# Patient Record
Sex: Female | Born: 1935
Health system: Southern US, Community
[De-identification: ages and names within clinical notes are randomized; demographics above are authoritative.]

## PROBLEM LIST (undated history)

## (undated) DIAGNOSIS — K5792 Diverticulitis of intestine, part unspecified, without perforation or abscess without bleeding: Secondary | ICD-10-CM

## (undated) DIAGNOSIS — E039 Hypothyroidism, unspecified: Secondary | ICD-10-CM

## (undated) DIAGNOSIS — N321 Vesicointestinal fistula: Secondary | ICD-10-CM

## (undated) DIAGNOSIS — M069 Rheumatoid arthritis, unspecified: Secondary | ICD-10-CM

## (undated) DIAGNOSIS — I350 Nonrheumatic aortic (valve) stenosis: Secondary | ICD-10-CM

## (undated) DIAGNOSIS — K449 Diaphragmatic hernia without obstruction or gangrene: Secondary | ICD-10-CM

## (undated) DIAGNOSIS — K589 Irritable bowel syndrome without diarrhea: Secondary | ICD-10-CM

## (undated) DIAGNOSIS — Z8619 Personal history of other infectious and parasitic diseases: Secondary | ICD-10-CM

## (undated) DIAGNOSIS — M792 Neuralgia and neuritis, unspecified: Secondary | ICD-10-CM

## (undated) DIAGNOSIS — E785 Hyperlipidemia, unspecified: Secondary | ICD-10-CM

## (undated) DIAGNOSIS — R600 Localized edema: Secondary | ICD-10-CM

## (undated) DIAGNOSIS — I4891 Unspecified atrial fibrillation: Secondary | ICD-10-CM

## (undated) DIAGNOSIS — I10 Essential (primary) hypertension: Secondary | ICD-10-CM

## (undated) DIAGNOSIS — D509 Iron deficiency anemia, unspecified: Secondary | ICD-10-CM

## (undated) HISTORY — DX: Hyperlipidemia, unspecified: E78.5

## (undated) HISTORY — DX: Rheumatoid arthritis, unspecified: M06.9

## (undated) HISTORY — DX: Irritable bowel syndrome, unspecified: K58.9

## (undated) HISTORY — PX: CATARACT EXTRACTION: SUR2

## (undated) HISTORY — PX: ENDOSCOPIC VEIN LASER TREATMENT: SHX1508

## (undated) HISTORY — PX: CARPAL TUNNEL RELEASE: SHX101

## (undated) HISTORY — DX: Iron deficiency anemia, unspecified: D50.9

## (undated) HISTORY — DX: Essential (primary) hypertension: I10

## (undated) HISTORY — DX: Diverticulitis of intestine, part unspecified, without perforation or abscess without bleeding: K57.92

## (undated) HISTORY — DX: Neuralgia and neuritis, unspecified: M79.2

## (undated) HISTORY — DX: Vesicointestinal fistula: N32.1

## (undated) HISTORY — DX: Nonrheumatic aortic (valve) stenosis: I35.0

## (undated) HISTORY — DX: Diaphragmatic hernia without obstruction or gangrene: K44.9

## (undated) HISTORY — DX: Personal history of other infectious and parasitic diseases: Z86.19

## (undated) HISTORY — DX: Hypothyroidism, unspecified: E03.9

## (undated) HISTORY — DX: Localized edema: R60.0

## (undated) NOTE — *Deleted (*Deleted)
PROGRESS NOTE    [name]  ZOX:[id number] DOB: 07-13-1927 DOA: 08/12/2020 PCP: Anne Ng, NP (Confirm with patient/family/NH records and if not entered, this HAS to be entered at [facility] point of entry. "No PCP" if truly none.)   Chief Complaint  Patient presents with  . Abdominal Pain    Brief Narrative: (Start on day 1 of progress note - keep it brief and live) ***   Assessment & Plan:   Principal Problem:   Abdominal pain Active Problems:   Aortic stenosis   Hypertension   Persistent atrial fibrillation (HCC)   Benign essential hypertension   Chronic combined systolic and diastolic congestive heart failure (HCC)   ***   DVT prophylaxis: (Lovenox/Heparin/SCD's/anticoagulated/None (if comfort care) Code Status: (Full/Partial - specify details) Family Communication: (Specify name, relationship & date discussed. NO "discussed with patient") Disposition:   Status is: Inpatient  {Inpatient:23812}  Dispo: The patient is from: {From:23814}              Anticipated d/c is to: {To:23815}              Anticipated d/c date is: {Days:23816}              Patient currently {Medically stable:23817}       Consultants:   ***  Procedures: (Don't include imaging studies which can be auto populated. Include things that cannot be auto populated i.e. Echo, Carotid and venous dopplers, Foley, Bipap, HD, tubes/drains, wound vac, central lines etc)  ***  Antimicrobials: (specify start and planned stop date. Auto populated tables are space occupying and do not give end dates)  ***    Subjective: ***  Objective: Vitals:   08/13/20 0400 08/13/20 0436 08/13/20 0825 08/13/20 1202  BP: (!) 163/89 (!) 162/112 140/67 (!) 143/68  Pulse:  100 73 83  Resp: (!) 23 20 18 18   Temp:  98.5 F (36.9 C) 98 F (36.7 C) 98.3 F (36.8 C)  TempSrc:  Oral Oral Oral  SpO2:  99%    Weight:  50.9 kg    Height:  4' 11.02" (1.499 m)      Intake/Output Summary (Last 24  hours) at 08/13/2020 1214 Last data filed at 08/13/2020 0600 Gross per 24 hour  Intake 0 ml  Output -  Net 0 ml   Filed Weights   08/12/20 2120 08/13/20 0436  Weight: 50.9 kg 50.9 kg    Examination:  General exam: Appears calm and comfortable  Respiratory system: Clear to auscultation. Respiratory effort normal. Cardiovascular system: S1 & S2 heard, RRR. No JVD, murmurs, rubs, gallops or clicks. No pedal edema. Gastrointestinal system: Abdomen is nondistended, soft and nontender. No organomegaly or masses felt. Normal bowel sounds heard. Central nervous system: Alert and oriented. No focal neurological deficits. Extremities: Symmetric 5 x 5 power. Skin: No rashes, lesions or ulcers Psychiatry: Judgement and insight appear normal. Mood & affect appropriate.     Data Reviewed: I have personally reviewed following labs and imaging studies  CBC: Recent Labs  Lab 08/07/20 0618 08/12/20 2133 08/13/20 0519  WBC 9.2 13.9* 14.6*  NEUTROABS  --  10.7* 13.0*  HGB 7.9* 9.1* 8.8*  HCT 26.6* 30.6* 29.4*  MCV 82.9 82.0 81.2  PLT 279 338 309    Basic Metabolic Panel: Recent Labs  Lab 08/07/20 0618 08/12/20 2133 08/13/20 0519  NA 139 138 139  K 4.3 4.2 4.6  CL 107 106 104  CO2 22 23 24   GLUCOSE 91 141* 176*  BUN 18 27* 25*  CREATININE 1.05* 1.08* 1.02*  CALCIUM 8.8* 8.9 8.8*    GFR: Estimated Creatinine Clearance: 24.5 mL/min (A) (by C-G formula based on SCr of 1.02 mg/dL (H)).  Liver Function Tests: Recent Labs  Lab 08/12/20 2133 08/13/20 0519  AST 38 38  ALT 22 22  ALKPHOS 87 85  BILITOT 0.6 0.7  PROT 7.8 7.5  ALBUMIN 3.6 3.5    CBG: Recent Labs  Lab 08/07/20 0731 08/12/20 2325 08/13/20 1159  GLUCAP 109* 123* 116*     Recent Results (from the past 240 hour(s))  Urine culture     Status: Abnormal   Collection Time: 08/04/20 11:19 AM   Specimen: Urine, Catheterized  Result Value Ref Range Status   Specimen Description   Final    URINE,  CATHETERIZED Performed at [facility], [address] [place], [place] [address]    Special Requests   Final    Normal Performed at [facility], [address] [place], [place] [address]    Culture >=100,000 COLONIES/mL KLEBSIELLA OXYTOCA (A)  Final   Report Status 08/06/2020 FINAL  Final   Organism ID, Bacteria KLEBSIELLA OXYTOCA (A)  Final      Susceptibility   Klebsiella oxytoca - MIC*    AMPICILLIN >=32 RESISTANT Resistant     CEFAZOLIN 8 SENSITIVE Sensitive     CEFTRIAXONE <=0.25 SENSITIVE Sensitive     CIPROFLOXACIN <=0.25 SENSITIVE Sensitive     GENTAMICIN <=1 SENSITIVE Sensitive     IMIPENEM <=0.25 SENSITIVE Sensitive     NITROFURANTOIN 32 SENSITIVE Sensitive     TRIMETH/SULFA <=20 SENSITIVE Sensitive     AMPICILLIN/SULBACTAM 8 SENSITIVE Sensitive     PIP/TAZO <=4 SENSITIVE Sensitive     * >=100,000 COLONIES/mL KLEBSIELLA OXYTOCA  Respiratory Panel by RT PCR (Flu A&B, Covid) - Nasopharyngeal Swab     Status: None   Collection Time: 08/04/20  2:58 PM   Specimen: Nasopharyngeal Swab  Result Value Ref Range Status   SARS Coronavirus 2 by RT PCR NEGATIVE NEGATIVE Final    Comment: (NOTE) SARS-CoV-2 target nucleic acids are NOT DETECTED.  The SARS-CoV-2 RNA is generally detectable in upper respiratoy specimens during the acute phase of infection. The lowest concentration of SARS-CoV-2 viral copies this assay can detect is 131 copies/mL. A negative result does not preclude SARS-Cov-2 infection and should not be used as the sole basis for treatment or other patient management decisions. A negative result may occur with  improper specimen collection/handling, submission of specimen other than nasopharyngeal swab, presence of viral mutation(s) within the areas targeted by this assay, and inadequate number of viral copies (<131 copies/mL). A negative result must be combined with clinical observations, patient history, and  epidemiological information. The expected result is Negative.  Fact Sheet for Patients:  https://www.moore.com/  Fact Sheet for Healthcare Providers:  https://www.young.biz/  This test is no t yet approved or cleared by the [place] FDA and  has been authorized for detection and/or diagnosis of SARS-CoV-2 by FDA under an Emergency Use Authorization (EUA). This EUA will remain  in effect (meaning this test can be used) for the duration of the COVID-19 declaration under Section 564(b)(1) of the Act, 21 U.S.C. section 360bbb-3(b)(1), unless the authorization is terminated or revoked sooner.     Influenza A by PCR NEGATIVE NEGATIVE Final   Influenza B by PCR NEGATIVE NEGATIVE Final    Comment: (NOTE) The Xpert Xpress SARS-CoV-2/FLU/RSV assay is intended as an aid  in  the diagnosis of influenza from Nasopharyngeal swab specimens and  should not be used as a sole basis for treatment. Nasal washings and  aspirates are unacceptable for Xpert Xpress SARS-CoV-2/FLU/RSV  testing.  Fact Sheet for Patients: https://www.moore.com/  Fact Sheet for Healthcare Providers: https://www.young.biz/  This test is not yet approved or cleared by the [place] FDA and  has been authorized for detection and/or diagnosis of SARS-CoV-2 by  FDA under an Emergency Use Authorization (EUA). This EUA will remain  in effect (meaning this test can be used) for the duration of the  Covid-19 declaration under Section 564(b)(1) of the Act, 21  U.S.C. section 360bbb-3(b)(1), unless the authorization is  terminated or revoked. Performed at [facility], [address] [place], [place] [address]   Respiratory Panel by RT PCR (Flu A&B, Covid) - Nasopharyngeal Swab     Status: None   Collection Time: 08/13/20 12:12 AM   Specimen: Nasopharyngeal Swab  Result Value Ref Range Status   SARS Coronavirus 2 by RT PCR  NEGATIVE NEGATIVE Final    Comment: (NOTE) SARS-CoV-2 target nucleic acids are NOT DETECTED.  The SARS-CoV-2 RNA is generally detectable in upper respiratoy specimens during the acute phase of infection. The lowest concentration of SARS-CoV-2 viral copies this assay can detect is 131 copies/mL. A negative result does not preclude SARS-Cov-2 infection and should not be used as the sole basis for treatment or other patient management decisions. A negative result may occur with  improper specimen collection/handling, submission of specimen other than nasopharyngeal swab, presence of viral mutation(s) within the areas targeted by this assay, and inadequate number of viral copies (<131 copies/mL). A negative result must be combined with clinical observations, patient history, and epidemiological information. The expected result is Negative.  Fact Sheet for Patients:  https://www.moore.com/  Fact Sheet for Healthcare Providers:  https://www.young.biz/  This test is no t yet approved or cleared by the [place] FDA and  has been authorized for detection and/or diagnosis of SARS-CoV-2 by FDA under an Emergency Use Authorization (EUA). This EUA will remain  in effect (meaning this test can be used) for the duration of the COVID-19 declaration under Section 564(b)(1) of the Act, 21 U.S.C. section 360bbb-3(b)(1), unless the authorization is terminated or revoked sooner.     Influenza A by PCR NEGATIVE NEGATIVE Final   Influenza B by PCR NEGATIVE NEGATIVE Final    Comment: (NOTE) The Xpert Xpress SARS-CoV-2/FLU/RSV assay is intended as an aid in  the diagnosis of influenza from Nasopharyngeal swab specimens and  should not be used as a sole basis for treatment. Nasal washings and  aspirates are unacceptable for Xpert Xpress SARS-CoV-2/FLU/RSV  testing.  Fact Sheet for Patients: https://www.moore.com/  Fact Sheet for  Healthcare Providers: https://www.young.biz/  This test is not yet approved or cleared by the [place] FDA and  has been authorized for detection and/or diagnosis of SARS-CoV-2 by  FDA under an Emergency Use Authorization (EUA). This EUA will remain  in effect (meaning this test can be used) for the duration of the  Covid-19 declaration under Section 564(b)(1) of the Act, 21  U.S.C. section 360bbb-3(b)(1), unless the authorization is  terminated or revoked. Performed at [facility], [address], [place], [place] [address]          Radiology Studies: CT ABDOMEN PELVIS WO CONTRAST  Result Date: 08/12/2020 CLINICAL DATA:  Epigastric pain which began after eating dinner EXAM: CT ABDOMEN AND PELVIS WITHOUT  CONTRAST TECHNIQUE: Multidetector CT imaging of the abdomen and pelvis was performed following the standard protocol without IV contrast. COMPARISON:  None. FINDINGS: Lower chest: Trace bilateral effusions with some adjacent atelectatic change. Lung bases otherwise clear. Cardiomegaly with predominantly right heart enlargement and extensive coronary artery calcifications. Trace pericardial fluid is likely within physiologic normal. Hypoattenuation of the cardiac blood pool compatible with a mild anemia. Hepatobiliary: No visible focal liver lesion on this unenhanced CT. Normal gallbladder and biliary tree. No gallbladder wall thickening, pericholecystic inflammation or visible calcified gallstones. Pancreas: No pancreatic ductal dilatation or surrounding inflammatory changes. Spleen: Normal in size. No concerning splenic lesions. Adrenals/Urinary Tract: Normal adrenal glands. Persistent left hydronephrosis and ureterectasis to the level of the pelvis in the vicinity of the extensive inflammatory change seen in the left lower quadrant suggestive of a possible inflammatory stricture. No right urinary tract dilatation is seen. No focal renal lesions are  evident. Mild bilateral perinephric stranding is similar to prior and nonspecific given age and diminished renal function as well as inflammatory changes in pelvis. The urinary bladder remains circumferentially though asymmetrically thickened with loss of discernible fat plane between the left superolateral bladder dome and adjacent: And a small tract of radiolucent gas compatible with colovesicular fistula. Anti dependently layering gas is again present within the bladder lumen. Margins of the posterolateral bladder also difficult to discern from the adjacent left adnexal structures as well. Stomach/Bowel: Large fluid-filled hiatal hernia, increasing distension from comparison. Some mild thickening and stranding is noted in the lower mediastinum. No free mediastinal air or fluid. Distal stomach is more unremarkable. No focal small bowel thickening or dilatation. Diffuse pancolonic diverticulosis is seen. There is redemonstration of a focal segmental thickening of the proximal to mid sigmoid colon with surrounding inflammatory phlegmon and colovesicular fistulization as detailed above. Additionally, there is persistent loss of discernible fat plane between this phlegmonous region and the multilobulated cystic structure in the left adnexa concerning for coloovarian fistula and concomitant tubo-ovarian abscess. This collection measures approximately 4.6 x 6.1, not significantly changed in size when measured at similar levels to comparison. No new fluid collections or abscesses are seen. Vascular/Lymphatic: Atherosclerotic calcifications within the abdominal aorta and branch vessels. No aneurysm or ectasia. No enlarged abdominopelvic lymph nodes. Reactive adenopathy in the deep pelvis. Reproductive: Anteverted uterus deviated slightly rightward. Inflammatory changes of the left adnexa with possible colo ovarian fistula/tubo-ovarian abscess as described above. The right ovary is less well visualized given several  adjacent collapsed small bowel loops but appears relatively quiescent. Other: Inflammatory changes and phlegmon centered in the left lower quadrant with trace amount of reactive free fluid in the deep pelvis. Mild body wall edema is present as well. Fat containing umbilical and bilateral inguinal hernias. Some focal skin thickening and soft tissue stranding superficial to the sacrum. Musculoskeletal: The osseous structures appear diffusely demineralized which may limit detection of small or nondisplaced fractures. No acute osseous abnormality or suspicious osseous lesion. Multilevel degenerative changes are present in the imaged portions of the spine. Levocurvature of the thoracolumbar spine, apex L2. Chronic anterior wedging at L2 may be degenerative or physiologic in nature. Additional degenerative changes in the hips and pelvis. IMPRESSION: 1. Redemonstration of a focal segmental thickening of the proximal to mid sigmoid colon with surrounding inflammatory phlegmon compatible with a chronic diverticulitis with demonstrable colovesicular fistula. Contiguous with the inflammation is a stable multilobulated cystic structure in the left adnexa concerning for coloovarian fistula and concomitant tubo-ovarian abscess as well. 2. More circumferential  thickening of the bladder wall likely reflecting a concomitant chronic cystitis. 3. Persistent left hydronephrosis and ureterectasis to the level of the pelvis in the vicinity of the extensive inflammatory change seen in the left lower quadrant suggestive of a possible inflammatory stricture. 4. Large fluid-filled hiatal hernia, increasing distension from comparison. Some mild thickening and stranding is noted in the lower mediastinum. Could reflect inflammation of the hiatal hernia and or esophagitis. Recommend correlation with clinical features given the acute presentation. Consider direct visualization as clinically warranted. 5. Trace bilateral effusions with some  adjacent atelectatic change. 6. Cardiomegaly with predominantly right heart enlargement and extensive coronary artery calcifications. Some features of mild anasarca as well with body wall edema. 7. Focal skin thickening superficial to the sacrum concerning for developing sacral decubitus ulceration. Recommend correlation with direct visualization. No subjacent evidence of osteomyelitis. 8. Hypoattenuation of the cardiac blood pool compatible with a mild anemia. 9. Aortic Atherosclerosis (ICD10-I70.0). Electronically Signed   By: Kreg Shropshire M.D.   On: 08/12/2020 23:42   DG Chest 2 View  Result Date: 08/12/2020 CLINICAL DATA:  Epigastric pain EXAM: CHEST - 2 VIEW COMPARISON:  04/21/2020 FINDINGS: Stable cardiomediastinal contours. Moderate-sized hiatal hernia. Atherosclerotic calcification of the aortic knob. No focal airspace consolidation, pleural effusion, or pneumothorax. Exaggerated thoracic kyphosis. IMPRESSION: No active cardiopulmonary disease. Electronically Signed   By: Duanne Guess D.O.   On: 08/12/2020 21:56        Scheduled Meds: . fentaNYL (SUBLIMAZE) injection  25 mcg Intravenous Once  . levothyroxine  37.5 mcg Intravenous Daily  . metoprolol tartrate  2.5 mg Intravenous Q6H  . pantoprazole (PROTONIX) IV  40 mg Intravenous Q12H   Continuous Infusions: . dextrose 5 % and 0.9% NaCl 75 mL/hr at 08/13/20 1007  . meropenem (MERREM) IV 500 mg (08/13/20 0547)     LOS: 0 days    Time spent: ***    Ramiro Harvest, MD [facility]   To contact the attending provider between 7A-7P or the covering provider during after hours 7P-7A, please log into the web site [email or web address] and access using universal [place] password for that web site. If you do not have the password, please call the hospital operator.  08/13/2020, 12:14 PM

---
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
## 1998-09-13 ENCOUNTER — Other Ambulatory Visit: Admission: RE | Admit: 1998-09-13 | Discharge: 1998-09-13 | Payer: Self-pay | Admitting: Family Medicine

## 1999-03-18 ENCOUNTER — Other Ambulatory Visit: Admission: RE | Admit: 1999-03-18 | Discharge: 1999-03-18 | Payer: Self-pay | Admitting: Obstetrics and Gynecology

## 2001-02-07 ENCOUNTER — Other Ambulatory Visit: Admission: RE | Admit: 2001-02-07 | Discharge: 2001-02-07 | Payer: Self-pay | Admitting: Family Medicine

## 2001-10-30 HISTORY — PX: FOOT SURGERY: SHX648

## 2003-10-31 HISTORY — PX: EYE SURGERY: SHX253

## 2004-01-19 ENCOUNTER — Other Ambulatory Visit: Admission: RE | Admit: 2004-01-19 | Discharge: 2004-01-19 | Payer: Self-pay | Admitting: Family Medicine

## 2008-07-24 ENCOUNTER — Encounter: Admission: RE | Admit: 2008-07-24 | Discharge: 2008-07-24 | Payer: Self-pay | Admitting: Family Medicine

## 2008-08-13 ENCOUNTER — Other Ambulatory Visit: Admission: RE | Admit: 2008-08-13 | Discharge: 2008-08-13 | Payer: Self-pay | Admitting: Interventional Radiology

## 2008-08-13 ENCOUNTER — Encounter (INDEPENDENT_AMBULATORY_CARE_PROVIDER_SITE_OTHER): Payer: Self-pay | Admitting: Interventional Radiology

## 2008-08-13 ENCOUNTER — Encounter: Admission: RE | Admit: 2008-08-13 | Discharge: 2008-08-13 | Payer: Self-pay | Admitting: Family Medicine

## 2009-08-19 ENCOUNTER — Emergency Department (HOSPITAL_COMMUNITY): Admission: EM | Admit: 2009-08-19 | Discharge: 2009-08-19 | Payer: Self-pay | Admitting: Emergency Medicine

## 2010-08-26 ENCOUNTER — Ambulatory Visit: Payer: Self-pay | Admitting: Cardiovascular Disease

## 2010-11-20 ENCOUNTER — Encounter: Payer: Self-pay | Admitting: Family Medicine

## 2011-02-02 LAB — BASIC METABOLIC PANEL
BUN: 18 mg/dL (ref 6–23)
CO2: 29 mEq/L (ref 19–32)
Calcium: 9.7 mg/dL (ref 8.4–10.5)
Chloride: 100 mEq/L (ref 96–112)
Creatinine, Ser: 1.24 mg/dL — ABNORMAL HIGH (ref 0.4–1.2)
GFR calc Af Amer: 50 mL/min — ABNORMAL LOW (ref >60)
GFR calc non Af Amer: 42 mL/min — ABNORMAL LOW (ref >60)
Glucose, Bld: 109 mg/dL — ABNORMAL HIGH (ref 70–99)
Potassium: 3.6 mEq/L (ref 3.5–5.1)
Sodium: 137 mEq/L (ref 135–145)

## 2011-02-02 LAB — DIFFERENTIAL
Basophils Absolute: 0 10*3/uL (ref 0.0–0.1)
Basophils Relative: 0 % (ref 0–1)
Eosinophils Absolute: 0.2 10*3/uL (ref 0.0–0.7)
Eosinophils Relative: 2 % (ref 0–5)
Lymphocytes Relative: 23 % (ref 12–46)
Lymphs Abs: 2.4 10*3/uL (ref 0.7–4.0)
Monocytes Absolute: 0.7 10*3/uL (ref 0.1–1.0)
Monocytes Relative: 6 % (ref 3–12)
Neutro Abs: 7.2 10*3/uL (ref 1.7–7.7)
Neutrophils Relative %: 69 % (ref 43–77)

## 2011-02-02 LAB — CBC
HCT: 41.2 % (ref 36.0–46.0)
Hemoglobin: 14.2 g/dL (ref 12.0–15.0)
MCHC: 34.6 g/dL (ref 30.0–36.0)
MCV: 95.5 fL (ref 78.0–100.0)
Platelets: 283 10*3/uL (ref 150–400)
RBC: 4.31 MIL/uL (ref 3.87–5.11)
RDW: 13.6 % (ref 11.5–15.5)
WBC: 10.5 10*3/uL (ref 4.0–10.5)

## 2011-02-02 LAB — PROTIME-INR
INR: 0.92 (ref 0.00–1.49)
Prothrombin Time: 12.3 seconds (ref 11.6–15.2)

## 2011-02-02 LAB — APTT: aPTT: 26 seconds (ref 24–37)

## 2011-02-17 ENCOUNTER — Other Ambulatory Visit: Payer: Self-pay | Admitting: *Deleted

## 2011-02-17 ENCOUNTER — Encounter: Payer: Self-pay | Admitting: Cardiovascular Disease

## 2011-02-17 DIAGNOSIS — E7800 Pure hypercholesterolemia, unspecified: Secondary | ICD-10-CM

## 2011-02-21 ENCOUNTER — Other Ambulatory Visit: Payer: Self-pay | Admitting: *Deleted

## 2011-02-21 ENCOUNTER — Ambulatory Visit: Payer: Self-pay | Admitting: Cardiovascular Disease

## 2011-02-21 ENCOUNTER — Telehealth: Payer: Self-pay | Admitting: *Deleted

## 2011-02-22 NOTE — Telephone Encounter (Signed)
Error open

## 2011-03-20 ENCOUNTER — Encounter: Payer: Self-pay | Admitting: Cardiovascular Disease

## 2011-03-22 ENCOUNTER — Encounter: Payer: Self-pay | Admitting: Cardiovascular Disease

## 2011-03-22 ENCOUNTER — Other Ambulatory Visit (INDEPENDENT_AMBULATORY_CARE_PROVIDER_SITE_OTHER): Payer: Medicare Other | Admitting: *Deleted

## 2011-03-22 ENCOUNTER — Ambulatory Visit (INDEPENDENT_AMBULATORY_CARE_PROVIDER_SITE_OTHER): Payer: Medicare Other | Admitting: Cardiovascular Disease

## 2011-03-22 ENCOUNTER — Other Ambulatory Visit (INDEPENDENT_AMBULATORY_CARE_PROVIDER_SITE_OTHER): Payer: Medicare Other | Admitting: Cardiology

## 2011-03-22 ENCOUNTER — Other Ambulatory Visit: Payer: Self-pay | Admitting: Cardiovascular Disease

## 2011-03-22 VITALS — BP 160/80 | HR 64 | Ht 61.0 in | Wt 154.8 lb

## 2011-03-22 DIAGNOSIS — I359 Nonrheumatic aortic valve disorder, unspecified: Secondary | ICD-10-CM

## 2011-03-22 DIAGNOSIS — I10 Essential (primary) hypertension: Secondary | ICD-10-CM

## 2011-03-22 DIAGNOSIS — I350 Nonrheumatic aortic (valve) stenosis: Secondary | ICD-10-CM

## 2011-03-22 DIAGNOSIS — E7800 Pure hypercholesterolemia, unspecified: Secondary | ICD-10-CM

## 2011-03-22 DIAGNOSIS — Z13220 Encounter for screening for lipoid disorders: Secondary | ICD-10-CM

## 2011-03-22 LAB — LDL CHOLESTEROL, DIRECT: Direct LDL: 164.2 mg/dL

## 2011-03-22 LAB — HEPATIC FUNCTION PANEL
ALT: 14 U/L (ref 0–35)
AST: 27 U/L (ref 0–37)
Albumin: 3.6 g/dL (ref 3.5–5.2)
Alkaline Phosphatase: 65 U/L (ref 39–117)
Bilirubin, Direct: 0.1 mg/dL (ref 0.0–0.3)
Total Bilirubin: 0.9 mg/dL (ref 0.3–1.2)
Total Protein: 7.1 g/dL (ref 6.0–8.3)

## 2011-03-22 LAB — LIPID PANEL
Cholesterol: 237 mg/dL — ABNORMAL HIGH (ref 0–200)
HDL: 74.8 mg/dL (ref >39.00)
Total CHOL/HDL Ratio: 3
Triglycerides: 109 mg/dL (ref 0.0–149.0)
VLDL: 21.8 mg/dL (ref 0.0–40.0)

## 2011-03-22 LAB — BASIC METABOLIC PANEL
BUN: 22 mg/dL (ref 6–23)
CO2: 29 mEq/L (ref 19–32)
Calcium: 9.4 mg/dL (ref 8.4–10.5)
Chloride: 100 mEq/L (ref 96–112)
Creatinine, Ser: 1.3 mg/dL — ABNORMAL HIGH (ref 0.4–1.2)
GFR: 43.64 mL/min — ABNORMAL LOW (ref >60.00)
Glucose, Bld: 111 mg/dL — ABNORMAL HIGH (ref 70–99)
Potassium: 4 mEq/L (ref 3.5–5.1)
Sodium: 138 mEq/L (ref 135–145)

## 2011-03-22 MED ORDER — DILTIAZEM HCL ER COATED BEADS 180 MG PO CP24
180.0000 mg | ORAL_CAPSULE | Freq: Every day | ORAL | Status: DC
Start: 2011-03-22 — End: 2012-01-31

## 2011-03-22 MED ORDER — LOSARTAN POTASSIUM-HCTZ 100-12.5 MG PO TABS: 1.0000 | ORAL_TABLET | Freq: Every day | ORAL | Status: DC

## 2011-03-22 NOTE — Telephone Encounter (Signed)
Pt called she said she was here this morning and wanted a 30 day supply of Hyzaar she gets generic. Please call to [facility] on [address]. She usually gets at [place] but cannot get there to pick up

## 2011-03-22 NOTE — Assessment & Plan Note (Signed)
Her blood pressure remains elevated. We will try diltiazem slow release 180 mg a day. This should help with her hypertension and will help keep her heart rate under control. I'll see her again in 3 months for followup visit.

## 2011-03-22 NOTE — Progress Notes (Signed)
[name] Date of Birth  1927/05/01 [facility] [address]     [address] [place], [place]  [address] [phone]  Fax  [phone]  History of Present Illness:  77 yo with history of  HTN and AS.  Under lots of stress with her husband who has dementia.  He recently fell and had a subdural hematoma.  She is very upset about his condition.   She has no symptoms.  Current Outpatient Prescriptions on File Prior to Visit  Medication Sig Dispense Refill  . Calcium Carbonate (CALCARB 600 PO) Take 3 tablets by mouth daily.        . cholecalciferol (VITAMIN D) 1000 UNITS tablet Take 2,000 Units by mouth daily.        Marland Kitchen ezetimibe-simvastatin (VYTORIN) 10-40 MG per tablet Take 1 tablet by mouth at bedtime.        Marland Kitchen levothyroxine (SYNTHROID, LEVOTHROID) 75 MCG tablet Take 75 mcg by mouth daily.        Marland Kitchen losartan-hydrochlorothiazide (HYZAAR) 100-12.5 MG per tablet Take 1 tablet by mouth daily.        . Omega-3 Fatty Acids (FISH OIL) 1200 MG CAPS Take by mouth 3 (three) times daily.          Allergies  Allergen Reactions  . Aspirin Nausea And Vomiting  . Codeine   . Penicillins Rash    Past Medical History  Diagnosis Date  . HTN (hypertension)   . HLD (hyperlipidemia)   . Hypothyroidism   . Aortic stenosis, mild     Past Surgical History  Procedure Date  . Appendectomy 1955  . Foot surgery 2003    bilateral    History  Smoking status  . Former Smoker  . Quit date: 10/31/1983  Smokeless tobacco  . Not on file    History  Alcohol Use No    Family History  Problem Relation Age of Onset  . Coronary artery disease    . Peripheral vascular disease    . Heart attack      Reviw of Systems:  Reviewed in the HPI.  All other systems are negative.  Physical Exam: BP 160/80  Pulse 64  Ht 5\' 1"  (1.549 m)  Wt 154 lb 12.8 oz (70.217 kg)  BMI 29.25 kg/m2 The patient is alert and oriented x 3.  The mood and affect are normal.   The skin is warm and dry.  Color is normal.  The HEENT exam reveals that the sclera are nonicteric.  The mucous membranes are moist.  The carotids are 2+ without bruits.  There is no thyromegaly.  There is no JVD.  The lungs are clear.  The chest wall is non tender.  The heart exam reveals a regular rate with a normal S1 and S2. There is a soft systolic murmur.  The PMI is not displaced.   Abdominal exam reveals good bowel sounds.  There is no guarding or rebound.  There is no hepatosplenomegaly or tenderness.  There are no masses.  There is trace edema - especially on the left side.  The legs are without rashes.  There are superficial spider veins and varicose veins.  The distal pulses are intact.  Cranial nerves II - XII are intact.  Motor and sensory functions are intact.  The gait is normal.  ECG:  Assessment / Plan:

## 2011-03-22 NOTE — Assessment & Plan Note (Signed)
Mrs. [name] is doing very well from a cardiac standpoint. She has not had any episodes of shortness of breath related to her aortic stenosis. She also denies any chest pain or syncope. We'll continue to follow her clinically and will repeat an echo in the next year or so.

## 2011-03-22 NOTE — Telephone Encounter (Signed)
Patient request refill. Jodette Ryna Beckstrom RN  

## 2011-03-23 ENCOUNTER — Telehealth: Payer: Self-pay | Admitting: Cardiovascular Disease

## 2011-03-23 NOTE — Telephone Encounter (Signed)
Was done yesterday. Pt didn't recognize generic name.Alfonso Ramus RN

## 2011-03-23 NOTE — Telephone Encounter (Signed)
Wants Rx for Hyzaar called in to [facility] on [address]

## 2011-03-29 ENCOUNTER — Telehealth: Payer: Self-pay | Admitting: *Deleted

## 2011-03-29 NOTE — Telephone Encounter (Signed)
Message copied by Antony Odea on Wed Mar 29, 2011 11:08 AM ------      Message from: Vesta Mixer      Created: Tue Mar 28, 2011 12:58 PM       Will she consider atorvastatin 20 mg a day in addition to the zetia.  Recheck lipids, bmp, hfp in 6 months

## 2011-03-29 NOTE — Telephone Encounter (Signed)
Pt declines adding atorvastatin at this time, has tried in past and hurts her stomach, wants to continue to try diet and exercise. Alfonso Ramus RN

## 2011-06-01 ENCOUNTER — Ambulatory Visit: Payer: Medicare Other | Admitting: Cardiovascular Disease

## 2011-09-15 ENCOUNTER — Ambulatory Visit: Payer: Medicare Other | Admitting: Cardiovascular Disease

## 2011-10-17 ENCOUNTER — Ambulatory Visit: Payer: Medicare Other | Admitting: Cardiovascular Disease

## 2011-11-03 DIAGNOSIS — K589 Irritable bowel syndrome without diarrhea: Secondary | ICD-10-CM | POA: Diagnosis not present

## 2011-11-03 DIAGNOSIS — F411 Generalized anxiety disorder: Secondary | ICD-10-CM | POA: Diagnosis not present

## 2011-11-03 DIAGNOSIS — K219 Gastro-esophageal reflux disease without esophagitis: Secondary | ICD-10-CM | POA: Diagnosis not present

## 2011-11-03 DIAGNOSIS — K2970 Gastritis, unspecified, without bleeding: Secondary | ICD-10-CM | POA: Diagnosis not present

## 2011-11-03 DIAGNOSIS — K2990 Gastroduodenitis, unspecified, without bleeding: Secondary | ICD-10-CM | POA: Diagnosis not present

## 2011-11-05 DIAGNOSIS — F419 Anxiety disorder, unspecified: Secondary | ICD-10-CM | POA: Insufficient documentation

## 2011-11-16 DIAGNOSIS — M25519 Pain in unspecified shoulder: Secondary | ICD-10-CM | POA: Diagnosis not present

## 2011-11-16 DIAGNOSIS — M67919 Unspecified disorder of synovium and tendon, unspecified shoulder: Secondary | ICD-10-CM | POA: Diagnosis not present

## 2011-11-16 DIAGNOSIS — M719 Bursopathy, unspecified: Secondary | ICD-10-CM | POA: Diagnosis not present

## 2011-11-16 DIAGNOSIS — M1710 Unilateral primary osteoarthritis, unspecified knee: Secondary | ICD-10-CM | POA: Diagnosis not present

## 2011-11-16 DIAGNOSIS — G5600 Carpal tunnel syndrome, unspecified upper limb: Secondary | ICD-10-CM | POA: Diagnosis not present

## 2011-11-20 ENCOUNTER — Telehealth: Payer: Self-pay | Admitting: Cardiovascular Disease

## 2011-11-20 NOTE — Telephone Encounter (Signed)
Pt calling re making appt to see nahser re swelling in leg, refused to see lori, nahser booked further than she wants to wait, pls advise

## 2011-11-21 NOTE — Telephone Encounter (Signed)
Spoke with pt, c/o l leg swelling for a long time but is worsening despite wearing hose. Pt husband just died and c/o being up more unable to elevate. Advised to call pcp to get into sooner. In the mean time reduce sodium, elevate when able and contact pcp soon, agreed to plan and to call back if she doesn't get assistance with pcp.

## 2011-12-04 DIAGNOSIS — Z111 Encounter for screening for respiratory tuberculosis: Secondary | ICD-10-CM | POA: Diagnosis not present

## 2011-12-21 DIAGNOSIS — Z79899 Other long term (current) drug therapy: Secondary | ICD-10-CM | POA: Diagnosis not present

## 2011-12-21 DIAGNOSIS — Z0000 Encounter for general adult medical examination without abnormal findings: Secondary | ICD-10-CM | POA: Diagnosis not present

## 2011-12-21 DIAGNOSIS — E039 Hypothyroidism, unspecified: Secondary | ICD-10-CM | POA: Diagnosis not present

## 2011-12-21 DIAGNOSIS — I10 Essential (primary) hypertension: Secondary | ICD-10-CM | POA: Diagnosis not present

## 2011-12-21 DIAGNOSIS — E559 Vitamin D deficiency, unspecified: Secondary | ICD-10-CM | POA: Diagnosis not present

## 2012-01-15 DIAGNOSIS — Z0000 Encounter for general adult medical examination without abnormal findings: Secondary | ICD-10-CM | POA: Diagnosis not present

## 2012-01-15 DIAGNOSIS — R002 Palpitations: Secondary | ICD-10-CM | POA: Diagnosis not present

## 2012-01-15 DIAGNOSIS — R609 Edema, unspecified: Secondary | ICD-10-CM | POA: Diagnosis not present

## 2012-01-15 DIAGNOSIS — I10 Essential (primary) hypertension: Secondary | ICD-10-CM | POA: Diagnosis not present

## 2012-01-15 DIAGNOSIS — F4321 Adjustment disorder with depressed mood: Secondary | ICD-10-CM | POA: Diagnosis not present

## 2012-01-17 ENCOUNTER — Ambulatory Visit (INDEPENDENT_AMBULATORY_CARE_PROVIDER_SITE_OTHER): Payer: Medicare Other | Admitting: Cardiovascular Disease

## 2012-01-17 ENCOUNTER — Encounter: Payer: Self-pay | Admitting: Cardiovascular Disease

## 2012-01-17 VITALS — BP 158/78 | HR 63 | Ht 61.0 in | Wt 149.8 lb

## 2012-01-17 DIAGNOSIS — I359 Nonrheumatic aortic valve disorder, unspecified: Secondary | ICD-10-CM | POA: Diagnosis not present

## 2012-01-17 DIAGNOSIS — R609 Edema, unspecified: Secondary | ICD-10-CM | POA: Diagnosis not present

## 2012-01-17 DIAGNOSIS — I10 Essential (primary) hypertension: Secondary | ICD-10-CM | POA: Diagnosis not present

## 2012-01-17 DIAGNOSIS — I350 Nonrheumatic aortic (valve) stenosis: Secondary | ICD-10-CM

## 2012-01-17 DIAGNOSIS — R600 Localized edema: Secondary | ICD-10-CM

## 2012-01-17 MED ORDER — VALSARTAN-HYDROCHLOROTHIAZIDE 320-25 MG PO TABS: 1.0000 | ORAL_TABLET | Freq: Every day | ORAL | Status: DC

## 2012-01-17 NOTE — Assessment & Plan Note (Signed)
Her aortic stenosis is stable.

## 2012-01-17 NOTE — Progress Notes (Signed)
[name] Date of Birth  07-26-1927 [facility] / [facility] [address]     [address] [place], [place]  [address] [phone]  Fax  [phone]  Problem list: 1. Hypertension 2. Hyperlipidemia 3. Mild aortic stenosis 4. Hypothyroidism 5. Leg edema-left greater than right  History of Present Illness:  78 yo with history of  HTN and AS.  She's been trying to avoid eating salt. Her husband died this past year. She had been spending a lot of time with him through the last year some of his life. He had severe dementia and was declining throughout the past year.  She has remained active. She's not having episodes of chest pain or shortness of breath.  Current Outpatient Prescriptions on File Prior to Visit  Medication Sig Dispense Refill  . Calcium Carbonate (CALCARB 600 PO) Take 3 tablets by mouth daily.        . cholecalciferol (VITAMIN D) 1000 UNITS tablet Take 2,000 Units by mouth daily.        Marland Kitchen ezetimibe-simvastatin (VYTORIN) 10-40 MG per tablet Take 1 tablet by mouth at bedtime.        Marland Kitchen levothyroxine (SYNTHROID, LEVOTHROID) 75 MCG tablet Take 75 mcg by mouth daily.        Marland Kitchen losartan-hydrochlorothiazide (HYZAAR) 100-12.5 MG per tablet Take 1 tablet by mouth daily.  30 tablet  11  . Omega-3 Fatty Acids (FISH OIL) 1200 MG CAPS Take by mouth 3 (three) times daily.        Marland Kitchen diltiazem (CARDIZEM CD) 180 MG 24 hr capsule Take 1 capsule (180 mg total) by mouth daily.  30 capsule  11    Allergies  Allergen Reactions  . Aspirin Nausea And Vomiting  . Codeine   . Penicillins Rash    Past Medical History  Diagnosis Date  . HTN (hypertension)   . HLD (hyperlipidemia)   . Hypothyroidism   . Aortic stenosis, moderate     Past Surgical History  Procedure Date  . Appendectomy 1955  . Foot surgery 2003    bilateral    History  Smoking status  . Former Smoker  . Quit date: 10/31/1983  Smokeless tobacco  . Not on file    History    Alcohol Use No    Family History  Problem Relation Age of Onset  . Coronary artery disease    . Peripheral vascular disease    . Heart attack      Reviw of Systems:  Reviewed in the HPI.  All other systems are negative.  Physical Exam: BP 195/76  Pulse 63  Ht 5\' 1"  (1.549 m)  Wt 149 lb 12.8 oz (67.949 kg)  BMI 28.30 kg/m2 The patient is alert and oriented x 3.  The mood and affect are normal.  The skin is warm and dry.  Color is normal.   HEENT exam reveals that the sclera are nonicteric.  The mucous membranes are moist.  The carotids are 2+ without bruits.  There is no thyromegaly.  There is no JVD.    The lungs are clear.  The chest wall is non tender.    The heart exam reveals a regular rate with a normal S1 and S2. There is a soft systolic murmur.  The PMI is not displaced.     Abdominal exam reveals good bowel sounds.  There is no guarding or rebound.  There is no hepatosplenomegaly or tenderness.  There are no masses.  There is  1+ edema -  the left side.  The legs are without rashes.  There are superficial spider veins and varicose veins.  The distal pulses are intact.    Cranial nerves II - XII are intact.  Motor and sensory functions are intact.  The gait is normal.  ECG: 01/17/2012-normal sinus rhythm. She has very minimal nonspecific ST and T wave abnormalities. There are no significant changes from her previous tracing.  Lab data from her medical Dr. 12/21/11 sodium 140 Potassium equals 4.0 Chloride equals 101. BUN equals 28 Creatinine equals 1.30  Liver function tests are normal WBC = 9.9 Hemoglobin is 13.7 Hematocrit 42.0 Platelet count 255  Total  Cholesterol = 254 Triglyceride level - 117 HCl- 83 LDL -148 TSH = 2.02 Vitamin D level = 54  Assessment / Plan:

## 2012-01-17 NOTE — Assessment & Plan Note (Signed)
Her blood pressure is elevated today. We will discontinue the losartan HCT and start her on generic Diovan HCT-320/25 mg a day. I'll see her back in 3 months for an office visit and basic metabolic profile.

## 2012-01-17 NOTE — Patient Instructions (Addendum)
Your physician recommends that you schedule a follow-up appointment in: 3 months WITH LAB BMET   Your physician has requested that you have a lower  extremity venous duplex. This test is an ultrasound of the veins in the legs or arms. It looks at venous blood flow that carries blood from the heart to the legs or arms. Allow one hour for a Lower Venous exam. Allow thirty minutes for an Upper Venous exam. There are no restrictions or special instructions.   Your physician has recommended you make the following change in your medication:   STOP LOSARTAN/ HCTZ  START DIOVAN/HCTZ 320 MG/25 MG

## 2012-01-17 NOTE — Assessment & Plan Note (Signed)
She's had significant leg edema particularly of the left leg since a trip to [place] last year. She may have developed a deep vein thrombosis. We'll check a lower the duplex scan to further evaluate this leg edema.

## 2012-01-18 ENCOUNTER — Encounter (INDEPENDENT_AMBULATORY_CARE_PROVIDER_SITE_OTHER): Payer: Medicare Other

## 2012-01-18 DIAGNOSIS — I872 Venous insufficiency (chronic) (peripheral): Secondary | ICD-10-CM | POA: Diagnosis not present

## 2012-01-18 DIAGNOSIS — R609 Edema, unspecified: Secondary | ICD-10-CM

## 2012-01-18 DIAGNOSIS — I10 Essential (primary) hypertension: Secondary | ICD-10-CM

## 2012-01-22 ENCOUNTER — Other Ambulatory Visit: Payer: Self-pay

## 2012-01-22 ENCOUNTER — Emergency Department (HOSPITAL_COMMUNITY)
Admission: EM | Admit: 2012-01-22 | Discharge: 2012-01-22 | Disposition: A | Discharge status: Home / Self Care | Payer: Medicare Other | Attending: Emergency Medicine | Admitting: Emergency Medicine

## 2012-01-22 ENCOUNTER — Encounter (HOSPITAL_COMMUNITY): Payer: Self-pay | Admitting: *Deleted

## 2012-01-22 ENCOUNTER — Emergency Department (HOSPITAL_COMMUNITY): Payer: Medicare Other

## 2012-01-22 ENCOUNTER — Telehealth: Payer: Self-pay | Admitting: Cardiovascular Disease

## 2012-01-22 DIAGNOSIS — I10 Essential (primary) hypertension: Secondary | ICD-10-CM

## 2012-01-22 DIAGNOSIS — E785 Hyperlipidemia, unspecified: Secondary | ICD-10-CM | POA: Insufficient documentation

## 2012-01-22 DIAGNOSIS — Z79899 Other long term (current) drug therapy: Secondary | ICD-10-CM | POA: Diagnosis not present

## 2012-01-22 DIAGNOSIS — E039 Hypothyroidism, unspecified: Secondary | ICD-10-CM | POA: Insufficient documentation

## 2012-01-22 DIAGNOSIS — K449 Diaphragmatic hernia without obstruction or gangrene: Secondary | ICD-10-CM | POA: Diagnosis not present

## 2012-01-22 LAB — DIFFERENTIAL
Basophils Absolute: 0 10*3/uL (ref 0.0–0.1)
Basophils Relative: 0 % (ref 0–1)
Eosinophils Absolute: 0.1 10*3/uL (ref 0.0–0.7)
Eosinophils Relative: 1 % (ref 0–5)
Lymphocytes Relative: 21 % (ref 12–46)
Lymphs Abs: 2.5 10*3/uL (ref 0.7–4.0)
Monocytes Absolute: 0.7 10*3/uL (ref 0.1–1.0)
Monocytes Relative: 6 % (ref 3–12)
Neutro Abs: 8.5 10*3/uL — ABNORMAL HIGH (ref 1.7–7.7)
Neutrophils Relative %: 73 % (ref 43–77)

## 2012-01-22 LAB — BASIC METABOLIC PANEL
BUN: 19 mg/dL (ref 6–23)
CO2: 27 mEq/L (ref 19–32)
Calcium: 9.1 mg/dL (ref 8.4–10.5)
Chloride: 91 mEq/L — ABNORMAL LOW (ref 96–112)
Creatinine, Ser: 1.07 mg/dL (ref 0.50–1.10)
GFR calc Af Amer: 54 mL/min — ABNORMAL LOW (ref >90)
GFR calc non Af Amer: 47 mL/min — ABNORMAL LOW (ref >90)
Glucose, Bld: 109 mg/dL — ABNORMAL HIGH (ref 70–99)
Potassium: 4.5 mEq/L (ref 3.5–5.1)
Sodium: 128 mEq/L — ABNORMAL LOW (ref 135–145)

## 2012-01-22 LAB — CBC
HCT: 37.1 % (ref 36.0–46.0)
Hemoglobin: 12.9 g/dL (ref 12.0–15.0)
MCH: 31.9 pg (ref 26.0–34.0)
MCHC: 34.8 g/dL (ref 30.0–36.0)
MCV: 91.8 fL (ref 78.0–100.0)
Platelets: 267 10*3/uL (ref 150–400)
RBC: 4.04 MIL/uL (ref 3.87–5.11)
RDW: 13.3 % (ref 11.5–15.5)
WBC: 11.8 10*3/uL — ABNORMAL HIGH (ref 4.0–10.5)

## 2012-01-22 LAB — TROPONIN I: Troponin I: <0.3 ng/mL (ref <0.30)

## 2012-01-22 MED ORDER — CARVEDILOL 6.25 MG PO TABS: 6.2500 mg | ORAL_TABLET | Freq: Two times a day (BID) | ORAL | Status: DC

## 2012-01-22 MED ORDER — METOPROLOL TARTRATE 25 MG PO TABS
25.0000 mg | ORAL_TABLET | Freq: Once | ORAL | Status: AC
  Administered 2012-01-22: 25 mg via ORAL
  Filled 2012-01-22: qty 1

## 2012-01-22 NOTE — Telephone Encounter (Signed)
Pt called back after exercise and after she has taken med, BP 209/106, pls advise

## 2012-01-22 NOTE — Discharge Instructions (Signed)
Arterial Hypertension Arterial hypertension (high blood pressure) is a condition of elevated pressure in your blood vessels. Hypertension over a long period of time is a risk factor for strokes, heart attacks, and heart failure. It is also the leading cause of kidney (renal) failure.  CAUSES   In Adults -- Over 90% of all hypertension has no known cause. This is called essential or primary hypertension. In the other 10% of people with hypertension, the increase in blood pressure is caused by another disorder. This is called secondary hypertension. Important causes of secondary hypertension are:   Heavy alcohol use.   Obstructive sleep apnea.   Hyperaldosterosim (Conn's syndrome).   Steroid use.   Chronic kidney failure.   Hyperparathyroidism.   Medications.   Renal artery stenosis.   Pheochromocytoma.   Cushing's disease.   Coarctation of the aorta.   Scleroderma renal crisis.   Licorice (in excessive amounts).   Drugs (cocaine, methamphetamine).  Your caregiver can explain any items above that apply to you.  In Children -- Secondary hypertension is more common and should always be considered.   Pregnancy -- Few women of childbearing age have high blood pressure. However, up to 10% of them develop hypertension of pregnancy. Generally, this will not harm the woman. It may be a sign of 3 complications of pregnancy: preeclampsia, HELLP syndrome, and eclampsia. Follow up and control with medication is necessary.  SYMPTOMS   This condition normally does not produce any noticeable symptoms. It is usually found during a routine exam.   Malignant hypertension is a late problem of high blood pressure. It may have the following symptoms:   Headaches.   Blurred vision.   End-organ damage (this means your kidneys, heart, lungs, and other organs are being damaged).   Stressful situations can increase the blood pressure. If a person with normal blood pressure has their blood  pressure go up while being seen by their caregiver, this is often termed "white coat hypertension." Its importance is not known. It may be related with eventually developing hypertension or complications of hypertension.   Hypertension is often confused with mental tension, stress, and anxiety.  DIAGNOSIS  The diagnosis is made by 3 separate blood pressure measurements. They are taken at least 1 week apart from each other. If there is organ damage from hypertension, the diagnosis may be made without repeat measurements. Hypertension is usually identified by having blood pressure readings:  Above 140/90 mmHg measured in both arms, at 3 separate times, over a couple weeks.   Over 130/80 mmHg should be considered a risk factor and may require treatment in patients with diabetes.  Blood pressure readings over 120/80 mmHg are called "pre-hypertension" even in non-diabetic patients. To get a true blood pressure measurement, use the following guidelines. Be aware of the factors that can alter blood pressure readings.  Take measurements at least 1 hour after caffeine.   Take measurements 30 minutes after smoking and without any stress. This is another reason to quit smoking - it raises your blood pressure.   Use a proper cuff size. Ask your caregiver if you are not sure about your cuff size.   Most home blood pressure cuffs are automatic. They will measure systolic and diastolic pressures. The systolic pressure is the pressure reading at the start of sounds. Diastolic pressure is the pressure at which the sounds disappear. If you are elderly, measure pressures in multiple postures. Try sitting, lying or standing.   Sit at rest for a minimum of   5 minutes before taking measurements.   You should not be on any medications like decongestants. These are found in many cold medications.   Record your blood pressure readings and review them with your caregiver.  If you have hypertension:  Your caregiver  may do tests to be sure you do not have secondary hypertension (see "causes" above).   Your caregiver may also look for signs of metabolic syndrome. This is also called Syndrome X or Insulin Resistance Syndrome. You may have this syndrome if you have type 2 diabetes, abdominal obesity, and abnormal blood lipids in addition to hypertension.   Your caregiver will take your medical and family history and perform a physical exam.   Diagnostic tests may include blood tests (for glucose, cholesterol, potassium, and kidney function), a urinalysis, or an EKG. Other tests may also be necessary depending on your condition.  PREVENTION  There are important lifestyle issues that you can adopt to reduce your chance of developing hypertension:  Maintain a normal weight.   Limit the amount of salt (sodium) in your diet.   Exercise often.   Limit alcohol intake.   Get enough potassium in your diet. Discuss specific advice with your caregiver.   Follow a DASH diet (dietary approaches to stop hypertension). This diet is rich in fruits, vegetables, and low-fat dairy products, and avoids certain fats.  PROGNOSIS  Essential hypertension cannot be cured. Lifestyle changes and medical treatment can lower blood pressure and reduce complications. The prognosis of secondary hypertension depends on the underlying cause. Many people whose hypertension is controlled with medicine or lifestyle changes can live a normal, healthy life.  RISKS AND COMPLICATIONS  While high blood pressure alone is not an illness, it often requires treatment due to its short- and long-term effects on many organs. Hypertension increases your risk for:  CVAs or strokes (cerebrovascular accident).   Heart failure due to chronically high blood pressure (hypertensive cardiomyopathy).   Heart attack (myocardial infarction).   Damage to the retina (hypertensive retinopathy).   Kidney failure (hypertensive nephropathy).  Your caregiver can  explain list items above that apply to you. Treatment of hypertension can significantly reduce the risk of complications. TREATMENT   For overweight patients, weight loss and regular exercise are recommended. Physical fitness lowers blood pressure.   Mild hypertension is usually treated with diet and exercise. A diet rich in fruits and vegetables, fat-free dairy products, and foods low in fat and salt (sodium) can help lower blood pressure. Decreasing salt intake decreases blood pressure in a 1/3 of people.   Stop smoking if you are a smoker.  The steps above are highly effective in reducing blood pressure. While these actions are easy to suggest, they are difficult to achieve. Most patients with moderate or severe hypertension end up requiring medications to bring their blood pressure down to a normal level. There are several classes of medications for treatment. Blood pressure pills (antihypertensives) will lower blood pressure by their different actions. Lowering the blood pressure by 10 mmHg may decrease the risk of complications by as much as 25%. The goal of treatment is effective blood pressure control. This will reduce your risk for complications. Your caregiver will help you determine the best treatment for you according to your lifestyle. What is excellent treatment for one person, may not be for you. HOME CARE INSTRUCTIONS   Do not smoke.   Follow the lifestyle changes outlined in the "Prevention" section.   If you are on medications, follow the directions   carefully. Blood pressure medications must be taken as prescribed. Skipping doses reduces their benefit. It also puts you at risk for problems.   Follow up with your caregiver, as directed.   If you are asked to monitor your blood pressure at home, follow the guidelines in the "Diagnosis" section above.  SEEK MEDICAL CARE IF:   You think you are having medication side effects.   You have recurrent headaches or lightheadedness.     You have swelling in your ankles.   You have trouble with your vision.  SEEK IMMEDIATE MEDICAL CARE IF:   You have sudden onset of chest pain or pressure, difficulty breathing, or other symptoms of a heart attack.   You have a severe headache.   You have symptoms of a stroke (such as sudden weakness, difficulty speaking, difficulty walking).  MAKE SURE YOU:   Understand these instructions.   Will watch your condition.   Will get help right away if you are not doing well or get worse.  Document Released: 10/16/2005 Document Revised: 10/05/2011 Document Reviewed: 05/16/2007 [employer] Patient Information 2012 [employer]. 

## 2012-01-22 NOTE — ED Notes (Signed)
Unable to obtain blood from IV 

## 2012-01-22 NOTE — Telephone Encounter (Signed)
Spoke with pt/ upset with BP results, took med one hour ago and BP not coming down. Pt wants to go to er and is going now, pt doesn't have any PRN HTN med's to have her take, advised to proceed to ER.

## 2012-01-22 NOTE — Telephone Encounter (Signed)
New message:  Pt b/p is 170/100 and she wants to know is it ok for her to go to gym or not

## 2012-01-22 NOTE — Telephone Encounter (Signed)
Pt has been on hctz prior and has taken three doses of diovan prior to these symptoms, pt took bp yesterday when this happened and it was 140/72. Denies CP and is feeling fine today, heading to exercise class. Pt wants to try med again when she getrs back from exercise because she said it has really helped with the fluid in her legs. Pt told to stay hydrated and call me back today or tomorrow with additional symptoms, pt agreed to plan.

## 2012-01-22 NOTE — ED Notes (Signed)
Pt reports HTN at PCP last Tues, continued Thurs at cardiologist. High this am, was told by cardiologist to go to gym for regular work out, walked a mile, continued HA.

## 2012-01-22 NOTE — ED Notes (Signed)
[id number]<AV> Expected date:<BR> Expected time:<BR> Means of arrival:<BR> Comments:<BR> [place]

## 2012-01-22 NOTE — Telephone Encounter (Signed)
Pt would like her ultrasound results and she was given hydrochlorothiazide and had a reaction of blurred vision, diarrhea, dry heaves and she needs to talk to someone about it

## 2012-01-22 NOTE — ED Notes (Signed)
Pt to radiology.

## 2012-01-22 NOTE — Telephone Encounter (Signed)
Yes. It will be fine

## 2012-01-22 NOTE — Telephone Encounter (Signed)
Pt called and informed.

## 2012-01-22 NOTE — ED Provider Notes (Signed)
History     CSN: [id number]  Arrival date & time 01/22/12  1534   First MD Initiated Contact with Patient 01/22/12 1629      Chief Complaint  Patient presents with  . Hypertension    (Consider location/radiation/quality/duration/timing/severity/associated sxs/prior treatment) HPI Pt states she came to the ED because her BP was high.  Pt saw her PCP and cardiologist last week.  It was high at that time as well.  Pt is not sure how high it was.  Pt had been taking hyzar and had it switched last Thursday.  She denies any chest pain or shortness of breath.  No headache or dizziness.  No abdominal pain.  Pt called her doctor and was told to come to the ED. Past Medical History  Diagnosis Date  . HTN (hypertension)   . HLD (hyperlipidemia)   . Hypothyroidism   . Aortic stenosis, moderate     Past Surgical History  Procedure Date  . Appendectomy 1955  . Foot surgery 2003    bilateral    Family History  Problem Relation Age of Onset  . Coronary artery disease    . Peripheral vascular disease    . Heart attack      History  Substance Use Topics  . Smoking status: Former Smoker    Quit date: 10/31/1983  . Smokeless tobacco: Not on file  . Alcohol Use: No    OB History    Grav Para Term Preterm Abortions TAB SAB Ect Mult Living                  Review of Systems  Allergies  Aspirin; Codeine; and Penicillins  Home Medications   Current Outpatient Rx  Name Route Sig Dispense Refill  . CALCIUM CARBONATE 600 MG PO TABS Oral Take 1,800 mg by mouth daily.    Marland Kitchen VITAMIN D 1000 UNITS PO TABS Oral Take 2,000 Units by mouth daily.      Marland Kitchen CLONAZEPAM 0.5 MG PO TABS Oral Take 0.5 mg by mouth 2 (two) times daily as needed. For anxiety.    Marland Kitchen DICYCLOMINE HCL 20 MG PO TABS Oral Take 20 mg by mouth every 6 (six) hours as needed. For stomach irritability.    Marland Kitchen DILTIAZEM HCL ER COATED BEADS 180 MG PO CP24 Oral Take 1 capsule (180 mg total) by mouth daily. 30 capsule 11  . ESTROGENS  CONJUGATED 0.625 MG PO TABS Oral Take 0.625 mg by mouth daily. Take daily for 26 days then do not take for 4-5 days.    Marland Kitchen EZETIMIBE-SIMVASTATIN 10-40 MG PO TABS Oral Take 1 tablet by mouth at bedtime.      . OMEGA-3 FATTY ACIDS 1000 MG PO CAPS Oral Take 1 g by mouth 3 (three) times daily.    Marland Kitchen LEVOTHYROXINE SODIUM 75 MCG PO TABS Oral Take 75 mcg by mouth daily.      Marland Kitchen MEDROXYPROGESTERONE ACETATE 5 MG PO TABS Oral Take 5 mg by mouth daily.    Marland Kitchen VITAMIN B-6 100 MG PO TABS Oral Take 100 mg by mouth daily.    Marland Kitchen VITAMIN C 500 MG PO TABS Oral Take 500 mg by mouth daily.    Marland Kitchen VITAMIN E 400 UNITS PO CAPS Oral Take 400 Units by mouth daily.      BP 232/101  Pulse 87  Temp(Src) 98.4 F (36.9 C) (Oral)  Resp 16  SpO2 100%  Physical Exam  ED Course  Procedures (including critical care time)  Date:  01/22/2012  Rate: 84  Rhythm: sinus arrhythmia  QRS Axis: normal  Intervals: normal  ST/T Wave abnormalities: normal  Conduction Disutrbances:none  Narrative Interpretation:   Old EKG Reviewed: none available   Labs Reviewed  CBC - Abnormal; Notable for the following:    WBC 11.8 (*)    All other components within normal limits  DIFFERENTIAL - Abnormal; Notable for the following:    Neutro Abs 8.5 (*)    All other components within normal limits  BASIC METABOLIC PANEL - Abnormal; Notable for the following:    Sodium 128 (*)    Chloride 91 (*)    Glucose, Bld 109 (*)    GFR calc non Af Amer 47 (*)    GFR calc Af Amer 54 (*)    All other components within normal limits  TROPONIN I   Dg Chest 2 View  01/22/2012  *RADIOLOGY REPORT*  Clinical Data: Hypertension  CHEST - 2 VIEW  Comparison: None  Findings: Heart size is mildly enlarged.  No pleural effusion or edema show.  There is a moderate size hiatal hernia identified.  No airspace consolidation.  IMPRESSION:  1.  No acute cardiopulmonary abnormalities. 2.  Hiatal hernia.  Original Report Authenticated By: Rosealee Albee, M.D.       MDM  Pt presents with asymptomatic hypertension.  No evidence of acute emergency medical condition.  BP has decreased somewhat with the metoprolol.  Pt had her medications adjusted this week.   Discussed with Dr [name].  Will add carvedilol 6.25 mg po bid.  She will follow up in the office this week to be rechecked.       Celene Kras, MD 01/22/12 [phone]

## 2012-01-22 NOTE — ED Notes (Signed)
Pt took Diovan approx 1330.

## 2012-01-23 NOTE — Telephone Encounter (Signed)
FU Call: Pt calling wanting to speak with nurse about scheduling appt to see MD this week. Pt went to ER yesterday for pt BP and was instructed to FU with MD this week. Dr. [name] does not have any available appointments this week. Please return pt call to discuss further.

## 2012-01-23 NOTE — Telephone Encounter (Signed)
App made for tomorrow with NP Tyrone Sage, pt accepting of plan.

## 2012-01-24 ENCOUNTER — Ambulatory Visit: Payer: Medicare Other | Admitting: Nurse Practitioner

## 2012-01-31 ENCOUNTER — Encounter: Payer: Self-pay | Admitting: Nurse Practitioner

## 2012-01-31 ENCOUNTER — Telehealth: Payer: Self-pay | Admitting: Cardiovascular Disease

## 2012-01-31 ENCOUNTER — Ambulatory Visit (INDEPENDENT_AMBULATORY_CARE_PROVIDER_SITE_OTHER): Payer: Medicare Other | Admitting: Nurse Practitioner

## 2012-01-31 VITALS — BP 200/90 | HR 70 | Ht 61.0 in | Wt 152.0 lb

## 2012-01-31 DIAGNOSIS — R609 Edema, unspecified: Secondary | ICD-10-CM | POA: Diagnosis not present

## 2012-01-31 DIAGNOSIS — R600 Localized edema: Secondary | ICD-10-CM

## 2012-01-31 DIAGNOSIS — I10 Essential (primary) hypertension: Secondary | ICD-10-CM | POA: Diagnosis not present

## 2012-01-31 DIAGNOSIS — I359 Nonrheumatic aortic valve disorder, unspecified: Secondary | ICD-10-CM | POA: Diagnosis not present

## 2012-01-31 DIAGNOSIS — I350 Nonrheumatic aortic (valve) stenosis: Secondary | ICD-10-CM

## 2012-01-31 MED ORDER — OLMESARTAN MEDOXOMIL 40 MG PO TABS: 40.0000 mg | ORAL_TABLET | Freq: Every day | ORAL | Status: DC

## 2012-01-31 MED ORDER — FUROSEMIDE 40 MG PO TABS: 40.0000 mg | ORAL_TABLET | Freq: Every day | ORAL | Status: DC

## 2012-01-31 NOTE — Telephone Encounter (Signed)
Last month she thought her bp med Losartan/ HCTZ was making her sick. She was taken off med and was sent to ER / pt wanted to be seen then and went on own acord for HTN. Er doctor started her on carvedilol 6.25 mg BID, She called today stating her BP is 180/95 and states she never started the coreg that was prescribed in the ER. She wants to possibly start to try again the losartan/hctz that she stated last month made her ill. She seems agitated and unsure of meds this morning, app made with Norma Fredrickson NP to clarify meds. Pt accepting of plan.

## 2012-01-31 NOTE — Assessment & Plan Note (Signed)
Blood pressure remains uncontrolled. Sodium was low at her visit to the ER. I have stopped the HCTZ. I have put her on Benicar 40 mg daily and have given her a sample bottle from the office. She is to start the Coreg at 6.25 mg BID as already prescribed. We will put her on Lasix 40 mg in the place of the HCTZ. We are rechecking her labs today. She will continue to monitor her blood pressure at home. I will see her back early next week. She is to minimize her salt. Patient is agreeable to this plan and will call if any problems develop in the interim.

## 2012-01-31 NOTE — Assessment & Plan Note (Signed)
No symptoms reported. May need to consider rechecking her echo on a return visit. Will defer to Dr. [name].

## 2012-01-31 NOTE — Patient Instructions (Signed)
Take the Coreg 6.25 mg two times a day - take your first dose tonight and then two times a day starting tomorrow  Take the Benicar 40 mg each morning.  Watch your salt  We are going to recheck your labs today.  I will see you next Tuesday or Wednesday  Call the [facility] office at [phone] if you have any questions, problems or concerns.

## 2012-01-31 NOTE — Progress Notes (Signed)
[name] Date of Birth: 1927/08/25 Medical Record #[id number]  History of Present Illness: [name] is seen today for a work in visit. She is seen for Dr. [name]. She is an 78 year old female with a history of HTN, HLD and aortic stenosis. Last echo was in 2010.   She comes in today. She is here alone. She is complaining of her blood pressure being too high. She saw Dr. [name] last month. She had her Hyzaar switched to generic Diovan HCT. She only took a couple of doses. [place] it made her sick. Then was in the ER with grossly elevated blood pressure. Sodium was 128 on her labs. Coreg was prescribed but she never started. She called and talked with Alvino Chapel earlier today. She was wanting to get back on her Hyzaar. She actually took a dose of her Diovan Hct. She has still not started the Coreg. She continues to have issues with swelling in her legs, left worse than right. This has been a chronic issue. She has had a recent negative doppler for DVT. Her blood pressure is still elevated. She has had some headaches. No nose bleeds, but has had in the past. No chest pain. Does have DOE that seems to be chronic. Her salt use seems questionable to me. She does eat out. She admits that she has had lots of stress in trying to settle her husband's estate.   Current Outpatient Prescriptions on File Prior to Visit  Medication Sig Dispense Refill  . calcium carbonate (OS-CAL) 600 MG TABS Take 1,800 mg by mouth daily.      . carvedilol (COREG) 6.25 MG tablet Take 1 tablet (6.25 mg total) by mouth 2 (two) times daily with a meal.  30 tablet  1  . cholecalciferol (VITAMIN D) 1000 UNITS tablet Take 2,000 Units by mouth daily.        . clonazePAM (KLONOPIN) 0.5 MG tablet Take 0.5 mg by mouth 2 (two) times daily as needed. For anxiety.      . dicyclomine (BENTYL) 20 MG tablet Take 20 mg by mouth every 6 (six) hours as needed. For stomach irritability.      Marland Kitchen estrogens, conjugated, (PREMARIN) 0.625 MG tablet Take  0.625 mg by mouth daily. Take daily for 26 days then do not take for 4-5 days.      Marland Kitchen ezetimibe-simvastatin (VYTORIN) 10-40 MG per tablet Take 1 tablet by mouth at bedtime.        . fish oil-omega-3 fatty acids 1000 MG capsule Take 1 g by mouth 3 (three) times daily.      Marland Kitchen levothyroxine (SYNTHROID, LEVOTHROID) 75 MCG tablet Take 75 mcg by mouth daily.        . medroxyPROGESTERone (PROVERA) 5 MG tablet Take 5 mg by mouth daily.      Marland Kitchen pyridOXINE (VITAMIN B-6) 100 MG tablet Take 100 mg by mouth daily.      . vitamin C (ASCORBIC ACID) 500 MG tablet Take 500 mg by mouth daily.      . vitamin E 400 UNIT capsule Take 400 Units by mouth daily.      Marland Kitchen DISCONTD: losartan-hydrochlorothiazide (HYZAAR) 100-12.5 MG per tablet Take 1 tablet by mouth daily.  30 tablet  11    Allergies  Allergen Reactions  . Aspirin Nausea And Vomiting  . Codeine   . Penicillins Rash    Past Medical History  Diagnosis Date  . HTN (hypertension)   . HLD (hyperlipidemia)   . Hypothyroidism   .  Aortic stenosis, moderate     Past Surgical History  Procedure Date  . Appendectomy 1955  . Foot surgery 2003    bilateral    History  Smoking status  . Former Smoker  . Quit date: 10/31/1983  Smokeless tobacco  . Not on file    History  Alcohol Use No    Family History  Problem Relation Age of Onset  . Coronary artery disease    . Peripheral vascular disease    . Heart attack      Review of Systems: The review of systems is per the HPI.  All other systems were reviewed and are negative.  Physical Exam: BP 202/110  Pulse 70  Ht 5\' 1"  (1.549 m)  Wt 152 lb (68.947 kg)  BMI 28.72 kg/m2 Patient is pleasant and in no acute distress. She is a little anxious today.  Skin is warm and dry. Color is normal.  HEENT is unremarkable. Normocephalic/atraumatic. PERRL. Sclera are nonicteric. Neck is supple. No masses. No JVD. Lungs are clear. Cardiac exam shows a regular rate and rhythm. Soft outflow murmur noted.  Abdomen is soft. Extremities are with edema, left greater than right. Gait and ROM are intact. No gross neurologic deficits noted.  LABORATORY DATA: Lab Results  Component Value Date   WBC 11.8* 01/22/2012   HGB 12.9 01/22/2012   HCT 37.1 01/22/2012   PLT 267 01/22/2012   GLUCOSE 109* 01/22/2012   CHOL 237* 03/22/2011   TRIG 109.0 03/22/2011   HDL 74.80 03/22/2011   LDLDIRECT 164.2 03/22/2011   ALT 14 03/22/2011   AST 27 03/22/2011   NA 128* 01/22/2012   K 4.5 01/22/2012   CL 91* 01/22/2012   CREATININE 1.07 01/22/2012   BUN 19 01/22/2012   CO2 27 01/22/2012   INR 0.92 08/19/2009     Assessment / Plan:

## 2012-01-31 NOTE — Telephone Encounter (Signed)
Please return call to patient at [phone]  Patient has questions about Lopressor medication, please return call to patient at [phone]. Patient is waiting on call to take her meds this morning.

## 2012-01-31 NOTE — Assessment & Plan Note (Signed)
This is a chronic issue. I have switched her to Lasix. She has had a recent negative doppler for DVT noted.

## 2012-02-01 LAB — BASIC METABOLIC PANEL
BUN: 23 mg/dL (ref 6–23)
CO2: 27 mEq/L (ref 19–32)
Calcium: 9.4 mg/dL (ref 8.4–10.5)
Chloride: 101 mEq/L (ref 96–112)
Creatinine, Ser: 1.1 mg/dL (ref 0.4–1.2)
GFR: 52.67 mL/min — ABNORMAL LOW (ref >60.00)
Glucose, Bld: 92 mg/dL (ref 70–99)
Potassium: 4.2 mEq/L (ref 3.5–5.1)
Sodium: 138 mEq/L (ref 135–145)

## 2012-02-06 ENCOUNTER — Ambulatory Visit (INDEPENDENT_AMBULATORY_CARE_PROVIDER_SITE_OTHER): Payer: Medicare Other | Admitting: Nurse Practitioner

## 2012-02-06 ENCOUNTER — Encounter: Payer: Self-pay | Admitting: Nurse Practitioner

## 2012-02-06 ENCOUNTER — Telehealth: Payer: Self-pay | Admitting: *Deleted

## 2012-02-06 VITALS — BP 110/68 | HR 60 | Ht 61.0 in | Wt 150.0 lb

## 2012-02-06 DIAGNOSIS — R600 Localized edema: Secondary | ICD-10-CM

## 2012-02-06 DIAGNOSIS — I10 Essential (primary) hypertension: Secondary | ICD-10-CM

## 2012-02-06 DIAGNOSIS — E870 Hyperosmolality and hypernatremia: Secondary | ICD-10-CM

## 2012-02-06 DIAGNOSIS — I359 Nonrheumatic aortic valve disorder, unspecified: Secondary | ICD-10-CM

## 2012-02-06 DIAGNOSIS — R609 Edema, unspecified: Secondary | ICD-10-CM | POA: Diagnosis not present

## 2012-02-06 DIAGNOSIS — I350 Nonrheumatic aortic (valve) stenosis: Secondary | ICD-10-CM

## 2012-02-06 LAB — BASIC METABOLIC PANEL
BUN: 37 mg/dL — ABNORMAL HIGH (ref 6–23)
CO2: 32 mEq/L (ref 19–32)
Calcium: 8.6 mg/dL (ref 8.4–10.5)
Chloride: 99 mEq/L (ref 96–112)
Creatinine, Ser: 1.6 mg/dL — ABNORMAL HIGH (ref 0.4–1.2)
GFR: 31.83 mL/min — ABNORMAL LOW (ref >60.00)
Glucose, Bld: 101 mg/dL — ABNORMAL HIGH (ref 70–99)
Potassium: 4 mEq/L (ref 3.5–5.1)
Sodium: 140 mEq/L (ref 135–145)

## 2012-02-06 MED ORDER — FUROSEMIDE 20 MG PO TABS: 20.0000 mg | ORAL_TABLET | Freq: Every day | ORAL | Status: DC

## 2012-02-06 NOTE — Progress Notes (Signed)
[name] Date of Birth: April 21, 1927 Medical Record #[id number]  History of Present Illness: [name] is seen back today for a follow up visit. She is seen for Dr. [name]. She has HTN, HLD and aortic stenosis. Last echo was in 2010.  When I saw her last week, she was terribly hypertensive. We have changed her medicines. She has chronic swelling of her legs, more so on the the left. We stopped her HCTZ and put her on Lasix.   She comes in today. She says she is doing "fantastic". She is quite happy with her blood pressure. It has trended down nicely with the Benicar and Coreg. Her edema has improved with the Lasix. She has no complaint. She is using support stockings more. She seems to be less stressed as well.  Current Outpatient Prescriptions on File Prior to Visit  Medication Sig Dispense Refill  . calcium carbonate (OS-CAL) 600 MG TABS Take 1,800 mg by mouth daily.      . carvedilol (COREG) 6.25 MG tablet Take 1 tablet (6.25 mg total) by mouth 2 (two) times daily with a meal.  30 tablet  1  . cholecalciferol (VITAMIN D) 1000 UNITS tablet Take 2,000 Units by mouth daily.        . clonazePAM (KLONOPIN) 0.5 MG tablet Take 0.5 mg by mouth 2 (two) times daily as needed. For anxiety.      . dicyclomine (BENTYL) 20 MG tablet Take 20 mg by mouth every 6 (six) hours as needed. For stomach irritability.      Marland Kitchen estrogens, conjugated, (PREMARIN) 0.625 MG tablet Take 0.625 mg by mouth daily. Take daily for 26 days then do not take for 4-5 days.      Marland Kitchen ezetimibe-simvastatin (VYTORIN) 10-40 MG per tablet Take 1 tablet by mouth at bedtime.        . fish oil-omega-3 fatty acids 1000 MG capsule Take 1 g by mouth 3 (three) times daily.      . furosemide (LASIX) 40 MG tablet Take 1 tablet (40 mg total) by mouth daily.  30 tablet  11  . levothyroxine (SYNTHROID, LEVOTHROID) 75 MCG tablet Take 75 mcg by mouth daily.        . medroxyPROGESTERone (PROVERA) 5 MG tablet Take 5 mg by mouth daily.      Marland Kitchen  olmesartan (BENICAR) 40 MG tablet Take 1 tablet (40 mg total) by mouth daily.  30 tablet  11  . pyridOXINE (VITAMIN B-6) 100 MG tablet Take 100 mg by mouth daily.      . vitamin C (ASCORBIC ACID) 500 MG tablet Take 500 mg by mouth daily.      . vitamin E 400 UNIT capsule Take 400 Units by mouth daily.      Marland Kitchen DISCONTD: losartan-hydrochlorothiazide (HYZAAR) 100-12.5 MG per tablet Take 1 tablet by mouth daily.  30 tablet  11    Allergies  Allergen Reactions  . Aspirin Nausea And Vomiting  . Codeine   . Penicillins Rash    Past Medical History  Diagnosis Date  . HTN (hypertension)   . HLD (hyperlipidemia)   . Hypothyroidism   . Aortic stenosis, moderate     last echo in 2010; AVA 1.1  . Edema of both legs     Past Surgical History  Procedure Date  . Appendectomy 1955  . Foot surgery 2003    bilateral    History  Smoking status  . Former Smoker  . Quit date: 10/31/1983  Smokeless tobacco  .  Not on file    History  Alcohol Use No    Family History  Problem Relation Age of Onset  . Coronary artery disease    . Peripheral vascular disease    . Heart attack      Review of Systems: The review of systems is positive for chronic edema of the legs, more so on the left.  All other systems were reviewed and are negative.  Physical Exam: BP 110/68  Pulse 60  Ht 5\' 1"  (1.549 m)  Wt 150 lb (68.04 kg)  BMI 28.34 kg/m2 Patient is very pleasant and in no acute distress. Skin is warm and dry. Color is normal.  HEENT is unremarkable. Normocephalic/atraumatic. PERRL. Sclera are nonicteric. Neck is supple. No masses. No JVD. Lungs are clear. Cardiac exam shows a regular rate and rhythm. Soft outflow murmur. Abdomen is soft. Extremities are with 1+ edema on the left. Gait and ROM are intact. No gross neurologic deficits noted.  LABORATORY DATA: PENDING   Assessment / Plan:

## 2012-02-06 NOTE — Assessment & Plan Note (Signed)
Her blood pressure has improved significantly with her current regimen. She feels good. No further changes were made. We will see her back in about 3 months. Patient is agreeable to this plan and will call if any problems develop in the interim.

## 2012-02-06 NOTE — Telephone Encounter (Signed)
Message copied by Antony Odea on Tue Feb 06, 2012  3:59 PM ------      Message from: Rosalio Macadamia      Created: Tue Feb 06, 2012  1:25 PM       Ok to report. Needs to cut her lasix back to just 20 mg. Recheck BMET in 2 weeks.

## 2012-02-06 NOTE — Telephone Encounter (Signed)
Called lab results, med reduced, pt to cut tablet in half till out then get new strength, lab app set.

## 2012-02-06 NOTE — Patient Instructions (Signed)
Stay on your current medicines.  We are going to recheck your potassium level today  We will see you in 3 months.  Call the [facility] office at [phone] if you have any questions, problems or concerns.

## 2012-02-06 NOTE — Assessment & Plan Note (Signed)
No cardinal symptoms reported. Would consider rechecking echo on return visit later this summer. Will defer to Dr. [name].

## 2012-02-06 NOTE — Assessment & Plan Note (Signed)
This has improved with support stockings and Lasix. She has had a recent negative doppler study on the left leg to rule out DVT.

## 2012-02-09 DIAGNOSIS — H26499 Other secondary cataract, unspecified eye: Secondary | ICD-10-CM | POA: Diagnosis not present

## 2012-02-09 DIAGNOSIS — Z961 Presence of intraocular lens: Secondary | ICD-10-CM | POA: Diagnosis not present

## 2012-02-09 DIAGNOSIS — H43399 Other vitreous opacities, unspecified eye: Secondary | ICD-10-CM | POA: Diagnosis not present

## 2012-02-09 DIAGNOSIS — H353190 Nonexudative age-related macular degeneration, unspecified eye, stage unspecified: Secondary | ICD-10-CM | POA: Diagnosis not present

## 2012-02-14 DIAGNOSIS — M25519 Pain in unspecified shoulder: Secondary | ICD-10-CM | POA: Diagnosis not present

## 2012-02-15 ENCOUNTER — Ambulatory Visit: Payer: Medicare Other | Admitting: Cardiovascular Disease

## 2012-02-22 ENCOUNTER — Other Ambulatory Visit (INDEPENDENT_AMBULATORY_CARE_PROVIDER_SITE_OTHER): Payer: Medicare Other

## 2012-02-22 DIAGNOSIS — I10 Essential (primary) hypertension: Secondary | ICD-10-CM | POA: Diagnosis not present

## 2012-02-22 DIAGNOSIS — E870 Hyperosmolality and hypernatremia: Secondary | ICD-10-CM

## 2012-02-22 LAB — BASIC METABOLIC PANEL
BUN: 21 mg/dL (ref 6–23)
CO2: 24 mEq/L (ref 19–32)
Calcium: 8.7 mg/dL (ref 8.4–10.5)
Chloride: 104 mEq/L (ref 96–112)
Creatinine, Ser: 1.3 mg/dL — ABNORMAL HIGH (ref 0.4–1.2)
GFR: 41.98 mL/min — ABNORMAL LOW (ref >60.00)
Glucose, Bld: 121 mg/dL — ABNORMAL HIGH (ref 70–99)
Potassium: 3.8 mEq/L (ref 3.5–5.1)
Sodium: 139 mEq/L (ref 135–145)

## 2012-03-08 ENCOUNTER — Encounter: Payer: Self-pay | Admitting: Cardiovascular Disease

## 2012-03-11 ENCOUNTER — Telehealth: Payer: Self-pay | Admitting: Cardiovascular Disease

## 2012-03-11 DIAGNOSIS — R600 Localized edema: Secondary | ICD-10-CM

## 2012-03-11 NOTE — Telephone Encounter (Signed)
Scripts written and sent for lasix carvedilol benicar.

## 2012-03-11 NOTE — Telephone Encounter (Signed)
Please return call to patient regarding hardcopies of prescription for [facility], she can be reached at [phone].

## 2012-03-12 DIAGNOSIS — Z79899 Other long term (current) drug therapy: Secondary | ICD-10-CM | POA: Diagnosis not present

## 2012-03-12 DIAGNOSIS — I10 Essential (primary) hypertension: Secondary | ICD-10-CM | POA: Diagnosis not present

## 2012-03-14 NOTE — Telephone Encounter (Signed)
New Problem:     Patient would like to be referred to see a Vein specialist because of the swelling in her left leg.  Please call back.

## 2012-03-14 NOTE — Telephone Encounter (Signed)
Referral placed for consult with vascular.

## 2012-03-18 NOTE — Telephone Encounter (Signed)
Appointment with Dr. [name] 03/19/12 @ 1:30.

## 2012-03-21 DIAGNOSIS — E785 Hyperlipidemia, unspecified: Secondary | ICD-10-CM | POA: Diagnosis not present

## 2012-03-21 DIAGNOSIS — R609 Edema, unspecified: Secondary | ICD-10-CM | POA: Diagnosis not present

## 2012-03-21 DIAGNOSIS — E079 Disorder of thyroid, unspecified: Secondary | ICD-10-CM | POA: Diagnosis not present

## 2012-03-21 DIAGNOSIS — G4700 Insomnia, unspecified: Secondary | ICD-10-CM | POA: Diagnosis not present

## 2012-04-03 ENCOUNTER — Telehealth: Payer: Self-pay | Admitting: Cardiovascular Disease

## 2012-04-03 NOTE — Telephone Encounter (Signed)
She should elevate her legs higher than her heart.  I am booked up for the next 2 days ( double and triple booked in some slots) and will be on vacation next week.  Perhaps, Melony Overly, or Lorin Picket can see her.

## 2012-04-03 NOTE — Telephone Encounter (Signed)
Patient aware of MD's recommendations. Patient has an appointment with Norma Fredrickson NP on 04/10/12 at 0800. Patient aware.

## 2012-04-03 NOTE — Telephone Encounter (Signed)
Patient states that  her left leg is extremely swollen. She had to cut her stockings off  last night. Pt's vascular appointment is on 04/19/12 Pt would like to be seen sooner. Patient said she had gone to Vascular office to see if the appointment could be move sooner, but the office appointment desk person refused to do so. Patient would like to seen if Dr. [name] can call the office and see if she can be seen sooner.  [facility] Phone # is  [phone].

## 2012-04-03 NOTE — Telephone Encounter (Signed)
Please return call to patient at [phone]  Patient has appnt 6/21 with [facility], she demands the appnt be moved up sooner as her legs continue to swell and something has to be done.

## 2012-04-04 ENCOUNTER — Ambulatory Visit: Payer: Medicare Other | Admitting: Cardiovascular Disease

## 2012-04-04 ENCOUNTER — Other Ambulatory Visit: Payer: Medicare Other

## 2012-04-10 ENCOUNTER — Ambulatory Visit (HOSPITAL_COMMUNITY): Payer: Medicare Other | Attending: Cardiovascular Disease | Admitting: Radiology

## 2012-04-10 ENCOUNTER — Ambulatory Visit (INDEPENDENT_AMBULATORY_CARE_PROVIDER_SITE_OTHER): Payer: Medicare Other | Admitting: Nurse Practitioner

## 2012-04-10 ENCOUNTER — Encounter: Payer: Self-pay | Admitting: Nurse Practitioner

## 2012-04-10 VITALS — BP 162/84 | HR 66 | Ht 61.0 in | Wt 156.6 lb

## 2012-04-10 DIAGNOSIS — R600 Localized edema: Secondary | ICD-10-CM

## 2012-04-10 DIAGNOSIS — Z87891 Personal history of nicotine dependence: Secondary | ICD-10-CM | POA: Diagnosis not present

## 2012-04-10 DIAGNOSIS — E785 Hyperlipidemia, unspecified: Secondary | ICD-10-CM | POA: Insufficient documentation

## 2012-04-10 DIAGNOSIS — I059 Rheumatic mitral valve disease, unspecified: Secondary | ICD-10-CM | POA: Diagnosis not present

## 2012-04-10 DIAGNOSIS — I10 Essential (primary) hypertension: Secondary | ICD-10-CM | POA: Diagnosis not present

## 2012-04-10 DIAGNOSIS — I359 Nonrheumatic aortic valve disorder, unspecified: Secondary | ICD-10-CM

## 2012-04-10 DIAGNOSIS — I350 Nonrheumatic aortic (valve) stenosis: Secondary | ICD-10-CM

## 2012-04-10 DIAGNOSIS — I517 Cardiomegaly: Secondary | ICD-10-CM | POA: Diagnosis not present

## 2012-04-10 DIAGNOSIS — R609 Edema, unspecified: Secondary | ICD-10-CM

## 2012-04-10 LAB — BASIC METABOLIC PANEL
BUN: 32 mg/dL — ABNORMAL HIGH (ref 6–23)
CO2: 28 mEq/L (ref 19–32)
Calcium: 9.1 mg/dL (ref 8.4–10.5)
Chloride: 100 mEq/L (ref 96–112)
Creatinine, Ser: 1.2 mg/dL (ref 0.4–1.2)
GFR: 43.93 mL/min — ABNORMAL LOW (ref >60.00)
Glucose, Bld: 93 mg/dL (ref 70–99)
Potassium: 4.2 mEq/L (ref 3.5–5.1)
Sodium: 137 mEq/L (ref 135–145)

## 2012-04-10 NOTE — Progress Notes (Signed)
[name] Date of Birth: [birth year] Medical Record #[id number]  History of Present Illness: [name] is seen back today for a work in visit. She is seen for Dr. [name]. She has chronic swelling of her legs, HTN, HLD and aortic stenosis. Last echo was in 2010.   She comes in today. She is here alone. She has been calling multiple times to the office with complaints of edema. She is to see [facility] later this month. Her left leg has been so swollen that she has had to cut her stockings off. Her weight is up. She is not short of breath. No chest pain. May be a little dizzy at times. She can now only get one pair of shoes on and she is quite frustrated. Some calf pain on the left is reported. She is worried about DVT. Had a negative doppler back in March. She is off of her Coreg for the past week or so. There was some mix up at the pharmacy. Blood pressure is up today. She is trying to watch her salt but I suspect she has more than she should.   Current Outpatient Prescriptions on File Prior to Visit  Medication Sig Dispense Refill  . calcium carbonate (OS-CAL) 600 MG TABS Take 1,800 mg by mouth daily.      . cholecalciferol (VITAMIN D) 1000 UNITS tablet Take 2,000 Units by mouth daily.        . clonazePAM (KLONOPIN) 0.5 MG tablet Take 0.5 mg by mouth 2 (two) times daily as needed. For anxiety.      . Cyanocobalamin (VITAMIN B-12 PO) Take by mouth daily.      Marland Kitchen dicyclomine (BENTYL) 20 MG tablet Take 20 mg by mouth every 6 (six) hours as needed. For stomach irritability.      Marland Kitchen ezetimibe-simvastatin (VYTORIN) 10-40 MG per tablet Take 1 tablet by mouth at bedtime.        . fish oil-omega-3 fatty acids 1000 MG capsule Take 1 g by mouth 3 (three) times daily.      . furosemide (LASIX) 20 MG tablet Take 1 tablet (20 mg total) by mouth daily.  30 tablet  11  . levothyroxine (SYNTHROID, LEVOTHROID) 75 MCG tablet Take 75 mcg by mouth daily.        . medroxyPROGESTERone (PROVERA) 5 MG tablet Take 5  mg by mouth daily.      Marland Kitchen olmesartan (BENICAR) 40 MG tablet Take 1 tablet (40 mg total) by mouth daily.  30 tablet  11  . pyridOXINE (VITAMIN B-6) 100 MG tablet Take 100 mg by mouth daily.      . vitamin C (ASCORBIC ACID) 500 MG tablet Take 500 mg by mouth daily.      . vitamin E 400 UNIT capsule Take 400 Units by mouth daily.      . carvedilol (COREG) 6.25 MG tablet Take 1 tablet (6.25 mg total) by mouth 2 (two) times daily with a meal.  30 tablet  1  . DISCONTD: losartan-hydrochlorothiazide (HYZAAR) 100-12.5 MG per tablet Take 1 tablet by mouth daily.  30 tablet  11    Allergies  Allergen Reactions  . Aspirin Nausea And Vomiting  . Codeine   . Penicillins Rash    Past Medical History  Diagnosis Date  . HTN (hypertension)   . HLD (hyperlipidemia)   . Hypothyroidism   . Aortic stenosis, moderate     last echo in 2010; AVA 1.1  . Edema of both legs  Past Surgical History  Procedure Date  . Appendectomy 1955  . Foot surgery 2003    bilateral    History  Smoking status  . Former Smoker  . Quit date: 10/31/1983  Smokeless tobacco  . Not on file    History  Alcohol Use No    Family History  Problem Relation Age of Onset  . Coronary artery disease    . Peripheral vascular disease    . Heart attack      Review of Systems: The review of systems is as above.  All other systems were reviewed and are negative.  Physical Exam: BP 162/84  Pulse 66  Ht 5\' 1"  (1.549 m)  Wt 156 lb 9.6 oz (71.033 kg)  BMI 29.59 kg/m2 Weight is up 6 pounds from last visit in April. Patient is very pleasant and in no acute distress. Skin is warm and dry. Color is normal.  HEENT is unremarkable. Normocephalic/atraumatic. PERRL. Sclera are nonicteric. Neck is supple. No masses. No JVD. Lungs are clear. Cardiac exam shows a regular rate and rhythm. She has a soft outflow murmur. Abdomen is soft. Extremities are with 2+ edema on the left and 1+ edema on the right. Gait and ROM are intact. No  gross neurologic deficits noted.  LABORATORY DATA: BMET for today is pending  Lab Results  Component Value Date   WBC 11.8* 01/22/2012   HGB 12.9 01/22/2012   HCT 37.1 01/22/2012   PLT 267 01/22/2012   GLUCOSE 121* 02/22/2012   CHOL 237* 03/22/2011   TRIG 109.0 03/22/2011   HDL 74.80 03/22/2011   LDLDIRECT 164.2 03/22/2011   ALT 14 03/22/2011   AST 27 03/22/2011   NA 139 02/22/2012   K 3.8 02/22/2012   CL 104 02/22/2012   CREATININE 1.3* 02/22/2012   BUN 21 02/22/2012   CO2 24 02/22/2012   INR 0.92 08/19/2009     Assessment / Plan:

## 2012-04-10 NOTE — Assessment & Plan Note (Signed)
Blood pressure is up. I have restarted her Coreg. She is to monitor at home.

## 2012-04-10 NOTE — Patient Instructions (Addendum)
Restart your Coreg  For the next 3 days, increase your Lasix to 40 mg in the am and 20 mg in the early afternoon  Minimize your salt  We are going to update your echo  We are going to check a doppler of your left leg  We are going to check labs today.  See Dr. [name] next week as scheduled  I would like to see you to reevaluate your blood pressure in 2 weeks  Call the [facility] office at [phone] if you have any questions, problems or concerns.

## 2012-04-10 NOTE — Assessment & Plan Note (Signed)
Her edema is worse. She sees [facility] next week. I have increased her Lasix to 40 mg in the am and 20 mg in the pm for the next 3 days, then back to her regular dose. We will check BMET today. Arrange for venous duplex on the left leg to rule out DVT. Will also go ahead and update her echo to make sure her cardiac status is stable. She is to restart her Coreg. I will see her in 2 weeks. She is strongly encouraged to minimize her salt use.  Patient is agreeable to this plan and will call if any problems develop in the interim.

## 2012-04-10 NOTE — Progress Notes (Signed)
Echocardiogram performed.  

## 2012-04-10 NOTE — Assessment & Plan Note (Signed)
Last echo was in 2010. Has known moderate AS. Will go ahead and update. No cardinal symptoms reported.

## 2012-04-15 DIAGNOSIS — M19049 Primary osteoarthritis, unspecified hand: Secondary | ICD-10-CM | POA: Diagnosis not present

## 2012-04-15 DIAGNOSIS — M719 Bursopathy, unspecified: Secondary | ICD-10-CM | POA: Diagnosis not present

## 2012-04-15 DIAGNOSIS — M1710 Unilateral primary osteoarthritis, unspecified knee: Secondary | ICD-10-CM | POA: Diagnosis not present

## 2012-04-15 DIAGNOSIS — M67919 Unspecified disorder of synovium and tendon, unspecified shoulder: Secondary | ICD-10-CM | POA: Diagnosis not present

## 2012-04-15 DIAGNOSIS — M19079 Primary osteoarthritis, unspecified ankle and foot: Secondary | ICD-10-CM | POA: Diagnosis not present

## 2012-04-18 ENCOUNTER — Encounter: Payer: Self-pay | Admitting: Vascular Surgery

## 2012-04-19 ENCOUNTER — Ambulatory Visit (INDEPENDENT_AMBULATORY_CARE_PROVIDER_SITE_OTHER): Payer: Medicare Other | Admitting: *Deleted

## 2012-04-19 ENCOUNTER — Ambulatory Visit (INDEPENDENT_AMBULATORY_CARE_PROVIDER_SITE_OTHER): Payer: Medicare Other | Admitting: Vascular Surgery

## 2012-04-19 ENCOUNTER — Encounter: Payer: Self-pay | Admitting: Vascular Surgery

## 2012-04-19 VITALS — BP 170/80 | HR 65 | Resp 16 | Ht 61.0 in | Wt 155.6 lb

## 2012-04-19 DIAGNOSIS — I350 Nonrheumatic aortic (valve) stenosis: Secondary | ICD-10-CM

## 2012-04-19 DIAGNOSIS — I10 Essential (primary) hypertension: Secondary | ICD-10-CM

## 2012-04-19 DIAGNOSIS — I872 Venous insufficiency (chronic) (peripheral): Secondary | ICD-10-CM | POA: Diagnosis not present

## 2012-04-19 DIAGNOSIS — R600 Localized edema: Secondary | ICD-10-CM

## 2012-04-19 DIAGNOSIS — R609 Edema, unspecified: Secondary | ICD-10-CM | POA: Diagnosis not present

## 2012-04-19 HISTORY — DX: Essential (primary) hypertension: I10

## 2012-04-19 NOTE — Progress Notes (Signed)
[facility]  Referred by:  Vesta Mixer, MD [address] [place], [place] [address]  Reason for referral: Swollen bilateral legs  History of Present Illness  [name] is a 78 y.o. (03-Sep-1927) female who presents with chief complaint: swollen leg.  Patient notes, onset of swelling years ago but especially over the last year.  The patient notes she has been focusing on the care of her sick husband who passed this January, so she had not be paying attention to her own problems.  The swelling is associated with standing for extended periods.  The patient has had no history of DVT, known history of varicose vein, no history of venous stasis ulcers, no history of  Lymphedema and known history of skin changes in lower legs.  There is no family history of venous disorders.  The patient has has used knee high compression stockings in the past.  She notes a full sensation in both calves (L>R) with a bursting pain at the end of day.  Some pruritus is also noted in both legs.  Past Medical History  Diagnosis Date  . HTN (hypertension)   . HLD (hyperlipidemia)   . Hypothyroidism   . Aortic stenosis, moderate     last echo in 2010; AVA 1.1  . Edema of both legs     Past Surgical History  Procedure Date  . Appendectomy 1955  . Foot surgery 2003    bilateral Hammer Toe  . Eye surgery 2005    Bilateral cataract    History   Social History  . Marital Status: Widowed    Spouse Name: N/A    Number of Children: 2  . Years of Education: N/A   Occupational History  . retired    Social History Main Topics  . Smoking status: Former Smoker    Quit date: 10/31/1983  . Smokeless tobacco: Not on file  . Alcohol Use: No  . Drug Use: No  . Sexually Active: No   Other Topics Concern  . Not on file   Social History Narrative  . No narrative on file    Family History  Problem Relation Age of Onset  . Coronary artery disease    .  Peripheral vascular disease    . Heart attack    . Heart disease Mother   . Peripheral vascular disease Mother   . Heart disease Father   . Peripheral vascular disease Father     Right leg amputation  . Hypertension Sister   . Heart disease Brother 70    Heart Disease before age 29    Current Outpatient Prescriptions on File Prior to Visit  Medication Sig Dispense Refill  . ALPRAZolam (XANAX) 0.5 MG tablet Take 0.5 mg by mouth at bedtime as needed.      . calcium carbonate (OS-CAL) 600 MG TABS Take 1,800 mg by mouth daily.      . carvedilol (COREG) 6.25 MG tablet Take 1 tablet (6.25 mg total) by mouth 2 (two) times daily with a meal.  30 tablet  1  . cholecalciferol (VITAMIN D) 1000 UNITS tablet Take 2,000 Units by mouth daily.        . clonazePAM (KLONOPIN) 0.5 MG tablet Take 0.5 mg by mouth 2 (two) times daily as needed. For anxiety.      . Cyanocobalamin (VITAMIN B-12 PO) Take by mouth daily.      . diclofenac sodium (VOLTAREN) 1 % GEL Apply topically.      Marland Kitchen  dicyclomine (BENTYL) 20 MG tablet Take 20 mg by mouth every 6 (six) hours as needed. For stomach irritability.      Marland Kitchen diltiazem (CARDIZEM CD) 180 MG 24 hr capsule Take 180 mg by mouth daily.      . diphenoxylate-atropine (LOMOTIL) 2.5-0.025 MG per tablet Take 1 tablet by mouth 4 (four) times daily as needed.      . ezetimibe-simvastatin (VYTORIN) 10-40 MG per tablet Take 1 tablet by mouth at bedtime.        . fish oil-omega-3 fatty acids 1000 MG capsule Take 1 g by mouth 3 (three) times daily.      . furosemide (LASIX) 20 MG tablet Take 1 tablet (20 mg total) by mouth daily.  30 tablet  11  . levothyroxine (SYNTHROID, LEVOTHROID) 75 MCG tablet Take 75 mcg by mouth daily.        . medroxyPROGESTERone (PROVERA) 5 MG tablet Take 5 mg by mouth daily.      Marland Kitchen olmesartan (BENICAR) 40 MG tablet Take 1 tablet (40 mg total) by mouth daily.  30 tablet  11  . pyridOXINE (VITAMIN B-6) 100 MG tablet Take 100 mg by mouth daily.      . vitamin  C (ASCORBIC ACID) 500 MG tablet Take 500 mg by mouth daily.      . vitamin E 400 UNIT capsule Take 400 Units by mouth daily.      Marland Kitchen DISCONTD: losartan-hydrochlorothiazide (HYZAAR) 100-12.5 MG per tablet Take 1 tablet by mouth daily.  30 tablet  11    Allergies  Allergen Reactions  . Aspirin Nausea And Vomiting  . Codeine   . Penicillins Rash    Review of Systems (Positive items checked otherwise negative)  General: [ ]  Weight loss, [ ]  Weight gain, [ ]   Loss of appetite, [ ]  Fever  Neurologic: [ ]  Dizziness, [ ]  Blackouts, [ ]  Headaches, [ ]  Seizure  Ear/Nose/Throat: [ ]  Change in eyesight, [ ]  Change in hearing, [ ]  Nose bleeds, [ ]  Sore throat  Vascular: [ ]  Pain in legs with walking, [ ]  Pain in feet while lying flat, [ ]  Non-healing ulcer, Stroke, [ ]  "Mini stroke", [ ]  Slurred speech, [ ]  Temporary blindness, [ ]  Blood clot in vein, [x]  L leg swelling  Pulmonary: [ ]  Home oxygen, [ ]  Productive cough, [ ]  Bronchitis, [ ]  Coughing up blood, [ ]  Asthma, [ ]  Wheezing  Musculoskeletal: [ ]  Arthritis, [ ]  Joint pain, [ ]  Muscle pain  Cardiac: [ ]  Chest pain, [ ]  Chest tightness/pressure, [ ]  Shortness of breath when lying flat, [ ]  Shortness of breath with exertion, [ ]  Palpitations, [ ]  Heart murmur, [ ]  Arrythmia, [ ]  Atrial fibrillation  Hematologic: [ ]  Bleeding problems, [ ]  Clotting disorder, [ ]  Anemia  Psychiatric:  [ ]  Depression, [ ]  Anxiety, [ ]  Attention deficit disorder  Gastrointestinal:  [ ]  Black stool,[ ]   Blood in stool, [ ]  Peptic ulcer disease, [ ]  Reflux, [ ]  Hiatal hernia, [ ]  Trouble swallowing, [ ]  Diarrhea, [ ]  Constipation  Urinary:  [ ]  Kidney disease, [ ]  Burning with urination, [ ]  Frequent urination, [ ]  Difficulty urinating  Skin: [ ]  Ulcers, [ ]  Rashes  Physical Examination  Filed Vitals:   04/19/12 1517  BP: 170/80  Pulse: 65  Resp: 16  Height: 5\' 1"  (1.549 m)  Weight: 155 lb 9.6 oz (70.58 kg)  SpO2: 100%   Body mass index is 29.40  kg/(m^2).  General: A&O x 3, WDWN  Head: [place]/AT  Ear/Nose/Throat: Hearing grossly intact, nares w/o erythema or drainage, oropharynx w/o Erythema/Exudate  Eyes: PERRLA, EOMI  Neck: Supple, no nuchal rigidity, no palpable LAD  Pulmonary: Sym exp, good air movt, CTAB, no rales, rhonchi, & wheezing  Cardiac: RRR, Nl S1, S2, no Murmurs, rubs or gallops  Vascular: Vessel Right Left  Radial Palpable Palpable  Brachial Palpable Palpable  Carotid Palpable, without bruit Palpable, without bruit  Aorta Non-palpable N/A  Femoral Palpable Palpable  Popliteal Non-palpable Non-palpable  PT Palpable Palpable  DP Palpable Palpable   Gastrointestinal: soft, NTND, -G/R, - HSM, - masses, - CVAT B  Musculoskeletal: M/S 5/5 throughout , Extremities without ischemic changes , Bilateral swelling: R 1+, L 2+; extensive varicosities B (L>>R), obvious lipodermatosclerosis B  Neurologic: CN 2-12 intact , Pain and light touch intact in extremities , Motor exam as listed above  Psychiatric: Judgment intact, Mood & affect appropriate for pt's clinical situation  Dermatologic: See M/S exam for extremity exam, no rashes otherwise noted  Lymph : No Cervical, Axillary, or Inguinal lymphadenopathy   Non-Invasive Vascular Imaging  BLE Venous Insufficiency Duplex (Date: 04/19/12):   RLE: GSV incompetency, distended anterior branch, CFV/SFV/pop incompetency  LLE: GSV incompetency, CFV/SFV/pop incompetency  Outside Studies/Documentation 10 pages of outside documents were reviewed including: clinic chart notes document workup to date.  Medical Decision Making  Tijana Walder is a 78 y.o. female who presents with: BLE CVI (L>R) (C4).   Based on the patient's history and examination, I recommend: 3 month trial of B thigh high 20-30 mm Hg compression stocking.  I discussed in depth with the patient the nature of atherosclerosis, and emphasized the importance of maximal medical management including  strict control of blood pressure, blood glucose, and lipid levels, antiplatelet agents, obtaining regular exercise, and cessation of smoking.    The patient is aware that without maximal medical management the underlying atherosclerotic disease process will progress, limiting the benefit of any interventions.  After 3 months, the patient will follow up with the [facility] for evaluation for EVLA of GSV L then R as needed.  Thank you for allowing [name] to participate in this patient's care.  Leonides Sake, MD [facility] Office: [phone] Pager: [phone]  04/19/2012, 4:37 PM

## 2012-04-22 DIAGNOSIS — Z1231 Encounter for screening mammogram for malignant neoplasm of breast: Secondary | ICD-10-CM | POA: Diagnosis not present

## 2012-04-25 ENCOUNTER — Encounter: Payer: Self-pay | Admitting: Nurse Practitioner

## 2012-04-25 ENCOUNTER — Ambulatory Visit (INDEPENDENT_AMBULATORY_CARE_PROVIDER_SITE_OTHER): Payer: Medicare Other | Admitting: Nurse Practitioner

## 2012-04-25 VITALS — BP 175/90 | HR 63 | Ht 61.0 in | Wt 153.8 lb

## 2012-04-25 DIAGNOSIS — I359 Nonrheumatic aortic valve disorder, unspecified: Secondary | ICD-10-CM

## 2012-04-25 DIAGNOSIS — I350 Nonrheumatic aortic (valve) stenosis: Secondary | ICD-10-CM

## 2012-04-25 DIAGNOSIS — I10 Essential (primary) hypertension: Secondary | ICD-10-CM

## 2012-04-25 LAB — BASIC METABOLIC PANEL
BUN: 32 mg/dL — ABNORMAL HIGH (ref 6–23)
CO2: 28 mEq/L (ref 19–32)
Calcium: 9.5 mg/dL (ref 8.4–10.5)
Chloride: 104 mEq/L (ref 96–112)
Creatinine, Ser: 1.3 mg/dL — ABNORMAL HIGH (ref 0.4–1.2)
GFR: 40.17 mL/min — ABNORMAL LOW (ref >60.00)
Glucose, Bld: 106 mg/dL — ABNORMAL HIGH (ref 70–99)
Potassium: 4.3 mEq/L (ref 3.5–5.1)
Sodium: 138 mEq/L (ref 135–145)

## 2012-04-25 MED ORDER — CARVEDILOL 12.5 MG PO TABS: 12.5000 mg | ORAL_TABLET | Freq: Two times a day (BID) | ORAL | Status: DC

## 2012-04-25 NOTE — Assessment & Plan Note (Signed)
She has mild AS according to her recent echo.

## 2012-04-25 NOTE — Assessment & Plan Note (Signed)
Blood pressure remains high. I was going to add Hydralazine but she wants to try increasing her current regimen before adding another pill. Coreg is increased to 12.5 mg BID. She has a visit with Dr. [name] in July. She will continue to monitor her BP at home. Patient is agreeable to this plan and will call if any problems develop in the interim.

## 2012-04-25 NOTE — Patient Instructions (Addendum)
We are going to increase your Coreg to 12.5 mg two times a day. Take two of your 6.25mg  tablets to equal this dose two times a day. I have sent a prescription for the higher dose to [facility]  Check your blood pressure at home  Restrict salt  Your legs look better!!  We will recheck your lab today as well.  Keep your appointment with Dr. [name] in July  Call the [facility] office at [phone] if you have any questions, problems or concerns.

## 2012-04-25 NOTE — Progress Notes (Signed)
[name] Date of Birth: [birth year] Medical Record #[id number]  History of Present Illness: [name] is seen back today for a 2 week check. She is seen for Dr. Elease Mcdonald. She has chronic edema of the legs, HTN, HLD and aortic stenosis. At her last visit, she was complaining of more edema. Lasix was increased. Salt use is questionable. We did update her echo. We also restarted her Coreg for her BP. She was to see [facility] since her last visit here as well.   She 78 comes in today. She is here alone. She is doing much better. Blood pressure remains up but her swelling has greatly improved. She is back wearing her support stockings. Had a good visit with Dr. Imogene Mcdonald and sees him again in 3 months. No chest pain. Her echo was reviewed with her.   Current Outpatient Prescriptions on File Prior to Visit  Medication Sig Dispense Refill  . ALPRAZolam (XANAX) 0.5 MG tablet Take 0.5 mg by mouth at bedtime as needed.      . calcium carbonate (OS-CAL) 600 MG TABS Take 1,800 mg by mouth daily.      . carvedilol (COREG) 6.25 MG tablet Take 1 tablet (6.25 mg total) by mouth 2 (two) times daily with a meal.  30 tablet  1  . cholecalciferol (VITAMIN D) 1000 UNITS tablet Take 2,000 Units by mouth daily.        . clonazePAM (KLONOPIN) 0.5 MG tablet Take 0.5 mg by mouth 2 (two) times daily as needed. For anxiety.      . Cyanocobalamin (VITAMIN B-12 PO) Take by mouth daily.      . diclofenac sodium (VOLTAREN) 1 % GEL Apply topically.      . dicyclomine (BENTYL) 20 MG tablet Take 20 mg by mouth every 6 (six) hours as needed. For stomach irritability.      Marland Kitchen diltiazem (CARDIZEM CD) 180 MG 24 hr capsule Take 180 mg by mouth daily.      . diphenoxylate-atropine (LOMOTIL) 2.5-0.025 MG per tablet Take 1 tablet by mouth 4 (four) times daily as needed.      . ezetimibe-simvastatin (VYTORIN) 10-40 MG per tablet Take 1 tablet by mouth at bedtime.        . fish oil-omega-3 fatty acids 1000 MG capsule Take 1 g by mouth 3  (three) times daily.      . furosemide (LASIX) 20 MG tablet Take 1 tablet (20 mg total) by mouth daily.  30 tablet  11  . levothyroxine (SYNTHROID, LEVOTHROID) 75 MCG tablet Take 75 mcg by mouth daily.        Marland Kitchen olmesartan (BENICAR) 40 MG tablet Take 1 tablet (40 mg total) by mouth daily.  30 tablet  11  . pyridOXINE (VITAMIN B-6) 100 MG tablet Take 100 mg by mouth daily.      . vitamin C (ASCORBIC ACID) 500 MG tablet Take 500 mg by mouth daily.      . vitamin E 400 UNIT capsule Take 400 Units by mouth daily.      Marland Kitchen DISCONTD: losartan-hydrochlorothiazide (HYZAAR) 100-12.5 MG per tablet Take 1 tablet by mouth daily.  30 tablet  11    Allergies  Allergen Reactions  . Aspirin Nausea And Vomiting  . Codeine   . Penicillins Rash    Past Medical History  Diagnosis Date  . HTN (hypertension)   . HLD (hyperlipidemia)   . Hypothyroidism   . Aortic stenosis, moderate     last echo in 2010; AVA  1.1  . Edema of both legs     Past Surgical History  Procedure Date  . Appendectomy 1955  . Foot surgery 2003    bilateral Hammer Toe  . Eye surgery 2005    Bilateral cataract    History  Smoking status  . Former Smoker  . Quit date: 10/31/1983  Smokeless tobacco  . Not on file    History  Alcohol Use No    Family History  Problem Relation Age of Onset  . Coronary artery disease    . Peripheral vascular disease    . Heart attack    . Heart disease Mother   . Peripheral vascular disease Mother   . Heart disease Father   . Peripheral vascular disease Father     Right leg amputation  . Hypertension Sister   . Heart disease Brother 48    Heart Disease before age 15    Review of Systems: The review of systems is per the HPI.  All other systems were reviewed and are negative.  Physical Exam: Ht 5\' 1"  (1.549 m)  Wt 153 lb 12.8 oz (69.763 kg)  BMI 29.06 kg/m2 Patient is very pleasant and in no acute distress. Skin is warm and dry. Color is normal.  HEENT is unremarkable.  Normocephalic/atraumatic. PERRL. Sclera are nonicteric. Neck is supple. No masses. No JVD. Lungs are clear. Cardiac exam shows a regular rate and rhythm. Soft outflow murmur. Abdomen is soft. Extremities are with just trace edema now. She has her support stockings on. Gait and ROM are intact. No gross neurologic deficits noted.  LABORATORY DATA:  Echo Study Conclusions from June 2013  - Left ventricle: The cavity size was normal. Wall thickness was increased in a pattern of mild LVH. Systolic function was normal. The estimated ejection fraction was in the range of 55% to 60%. - Aortic valve: There was very mild stenosis. - Mitral valve: Mild regurgitation. - Left atrium: The atrium was mildly dilated. - Atrial septum: No defect or patent foramen ovale was identified. - Pulmonary arteries: PA peak pressure: 41mm Hg (S). - Pericardium, extracardiac: A trivial pericardial effusion was identified.    Chemistry      Component Value Date/Time   NA 137 04/10/2012 0845   K 4.2 04/10/2012 0845   CL 100 04/10/2012 0845   CO2 28 04/10/2012 0845   BUN 32* 04/10/2012 0845   CREATININE 1.2 04/10/2012 0845      Component Value Date/Time   CALCIUM 9.1 04/10/2012 0845   ALKPHOS 65 03/22/2011 1000   AST 27 03/22/2011 1000   ALT 14 03/22/2011 1000   BILITOT 0.9 03/22/2011 1000       Assessment / Plan:

## 2012-04-26 NOTE — Procedures (Unsigned)
LOWER EXTREMITY VENOUS REFLUX EXAM  INDICATION:  Edema.  EXAM:  Using color-flow imaging and pulse Doppler spectral analysis, the bilateral common femoral, femoral, popliteal, posterior tibial, great and small saphenous veins were evaluated.  There is evidence suggesting deep venous Reflux of >53milliseconds noted throughout the bilateral femoral-popliteal venous system.  The bilateral saphenofemoral junctions and non-tortuous great saphenous veins demonstrate Reflux of >566milliseconds with calibers as described below.  The bilateral proximal small saphenous veins demonstrates competency.  GSV Diameter (used if found to be incompetent only)                                                    Right      Left Proximal Greater Saphenous Vein                    0.38 cm    0.68 cm Proximal-to-mid-thigh                              cm         cm Mid thigh                                          0.36 cm    0.58 cm Mid-distal thigh                                   cm         cm Distal thigh                                       0.27 cm    0.58 cm Knee                                               0.22 cm    0.59 cm  IMPRESSION: 1. Bilateral great saphenous vein Reflux is noted as described above. 2. The bilateral deep venous systems demonstrate Reflux as described     above. 3. The bilateral small saphenous veins are competent.  ___________________________________________ Fransisco Hertz, MD  CH/MEDQ  D:  04/24/2012  T:  04/24/2012  Job:  [id number]

## 2012-04-29 ENCOUNTER — Telehealth: Payer: Self-pay | Admitting: Cardiovascular Disease

## 2012-04-29 NOTE — Telephone Encounter (Signed)
Pt bp this morning prior to meds was 166/86, the other readings are after meds through the day, results are good, pt feeling ok. Told her to continue to monitor and call with further questions or concerns. Pt agreed to plan.

## 2012-04-29 NOTE — Telephone Encounter (Signed)
New msg Pt wants to talk to you about her BP 104/62, 119/65,115/65 and 115/62

## 2012-05-08 ENCOUNTER — Ambulatory Visit (INDEPENDENT_AMBULATORY_CARE_PROVIDER_SITE_OTHER): Payer: Medicare Other | Admitting: Cardiovascular Disease

## 2012-05-08 ENCOUNTER — Encounter: Payer: Self-pay | Admitting: Cardiovascular Disease

## 2012-05-08 VITALS — BP 150/85 | HR 53 | Ht 61.0 in | Wt 154.8 lb

## 2012-05-08 DIAGNOSIS — I10 Essential (primary) hypertension: Secondary | ICD-10-CM

## 2012-05-08 NOTE — Patient Instructions (Signed)
Your physician has recommended you make the following change in your medication:   Stopped coreg due to intolerance.   Your physician wants you to follow-up in: 6 months  You will receive a reminder letter in the mail two months in advance. If you don't receive a letter, please call our office to schedule the follow-up appointment.   Your physician recommends that you return for a FASTING lipid profile: 6 months

## 2012-05-08 NOTE — Assessment & Plan Note (Signed)
Mia seems to be doing better. She did not tolerate the Coreg. It may have been due to bradycardia. She said that it caused her to be very fatigued and cause her to feel sick. Her blood pressure remains a little elevated. She admits that she does not sleep well at night. I suspect that she may have some degree of sleep apnea. I've asked her to check with her medical doctor for him to get better sleep. I'll see her back in 6 months for followup office visit.  Her leg edema is markedly improved.

## 2012-05-08 NOTE — Progress Notes (Signed)
[name] Date of Birth  June 03, 1927       [facility]    [place] [address]  [address] [place], [place]  [address]   [place], [place]  [address] [phone]     [phone]   Fax  [phone]    Fax [phone]  Problem List: 1. Hypertension 2. Chronic leg edema Repair of moderate aortic stenosis 4. Hyperlipidemia  History of Present Illness:  [name] is an 78 yo with the above noted history.    We've been gradually uptitrated her medications. She was recently started on a higher dose of Coreg but she was not able to tolerate appear to cause her to be weak and dizzy.  She had an echo recently Echo Study Conclusions from June 2013   - Left ventricle: The cavity size was normal. Wall thickness was increased in a pattern of mild LVH. Systolic function was normal. The estimated ejection fraction was in the range of 55% to 60%. - Aortic valve: There was very mild stenosis. - Mitral valve: Mild regurgitation. - Left atrium: The atrium was mildly dilated. - Atrial septum: No defect or patent foramen ovale was Identified. - Pulmonary arteries: PA peak pressure: 41mm Hg (S). - Pericardium, extracardiac: A trivial pericardial effusion was identified.  She admits that she does not sleep well at night.   Current Outpatient Prescriptions on File Prior to Visit  Medication Sig Dispense Refill  . ALPRAZolam (XANAX) 0.5 MG tablet Take 0.5 mg by mouth at bedtime as needed.      . calcium carbonate (OS-CAL) 600 MG TABS Take 1,800 mg by mouth daily.      . carvedilol (COREG) 12.5 MG tablet Take 1 tablet (12.5 mg total) by mouth 2 (two) times daily.  60 tablet  3  . cholecalciferol (VITAMIN D) 1000 UNITS tablet Take 2,000 Units by mouth daily.        . clonazePAM (KLONOPIN) 0.5 MG tablet Take 0.5 mg by mouth 2 (two) times daily as needed. For anxiety.      . Cyanocobalamin (VITAMIN B-12 PO) Take by mouth daily.      .  diclofenac sodium (VOLTAREN) 1 % GEL Apply topically.      . dicyclomine (BENTYL) 20 MG tablet Take 20 mg by mouth every 6 (six) hours as needed. For stomach irritability.      Marland Kitchen diltiazem (CARDIZEM CD) 180 MG 24 hr capsule Take 180 mg by mouth daily.      . diphenoxylate-atropine (LOMOTIL) 2.5-0.025 MG per tablet Take 1 tablet by mouth 4 (four) times daily as needed.      . ezetimibe-simvastatin (VYTORIN) 10-40 MG per tablet Take 1 tablet by mouth at bedtime.        . fish oil-omega-3 fatty acids 1000 MG capsule Take 1 g by mouth 3 (three) times daily.      . furosemide (LASIX) 20 MG tablet Take 1 tablet (20 mg total) by mouth daily.  30 tablet  11  . levothyroxine (SYNTHROID, LEVOTHROID) 75 MCG tablet Take 75 mcg by mouth daily.        Marland Kitchen olmesartan (BENICAR) 40 MG tablet Take 1 tablet (40 mg total) by mouth daily.  30 tablet  11  . pyridOXINE (VITAMIN B-6) 100 MG tablet Take 100 mg by mouth daily.      . vitamin C (ASCORBIC ACID) 500 MG tablet Take 500 mg by mouth daily.      Marland Kitchen  vitamin E 400 UNIT capsule Take 400 Units by mouth daily.      Marland Kitchen DISCONTD: losartan-hydrochlorothiazide (HYZAAR) 100-12.5 MG per tablet Take 1 tablet by mouth daily.  30 tablet  11  she is not taking her coreg due to side effects ( fatigue, "sick"  Allergies  Allergen Reactions  . Aspirin Nausea And Vomiting  . Codeine   . Penicillins Rash    Past Medical History  Diagnosis Date  . HTN (hypertension)   . HLD (hyperlipidemia)   . Hypothyroidism   . Aortic stenosis, moderate     last echo in 2010; AVA 1.1  . Edema of both legs     Past Surgical History  Procedure Date  . Appendectomy 1955  . Foot surgery 2003    bilateral Hammer Toe  . Eye surgery 2005    Bilateral cataract    History  Smoking status  . Former Smoker  . Quit date: 10/31/1983  Smokeless tobacco  . Not on file    History  Alcohol Use No    Family History  Problem Relation Age of Onset  . Coronary artery disease    .  Peripheral vascular disease    . Heart attack    . Heart disease Mother   . Peripheral vascular disease Mother   . Heart disease Father   . Peripheral vascular disease Father     Right leg amputation  . Hypertension Sister   . Heart disease Brother 48    Heart Disease before age 105    Reviw of Systems:  Reviewed in the HPI.  All other systems are negative.  Physical Exam: Blood pressure 152/85, pulse 53, height 5\' 1"  (1.549 m), weight 154 lb 12.8 oz (70.217 kg). General: Well developed, well nourished, in no acute distress.  Head: Normocephalic, atraumatic, sclera non-icteric, mucus membranes are moist,   Neck: Supple. Carotids are 2 + without bruits. No JVD  Lungs: Clear bilaterally to auscultation.  Heart: regular rate.  normal  S1 S2. She has a soft systolic murmur  Abdomen: Soft, non-tender, non-distended with normal bowel sounds. No hepatomegaly. No rebound/guarding. No masses.  Msk:  Strength and tone are normal  Extremities: No clubbing or cyanosis. No edema.  Distal pedal pulses are 2+ and equal bilaterally.  Neuro: Alert and oriented X 3. Moves all extremities spontaneously.  Psych:  Responds to questions appropriately with a normal affect.  ECG:  Assessment / Plan:

## 2012-05-20 DIAGNOSIS — N72 Inflammatory disease of cervix uteri: Secondary | ICD-10-CM | POA: Diagnosis not present

## 2012-05-20 DIAGNOSIS — Z124 Encounter for screening for malignant neoplasm of cervix: Secondary | ICD-10-CM | POA: Diagnosis not present

## 2012-06-21 DIAGNOSIS — G4700 Insomnia, unspecified: Secondary | ICD-10-CM | POA: Diagnosis not present

## 2012-06-21 DIAGNOSIS — E039 Hypothyroidism, unspecified: Secondary | ICD-10-CM | POA: Diagnosis present

## 2012-06-21 DIAGNOSIS — R600 Localized edema: Secondary | ICD-10-CM | POA: Insufficient documentation

## 2012-06-21 DIAGNOSIS — I359 Nonrheumatic aortic valve disorder, unspecified: Secondary | ICD-10-CM | POA: Diagnosis not present

## 2012-06-21 DIAGNOSIS — I10 Essential (primary) hypertension: Secondary | ICD-10-CM | POA: Diagnosis not present

## 2012-06-21 DIAGNOSIS — Z23 Encounter for immunization: Secondary | ICD-10-CM | POA: Diagnosis not present

## 2012-06-21 DIAGNOSIS — N951 Menopausal and female climacteric states: Secondary | ICD-10-CM | POA: Diagnosis not present

## 2012-06-21 DIAGNOSIS — R609 Edema, unspecified: Secondary | ICD-10-CM | POA: Diagnosis not present

## 2012-06-21 HISTORY — DX: Localized edema: R60.0

## 2012-07-08 ENCOUNTER — Ambulatory Visit: Payer: Medicare Other | Admitting: Nurse Practitioner

## 2012-07-18 ENCOUNTER — Ambulatory Visit: Payer: Medicare Other | Admitting: Nurse Practitioner

## 2012-07-18 DIAGNOSIS — I10 Essential (primary) hypertension: Secondary | ICD-10-CM | POA: Diagnosis not present

## 2012-07-18 DIAGNOSIS — Z79890 Hormone replacement therapy (postmenopausal): Secondary | ICD-10-CM | POA: Diagnosis not present

## 2012-07-18 DIAGNOSIS — B373 Candidiasis of vulva and vagina: Secondary | ICD-10-CM | POA: Diagnosis not present

## 2012-07-18 DIAGNOSIS — N951 Menopausal and female climacteric states: Secondary | ICD-10-CM | POA: Diagnosis not present

## 2012-07-22 ENCOUNTER — Encounter: Payer: Self-pay | Admitting: Vascular Surgery

## 2012-07-23 ENCOUNTER — Ambulatory Visit (INDEPENDENT_AMBULATORY_CARE_PROVIDER_SITE_OTHER): Payer: Medicare Other | Admitting: Vascular Surgery

## 2012-07-23 ENCOUNTER — Encounter: Payer: Self-pay | Admitting: Vascular Surgery

## 2012-07-23 VITALS — BP 131/60 | HR 64 | Resp 18 | Ht 61.0 in | Wt 150.1 lb

## 2012-07-23 DIAGNOSIS — I83893 Varicose veins of bilateral lower extremities with other complications: Secondary | ICD-10-CM | POA: Diagnosis not present

## 2012-07-23 DIAGNOSIS — I83009 Varicose veins of unspecified lower extremity with ulcer of unspecified site: Secondary | ICD-10-CM | POA: Insufficient documentation

## 2012-07-23 NOTE — Progress Notes (Signed)
Problems with Activities of Daily Living Secondary to Leg Pain  1. [name] has difficulty with cooking, cleaning, shopping (any activities that require prolonged sitting or standing) due to leg pain.  2. [name] states she has difficulty exercising due to leg pain and swelling.     Failure of  Conservative Therapy:  1. Worn 20-30 mm Hg thigh high compression hose >3 months with no relief of symptoms.  2. Frequently elevates legs-no relief of symptoms  3. Taken Ibuprofen 600 Mg TID with no relief of symptoms.  Patient is here today for continued discussion of her bilateral severe venous hypertension. She has been compliant with her compression garments and continues to have a marked swelling and discomfort with prolonged standing. This is somewhat worse in her left ankle that her right. I reviewed her prior duplex findings with her. She does have gross reflux in both great saphenous veins. On the right side she also has a large tributary varicosities arises in the proximal saphenous vein extending throughout her thigh. I have recommended staged bilateral laser ablation for improvement of her venous hypertension. I described this as an outpatient procedure under local anesthesia. I did explain the slight risk for DVT associated with this. Since her left side is the most severe she wish to proceed with this first and a staged second right leg ablation following recovery of the first leg

## 2012-07-24 ENCOUNTER — Other Ambulatory Visit: Payer: Self-pay | Admitting: *Deleted

## 2012-07-24 DIAGNOSIS — N952 Postmenopausal atrophic vaginitis: Secondary | ICD-10-CM | POA: Diagnosis not present

## 2012-07-24 DIAGNOSIS — I83893 Varicose veins of bilateral lower extremities with other complications: Secondary | ICD-10-CM

## 2012-07-24 DIAGNOSIS — B373 Candidiasis of vulva and vagina: Secondary | ICD-10-CM | POA: Diagnosis not present

## 2012-08-09 DIAGNOSIS — M1710 Unilateral primary osteoarthritis, unspecified knee: Secondary | ICD-10-CM | POA: Diagnosis not present

## 2012-08-09 DIAGNOSIS — M1610 Unilateral primary osteoarthritis, unspecified hip: Secondary | ICD-10-CM | POA: Diagnosis not present

## 2012-08-21 ENCOUNTER — Encounter: Payer: Medicare Other | Admitting: Internal Medicine

## 2012-08-21 DIAGNOSIS — F4323 Adjustment disorder with mixed anxiety and depressed mood: Secondary | ICD-10-CM | POA: Diagnosis not present

## 2012-08-21 DIAGNOSIS — M1610 Unilateral primary osteoarthritis, unspecified hip: Secondary | ICD-10-CM | POA: Diagnosis not present

## 2012-08-27 DIAGNOSIS — H0019 Chalazion unspecified eye, unspecified eyelid: Secondary | ICD-10-CM | POA: Diagnosis not present

## 2012-08-27 DIAGNOSIS — H5789 Other specified disorders of eye and adnexa: Secondary | ICD-10-CM | POA: Diagnosis not present

## 2012-08-28 ENCOUNTER — Encounter: Payer: Self-pay | Admitting: Vascular Surgery

## 2012-08-29 ENCOUNTER — Ambulatory Visit (INDEPENDENT_AMBULATORY_CARE_PROVIDER_SITE_OTHER): Payer: Medicare Other | Admitting: Vascular Surgery

## 2012-08-29 ENCOUNTER — Telehealth: Payer: Self-pay | Admitting: Cardiovascular Disease

## 2012-08-29 ENCOUNTER — Encounter: Payer: Self-pay | Admitting: Vascular Surgery

## 2012-08-29 VITALS — BP 148/81 | HR 80 | Resp 16 | Ht 61.0 in | Wt 150.0 lb

## 2012-08-29 DIAGNOSIS — I83893 Varicose veins of bilateral lower extremities with other complications: Secondary | ICD-10-CM

## 2012-08-29 HISTORY — PX: ENDOVENOUS ABLATION SAPHENOUS VEIN W/ LASER: SUR449

## 2012-08-29 NOTE — Telephone Encounter (Signed)
Pt states she is having bouts of nausea and occ. dizziness, pt states she was on potassium along time ago but couldn't take because it upset her stomach. Pt unsure but feels maybe her sodium or k+ is low. I offered her an appointment here or pcp or we could get a bmet to see if labs are low, pt decided she would call her pcp.

## 2012-08-29 NOTE — Telephone Encounter (Signed)
lmtcb

## 2012-08-29 NOTE — Telephone Encounter (Signed)
Please return call to patient [phone] regarding possible side affects from Potassium Med.  Pt fatigued and nauseated.

## 2012-08-29 NOTE — Progress Notes (Signed)
Laser Ablation Procedure      Date: 08/29/2012    [name] DOB:1926/11/10  Consent signed: Yes  Surgeon:T.F. Domnick Chervenak  Procedure: Laser Ablation: left Greater Saphenous Vein  BP 148/81  Pulse 80  Resp 16  Ht 5\' 1"  (1.549 m)  Wt 150 lb (68.04 kg)  BMI 28.34 kg/m2  Start time: 8:45AM   End time: 9:40AM  Tumescent Anesthesia: 400 cc 0.9% NaCl with 50 cc Lidocaine HCL with 1% Epi and 15 cc 8.4% NaHCO3  Local Anesthesia: 4 cc Lidocaine HCL and NaHCO3 (ratio 2:1)  Continuous Mode: 15 Watts Total Energy 2524 Joules Total Time2:48       Patient tolerated procedure well: Yes    Description of Procedure:  After marking the course of the saphenous vein and the secondary varicosities in the standing position, the patient was placed on the operating table in the supine position, and the left leg was prepped and draped in sterile fashion. Local anesthetic was administered, and under ultrasound guidance the saphenous vein was accessed with a micro needle and guide wire; then the micro puncture sheath was placed. A guide wire was inserted to the saphenofemoral junction, followed by a 5 french sheath.  The position of the sheath and then the laser fiber below the junction was confirmed using the ultrasound and visualization of the aiming beam.  Tumescent anesthesia was administered along the course of the saphenous vein using ultrasound guidance. Protective laser glasses were placed on the patient, and the laser was fired at at 15 watt continuous mode.  For a total of 2524 joules.  A steri strip was applied to the puncture site.     ABD pads and thigh high compression stockings were applied.  Ace wrap bandages were applied  at the top of the saphenofemoral junction.  Blood loss was less than 15 cc.  The patient ambulated out of the operating room having tolerated the procedure well.  Uneventful ablation from just below the knee to just below the saphenofemoral junction under local  anesthesia. Will be seen again in one week

## 2012-08-30 ENCOUNTER — Encounter: Payer: Self-pay | Admitting: Vascular Surgery

## 2012-09-02 DIAGNOSIS — H00039 Abscess of eyelid unspecified eye, unspecified eyelid: Secondary | ICD-10-CM | POA: Diagnosis not present

## 2012-09-02 DIAGNOSIS — H0019 Chalazion unspecified eye, unspecified eyelid: Secondary | ICD-10-CM | POA: Diagnosis not present

## 2012-09-03 ENCOUNTER — Telehealth: Payer: Self-pay | Admitting: *Deleted

## 2012-09-03 NOTE — Telephone Encounter (Signed)
09/03/2012  Time: 8:37 AM   Patient Name: [name]  Patient of: T.F. Early  Procedure:Laser Ablation left   08-29-2012  Reached patient at home and checked  Her status  Yes    Comments/Actions Taken: Mrs. Clute states mild/moderate pain in her left inner thigh.  She is unable to take Ibuprofen due to gastric issues.  She is using an ice compress and Tylenol as needed for pain. She states she slept well and is up around the house this morning.  Reviewed post procedural instructions with her and reminded her of post laser ablation duplex and follow up appointment with Dr. Arbie Cookey on 09-05-2012.    @SIGNATURE @

## 2012-09-04 ENCOUNTER — Encounter: Payer: Self-pay | Admitting: Vascular Surgery

## 2012-09-05 ENCOUNTER — Encounter: Payer: Self-pay | Admitting: Vascular Surgery

## 2012-09-05 ENCOUNTER — Ambulatory Visit (INDEPENDENT_AMBULATORY_CARE_PROVIDER_SITE_OTHER): Payer: Medicare Other | Admitting: Vascular Surgery

## 2012-09-05 ENCOUNTER — Encounter (INDEPENDENT_AMBULATORY_CARE_PROVIDER_SITE_OTHER): Payer: Medicare Other | Admitting: *Deleted

## 2012-09-05 VITALS — BP 171/77 | HR 66 | Resp 18 | Ht 61.0 in | Wt 140.0 lb

## 2012-09-05 DIAGNOSIS — I83893 Varicose veins of bilateral lower extremities with other complications: Secondary | ICD-10-CM

## 2012-09-05 DIAGNOSIS — Z48812 Encounter for surgical aftercare following surgery on the circulatory system: Secondary | ICD-10-CM | POA: Diagnosis not present

## 2012-09-05 DIAGNOSIS — M7989 Other specified soft tissue disorders: Secondary | ICD-10-CM

## 2012-09-05 NOTE — Progress Notes (Signed)
The patient has today for followup of first leg of staged bilateral ablation. She underwent uneventful left great saphenous vein ablation one week ago from just below her knee to just below her saphenofemoral junction. She had minimal discomfort associated with this. She has been compliant with her compression garments. On physical exam she has minimal bruising and mild tenderness over the medial aspect of her thigh.  Venous duplex today reveals no evidence of DVT. She is closure of her great saphenous vein throughout its course of ablation.  Impression and plan successful laser ablation of left great saphenous vein. She is scheduled for similar treatment of her right leg in 2 weeks.

## 2012-09-18 ENCOUNTER — Encounter: Payer: Self-pay | Admitting: Vascular Surgery

## 2012-09-19 ENCOUNTER — Ambulatory Visit (INDEPENDENT_AMBULATORY_CARE_PROVIDER_SITE_OTHER): Payer: Medicare Other | Admitting: Vascular Surgery

## 2012-09-19 ENCOUNTER — Encounter: Payer: Self-pay | Admitting: Vascular Surgery

## 2012-09-19 VITALS — BP 194/99 | HR 63 | Resp 16 | Ht 61.0 in | Wt 140.0 lb

## 2012-09-19 DIAGNOSIS — I83893 Varicose veins of bilateral lower extremities with other complications: Secondary | ICD-10-CM | POA: Diagnosis not present

## 2012-09-19 HISTORY — PX: ENDOVENOUS ABLATION SAPHENOUS VEIN W/ LASER: SUR449

## 2012-09-19 NOTE — Progress Notes (Signed)
Laser Ablation Procedure      Date: 09/19/2012    [name] DOB:23-Jun-1927  Consent signed: Yes  Surgeon:T.F. Ahmon Tosi  Procedure: Laser Ablation: right Greater Saphenous Vein  BP 194/99  Pulse 63  Resp 16  Ht 5\' 1"  (1.549 m)  Wt 140 lb (63.504 kg)  BMI 26.45 kg/m2  Start time: 10:45AM   End time: 11:35AM  Tumescent Anesthesia: 275 cc 0.9% NaCl with 50 cc Lidocaine HCL with 1% Epi and 15 cc 8.4% NaHCO3  Local Anesthesia: 2 cc Lidocaine HCL and NaHCO3 (ratio 2:1)  Continuous Mode: 15 Watts Total Energy 1164 Joules Total Time1:17       Patient tolerated procedure well: Yes    Description of Procedure:  After marking the course of the saphenous vein and the secondary varicosities in the standing position, the patient was placed on the operating table in the supine position, and the right leg was prepped and draped in sterile fashion. Local anesthetic was administered, and under ultrasound guidance the saphenous vein was accessed with a micro needle and guide wire; then the micro puncture sheath was placed. A guide wire was inserted to the saphenofemoral junction, followed by a 5 french sheath.  The position of the sheath and then the laser fiber below the junction was confirmed using the ultrasound and visualization of the aiming beam.  Tumescent anesthesia was administered along the course of the saphenous vein using ultrasound guidance. Protective laser glasses were placed on the patient, and the laser was fired at at 15 watt continuous mode.  For a total of 1164 joules.  A steri strip was applied to the puncture site.      ABD pads and thigh high compression stockings were applied.  Ace wrap bandages were applied  at the top of the saphenofemoral junction.  Blood loss was less than 15 cc.  The patient ambulated out of the operating room having tolerated the procedure well.

## 2012-09-20 ENCOUNTER — Telehealth: Payer: Self-pay | Admitting: *Deleted

## 2012-09-20 NOTE — Telephone Encounter (Signed)
09/20/2012  Time: 9:43 AM   Patient Name: [name]  Patient of: T.F. Early  Procedure:Laser Ablation right greater saphenous vein 09-19-2012  Reached patient at home and checked  Her status  Yes    Comments/Actions Taken: [name] states she has mild "soreness" in her right inner thigh which is relieved by elevation and using an ice compress.  She is unable to take Ibuprofen due to GI issues.  No problems with swelling.  Reviewed post procedural instructions with [name] and reminded her of post laser ablation duplex and follow up with Dr. Arbie Cookey on 09-24-2012.     @SIGNATURE @

## 2012-09-23 ENCOUNTER — Encounter: Payer: Self-pay | Admitting: Vascular Surgery

## 2012-09-24 ENCOUNTER — Encounter: Payer: Self-pay | Admitting: Vascular Surgery

## 2012-09-24 ENCOUNTER — Encounter (INDEPENDENT_AMBULATORY_CARE_PROVIDER_SITE_OTHER): Payer: Medicare Other | Admitting: *Deleted

## 2012-09-24 ENCOUNTER — Ambulatory Visit (INDEPENDENT_AMBULATORY_CARE_PROVIDER_SITE_OTHER): Payer: Medicare Other | Admitting: Vascular Surgery

## 2012-09-24 VITALS — BP 186/84 | HR 76 | Resp 16 | Ht 61.0 in | Wt 140.0 lb

## 2012-09-24 DIAGNOSIS — I83893 Varicose veins of bilateral lower extremities with other complications: Secondary | ICD-10-CM | POA: Diagnosis not present

## 2012-09-24 DIAGNOSIS — Z48812 Encounter for surgical aftercare following surgery on the circulatory system: Secondary | ICD-10-CM

## 2012-09-24 NOTE — Progress Notes (Signed)
Right great saphenous vein ablation 5 days ago. She had had similar treatment of her weeks prior to this on her left great saphenous vein. She does have usual amount of soreness over the saphenous ablation. She is not able to take ibuprofen.  Physical exam reveals mild bruising over the ablation site and some thickness over the saphenous vein.  Venous duplex today shows successful ablation of her great saphenous vein from the distal insertion site to the just below the saphenofemoral junction. There was no deep venous thrombosis.  Impression and plan a satisfactory staged bilateral great saphenous vein ablation. 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 She will continue to wear her compression garments regarding these. We will see her again in 3 months for continued discussion. She understands that she may require phlebectomy of these large tributaries if she continues to have discomfort.

## 2012-09-30 ENCOUNTER — Ambulatory Visit (INDEPENDENT_AMBULATORY_CARE_PROVIDER_SITE_OTHER): Payer: Medicare Other | Admitting: Nurse Practitioner

## 2012-09-30 ENCOUNTER — Encounter: Payer: Self-pay | Admitting: Nurse Practitioner

## 2012-09-30 VITALS — BP 170/78 | HR 56 | Ht 61.0 in | Wt 146.4 lb

## 2012-09-30 DIAGNOSIS — E785 Hyperlipidemia, unspecified: Secondary | ICD-10-CM

## 2012-09-30 DIAGNOSIS — I10 Essential (primary) hypertension: Secondary | ICD-10-CM | POA: Diagnosis not present

## 2012-09-30 LAB — BASIC METABOLIC PANEL
BUN: 18 mg/dL (ref 6–23)
CO2: 26 mEq/L (ref 19–32)
Calcium: 8.6 mg/dL (ref 8.4–10.5)
Chloride: 102 mEq/L (ref 96–112)
Creatinine, Ser: 1.1 mg/dL (ref 0.4–1.2)
GFR: 50.92 mL/min — ABNORMAL LOW (ref >60.00)
Glucose, Bld: 103 mg/dL — ABNORMAL HIGH (ref 70–99)
Potassium: 4.2 mEq/L (ref 3.5–5.1)
Sodium: 136 mEq/L (ref 135–145)

## 2012-09-30 LAB — LIPID PANEL
Cholesterol: 175 mg/dL (ref 0–200)
HDL: 69.3 mg/dL (ref >39.00)
LDL Cholesterol: 80 mg/dL (ref 0–99)
Total CHOL/HDL Ratio: 3
Triglycerides: 130 mg/dL (ref 0.0–149.0)
VLDL: 26 mg/dL (ref 0.0–40.0)

## 2012-09-30 LAB — HEPATIC FUNCTION PANEL
ALT: 11 U/L (ref 0–35)
AST: 22 U/L (ref 0–37)
Albumin: 3.3 g/dL — ABNORMAL LOW (ref 3.5–5.2)
Alkaline Phosphatase: 55 U/L (ref 39–117)
Bilirubin, Direct: 0.1 mg/dL (ref 0.0–0.3)
Total Bilirubin: 0.7 mg/dL (ref 0.3–1.2)
Total Protein: 6.7 g/dL (ref 6.0–8.3)

## 2012-09-30 NOTE — Patient Instructions (Addendum)
I think you are doing well.  Keep a check of your blood pressure at home  Stay on your current medicines  We will check labs today  We will see you back in 6 months.  Call the [facility] office at [phone] if you have any questions, problems or concerns.

## 2012-09-30 NOTE — Progress Notes (Signed)
[name] Date of Birth: [birth year] Medical Record #[id number]  History of Present Illness: [name] is seen back today for a follow up visit. She is seen for Dr. [name]. She has chronic edema of the legs, HTN, HLD and aortic stenosis. She sees Dr. Arbie Cookey for laser treatment of her legs.   She comes in today. She is here alone. She is doing very well. Blood pressure at home is great and under control. She feels good on her medicines. No chest pain. Not short of breath. Has had a nice response with the laser from Dr. Arbie Cookey. Less swelling. She is quite happy. She has a new relationship which has made her quite happy.   Current Outpatient Prescriptions on File Prior to Visit  Medication Sig Dispense Refill  . calcium carbonate (OS-CAL) 600 MG TABS Take 1,800 mg by mouth daily.      . clonazePAM (KLONOPIN) 0.5 MG tablet Take 0.5 mg by mouth 2 (two) times daily as needed. For anxiety.      . Cyanocobalamin (VITAMIN B-12 PO) Take by mouth daily.      . diclofenac sodium (VOLTAREN) 1 % GEL Apply topically.      . dicyclomine (BENTYL) 20 MG tablet Take 20 mg by mouth every 6 (six) hours as needed. For stomach irritability.      Marland Kitchen diltiazem (CARDIZEM CD) 180 MG 24 hr capsule Take 180 mg by mouth daily.      Marland Kitchen estrogens conjugated, synthetic B, (ENJUVIA) 0.625 MG tablet Take 0.625 mg by mouth daily.      Marland Kitchen ezetimibe-simvastatin (VYTORIN) 10-40 MG per tablet Take 1 tablet by mouth at bedtime.        . fish oil-omega-3 fatty acids 1000 MG capsule Take 1 g by mouth 3 (three) times daily.      Marland Kitchen levothyroxine (SYNTHROID, LEVOTHROID) 75 MCG tablet Take 75 mcg by mouth daily.        . medroxyPROGESTERone (PROVERA) 2.5 MG tablet Take 2.5 mg by mouth daily. For 10 days each month      . miconazole (MICOTIN) 2 % cream Apply topically daily.      Marland Kitchen olmesartan (BENICAR) 40 MG tablet Take 1 tablet (40 mg total) by mouth daily.  30 tablet  11  . pyridOXINE (VITAMIN B-6) 100 MG tablet Take 100 mg by mouth  daily.      . vitamin C (ASCORBIC ACID) 500 MG tablet Take 500 mg by mouth daily.      . vitamin E 400 UNIT capsule Take 400 Units by mouth daily.      . [DISCONTINUED] losartan-hydrochlorothiazide (HYZAAR) 100-12.5 MG per tablet Take 1 tablet by mouth daily.  30 tablet  11    Allergies  Allergen Reactions  . Aspirin Nausea And Vomiting  . Codeine   . Coreg (Carvedilol) Other (See Comments)    Fatigue/ ill  . Penicillins Rash    Past Medical History  Diagnosis Date  . HTN (hypertension)   . HLD (hyperlipidemia)   . Hypothyroidism   . Aortic stenosis, moderate     last echo in 2010; AVA 1.1; mild AS per echo in June 2013  . Edema of both legs     s/p laser treatment per Dr. Arbie Cookey    Past Surgical History  Procedure Date  . Appendectomy 1955  . Foot surgery 2003    bilateral Hammer Toe  . Eye surgery 2005    Bilateral cataract  . Endovenous ablation saphenous vein w/ laser  08-29-2012    left greater saphenous vein   Gretta Began MD  . Endovenous ablation saphenous vein w/ laser 09-19-2012    right greater saphenous vein by Gretta Began MD    History  Smoking status  . Former Smoker  . Quit date: 10/31/1983  Smokeless tobacco  . Not on file    History  Alcohol Use No    Family History  Problem Relation Age of Onset  . Coronary artery disease    . Peripheral vascular disease    . Heart attack    . Heart disease Mother   . Peripheral vascular disease Mother   . Heart disease Father   . Peripheral vascular disease Father     Right leg amputation  . Hypertension Sister   . Heart disease Brother 48    Heart Disease before age 18    Review of Systems: The review of systems is per the HPI.  All other systems were reviewed and are negative.  Physical Exam: BP 170/78  Pulse 56  Ht 5\' 1"  (1.549 m)  Wt 146 lb 6.4 oz (66.407 kg)  BMI 27.66 kg/m2 Patient is very pleasant and in no acute distress. Skin is warm and dry. Color is normal.  HEENT is unremarkable.  Normocephalic/atraumatic. PERRL. Sclera are nonicteric. Neck is supple. No masses. No JVD. Lungs are clear. Cardiac exam shows a regular rate and rhythm. Abdomen is soft. Extremities are with trace edema. Gait and ROM are intact. No gross neurologic deficits noted.  LABORATORY DATA: Pending for today.   Lab Results  Component Value Date   WBC 11.8* 01/22/2012   HGB 12.9 01/22/2012   HCT 37.1 01/22/2012   PLT 267 01/22/2012   GLUCOSE 106* 04/25/2012   CHOL 237* 03/22/2011   TRIG 109.0 03/22/2011   HDL 74.80 03/22/2011   LDLDIRECT 164.2 03/22/2011   ALT 14 03/22/2011   AST 27 03/22/2011   NA 138 04/25/2012   K 4.3 04/25/2012   CL 104 04/25/2012   CREATININE 1.3* 04/25/2012   BUN 32* 04/25/2012   CO2 28 04/25/2012   INR 0.92 08/19/2009   Echo Study Conclusions from June 2013  - Left ventricle: The cavity size was normal. Wall thickness was increased in a pattern of mild LVH. Systolic function was normal. The estimated ejection fraction was in the range of 55% to 60%. - Aortic valve: There was very mild stenosis. - Mitral valve: Mild regurgitation. - Left atrium: The atrium was mildly dilated. - Atrial septum: No defect or patent foramen ovale was identified. - Pulmonary arteries: PA peak pressure: 41mm Hg (S). - Pericardium, extracardiac: A trivial pericardial effusion was identified.    Assessment / Plan: 1. HTN - BP is much better at home. No change in her current medicines. We will plan on seeing her back in 6 months.   2. Edema - with laser treatment under the care of Dr. Arbie Cookey - she is quite happy with the results.   3. Aortic stenosis - mild per her echo back in June. No symptoms  Overall she looks good. We will check fasting labs today. See her back in 6 months. Have her continue to monitor her blood pressure at home.  Patient is agreeable to this plan and will call if any problems develop in the interim.

## 2012-10-08 DIAGNOSIS — L820 Inflamed seborrheic keratosis: Secondary | ICD-10-CM | POA: Diagnosis not present

## 2012-10-08 DIAGNOSIS — L538 Other specified erythematous conditions: Secondary | ICD-10-CM | POA: Diagnosis not present

## 2012-10-08 DIAGNOSIS — L821 Other seborrheic keratosis: Secondary | ICD-10-CM | POA: Diagnosis not present

## 2012-10-18 DIAGNOSIS — H353190 Nonexudative age-related macular degeneration, unspecified eye, stage unspecified: Secondary | ICD-10-CM | POA: Diagnosis not present

## 2012-10-18 DIAGNOSIS — H04129 Dry eye syndrome of unspecified lacrimal gland: Secondary | ICD-10-CM | POA: Diagnosis not present

## 2012-10-18 DIAGNOSIS — H43399 Other vitreous opacities, unspecified eye: Secondary | ICD-10-CM | POA: Diagnosis not present

## 2012-10-18 DIAGNOSIS — Z961 Presence of intraocular lens: Secondary | ICD-10-CM | POA: Diagnosis not present

## 2012-10-18 DIAGNOSIS — H35039 Hypertensive retinopathy, unspecified eye: Secondary | ICD-10-CM | POA: Diagnosis not present

## 2012-11-21 DIAGNOSIS — H26499 Other secondary cataract, unspecified eye: Secondary | ICD-10-CM | POA: Diagnosis not present

## 2012-11-21 DIAGNOSIS — H538 Other visual disturbances: Secondary | ICD-10-CM | POA: Diagnosis not present

## 2012-12-03 DIAGNOSIS — K219 Gastro-esophageal reflux disease without esophagitis: Secondary | ICD-10-CM | POA: Diagnosis not present

## 2012-12-03 DIAGNOSIS — Z79890 Hormone replacement therapy (postmenopausal): Secondary | ICD-10-CM | POA: Diagnosis not present

## 2012-12-03 DIAGNOSIS — N183 Chronic kidney disease, stage 3 unspecified: Secondary | ICD-10-CM | POA: Insufficient documentation

## 2012-12-03 DIAGNOSIS — E785 Hyperlipidemia, unspecified: Secondary | ICD-10-CM | POA: Diagnosis not present

## 2012-12-03 DIAGNOSIS — E039 Hypothyroidism, unspecified: Secondary | ICD-10-CM | POA: Diagnosis not present

## 2012-12-03 DIAGNOSIS — I10 Essential (primary) hypertension: Secondary | ICD-10-CM | POA: Diagnosis not present

## 2012-12-05 DIAGNOSIS — H26499 Other secondary cataract, unspecified eye: Secondary | ICD-10-CM | POA: Diagnosis not present

## 2012-12-30 ENCOUNTER — Encounter: Payer: Self-pay | Admitting: Vascular Surgery

## 2012-12-30 DIAGNOSIS — N189 Chronic kidney disease, unspecified: Secondary | ICD-10-CM | POA: Diagnosis not present

## 2012-12-30 DIAGNOSIS — I10 Essential (primary) hypertension: Secondary | ICD-10-CM | POA: Diagnosis not present

## 2012-12-31 ENCOUNTER — Ambulatory Visit (INDEPENDENT_AMBULATORY_CARE_PROVIDER_SITE_OTHER): Payer: Medicare Other | Admitting: Vascular Surgery

## 2012-12-31 ENCOUNTER — Encounter: Payer: Self-pay | Admitting: Vascular Surgery

## 2012-12-31 VITALS — BP 191/78 | HR 76 | Resp 18 | Ht 61.0 in | Wt 144.0 lb

## 2012-12-31 DIAGNOSIS — I83893 Varicose veins of bilateral lower extremities with other complications: Secondary | ICD-10-CM

## 2012-12-31 NOTE — Progress Notes (Signed)
The patient presents today for continued followup of her venous hypertension. She is status post laser ablation of her great saphenous vein bilaterally. She had marked tributary varicosities to her right medial thigh extending down towards her ankle. These are slightly decompressed but she reports she is still having discomfort over these with prolonged standing. She has been wearing her compression garments. I had discussed this at the time of her ablation that she may require staged stab phlebectomy these large varicosities in the frequently ablation Does not completely resolve the venous hypertension. She wish to proceed with right leg stab phlebectomy. She understands this is an outpatient procedure under local tumescent anesthesia. We will proceed with this on 01/02/2013

## 2013-01-01 ENCOUNTER — Encounter: Payer: Self-pay | Admitting: Vascular Surgery

## 2013-01-01 DIAGNOSIS — Z0000 Encounter for general adult medical examination without abnormal findings: Secondary | ICD-10-CM | POA: Diagnosis not present

## 2013-01-02 ENCOUNTER — Ambulatory Visit (INDEPENDENT_AMBULATORY_CARE_PROVIDER_SITE_OTHER): Payer: Medicare Other | Admitting: Vascular Surgery

## 2013-01-02 ENCOUNTER — Encounter: Payer: Self-pay | Admitting: Vascular Surgery

## 2013-01-02 VITALS — BP 185/80 | HR 80 | Resp 18 | Ht 61.0 in | Wt 144.0 lb

## 2013-01-02 DIAGNOSIS — I83893 Varicose veins of bilateral lower extremities with other complications: Secondary | ICD-10-CM

## 2013-01-02 HISTORY — PX: OTHER SURGICAL HISTORY: SHX169

## 2013-01-02 NOTE — Progress Notes (Signed)
Laser Ablation Procedure      Date: 01/02/2013    [name] DOB:Jan 28, 1927  Consent signed: Yes  Surgeon:T.F. Early    BP 185/80  Pulse 80  Resp 18  Ht 5\' 1"  (1.549 m)  Wt 144 lb (65.318 kg)  BMI 27.22 kg/m2  Start time: 10:30AM   End time: 11:15AM  Tumescent Anesthesia: 200 cc 0.9% NaCl with 50 cc Lidocaine HCL with 1% Epi and 15 cc 8.4% NaHCO3  Local Anesthesia: 1 cc Lidocaine HCL and NaHCO3 (ratio 2:1)      Stab Phlebectomy: 10-15 incisions Sites: Thigh and Calf  Right leg  Patient tolerated procedure well: Yes    Description of Procedure:  After marking  the secondary varicosities in the standing position, the patient was placed on the operating table in the supine position, and the right leg was prepped and draped in sterile fashion. Local anesthetic was administered. The patient was then put into Trendelenburg position.  Local anesthetic was utilized overlying the marked varicosities.  Greater than 10-15 stab wounds were made using the tip of an 11 blade; and using the vein hook,  The phlebectomies were performed using a hemostat to avulse these varicosities.  Adequate hemostasis was achieved, and steri strips were applied to the stab wound.      ABD pads and thigh high compression stockings were applied.  Ace wrap bandages were applied over the phlebectomy sites.  Blood loss was less than 15 cc.  The patient ambulated out of the operating room having tolerated the procedure well.

## 2013-01-07 ENCOUNTER — Telehealth: Payer: Self-pay | Admitting: *Deleted

## 2013-01-07 NOTE — Telephone Encounter (Signed)
01/07/2013  Time: 9:32 AM   Patient Name: [name]  Patient of: T.F. Early  Procedure:stab phlebectomy 10-15 incisions right leg 01-02-2013  Reached patient at home and checked  Her status  Yes    Comments/Actions Taken: No problems with bleeding/oozing, pain or swelling.  Reviewed post stab phlebectomy instructions with [name]. Reminded her of follow up appointment with Dr. Arbie Cookey on 01-28-2013.     @SIGNATURE @

## 2013-01-07 NOTE — Telephone Encounter (Deleted)
Laser Ablation Procedure      Date: 01/07/2013    [name] DOB:15-Jun-1927  Consent signed: {yes/no:20286}  Surgeon:{VVS DOCTORS:[id number]}  Procedure: Laser Ablation: {Right/Left/Bilat:20232} {VVS GREATER/SMALL:21022274} Saphenous Vein  @VS @  Start time: ***   End time: ***  Tumescent Anesthesia: *** cc 0.9% NaCl with 50 cc Lidocaine HCL with 1% Epi and 15 cc 8.4% NaHCO3  Local Anesthesia: *** cc Lidocaine HCL and NaHCO3 (ratio 2:1)  {VVS WATTS 1:61096045}   Sclerotherapy: *** %Sotradecol. Patient received a total of *** cc  Stab Phlebectomy: *** Sites: {VVS SITES:21022275}  Patient tolerated procedure well: {yes/no:20286}  Notes: ***  Description of Procedure:  After marking the course of the saphenous vein and the secondary varicosities in the standing position, the patient was placed on the operating table in the {DESC; PRONE / SUPINE / LATERAL:19389} position, and {Anatomy; leg/right/left/both:19201} was prepped and draped in sterile fashion. Local anesthetic was administered, and under ultrasound guidance the saphenous vein was accessed with a micro needle and guide wire; then the micro puncture sheath was placed. A guide wire was inserted to the {VEIN:21022325} junction, followed by a 5 french sheath.  The position of the sheath and then the laser fiber below the junction was confirmed using the ultrasound and visualization of the aiming beam.  Tumescent anesthesia was administered along the course of the saphenous vein using ultrasound guidance. Protective laser glasses were placed on the patient, and the laser was fired at {VVS WATTS:21022326}.  For a total of *** joules.  A steri strip was applied to the puncture site.  The patient was then put into Trendelenburg position.  Local anesthetic was utilized overlying the marked varicosities.  Greater than *** stab wounds were made using the tip of an 11 blade; and using the vein hook,  The phlebectomies were performed  using a hemostat to avulse these varicosities.  Adequate hemostasis was achieved, and steri strips were applied to the stab wound.    Sclerotherapy was performed to *** perforator vessels using ***  cc .3% Sotradecol foam via a 27g butterfly needle.  ABD pads and thigh high compression stockings were applied.  Ace wrap bandages were applied over the phlebectomy sites and at the top of the {VEIN:21022325} junction.  Blood loss was less than 15 cc.  The patient ambulated out of the operating room having tolerated the procedure well.

## 2013-01-13 DIAGNOSIS — H04129 Dry eye syndrome of unspecified lacrimal gland: Secondary | ICD-10-CM | POA: Diagnosis not present

## 2013-01-13 DIAGNOSIS — H1045 Other chronic allergic conjunctivitis: Secondary | ICD-10-CM | POA: Diagnosis not present

## 2013-01-13 DIAGNOSIS — H01009 Unspecified blepharitis unspecified eye, unspecified eyelid: Secondary | ICD-10-CM | POA: Diagnosis not present

## 2013-01-15 DIAGNOSIS — M67919 Unspecified disorder of synovium and tendon, unspecified shoulder: Secondary | ICD-10-CM | POA: Diagnosis not present

## 2013-01-27 ENCOUNTER — Encounter: Payer: Self-pay | Admitting: Vascular Surgery

## 2013-01-28 ENCOUNTER — Ambulatory Visit (INDEPENDENT_AMBULATORY_CARE_PROVIDER_SITE_OTHER): Payer: Medicare Other | Admitting: Vascular Surgery

## 2013-01-28 ENCOUNTER — Encounter: Payer: Self-pay | Admitting: Vascular Surgery

## 2013-01-28 VITALS — BP 167/70 | HR 73 | Resp 16 | Ht 61.0 in | Wt 145.9 lb

## 2013-01-28 DIAGNOSIS — I83893 Varicose veins of bilateral lower extremities with other complications: Secondary | ICD-10-CM

## 2013-01-28 NOTE — Progress Notes (Signed)
Patient has today for followup of her stab phlebectomy of painful tributary varicosities in her right medial thigh. This was on 01/02/2013. She's done quite well with this. She had minimal bruising and has the typical resolution of the stab phlebectomy sites. The small stab incisions are all well healed she does have some thickness with a blood under the skin as anticipated. She will continue her usual activities and her compression garments were when necessary basis and will see [name] on an as-needed basis

## 2013-02-10 ENCOUNTER — Other Ambulatory Visit: Payer: Self-pay | Admitting: Nurse Practitioner

## 2013-04-01 ENCOUNTER — Ambulatory Visit (INDEPENDENT_AMBULATORY_CARE_PROVIDER_SITE_OTHER): Payer: Medicare Other | Admitting: Cardiovascular Disease

## 2013-04-01 ENCOUNTER — Encounter: Payer: Self-pay | Admitting: Cardiovascular Disease

## 2013-04-01 VITALS — BP 135/60 | HR 55 | Ht 61.0 in | Wt 143.4 lb

## 2013-04-01 DIAGNOSIS — I359 Nonrheumatic aortic valve disorder, unspecified: Secondary | ICD-10-CM | POA: Diagnosis not present

## 2013-04-01 DIAGNOSIS — I350 Nonrheumatic aortic (valve) stenosis: Secondary | ICD-10-CM

## 2013-04-01 DIAGNOSIS — I10 Essential (primary) hypertension: Secondary | ICD-10-CM | POA: Diagnosis not present

## 2013-04-01 NOTE — Assessment & Plan Note (Signed)
Mild, stable.  

## 2013-04-01 NOTE — Assessment & Plan Note (Signed)
[name] is doing well.  Her recheck BP is better.  Continue with her current medications. I will see her in 1 year.

## 2013-04-01 NOTE — Progress Notes (Signed)
[name] Date of Birth  06-18-1927       [facility]    [place] [address], [address]  [address], [address] [place], [place]  [address]   [place], [place]  [address] [phone]     [phone]   Fax  [phone]    Fax [phone]  Problem List: 1. Hypertension 2. Chronic leg edema 3. Hx of mild aortic stenosis 4. Hyperlipidemia  History of Present Illness:  [name] is an 79 yo with the above noted history.    We've been gradually uptitrated her medications. She was recently started on a higher dose of Coreg but she was not able to tolerate appear to cause her to be weak and dizzy.  She had an echo recently Echo Study Conclusions from June 2013   - Left ventricle: The cavity size was normal. Wall thickness was increased in a pattern of mild LVH. Systolic function was normal. The estimated ejection fraction was in the range of 55% to 60%. - Aortic valve: There was very mild stenosis. - Mitral valve: Mild regurgitation. - Left atrium: The atrium was mildly dilated. - Atrial septum: No defect or patent foramen ovale was Identified. - Pulmonary arteries: PA peak pressure: 41mm Hg (S). - Pericardium, extracardiac: A trivial pericardial effusion was identified.  She admits that she does not sleep well at night.   April 01, 2013:  She is feeling well.  Her BP is mildly elevated this am.  She avoids salt.    Current Outpatient Prescriptions on File Prior to Visit  Medication Sig Dispense Refill  . BENICAR 40 MG tablet TAKE ONE TABLET BY MOUTH EVERY DAY  30 tablet  0  . calcium carbonate (OS-CAL) 600 MG TABS Take 1,800 mg by mouth daily.      . clonazePAM (KLONOPIN) 0.5 MG tablet Take 0.5 mg by mouth 2 (two) times daily as needed. For anxiety.      . Cyanocobalamin (VITAMIN B-12 PO) Take by mouth daily.      . diclofenac sodium (VOLTAREN) 1 % GEL Apply topically.      . dicyclomine (BENTYL) 20 MG tablet Take 20 mg by mouth every 6  (six) hours as needed. For stomach irritability.      Marland Kitchen diltiazem (CARDIZEM CD) 180 MG 24 hr capsule Take 180 mg by mouth daily.      Marland Kitchen estrogens conjugated, synthetic B, (ENJUVIA) 0.625 MG tablet Take 0.625 mg by mouth daily.      Marland Kitchen ezetimibe-simvastatin (VYTORIN) 10-40 MG per tablet Take 1 tablet by mouth at bedtime.        . fish oil-omega-3 fatty acids 1000 MG capsule Take 1 g by mouth 3 (three) times daily.      Marland Kitchen levothyroxine (SYNTHROID, LEVOTHROID) 75 MCG tablet Take 75 mcg by mouth daily.        . medroxyPROGESTERone (PROVERA) 2.5 MG tablet Take 2.5 mg by mouth daily. For 10 days each month      . miconazole (MICOTIN) 2 % cream Apply topically daily.      Marland Kitchen pyridOXINE (VITAMIN B-6) 100 MG tablet Take 100 mg by mouth daily.      . vitamin C (ASCORBIC ACID) 500 MG tablet Take 500 mg by mouth daily.      . vitamin E 400 UNIT capsule Take 400 Units by mouth daily.      . [DISCONTINUED] losartan-hydrochlorothiazide (HYZAAR) 100-12.5 MG per tablet Take 1 tablet by mouth daily.  30 tablet  11   No current facility-administered medications on file prior to visit.  she is not taking her coreg due to side effects ( fatigue, "sick"  Allergies  Allergen Reactions  . Aspirin Nausea And Vomiting  . Codeine   . Coreg (Carvedilol) Other (See Comments)    Fatigue/ ill  . Penicillins Rash    Past Medical History  Diagnosis Date  . HTN (hypertension)   . HLD (hyperlipidemia)   . Hypothyroidism   . Aortic stenosis, moderate     last echo in 2010; AVA 1.1; mild AS per echo in June 2013  . Edema of both legs     s/p laser treatment per Dr. Arbie Cookey    Past Surgical History  Procedure Laterality Date  . Appendectomy  1955  . Foot surgery  2003    bilateral Hammer Toe  . Eye surgery  2005    Bilateral cataract  . Endovenous ablation saphenous vein w/ laser  08-29-2012    left greater saphenous vein   Gretta Began MD  . Endovenous ablation saphenous vein w/ laser  09-19-2012    right greater  saphenous vein by Gretta Began MD  . Stab phlebectomy Right 01-02-2013    10-15 incisions right thigh and calf by Gretta Began MD    History  Smoking status  . Former Smoker  . Types: Cigarettes  . Quit date: 10/31/1983  Smokeless tobacco  . Never Used    History  Alcohol Use No    Family History  Problem Relation Age of Onset  . Coronary artery disease    . Peripheral vascular disease    . Heart attack    . Heart disease Mother   . Peripheral vascular disease Mother   . Heart disease Father   . Peripheral vascular disease Father     Right leg amputation  . Hypertension Sister   . Heart disease Brother 48    Heart Disease before age 94    Reviw of Systems:  Reviewed in the HPI.  All other systems are negative.  Physical Exam: Blood pressure 154/80, pulse 55, height 5\' 1"  (1.549 m), weight 143 lb 6.4 oz (65.046 kg). General: Well developed, well nourished, in no acute distress.  Head: Normocephalic, atraumatic, sclera non-icteric, mucus membranes are moist,   Neck: Supple. Carotids are 2 + without bruits. No JVD  Lungs: Clear bilaterally to auscultation.  Heart: regular rate.  normal  S1 S2. She has a soft systolic murmur  Abdomen: Soft, non-tender, non-distended with normal bowel sounds. No hepatomegaly. No rebound/guarding. No masses.  Msk:  Strength and tone are normal  Extremities: No clubbing or cyanosis. No edema.  Distal pedal pulses are 2+ and equal bilaterally.  Neuro: Alert and oriented X 3. Moves all extremities spontaneously.  Psych:  Responds to questions appropriately with a normal affect.  ECG: April 01, 2013:  Sinus brady at 55, no ST or T wave changes Assessment / Plan:

## 2013-04-01 NOTE — Patient Instructions (Addendum)
Your physician wants you to follow-up in: 1 year  You will receive a reminder letter in the mail two months in advance. If you don't receive a letter, please call our office to schedule the follow-up appointment.  Your physician recommends that you continue on your current medications as directed. Please refer to the Current Medication list given to you today.  

## 2013-04-07 ENCOUNTER — Other Ambulatory Visit: Payer: Self-pay | Admitting: Nurse Practitioner

## 2013-04-16 DIAGNOSIS — I10 Essential (primary) hypertension: Secondary | ICD-10-CM | POA: Diagnosis not present

## 2013-04-16 DIAGNOSIS — J209 Acute bronchitis, unspecified: Secondary | ICD-10-CM | POA: Diagnosis not present

## 2013-04-23 ENCOUNTER — Other Ambulatory Visit: Payer: Self-pay

## 2013-04-23 MED ORDER — OLMESARTAN MEDOXOMIL 40 MG PO TABS: ORAL_TABLET | ORAL | Status: DC

## 2013-04-28 DIAGNOSIS — N760 Acute vaginitis: Secondary | ICD-10-CM | POA: Diagnosis not present

## 2013-05-01 DIAGNOSIS — N951 Menopausal and female climacteric states: Secondary | ICD-10-CM | POA: Diagnosis not present

## 2013-05-01 DIAGNOSIS — Z1231 Encounter for screening mammogram for malignant neoplasm of breast: Secondary | ICD-10-CM | POA: Diagnosis not present

## 2013-05-01 DIAGNOSIS — Z13820 Encounter for screening for osteoporosis: Secondary | ICD-10-CM | POA: Diagnosis not present

## 2013-05-08 DIAGNOSIS — M25539 Pain in unspecified wrist: Secondary | ICD-10-CM | POA: Diagnosis not present

## 2013-06-02 DIAGNOSIS — M25539 Pain in unspecified wrist: Secondary | ICD-10-CM | POA: Diagnosis not present

## 2013-07-17 DIAGNOSIS — E039 Hypothyroidism, unspecified: Secondary | ICD-10-CM | POA: Diagnosis not present

## 2013-07-17 DIAGNOSIS — Z79890 Hormone replacement therapy (postmenopausal): Secondary | ICD-10-CM | POA: Diagnosis not present

## 2013-07-17 DIAGNOSIS — I10 Essential (primary) hypertension: Secondary | ICD-10-CM | POA: Diagnosis not present

## 2013-07-17 DIAGNOSIS — M1990 Unspecified osteoarthritis, unspecified site: Secondary | ICD-10-CM | POA: Diagnosis not present

## 2013-07-17 DIAGNOSIS — M25549 Pain in joints of unspecified hand: Secondary | ICD-10-CM | POA: Diagnosis not present

## 2013-08-11 DIAGNOSIS — Z23 Encounter for immunization: Secondary | ICD-10-CM | POA: Diagnosis not present

## 2013-08-11 DIAGNOSIS — Z006 Encounter for examination for normal comparison and control in clinical research program: Secondary | ICD-10-CM | POA: Diagnosis not present

## 2013-08-11 DIAGNOSIS — Z79890 Hormone replacement therapy (postmenopausal): Secondary | ICD-10-CM | POA: Diagnosis not present

## 2013-08-11 DIAGNOSIS — I10 Essential (primary) hypertension: Secondary | ICD-10-CM | POA: Diagnosis not present

## 2013-08-11 DIAGNOSIS — E039 Hypothyroidism, unspecified: Secondary | ICD-10-CM | POA: Diagnosis not present

## 2013-08-11 DIAGNOSIS — R6889 Other general symptoms and signs: Secondary | ICD-10-CM | POA: Diagnosis not present

## 2013-08-13 DIAGNOSIS — H0019 Chalazion unspecified eye, unspecified eyelid: Secondary | ICD-10-CM | POA: Diagnosis not present

## 2013-08-13 DIAGNOSIS — Z79899 Other long term (current) drug therapy: Secondary | ICD-10-CM | POA: Diagnosis not present

## 2013-08-13 DIAGNOSIS — M19079 Primary osteoarthritis, unspecified ankle and foot: Secondary | ICD-10-CM | POA: Diagnosis not present

## 2013-08-13 DIAGNOSIS — Z961 Presence of intraocular lens: Secondary | ICD-10-CM | POA: Diagnosis not present

## 2013-08-13 DIAGNOSIS — M1710 Unilateral primary osteoarthritis, unspecified knee: Secondary | ICD-10-CM | POA: Diagnosis not present

## 2013-08-13 DIAGNOSIS — E559 Vitamin D deficiency, unspecified: Secondary | ICD-10-CM | POA: Diagnosis not present

## 2013-08-13 DIAGNOSIS — H52209 Unspecified astigmatism, unspecified eye: Secondary | ICD-10-CM | POA: Diagnosis not present

## 2013-08-13 DIAGNOSIS — M25549 Pain in joints of unspecified hand: Secondary | ICD-10-CM | POA: Diagnosis not present

## 2013-08-13 DIAGNOSIS — M2550 Pain in unspecified joint: Secondary | ICD-10-CM | POA: Diagnosis not present

## 2013-08-13 DIAGNOSIS — H3530 Unspecified macular degeneration: Secondary | ICD-10-CM | POA: Diagnosis not present

## 2013-08-13 DIAGNOSIS — M19049 Primary osteoarthritis, unspecified hand: Secondary | ICD-10-CM | POA: Diagnosis not present

## 2013-08-19 ENCOUNTER — Emergency Department (HOSPITAL_COMMUNITY): Payer: Medicare Other

## 2013-08-19 ENCOUNTER — Encounter (HOSPITAL_COMMUNITY): Payer: Self-pay | Admitting: Emergency Medicine

## 2013-08-19 ENCOUNTER — Emergency Department (HOSPITAL_COMMUNITY)
Admission: EM | Admit: 2013-08-19 | Discharge: 2013-08-19 | Disposition: A | Discharge status: Home / Self Care | Payer: Medicare Other | Attending: Emergency Medicine | Admitting: Emergency Medicine

## 2013-08-19 DIAGNOSIS — E039 Hypothyroidism, unspecified: Secondary | ICD-10-CM | POA: Insufficient documentation

## 2013-08-19 DIAGNOSIS — Z79899 Other long term (current) drug therapy: Secondary | ICD-10-CM | POA: Insufficient documentation

## 2013-08-19 DIAGNOSIS — S0993XA Unspecified injury of face, initial encounter: Secondary | ICD-10-CM | POA: Diagnosis not present

## 2013-08-19 DIAGNOSIS — I10 Essential (primary) hypertension: Secondary | ICD-10-CM | POA: Diagnosis not present

## 2013-08-19 DIAGNOSIS — W1809XA Striking against other object with subsequent fall, initial encounter: Secondary | ICD-10-CM | POA: Insufficient documentation

## 2013-08-19 DIAGNOSIS — IMO0002 Reserved for concepts with insufficient information to code with codable children: Secondary | ICD-10-CM | POA: Diagnosis not present

## 2013-08-19 DIAGNOSIS — S8000XA Contusion of unspecified knee, initial encounter: Secondary | ICD-10-CM | POA: Diagnosis not present

## 2013-08-19 DIAGNOSIS — S60229A Contusion of unspecified hand, initial encounter: Secondary | ICD-10-CM | POA: Diagnosis not present

## 2013-08-19 DIAGNOSIS — W102XXA Fall (on)(from) incline, initial encounter: Secondary | ICD-10-CM

## 2013-08-19 DIAGNOSIS — Z880 Allergy status to penicillin: Secondary | ICD-10-CM | POA: Diagnosis not present

## 2013-08-19 DIAGNOSIS — T1490XA Injury, unspecified, initial encounter: Secondary | ICD-10-CM | POA: Diagnosis not present

## 2013-08-19 DIAGNOSIS — M25569 Pain in unspecified knee: Secondary | ICD-10-CM | POA: Diagnosis not present

## 2013-08-19 DIAGNOSIS — Z87891 Personal history of nicotine dependence: Secondary | ICD-10-CM | POA: Insufficient documentation

## 2013-08-19 DIAGNOSIS — S0081XA Abrasion of other part of head, initial encounter: Secondary | ICD-10-CM

## 2013-08-19 DIAGNOSIS — M79609 Pain in unspecified limb: Secondary | ICD-10-CM | POA: Diagnosis not present

## 2013-08-19 DIAGNOSIS — E785 Hyperlipidemia, unspecified: Secondary | ICD-10-CM | POA: Diagnosis not present

## 2013-08-19 DIAGNOSIS — S6990XA Unspecified injury of unspecified wrist, hand and finger(s), initial encounter: Secondary | ICD-10-CM | POA: Diagnosis not present

## 2013-08-19 DIAGNOSIS — Y9229 Other specified public building as the place of occurrence of the external cause: Secondary | ICD-10-CM | POA: Insufficient documentation

## 2013-08-19 DIAGNOSIS — S60222A Contusion of left hand, initial encounter: Secondary | ICD-10-CM

## 2013-08-19 DIAGNOSIS — S8002XA Contusion of left knee, initial encounter: Secondary | ICD-10-CM

## 2013-08-19 DIAGNOSIS — Y9301 Activity, walking, marching and hiking: Secondary | ICD-10-CM | POA: Insufficient documentation

## 2013-08-19 DIAGNOSIS — S8990XA Unspecified injury of unspecified lower leg, initial encounter: Secondary | ICD-10-CM | POA: Diagnosis not present

## 2013-08-19 NOTE — ED Provider Notes (Signed)
CSN: [id number]     Arrival date & time 08/19/13  1206 History   First MD Initiated Contact with Patient 08/19/13 1220     Chief Complaint  Patient presents with  . Fall  . Facial Laceration    abrasion   (Consider location/radiation/quality/duration/timing/severity/associated sxs/prior Treatment) Patient is a 79 y.o. female presenting with fall. The history is provided by the patient.  Fall This is a new (Was walking into the jewelry store and did not see the incline in the sidewalk and fell directly onto the concrete.) problem. The current episode started 1 to 2 hours ago. The problem occurs constantly. The problem has not changed since onset.Associated symptoms comments: Left hand and knee pain with swelling.  Hit her face on the concrete with some abrasions. No LOC, no headache, no neck pain. Nothing aggravates the symptoms. Nothing relieves the symptoms. She has tried nothing for the symptoms. The treatment provided no relief.    Past Medical History  Diagnosis Date  . HTN (hypertension)   . HLD (hyperlipidemia)   . Hypothyroidism   . Aortic stenosis, moderate     last echo in 2010; AVA 1.1; mild AS per echo in June 2013  . Edema of both legs     s/p laser treatment per Dr. Arbie Cookey   Past Surgical History  Procedure Laterality Date  . Appendectomy  1955  . Foot surgery  2003    bilateral Hammer Toe  . Eye surgery  2005    Bilateral cataract  . Endovenous ablation saphenous vein w/ laser  08-29-2012    left greater saphenous vein   Gretta Began MD  . Endovenous ablation saphenous vein w/ laser  09-19-2012    right greater saphenous vein by Gretta Began MD  . Stab phlebectomy Right 01-02-2013    10-15 incisions right thigh and calf by Gretta Began MD   Family History  Problem Relation Age of Onset  . Coronary artery disease    . Peripheral vascular disease    . Heart attack    . Heart disease Mother   . Peripheral vascular disease Mother   . Heart disease Father   .  Peripheral vascular disease Father     Right leg amputation  . Hypertension Sister   . Heart disease Brother 48    Heart Disease before age 54   History  Substance Use Topics  . Smoking status: Former Smoker    Types: Cigarettes    Quit date: 10/31/1983  . Smokeless tobacco: Never Used  . Alcohol Use: No   OB History   Grav Para Term Preterm Abortions TAB SAB Ect Mult Living                 Review of Systems  All other systems reviewed and are negative.    Allergies  Aspirin; Codeine; Coreg; and Penicillins  Home Medications   Current Outpatient Rx  Name  Route  Sig  Dispense  Refill  . calcium carbonate (OS-CAL) 600 MG TABS   Oral   Take 1,800 mg by mouth daily.         . Cyanocobalamin (VITAMIN B-12 PO)   Oral   Take by mouth daily.         . diclofenac sodium (VOLTAREN) 1 % GEL   Topical   Apply topically.         . dicyclomine (BENTYL) 20 MG tablet   Oral   Take 20 mg by mouth every 6 (six) hours  as needed. For stomach irritability.         Marland Kitchen estrogens conjugated, synthetic B, (ENJUVIA) 0.625 MG tablet   Oral   Take 0.625 mg by mouth daily.         Marland Kitchen ezetimibe-simvastatin (VYTORIN) 10-40 MG per tablet   Oral   Take 1 tablet by mouth at bedtime.           . fish oil-omega-3 fatty acids 1000 MG capsule   Oral   Take 1 g by mouth 3 (three) times daily.         Marland Kitchen levothyroxine (SYNTHROID, LEVOTHROID) 75 MCG tablet   Oral   Take 75 mcg by mouth daily.           . medroxyPROGESTERone (PROVERA) 2.5 MG tablet   Oral   Take 2.5 mg by mouth daily. For 10 days each month         . olmesartan (BENICAR) 40 MG tablet      TAKE ONE TABLET BY MOUTH ONCE DAILY   90 tablet   1   . pyridOXINE (VITAMIN B-6) 100 MG tablet   Oral   Take 100 mg by mouth daily.         . vitamin C (ASCORBIC ACID) 500 MG tablet   Oral   Take 500 mg by mouth daily.         . vitamin E 400 UNIT capsule   Oral   Take 400 Units by mouth daily.            BP 184/72  Pulse 66  Temp(Src) 98 F (36.7 C) (Oral)  Resp 15  SpO2 98% Physical Exam  Nursing note and vitals reviewed. Constitutional: She is oriented to person, place, and time. She appears well-developed and well-nourished. No distress.  HENT:  Head: Normocephalic and atraumatic.    Right Ear: Tympanic membrane and ear canal normal.  Left Ear: Tympanic membrane and ear canal normal.  Nose: No septal deviation or nasal septal hematoma.  Mouth/Throat: Oropharynx is clear and moist.  Eyes: Conjunctivae and EOM are normal. Pupils are equal, round, and reactive to light.  Neck: Normal range of motion. Neck supple. No spinous process tenderness and no muscular tenderness present.  Cardiovascular: Normal rate, regular rhythm and intact distal pulses.   No murmur heard. Pulmonary/Chest: Effort normal and breath sounds normal. No respiratory distress. She has no wheezes. She has no rales.  Abdominal: Soft. She exhibits no distension. There is no tenderness. There is no rebound and no guarding.  Musculoskeletal: Normal range of motion. She exhibits no edema.       Left knee: She exhibits swelling and ecchymosis. She exhibits normal range of motion. Tenderness found. Medial joint line and lateral joint line tenderness noted.       Left hand: She exhibits tenderness, bony tenderness and swelling.       Hands: Neurological: She is alert and oriented to person, place, and time.  Skin: Skin is warm and dry. No rash noted. No erythema.  Psychiatric: She has a normal mood and affect. Her behavior is normal.    ED Course  Procedures (including critical care time) Labs Review Labs Reviewed - No data to display Imaging Review Dg Knee Complete 4 Views Left  08/19/2013   CLINICAL DATA:  Left anterior knee pain.  Fall.  EXAM: LEFT KNEE - COMPLETE 4+ VIEW  COMPARISON:  None.  FINDINGS: No definite knee effusion. Mild cortical irregularity along the proximal -anterior patella favors  spurring at  the quadriceps insertion over fracture. There is mild patellar spurring and loss of patellofemoral articular space.  Atherosclerosis noted.  IMPRESSION: 1. Mild irregularity of the anterior-superior patella favors quadriceps spurring over acute bony injury/avulsion. If pain persists despite conservative therapy, MRI may be warranted for further characterization. 2. No definite knee effusion. 3. Atherosclerosis.   Electronically Signed   By: Herbie Baltimore M.D.   On: 08/19/2013 13:36   Dg Hand Complete Left  08/19/2013   CLINICAL DATA:  Fall. Pain in hand.  EXAM: LEFT HAND - COMPLETE 3+ VIEW  COMPARISON:  None.  FINDINGS: Mild osteoarthritis. Small ossific structure along the distal-lateral scaphoid probably represents a small secondary ossification center and appears well corticated.  No fracture or dislocation identified.  IMPRESSION: 1. Osteoarthritis. No acute fracture identified.   Electronically Signed   By: Herbie Baltimore M.D.   On: 08/19/2013 13:39    EKG Interpretation   None       MDM   1. Fall (on)(from) incline, initial encounter   2. Facial abrasion, initial encounter   3. Knee contusion, left, initial encounter   4. Hand contusion, left, initial encounter     Patient with a mechanical fall today with injury to the left hand and knee. She also hit her face on the concrete abrasions to the nose. She denies any head or neck pain and no LOC. Patient takes no anticoagulation. At this time patient does not need a CT of her head or neck but we'll do plain films of the hand need to evaluate for fracture.  2:09 PM Plain film neg.  Will d/c home.  Gwyneth Sprout, MD 08/19/13 1409

## 2013-08-19 NOTE — ED Notes (Addendum)
Patient reports has been to xray

## 2013-08-19 NOTE — ED Notes (Signed)
Per [facility] Pt reports she tripped over curb walking into store. Denies LOC neck or back pain. [facility] cleared. Small facial abrasion to bridge of nose. Here for evaluation "just in case"

## 2013-08-19 NOTE — ED Notes (Signed)
Bed: WG95 Expected date:  Expected time:  Means of arrival:  Comments: 79 y/o F fall

## 2013-09-08 DIAGNOSIS — I10 Essential (primary) hypertension: Secondary | ICD-10-CM | POA: Diagnosis not present

## 2013-09-08 DIAGNOSIS — M109 Gout, unspecified: Secondary | ICD-10-CM | POA: Diagnosis not present

## 2013-09-08 DIAGNOSIS — S6000XA Contusion of unspecified finger without damage to nail, initial encounter: Secondary | ICD-10-CM | POA: Diagnosis not present

## 2013-09-30 DIAGNOSIS — M069 Rheumatoid arthritis, unspecified: Secondary | ICD-10-CM | POA: Diagnosis not present

## 2013-10-13 DIAGNOSIS — M19079 Primary osteoarthritis, unspecified ankle and foot: Secondary | ICD-10-CM | POA: Diagnosis not present

## 2013-10-13 DIAGNOSIS — M1710 Unilateral primary osteoarthritis, unspecified knee: Secondary | ICD-10-CM | POA: Diagnosis not present

## 2013-10-13 DIAGNOSIS — M19049 Primary osteoarthritis, unspecified hand: Secondary | ICD-10-CM | POA: Diagnosis not present

## 2013-10-13 DIAGNOSIS — M069 Rheumatoid arthritis, unspecified: Secondary | ICD-10-CM | POA: Diagnosis not present

## 2013-10-31 DIAGNOSIS — M069 Rheumatoid arthritis, unspecified: Secondary | ICD-10-CM | POA: Diagnosis not present

## 2013-10-31 DIAGNOSIS — M2550 Pain in unspecified joint: Secondary | ICD-10-CM | POA: Diagnosis not present

## 2013-10-31 DIAGNOSIS — M1990 Unspecified osteoarthritis, unspecified site: Secondary | ICD-10-CM | POA: Diagnosis not present

## 2013-12-04 DIAGNOSIS — M2550 Pain in unspecified joint: Secondary | ICD-10-CM | POA: Diagnosis not present

## 2013-12-04 DIAGNOSIS — Z79899 Other long term (current) drug therapy: Secondary | ICD-10-CM | POA: Diagnosis not present

## 2013-12-04 DIAGNOSIS — M1990 Unspecified osteoarthritis, unspecified site: Secondary | ICD-10-CM | POA: Diagnosis not present

## 2013-12-04 DIAGNOSIS — M069 Rheumatoid arthritis, unspecified: Secondary | ICD-10-CM | POA: Diagnosis not present

## 2013-12-08 DIAGNOSIS — N760 Acute vaginitis: Secondary | ICD-10-CM | POA: Diagnosis not present

## 2013-12-08 DIAGNOSIS — N390 Urinary tract infection, site not specified: Secondary | ICD-10-CM | POA: Diagnosis not present

## 2014-01-19 ENCOUNTER — Other Ambulatory Visit: Payer: Self-pay | Admitting: Family Medicine

## 2014-01-19 ENCOUNTER — Ambulatory Visit
Admission: RE | Admit: 2014-01-19 | Discharge: 2014-01-19 | Disposition: A | Discharge status: Home / Self Care | Payer: Medicare Other | Source: Ambulatory Visit | Attending: Family Medicine | Admitting: Family Medicine

## 2014-01-19 DIAGNOSIS — R062 Wheezing: Secondary | ICD-10-CM

## 2014-01-19 DIAGNOSIS — J209 Acute bronchitis, unspecified: Secondary | ICD-10-CM | POA: Diagnosis not present

## 2014-01-19 DIAGNOSIS — R0602 Shortness of breath: Secondary | ICD-10-CM | POA: Diagnosis not present

## 2014-01-19 DIAGNOSIS — J984 Other disorders of lung: Secondary | ICD-10-CM | POA: Diagnosis not present

## 2014-01-21 DIAGNOSIS — J189 Pneumonia, unspecified organism: Secondary | ICD-10-CM | POA: Diagnosis not present

## 2014-01-21 DIAGNOSIS — J209 Acute bronchitis, unspecified: Secondary | ICD-10-CM | POA: Diagnosis not present

## 2014-01-22 ENCOUNTER — Encounter (HOSPITAL_COMMUNITY): Payer: Self-pay | Admitting: Emergency Medicine

## 2014-01-22 ENCOUNTER — Observation Stay (HOSPITAL_COMMUNITY): Payer: Medicare Other

## 2014-01-22 ENCOUNTER — Observation Stay (HOSPITAL_COMMUNITY)
Admission: EM | Admit: 2014-01-22 | Discharge: 2014-01-23 | Disposition: A | Discharge status: Home / Self Care | Payer: Medicare Other | Attending: Internal Medicine | Admitting: Internal Medicine

## 2014-01-22 ENCOUNTER — Emergency Department (HOSPITAL_COMMUNITY): Payer: Medicare Other

## 2014-01-22 DIAGNOSIS — Z79899 Other long term (current) drug therapy: Secondary | ICD-10-CM | POA: Insufficient documentation

## 2014-01-22 DIAGNOSIS — R059 Cough, unspecified: Secondary | ICD-10-CM | POA: Diagnosis not present

## 2014-01-22 DIAGNOSIS — I359 Nonrheumatic aortic valve disorder, unspecified: Secondary | ICD-10-CM | POA: Diagnosis not present

## 2014-01-22 DIAGNOSIS — Z9089 Acquired absence of other organs: Secondary | ICD-10-CM | POA: Diagnosis not present

## 2014-01-22 DIAGNOSIS — I83893 Varicose veins of bilateral lower extremities with other complications: Secondary | ICD-10-CM

## 2014-01-22 DIAGNOSIS — R0602 Shortness of breath: Secondary | ICD-10-CM | POA: Diagnosis not present

## 2014-01-22 DIAGNOSIS — I872 Venous insufficiency (chronic) (peripheral): Secondary | ICD-10-CM | POA: Diagnosis not present

## 2014-01-22 DIAGNOSIS — I350 Nonrheumatic aortic (valve) stenosis: Secondary | ICD-10-CM

## 2014-01-22 DIAGNOSIS — K449 Diaphragmatic hernia without obstruction or gangrene: Secondary | ICD-10-CM | POA: Diagnosis not present

## 2014-01-22 DIAGNOSIS — R42 Dizziness and giddiness: Secondary | ICD-10-CM | POA: Insufficient documentation

## 2014-01-22 DIAGNOSIS — J984 Other disorders of lung: Secondary | ICD-10-CM | POA: Diagnosis not present

## 2014-01-22 DIAGNOSIS — R609 Edema, unspecified: Secondary | ICD-10-CM

## 2014-01-22 DIAGNOSIS — I471 Supraventricular tachycardia, unspecified: Secondary | ICD-10-CM | POA: Insufficient documentation

## 2014-01-22 DIAGNOSIS — J40 Bronchitis, not specified as acute or chronic: Secondary | ICD-10-CM | POA: Diagnosis not present

## 2014-01-22 DIAGNOSIS — R4182 Altered mental status, unspecified: Secondary | ICD-10-CM | POA: Diagnosis not present

## 2014-01-22 DIAGNOSIS — Q251 Coarctation of aorta: Secondary | ICD-10-CM | POA: Diagnosis not present

## 2014-01-22 DIAGNOSIS — R197 Diarrhea, unspecified: Secondary | ICD-10-CM | POA: Insufficient documentation

## 2014-01-22 DIAGNOSIS — Z888 Allergy status to other drugs, medicaments and biological substances status: Secondary | ICD-10-CM | POA: Insufficient documentation

## 2014-01-22 DIAGNOSIS — I491 Atrial premature depolarization: Secondary | ICD-10-CM | POA: Diagnosis not present

## 2014-01-22 DIAGNOSIS — J069 Acute upper respiratory infection, unspecified: Secondary | ICD-10-CM | POA: Insufficient documentation

## 2014-01-22 DIAGNOSIS — E785 Hyperlipidemia, unspecified: Secondary | ICD-10-CM | POA: Insufficient documentation

## 2014-01-22 DIAGNOSIS — I10 Essential (primary) hypertension: Secondary | ICD-10-CM | POA: Diagnosis not present

## 2014-01-22 DIAGNOSIS — D72829 Elevated white blood cell count, unspecified: Secondary | ICD-10-CM | POA: Diagnosis not present

## 2014-01-22 DIAGNOSIS — R404 Transient alteration of awareness: Secondary | ICD-10-CM | POA: Diagnosis not present

## 2014-01-22 DIAGNOSIS — E039 Hypothyroidism, unspecified: Secondary | ICD-10-CM | POA: Diagnosis not present

## 2014-01-22 DIAGNOSIS — R55 Syncope and collapse: Principal | ICD-10-CM | POA: Insufficient documentation

## 2014-01-22 DIAGNOSIS — R05 Cough: Secondary | ICD-10-CM | POA: Insufficient documentation

## 2014-01-22 DIAGNOSIS — R600 Localized edema: Secondary | ICD-10-CM | POA: Diagnosis present

## 2014-01-22 DIAGNOSIS — Q2529 Other atresia of aorta: Secondary | ICD-10-CM | POA: Diagnosis not present

## 2014-01-22 LAB — TSH
TSH: 2.669 u[IU]/mL (ref 0.350–4.500)
TSH: 2.401 u[IU]/mL (ref 0.350–4.500)

## 2014-01-22 LAB — CBC
HCT: 39.1 % (ref 36.0–46.0)
HCT: 37.9 % (ref 36.0–46.0)
Hemoglobin: 12.9 g/dL (ref 12.0–15.0)
Hemoglobin: 12.7 g/dL (ref 12.0–15.0)
MCH: 30.2 pg (ref 26.0–34.0)
MCH: 30.6 pg (ref 26.0–34.0)
MCHC: 33 g/dL (ref 30.0–36.0)
MCHC: 33.5 g/dL (ref 30.0–36.0)
MCV: 91.6 fL (ref 78.0–100.0)
MCV: 91.3 fL (ref 78.0–100.0)
Platelets: 299 10*3/uL (ref 150–400)
Platelets: 281 10*3/uL (ref 150–400)
RBC: 4.27 MIL/uL (ref 3.87–5.11)
RBC: 4.15 MIL/uL (ref 3.87–5.11)
RDW: 14.8 % (ref 11.5–15.5)
RDW: 14.7 % (ref 11.5–15.5)
WBC: 13 10*3/uL — ABNORMAL HIGH (ref 4.0–10.5)
WBC: 10.3 10*3/uL (ref 4.0–10.5)

## 2014-01-22 LAB — BASIC METABOLIC PANEL
BUN: 33 mg/dL — ABNORMAL HIGH (ref 6–23)
CO2: 28 mEq/L (ref 19–32)
Calcium: 8.9 mg/dL (ref 8.4–10.5)
Chloride: 99 mEq/L (ref 96–112)
Creatinine, Ser: 1.41 mg/dL — ABNORMAL HIGH (ref 0.50–1.10)
GFR calc Af Amer: 38 mL/min — ABNORMAL LOW (ref >90)
GFR calc non Af Amer: 33 mL/min — ABNORMAL LOW (ref >90)
Glucose, Bld: 102 mg/dL — ABNORMAL HIGH (ref 70–99)
Potassium: 4.4 mEq/L (ref 3.7–5.3)
Sodium: 139 mEq/L (ref 137–147)

## 2014-01-22 LAB — TROPONIN I
Troponin I: <0.3 ng/mL (ref <0.30)
Troponin I: <0.3 ng/mL (ref <0.30)

## 2014-01-22 LAB — CBG MONITORING, ED: Glucose-Capillary: 98 mg/dL (ref 70–99)

## 2014-01-22 LAB — URINALYSIS, ROUTINE W REFLEX MICROSCOPIC
Bilirubin Urine: NEGATIVE
Glucose, UA: NEGATIVE mg/dL
Hgb urine dipstick: NEGATIVE
Ketones, ur: NEGATIVE mg/dL
Leukocytes, UA: NEGATIVE
Nitrite: NEGATIVE
Protein, ur: NEGATIVE mg/dL
Specific Gravity, Urine: 1.02 (ref 1.005–1.030)
Urobilinogen, UA: 0.2 mg/dL (ref 0.0–1.0)
pH: 7 (ref 5.0–8.0)

## 2014-01-22 LAB — CREATININE, SERUM
Creatinine, Ser: 1.24 mg/dL — ABNORMAL HIGH (ref 0.50–1.10)
GFR calc Af Amer: 45 mL/min — ABNORMAL LOW (ref >90)
GFR calc non Af Amer: 39 mL/min — ABNORMAL LOW (ref >90)

## 2014-01-22 MED ORDER — HYDROCODONE-ACETAMINOPHEN 5-325 MG PO TABS: 1.0000 | ORAL_TABLET | ORAL | Status: DC | PRN

## 2014-01-22 MED ORDER — HEPARIN SODIUM (PORCINE) 5000 UNIT/ML IJ SOLN
5000.0000 [IU] | Freq: Three times a day (TID) | INTRAMUSCULAR | Status: DC
  Administered 2014-01-22: 5000 [IU] via SUBCUTANEOUS
  Filled 2014-01-22 (×5): qty 1

## 2014-01-22 MED ORDER — ONDANSETRON HCL 4 MG/2ML IJ SOLN: 4.0000 mg | Freq: Four times a day (QID) | INTRAMUSCULAR | Status: DC | PRN

## 2014-01-22 MED ORDER — ACETAMINOPHEN 500 MG PO TABS: 500.0000 mg | ORAL_TABLET | Freq: Four times a day (QID) | ORAL | Status: DC | PRN

## 2014-01-22 MED ORDER — POLYETHYLENE GLYCOL 3350 17 G PO PACK
17.0000 g | PACK | Freq: Every day | ORAL | Status: DC | PRN
  Filled 2014-01-22: qty 1

## 2014-01-22 MED ORDER — CLONIDINE HCL 0.1 MG PO TABS
0.1000 mg | ORAL_TABLET | Freq: Two times a day (BID) | ORAL | Status: DC
  Administered 2014-01-22: 0.1 mg via ORAL
  Filled 2014-01-22 (×3): qty 1

## 2014-01-22 MED ORDER — GUAIFENESIN-DM 100-10 MG/5ML PO SYRP: 5.0000 mL | ORAL_SOLUTION | ORAL | Status: DC | PRN

## 2014-01-22 MED ORDER — CALCIUM CARBONATE 1250 (500 CA) MG PO TABS
3.0000 | ORAL_TABLET | Freq: Every day | ORAL | Status: DC
  Administered 2014-01-23: 1500 mg via ORAL
  Filled 2014-01-22 (×2): qty 3

## 2014-01-22 MED ORDER — SODIUM CHLORIDE 0.9 % IV SOLN
INTRAVENOUS | Status: AC
  Administered 2014-01-22: 75 mL via INTRAVENOUS

## 2014-01-22 MED ORDER — LEVOTHYROXINE SODIUM 75 MCG PO TABS
75.0000 ug | ORAL_TABLET | Freq: Every day | ORAL | Status: DC
  Administered 2014-01-22 – 2014-01-23 (×2): 75 ug via ORAL
  Filled 2014-01-22 (×3): qty 1

## 2014-01-22 MED ORDER — LEVOFLOXACIN IN D5W 500 MG/100ML IV SOLN
500.0000 mg | Freq: Once | INTRAVENOUS | Status: AC
  Administered 2014-01-22: 500 mg via INTRAVENOUS
  Filled 2014-01-22: qty 100

## 2014-01-22 MED ORDER — LEVOFLOXACIN IN D5W 250 MG/50ML IV SOLN
250.0000 mg | INTRAVENOUS | Status: DC
  Filled 2014-01-22: qty 50

## 2014-01-22 MED ORDER — SODIUM CHLORIDE 0.9 % IJ SOLN
3.0000 mL | Freq: Two times a day (BID) | INTRAMUSCULAR | Status: DC
Start: 2014-01-22 — End: 2014-01-23

## 2014-01-22 MED ORDER — CALCIUM CARBONATE 600 MG PO TABS: 1800.0000 mg | ORAL_TABLET | Freq: Every day | ORAL | Status: DC

## 2014-01-22 MED ORDER — MEDROXYPROGESTERONE ACETATE 2.5 MG PO TABS
2.5000 mg | ORAL_TABLET | Freq: Every day | ORAL | Status: DC
  Filled 2014-01-22: qty 1

## 2014-01-22 MED ORDER — ONDANSETRON HCL 4 MG PO TABS: 4.0000 mg | ORAL_TABLET | Freq: Four times a day (QID) | ORAL | Status: DC | PRN

## 2014-01-22 MED ORDER — DICYCLOMINE HCL 20 MG PO TABS
20.0000 mg | ORAL_TABLET | Freq: Three times a day (TID) | ORAL | Status: DC
  Administered 2014-01-23: 20 mg via ORAL
  Filled 2014-01-22 (×4): qty 1

## 2014-01-22 NOTE — ED Notes (Signed)
Echo at bedside. Will transfer post procedure.

## 2014-01-22 NOTE — Progress Notes (Signed)
  Echocardiogram 2D Echocardiogram has been performed.  [name] 01/22/2014, 4:41 PM

## 2014-01-22 NOTE — ED Notes (Signed)
MD at bedside. 

## 2014-01-22 NOTE — Consult Note (Signed)
Admit date: 01/22/2014 Referring Physician  Dr. Candiss Norse  Primary Cardiologist  Dr. Acie Fredrickson Reason for Consultation  syncope  HPI: 80 -year-old woman who had a syncopal episode earlier today. She has not been feeling well for the past week. She has had a cough, laryngitis and wheezing. She was recently started on an antibiotic. She saw her primary care doctor yesterday. She knew he had a hair appointment today. She asked her doctor if she would be okay to go to her appointment. She went this morning and at the end of the haircut, began to feel like she was going to pass out. She has no prior history of syncope. The next thing she remembers, people were asking her she was okay. She was told she slumped forward in the chair. She did not fall or hurt herself. She is unsure the exact duration of her episode. She denies any chest pain or palpitations prior to the episode. She just felt a sensation like things were going black and she was going to pass out. She did have a warning.  A week ago, she went out walking. She walked in the park several days consecutively. She felt well there. She had no exertional symptoms. She remains active. She denies any recent nausea or vomiting. She had diarrhea starting today but this was after the syncopal episode. She thinks it is related to the antibiotic. She has been eating and drinking regularly. She breakfast this morning.     PMH:   Past Medical History  Diagnosis Date  . HTN (hypertension)   . HLD (hyperlipidemia)   . Hypothyroidism   . Aortic stenosis, moderate     last echo in 2010; AVA 1.1; mild AS per echo in June 2013  . Edema of both legs     s/p laser treatment per Dr. Donnetta Hutching     PSH:   Past Surgical History  Procedure Laterality Date  . Appendectomy  1955  . Foot surgery  2003    bilateral Hammer Toe  . Eye surgery  2005    Bilateral cataract  . Endovenous ablation saphenous vein w/ laser  08-29-2012    left greater saphenous vein   Curt Jews  MD  . Endovenous ablation saphenous vein w/ laser  09-19-2012    right greater saphenous vein by Curt Jews MD  . Stab phlebectomy Right 01-02-2013    10-15 incisions right thigh and calf by Curt Jews MD    Allergies:  Aspirin; Codeine; Coreg; and Penicillins Prior to Admit Meds:   Prescriptions prior to admission  Medication Sig Dispense Refill  . acetaminophen (TYLENOL) 500 MG tablet Take 500 mg by mouth every 6 (six) hours as needed (pain).      . Azithromycin (ZITHROMAX Z-PAK PO) Take 250 tablets by mouth daily. Take two first day and one tablet daily for 3 days      . calcium carbonate (OS-CAL) 600 MG TABS Take 1,800 mg by mouth daily.      Marland Kitchen dicyclomine (BENTYL) 20 MG tablet Take 20 mg by mouth every 6 (six) hours as needed. For stomach irritability.      . fish oil-omega-3 fatty acids 1000 MG capsule Take 1 g by mouth 3 (three) times daily.      Marland Kitchen levothyroxine (SYNTHROID, LEVOTHROID) 75 MCG tablet Take 75 mcg by mouth daily.        . medroxyPROGESTERone (PROVERA) 2.5 MG tablet Take 2.5 mg by mouth daily. For 10 days each month      .  olmesartan (BENICAR) 40 MG tablet Take 40 mg by mouth daily.       Fam HX:    Family History  Problem Relation Age of Onset  . Coronary artery disease    . Peripheral vascular disease    . Heart attack    . Heart disease Mother   . Peripheral vascular disease Mother   . Heart disease Father   . Peripheral vascular disease Father     Right leg amputation  . Hypertension Sister   . Heart disease Brother 96    Heart Disease before age 59   Social HX:    History   Social History  . Marital Status: Widowed    Spouse Name: N/A    Number of Children: 2  . Years of Education: N/A   Occupational History  . retired    Social History Main Topics  . Smoking status: Former Smoker    Types: Cigarettes    Quit date: 10/31/1983  . Smokeless tobacco: Never Used  . Alcohol Use: No  . Drug Use: No  . Sexual Activity: No   Other Topics Concern   . Not on file   Social History Narrative  . No narrative on file     ROS:  All 11 ROS were addressed and are negative except what is stated in the HPI  Physical Exam: Blood pressure 160/76, pulse 58, temperature 98 F (36.7 C), temperature source Oral, resp. rate 20, height 5' (1.524 m), weight 150 lb 6.4 oz (68.221 kg), SpO2 98.00%.    General: Well developed, well nourished, in no acute distress Head: Marland Kitchen   Normal cephalic and atramatic  Lungs:   Clear bilaterally to auscultation and percussion. Heart:   HRRR S1 S2  No JVD.   Abdomen: abdomen soft and non-tender  Msk:  . Normal strength and tone for age. Extremities:  No edema.   Neuro: Alert and oriented Psych:  Normal affect, responds appropriately    Labs:   Lab Results  Component Value Date   WBC 10.3 01/22/2014   HGB 12.7 01/22/2014   HCT 37.9 01/22/2014   MCV 91.3 01/22/2014   PLT 281 01/22/2014    Recent Labs Lab 01/22/14 1250 01/22/14 1754  NA 139  --   K 4.4  --   CL 99  --   CO2 28  --   BUN 33*  --   CREATININE 1.41* 1.24*  CALCIUM 8.9  --   GLUCOSE 102*  --    No results found for this basename: PTT   Lab Results  Component Value Date   INR 0.92 08/19/2009   Lab Results  Component Value Date   TROPONINI <0.30 01/22/2014     Lab Results  Component Value Date   CHOL 175 09/30/2012   CHOL 237* 03/22/2011   Lab Results  Component Value Date   HDL 69.30 09/30/2012   HDL 74.80 03/22/2011   Lab Results  Component Value Date   LDLCALC 80 09/30/2012   Lab Results  Component Value Date   TRIG 130.0 09/30/2012   TRIG 109.0 03/22/2011   Lab Results  Component Value Date   CHOLHDL 3 09/30/2012   CHOLHDL 3 03/22/2011   Lab Results  Component Value Date   LDLDIRECT 164.2 03/22/2011      Radiology:  Dg Chest 2 View  01/22/2014   CLINICAL DATA:  Near syncope  EXAM: CHEST  2 VIEW  COMPARISON:  Insert priors  FINDINGS: Normal heart size  and upper mediastinal contours. There is a moderate to large  hiatal hernia which appears stable in size/distention. Streaky perihilar densities likely reflecting scarring and bronchitic changes given stability from prior. Negative for edema, consolidation, effusion, or pneumothorax.  IMPRESSION: 1. Stable exam. 2. Bronchitic changes and perihilar atelectasis or scarring. 3. Hiatal hernia.   Electronically Signed   By: Jorje Guild M.D.   On: 01/22/2014 14:11   Ct Head Wo Contrast  01/22/2014   CLINICAL DATA:  Syncope, loss of consciousness. Altered mental status.  EXAM: CT HEAD WITHOUT CONTRAST  TECHNIQUE: Contiguous axial images were obtained from the base of the skull through the vertex without intravenous contrast.  COMPARISON:  None available for comparison at time of study interpretation.  FINDINGS: The ventricles and sulci are normal for age. No intraparenchymal hemorrhage, mass effect nor midline shift. Patchy supratentorial white matter hypodensities are within normal range for patient's age and though non-specific suggest sequelae of chronic small vessel ischemic disease. No acute large vascular territory infarcts.  No abnormal extra-axial fluid collections. Basal cisterns are patent. Mild to moderate calcific atherosclerosis of the carotid siphons and to lesser extent included vertebral arteries.  No skull fracture. Visualized paranasal sinuses and mastoid aircells are well-aerated. The included ocular globes and orbital contents are non-suspicious.  IMPRESSION: No acute intracranial process.  Involutional changes. Moderate white matter changes suggest chronic small vessel ischemic disease.   Electronically Signed   By: Elon Alas   On: 01/22/2014 16:51    EKG:  Normal  ASSESSMENT: Syncope, hypertension, aortic stenosis  PLAN:  On exam, she does not have a loud murmur. She has normal carotid upstrokes. There are no physical exam signs of severe aortic stenosis. I do not think her syncope is related to aortic stenosis. Regardless, her  echocardiogram in 2013 showed only mild aortic stenosis. It would be unlikely that she would progress to severe aortic stenosis causing syncope at rest in such a short period of time. Also, one would expect exertional symptoms from her aortic stenosis if it was that significant.  Blood pressure was low when immediately checked after the episode. We'll have to monitor. She does have a history of hypertension.  Syncope may have been from a vagal episode. She had a prodrome. Unclear what the trigger was. It may have just been her overall illness and just not feeling well. Depending on her telemetry, could consider outpatient monitor. She should followup with Dr. Acie Fredrickson.  Jettie Booze., MD  01/22/2014  6:48 PM

## 2014-01-22 NOTE — ED Notes (Signed)
Report called to floor

## 2014-01-22 NOTE — ED Provider Notes (Signed)
CSN: [id number]     Arrival date & time 01/22/14  1125 History   First MD Initiated Contact with Patient 01/22/14 1228     Chief Complaint  Patient presents with  . Near Syncope     (Consider location/radiation/quality/duration/timing/severity/associated sxs/prior Treatment) HPI Pt presenting with c/o near syncope.  She was at the beauty salon having her hair washed- after she sat up she felt dizzy and flushed, then came close to fainting.  No chest pain or shortness of breath.  No palpitations.  No leg swelling.  Has recently been treated for bronchitis- started zpack yesterday.  She was also given a steroid shot.  No difficulty breathing, has been feeling more fatigued than usual this week.  There are no other associated systemic symptoms, there are no other alleviating or modifying factors.   Past Medical History  Diagnosis Date  . HTN (hypertension)   . HLD (hyperlipidemia)   . Hypothyroidism   . Aortic stenosis, moderate     last echo in 2010; AVA 1.1; mild AS per echo in June 2013  . Edema of both legs     s/p laser treatment per Dr. Donnetta Hutching   Past Surgical History  Procedure Laterality Date  . Appendectomy  1955  . Foot surgery  2003    bilateral Hammer Toe  . Eye surgery  2005    Bilateral cataract  . Endovenous ablation saphenous vein w/ laser  08-29-2012    left greater saphenous vein   Curt Jews MD  . Endovenous ablation saphenous vein w/ laser  09-19-2012    right greater saphenous vein by Curt Jews MD  . Stab phlebectomy Right 01-02-2013    10-15 incisions right thigh and calf by Curt Jews MD   Family History  Problem Relation Age of Onset  . Coronary artery disease    . Peripheral vascular disease    . Heart attack    . Heart disease Mother   . Peripheral vascular disease Mother   . Heart disease Father   . Peripheral vascular disease Father     Right leg amputation  . Hypertension Sister   . Heart disease Brother 66    Heart Disease before age 32    History  Substance Use Topics  . Smoking status: Former Smoker    Types: Cigarettes    Quit date: 10/31/1983  . Smokeless tobacco: Never Used  . Alcohol Use: No   OB History   Grav Para Term Preterm Abortions TAB SAB Ect Mult Living                 Review of Systems ROS reviewed and all otherwise negative except for mentioned in HPI    Allergies  Aspirin; Codeine; Coreg; and Penicillins  Home Medications   Current Outpatient Rx  Name  Route  Sig  Dispense  Refill  . acetaminophen (TYLENOL) 500 MG tablet   Oral   Take 500 mg by mouth every 6 (six) hours as needed (pain).         . Azithromycin (ZITHROMAX Z-PAK PO)   Oral   Take 250 tablets by mouth daily. Take two first day and one tablet daily for 3 days         . calcium carbonate (OS-CAL) 600 MG TABS   Oral   Take 1,800 mg by mouth daily.         Marland Kitchen dicyclomine (BENTYL) 20 MG tablet   Oral   Take 20 mg by mouth every  6 (six) hours as needed. For stomach irritability.         . fish oil-omega-3 fatty acids 1000 MG capsule   Oral   Take 1 g by mouth 3 (three) times daily.         Marland Kitchen levothyroxine (SYNTHROID, LEVOTHROID) 75 MCG tablet   Oral   Take 75 mcg by mouth daily.           . medroxyPROGESTERone (PROVERA) 2.5 MG tablet   Oral   Take 2.5 mg by mouth daily. For 10 days each month         . olmesartan (BENICAR) 40 MG tablet   Oral   Take 40 mg by mouth daily.          BP 160/76  Pulse 58  Temp(Src) 98 F (36.7 C) (Oral)  Resp 20  SpO2 98% Vitals reviewed Physical Exam Physical Examination: General appearance - alert, well appearing, and in no distress Mental status - alert, oriented to person, place, and time Eyes - no conjunctival injection, no scleral icterus Mouth - mucous membranes moist, pharynx normal without lesions Chest - clear to auscultation, no wheezes, rales or rhonchi, symmetric air entry Heart - normal rate, regular rhythm, normal S1, S2, no murmurs, rubs,  clicks or gallops Abdomen - soft, nontender, nondistended, no masses or organomegaly Extremities - peripheral pulses normal, no pedal edema, no clubbing or cyanosis Skin - normal coloration and turgor, no rashes  ED Course  Procedures (including critical care time) Labs Review Labs Reviewed  CBC - Abnormal; Notable for the following:    WBC 13.0 (*)    All other components within normal limits  BASIC METABOLIC PANEL - Abnormal; Notable for the following:    Glucose, Bld 102 (*)    BUN 33 (*)    Creatinine, Ser 1.41 (*)    GFR calc non Af Amer 33 (*)    GFR calc Af Amer 38 (*)    All other components within normal limits  CULTURE, EXPECTORATED SPUTUM-ASSESSMENT  URINALYSIS, ROUTINE W REFLEX MICROSCOPIC  TROPONIN I  TSH  CBG MONITORING, ED   Imaging Review Dg Chest 2 View  01/22/2014   CLINICAL DATA:  Near syncope  EXAM: CHEST  2 VIEW  COMPARISON:  Insert priors  FINDINGS: Normal heart size and upper mediastinal contours. There is a moderate to large hiatal hernia which appears stable in size/distention. Streaky perihilar densities likely reflecting scarring and bronchitic changes given stability from prior. Negative for edema, consolidation, effusion, or pneumothorax.  IMPRESSION: 1. Stable exam. 2. Bronchitic changes and perihilar atelectasis or scarring. 3. Hiatal hernia.   Electronically Signed   By: Jorje Guild M.D.   On: 01/22/2014 14:11     EKG Interpretation   Date/Time:  Thursday January 22 2014 11:37:06 EDT Ventricular Rate:  60 PR Interval:  120 QRS Duration: 72 QT Interval:  425 QTC Calculation: 425 R Axis:   74 Text Interpretation:  Sinus rhythm Atrial premature complex No significant  change since last tracing Confirmed by [facility]  MD, MARTHA [phone]) on  01/22/2014 2:51:42 PM      MDM   Final diagnoses:  Near syncope  Aortic stenosis, mild    Pt presenting with near syncope after having bronchitis this week.  Currently on zpack and taking steroids-  which may account for her mild leukocytosis.  CXR and UA are clear.  EKG shows a PAC- this is not different from her prior EKG, she does have hx of aortic  stenosis.  Therefore admitted to triad service for monitoring.      Threasa Beards, MD 01/22/14 [phone]

## 2014-01-22 NOTE — ED Notes (Signed)
Patient almost passed out in beauty salon today. CBG-141 per EMS.  BP 90/78 pulse 80.  Alert and oriented now nausea, vomiting.  Patient was diaphoretic. Patient has recently had bronchitis and has been to the doctor twice this week.  Taking antibiotics.  Tolerating antibiotics. History of valve regurgitation, high blood pressure.

## 2014-01-22 NOTE — H&P (Addendum)
Patient Demographics  [name], is a 80 y.o. female  MRN: [id number]   DOB - [birth year]  Admit Date - 01/22/2014  Outpatient Primary MD for the patient is Rachell Cipro, MD   With History of -  Past Medical History  Diagnosis Date  . HTN (hypertension)   . HLD (hyperlipidemia)   . Hypothyroidism   . Aortic stenosis, moderate     last echo in 2010; AVA 1.1; mild AS per echo in June 2013  . Edema of both legs     s/p laser treatment per Dr. Donnetta Hutching      Past Surgical History  Procedure Laterality Date  . Appendectomy  1955  . Foot surgery  2003    bilateral Hammer Toe  . Eye surgery  2005    Bilateral cataract  . Endovenous ablation saphenous vein w/ laser  08-29-2012    left greater saphenous vein   Curt Jews MD  . Endovenous ablation saphenous vein w/ laser  09-19-2012    right greater saphenous vein by Curt Jews MD  . Stab phlebectomy Right 01-02-2013    10-15 incisions right thigh and calf by Curt Jews MD    in for   Chief Complaint  Patient presents with  . Syncope     HPI  [name]  is a 80 y.o. female, with history of mild aortic stenosis per echo gram and 2013, varicose veins, hypothyroidism, hypertension, dyslipidemia who was at the hairdresser getting her hair done, was in a sitting position and subsequently passed out and lost consciousness for a few minutes, she did say she had a prodrome feeling of feeling weak, no headache or chest pain prior to this, no loss of bowel or bladder control, no jerky movements her tongue bites, did not have any palpitations, no aura, never had similar problem before, no focal weakness, she does confirm of her ongoing cough for the last week for which she was recently started on Z-Pak yesterday without much benefit, she has some exertional shortness  of breath for the last 1 week, no sick contacts no fever chills.   She was found to be mildly hypotensive when EMS arrived. In the ER workup unremarkable except mild leukocytosis, I was called to admit the patient for syncope workup.    Review of Systems  Negative NOW  In addition to the HPI above,   No Fever-chills, No Headache, No changes with Vision or hearing, No problems swallowing food or Liquids, No Chest pain, Cough or Shortness of Breath, No Abdominal pain, No Nausea or Vommitting, Bowel movements are regular, No Blood in stool or Urine, No dysuria, No new skin rashes or bruises, No new joints pains-aches,  No new weakness, tingling, numbness in any extremity, No recent weight gain or loss, No polyuria, polydypsia or polyphagia, No significant Mental Stressors.  A full 10 point Review of Systems was done, except as stated above, all other Review of Systems were negative.  Social History History  Substance Use Topics  . Smoking status: Former Smoker    Types: Cigarettes    Quit date: 10/31/1983  . Smokeless tobacco: Never Used  . Alcohol Use: No      Family History Family History  Problem Relation Age of Onset  . Coronary artery disease    . Peripheral vascular disease    . Heart attack    . Heart disease Mother   . Peripheral vascular disease Mother   . Heart disease Father   . Peripheral vascular disease Father     Right leg amputation  . Hypertension Sister   . Heart disease Brother 38    Heart Disease before age 54      Prior to Admission medications   Medication Sig Start Date End Date Taking? Authorizing Provider  acetaminophen (TYLENOL) 500 MG tablet Take 500 mg by mouth every 6 (six) hours as needed (pain).   Yes Historical Provider, MD  Azithromycin (ZITHROMAX Z-PAK PO) Take 250 tablets by mouth daily. Take two first day and one tablet daily for 3 days   Yes Historical Provider, MD  calcium carbonate (OS-CAL) 600 MG TABS Take 1,800 mg by  mouth daily.   Yes Historical Provider, MD  dicyclomine (BENTYL) 20 MG tablet Take 20 mg by mouth every 6 (six) hours as needed. For stomach irritability.   Yes Historical Provider, MD  fish oil-omega-3 fatty acids 1000 MG capsule Take 1 g by mouth 3 (three) times daily.   Yes Historical Provider, MD  levothyroxine (SYNTHROID, LEVOTHROID) 75 MCG tablet Take 75 mcg by mouth daily.     Yes Historical Provider, MD  medroxyPROGESTERone (PROVERA) 2.5 MG tablet Take 2.5 mg by mouth daily. For 10 days each month   Yes Historical Provider, MD  olmesartan (BENICAR) 40 MG tablet Take 40 mg by mouth daily.   Yes Historical Provider, MD    Allergies  Allergen Reactions  . Aspirin Nausea And Vomiting  . Codeine Nausea And Vomiting    "Deathly Sick"  . Coreg [Carvedilol] Other (See Comments)    Fatigue/ ill  . Penicillins Rash    Physical Exam  Vitals  Blood pressure 160/76, pulse 58, temperature 98 F (36.7 C), temperature source Oral, resp. rate 20, SpO2 98.00%.   1. General elderly white female lying in bed in NAD,     2. Normal affect and insight, Not Suicidal or Homicidal, Awake Alert, Oriented X 3.  3. No F.N deficits, ALL C.Nerves Intact, Strength 5/5 all 4 extremities, Sensation intact all 4 extremities, Plantars down going.  4. Ears and Eyes appear Normal, Conjunctivae clear, PERRLA. Moist Oral Mucosa.  5. Supple Neck, No JVD, No cervical lymphadenopathy appriciated, No Carotid Bruits.  6. Symmetrical Chest wall movement, Good air movement bilaterally, coarse B sounds  7. RRR, No Gallops, 1/5 aortic systolic murmur, No Parasternal Heave.  8. Positive Bowel Sounds, Abdomen Soft, Non tender, No organomegaly appriciated,No rebound -guarding or rigidity.  9.  No Cyanosis, Normal Skin Turgor, No Skin Rash or Bruise.  10. Good muscle tone,  joints appear normal , no effusions, Normal ROM.  11. No Palpable Lymph Nodes in Neck or Axillae     Data Review  CBC  Recent Labs Lab  01/22/14 1250  WBC 13.0*  HGB 12.9  HCT 39.1  PLT 299  MCV 91.6  MCH 30.2  MCHC 33.0  RDW 14.8   ------------------------------------------------------------------------------------------------------------------  Chemistries   Recent Labs Lab 01/22/14 1250  NA 139  K 4.4  CL 99  CO2 28  GLUCOSE 102*  BUN 33*  CREATININE 1.41*  CALCIUM 8.9   ------------------------------------------------------------------------------------------------------------------ CrCl is unknown because both a height and weight (above a minimum accepted value) are required for this calculation. ------------------------------------------------------------------------------------------------------------------ No results found for this basename: TSH, T4TOTAL, FREET3, T3FREE, THYROIDAB,  in the last 72 hours   Coagulation profile No results found for this basename: INR, PROTIME,  in the last 168 hours ------------------------------------------------------------------------------------------------------------------- No results found for this basename: DDIMER,  in the last 72 hours -------------------------------------------------------------------------------------------------------------------  Cardiac Enzymes  Recent Labs Lab 01/22/14 1250  TROPONINI <0.30   ------------------------------------------------------------------------------------------------------------------ No components found with this basename: POCBNP,    ---------------------------------------------------------------------------------------------------------------  Urinalysis    Component Value Date/Time   COLORURINE YELLOW 01/22/2014 [place] 01/22/2014 1339   LABSPEC 1.020 01/22/2014 1339   PHURINE 7.0 01/22/2014 1339   GLUCOSEU NEGATIVE 01/22/2014 1339   HGBUR NEGATIVE 01/22/2014 1339   BILIRUBINUR NEGATIVE 01/22/2014 1339   KETONESUR NEGATIVE 01/22/2014 1339   PROTEINUR NEGATIVE 01/22/2014 1339    UROBILINOGEN 0.2 01/22/2014 1339   NITRITE NEGATIVE 01/22/2014 1339   LEUKOCYTESUR NEGATIVE 01/22/2014 1339    ----------------------------------------------------------------------------------------------------------------  Imaging results:   Dg Chest 2 View  01/22/2014   CLINICAL DATA:  Near syncope  EXAM: CHEST  2 VIEW  COMPARISON:  Insert priors  FINDINGS: Normal heart size and upper mediastinal contours. There is a moderate to large hiatal hernia which appears stable in size/distention. Streaky perihilar densities likely reflecting scarring and bronchitic changes given stability from prior. Negative for edema, consolidation, effusion, or pneumothorax.  IMPRESSION: 1. Stable exam. 2. Bronchitic changes and perihilar atelectasis or scarring. 3. Hiatal hernia.   Electronically Signed   By: Jorje Guild M.D.   On: 01/22/2014 14:11      My personal review of EKG: Rhythm NSR, few PACs , no Acute ST changes    Assessment & Plan    1. Syncope. In a patient with history of mild aortic stenosis 2 years ago - give some history of exertional shortness of breath for the last 7-10 days, at this time will be kept in 23 hour observation a telemetry bed, she is not orthostatic in the ER, will repeat echogram, cycle troponins, cardiology consulted to see the patient. We'll also check carotids, stop the ARB as can cause orthostatic hypotension in the setting of venous insufficiency. For now try Catapres. She is allergic to beta blockers. She was slightly hypotensive once EMS arrived, we'll gently hydrate and monitor.   Have ordered head CT as there was complete loss of consciousness. Currently no focal deficits.   2. Hypertension. Stopping ARB due to history of venous insufficiency and syncopal episode, ARB can cause orthostasis, we'll switch to Catapres and monitor. She is allergic to beta blocker.   3. Dyslipidemia. Continue fish oil once in the outpatient setting.   4. Hypothyroidism continue  home dose Synthroid check TSH.   5. URI. Place on Levaquin. Monitor clinically, sputum cultures.   6. Exertional shortness of breath. Question due to #1 or #5 above, cycle troponins, repeat echogram, cardiology to evaluate. No signs of edema on exam.      DVT Prophylaxis Heparin    AM Labs Ordered, also please review Full Orders  Family Communication: Admission, patients condition and plan of care including tests being ordered have been discussed with the patient and family who indicate understanding and agree with the plan and Code Status.  Code Status Full  Likely DC to home  Condition fair  Time spent in minutes : 35    Rosalee Tolley K M.D on 01/22/2014 at 3:55 PM  Between 7am to 7pm - Pager - [phone]  After 7pm go to [email or web address] - password [email or web address]  And look for the night coverage person covering me after hours  [facility] Office  [phone]

## 2014-01-22 NOTE — ED Notes (Signed)
Bed: BS49 Expected date:  Expected time:  Means of arrival:  Comments: ems- near syncopal episode

## 2014-01-22 NOTE — Progress Notes (Signed)
ANTIBIOTIC CONSULT NOTE - INITIAL  Pharmacy Consult for:  IV Levaquin Indication:  Upper respiratory infection  Allergies  Allergen Reactions  . Aspirin Nausea And Vomiting  . Codeine Nausea And Vomiting    "Deathly Sick"  . Coreg [Carvedilol] Other (See Comments)    Fatigue/ ill  . Penicillins Rash    Patient Measurements: 01/22/14 - Height 60 inches  Weight 68.2 kg  Vital Signs: Temp: 98 F (36.7 C) (03/26 1131) Temp src: Oral (03/26 1131) BP: 160/76 mmHg (03/26 1546) Pulse Rate: 58 (03/26 1546)   Labs:  Recent Labs  01/22/14 1250  WBC 13.0*  HGB 12.9  PLT 299  CREATININE 1.41*   The estimated creatinine clearance is 25.6 ml/min using SCr 1.41 and the Cockroft-Gault equation.   Medical History: Past Medical History  Diagnosis Date  . HTN (hypertension)   . HLD (hyperlipidemia)   . Hypothyroidism   . Aortic stenosis, moderate     last echo in 2010; AVA 1.1; mild AS per echo in June 2013  . Edema of both legs     s/p laser treatment per Dr. Donnetta Hutching    Assessment: Asked to assist with IV Levaquin therapy for this 80 year-old female with an upper respiratory infection.  Goals of Therapy:   Eradication of infection  Dose appropriate for renal function  Plan:  Give 500 mg IV as the initial dose, then 250 mg IV every 24 hours as recommended when CrCl = 20-49 ml/min.  BowenPh. 01/22/2014 6:13 PM

## 2014-01-23 DIAGNOSIS — R55 Syncope and collapse: Secondary | ICD-10-CM | POA: Diagnosis not present

## 2014-01-23 DIAGNOSIS — I471 Supraventricular tachycardia: Secondary | ICD-10-CM

## 2014-01-23 DIAGNOSIS — I10 Essential (primary) hypertension: Secondary | ICD-10-CM | POA: Diagnosis not present

## 2014-01-23 DIAGNOSIS — I491 Atrial premature depolarization: Secondary | ICD-10-CM

## 2014-01-23 DIAGNOSIS — I359 Nonrheumatic aortic valve disorder, unspecified: Secondary | ICD-10-CM | POA: Diagnosis not present

## 2014-01-23 LAB — TROPONIN I
Troponin I: <0.3 ng/mL (ref <0.30)
Troponin I: <0.3 ng/mL (ref <0.30)

## 2014-01-23 LAB — CBC
HCT: 37 % (ref 36.0–46.0)
Hemoglobin: 12.3 g/dL (ref 12.0–15.0)
MCH: 30.4 pg (ref 26.0–34.0)
MCHC: 33.2 g/dL (ref 30.0–36.0)
MCV: 91.4 fL (ref 78.0–100.0)
Platelets: 257 10*3/uL (ref 150–400)
RBC: 4.05 MIL/uL (ref 3.87–5.11)
RDW: 14.6 % (ref 11.5–15.5)
WBC: 10.4 10*3/uL (ref 4.0–10.5)

## 2014-01-23 LAB — BASIC METABOLIC PANEL
BUN: 25 mg/dL — ABNORMAL HIGH (ref 6–23)
CO2: 24 mEq/L (ref 19–32)
Calcium: 8.4 mg/dL (ref 8.4–10.5)
Chloride: 103 mEq/L (ref 96–112)
Creatinine, Ser: 1.34 mg/dL — ABNORMAL HIGH (ref 0.50–1.10)
GFR calc Af Amer: 41 mL/min — ABNORMAL LOW (ref >90)
GFR calc non Af Amer: 35 mL/min — ABNORMAL LOW (ref >90)
Glucose, Bld: 95 mg/dL (ref 70–99)
Potassium: 4.9 mEq/L (ref 3.7–5.3)
Sodium: 138 mEq/L (ref 137–147)

## 2014-01-23 MED ORDER — LEVOFLOXACIN 250 MG PO TABS: 250.0000 mg | ORAL_TABLET | Freq: Every day | ORAL | Status: DC

## 2014-01-23 NOTE — Discharge Summary (Signed)
[name], is a 80 y.o. female  DOB 1926-11-27  MRN [id number].  Admission date:  01/22/2014  Admitting Physician  Thurnell Lose, MD  Discharge Date:  01/23/2014   Primary MD  Rachell Cipro, MD  Recommendations for primary care physician for things to follow:   Follow orthostatics and blood pressure   Admission Diagnosis  Aortic stenosis [424.1] Leg edema [782.3] HTN (hypertension) [401.9] Near syncope [780.2] Aortic stenosis, mild [424.1] Venous insufficiency of both lower extremities [459.81]   Discharge Diagnosis  Aortic stenosis [424.1] Leg edema [782.3] HTN (hypertension) [401.9] Near syncope [780.2] Aortic stenosis, mild [424.1] Venous insufficiency of both lower extremities [459.81]     Principal Problem:   Syncope Active Problems:   Aortic stenosis   HTN (hypertension)   Leg edema   PAC (premature atrial contraction)   Atrial tachycardia, paroxysmal      Past Medical History  Diagnosis Date  . HTN (hypertension)   . HLD (hyperlipidemia)   . Hypothyroidism   . Aortic stenosis, moderate     last echo in 2010; AVA 1.1; mild AS per echo in June 2013  . Edema of both legs     s/p laser treatment per Dr. Donnetta Hutching    Past Surgical History  Procedure Laterality Date  . Appendectomy  1955  . Foot surgery  2003    bilateral Hammer Toe  . Eye surgery  2005    Bilateral cataract  . Endovenous ablation saphenous vein w/ laser  08-29-2012    left greater saphenous vein   Curt Jews MD  . Endovenous ablation saphenous vein w/ laser  09-19-2012    right greater saphenous vein by Curt Jews MD  . Stab phlebectomy Right 01-02-2013    10-15 incisions right thigh and calf by Curt Jews MD     Discharge Condition: Stable   Follow UP  Follow-up Information   Follow up with [facility], MD.  Schedule an appointment as soon as possible for a visit in 3 days.   Specialty:  Family Medicine   Contact information:   [place] [address] [phone]       Follow up with Darden Amber., MD. Schedule an appointment as soon as possible for a visit in 1 week.   Specialty:  Cardiology   Contact information:   [address] [address] [phone]         Discharge Instructions  and  Discharge Medications      Discharge Orders   Future Appointments Provider Department Dept Phone   04/01/2014 9:30 AM Thayer Headings, MD [facility] [phone]   Future Orders Complete By Expires   Diet - low sodium heart healthy  As directed    Discharge instructions  As directed    Comments:     Follow with Primary MD DEWEY,ELIZABETH, MD in 7 days   Get CBC, CMP, checked 7 days by Primary MD and again as instructed by your  Primary MD. Get a 2 view Chest X ray done next visit .   Activity: As tolerated with Full fall precautions use walker/cane & assistance as needed  You must sit at a stationary position for 5 minutes and do leg extension exercises as taught for 5 minutes before you start walking from a resting position or after getting up from a bed, once you stand up , stand at that spot for 3-5 minutes while holding on to a wall-bed-heavy furniture and then walk only if you are not dizzy, using a  walker at all times, if you still get dizzy sit down, and call for help.   Disposition Home    Diet: Heart Healthy    For Heart failure patients - Check your Weight same time everyday, if you gain over 2 pounds, or you develop in leg swelling, experience more shortness of breath or chest pain, call your Primary MD immediately. Follow Cardiac Low Salt Diet and 1.8 lit/day fluid restriction.   On your next visit with her primary care physician please Get Medicines reviewed and adjusted.  Please request your Prim.MD to  go over all Hospital Tests and Procedure/Radiological results at the follow up, please get all Hospital records sent to your Prim MD by signing hospital release before you go home.   If you experience worsening of your admission symptoms, develop shortness of breath, life threatening emergency, suicidal or homicidal thoughts you must seek medical attention immediately by calling 911 or calling your MD immediately  if symptoms less severe.  You Must read complete instructions/literature along with all the possible adverse reactions/side effects for all the Medicines you take and that have been prescribed to you. Take any new Medicines after you have completely understood and accpet all the possible adverse reactions/side effects.   Do not drive and provide baby sitting services if your were admitted for syncope or siezures until you have seen by Primary MD or a Neurologist and advised to do so again.  Do not drive when taking Pain medications.    Do not take more than prescribed Pain, Sleep and Anxiety Medications  Special Instructions: If you have smoked or chewed Tobacco  in the last 2 yrs please stop smoking, stop any regular Alcohol  and or any Recreational drug use.  Wear Seat belts while driving.   Please note  You were cared for by a hospitalist during your hospital stay. If you have any questions about your discharge medications or the care you received while you were in the hospital after you are discharged, you can call the unit and asked to speak with the hospitalist on call if the hospitalist that took care of you is not available. Once you are discharged, your primary care physician will handle any further medical issues. Please note that NO REFILLS for any discharge medications will be authorized once you are discharged, as it is imperative that you return to your primary care physician (or establish a relationship with a primary care physician if you do not have one) for your  aftercare needs so that they can reassess your need for medications and monitor your lab values.   Increase activity slowly  As directed        Medication List    STOP taking these medications       ZITHROMAX Z-PAK PO      TAKE these medications       acetaminophen 500 MG tablet  Commonly known as:  TYLENOL  Take 500  mg by mouth every 6 (six) hours as needed (pain).     calcium carbonate 600 MG Tabs tablet  Commonly known as:  OS-CAL  Take 1,800 mg by mouth daily.     dicyclomine 20 MG tablet  Commonly known as:  BENTYL  Take 20 mg by mouth every 6 (six) hours as needed. For stomach irritability.     fish oil-omega-3 fatty acids 1000 MG capsule  Take 1 g by mouth 3 (three) times daily.     levofloxacin 250 MG tablet  Commonly known as:  LEVAQUIN  Take 1 tablet (250 mg total) by mouth daily.     levothyroxine 75 MCG tablet  Commonly known as:  SYNTHROID, LEVOTHROID  Take 75 mcg by mouth daily.     medroxyPROGESTERone 2.5 MG tablet  Commonly known as:  PROVERA  Take 2.5 mg by mouth daily. For 10 days each month     olmesartan 40 MG tablet  Commonly known as:  BENICAR  Take 40 mg by mouth daily.          Diet and Activity recommendation: See Discharge Instructions above   Consults obtained - cardiology   Major procedures and Radiology Reports - PLEASE review detailed and final reports for all details, in brief -       Dg Chest 2 View  01/22/2014   CLINICAL DATA:  Near syncope  EXAM: CHEST  2 VIEW  COMPARISON:  Insert priors  FINDINGS: Normal heart size and upper mediastinal contours. There is a moderate to large hiatal hernia which appears stable in size/distention. Streaky perihilar densities likely reflecting scarring and bronchitic changes given stability from prior. Negative for edema, consolidation, effusion, or pneumothorax.  IMPRESSION: 1. Stable exam. 2. Bronchitic changes and perihilar atelectasis or scarring. 3. Hiatal hernia.   Electronically  Signed   By: Jorje Guild M.D.   On: 01/22/2014 14:11   Dg Chest 2 View  01/19/2014   CLINICAL DATA:  Cough, wheezing, congestion.  EXAM: CHEST  2 VIEW  COMPARISON:  01/22/2012  FINDINGS: Moderate-sized hiatal hernia. Heart is normal size. Chronic lingular opacity, likely scarring. No definite acute airspace opacity or effusion. No acute bony abnormality.  IMPRESSION: Hiatal hernia.  Lingular scarring.  No definite acute process.   Electronically Signed   By: Rolm Baptise M.D.   On: 01/19/2014 12:21   Ct Head Wo Contrast  01/22/2014   CLINICAL DATA:  Syncope, loss of consciousness. Altered mental status.  EXAM: CT HEAD WITHOUT CONTRAST  TECHNIQUE: Contiguous axial images were obtained from the base of the skull through the vertex without intravenous contrast.  COMPARISON:  None available for comparison at time of study interpretation.  FINDINGS: The ventricles and sulci are normal for age. No intraparenchymal hemorrhage, mass effect nor midline shift. Patchy supratentorial white matter hypodensities are within normal range for patient's age and though non-specific suggest sequelae of chronic small vessel ischemic disease. No acute large vascular territory infarcts.  No abnormal extra-axial fluid collections. Basal cisterns are patent. Mild to moderate calcific atherosclerosis of the carotid siphons and to lesser extent included vertebral arteries.  No skull fracture. Visualized paranasal sinuses and mastoid aircells are well-aerated. The included ocular globes and orbital contents are non-suspicious.  IMPRESSION: No acute intracranial process.  Involutional changes. Moderate white matter changes suggest chronic small vessel ischemic disease.   Electronically Signed   By: Elon Alas   On: 01/22/2014 16:51    Micro Results      No results  found for this or any previous visit (from the past 240 hour(s)).   History of present illness and  Hospital Course:     Kindly see H&P for history of  present illness and admission details, please review complete Labs, Consult reports and Test reports for all details in brief [name], is a 81 y.o. female, patient with history of  mild aortic stenosis per echo gram and 2013, varicose veins, hypothyroidism, hypertension, dyslipidemia who was at the hairdresser getting her hair done, was in a sitting position and subsequently passed out and lost consciousness for a few minutes, she did say she had a prodrome feeling of feeling weak, no headache or chest pain prior to this, no loss of bowel or bladder control, no jerky movements her tongue bites, did not have any palpitations, no aura, never had similar problem before, no focal weakness, she does confirm of her ongoing cough for the last week for which she was recently started on Z-Pak yesterday without much benefit, she has some exertional shortness of breath for the last 1 week, no sick contacts no fever chills.     Syncope. Was thought to be secondary to orthostasis, she was seen by cardiology underwent echogram and head CT which were unremarkable, echogram shows mild aortic stenosis, mild LVH, EF 60%. Serial troponins negative, seen by cardiology and cleared for home discharge on home medications with TED stockings. She will follow with her primary cardiologist Dr. Acie Fredrickson in one week's time for possible further outpatient testing which might include stress test and event monitor. She symptom-free now, written instructions have been provided on ambulation with orthostasis.   Recent URI. Switch to Levaquin with good effect, give 5 more days of oral Levaquin.    Hypothyroidism continue home dose Synthroid TSH was 2.6.    His lipidemia continue home medications unchanged.    Hypertension continue home medications unchanged per cardiology. She is allergic to beta blocker, will follow with PCP and cardiologist within a week and gets medications monitored and adjusted as  needed.     Today   Subjective:   [name] today has no headache,no chest abdominal pain,no new weakness tingling or numbness, feels much better wants to go home today.    Objective:   Blood pressure 128/70, pulse 78, temperature 98 F (36.7 C), temperature source Oral, resp. rate 20, height 5' (1.524 m), weight 68.266 kg (150 lb 8 oz), SpO2 100.00%.   Intake/Output Summary (Last 24 hours) at 01/23/14 0855 Last data filed at 01/23/14 0824  Gross per 24 hour  Intake  732.5 ml  Output      0 ml  Net  732.5 ml    Exam Awake Alert, Oriented *3, No new F.N deficits, Normal affect Violet.AT,PERRAL Supple Neck,No JVD, No cervical lymphadenopathy appriciated.  Symmetrical Chest wall movement, Good air movement bilaterally, CTAB RRR,No Gallops,Rubs or new Murmurs, No Parasternal Heave +ve B.Sounds, Abd Soft, Non tender, No organomegaly appriciated, No rebound -guarding or rigidity. No Cyanosis, Clubbing or edema, No new Rash or bruise  Data Review   CBC w Diff: Lab Results  Component Value Date   WBC 10.4 01/23/2014   HGB 12.3 01/23/2014   HCT 37.0 01/23/2014   PLT 257 01/23/2014   LYMPHOPCT 21 01/22/2012   MONOPCT 6 01/22/2012   EOSPCT 1 01/22/2012   BASOPCT 0 01/22/2012    CMP: Lab Results  Component Value Date   NA 138 01/23/2014   K 4.9 01/23/2014   CL 103  01/23/2014   CO2 24 01/23/2014   BUN 25* 01/23/2014   CREATININE 1.34* 01/23/2014   PROT 6.7 09/30/2012   ALBUMIN 3.3* 09/30/2012   BILITOT 0.7 09/30/2012   ALKPHOS 55 09/30/2012   AST 22 09/30/2012   ALT 11 09/30/2012  .   Total Time in preparing paper work, data evaluation and todays exam - 35 minutes  Thurnell Lose M.D on 01/23/2014 at 8:55 AM  [facility] Office  [phone]

## 2014-01-23 NOTE — Care Management Note (Signed)
    Page 1 of 1   01/23/2014     11:14:34 AM   CARE MANAGEMENT NOTE 01/23/2014  Patient:  [name], [name]   Account Number:  [id number]  Date Initiated:  01/23/2014  Documentation initiated by:  Dessa Phi  Subjective/Objective Assessment:   80 Y/O F ADMITTED W/SYNCOPE.     Action/Plan:   FROM HOME.   Anticipated DC Date:  01/23/2014   Anticipated DC Plan:  [place]  CM consult      Choice offered to / List presented to:             Status of service:  Completed, signed off Medicare Important Message given?   (If response is "NO", the following Medicare IM given date fields will be blank) Date Medicare IM given:   Date Additional Medicare IM given:    Discharge Disposition:  HOME/SELF CARE  Per UR Regulation:  Reviewed for med. necessity/level of care/duration of stay  If discussed at [place] of Stay Meetings, dates discussed:    Comments:  01/23/14 Meryl Ponder RN,BSN NCM [phone] D/C HOME NO [place].

## 2014-01-23 NOTE — Discharge Instructions (Signed)
Follow with Primary MD DEWEY,ELIZABETH, MD in 7 days   Get CBC, CMP, checked 7 days by Primary MD and again as instructed by your Primary MD. Get a 2 view Chest X ray done next visit .   Activity: As tolerated with Full fall precautions use walker/cane & assistance as needed  You must sit at a stationary position for 5 minutes and do leg extension exercises as taught for 5 minutes before you start walking from a resting position or after getting up from a bed, once you stand up , stand at that spot for 3-5 minutes while holding on to a wall-bed-heavy furniture and then walk only if you are not dizzy, using a  walker at all times, if you still get dizzy sit down, and call for help.   Disposition Home    Diet: Heart Healthy    For Heart failure patients - Check your Weight same time everyday, if you gain over 2 pounds, or you develop in leg swelling, experience more shortness of breath or chest pain, call your Primary MD immediately. Follow Cardiac Low Salt Diet and 1.8 lit/day fluid restriction.   On your next visit with her primary care physician please Get Medicines reviewed and adjusted.  Please request your Prim.MD to go over all Hospital Tests and Procedure/Radiological results at the follow up, please get all Hospital records sent to your Prim MD by signing hospital release before you go home.   If you experience worsening of your admission symptoms, develop shortness of breath, life threatening emergency, suicidal or homicidal thoughts you must seek medical attention immediately by calling 911 or calling your MD immediately  if symptoms less severe.  You Must read complete instructions/literature along with all the possible adverse reactions/side effects for all the Medicines you take and that have been prescribed to you. Take any new Medicines after you have completely understood and accpet all the possible adverse reactions/side effects.   Do not drive and provide baby sitting  services if your were admitted for syncope or siezures until you have seen by Primary MD or a Neurologist and advised to do so again.  Do not drive when taking Pain medications.    Do not take more than prescribed Pain, Sleep and Anxiety Medications  Special Instructions: If you have smoked or chewed Tobacco  in the last 2 yrs please stop smoking, stop any regular Alcohol  and or any Recreational drug use.  Wear Seat belts while driving.   Please note  You were cared for by a hospitalist during your hospital stay. If you have any questions about your discharge medications or the care you received while you were in the hospital after you are discharged, you can call the unit and asked to speak with the hospitalist on call if the hospitalist that took care of you is not available. Once you are discharged, your primary care physician will handle any further medical issues. Please note that NO REFILLS for any discharge medications will be authorized once you are discharged, as it is imperative that you return to your primary care physician (or establish a relationship with a primary care physician if you do not have one) for your aftercare needs so that they can reassess your need for medications and monitor your lab values.

## 2014-01-23 NOTE — Progress Notes (Addendum)
SUBJECTIVE:  No further dizziness  OBJECTIVE:   Vitals:   Filed Vitals:   01/23/14 0500 01/23/14 0528 01/23/14 0530 01/23/14 0532  BP:  154/82 151/71 128/70  Pulse:  70 75 78  Temp:  98 F (36.7 C)    TempSrc:  Oral    Resp:  20    Height:      Weight: 150 lb 8 oz (68.266 kg)     SpO2:  100%     I&O's:   Intake/Output Summary (Last 24 hours) at 01/23/14 0816 Last data filed at 01/22/14 1856  Gross per 24 hour  Intake  492.5 ml  Output      0 ml  Net  492.5 ml   TELEMETRY: Reviewed telemetry pt in NSR with PAC's and nonsustained atrial tachycardia up to 6 beats     PHYSICAL EXAM General: Well developed, well nourished, in no acute distress Head: Eyes PERRLA, No xanthomas.   Normal cephalic and atramatic  Lungs:   Clear bilaterally to auscultation and percussion. Heart:   HRRR S1 S2 Pulses are 2+ & equal. Abdomen: Bowel sounds are positive, abdomen soft and non-tender without masses  Extremities:   No clubbing, cyanosis or edema.  DP +1 Neuro: Alert and oriented X 3. Psych:  Good affect, responds appropriately   LABS: Basic Metabolic Panel:  Recent Labs  01/22/14 1250 01/22/14 1754 01/23/14 0350  NA 139  --  138  K 4.4  --  4.9  CL 99  --  103  CO2 28  --  24  GLUCOSE 102*  --  95  BUN 33*  --  25*  CREATININE 1.41* 1.24* 1.34*  CALCIUM 8.9  --  8.4   Liver Function Tests: No results found for this basename: AST, ALT, ALKPHOS, BILITOT, PROT, ALBUMIN,  in the last 72 hours No results found for this basename: LIPASE, AMYLASE,  in the last 72 hours CBC:  Recent Labs  01/22/14 1754 01/23/14 0350  WBC 10.3 10.4  HGB 12.7 12.3  HCT 37.9 37.0  MCV 91.3 91.4  PLT 281 257   Cardiac Enzymes:  Recent Labs  01/22/14 1754 01/22/14 2340 01/23/14 0350  TROPONINI <0.30 <0.30 <0.30   BNP: No components found with this basename: POCBNP,  D-Dimer: No results found for this basename: DDIMER,  in the last 72 hours Hemoglobin A1C: No results found for  this basename: HGBA1C,  in the last 72 hours Fasting Lipid Panel: No results found for this basename: CHOL, HDL, LDLCALC, TRIG, CHOLHDL, LDLDIRECT,  in the last 72 hours Thyroid Function Tests:  Recent Labs  01/22/14 1754  TSH 2.401   Anemia Panel: No results found for this basename: VITAMINB12, FOLATE, FERRITIN, TIBC, IRON, RETICCTPCT,  in the last 72 hours Coag Panel:   Lab Results  Component Value Date   INR 0.92 08/19/2009    RADIOLOGY: Dg Chest 2 View  01/22/2014   CLINICAL DATA:  Near syncope  EXAM: CHEST  2 VIEW  COMPARISON:  Insert priors  FINDINGS: Normal heart size and upper mediastinal contours. There is a moderate to large hiatal hernia which appears stable in size/distention. Streaky perihilar densities likely reflecting scarring and bronchitic changes given stability from prior. Negative for edema, consolidation, effusion, or pneumothorax.  IMPRESSION: 1. Stable exam. 2. Bronchitic changes and perihilar atelectasis or scarring. 3. Hiatal hernia.   Electronically Signed   By: Jorje Guild M.D.   On: 01/22/2014 14:11   Dg Chest 2 View  01/19/2014  CLINICAL DATA:  Cough, wheezing, congestion.  EXAM: CHEST  2 VIEW  COMPARISON:  01/22/2012  FINDINGS: Moderate-sized hiatal hernia. Heart is normal size. Chronic lingular opacity, likely scarring. No definite acute airspace opacity or effusion. No acute bony abnormality.  IMPRESSION: Hiatal hernia.  Lingular scarring.  No definite acute process.   Electronically Signed   By: Rolm Baptise M.D.   On: 01/19/2014 12:21   Ct Head Wo Contrast  01/22/2014   CLINICAL DATA:  Syncope, loss of consciousness. Altered mental status.  EXAM: CT HEAD WITHOUT CONTRAST  TECHNIQUE: Contiguous axial images were obtained from the base of the skull through the vertex without intravenous contrast.  COMPARISON:  None available for comparison at time of study interpretation.  FINDINGS: The ventricles and sulci are normal for age. No intraparenchymal  hemorrhage, mass effect nor midline shift. Patchy supratentorial white matter hypodensities are within normal range for patient's age and though non-specific suggest sequelae of chronic small vessel ischemic disease. No acute large vascular territory infarcts.  No abnormal extra-axial fluid collections. Basal cisterns are patent. Mild to moderate calcific atherosclerosis of the carotid siphons and to lesser extent included vertebral arteries.  No skull fracture. Visualized paranasal sinuses and mastoid aircells are well-aerated. The included ocular globes and orbital contents are non-suspicious.  IMPRESSION: No acute intracranial process.  Involutional changes. Moderate white matter changes suggest chronic small vessel ischemic disease.   Electronically Signed   By: Elon Alas   On: 01/22/2014 16:51    ASSESSMENT/PLAN: 1.  Syncope - most likely orthostatic.  She had been sitting in a chair leaning back to get her hair washed and then got up to sit in another chair and her symptoms of dizziness occurred.  She has a history of venous insufficiency requiring extensive laser surgery on her LE veins in the past.  2D echo with normal LVF.   Her SBP dropped 32mmHg from sitting to standing on orthostatics this am.  She has not had any chest pain so I do not think she needs a nuclear stress test.  Monitor shows NSR with frequent PAC's and short bursts of nonsustained atrial tachycardia up to 6 beats. There was on episode of WCT for 5 beats that could be atrial tachycardia with aberration vs. NSVT.  Her LVF is normal.  Would recommend TED hose compression stockings and outpt event monitor to rule out arrhythmias 2.  Aortic stenosis - very mild by recent echo 3.  HTN - fairly controlled 4.  PAC's and short bursts of nonsustained atrial tachycardia.  Would not treat at this time with beta blocker or CCB since she is asymptomatic and this could block a tachycardic response to orthostatic  hypotension.      Sueanne Margarita, MD  01/23/2014  8:16 AM

## 2014-01-27 ENCOUNTER — Telehealth: Payer: Self-pay | Admitting: *Deleted

## 2014-01-27 NOTE — Telephone Encounter (Signed)
PA to [facility] for patients [facility]

## 2014-02-10 ENCOUNTER — Ambulatory Visit (INDEPENDENT_AMBULATORY_CARE_PROVIDER_SITE_OTHER): Payer: Medicare Other | Admitting: Nurse Practitioner

## 2014-02-10 ENCOUNTER — Encounter (INDEPENDENT_AMBULATORY_CARE_PROVIDER_SITE_OTHER): Payer: Medicare Other

## 2014-02-10 ENCOUNTER — Encounter: Payer: Self-pay | Admitting: Nurse Practitioner

## 2014-02-10 VITALS — BP 140/80 | HR 68 | Ht 60.0 in | Wt 152.0 lb

## 2014-02-10 DIAGNOSIS — R55 Syncope and collapse: Secondary | ICD-10-CM

## 2014-02-10 DIAGNOSIS — M1990 Unspecified osteoarthritis, unspecified site: Secondary | ICD-10-CM | POA: Diagnosis not present

## 2014-02-10 DIAGNOSIS — I10 Essential (primary) hypertension: Secondary | ICD-10-CM | POA: Diagnosis not present

## 2014-02-10 DIAGNOSIS — M069 Rheumatoid arthritis, unspecified: Secondary | ICD-10-CM | POA: Diagnosis not present

## 2014-02-10 DIAGNOSIS — Z79899 Other long term (current) drug therapy: Secondary | ICD-10-CM | POA: Diagnosis not present

## 2014-02-10 DIAGNOSIS — M2550 Pain in unspecified joint: Secondary | ICD-10-CM | POA: Diagnosis not present

## 2014-02-10 DIAGNOSIS — R109 Unspecified abdominal pain: Secondary | ICD-10-CM | POA: Diagnosis not present

## 2014-02-10 NOTE — Patient Instructions (Addendum)
We will schedule carotid dopplers  We will arrange for [facility]  We will place event monitor  Samples of benicar if available  You should minimize your driving while this evaluation is underway  See Dr. Acie Fredrickson back as planned  Call the [place] office at [phone] if you have any questions, problems or concerns.

## 2014-02-10 NOTE — Progress Notes (Addendum)
[place] Date of Birth: 1927-05-28 Medical Record #[id number]  History of Present Illness: [name] is seen back today for a post hospital visit. Seen for Dr. Acie Fredrickson. She has HTN, chronic leg edema, mild AS and HLD. EF is normal at 55 to 60% from June 2013.  Last seen in June of 2014 by Dr. Acie Fredrickson.   Presented to the hospital on 01/22/14 with a syncopal spell while at the hairdresser's getting her hair done. Was sitting and subsequently passed out - lost consciousness for a few minutes - felt weak prior but no other symptoms. Had had a cough for a week prior - on Zithromax - had CT head and echo. Was cleared to go home and referred back here for further testing. Not clear by the EPIC chart if her carotid dopplers were completed.   Comes in today. Here alone. Still with some bronchitis/cough - says it is getting better. Not dizzy or lightheaded. She had some warning prior to her passing out - she was sitting and having her hair cut. She did not have her head in the bowl. Says she is "being careful". No recurrent syncope. She is driving. No chest pain. Her carotid doppler was not done. She is out of her Benicar - not on her formulary - needs to try losartan, diovan or micardis. Some swelling of her legs - using her support stockings. Asking about seeing Dr. Donnetta Hutching again due to some left leg discomfort.    Current Outpatient Prescriptions  Medication Sig Dispense Refill  . acetaminophen (TYLENOL) 500 MG tablet Take 500 mg by mouth every 6 (six) hours as needed (pain).      . calcium carbonate (OS-CAL) 600 MG TABS Take 1,800 mg by mouth daily.      Marland Kitchen dicyclomine (BENTYL) 20 MG tablet Take 20 mg by mouth every 6 (six) hours as needed. For stomach irritability.      Marland Kitchen estrogens, conjugated, (PREMARIN) 0.625 MG tablet Take 0.625 mg by mouth daily. Take daily for 21 days then do not take for 7 days.      Marland Kitchen ezetimibe-simvastatin (VYTORIN) 10-40 MG per tablet Take 1 tablet by mouth at bedtime.       Marland Kitchen levothyroxine (SYNTHROID, LEVOTHROID) 75 MCG tablet Take 75 mcg by mouth daily.        . medroxyPROGESTERone (PROVERA) 2.5 MG tablet Take 5 mg by mouth daily. For 10 days each month      . olmesartan (BENICAR) 40 MG tablet Take 40 mg by mouth daily.      . [DISCONTINUED] losartan-hydrochlorothiazide (HYZAAR) 100-12.5 MG per tablet Take 1 tablet by mouth daily.  30 tablet  11   No current facility-administered medications for this visit.    Allergies  Allergen Reactions  . Aspirin Nausea And Vomiting  . Codeine Nausea And Vomiting    "Deathly Sick"  . Coreg [Carvedilol] Other (See Comments)    Fatigue/ ill  . Penicillins Rash    Past Medical History  Diagnosis Date  . HTN (hypertension)   . HLD (hyperlipidemia)   . Hypothyroidism   . Aortic stenosis, moderate     last echo in 2010; AVA 1.1; mild AS per echo in June 2013  . Edema of both legs     s/p laser treatment per Dr. Donnetta Hutching    Past Surgical History  Procedure Laterality Date  . Appendectomy  1955  . Foot surgery  2003    bilateral Hammer Toe  . Eye surgery  2005    Bilateral cataract  . Endovenous ablation saphenous vein w/ laser  08-29-2012    left greater saphenous vein   Curt Jews MD  . Endovenous ablation saphenous vein w/ laser  09-19-2012    right greater saphenous vein by Curt Jews MD  . Stab phlebectomy Right 01-02-2013    10-15 incisions right thigh and calf by Curt Jews MD    History  Smoking status  . Former Smoker  . Types: Cigarettes  . Quit date: 10/31/1983  Smokeless tobacco  . Never Used    History  Alcohol Use No    Family History  Problem Relation Age of Onset  . Coronary artery disease    . Peripheral vascular disease    . Heart attack    . Heart disease Mother   . Peripheral vascular disease Mother   . Heart disease Father   . Peripheral vascular disease Father     Right leg amputation  . Hypertension Sister   . Heart disease Brother 58    Heart Disease before age  66    Review of Systems: The review of systems is per the HPI.  All other systems were reviewed and are negative.  Physical Exam: BP 140/80  Pulse 68  Ht 5' (1.524 m)  Wt 152 lb (68.947 kg)  BMI 29.69 kg/m2  SpO2 100% Patient is very pleasant and in no acute distress. Skin is warm and dry. Color is normal.  HEENT is unremarkable. Normocephalic/atraumatic. PERRL. Sclera are nonicteric. Neck is supple. No masses. No JVD. Lungs are clear. Cardiac exam shows a regular rate and rhythm. Abdomen is soft. Extremities are without edema. Gait and ROM are intact. No gross neurologic deficits noted.  LABORATORY DATA: Lab Results  Component Value Date   WBC 10.4 01/23/2014   HGB 12.3 01/23/2014   HCT 37.0 01/23/2014   PLT 257 01/23/2014   GLUCOSE 95 01/23/2014   CHOL 175 09/30/2012   TRIG 130.0 09/30/2012   HDL 69.30 09/30/2012   LDLDIRECT 164.2 03/22/2011   LDLCALC 80 09/30/2012   ALT 11 09/30/2012   AST 22 09/30/2012   NA 138 01/23/2014   K 4.9 01/23/2014   CL 103 01/23/2014   CREATININE 1.34* 01/23/2014   BUN 25* 01/23/2014   CO2 24 01/23/2014   TSH 2.401 01/22/2014   INR 0.92 08/19/2009   Echo Study Conclusions from March 2015  - Left ventricle: The cavity size was normal. Wall thickness was increased in a pattern of mild LVH. Systolic function was normal. The estimated ejection fraction was in the range of 60% to 65%. Wall motion was normal; there were no regional wall motion abnormalities. The study is not technically sufficient to allow evaluation of LV diastolic function. - Aortic valve: There was very mild stenosis. Valve area: 1.85cm^2(VTI). Valve area: 1.58cm^2 (Vmax).  Dg Chest 2 View    IMPRESSION: 1. Stable exam. 2. Bronchitic changes and perihilar atelectasis or scarring. 3. Hiatal hernia.   Electronically Signed   By: Jorje Guild M.D.   On: 01/22/2014 14:11    Ct Head Wo Contrast   IMPRESSION: No acute intracranial process.  Involutional changes. Moderate white matter  changes suggest chronic small vessel ischemic disease.   Electronically Signed   By: Elon Alas   On: 01/22/2014 16:51     Assessment / Plan: 1. Syncope - arrange for carotid doppler, Myoview and event monitor. I did recommend that she not drive over this next month.  2. HTN - only on Benicar now for her BP - not on her approved formulary - now out of - will try to give her samples - will have to try one of the alternative choices first according to her plan.  3. Chronic lower extremity edema  4. Cough/bronchitis - no fever. Lungs are clear today. She has had multiple rounds of antibiotics.   See back as planned unless her studies dictate otherwise.   Patient is agreeable to this plan and will call if any problems develop in the interim.   Burtis Junes, RN, [facility] [address] [place], [place]  [address] [phone]    Addendum: Received message on 02/26/14 from [facility]: Patient cancelled [place] appointment. She said she was too claustrophobic, even with the choice of taking Valium, she declined.

## 2014-02-17 ENCOUNTER — Other Ambulatory Visit: Payer: Self-pay | Admitting: *Deleted

## 2014-02-17 ENCOUNTER — Encounter: Payer: Self-pay | Admitting: Cardiology

## 2014-02-17 ENCOUNTER — Telehealth: Payer: Self-pay | Admitting: *Deleted

## 2014-02-17 ENCOUNTER — Ambulatory Visit (HOSPITAL_COMMUNITY): Payer: Medicare Other | Attending: Cardiology | Admitting: Cardiology

## 2014-02-17 DIAGNOSIS — I83893 Varicose veins of bilateral lower extremities with other complications: Secondary | ICD-10-CM

## 2014-02-17 DIAGNOSIS — I6529 Occlusion and stenosis of unspecified carotid artery: Secondary | ICD-10-CM

## 2014-02-17 DIAGNOSIS — R55 Syncope and collapse: Secondary | ICD-10-CM

## 2014-02-17 DIAGNOSIS — M79609 Pain in unspecified limb: Secondary | ICD-10-CM

## 2014-02-17 DIAGNOSIS — M7989 Other specified soft tissue disorders: Secondary | ICD-10-CM

## 2014-02-17 NOTE — Progress Notes (Signed)
Carotid duplex complete 

## 2014-02-17 NOTE — Telephone Encounter (Signed)
Mrs. [name] c/o left foot and ankle swelling for approximately 3 weeks.  States left foot and ankle swelling are worse after prolonged standing.   States swelling and pain in left foot and ankle are worse at night.  Mrs. Fahey is s/p endovenous laser ablation left greater saphenous vein 08-29-2012 and s/p endovenous laser ablation right greater saphenous vein 09-19-2012.  Appointment for venous reflux study(left leg) and VV FU with Dr. Donnetta Hutching scheduled for 02-19-2014.  Order for venous reflux study entered in EPIC.

## 2014-02-18 ENCOUNTER — Encounter: Payer: Self-pay | Admitting: Vascular Surgery

## 2014-02-19 ENCOUNTER — Encounter (HOSPITAL_COMMUNITY): Payer: Medicare Other

## 2014-02-19 ENCOUNTER — Ambulatory Visit (INDEPENDENT_AMBULATORY_CARE_PROVIDER_SITE_OTHER): Payer: Medicare Other | Admitting: Vascular Surgery

## 2014-02-19 ENCOUNTER — Ambulatory Visit (HOSPITAL_COMMUNITY)
Admission: RE | Admit: 2014-02-19 | Discharge: 2014-02-19 | Disposition: A | Discharge status: Home / Self Care | Payer: Medicare Other | Source: Ambulatory Visit | Attending: Vascular Surgery | Admitting: Vascular Surgery

## 2014-02-19 ENCOUNTER — Encounter: Payer: Self-pay | Admitting: Vascular Surgery

## 2014-02-19 VITALS — BP 181/91 | HR 72 | Resp 18 | Ht 60.0 in | Wt 151.8 lb

## 2014-02-19 DIAGNOSIS — M79609 Pain in unspecified limb: Secondary | ICD-10-CM

## 2014-02-19 DIAGNOSIS — I83893 Varicose veins of bilateral lower extremities with other complications: Secondary | ICD-10-CM | POA: Diagnosis not present

## 2014-02-19 DIAGNOSIS — M7989 Other specified soft tissue disorders: Secondary | ICD-10-CM | POA: Diagnosis not present

## 2014-02-19 NOTE — Progress Notes (Signed)
Here today for discussion regarding discomfort in her left leg. She is very pleasant 80 year old the female known to me from prior bilateral staged great saphenous vein ablation for superficial venous hypertension. She reports that the swelling she had had preprocedure has resolved. She was concerned regarding several months of discomfort in her left leg. She reports this begins at the level of the knee and extends down to her left calf. She has not noted any particular swelling. She reports that this is improved with elevation but does not sense any improvement with compression which he does wear.  Past Medical History  Diagnosis Date  . HTN (hypertension)   . HLD (hyperlipidemia)   . Hypothyroidism   . Aortic stenosis, moderate     last echo in 2010; AVA 1.1; mild AS per echo in June 2013  . Edema of both legs     s/p laser treatment per Dr. Donnetta Hutching    History  Substance Use Topics  . Smoking status: Former Smoker    Types: Cigarettes    Quit date: 10/31/1983  . Smokeless tobacco: Never Used  . Alcohol Use: No    Family History  Problem Relation Age of Onset  . Coronary artery disease    . Peripheral vascular disease    . Heart attack    . Heart disease Mother   . Peripheral vascular disease Mother   . Heart disease Father   . Peripheral vascular disease Father     Right leg amputation  . Hypertension Sister   . Heart disease Brother 89    Heart Disease before age 24    Allergies  Allergen Reactions  . Aspirin Nausea And Vomiting  . Codeine Nausea And Vomiting    "Deathly Sick"  . Coreg [Carvedilol] Other (See Comments)    Fatigue/ ill  . Penicillins Rash    Current outpatient prescriptions:acetaminophen (TYLENOL) 500 MG tablet, Take 500 mg by mouth every 6 (six) hours as needed (pain)., Disp: , Rfl: ;  calcium carbonate (OS-CAL) 600 MG TABS, Take 1,800 mg by mouth daily., Disp: , Rfl: ;  dicyclomine (BENTYL) 20 MG tablet, Take 20 mg by mouth every 6 (six) hours as  needed. For stomach irritability., Disp: , Rfl:  estrogens, conjugated, (PREMARIN) 0.625 MG tablet, Take 0.625 mg by mouth daily. Take daily for 21 days then do not take for 7 days., Disp: , Rfl: ;  ezetimibe-simvastatin (VYTORIN) 10-40 MG per tablet, Take 1 tablet by mouth at bedtime., Disp: , Rfl: ;  levothyroxine (SYNTHROID, LEVOTHROID) 75 MCG tablet, Take 75 mcg by mouth daily.  , Disp: , Rfl:  medroxyPROGESTERone (PROVERA) 2.5 MG tablet, Take 5 mg by mouth daily. For 10 days each month, Disp: , Rfl: ;  olmesartan (BENICAR) 40 MG tablet, Take 40 mg by mouth daily., Disp: , Rfl: ;  [DISCONTINUED] losartan-hydrochlorothiazide (HYZAAR) 100-12.5 MG per tablet, Take 1 tablet by mouth daily., Disp: 30 tablet, Rfl: 11  BP 181/91  Pulse 72  Resp 18  Ht 5' (1.524 m)  Wt 151 lb 12.8 oz (68.856 kg)  BMI 29.65 kg/m2  Body mass index is 29.65 kg/(m^2).       Physical exam a well-developed well-nourished female appearing younger than stated age She is grossly intact neurologically She does have palpable dorsalis pedis pulses bilaterally He does have marked telangiectasia or both lower extremities but no large varicosities and no significant swelling No venous ulceration  Left leg venous duplex today was reviewed with the patient. This does  show evidence of reflux throughout her deep system on the left to include her common femoral vein, femoral vein, popliteal vein. Her saphenous vein is successfully ablated.  I did image her right leg with SonoSite ultrasound myself in this does show ablation of her right great saphenous vein as well.  Impression and plan a long discussion with the patient. I suspect that this is at least in part related to venous hypertension. I did explain that she does not have any evidence of DVT or other dangerous situation. I did explain that the venous hypertension is treatable only with elevation and compression. She was reassured that this discussion will see [name] again on  an as-needed basis

## 2014-02-26 ENCOUNTER — Encounter: Payer: Self-pay | Admitting: Cardiovascular Disease

## 2014-02-26 ENCOUNTER — Ambulatory Visit (HOSPITAL_COMMUNITY): Payer: Medicare Other | Admitting: Radiology

## 2014-02-26 NOTE — Progress Notes (Signed)
Pt cancelled appointment due to claustrophobia. Crissie Figures RN

## 2014-02-26 NOTE — Progress Notes (Deleted)
[facility] [address] [place], [place] [address] [phone]    Cardiology Nuclear Med Study  [name] is a 80 y.o. female     MRN : [id number]     DOB: [birth year]  Procedure Date: 02/26/2014  Nuclear Med Background Indication for Stress Test:  {CHL INDICATION STRESS TEST:21021011} History:  {CHL HISTORY STRESS TEST:22964} Cardiac Risk Factors: {CHL CARDIAC RISK FACTORS STRESS TEST:21021014}  Symptoms:  {CHL SYMPTOMS STRESS TEST:21021013}   Nuclear Pre-Procedure Caffeine/Decaff Intake:  {CHL CAFFEINE/DECAFF INTAKE NUC:21021108} NPO After: {CHL AMB NPO TIMES:21021015}   Lungs:  {Exam; lungs brief:12271} O2 Sat: ***% on {Exam; oxygen delivery:30093}. IV 0.9% NS with Angio Cath:  {CHL IV 0.9% WITH ANGIO CATH NUC:21021042}  IV Site: {CHL IV SITE STRESS TEST:21021077}  IV Started by:  {CHL LB STRESS TEST IV STARTED AC:16606301}  Chest Size (in):  *** Cup Size: {NA AND SWFUXNAT:55732}  Height: 5' (1.524 m)  Weight:  152 lb (68.947 kg)  BMI:  Body mass index is 29.69 kg/(m^2). Tech Comments:  ***    Nuclear Med Study 1 or 2 day study: {CHL 1 OR 2 DAY STUDY:21021019}  Stress Test Type:  {CHL STRESS TEST TYPE:21021018}  Reading MD: {CHL LB NUC READING KG:25427062}  Order Authorizing Provider:  ***  Resting Radionuclide: {CHL RESTING RADIONUCLIDE:21021021}  Resting Radionuclide Dose: *** mCi   Stress Radionuclide:  {CHL STRESS RADIONUCLIDE:21021022}  Stress Radionuclide Dose: *** mCi           Stress Protocol Rest HR: *** Stress HR: ***  Rest BP: *** Stress BP: ***  Exercise Time (min): {NA AND WILDCARD:21589} METS: {NA AND WILDCARD:21589}           Dose of Adenosine (mg):  {NA AND BJSEGBTD:17616} Dose of Lexiscan: {CHL CARD WILDCARD AND 0.4:21590} mg  Dose of Atropine (mg): {NA AND WVPXTGGY:69485} Dose of Dobutamine: {NA AND WILDCARD:21589} mcg/kg/min (at max HR)  Stress Test Technologist: {CHL LB STRESS TEST TECHNOLOGIST:21021024}   Nuclear Technologist:  {CHL LB NUCLEAR TECHNOLOGIST:21021025}     Rest Procedure:  {CHL REST PROCEDURE NUCLEAR:21021027} Rest ECG: {CHL REST IOE:70350}  Stress Procedure:  {CHL STRESS PROCEDURE NUCLEAR:21021028} Stress ECG: {CHL CAR STRESS ECG:21561}  QPS Raw Data Images:  {CHL RAW DATA IMAGES NUC:21021029} Stress Images:  {CHL STRESS IMAGES NUC:21021030} Rest Images:  {CHL REST IMAGES NUC:21021031} Subtraction (SDS):  {CHL SUBTRACTION (SDS) NUC:21021032} Transient Ischemic Dilatation (Normal <1.22):  *** Lung/Heart Ratio (Normal <0.45):  ***  Quantitative Gated Spect Images QGS EDV:  *** ml QGS ESV:  *** ml  Impression Exercise Capacity:  {CHL EXERCISE CAPACITY NUC:21021037} BP Response:  {CHL BP RESPONSE NUC:21021038} Clinical Symptoms:  {CHL CLINICAL SYMPTOMS NUC:21021039} ECG Impression:  {CHL ECG IMPRESSION NUC:21021040} Comparison with Prior Nuclear Study: {CHL NUCLEAR STUDY COMPARISON:21562}  Overall Impression:  {CHL OVERALL IMPRESSION NUC:21021041}  LV Ejection Fraction: {CHL CARD STUDY NOT GATED:21592:o}.  LV Wall Motion:  {CHL CARD QGS:21591:o}   Signed by Kirstie Peri on 02/26/2014 at 9:12 AM.

## 2014-03-02 DIAGNOSIS — I10 Essential (primary) hypertension: Secondary | ICD-10-CM | POA: Diagnosis not present

## 2014-03-02 DIAGNOSIS — M1710 Unilateral primary osteoarthritis, unspecified knee: Secondary | ICD-10-CM | POA: Diagnosis not present

## 2014-03-02 DIAGNOSIS — R55 Syncope and collapse: Secondary | ICD-10-CM | POA: Diagnosis not present

## 2014-03-02 DIAGNOSIS — E039 Hypothyroidism, unspecified: Secondary | ICD-10-CM | POA: Diagnosis not present

## 2014-03-05 DIAGNOSIS — M25569 Pain in unspecified knee: Secondary | ICD-10-CM | POA: Diagnosis not present

## 2014-03-17 ENCOUNTER — Telehealth: Payer: Self-pay | Admitting: Nurse Practitioner

## 2014-03-17 NOTE — Telephone Encounter (Signed)
Spoke with patient and gave her monitor results.  Patient verbalized understanding and agreement and states she believes the syncope occurred due to being overmedicated for a respiratory infection.  Patient thanked me for the call.

## 2014-03-19 ENCOUNTER — Encounter: Payer: Self-pay | Admitting: Cardiovascular Disease

## 2014-03-30 DIAGNOSIS — S81809A Unspecified open wound, unspecified lower leg, initial encounter: Secondary | ICD-10-CM | POA: Diagnosis not present

## 2014-03-30 DIAGNOSIS — L03119 Cellulitis of unspecified part of limb: Secondary | ICD-10-CM | POA: Diagnosis not present

## 2014-03-30 DIAGNOSIS — L02419 Cutaneous abscess of limb, unspecified: Secondary | ICD-10-CM | POA: Diagnosis not present

## 2014-04-01 ENCOUNTER — Emergency Department (HOSPITAL_COMMUNITY): Payer: Medicare Other

## 2014-04-01 ENCOUNTER — Ambulatory Visit: Payer: Medicare Other | Admitting: Cardiovascular Disease

## 2014-04-01 ENCOUNTER — Inpatient Hospital Stay (HOSPITAL_COMMUNITY)
Admission: EM | Admit: 2014-04-01 | Discharge: 2014-04-06 | DRG: 556 | Disposition: A | Discharge status: Home Health | Payer: Medicare Other | Attending: Internal Medicine | Admitting: Internal Medicine

## 2014-04-01 ENCOUNTER — Encounter (HOSPITAL_COMMUNITY): Payer: Self-pay | Admitting: Emergency Medicine

## 2014-04-01 DIAGNOSIS — L0291 Cutaneous abscess, unspecified: Secondary | ICD-10-CM

## 2014-04-01 DIAGNOSIS — Z87891 Personal history of nicotine dependence: Secondary | ICD-10-CM | POA: Diagnosis not present

## 2014-04-01 DIAGNOSIS — N183 Chronic kidney disease, stage 3 unspecified: Secondary | ICD-10-CM | POA: Diagnosis present

## 2014-04-01 DIAGNOSIS — R600 Localized edema: Secondary | ICD-10-CM

## 2014-04-01 DIAGNOSIS — I129 Hypertensive chronic kidney disease with stage 1 through stage 4 chronic kidney disease, or unspecified chronic kidney disease: Secondary | ICD-10-CM | POA: Diagnosis present

## 2014-04-01 DIAGNOSIS — L02419 Cutaneous abscess of limb, unspecified: Secondary | ICD-10-CM | POA: Diagnosis not present

## 2014-04-01 DIAGNOSIS — R000 Tachycardia, unspecified: Secondary | ICD-10-CM | POA: Diagnosis present

## 2014-04-01 DIAGNOSIS — D649 Anemia, unspecified: Secondary | ICD-10-CM | POA: Diagnosis present

## 2014-04-01 DIAGNOSIS — L03119 Cellulitis of unspecified part of limb: Secondary | ICD-10-CM

## 2014-04-01 DIAGNOSIS — Z79899 Other long term (current) drug therapy: Secondary | ICD-10-CM

## 2014-04-01 DIAGNOSIS — Z791 Long term (current) use of non-steroidal anti-inflammatories (NSAID): Secondary | ICD-10-CM

## 2014-04-01 DIAGNOSIS — I83893 Varicose veins of bilateral lower extremities with other complications: Secondary | ICD-10-CM

## 2014-04-01 DIAGNOSIS — Z8249 Family history of ischemic heart disease and other diseases of the circulatory system: Secondary | ICD-10-CM

## 2014-04-01 DIAGNOSIS — I491 Atrial premature depolarization: Secondary | ICD-10-CM

## 2014-04-01 DIAGNOSIS — Z880 Allergy status to penicillin: Secondary | ICD-10-CM

## 2014-04-01 DIAGNOSIS — R55 Syncope and collapse: Secondary | ICD-10-CM

## 2014-04-01 DIAGNOSIS — R609 Edema, unspecified: Secondary | ICD-10-CM

## 2014-04-01 DIAGNOSIS — Z888 Allergy status to other drugs, medicaments and biological substances status: Secondary | ICD-10-CM | POA: Diagnosis not present

## 2014-04-01 DIAGNOSIS — I10 Essential (primary) hypertension: Secondary | ICD-10-CM | POA: Diagnosis not present

## 2014-04-01 DIAGNOSIS — E039 Hypothyroidism, unspecified: Secondary | ICD-10-CM

## 2014-04-01 DIAGNOSIS — Z885 Allergy status to narcotic agent status: Secondary | ICD-10-CM | POA: Diagnosis not present

## 2014-04-01 DIAGNOSIS — E785 Hyperlipidemia, unspecified: Secondary | ICD-10-CM | POA: Diagnosis not present

## 2014-04-01 DIAGNOSIS — I4719 Other supraventricular tachycardia: Secondary | ICD-10-CM

## 2014-04-01 DIAGNOSIS — M79609 Pain in unspecified limb: Principal | ICD-10-CM

## 2014-04-01 DIAGNOSIS — I471 Supraventricular tachycardia: Secondary | ICD-10-CM

## 2014-04-01 DIAGNOSIS — Z886 Allergy status to analgesic agent status: Secondary | ICD-10-CM

## 2014-04-01 DIAGNOSIS — I359 Nonrheumatic aortic valve disorder, unspecified: Secondary | ICD-10-CM | POA: Diagnosis present

## 2014-04-01 DIAGNOSIS — S81809A Unspecified open wound, unspecified lower leg, initial encounter: Secondary | ICD-10-CM | POA: Diagnosis not present

## 2014-04-01 DIAGNOSIS — I350 Nonrheumatic aortic (valve) stenosis: Secondary | ICD-10-CM

## 2014-04-01 DIAGNOSIS — L0390 Cellulitis, unspecified: Secondary | ICD-10-CM

## 2014-04-01 DIAGNOSIS — L03116 Cellulitis of left lower limb: Secondary | ICD-10-CM

## 2014-04-01 LAB — CBC WITH DIFFERENTIAL/PLATELET
Basophils Absolute: 0 10*3/uL (ref 0.0–0.1)
Basophils Relative: 0 % (ref 0–1)
Eosinophils Absolute: 0.1 10*3/uL (ref 0.0–0.7)
Eosinophils Relative: 1 % (ref 0–5)
HCT: 38.7 % (ref 36.0–46.0)
Hemoglobin: 12.7 g/dL (ref 12.0–15.0)
Lymphocytes Relative: 17 % (ref 12–46)
Lymphs Abs: 1.9 10*3/uL (ref 0.7–4.0)
MCH: 30.3 pg (ref 26.0–34.0)
MCHC: 32.8 g/dL (ref 30.0–36.0)
MCV: 92.4 fL (ref 78.0–100.0)
Monocytes Absolute: 0.8 10*3/uL (ref 0.1–1.0)
Monocytes Relative: 7 % (ref 3–12)
Neutro Abs: 8.5 10*3/uL — ABNORMAL HIGH (ref 1.7–7.7)
Neutrophils Relative %: 75 % (ref 43–77)
Platelets: 383 10*3/uL (ref 150–400)
RBC: 4.19 MIL/uL (ref 3.87–5.11)
RDW: 13.5 % (ref 11.5–15.5)
WBC: 11.3 10*3/uL — ABNORMAL HIGH (ref 4.0–10.5)

## 2014-04-01 LAB — BASIC METABOLIC PANEL
BUN: 18 mg/dL (ref 6–23)
CO2: 25 mEq/L (ref 19–32)
Calcium: 9.5 mg/dL (ref 8.4–10.5)
Chloride: 99 mEq/L (ref 96–112)
Creatinine, Ser: 1.09 mg/dL (ref 0.50–1.10)
GFR calc Af Amer: 52 mL/min — ABNORMAL LOW (ref >90)
GFR calc non Af Amer: 45 mL/min — ABNORMAL LOW (ref >90)
Glucose, Bld: 108 mg/dL — ABNORMAL HIGH (ref 70–99)
Potassium: 4.7 mEq/L (ref 3.7–5.3)
Sodium: 138 mEq/L (ref 137–147)

## 2014-04-01 LAB — I-STAT CG4 LACTIC ACID, ED: Lactic Acid, Venous: 1.18 mmol/L (ref 0.5–2.2)

## 2014-04-01 MED ORDER — CLINDAMYCIN PHOSPHATE 600 MG/50ML IV SOLN
600.0000 mg | Freq: Once | INTRAVENOUS | Status: AC
  Administered 2014-04-01: 600 mg via INTRAVENOUS
  Filled 2014-04-01: qty 50

## 2014-04-01 MED ORDER — ONDANSETRON HCL 4 MG/2ML IJ SOLN: 4.0000 mg | Freq: Four times a day (QID) | INTRAMUSCULAR | Status: DC | PRN

## 2014-04-01 MED ORDER — ACETAMINOPHEN 650 MG RE SUPP: 650.0000 mg | Freq: Four times a day (QID) | RECTAL | Status: DC | PRN

## 2014-04-01 MED ORDER — ENOXAPARIN SODIUM 40 MG/0.4ML ~~LOC~~ SOLN
40.0000 mg | SUBCUTANEOUS | Status: DC
  Administered 2014-04-01 – 2014-04-05 (×5): 40 mg via SUBCUTANEOUS
  Filled 2014-04-01 (×6): qty 0.4

## 2014-04-01 MED ORDER — ACETAMINOPHEN 325 MG PO TABS
650.0000 mg | ORAL_TABLET | Freq: Once | ORAL | Status: AC
  Administered 2014-04-01: 650 mg via ORAL
  Filled 2014-04-01: qty 2

## 2014-04-01 MED ORDER — ACETAMINOPHEN 325 MG PO TABS
650.0000 mg | ORAL_TABLET | Freq: Four times a day (QID) | ORAL | Status: DC | PRN
  Administered 2014-04-01 – 2014-04-03 (×5): 650 mg via ORAL
  Filled 2014-04-01 (×5): qty 2

## 2014-04-01 MED ORDER — CLINDAMYCIN PHOSPHATE 600 MG/50ML IV SOLN
600.0000 mg | Freq: Three times a day (TID) | INTRAVENOUS | Status: DC
  Administered 2014-04-01 – 2014-04-06 (×14): 600 mg via INTRAVENOUS
  Filled 2014-04-01 (×15): qty 50

## 2014-04-01 MED ORDER — ONDANSETRON HCL 4 MG PO TABS: 4.0000 mg | ORAL_TABLET | Freq: Four times a day (QID) | ORAL | Status: DC | PRN

## 2014-04-01 MED ORDER — DOCUSATE SODIUM 100 MG PO CAPS
100.0000 mg | ORAL_CAPSULE | Freq: Two times a day (BID) | ORAL | Status: DC
  Administered 2014-04-01 – 2014-04-03 (×3): 100 mg via ORAL
  Filled 2014-04-01 (×11): qty 1

## 2014-04-01 MED ORDER — OXYCODONE HCL 5 MG PO TABS: 5.0000 mg | ORAL_TABLET | ORAL | Status: DC | PRN

## 2014-04-01 NOTE — ED Notes (Signed)
Second attempt to give report unsuccessful. When asked to give report to charge RN secretary responded it is charge RN that is unavailable (on lunch break).

## 2014-04-01 NOTE — ED Notes (Signed)
Care handoff charged on wrong pt.

## 2014-04-01 NOTE — ED Provider Notes (Signed)
CSN: [id number]     Arrival date & time 04/01/14  1137 History   First MD Initiated Contact with Patient 04/01/14 1246     Chief Complaint  Patient presents with  . Cellulitis     (Consider location/radiation/quality/duration/timing/severity/associated sxs/prior Treatment) HPI 80 year old female presents with worsening cellulitis to her left leg. She states started about a week and a half ago. It originally seemed to come from a scratch from her dog. The dog did not bite her but scratch as the dog was falling. Patient states she's been having worsening redness and and now has a large wound, much larger than the original. She's describing as a mild to moderate 8/10 pain. She would normally take Tylenol for this type of pain. She had subjective fever yesterday but has not noticed a fever today.  Past Medical History  Diagnosis Date  . HTN (hypertension)   . HLD (hyperlipidemia)   . Hypothyroidism   . Aortic stenosis, moderate     last echo in 2010; AVA 1.1; mild AS per echo in June 2013  . Edema of both legs     s/p laser treatment per Dr. Donnetta Hutching   Past Surgical History  Procedure Laterality Date  . Appendectomy  1955  . Foot surgery  2003    bilateral Hammer Toe  . Eye surgery  2005    Bilateral cataract  . Endovenous ablation saphenous vein w/ laser  08-29-2012    left greater saphenous vein   Curt Jews MD  . Endovenous ablation saphenous vein w/ laser  09-19-2012    right greater saphenous vein by Curt Jews MD  . Stab phlebectomy Right 01-02-2013    10-15 incisions right thigh and calf by Curt Jews MD   Family History  Problem Relation Age of Onset  . Coronary artery disease    . Peripheral vascular disease    . Heart attack    . Heart disease Mother   . Peripheral vascular disease Mother   . Heart disease Father   . Peripheral vascular disease Father     Right leg amputation  . Hypertension Sister   . Heart disease Brother 74    Heart Disease before age 27    History  Substance Use Topics  . Smoking status: Former Smoker    Types: Cigarettes    Quit date: 10/31/1983  . Smokeless tobacco: Never Used  . Alcohol Use: No   OB History   Grav Para Term Preterm Abortions TAB SAB Ect Mult Living                 Review of Systems  Constitutional: Positive for fever.  Cardiovascular: Positive for leg swelling.  Skin: Positive for color change and wound.  Neurological: Negative for weakness and numbness.  All other systems reviewed and are negative.     Allergies  Aspirin; Codeine; Coreg; and Penicillins  Home Medications   Prior to Admission medications   Medication Sig Start Date End Date Taking? Authorizing Provider  acetaminophen (TYLENOL) 500 MG tablet Take 500 mg by mouth every 6 (six) hours as needed (pain).   Yes Historical Provider, MD  amoxicillin-clavulanate (AUGMENTIN) 875-125 MG per tablet Take 1 tablet by mouth 2 (two) times daily. Takes for 10 days 03/30/14  Yes Historical Provider, MD  calcium carbonate (OS-CAL) 600 MG TABS Take 1,800 mg by mouth daily.   Yes Historical Provider, MD  diclofenac sodium (VOLTAREN) 1 % GEL Apply 2 g topically 4 (four) times daily as  needed (For pain).   Yes Historical Provider, MD  dicyclomine (BENTYL) 20 MG tablet Take 20 mg by mouth every 6 (six) hours as needed. For stomach irritability.   Yes Historical Provider, MD  estrogens, conjugated, (PREMARIN) 0.625 MG tablet Take 0.625 mg by mouth daily. Take daily for 21 days then do not take for 7 days.   Yes Historical Provider, MD  ezetimibe-simvastatin (VYTORIN) 10-40 MG per tablet Take 1 tablet by mouth at bedtime.   Yes Historical Provider, MD  furosemide (LASIX) 20 MG tablet Take 20 mg by mouth daily.   Yes Historical Provider, MD  levothyroxine (SYNTHROID, LEVOTHROID) 75 MCG tablet Take 75 mcg by mouth daily.     Yes Historical Provider, MD  medroxyPROGESTERone (PROVERA) 2.5 MG tablet Take 5 mg by mouth daily. For 10 days each month   Yes  Historical Provider, MD  olmesartan (BENICAR) 40 MG tablet Take 40 mg by mouth daily.   Yes Historical Provider, MD  olopatadine (PATANOL) 0.1 % ophthalmic solution Place 1 drop into both eyes 2 (two) times daily.   Yes Historical Provider, MD  Omega-3 Fatty Acids (FISH OIL) 1000 MG CAPS Take 1 capsule by mouth 3 (three) times daily.   Yes Historical Provider, MD   BP 118/75  Pulse 84  Temp(Src) 97.1 F (36.2 C) (Oral)  Resp 18  SpO2 100% Physical Exam  Nursing note and vitals reviewed. Constitutional: She is oriented to person, place, and time. She appears well-developed and well-nourished.  HENT:  Head: Normocephalic and atraumatic.  Right Ear: External ear normal.  Left Ear: External ear normal.  Nose: Nose normal.  Eyes: Right eye exhibits no discharge. Left eye exhibits no discharge.  Cardiovascular: Normal rate, regular rhythm and normal heart sounds.   Pulses:      Dorsalis pedis pulses are 2+ on the right side, and 2+ on the left side.  Pulmonary/Chest: Effort normal and breath sounds normal.  Abdominal: Soft. She exhibits no distension. There is no tenderness.  Musculoskeletal:       Left lower leg: She exhibits swelling. She exhibits no tenderness and no bony tenderness.  Neurological: She is alert and oriented to person, place, and time.  Skin: Skin is warm and dry. There is erythema.      ED Course  Procedures (including critical care time) Labs Review Labs Reviewed  CBC WITH DIFFERENTIAL - Abnormal; Notable for the following:    WBC 11.3 (*)    Neutro Abs 8.5 (*)    All other components within normal limits  BASIC METABOLIC PANEL - Abnormal; Notable for the following:    Glucose, Bld 108 (*)    GFR calc non Af Amer 45 (*)    GFR calc Af Amer 52 (*)    All other components within normal limits  CULTURE, BLOOD (ROUTINE X 2)  CULTURE, BLOOD (ROUTINE X 2)  I-STAT CG4 LACTIC ACID, ED    Imaging Review Dg Tibia/fibula Left  04/01/2014   CLINICAL DATA:   Cellulitis  EXAM: LEFT TIBIA AND FIBULA - 2 VIEW  COMPARISON:  None.  FINDINGS: There is no evidence of fracture or other focal bone lesions. Soft tissues are unremarkable. There is peripheral vascular atherosclerotic disease.  IMPRESSION: No acute osseous abnormality of the left tibia or fibula.   Electronically Signed   By: Kathreen Devoid   On: 04/01/2014 14:35     EKG Interpretation None      MDM   Final diagnoses:  Left leg cellulitis  Patient with worsening left leg cellulitis despite 2 days of augmentin. Afebrile here but is describing fevers at home and bloody drainage. No abscess on exam. Here she is stable, has mild WBC elevation. Her PCP, Dr. Ernie Hew, sent her here for admission. Will give IV fluids, IV clinda and admit to hospitalist. Her exam shows minimal tenderness, I have low suspicion of more invasive disease such as necrotizing fascitis.    Ephraim Hamburger, MD 04/01/14 [phone]

## 2014-04-01 NOTE — ED Notes (Signed)
Pt has swelling, warmth, and redness on lt foot/leg secondary to a dog scratch she got about 2 wks ago.  Swelling and redness is spreading up her leg.  Has been marked with a skin marker.  Was marked on Monday but swelling has spread past demarcations.  Denies vomiting.

## 2014-04-01 NOTE — H&P (Signed)
[facility] History and Physical  [name] ZOX:[id number] DOB: Sep 24, 1927 DOA: 04/01/2014  Referring physician:  PCP: Sydney Cipro, MD  Specialists:   Chief Complaint: Left lower leg pain  HPI: [name] is a 80 y.o. WF PMHx HTN, HLD, hypothyroidism, moderate aortic stenosis presents with worsening cellulitis to her left leg refractory to by mouth treatment (Augmentin). She states started about a week and a half ago. It originally seemed to come from a scratch from her dog. The dog did not bite her but scratch as the dog was falling. Patient states she's been having worsening redness and and now has a large wound, much larger than the original. She's describing as a mild to moderate 8/10 pain. She would normally take Tylenol for this type of pain. She had subjective fever yesterday but has not noticed a fever today. Patient states was using hydrogen peroxide to cleanse wound daily and will continue to worsen. Saw her PCP approximately 3 days ago was given a shot (unknown antibiotic) and Augmentin. States after taking 2 days of Augmentin the left lower leg continued to worsen and was instructed to proceed to ED for evaluation/admit.      Review of Systems: The patient denies anorexia, fever, weight loss,, vision loss, decreased hearing, hoarseness, chest pain, syncope, dyspnea on exertion, peripheral edema, balance deficits, hemoptysis, abdominal pain, melena, hematochezia, severe indigestion/heartburn, hematuria, incontinence, genital sores, muscle weakness, transient blindness,  depression, unusual weight change, abnormal bleeding, enlarged lymph nodes, angioedema, and breast masses.    TRAVEL HISTORY: N/A    Consultants:  NA  Procedure/Significant Events:  6/3 DG fibula/fibula left; No acute osseous abnormality of the left tibia or fibula     Culture  6/3 blood pending   Antibiotics:  Clindamycin 6/3>>   DVT prophylaxis:  Lovenox  Devices   NA   LINES / TUBES:  6/3 20ga right antecubital   Past Medical History  Diagnosis Date  . HTN (hypertension)   . HLD (hyperlipidemia)   . Hypothyroidism   . Aortic stenosis, moderate     last echo in 2010; AVA 1.1; mild AS per echo in June 2013  . Edema of both legs     s/p laser treatment per Dr. Donnetta Hutching   Past Surgical History  Procedure Laterality Date  . Appendectomy  1955  . Foot surgery  2003    bilateral Hammer Toe  . Eye surgery  2005    Bilateral cataract  . Endovenous ablation saphenous vein w/ laser  08-29-2012    left greater saphenous vein   Curt Jews MD  . Endovenous ablation saphenous vein w/ laser  09-19-2012    right greater saphenous vein by Curt Jews MD  . Stab phlebectomy Right 01-02-2013    10-15 incisions right thigh and calf by Curt Jews MD   Social History:  reports that she quit smoking about 30 years ago. Her smoking use included Cigarettes. She smoked 0.00 packs per day. She has never used smokeless tobacco. She reports that she does not drink alcohol or use illicit drugs.   Allergies  Allergen Reactions  . Aspirin Nausea And Vomiting  . Codeine Nausea And Vomiting    "Deathly Sick"  . Coreg [Carvedilol] Other (See Comments)    Fatigue/ ill  . Penicillins Rash    Family History  Problem Relation Age of Onset  . Coronary artery disease    . Peripheral vascular disease    . Heart attack    . Heart disease  Mother   . Peripheral vascular disease Mother   . Heart disease Father   . Peripheral vascular disease Father     Right leg amputation  . Hypertension Sister   . Heart disease Brother 58    Heart Disease before age 93    Prior to Admission medications   Medication Sig Start Date End Date Taking? Authorizing Provider  acetaminophen (TYLENOL) 500 MG tablet Take 500 mg by mouth every 6 (six) hours as needed (pain).   Yes Historical Provider, MD  amoxicillin-clavulanate (AUGMENTIN) 875-125 MG per tablet Take 1 tablet by mouth 2  (two) times daily. Takes for 10 days 03/30/14  Yes Historical Provider, MD  calcium carbonate (OS-CAL) 600 MG TABS Take 1,800 mg by mouth daily.   Yes Historical Provider, MD  diclofenac sodium (VOLTAREN) 1 % GEL Apply 2 g topically 4 (four) times daily as needed (For pain).   Yes Historical Provider, MD  dicyclomine (BENTYL) 20 MG tablet Take 20 mg by mouth every 6 (six) hours as needed. For stomach irritability.   Yes Historical Provider, MD  estrogens, conjugated, (PREMARIN) 0.625 MG tablet Take 0.625 mg by mouth daily. Take daily for 21 days then do not take for 7 days.   Yes Historical Provider, MD  ezetimibe-simvastatin (VYTORIN) 10-40 MG per tablet Take 1 tablet by mouth at bedtime.   Yes Historical Provider, MD  furosemide (LASIX) 20 MG tablet Take 20 mg by mouth daily.   Yes Historical Provider, MD  levothyroxine (SYNTHROID, LEVOTHROID) 75 MCG tablet Take 75 mcg by mouth daily.     Yes Historical Provider, MD  medroxyPROGESTERone (PROVERA) 2.5 MG tablet Take 5 mg by mouth daily. For 10 days each month   Yes Historical Provider, MD  olmesartan (BENICAR) 40 MG tablet Take 40 mg by mouth daily.   Yes Historical Provider, MD  olopatadine (PATANOL) 0.1 % ophthalmic solution Place 1 drop into both eyes 2 (two) times daily.   Yes Historical Provider, MD  Omega-3 Fatty Acids (FISH OIL) 1000 MG CAPS Take 1 capsule by mouth 3 (three) times daily.   Yes Historical Provider, MD   Physical Exam: Filed Vitals:   04/01/14 1559 04/01/14 1723 04/01/14 2237 04/02/14 0650  BP: 123/69 151/97 91/65 107/74  Pulse: 91 124 112 108  Temp: 98.5 F (36.9 C) 97.5 F (36.4 C) 98.6 F (37 C) 98.2 F (36.8 C)  TempSrc: Oral Oral Oral Oral  Resp: 20 20 18 20   Height: 5' (1.524 m)     Weight: 70.534 kg (155 lb 8 oz)     SpO2: 100% 94% 97% 98%     General:  A./O. x4, NAD,  Eyes: He was equal react to light and accommodation  Neck: Negative JVD, negative lymphadenopathy  Cardiovascular: Regular rhythm and  rate, negative murmurs rubs gallops, DP/PT pulse 2+ right lower extremity, 1+ left lower extremity  Respiratory: Clear to auscultation bilateral  Abdomen: Soft, nontender, nondistended, plus bowel  Skin: Patient with left lower leg erythema, positive warmth, positive large round ulcerative lesion middle of the right lower extremity medial aspect. Outlined area dated 6/3 tender to palpation  Musculoskeletal: Negative pedal edema, positive left lower Street swelling  Neurologic: Cranial nerves II through XII intact, extremity strength 5/5, sensation intact did not ambulate patient  Labs on Admission:  Basic Metabolic Panel:  Recent Labs Lab 04/01/14 1335 04/02/14 0458  NA 138 141  K 4.7 4.5  CL 99 104  CO2 25 28  GLUCOSE 108* 109*  BUN 18 20  CREATININE 1.09 1.19*  CALCIUM 9.5 8.9  MG  --  1.9   Liver Function Tests:  Recent Labs Lab 04/02/14 0458  AST 16  ALT 7  ALKPHOS 52  BILITOT 0.3  PROT 6.0  ALBUMIN 2.5*   No results found for this basename: LIPASE, AMYLASE,  in the last 168 hours No results found for this basename: AMMONIA,  in the last 168 hours CBC:  Recent Labs Lab 04/01/14 1335 04/02/14 0458  WBC 11.3* 8.3  NEUTROABS 8.5* 4.9  HGB 12.7 10.8*  HCT 38.7 33.9*  MCV 92.4 93.9  PLT 383 337   Cardiac Enzymes: No results found for this basename: CKTOTAL, CKMB, CKMBINDEX, TROPONINI,  in the last 168 hours  BNP (last 3 results) No results found for this basename: PROBNP,  in the last 8760 hours CBG: No results found for this basename: GLUCAP,  in the last 168 hours  Radiological Exams on Admission: Dg Tibia/fibula Left  04/01/2014   CLINICAL DATA:  Cellulitis  EXAM: LEFT TIBIA AND FIBULA - 2 VIEW  COMPARISON:  None.  FINDINGS: There is no evidence of fracture or other focal bone lesions. Soft tissues are unremarkable. There is peripheral vascular atherosclerotic disease.  IMPRESSION: No acute osseous abnormality of the left tibia or fibula.    Electronically Signed   By: Kathreen Devoid   On: 04/01/2014 14:35    EKG: N/A   Assessment/Plan Active Problems:   HTN (hypertension)   Atrial tachycardia, paroxysmal   Left leg cellulitis   Cellulitis   HLD (hyperlipidemia)   Unspecified hypothyroidism   Left leg cellulitis -Refractory to PO medication we'll admit for IV clindamycin -Consider wound care consult in a.m.  HTN -Continue Benicar 40 mg daily; note Ibesartan 300 mg daily will be substituted -Continue Lasix 20 mg daily   Proximal to tachycardia -Currently in NSR  Hypothyroidism -Continue Synthroid 75 mcg daily  HLD -Continue Vytorin 10-40 mg daily  Code Status: Full (must indicate code status--if unknown or must be presumed, indicate so) Family Communication: None Disposition Plan: Resolution of cellulitis   Time spent: 45 minutes  [facility] Pager [phone]  If 7PM-7AM, please contact night-coverage [email or web address] Password [facility] 04/02/2014, 7:34 AM

## 2014-04-01 NOTE — ED Notes (Signed)
Attempted to call report to floor, RN not available.

## 2014-04-02 DIAGNOSIS — L02419 Cutaneous abscess of limb, unspecified: Secondary | ICD-10-CM | POA: Diagnosis not present

## 2014-04-02 DIAGNOSIS — I10 Essential (primary) hypertension: Secondary | ICD-10-CM | POA: Diagnosis not present

## 2014-04-02 DIAGNOSIS — E785 Hyperlipidemia, unspecified: Secondary | ICD-10-CM | POA: Diagnosis not present

## 2014-04-02 DIAGNOSIS — R609 Edema, unspecified: Secondary | ICD-10-CM | POA: Diagnosis not present

## 2014-04-02 DIAGNOSIS — I83893 Varicose veins of bilateral lower extremities with other complications: Secondary | ICD-10-CM

## 2014-04-02 DIAGNOSIS — E039 Hypothyroidism, unspecified: Secondary | ICD-10-CM | POA: Diagnosis present

## 2014-04-02 LAB — CBC WITH DIFFERENTIAL/PLATELET
Basophils Absolute: 0 10*3/uL (ref 0.0–0.1)
Basophils Relative: 0 % (ref 0–1)
Eosinophils Absolute: 0.3 10*3/uL (ref 0.0–0.7)
Eosinophils Relative: 4 % (ref 0–5)
HCT: 33.9 % — ABNORMAL LOW (ref 36.0–46.0)
Hemoglobin: 10.8 g/dL — ABNORMAL LOW (ref 12.0–15.0)
Lymphocytes Relative: 29 % (ref 12–46)
Lymphs Abs: 2.4 10*3/uL (ref 0.7–4.0)
MCH: 29.9 pg (ref 26.0–34.0)
MCHC: 31.9 g/dL (ref 30.0–36.0)
MCV: 93.9 fL (ref 78.0–100.0)
Monocytes Absolute: 0.7 10*3/uL (ref 0.1–1.0)
Monocytes Relative: 9 % (ref 3–12)
Neutro Abs: 4.9 10*3/uL (ref 1.7–7.7)
Neutrophils Relative %: 58 % (ref 43–77)
Platelets: 337 10*3/uL (ref 150–400)
RBC: 3.61 MIL/uL — ABNORMAL LOW (ref 3.87–5.11)
RDW: 13.6 % (ref 11.5–15.5)
WBC: 8.3 10*3/uL (ref 4.0–10.5)

## 2014-04-02 LAB — RETICULOCYTES
RBC.: 3.75 MIL/uL — ABNORMAL LOW (ref 3.87–5.11)
Retic Count, Absolute: 67.5 10*3/uL (ref 19.0–186.0)
Retic Ct Pct: 1.8 % (ref 0.4–3.1)

## 2014-04-02 LAB — COMPREHENSIVE METABOLIC PANEL
ALT: 7 U/L (ref 0–35)
AST: 16 U/L (ref 0–37)
Albumin: 2.5 g/dL — ABNORMAL LOW (ref 3.5–5.2)
Alkaline Phosphatase: 52 U/L (ref 39–117)
BUN: 20 mg/dL (ref 6–23)
CO2: 28 mEq/L (ref 19–32)
Calcium: 8.9 mg/dL (ref 8.4–10.5)
Chloride: 104 mEq/L (ref 96–112)
Creatinine, Ser: 1.19 mg/dL — ABNORMAL HIGH (ref 0.50–1.10)
GFR calc Af Amer: 47 mL/min — ABNORMAL LOW (ref >90)
GFR calc non Af Amer: 41 mL/min — ABNORMAL LOW (ref >90)
Glucose, Bld: 109 mg/dL — ABNORMAL HIGH (ref 70–99)
Potassium: 4.5 mEq/L (ref 3.7–5.3)
Sodium: 141 mEq/L (ref 137–147)
Total Bilirubin: 0.3 mg/dL (ref 0.3–1.2)
Total Protein: 6 g/dL (ref 6.0–8.3)

## 2014-04-02 LAB — VITAMIN B12: Vitamin B-12: 471 pg/mL (ref 211–911)

## 2014-04-02 LAB — MAGNESIUM: Magnesium: 1.9 mg/dL (ref 1.5–2.5)

## 2014-04-02 LAB — FOLATE: Folate: >20 ng/mL

## 2014-04-02 LAB — FERRITIN: Ferritin: 72 ng/mL (ref 10–291)

## 2014-04-02 MED ORDER — FUROSEMIDE 20 MG PO TABS
20.0000 mg | ORAL_TABLET | Freq: Every day | ORAL | Status: DC
  Administered 2014-04-02 – 2014-04-06 (×5): 20 mg via ORAL
  Filled 2014-04-02 (×5): qty 1

## 2014-04-02 MED ORDER — LEVOTHYROXINE SODIUM 75 MCG PO TABS
75.0000 ug | ORAL_TABLET | Freq: Every day | ORAL | Status: DC
  Administered 2014-04-02 – 2014-04-06 (×5): 75 ug via ORAL
  Filled 2014-04-02 (×6): qty 1

## 2014-04-02 MED ORDER — IRBESARTAN 300 MG PO TABS
300.0000 mg | ORAL_TABLET | Freq: Every day | ORAL | Status: DC
  Administered 2014-04-02 – 2014-04-06 (×5): 300 mg via ORAL
  Filled 2014-04-02 (×6): qty 1

## 2014-04-02 MED ORDER — EZETIMIBE-SIMVASTATIN 10-40 MG PO TABS
1.0000 | ORAL_TABLET | Freq: Every day | ORAL | Status: DC
  Administered 2014-04-02 – 2014-04-05 (×4): 1 via ORAL
  Filled 2014-04-02 (×6): qty 1

## 2014-04-02 NOTE — Care Management Note (Unsigned)
    Page 1 of 1   04/06/2014     12:52:47 PM CARE MANAGEMENT NOTE 04/06/2014  Patient:  [name], [name]   Account Number:  [id number]  Date Initiated:  04/02/2014  Documentation initiated by:  [facility]  Subjective/Objective Assessment:   80 year old female admitted with cellulitis of LE.     Action/Plan:   From home.   Anticipated DC Date:  04/06/2014   Anticipated DC Plan:  [place]  CM consult      Choice offered to / List presented to:  C-1 Patient        [place] arranged  HH-1 RN      [place].   Status of service:  In process, will continue to follow Medicare Important Message given?   (If response is "NO", the following Medicare IM given date fields will be blank) Date Medicare IM given:   Date Additional Medicare IM given:    Discharge Disposition:    Per UR Regulation:  Reviewed for med. necessity/level of care/duration of stay  If discussed at [place] of Stay Meetings, dates discussed:    Comments:

## 2014-04-02 NOTE — Progress Notes (Signed)
[facility]                                                                              Patient Demographics  [name], is a 80 y.o. female, DOB - 04/24/1927, DEY:[id number]  Admit date - 04/01/2014   Admitting Physician Allie Bossier, MD  Outpatient Primary MD for the patient is [facility], MD  LOS - 1   Chief Complaint  Patient presents with  . Cellulitis      HPI: [name] is a 80 y.o. WF PMHx HTN, HLD, hypothyroidism, moderate aortic stenosis presents with worsening cellulitis to her left leg refractory to by mouth treatment (Augmentin). She states it started about a week and a half ago. It originally seemed to come from a scratch from her dog. The dog did not bite her but scratched her as the dog was falling. Patient states she has been having worsening redness and and now has a large wound, much larger than the original. She described as a mild to moderate 8/10 pain. She would normally take Tylenol for this type of pain. She had subjective fever yesterday but has not noticed a fever on day of admission. Patient states was using hydrogen peroxide to cleanse wound daily, but the wound continued to worsen. She saw her PCP approximately 3 days ago was given a shot (unknown antibiotic) and Augmentin. States after taking 2 days of Augmentin the left lower leg continued to worsen and was instructed to proceed to ED for evaluation/admit.  Assessment & Plan   Left lower extremity cellulitis -Patient received 2 doses of Augmentin as an outpatient, cellulitis began to worsen -Will consult wound care -Continue IV clindamycin and pain control -Patient currently afebrile with no leukocytosis  Hypertension -Continue ARB and Lasix  Tachycardia -Possibly secondary to pain -TSH was normal in March 2015 -Will continue to monitor  Hypothyroidism -Continue Synthroid -TSH 2.401 in March 2015  Hyperlipidemia -Continue Vytorin  Normocytic anemia -Patient has a  baseline hemoglobin of 12, currently 10.8 -Will obtain a FOBT as well as an anemia panel  Code Status: Full  Family Communication: None at bedside  Disposition Plan: Admitted  Time Spent in minutes   30 minutes  Procedures None  Consults  None  DVT Prophylaxis  Lovenox  Lab Results  Component Value Date   PLT 337 04/02/2014    Medications  Scheduled Meds: . clindamycin (CLEOCIN) IV  600 mg Intravenous 3 times per day  . docusate sodium  100 mg Oral BID  . enoxaparin (LOVENOX) injection  40 mg Subcutaneous Q24H  . ezetimibe-simvastatin  1 tablet Oral QHS  . furosemide  20 mg Oral Daily  . irbesartan  300 mg Oral Daily  . levothyroxine  75 mcg Oral QAC breakfast   Continuous Infusions:  PRN Meds:.acetaminophen, acetaminophen, ondansetron (ZOFRAN) IV, ondansetron, oxyCODONE  Antibiotics    Anti-infectives   Start     Dose/Rate Route Frequency Ordered Stop   04/01/14 1545  clindamycin (CLEOCIN) IVPB 600 mg     600 mg 100 mL/hr over 30 Minutes Intravenous 3 times per day 04/01/14 1536     04/01/14 1330  clindamycin (CLEOCIN) IVPB 600 mg  600 mg 100 mL/hr over 30 Minutes Intravenous  Once 04/01/14 1318 04/01/14 1414        Subjective:   [name] seen and examined today.  Patient states she is able to walk now, as she was not able to bear weight on her leg due to the pain.  She denies chest pain, shortness of breath, abdominal pain.    Objective:   Filed Vitals:   04/01/14 1559 04/01/14 1723 04/01/14 2237 04/02/14 0650  BP: 123/69 151/97 91/65 107/74  Pulse: 91 124 112 108  Temp: 98.5 F (36.9 C) 97.5 F (36.4 C) 98.6 F (37 C) 98.2 F (36.8 C)  TempSrc: Oral Oral Oral Oral  Resp: 20 20 18 20   Height: 5' (1.524 m)     Weight: 70.534 kg (155 lb 8 oz)     SpO2: 100% 94% 97% 98%    Wt Readings from Last 3 Encounters:  04/01/14 70.534 kg (155 lb 8 oz)  02/26/14 68.947 kg (152 lb)  02/19/14 68.856 kg (151 lb 12.8 oz)     Intake/Output Summary  (Last 24 hours) at 04/02/14 0818 Last data filed at 04/02/14 0400  Gross per 24 hour  Intake    290 ml  Output      0 ml  Net    290 ml    Exam  General: Well developed, well nourished, NAD, appears stated age  HEENT: NCAT, PERRLA, EOMI, Anicteic Sclera, mucous membranes moist. No pharyngeal erythema or exudates  Neck: Supple, no JVD, no masses  Cardiovascular: S1 S2 auscultated, no rubs, murmurs or gallops. Regular rate and rhythm.  Respiratory: Clear to auscultation bilaterally with equal chest rise  Abdomen: Soft, nontender, nondistended, + bowel sounds  Extremities: warm dry without cyanosis clubbing.  LLE edema and erythema, warmth, foot edema. RLL no edema.   Neuro: AAOx3, cranial nerves grossly intact. Strength 5/5 in patient's upper and lower extremities bilaterally  Skin: Erythema of the LLE with round ulcerative lesion mid shin.    Psych: Normal affect and demeanor with intact judgement and insight  Data Review   Micro Results No results found for this or any previous visit (from the past 240 hour(s)).  Radiology Reports Dg Tibia/fibula Left  04/01/2014   CLINICAL DATA:  Cellulitis  EXAM: LEFT TIBIA AND FIBULA - 2 VIEW  COMPARISON:  None.  FINDINGS: There is no evidence of fracture or other focal bone lesions. Soft tissues are unremarkable. There is peripheral vascular atherosclerotic disease.  IMPRESSION: No acute osseous abnormality of the left tibia or fibula.   Electronically Signed   By: Kathreen Devoid   On: 04/01/2014 14:35    CBC  Recent Labs Lab 04/01/14 1335 04/02/14 0458  WBC 11.3* 8.3  HGB 12.7 10.8*  HCT 38.7 33.9*  PLT 383 337  MCV 92.4 93.9  MCH 30.3 29.9  MCHC 32.8 31.9  RDW 13.5 13.6  LYMPHSABS 1.9 2.4  MONOABS 0.8 0.7  EOSABS 0.1 0.3  BASOSABS 0.0 0.0    Chemistries   Recent Labs Lab 04/01/14 1335 04/02/14 0458  NA 138 141  K 4.7 4.5  CL 99 104  CO2 25 28  GLUCOSE 108* 109*  BUN 18 20  CREATININE 1.09 1.19*  CALCIUM 9.5  8.9  MG  --  1.9  AST  --  16  ALT  --  7  ALKPHOS  --  52  BILITOT  --  0.3   ------------------------------------------------------------------------------------------------------------------ estimated creatinine clearance is 30.8 ml/min (by C-G  formula based on Cr of 1.19). ------------------------------------------------------------------------------------------------------------------ No results found for this basename: HGBA1C,  in the last 72 hours ------------------------------------------------------------------------------------------------------------------ No results found for this basename: CHOL, HDL, LDLCALC, TRIG, CHOLHDL, LDLDIRECT,  in the last 72 hours ------------------------------------------------------------------------------------------------------------------ No results found for this basename: TSH, T4TOTAL, FREET3, T3FREE, THYROIDAB,  in the last 72 hours ------------------------------------------------------------------------------------------------------------------ No results found for this basename: VITAMINB12, FOLATE, FERRITIN, TIBC, IRON, RETICCTPCT,  in the last 72 hours  Coagulation profile No results found for this basename: INR, PROTIME,  in the last 168 hours  No results found for this basename: DDIMER,  in the last 72 hours  Cardiac Enzymes No results found for this basename: CK, CKMB, TROPONINI, MYOGLOBIN,  in the last 168 hours ------------------------------------------------------------------------------------------------------------------ No components found with this basename: POCBNP,     [name] D.O. on 04/02/2014 at 8:18 AM  Between 7am to 7pm - Pager - [phone]  After 7pm go to [email or web address] - password [email or web address]  And look for the night coverage person covering for me after hours  [facility] Office  [phone]

## 2014-04-03 DIAGNOSIS — L02419 Cutaneous abscess of limb, unspecified: Secondary | ICD-10-CM | POA: Diagnosis not present

## 2014-04-03 DIAGNOSIS — E785 Hyperlipidemia, unspecified: Secondary | ICD-10-CM | POA: Diagnosis not present

## 2014-04-03 DIAGNOSIS — L0291 Cutaneous abscess, unspecified: Secondary | ICD-10-CM | POA: Diagnosis not present

## 2014-04-03 DIAGNOSIS — I10 Essential (primary) hypertension: Secondary | ICD-10-CM | POA: Diagnosis not present

## 2014-04-03 LAB — CBC WITH DIFFERENTIAL/PLATELET
Basophils Absolute: 0 10*3/uL (ref 0.0–0.1)
Basophils Relative: 0 % (ref 0–1)
Eosinophils Absolute: 0.3 10*3/uL (ref 0.0–0.7)
Eosinophils Relative: 3 % (ref 0–5)
HCT: 34.1 % — ABNORMAL LOW (ref 36.0–46.0)
Hemoglobin: 11.2 g/dL — ABNORMAL LOW (ref 12.0–15.0)
Lymphocytes Relative: 25 % (ref 12–46)
Lymphs Abs: 2.2 10*3/uL (ref 0.7–4.0)
MCH: 30.5 pg (ref 26.0–34.0)
MCHC: 32.8 g/dL (ref 30.0–36.0)
MCV: 92.9 fL (ref 78.0–100.0)
Monocytes Absolute: 0.7 10*3/uL (ref 0.1–1.0)
Monocytes Relative: 8 % (ref 3–12)
Neutro Abs: 5.6 10*3/uL (ref 1.7–7.7)
Neutrophils Relative %: 64 % (ref 43–77)
Platelets: 367 10*3/uL (ref 150–400)
RBC: 3.67 MIL/uL — ABNORMAL LOW (ref 3.87–5.11)
RDW: 13.5 % (ref 11.5–15.5)
WBC: 8.8 10*3/uL (ref 4.0–10.5)

## 2014-04-03 LAB — IRON AND TIBC
Iron: 16 ug/dL — ABNORMAL LOW (ref 42–135)
Saturation Ratios: 6 % — ABNORMAL LOW (ref 20–55)
TIBC: 276 ug/dL (ref 250–470)
UIBC: 260 ug/dL (ref 125–400)

## 2014-04-03 LAB — COMPREHENSIVE METABOLIC PANEL
ALT: 6 U/L (ref 0–35)
AST: 15 U/L (ref 0–37)
Albumin: 2.5 g/dL — ABNORMAL LOW (ref 3.5–5.2)
Alkaline Phosphatase: 50 U/L (ref 39–117)
BUN: 19 mg/dL (ref 6–23)
CO2: 27 mEq/L (ref 19–32)
Calcium: 9 mg/dL (ref 8.4–10.5)
Chloride: 103 mEq/L (ref 96–112)
Creatinine, Ser: 1.2 mg/dL — ABNORMAL HIGH (ref 0.50–1.10)
GFR calc Af Amer: 47 mL/min — ABNORMAL LOW (ref >90)
GFR calc non Af Amer: 40 mL/min — ABNORMAL LOW (ref >90)
Glucose, Bld: 104 mg/dL — ABNORMAL HIGH (ref 70–99)
Potassium: 4 mEq/L (ref 3.7–5.3)
Sodium: 142 mEq/L (ref 137–147)
Total Bilirubin: 0.3 mg/dL (ref 0.3–1.2)
Total Protein: 5.9 g/dL — ABNORMAL LOW (ref 6.0–8.3)

## 2014-04-03 LAB — MAGNESIUM: Magnesium: 1.7 mg/dL (ref 1.5–2.5)

## 2014-04-03 MED ORDER — MUPIROCIN CALCIUM 2 % EX CREA
TOPICAL_CREAM | Freq: Two times a day (BID) | CUTANEOUS | Status: DC
  Administered 2014-04-03 – 2014-04-06 (×5): via TOPICAL
  Filled 2014-04-03: qty 15

## 2014-04-03 NOTE — Progress Notes (Signed)
[facility]                                                                              Patient Demographics  [name], is a 80 y.o. female, DOB - [birth year], HBZ:[id number]  Admit date - 04/01/2014   Admitting Physician Allie Bossier, MD  Outpatient Primary MD for the patient is [facility], [place]  LOS - 2   Chief Complaint  Patient presents with  . Cellulitis      HPI: [name] is a 80 y.o. WF PMHx HTN, HLD, hypothyroidism, moderate aortic stenosis presents with worsening cellulitis to her left leg refractory to by mouth treatment (Augmentin). She states it started about a week and a half ago. It originally seemed to come from a scratch from her dog. The dog did not bite her but scratched her as the dog was falling. Patient states she has been having worsening redness and and now has a large wound, much larger than the original. She described as a mild to moderate 8/10 pain. She would normally take Tylenol for this type of pain. She had subjective fever yesterday but has not noticed a fever on day of admission. Patient states was using hydrogen peroxide to cleanse wound daily, but the wound continued to worsen. She saw her PCP approximately 3 days ago was given a shot (unknown antibiotic) and Augmentin. States after taking 2 days of Augmentin the left lower leg continued to worsen and was instructed to proceed to ED for evaluation/admit.  Assessment & Plan   Left lower extremity cellulitis -Improving slightly  -Patient received 2 doses of Augmentin as an outpatient, cellulitis began to worsen -Will consult wound care -Continue IV clindamycin and pain control -Patient currently afebrile with no leukocytosis  Hypertension -Continue ARB and Lasix  Tachycardia -Possibly secondary to pain -TSH was normal in March 2015 -Will continue to monitor  Hypothyroidism -Continue Synthroid -TSH 2.401 in March 2015  Hyperlipidemia -Continue Vytorin  Normocytic  anemia -Patient has a baseline hemoglobin of 12, currently 11.2 -FOBT and anemia panel pending  CKD, Stage III -Creatinine appears to be at baseline, will continue to monitor BMP  Code Status: Full  Family Communication: None at bedside  Disposition Plan: Admitted  Time Spent in minutes   25 minutes  Procedures None  Consults  None  DVT Prophylaxis  Lovenox  Lab Results  Component Value Date   PLT 367 04/03/2014    Medications  Scheduled Meds: . clindamycin (CLEOCIN) IV  600 mg Intravenous 3 times per day  . docusate sodium  100 mg Oral BID  . enoxaparin (LOVENOX) injection  40 mg Subcutaneous Q24H  . ezetimibe-simvastatin  1 tablet Oral QHS  . furosemide  20 mg Oral Daily  . irbesartan  300 mg Oral Daily  . levothyroxine  75 mcg Oral QAC breakfast   Continuous Infusions:  PRN Meds:.acetaminophen, acetaminophen, ondansetron (ZOFRAN) IV, ondansetron, oxyCODONE  Antibiotics    Anti-infectives   Start     Dose/Rate Route Frequency Ordered Stop   04/01/14 1545  clindamycin (CLEOCIN) IVPB 600 mg     600 mg 100 mL/hr over 30 Minutes Intravenous 3 times per day 04/01/14 1536  04/01/14 1330  clindamycin (CLEOCIN) IVPB 600 mg     600 mg 100 mL/hr over 30 Minutes Intravenous  Once 04/01/14 1318 04/01/14 1414        Subjective:   [name] seen and examined today.  Patient states that her leg pain has improved some, however, the redness worsens when she stands.  Currently she denies chest pain, shortness of breath, abdominal pain.    Objective:   Filed Vitals:   04/02/14 0650 04/02/14 1440 04/02/14 2130 04/03/14 0600  BP: 107/74 116/71 110/68 120/88  Pulse: 108 87 92 102  Temp: 98.2 F (36.8 C) 98.1 F (36.7 C) 99.2 F (37.3 C) 98.2 F (36.8 C)  TempSrc: Oral Oral Oral Oral  Resp: 20 18 20 20   Height:      Weight:      SpO2: 98% 94% 98% 100%    Wt Readings from Last 3 Encounters:  04/01/14 70.534 kg (155 lb 8 oz)  02/26/14 68.947 kg (152 lb)    02/19/14 68.856 kg (151 lb 12.8 oz)     Intake/Output Summary (Last 24 hours) at 04/03/14 0821 Last data filed at 04/03/14 0534  Gross per 24 hour  Intake    680 ml  Output      4 ml  Net    676 ml    Exam  General: Well developed, well nourished, NAD, appears stated age  HEENT: NCAT, mucous membranes moist.   Neck: Supple, no JVD, no masses  Cardiovascular: S1 S2 auscultated, no rubs, murmurs or gallops. Regular rate and rhythm.  Respiratory: Clear to auscultation bilaterally with equal chest rise  Abdomen: Soft, nontender, nondistended, + bowel sounds  Extremities: warm dry without cyanosis clubbing.  LLE edema (improving) and erythema, warmth, foot edema. RLL no edema.   Neuro: AAOx3, no focal deficits  Skin: Erythema of the LLE with round ulcerative lesion mid shin.    Psych: Normal affect and demeanor with intact judgement and insight  Data Review   Micro Results Recent Results (from the past 240 hour(s))  CULTURE, BLOOD (ROUTINE X 2)     Status: None   Collection Time    04/01/14  1:35 PM      Result Value Ref Range Status   Specimen Description BLOOD LEFT ANTECUBITAL   Final   Special Requests BOTTLES DRAWN AEROBIC AND ANAEROBIC 5ML   Final   Culture  Setup Time     Final   Value: 04/01/2014 17:17     Performed at [facility]   Culture     Final   Value:        BLOOD CULTURE RECEIVED NO GROWTH TO DATE CULTURE WILL BE HELD FOR 5 DAYS BEFORE ISSUING A FINAL NEGATIVE REPORT     Performed at [facility]   Report Status PENDING   Incomplete  CULTURE, BLOOD (ROUTINE X 2)     Status: None   Collection Time    04/01/14  1:35 PM      Result Value Ref Range Status   Specimen Description BLOOD RIGHT ANTECUBITAL   Final   Special Requests BOTTLES DRAWN AEROBIC AND ANAEROBIC 2ML   Final   Culture  Setup Time     Final   Value: 04/01/2014 17:17     Performed at [facility]   Culture     Final   Value:        BLOOD CULTURE RECEIVED NO  GROWTH TO DATE CULTURE WILL BE HELD FOR  5 DAYS BEFORE ISSUING A FINAL NEGATIVE REPORT     Performed at [facility]   Report Status PENDING   Incomplete    Radiology Reports Dg Tibia/fibula Left  04/01/2014   CLINICAL DATA:  Cellulitis  EXAM: LEFT TIBIA AND FIBULA - 2 VIEW  COMPARISON:  None.  FINDINGS: There is no evidence of fracture or other focal bone lesions. Soft tissues are unremarkable. There is peripheral vascular atherosclerotic disease.  IMPRESSION: No acute osseous abnormality of the left tibia or fibula.   Electronically Signed   By: Kathreen Devoid   On: 04/01/2014 14:35    CBC  Recent Labs Lab 04/01/14 1335 04/02/14 0458 04/03/14 0500  WBC 11.3* 8.3 8.8  HGB 12.7 10.8* 11.2*  HCT 38.7 33.9* 34.1*  PLT 383 337 367  MCV 92.4 93.9 92.9  MCH 30.3 29.9 30.5  MCHC 32.8 31.9 32.8  RDW 13.5 13.6 13.5  LYMPHSABS 1.9 2.4 2.2  MONOABS 0.8 0.7 0.7  EOSABS 0.1 0.3 0.3  BASOSABS 0.0 0.0 0.0    Chemistries   Recent Labs Lab 04/01/14 1335 04/02/14 0458 04/03/14 0500  NA 138 141 142  K 4.7 4.5 4.0  CL 99 104 103  CO2 25 28 27   GLUCOSE 108* 109* 104*  BUN 18 20 19   CREATININE 1.09 1.19* 1.20*  CALCIUM 9.5 8.9 9.0  MG  --  1.9 1.7  AST  --  16 15  ALT  --  7 6  ALKPHOS  --  52 50  BILITOT  --  0.3 0.3   ------------------------------------------------------------------------------------------------------------------ estimated creatinine clearance is 30.6 ml/min (by C-G formula based on Cr of 1.2). ------------------------------------------------------------------------------------------------------------------ No results found for this basename: HGBA1C,  in the last 72 hours ------------------------------------------------------------------------------------------------------------------ No results found for this basename: CHOL, HDL, LDLCALC, TRIG, CHOLHDL, LDLDIRECT,  in the last 72  hours ------------------------------------------------------------------------------------------------------------------ No results found for this basename: TSH, T4TOTAL, FREET3, T3FREE, THYROIDAB,  in the last 72 hours ------------------------------------------------------------------------------------------------------------------  Recent Labs  04/02/14 1228  VITAMINB12 471  FOLATE >20.0  FERRITIN 72  RETICCTPCT 1.8    Coagulation profile No results found for this basename: INR, PROTIME,  in the last 168 hours  No results found for this basename: DDIMER,  in the last 72 hours  Cardiac Enzymes No results found for this basename: CK, CKMB, TROPONINI, MYOGLOBIN,  in the last 168 hours ------------------------------------------------------------------------------------------------------------------ No components found with this basename: POCBNP,     Alene Bergerson D.O. on 04/03/2014 at 8:21 AM  Between 7am to 7pm - Pager - [phone]  After 7pm go to [email or web address] - password [email or web address]  And look for the night coverage person covering for me after hours  [facility] Office  [phone]

## 2014-04-03 NOTE — Consult Note (Addendum)
WOC wound consult note Reason for Consult: Consult requested for left leg wound, which occurred as a result of a dog scratch. Wound type: Full thickness Measurement:4X3cm dry scabbed area Wound bed: Removed outer loose nonviable tissue with forceps and scissors. Pt tolerated with minimal discomfort and no bleeding.  Wound appearance greatly improved; now 2X2X.2cm area 50% yellow, 50% red Drainage (amount, consistency, odor) Small amt yellow drainage, no odor Periwound:Surrounded by generalized edema and erythremia; this has improved since antibiotics, according to patient. Dressing procedure/placement/frequency: Bactroban for antimicrobial benefits, and foam dressing to protect and promote healing.  Discussed plan of care with patient, denies further questions. Please re-consult if further assistance is needed.  Thank-you,  Julien Girt MSN, [place], [place], [place], [place]

## 2014-04-04 DIAGNOSIS — L02419 Cutaneous abscess of limb, unspecified: Secondary | ICD-10-CM | POA: Diagnosis not present

## 2014-04-04 DIAGNOSIS — I10 Essential (primary) hypertension: Secondary | ICD-10-CM | POA: Diagnosis not present

## 2014-04-04 DIAGNOSIS — E785 Hyperlipidemia, unspecified: Secondary | ICD-10-CM | POA: Diagnosis not present

## 2014-04-04 DIAGNOSIS — L0291 Cutaneous abscess, unspecified: Secondary | ICD-10-CM | POA: Diagnosis not present

## 2014-04-04 LAB — CBC WITH DIFFERENTIAL/PLATELET
Basophils Absolute: 0 10*3/uL (ref 0.0–0.1)
Basophils Relative: 0 % (ref 0–1)
Eosinophils Absolute: 0.2 10*3/uL (ref 0.0–0.7)
Eosinophils Relative: 3 % (ref 0–5)
HCT: 35.8 % — ABNORMAL LOW (ref 36.0–46.0)
Hemoglobin: 11.5 g/dL — ABNORMAL LOW (ref 12.0–15.0)
Lymphocytes Relative: 27 % (ref 12–46)
Lymphs Abs: 2.2 10*3/uL (ref 0.7–4.0)
MCH: 29.6 pg (ref 26.0–34.0)
MCHC: 32.1 g/dL (ref 30.0–36.0)
MCV: 92.3 fL (ref 78.0–100.0)
Monocytes Absolute: 0.6 10*3/uL (ref 0.1–1.0)
Monocytes Relative: 7 % (ref 3–12)
Neutro Abs: 5.3 10*3/uL (ref 1.7–7.7)
Neutrophils Relative %: 63 % (ref 43–77)
Platelets: 385 10*3/uL (ref 150–400)
RBC: 3.88 MIL/uL (ref 3.87–5.11)
RDW: 13.3 % (ref 11.5–15.5)
WBC: 8.3 10*3/uL (ref 4.0–10.5)

## 2014-04-04 LAB — COMPREHENSIVE METABOLIC PANEL
ALT: 7 U/L (ref 0–35)
AST: 19 U/L (ref 0–37)
Albumin: 2.7 g/dL — ABNORMAL LOW (ref 3.5–5.2)
Alkaline Phosphatase: 58 U/L (ref 39–117)
BUN: 19 mg/dL (ref 6–23)
CO2: 28 mEq/L (ref 19–32)
Calcium: 8.8 mg/dL (ref 8.4–10.5)
Chloride: 100 mEq/L (ref 96–112)
Creatinine, Ser: 1.19 mg/dL — ABNORMAL HIGH (ref 0.50–1.10)
GFR calc Af Amer: 47 mL/min — ABNORMAL LOW (ref >90)
GFR calc non Af Amer: 41 mL/min — ABNORMAL LOW (ref >90)
Glucose, Bld: 100 mg/dL — ABNORMAL HIGH (ref 70–99)
Potassium: 3.8 mEq/L (ref 3.7–5.3)
Sodium: 141 mEq/L (ref 137–147)
Total Bilirubin: 0.4 mg/dL (ref 0.3–1.2)
Total Protein: 6.6 g/dL (ref 6.0–8.3)

## 2014-04-04 LAB — MAGNESIUM: Magnesium: 1.7 mg/dL (ref 1.5–2.5)

## 2014-04-04 NOTE — Progress Notes (Signed)
[facility]                                                                              Patient Demographics  [name], is a 80 y.o. female, DOB - 04-27-1927, GYF:[id number]  Admit date - 04/01/2014   Admitting Physician Allie Bossier, MD  Outpatient Primary MD for the patient is [facility], MD  LOS - 3   Chief Complaint  Patient presents with  . Cellulitis      HPI: [name] is a 80 y.o. WF PMHx HTN, HLD, hypothyroidism, moderate aortic stenosis presents with worsening cellulitis to her left leg refractory to by mouth treatment (Augmentin). She states it started about a week and a half ago. It originally seemed to come from a scratch from her dog. The dog did not bite her but scratched her as the dog was falling. Patient states she has been having worsening redness and and now has a large wound, much larger than the original. She described as a mild to moderate 8/10 pain. She would normally take Tylenol for this type of pain. She had subjective fever yesterday but has not noticed a fever on day of admission. Patient states was using hydrogen peroxide to cleanse wound daily, but the wound continued to worsen. She saw her PCP approximately 3 days ago was given a shot (unknown antibiotic) and Augmentin. States after taking 2 days of Augmentin the left lower leg continued to worsen and was instructed to proceed to ED for evaluation/admit.  Assessment & Plan   Left lower extremity cellulitis -Improving -Patient received 2 doses of Augmentin as an outpatient, cellulitis began to worsen -Wound care consulted -Continue IV clindamycin and pain control -Patient currently afebrile with no leukocytosis  Hypertension -Continue ARB and Lasix  Tachycardia -Possibly secondary to pain -TSH was normal in March 2015 -Will continue to monitor  Hypothyroidism -Continue Synthroid -TSH 2.401 in March 2015  Hyperlipidemia -Continue Vytorin  Normocytic  anemia -Patient has a baseline hemoglobin of 12, currently 11.2 -FOBT and anemia panel pending  CKD, Stage III -Creatinine appears to be at baseline, will continue to monitor BMP  Code Status: Full  Family Communication: None at bedside  Disposition Plan: Admitted, likely discharge on 04/05/2014  Time Spent in minutes   20 minutes  Procedures None  Consults  None  DVT Prophylaxis  Lovenox  Lab Results  Component Value Date   PLT 385 04/04/2014    Medications  Scheduled Meds: . clindamycin (CLEOCIN) IV  600 mg Intravenous 3 times per day  . docusate sodium  100 mg Oral BID  . enoxaparin (LOVENOX) injection  40 mg Subcutaneous Q24H  . ezetimibe-simvastatin  1 tablet Oral QHS  . furosemide  20 mg Oral Daily  . irbesartan  300 mg Oral Daily  . levothyroxine  75 mcg Oral QAC breakfast  . mupirocin cream   Topical BID   Continuous Infusions:  PRN Meds:.acetaminophen, acetaminophen, ondansetron (ZOFRAN) IV, ondansetron, oxyCODONE  Antibiotics    Anti-infectives   Start     Dose/Rate Route Frequency Ordered Stop   04/01/14 1545  clindamycin (CLEOCIN) IVPB 600 mg     600 mg 100 mL/hr  over 30 Minutes Intravenous 3 times per day 04/01/14 1536     04/01/14 1330  clindamycin (CLEOCIN) IVPB 600 mg     600 mg 100 mL/hr over 30 Minutes Intravenous  Once 04/01/14 1318 04/01/14 1414        Subjective:   [name] seen and examined today.  Patient states that her leg pain has improved some, however, the redness worsens when she stands, however, patient likes to walk.  Currently she denies chest pain, shortness of breath, abdominal pain.    Objective:   Filed Vitals:   04/03/14 0600 04/03/14 1343 04/03/14 2200 04/04/14 0500  BP: 120/88 105/71 146/89 123/79  Pulse: 102 70 120 115  Temp: 98.2 F (36.8 C) 98.2 F (36.8 C) 99.4 F (37.4 C) 98.6 F (37 C)  TempSrc: Oral Oral Oral Oral  Resp: 20 18 18 18   Height:      Weight:      SpO2: 100% 100% 96% 97%    Wt  Readings from Last 3 Encounters:  04/01/14 70.534 kg (155 lb 8 oz)  02/26/14 68.947 kg (152 lb)  02/19/14 68.856 kg (151 lb 12.8 oz)     Intake/Output Summary (Last 24 hours) at 04/04/14 0802 Last data filed at 04/04/14 0500  Gross per 24 hour  Intake    580 ml  Output      7 ml  Net    573 ml    Exam  General: Well developed, well nourished, NAD, appears stated age  HEENT: NCAT, mucous membranes moist.   Neck: Supple, no JVD, no masses  Cardiovascular: S1 S2 auscultated, no rubs, murmurs or gallops. Regular rate and rhythm.  Respiratory: Clear to auscultation bilaterally with equal chest rise  Abdomen: Soft, nontender, nondistended, + bowel sounds  Extremities: warm dry without cyanosis clubbing.  LLE edema and erythema foot edema, improved. RLL no edema.   Neuro: AAOx3, no focal deficits  Skin: Erythema of the LLE with round ulcerative lesion mid shin.    Psych: Normal affect and demeanor with intact judgement and insight  Data Review   Micro Results Recent Results (from the past 240 hour(s))  CULTURE, BLOOD (ROUTINE X 2)     Status: None   Collection Time    04/01/14  1:35 PM      Result Value Ref Range Status   Specimen Description BLOOD LEFT ANTECUBITAL   Final   Special Requests BOTTLES DRAWN AEROBIC AND ANAEROBIC 5ML   Final   Culture  Setup Time     Final   Value: 04/01/2014 17:17     Performed at [facility]   Culture     Final   Value:        BLOOD CULTURE RECEIVED NO GROWTH TO DATE CULTURE WILL BE HELD FOR 5 DAYS BEFORE ISSUING A FINAL NEGATIVE REPORT     Performed at [facility]   Report Status PENDING   Incomplete  CULTURE, BLOOD (ROUTINE X 2)     Status: None   Collection Time    04/01/14  1:35 PM      Result Value Ref Range Status   Specimen Description BLOOD RIGHT ANTECUBITAL   Final   Special Requests BOTTLES DRAWN AEROBIC AND ANAEROBIC 2ML   Final   Culture  Setup Time     Final   Value: 04/01/2014 17:17     Performed  at [facility]   Culture     Final   Value:  BLOOD CULTURE RECEIVED NO GROWTH TO DATE CULTURE WILL BE HELD FOR 5 DAYS BEFORE ISSUING A FINAL NEGATIVE REPORT     Performed at [facility]   Report Status PENDING   Incomplete    Radiology Reports Dg Tibia/fibula Left  04/01/2014   CLINICAL DATA:  Cellulitis  EXAM: LEFT TIBIA AND FIBULA - 2 VIEW  COMPARISON:  None.  FINDINGS: There is no evidence of fracture or other focal bone lesions. Soft tissues are unremarkable. There is peripheral vascular atherosclerotic disease.  IMPRESSION: No acute osseous abnormality of the left tibia or fibula.   Electronically Signed   By: Kathreen Devoid   On: 04/01/2014 14:35    CBC  Recent Labs Lab 04/01/14 1335 04/02/14 0458 04/03/14 0500 04/04/14 0603  WBC 11.3* 8.3 8.8 8.3  HGB 12.7 10.8* 11.2* 11.5*  HCT 38.7 33.9* 34.1* 35.8*  PLT 383 337 367 385  MCV 92.4 93.9 92.9 92.3  MCH 30.3 29.9 30.5 29.6  MCHC 32.8 31.9 32.8 32.1  RDW 13.5 13.6 13.5 13.3  LYMPHSABS 1.9 2.4 2.2 2.2  MONOABS 0.8 0.7 0.7 0.6  EOSABS 0.1 0.3 0.3 0.2  BASOSABS 0.0 0.0 0.0 0.0    Chemistries   Recent Labs Lab 04/01/14 1335 04/02/14 0458 04/03/14 0500 04/04/14 0603  NA 138 141 142 141  K 4.7 4.5 4.0 3.8  CL 99 104 103 100  CO2 25 28 27 28   GLUCOSE 108* 109* 104* 100*  BUN 18 20 19 19   CREATININE 1.09 1.19* 1.20* 1.19*  CALCIUM 9.5 8.9 9.0 8.8  MG  --  1.9 1.7 1.7  AST  --  16 15 19   ALT  --  7 6 7   ALKPHOS  --  52 50 58  BILITOT  --  0.3 0.3 0.4   ------------------------------------------------------------------------------------------------------------------ estimated creatinine clearance is 30.8 ml/min (by C-G formula based on Cr of 1.19). ------------------------------------------------------------------------------------------------------------------ No results found for this basename: HGBA1C,  in the last 72  hours ------------------------------------------------------------------------------------------------------------------ No results found for this basename: CHOL, HDL, LDLCALC, TRIG, CHOLHDL, LDLDIRECT,  in the last 72 hours ------------------------------------------------------------------------------------------------------------------ No results found for this basename: TSH, T4TOTAL, FREET3, T3FREE, THYROIDAB,  in the last 72 hours ------------------------------------------------------------------------------------------------------------------  Recent Labs  04/02/14 1228  VITAMINB12 471  FOLATE >20.0  FERRITIN 72  TIBC 276  IRON 16*  RETICCTPCT 1.8    Coagulation profile No results found for this basename: INR, PROTIME,  in the last 168 hours  No results found for this basename: DDIMER,  in the last 72 hours  Cardiac Enzymes No results found for this basename: CK, CKMB, TROPONINI, MYOGLOBIN,  in the last 168 hours ------------------------------------------------------------------------------------------------------------------ No components found with this basename: POCBNP,     Laurence Crofford D.O. on 04/04/2014 at 8:02 AM  Between 7am to 7pm - Pager - [phone]  After 7pm go to [email or web address] - password [email or web address]  And look for the night coverage person covering for me after hours  [facility] Office  [phone]

## 2014-04-05 DIAGNOSIS — L0291 Cutaneous abscess, unspecified: Secondary | ICD-10-CM | POA: Diagnosis not present

## 2014-04-05 DIAGNOSIS — L02419 Cutaneous abscess of limb, unspecified: Secondary | ICD-10-CM | POA: Diagnosis not present

## 2014-04-05 DIAGNOSIS — I10 Essential (primary) hypertension: Secondary | ICD-10-CM | POA: Diagnosis not present

## 2014-04-05 DIAGNOSIS — E785 Hyperlipidemia, unspecified: Secondary | ICD-10-CM | POA: Diagnosis not present

## 2014-04-05 LAB — CBC WITH DIFFERENTIAL/PLATELET
Basophils Absolute: 0 10*3/uL (ref 0.0–0.1)
Basophils Relative: 0 % (ref 0–1)
Eosinophils Absolute: 0.3 10*3/uL (ref 0.0–0.7)
Eosinophils Relative: 3 % (ref 0–5)
HCT: 35.1 % — ABNORMAL LOW (ref 36.0–46.0)
Hemoglobin: 11.7 g/dL — ABNORMAL LOW (ref 12.0–15.0)
Lymphocytes Relative: 26 % (ref 12–46)
Lymphs Abs: 2.6 10*3/uL (ref 0.7–4.0)
MCH: 30.5 pg (ref 26.0–34.0)
MCHC: 33.3 g/dL (ref 30.0–36.0)
MCV: 91.4 fL (ref 78.0–100.0)
Monocytes Absolute: 0.7 10*3/uL (ref 0.1–1.0)
Monocytes Relative: 7 % (ref 3–12)
Neutro Abs: 6.2 10*3/uL (ref 1.7–7.7)
Neutrophils Relative %: 64 % (ref 43–77)
Platelets: 356 10*3/uL (ref 150–400)
RBC: 3.84 MIL/uL — ABNORMAL LOW (ref 3.87–5.11)
RDW: 13.2 % (ref 11.5–15.5)
WBC: 9.8 10*3/uL (ref 4.0–10.5)

## 2014-04-05 LAB — COMPREHENSIVE METABOLIC PANEL
ALT: 8 U/L (ref 0–35)
AST: 19 U/L (ref 0–37)
Albumin: 2.8 g/dL — ABNORMAL LOW (ref 3.5–5.2)
Alkaline Phosphatase: 59 U/L (ref 39–117)
BUN: 18 mg/dL (ref 6–23)
CO2: 24 mEq/L (ref 19–32)
Calcium: 9.1 mg/dL (ref 8.4–10.5)
Chloride: 98 mEq/L (ref 96–112)
Creatinine, Ser: 1.15 mg/dL — ABNORMAL HIGH (ref 0.50–1.10)
GFR calc Af Amer: 49 mL/min — ABNORMAL LOW (ref >90)
GFR calc non Af Amer: 42 mL/min — ABNORMAL LOW (ref >90)
Glucose, Bld: 109 mg/dL — ABNORMAL HIGH (ref 70–99)
Potassium: 4 mEq/L (ref 3.7–5.3)
Sodium: 137 mEq/L (ref 137–147)
Total Bilirubin: 0.4 mg/dL (ref 0.3–1.2)
Total Protein: 6.5 g/dL (ref 6.0–8.3)

## 2014-04-05 LAB — MAGNESIUM: Magnesium: 1.7 mg/dL (ref 1.5–2.5)

## 2014-04-05 MED ORDER — FERROUS SULFATE 325 (65 FE) MG PO TABS
325.0000 mg | ORAL_TABLET | Freq: Two times a day (BID) | ORAL | Status: DC
  Administered 2014-04-05 – 2014-04-06 (×2): 325 mg via ORAL
  Filled 2014-04-05 (×4): qty 1

## 2014-04-05 NOTE — Progress Notes (Signed)
[facility]                                                                              Patient Demographics  [name], is a 80 y.o. female, DOB - June 30, 1927, ZOX:[id number]  Admit date - 04/01/2014   Admitting Physician Allie Bossier, MD  Outpatient Primary MD for the patient is [facility], MD  LOS - 4   Chief Complaint  Patient presents with  . Cellulitis      HPI: [name] is a 80 y.o. WF PMHx HTN, HLD, hypothyroidism, moderate aortic stenosis presents with worsening cellulitis to her left leg refractory to by mouth treatment (Augmentin). She states it started about a week and a half ago. It originally seemed to come from a scratch from her dog. The dog did not bite her but scratched her as the dog was falling. Patient states she has been having worsening redness and and now has a large wound, much larger than the original. She described as a mild to moderate 8/10 pain. She would normally take Tylenol for this type of pain. She had subjective fever yesterday but has not noticed a fever on day of admission. Patient states was using hydrogen peroxide to cleanse wound daily, but the wound continued to worsen. She saw her PCP approximately 3 days ago was given a shot (unknown antibiotic) and Augmentin. States after taking 2 days of Augmentin the left lower leg continued to worsen and was instructed to proceed to ED for evaluation/admit.  Assessment & Plan   Left lower extremity cellulitis -Improving -Patient received 2 doses of Augmentin as an outpatient, cellulitis began to worsen -Wound care consulted -Continue IV clindamycin and pain control -Patient currently afebrile with no leukocytosis -Patient feels that she cannot walk on her foot and is afraid to go home alone -Will consult PT for eval and treatment  Hypertension -Continue ARB and Lasix  Tachycardia -Possibly secondary to pain -TSH was normal in March 2015 -Will continue to  monitor  Hypothyroidism -Continue Synthroid -TSH 2.401 in March 2015  Hyperlipidemia -Continue Vytorin  Normocytic anemia -Patient has a baseline hemoglobin of 12, currently 11.7 -Iron 16, Ferritin 72 -Will start patient on iron supplementation  CKD, Stage III -Creatinine appears to be at baseline, will continue to monitor BMP  Code Status: Full  Family Communication: None at bedside  Disposition Plan: Admitted, likely discharge on 04/06/2014  Time Spent in minutes   20 minutes  Procedures None  Consults  None  DVT Prophylaxis  Lovenox  Lab Results  Component Value Date   PLT 356 04/05/2014    Medications  Scheduled Meds: . clindamycin (CLEOCIN) IV  600 mg Intravenous 3 times per day  . docusate sodium  100 mg Oral BID  . enoxaparin (LOVENOX) injection  40 mg Subcutaneous Q24H  . ezetimibe-simvastatin  1 tablet Oral QHS  . furosemide  20 mg Oral Daily  . irbesartan  300 mg Oral Daily  . levothyroxine  75 mcg Oral QAC breakfast  . mupirocin cream   Topical BID   Continuous Infusions:  PRN Meds:.acetaminophen, acetaminophen, ondansetron (ZOFRAN) IV, ondansetron, oxyCODONE  Antibiotics    Anti-infectives  Start     Dose/Rate Route Frequency Ordered Stop   04/01/14 1545  clindamycin (CLEOCIN) IVPB 600 mg     600 mg 100 mL/hr over 30 Minutes Intravenous 3 times per day 04/01/14 1536     04/01/14 1330  clindamycin (CLEOCIN) IVPB 600 mg     600 mg 100 mL/hr over 30 Minutes Intravenous  Once 04/01/14 1318 04/01/14 1414        Subjective:   [name] seen and examined today.  Patient states that her leg pain has improved some, however, is worried about going home alone and her ability to move around.  She denies chest pain, shortness of breath, abdominal pain, nausea, vomiting.  Objective:   Filed Vitals:   04/04/14 0500 04/04/14 1308 04/04/14 2130 04/05/14 0556  BP: 123/79 142/84 152/93 116/83  Pulse: 115 111 124 108  Temp: 98.6 F (37 C) 98.4 F  (36.9 C) 98.8 F (37.1 C) 98.6 F (37 C)  TempSrc: Oral Oral Oral Oral  Resp: 18 18 18 18   Height:      Weight:      SpO2: 97% 99% 96% 96%    Wt Readings from Last 3 Encounters:  04/01/14 70.534 kg (155 lb 8 oz)  02/26/14 68.947 kg (152 lb)  02/19/14 68.856 kg (151 lb 12.8 oz)    No intake or output data in the 24 hours ending 04/05/14 0815  Exam  General: Well developed, well nourished, NAD, appears stated age  HEENT: NCAT, mucous membranes moist.   Neck: Supple, no JVD, no masses  Cardiovascular: S1 S2 auscultated, no rubs, murmurs or gallops. Regular rate and rhythm.  Respiratory: Clear to auscultation bilaterally with equal chest rise  Abdomen: Soft, nontender, nondistended, + bowel sounds  Extremities: warm dry without cyanosis clubbing.  LLE edema and erythema foot edema, improved. RLL no edema.   Neuro: AAOx3, no focal deficits  Skin: Erythema of the LLE with round ulcerative lesion mid shin, imrpoved.  Bandage in place on left shin.  Psych: Normal affect and demeanor   Data Review   Micro Results Recent Results (from the past 240 hour(s))  CULTURE, BLOOD (ROUTINE X 2)     Status: None   Collection Time    04/01/14  1:35 PM      Result Value Ref Range Status   Specimen Description BLOOD LEFT ANTECUBITAL   Final   Special Requests BOTTLES DRAWN AEROBIC AND ANAEROBIC 5ML   Final   Culture  Setup Time     Final   Value: 04/01/2014 17:17     Performed at [facility]   Culture     Final   Value:        BLOOD CULTURE RECEIVED NO GROWTH TO DATE CULTURE WILL BE HELD FOR 5 DAYS BEFORE ISSUING A FINAL NEGATIVE REPORT     Performed at [facility]   Report Status PENDING   Incomplete  CULTURE, BLOOD (ROUTINE X 2)     Status: None   Collection Time    04/01/14  1:35 PM      Result Value Ref Range Status   Specimen Description BLOOD RIGHT ANTECUBITAL   Final   Special Requests BOTTLES DRAWN AEROBIC AND ANAEROBIC 2ML   Final   Culture  Setup  Time     Final   Value: 04/01/2014 17:17     Performed at [facility]   Culture     Final   Value:  BLOOD CULTURE RECEIVED NO GROWTH TO DATE CULTURE WILL BE HELD FOR 5 DAYS BEFORE ISSUING A FINAL NEGATIVE REPORT     Performed at [facility]   Report Status PENDING   Incomplete    Radiology Reports Dg Tibia/fibula Left  04/01/2014   CLINICAL DATA:  Cellulitis  EXAM: LEFT TIBIA AND FIBULA - 2 VIEW  COMPARISON:  None.  FINDINGS: There is no evidence of fracture or other focal bone lesions. Soft tissues are unremarkable. There is peripheral vascular atherosclerotic disease.  IMPRESSION: No acute osseous abnormality of the left tibia or fibula.   Electronically Signed   By: Kathreen Devoid   On: 04/01/2014 14:35    CBC  Recent Labs Lab 04/01/14 1335 04/02/14 0458 04/03/14 0500 04/04/14 0603 04/05/14 0553  WBC 11.3* 8.3 8.8 8.3 9.8  HGB 12.7 10.8* 11.2* 11.5* 11.7*  HCT 38.7 33.9* 34.1* 35.8* 35.1*  PLT 383 337 367 385 356  MCV 92.4 93.9 92.9 92.3 91.4  MCH 30.3 29.9 30.5 29.6 30.5  MCHC 32.8 31.9 32.8 32.1 33.3  RDW 13.5 13.6 13.5 13.3 13.2  LYMPHSABS 1.9 2.4 2.2 2.2 2.6  MONOABS 0.8 0.7 0.7 0.6 0.7  EOSABS 0.1 0.3 0.3 0.2 0.3  BASOSABS 0.0 0.0 0.0 0.0 0.0    Chemistries   Recent Labs Lab 04/01/14 1335 04/02/14 0458 04/03/14 0500 04/04/14 0603 04/05/14 0553  NA 138 141 142 141 137  K 4.7 4.5 4.0 3.8 4.0  CL 99 104 103 100 98  CO2 25 28 27 28 24   GLUCOSE 108* 109* 104* 100* 109*  BUN 18 20 19 19 18   CREATININE 1.09 1.19* 1.20* 1.19* 1.15*  CALCIUM 9.5 8.9 9.0 8.8 9.1  MG  --  1.9 1.7 1.7 1.7  AST  --  16 15 19 19   ALT  --  7 6 7 8   ALKPHOS  --  52 50 58 59  BILITOT  --  0.3 0.3 0.4 0.4   ------------------------------------------------------------------------------------------------------------------ estimated creatinine clearance is 31.9 ml/min (by C-G formula based on Cr of  1.15). ------------------------------------------------------------------------------------------------------------------ No results found for this basename: HGBA1C,  in the last 72 hours ------------------------------------------------------------------------------------------------------------------ No results found for this basename: CHOL, HDL, LDLCALC, TRIG, CHOLHDL, LDLDIRECT,  in the last 72 hours ------------------------------------------------------------------------------------------------------------------ No results found for this basename: TSH, T4TOTAL, FREET3, T3FREE, THYROIDAB,  in the last 72 hours ------------------------------------------------------------------------------------------------------------------  Recent Labs  04/02/14 1228  VITAMINB12 471  FOLATE >20.0  FERRITIN 72  TIBC 276  IRON 16*  RETICCTPCT 1.8    Coagulation profile No results found for this basename: INR, PROTIME,  in the last 168 hours  No results found for this basename: DDIMER,  in the last 72 hours  Cardiac Enzymes No results found for this basename: CK, CKMB, TROPONINI, MYOGLOBIN,  in the last 168 hours ------------------------------------------------------------------------------------------------------------------ No components found with this basename: POCBNP,     Jamayah Myszka D.O. on 04/05/2014 at 8:15 AM  Between 7am to 7pm - Pager - [phone]  After 7pm go to [email or web address] - password [email or web address]  And look for the night coverage person covering for me after hours  [facility] Office  [phone]

## 2014-04-05 NOTE — Evaluation (Signed)
Physical Therapy Evaluation Patient Details Name: [name] MRN: [id number] DOB: [birth year] Today's Date: 04/05/2014   History of Present Illness  80 yo female adm with cellulitis LLE  Clinical Impression  Pt at baseline level for functional mobility;  She is inquiring about HHRN for dressing changes; no further needs from PT standpoint;    Follow Up Recommendations No PT follow up    Equipment Recommendations  None recommended by PT    Recommendations for Other Services       Precautions / Restrictions Precautions Precautions: None      Mobility  Bed Mobility Overal bed mobility: Independent                Transfers Overall transfer level: Modified independent Equipment used: None                Ambulation/Gait Ambulation/Gait assistance: Supervision;Min guard;Modified independent (Device/Increase time) Ambulation Distance (Feet): 300 Feet Assistive device: None Gait Pattern/deviations: Step-through pattern;Drifts right/left;Decreased stride length     General Gait Details: bil hip external rotation; no overt LOB  Stairs            Wheelchair Mobility    Modified Rankin (Stroke Patients Only)       Balance Overall balance assessment: Modified Independent   Sitting balance-Leahy Scale: Normal       Standing balance-Leahy Scale: Normal               High level balance activites: Direction changes;Turns;Head turns;Backward walking High Level Balance Comments: no LOB             Pertinent Vitals/Pain Denies pain    Home Living Family/patient expects to be discharged to:: Private residence Living Arrangements: Alone   Type of Home:  (condo) Home Access: Stairs to enter   Entrance Stairs-Number of Steps: 0 Home Layout: One level Home Equipment: None      Prior Function Level of Independence: Independent               Hand Dominance        Extremity/Trunk Assessment   Upper Extremity  Assessment: Overall WFL for tasks assessed           Lower Extremity Assessment: Overall WFL for tasks assessed         Communication      Cognition Arousal/Alertness: Awake/alert Behavior During Therapy: WFL for tasks assessed/performed Overall Cognitive Status: Within Functional Limits for tasks assessed                      General Comments      Exercises        Assessment/Plan    PT Assessment Patent does not need any further PT services  PT Diagnosis     PT Problem List    PT Treatment Interventions     PT Goals (Current goals can be found in the Care Plan section) Acute Rehab PT Goals Patient Stated Goal:  wants [facility] for wound care PT Goal Formulation: With patient    Frequency     Barriers to discharge        Co-evaluation               End of Session   Activity Tolerance: Patient tolerated treatment well Patient left: Other (comment) (window seat) Nurse Communication: Mobility status         Time: 4098-1191 PT Time Calculation (min): 13 min   Charges:   PT Evaluation $Initial PT Evaluation Tier  I: 1 Procedure     PT G Codes:          Neil Crouch 04/05/2014, 2:42 PM

## 2014-04-06 DIAGNOSIS — M79609 Pain in unspecified limb: Principal | ICD-10-CM

## 2014-04-06 DIAGNOSIS — L02419 Cutaneous abscess of limb, unspecified: Secondary | ICD-10-CM | POA: Diagnosis not present

## 2014-04-06 DIAGNOSIS — I10 Essential (primary) hypertension: Secondary | ICD-10-CM | POA: Diagnosis not present

## 2014-04-06 DIAGNOSIS — R609 Edema, unspecified: Secondary | ICD-10-CM | POA: Diagnosis not present

## 2014-04-06 DIAGNOSIS — E785 Hyperlipidemia, unspecified: Secondary | ICD-10-CM | POA: Diagnosis not present

## 2014-04-06 LAB — CBC WITH DIFFERENTIAL/PLATELET
Basophils Absolute: 0 10*3/uL (ref 0.0–0.1)
Basophils Relative: 0 % (ref 0–1)
Eosinophils Absolute: 0.2 10*3/uL (ref 0.0–0.7)
Eosinophils Relative: 2 % (ref 0–5)
HCT: 36.6 % (ref 36.0–46.0)
Hemoglobin: 11.9 g/dL — ABNORMAL LOW (ref 12.0–15.0)
Lymphocytes Relative: 21 % (ref 12–46)
Lymphs Abs: 2.2 10*3/uL (ref 0.7–4.0)
MCH: 30 pg (ref 26.0–34.0)
MCHC: 32.5 g/dL (ref 30.0–36.0)
MCV: 92.2 fL (ref 78.0–100.0)
Monocytes Absolute: 0.8 10*3/uL (ref 0.1–1.0)
Monocytes Relative: 8 % (ref 3–12)
Neutro Abs: 7.3 10*3/uL (ref 1.7–7.7)
Neutrophils Relative %: 69 % (ref 43–77)
Platelets: 332 10*3/uL (ref 150–400)
RBC: 3.97 MIL/uL (ref 3.87–5.11)
RDW: 13.1 % (ref 11.5–15.5)
WBC: 10.6 10*3/uL — ABNORMAL HIGH (ref 4.0–10.5)

## 2014-04-06 LAB — COMPREHENSIVE METABOLIC PANEL
ALT: 8 U/L (ref 0–35)
AST: 20 U/L (ref 0–37)
Albumin: 2.7 g/dL — ABNORMAL LOW (ref 3.5–5.2)
Alkaline Phosphatase: 60 U/L (ref 39–117)
BUN: 20 mg/dL (ref 6–23)
CO2: 26 mEq/L (ref 19–32)
Calcium: 9.1 mg/dL (ref 8.4–10.5)
Chloride: 100 mEq/L (ref 96–112)
Creatinine, Ser: 1.15 mg/dL — ABNORMAL HIGH (ref 0.50–1.10)
GFR calc Af Amer: 49 mL/min — ABNORMAL LOW (ref >90)
GFR calc non Af Amer: 42 mL/min — ABNORMAL LOW (ref >90)
Glucose, Bld: 108 mg/dL — ABNORMAL HIGH (ref 70–99)
Potassium: 4 mEq/L (ref 3.7–5.3)
Sodium: 139 mEq/L (ref 137–147)
Total Bilirubin: 0.4 mg/dL (ref 0.3–1.2)
Total Protein: 6.5 g/dL (ref 6.0–8.3)

## 2014-04-06 LAB — MAGNESIUM: Magnesium: 1.8 mg/dL (ref 1.5–2.5)

## 2014-04-06 MED ORDER — CLINDAMYCIN HCL 300 MG PO CAPS: 300.0000 mg | ORAL_CAPSULE | Freq: Four times a day (QID) | ORAL | Status: DC

## 2014-04-06 MED ORDER — MUPIROCIN CALCIUM 2 % EX CREA: TOPICAL_CREAM | Freq: Two times a day (BID) | CUTANEOUS | Status: DC

## 2014-04-06 MED ORDER — POTASSIUM CHLORIDE CRYS ER 20 MEQ PO TBCR: 40.0000 meq | EXTENDED_RELEASE_TABLET | Freq: Two times a day (BID) | ORAL | Status: DC

## 2014-04-06 MED ORDER — FERROUS SULFATE 325 (65 FE) MG PO TABS: 325.0000 mg | ORAL_TABLET | Freq: Two times a day (BID) | ORAL | Status: DC

## 2014-04-06 NOTE — Discharge Summary (Signed)
Physician Discharge Summary  [name] SLH:[id number] DOB: May 12, 1927 DOA: 04/01/2014  PCP: Rachell Cipro, MD  Admit date: 04/01/2014 Discharge date: 04/06/2014  Time spent: 35 minutes  Recommendations for Outpatient Follow-up:  Patient will be discharged to home with home health wound care. She is to follow up with her primary care physician within one week of discharge. Patient to continue taking her medications as prescribed.  Patient should follow a cardiac healthy diet.  Discharge Diagnoses:  Left lower extremity cellulitis Hypertension Tachycardia Hypothyroidism Hyperlipidemia Normocytic anemia Chronic kidney disease stage III  Discharge Condition: Stable  Diet recommendation: Cardiac healthy  Filed Weights   04/01/14 1559  Weight: 70.534 kg (155 lb 8 oz)    History of present illness:  [name] is a 80 y.o. WF PMHx HTN, HLD, hypothyroidism, moderate aortic stenosis presents with worsening cellulitis to her left leg refractory to by mouth treatment (Augmentin). She states it started about a week and a half ago. It originally seemed to come from a scratch from her dog. The dog did not bite her but scratched her as the dog was falling. Patient states she has been having worsening redness and and now has a large wound, much larger than the original. She described as a mild to moderate 8/10 pain. She would normally take Tylenol for this type of pain. She had subjective fever yesterday but has not noticed a fever on day of admission. Patient states was using hydrogen peroxide to cleanse wound daily, but the wound continued to worsen. She saw her PCP approximately 3 days ago was given a shot (unknown antibiotic) and Augmentin. States after taking 2 days of Augmentin the left lower leg continued to worsen and was instructed to proceed to ED for evaluation/admit.  Hospital Course:  Left lower extremity cellulitis  -Improving  -Patient received 2 doses of Augmentin  as an outpatient, cellulitis began to worsen  -Wound care consulted  -Initially placed on IV clindamycin, will discharge with PO clindamycin  -Patient currently afebrile with no leukocytosis  -PT consulted and did not recommend continued services  Hypertension  -Continue ARB and Lasix   Tachycardia  -Possibly secondary to pain  -TSH was normal in March 2015   Hypothyroidism  -Continue Synthroid  -TSH 2.401 in March 2015   Hyperlipidemia  -Continue Vytorin   Normocytic anemia  -Patient has a baseline hemoglobin of 12, currently 11.7  -Iron 16, Ferritin 72  -Continue iron supplementation   CKD, Stage III  -Creatinine appears to be at baseline, will continue to monitor BMP   Procedures: None  Consultations: None  Discharge Exam: Filed Vitals:   04/06/14 0636  BP: 132/73  Pulse: 102  Temp: 98.5 F (36.9 C)  Resp: 18   Exam  General: Well developed, well nourished, NAD, appears stated age  HEENT: NCAT, mucous membranes moist.  Neck: Supple, no JVD, no masses  Cardiovascular: S1 S2 auscultated, no rubs, murmurs or gallops. Regular rate and rhythm.  Respiratory: Clear to auscultation bilaterally with equal chest rise  Abdomen: Soft, nontender, nondistended, + bowel sounds  Extremities: warm dry without cyanosis clubbing. LLE edema and erythema foot edema, improved. RLL no edema.  Neuro: AAOx3, no focal deficits  Skin: Erythema of the LLE with round ulcerative lesion mid shin, imrpoved. Bandage in place on left shin.  Psych: Normal affect and demeanor   Discharge Instructions      Discharge Instructions   Diet - low sodium heart healthy    Complete by:  As  directed      Discharge instructions    Complete by:  As directed   Patient will be discharged to home with home health wound care. She is to follow up with her primary care physician within one week of discharge. Patient to continue taking her medications as prescribed.  Patient should follow a cardiac  healthy diet.     Increase activity slowly    Complete by:  As directed             Medication List    STOP taking these medications       amoxicillin-clavulanate 875-125 MG per tablet  Commonly known as:  AUGMENTIN      TAKE these medications       acetaminophen 500 MG tablet  Commonly known as:  TYLENOL  Take 500 mg by mouth every 6 (six) hours as needed (pain).     calcium carbonate 600 MG Tabs tablet  Commonly known as:  OS-CAL  Take 1,800 mg by mouth daily.     clindamycin 300 MG capsule  Commonly known as:  CLEOCIN  Take 1 capsule (300 mg total) by mouth 4 (four) times daily.     diclofenac sodium 1 % Gel  Commonly known as:  VOLTAREN  Apply 2 g topically 4 (four) times daily as needed (For pain).     dicyclomine 20 MG tablet  Commonly known as:  BENTYL  Take 20 mg by mouth every 6 (six) hours as needed. For stomach irritability.     estrogens (conjugated) 0.625 MG tablet  Commonly known as:  PREMARIN  Take 0.625 mg by mouth daily. Take daily for 21 days then do not take for 7 days.     ezetimibe-simvastatin 10-40 MG per tablet  Commonly known as:  VYTORIN  Take 1 tablet by mouth at bedtime.     ferrous sulfate 325 (65 FE) MG tablet  Take 1 tablet (325 mg total) by mouth 2 (two) times daily with a meal.     Fish Oil 1000 MG Caps  Take 1 capsule by mouth 3 (three) times daily.     furosemide 20 MG tablet  Commonly known as:  LASIX  Take 20 mg by mouth daily.     levothyroxine 75 MCG tablet  Commonly known as:  SYNTHROID, LEVOTHROID  Take 75 mcg by mouth daily.     medroxyPROGESTERone 2.5 MG tablet  Commonly known as:  PROVERA  Take 5 mg by mouth daily. For 10 days each month     mupirocin cream 2 %  Commonly known as:  BACTROBAN  Apply topically 2 (two) times daily.     olmesartan 40 MG tablet  Commonly known as:  BENICAR  Take 40 mg by mouth daily.     olopatadine 0.1 % ophthalmic solution  Commonly known as:  PATANOL  Place 1 drop into  both eyes 2 (two) times daily.       Allergies  Allergen Reactions  . Aspirin Nausea And Vomiting  . Codeine Nausea And Vomiting    "Deathly Sick"  . Coreg [Carvedilol] Other (See Comments)    Fatigue/ ill  . Penicillins Rash   Follow-up Information   Follow up with [facility], [facility]. Schedule an appointment as soon as possible for a visit in 1 week. [facility] followup)    Specialty:  Family Medicine   Contact information:   [address] [place] [address] [phone]        The results  of significant diagnostics from this hospitalization (including imaging, microbiology, ancillary and laboratory) are listed below for reference.    Significant Diagnostic Studies: Dg Tibia/fibula Left  04/01/2014   CLINICAL DATA:  Cellulitis  EXAM: LEFT TIBIA AND FIBULA - 2 VIEW  COMPARISON:  None.  FINDINGS: There is no evidence of fracture or other focal bone lesions. Soft tissues are unremarkable. There is peripheral vascular atherosclerotic disease.  IMPRESSION: No acute osseous abnormality of the left tibia or fibula.   Electronically Signed   By: Kathreen Devoid   On: 04/01/2014 14:35    Microbiology: Recent Results (from the past 240 hour(s))  CULTURE, BLOOD (ROUTINE X 2)     Status: None   Collection Time    04/01/14  1:35 PM      Result Value Ref Range Status   Specimen Description BLOOD LEFT ANTECUBITAL   Final   Special Requests BOTTLES DRAWN AEROBIC AND ANAEROBIC 5ML   Final   Culture  Setup Time     Final   Value: 04/01/2014 17:17     Performed at [facility]   Culture     Final   Value:        BLOOD CULTURE RECEIVED NO GROWTH TO DATE CULTURE WILL BE HELD FOR 5 DAYS BEFORE ISSUING A FINAL NEGATIVE REPORT     Performed at [facility]   Report Status PENDING   Incomplete  CULTURE, BLOOD (ROUTINE X 2)     Status: None   Collection Time    04/01/14  1:35 PM      Result Value Ref Range Status   Specimen Description BLOOD RIGHT ANTECUBITAL   Final     Special Requests BOTTLES DRAWN AEROBIC AND ANAEROBIC 2ML   Final   Culture  Setup Time     Final   Value: 04/01/2014 17:17     Performed at [facility]   Culture     Final   Value:        BLOOD CULTURE RECEIVED NO GROWTH TO DATE CULTURE WILL BE HELD FOR 5 DAYS BEFORE ISSUING A FINAL NEGATIVE REPORT     Performed at [facility]   Report Status PENDING   Incomplete     Labs: Basic Metabolic Panel:  Recent Labs Lab 04/02/14 0458 04/03/14 0500 04/04/14 0603 04/05/14 0553 04/06/14 0525  NA 141 142 141 137 139  K 4.5 4.0 3.8 4.0 4.0  CL 104 103 100 98 100  CO2 28 27 28 24 26   GLUCOSE 109* 104* 100* 109* 108*  BUN 20 19 19 18 20   CREATININE 1.19* 1.20* 1.19* 1.15* 1.15*  CALCIUM 8.9 9.0 8.8 9.1 9.1  MG 1.9 1.7 1.7 1.7 1.8   Liver Function Tests:  Recent Labs Lab 04/02/14 0458 04/03/14 0500 04/04/14 0603 04/05/14 0553 04/06/14 0525  AST 16 15 19 19 20   ALT 7 6 7 8 8   ALKPHOS 52 50 58 59 60  BILITOT 0.3 0.3 0.4 0.4 0.4  PROT 6.0 5.9* 6.6 6.5 6.5  ALBUMIN 2.5* 2.5* 2.7* 2.8* 2.7*   No results found for this basename: LIPASE, AMYLASE,  in the last 168 hours No results found for this basename: AMMONIA,  in the last 168 hours CBC:  Recent Labs Lab 04/02/14 0458 04/03/14 0500 04/04/14 0603 04/05/14 0553 04/06/14 0525  WBC 8.3 8.8 8.3 9.8 10.6*  NEUTROABS 4.9 5.6 5.3 6.2 7.3  HGB 10.8* 11.2* 11.5* 11.7* 11.9*  HCT 33.9* 34.1* 35.8* 35.1* 36.6  MCV 93.9 92.9 92.3 91.4 92.2  PLT 337 367 385 356 332   Cardiac Enzymes: No results found for this basename: CKTOTAL, CKMB, CKMBINDEX, TROPONINI,  in the last 168 hours BNP: BNP (last 3 results) No results found for this basename: PROBNP,  in the last 8760 hours CBG: No results found for this basename: GLUCAP,  in the last 168 hours     Signed:  Cristal Ford  [facility] 04/06/2014, 9:30 AM

## 2014-04-06 NOTE — Discharge Instructions (Signed)
Cellulitis Cellulitis is an infection of the skin and the tissue beneath it. The infected area is usually red and tender. Cellulitis occurs most often in the arms and lower legs.  CAUSES  Cellulitis is caused by bacteria that enter the skin through cracks or cuts in the skin. The most common types of bacteria that cause cellulitis are Staphylococcus and Streptococcus. SYMPTOMS   Redness and warmth.  Swelling.  Tenderness or pain.  Fever. DIAGNOSIS  Your caregiver can usually determine what is wrong based on a physical exam. Blood tests may also be done. TREATMENT  Treatment usually involves taking an antibiotic medicine. HOME CARE INSTRUCTIONS   Take your antibiotics as directed. Finish them even if you start to feel better.  Keep the infected arm or leg elevated to reduce swelling.  Apply a warm cloth to the affected area up to 4 times per day to relieve pain.  Only take over-the-counter or prescription medicines for pain, discomfort, or fever as directed by your caregiver.  Keep all follow-up appointments as directed by your caregiver. SEEK MEDICAL CARE IF:   You notice red streaks coming from the infected area.  Your red area gets larger or turns dark in color.  Your bone or joint underneath the infected area becomes painful after the skin has healed.  Your infection returns in the same area or another area.  You notice a swollen bump in the infected area.  You develop new symptoms. SEEK IMMEDIATE MEDICAL CARE IF:   You have a fever.  You feel very sleepy.  You develop vomiting or diarrhea.  You have a general ill feeling (malaise) with muscle aches and pains. MAKE SURE YOU:   Understand these instructions.  Will watch your condition.  Will get help right away if you are not doing well or get worse. Document Released: 07/26/2005 Document Revised: 04/16/2012 Document Reviewed: 01/01/2012 [employer] Patient Information 2014 [employer].  

## 2014-04-06 NOTE — Progress Notes (Signed)
Patient given discharge instructions, and verbalized an understanding of all discharge instructions.  Patient agrees with discharge plan, and is being discharged in stable medical condition.  Patient given transportation via wheelchair.  Zak Gondek RN 

## 2014-04-07 DIAGNOSIS — S81009A Unspecified open wound, unspecified knee, initial encounter: Secondary | ICD-10-CM | POA: Diagnosis not present

## 2014-04-07 DIAGNOSIS — S81809A Unspecified open wound, unspecified lower leg, initial encounter: Secondary | ICD-10-CM | POA: Diagnosis not present

## 2014-04-07 DIAGNOSIS — L02419 Cutaneous abscess of limb, unspecified: Secondary | ICD-10-CM | POA: Diagnosis not present

## 2014-04-07 DIAGNOSIS — I129 Hypertensive chronic kidney disease with stage 1 through stage 4 chronic kidney disease, or unspecified chronic kidney disease: Secondary | ICD-10-CM | POA: Diagnosis not present

## 2014-04-07 DIAGNOSIS — W64XXXA Exposure to other animate mechanical forces, initial encounter: Secondary | ICD-10-CM | POA: Diagnosis not present

## 2014-04-07 DIAGNOSIS — N183 Chronic kidney disease, stage 3 unspecified: Secondary | ICD-10-CM | POA: Diagnosis not present

## 2014-04-07 DIAGNOSIS — L03119 Cellulitis of unspecified part of limb: Secondary | ICD-10-CM | POA: Diagnosis not present

## 2014-04-07 DIAGNOSIS — D649 Anemia, unspecified: Secondary | ICD-10-CM | POA: Diagnosis not present

## 2014-04-07 LAB — CULTURE, BLOOD (ROUTINE X 2)
Culture: NO GROWTH
Culture: NO GROWTH

## 2014-04-08 DIAGNOSIS — S81009A Unspecified open wound, unspecified knee, initial encounter: Secondary | ICD-10-CM | POA: Diagnosis not present

## 2014-04-08 DIAGNOSIS — N183 Chronic kidney disease, stage 3 unspecified: Secondary | ICD-10-CM | POA: Diagnosis not present

## 2014-04-08 DIAGNOSIS — W64XXXA Exposure to other animate mechanical forces, initial encounter: Secondary | ICD-10-CM | POA: Diagnosis not present

## 2014-04-08 DIAGNOSIS — S81809A Unspecified open wound, unspecified lower leg, initial encounter: Secondary | ICD-10-CM | POA: Diagnosis not present

## 2014-04-08 DIAGNOSIS — I129 Hypertensive chronic kidney disease with stage 1 through stage 4 chronic kidney disease, or unspecified chronic kidney disease: Secondary | ICD-10-CM | POA: Diagnosis not present

## 2014-04-08 DIAGNOSIS — D649 Anemia, unspecified: Secondary | ICD-10-CM | POA: Diagnosis not present

## 2014-04-08 DIAGNOSIS — L02419 Cutaneous abscess of limb, unspecified: Secondary | ICD-10-CM | POA: Diagnosis not present

## 2014-04-09 ENCOUNTER — Encounter (HOSPITAL_COMMUNITY): Payer: Self-pay | Admitting: Emergency Medicine

## 2014-04-09 ENCOUNTER — Emergency Department (HOSPITAL_COMMUNITY): Payer: Medicare Other

## 2014-04-09 ENCOUNTER — Inpatient Hospital Stay (HOSPITAL_COMMUNITY)
Admission: EM | Admit: 2014-04-09 | Discharge: 2014-04-12 | DRG: 603 | Disposition: A | Discharge status: Home / Self Care | Payer: Medicare Other | Attending: Family Medicine | Admitting: Family Medicine

## 2014-04-09 DIAGNOSIS — S81009A Unspecified open wound, unspecified knee, initial encounter: Secondary | ICD-10-CM | POA: Diagnosis not present

## 2014-04-09 DIAGNOSIS — Z880 Allergy status to penicillin: Secondary | ICD-10-CM | POA: Diagnosis not present

## 2014-04-09 DIAGNOSIS — Z886 Allergy status to analgesic agent status: Secondary | ICD-10-CM | POA: Diagnosis not present

## 2014-04-09 DIAGNOSIS — E785 Hyperlipidemia, unspecified: Secondary | ICD-10-CM | POA: Diagnosis not present

## 2014-04-09 DIAGNOSIS — L03119 Cellulitis of unspecified part of limb: Principal | ICD-10-CM

## 2014-04-09 DIAGNOSIS — Z87891 Personal history of nicotine dependence: Secondary | ICD-10-CM | POA: Diagnosis not present

## 2014-04-09 DIAGNOSIS — D649 Anemia, unspecified: Secondary | ICD-10-CM | POA: Diagnosis not present

## 2014-04-09 DIAGNOSIS — S81809A Unspecified open wound, unspecified lower leg, initial encounter: Secondary | ICD-10-CM | POA: Diagnosis not present

## 2014-04-09 DIAGNOSIS — Z9089 Acquired absence of other organs: Secondary | ICD-10-CM | POA: Diagnosis not present

## 2014-04-09 DIAGNOSIS — Z8249 Family history of ischemic heart disease and other diseases of the circulatory system: Secondary | ICD-10-CM | POA: Diagnosis not present

## 2014-04-09 DIAGNOSIS — I471 Supraventricular tachycardia: Secondary | ICD-10-CM

## 2014-04-09 DIAGNOSIS — L03116 Cellulitis of left lower limb: Secondary | ICD-10-CM

## 2014-04-09 DIAGNOSIS — Z79899 Other long term (current) drug therapy: Secondary | ICD-10-CM | POA: Diagnosis not present

## 2014-04-09 DIAGNOSIS — M259 Joint disorder, unspecified: Secondary | ICD-10-CM | POA: Diagnosis not present

## 2014-04-09 DIAGNOSIS — I491 Atrial premature depolarization: Secondary | ICD-10-CM

## 2014-04-09 DIAGNOSIS — R600 Localized edema: Secondary | ICD-10-CM

## 2014-04-09 DIAGNOSIS — R55 Syncope and collapse: Secondary | ICD-10-CM

## 2014-04-09 DIAGNOSIS — D72829 Elevated white blood cell count, unspecified: Secondary | ICD-10-CM | POA: Diagnosis not present

## 2014-04-09 DIAGNOSIS — E039 Hypothyroidism, unspecified: Secondary | ICD-10-CM

## 2014-04-09 DIAGNOSIS — Z888 Allergy status to other drugs, medicaments and biological substances status: Secondary | ICD-10-CM | POA: Diagnosis not present

## 2014-04-09 DIAGNOSIS — I83893 Varicose veins of bilateral lower extremities with other complications: Secondary | ICD-10-CM

## 2014-04-09 DIAGNOSIS — I10 Essential (primary) hypertension: Secondary | ICD-10-CM | POA: Diagnosis not present

## 2014-04-09 DIAGNOSIS — I359 Nonrheumatic aortic valve disorder, unspecified: Secondary | ICD-10-CM | POA: Diagnosis not present

## 2014-04-09 DIAGNOSIS — I4719 Other supraventricular tachycardia: Secondary | ICD-10-CM

## 2014-04-09 DIAGNOSIS — I129 Hypertensive chronic kidney disease with stage 1 through stage 4 chronic kidney disease, or unspecified chronic kidney disease: Secondary | ICD-10-CM | POA: Diagnosis not present

## 2014-04-09 DIAGNOSIS — L02419 Cutaneous abscess of limb, unspecified: Secondary | ICD-10-CM | POA: Diagnosis not present

## 2014-04-09 DIAGNOSIS — L0390 Cellulitis, unspecified: Secondary | ICD-10-CM

## 2014-04-09 DIAGNOSIS — I350 Nonrheumatic aortic (valve) stenosis: Secondary | ICD-10-CM | POA: Diagnosis present

## 2014-04-09 DIAGNOSIS — W64XXXA Exposure to other animate mechanical forces, initial encounter: Secondary | ICD-10-CM | POA: Diagnosis not present

## 2014-04-09 DIAGNOSIS — M79609 Pain in unspecified limb: Secondary | ICD-10-CM

## 2014-04-09 DIAGNOSIS — N183 Chronic kidney disease, stage 3 unspecified: Secondary | ICD-10-CM | POA: Diagnosis not present

## 2014-04-09 LAB — CBC
HCT: 36.8 % (ref 36.0–46.0)
Hemoglobin: 12 g/dL (ref 12.0–15.0)
MCH: 29.9 pg (ref 26.0–34.0)
MCHC: 32.6 g/dL (ref 30.0–36.0)
MCV: 91.5 fL (ref 78.0–100.0)
Platelets: 386 10*3/uL (ref 150–400)
RBC: 4.02 MIL/uL (ref 3.87–5.11)
RDW: 13.1 % (ref 11.5–15.5)
WBC: 12.8 10*3/uL — ABNORMAL HIGH (ref 4.0–10.5)

## 2014-04-09 LAB — BASIC METABOLIC PANEL
BUN: 23 mg/dL (ref 6–23)
CO2: 25 mEq/L (ref 19–32)
Calcium: 9.4 mg/dL (ref 8.4–10.5)
Chloride: 97 mEq/L (ref 96–112)
Creatinine, Ser: 1.21 mg/dL — ABNORMAL HIGH (ref 0.50–1.10)
GFR calc Af Amer: 46 mL/min — ABNORMAL LOW (ref >90)
GFR calc non Af Amer: 40 mL/min — ABNORMAL LOW (ref >90)
Glucose, Bld: 115 mg/dL — ABNORMAL HIGH (ref 70–99)
Potassium: 4.9 mEq/L (ref 3.7–5.3)
Sodium: 134 mEq/L — ABNORMAL LOW (ref 137–147)

## 2014-04-09 MED ORDER — DICYCLOMINE HCL 20 MG PO TABS
20.0000 mg | ORAL_TABLET | Freq: Four times a day (QID) | ORAL | Status: DC | PRN
  Administered 2014-04-10 – 2014-04-12 (×3): 20 mg via ORAL
  Filled 2014-04-09 (×4): qty 1

## 2014-04-09 MED ORDER — SODIUM CHLORIDE 0.9 % IJ SOLN
3.0000 mL | Freq: Two times a day (BID) | INTRAMUSCULAR | Status: DC
  Administered 2014-04-09 – 2014-04-12 (×6): 3 mL via INTRAVENOUS

## 2014-04-09 MED ORDER — EZETIMIBE-SIMVASTATIN 10-40 MG PO TABS
1.0000 | ORAL_TABLET | Freq: Every day | ORAL | Status: DC
  Administered 2014-04-09 – 2014-04-11 (×3): 1 via ORAL
  Filled 2014-04-09 (×4): qty 1

## 2014-04-09 MED ORDER — ENOXAPARIN SODIUM 40 MG/0.4ML ~~LOC~~ SOLN
40.0000 mg | SUBCUTANEOUS | Status: DC
  Administered 2014-04-09: 40 mg via SUBCUTANEOUS
  Filled 2014-04-09 (×2): qty 0.4

## 2014-04-09 MED ORDER — SODIUM CHLORIDE 0.9 % IJ SOLN: 3.0000 mL | INTRAMUSCULAR | Status: DC | PRN

## 2014-04-09 MED ORDER — DICLOFENAC SODIUM 1 % TD GEL
2.0000 g | Freq: Four times a day (QID) | TRANSDERMAL | Status: DC | PRN
  Filled 2014-04-09: qty 100

## 2014-04-09 MED ORDER — SODIUM CHLORIDE 0.9 % IV SOLN
250.0000 mg | INTRAVENOUS | Status: AC
  Administered 2014-04-09: 250 mg via INTRAVENOUS
  Filled 2014-04-09: qty 250

## 2014-04-09 MED ORDER — IRBESARTAN 300 MG PO TABS
300.0000 mg | ORAL_TABLET | Freq: Every day | ORAL | Status: DC
  Administered 2014-04-10 – 2014-04-12 (×3): 300 mg via ORAL
  Filled 2014-04-09 (×3): qty 1

## 2014-04-09 MED ORDER — FUROSEMIDE 20 MG PO TABS
10.0000 mg | ORAL_TABLET | Freq: Every day | ORAL | Status: DC
  Administered 2014-04-10 – 2014-04-11 (×2): 10 mg via ORAL
  Administered 2014-04-12: 09:00:00 via ORAL
  Filled 2014-04-09 (×3): qty 0.5

## 2014-04-09 MED ORDER — HYDROCODONE-ACETAMINOPHEN 5-325 MG PO TABS: 1.0000 | ORAL_TABLET | ORAL | Status: DC | PRN

## 2014-04-09 MED ORDER — CALCIUM CARBONATE 600 MG PO TABS: 1800.0000 mg | ORAL_TABLET | Freq: Every day | ORAL | Status: DC

## 2014-04-09 MED ORDER — VANCOMYCIN HCL IN DEXTROSE 1-5 GM/200ML-% IV SOLN
1000.0000 mg | Freq: Once | INTRAVENOUS | Status: AC
  Administered 2014-04-09: 1000 mg via INTRAVENOUS
  Filled 2014-04-09: qty 200

## 2014-04-09 MED ORDER — LEVOTHYROXINE SODIUM 75 MCG PO TABS
75.0000 ug | ORAL_TABLET | Freq: Every day | ORAL | Status: DC
  Administered 2014-04-10 – 2014-04-12 (×3): 75 ug via ORAL
  Filled 2014-04-09 (×4): qty 1

## 2014-04-09 MED ORDER — ONDANSETRON HCL 4 MG/2ML IJ SOLN: 4.0000 mg | Freq: Four times a day (QID) | INTRAMUSCULAR | Status: DC | PRN

## 2014-04-09 MED ORDER — ACETAMINOPHEN 500 MG PO TABS: 500.0000 mg | ORAL_TABLET | Freq: Four times a day (QID) | ORAL | Status: DC | PRN

## 2014-04-09 MED ORDER — OMEGA-3-ACID ETHYL ESTERS 1 G PO CAPS
1.0000 g | ORAL_CAPSULE | Freq: Every day | ORAL | Status: DC
  Administered 2014-04-09 – 2014-04-12 (×4): 1 g via ORAL
  Filled 2014-04-09 (×4): qty 1

## 2014-04-09 MED ORDER — ONDANSETRON HCL 4 MG PO TABS: 4.0000 mg | ORAL_TABLET | Freq: Four times a day (QID) | ORAL | Status: DC | PRN

## 2014-04-09 MED ORDER — SODIUM CHLORIDE 0.9 % IV SOLN
250.0000 mg | Freq: Four times a day (QID) | INTRAVENOUS | Status: DC
  Administered 2014-04-10 (×3): 250 mg via INTRAVENOUS
  Filled 2014-04-09 (×4): qty 250

## 2014-04-09 MED ORDER — CALCIUM CARBONATE 1250 (500 CA) MG PO TABS
1.0000 | ORAL_TABLET | Freq: Three times a day (TID) | ORAL | Status: DC
  Administered 2014-04-10 – 2014-04-12 (×8): 500 mg via ORAL
  Filled 2014-04-09 (×10): qty 1

## 2014-04-09 MED ORDER — OLOPATADINE HCL 0.1 % OP SOLN
1.0000 [drp] | Freq: Two times a day (BID) | OPHTHALMIC | Status: DC
  Administered 2014-04-09 – 2014-04-12 (×6): 1 [drp] via OPHTHALMIC
  Filled 2014-04-09: qty 5

## 2014-04-09 MED ORDER — VANCOMYCIN HCL IN DEXTROSE 750-5 MG/150ML-% IV SOLN: 750.0000 mg | INTRAVENOUS | Status: DC

## 2014-04-09 MED ORDER — SODIUM CHLORIDE 0.9 % IV SOLN: 250.0000 mL | INTRAVENOUS | Status: DC | PRN

## 2014-04-09 MED ORDER — ESTROGENS CONJUGATED 0.625 MG PO TABS
0.6250 mg | ORAL_TABLET | Freq: Every day | ORAL | Status: DC
  Filled 2014-04-09: qty 1

## 2014-04-09 MED ORDER — FERROUS SULFATE 325 (65 FE) MG PO TABS
325.0000 mg | ORAL_TABLET | Freq: Two times a day (BID) | ORAL | Status: DC
  Administered 2014-04-10 (×2): 325 mg via ORAL
  Filled 2014-04-09 (×6): qty 1

## 2014-04-09 MED ORDER — MUPIROCIN CALCIUM 2 % EX CREA
TOPICAL_CREAM | Freq: Two times a day (BID) | CUTANEOUS | Status: DC
  Administered 2014-04-09 – 2014-04-12 (×6): via TOPICAL
  Filled 2014-04-09: qty 15

## 2014-04-09 NOTE — Progress Notes (Signed)
ANTIBIOTIC CONSULT NOTE - INITIAL  Pharmacy Consult for Vancomycin and Primaxin Indication: Cellulitis  Allergies  Allergen Reactions  . Aspirin Nausea And Vomiting  . Codeine Nausea And Vomiting    "Deathly Sick"  . Coreg [Carvedilol] Other (See Comments)    Fatigue/ ill  . Penicillins Rash    Patient Measurements:   As of 04/01/14 70.5 kg 60 in  Vital Signs: Temp: 99.2 F (37.3 C) (06/11 1348) Temp src: Oral (06/11 1348) BP: 126/69 mmHg (06/11 1731) Pulse Rate: 104 (06/11 1731) Intake/Output from previous day:   Intake/Output from this shift:    Labs:  Recent Labs  04/09/14 1647  WBC 12.8*  HGB 12.0  PLT 386  CREATININE 1.21*   The CrCl is unknown because both a height and weight (above a minimum accepted value) are required for this calculation. 38 ml/min/1.77m2 (normalized) No results found for this basename: VANCOTROUGH, VANCOPEAK, VANCORANDOM, GENTTROUGH, GENTPEAK, GENTRANDOM, TOBRATROUGH, TOBRAPEAK, TOBRARND, AMIKACINPEAK, AMIKACINTROU, AMIKACIN,  in the last 72 hours   Microbiology: Recent Results (from the past 720 hour(s))  CULTURE, BLOOD (ROUTINE X 2)     Status: None   Collection Time    04/01/14  1:35 PM      Result Value Ref Range Status   Specimen Description BLOOD LEFT ANTECUBITAL   Final   Special Requests BOTTLES DRAWN AEROBIC AND ANAEROBIC 5ML   Final   Culture  Setup Time     Final   Value: 04/01/2014 17:17     Performed at [facility]   Culture     Final   Value: NO GROWTH 5 DAYS     Performed at [facility]   Report Status 04/07/2014 FINAL   Final  CULTURE, BLOOD (ROUTINE X 2)     Status: None   Collection Time    04/01/14  1:35 PM      Result Value Ref Range Status   Specimen Description BLOOD RIGHT ANTECUBITAL   Final   Special Requests BOTTLES DRAWN AEROBIC AND ANAEROBIC 2ML   Final   Culture  Setup Time     Final   Value: 04/01/2014 17:17     Performed at [facility]     Final   Value:  NO GROWTH 5 DAYS     Performed at [facility]   Report Status 04/07/2014 FINAL   Final    Medical History: Past Medical History  Diagnosis Date  . HTN (hypertension)   . HLD (hyperlipidemia)   . Hypothyroidism   . Aortic stenosis, moderate     last echo in 2010; AVA 1.1; mild AS per echo in June 2013  . Edema of both legs     s/p laser treatment per Dr. Donnetta Hutching    Medications:  Scheduled:  Infusions:  PRN:    Anti-infectives: Clinda PTA 6/11 >> Vanc >> 6/11 >> Primaxin >>  Labs / Vitals: Tmax: 99.2 WBC: 12.8k Renal: SCr 1.21 (appears baseline), CrCl 38 ml/min/1.7m2 (normalized)  Micro: 6/03 blood x2: NGF 6/11 blood x2: sent  Assessment: 80 yo female recently admitted for cellulitis of LLE, discharged on clindamycin. Presents to ED 6/11 with worsening pain and redness in the same area. Pharmacy consulted to dose Vancomycin and Primaxin (PCN allergy) for cellulitis.  Goal of Therapy:  Vancomycin trough level 10-15 mcg/ml Primaxin dosing per weight and renal function  Plan:   Vancomycin 1g IV given in ED, then 750mg  IV q24h Check trough at steady state  Primaxin 250mg  IV q6h  F/u renal function, cultures, clinical course  Peggyann Juba, PharmD, BCPS Pager: [phone] 04/09/2014,6:40 PM

## 2014-04-09 NOTE — ED Notes (Signed)
Attempted to call report, floor nurse not ready Call back number left

## 2014-04-09 NOTE — ED Notes (Signed)
Pt reports recent d/c from hospital for staff infection to left lower that is now getting worse with increase redness and pain. Pt told to return immediately. Pt able ambulate with pain. Pt in NAD. Pt had fever of 99.5 yesterday at home.

## 2014-04-09 NOTE — ED Provider Notes (Signed)
CSN: [id number]     Arrival date & time 04/09/14  1326 History   First MD Initiated Contact with Patient 04/09/14 1616     Chief Complaint  Patient presents with  . leg infection      (Consider location/radiation/quality/duration/timing/severity/associated sxs/prior Treatment) HPI Pt presenting with c/o worsening pain and redness in left lower extremity.  Pt was recently discharged from hospitlal for cellulitis of this same area.  No fever/chills, but today had temp of 99.2.  She has been taking clindamycin and followed by home health.  Today the home health nurse advised coming to the ED due to increased redness and pain. Pt states she was up all night with pain last night.  No vomiting.  Pain worse with palpation.  There are no other associated systemic symptoms, there are no other alleviating or modifying factors.   Past Medical History  Diagnosis Date  . HTN (hypertension)   . HLD (hyperlipidemia)   . Hypothyroidism   . Aortic stenosis, moderate     last echo in 2010; AVA 1.1; mild AS per echo in June 2013  . Edema of both legs     s/p laser treatment per Dr. Donnetta Hutching   Past Surgical History  Procedure Laterality Date  . Appendectomy  1955  . Foot surgery  2003    bilateral Hammer Toe  . Eye surgery  2005    Bilateral cataract  . Endovenous ablation saphenous vein w/ laser  08-29-2012    left greater saphenous vein   Curt Jews MD  . Endovenous ablation saphenous vein w/ laser  09-19-2012    right greater saphenous vein by Curt Jews MD  . Stab phlebectomy Right 01-02-2013    10-15 incisions right thigh and calf by Curt Jews MD   Family History  Problem Relation Age of Onset  . Coronary artery disease    . Peripheral vascular disease    . Heart attack    . Heart disease Mother   . Peripheral vascular disease Mother   . Heart disease Father   . Peripheral vascular disease Father     Right leg amputation  . Hypertension Sister   . Heart disease Brother 12    Heart  Disease before age 63   History  Substance Use Topics  . Smoking status: Former Smoker    Types: Cigarettes    Quit date: 10/31/1983  . Smokeless tobacco: Never Used  . Alcohol Use: No   OB History   Grav Para Term Preterm Abortions TAB SAB Ect Mult Living                 Review of Systems ROS reviewed and all otherwise negative except for mentioned in HPI    Allergies  Aspirin; Codeine; Coreg; and Penicillins  Home Medications   Prior to Admission medications   Medication Sig Start Date End Date Taking? Authorizing Provider  acetaminophen (TYLENOL) 500 MG tablet Take 500 mg by mouth every 6 (six) hours as needed (pain).   Yes Historical Provider, MD  calcium carbonate (OS-CAL) 600 MG TABS Take 1,800 mg by mouth daily.   Yes Historical Provider, MD  clindamycin (CLEOCIN) 300 MG capsule Take 1 capsule (300 mg total) by mouth 4 (four) times daily. 04/06/14  Yes Maryann Mikhail, DO  diclofenac sodium (VOLTAREN) 1 % GEL Apply 2 g topically 4 (four) times daily as needed (For pain).   Yes Historical Provider, MD  dicyclomine (BENTYL) 20 MG tablet Take 20 mg by mouth  every 6 (six) hours as needed. For stomach irritability.   Yes Historical Provider, MD  estrogens, conjugated, (PREMARIN) 0.625 MG tablet Take 0.625 mg by mouth daily. Take daily for 21 days then do not take for 7 days.   Yes Historical Provider, MD  ezetimibe-simvastatin (VYTORIN) 10-40 MG per tablet Take 1 tablet by mouth at bedtime.   Yes Historical Provider, MD  furosemide (LASIX) 20 MG tablet Take 10 mg by mouth daily.    Yes Historical Provider, MD  levothyroxine (SYNTHROID, LEVOTHROID) 75 MCG tablet Take 75 mcg by mouth daily.     Yes Historical Provider, MD  medroxyPROGESTERone (PROVERA) 2.5 MG tablet Take 5 mg by mouth daily. For 10 days each month   Yes Historical Provider, MD  mupirocin cream (BACTROBAN) 2 % Apply topically 2 (two) times daily. 04/06/14  Yes Maryann Mikhail, DO  olmesartan (BENICAR) 40 MG tablet  Take 40 mg by mouth daily.   Yes Historical Provider, MD  olopatadine (PATANOL) 0.1 % ophthalmic solution Place 1 drop into both eyes 2 (two) times daily.   Yes Historical Provider, MD  Omega-3 Fatty Acids (FISH OIL) 1000 MG CAPS Take 1 capsule by mouth 3 (three) times daily.   Yes Historical Provider, MD  ferrous sulfate 325 (65 FE) MG tablet Take 1 tablet (325 mg total) by mouth 2 (two) times daily with a meal. 04/06/14   Maryann Mikhail, DO   BP 126/69  Pulse 104  Temp(Src) 99.2 F (37.3 C) (Oral)  Resp 19  SpO2 96% Vitals reviewed Physical Exam Physical Examination: General appearance - alert, well appearing, and in no distress Mental status - alert, oriented to person, place, and time Eyes - no conjunctival injection, no scleral icterus Mouth - mucous membranes moist, pharynx normal without lesions Chest - clear to auscultation, no wheezes, rales or rhonchi, symmetric air entry Heart - normal rate, regular rhythm, normal S1, S2, no murmurs, rubs, clicks or gallops Abdomen - soft, nontender, nondistended, no masses or organomegaly Extremities - peripheral pulses normal, no pedal edema, no clubbing or cyanosis Skin - normal coloration and turgor, no rashes  ED Course  Procedures (including critical care time) Labs Review Labs Reviewed  CBC - Abnormal; Notable for the following:    WBC 12.8 (*)    All other components within normal limits  BASIC METABOLIC PANEL - Abnormal; Notable for the following:    Sodium 134 (*)    Glucose, Bld 115 (*)    Creatinine, Ser 1.21 (*)    GFR calc non Af Amer 40 (*)    GFR calc Af Amer 46 (*)    All other components within normal limits  CULTURE, BLOOD (ROUTINE X 2)  CULTURE, BLOOD (ROUTINE X 2)    Imaging Review Dg Tibia/fibula Left  04/09/2014   CLINICAL DATA:  Clinical infected region in the medial aspect of the lower leg following animal scratch  EXAM: LEFT TIBIA AND FIBULA - 2 VIEW  COMPARISON:  Tibia and fibula film of March 3rd 2015   FINDINGS: The bones are adequately mineralized. There is no lytic or blastic lesion or periosteal reaction. There is subtle irregularity of the skin surface over the medial aspect of the distal leg and there is a small amount of soft tissue swelling. There is no soft tissue gas or foreign body.  IMPRESSION: 1. There is no plain film evidence of osteomyelitis. 2. Subtle changes in the soft tissues over the distal leg medially may reflect clinically known cellulitis.  Electronically Signed   By: David  Martinique   On: 04/09/2014 17:35     EKG Interpretation None      MDM   Final diagnoses:  Cellulitis of left lower extremity  Leukocytosis    Pt presenting with c/o worsening redness and pain in left lower extremity- has been hospitalized and was discharged 6/8 taking oral clinda.  Mild leukocytosis, low grade fever.  Started on vancomycin here in the ED.  Pt declined taking anything for pain at this time.  Pt [facility].      Threasa Beards, MD 04/09/14 [phone]

## 2014-04-09 NOTE — ED Notes (Signed)
Assumed care of patient Floor bed has been assigned, but day shift RN unable to give report to floor Will attempt to call report to floor

## 2014-04-09 NOTE — H&P (Signed)
[facility] History and Physical  [name] MOL:[id number] DOB: 15-Sep-1927 DOA: 04/09/2014  Referring physician: Dr Canary Brim.  PCP: Rachell Cipro, MD   Chief Complaint: worsening left LE redness and swelling.   HPI: [name] is a 80 y.o. female with PMH significant for aortic stenosis, HLD, HTN who was recently admitted to hospital on 6-3 for failing outpatient treatment for left LE cellulitis with Augmentin. Last admission she was treated with IV clindamycin, redness and swelling improved. She was discharge on oral clindamycin. Patient relates that swelling and redness was almost gone when 2 days prior to admission, she started to have worsening redness and swelling of her LE extremity. She has been taking clindamycin. She relates that everything started after her dog scratch her.  She denies nausea, vomiting, chills.   Review of Systems:  Negative, except as per HPI.    Past Medical History  Diagnosis Date  . HTN (hypertension)   . HLD (hyperlipidemia)   . Hypothyroidism   . Aortic stenosis, moderate     last echo in 2010; AVA 1.1; mild AS per echo in June 2013  . Edema of both legs     s/p laser treatment per Dr. Donnetta Hutching   Past Surgical History  Procedure Laterality Date  . Appendectomy  1955  . Foot surgery  2003    bilateral Hammer Toe  . Eye surgery  2005    Bilateral cataract  . Endovenous ablation saphenous vein w/ laser  08-29-2012    left greater saphenous vein   Curt Jews MD  . Endovenous ablation saphenous vein w/ laser  09-19-2012    right greater saphenous vein by Curt Jews MD  . Stab phlebectomy Right 01-02-2013    10-15 incisions right thigh and calf by Curt Jews MD   Social History:  reports that she quit smoking about 30 years ago. Her smoking use included Cigarettes. She smoked 0.00 packs per day. She has never used smokeless tobacco. She reports that she does not drink alcohol or use illicit drugs.  Allergies  Allergen  Reactions  . Aspirin Nausea And Vomiting  . Codeine Nausea And Vomiting    "Deathly Sick"  . Coreg [Carvedilol] Other (See Comments)    Fatigue/ ill  . Penicillins Rash    Family History  Problem Relation Age of Onset  . Coronary artery disease    . Peripheral vascular disease    . Heart attack    . Heart disease Mother   . Peripheral vascular disease Mother   . Heart disease Father   . Peripheral vascular disease Father     Right leg amputation  . Hypertension Sister   . Heart disease Brother 103    Heart Disease before age 38     Prior to Admission medications   Medication Sig Start Date End Date Taking? Authorizing Provider  acetaminophen (TYLENOL) 500 MG tablet Take 500 mg by mouth every 6 (six) hours as needed (pain).   Yes Historical Provider, MD  calcium carbonate (OS-CAL) 600 MG TABS Take 1,800 mg by mouth daily.   Yes Historical Provider, MD  clindamycin (CLEOCIN) 300 MG capsule Take 1 capsule (300 mg total) by mouth 4 (four) times daily. 04/06/14  Yes Maryann Mikhail, DO  diclofenac sodium (VOLTAREN) 1 % GEL Apply 2 g topically 4 (four) times daily as needed (For pain).   Yes Historical Provider, MD  dicyclomine (BENTYL) 20 MG tablet Take 20 mg by mouth every 6 (six) hours as needed. For  stomach irritability.   Yes Historical Provider, MD  estrogens, conjugated, (PREMARIN) 0.625 MG tablet Take 0.625 mg by mouth daily. Take daily for 21 days then do not take for 7 days.   Yes Historical Provider, MD  ezetimibe-simvastatin (VYTORIN) 10-40 MG per tablet Take 1 tablet by mouth at bedtime.   Yes Historical Provider, MD  furosemide (LASIX) 20 MG tablet Take 10 mg by mouth daily.    Yes Historical Provider, MD  levothyroxine (SYNTHROID, LEVOTHROID) 75 MCG tablet Take 75 mcg by mouth daily.     Yes Historical Provider, MD  medroxyPROGESTERone (PROVERA) 2.5 MG tablet Take 5 mg by mouth daily. For 10 days each month   Yes Historical Provider, MD  mupirocin cream (BACTROBAN) 2 % Apply  topically 2 (two) times daily. 04/06/14  Yes Maryann Mikhail, DO  olmesartan (BENICAR) 40 MG tablet Take 40 mg by mouth daily.   Yes Historical Provider, MD  olopatadine (PATANOL) 0.1 % ophthalmic solution Place 1 drop into both eyes 2 (two) times daily.   Yes Historical Provider, MD  Omega-3 Fatty Acids (FISH OIL) 1000 MG CAPS Take 1 capsule by mouth 3 (three) times daily.   Yes Historical Provider, MD  ferrous sulfate 325 (65 FE) MG tablet Take 1 tablet (325 mg total) by mouth 2 (two) times daily with a meal. 04/06/14   Cristal Ford, DO   Physical Exam: Filed Vitals:   04/09/14 1954  BP: 149/95  Pulse: 120  Temp: 98.1 F (36.7 C)  Resp: 18    BP 149/95  Pulse 120  Temp(Src) 98.1 F (36.7 C) (Oral)  Resp 18  Ht 5' (1.524 m)  Wt 70.308 kg (155 lb)  BMI 30.27 kg/m2  SpO2 100%  General:  Appears calm and comfortable Eyes: PERRL, normal lids, irises & conjunctiva ENT: grossly normal hearing, lips & tongue Neck: no LAD, masses or thyromegaly Cardiovascular: RRR, no m/r/g.  Respiratory: CTA bilaterally, no w/r/r. Normal respiratory effort. Abdomen: soft, ntnd Skin: Left LE with edema, erythema, she has multiple small hyperpigmentation in one area.  Musculoskeletal: grossly normal tone BUE/BLE Psychiatric: grossly normal mood and affect, speech fluent and appropriate Neurologic: grossly non-focal.          Labs on Admission:  Basic Metabolic Panel:  Recent Labs Lab 04/03/14 0500 04/04/14 0603 04/05/14 0553 04/06/14 0525 04/09/14 1647  NA 142 141 137 139 134*  K 4.0 3.8 4.0 4.0 4.9  CL 103 100 98 100 97  CO2 27 28 24 26 25   GLUCOSE 104* 100* 109* 108* 115*  BUN 19 19 18 20 23   CREATININE 1.20* 1.19* 1.15* 1.15* 1.21*  CALCIUM 9.0 8.8 9.1 9.1 9.4  MG 1.7 1.7 1.7 1.8  --    Liver Function Tests:  Recent Labs Lab 04/03/14 0500 04/04/14 0603 04/05/14 0553 04/06/14 0525  AST 15 19 19 20   ALT 6 7 8 8   ALKPHOS 50 58 59 60  BILITOT 0.3 0.4 0.4 0.4  PROT 5.9*  6.6 6.5 6.5  ALBUMIN 2.5* 2.7* 2.8* 2.7*   No results found for this basename: LIPASE, AMYLASE,  in the last 168 hours No results found for this basename: AMMONIA,  in the last 168 hours CBC:  Recent Labs Lab 04/03/14 0500 04/04/14 0603 04/05/14 0553 04/06/14 0525 04/09/14 1647  WBC 8.8 8.3 9.8 10.6* 12.8*  NEUTROABS 5.6 5.3 6.2 7.3  --   HGB 11.2* 11.5* 11.7* 11.9* 12.0  HCT 34.1* 35.8* 35.1* 36.6 36.8  MCV 92.9 92.3 91.4  92.2 91.5  PLT 367 385 356 332 386   Cardiac Enzymes: No results found for this basename: CKTOTAL, CKMB, CKMBINDEX, TROPONINI,  in the last 168 hours  BNP (last 3 results) No results found for this basename: PROBNP,  in the last 8760 hours CBG: No results found for this basename: GLUCAP,  in the last 168 hours  Radiological Exams on Admission: Dg Tibia/fibula Left  04/09/2014   CLINICAL DATA:  Clinical infected region in the medial aspect of the lower leg following animal scratch  EXAM: LEFT TIBIA AND FIBULA - 2 VIEW  COMPARISON:  Tibia and fibula film of March 3rd 2015  FINDINGS: The bones are adequately mineralized. There is no lytic or blastic lesion or periosteal reaction. There is subtle irregularity of the skin surface over the medial aspect of the distal leg and there is a small amount of soft tissue swelling. There is no soft tissue gas or foreign body.  IMPRESSION: 1. There is no plain film evidence of osteomyelitis. 2. Subtle changes in the soft tissues over the distal leg medially may reflect clinically known cellulitis.   Electronically Signed   By: David  Martinique   On: 04/09/2014 17:35     Assessment/Plan Active Problems:   Aortic stenosis   HTN (hypertension)   Cellulitis   HLD (hyperlipidemia)   1-Recurrent left LE cellulitis; Patient now failed clindamycin. Will start IV vancomycin and Primaxin. Will order wound care consult. Might need ID evaluation.  2-Aortic stenosis; continue with lasix.  3-HLD; continue with Vytorin.    4-Hypothyroidism; continue with synthroid.  5-HTN; continue with ACE. Monitor renal function.   Code Status: Full Code.  Family Communication: Care discussed with patient.  Disposition Plan: expect 3 to 4 days.   Time spent: 55 minutes  Niel Hummer A [facility] Pager [phone]  **Disclaimer: This note may have been dictated with voice recognition software. Similar sounding words can inadvertently be transcribed and this note may contain transcription errors which may not have been corrected upon publication of note.**

## 2014-04-09 NOTE — ED Notes (Signed)
Attempted to call report. Receiving nurse to call when available for report.

## 2014-04-10 LAB — CBC
HCT: 34.6 % — ABNORMAL LOW (ref 36.0–46.0)
Hemoglobin: 11.3 g/dL — ABNORMAL LOW (ref 12.0–15.0)
MCH: 30.1 pg (ref 26.0–34.0)
MCHC: 32.7 g/dL (ref 30.0–36.0)
MCV: 92 fL (ref 78.0–100.0)
Platelets: 380 10*3/uL (ref 150–400)
RBC: 3.76 MIL/uL — ABNORMAL LOW (ref 3.87–5.11)
RDW: 13 % (ref 11.5–15.5)
WBC: 10.1 10*3/uL (ref 4.0–10.5)

## 2014-04-10 LAB — BASIC METABOLIC PANEL
BUN: 20 mg/dL (ref 6–23)
CO2: 26 mEq/L (ref 19–32)
Calcium: 9.1 mg/dL (ref 8.4–10.5)
Chloride: 100 mEq/L (ref 96–112)
Creatinine, Ser: 1.23 mg/dL — ABNORMAL HIGH (ref 0.50–1.10)
GFR calc Af Amer: 45 mL/min — ABNORMAL LOW (ref >90)
GFR calc non Af Amer: 39 mL/min — ABNORMAL LOW (ref >90)
Glucose, Bld: 119 mg/dL — ABNORMAL HIGH (ref 70–99)
Potassium: 4.6 mEq/L (ref 3.7–5.3)
Sodium: 138 mEq/L (ref 137–147)

## 2014-04-10 MED ORDER — BOOST / RESOURCE BREEZE PO LIQD
1.0000 | Freq: Two times a day (BID) | ORAL | Status: DC
  Administered 2014-04-10 – 2014-04-12 (×4): 1 via ORAL

## 2014-04-10 MED ORDER — ENOXAPARIN SODIUM 30 MG/0.3ML ~~LOC~~ SOLN
30.0000 mg | SUBCUTANEOUS | Status: DC
  Administered 2014-04-10 – 2014-04-11 (×2): 30 mg via SUBCUTANEOUS
  Filled 2014-04-10 (×3): qty 0.3

## 2014-04-10 MED ORDER — ESTROGENS CONJUGATED 0.625 MG PO TABS
0.6250 mg | ORAL_TABLET | Freq: Every day | ORAL | Status: DC
  Administered 2014-04-10 – 2014-04-12 (×3): 0.625 mg via ORAL
  Filled 2014-04-10 (×3): qty 1

## 2014-04-10 MED ORDER — DOXYCYCLINE HYCLATE 100 MG PO TABS
100.0000 mg | ORAL_TABLET | Freq: Two times a day (BID) | ORAL | Status: DC
  Administered 2014-04-10 – 2014-04-12 (×4): 100 mg via ORAL
  Filled 2014-04-10 (×6): qty 1

## 2014-04-10 NOTE — Progress Notes (Signed)
[facility] PROGRESS NOTE  [name] WVP:[id number] DOB: 1926/11/07 DOA: 04/09/2014 PCP: Rachell Cipro, MD  Assessment/Plan: Principal problem: Cellulitis -Will discontinue IV antibiotics and place on doxycycline. - X-ray shows no underlying osteomyelitis  Active Problems:   Aortic stenosis - stable, continue current managent    HTN (hypertension) - Plan will be to continue Avapro    HLD (hyperlipidemia) - Stable, continue Vytorin  Code Status: full Family Communication: None at bedside Disposition Plan: Pending improvement in condition   Consultants:  None  Procedures:  none  Antibiotics:  Doxycycline  HPI/Subjective: Pt has no new complaints. No acute issues reported overnight.  Objective: Filed Vitals:   04/10/14 1351  BP: 146/87  Pulse: 124  Temp: 97.8 F (36.6 C)  Resp: 18    Intake/Output Summary (Last 24 hours) at 04/10/14 1434 Last data filed at 04/10/14 0400  Gross per 24 hour  Intake    100 ml  Output      0 ml  Net    100 ml   Filed Weights   04/09/14 1954 04/10/14 0546  Weight: 70.308 kg (155 lb) 70.2 kg (154 lb 12.2 oz)    Exam:   General:  Pt in NAD, alert and awake  Cardiovascular: RRR, no MRG  Respiratory: CTA BL, no wheezes  Abdomen: soft, NT, ND  Musculoskeletal: no cyanosis or clubbing  Skin: LLE cellulitis at anterior left leg, no induration or fluctuance   Data Reviewed: Basic Metabolic Panel:  Recent Labs Lab 04/04/14 0603 04/05/14 0553 04/06/14 0525 04/09/14 1647 04/10/14 0425  NA 141 137 139 134* 138  K 3.8 4.0 4.0 4.9 4.6  CL 100 98 100 97 100  CO2 28 24 26 25 26   GLUCOSE 100* 109* 108* 115* 119*  BUN 19 18 20 23 20   CREATININE 1.19* 1.15* 1.15* 1.21* 1.23*  CALCIUM 8.8 9.1 9.1 9.4 9.1  MG 1.7 1.7 1.8  --   --    Liver Function Tests:  Recent Labs Lab 04/04/14 0603 04/05/14 0553 04/06/14 0525  AST 19 19 20   ALT 7 8 8   ALKPHOS 58 59 60  BILITOT 0.4 0.4 0.4  PROT 6.6  6.5 6.5  ALBUMIN 2.7* 2.8* 2.7*   No results found for this basename: LIPASE, AMYLASE,  in the last 168 hours No results found for this basename: AMMONIA,  in the last 168 hours CBC:  Recent Labs Lab 04/04/14 0603 04/05/14 0553 04/06/14 0525 04/09/14 1647 04/10/14 0425  WBC 8.3 9.8 10.6* 12.8* 10.1  NEUTROABS 5.3 6.2 7.3  --   --   HGB 11.5* 11.7* 11.9* 12.0 11.3*  HCT 35.8* 35.1* 36.6 36.8 34.6*  MCV 92.3 91.4 92.2 91.5 92.0  PLT 385 356 332 386 380   Cardiac Enzymes: No results found for this basename: CKTOTAL, CKMB, CKMBINDEX, TROPONINI,  in the last 168 hours BNP (last 3 results) No results found for this basename: PROBNP,  in the last 8760 hours CBG: No results found for this basename: GLUCAP,  in the last 168 hours  Recent Results (from the past 240 hour(s))  CULTURE, BLOOD (ROUTINE X 2)     Status: None   Collection Time    04/01/14  1:35 PM      Result Value Ref Range Status   Specimen Description BLOOD LEFT ANTECUBITAL   Final   Special Requests BOTTLES DRAWN AEROBIC AND ANAEROBIC 5ML   Final   Culture  Setup Time     Final   Value: 04/01/2014  17:17     Performed at [facility]     Final   Value: NO GROWTH 5 DAYS     Performed at [facility]   Report Status 04/07/2014 FINAL   Final  CULTURE, BLOOD (ROUTINE X 2)     Status: None   Collection Time    04/01/14  1:35 PM      Result Value Ref Range Status   Specimen Description BLOOD RIGHT ANTECUBITAL   Final   Special Requests BOTTLES DRAWN AEROBIC AND ANAEROBIC 2ML   Final   Culture  Setup Time     Final   Value: 04/01/2014 17:17     Performed at [facility]   Culture     Final   Value: NO GROWTH 5 DAYS     Performed at [facility]   Report Status 04/07/2014 FINAL   Final  CULTURE, BLOOD (ROUTINE X 2)     Status: None   Collection Time    04/09/14  5:00 PM      Result Value Ref Range Status   Specimen Description BLOOD LEFT ANTECUBITAL   Final   Special  Requests BOTTLES DRAWN AEROBIC AND ANAEROBIC 5CC   Final   Culture  Setup Time     Final   Value: 04/09/2014 22:02     Performed at [facility]   Culture     Final   Value:        BLOOD CULTURE RECEIVED NO GROWTH TO DATE CULTURE WILL BE HELD FOR 5 DAYS BEFORE ISSUING A FINAL NEGATIVE REPORT     Performed at [facility]   Report Status PENDING   Incomplete  CULTURE, BLOOD (ROUTINE X 2)     Status: None   Collection Time    04/09/14  5:00 PM      Result Value Ref Range Status   Specimen Description BLOOD RIGHT HAND   Final   Special Requests BOTTLES DRAWN AEROBIC AND ANAEROBIC 4CC   Final   Culture  Setup Time     Final   Value: 04/09/2014 22:02     Performed at [facility]   Culture     Final   Value:        BLOOD CULTURE RECEIVED NO GROWTH TO DATE CULTURE WILL BE HELD FOR 5 DAYS BEFORE ISSUING A FINAL NEGATIVE REPORT     Performed at [facility]   Report Status PENDING   Incomplete     Studies: Dg Tibia/fibula Left  04/09/2014   CLINICAL DATA:  Clinical infected region in the medial aspect of the lower leg following animal scratch  EXAM: LEFT TIBIA AND FIBULA - 2 VIEW  COMPARISON:  Tibia and fibula film of March 3rd 2015  FINDINGS: The bones are adequately mineralized. There is no lytic or blastic lesion or periosteal reaction. There is subtle irregularity of the skin surface over the medial aspect of the distal leg and there is a small amount of soft tissue swelling. There is no soft tissue gas or foreign body.  IMPRESSION: 1. There is no plain film evidence of osteomyelitis. 2. Subtle changes in the soft tissues over the distal leg medially may reflect clinically known cellulitis.   Electronically Signed   By: David  Martinique   On: 04/09/2014 17:35    Scheduled Meds: . calcium carbonate  1 tablet Oral TID WC  . doxycycline  100 mg Oral Q12H  . enoxaparin (LOVENOX)  injection  30 mg Subcutaneous Q24H  . estrogens (conjugated)  0.625 mg Oral Daily  .  ezetimibe-simvastatin  1 tablet Oral QHS  . ferrous sulfate  325 mg Oral BID WC  . furosemide  10 mg Oral Daily  . irbesartan  300 mg Oral Daily  . levothyroxine  75 mcg Oral QAC breakfast  . mupirocin cream   Topical BID  . olopatadine  1 drop Both Eyes BID  . omega-3 acid ethyl esters  1 g Oral Daily  . sodium chloride  3 mL Intravenous Q12H   Continuous Infusions:     Time spent: > 35 minutes    Velvet Bathe  [facility] Pager [phone] If 7PM-7AM, please contact night-coverage at [email or web address], password [facility] 04/10/2014, 2:34 PM  LOS: 1 day

## 2014-04-10 NOTE — Consult Note (Signed)
WOC wound consult note Reason for Consult:FUll thickness ulceration with cellulitis at left medial LE Wound type:infectious Pressure Ulcer POA: No Measurement:2.5cm x 1.5cm x 0.2cm Wound NWG:[id number], clean, pink, moist Drainage (amount, consistency, odor) small amount serous exudate, no odor Periwound:intact with periwound erythema (resolving).  No induration. Dressing procedure/placement/frequency:We will continue the twice daily local care to the LLE using bacitracin ointment and covering the area with a soft silicone foam dressing.  Patient is encourages to maintain LE elevation whiel OOB in chair or while in bed.  She will be discharged to the continuing care of her [facility] with followup by her PCP next week (Monday). [place] nursing team will not follow, but will remain available to this patient, the nursing and medical team.  Please re-consult if needed. Thanks, Maudie Flakes, MSN, RN, [place], [place], [place] [phone])

## 2014-04-10 NOTE — Care Management Note (Unsigned)
    Page 1 of 1   04/10/2014     2:59:56 PM CARE MANAGEMENT NOTE 04/10/2014  Patient:  [name], [name]   Account Number:  [id number]  Date Initiated:  04/10/2014  Documentation initiated by:  [facility]  Subjective/Objective Assessment:   80 year old female with worsening left LE redness and swelling.     Action/Plan:   From home.   Anticipated DC Date:  04/13/2014   Anticipated DC Plan:  [place]  CM consult      Choice offered to / List presented to:             Status of service:  In process, will continue to follow Medicare Important Message given?   (If response is "NO", the following Medicare IM given date fields will be blank) Date Medicare IM given:   Date Additional Medicare IM given:    Discharge Disposition:    Per UR Regulation:  Reviewed for med. necessity/level of care/duration of stay  If discussed at [place] of Stay Meetings, dates discussed:    Comments:

## 2014-04-10 NOTE — Progress Notes (Signed)
INITIAL NUTRITION ASSESSMENT  DOCUMENTATION CODES Per approved criteria  -Obesity Unspecified   INTERVENTION: Resource Breeze po BID, each supplement provides 250 kcal and 9 grams of protein RD will continue to monitor for nutrition care plan  NUTRITION DIAGNOSIS: Inadequate oral intake related to left LE cellulitis as evidenced by reported intake less than estimated needs.   Goal: Pt to meet >/= 90% of their estimated nutrition needs   Monitor:  Wt, po intake, acceptance of supplements, labs  Reason for Assessment: MST  80 y.o. female  Admitting Dx: <principal problem not specified>  ASSESSMENT: 80 y.o. female with PMH significant for aortic stenosis, HLD, HTN who was recently admitted to hospital on 6-3 for failing outpatient treatment for left LE cellulitis with Augmentin. Last admission she was treated with IV clindamycin, redness and swelling improved. She was discharge on oral clindamycin. Patient relates that swelling and redness was almost gone when 2 days prior to admission, she started to have worsening redness and swelling of her LE extremity. She has been taking clindamycin. She relates that everything started after her dog scratch her.   - Pt reports that her appetite has been decreased, but that she has been eating because she knows she has to. - Pt reports that she has been experiencing diarrhea from medications she is on. - Meal completion is recorded as 75-100%  Height: Ht Readings from Last 1 Encounters:  04/09/14 5' (1.524 m)    Weight: Wt Readings from Last 1 Encounters:  04/10/14 154 lb 12.2 oz (70.2 kg)    Ideal Body Weight: 45.5 kg  % Ideal Body Weight: 154%  Wt Readings from Last 10 Encounters:  04/10/14 154 lb 12.2 oz (70.2 kg)  04/01/14 155 lb 8 oz (70.534 kg)  02/26/14 152 lb (68.947 kg)  02/19/14 151 lb 12.8 oz (68.856 kg)  02/10/14 152 lb (68.947 kg)  01/23/14 150 lb 8 oz (68.266 kg)  04/01/13 143 lb 6.4 oz (65.046 kg)  01/28/13 145  lb 14.4 oz (66.18 kg)  01/02/13 144 lb (65.318 kg)  12/31/12 144 lb (65.318 kg)    Usual Body Weight: 144 lbs  % Usual Body Weight: 107%  BMI:  Body mass index is 30.23 kg/(m^2).  Estimated Nutritional Needs: Kcal: 1750-1900 Protein: 80-90 g Fluid: 1.8-1.9 L/day  Skin: wound on left leg  Diet Order: Cardiac  EDUCATION NEEDS: -Education needs addressed   Intake/Output Summary (Last 24 hours) at 04/10/14 1531 Last data filed at 04/10/14 0400  Gross per 24 hour  Intake    100 ml  Output      0 ml  Net    100 ml    Last BM: PTA   Labs:   Recent Labs Lab 04/04/14 0603 04/05/14 0553 04/06/14 0525 04/09/14 1647 04/10/14 0425  NA 141 137 139 134* 138  K 3.8 4.0 4.0 4.9 4.6  CL 100 98 100 97 100  CO2 28 24 26 25 26   BUN 19 18 20 23 20   CREATININE 1.19* 1.15* 1.15* 1.21* 1.23*  CALCIUM 8.8 9.1 9.1 9.4 9.1  MG 1.7 1.7 1.8  --   --   GLUCOSE 100* 109* 108* 115* 119*    CBG (last 3)  No results found for this basename: GLUCAP,  in the last 72 hours  Scheduled Meds: . calcium carbonate  1 tablet Oral TID WC  . doxycycline  100 mg Oral Q12H  . enoxaparin (LOVENOX) injection  30 mg Subcutaneous Q24H  . estrogens (conjugated)  0.625  mg Oral Daily  . ezetimibe-simvastatin  1 tablet Oral QHS  . ferrous sulfate  325 mg Oral BID WC  . furosemide  10 mg Oral Daily  . irbesartan  300 mg Oral Daily  . levothyroxine  75 mcg Oral QAC breakfast  . mupirocin cream   Topical BID  . olopatadine  1 drop Both Eyes BID  . omega-3 acid ethyl esters  1 g Oral Daily  . sodium chloride  3 mL Intravenous Q12H    Continuous Infusions:   Past Medical History  Diagnosis Date  . HTN (hypertension)   . HLD (hyperlipidemia)   . Hypothyroidism   . Aortic stenosis, moderate     last echo in 2010; AVA 1.1; mild AS per echo in June 2013  . Edema of both legs     s/p laser treatment per Dr. Donnetta Hutching    Past Surgical History  Procedure Laterality Date  . Appendectomy  1955  .  Foot surgery  2003    bilateral Hammer Toe  . Eye surgery  2005    Bilateral cataract  . Endovenous ablation saphenous vein w/ laser  08-29-2012    left greater saphenous vein   Curt Jews MD  . Endovenous ablation saphenous vein w/ laser  09-19-2012    right greater saphenous vein by Curt Jews MD  . Stab phlebectomy Right 01-02-2013    10-15 incisions right thigh and calf by Curt Jews MD    Terrace Arabia RD, LDN

## 2014-04-11 LAB — CBC
HCT: 35.6 % — ABNORMAL LOW (ref 36.0–46.0)
Hemoglobin: 11.5 g/dL — ABNORMAL LOW (ref 12.0–15.0)
MCH: 29.6 pg (ref 26.0–34.0)
MCHC: 32.3 g/dL (ref 30.0–36.0)
MCV: 91.8 fL (ref 78.0–100.0)
Platelets: 381 10*3/uL (ref 150–400)
RBC: 3.88 MIL/uL (ref 3.87–5.11)
RDW: 13 % (ref 11.5–15.5)
WBC: 10.5 10*3/uL (ref 4.0–10.5)

## 2014-04-11 MED ORDER — SODIUM CHLORIDE 0.9 % IV SOLN: 250.0000 mL | INTRAVENOUS | Status: DC | PRN

## 2014-04-11 NOTE — Progress Notes (Signed)
[facility] PROGRESS NOTE  [name] RCV:[id number] DOB: 08-23-1927 DOA: 04/09/2014 PCP: Rachell Cipro, MD  Assessment/Plan: Principal problem: Cellulitis -Will discontinue IV antibiotics and place on doxycycline. - X-ray shows no underlying osteomyelitis - Most likely cause of elevated HR.   Active Problems:   Aortic stenosis - stable, continue current managent    HTN (hypertension) - Plan will be to continue Avapro    HLD (hyperlipidemia) - Stable, continue Vytorin  Code Status: full Family Communication: None at bedside Disposition Plan: Pending improvement in condition will d/c in 1-2 days   Consultants:  None  Procedures:  none  Antibiotics:  Doxycycline  HPI/Subjective: Pt has no new complaints. No acute issues reported overnight. Denies any CP or SOB  Objective: Filed Vitals:   04/11/14 1400  BP: 108/62  Pulse: 108  Temp: 98.7 F (37.1 C)  Resp: 18    Intake/Output Summary (Last 24 hours) at 04/11/14 1518 Last data filed at 04/11/14 1300  Gross per 24 hour  Intake    480 ml  Output      0 ml  Net    480 ml   Filed Weights   04/09/14 1954 04/10/14 0546  Weight: 70.308 kg (155 lb) 70.2 kg (154 lb 12.2 oz)    Exam:   General:  Pt in NAD, alert and awake  Cardiovascular: RRR, no MRG  Respiratory: CTA BL, no wheezes  Abdomen: soft, NT, ND  Musculoskeletal: no cyanosis or clubbing  Skin: LLE cellulitis at anterior left leg, no induration or fluctuance   Data Reviewed: Basic Metabolic Panel:  Recent Labs Lab 04/05/14 0553 04/06/14 0525 04/09/14 1647 04/10/14 0425  NA 137 139 134* 138  K 4.0 4.0 4.9 4.6  CL 98 100 97 100  CO2 24 26 25 26   GLUCOSE 109* 108* 115* 119*  BUN 18 20 23 20   CREATININE 1.15* 1.15* 1.21* 1.23*  CALCIUM 9.1 9.1 9.4 9.1  MG 1.7 1.8  --   --    Liver Function Tests:  Recent Labs Lab 04/05/14 0553 04/06/14 0525  AST 19 20  ALT 8 8  ALKPHOS 59 60  BILITOT 0.4 0.4  PROT 6.5  6.5  ALBUMIN 2.8* 2.7*   No results found for this basename: LIPASE, AMYLASE,  in the last 168 hours No results found for this basename: AMMONIA,  in the last 168 hours CBC:  Recent Labs Lab 04/05/14 0553 04/06/14 0525 04/09/14 1647 04/10/14 0425 04/11/14 0525  WBC 9.8 10.6* 12.8* 10.1 10.5  NEUTROABS 6.2 7.3  --   --   --   HGB 11.7* 11.9* 12.0 11.3* 11.5*  HCT 35.1* 36.6 36.8 34.6* 35.6*  MCV 91.4 92.2 91.5 92.0 91.8  PLT 356 332 386 380 381   Cardiac Enzymes: No results found for this basename: CKTOTAL, CKMB, CKMBINDEX, TROPONINI,  in the last 168 hours BNP (last 3 results) No results found for this basename: PROBNP,  in the last 8760 hours CBG: No results found for this basename: GLUCAP,  in the last 168 hours  Recent Results (from the past 240 hour(s))  CULTURE, BLOOD (ROUTINE X 2)     Status: None   Collection Time    04/09/14  5:00 PM      Result Value Ref Range Status   Specimen Description BLOOD LEFT ANTECUBITAL   Final   Special Requests BOTTLES DRAWN AEROBIC AND ANAEROBIC 5CC   Final   Culture  Setup Time     Final   Value:  04/09/2014 22:02     Performed at [facility]   Culture     Final   Value:        BLOOD CULTURE RECEIVED NO GROWTH TO DATE CULTURE WILL BE HELD FOR 5 DAYS BEFORE ISSUING A FINAL NEGATIVE REPORT     Performed at [facility]   Report Status PENDING   Incomplete  CULTURE, BLOOD (ROUTINE X 2)     Status: None   Collection Time    04/09/14  5:00 PM      Result Value Ref Range Status   Specimen Description BLOOD RIGHT HAND   Final   Special Requests BOTTLES DRAWN AEROBIC AND ANAEROBIC 4CC   Final   Culture  Setup Time     Final   Value: 04/09/2014 22:02     Performed at [facility]   Culture     Final   Value:        BLOOD CULTURE RECEIVED NO GROWTH TO DATE CULTURE WILL BE HELD FOR 5 DAYS BEFORE ISSUING A FINAL NEGATIVE REPORT     Performed at [facility]   Report Status PENDING   Incomplete      Studies: Dg Tibia/fibula Left  04/09/2014   CLINICAL DATA:  Clinical infected region in the medial aspect of the lower leg following animal scratch  EXAM: LEFT TIBIA AND FIBULA - 2 VIEW  COMPARISON:  Tibia and fibula film of March 3rd 2015  FINDINGS: The bones are adequately mineralized. There is no lytic or blastic lesion or periosteal reaction. There is subtle irregularity of the skin surface over the medial aspect of the distal leg and there is a small amount of soft tissue swelling. There is no soft tissue gas or foreign body.  IMPRESSION: 1. There is no plain film evidence of osteomyelitis. 2. Subtle changes in the soft tissues over the distal leg medially may reflect clinically known cellulitis.   Electronically Signed   By: David  Martinique   On: 04/09/2014 17:35    Scheduled Meds: . calcium carbonate  1 tablet Oral TID WC  . doxycycline  100 mg Oral Q12H  . enoxaparin (LOVENOX) injection  30 mg Subcutaneous Q24H  . estrogens (conjugated)  0.625 mg Oral Daily  . ezetimibe-simvastatin  1 tablet Oral QHS  . feeding supplement (RESOURCE BREEZE)  1 Container Oral BID BM  . ferrous sulfate  325 mg Oral BID WC  . furosemide  10 mg Oral Daily  . irbesartan  300 mg Oral Daily  . levothyroxine  75 mcg Oral QAC breakfast  . mupirocin cream   Topical BID  . olopatadine  1 drop Both Eyes BID  . omega-3 acid ethyl esters  1 g Oral Daily  . sodium chloride  3 mL Intravenous Q12H   Continuous Infusions:     Time spent: > 35 minutes    Velvet Bathe  [facility] Pager [phone] If 7PM-7AM, please contact night-coverage at [email or web address], password [facility] 04/11/2014, 3:18 PM  LOS: 2 days

## 2014-04-12 MED ORDER — DOXYCYCLINE HYCLATE 100 MG PO TABS: 100.0000 mg | ORAL_TABLET | Freq: Two times a day (BID) | ORAL | Status: DC

## 2014-04-12 MED ORDER — HYDROCODONE-ACETAMINOPHEN 5-325 MG PO TABS: 1.0000 | ORAL_TABLET | ORAL | Status: DC | PRN

## 2014-04-12 NOTE — Discharge Summary (Signed)
Physician Discharge Summary  [name] IRS:[id number] DOB: Sep 24, 1927 DOA: 04/09/2014  PCP: Rachell Cipro, MD  Admit date: 04/09/2014 Discharge date: 04/12/2014  Time spent: > 35 minutes  Recommendations for Outpatient Follow-up:  1. Please be sure to reassess wound and decide whether or not patient will require a more prolonged antibiotic regimen.  2. Discharged on doxycycline.    Discharge Diagnoses:  Active Problems:   Aortic stenosis   HTN (hypertension)   Cellulitis   HLD (hyperlipidemia)   Discharge Condition: stable  Diet recommendation: heart healthy diet.  Filed Weights   04/09/14 1954 04/10/14 0546 04/12/14 0500  Weight: 70.308 kg (155 lb) 70.2 kg (154 lb 12.2 oz) 70.035 kg (154 lb 6.4 oz)    History of present illness:  [name] is a 80 y.o. female with PMH significant for aortic stenosis, HLD, HTN who was recently admitted to hospital on 6-3 for failing outpatient treatment for left LE cellulitis with Augmentin and clindamycin.   Hospital Course:   Principal problem:  Cellulitis  - Continue doxycycline for 8 more days after discharge to complete a 12 day total course. - X-ray shows no underlying osteomyelitis  - Most likely cause of elevated HR.   Active Problems:  Aortic stenosis  - stable, continue current managent   HTN (hypertension)  - Plan will be to continue home regimen.  HLD (hyperlipidemia)  - Stable, continue Vytorin   Procedures:  none  Consultations:  none  Discharge Exam: Filed Vitals:   04/12/14 0517  BP: 131/93  Pulse: 110  Temp: 98.1 F (36.7 C)  Resp: 18    General: Pt in NAD, alert and awake Cardiovascular: RRR, no MRG Respiratory: CTA BL, no wheezes  Discharge Instructions You were cared for by a hospitalist during your hospital stay. If you have any questions about your discharge medications or the care you received while you were in the hospital after you are discharged, you can call the  unit and asked to speak with the hospitalist on call if the hospitalist that took care of you is not available. Once you are discharged, your primary care physician will handle any further medical issues. Please note that NO REFILLS for any discharge medications will be authorized once you are discharged, as it is imperative that you return to your primary care physician (or establish a relationship with a primary care physician if you do not have one) for your aftercare needs so that they can reassess your need for medications and monitor your lab values.  Discharge Instructions   Call MD for:  persistant dizziness or light-headedness    Complete by:  As directed      Call MD for:  temperature >100.4    Complete by:  As directed      Diet - low sodium heart healthy    Complete by:  As directed      Discharge instructions    Complete by:  As directed   Please be sure to follow up with your primary care physician in 1-2 weeks or sooner should any new concerns arise.     Increase activity slowly    Complete by:  As directed             Medication List    STOP taking these medications       clindamycin 300 MG capsule  Commonly known as:  CLEOCIN      TAKE these medications       acetaminophen 500 MG tablet  Commonly known as:  TYLENOL  Take 500 mg by mouth every 6 (six) hours as needed (pain).     calcium carbonate 600 MG Tabs tablet  Commonly known as:  OS-CAL  Take 1,800 mg by mouth daily.     diclofenac sodium 1 % Gel  Commonly known as:  VOLTAREN  Apply 2 g topically 4 (four) times daily as needed (For pain).     dicyclomine 20 MG tablet  Commonly known as:  BENTYL  Take 20 mg by mouth every 6 (six) hours as needed. For stomach irritability.     doxycycline 100 MG tablet  Commonly known as:  VIBRA-TABS  Take 1 tablet (100 mg total) by mouth every 12 (twelve) hours.     estrogens (conjugated) 0.625 MG tablet  Commonly known as:  PREMARIN  Take 0.625 mg by mouth  daily. Take daily for 21 days then do not take for 7 days.     ezetimibe-simvastatin 10-40 MG per tablet  Commonly known as:  VYTORIN  Take 1 tablet by mouth at bedtime.     ferrous sulfate 325 (65 FE) MG tablet  Take 1 tablet (325 mg total) by mouth 2 (two) times daily with a meal.     Fish Oil 1000 MG Caps  Take 1 capsule by mouth 3 (three) times daily.     furosemide 20 MG tablet  Commonly known as:  LASIX  Take 10 mg by mouth daily.     levothyroxine 75 MCG tablet  Commonly known as:  SYNTHROID, LEVOTHROID  Take 75 mcg by mouth daily.     medroxyPROGESTERone 2.5 MG tablet  Commonly known as:  PROVERA  Take 5 mg by mouth daily. For 10 days each month     mupirocin cream 2 %  Commonly known as:  BACTROBAN  Apply topically 2 (two) times daily.     olmesartan 40 MG tablet  Commonly known as:  BENICAR  Take 40 mg by mouth daily.     olopatadine 0.1 % ophthalmic solution  Commonly known as:  PATANOL  Place 1 drop into both eyes 2 (two) times daily.       Allergies  Allergen Reactions  . Aspirin Nausea And Vomiting  . Codeine Nausea And Vomiting    "Deathly Sick"  . Coreg [Carvedilol] Other (See Comments)    Fatigue/ ill  . Penicillins Rash      The results of significant diagnostics from this hospitalization (including imaging, microbiology, ancillary and laboratory) are listed below for reference.    Significant Diagnostic Studies: Dg Tibia/fibula Left  04/09/2014   CLINICAL DATA:  Clinical infected region in the medial aspect of the lower leg following animal scratch  EXAM: LEFT TIBIA AND FIBULA - 2 VIEW  COMPARISON:  Tibia and fibula film of March 3rd 2015  FINDINGS: The bones are adequately mineralized. There is no lytic or blastic lesion or periosteal reaction. There is subtle irregularity of the skin surface over the medial aspect of the distal leg and there is a small amount of soft tissue swelling. There is no soft tissue gas or foreign body.  IMPRESSION:  1. There is no plain film evidence of osteomyelitis. 2. Subtle changes in the soft tissues over the distal leg medially may reflect clinically known cellulitis.   Electronically Signed   By: David  Martinique   On: 04/09/2014 17:35   Dg Tibia/fibula Left  04/01/2014   CLINICAL DATA:  Cellulitis  EXAM: LEFT TIBIA AND FIBULA - 2  VIEW  COMPARISON:  None.  FINDINGS: There is no evidence of fracture or other focal bone lesions. Soft tissues are unremarkable. There is peripheral vascular atherosclerotic disease.  IMPRESSION: No acute osseous abnormality of the left tibia or fibula.   Electronically Signed   By: Kathreen Devoid   On: 04/01/2014 14:35    Microbiology: Recent Results (from the past 240 hour(s))  CULTURE, BLOOD (ROUTINE X 2)     Status: None   Collection Time    04/09/14  5:00 PM      Result Value Ref Range Status   Specimen Description BLOOD LEFT ANTECUBITAL   Final   Special Requests BOTTLES DRAWN AEROBIC AND ANAEROBIC 5CC   Final   Culture  Setup Time     Final   Value: 04/09/2014 22:02     Performed at [facility]   Culture     Final   Value:        BLOOD CULTURE RECEIVED NO GROWTH TO DATE CULTURE WILL BE HELD FOR 5 DAYS BEFORE ISSUING A FINAL NEGATIVE REPORT     Performed at [facility]   Report Status PENDING   Incomplete  CULTURE, BLOOD (ROUTINE X 2)     Status: None   Collection Time    04/09/14  5:00 PM      Result Value Ref Range Status   Specimen Description BLOOD RIGHT HAND   Final   Special Requests BOTTLES DRAWN AEROBIC AND ANAEROBIC 4CC   Final   Culture  Setup Time     Final   Value: 04/09/2014 22:02     Performed at [facility]   Culture     Final   Value:        BLOOD CULTURE RECEIVED NO GROWTH TO DATE CULTURE WILL BE HELD FOR 5 DAYS BEFORE ISSUING A FINAL NEGATIVE REPORT     Performed at [facility]   Report Status PENDING   Incomplete     Labs: Basic Metabolic Panel:  Recent Labs Lab 04/06/14 0525 04/09/14 1647  04/10/14 0425  NA 139 134* 138  K 4.0 4.9 4.6  CL 100 97 100  CO2 26 25 26   GLUCOSE 108* 115* 119*  BUN 20 23 20   CREATININE 1.15* 1.21* 1.23*  CALCIUM 9.1 9.4 9.1  MG 1.8  --   --    Liver Function Tests:  Recent Labs Lab 04/06/14 0525  AST 20  ALT 8  ALKPHOS 60  BILITOT 0.4  PROT 6.5  ALBUMIN 2.7*   No results found for this basename: LIPASE, AMYLASE,  in the last 168 hours No results found for this basename: AMMONIA,  in the last 168 hours CBC:  Recent Labs Lab 04/06/14 0525 04/09/14 1647 04/10/14 0425 04/11/14 0525  WBC 10.6* 12.8* 10.1 10.5  NEUTROABS 7.3  --   --   --   HGB 11.9* 12.0 11.3* 11.5*  HCT 36.6 36.8 34.6* 35.6*  MCV 92.2 91.5 92.0 91.8  PLT 332 386 380 381   Cardiac Enzymes: No results found for this basename: CKTOTAL, CKMB, CKMBINDEX, TROPONINI,  in the last 168 hours BNP: BNP (last 3 results) No results found for this basename: PROBNP,  in the last 8760 hours CBG: No results found for this basename: GLUCAP,  in the last 168 hours     Signed:  Velvet Bathe  [facility] 04/12/2014, 1:53 PM

## 2014-04-13 DIAGNOSIS — R000 Tachycardia, unspecified: Secondary | ICD-10-CM | POA: Diagnosis not present

## 2014-04-13 DIAGNOSIS — I4891 Unspecified atrial fibrillation: Secondary | ICD-10-CM | POA: Diagnosis not present

## 2014-04-13 DIAGNOSIS — S81809A Unspecified open wound, unspecified lower leg, initial encounter: Secondary | ICD-10-CM | POA: Diagnosis not present

## 2014-04-13 DIAGNOSIS — I10 Essential (primary) hypertension: Secondary | ICD-10-CM | POA: Diagnosis not present

## 2014-04-13 DIAGNOSIS — E559 Vitamin D deficiency, unspecified: Secondary | ICD-10-CM | POA: Diagnosis not present

## 2014-04-13 DIAGNOSIS — L02419 Cutaneous abscess of limb, unspecified: Secondary | ICD-10-CM | POA: Diagnosis not present

## 2014-04-13 DIAGNOSIS — E039 Hypothyroidism, unspecified: Secondary | ICD-10-CM | POA: Diagnosis not present

## 2014-04-14 ENCOUNTER — Telehealth: Payer: Self-pay | Admitting: Cardiovascular Disease

## 2014-04-14 DIAGNOSIS — S91009A Unspecified open wound, unspecified ankle, initial encounter: Secondary | ICD-10-CM | POA: Diagnosis not present

## 2014-04-14 DIAGNOSIS — S81009A Unspecified open wound, unspecified knee, initial encounter: Secondary | ICD-10-CM | POA: Diagnosis not present

## 2014-04-14 DIAGNOSIS — W64XXXA Exposure to other animate mechanical forces, initial encounter: Secondary | ICD-10-CM | POA: Diagnosis not present

## 2014-04-14 DIAGNOSIS — L02419 Cutaneous abscess of limb, unspecified: Secondary | ICD-10-CM | POA: Diagnosis not present

## 2014-04-14 DIAGNOSIS — D649 Anemia, unspecified: Secondary | ICD-10-CM | POA: Diagnosis not present

## 2014-04-14 DIAGNOSIS — N183 Chronic kidney disease, stage 3 unspecified: Secondary | ICD-10-CM | POA: Diagnosis not present

## 2014-04-14 DIAGNOSIS — I129 Hypertensive chronic kidney disease with stage 1 through stage 4 chronic kidney disease, or unspecified chronic kidney disease: Secondary | ICD-10-CM | POA: Diagnosis not present

## 2014-04-14 NOTE — Telephone Encounter (Signed)
Spoke with patient who states she has a staph infection in her left leg from a dog scratch and was hospitalized on 2 different occasions over the last couple of weeks.  Patient states her PCP, Dr. Ernie Hew wants to place her on Warfarin and Bystolic and she is concerned and wants Dr. Elmarie Shiley advice.  I advised patient that Dr. Acie Fredrickson is out of the office until Monday.  Patient states Dr. Ernie Hew wants to prescribe the Bystolic for her elevated heart rate and she is unsure of the reason for the Warfarin.  I advised patient that it is likely for prevention of blood clot due to her limited mobility with the leg injury.  Patient states she received blood thinning medication while in the hospital.  I advised patient to verify with Dr. Ernie Hew that the Bystolic is necessary since she has a history of syncope with hypotension.  Patient states the home health nurse is coming today and she will have her pulse checked and will call Dr. Ernie Hew and discuss with her.  I advised patient to discuss these medications with Dr. Ernie Hew and to make an informed decision and that I will forward message to Dr. Acie Fredrickson so that he will be aware.  Patient verbalized understanding and agreement.

## 2014-04-14 NOTE — Telephone Encounter (Signed)
New message     Pt has a staph infection from a dog scratch.  She has been in and out of the hosp.  PCP want patient to start warfarin and bystolic.  Pt want to make sure this is ok with Dr Acie Fredrickson.  Please advise.

## 2014-04-15 ENCOUNTER — Telehealth: Payer: Self-pay | Admitting: Cardiovascular Disease

## 2014-04-15 LAB — CULTURE, BLOOD (ROUTINE X 2)
Culture: NO GROWTH
Culture: NO GROWTH

## 2014-04-15 NOTE — Telephone Encounter (Signed)
Pt called to see if she can be work in for Monday 04/20/13. Pt states was in the hospital with staff infection in her leg. Due to dog scratched. Pt said that her PCP prescribed a blood thinner "Coumadin" and a heart medication "Bystalic" 5 mg once a day. She started taking this medication last night, and it made her very sick. Pt wants to take other blood thinner then coumadin. Pt has an appointment to see Dr. Acie Fredrickson on 05/11/14, but she wants to be seen sooner. Pt would like to be call  if someone cancelled the appointment even the same day.

## 2014-04-15 NOTE — Telephone Encounter (Signed)
New Message:  Pt is requesting to be seen Monday by Dr. Acie Fredrickson. She is requesting to be worked in.. Pt already has an appt with Dr. Acie Fredrickson and the PA.

## 2014-04-16 DIAGNOSIS — D649 Anemia, unspecified: Secondary | ICD-10-CM | POA: Diagnosis not present

## 2014-04-16 DIAGNOSIS — N183 Chronic kidney disease, stage 3 unspecified: Secondary | ICD-10-CM | POA: Diagnosis not present

## 2014-04-16 DIAGNOSIS — S81009A Unspecified open wound, unspecified knee, initial encounter: Secondary | ICD-10-CM | POA: Diagnosis not present

## 2014-04-16 DIAGNOSIS — W64XXXA Exposure to other animate mechanical forces, initial encounter: Secondary | ICD-10-CM | POA: Diagnosis not present

## 2014-04-16 DIAGNOSIS — S81809A Unspecified open wound, unspecified lower leg, initial encounter: Secondary | ICD-10-CM | POA: Diagnosis not present

## 2014-04-16 DIAGNOSIS — L02419 Cutaneous abscess of limb, unspecified: Secondary | ICD-10-CM | POA: Diagnosis not present

## 2014-04-16 DIAGNOSIS — I129 Hypertensive chronic kidney disease with stage 1 through stage 4 chronic kidney disease, or unspecified chronic kidney disease: Secondary | ICD-10-CM | POA: Diagnosis not present

## 2014-04-17 DIAGNOSIS — I4891 Unspecified atrial fibrillation: Secondary | ICD-10-CM | POA: Diagnosis not present

## 2014-04-17 DIAGNOSIS — R000 Tachycardia, unspecified: Secondary | ICD-10-CM | POA: Diagnosis not present

## 2014-04-17 DIAGNOSIS — Z5181 Encounter for therapeutic drug level monitoring: Secondary | ICD-10-CM | POA: Diagnosis not present

## 2014-04-17 DIAGNOSIS — L02419 Cutaneous abscess of limb, unspecified: Secondary | ICD-10-CM | POA: Diagnosis not present

## 2014-04-20 DIAGNOSIS — Z7901 Long term (current) use of anticoagulants: Secondary | ICD-10-CM | POA: Diagnosis not present

## 2014-04-20 DIAGNOSIS — R000 Tachycardia, unspecified: Secondary | ICD-10-CM | POA: Diagnosis not present

## 2014-04-20 DIAGNOSIS — I4891 Unspecified atrial fibrillation: Secondary | ICD-10-CM | POA: Diagnosis not present

## 2014-04-20 DIAGNOSIS — L02419 Cutaneous abscess of limb, unspecified: Secondary | ICD-10-CM | POA: Diagnosis not present

## 2014-04-20 DIAGNOSIS — L03119 Cellulitis of unspecified part of limb: Secondary | ICD-10-CM | POA: Diagnosis not present

## 2014-04-21 ENCOUNTER — Encounter: Payer: Self-pay | Admitting: Cardiovascular Disease

## 2014-04-21 DIAGNOSIS — I129 Hypertensive chronic kidney disease with stage 1 through stage 4 chronic kidney disease, or unspecified chronic kidney disease: Secondary | ICD-10-CM | POA: Diagnosis not present

## 2014-04-21 DIAGNOSIS — D649 Anemia, unspecified: Secondary | ICD-10-CM | POA: Diagnosis not present

## 2014-04-21 DIAGNOSIS — S81009A Unspecified open wound, unspecified knee, initial encounter: Secondary | ICD-10-CM | POA: Diagnosis not present

## 2014-04-21 DIAGNOSIS — W64XXXA Exposure to other animate mechanical forces, initial encounter: Secondary | ICD-10-CM | POA: Diagnosis not present

## 2014-04-21 DIAGNOSIS — L02419 Cutaneous abscess of limb, unspecified: Secondary | ICD-10-CM | POA: Diagnosis not present

## 2014-04-21 DIAGNOSIS — N183 Chronic kidney disease, stage 3 unspecified: Secondary | ICD-10-CM | POA: Diagnosis not present

## 2014-04-22 NOTE — Telephone Encounter (Signed)
Called and spoke with patient and advised that Dr. Acie Fredrickson has not had any cancellations on his schedule and he recommends she keep appointment tomorrow with Lyda Jester, PA-C.  Patient verbalized understanding and agreement and states she was told yesterday by Dr. Ernie Hew to hold Coumadin until Friday and then to take 1/2 tablet.  I advised patient to give this information to the PA at her office visit tomorrow.  Patient verbalized agreement and thanked me for the call.

## 2014-04-23 ENCOUNTER — Ambulatory Visit (INDEPENDENT_AMBULATORY_CARE_PROVIDER_SITE_OTHER): Payer: Medicare Other | Admitting: Cardiology

## 2014-04-23 ENCOUNTER — Encounter: Payer: Self-pay | Admitting: Cardiology

## 2014-04-23 VITALS — BP 148/82 | HR 61 | Ht 60.0 in | Wt 149.5 lb

## 2014-04-23 DIAGNOSIS — I6529 Occlusion and stenosis of unspecified carotid artery: Secondary | ICD-10-CM | POA: Diagnosis not present

## 2014-04-23 DIAGNOSIS — R9431 Abnormal electrocardiogram [ECG] [EKG]: Secondary | ICD-10-CM

## 2014-04-23 NOTE — Progress Notes (Signed)
Patient ID: [name], female   DOB: 02-26-1927, 80 y.o.   MRN: [id number]    04/23/2014 [name]   Jun 14, 1927  [id number]  Primary Physicia DEWEY,ELIZABETH, MD Primary Cardiologist: Dr. Acie Fredrickson  HPI:  The patient is a 80 year old female, followed by Dr. Acie Fredrickson. She has a history of treated hypertension, chronic bilateral leg edema, mild aortic stenosis and hyperlipidemia.  She had a 2-D echocardiogram in March 2015 which revealed normal systolic function with an EF of 60-65%, without wall motion abnormalities. Mild aortic stenosis was mentioned, with a valve area of 1.85 cm.  She was last seen, in clinic by Truitt Merle, NP, on 02/10/2014. She was seen by Ms. Gerhardt for post hospital followup, after being admitted to [facility] for evaluation of a reported syncopal episode that occurred while she was at a salon getting her hair done. Based on office records, it appeared that she was sitting and subsequently passed out and lost consciousness for a few minutes. She reportedly felt week prior but had no other symptoms. She denies any further syncopal spells since that one episode. Workup in the hospital revealed a normal CT of the head and normal 2-D echocardiogram. She was discharged home and had further cardiac workup as an outpatient. Ms. Servando Snare arranged for cardiac event monitoring, nuclear stress test as well as bilateral carotid Dopplers. Review of her telemetry strips reveal that she had no arrhythmias. Carotid Doppler studies demonstrated only mild heterogeneous plaque bilaterally, without significant stenosis. It appears that she did not followup for her stress test.  She presents back to clinic today after being referred by her primary care provider, Dr. Willette Pa, for evaluation for possible atrial fibrillation. Per the patient, Dr. Willette Pa was concerned of atrial fibrillation based on EKGs performed in our office. Subsequently, Dr. Willette Pa placed her on warfarin for  stroke prophylaxis and instructed her to followup with our office for further care. Unfortunately, we do not have access to those EKGs currently. However, her office EKG today demonstrates sinus rhythm with premature atrial complexes. P waves are present. Heart rate is 61 beats per minute. She denies any prior diagnosis of atrial fibrillation and denies any palpitations, dizziness, dyspnea and chest pain.   Current Outpatient Prescriptions  Medication Sig Dispense Refill  . acetaminophen (TYLENOL) 500 MG tablet Take 500 mg by mouth every 6 (six) hours as needed (pain).      . calcium carbonate (OS-CAL) 600 MG TABS Take 1,800 mg by mouth daily.      . diclofenac sodium (VOLTAREN) 1 % GEL Apply 2 g topically 4 (four) times daily as needed (For pain).      Marland Kitchen dicyclomine (BENTYL) 20 MG tablet Take 20 mg by mouth every 6 (six) hours as needed. For stomach irritability.      Marland Kitchen doxycycline (VIBRA-TABS) 100 MG tablet Take 1 tablet (100 mg total) by mouth every 12 (twelve) hours.  16 tablet  0  . estrogens, conjugated, (PREMARIN) 0.625 MG tablet Take 0.625 mg by mouth daily. Take daily for 21 days then do not take for 7 days.      Marland Kitchen ezetimibe-simvastatin (VYTORIN) 10-40 MG per tablet Take 1 tablet by mouth at bedtime.      . ferrous sulfate 325 (65 FE) MG tablet Take 1 tablet (325 mg total) by mouth 2 (two) times daily with a meal.  60 tablet  3  . furosemide (LASIX) 20 MG tablet Take 10 mg by mouth daily.       Marland Kitchen  levothyroxine (SYNTHROID, LEVOTHROID) 75 MCG tablet Take 75 mcg by mouth daily.        . medroxyPROGESTERone (PROVERA) 2.5 MG tablet Take 5 mg by mouth daily. For 10 days each month      . mupirocin cream (BACTROBAN) 2 % Apply topically 2 (two) times daily.  15 g  0  . olmesartan (BENICAR) 40 MG tablet Take 40 mg by mouth daily.      Marland Kitchen olopatadine (PATANOL) 0.1 % ophthalmic solution Place 1 drop into both eyes 2 (two) times daily.      . Omega-3 Fatty Acids (FISH OIL) 1000 MG CAPS Take 1 capsule  by mouth 3 (three) times daily.      . [DISCONTINUED] losartan-hydrochlorothiazide (HYZAAR) 100-12.5 MG per tablet Take 1 tablet by mouth daily.  30 tablet  11   No current facility-administered medications for this visit.    Allergies  Allergen Reactions  . Aspirin Nausea And Vomiting  . Codeine Nausea And Vomiting    "Deathly Sick"  . Coreg [Carvedilol] Other (See Comments)    Fatigue/ ill  . Penicillins Rash    History   Social History  . Marital Status: Widowed    Spouse Name: N/A    Number of Children: 2  . Years of Education: N/A   Occupational History  . retired    Social History Main Topics  . Smoking status: Former Smoker    Types: Cigarettes    Quit date: 10/31/1983  . Smokeless tobacco: Never Used  . Alcohol Use: No  . Drug Use: No  . Sexual Activity: No   Other Topics Concern  . Not on file   Social History Narrative  . No narrative on file     Review of Systems: General: negative for chills, fever, night sweats or weight changes.  Cardiovascular: negative for chest pain, dyspnea on exertion, edema, orthopnea, palpitations, paroxysmal nocturnal dyspnea or shortness of breath Dermatological: negative for rash Respiratory: negative for cough or wheezing Urologic: negative for hematuria Abdominal: negative for nausea, vomiting, diarrhea, bright red blood per rectum, melena, or hematemesis Neurologic: negative for visual changes, syncope, or dizziness All other systems reviewed and are otherwise negative except as noted above.    Blood pressure 148/82, pulse 61, height 5' (1.524 m), weight 149 lb 8 oz (67.813 kg).  General appearance: alert, cooperative and no distress Neck: no carotid bruit and no JVD Lungs: clear to auscultation bilaterally Heart: regular rate and rhythm and 2/6 SM Extremities: trace LEE with venous stasis dermatitis of bilateral distal lower extremities, left> right Pulses: 2+ and symmetric Skin: Warm and dry Neurologic:  Grossly normal  EKG: sinus rhythm with premature atrial complexes. Heart rate: 61 bpm  ASSESSMENT AND PLAN:   1. Abnormal EKG: Per the patient, her PCP, Dr. Willette Pa, had concerns of possible atrial fibrillation, based on EKGs that were performed several weeks ago in her office. Subsequently, the patient was placed on warfarin for stroke prophylaxis by Dr. Willette Pa. Per Dr. Elmarie Shiley office records, she has no prior history of atrial fibrillation. Her EKG in our office today demonstrates sinus rhythm with premature atrial complexes. P waves are present. The patient denies any symptoms of atrial fibrillation. We will contact the office of Dr. Willette Pa to obtain the EKGs that were concerning for atrial fibrillation. She has recently been evaluated in the last 2 months for arrhythmias through cardiac monitoring. No arrhythmias were detected at that time. We will review the EKGs, once sent from Dr. Bea Graff  office. If this does not appear to be atrial fibrillation, then we can likely discontinue warfarin. She is scheduled to followup with Dr. Acie Fredrickson or on 05/15/2014. I have encouraged her to get to keep this appointment.   PLAN  Will review EKGs from Dr. Bea Graff office to confirm whether or not this was actually atrial fibrillation. If this was not atrial fibrillation, then we can likely discontinue her warfarin as she otherwise has no prior documentation of atrial fibrillation. She has been instructed to keep her already scheduled appointment with Dr. Acie Fredrickson on 05/15/2014.  SIMMONS, BRITTAINYPA-C 04/23/2014 12:30 PM

## 2014-04-23 NOTE — Patient Instructions (Signed)
Please keep your follow-up with Dr Cathie Olden.

## 2014-04-24 ENCOUNTER — Encounter (HOSPITAL_COMMUNITY): Payer: Self-pay | Admitting: Emergency Medicine

## 2014-04-24 ENCOUNTER — Emergency Department (HOSPITAL_COMMUNITY)
Admission: EM | Admit: 2014-04-24 | Discharge: 2014-04-24 | Disposition: A | Discharge status: Home / Self Care | Payer: Medicare Other | Attending: Emergency Medicine | Admitting: Emergency Medicine

## 2014-04-24 ENCOUNTER — Emergency Department (HOSPITAL_COMMUNITY)
Admission: EM | Admit: 2014-04-24 | Discharge: 2014-04-25 | Disposition: A | Discharge status: Home / Self Care | Payer: Medicare Other | Source: Home / Self Care | Attending: Emergency Medicine | Admitting: Emergency Medicine

## 2014-04-24 DIAGNOSIS — Z87891 Personal history of nicotine dependence: Secondary | ICD-10-CM | POA: Insufficient documentation

## 2014-04-24 DIAGNOSIS — Z79899 Other long term (current) drug therapy: Secondary | ICD-10-CM | POA: Insufficient documentation

## 2014-04-24 DIAGNOSIS — D649 Anemia, unspecified: Secondary | ICD-10-CM | POA: Diagnosis not present

## 2014-04-24 DIAGNOSIS — N183 Chronic kidney disease, stage 3 unspecified: Secondary | ICD-10-CM | POA: Diagnosis not present

## 2014-04-24 DIAGNOSIS — I10 Essential (primary) hypertension: Secondary | ICD-10-CM

## 2014-04-24 DIAGNOSIS — L03119 Cellulitis of unspecified part of limb: Secondary | ICD-10-CM

## 2014-04-24 DIAGNOSIS — R21 Rash and other nonspecific skin eruption: Secondary | ICD-10-CM | POA: Diagnosis not present

## 2014-04-24 DIAGNOSIS — Z880 Allergy status to penicillin: Secondary | ICD-10-CM | POA: Insufficient documentation

## 2014-04-24 DIAGNOSIS — L02419 Cutaneous abscess of limb, unspecified: Secondary | ICD-10-CM

## 2014-04-24 DIAGNOSIS — Z9889 Other specified postprocedural states: Secondary | ICD-10-CM | POA: Diagnosis not present

## 2014-04-24 DIAGNOSIS — R011 Cardiac murmur, unspecified: Secondary | ICD-10-CM | POA: Diagnosis not present

## 2014-04-24 DIAGNOSIS — E039 Hypothyroidism, unspecified: Secondary | ICD-10-CM

## 2014-04-24 DIAGNOSIS — E785 Hyperlipidemia, unspecified: Secondary | ICD-10-CM | POA: Insufficient documentation

## 2014-04-24 DIAGNOSIS — S81009A Unspecified open wound, unspecified knee, initial encounter: Secondary | ICD-10-CM | POA: Diagnosis not present

## 2014-04-24 DIAGNOSIS — W64XXXA Exposure to other animate mechanical forces, initial encounter: Secondary | ICD-10-CM | POA: Diagnosis not present

## 2014-04-24 DIAGNOSIS — I129 Hypertensive chronic kidney disease with stage 1 through stage 4 chronic kidney disease, or unspecified chronic kidney disease: Secondary | ICD-10-CM | POA: Diagnosis not present

## 2014-04-24 DIAGNOSIS — L03116 Cellulitis of left lower limb: Secondary | ICD-10-CM

## 2014-04-24 MED ORDER — DOXYCYCLINE HYCLATE 100 MG PO TABS: 100.0000 mg | ORAL_TABLET | Freq: Two times a day (BID) | ORAL | Status: DC

## 2014-04-24 NOTE — ED Notes (Signed)
Delay in blood draw, edp examing pt

## 2014-04-24 NOTE — ED Provider Notes (Signed)
CSN: [id number]     Arrival date & time 04/24/14  2210 History   First MD Initiated Contact with Patient 04/24/14 2317     Chief Complaint  Patient presents with  . Leg Swelling  . Leg Pain     (Consider location/radiation/quality/duration/timing/severity/associated sxs/prior Treatment) HPI  Please note that this is a late entry.   This patient is an elderly woman was was seen by B. McCintyre, PA along with Dr.Beaton < 8 hrs ago and diagnoses with cellulitis of the left lower leg. She was restarted on doxycyline which had previously been effective in treating the same problem.   She says she has taken first dose of doxycycline and has not noticed improvement. She thinks she should have seen more improvement by now. But, she says, that she feels her leg is not m uch better. Although, it is not any worse. No fever. No systemic symptoms. Td utd.   Past Medical History  Diagnosis Date  . HTN (hypertension)   . HLD (hyperlipidemia)   . Hypothyroidism   . Aortic stenosis, moderate     last echo in 2010; AVA 1.1; mild AS per echo in June 2013  . Edema of both legs     s/p laser treatment per Dr. Donnetta Hutching   Past Surgical History  Procedure Laterality Date  . Appendectomy  1955  . Foot surgery  2003    bilateral Hammer Toe  . Eye surgery  2005    Bilateral cataract  . Endovenous ablation saphenous vein w/ laser  08-29-2012    left greater saphenous vein   Curt Jews MD  . Endovenous ablation saphenous vein w/ laser  09-19-2012    right greater saphenous vein by Curt Jews MD  . Stab phlebectomy Right 01-02-2013    10-15 incisions right thigh and calf by Curt Jews MD   Family History  Problem Relation Age of Onset  . Coronary artery disease    . Peripheral vascular disease    . Heart attack    . Heart disease Mother   . Peripheral vascular disease Mother   . Heart disease Father   . Peripheral vascular disease Father     Right leg amputation  . Hypertension Sister   . Heart  disease Brother 12    Heart Disease before age 68   History  Substance Use Topics  . Smoking status: Former Smoker    Types: Cigarettes    Quit date: 10/31/1983  . Smokeless tobacco: Never Used  . Alcohol Use: No   OB History   Grav Para Term Preterm Abortions TAB SAB Ect Mult Living                 Review of Systems Ten point review of symptoms performed and is negative with the exception of symptoms noted above.     Allergies  Aspirin; Codeine; Coreg; and Penicillins  Home Medications   Prior to Admission medications   Medication Sig Start Date End Date Taking? Authorizing Provider  acetaminophen (TYLENOL) 500 MG tablet Take 500 mg by mouth every 6 (six) hours as needed (pain).   Yes Historical Provider, MD  calcium carbonate (OS-CAL) 600 MG TABS Take 1,800 mg by mouth daily.   Yes Historical Provider, MD  diclofenac sodium (VOLTAREN) 1 % GEL Apply 2 g topically 4 (four) times daily as needed (For pain).   Yes Historical Provider, MD  dicyclomine (BENTYL) 20 MG tablet Take 20 mg by mouth every 6 (six) hours as  needed. For stomach irritability.   Yes Historical Provider, MD  doxycycline (VIBRA-TABS) 100 MG tablet Take 1 tablet (100 mg total) by mouth every 12 (twelve) hours. For two weeks 04/24/14  Yes Leeanne Rio, MD  estrogens, conjugated, (PREMARIN) 0.625 MG tablet Take 0.625 mg by mouth daily. Take daily for 21 days then do not take for 7 days.   Yes Historical Provider, MD  ezetimibe-simvastatin (VYTORIN) 10-40 MG per tablet Take 1 tablet by mouth at bedtime.   Yes Historical Provider, MD  levothyroxine (SYNTHROID, LEVOTHROID) 75 MCG tablet Take 75 mcg by mouth daily.     Yes Historical Provider, MD  medroxyPROGESTERone (PROVERA) 2.5 MG tablet Take 5 mg by mouth daily. For 10 days each month   Yes Historical Provider, MD  mupirocin cream (BACTROBAN) 2 % Apply topically 2 (two) times daily. 04/06/14  Yes Maryann Mikhail, DO  olmesartan (BENICAR) 40 MG tablet Take 40 mg  by mouth daily.   Yes Historical Provider, MD  olopatadine (PATANOL) 0.1 % ophthalmic solution Place 1 drop into both eyes 2 (two) times daily.   Yes Historical Provider, MD  Omega-3 Fatty Acids (FISH OIL) 1000 MG CAPS Take 1 capsule by mouth 3 (three) times daily.   Yes Historical Provider, MD   BP 167/85  Pulse 75  Temp(Src) 98.9 F (37.2 C) (Oral)  Resp 20  SpO2 98% Physical Exam Gen: well developed and well nourished appearing Head: NCAT Eyes: PERL, EOMI Nose: no epistaixis or rhinorrhea Mouth/throat: mucosa is moist and pink Neck: supple, no stridor Lungs: CTA B, no wheezing, rhonchi or rales CV: RRR, no murmur, extremities appear well perfused.  Abd: soft, notender, nondistended Back: no ttp, no cva ttp Skin: relatively small region of erythema with mild induration and increased warmth over the anterior mid left lower leg. Distal NVI, soft compartments, full passive rom at the knee and ankle joints.  Ext: normal to inspection, no dependent edema Neuro: CN ii-xii grossly intact, no focal deficits Psyche; normal affect,  calm and cooperative.   ED Course  Procedures (including critical care time) Labs Review   MDM   Patient with cellulitis - just started on Doxy. Actually, appears to be rather mild in severity. Patient reassured that it will take a good 24 to 48 hrs for her to see improvement on Doxy and we discussed the importance of elevation. The patient has responded will to Doxy for lower leg cellulitis in the past. She is stable for disharge with same and plan for close outpatient f/u. Counseled re: return precautions and continued pain management.     Elyn Peers, MD 04/30/14 [phone]

## 2014-04-24 NOTE — ED Provider Notes (Signed)
CSN: [id number]     Arrival date & time 04/24/14  1510 History   First MD Initiated Contact with Patient 04/24/14 1633     Chief Complaint  Patient presents with  . Cellulitis     (Consider location/radiation/quality/duration/timing/severity/associated sxs/prior Treatment) HPI  80 year old female with history of recurrent left lower extremity cellulitis, presenting today complaining of worsening redness and swelling of her left lower extremity. Patient stopped taking doxycycline on Tuesday of this week she finished her course. She's been hospitalized 2 times in the last month for cellulitis of her left leg. On Wednesday she began to notice worsening redness and swelling. Her outpatient physician and by she apply Aquaphor lotion, and patient noticed that her leg began to look worse after this. She is followed by home health nurse to address this further wound. The home health nurse came to her today and advise coming to the emergency room until it was looking worse. She denies any fevers, abdominal pain, chest pain, shortness of breath. She would very much like to avoid being admitted to the hospital today possible.  Past Medical History  Diagnosis Date  . HTN (hypertension)   . HLD (hyperlipidemia)   . Hypothyroidism   . Aortic stenosis, moderate     last echo in 2010; AVA 1.1; mild AS per echo in June 2013  . Edema of both legs     s/p laser treatment per Dr. Donnetta Hutching   Past Surgical History  Procedure Laterality Date  . Appendectomy  1955  . Foot surgery  2003    bilateral Hammer Toe  . Eye surgery  2005    Bilateral cataract  . Endovenous ablation saphenous vein w/ laser  08-29-2012    left greater saphenous vein   Curt Jews MD  . Endovenous ablation saphenous vein w/ laser  09-19-2012    right greater saphenous vein by Curt Jews MD  . Stab phlebectomy Right 01-02-2013    10-15 incisions right thigh and calf by Curt Jews MD   Family History  Problem Relation Age of Onset  .  Coronary artery disease    . Peripheral vascular disease    . Heart attack    . Heart disease Mother   . Peripheral vascular disease Mother   . Heart disease Father   . Peripheral vascular disease Father     Right leg amputation  . Hypertension Sister   . Heart disease Brother 66    Heart Disease before age 34   History  Substance Use Topics  . Smoking status: Former Smoker    Types: Cigarettes    Quit date: 10/31/1983  . Smokeless tobacco: Never Used  . Alcohol Use: No   OB History   Grav Para Term Preterm Abortions TAB SAB Ect Mult Living                 Review of Systems  Constitutional: Negative for fever.  Respiratory: Negative for shortness of breath.   Cardiovascular: Positive for leg swelling. Negative for chest pain.  Gastrointestinal: Negative for abdominal pain.  Skin: Positive for color change, rash and wound.  All other systems reviewed and are negative.     Allergies  Aspirin; Codeine; Coreg; and Penicillins  Home Medications   Prior to Admission medications   Medication Sig Start Date End Date Taking? Authorizing Provider  acetaminophen (TYLENOL) 500 MG tablet Take 500 mg by mouth every 6 (six) hours as needed (pain).    Historical Provider, MD  calcium carbonate (  OS-CAL) 600 MG TABS Take 1,800 mg by mouth daily.    Historical Provider, MD  diclofenac sodium (VOLTAREN) 1 % GEL Apply 2 g topically 4 (four) times daily as needed (For pain).    Historical Provider, MD  dicyclomine (BENTYL) 20 MG tablet Take 20 mg by mouth every 6 (six) hours as needed. For stomach irritability.    Historical Provider, MD  doxycycline (VIBRA-TABS) 100 MG tablet Take 1 tablet (100 mg total) by mouth every 12 (twelve) hours. 04/12/14   Velvet Bathe, MD  estrogens, conjugated, (PREMARIN) 0.625 MG tablet Take 0.625 mg by mouth daily. Take daily for 21 days then do not take for 7 days.    Historical Provider, MD  ezetimibe-simvastatin (VYTORIN) 10-40 MG per tablet Take 1 tablet  by mouth at bedtime.    Historical Provider, MD  ferrous sulfate 325 (65 FE) MG tablet Take 1 tablet (325 mg total) by mouth 2 (two) times daily with a meal. 04/06/14   Maryann Mikhail, DO  furosemide (LASIX) 20 MG tablet Take 10 mg by mouth daily.     Historical Provider, MD  levothyroxine (SYNTHROID, LEVOTHROID) 75 MCG tablet Take 75 mcg by mouth daily.      Historical Provider, MD  medroxyPROGESTERone (PROVERA) 2.5 MG tablet Take 5 mg by mouth daily. For 10 days each month    Historical Provider, MD  mupirocin cream (BACTROBAN) 2 % Apply topically 2 (two) times daily. 04/06/14   Maryann Mikhail, DO  olmesartan (BENICAR) 40 MG tablet Take 40 mg by mouth daily.    Historical Provider, MD  olopatadine (PATANOL) 0.1 % ophthalmic solution Place 1 drop into both eyes 2 (two) times daily.    Historical Provider, MD  Omega-3 Fatty Acids (FISH OIL) 1000 MG CAPS Take 1 capsule by mouth 3 (three) times daily.    Historical Provider, MD   BP 162/67  Pulse 66  Temp(Src) 98.7 F (37.1 C) (Oral)  Resp 14  SpO2 97% Physical Exam  Constitutional: She appears well-developed and well-nourished. No distress.  HENT:  Head: Normocephalic and atraumatic.  Mouth/Throat: Oropharynx is clear and moist.  Eyes: Pupils are equal, round, and reactive to light.  Neck: Normal range of motion. Neck supple.  Cardiovascular: Normal rate and regular rhythm.   Murmur heard. Pulmonary/Chest: Effort normal and breath sounds normal. No respiratory distress. She has no wheezes. She has no rales.  Abdominal: Soft. Bowel sounds are normal. She exhibits no distension and no mass. There is no tenderness. There is no rebound and no guarding.  Musculoskeletal: She exhibits edema and tenderness.  Neurological: She is alert.  Skin: Rash noted. She is not diaphoretic. There is erythema.  Left lower extremity with erythema and edema overlying the mid to lower shin. Mildly warm to touch. Mildly tender to palpation. Some areas are weeping.  No streaking up the leg. 2+ DP pulses bilaterally.  Psychiatric: She has a normal mood and affect. Her behavior is normal. Judgment normal.    ED Course  Procedures (including critical care time) Labs Review Labs Reviewed - No data to display  Imaging Review No results found.   EKG Interpretation None      MDM   Final diagnoses:  Cellulitis of left lower extremity    81 year old female with recurrent left lower extremity cellulitis, presenting with worsening cellulitis after having finished a course of doxycycline as an outpatient. She currently has no signs of systemic illness, is afebrile, is tolerating oral intake, and would like to retry  an outpatient course of oral antibiotics. No indication for IV antibiotics or hospital admission at this time. She'll be restarted on doxycycline for 2 weeks. She has followup already scheduled with a primary care physician. She was counseled on reasons to return to the emergency room, including fevers or streaking up her leg.  Dr. Audie Pinto also examined patient and agrees with this plan.  Chrisandra Netters, MD Family Medicine PGY-2    Leeanne Rio, MD 04/24/14 [phone]

## 2014-04-24 NOTE — ED Notes (Signed)
Pt reports being seen here earlier today for same, was dx with staph infection and was sent with abx rx.  Pt reports pain is getting worse.

## 2014-04-24 NOTE — Discharge Instructions (Signed)
Return if you have a fever, start generally feeling bad, or if you have any red streaking up your leg.  Cellulitis Cellulitis is an infection of the skin and the tissue beneath it. The infected area is usually red and tender. Cellulitis occurs most often in the arms and lower legs.  CAUSES  Cellulitis is caused by bacteria that enter the skin through cracks or cuts in the skin. The most common types of bacteria that cause cellulitis are Staphylococcus and Streptococcus. SYMPTOMS   Redness and warmth.  Swelling.  Tenderness or pain.  Fever. DIAGNOSIS  Your caregiver can usually determine what is wrong based on a physical exam. Blood tests may also be done. TREATMENT  Treatment usually involves taking an antibiotic medicine. HOME CARE INSTRUCTIONS   Take your antibiotics as directed. Finish them even if you start to feel better.  Keep the infected arm or leg elevated to reduce swelling.  Apply a warm cloth to the affected area up to 4 times per day to relieve pain.  Only take over-the-counter or prescription medicines for pain, discomfort, or fever as directed by your caregiver.  Keep all follow-up appointments as directed by your caregiver. SEEK MEDICAL CARE IF:   You notice red streaks coming from the infected area.  Your red area gets larger or turns dark in color.  Your bone or joint underneath the infected area becomes painful after the skin has healed.  Your infection returns in the same area or another area.  You notice a swollen bump in the infected area.  You develop new symptoms. SEEK IMMEDIATE MEDICAL CARE IF:   You have a fever.  You feel very sleepy.  You develop vomiting or diarrhea.  You have a general ill feeling (malaise) with muscle aches and pains. MAKE SURE YOU:   Understand these instructions.  Will watch your condition.  Will get help right away if you are not doing well or get worse. Document Released: 07/26/2005 Document Revised:  04/16/2012 Document Reviewed: 01/01/2012 [facility] Patient Information 2015 [place], [place]. This information is not intended to replace advice given to you by your health care provider. Make sure you discuss any questions you have with your health care provider.

## 2014-04-24 NOTE — ED Notes (Signed)
Has had redness/skin infection to left leg since before [place] Day.  Pt been given rounds of antibiotics.  Wound improves and get worse upon completion

## 2014-04-25 ENCOUNTER — Encounter: Payer: Self-pay | Admitting: Cardiovascular Disease

## 2014-04-25 LAB — CBC WITH DIFFERENTIAL/PLATELET
Basophils Absolute: 0 10*3/uL (ref 0.0–0.1)
Basophils Relative: 0 % (ref 0–1)
Eosinophils Absolute: 0.2 10*3/uL (ref 0.0–0.7)
Eosinophils Relative: 2 % (ref 0–5)
HCT: 36.2 % (ref 36.0–46.0)
Hemoglobin: 12.1 g/dL (ref 12.0–15.0)
Lymphocytes Relative: 18 % (ref 12–46)
Lymphs Abs: 2.1 10*3/uL (ref 0.7–4.0)
MCH: 30.4 pg (ref 26.0–34.0)
MCHC: 33.4 g/dL (ref 30.0–36.0)
MCV: 91 fL (ref 78.0–100.0)
Monocytes Absolute: 0.8 10*3/uL (ref 0.1–1.0)
Monocytes Relative: 7 % (ref 3–12)
Neutro Abs: 8.4 10*3/uL — ABNORMAL HIGH (ref 1.7–7.7)
Neutrophils Relative %: 73 % (ref 43–77)
Platelets: 363 10*3/uL (ref 150–400)
RBC: 3.98 MIL/uL (ref 3.87–5.11)
RDW: 13.5 % (ref 11.5–15.5)
WBC: 11.6 10*3/uL — ABNORMAL HIGH (ref 4.0–10.5)

## 2014-04-25 LAB — BASIC METABOLIC PANEL
BUN: 28 mg/dL — ABNORMAL HIGH (ref 6–23)
CO2: 22 mEq/L (ref 19–32)
Calcium: 9.4 mg/dL (ref 8.4–10.5)
Chloride: 102 mEq/L (ref 96–112)
Creatinine, Ser: 1.08 mg/dL (ref 0.50–1.10)
GFR calc Af Amer: 53 mL/min — ABNORMAL LOW (ref >90)
GFR calc non Af Amer: 46 mL/min — ABNORMAL LOW (ref >90)
Glucose, Bld: 108 mg/dL — ABNORMAL HIGH (ref 70–99)
Potassium: 4.6 mEq/L (ref 3.7–5.3)
Sodium: 136 mEq/L — ABNORMAL LOW (ref 137–147)

## 2014-04-26 NOTE — ED Provider Notes (Signed)
Medical screening examination/treatment/procedure(s) were performed by non-physician practitioner and as supervising physician I was immediately available for consultation/collaboration.   Dot Lanes, MD 04/26/14 [phone]

## 2014-04-27 ENCOUNTER — Ambulatory Visit (INDEPENDENT_AMBULATORY_CARE_PROVIDER_SITE_OTHER): Payer: Medicare Other | Admitting: Internal Medicine

## 2014-04-27 ENCOUNTER — Encounter: Payer: Self-pay | Admitting: Internal Medicine

## 2014-04-27 VITALS — BP 116/75 | HR 130 | Temp 98.5°F | Wt 147.0 lb

## 2014-04-27 DIAGNOSIS — I4891 Unspecified atrial fibrillation: Secondary | ICD-10-CM

## 2014-04-27 DIAGNOSIS — I359 Nonrheumatic aortic valve disorder, unspecified: Secondary | ICD-10-CM

## 2014-04-27 DIAGNOSIS — L02419 Cutaneous abscess of limb, unspecified: Secondary | ICD-10-CM | POA: Diagnosis not present

## 2014-04-27 DIAGNOSIS — I350 Nonrheumatic aortic (valve) stenosis: Secondary | ICD-10-CM

## 2014-04-27 DIAGNOSIS — I6529 Occlusion and stenosis of unspecified carotid artery: Secondary | ICD-10-CM | POA: Diagnosis not present

## 2014-04-27 DIAGNOSIS — L03119 Cellulitis of unspecified part of limb: Secondary | ICD-10-CM

## 2014-04-27 DIAGNOSIS — L03116 Cellulitis of left lower limb: Secondary | ICD-10-CM

## 2014-04-27 DIAGNOSIS — I480 Paroxysmal atrial fibrillation: Secondary | ICD-10-CM

## 2014-04-27 MED ORDER — RIVAROXABAN 15 MG PO TABS: 15.0000 mg | ORAL_TABLET | Freq: Every day | ORAL | Status: DC

## 2014-04-27 MED ORDER — METOPROLOL TARTRATE 25 MG PO TABS: 12.5000 mg | ORAL_TABLET | Freq: Two times a day (BID) | ORAL | Status: DC

## 2014-04-27 NOTE — Progress Notes (Signed)
Pre visit review using our clinic review tool, if applicable. No additional management support is needed unless otherwise documented below in the visit note. 

## 2014-04-27 NOTE — Patient Instructions (Signed)
Continue doxycycline  For  atrial fibrillation: Cardiology will call you tomorrow and get an appointment for this week Start XARELTO a blood thiner 15  mg one every night Start a low dose of a beta blocker, metoprolol 25 mg half tablet twice a day. If you feel the medication is too strong --->  please let us know or go to the ER if you have chest pain, difficulty breathing, feelin week  Next visit here 3-4 weeks

## 2014-04-27 NOTE — Progress Notes (Signed)
Subjective:    Patient ID: [name], female    DOB: Nov 08, 1926, 80 y.o.   MRN: [id number]  DOS:  04/27/2014 Type of  Visit: New patient, to get established, previous PCP Dr. Ernie Hew, here w/ her neighbor  History:  Her main concern is cellulitis, problem started more than 3 weeks ago, was initially treated as an outpatient, was admitted twice, then she went twice to the ER,  last visit 04/25/2014, she was felt to be stable and prescribed doxycycline for 2 weeks. Extensive chart review, see below. Also most recent creatinine, hemoglobin and TSH are satisfactory. She was found to be tachycardic today, she is asx    Med list reviewed, good compliance. Ambulatory BPs in the 150s.   ----------------------------------------------------- Excerpts from hospital records:  [place] date: 04/01/2014  Discharge date: 04/06/2014    History of present illness:  [name] is a 80 y.o. WF PMHx HTN, HLD, hypothyroidism, moderate aortic stenosis presents with worsening cellulitis to her left leg refractory to by mouth treatment (Augmentin). She states it started about a week and a half ago. It originally seemed to come from a scratch from her dog.   Hospital Course:  Left lower extremity cellulitis  -Improving  -Patient received 2 doses of Augmentin as an outpatient, cellulitis began to worsen  -Wound care consulted  -Initially placed on IV clindamycin, will discharge with PO clindamycin  -Patient currently afebrile with no leukocytosis  -PT consulted and did not recommend continued services  ---------------------------------------------------------- Admit date: 04/09/2014  Discharge date: 04/12/2014   History of present illness:  [name] is a 80 y.o. female with PMH significant for aortic stenosis, HLD, HTN who was recently admitted to hospital on 6-3 for failing outpatient treatment for left LE cellulitis with Augmentin and clindamycin.  Hospital Course:    Cellulitis    - Continue doxycycline for 8 more days after discharge to complete a 12 day total course.  - X-ray shows no underlying osteomyelitis  - Most likely cause of elevated HR.     ROS Currently doing well. This denies fever or chills No nausea, vomiting, diarrhea or blood in the stools.   Past Medical History  Diagnosis Date  . HTN (hypertension)   . HLD (hyperlipidemia)   . Hypothyroidism   . Aortic stenosis, moderate     last echo in 2010; AVA 1.1; mild AS per echo in June 2013  . Edema of both legs     s/p laser treatment per Dr. Donnetta Hutching    Past Surgical History  Procedure Laterality Date  . Appendectomy  1955  . Foot surgery  2003    bilateral Hammer Toe  . Eye surgery  2005    Bilateral cataract  . Endovenous ablation saphenous vein w/ laser  08-29-2012    left greater saphenous vein   Curt Jews MD  . Endovenous ablation saphenous vein w/ laser  09-19-2012    right greater saphenous vein by Curt Jews MD  . Stab phlebectomy Right 01-02-2013    10-15 incisions right thigh and calf by Curt Jews MD    History   Social History  . Marital Status: Widowed    Spouse Name: N/A    Number of Children: 2  . Years of Education: N/A   Occupational History  . retired    Social History Main Topics  . Smoking status: Former Smoker    Types: Cigarettes    Quit date: 10/31/1983  . Smokeless tobacco: Never  Used  . Alcohol Use: No  . Drug Use: No  . Sexual Activity: No   Other Topics Concern  . Not on file   Social History Narrative   Lives by herself         Medication List       This list is accurate as of: 04/27/14  5:52 PM.  Always use your most recent med list.               acetaminophen 500 MG tablet  Commonly known as:  TYLENOL  Take 500 mg by mouth every 6 (six) hours as needed (pain).     calcium carbonate 600 MG Tabs tablet  Commonly known as:  OS-CAL  Take 1,800 mg by mouth daily.     diclofenac sodium 1 % Gel  Commonly known as:  VOLTAREN   Apply 2 g topically 4 (four) times daily as needed (For pain).     dicyclomine 20 MG tablet  Commonly known as:  BENTYL  Take 20 mg by mouth every 6 (six) hours as needed. For stomach irritability.     doxycycline 100 MG tablet  Commonly known as:  VIBRA-TABS  Take 1 tablet (100 mg total) by mouth every 12 (twelve) hours. For two weeks     estrogens (conjugated) 0.625 MG tablet  Commonly known as:  PREMARIN  Take 0.625 mg by mouth daily. Take daily for 21 days then do not take for 7 days.     ezetimibe-simvastatin 10-40 MG per tablet  Commonly known as:  VYTORIN  Take 1 tablet by mouth at bedtime.     Fish Oil 1000 MG Caps  Take 1 capsule by mouth 3 (three) times daily.     levothyroxine 75 MCG tablet  Commonly known as:  SYNTHROID, LEVOTHROID  Take 75 mcg by mouth daily.     medroxyPROGESTERone 2.5 MG tablet  Commonly known as:  PROVERA  Take 5 mg by mouth daily. For 10 days each month     metoprolol tartrate 25 MG tablet  Commonly known as:  LOPRESSOR  Take 0.5 tablets (12.5 mg total) by mouth 2 (two) times daily.     mupirocin cream 2 %  Commonly known as:  BACTROBAN  Apply topically 2 (two) times daily.     olmesartan 40 MG tablet  Commonly known as:  BENICAR  Take 40 mg by mouth daily.     olopatadine 0.1 % ophthalmic solution  Commonly known as:  PATANOL  Place 1 drop into both eyes 2 (two) times daily.     Rivaroxaban 15 MG Tabs tablet  Commonly known as:  XARELTO  Take 1 tablet (15 mg total) by mouth daily with supper.           Objective:   Physical Exam BP 116/75  Pulse 130  Temp(Src) 98.5 F (36.9 C)  Wt 147 lb (66.679 kg)  SpO2 100%  General -- alert, well-developed, NAD.   HEENT-- Not pale.   Lungs -- normal respiratory effort, no intercostal retractions, no accessory muscle use, and normal breath sounds.  Heart-- tachycardic , regular? Soft murmur  Extremities--  Right lower  extremity: Multiple superficial varicose veins, + skin  hyperpigmentation distally. + Pedal pulses. Left lower extremity: Multiple superficial varicose veins without active phlebitis, + skin hyperpigmentation--violaceous color; multiple dry blisters. No fluctuance, discharge or odor. Neurologic--  alert & oriented X3. Speech normal  Psych-- Cognition and judgment appear intact. Cooperative with normal attention span and concentration. No  anxious or depressed appearing.       Assessment & Plan:   Atrial  flutter/fibrillation The patient was noted to be tachycardic, EKGs show A flutter/fibrillation. On chart review  Her last EKGs show sinus rhythm but there was one EKG from previous PCP that showed Afib . I discussed this with cardiology, pt  is allergic to aspirin. She recommend me to send the patient to the ER. Patient was quite reluctant to go to the emergency room. On further conversation with the patient, at some point she was prescribed Coumadin but she is not taking that at this time. I eventually spoke with Dr. Caryl Comes with the additional information I obtained from the patient , she has PAF, thus we agreed to the following plan: Metoprolol 25 mg twice a day  (h/o weakness w/ coreg, will start w/ 12.5 mg bid) Xarelto 15 mg daily, samples a nd a rx provided  Cards  will contact the patient and get her seen this week  Cellulitis,   improving, recommend to continue doxycycline.   F/u 1 month   Today , I spent more than  45  min with the patient: >50% of the time counseling regards Cellulitis, PAF, multiple questions asked. Also reviewing the chart and labs ordered by other providers  Also coordinating his care , spoke twice with cardiology

## 2014-04-28 ENCOUNTER — Telehealth: Payer: Self-pay | Admitting: Cardiovascular Disease

## 2014-04-28 ENCOUNTER — Telehealth: Payer: Self-pay | Admitting: Nurse Practitioner

## 2014-04-28 DIAGNOSIS — S81009A Unspecified open wound, unspecified knee, initial encounter: Secondary | ICD-10-CM | POA: Diagnosis not present

## 2014-04-28 DIAGNOSIS — D649 Anemia, unspecified: Secondary | ICD-10-CM | POA: Diagnosis not present

## 2014-04-28 DIAGNOSIS — W64XXXA Exposure to other animate mechanical forces, initial encounter: Secondary | ICD-10-CM | POA: Diagnosis not present

## 2014-04-28 DIAGNOSIS — S91009A Unspecified open wound, unspecified ankle, initial encounter: Secondary | ICD-10-CM | POA: Diagnosis not present

## 2014-04-28 DIAGNOSIS — L02419 Cutaneous abscess of limb, unspecified: Secondary | ICD-10-CM | POA: Diagnosis not present

## 2014-04-28 DIAGNOSIS — N183 Chronic kidney disease, stage 3 unspecified: Secondary | ICD-10-CM | POA: Diagnosis not present

## 2014-04-28 DIAGNOSIS — I129 Hypertensive chronic kidney disease with stage 1 through stage 4 chronic kidney disease, or unspecified chronic kidney disease: Secondary | ICD-10-CM | POA: Diagnosis not present

## 2014-04-28 NOTE — Telephone Encounter (Signed)
error 

## 2014-04-28 NOTE — Telephone Encounter (Signed)
Spoke with patient and advised her that Dr. Acie Fredrickson has read notes from Dr. Larose Kells including medication therapy recommendations made in conjunction with Dr. Caryl Comes yesterday and he agrees with the treatment plan.  Dr. Acie Fredrickson advised it is not necessary for patient to be seen this week; states appointment on Monday July 13 is sufficient unless patient experiences adverse symptoms.  I advised patient of his advice and advised her to call if she experiences elevated blood pressure, heart rate, chest pain or any other symptoms that are concerning to her.  I verified that patient has started Metoprolol and Xarelto as prescribed.  Patient verbalized understanding and agreement to plan of care and thanked me for the call.

## 2014-04-29 ENCOUNTER — Telehealth: Payer: Self-pay | Admitting: Internal Medicine

## 2014-04-29 NOTE — Telephone Encounter (Signed)
Caller name: Manuela Schwartz Relation to pt: [place] Call back number:[phone]   Reason for call:  Rn would like orders to keep seeing pt 1 time a week for 2 weeks to monitor leg wound.

## 2014-04-29 NOTE — Telephone Encounter (Signed)
Gave verbal order to continue [facility] once a week for the next 2 weeks to monitor leg wound.

## 2014-04-30 ENCOUNTER — Ambulatory Visit: Payer: Medicare Other | Admitting: Internal Medicine

## 2014-05-04 ENCOUNTER — Telehealth: Payer: Self-pay | Admitting: Internal Medicine

## 2014-05-04 MED ORDER — VERAPAMIL HCL 40 MG PO TABS: 40.0000 mg | ORAL_TABLET | Freq: Three times a day (TID) | ORAL | Status: DC

## 2014-05-04 NOTE — Telephone Encounter (Signed)
Caller name: Jianna  Call back number: [phone]   Reason for call:  Pt states that the medication for her heart, thinner and beta blocker, are making pt feel sick.   Pt like a contact call to discuss since pt has self stopped the medications.

## 2014-05-04 NOTE — Telephone Encounter (Signed)
Spoke with patient who advised that she stopped taking the Metoprolol and Xarelto yesterday because she felt so fatigued. Patient stated she would take the Xarelto tonight but does not want to take the metoprolol again. (Current dose is 12.5 mg BID, unable to tolerate coreg as well) She understands the risks involved but stated she does not want to take a medicine that causes her to feel so bad. Please advise if you want her to try something else.

## 2014-05-04 NOTE — Telephone Encounter (Signed)
Okay, continue Xarelto. Stop metoprolol Start verapamil 40 mg one tablet 3 times a day, rx sent.  If she feels  fatigued with the new medication also, decrease to bid

## 2014-05-04 NOTE — Telephone Encounter (Signed)
Spoke with patient and made aware of new medication recommendations. She is agreeable to try this.

## 2014-05-05 ENCOUNTER — Telehealth: Payer: Self-pay | Admitting: Family Medicine

## 2014-05-05 ENCOUNTER — Other Ambulatory Visit: Payer: Self-pay | Admitting: Nurse Practitioner

## 2014-05-05 ENCOUNTER — Telehealth: Payer: Self-pay | Admitting: Cardiovascular Disease

## 2014-05-05 DIAGNOSIS — D649 Anemia, unspecified: Secondary | ICD-10-CM | POA: Diagnosis not present

## 2014-05-05 DIAGNOSIS — W64XXXA Exposure to other animate mechanical forces, initial encounter: Secondary | ICD-10-CM | POA: Diagnosis not present

## 2014-05-05 DIAGNOSIS — S81009A Unspecified open wound, unspecified knee, initial encounter: Secondary | ICD-10-CM | POA: Diagnosis not present

## 2014-05-05 DIAGNOSIS — I129 Hypertensive chronic kidney disease with stage 1 through stage 4 chronic kidney disease, or unspecified chronic kidney disease: Secondary | ICD-10-CM | POA: Diagnosis not present

## 2014-05-05 DIAGNOSIS — N183 Chronic kidney disease, stage 3 unspecified: Secondary | ICD-10-CM | POA: Diagnosis not present

## 2014-05-05 DIAGNOSIS — L02419 Cutaneous abscess of limb, unspecified: Secondary | ICD-10-CM | POA: Diagnosis not present

## 2014-05-05 DIAGNOSIS — S81809A Unspecified open wound, unspecified lower leg, initial encounter: Secondary | ICD-10-CM | POA: Diagnosis not present

## 2014-05-05 NOTE — Telephone Encounter (Signed)
[name] has again reinstructed pt on how important it is to take the Xarelto & Verapamil. Readvised about worsening of symptoms to call [place]

## 2014-05-05 NOTE — Telephone Encounter (Signed)
New message    Home health in the home now would like to speak today.     patient C/O celitis in her leg new diagnose Afib. Started on xarelton, benicare,    PCP was contacted   Heart rate  153. Blood pressure  140/80 . Need management on medication.

## 2014-05-05 NOTE — Telephone Encounter (Signed)
Cards has spoken with [facility] RN. Manuela Schwartz and pt again has been instructed to take Xarelto and Verapamil as directed.   Spoke with Manuela Schwartz who states the pts leg is still red but not warm to touch. She is to see the patient again on Thursday and will call us if the leg is not better. She was advised that the patient may need to be seen in the office for reassessment of her leg

## 2014-05-05 NOTE — Telephone Encounter (Signed)
Manuela Schwartz [facility] RN calls today as she is with the pt regarding her heart rate & medications  1.)  Pt heart rate "jumping all around" between 94 -140's 2.) pt stopped taking metoprolol Sunday b/c of fatigue. No chest pain, no angina, no shortness of breath noted 3.) pt called pcp yesterday & was told to continue Xarelto, stop metoprolol, start verapamil 40 mg tid  4.) pt has not started verapamil, did not want to take the Xarelto, or the Benicar  5.) Manuela Schwartz states the pt is frustrated about what to do with her medications. Pt has not been able to pick up Verapamil  Vital signs today:  BP 140/80   HR  94-140s  She will give the pt her Metoprolol 12.5 mg right now to help with heart rate ( then discontinue).  Pt has agreed  & will start Verapamil 40mg  tonight at bedtime  If symptoms worsen she has been advised to call 911. OtherwiseManuela Schwartz [facility] will visit pt on Thursday. Medications clarified with Manuela Schwartz & pt. Horton Chin RN

## 2014-05-05 NOTE — Telephone Encounter (Signed)
Please advice patient: I again recommend her to take Xarelto and  at least try Verapamil  as prescribed

## 2014-05-05 NOTE — Telephone Encounter (Signed)
Caller name: [name] Relation to pt: [facility] Call back number:[phone] Pharmacy:  Reason for call: [name], from [facility] called because patient is not feeling well. Patient also has cellulitis in her left leg.  Please advise

## 2014-05-07 ENCOUNTER — Telehealth: Payer: Self-pay | Admitting: Family Medicine

## 2014-05-07 DIAGNOSIS — L02419 Cutaneous abscess of limb, unspecified: Secondary | ICD-10-CM | POA: Diagnosis not present

## 2014-05-07 DIAGNOSIS — N183 Chronic kidney disease, stage 3 unspecified: Secondary | ICD-10-CM | POA: Diagnosis not present

## 2014-05-07 DIAGNOSIS — D649 Anemia, unspecified: Secondary | ICD-10-CM | POA: Diagnosis not present

## 2014-05-07 DIAGNOSIS — S81009A Unspecified open wound, unspecified knee, initial encounter: Secondary | ICD-10-CM | POA: Diagnosis not present

## 2014-05-07 DIAGNOSIS — I129 Hypertensive chronic kidney disease with stage 1 through stage 4 chronic kidney disease, or unspecified chronic kidney disease: Secondary | ICD-10-CM | POA: Diagnosis not present

## 2014-05-07 DIAGNOSIS — W64XXXA Exposure to other animate mechanical forces, initial encounter: Secondary | ICD-10-CM | POA: Diagnosis not present

## 2014-05-07 DIAGNOSIS — L03119 Cellulitis of unspecified part of limb: Secondary | ICD-10-CM | POA: Diagnosis not present

## 2014-05-07 MED ORDER — BETAMETHASONE DIPROPIONATE 0.05 % EX CREA: TOPICAL_CREAM | Freq: Two times a day (BID) | CUTANEOUS | Status: DC

## 2014-05-07 NOTE — Telephone Encounter (Signed)
Spoke with Andee Poles, RN with [facility] who stated that the original area on the left leg does look better but now the patient had developed another area on the same leg that looks like more cellulitis is developing. RN stated that the area is red and warm to touch but no drainage at this time. RN stated that the patient will finish the current antibiotic tomorrow. (doxy 100mg  BID) Does patient need another office visit or can we send in another antibiotic? Please advise.

## 2014-05-07 NOTE — Telephone Encounter (Signed)
Caller name: Danielle Relation to pt: Nurse Call back number:[phone] Pharmacy:  Reason for call: Andee Poles, from [facility] called to speak with a nurse regarding patient's meds and cellulitis of the leg. Please advise.

## 2014-05-07 NOTE — Telephone Encounter (Signed)
She has been taken antibiotics for almost a month. For now I recommend leg elevation and I sent a prescription for a steroid cream. If the area gets much e worse or she has fever chills--->  needs to be seen

## 2014-05-07 NOTE — Telephone Encounter (Signed)
Attempted to call pt to make aware of new recommendations, unable to leave a voice mail. Rudene Christians, RN and made aware of new MD recommendations, she will see the patient and let her know.

## 2014-05-08 ENCOUNTER — Telehealth: Payer: Self-pay | Admitting: Internal Medicine

## 2014-05-08 NOTE — Telephone Encounter (Signed)
Caller name: Glora  Call back [place]   Reason for call:  Pt was given the RX betamethasone dipropionate (DIPROLENE) 0.05 % cream for her cellulitis, but pt has places on leg that are bursting from the cellulitis.  Should she continue to use the cream.

## 2014-05-08 NOTE — Telephone Encounter (Signed)
Please advise 

## 2014-05-10 NOTE — Telephone Encounter (Signed)
Use the cream, leg elevation and needs OV this week (wednesday pm?) urgent ov if sx severe, fever-chills

## 2014-05-11 ENCOUNTER — Ambulatory Visit (INDEPENDENT_AMBULATORY_CARE_PROVIDER_SITE_OTHER): Payer: Medicare Other | Admitting: Cardiovascular Disease

## 2014-05-11 ENCOUNTER — Ambulatory Visit: Payer: Medicare Other | Admitting: Cardiovascular Disease

## 2014-05-11 ENCOUNTER — Encounter: Payer: Self-pay | Admitting: Cardiovascular Disease

## 2014-05-11 VITALS — BP 120/60 | HR 103 | Ht 60.0 in | Wt 145.4 lb

## 2014-05-11 DIAGNOSIS — E785 Hyperlipidemia, unspecified: Secondary | ICD-10-CM

## 2014-05-11 DIAGNOSIS — I6529 Occlusion and stenosis of unspecified carotid artery: Secondary | ICD-10-CM

## 2014-05-11 DIAGNOSIS — I4819 Other persistent atrial fibrillation: Secondary | ICD-10-CM | POA: Insufficient documentation

## 2014-05-11 DIAGNOSIS — I350 Nonrheumatic aortic (valve) stenosis: Secondary | ICD-10-CM

## 2014-05-11 DIAGNOSIS — I359 Nonrheumatic aortic valve disorder, unspecified: Secondary | ICD-10-CM | POA: Diagnosis not present

## 2014-05-11 DIAGNOSIS — I482 Chronic atrial fibrillation, unspecified: Secondary | ICD-10-CM | POA: Insufficient documentation

## 2014-05-11 DIAGNOSIS — I4891 Unspecified atrial fibrillation: Secondary | ICD-10-CM | POA: Diagnosis not present

## 2014-05-11 DIAGNOSIS — I491 Atrial premature depolarization: Secondary | ICD-10-CM

## 2014-05-11 DIAGNOSIS — I480 Paroxysmal atrial fibrillation: Secondary | ICD-10-CM

## 2014-05-11 MED ORDER — METOPROLOL TARTRATE 25 MG PO TABS: 12.5000 mg | ORAL_TABLET | Freq: Two times a day (BID) | ORAL | Status: DC

## 2014-05-11 NOTE — Assessment & Plan Note (Signed)
Patient presents today for further evaluation and management of her atrial flutter ablation. She presented to the hospital with an episode of syncope in March. She was not noted to have atrial fibrillation at that time. She's had a long standing left leg infection-staph infection from a cat scratch.  It's been difficult to control her heart rate. She was initially tried on metoprolol but did not like how it made her feel. She was started on Verapamil but is not  taking her full dose of 40 mg 3 times a day.  She usually only takes it twice a day.   She did try taking metoprolol 12.5 mg twice a day and seems to tolerate that.  I think that she did not allow enough time to use to the metoprolol. I've encouraged her to restart metoprolol at 12.5 mg twice a day. We will gradually titrate up her dose for better heart rate control. We may need to add digoxin in the future appear. She'll see Cecille Rubin in one month. I'll see her in 3 months.

## 2014-05-11 NOTE — Progress Notes (Signed)
[name] Date of Birth  [birth year]       [facility]    [employer] [address], [place], [place]  [address]   [place],   [address] [phone]     [phone]   Fax  [phone]    Fax [phone]  Problem List: 1. Hypertension 2. Chronic leg edema 3. Hx of mild aortic stenosis 4. Hyperlipidemia  History of Present Illness:  [name] is an 80 yo with the above noted history.    We've been gradually uptitrated her medications. She was recently started on a higher dose of Coreg but she was not able to tolerate appear to cause her to be weak and dizzy.  She had an echo recently Echo Study Conclusions from June 2013   - Left ventricle: The cavity size was normal. Wall thickness was increased in a pattern of mild LVH. Systolic function was normal. The estimated ejection fraction was in the range of 55% to 60%. - Aortic valve: There was very mild stenosis. - Mitral valve: Mild regurgitation. - Left atrium: The atrium was mildly dilated. - Atrial septum: No defect or patent foramen ovale was Identified. - Pulmonary arteries: PA peak pressure: 39mm Hg (S). - Pericardium, extracardiac: A trivial pericardial effusion was identified.  She admits that she does not sleep well at night.   April 01, 2013:  She is feeling well.  Her BP is mildly elevated this am.  She avoids salt.    05/11/2014: [name]  was admitted to the hospital with an episode of syncope ( January 22, 2014).  She developed left lower leg cellulitis and had 2 admissions for that.    She has been found to have atrial fibrillation.   She has been started on coumadin    Current Outpatient Prescriptions on File Prior to Visit  Medication Sig Dispense Refill  . acetaminophen (TYLENOL) 500 MG tablet Take 500 mg by mouth every 6 (six) hours as needed (pain).      . BENICAR 40 MG tablet TAKE ONE TABLET BY MOUTH ONCE DAILY  30 tablet  0  .  calcium carbonate (OS-CAL) 600 MG TABS Take 1,800 mg by mouth daily.      . diclofenac sodium (VOLTAREN) 1 % GEL Apply 2 g topically 4 (four) times daily as needed (For pain).      Marland Kitchen dicyclomine (BENTYL) 20 MG tablet Take 20 mg by mouth every 6 (six) hours as needed. For stomach irritability.      Marland Kitchen estrogens, conjugated, (PREMARIN) 0.625 MG tablet Take 0.625 mg by mouth daily. Take daily for 21 days then do not take for 7 days.      Marland Kitchen ezetimibe-simvastatin (VYTORIN) 10-40 MG per tablet Take 1 tablet by mouth at bedtime.      Marland Kitchen levothyroxine (SYNTHROID, LEVOTHROID) 75 MCG tablet Take 75 mcg by mouth daily.        . medroxyPROGESTERone (PROVERA) 2.5 MG tablet Take 5 mg by mouth daily. For 10 days each month      . olopatadine (PATANOL) 0.1 % ophthalmic solution Place 1 drop into both eyes 2 (two) times daily.      . Omega-3 Fatty Acids (FISH OIL) 1000 MG CAPS Take 1 capsule by mouth 3 (three) times daily.      . Rivaroxaban (XARELTO) 15 MG TABS tablet Take 1 tablet (15 mg total) by mouth daily with supper.  30 tablet  2  .  verapamil (CALAN) 40 MG tablet Take 1 tablet (40 mg total) by mouth 3 (three) times daily.  90 tablet  0  . [DISCONTINUED] losartan-hydrochlorothiazide (HYZAAR) 100-12.5 MG per tablet Take 1 tablet by mouth daily.  30 tablet  11   No current facility-administered medications on file prior to visit.  she is not taking her coreg due to side effects ( fatigue, "sick"  Allergies  Allergen Reactions  . Aspirin Nausea And Vomiting  . Codeine Nausea And Vomiting    "Deathly Sick"  . Coreg [Carvedilol] Other (See Comments)    Fatigue/ ill  . Penicillins Rash    Past Medical History  Diagnosis Date  . HTN (hypertension)   . HLD (hyperlipidemia)   . Hypothyroidism   . Aortic stenosis, moderate     last echo in 2010; AVA 1.1; mild AS per echo in June 2013  . Edema of both legs     s/p laser treatment per Dr. Donnetta Hutching    Past Surgical History  Procedure Laterality Date  .  Appendectomy  1955  . Foot surgery  2003    bilateral Hammer Toe  . Eye surgery  2005    Bilateral cataract  . Endovenous ablation saphenous vein w/ laser  08-29-2012    left greater saphenous vein   Curt Jews MD  . Endovenous ablation saphenous vein w/ laser  09-19-2012    right greater saphenous vein by Curt Jews MD  . Stab phlebectomy Right 01-02-2013    10-15 incisions right thigh and calf by Curt Jews MD    History  Smoking status  . Former Smoker  . Types: Cigarettes  . Quit date: 10/31/1983  Smokeless tobacco  . Never Used    History  Alcohol Use No    Family History  Problem Relation Age of Onset  . Peripheral vascular disease    . Heart disease Mother   . Peripheral vascular disease Mother   . Heart disease Father   . Peripheral vascular disease Father     Right leg amputation  . Hypertension Sister   . Heart disease Brother 71    Heart Disease before age 71    Reviw of Systems:  Reviewed in the HPI.  All other systems are negative.  Physical Exam: Blood pressure 120/60, pulse 103, height 5' (1.524 m), weight 145 lb 6.4 oz (65.953 kg). General: Well developed, well nourished, in no acute distress.  Head: Normocephalic, atraumatic, sclera non-icteric, mucus membranes are moist,   Neck: Supple. Carotids are 2 + without bruits. No JVD  Lungs: Clear bilaterally to auscultation.  Heart: Irregularly irregular. Her heart rate is fast. She has a soft systolic murmur  Abdomen: Soft, non-tender, non-distended with normal bowel sounds. No hepatomegaly. No rebound/guarding. No masses.  Msk:  Strength and tone are normal  Extremities: Shows chronic stasis changes of her left leg. His cellulitis that is gradually healing. She has a scaly rash.  Has trace edema of her right leg  Neuro: Alert and oriented X 3. Moves all extremities spontaneously.  Psych:  Responds to questions appropriately with a normal affect.  ECG: May 11, 2014: coarse a-fib with V  rate of 103.  Assessment / Plan:

## 2014-05-11 NOTE — Patient Instructions (Addendum)
Your physician has recommended you make the following change in your medication:  STOP Verapamil START Metoprolol 12.5 mg twice daily  Your physician recommends that you return for a  follow-up appointment in: 1 month with Truitt Merle, NP - Monday August 17 @ 11:00 Return for a follow-up visit with Dr. Acie Fredrickson on Tuesday, October 13 @ 11:00

## 2014-05-11 NOTE — Telephone Encounter (Signed)
Dr. Ethel Rana recommendations were reviewed with [name].  She declined scheduling an appointment at this time.  She stated that on 05/08/14 she went to get her hair done and a few places on her legs opened up, therefore she called our office for further instructions.  She shared that she took a shower yesterday running warm water over her legs, and a lot of the dead skin came off of her legs and now her legs appear to be doing much better.  Therefore she does not want to make an appointment at this time.  She stated that she would call back to schedule an appointment if her symptoms worsen or continue to fail to improve.

## 2014-05-11 NOTE — Telephone Encounter (Signed)
Called and left a message for call back  

## 2014-05-12 DIAGNOSIS — D649 Anemia, unspecified: Secondary | ICD-10-CM | POA: Diagnosis not present

## 2014-05-12 DIAGNOSIS — N183 Chronic kidney disease, stage 3 unspecified: Secondary | ICD-10-CM | POA: Diagnosis not present

## 2014-05-12 DIAGNOSIS — I129 Hypertensive chronic kidney disease with stage 1 through stage 4 chronic kidney disease, or unspecified chronic kidney disease: Secondary | ICD-10-CM | POA: Diagnosis not present

## 2014-05-12 DIAGNOSIS — S81009A Unspecified open wound, unspecified knee, initial encounter: Secondary | ICD-10-CM | POA: Diagnosis not present

## 2014-05-12 DIAGNOSIS — W64XXXA Exposure to other animate mechanical forces, initial encounter: Secondary | ICD-10-CM | POA: Diagnosis not present

## 2014-05-12 DIAGNOSIS — L02419 Cutaneous abscess of limb, unspecified: Secondary | ICD-10-CM | POA: Diagnosis not present

## 2014-05-12 DIAGNOSIS — S91009A Unspecified open wound, unspecified ankle, initial encounter: Secondary | ICD-10-CM | POA: Diagnosis not present

## 2014-05-15 ENCOUNTER — Encounter (HOSPITAL_COMMUNITY): Payer: Self-pay | Admitting: Emergency Medicine

## 2014-05-15 ENCOUNTER — Telehealth: Payer: Self-pay | Admitting: Internal Medicine

## 2014-05-15 ENCOUNTER — Observation Stay (HOSPITAL_COMMUNITY)
Admission: EM | Admit: 2014-05-15 | Discharge: 2014-05-16 | Disposition: A | Discharge status: Home / Self Care | Payer: Medicare Other | Attending: Internal Medicine | Admitting: Internal Medicine

## 2014-05-15 DIAGNOSIS — I480 Paroxysmal atrial fibrillation: Secondary | ICD-10-CM

## 2014-05-15 DIAGNOSIS — I4891 Unspecified atrial fibrillation: Secondary | ICD-10-CM | POA: Insufficient documentation

## 2014-05-15 DIAGNOSIS — L02419 Cutaneous abscess of limb, unspecified: Secondary | ICD-10-CM | POA: Diagnosis not present

## 2014-05-15 DIAGNOSIS — E785 Hyperlipidemia, unspecified: Secondary | ICD-10-CM | POA: Diagnosis not present

## 2014-05-15 DIAGNOSIS — Z87891 Personal history of nicotine dependence: Secondary | ICD-10-CM | POA: Diagnosis not present

## 2014-05-15 DIAGNOSIS — I359 Nonrheumatic aortic valve disorder, unspecified: Secondary | ICD-10-CM | POA: Insufficient documentation

## 2014-05-15 DIAGNOSIS — K922 Gastrointestinal hemorrhage, unspecified: Secondary | ICD-10-CM | POA: Diagnosis not present

## 2014-05-15 DIAGNOSIS — I4819 Other persistent atrial fibrillation: Secondary | ICD-10-CM | POA: Diagnosis present

## 2014-05-15 DIAGNOSIS — K625 Hemorrhage of anus and rectum: Secondary | ICD-10-CM | POA: Diagnosis present

## 2014-05-15 DIAGNOSIS — E039 Hypothyroidism, unspecified: Secondary | ICD-10-CM | POA: Diagnosis not present

## 2014-05-15 DIAGNOSIS — I350 Nonrheumatic aortic (valve) stenosis: Secondary | ICD-10-CM

## 2014-05-15 DIAGNOSIS — K2971 Gastritis, unspecified, with bleeding: Secondary | ICD-10-CM | POA: Diagnosis not present

## 2014-05-15 DIAGNOSIS — I482 Chronic atrial fibrillation, unspecified: Secondary | ICD-10-CM | POA: Diagnosis present

## 2014-05-15 DIAGNOSIS — Z7901 Long term (current) use of anticoagulants: Secondary | ICD-10-CM | POA: Insufficient documentation

## 2014-05-15 DIAGNOSIS — K921 Melena: Secondary | ICD-10-CM | POA: Diagnosis not present

## 2014-05-15 DIAGNOSIS — L03119 Cellulitis of unspecified part of limb: Secondary | ICD-10-CM

## 2014-05-15 DIAGNOSIS — Z79899 Other long term (current) drug therapy: Secondary | ICD-10-CM | POA: Insufficient documentation

## 2014-05-15 DIAGNOSIS — L03116 Cellulitis of left lower limb: Secondary | ICD-10-CM

## 2014-05-15 DIAGNOSIS — Z9089 Acquired absence of other organs: Secondary | ICD-10-CM | POA: Insufficient documentation

## 2014-05-15 DIAGNOSIS — I10 Essential (primary) hypertension: Secondary | ICD-10-CM | POA: Insufficient documentation

## 2014-05-15 DIAGNOSIS — K2991 Gastroduodenitis, unspecified, with bleeding: Secondary | ICD-10-CM | POA: Diagnosis not present

## 2014-05-15 HISTORY — DX: Unspecified atrial fibrillation: I48.91

## 2014-05-15 LAB — CBC WITH DIFFERENTIAL/PLATELET
Basophils Absolute: 0 10*3/uL (ref 0.0–0.1)
Basophils Relative: 0 % (ref 0–1)
Eosinophils Absolute: 0.2 10*3/uL (ref 0.0–0.7)
Eosinophils Relative: 2 % (ref 0–5)
HCT: 34.2 % — ABNORMAL LOW (ref 36.0–46.0)
Hemoglobin: 11.2 g/dL — ABNORMAL LOW (ref 12.0–15.0)
Lymphocytes Relative: 15 % (ref 12–46)
Lymphs Abs: 1.6 10*3/uL (ref 0.7–4.0)
MCH: 29.9 pg (ref 26.0–34.0)
MCHC: 32.7 g/dL (ref 30.0–36.0)
MCV: 91.4 fL (ref 78.0–100.0)
Monocytes Absolute: 0.8 10*3/uL (ref 0.1–1.0)
Monocytes Relative: 8 % (ref 3–12)
Neutro Abs: 7.7 10*3/uL (ref 1.7–7.7)
Neutrophils Relative %: 75 % (ref 43–77)
Platelets: 365 10*3/uL (ref 150–400)
RBC: 3.74 MIL/uL — ABNORMAL LOW (ref 3.87–5.11)
RDW: 13.5 % (ref 11.5–15.5)
WBC: 10.2 10*3/uL (ref 4.0–10.5)

## 2014-05-15 LAB — COMPREHENSIVE METABOLIC PANEL
ALT: 15 U/L (ref 0–35)
AST: 32 U/L (ref 0–37)
Albumin: 2.8 g/dL — ABNORMAL LOW (ref 3.5–5.2)
Alkaline Phosphatase: 84 U/L (ref 39–117)
Anion gap: 13 (ref 5–15)
BUN: 19 mg/dL (ref 6–23)
CO2: 24 mEq/L (ref 19–32)
Calcium: 9.4 mg/dL (ref 8.4–10.5)
Chloride: 99 mEq/L (ref 96–112)
Creatinine, Ser: 1.02 mg/dL (ref 0.50–1.10)
GFR calc Af Amer: 57 mL/min — ABNORMAL LOW (ref >90)
GFR calc non Af Amer: 49 mL/min — ABNORMAL LOW (ref >90)
Glucose, Bld: 119 mg/dL — ABNORMAL HIGH (ref 70–99)
Potassium: 4.5 mEq/L (ref 3.7–5.3)
Sodium: 136 mEq/L — ABNORMAL LOW (ref 137–147)
Total Bilirubin: 0.4 mg/dL (ref 0.3–1.2)
Total Protein: 7 g/dL (ref 6.0–8.3)

## 2014-05-15 LAB — PROTIME-INR
INR: 1.47 (ref 0.00–1.49)
Prothrombin Time: 17.8 seconds — ABNORMAL HIGH (ref 11.6–15.2)

## 2014-05-15 LAB — ABO/RH: ABO/RH(D): A POS

## 2014-05-15 LAB — TSH: TSH: 1.78 u[IU]/mL (ref 0.350–4.500)

## 2014-05-15 LAB — APTT: aPTT: 44 seconds — ABNORMAL HIGH (ref 24–37)

## 2014-05-15 MED ORDER — ONDANSETRON HCL 4 MG PO TABS: 4.0000 mg | ORAL_TABLET | Freq: Four times a day (QID) | ORAL | Status: DC | PRN

## 2014-05-15 MED ORDER — ALUM & MAG HYDROXIDE-SIMETH 200-200-20 MG/5ML PO SUSP: 30.0000 mL | Freq: Four times a day (QID) | ORAL | Status: DC | PRN

## 2014-05-15 MED ORDER — SODIUM CHLORIDE 0.9 % IV SOLN: 250.0000 mL | INTRAVENOUS | Status: DC | PRN

## 2014-05-15 MED ORDER — LEVOTHYROXINE SODIUM 75 MCG PO TABS
75.0000 ug | ORAL_TABLET | Freq: Every day | ORAL | Status: DC
  Administered 2014-05-16: 75 ug via ORAL
  Filled 2014-05-15 (×2): qty 1

## 2014-05-15 MED ORDER — VANCOMYCIN HCL IN DEXTROSE 750-5 MG/150ML-% IV SOLN
750.0000 mg | INTRAVENOUS | Status: DC
  Filled 2014-05-15: qty 150

## 2014-05-15 MED ORDER — METOPROLOL TARTRATE 12.5 MG HALF TABLET
12.5000 mg | ORAL_TABLET | Freq: Two times a day (BID) | ORAL | Status: DC
  Administered 2014-05-15: 12.5 mg via ORAL
  Filled 2014-05-15 (×3): qty 1

## 2014-05-15 MED ORDER — SODIUM CHLORIDE 0.9 % IV SOLN
1000.0000 mL | INTRAVENOUS | Status: DC
Start: 2014-05-15 — End: 2014-05-15
  Administered 2014-05-15: 1000 mL via INTRAVENOUS

## 2014-05-15 MED ORDER — SODIUM CHLORIDE 0.9 % IJ SOLN: 3.0000 mL | INTRAMUSCULAR | Status: DC | PRN

## 2014-05-15 MED ORDER — VANCOMYCIN HCL IN DEXTROSE 1-5 GM/200ML-% IV SOLN
1000.0000 mg | INTRAVENOUS | Status: DC
  Filled 2014-05-15: qty 200

## 2014-05-15 MED ORDER — OXYCODONE HCL 5 MG PO TABS: 5.0000 mg | ORAL_TABLET | ORAL | Status: DC | PRN

## 2014-05-15 MED ORDER — SODIUM CHLORIDE 0.9 % IJ SOLN
3.0000 mL | Freq: Two times a day (BID) | INTRAMUSCULAR | Status: DC
  Administered 2014-05-16: 3 mL via INTRAVENOUS

## 2014-05-15 MED ORDER — ACETAMINOPHEN 500 MG PO TABS: 1000.0000 mg | ORAL_TABLET | Freq: Four times a day (QID) | ORAL | Status: DC | PRN

## 2014-05-15 MED ORDER — EZETIMIBE-SIMVASTATIN 10-40 MG PO TABS
1.0000 | ORAL_TABLET | Freq: Every day | ORAL | Status: DC
  Administered 2014-05-15: 1 via ORAL
  Filled 2014-05-15 (×2): qty 1

## 2014-05-15 MED ORDER — IRBESARTAN 300 MG PO TABS
300.0000 mg | ORAL_TABLET | Freq: Every day | ORAL | Status: DC
  Administered 2014-05-15 – 2014-05-16 (×2): 300 mg via ORAL
  Filled 2014-05-15 (×2): qty 1

## 2014-05-15 MED ORDER — OLOPATADINE HCL 0.1 % OP SOLN
1.0000 [drp] | Freq: Two times a day (BID) | OPHTHALMIC | Status: DC
  Administered 2014-05-15 – 2014-05-16 (×2): 1 [drp] via OPHTHALMIC
  Filled 2014-05-15: qty 5

## 2014-05-15 MED ORDER — ONDANSETRON HCL 4 MG/2ML IJ SOLN: 4.0000 mg | Freq: Four times a day (QID) | INTRAMUSCULAR | Status: DC | PRN

## 2014-05-15 MED ORDER — SODIUM CHLORIDE 0.9 % IJ SOLN
3.0000 mL | Freq: Two times a day (BID) | INTRAMUSCULAR | Status: DC
  Administered 2014-05-15: 3 mL via INTRAVENOUS

## 2014-05-15 NOTE — ED Notes (Signed)
Hospitalist at bedside 

## 2014-05-15 NOTE — Telephone Encounter (Signed)
Patient Information:  Caller Name: Emmalyn  Phone: [phone]  Patient: [name], [name]  Gender: Female  DOB: 1927/01/13  Age: 80 Years  PCP: Kathlene November  Office Follow Up:  Does the office need to follow up with this patient?: No  Instructions For The Office: N/A   Symptoms  Reason For Call & Symptoms: 05/10/14 tasting blood in mouth but none seen.  05/15/14 pt had blood with stool, pt states large amt.   Dizziness when first awakened, but none right now.  Pt is on Xarelto.    Due to ER disposition and no appt's available, spoke with Gaye RN in office who advised to send pt to ER.   Advised pt that she would need to go to ER now, asked if pt had someone to take her and she says no and she will not call [facility] to take her.   Pt states she has a sister and a son she will call.   Advised her to call them now due to pt needs to get to ER as soon as possible.  She agrees.   Advised pt a nurse would callback in 15/20 minutes to verify if she has found someone to transport her.  Reviewed Health History In EMR: Yes  Reviewed Medications In EMR: Yes  Reviewed Allergies In EMR: Yes  Reviewed Surgeries / Procedures: Yes  Date of Onset of Symptoms: 05/15/2014  Guideline(s) Used:  Rectal Bleeding  Disposition Per Guideline:   Go to ED Now (or to Office with PCP Approval)  Reason For Disposition Reached:   Bloody, black, or tarry bowel movements  Advice Given:  N/A  RN Overrode Recommendation:  Go To ED  Per Dannielle Karvonen, RN in office

## 2014-05-15 NOTE — ED Provider Notes (Signed)
CSN: [id number]     Arrival date & time 05/15/14  1017 History  First MD Initiated Contact with Patient 05/15/14 1027     Chief Complaint  Patient presents with  . Rectal Bleeding    HPI Pt presented to the ED with complaints of rectal bleeding.  She recently started taking xarelto for atrial fibrillation ( 2 weeks ago).  Over the last week she has noticed an odd taste in her mouth, like blood, but has noticed any blood in her mouth, or noticed any rectal bleeding, hematemisis.  This morning she woke up and felt a little lightheaded.  She rested a few minutes and then was able to get up without dizziness or lightheadedness.  She later went to have a BM and noticed bright red blood.  No shortness of breath, chest pain, abdominal pain, vomiting, or lightheadedness now. Past Medical History  Diagnosis Date  . HTN (hypertension)   . HLD (hyperlipidemia)   . Hypothyroidism   . Aortic stenosis, moderate     last echo in 2010; AVA 1.1; mild AS per echo in June 2013  . Edema of both legs     s/p laser treatment per Dr. Donnetta Hutching  . A-fib    Past Surgical History  Procedure Laterality Date  . Appendectomy  1955  . Foot surgery  2003    bilateral Hammer Toe  . Eye surgery  2005    Bilateral cataract  . Endovenous ablation saphenous vein w/ laser  08-29-2012    left greater saphenous vein   Curt Jews MD  . Endovenous ablation saphenous vein w/ laser  09-19-2012    right greater saphenous vein by Curt Jews MD  . Stab phlebectomy Right 01-02-2013    10-15 incisions right thigh and calf by Curt Jews MD   Family History  Problem Relation Age of Onset  . Peripheral vascular disease    . Heart disease Mother   . Peripheral vascular disease Mother   . Heart disease Father   . Peripheral vascular disease Father     Right leg amputation  . Hypertension Sister   . Heart disease Brother 67    Heart Disease before age 70   History  Substance Use Topics  . Smoking status: Former Smoker   Types: Cigarettes    Quit date: 10/31/1983  . Smokeless tobacco: Never Used  . Alcohol Use: No   OB History   Grav Para Term Preterm Abortions TAB SAB Ect Mult Living                 Review of Systems  Musculoskeletal:       Left lower leg pain  All other systems reviewed and are negative.     Allergies  Aspirin; Codeine; Coreg; and Penicillins  Home Medications   Prior to Admission medications   Medication Sig Start Date End Date Taking? Authorizing Provider  acetaminophen (TYLENOL) 500 MG tablet Take 1,000 mg by mouth every 6 (six) hours as needed (pain).    Yes Historical Provider, MD  calcium carbonate (OS-CAL) 600 MG TABS Take 1,800 mg by mouth daily.   Yes Historical Provider, MD  diclofenac sodium (VOLTAREN) 1 % GEL Apply 2 g topically 4 (four) times daily as needed (For pain).   Yes Historical Provider, MD  dicyclomine (BENTYL) 20 MG tablet Take 20 mg by mouth every 6 (six) hours as needed. For stomach irritability.   Yes Historical Provider, MD  estrogens, conjugated, (PREMARIN) 0.625 MG tablet Take  0.625 mg by mouth daily. Take daily for 21 days then do not take for 7 days.   Yes Historical Provider, MD  ezetimibe-simvastatin (VYTORIN) 10-40 MG per tablet Take 1 tablet by mouth at bedtime.   Yes Historical Provider, MD  levothyroxine (SYNTHROID, LEVOTHROID) 75 MCG tablet Take 75 mcg by mouth daily.     Yes Historical Provider, MD  medroxyPROGESTERone (PROVERA) 2.5 MG tablet Take 5 mg by mouth daily. For 10 days each month   Yes Historical Provider, MD  metoprolol tartrate (LOPRESSOR) 25 MG tablet Take 0.5 tablets (12.5 mg total) by mouth 2 (two) times daily. 05/11/14  Yes Thayer Headings, MD  olmesartan (BENICAR) 40 MG tablet Take 40 mg by mouth daily.   Yes Historical Provider, MD  olopatadine (PATANOL) 0.1 % ophthalmic solution Place 1 drop into both eyes 2 (two) times daily.   Yes Historical Provider, MD  Omega-3 Fatty Acids (FISH OIL) 1000 MG CAPS Take 1 capsule by  mouth 3 (three) times daily.   Yes Historical Provider, MD  Rivaroxaban (XARELTO) 15 MG TABS tablet Take 1 tablet (15 mg total) by mouth daily with supper. 04/27/14  Yes Colon Branch, MD   BP 158/59  Pulse 60  Temp(Src) 97.9 F (36.6 C) (Oral)  Resp 25  SpO2 100% Physical Exam  Nursing note and vitals reviewed. Constitutional: She appears well-developed and well-nourished. No distress.  HENT:  Head: Normocephalic and atraumatic.  Right Ear: External ear normal.  Left Ear: External ear normal.  Eyes: Conjunctivae are normal. Right eye exhibits no discharge. Left eye exhibits no discharge. No scleral icterus.  Neck: Neck supple. No tracheal deviation present.  Cardiovascular: Normal rate and intact distal pulses.  An irregularly irregular rhythm present.  Pulmonary/Chest: Effort normal and breath sounds normal. No stridor. No respiratory distress. She has no wheezes. She has no rales.  Abdominal: Soft. Bowel sounds are normal. She exhibits no distension. There is no tenderness. There is no rebound and no guarding.  Genitourinary:  External hemorrhoid, no bleeding, no mass on DRE, no visible blood noted on exam   Musculoskeletal: She exhibits no edema and no tenderness.  ttp and mild erythema lle below the knee (recent cellulitis dx and rx)  Neurological: She is alert. She has normal strength. No cranial nerve deficit (no facial droop, extraocular movements intact, no slurred speech) or sensory deficit. She exhibits normal muscle tone. She displays no seizure activity. Coordination normal.  Skin: Skin is warm and dry. No rash noted.  Psychiatric: She has a normal mood and affect.    ED Course  Procedures (including critical care time) Labs Review Labs Reviewed  CBC WITH DIFFERENTIAL - Abnormal; Notable for the following:    RBC 3.74 (*)    Hemoglobin 11.2 (*)    HCT 34.2 (*)    All other components within normal limits  COMPREHENSIVE METABOLIC PANEL - Abnormal; Notable for the  following:    Sodium 136 (*)    Glucose, Bld 119 (*)    Albumin 2.8 (*)    GFR calc non Af Amer 49 (*)    GFR calc Af Amer 57 (*)    All other components within normal limits  APTT - Abnormal; Notable for the following:    aPTT 44 (*)    All other components within normal limits  PROTIME-INR - Abnormal; Notable for the following:    Prothrombin Time 17.8 (*)    All other components within normal limits  POC OCCULT BLOOD,  ED  TYPE AND SCREEN  ABO/RH     MDM   Final diagnoses:  Gastrointestinal hemorrhage, unspecified gastritis, unspecified gastrointestinal hemorrhage type    Hgb has decreased since last tested.  Pt is otherwise stable.  With her recent xarelto use and guaic positive stools will consult with medical service regarding admission for monitoring, serial CBC.  DC xarelto   Dorie Rank, MD 05/15/14 [phone]

## 2014-05-15 NOTE — Telephone Encounter (Signed)
°  [name] back [place]   Reason for Mcdonald:  Pt called in stating she had blood in her stool.  Routed Mcdonald to [facility] for live person triage.

## 2014-05-15 NOTE — Telephone Encounter (Signed)
05/15/14  0930  Follow up with pt regarding transport to ER for GI bleed.    Pt states she was unable to find transport and will call 911 for transport.   She states she is gathering personal items and dressing and will call shortly.

## 2014-05-15 NOTE — Progress Notes (Addendum)
ANTIBIOTIC CONSULT NOTE - INITIAL  Pharmacy Consult for Vancomcyin Indication: Cellulitis  Allergies  Allergen Reactions  . Aspirin Nausea And Vomiting  . Codeine Nausea And Vomiting    "Deathly Sick"  . Coreg [Carvedilol] Other (See Comments)    Fatigue/ ill  . Penicillins Rash    Patient Measurements: Height: 5' (152.4 cm) Weight: 145 lb 8.1 oz (66 kg) IBW/kg (Calculated) : 45.5 Adjusted Body Weight:   Vital Signs: Temp: 98.1 F (36.7 C) (07/17 1456) Temp src: Oral (07/17 1456) BP: 153/72 mmHg (07/17 1330) Pulse Rate: 70 (07/17 1456) Intake/Output from previous day:   Intake/Output from this shift:    Labs:  Recent Labs  05/15/14 1049  WBC 10.2  HGB 11.2*  PLT 365  CREATININE 1.02   Estimated Creatinine Clearance: 34.8 ml/min (by C-G formula based on Cr of 1.02). No results found for this basename: VANCOTROUGH, VANCOPEAK, VANCORANDOM, GENTTROUGH, GENTPEAK, GENTRANDOM, TOBRATROUGH, TOBRAPEAK, TOBRARND, AMIKACINPEAK, AMIKACINTROU, AMIKACIN,  in the last 72 hours   Microbiology: No results found for this or any previous visit (from the past 720 hour(s)).  Medical History: Past Medical History  Diagnosis Date  . HTN (hypertension)   . HLD (hyperlipidemia)   . Hypothyroidism   . Aortic stenosis, moderate     last echo in 2010; AVA 1.1; mild AS per echo in June 2013  . Edema of both legs     s/p laser treatment per Dr. Donnetta Hutching  . A-fib     Assessment: 82 yoF with PMHx Afib recently started on xarelto, also hx LLE cellulitis from cat scratch which led to 2 hospitalizations in June presents with rectal bleed and xarelto discontinued.  Pharmacy consulted to start Vancomycin for persistent cellulitis.  Pt has been previously treated for this with augmentin, clindamycin, and doxycyline.  7/17 >> Vancomycin  >>  Tmax: 98.9 WBCs: 10.2K Renal: SCr 1.02, CrCl 35 ml/min (N46)  No culture data ordered  Goal of Therapy:  Vancomycin trough level 10-15  mcg/ml  Plan:  Vancomycin 750mg  IV q24h F/u clinical course, VT at C[facility] as appropriate  Ralene Bathe, PharmD, BCPS 05/15/2014, 3:08 PM  Pager: [phone]

## 2014-05-15 NOTE — ED Notes (Signed)
md at bedside  Pt alert and oriented x4. Respirations even and unlabored, bilateral symmetrical rise and fall of chest. Skin warm and dry. In no acute distress. Denies needs.   

## 2014-05-15 NOTE — ED Notes (Signed)
Per EMS-had BM this am and had bright red blood in/with stool-states it continued to hemorrhage while she got up-no dizziness, SOB-started Xarelto 2 weeks ago for Afib

## 2014-05-15 NOTE — ED Notes (Signed)
Report given to Jennifer, RN

## 2014-05-15 NOTE — H&P (Signed)
[facility] History and Physical  [name] GYI:[id number] DOB: 05-01-1927 DOA: 05/15/2014  Referring physician:  PCP: Rachell Cipro, MD   Chief Complaint: Bright Red Blood Per Rectum  HPI: [name] is a 80 y.o. female with a past medical history of Atrial Fibrillation/Flutter who was started on Xarelto on 04/27/2014. She also has a history of left lower extremity cellulitis that resulted from a cat scratch, that lead to 2 hospitalizations on 04/01/2014 and 04/09/2014. She has been treated with multiple courses of antimicrobial therapy including Augmentin, Clindamycin and Doxycycline. She reported being in her usual state of health until she got up this morning and noticed bright red blood per rectum. She denies associated pain, melena, diarrhea, fevers, chills, nausea,vomiting, hematemesis, or syncope, however did report some dizziness. She dies prior episodes of GI bleed. She presented to the emergency room at [facility] where initial lab work showed Hg of 11.2, hemodynamically stable. She has no other complaints.                                                                                                                                                                                                                        Review of Systems:  Constitutional:  No weight loss, night sweats, Fevers, chills, fatigue.  HEENT:  No headaches, Difficulty swallowing,Tooth/dental problems,Sore throat,  No sneezing, itching, ear ache, nasal congestion, post nasal drip,  Cardio-vascular:  No chest pain, Orthopnea, PND, swelling in lower extremities, anasarca, dizziness, palpitations  GI:  No heartburn, indigestion, abdominal pain, nausea, vomiting, diarrhea, change in bowel habits, loss of appetite Positive for bright reb blood per rectum Resp:  No shortness of breath with exertion or at rest. No excess mucus, no productive cough, No non-productive cough, No coughing  up of blood.No change in color of mucus.No wheezing.No chest wall deformity  Skin:  no rash or lesions.  GU:  no dysuria, change in color of urine, no urgency or frequency. No flank pain.  Musculoskeletal:  No joint pain or swelling. No decreased range of motion. No back pain.  Psych:  No change in mood or affect. No depression or anxiety. No memory loss.   Past Medical History  Diagnosis Date  . HTN (hypertension)   . HLD (hyperlipidemia)   . Hypothyroidism   . Aortic stenosis, moderate     last echo in 2010; AVA 1.1; mild AS per echo in June 2013  . Edema of both legs  s/p laser treatment per Dr. Donnetta Hutching  . A-fib    Past Surgical History  Procedure Laterality Date  . Appendectomy  1955  . Foot surgery  2003    bilateral Hammer Toe  . Eye surgery  2005    Bilateral cataract  . Endovenous ablation saphenous vein w/ laser  08-29-2012    left greater saphenous vein   Curt Jews MD  . Endovenous ablation saphenous vein w/ laser  09-19-2012    right greater saphenous vein by Curt Jews MD  . Stab phlebectomy Right 01-02-2013    10-15 incisions right thigh and calf by Curt Jews MD   Social History:  reports that she quit smoking about 30 years ago. Her smoking use included Cigarettes. She smoked 0.00 packs per day. She has never used smokeless tobacco. She reports that she does not drink alcohol or use illicit drugs.  Allergies  Allergen Reactions  . Aspirin Nausea And Vomiting  . Codeine Nausea And Vomiting    "Deathly Sick"  . Coreg [Carvedilol] Other (See Comments)    Fatigue/ ill  . Penicillins Rash    Family History  Problem Relation Age of Onset  . Peripheral vascular disease    . Heart disease Mother   . Peripheral vascular disease Mother   . Heart disease Father   . Peripheral vascular disease Father     Right leg amputation  . Hypertension Sister   . Heart disease Brother 66    Heart Disease before age 14     Prior to Admission medications     Medication Sig Start Date End Date Taking? Authorizing Provider  acetaminophen (TYLENOL) 500 MG tablet Take 1,000 mg by mouth every 6 (six) hours as needed (pain).    Yes Historical Provider, MD  calcium carbonate (OS-CAL) 600 MG TABS Take 1,800 mg by mouth daily.   Yes Historical Provider, MD  diclofenac sodium (VOLTAREN) 1 % GEL Apply 2 g topically 4 (four) times daily as needed (For pain).   Yes Historical Provider, MD  dicyclomine (BENTYL) 20 MG tablet Take 20 mg by mouth every 6 (six) hours as needed. For stomach irritability.   Yes Historical Provider, MD  estrogens, conjugated, (PREMARIN) 0.625 MG tablet Take 0.625 mg by mouth daily. Take daily for 21 days then do not take for 7 days.   Yes Historical Provider, MD  ezetimibe-simvastatin (VYTORIN) 10-40 MG per tablet Take 1 tablet by mouth at bedtime.   Yes Historical Provider, MD  levothyroxine (SYNTHROID, LEVOTHROID) 75 MCG tablet Take 75 mcg by mouth daily.     Yes Historical Provider, MD  medroxyPROGESTERone (PROVERA) 2.5 MG tablet Take 5 mg by mouth daily. For 10 days each month   Yes Historical Provider, MD  metoprolol tartrate (LOPRESSOR) 25 MG tablet Take 0.5 tablets (12.5 mg total) by mouth 2 (two) times daily. 05/11/14  Yes Thayer Headings, MD  olmesartan (BENICAR) 40 MG tablet Take 40 mg by mouth daily.   Yes Historical Provider, MD  olopatadine (PATANOL) 0.1 % ophthalmic solution Place 1 drop into both eyes 2 (two) times daily.   Yes Historical Provider, MD  Omega-3 Fatty Acids (FISH OIL) 1000 MG CAPS Take 1 capsule by mouth 3 (three) times daily.   Yes Historical Provider, MD  Rivaroxaban (XARELTO) 15 MG TABS tablet Take 1 tablet (15 mg total) by mouth daily with supper. 04/27/14  Yes Colon Branch, MD   Physical Exam: Filed Vitals:   05/15/14 1330  BP: 153/72  Pulse: 61  Temp: 98.9 F (37.2 C)  Resp: 22    BP 153/72  Pulse 61  Temp(Src) 98.9 F (37.2 C) (Oral)  Resp 22  SpO2 100%  General:  Appears calm and  comfortable Eyes: PERRL, normal lids, irises & conjunctiva ENT: grossly normal hearing, lips & tongue Neck: no LAD, masses or thyromegaly Cardiovascular: Irregular rate and rhythm, no m/r/g. No LE edema. Telemetry: SR, no arrhythmias  Respiratory: CTA bilaterally, no w/r/r. Normal respiratory effort. Abdomen: soft, ntnd Skin: Has left ankle erythema, no purulence or fluctuance Musculoskeletal: grossly normal tone BUE/BLE Psychiatric: grossly normal mood and affect, speech fluent and appropriate Neurologic: grossly non-focal.          Labs on Admission:  Basic Metabolic Panel:  Recent Labs Lab 05/15/14 1049  NA 136*  K 4.5  CL 99  CO2 24  GLUCOSE 119*  BUN 19  CREATININE 1.02  CALCIUM 9.4   Liver Function Tests:  Recent Labs Lab 05/15/14 1049  AST 32  ALT 15  ALKPHOS 84  BILITOT 0.4  PROT 7.0  ALBUMIN 2.8*   No results found for this basename: LIPASE, AMYLASE,  in the last 168 hours No results found for this basename: AMMONIA,  in the last 168 hours CBC:  Recent Labs Lab 05/15/14 1049  WBC 10.2  NEUTROABS 7.7  HGB 11.2*  HCT 34.2*  MCV 91.4  PLT 365   Cardiac Enzymes: No results found for this basename: CKTOTAL, CKMB, CKMBINDEX, TROPONINI,  in the last 168 hours  BNP (last 3 results) No results found for this basename: PROBNP,  in the last 8760 hours CBG: No results found for this basename: GLUCAP,  in the last 168 hours  Radiological Exams on Admission: No results found.  EKG: Independently reviewed.   Assessment/Plan Principal Problem:   Lower GI bleed Active Problems:   Left leg cellulitis   Atrial fibrillation   Aortic stenosis   HTN (hypertension)   Rectal bleeding   1. Painless Hematochezia. Patient recently started on Xarelto therapy for PAF, taking 15 mg PO q daily. She presents with a single episode of painless hematochezia, otherwise hemodynamically stable, Hg of 11.2. This may be secondary to hemorrhoidal bleed. I discussed  case with Dr Claiborne Billings of Cardiology who recommended holding Xarelto for now. Will admit patient to med/surg, monitor overnight, follow up on repeat CBC in AM. 2. Atrial Fibrillation. Presently rate controlled. Had been on anticoagulation with Xarelto. I discussed case with Dr Claiborne Billings who recommended stopping Louanna Raw for now and having her follow up in the office next where restarting anticoagulation will be addressed. Will continue Metoprolol 12.5 mg PO BID 3. History of Left Lower Extremity Cellulitis. Having having a cat scratch becoming infected, treated with multiple courses of antibiotic therapy including two hospitalizations in June for IV antimicrobial therapy. Although erythema appears to be present on my exam patient reporting that cellulitis has not worsened and that it was markedly improved. Will hold off on antibiotic therapy for now.  4. History of Aortic Stenosis. Last echo done on 01/22/2014 that showed mild stenosis with LVEF of 60-65%  5. HTN. Continue Benicar and Metoprolol 6. DVT prophylaxis. SCD's  Code Status: Full Code Family Communication:  Disposition Plan: Place in 24 hour obs  Time spent: 71 min  Kelvin Cellar [facility] Pager [phone]  **Disclaimer: This note may have been dictated with voice recognition software. Similar sounding words can inadvertently be transcribed and this note may contain transcription errors which may  not have been corrected upon publication of note.**

## 2014-05-15 NOTE — ED Notes (Addendum)
see triage note. pt recently started blood thinner for afib. reports this morning blood in stool, only time this has occured. denies nv/d. only  has pain 2/10 in left leg due to cellulitis. pt reports when she woke up she was dizzy and then had bloody bowel movement. upon assessment no blood around anal area. 1 hemrrhoid noted

## 2014-05-16 DIAGNOSIS — L02419 Cutaneous abscess of limb, unspecified: Secondary | ICD-10-CM | POA: Diagnosis not present

## 2014-05-16 DIAGNOSIS — K922 Gastrointestinal hemorrhage, unspecified: Secondary | ICD-10-CM | POA: Diagnosis not present

## 2014-05-16 DIAGNOSIS — L03119 Cellulitis of unspecified part of limb: Secondary | ICD-10-CM | POA: Diagnosis not present

## 2014-05-16 DIAGNOSIS — I4891 Unspecified atrial fibrillation: Secondary | ICD-10-CM | POA: Diagnosis not present

## 2014-05-16 DIAGNOSIS — I10 Essential (primary) hypertension: Secondary | ICD-10-CM

## 2014-05-16 LAB — BASIC METABOLIC PANEL
Anion gap: 13 (ref 5–15)
BUN: 19 mg/dL (ref 6–23)
CO2: 24 mEq/L (ref 19–32)
Calcium: 9.1 mg/dL (ref 8.4–10.5)
Chloride: 99 mEq/L (ref 96–112)
Creatinine, Ser: 0.98 mg/dL (ref 0.50–1.10)
GFR calc Af Amer: 60 mL/min — ABNORMAL LOW (ref >90)
GFR calc non Af Amer: 52 mL/min — ABNORMAL LOW (ref >90)
Glucose, Bld: 109 mg/dL — ABNORMAL HIGH (ref 70–99)
Potassium: 4.3 mEq/L (ref 3.7–5.3)
Sodium: 136 mEq/L — ABNORMAL LOW (ref 137–147)

## 2014-05-16 LAB — CBC
HCT: 33.3 % — ABNORMAL LOW (ref 36.0–46.0)
Hemoglobin: 11 g/dL — ABNORMAL LOW (ref 12.0–15.0)
MCH: 29.9 pg (ref 26.0–34.0)
MCHC: 33 g/dL (ref 30.0–36.0)
MCV: 90.5 fL (ref 78.0–100.0)
Platelets: 374 10*3/uL (ref 150–400)
RBC: 3.68 MIL/uL — ABNORMAL LOW (ref 3.87–5.11)
RDW: 13.4 % (ref 11.5–15.5)
WBC: 10.6 10*3/uL — ABNORMAL HIGH (ref 4.0–10.5)

## 2014-05-16 LAB — TYPE AND SCREEN
ABO/RH(D): A POS
Antibody Screen: NEGATIVE

## 2014-05-16 NOTE — Discharge Summary (Signed)
Physician Discharge Summary  [name] ACZ:[id number] DOB: April 30, 1927 DOA: 05/15/2014  PCP: Rachell Cipro, MD  Admit date: 05/15/2014 Discharge date: 05/16/2014  Time spent: 35 minutes  Recommendations for Outpatient Follow-up:  1. Patient admitted for painless hematochezia, her Louanna Raw was discontinued and will follow up with her cardiologist next week to discuss restarting this medication.  2. Repeat CBC in 1 week  Discharge Diagnoses:  Principal Problem:   Lower GI bleed Active Problems:   Left leg cellulitis   Atrial fibrillation   Aortic stenosis   HTN (hypertension)   Rectal bleeding   Discharge Condition: Stable  Diet recommendation: Heart Healthy  Filed Weights   05/15/14 1456  Weight: 66 kg (145 lb 8.1 oz)    History of present illness:  [name] is a 80 y.o. female with a past medical history of Atrial Fibrillation/Flutter who was started on Xarelto on 04/27/2014. She also has a history of left lower extremity cellulitis that resulted from a cat scratch, that lead to 2 hospitalizations on 04/01/2014 and 04/09/2014. She has been treated with multiple courses of antimicrobial therapy including Augmentin, Clindamycin and Doxycycline. She reported being in her usual state of health until she got up this morning and noticed bright red blood per rectum. She denies associated pain, melena, diarrhea, fevers, chills, nausea,vomiting, hematemesis, or syncope, however did report some dizziness. She dies prior episodes of GI bleed. She presented to the emergency room at [facility] where initial lab work showed Hg of 11.2, hemodynamically stable. She has no other complaints.   Hospital Course:  [name] is a 80 y.o. female with a past medical history of Atrial Fibrillation/Flutter who was started on Xarelto on 04/27/2014. She reported an episode of bright red blood per rectum on morning of admission. She denied dizziness, lightheadedness,  hematemesis, black tarry stools, syncope or presyncope. Initial  Labs showed stable Hg of 11.2. She was brought in for overnight observation. I spoke with Dr Claiborne Billings of Cardiology regarding her anticoagulation. He recommended stopping Xarelto for now and follow up with her cardiologist at the office next week to discuss anticoagulaton further. She did not have any further episodes of bright red blood per rectum. I suspect this may have been a hemorrhoidal bleed. Patient discharged in stable condition on 05/16/2014 to her home.     Consultations:  Telephone consult made to Cardiology  Discharge Exam: Filed Vitals:   05/16/14 0453  BP: 141/70  Pulse: 65  Temp: 98.9 F (37.2 C)  Resp: 16    General: She reports feeling well, in no acute distress, awake and alert Cardiovascular: Regular rate and rhythm, normal S1S2 Respiratory: Clear to auscultation, normal inspiratory effort Extremities: Left lower extremity erythema surrounding ankle is unchanged, patient feels this is much improved however.   Discharge Instructions You were cared for by a hospitalist during your hospital stay. If you have any questions about your discharge medications or the care you received while you were in the hospital after you are discharged, you can call the unit and asked to speak with the hospitalist on call if the hospitalist that took care of you is not available. Once you are discharged, your primary care physician will handle any further medical issues. Please note that NO REFILLS for any discharge medications will be authorized once you are discharged, as it is imperative that you return to your primary care physician (or establish a relationship with a primary care physician if you do not have one) for  your aftercare needs so that they can reassess your need for medications and monitor your lab values.  Discharge Instructions   Call MD for:  difficulty breathing, headache or visual disturbances    Complete by:   As directed      Call MD for:  extreme fatigue    Complete by:  As directed      Call MD for:  hives    Complete by:  As directed      Call MD for:  persistant dizziness or light-headedness    Complete by:  As directed      Call MD for:  persistant nausea and vomiting    Complete by:  As directed      Call MD for:  temperature >100.4    Complete by:  As directed      Diet - low sodium heart healthy    Complete by:  As directed      Discharge instructions    Complete by:  As directed   Please follow up with your cardiologist next week to discuss Xarelto therapy. For now do not take Xarelto until you see your cardiologist.     Increase activity slowly    Complete by:  As directed             Medication List    STOP taking these medications       Rivaroxaban 15 MG Tabs tablet  Commonly known as:  XARELTO      TAKE these medications       acetaminophen 500 MG tablet  Commonly known as:  TYLENOL  Take 1,000 mg by mouth every 6 (six) hours as needed (pain).     calcium carbonate 600 MG Tabs tablet  Commonly known as:  OS-CAL  Take 1,800 mg by mouth daily.     diclofenac sodium 1 % Gel  Commonly known as:  VOLTAREN  Apply 2 g topically 4 (four) times daily as needed (For pain).     dicyclomine 20 MG tablet  Commonly known as:  BENTYL  Take 20 mg by mouth every 6 (six) hours as needed. For stomach irritability.     estrogens (conjugated) 0.625 MG tablet  Commonly known as:  PREMARIN  Take 0.625 mg by mouth daily. Take daily for 21 days then do not take for 7 days.     ezetimibe-simvastatin 10-40 MG per tablet  Commonly known as:  VYTORIN  Take 1 tablet by mouth at bedtime.     Fish Oil 1000 MG Caps  Take 1 capsule by mouth 3 (three) times daily.     levothyroxine 75 MCG tablet  Commonly known as:  SYNTHROID, LEVOTHROID  Take 75 mcg by mouth daily.     medroxyPROGESTERone 2.5 MG tablet  Commonly known as:  PROVERA  Take 5 mg by mouth daily. For 10 days each  month     metoprolol tartrate 25 MG tablet  Commonly known as:  LOPRESSOR  Take 0.5 tablets (12.5 mg total) by mouth 2 (two) times daily.     olmesartan 40 MG tablet  Commonly known as:  BENICAR  Take 40 mg by mouth daily.     olopatadine 0.1 % ophthalmic solution  Commonly known as:  PATANOL  Place 1 drop into both eyes 2 (two) times daily.       Allergies  Allergen Reactions  . Aspirin Nausea And Vomiting  . Codeine Nausea And Vomiting    "Deathly Sick"  . Coreg [Carvedilol] Other (  See Comments)    Fatigue/ ill  . Penicillins Rash       Follow-up Information   Follow up with [facility], MD In 2 weeks.   Specialty:  Family Medicine   Contact information:   [address] [place] [address] [phone]       Follow up with Darden Amber., MD In 1 week.   Specialty:  Cardiology   Contact information:   [address] [place] [address] [phone]        The results of significant diagnostics from this hospitalization (including imaging, microbiology, ancillary and laboratory) are listed below for reference.    Significant Diagnostic Studies: No results found.  Microbiology: No results found for this or any previous visit (from the past 240 hour(s)).   Labs: Basic Metabolic Panel:  Recent Labs Lab 05/15/14 1049 05/16/14 0450  NA 136* 136*  K 4.5 4.3  CL 99 99  CO2 24 24  GLUCOSE 119* 109*  BUN 19 19  CREATININE 1.02 0.98  CALCIUM 9.4 9.1   Liver Function Tests:  Recent Labs Lab 05/15/14 1049  AST 32  ALT 15  ALKPHOS 84  BILITOT 0.4  PROT 7.0  ALBUMIN 2.8*   No results found for this basename: LIPASE, AMYLASE,  in the last 168 hours No results found for this basename: AMMONIA,  in the last 168 hours CBC:  Recent Labs Lab 05/15/14 1049 05/16/14 0450  WBC 10.2 10.6*  NEUTROABS 7.7  --   HGB 11.2* 11.0*  HCT 34.2* 33.3*  MCV 91.4 90.5  PLT 365 374   Cardiac Enzymes: No results found for  this basename: CKTOTAL, CKMB, CKMBINDEX, TROPONINI,  in the last 168 hours BNP: BNP (last 3 results) No results found for this basename: PROBNP,  in the last 8760 hours CBG: No results found for this basename: GLUCAP,  in the last 168 hours     Signed:  Kelvin Cellar  [facility] 05/16/2014, 10:26 AM

## 2014-05-18 ENCOUNTER — Ambulatory Visit: Payer: Medicare Other | Admitting: Internal Medicine

## 2014-05-27 ENCOUNTER — Telehealth: Payer: Self-pay | Admitting: *Deleted

## 2014-05-27 NOTE — Telephone Encounter (Signed)
[facility] to [facility] 

## 2014-06-07 ENCOUNTER — Other Ambulatory Visit: Payer: Self-pay | Admitting: Cardiovascular Disease

## 2014-06-12 ENCOUNTER — Ambulatory Visit: Payer: Medicare Other | Admitting: Internal Medicine

## 2014-06-15 ENCOUNTER — Ambulatory Visit (INDEPENDENT_AMBULATORY_CARE_PROVIDER_SITE_OTHER): Payer: Medicare Other | Admitting: Nurse Practitioner

## 2014-06-15 ENCOUNTER — Encounter: Payer: Self-pay | Admitting: Nurse Practitioner

## 2014-06-15 ENCOUNTER — Encounter: Payer: Self-pay | Admitting: Internal Medicine

## 2014-06-15 VITALS — BP 130/60 | HR 81 | Ht 60.0 in | Wt 150.1 lb

## 2014-06-15 DIAGNOSIS — K2971 Gastritis, unspecified, with bleeding: Secondary | ICD-10-CM | POA: Diagnosis not present

## 2014-06-15 DIAGNOSIS — E785 Hyperlipidemia, unspecified: Secondary | ICD-10-CM

## 2014-06-15 DIAGNOSIS — I4891 Unspecified atrial fibrillation: Secondary | ICD-10-CM | POA: Diagnosis not present

## 2014-06-15 DIAGNOSIS — K2991 Gastroduodenitis, unspecified, with bleeding: Secondary | ICD-10-CM | POA: Diagnosis not present

## 2014-06-15 DIAGNOSIS — I480 Paroxysmal atrial fibrillation: Secondary | ICD-10-CM

## 2014-06-15 DIAGNOSIS — I6529 Occlusion and stenosis of unspecified carotid artery: Secondary | ICD-10-CM

## 2014-06-15 DIAGNOSIS — R609 Edema, unspecified: Secondary | ICD-10-CM

## 2014-06-15 DIAGNOSIS — I10 Essential (primary) hypertension: Secondary | ICD-10-CM | POA: Diagnosis not present

## 2014-06-15 LAB — BASIC METABOLIC PANEL
BUN: 27 mg/dL — ABNORMAL HIGH (ref 6–23)
CO2: 25 mEq/L (ref 19–32)
Calcium: 9.2 mg/dL (ref 8.4–10.5)
Chloride: 102 mEq/L (ref 96–112)
Creatinine, Ser: 1.1 mg/dL (ref 0.4–1.2)
GFR: 50.18 mL/min — ABNORMAL LOW (ref >60.00)
Glucose, Bld: 116 mg/dL — ABNORMAL HIGH (ref 70–99)
Potassium: 4.6 mEq/L (ref 3.5–5.1)
Sodium: 137 mEq/L (ref 135–145)

## 2014-06-15 LAB — CBC
HCT: 36.1 % (ref 36.0–46.0)
Hemoglobin: 11.9 g/dL — ABNORMAL LOW (ref 12.0–15.0)
MCHC: 33.1 g/dL (ref 30.0–36.0)
MCV: 89.8 fl (ref 78.0–100.0)
Platelets: 355 10*3/uL (ref 150.0–400.0)
RBC: 4.02 Mil/uL (ref 3.87–5.11)
RDW: 15 % (ref 11.5–15.5)
WBC: 12.9 10*3/uL — ABNORMAL HIGH (ref 4.0–10.5)

## 2014-06-15 MED ORDER — LOSARTAN POTASSIUM 100 MG PO TABS: 100.0000 mg | ORAL_TABLET | Freq: Every day | ORAL | Status: DC

## 2014-06-15 NOTE — Patient Instructions (Addendum)
Stay on your current medicines but I am adding aspirin - 81 mg  We will use Losartan 100 mg a day in the place of Benicar - this should be cheaper.  We will refer you to GI - this is to discuss the bleeding that you had a few weeks ago and the need to be put back on anticoagulation (blood thinner)  See Dr. Acie Fredrickson in October as planned   Call the [place] office at [phone] if you have any questions, problems or concerns.

## 2014-06-15 NOTE — Progress Notes (Signed)
[place] Date of Birth: [birth year] Medical Record #[id number]  History of Present Illness: [name] is seen back today for a follow up visit. Seen for Dr. Acie Fredrickson. She has HTN, chronic leg edema, mild AS and HLD. EF is normal at 55 to 60% from June 2013.   Admitted back in March of 2015 with a syncopal spell. Negative evaluation at that time.  I last saw her in April of this year. She has had several admissions since that time - has had cellulitis from a cat scratch which was quite resistant to treatment. Has had AF noted in Dr. Ival Bible office - given coumadin - subsequently changed to [place]. Only able to tolerate low dose beta blocker for her heart rates.   Last seen in June of 2014 by Dr. Acie Fredrickson. He encouraged her to stay on the low dose Metoprolol. She was seen back in the ER several days after that last visit with GI bleeding. Xarelto was stopped. She is currently on NO anticoagulation at this time.   Comes in today. Here alone. Doing ok. Says she is feeling ok. No palpitations. Not bleeding. Feels pretty good by her description. Tells me that she has not seen Dr. Ernie Hew "in a long time". Seeing Dr. Larose Kells for primary care. History is very confusing to put together. Has NEVER had colonoscopy. Can't afford Benicar.   Current Outpatient Prescriptions  Medication Sig Dispense Refill  . acetaminophen (TYLENOL) 500 MG tablet Take 1,000 mg by mouth every 6 (six) hours as needed (pain).       . BENICAR 40 MG tablet TAKE ONE TABLET BY MOUTH ONCE DAILY  30 tablet  0  . calcium carbonate (OS-CAL) 600 MG TABS Take 1,800 mg by mouth daily.      . diclofenac sodium (VOLTAREN) 1 % GEL Apply 2 g topically 4 (four) times daily as needed (For pain).      Marland Kitchen dicyclomine (BENTYL) 20 MG tablet Take 20 mg by mouth every 6 (six) hours as needed. For stomach irritability.      Marland Kitchen estrogens, conjugated, (PREMARIN) 0.625 MG tablet Take 0.625 mg by mouth daily. Take daily for 21 days then do not take for 7  days.      Marland Kitchen ezetimibe-simvastatin (VYTORIN) 10-40 MG per tablet Take 1 tablet by mouth at bedtime.      Marland Kitchen levothyroxine (SYNTHROID, LEVOTHROID) 75 MCG tablet Take 75 mcg by mouth daily.        . medroxyPROGESTERone (PROVERA) 2.5 MG tablet Take 5 mg by mouth daily. For 10 days each month      . metoprolol tartrate (LOPRESSOR) 25 MG tablet Take 0.5 tablets (12.5 mg total) by mouth 2 (two) times daily.  90 tablet  3  . olopatadine (PATANOL) 0.1 % ophthalmic solution Place 1 drop into both eyes 2 (two) times daily.      . Omega-3 Fatty Acids (FISH OIL) 1000 MG CAPS Take 1 capsule by mouth 3 (three) times daily.      . [DISCONTINUED] losartan-hydrochlorothiazide (HYZAAR) 100-12.5 MG per tablet Take 1 tablet by mouth daily.  30 tablet  11   No current facility-administered medications for this visit.    Allergies  Allergen Reactions  . Aspirin Nausea And Vomiting  . Codeine Nausea And Vomiting    "Deathly Sick"  . Coreg [Carvedilol] Other (See Comments)    Fatigue/ ill  . Penicillins Rash    Past Medical History  Diagnosis Date  . HTN (hypertension)   .  HLD (hyperlipidemia)   . Hypothyroidism   . Aortic stenosis, moderate     last echo in 2010; AVA 1.1; mild AS per echo in June 2013  . Edema of both legs     s/p laser treatment per Dr. Donnetta Hutching  . A-fib     Past Surgical History  Procedure Laterality Date  . Appendectomy  1955  . Foot surgery  2003    bilateral Hammer Toe  . Eye surgery  2005    Bilateral cataract  . Endovenous ablation saphenous vein w/ laser  08-29-2012    left greater saphenous vein   Curt Jews MD  . Endovenous ablation saphenous vein w/ laser  09-19-2012    right greater saphenous vein by Curt Jews MD  . Stab phlebectomy Right 01-02-2013    10-15 incisions right thigh and calf by Curt Jews MD    History  Smoking status  . Former Smoker  . Types: Cigarettes  . Quit date: 10/31/1983  Smokeless tobacco  . Never Used    History  Alcohol Use No     Family History  Problem Relation Age of Onset  . Peripheral vascular disease    . Heart disease Mother   . Peripheral vascular disease Mother   . Heart disease Father   . Peripheral vascular disease Father     Right leg amputation  . Hypertension Sister   . Heart disease Brother 59    Heart Disease before age 67    Review of Systems: The review of systems is per the HPI.  All other systems were reviewed and are negative.  Physical Exam: Ht 5' (1.524 m)  Wt 150 lb 1.9 oz (68.094 kg)  BMI 29.32 kg/m2 Patient is pleasant and in no acute distress. Skin is warm and dry. Color is normal.  HEENT is unremarkable. Normocephalic/atraumatic. PERRL. Sclera are nonicteric. Neck is supple. No masses. No JVD. Lungs are clear. Cardiac exam shows a regular rate and rhythm today. Abdomen is soft. Extremities are with trace edema. Gait and ROM are intact. No gross neurologic deficits noted.  Wt Readings from Last 3 Encounters:  06/15/14 150 lb 1.9 oz (68.094 kg)  05/15/14 145 lb 8.1 oz (66 kg)  05/11/14 145 lb 6.4 oz (65.953 kg)    LABORATORY DATA/PROCEDURES: EKG today shows sinus rhythm.  Labs pending  Lab Results  Component Value Date   WBC 10.6* 05/16/2014   HGB 11.0* 05/16/2014   HCT 33.3* 05/16/2014   PLT 374 05/16/2014   GLUCOSE 109* 05/16/2014   CHOL 175 09/30/2012   TRIG 130.0 09/30/2012   HDL 69.30 09/30/2012   LDLDIRECT 164.2 03/22/2011   LDLCALC 80 09/30/2012   ALT 15 05/15/2014   AST 32 05/15/2014   NA 136* 05/16/2014   K 4.3 05/16/2014   CL 99 05/16/2014   CREATININE 0.98 05/16/2014   BUN 19 05/16/2014   CO2 24 05/16/2014   TSH 1.780 05/15/2014   INR 1.47 05/15/2014    BNP (last 3 results) No results found for this basename: PROBNP,  in the last 8760 hours   Echo Study Conclusions from March 2015  - Left ventricle: The cavity size was normal. Wall thickness was increased in a pattern of mild LVH. Systolic function was normal. The estimated ejection fraction was in  the range of 60% to 65%. Wall motion was normal; there were no regional wall motion abnormalities. The study is not technically sufficient to allow evaluation of LV diastolic function. - Aortic valve:  There was very mild stenosis. Valve area: 1.85cm^2(VTI). Valve area: 1.58cm^2 (Vmax).  Assessment / Plan:  1. HTN - BP ok - not going to be able to afford Benicar - I am switching to Losartan 100 mg a day  2. Chronic lower extremity edema   3. PAF - not on anticoagulation due to recent episode of GI bleeding. Never had colonoscopy in her lifetime. Discussed with Dr. Acie Fredrickson - will refer to GI for their input. I am restarting baby aspirin. See back as planned. We will recheck her labs today as well.  Patient is agreeable to this plan and will call if any problems develop in the interim.   Burtis Junes, RN, [place] [address] [place], [place]  [address] [phone]

## 2014-06-30 DIAGNOSIS — I10 Essential (primary) hypertension: Secondary | ICD-10-CM | POA: Diagnosis not present

## 2014-06-30 DIAGNOSIS — E039 Hypothyroidism, unspecified: Secondary | ICD-10-CM | POA: Diagnosis not present

## 2014-06-30 DIAGNOSIS — K589 Irritable bowel syndrome without diarrhea: Secondary | ICD-10-CM | POA: Diagnosis not present

## 2014-06-30 DIAGNOSIS — E785 Hyperlipidemia, unspecified: Secondary | ICD-10-CM | POA: Diagnosis not present

## 2014-06-30 DIAGNOSIS — I872 Venous insufficiency (chronic) (peripheral): Secondary | ICD-10-CM | POA: Diagnosis not present

## 2014-06-30 DIAGNOSIS — I4891 Unspecified atrial fibrillation: Secondary | ICD-10-CM | POA: Diagnosis not present

## 2014-06-30 DIAGNOSIS — N926 Irregular menstruation, unspecified: Secondary | ICD-10-CM | POA: Diagnosis not present

## 2014-06-30 DIAGNOSIS — Z1331 Encounter for screening for depression: Secondary | ICD-10-CM | POA: Diagnosis not present

## 2014-07-02 ENCOUNTER — Emergency Department (HOSPITAL_COMMUNITY): Payer: Medicare Other

## 2014-07-02 ENCOUNTER — Inpatient Hospital Stay (HOSPITAL_COMMUNITY)
Admission: EM | Admit: 2014-07-02 | Discharge: 2014-07-04 | DRG: 310 | Disposition: A | Discharge status: Home / Self Care | Payer: Medicare Other | Attending: Cardiovascular Disease | Admitting: Cardiovascular Disease

## 2014-07-02 ENCOUNTER — Encounter (HOSPITAL_COMMUNITY): Payer: Self-pay | Admitting: Emergency Medicine

## 2014-07-02 DIAGNOSIS — E785 Hyperlipidemia, unspecified: Secondary | ICD-10-CM | POA: Diagnosis present

## 2014-07-02 DIAGNOSIS — Z7982 Long term (current) use of aspirin: Secondary | ICD-10-CM | POA: Diagnosis not present

## 2014-07-02 DIAGNOSIS — E039 Hypothyroidism, unspecified: Secondary | ICD-10-CM | POA: Diagnosis not present

## 2014-07-02 DIAGNOSIS — I4719 Other supraventricular tachycardia: Secondary | ICD-10-CM | POA: Diagnosis present

## 2014-07-02 DIAGNOSIS — Z886 Allergy status to analgesic agent status: Secondary | ICD-10-CM

## 2014-07-02 DIAGNOSIS — R002 Palpitations: Secondary | ICD-10-CM | POA: Diagnosis not present

## 2014-07-02 DIAGNOSIS — I471 Supraventricular tachycardia, unspecified: Secondary | ICD-10-CM | POA: Diagnosis not present

## 2014-07-02 DIAGNOSIS — I4891 Unspecified atrial fibrillation: Secondary | ICD-10-CM | POA: Diagnosis not present

## 2014-07-02 DIAGNOSIS — Z8249 Family history of ischemic heart disease and other diseases of the circulatory system: Secondary | ICD-10-CM

## 2014-07-02 DIAGNOSIS — Z87891 Personal history of nicotine dependence: Secondary | ICD-10-CM

## 2014-07-02 DIAGNOSIS — I359 Nonrheumatic aortic valve disorder, unspecified: Secondary | ICD-10-CM | POA: Diagnosis present

## 2014-07-02 DIAGNOSIS — Z888 Allergy status to other drugs, medicaments and biological substances status: Secondary | ICD-10-CM

## 2014-07-02 DIAGNOSIS — Z880 Allergy status to penicillin: Secondary | ICD-10-CM

## 2014-07-02 DIAGNOSIS — K922 Gastrointestinal hemorrhage, unspecified: Secondary | ICD-10-CM | POA: Diagnosis present

## 2014-07-02 DIAGNOSIS — Z602 Problems related to living alone: Secondary | ICD-10-CM | POA: Diagnosis not present

## 2014-07-02 DIAGNOSIS — I10 Essential (primary) hypertension: Secondary | ICD-10-CM | POA: Diagnosis not present

## 2014-07-02 DIAGNOSIS — I482 Chronic atrial fibrillation, unspecified: Secondary | ICD-10-CM | POA: Diagnosis present

## 2014-07-02 DIAGNOSIS — Z79899 Other long term (current) drug therapy: Secondary | ICD-10-CM | POA: Diagnosis not present

## 2014-07-02 DIAGNOSIS — I4819 Other persistent atrial fibrillation: Secondary | ICD-10-CM | POA: Diagnosis present

## 2014-07-02 DIAGNOSIS — R6889 Other general symptoms and signs: Secondary | ICD-10-CM | POA: Diagnosis not present

## 2014-07-02 DIAGNOSIS — I350 Nonrheumatic aortic (valve) stenosis: Secondary | ICD-10-CM | POA: Diagnosis present

## 2014-07-02 LAB — BASIC METABOLIC PANEL
Anion gap: 12 (ref 5–15)
BUN: 32 mg/dL — ABNORMAL HIGH (ref 6–23)
CO2: 26 mEq/L (ref 19–32)
Calcium: 9.1 mg/dL (ref 8.4–10.5)
Chloride: 99 mEq/L (ref 96–112)
Creatinine, Ser: 1.18 mg/dL — ABNORMAL HIGH (ref 0.50–1.10)
GFR calc Af Amer: 47 mL/min — ABNORMAL LOW (ref >90)
GFR calc non Af Amer: 41 mL/min — ABNORMAL LOW (ref >90)
Glucose, Bld: 145 mg/dL — ABNORMAL HIGH (ref 70–99)
Potassium: 4.7 mEq/L (ref 3.7–5.3)
Sodium: 137 mEq/L (ref 137–147)

## 2014-07-02 LAB — CBC
HCT: 37.6 % (ref 36.0–46.0)
Hemoglobin: 12.5 g/dL (ref 12.0–15.0)
MCH: 29.8 pg (ref 26.0–34.0)
MCHC: 33.2 g/dL (ref 30.0–36.0)
MCV: 89.7 fL (ref 78.0–100.0)
Platelets: 311 10*3/uL (ref 150–400)
RBC: 4.19 MIL/uL (ref 3.87–5.11)
RDW: 14.9 % (ref 11.5–15.5)
WBC: 11.5 10*3/uL — ABNORMAL HIGH (ref 4.0–10.5)

## 2014-07-02 LAB — I-STAT TROPONIN, ED: Troponin i, poc: 0.04 ng/mL (ref 0.00–0.08)

## 2014-07-02 MED ORDER — DILTIAZEM LOAD VIA INFUSION
10.0000 mg | Freq: Once | INTRAVENOUS | Status: AC
  Administered 2014-07-02: 10 mg via INTRAVENOUS
  Filled 2014-07-02: qty 10

## 2014-07-02 MED ORDER — DILTIAZEM HCL 100 MG IV SOLR
5.0000 mg/h | INTRAVENOUS | Status: DC
  Administered 2014-07-02: 5 mg/h via INTRAVENOUS

## 2014-07-02 MED ORDER — SODIUM CHLORIDE 0.9 % IV BOLUS (SEPSIS)
1000.0000 mL | Freq: Once | INTRAVENOUS | Status: AC
Start: 2014-07-02 — End: 2014-07-02
  Administered 2014-07-02: 1000 mL via INTRAVENOUS

## 2014-07-02 NOTE — ED Notes (Signed)
Pt c/o palpitations, worsening tonight. Pt reports recent hx of a-fib and was prescribed medication for afib; has not been taking medication for a-fib, reports feeling weak when taking medication. Pt reports constant palpitations worsening tonight associated with increased shortness of breath. Denies pain at this time

## 2014-07-02 NOTE — ED Provider Notes (Addendum)
CSN: [id number]     Arrival date & time 07/02/14  2156 History   First MD Initiated Contact with Patient 07/02/14 2257     Chief Complaint  Patient presents with  . Palpitations     (Consider location/radiation/quality/duration/timing/severity/associated sxs/prior Treatment) HPI 80 year old female presents to emergency room with complaint of shortness of breath and had palpitations.  Symptoms started around 6:00 tonight.  Patient has history of paroxysmal A. fib.  She has not been taking her metoprolol as it does not agree with her.  Patient reports waking tonight around midnight with a sense of dread and nightmares, but denies any palpitations at that time.  She reports she actually does not feel a racing heart rate, and his shortness of breath caused her to come in.  Patient noted to be in A. fib with RVR.  She denies any chest pain.  Patient reports that beta blockers make her feel tired and fatigued and she will not take them.  Reviewing notes, she was also tried on verapamil but she was noncompliant with.  There is mention of possibly starting digoxin. Past Medical History  Diagnosis Date  . HTN (hypertension)   . HLD (hyperlipidemia)   . Hypothyroidism   . Aortic stenosis, moderate     last echo in 2010; AVA 1.1; mild AS per echo in June 2013  . Edema of both legs     s/p laser treatment per Dr. Donnetta Hutching  . A-fib    Past Surgical History  Procedure Laterality Date  . Appendectomy  1955  . Foot surgery  2003    bilateral Hammer Toe  . Eye surgery  2005    Bilateral cataract  . Endovenous ablation saphenous vein w/ laser  08-29-2012    left greater saphenous vein   Curt Jews MD  . Endovenous ablation saphenous vein w/ laser  09-19-2012    right greater saphenous vein by Curt Jews MD  . Stab phlebectomy Right 01-02-2013    10-15 incisions right thigh and calf by Curt Jews MD   Family History  Problem Relation Age of Onset  . Peripheral vascular disease    . Heart disease  Mother   . Peripheral vascular disease Mother   . Heart disease Father   . Peripheral vascular disease Father     Right leg amputation  . Hypertension Sister   . Heart disease Brother 47    Heart Disease before age 70   History  Substance Use Topics  . Smoking status: Former Smoker    Types: Cigarettes    Quit date: 10/31/1983  . Smokeless tobacco: Never Used  . Alcohol Use: No   OB History   Grav Para Term Preterm Abortions TAB SAB Ect Mult Living                 Review of Systems   See History of Present Illness; otherwise all other systems are reviewed and negative  Allergies  Aspirin; Codeine; Coreg; and Penicillins  Home Medications   Prior to Admission medications   Medication Sig Start Date End Date Taking? Authorizing Provider  acetaminophen (TYLENOL) 500 MG tablet Take 1,000 mg by mouth every 6 (six) hours as needed (pain).    Yes Historical Provider, MD  aspirin 81 MG tablet Take 81 mg by mouth daily.   Yes Historical Provider, MD  calcium carbonate (OS-CAL) 600 MG TABS Take 1,800 mg by mouth daily.   Yes Historical Provider, MD  cyanocobalamin 500 MCG tablet Take 500  mcg by mouth daily.   Yes Historical Provider, MD  diclofenac sodium (VOLTAREN) 1 % GEL Apply 2 g topically 4 (four) times daily as needed (For pain).   Yes Historical Provider, MD  dicyclomine (BENTYL) 20 MG tablet Take 10 mg by mouth every 6 (six) hours as needed for spasms. For stomach irritability.   Yes Historical Provider, MD  estrogens, conjugated, (PREMARIN) 0.625 MG tablet Take 0.625 mg by mouth daily. Take daily for 21 days then do not take for 7 days.   Yes Historical Provider, MD  ezetimibe-simvastatin (VYTORIN) 10-40 MG per tablet Take 1 tablet by mouth at bedtime.   Yes Historical Provider, MD  levothyroxine (SYNTHROID, LEVOTHROID) 75 MCG tablet Take 75 mcg by mouth daily.     Yes Historical Provider, MD  medroxyPROGESTERone (PROVERA) 2.5 MG tablet Take 5 mg by mouth daily. For 10 days  each month   Yes Historical Provider, MD  Multiple Vitamins-Minerals (PRESERVISION AREDS 2) CAPS Take 1 capsule by mouth 2 (two) times daily.   Yes Historical Provider, MD  olmesartan (BENICAR) 40 MG tablet Take 40 mg by mouth daily.   Yes Historical Provider, MD  olopatadine (PATANOL) 0.1 % ophthalmic solution Place 1 drop into both eyes 2 (two) times daily.   Yes Historical Provider, MD  Omega-3 Fatty Acids (FISH OIL) 1000 MG CAPS Take 1 capsule by mouth 3 (three) times daily.   Yes Historical Provider, MD   BP 113/60  Pulse 76  Resp 26  SpO2 99% Physical Exam  Vitals reviewed. Constitutional: She is oriented to person, place, and time. She appears well-developed and well-nourished.  HENT:  Head: Normocephalic and atraumatic.  Right Ear: External ear normal.  Left Ear: External ear normal.  Nose: Nose normal.  Mouth/Throat: Oropharynx is clear and moist.  Eyes: Conjunctivae and EOM are normal. Pupils are equal, round, and reactive to light.  Neck: Normal range of motion. Neck supple. No JVD present. No tracheal deviation present. No thyromegaly present.  Cardiovascular: Normal heart sounds and intact distal pulses.  Exam reveals no gallop and no friction rub.   No murmur heard. tachycardia, irregular irregular  Pulmonary/Chest: Effort normal and breath sounds normal. No stridor. No respiratory distress. She has no wheezes. She has no rales. She exhibits no tenderness.  Abdominal: Soft. Bowel sounds are normal. She exhibits no distension and no mass. There is no tenderness. There is no rebound and no guarding.  Musculoskeletal: Normal range of motion. She exhibits no edema and no tenderness.  Lymphadenopathy:    She has no cervical adenopathy.  Neurological: She is alert and oriented to person, place, and time. She has normal reflexes. No cranial nerve deficit. She exhibits normal muscle tone. Coordination normal.  Skin: Skin is warm and dry. No rash noted. No erythema. No pallor.   Psychiatric: She has a normal mood and affect. Her behavior is normal. Judgment and thought content normal.    ED Course  Procedures (including critical care time) Labs Review Labs Reviewed  CBC - Abnormal; Notable for the following:    WBC 11.5 (*)    All other components within normal limits  BASIC METABOLIC PANEL - Abnormal; Notable for the following:    Glucose, Bld 145 (*)    BUN 32 (*)    Creatinine, Ser 1.18 (*)    GFR calc non Af Amer 41 (*)    GFR calc Af Amer 47 (*)    All other components within normal limits  Randolm Idol, ED  Imaging Review No results found.   EKG Interpretation   Date/Time:  Thursday July 02 2014 22:23:40 EDT Ventricular Rate:  136 PR Interval:    QRS Duration: 76 QT Interval:  302 QTC Calculation: 454 R Axis:   79 Text Interpretation:  Atrial fibrillation Low voltage, precordial leads  Minimal ST depression, inferior leads afib with rvr Confirmed by Yanelly Cantrelle   MD, Dalila Arca ([id number]) on 07/02/2014 11:02:30 PM     CRITICAL CARE Performed by: Kalman Drape Total critical care time:60 min Critical care time was exclusive of separately billable procedures and treating other patients. Critical care was necessary to treat or prevent imminent or life-threatening deterioration. Critical care was time spent personally by me on the following activities: development of treatment plan with patient and/or surrogate as well as nursing, discussions with consultants, evaluation of patient's response to treatment, examination of patient, obtaining history from patient or surrogate, ordering and performing treatments and interventions, ordering and review of laboratory studies, ordering and review of radiographic studies, pulse oximetry and re-evaluation of patient's condition.  MDM   Final diagnoses:  Atrial fibrillation with rapid ventricular response    80 year old female with paroxysmal A. fib.  Blood pressures are slightly soft.  We'll give fluid  bolus prior to diltiazem.  We'll talk with cardiology for admission for management of her medications.  Kalman Drape, MD 07/03/14 [place], MD 07/03/14 [phone]

## 2014-07-02 NOTE — ED Notes (Signed)
Pt to ED from home via [facility] c/o palpitations. Recently diagnosed with afib, refuses to take afib medication at home due to "it makes me feel terrible." Pt denies shortness of breath, chest pain, or NV. Pt took ASA at home

## 2014-07-03 DIAGNOSIS — Z87891 Personal history of nicotine dependence: Secondary | ICD-10-CM | POA: Diagnosis not present

## 2014-07-03 DIAGNOSIS — E039 Hypothyroidism, unspecified: Secondary | ICD-10-CM | POA: Diagnosis not present

## 2014-07-03 DIAGNOSIS — Z7982 Long term (current) use of aspirin: Secondary | ICD-10-CM | POA: Diagnosis not present

## 2014-07-03 DIAGNOSIS — Z886 Allergy status to analgesic agent status: Secondary | ICD-10-CM | POA: Diagnosis not present

## 2014-07-03 DIAGNOSIS — E785 Hyperlipidemia, unspecified: Secondary | ICD-10-CM | POA: Diagnosis present

## 2014-07-03 DIAGNOSIS — Z602 Problems related to living alone: Secondary | ICD-10-CM | POA: Diagnosis not present

## 2014-07-03 DIAGNOSIS — R002 Palpitations: Secondary | ICD-10-CM | POA: Diagnosis present

## 2014-07-03 DIAGNOSIS — I359 Nonrheumatic aortic valve disorder, unspecified: Secondary | ICD-10-CM | POA: Diagnosis not present

## 2014-07-03 DIAGNOSIS — Z880 Allergy status to penicillin: Secondary | ICD-10-CM | POA: Diagnosis not present

## 2014-07-03 DIAGNOSIS — I10 Essential (primary) hypertension: Secondary | ICD-10-CM | POA: Diagnosis not present

## 2014-07-03 DIAGNOSIS — I4891 Unspecified atrial fibrillation: Secondary | ICD-10-CM | POA: Diagnosis not present

## 2014-07-03 DIAGNOSIS — Z888 Allergy status to other drugs, medicaments and biological substances status: Secondary | ICD-10-CM | POA: Diagnosis not present

## 2014-07-03 DIAGNOSIS — Z8249 Family history of ischemic heart disease and other diseases of the circulatory system: Secondary | ICD-10-CM | POA: Diagnosis not present

## 2014-07-03 DIAGNOSIS — Z79899 Other long term (current) drug therapy: Secondary | ICD-10-CM | POA: Diagnosis not present

## 2014-07-03 DIAGNOSIS — I471 Supraventricular tachycardia: Secondary | ICD-10-CM | POA: Diagnosis not present

## 2014-07-03 LAB — CBC
HCT: 37.1 % (ref 36.0–46.0)
Hemoglobin: 11.9 g/dL — ABNORMAL LOW (ref 12.0–15.0)
MCH: 29 pg (ref 26.0–34.0)
MCHC: 32.1 g/dL (ref 30.0–36.0)
MCV: 90.3 fL (ref 78.0–100.0)
Platelets: 295 10*3/uL (ref 150–400)
RBC: 4.11 MIL/uL (ref 3.87–5.11)
RDW: 14.9 % (ref 11.5–15.5)
WBC: 10.6 10*3/uL — ABNORMAL HIGH (ref 4.0–10.5)

## 2014-07-03 LAB — URINALYSIS, ROUTINE W REFLEX MICROSCOPIC
Bilirubin Urine: NEGATIVE
Glucose, UA: NEGATIVE mg/dL
Hgb urine dipstick: NEGATIVE
Ketones, ur: NEGATIVE mg/dL
Nitrite: NEGATIVE
Protein, ur: NEGATIVE mg/dL
Specific Gravity, Urine: 1.014 (ref 1.005–1.030)
Urobilinogen, UA: 0.2 mg/dL (ref 0.0–1.0)
pH: 5.5 (ref 5.0–8.0)

## 2014-07-03 LAB — BASIC METABOLIC PANEL
Anion gap: 11 (ref 5–15)
BUN: 28 mg/dL — ABNORMAL HIGH (ref 6–23)
CO2: 25 mEq/L (ref 19–32)
Calcium: 8.6 mg/dL (ref 8.4–10.5)
Chloride: 101 mEq/L (ref 96–112)
Creatinine, Ser: 1.14 mg/dL — ABNORMAL HIGH (ref 0.50–1.10)
GFR calc Af Amer: 49 mL/min — ABNORMAL LOW (ref >90)
GFR calc non Af Amer: 43 mL/min — ABNORMAL LOW (ref >90)
Glucose, Bld: 144 mg/dL — ABNORMAL HIGH (ref 70–99)
Potassium: 4.7 mEq/L (ref 3.7–5.3)
Sodium: 137 mEq/L (ref 137–147)

## 2014-07-03 LAB — HEPARIN LEVEL (UNFRACTIONATED): Heparin Unfractionated: 0.19 IU/mL — ABNORMAL LOW (ref 0.30–0.70)

## 2014-07-03 LAB — URINE MICROSCOPIC-ADD ON

## 2014-07-03 MED ORDER — HEPARIN (PORCINE) IN NACL 100-0.45 UNIT/ML-% IJ SOLN
800.0000 [IU]/h | INTRAMUSCULAR | Status: DC
  Administered 2014-07-03 (×2): 800 [IU]/h via INTRAVENOUS
  Filled 2014-07-03: qty 250

## 2014-07-03 MED ORDER — DICYCLOMINE HCL 10 MG PO CAPS
10.0000 mg | ORAL_CAPSULE | Freq: Four times a day (QID) | ORAL | Status: DC | PRN
  Administered 2014-07-03: 10 mg via ORAL
  Filled 2014-07-03 (×2): qty 1

## 2014-07-03 MED ORDER — OLOPATADINE HCL 0.1 % OP SOLN
1.0000 [drp] | Freq: Two times a day (BID) | OPHTHALMIC | Status: DC
  Administered 2014-07-03 – 2014-07-04 (×4): 1 [drp] via OPHTHALMIC
  Filled 2014-07-03: qty 5

## 2014-07-03 MED ORDER — ACETAMINOPHEN 500 MG PO TABS: 1000.0000 mg | ORAL_TABLET | Freq: Four times a day (QID) | ORAL | Status: DC | PRN

## 2014-07-03 MED ORDER — DILTIAZEM HCL ER COATED BEADS 240 MG PO CP24
240.0000 mg | ORAL_CAPSULE | Freq: Every day | ORAL | Status: DC
  Administered 2014-07-03 – 2014-07-04 (×2): 240 mg via ORAL
  Filled 2014-07-03 (×2): qty 1

## 2014-07-03 MED ORDER — DILTIAZEM HCL 100 MG IV SOLR
5.0000 mg/h | INTRAVENOUS | Status: DC
  Administered 2014-07-03: 10 mg/h via INTRAVENOUS
  Filled 2014-07-03: qty 100

## 2014-07-03 MED ORDER — ACETAMINOPHEN 325 MG PO TABS
650.0000 mg | ORAL_TABLET | ORAL | Status: DC | PRN
  Administered 2014-07-03 – 2014-07-04 (×2): 650 mg via ORAL
  Filled 2014-07-03 (×2): qty 2

## 2014-07-03 MED ORDER — OFF THE BEAT BOOK
Freq: Once | Status: AC
  Administered 2014-07-03: 07:00:00
  Filled 2014-07-03: qty 1

## 2014-07-03 MED ORDER — EZETIMIBE-SIMVASTATIN 10-40 MG PO TABS
1.0000 | ORAL_TABLET | Freq: Every day | ORAL | Status: DC
  Administered 2014-07-03 (×2): 1 via ORAL
  Filled 2014-07-03 (×3): qty 1

## 2014-07-03 MED ORDER — DICYCLOMINE HCL 20 MG PO TABS
10.0000 mg | ORAL_TABLET | Freq: Four times a day (QID) | ORAL | Status: DC | PRN
  Filled 2014-07-03: qty 1

## 2014-07-03 MED ORDER — LEVOTHYROXINE SODIUM 75 MCG PO TABS
75.0000 ug | ORAL_TABLET | Freq: Every day | ORAL | Status: DC
Start: 2014-07-03 — End: 2014-07-04
  Administered 2014-07-03 – 2014-07-04 (×2): 75 ug via ORAL
  Filled 2014-07-03 (×3): qty 1

## 2014-07-03 MED ORDER — ASPIRIN EC 81 MG PO TBEC
81.0000 mg | DELAYED_RELEASE_TABLET | Freq: Every day | ORAL | Status: DC
  Administered 2014-07-03 – 2014-07-04 (×2): 81 mg via ORAL
  Filled 2014-07-03 (×2): qty 1

## 2014-07-03 MED ORDER — ESTROGENS CONJUGATED 0.625 MG PO TABS
0.6250 mg | ORAL_TABLET | Freq: Every day | ORAL | Status: DC
  Administered 2014-07-03 – 2014-07-04 (×2): 0.625 mg via ORAL
  Filled 2014-07-03 (×2): qty 1

## 2014-07-03 MED ORDER — ONDANSETRON HCL 4 MG/2ML IJ SOLN: 4.0000 mg | Freq: Four times a day (QID) | INTRAMUSCULAR | Status: DC | PRN

## 2014-07-03 MED ORDER — CYANOCOBALAMIN 500 MCG PO TABS
500.0000 ug | ORAL_TABLET | Freq: Every day | ORAL | Status: DC
  Administered 2014-07-03: 500 ug via ORAL
  Administered 2014-07-04: 10:00:00 via ORAL
  Filled 2014-07-03 (×2): qty 1

## 2014-07-03 MED ORDER — MEDROXYPROGESTERONE ACETATE 5 MG PO TABS: 5.0000 mg | ORAL_TABLET | Freq: Every day | ORAL | Status: DC

## 2014-07-03 MED ORDER — HEPARIN (PORCINE) IN NACL 100-0.45 UNIT/ML-% IJ SOLN
1100.0000 [IU]/h | INTRAMUSCULAR | Status: DC
  Administered 2014-07-03 – 2014-07-04 (×2): 1100 [IU]/h via INTRAVENOUS
  Filled 2014-07-03 (×3): qty 250

## 2014-07-03 MED ORDER — IRBESARTAN 300 MG PO TABS
300.0000 mg | ORAL_TABLET | Freq: Every day | ORAL | Status: DC
  Filled 2014-07-03: qty 1

## 2014-07-03 MED ORDER — HEPARIN BOLUS VIA INFUSION
3000.0000 [IU] | Freq: Once | INTRAVENOUS | Status: AC
  Administered 2014-07-03: 3000 [IU] via INTRAVENOUS
  Filled 2014-07-03: qty 3000

## 2014-07-03 MED ORDER — CALCIUM CARBONATE 1250 (500 CA) MG PO TABS
1250.0000 mg | ORAL_TABLET | Freq: Three times a day (TID) | ORAL | Status: DC
  Administered 2014-07-03 – 2014-07-04 (×3): 1250 mg via ORAL
  Filled 2014-07-03 (×7): qty 1

## 2014-07-03 MED ORDER — HEPARIN BOLUS VIA INFUSION
1500.0000 [IU] | Freq: Once | INTRAVENOUS | Status: AC
  Administered 2014-07-03: 1500 [IU] via INTRAVENOUS
  Filled 2014-07-03: qty 1500

## 2014-07-03 NOTE — ED Notes (Signed)
Pt HR 100-120s in afib

## 2014-07-03 NOTE — Progress Notes (Signed)
ANTICOAGULATION CONSULT NOTE - Follow Up Consult  Pharmacy Consult for heparin Indication: afib  Allergies  Allergen Reactions  . Aspirin Nausea And Vomiting    Low Dose is ok  . Codeine Nausea And Vomiting    "Deathly Sick"  . Coreg [Carvedilol] Other (See Comments)    Fatigue/ ill  . Penicillins Rash    Patient Measurements: Height: 5' (152.4 cm) Weight: 152 lb 14.4 oz (69.355 kg) IBW/kg (Calculated) : 45.5 Heparin Dosing Weight: 69kg  Vital Signs: Temp: 98 F (36.7 C) (09/04 1404) Temp src: Oral (09/04 1404) BP: 124/61 mmHg (09/04 1404) Pulse Rate: 87 (09/04 1404)  Labs:  Recent Labs  07/02/14 2233 07/03/14 0459 07/03/14 1844  HGB 12.5 11.9*  --   HCT 37.6 37.1  --   PLT 311 295  --   HEPARINUNFRC  --   --  0.19*  CREATININE 1.18* 1.14*  --     Estimated Creatinine Clearance: 31.4 ml/min (by C-G formula based on Cr of 1.14).   Medications:  Scheduled:  . aspirin EC  81 mg Oral Daily  . calcium carbonate  1,250 mg Oral TID WC  . cyanocobalamin  500 mcg Oral Daily  . diltiazem  240 mg Oral Daily  . estrogens (conjugated)  0.625 mg Oral Daily  . ezetimibe-simvastatin  1 tablet Oral QHS  . levothyroxine  75 mcg Oral QAC breakfast  . [START ON 07/15/2014] medroxyPROGESTERone  5 mg Oral Daily  . olopatadine  1 drop Both Eyes BID   Infusions:  . heparin 800 Units/hr (07/03/14 1044)    Assessment: 80 yo female here with SOB and afib on heparin at 800 units/hr and the initial level is below goal (HL= 0.19).    Goal of Therapy:  Heparin level 0.3-0.7 units/ml Monitor platelets by anticoagulation protocol: Yes   Plan:  -Heparin bolus 1500 units IV x1 and increase infusion to 1100 units/hr -Heparin level in 8 hrs  Hildred Laser, Pharm D 07/03/2014 7:50 PM

## 2014-07-03 NOTE — H&P (Signed)
HPI: [name] is an 21F with atrial fibrillation, HTN, mild AS and HL here with paroxysmal Afib with RVR.  She was well until this evening when she noted exertional dyspnea.  Typically she can do all her ADLs without dyspnea, but noted SOB with walking her dog today.  It improved with rest but recurred when she started walking again.  She denies associated CP, palpitations, lightheadedness, dysuria, cough, dizziness or diaphoresis.  She decided to come to the ED to be evaluated, where she was found to be in atrial fibrillation with rates in the 140s.  She is typically unaware when she has episodes of afib.  Dr. Cathie Olden has attempted to rate control her with metoprolol and verapamil, however she has been intolerant of each.  Beta blockers cause extreme fatigue and she does not recall her reaction to verapamil.  She denies EtOH or caffeine intake.  [name] has a history of frequent epistaxis and recent GIB on Xeralto.  She is on ASA 81mg  only.  In the ED [name] was started on a diltiazem gtt with rates decreasing to 90s-100s.  She is asymptomatic.  Her BP has been in the 100s-110s.    Review of Systems:     Cardiac Review of Systems: [Y] = yes [ ]  = no  Chest Pain [    ]  Resting SOB [name]   ] Exertional SOB  [  ]  Orthopnea [  ]   Pedal Edema [   ]    Palpitations [  ] Syncope  [  ]   Presyncope [   ]  General Review of Systems: [Y] = yes [  ]=no Constitional: recent weight change [  ]; anorexia [  ]; fatigue [  ]; nausea [  ]; night sweats [  ]; fever [  ]; or chills [Y  ];                                                                                                                                          Dental: poor dentition[  ]; Last Dentist visit: not assessed  Eye : blurred vision [  ]; diplopia [   ]; vision changes [  ];  Amaurosis fugax[  ]; Resp: cough [  ];  wheezing[  ];  hemoptysis[  ]; shortness of breath[  ]; paroxysmal nocturnal dyspnea[  ]; dyspnea on exertion[ Y]; or  orthopnea[  ];  GI:  gallstones[  ], vomiting[  ];  dysphagia[  ]; melena[  ];  hematochezia [  ]; heartburn[  ];   Hx of  Colonoscopy[  ]; GU: kidney stones [  ]; hematuria[  ];   dysuria [  ];  nocturia[  ];  history of     obstruction [  ];  Skin: rash, swelling[  ];, hair loss[  ];  peripheral edema[  ];  or itching[  ]; Musculosketetal: myalgias[  ];  joint swelling[  ];  joint erythema[  ];  joint pain[  ];  back pain[  ];  Heme/Lymph: bruising[  ];  bleeding[  ];  anemia[  ];  Neuro: TIA[  ];  headaches[  ];  stroke[  ];  vertigo[  ];  seizures[  ];   paresthesias[  ];  difficulty walking[  ];  Psych:depression[  ]; anxiety[  ];  Endocrine: diabetes[  ];  thyroid dysfunction[  ];  Immunizations: Flu [  ]; Pneumococcal[  ];  Other:  Past Medical History  Diagnosis Date  . HTN (hypertension)   . HLD (hyperlipidemia)   . Hypothyroidism   . Aortic stenosis, moderate     last echo in 2010; AVA 1.1; mild AS per echo in June 2013  . Edema of both legs     s/p laser treatment per Dr. Donnetta Hutching  . A-fib      (Not in a hospital admission)   Allergies  Allergen Reactions  . Aspirin Nausea And Vomiting    Low Dose is ok  . Codeine Nausea And Vomiting    "Deathly Sick"  . Coreg [Carvedilol] Other (See Comments)    Fatigue/ ill  . Penicillins Rash    History   Social History  . Marital Status: Widowed    Spouse Name: N/A    Number of Children: 2  . Years of Education: N/A   Occupational History  . retired    Social History Main Topics  . Smoking status: Former Smoker    Types: Cigarettes    Quit date: 10/31/1983  . Smokeless tobacco: Never Used  . Alcohol Use: No  . Drug Use: No  . Sexual Activity: No   Other Topics Concern  . Not on file   Social History Narrative   Lives by herself     Family History  Problem Relation Age of Onset  . Peripheral vascular disease    . Heart disease Mother   . Peripheral vascular disease Mother   . Heart  disease Father   . Peripheral vascular disease Father     Right leg amputation  . Hypertension Sister   . Heart disease Brother 52    Heart Disease before age 36    PHYSICAL EXAM: Filed Vitals:   07/03/14 0118  BP: 103/57  Pulse: 98  Resp: 20   General:  Well appearing. No respiratory difficulty HEENT: normal Neck: supple. no JVD. Carotids 2+ bilat; no bruits. No lymphadenopathy or thryomegaly appreciated. Cor: PMI nondisplaced. Irregularly irregular. No rubs, gallops or murmurs. Lungs: clear bilaterally Abdomen: soft, nontender, nondistended. No hepatosplenomegaly. No bruits or masses. Good bowel sounds. Extremities: no cyanosis, clubbing, rash, edema Neuro: alert & oriented x 3, cranial nerves grossly intact. moves all 4 extremities w/o difficulty. Affect pleasant.  ECG: Atrial fibrillation, rate 136.  Non-specific ST-T changes.  Results for orders placed during the hospital encounter of 07/02/14 (from the past 24 hour(s))  CBC     Status: Abnormal   Collection Time    07/02/14 10:33 PM      Result Value Ref Range   WBC 11.5 (*) 4.0 - 10.5 K/uL   RBC 4.19  3.87 - 5.11 MIL/uL   Hemoglobin 12.5  12.0 - 15.0 g/dL   HCT 37.6  36.0 - 46.0 %   MCV 89.7  78.0 - 100.0  fL   MCH 29.8  26.0 - 34.0 pg   MCHC 33.2  30.0 - 36.0 g/dL   RDW 14.9  11.5 - 15.5 %   Platelets 311  150 - 400 K/uL  BASIC METABOLIC PANEL     Status: Abnormal   Collection Time    07/02/14 10:33 PM      Result Value Ref Range   Sodium 137  137 - 147 mEq/L   Potassium 4.7  3.7 - 5.3 mEq/L   Chloride 99  96 - 112 mEq/L   CO2 26  19 - 32 mEq/L   Glucose, Bld 145 (*) 70 - 99 mg/dL   BUN 32 (*) 6 - 23 mg/dL   Creatinine, Ser 1.18 (*) 0.50 - 1.10 mg/dL   Calcium 9.1  8.4 - 10.5 mg/dL   GFR calc non Af Amer 41 (*) >90 mL/min   GFR calc Af Amer 47 (*) >90 mL/min   Anion gap 12  5 - 15  I-STAT TROPOININ, ED     Status: None   Collection Time    07/02/14 10:38 PM      Result Value Ref Range   Troponin i,  poc 0.04  0.00 - 0.08 ng/mL   Comment 3            Dg Chest Port 1 View  07/03/2014   CLINICAL DATA:  Atrial fibrillation  EXAM: PORTABLE CHEST - 1 VIEW  COMPARISON:  01/22/2014  FINDINGS: Enlarged cardiac silhouette large hiatal hernia. Mild aortic atherosclerosis. Mediastinal contours otherwise within normal range. Mild left lung base/lingula opacity. No pleural effusion or pneumothorax. No acute osseous finding.  IMPRESSION: Enlarged cardiac silhouette.  Large hiatal hernia.  Mild left lung base/ lingular opacity; atelectasis versus infiltrate.   Electronically Signed   By: Carlos Levering M.D.   On: 07/03/2014 00:42    TTE: 01/22/14 EF 60-65%.  LA normal size.  Mild AS.  TSH 1.78 (05/15/2014)  ASSESSMENT: 79F with atrial fibrillation, HTN, mild AS and HL here with Afib with RVR.  Patient has not tolerated metoprolol or verapamil in the past.  Her heart rate has responded well thus far to diltiazem.  She is not a candidate for anticoagulation due to hematochezia 7/18 on Xarelto, which also precludes her from an attempt at rhythm control.  I suspect that she did not undergo colonoscopy during her July admission due to age and patient preference.  Given that she has been intolerant of multiple rate agents and her LA is not large, an attempt at rhythm control would be reasonable if a source of her bleeding is discovered and she can tolerate anticoagulation.  CHADSVASC score is 4.   PLAN/DISCUSSION: # Atrial fibrillation: - Continue diltiazem gtt.  Titrate to HR <100bpm - Start diltiazem CD 240mg   - Continue ASA 81mg  as above - Consider adding digoxin for additional rate control - Check urinalysis and urine culture  # HTN: BP well-controlled.   - Switch home Olmestartan to Irbesartan while in the hospital - Can titrate dose down if BP becomes an issue on diltiazem  # HL: Continue home Vytorin  # Hypothyroid.  TSH wnl 04/2014.   - Continue home synthroid

## 2014-07-03 NOTE — ED Notes (Signed)
Cardiology at bedside.

## 2014-07-03 NOTE — Progress Notes (Signed)
ANTICOAGULATION CONSULT NOTE - Initial Consult  Pharmacy Consult for heparin Indication: atrial fibrillation  Allergies  Allergen Reactions  . Aspirin Nausea And Vomiting    Low Dose is ok  . Codeine Nausea And Vomiting    "Deathly Sick"  . Coreg [Carvedilol] Other (See Comments)    Fatigue/ ill  . Penicillins Rash    Patient Measurements: Height: 5' (152.4 cm) Weight: 152 lb 14.4 oz (69.355 kg) IBW/kg (Calculated) : 45.5 Heparin Dosing Weight: 69kg  Vital Signs: Temp: 98 F (36.7 C) (09/04 0304) Temp src: Oral (09/04 0304) BP: 107/55 mmHg (09/04 0600) Pulse Rate: 74 (09/04 0600)  Labs:  Recent Labs  07/02/14 2233 07/03/14 0459  HGB 12.5 11.9*  HCT 37.6 37.1  PLT 311 295  CREATININE 1.18* 1.14*    Estimated Creatinine Clearance: 31.4 ml/min (by C-G formula based on Cr of 1.14).   Medical History: Past Medical History  Diagnosis Date  . HTN (hypertension)   . HLD (hyperlipidemia)   . Hypothyroidism   . Aortic stenosis, moderate     last echo in 2010; AVA 1.1; mild AS per echo in June 2013  . Edema of both legs     s/p laser treatment per Dr. Donnetta Hutching  . A-fib     Assessment: 80 year old female with palpitations and afib on monitor. Hx of afib and was previously on coumadin>>xarelto but suffered GIB no xarelto so it was stopped. New orders to start IV heparin with possible plans to transition to oral [facility] with warfarin or apixaban. Hgb 11.9, pltc normal.   CHA2DS2-Vasc score 4 (age, HTN, female)  Goal of Therapy:  Heparin level 0.3-0.7 units/ml Monitor platelets by anticoagulation protocol: Yes   Plan:  Give 3000 units bolus x 1 Start heparin infusion at 800 units/hr Check anti-Xa level in 8 hours and daily while on heparin Continue to monitor H&H and platelets  Erin Hearing PharmD., BCPS Clinical Pharmacist Pager [phone] 07/03/2014 8:32 AM

## 2014-07-03 NOTE — Progress Notes (Signed)
UR completed by Dorris Fetch RN, BSN

## 2014-07-03 NOTE — Progress Notes (Signed)
Patient Name: [name] Date of Encounter: 07/03/2014     Active Problems:   Atrial fibrillation    SUBJECTIVE  Had some palpitation and DOE yesterday, but no further sx. Feels good. Noted to be in a-fib in Apr while in [facility] office, started on coumadin and later transitioned to [place]. Xarelto stopped in July/Aug due to GI bleed. Per pt, she was admitted overnight to trend RBC, however did not require blood transfusion.   CURRENT MEDS . aspirin EC  81 mg Oral Daily  . calcium carbonate  1,250 mg Oral TID WC  . cyanocobalamin  500 mcg Oral Daily  . diltiazem  240 mg Oral Daily  . estrogens (conjugated)  0.625 mg Oral Daily  . ezetimibe-simvastatin  1 tablet Oral QHS  . irbesartan  300 mg Oral Daily  . levothyroxine  75 mcg Oral QAC breakfast  . [START ON 07/15/2014] medroxyPROGESTERone  5 mg Oral Daily  . olopatadine  1 drop Both Eyes BID    OBJECTIVE  Filed Vitals:   07/03/14 0222 07/03/14 0304 07/03/14 0400 07/03/14 0600  BP: 111/60 133/71 113/70 107/55  Pulse: 90 97 75 74  Temp:  98 F (36.7 C)    TempSrc:  Oral    Resp: 22 20    Height:  5' (1.524 m)    Weight:  152 lb 14.4 oz (69.355 kg)    SpO2: 99% 100% 100% 98%    Intake/Output Summary (Last 24 hours) at 07/03/14 0741 Last data filed at 07/03/14 0304  Gross per 24 hour  Intake    500 ml  Output    400 ml  Net    100 ml   Filed Weights   07/03/14 0304  Weight: 152 lb 14.4 oz (69.355 kg)    PHYSICAL EXAM  General: Pleasant, NAD. Neuro: Alert and oriented X 3. Moves all extremities spontaneously. Psych: Normal affect. HEENT:  Normal  Neck: Supple without bruits or JVD. Lungs:  Resp regular and unlabored, CTA. Heart: irregular no s3, s4, or murmurs. Abdomen: Soft, non-tender, non-distended, BS + x 4.  Extremities: No clubbing, cyanosis. DP/PT/Radials 2+ and equal bilaterally. LE redness and brown coloration, mild edema  Accessory Clinical Findings  CBC  Recent Labs   07/02/14 2233 07/03/14 0459  WBC 11.5* 10.6*  HGB 12.5 11.9*  HCT 37.6 37.1  MCV 89.7 90.3  PLT 311 681   Basic Metabolic Panel  Recent Labs  07/02/14 2233 07/03/14 0459  NA 137 137  K 4.7 4.7  CL 99 101  CO2 26 25  GLUCOSE 145* 144*  BUN 32* 28*  CREATININE 1.18* 1.14*  CALCIUM 9.1 8.6    TELE A-fib with HR 70s-80s, occasional HR in 100s    ECG  A-fib with RVR with HR 130s  Echocardiogram  01/22/2014 LV EF: 60% - 65%  ------------------------------------------------------------ Indications: Ventricular tachycardia 427.1",.  ------------------------------------------------------------ History: PMH: Syncope. Aortic stenosis. Risk factors: Leg edema. Venous insufficiency. Former tobacco use. Hypertension.  ------------------------------------------------------------ Study Conclusions  - Left ventricle: The cavity size was normal. Wall thickness was increased in a pattern of mild LVH. Systolic function was normal. The estimated ejection fraction was in the range of 60% to 65%. Wall motion was normal; there were no regional wall motion abnormalities. The study is not technically sufficient to allow evaluation of LV diastolic function. - Aortic valve: There was very mild stenosis. Valve area: 1.85cm^2(VTI). Valve area: 1.58cm^2 (Vmax). - Left atrium: The atrium was normal in size.  Radiology/Studies  Dg Chest Port 1 View  07/03/2014   CLINICAL DATA:  Atrial fibrillation  EXAM: PORTABLE CHEST - 1 VIEW  COMPARISON:  01/22/2014  FINDINGS: Enlarged cardiac silhouette large hiatal hernia. Mild aortic atherosclerosis. Mediastinal contours otherwise within normal range. Mild left lung base/lingula opacity. No pleural effusion or pneumothorax. No acute osseous finding.  IMPRESSION: Enlarged cardiac silhouette.  Large hiatal hernia.  Mild left lung base/ lingular opacity; atelectasis versus infiltrate.   Electronically Signed   By: Carlos Levering M.D.   On:  07/03/2014 00:42    ASSESSMENT AND PLAN  1. A-fib with RVR, unkown duration  - CHA2DS2-Vasc score 4 (age, HTN, female)  - intolerant to BB  - continue PO cardizem, stop Diltiazem gtt, BP borderline, d/c ARB. Start heparin gtt  - discussed different treatment options, including TEE w/ cardioversion, medical therapy for 3 wks and then cardioversion, vs medical therapy alone  - patient does not want TEE  - given her recent GI bleeding 1 month ago on Xarelto, can consider eliquis vs coumadin  - consider rate control today and adjust BP med, discharge tomorrow to follow up with Dr. Acie Fredrickson (then if decide on cardioversion later, can do outpt cardioversion w/o TEE)    2. HTN: stopping arb while starting Diltiazem given borderline BP 3. mild AS  - Echo 01/22/2014 EF 60-65%, mild LVH, mild aortic stenosis 4. HL 5. GI bleed while on Xarelto 1 month ago, no recurrence  Signed, Almyra Deforest PA-C Pager: [phone] As above, patient seen and examined. Admitted with paroxysmal atrial fibrillation. She was symptomatic including fatigue and palpitations. In reviewing previous notes she has had difficulties tolerating medications including metoprolol and verapamil. She was also placed on` xarelto but developed hematochezia. This was discontinued and she was placed on aspirin. Her LV function is normal. This morning her heart rate is improved with the addition of Cardizem. Would continue present by mouth dose and follow on telemetry. Increase as needed. If rate not controlled with Cardizem alone could consider addition of amiodarone. Continue aspirin. She is somewhat hesitant to see gastroenterology but states she would be agreeable. This can be arranged as an outpatient. If no source of bleeding identified via colonoscopy and anticoagulation can be resumed. DC ARB given addition of Cardizem and borderline blood pressure. Kirk Ruths

## 2014-07-04 DIAGNOSIS — I4891 Unspecified atrial fibrillation: Principal | ICD-10-CM

## 2014-07-04 LAB — CBC
HCT: 34.1 % — ABNORMAL LOW (ref 36.0–46.0)
Hemoglobin: 11.3 g/dL — ABNORMAL LOW (ref 12.0–15.0)
MCH: 30 pg (ref 26.0–34.0)
MCHC: 33.1 g/dL (ref 30.0–36.0)
MCV: 90.5 fL (ref 78.0–100.0)
Platelets: 267 10*3/uL (ref 150–400)
RBC: 3.77 MIL/uL — ABNORMAL LOW (ref 3.87–5.11)
RDW: 15 % (ref 11.5–15.5)
WBC: 10.5 10*3/uL (ref 4.0–10.5)

## 2014-07-04 LAB — URINE CULTURE: Colony Count: 70000

## 2014-07-04 LAB — HEPARIN LEVEL (UNFRACTIONATED)
Heparin Unfractionated: 0.3 IU/mL (ref 0.30–0.70)
Heparin Unfractionated: 0.4 IU/mL (ref 0.30–0.70)

## 2014-07-04 MED ORDER — DILTIAZEM HCL ER COATED BEADS 240 MG PO CP24: 240.0000 mg | ORAL_CAPSULE | Freq: Every day | ORAL | Status: DC

## 2014-07-04 NOTE — Discharge Summary (Signed)
Discharge Summary   Patient ID: [name] MRN: [id number], DOB/AGE: 1927-05-10 80 y.o. Admit date: 07/02/2014 D/C date:     07/04/2014  Primary Cardiologist: Dr. Acie Fredrickson  Principal Problem:   Atrial fibrillation Active Problems:   Aortic stenosis   HTN (hypertension)   Atrial tachycardia, paroxysmal   HLD (hyperlipidemia)   Lower GI bleed   Discharge Diagnosis: afib with RVR s/p spontaneous conversion into NSR  HPI: [name] is a 80 y.o. female with a history of atrial fibrillation, HTN, mild AS and HLD who was admitted to [facility] ED on 07/03/14 for paroxysmal Afib with RVR.   She was well until the evening of admission when she noted exertional dyspnea. Typically she can do all her ADLs without dyspnea, but noted SOB with walking her dog today. It improved with rest but recurred when she started walking again. She denies associated CP, palpitations, lightheadedness, dysuria, cough, dizziness or diaphoresis. She decided to come to the ED to be evaluated, where she was found to be in atrial fibrillation with rates in the 140s. She is typically unaware when she has episodes of afib. Dr. Cathie Olden has attempted to rate control her with metoprolol and verapamil, however she has been intolerant of each. Beta blockers cause extreme fatigue and she does not recall her reaction to verapamil. She denies EtOH or caffeine intake.   Hospital Course  A-fib with RVR, unkown duration  -- Patient has converted to sinus rhythm.  -- Continue Cardizem CD 240mg . Continue aspirin.  -- Dr. Stanford Breed would like for her to followup with gastroenterology for recent hematochezia. Once cleared then we could try to anticoagulate again, favoring apixaban   HTN: blood pressure appears to be reasonable on Cardizem. ARB was discontinued. -- Continue off of ARB.   Mild AS  -- Echo 01/22/2014 EF 60-65%, mild LVH, mild aortic stenosis   HLD- cont Vytorin and fish oil  GI bleed while on Xarelto 1 month  ago, no recurrence   The patient has had an uncomplicated hospital course and is recovering well. She has been seen by Dr. Stanford Breed today and deemed ready for discharge home. All follow-up appointments have been scheduled (voice mail has been left as she is being discharged on the weekend). Discharge medications are listed below.   Discharge Vitals: Blood pressure 140/86, pulse 63, temperature 97.7 F (36.5 C), temperature source Oral, resp. rate 16, height 5' (1.524 m), weight 154 lb 15.7 oz (70.3 kg), SpO2 95.00%.  Labs: Lab Results  Component Value Date   WBC 10.5 07/04/2014   HGB 11.3* 07/04/2014   HCT 34.1* 07/04/2014   MCV 90.5 07/04/2014   PLT 267 07/04/2014     Recent Labs Lab 07/03/14 0459  NA 137  K 4.7  CL 101  CO2 25  BUN 28*  CREATININE 1.14*  CALCIUM 8.6  GLUCOSE 144*     Diagnostic Studies/Procedures   Dg Chest Port 1 View  07/03/2014   CLINICAL DATA:  Atrial fibrillation  EXAM: PORTABLE CHEST - 1 VIEW  COMPARISON:  01/22/2014  FINDINGS: Enlarged cardiac silhouette large hiatal hernia. Mild aortic atherosclerosis. Mediastinal contours otherwise within normal range. Mild left lung base/lingula opacity. No pleural effusion or pneumothorax. No acute osseous finding.  IMPRESSION: Enlarged cardiac silhouette.  Large hiatal hernia.  Mild left lung base/ lingular opacity; atelectasis versus infiltrate.      Discharge Medications     Medication List    STOP taking these medications  olmesartan 40 MG tablet  Commonly known as:  BENICAR      TAKE these medications       acetaminophen 500 MG tablet  Commonly known as:  TYLENOL  Take 1,000 mg by mouth every 6 (six) hours as needed (pain).     aspirin 81 MG tablet  Take 81 mg by mouth daily.     calcium carbonate 600 MG Tabs tablet  Commonly known as:  OS-CAL  Take 1,800 mg by mouth daily.     cyanocobalamin 500 MCG tablet  Take 500 mcg by mouth daily.     diclofenac sodium 1 % Gel  Commonly known as:   VOLTAREN  Apply 2 g topically 4 (four) times daily as needed (For pain).     dicyclomine 20 MG tablet  Commonly known as:  BENTYL  Take 10 mg by mouth every 6 (six) hours as needed for spasms. For stomach irritability.     diltiazem 240 MG 24 hr capsule  Commonly known as:  CARDIZEM CD  Take 1 capsule (240 mg total) by mouth daily.     estrogens (conjugated) 0.625 MG tablet  Commonly known as:  PREMARIN  Take 0.625 mg by mouth daily. Take daily for 21 days then do not take for 7 days.     ezetimibe-simvastatin 10-40 MG per tablet  Commonly known as:  VYTORIN  Take 1 tablet by mouth at bedtime.     Fish Oil 1000 MG Caps  Take 1 capsule by mouth 3 (three) times daily.     levothyroxine 75 MCG tablet  Commonly known as:  SYNTHROID, LEVOTHROID  Take 75 mcg by mouth daily.     medroxyPROGESTERone 2.5 MG tablet  Commonly known as:  PROVERA  Take 5 mg by mouth daily. For 10 days each month     olopatadine 0.1 % ophthalmic solution  Commonly known as:  PATANOL  Place 1 drop into both eyes 2 (two) times daily.     PRESERVISION AREDS 2 Caps  Take 1 capsule by mouth 2 (two) times daily.        Disposition   The patient will be discharged in stable condition to home.  Follow-up Information   Follow up with Darden Amber., MD. (The office will call to make an appointment for you in the next few weeks with Dr Acie Fredrickson. Please call them if you do not hear from them by next wednesday)    Specialty:  Cardiology   Contact information:   [address] [place] [address] [phone]       Follow up with [facility]. (Please make an appointment )    Specialty:  Gastroenterology   Contact information:   [place] [address] [phone]        Duration of Discharge Encounter: Greater than 30 minutes including physician and PA time.  Mable Fill R PA-C 07/04/2014, 6:13 PM

## 2014-07-04 NOTE — Progress Notes (Signed)
ANTICOAGULATION CONSULT NOTE - Follow Up Consult  Pharmacy Consult for heparin Indication: afib  Allergies  Allergen Reactions  . Aspirin Nausea And Vomiting    Low Dose is ok  . Codeine Nausea And Vomiting    "Deathly Sick"  . Coreg [Carvedilol] Other (See Comments)    Fatigue/ ill  . Penicillins Rash    Patient Measurements: Height: 5' (152.4 cm) Weight: 154 lb 15.7 oz (70.3 kg) IBW/kg (Calculated) : 45.5 Heparin Dosing Weight: 69kg  Vital Signs: Temp: 97.7 F (36.5 C) (09/05 0521) Temp src: Oral (09/05 0521) BP: 141/51 mmHg (09/05 0521) Pulse Rate: 63 (09/05 0521)  Labs:  Recent Labs  07/02/14 2233 07/03/14 0459 07/03/14 1844 07/04/14 0519  HGB 12.5 11.9*  --  11.3*  HCT 37.6 37.1  --  34.1*  PLT 311 295  --  267  HEPARINUNFRC  --   --  0.19* 0.30  CREATININE 1.18* 1.14*  --   --     Estimated Creatinine Clearance: 31.6 ml/min (by C-G formula based on Cr of 1.14).   Medications:  Scheduled:  . aspirin EC  81 mg Oral Daily  . calcium carbonate  1,250 mg Oral TID WC  . cyanocobalamin  500 mcg Oral Daily  . diltiazem  240 mg Oral Daily  . estrogens (conjugated)  0.625 mg Oral Daily  . ezetimibe-simvastatin  1 tablet Oral QHS  . levothyroxine  75 mcg Oral QAC breakfast  . [START ON 07/15/2014] medroxyPROGESTERone  5 mg Oral Daily  . olopatadine  1 drop Both Eyes BID   Infusions:  . heparin 1,100 Units/hr (07/03/14 2217)    Assessment: 80 yo female on heparin for afib. Heparin level 0.3 (low end of goal). No bleeding noted.    Goal of Therapy:  Heparin level 0.3-0.7 units/ml Monitor platelets by anticoagulation protocol: Yes   Plan:  -Continue Heparin 1100 units/hr -Heparin level in 8 hrs to confirm therapeutic  Sherlon Handing, PharmD, BCPS Clinical pharmacist, pager [phone]  07/04/2014 6:28 AM

## 2014-07-04 NOTE — Progress Notes (Signed)
Pt converted to SR. EKG obtained. Md aware. Will cont to monitor pt.

## 2014-07-04 NOTE — Progress Notes (Signed)
Patient Name: [name] Date of Encounter: 07/04/2014     Active Problems:   Atrial fibrillation    SUBJECTIVE  Denies CP or dyspnea  CURRENT MEDS . aspirin EC  81 mg Oral Daily  . calcium carbonate  1,250 mg Oral TID WC  . cyanocobalamin  500 mcg Oral Daily  . diltiazem  240 mg Oral Daily  . estrogens (conjugated)  0.625 mg Oral Daily  . ezetimibe-simvastatin  1 tablet Oral QHS  . levothyroxine  75 mcg Oral QAC breakfast  . [START ON 07/15/2014] medroxyPROGESTERone  5 mg Oral Daily  . olopatadine  1 drop Both Eyes BID    OBJECTIVE  Filed Vitals:   07/03/14 1404 07/03/14 2044 07/04/14 0521 07/04/14 0932  BP: 124/61 123/56 141/51 140/86  Pulse: 87 72 63   Temp: 98 F (36.7 C) 98.6 F (37 C) 97.7 F (36.5 C)   TempSrc: Oral Oral Oral   Resp: 16 18 16    Height:      Weight:   154 lb 15.7 oz (70.3 kg)   SpO2: 99% 99% 95%     Intake/Output Summary (Last 24 hours) at 07/04/14 1222 Last data filed at 07/04/14 0522  Gross per 24 hour  Intake    360 ml  Output    250 ml  Net    110 ml   Filed Weights   07/03/14 0304 07/04/14 0521  Weight: 152 lb 14.4 oz (69.355 kg) 154 lb 15.7 oz (70.3 kg)    PHYSICAL EXAM  General: Pleasant, NAD. Neuro: Alert and oriented X 3. Moves all extremities spontaneously. Psych: Normal affect. HEENT:  Normal  Neck: Supple  Lungs: CTA. Heart: RRR Abdomen: Soft, non-tender, non-distended Extremities: No edema  Accessory Clinical Findings  CBC  Recent Labs  07/03/14 0459 07/04/14 0519  WBC 10.6* 10.5  HGB 11.9* 11.3*  HCT 37.1 34.1*  MCV 90.3 90.5  PLT 295 448   Basic Metabolic Panel  Recent Labs  07/02/14 2233 07/03/14 0459  NA 137 137  K 4.7 4.7  CL 99 101  CO2 26 25  GLUCOSE 145* 144*  BUN 32* 28*  CREATININE 1.18* 1.14*  CALCIUM 9.1 8.6    TELE Sinus    ECG  A-fib with RVR with HR 130s  Echocardiogram  01/22/2014 LV EF: 60% -  65%  ------------------------------------------------------------ Indications: Ventricular tachycardia 427.1",.  ------------------------------------------------------------ History: PMH: Syncope. Aortic stenosis. Risk factors: Leg edema. Venous insufficiency. Former tobacco use. Hypertension.  ------------------------------------------------------------ Study Conclusions  - Left ventricle: The cavity size was normal. Wall thickness was increased in a pattern of mild LVH. Systolic function was normal. The estimated ejection fraction was in the range of 60% to 65%. Wall motion was normal; there were no regional wall motion abnormalities. The study is not technically sufficient to allow evaluation of LV diastolic function. - Aortic valve: There was very mild stenosis. Valve area: 1.85cm^2(VTI). Valve area: 1.58cm^2 (Vmax). - Left atrium: The atrium was normal in size.     Radiology/Studies  Dg Chest Port 1 View  07/03/2014   CLINICAL DATA:  Atrial fibrillation  EXAM: PORTABLE CHEST - 1 VIEW  COMPARISON:  01/22/2014  FINDINGS: Enlarged cardiac silhouette large hiatal hernia. Mild aortic atherosclerosis. Mediastinal contours otherwise within normal range. Mild left lung base/lingula opacity. No pleural effusion or pneumothorax. No acute osseous finding.  IMPRESSION: Enlarged cardiac silhouette.  Large hiatal hernia.  Mild left lung base/ lingular opacity; atelectasis versus infiltrate.   Electronically Signed  By: Carlos Levering M.D.   On: 07/03/2014 00:42    ASSESSMENT AND PLAN  1. A-fib with RVR, unkown duration  - patient has converted to sinus rhythm. Continue Cardizem. Continue aspirin. I would like for her to followup with gastroenterology for recent hematochezia. Once cleared then we could try to anticoagulate again. I would favor apixaban   2. HTN: blood pressure appears to be reasonable on Cardizem. Continue off of ARB.  3. mild AS  - Echo 01/22/2014 EF 60-65%, mild LVH,  mild aortic stenosis 4. HL 5. GI bleed while on Xarelto 1 month ago, no recurrence Followup with Dr. Acie Fredrickson following discharge.  Greater than 30 minutes PA and physician time. D2. Signed, Kirk Ruths

## 2014-07-05 NOTE — Discharge Summary (Signed)
See progress notes [name]  

## 2014-07-13 ENCOUNTER — Telehealth: Payer: Self-pay | Admitting: Cardiovascular Disease

## 2014-07-13 NOTE — Telephone Encounter (Signed)
New problem:    Per pt she just wants to speak to the RN.   Pt would not give any details.   Please give her a call back.

## 2014-07-13 NOTE — Telephone Encounter (Signed)
Spoke with patient who states she continues to not tolerate medications well and would like to see Dr. Acie Fredrickson before 10/5.  Patient states she has been d/c'ed from hospital 9/5 that she has tried taking Cardizem 240 mg at different times of the day but she continues to feel lethargic and like she cannot complete her ADLs without difficulty.  I moved patient's appointment up to this Wednesday; patient verbalized agreement and gratitude.

## 2014-07-15 ENCOUNTER — Ambulatory Visit (INDEPENDENT_AMBULATORY_CARE_PROVIDER_SITE_OTHER): Payer: Medicare Other | Admitting: Cardiovascular Disease

## 2014-07-15 ENCOUNTER — Encounter: Payer: Self-pay | Admitting: Cardiovascular Disease

## 2014-07-15 VITALS — BP 150/68 | HR 67 | Ht 60.0 in | Wt 152.4 lb

## 2014-07-15 DIAGNOSIS — I6529 Occlusion and stenosis of unspecified carotid artery: Secondary | ICD-10-CM | POA: Diagnosis not present

## 2014-07-15 DIAGNOSIS — I10 Essential (primary) hypertension: Secondary | ICD-10-CM

## 2014-07-15 DIAGNOSIS — I480 Paroxysmal atrial fibrillation: Secondary | ICD-10-CM

## 2014-07-15 DIAGNOSIS — I4891 Unspecified atrial fibrillation: Secondary | ICD-10-CM | POA: Diagnosis not present

## 2014-07-15 MED ORDER — DILTIAZEM HCL ER COATED BEADS 180 MG PO CP24: 180.0000 mg | ORAL_CAPSULE | Freq: Every day | ORAL | Status: DC

## 2014-07-15 NOTE — Progress Notes (Signed)
[name] Date of Birth  01/17/1927       [facility]    [employer] [address], [address], [place], [place]  [address]   [place], [place]  [address] [phone]     [phone]   Fax  [phone]    Fax [phone]  Problem List: 1. Hypertension 2. Chronic leg edema 3. Hx of mild aortic stenosis 4. Hyperlipidemia  History of Present Illness:  [name] is an 80 yo with the above noted history.    We've been gradually uptitrated her medications. She was recently started on a higher dose of Coreg but she was not able to tolerate appear to cause her to be weak and dizzy.  She had an echo recently Echo Study Conclusions from June 2013   - Left ventricle: The cavity size was normal. Wall thickness was increased in a pattern of mild LVH. Systolic function was normal. The estimated ejection fraction was in the range of 55% to 60%. - Aortic valve: There was very mild stenosis. - Mitral valve: Mild regurgitation. - Left atrium: The atrium was mildly dilated. - Atrial septum: No defect or patent foramen ovale was Identified. - Pulmonary arteries: PA peak pressure: 25mm Hg (S). - Pericardium, extracardiac: A trivial pericardial effusion was identified.  She admits that she does not sleep well at night.   April 01, 2013:  She is feeling well.  Her BP is mildly elevated this am.  She avoids salt.    05/11/2014: [name]  was admitted to the hospital with an episode of syncope ( January 22, 2014).  She developed left lower leg cellulitis and had 2 admissions for that.    She has been found to have atrial fibrillation.   She has been started on coumadin   Sept. 16, 2015  [name] was recently admitted to the hospital with rapid atrial fibrillation.  She is basically asympotmatic - she tolerates the A-fib fairly well.   She converted to normal rhythm on IV Cardizem. She was started on by mouth Cardizem and has maintained  sinus rhythm. She comments that she is very fatigued and is not able to do much on this dose of Cardizem.  She was also found to have blood in her stool and she needs to see GI to be cleared for resumption of her Eliquis.    Current Outpatient Prescriptions on File Prior to Visit  Medication Sig Dispense Refill  . acetaminophen (TYLENOL) 500 MG tablet Take 1,000 mg by mouth every 6 (six) hours as needed (pain).       Marland Kitchen aspirin 81 MG tablet Take 81 mg by mouth daily.      . calcium carbonate (OS-CAL) 600 MG TABS Take 1,800 mg by mouth daily.      . diclofenac sodium (VOLTAREN) 1 % GEL Apply 2 g topically 4 (four) times daily as needed (For pain).      Marland Kitchen dicyclomine (BENTYL) 20 MG tablet Take 10 mg by mouth every 6 (six) hours as needed for spasms. For stomach irritability.      Marland Kitchen diltiazem (CARDIZEM CD) 240 MG 24 hr capsule Take 1 capsule (240 mg total) by mouth daily.  30 capsule  3  . estrogens, conjugated, (PREMARIN) 0.625 MG tablet Take 0.625 mg by mouth daily. Take daily for 21 days then do not take for 7 days.      Marland Kitchen ezetimibe-simvastatin (VYTORIN) 10-40 MG per tablet Take 1  tablet by mouth at bedtime.      Marland Kitchen levothyroxine (SYNTHROID, LEVOTHROID) 75 MCG tablet Take 75 mcg by mouth daily.        . medroxyPROGESTERone (PROVERA) 2.5 MG tablet Take 5 mg by mouth daily. For 10 days each month      . Multiple Vitamins-Minerals (PRESERVISION AREDS 2) CAPS Take 1 capsule by mouth 2 (two) times daily.      Marland Kitchen olopatadine (PATANOL) 0.1 % ophthalmic solution Place 1 drop into both eyes 2 (two) times daily.      . Omega-3 Fatty Acids (FISH OIL) 1000 MG CAPS Take 1 capsule by mouth 3 (three) times daily.      . [DISCONTINUED] losartan-hydrochlorothiazide (HYZAAR) 100-12.5 MG per tablet Take 1 tablet by mouth daily.  30 tablet  11   No current facility-administered medications on file prior to visit.  she is not taking her coreg due to side effects ( fatigue, "sick"  Allergies  Allergen Reactions  .  Aspirin Nausea And Vomiting    Low Dose is ok  . Codeine Nausea And Vomiting    "Deathly Sick"  . Coreg [Carvedilol] Other (See Comments)    Fatigue/ ill  . Penicillins Rash    Past Medical History  Diagnosis Date  . HTN (hypertension)   . HLD (hyperlipidemia)   . Hypothyroidism   . Aortic stenosis, moderate     last echo in 2010; AVA 1.1; mild AS per echo in June 2013  . Edema of both legs     s/p laser treatment per Dr. Donnetta Hutching  . A-fib     Past Surgical History  Procedure Laterality Date  . Appendectomy  1955  . Foot surgery  2003    bilateral Hammer Toe  . Eye surgery  2005    Bilateral cataract  . Endovenous ablation saphenous vein w/ laser  08-29-2012    left greater saphenous vein   Curt Jews MD  . Endovenous ablation saphenous vein w/ laser  09-19-2012    right greater saphenous vein by Curt Jews MD  . Stab phlebectomy Right 01-02-2013    10-15 incisions right thigh and calf by Curt Jews MD    History  Smoking status  . Former Smoker  . Types: Cigarettes  . Quit date: 10/31/1983  Smokeless tobacco  . Never Used    History  Alcohol Use No    Family History  Problem Relation Age of Onset  . Peripheral vascular disease    . Heart disease Mother   . Peripheral vascular disease Mother   . Heart disease Father   . Peripheral vascular disease Father     Right leg amputation  . Hypertension Sister   . Heart disease Brother 30    Heart Disease before age 49    Reviw of Systems:  Reviewed in the HPI.  All other systems are negative.  Physical Exam: Blood pressure 150/68, pulse 67, height 5' (1.524 m), weight 152 lb 6.4 oz (69.128 kg). General: Well developed, well nourished, in no acute distress.  Head: Normocephalic, atraumatic, sclera non-icteric, mucus membranes are moist,   Neck: Supple. Carotids are 2 + without bruits. No JVD  Lungs: Clear bilaterally to auscultation.  Heart: RR,  Her heart rate is fast. She has a soft systolic  murmur  Abdomen: Soft, non-tender, non-distended with normal bowel sounds. No hepatomegaly. No rebound/guarding. No masses.  Msk:  Strength and tone are normal  Extremities: Shows chronic stasis changes of her left leg. His  cellulitis that is gradually healing. She has a scaly rash.  Has trace edema of her right leg  Neuro: Alert and oriented X 3. Moves all extremities spontaneously.  Psych:  Responds to questions appropriately with a normal affect.  ECG: Sept. 16, 2015:  NSR at 17 .  Occasional PACs.  Assessment / Plan:

## 2014-07-15 NOTE — Assessment & Plan Note (Addendum)
[name] is doing ok.    She is in NSR.  Complains of being fatigued.  Will decrease her diltiazem to 180  A day. Continue with her current meds otherwise.

## 2014-07-15 NOTE — Patient Instructions (Signed)
Your physician has recommended you make the following change in your medication:  DECREASE Diltiazem to 180 mg once daily  Your physician recommends that you schedule a follow-up appointment in: 3 months with Dr. Acie Fredrickson.

## 2014-07-15 NOTE — Assessment & Plan Note (Signed)
BP is ok Will follow up at her next office visit

## 2014-07-27 DIAGNOSIS — Z1231 Encounter for screening mammogram for malignant neoplasm of breast: Secondary | ICD-10-CM | POA: Diagnosis not present

## 2014-08-03 ENCOUNTER — Encounter: Payer: Medicare Other | Admitting: Cardiovascular Disease

## 2014-08-11 ENCOUNTER — Ambulatory Visit: Payer: Medicare Other | Admitting: Cardiovascular Disease

## 2014-08-13 DIAGNOSIS — R8299 Other abnormal findings in urine: Secondary | ICD-10-CM | POA: Diagnosis not present

## 2014-08-13 DIAGNOSIS — E785 Hyperlipidemia, unspecified: Secondary | ICD-10-CM | POA: Diagnosis not present

## 2014-08-13 DIAGNOSIS — Z789 Other specified health status: Secondary | ICD-10-CM | POA: Diagnosis not present

## 2014-08-13 DIAGNOSIS — I10 Essential (primary) hypertension: Secondary | ICD-10-CM | POA: Diagnosis not present

## 2014-08-13 DIAGNOSIS — E039 Hypothyroidism, unspecified: Secondary | ICD-10-CM | POA: Diagnosis not present

## 2014-08-17 ENCOUNTER — Encounter (HOSPITAL_BASED_OUTPATIENT_CLINIC_OR_DEPARTMENT_OTHER): Payer: Self-pay | Admitting: Emergency Medicine

## 2014-08-17 ENCOUNTER — Emergency Department (HOSPITAL_BASED_OUTPATIENT_CLINIC_OR_DEPARTMENT_OTHER)
Admission: EM | Admit: 2014-08-17 | Discharge: 2014-08-17 | Disposition: A | Discharge status: Home / Self Care | Payer: Medicare Other | Attending: Emergency Medicine | Admitting: Emergency Medicine

## 2014-08-17 DIAGNOSIS — I4891 Unspecified atrial fibrillation: Secondary | ICD-10-CM | POA: Diagnosis not present

## 2014-08-17 DIAGNOSIS — Z23 Encounter for immunization: Secondary | ICD-10-CM | POA: Insufficient documentation

## 2014-08-17 DIAGNOSIS — L0390 Cellulitis, unspecified: Secondary | ICD-10-CM

## 2014-08-17 DIAGNOSIS — Z7982 Long term (current) use of aspirin: Secondary | ICD-10-CM | POA: Diagnosis not present

## 2014-08-17 DIAGNOSIS — Z87891 Personal history of nicotine dependence: Secondary | ICD-10-CM | POA: Insufficient documentation

## 2014-08-17 DIAGNOSIS — Z880 Allergy status to penicillin: Secondary | ICD-10-CM | POA: Insufficient documentation

## 2014-08-17 DIAGNOSIS — E785 Hyperlipidemia, unspecified: Secondary | ICD-10-CM | POA: Diagnosis not present

## 2014-08-17 DIAGNOSIS — I10 Essential (primary) hypertension: Secondary | ICD-10-CM | POA: Insufficient documentation

## 2014-08-17 DIAGNOSIS — Z79899 Other long term (current) drug therapy: Secondary | ICD-10-CM | POA: Insufficient documentation

## 2014-08-17 DIAGNOSIS — E039 Hypothyroidism, unspecified: Secondary | ICD-10-CM | POA: Diagnosis not present

## 2014-08-17 DIAGNOSIS — L0291 Cutaneous abscess, unspecified: Secondary | ICD-10-CM

## 2014-08-17 DIAGNOSIS — L03115 Cellulitis of right lower limb: Secondary | ICD-10-CM | POA: Diagnosis not present

## 2014-08-17 DIAGNOSIS — L02415 Cutaneous abscess of right lower limb: Secondary | ICD-10-CM | POA: Diagnosis not present

## 2014-08-17 DIAGNOSIS — Z7952 Long term (current) use of systemic steroids: Secondary | ICD-10-CM | POA: Diagnosis not present

## 2014-08-17 MED ORDER — TETANUS-DIPHTH-ACELL PERTUSSIS 5-2.5-18.5 LF-MCG/0.5 IM SUSP
0.5000 mL | Freq: Once | INTRAMUSCULAR | Status: AC
  Administered 2014-08-17: 0.5 mL via INTRAMUSCULAR
  Filled 2014-08-17: qty 0.5

## 2014-08-17 MED ORDER — DOXYCYCLINE HYCLATE 100 MG PO CAPS: 100.0000 mg | ORAL_CAPSULE | Freq: Two times a day (BID) | ORAL | Status: DC

## 2014-08-17 MED ORDER — MUPIROCIN CALCIUM 2 % EX CREA: 1.0000 "application " | TOPICAL_CREAM | Freq: Two times a day (BID) | CUTANEOUS | Status: DC

## 2014-08-17 NOTE — ED Notes (Signed)
MD at bedside. 

## 2014-08-17 NOTE — ED Notes (Signed)
Pt reports a bump on the medial right leg that has worsened since Friday. Pain in RLE with associated redness.

## 2014-08-17 NOTE — Discharge Instructions (Signed)

## 2014-08-17 NOTE — ED Provider Notes (Signed)
CSN: [id number]     Arrival date & time 08/17/14  1033 History   First MD Initiated Contact with Patient 08/17/14 1105     Chief Complaint  Patient presents with  . Abscess     (Consider location/radiation/quality/duration/timing/severity/associated sxs/prior Treatment) HPI Comments: Patient presents with a sore on her right leg. She was treated for cellulitis of her left leg a few months ago. She took doxycycline and also use Bactroban ointment. She was admitted for IV antibiotics at one point. She states yesterday she noticed a small bump on her right leg that sore. She's noticed a little bit of redness surrounding the area that started this morning. She denies he fevers or chills. She denies any drainage. She's not sure when her last tetanus shot was. She has not used anything at home for the symptoms. She has a constant throbbing soreness to the area.   Past Medical History  Diagnosis Date  . HTN (hypertension)   . HLD (hyperlipidemia)   . Hypothyroidism   . Aortic stenosis, moderate     last echo in 2010; AVA 1.1; mild AS per echo in June 2013  . Edema of both legs     s/p laser treatment per Dr. Donnetta Hutching  . A-fib    Past Surgical History  Procedure Laterality Date  . Appendectomy  1955  . Foot surgery  2003    bilateral Hammer Toe  . Eye surgery  2005    Bilateral cataract  . Endovenous ablation saphenous vein w/ laser  08-29-2012    left greater saphenous vein   Curt Jews MD  . Endovenous ablation saphenous vein w/ laser  09-19-2012    right greater saphenous vein by Curt Jews MD  . Stab phlebectomy Right 01-02-2013    10-15 incisions right thigh and calf by Curt Jews MD   Family History  Problem Relation Age of Onset  . Peripheral vascular disease    . Heart disease Mother   . Peripheral vascular disease Mother   . Heart disease Father   . Peripheral vascular disease Father     Right leg amputation  . Hypertension Sister   . Heart disease Brother 4    Heart  Disease before age 17   History  Substance Use Topics  . Smoking status: Former Smoker    Types: Cigarettes    Quit date: 10/31/1983  . Smokeless tobacco: Never Used  . Alcohol Use: No   OB History   Grav Para Term Preterm Abortions TAB SAB Ect Mult Living                 Review of Systems  Constitutional: Negative for fever.  Gastrointestinal: Negative for nausea and vomiting.  Musculoskeletal: Negative for arthralgias and myalgias.  Skin: Positive for rash and wound.  Neurological: Negative for weakness and numbness.      Allergies  Aspirin; Codeine; Coreg; and Penicillins  Home Medications   Prior to Admission medications   Medication Sig Start Date End Date Taking? Authorizing Provider  acetaminophen (TYLENOL) 500 MG tablet Take 1,000 mg by mouth every 6 (six) hours as needed (pain).     Historical Provider, MD  aspirin 81 MG tablet Take 81 mg by mouth daily.    Historical Provider, MD  calcium carbonate (OS-CAL) 600 MG TABS Take 1,800 mg by mouth daily.    Historical Provider, MD  diclofenac sodium (VOLTAREN) 1 % GEL Apply 2 g topically 4 (four) times daily as needed (For pain).  Historical Provider, MD  dicyclomine (BENTYL) 20 MG tablet Take 10 mg by mouth every 6 (six) hours as needed for spasms. For stomach irritability.    Historical Provider, MD  diltiazem (CARDIZEM CD) 180 MG 24 hr capsule Take 1 capsule (180 mg total) by mouth daily. 07/15/14   Thayer Headings, MD  doxycycline (VIBRAMYCIN) 100 MG capsule Take 1 capsule (100 mg total) by mouth 2 (two) times daily. One po bid x 10 days 08/17/14   Malvin Johns, MD  estrogens, conjugated, (PREMARIN) 0.625 MG tablet Take 0.625 mg by mouth daily. Take daily for 21 days then do not take for 7 days.    Historical Provider, MD  ezetimibe-simvastatin (VYTORIN) 10-40 MG per tablet Take 1 tablet by mouth at bedtime.    Historical Provider, MD  levothyroxine (SYNTHROID, LEVOTHROID) 75 MCG tablet Take 75 mcg by mouth daily.       Historical Provider, MD  medroxyPROGESTERone (PROVERA) 2.5 MG tablet Take 5 mg by mouth daily. For 10 days each month    Historical Provider, MD  Multiple Vitamins-Minerals (PRESERVISION AREDS 2) CAPS Take 1 capsule by mouth 2 (two) times daily.    Historical Provider, MD  mupirocin cream (BACTROBAN) 2 % Apply 1 application topically 2 (two) times daily. 08/17/14   Malvin Johns, MD  olopatadine (PATANOL) 0.1 % ophthalmic solution Place 1 drop into both eyes 2 (two) times daily.    Historical Provider, MD  Omega-3 Fatty Acids (FISH OIL) 1000 MG CAPS Take 1 capsule by mouth 3 (three) times daily.    Historical Provider, MD   BP 194/73  Pulse 72  Temp(Src) 98.1 F (36.7 C) (Oral)  Resp 16  Ht 5' (1.524 m)  Wt 150 lb (68.04 kg)  BMI 29.30 kg/m2  SpO2 98% Physical Exam  Constitutional: She appears well-developed and well-nourished.  Cardiovascular: Normal rate.   Pulmonary/Chest: Effort normal.  Musculoskeletal:  Patient has some chronic appearing edema of both legs. She states is unchanged from her baseline. She has a small 0.5 cm blackened scabed area to the medial aspect of her right lower leg. There is about 2 cm of surrounding erythema. There is no induration or fluctuance. There is no streaking up the leg.  Skin: Skin is warm and dry.    ED Course  Procedures (including critical care time) Labs Review Labs Reviewed - No data to display  Imaging Review No results found.   EKG Interpretation None      MDM   Final diagnoses:  Cellulitis and abscess    Patient has an early non-drainable abscess to her medial right leg with a small amount surrounding cellulitis. Her tetanus shot was updated today. She was started on doxycycline and Bactroban ointment. She was encouraged to have the wound rechecked in 2 days by her primary care physician. She was advised to return here if she has any worsening redness or swelling.    Malvin Johns, MD 08/17/14 [phone]

## 2014-08-18 ENCOUNTER — Ambulatory Visit: Payer: Medicare Other | Admitting: Internal Medicine

## 2014-08-24 DIAGNOSIS — Z1212 Encounter for screening for malignant neoplasm of rectum: Secondary | ICD-10-CM | POA: Diagnosis not present

## 2014-08-24 DIAGNOSIS — I350 Nonrheumatic aortic (valve) stenosis: Secondary | ICD-10-CM | POA: Diagnosis not present

## 2014-08-24 DIAGNOSIS — E039 Hypothyroidism, unspecified: Secondary | ICD-10-CM | POA: Diagnosis not present

## 2014-08-24 DIAGNOSIS — L0390 Cellulitis, unspecified: Secondary | ICD-10-CM | POA: Diagnosis not present

## 2014-08-24 DIAGNOSIS — N926 Irregular menstruation, unspecified: Secondary | ICD-10-CM | POA: Diagnosis not present

## 2014-08-24 DIAGNOSIS — I10 Essential (primary) hypertension: Secondary | ICD-10-CM | POA: Diagnosis not present

## 2014-08-24 DIAGNOSIS — E785 Hyperlipidemia, unspecified: Secondary | ICD-10-CM | POA: Diagnosis not present

## 2014-08-24 DIAGNOSIS — K589 Irritable bowel syndrome without diarrhea: Secondary | ICD-10-CM | POA: Diagnosis not present

## 2014-08-24 DIAGNOSIS — Z23 Encounter for immunization: Secondary | ICD-10-CM | POA: Diagnosis not present

## 2014-08-24 DIAGNOSIS — Z0000 Encounter for general adult medical examination without abnormal findings: Secondary | ICD-10-CM | POA: Diagnosis not present

## 2014-08-28 ENCOUNTER — Emergency Department (HOSPITAL_BASED_OUTPATIENT_CLINIC_OR_DEPARTMENT_OTHER)
Admission: EM | Admit: 2014-08-28 | Discharge: 2014-08-28 | Disposition: A | Discharge status: Home / Self Care | Payer: Medicare Other | Attending: Emergency Medicine | Admitting: Emergency Medicine

## 2014-08-28 ENCOUNTER — Encounter (HOSPITAL_BASED_OUTPATIENT_CLINIC_OR_DEPARTMENT_OTHER): Payer: Self-pay | Admitting: Emergency Medicine

## 2014-08-28 DIAGNOSIS — E785 Hyperlipidemia, unspecified: Secondary | ICD-10-CM | POA: Diagnosis not present

## 2014-08-28 DIAGNOSIS — I10 Essential (primary) hypertension: Secondary | ICD-10-CM | POA: Diagnosis not present

## 2014-08-28 DIAGNOSIS — Z79899 Other long term (current) drug therapy: Secondary | ICD-10-CM | POA: Diagnosis not present

## 2014-08-28 DIAGNOSIS — Z87891 Personal history of nicotine dependence: Secondary | ICD-10-CM | POA: Insufficient documentation

## 2014-08-28 DIAGNOSIS — Z9889 Other specified postprocedural states: Secondary | ICD-10-CM | POA: Insufficient documentation

## 2014-08-28 DIAGNOSIS — E039 Hypothyroidism, unspecified: Secondary | ICD-10-CM | POA: Insufficient documentation

## 2014-08-28 DIAGNOSIS — M79661 Pain in right lower leg: Secondary | ICD-10-CM | POA: Diagnosis present

## 2014-08-28 DIAGNOSIS — Z8679 Personal history of other diseases of the circulatory system: Secondary | ICD-10-CM | POA: Diagnosis not present

## 2014-08-28 DIAGNOSIS — Z7982 Long term (current) use of aspirin: Secondary | ICD-10-CM | POA: Diagnosis not present

## 2014-08-28 DIAGNOSIS — L03115 Cellulitis of right lower limb: Secondary | ICD-10-CM | POA: Insufficient documentation

## 2014-08-28 DIAGNOSIS — I4891 Unspecified atrial fibrillation: Secondary | ICD-10-CM | POA: Diagnosis not present

## 2014-08-28 DIAGNOSIS — Z880 Allergy status to penicillin: Secondary | ICD-10-CM | POA: Diagnosis not present

## 2014-08-28 LAB — CBC WITH DIFFERENTIAL/PLATELET
Basophils Absolute: 0 10*3/uL (ref 0.0–0.1)
Basophils Relative: 0 % (ref 0–1)
Eosinophils Absolute: 0.2 10*3/uL (ref 0.0–0.7)
Eosinophils Relative: 2 % (ref 0–5)
HCT: 32.2 % — ABNORMAL LOW (ref 36.0–46.0)
Hemoglobin: 10.6 g/dL — ABNORMAL LOW (ref 12.0–15.0)
Lymphocytes Relative: 19 % (ref 12–46)
Lymphs Abs: 2.1 10*3/uL (ref 0.7–4.0)
MCH: 29.8 pg (ref 26.0–34.0)
MCHC: 32.9 g/dL (ref 30.0–36.0)
MCV: 90.4 fL (ref 78.0–100.0)
Monocytes Absolute: 0.9 10*3/uL (ref 0.1–1.0)
Monocytes Relative: 9 % (ref 3–12)
Neutro Abs: 7.6 10*3/uL (ref 1.7–7.7)
Neutrophils Relative %: 70 % (ref 43–77)
Platelets: 322 10*3/uL (ref 150–400)
RBC: 3.56 MIL/uL — ABNORMAL LOW (ref 3.87–5.11)
RDW: 13.5 % (ref 11.5–15.5)
WBC: 10.8 10*3/uL — ABNORMAL HIGH (ref 4.0–10.5)

## 2014-08-28 LAB — BASIC METABOLIC PANEL
Anion gap: 15 (ref 5–15)
BUN: 34 mg/dL — ABNORMAL HIGH (ref 6–23)
CO2: 22 mEq/L (ref 19–32)
Calcium: 8.9 mg/dL (ref 8.4–10.5)
Chloride: 102 mEq/L (ref 96–112)
Creatinine, Ser: 1 mg/dL (ref 0.50–1.10)
GFR calc Af Amer: 58 mL/min — ABNORMAL LOW (ref >90)
GFR calc non Af Amer: 50 mL/min — ABNORMAL LOW (ref >90)
Glucose, Bld: 116 mg/dL — ABNORMAL HIGH (ref 70–99)
Potassium: 4.3 mEq/L (ref 3.7–5.3)
Sodium: 139 mEq/L (ref 137–147)

## 2014-08-28 MED ORDER — CLINDAMYCIN HCL 300 MG PO CAPS: 300.0000 mg | ORAL_CAPSULE | Freq: Four times a day (QID) | ORAL | Status: DC

## 2014-08-28 MED ORDER — CLINDAMYCIN PHOSPHATE 600 MG/50ML IV SOLN
600.0000 mg | Freq: Once | INTRAVENOUS | Status: AC
  Administered 2014-08-28: 600 mg via INTRAVENOUS
  Filled 2014-08-28: qty 50

## 2014-08-28 NOTE — ED Provider Notes (Signed)
CSN: [id number]     Arrival date & time 08/28/14  1006 History   First MD Initiated Contact with Patient 08/28/14 1122     Chief Complaint  Patient presents with  . Leg Pain    Patient gave verbal permission to utilize photo for medical documentation only The image was not stored on any personal device   Patient is a 80 y.o. female presenting with leg pain. The history is provided by the patient.  Leg Pain Location:  Leg Leg location:  R leg Pain details:    Quality:  Aching   Severity:  Mild   Onset quality:  Gradual   Timing:  Constant   Progression:  Worsening Chronicity:  Recurrent Relieved by:  Rest Worsened by:  Bearing weight Associated symptoms: no fever   Pt reports recent h/o cellulitis to right LE over the past several weeks She took doxycycline with some improvement She recently finished the doxycycline and now the redness is worse  No fever/vomiting No injury to right LE No other new complaints  Past Medical History  Diagnosis Date  . HTN (hypertension)   . HLD (hyperlipidemia)   . Hypothyroidism   . Aortic stenosis, moderate     last echo in 2010; AVA 1.1; mild AS per echo in June 2013  . Edema of both legs     s/p laser treatment per Dr. Donnetta Hutching  . A-fib    Past Surgical History  Procedure Laterality Date  . Appendectomy  1955  . Foot surgery  2003    bilateral Hammer Toe  . Eye surgery  2005    Bilateral cataract  . Endovenous ablation saphenous vein w/ laser  08-29-2012    left greater saphenous vein   Curt Jews MD  . Endovenous ablation saphenous vein w/ laser  09-19-2012    right greater saphenous vein by Curt Jews MD  . Stab phlebectomy Right 01-02-2013    10-15 incisions right thigh and calf by Curt Jews MD   Family History  Problem Relation Age of Onset  . Peripheral vascular disease    . Heart disease Mother   . Peripheral vascular disease Mother   . Heart disease Father   . Peripheral vascular disease Father     Right leg  amputation  . Hypertension Sister   . Heart disease Brother 18    Heart Disease before age 71   History  Substance Use Topics  . Smoking status: Former Smoker    Types: Cigarettes    Quit date: 10/31/1983  . Smokeless tobacco: Never Used  . Alcohol Use: No   OB History   Grav Para Term Preterm Abortions TAB SAB Ect Mult Living                 Review of Systems  Constitutional: Negative for fever.  Gastrointestinal: Negative for vomiting.  All other systems reviewed and are negative.     Allergies  Aspirin; Codeine; Coreg; and Penicillins  Home Medications   Prior to Admission medications   Medication Sig Start Date End Date Taking? Authorizing Provider  acetaminophen (TYLENOL) 500 MG tablet Take 1,000 mg by mouth every 6 (six) hours as needed (pain).     Historical Provider, MD  aspirin 81 MG tablet Take 81 mg by mouth daily.    Historical Provider, MD  calcium carbonate (OS-CAL) 600 MG TABS Take 1,800 mg by mouth daily.    Historical Provider, MD  diclofenac sodium (VOLTAREN) 1 % GEL Apply 2  g topically 4 (four) times daily as needed (For pain).    Historical Provider, MD  dicyclomine (BENTYL) 20 MG tablet Take 10 mg by mouth every 6 (six) hours as needed for spasms. For stomach irritability.    Historical Provider, MD  diltiazem (CARDIZEM CD) 180 MG 24 hr capsule Take 1 capsule (180 mg total) by mouth daily. 07/15/14   Thayer Headings, MD  doxycycline (VIBRAMYCIN) 100 MG capsule Take 1 capsule (100 mg total) by mouth 2 (two) times daily. One po bid x 10 days 08/17/14   Malvin Johns, MD  estrogens, conjugated, (PREMARIN) 0.625 MG tablet Take 0.625 mg by mouth daily. Take daily for 21 days then do not take for 7 days.    Historical Provider, MD  ezetimibe-simvastatin (VYTORIN) 10-40 MG per tablet Take 1 tablet by mouth at bedtime.    Historical Provider, MD  levothyroxine (SYNTHROID, LEVOTHROID) 75 MCG tablet Take 75 mcg by mouth daily.      Historical Provider, MD    medroxyPROGESTERone (PROVERA) 2.5 MG tablet Take 5 mg by mouth daily. For 10 days each month    Historical Provider, MD  Multiple Vitamins-Minerals (PRESERVISION AREDS 2) CAPS Take 1 capsule by mouth 2 (two) times daily.    Historical Provider, MD  mupirocin cream (BACTROBAN) 2 % Apply 1 application topically 2 (two) times daily. 08/17/14   Malvin Johns, MD  olopatadine (PATANOL) 0.1 % ophthalmic solution Place 1 drop into both eyes 2 (two) times daily.    Historical Provider, MD  Omega-3 Fatty Acids (FISH OIL) 1000 MG CAPS Take 1 capsule by mouth 3 (three) times daily.    Historical Provider, MD   BP 183/71  Pulse 84  Temp(Src) 98.4 F (36.9 C) (Oral)  Resp 16  Ht 5' (1.524 m)  Wt 150 lb (68.04 kg)  BMI 29.30 kg/m2  SpO2 97% Physical Exam CONSTITUTIONAL: Well developed/well nourished HEAD: Normocephalic/atraumatic EYES: EOMI/PERRL ENMT: Mucous membranes moist NECK: supple no meningeal signs SPINE:entire spine nontender CV: Z5/G3 noted, systolic ejection murmur noted LUNGS: Lungs are clear to auscultation bilaterally, no apparent distress ABDOMEN: soft, nontender, no rebound or guarding GU:no cva tenderness NEURO: Pt is awake/alert, moves all extremitiesx4 EXTREMITIES: pulses normal, full ROM, no crepitus/fluctuance to right LE.  No streaking noted SKIN: warm, color normal PSYCH: no abnormalities of mood noted    ED Course  Procedures    12:46 PM Will start on clindamycin Advised PCP followup next week No signs of acute DVT Pt is well appearing/not septic and appopriate for d/c home  Labs Review Labs Reviewed  BASIC METABOLIC PANEL - Abnormal; Notable for the following:    Glucose, Bld 116 (*)    BUN 34 (*)    GFR calc non Af Amer 50 (*)    GFR calc Af Amer 58 (*)    All other components within normal limits  CBC WITH DIFFERENTIAL - Abnormal; Notable for the following:    WBC 10.8 (*)    RBC 3.56 (*)    Hemoglobin 10.6 (*)    HCT 32.2 (*)    All other  components within normal limits    MDM   Final diagnoses:  None    Nursing notes including past medical history and social history reviewed and considered in documentation Labs/vital reviewed and considered Previous records reviewed and considered     Sharyon Cable, MD 08/28/14 1246

## 2014-08-28 NOTE — ED Notes (Signed)
Redness and pain to right LE that started 2 weeks ago.  Was evaluated and treated with Doxycycline, however no improvement.

## 2014-09-01 ENCOUNTER — Encounter (HOSPITAL_BASED_OUTPATIENT_CLINIC_OR_DEPARTMENT_OTHER): Payer: Self-pay | Admitting: *Deleted

## 2014-09-01 ENCOUNTER — Inpatient Hospital Stay (HOSPITAL_BASED_OUTPATIENT_CLINIC_OR_DEPARTMENT_OTHER)
Admission: EM | Admit: 2014-09-01 | Discharge: 2014-09-06 | DRG: 603 | Disposition: A | Discharge status: Home / Self Care | Payer: Medicare Other | Attending: Internal Medicine | Admitting: Internal Medicine

## 2014-09-01 DIAGNOSIS — L0390 Cellulitis, unspecified: Secondary | ICD-10-CM | POA: Diagnosis present

## 2014-09-01 DIAGNOSIS — Z9049 Acquired absence of other specified parts of digestive tract: Secondary | ICD-10-CM | POA: Diagnosis present

## 2014-09-01 DIAGNOSIS — Z880 Allergy status to penicillin: Secondary | ICD-10-CM

## 2014-09-01 DIAGNOSIS — E039 Hypothyroidism, unspecified: Secondary | ICD-10-CM | POA: Diagnosis present

## 2014-09-01 DIAGNOSIS — Z9842 Cataract extraction status, left eye: Secondary | ICD-10-CM

## 2014-09-01 DIAGNOSIS — L03116 Cellulitis of left lower limb: Principal | ICD-10-CM | POA: Diagnosis present

## 2014-09-01 DIAGNOSIS — I480 Paroxysmal atrial fibrillation: Secondary | ICD-10-CM | POA: Diagnosis present

## 2014-09-01 DIAGNOSIS — Z9841 Cataract extraction status, right eye: Secondary | ICD-10-CM | POA: Diagnosis not present

## 2014-09-01 DIAGNOSIS — Z87891 Personal history of nicotine dependence: Secondary | ICD-10-CM | POA: Diagnosis not present

## 2014-09-01 DIAGNOSIS — Z886 Allergy status to analgesic agent status: Secondary | ICD-10-CM | POA: Diagnosis not present

## 2014-09-01 DIAGNOSIS — L03115 Cellulitis of right lower limb: Secondary | ICD-10-CM

## 2014-09-01 DIAGNOSIS — K922 Gastrointestinal hemorrhage, unspecified: Secondary | ICD-10-CM | POA: Diagnosis present

## 2014-09-01 DIAGNOSIS — I83009 Varicose veins of unspecified lower extremity with ulcer of unspecified site: Secondary | ICD-10-CM | POA: Diagnosis present

## 2014-09-01 DIAGNOSIS — I776 Arteritis, unspecified: Secondary | ICD-10-CM | POA: Diagnosis present

## 2014-09-01 DIAGNOSIS — D649 Anemia, unspecified: Secondary | ICD-10-CM | POA: Diagnosis present

## 2014-09-01 DIAGNOSIS — Z7982 Long term (current) use of aspirin: Secondary | ICD-10-CM | POA: Diagnosis not present

## 2014-09-01 DIAGNOSIS — I350 Nonrheumatic aortic (valve) stenosis: Secondary | ICD-10-CM | POA: Diagnosis present

## 2014-09-01 DIAGNOSIS — E785 Hyperlipidemia, unspecified: Secondary | ICD-10-CM | POA: Diagnosis present

## 2014-09-01 DIAGNOSIS — L03119 Cellulitis of unspecified part of limb: Secondary | ICD-10-CM | POA: Diagnosis present

## 2014-09-01 DIAGNOSIS — L97909 Non-pressure chronic ulcer of unspecified part of unspecified lower leg with unspecified severity: Secondary | ICD-10-CM | POA: Diagnosis present

## 2014-09-01 DIAGNOSIS — Z9119 Patient's noncompliance with other medical treatment and regimen: Secondary | ICD-10-CM | POA: Diagnosis present

## 2014-09-01 DIAGNOSIS — I872 Venous insufficiency (chronic) (peripheral): Secondary | ICD-10-CM | POA: Diagnosis present

## 2014-09-01 DIAGNOSIS — I10 Essential (primary) hypertension: Secondary | ICD-10-CM | POA: Diagnosis present

## 2014-09-01 DIAGNOSIS — I4819 Other persistent atrial fibrillation: Secondary | ICD-10-CM | POA: Diagnosis present

## 2014-09-01 DIAGNOSIS — Z9114 Patient's other noncompliance with medication regimen: Secondary | ICD-10-CM | POA: Diagnosis present

## 2014-09-01 DIAGNOSIS — I482 Chronic atrial fibrillation, unspecified: Secondary | ICD-10-CM | POA: Diagnosis present

## 2014-09-01 LAB — CBC
HCT: 34.9 % — ABNORMAL LOW (ref 36.0–46.0)
Hemoglobin: 11.5 g/dL — ABNORMAL LOW (ref 12.0–15.0)
MCH: 29 pg (ref 26.0–34.0)
MCHC: 33 g/dL (ref 30.0–36.0)
MCV: 87.9 fL (ref 78.0–100.0)
Platelets: 370 10*3/uL (ref 150–400)
RBC: 3.97 MIL/uL (ref 3.87–5.11)
RDW: 13.2 % (ref 11.5–15.5)
WBC: 8.6 10*3/uL (ref 4.0–10.5)

## 2014-09-01 LAB — CBC WITH DIFFERENTIAL/PLATELET
Basophils Absolute: 0 10*3/uL (ref 0.0–0.1)
Basophils Relative: 0 % (ref 0–1)
Eosinophils Absolute: 0.3 10*3/uL (ref 0.0–0.7)
Eosinophils Relative: 3 % (ref 0–5)
HCT: 34.9 % — ABNORMAL LOW (ref 36.0–46.0)
Hemoglobin: 11.5 g/dL — ABNORMAL LOW (ref 12.0–15.0)
Lymphocytes Relative: 22 % (ref 12–46)
Lymphs Abs: 2.1 10*3/uL (ref 0.7–4.0)
MCH: 29.6 pg (ref 26.0–34.0)
MCHC: 33 g/dL (ref 30.0–36.0)
MCV: 89.7 fL (ref 78.0–100.0)
Monocytes Absolute: 0.8 10*3/uL (ref 0.1–1.0)
Monocytes Relative: 8 % (ref 3–12)
Neutro Abs: 6.6 10*3/uL (ref 1.7–7.7)
Neutrophils Relative %: 67 % (ref 43–77)
Platelets: 356 10*3/uL (ref 150–400)
RBC: 3.89 MIL/uL (ref 3.87–5.11)
RDW: 13.2 % (ref 11.5–15.5)
WBC: 9.8 10*3/uL (ref 4.0–10.5)

## 2014-09-01 LAB — COMPREHENSIVE METABOLIC PANEL
ALT: 7 U/L (ref 0–35)
AST: 21 U/L (ref 0–37)
Albumin: 3 g/dL — ABNORMAL LOW (ref 3.5–5.2)
Alkaline Phosphatase: 75 U/L (ref 39–117)
Anion gap: 13 (ref 5–15)
BUN: 26 mg/dL — ABNORMAL HIGH (ref 6–23)
CO2: 26 mEq/L (ref 19–32)
Calcium: 9.7 mg/dL (ref 8.4–10.5)
Chloride: 98 mEq/L (ref 96–112)
Creatinine, Ser: 1.1 mg/dL (ref 0.50–1.10)
GFR calc Af Amer: 52 mL/min — ABNORMAL LOW (ref >90)
GFR calc non Af Amer: 44 mL/min — ABNORMAL LOW (ref >90)
Glucose, Bld: 106 mg/dL — ABNORMAL HIGH (ref 70–99)
Potassium: 4.6 mEq/L (ref 3.7–5.3)
Sodium: 137 mEq/L (ref 137–147)
Total Bilirubin: 0.3 mg/dL (ref 0.3–1.2)
Total Protein: 7.2 g/dL (ref 6.0–8.3)

## 2014-09-01 LAB — CREATININE, SERUM
Creatinine, Ser: 1.06 mg/dL (ref 0.50–1.10)
GFR calc Af Amer: 54 mL/min — ABNORMAL LOW (ref >90)
GFR calc non Af Amer: 47 mL/min — ABNORMAL LOW (ref >90)

## 2014-09-01 MED ORDER — VANCOMYCIN HCL IN DEXTROSE 750-5 MG/150ML-% IV SOLN
750.0000 mg | INTRAVENOUS | Status: DC
  Administered 2014-09-02 – 2014-09-03 (×2): 750 mg via INTRAVENOUS
  Filled 2014-09-01 (×3): qty 150

## 2014-09-01 MED ORDER — CALCIUM CARBONATE 1250 (500 CA) MG PO TABS
3.0000 | ORAL_TABLET | Freq: Every day | ORAL | Status: DC
  Administered 2014-09-01 – 2014-09-06 (×6): 1500 mg via ORAL
  Filled 2014-09-01 (×6): qty 3

## 2014-09-01 MED ORDER — ACETAMINOPHEN 500 MG PO TABS
1000.0000 mg | ORAL_TABLET | Freq: Four times a day (QID) | ORAL | Status: DC | PRN
Start: 2014-09-01 — End: 2014-09-06
  Administered 2014-09-02 – 2014-09-06 (×8): 1000 mg via ORAL
  Filled 2014-09-01 (×8): qty 2

## 2014-09-01 MED ORDER — LEVOTHYROXINE SODIUM 75 MCG PO TABS
75.0000 ug | ORAL_TABLET | Freq: Every day | ORAL | Status: DC
  Administered 2014-09-02 – 2014-09-06 (×5): 75 ug via ORAL
  Filled 2014-09-01 (×6): qty 1

## 2014-09-01 MED ORDER — ASPIRIN 81 MG PO CHEW
81.0000 mg | CHEWABLE_TABLET | Freq: Every day | ORAL | Status: DC
  Administered 2014-09-01 – 2014-09-06 (×6): 81 mg via ORAL
  Filled 2014-09-01 (×6): qty 1

## 2014-09-01 MED ORDER — DICYCLOMINE HCL 20 MG PO TABS
10.0000 mg | ORAL_TABLET | Freq: Four times a day (QID) | ORAL | Status: DC | PRN
  Filled 2014-09-01: qty 1

## 2014-09-01 MED ORDER — HEPARIN SODIUM (PORCINE) 5000 UNIT/ML IJ SOLN
5000.0000 [IU] | Freq: Three times a day (TID) | INTRAMUSCULAR | Status: DC
  Administered 2014-09-01 – 2014-09-06 (×13): 5000 [IU] via SUBCUTANEOUS
  Filled 2014-09-01 (×17): qty 1

## 2014-09-01 MED ORDER — AMLODIPINE BESYLATE 5 MG PO TABS
5.0000 mg | ORAL_TABLET | Freq: Every day | ORAL | Status: DC
  Administered 2014-09-01: 5 mg via ORAL
  Filled 2014-09-01: qty 1

## 2014-09-01 MED ORDER — VANCOMYCIN HCL IN DEXTROSE 1-5 GM/200ML-% IV SOLN
1000.0000 mg | Freq: Once | INTRAVENOUS | Status: AC
  Administered 2014-09-01: 1000 mg via INTRAVENOUS
  Filled 2014-09-01: qty 200

## 2014-09-01 MED ORDER — DICYCLOMINE HCL 10 MG PO CAPS
10.0000 mg | ORAL_CAPSULE | Freq: Four times a day (QID) | ORAL | Status: DC | PRN
  Filled 2014-09-01: qty 1

## 2014-09-01 MED ORDER — SODIUM CHLORIDE 0.9 % IJ SOLN
3.0000 mL | Freq: Two times a day (BID) | INTRAMUSCULAR | Status: DC
  Administered 2014-09-02 – 2014-09-06 (×7): 3 mL via INTRAVENOUS

## 2014-09-01 MED ORDER — SODIUM CHLORIDE 0.9 % IV SOLN
INTRAVENOUS | Status: DC
  Administered 2014-09-01 – 2014-09-02 (×2): via INTRAVENOUS

## 2014-09-01 MED ORDER — EZETIMIBE-SIMVASTATIN 10-40 MG PO TABS
1.0000 | ORAL_TABLET | Freq: Every day | ORAL | Status: DC
  Administered 2014-09-01: 1 via ORAL
  Filled 2014-09-01 (×2): qty 1

## 2014-09-01 NOTE — Progress Notes (Signed)
ANTIBIOTIC CONSULT NOTE - INITIAL  Pharmacy Consult for Vancomycin Indication: Cellulitis  Allergies  Allergen Reactions  . Aspirin Nausea And Vomiting    Low Dose is ok  . Codeine Nausea And Vomiting    "Deathly Sick"  . Coreg [Carvedilol] Other (See Comments)    Fatigue/ ill  . Penicillins Rash    Patient Measurements: Height: 5' (152.4 cm) Weight: 150 lb (68.04 kg) IBW/kg (Calculated) : 45.5  Vital Signs: Temp: 98.4 F (36.9 C) (11/03 1905) Temp Source: Oral (11/03 1905) BP: 188/80 mmHg (11/03 1905) Pulse Rate: 82 (11/03 1905) Intake/Output from previous day:   Intake/Output from this shift:    Labs:  Recent Labs  09/01/14 1533  WBC 9.8  HGB 11.5*  PLT 356  CREATININE 1.10   Estimated Creatinine Clearance: 32.2 mL/min (by C-G formula based on Cr of 1.1). No results for input(s): VANCOTROUGH, VANCOPEAK, VANCORANDOM, GENTTROUGH, GENTPEAK, GENTRANDOM, TOBRATROUGH, TOBRAPEAK, TOBRARND, AMIKACINPEAK, AMIKACINTROU, AMIKACIN in the last 72 hours.   Microbiology: No results found for this or any previous visit (from the past 720 hour(s)).  Medical History: Past Medical History  Diagnosis Date  . HTN (hypertension)   . HLD (hyperlipidemia)   . Hypothyroidism   . Aortic stenosis, moderate     last echo in 2010; AVA 1.1; mild AS per echo in June 2013  . Edema of both legs     s/p laser treatment per Dr. Donnetta Hutching  . A-fib     Medications:  Scheduled:  . aspirin  81 mg Oral Daily  . calcium carbonate  1,800 mg Oral Daily  . ezetimibe-simvastatin  1 tablet Oral QHS  . heparin  5,000 Units Subcutaneous 3 times per day  . levothyroxine  75 mcg Oral Daily  . sodium chloride  3 mL Intravenous Q12H   Infusions:  . sodium chloride     Assessment:  80 yr female presents with recurrent LE cellulitis.  Noted failure of outpatient clindamycin.  Afebrile  WBC 9.8  Scr = 1.1 with CrCl ~ 32 ml/min  Vancomycin 1gm IV x 1 given in ED @ 15:42  Pharmacy consulted  to dose Vancomycin for cellulitis  Goal of Therapy:  Vancomycin trough level 10-15 mcg/ml  Plan:  Measure antibiotic drug levels at steady state Follow up culture results  Vancomycin 750mg  IV q24h  Tondalaya Perren, Toribio Harbour, PharmD 09/01/2014,8:17 PM

## 2014-09-01 NOTE — Progress Notes (Signed)
Patient presents with recurrent cellulitis LE. Fail out patient clindamycin. Per ED patient stable for Med-surgery bed.

## 2014-09-01 NOTE — H&P (Addendum)
Hospitalist Admission History and Physical  Patient name: [name] Medical record number: [id number] Date of birth: 02-16-1927 Age: 80 y.o. Gender: female  Primary Care Provider: Rachell Cipro, MD  Chief Complaint: recurrent cellulitis,  History of Present Illness:This is a 80 y.o. year old female with significant past medical history of recurrent LE cellulitis, venous insufficiency, moderate aortic stenosis, atrial fibrillation-now NSR not on anticoagulation  presenting with recurrentl LE cellulitis. Pt reports being admitted in the past (03/2014) for recurrent cellulitis. Had failed outpt clindamycin at the time. Required IV vancomycin and was discharged on extended course of doxy. Cellulitis involved LLE at the time. Pt states that she has had progressive RLE redness and pain over past 2-3 days. Denies any fevers or chills. Non diabetic. Non smoker. No recent trauma to affected area. States that she was initially seen on 08/17/2014 for sxs. Was placed on outpt doxy. Sxs minimally improved. Was seen in ER on 10/30 for follow up. Placed on clindamycin with worsening of sxs.   Afebrile on presentation. BPs 150s-200s. HR 60s-80s. Satting >96% on RA. WBC 9.8, Hgb 11.5, Cr 1.1. Started on vanc at [facility].  Pt noted to have been admitted last month for new onset afib.  Converted to NSR. Was d/c'd on diltiazem. Pt states that she is not taking cardizem because " I dont feel good when I take it".Denies any CP, SOB, palpitations.   Assessment and Plan: [name] is a 80 y.o. year old female presenting with recurrent cellulitis   Active Problems:   Cellulitis   Recurrent cellulitis of lower leg   1- Recurrent Cellulitis -IV vanc  - cont to follow  -may need ID c/s +/- wound/vascular as this has been a recurrent issue ( ? Element of venous stasis ulcer) -cont to follow   2- Afib/HTN -NSR on EKG -noted  hx/o GIB on xarelto 2 months ago-likely not anticoagulation candidate. No  GI complaints currently.  -elevated BPs in setting of medication noncompliance  -refusing cardizem " i dont feel good when I take it-I dont like to take medicine" -hold oral estrogens as this may have deleterious effect on BP.  - low dose norvasc (ARB d/cd during last hospitalization)  -cards consult as clinically indicated -currently no cardiac complaints  -tele bed   3- Aortic stenosis -moderate disease -ECHO 12/2013 EF 60-65%, mild LVH, mild aortic stenosis   4- Anemia -hgb stable  -no overt signs of bleeding -noted hx/o GIB assd with anticoagulation in the past  -follow   5- GIB -assd w/ xarelto in the past -not on anticoagulation -no melena per pt -hgb stable  -follow   FEN/GI: heart healthy diet  Prophylaxis: sub q heparin  Disposition: pending further evaluation  Code Status:Full Code    Patient Active Problem List   Diagnosis Date Noted  . Recurrent cellulitis of lower leg 09/01/2014  . Rectal bleeding 05/15/2014  . Lower GI bleed 05/15/2014  . Atrial fibrillation 05/11/2014  . HLD (hyperlipidemia) 04/02/2014  . Unspecified hypothyroidism 04/02/2014  . Left leg cellulitis 04/01/2014  . Cellulitis 04/01/2014  . Pain in limb 02/19/2014  . PAC (premature atrial contraction) 01/23/2014  . Atrial tachycardia, paroxysmal 01/23/2014  . Near syncope 01/22/2014  . Syncope 01/22/2014  . Varicose veins of lower extremities with other complications 63/87/5643  . Bilateral venous insufficiency 04/19/2012  . Leg edema 01/17/2012  . Aortic stenosis 03/22/2011  . HTN (hypertension) 03/22/2011   Past Medical History: Past Medical History  Diagnosis Date  .  HTN (hypertension)   . HLD (hyperlipidemia)   . Hypothyroidism   . Aortic stenosis, moderate     last echo in 2010; AVA 1.1; mild AS per echo in June 2013  . Edema of both legs     s/p laser treatment per Dr. Donnetta Hutching  . A-fib     Past Surgical History: Past Surgical History  Procedure Laterality Date  .  Appendectomy  1955  . Foot surgery  2003    bilateral Hammer Toe  . Eye surgery  2005    Bilateral cataract  . Endovenous ablation saphenous vein w/ laser  08-29-2012    left greater saphenous vein   Curt Jews MD  . Endovenous ablation saphenous vein w/ laser  09-19-2012    right greater saphenous vein by Curt Jews MD  . Stab phlebectomy Right 01-02-2013    10-15 incisions right thigh and calf by Curt Jews MD    Social History: History   Social History  . Marital Status: Widowed    Spouse Name: N/A    Number of Children: 2  . Years of Education: N/A   Occupational History  . retired    Social History Main Topics  . Smoking status: Former Smoker    Types: Cigarettes    Quit date: 10/31/1983  . Smokeless tobacco: Never Used  . Alcohol Use: No  . Drug Use: No  . Sexual Activity: No   Other Topics Concern  . None   Social History Narrative   Lives by herself     Family History: Family History  Problem Relation Age of Onset  . Peripheral vascular disease    . Heart disease Mother   . Peripheral vascular disease Mother   . Heart disease Father   . Peripheral vascular disease Father     Right leg amputation  . Hypertension Sister   . Heart disease Brother 85    Heart Disease before age 82    Allergies: Allergies  Allergen Reactions  . Aspirin Nausea And Vomiting    Low Dose is ok  . Codeine Nausea And Vomiting    "Deathly Sick"  . Coreg [Carvedilol] Other (See Comments)    Fatigue/ ill  . Penicillins Rash    Current Facility-Administered Medications  Medication Dose Route Frequency Provider Last Rate Last Dose  . 0.9 %  sodium chloride infusion   Intravenous Continuous Shanda Howells, MD      . acetaminophen (TYLENOL) tablet 1,000 mg  1,000 mg Oral Q6H PRN Shanda Howells, MD      . aspirin tablet 81 mg  81 mg Oral Daily Shanda Howells, MD      . calcium carbonate (OS-CAL) tablet 1,800 mg  1,800 mg Oral Daily Shanda Howells, MD      .  ezetimibe-simvastatin (VYTORIN) 10-40 MG per tablet 1 tablet  1 tablet Oral QHS Shanda Howells, MD      . heparin injection 5,000 Units  5,000 Units Subcutaneous 3 times per day Shanda Howells, MD      . levothyroxine (SYNTHROID, LEVOTHROID) tablet 75 mcg  75 mcg Oral Daily Shanda Howells, MD      . sodium chloride 0.9 % injection 3 mL  3 mL Intravenous Q12H Shanda Howells, MD       Review Of Systems: 12 point ROS negative except as noted above in HPI.  Physical Exam: Filed Vitals:   09/01/14 1905  BP: 188/80  Pulse: 82  Temp: 98.4 F (36.9 C)  Resp: 20  General: alert and cooperative HEENT: PERRLA and extra ocular movement intact Heart: S1, S2 normal, no murmur, rub or gallop, regular rate and rhythm Lungs: clear to auscultation, no wheezes or rales and unlabored breathing Abdomen: abdomen is soft without significant tenderness, masses, organomegaly or guarding Extremities: extremities normal, atraumatic, no cyanosis or edema Skin:see pic   Neurology: normal without focal findings  Labs and Imaging: Lab Results  Component Value Date/Time   NA 137 09/01/2014 03:33 PM   K 4.6 09/01/2014 03:33 PM   CL 98 09/01/2014 03:33 PM   CO2 26 09/01/2014 03:33 PM   BUN 26* 09/01/2014 03:33 PM   CREATININE 1.10 09/01/2014 03:33 PM   GLUCOSE 106* 09/01/2014 03:33 PM   Lab Results  Component Value Date   WBC 9.8 09/01/2014   HGB 11.5* 09/01/2014   HCT 34.9* 09/01/2014   MCV 89.7 09/01/2014   PLT 356 09/01/2014    No results found.         Shanda Howells MD  Pager: [phone]

## 2014-09-01 NOTE — ED Provider Notes (Signed)
CSN: [id number]     Arrival date & time 09/01/14  1414 History   First MD Initiated Contact with Patient 09/01/14 1459     Chief Complaint  Patient presents with  . Leg Pain     (Consider location/radiation/quality/duration/timing/severity/associated sxs/prior Treatment) HPI Comments: Patient presents with worsening right lower leg pain, redness and swelling for the past 4 days. She was seen in the ED on October 30 started on clindamycin. She is a history of recurrent cellulitis to her leg and has been on doxycycline in the past. Denies any fever, chills, nausea or vomiting. No chest pain, shortness of breath. No focal weakness, numbness or tingling.  The history is provided by the patient and a relative.    Past Medical History  Diagnosis Date  . HTN (hypertension)   . HLD (hyperlipidemia)   . Hypothyroidism   . Aortic stenosis, moderate     last echo in 2010; AVA 1.1; mild AS per echo in June 2013  . Edema of both legs     s/p laser treatment per Dr. Donnetta Hutching  . A-fib    Past Surgical History  Procedure Laterality Date  . Appendectomy  1955  . Foot surgery  2003    bilateral Hammer Toe  . Eye surgery  2005    Bilateral cataract  . Endovenous ablation saphenous vein w/ laser  08-29-2012    left greater saphenous vein   Curt Jews MD  . Endovenous ablation saphenous vein w/ laser  09-19-2012    right greater saphenous vein by Curt Jews MD  . Stab phlebectomy Right 01-02-2013    10-15 incisions right thigh and calf by Curt Jews MD   Family History  Problem Relation Age of Onset  . Peripheral vascular disease    . Heart disease Mother   . Peripheral vascular disease Mother   . Heart disease Father   . Peripheral vascular disease Father     Right leg amputation  . Hypertension Sister   . Heart disease Brother 38    Heart Disease before age 51   History  Substance Use Topics  . Smoking status: Former Smoker    Types: Cigarettes    Quit date: 10/31/1983  . Smokeless  tobacco: Never Used  . Alcohol Use: No   OB History    No data available     Review of Systems  Constitutional: Negative for fever, chills, activity change and appetite change.  Respiratory: Negative for cough, chest tightness and shortness of breath.   Cardiovascular: Negative for chest pain.  Gastrointestinal: Negative for nausea, vomiting and abdominal pain.  Genitourinary: Negative for dysuria and hematuria.  Musculoskeletal: Negative for myalgias, back pain and arthralgias.  Skin: Positive for rash and wound.  Neurological: Negative for dizziness, weakness and headaches.  A complete 10 system review of systems was obtained and all systems are negative except as noted in the HPI and PMH.      Allergies  Aspirin; Codeine; Coreg; and Penicillins  Home Medications   Prior to Admission medications   Medication Sig Start Date End Date Taking? Authorizing Provider  acetaminophen (TYLENOL) 500 MG tablet Take 1,000 mg by mouth every 6 (six) hours as needed (pain).    Yes Historical Provider, MD  aspirin 81 MG chewable tablet Chew 81 mg by mouth daily.   Yes Historical Provider, MD  calcium carbonate (OS-CAL) 600 MG TABS Take 1,800 mg by mouth daily.   Yes Historical Provider, MD  clindamycin (CLEOCIN) 300 MG  capsule Take 1 capsule (300 mg total) by mouth 4 (four) times daily. X 7 days Patient taking differently: Take 300 mg by mouth 4 (four) times daily. 7 day therapy course patient began on 08/28/2014 and completed on 11.3.2015 08/28/14  Yes Sharyon Cable, MD  cyanocobalamin 500 MCG tablet Take 500 mcg by mouth daily.   Yes Historical Provider, MD  diclofenac sodium (VOLTAREN) 1 % GEL Apply 2 g topically 4 (four) times daily as needed (For pain).   Yes Historical Provider, MD  dicyclomine (BENTYL) 20 MG tablet Take 10 mg by mouth every 6 (six) hours as needed for spasms. For stomach irritability.   Yes Historical Provider, MD  diltiazem (CARDIZEM CD) 180 MG 24 hr capsule Take 1  capsule (180 mg total) by mouth daily. 07/15/14  Yes Thayer Headings, MD  estrogens, conjugated, (PREMARIN) 0.625 MG tablet Take 0.625 mg by mouth daily.    Yes Historical Provider, MD  ezetimibe-simvastatin (VYTORIN) 10-40 MG per tablet Take 1 tablet by mouth at bedtime.   Yes Historical Provider, MD  levothyroxine (SYNTHROID, LEVOTHROID) 75 MCG tablet Take 75 mcg by mouth daily.     Yes Historical Provider, MD  Losartan Potassium-HCTZ (HYZAAR PO) Take by mouth.   Yes Historical Provider, MD  losartan-hydrochlorothiazide (HYZAAR) 100-12.5 MG per tablet Take 1 tablet by mouth daily.   Yes Historical Provider, MD  medroxyPROGESTERone (PROVERA) 2.5 MG tablet Take 5 mg by mouth as directed. For 10 days each month 20th-30th   Yes Historical Provider, MD  Multiple Vitamins-Minerals (PRESERVISION AREDS 2) CAPS Take 1 capsule by mouth 2 (two) times daily.   Yes Historical Provider, MD  mupirocin cream (BACTROBAN) 2 % Apply 1 application topically 2 (two) times daily. 08/17/14  Yes Malvin Johns, MD  olopatadine (PATANOL) 0.1 % ophthalmic solution Place 1 drop into both eyes 2 (two) times daily.   Yes Historical Provider, MD  Omega-3 Fatty Acids (FISH OIL) 1000 MG CAPS Take 1 capsule by mouth daily.    Yes Historical Provider, MD   BP 195/77 mmHg  Pulse 79  Temp(Src) 98.5 F (36.9 C) (Oral)  Resp 20  Ht 5' (1.524 m)  Wt 150 lb (68.04 kg)  BMI 29.30 kg/m2  SpO2 100% Physical Exam  Constitutional: She is oriented to person, place, and time. She appears well-developed and well-nourished. No distress.  HENT:  Head: Normocephalic and atraumatic.  Mouth/Throat: Oropharynx is clear and moist. No oropharyngeal exudate.  Eyes: Conjunctivae and EOM are normal. Pupils are equal, round, and reactive to light.  Neck: Normal range of motion. Neck supple.  No meningismus.  Cardiovascular: Normal rate, regular rhythm, normal heart sounds and intact distal pulses.   No murmur heard. Pulmonary/Chest: Effort  normal and breath sounds normal. No respiratory distress.  Abdominal: Soft. There is no tenderness. There is no rebound and no guarding.  Musculoskeletal: Normal range of motion. She exhibits tenderness. She exhibits no edema.  Worsening cellulitis and erythema to right lower leg see photo. No fluctuance intact DP and PT pulses   Neurological: She is alert and oriented to person, place, and time. No cranial nerve deficit. She exhibits normal muscle tone. Coordination normal.  No ataxia on finger to nose bilaterally. No pronator drift. 5/5 strength throughout. CN 2-12 intact. Negative Romberg. Equal grip strength. Sensation intact. Gait is normal.   Skin: Skin is warm. No rash noted.  Psychiatric: She has a normal mood and affect. Her behavior is normal.  Nursing note and vitals reviewed.  ED Course  Procedures (including critical care time) Labs Review Labs Reviewed  COMPREHENSIVE METABOLIC PANEL - Abnormal; Notable for the following:    Glucose, Bld 106 (*)    BUN 26 (*)    Albumin 3.0 (*)    GFR calc non Af Amer 44 (*)    GFR calc Af Amer 52 (*)    All other components within normal limits  CBC WITH DIFFERENTIAL - Abnormal; Notable for the following:    Hemoglobin 11.5 (*)    HCT 34.9 (*)    All other components within normal limits  CBC - Abnormal; Notable for the following:    Hemoglobin 11.5 (*)    HCT 34.9 (*)    All other components within normal limits  CREATININE, SERUM - Abnormal; Notable for the following:    GFR calc non Af Amer 47 (*)    GFR calc Af Amer 54 (*)    All other components within normal limits  WOUND CULTURE  CBC WITH DIFFERENTIAL  COMPREHENSIVE METABOLIC PANEL    Imaging Review No results found.   EKG Interpretation None      MDM   Final diagnoses:  Cellulitis of right leg  lower extremity cellulitis failing outpatient treatment. No systemic symptoms.  Patient appears to be failing outpatient treatment.  IV vancomycin started and  blood work obtained. No evidence of DVT. ADmission for IV antibiotics dw Dr. Frederic Jericho.  Patient prefers [facility].  Ezequiel Essex, MD 09/02/14 [phone]

## 2014-09-01 NOTE — ED Notes (Signed)
Pt c/o right lower leg pain with swelling and redness seen here 10/16 for same, completed ABX today has not f/u with PMD

## 2014-09-01 NOTE — ED Notes (Signed)
Called [facility] to give report, nurse stated she would call me back for report

## 2014-09-01 NOTE — ED Notes (Signed)
Report called to care link 

## 2014-09-02 DIAGNOSIS — D649 Anemia, unspecified: Secondary | ICD-10-CM | POA: Diagnosis present

## 2014-09-02 DIAGNOSIS — I10 Essential (primary) hypertension: Secondary | ICD-10-CM

## 2014-09-02 DIAGNOSIS — L97909 Non-pressure chronic ulcer of unspecified part of unspecified lower leg with unspecified severity: Secondary | ICD-10-CM | POA: Diagnosis present

## 2014-09-02 DIAGNOSIS — L03115 Cellulitis of right lower limb: Secondary | ICD-10-CM | POA: Diagnosis not present

## 2014-09-02 DIAGNOSIS — L03119 Cellulitis of unspecified part of limb: Secondary | ICD-10-CM | POA: Diagnosis not present

## 2014-09-02 DIAGNOSIS — I480 Paroxysmal atrial fibrillation: Secondary | ICD-10-CM | POA: Diagnosis not present

## 2014-09-02 DIAGNOSIS — K922 Gastrointestinal hemorrhage, unspecified: Secondary | ICD-10-CM | POA: Diagnosis present

## 2014-09-02 DIAGNOSIS — E039 Hypothyroidism, unspecified: Secondary | ICD-10-CM | POA: Diagnosis present

## 2014-09-02 DIAGNOSIS — L03116 Cellulitis of left lower limb: Secondary | ICD-10-CM | POA: Diagnosis not present

## 2014-09-02 DIAGNOSIS — Z87891 Personal history of nicotine dependence: Secondary | ICD-10-CM | POA: Diagnosis not present

## 2014-09-02 DIAGNOSIS — Z880 Allergy status to penicillin: Secondary | ICD-10-CM | POA: Diagnosis not present

## 2014-09-02 DIAGNOSIS — Z9114 Patient's other noncompliance with medication regimen: Secondary | ICD-10-CM | POA: Diagnosis present

## 2014-09-02 DIAGNOSIS — Z9119 Patient's noncompliance with other medical treatment and regimen: Secondary | ICD-10-CM | POA: Diagnosis present

## 2014-09-02 DIAGNOSIS — Z7982 Long term (current) use of aspirin: Secondary | ICD-10-CM | POA: Diagnosis not present

## 2014-09-02 DIAGNOSIS — I83009 Varicose veins of unspecified lower extremity with ulcer of unspecified site: Secondary | ICD-10-CM | POA: Diagnosis present

## 2014-09-02 DIAGNOSIS — I776 Arteritis, unspecified: Secondary | ICD-10-CM | POA: Diagnosis present

## 2014-09-02 DIAGNOSIS — Z9842 Cataract extraction status, left eye: Secondary | ICD-10-CM | POA: Diagnosis not present

## 2014-09-02 DIAGNOSIS — E785 Hyperlipidemia, unspecified: Secondary | ICD-10-CM | POA: Diagnosis present

## 2014-09-02 DIAGNOSIS — I350 Nonrheumatic aortic (valve) stenosis: Secondary | ICD-10-CM | POA: Diagnosis present

## 2014-09-02 DIAGNOSIS — Z9841 Cataract extraction status, right eye: Secondary | ICD-10-CM | POA: Diagnosis not present

## 2014-09-02 DIAGNOSIS — Z886 Allergy status to analgesic agent status: Secondary | ICD-10-CM | POA: Diagnosis not present

## 2014-09-02 DIAGNOSIS — I872 Venous insufficiency (chronic) (peripheral): Secondary | ICD-10-CM | POA: Diagnosis present

## 2014-09-02 DIAGNOSIS — Z9049 Acquired absence of other specified parts of digestive tract: Secondary | ICD-10-CM | POA: Diagnosis present

## 2014-09-02 LAB — COMPREHENSIVE METABOLIC PANEL
ALT: 6 U/L (ref 0–35)
AST: 15 U/L (ref 0–37)
Albumin: 2.7 g/dL — ABNORMAL LOW (ref 3.5–5.2)
Alkaline Phosphatase: 69 U/L (ref 39–117)
Anion gap: 11 (ref 5–15)
BUN: 21 mg/dL (ref 6–23)
CO2: 26 mEq/L (ref 19–32)
Calcium: 9.9 mg/dL (ref 8.4–10.5)
Chloride: 101 mEq/L (ref 96–112)
Creatinine, Ser: 0.99 mg/dL (ref 0.50–1.10)
GFR calc Af Amer: 59 mL/min — ABNORMAL LOW (ref >90)
GFR calc non Af Amer: 51 mL/min — ABNORMAL LOW (ref >90)
Glucose, Bld: 110 mg/dL — ABNORMAL HIGH (ref 70–99)
Potassium: 4.4 mEq/L (ref 3.7–5.3)
Sodium: 138 mEq/L (ref 137–147)
Total Bilirubin: 0.2 mg/dL — ABNORMAL LOW (ref 0.3–1.2)
Total Protein: 6.7 g/dL (ref 6.0–8.3)

## 2014-09-02 LAB — BASIC METABOLIC PANEL
Anion gap: 13 (ref 5–15)
BUN: 19 mg/dL (ref 6–23)
CO2: 24 mEq/L (ref 19–32)
Calcium: 10.1 mg/dL (ref 8.4–10.5)
Chloride: 100 mEq/L (ref 96–112)
Creatinine, Ser: 0.94 mg/dL (ref 0.50–1.10)
GFR calc Af Amer: 62 mL/min — ABNORMAL LOW (ref >90)
GFR calc non Af Amer: 54 mL/min — ABNORMAL LOW (ref >90)
Glucose, Bld: 148 mg/dL — ABNORMAL HIGH (ref 70–99)
Potassium: 4.6 mEq/L (ref 3.7–5.3)
Sodium: 137 mEq/L (ref 137–147)

## 2014-09-02 LAB — CBC WITH DIFFERENTIAL/PLATELET
Basophils Absolute: 0 10*3/uL (ref 0.0–0.1)
Basophils Relative: 1 % (ref 0–1)
Eosinophils Absolute: 0.4 10*3/uL (ref 0.0–0.7)
Eosinophils Relative: 5 % (ref 0–5)
HCT: 32.1 % — ABNORMAL LOW (ref 36.0–46.0)
Hemoglobin: 10.5 g/dL — ABNORMAL LOW (ref 12.0–15.0)
Lymphocytes Relative: 24 % (ref 12–46)
Lymphs Abs: 1.9 10*3/uL (ref 0.7–4.0)
MCH: 29 pg (ref 26.0–34.0)
MCHC: 32.7 g/dL (ref 30.0–36.0)
MCV: 88.7 fL (ref 78.0–100.0)
Monocytes Absolute: 0.6 10*3/uL (ref 0.1–1.0)
Monocytes Relative: 8 % (ref 3–12)
Neutro Abs: 5.1 10*3/uL (ref 1.7–7.7)
Neutrophils Relative %: 62 % (ref 43–77)
Platelets: 310 10*3/uL (ref 150–400)
RBC: 3.62 MIL/uL — ABNORMAL LOW (ref 3.87–5.11)
RDW: 13.3 % (ref 11.5–15.5)
WBC: 8 10*3/uL (ref 4.0–10.5)

## 2014-09-02 MED ORDER — AMLODIPINE BESYLATE 10 MG PO TABS
10.0000 mg | ORAL_TABLET | Freq: Every day | ORAL | Status: DC
  Administered 2014-09-02: 10 mg via ORAL
  Filled 2014-09-02: qty 1

## 2014-09-02 MED ORDER — DILTIAZEM HCL ER COATED BEADS 120 MG PO CP24
120.0000 mg | ORAL_CAPSULE | Freq: Every day | ORAL | Status: DC
  Administered 2014-09-02 – 2014-09-06 (×5): 120 mg via ORAL
  Filled 2014-09-02 (×5): qty 1

## 2014-09-02 MED ORDER — PRAVASTATIN SODIUM 80 MG PO TABS
80.0000 mg | ORAL_TABLET | Freq: Every day | ORAL | Status: DC
  Administered 2014-09-02 – 2014-09-05 (×4): 80 mg via ORAL
  Filled 2014-09-02 (×5): qty 1

## 2014-09-02 MED ORDER — HYDRALAZINE HCL 20 MG/ML IJ SOLN: 10.0000 mg | INTRAMUSCULAR | Status: DC | PRN

## 2014-09-02 MED ORDER — EZETIMIBE 10 MG PO TABS
10.0000 mg | ORAL_TABLET | Freq: Every day | ORAL | Status: DC
  Administered 2014-09-02 – 2014-09-05 (×4): 10 mg via ORAL
  Filled 2014-09-02 (×5): qty 1

## 2014-09-02 NOTE — Progress Notes (Signed)
[facility] PROGRESS NOTE  Lashann Hagg JGO:[id number] DOB: Nov 30, 1926 DOA: 09/01/2014 PCP: Rachell Cipro, MD  Assessment/Plan  Recurrent Cellulitis, not worsening but not improved either.  May need additional strep coverage -  IV vanc day 2 -  If not improved tomorrow, will add ceftriaxone -  may need debridement if dusky area in middle worsens -  cont to follow   PAF, noncompliant with rate control medications.  Intolerant of BB, verapamil, and stopped her cardizem a few months ago. - NSR on EKG - Tele:  Sinus arrhythmia, rate controlled, okay to d/c telemetry - Noted hx/o GIB on xarelto 2 months ago-likely not anticoagulation candidate. No GI complaints currently.  - willing to try cardizem 120mg  tab for a week   HTN, elevated BPs in setting of medication noncompliance  -  Willing to try cardizem 120 mg and advised to call her cardiologist's office if she does not like the way it feels -  ARB d/cd during last hospitalization  Aortic stenosis, mild to moderate -ECHO 12/2013 EF 60-65%, mild LVH, mild aortic stenosis   Anemia , hgb decreased slightly today without overt bleeding -noted hx/o GIB assd with anticoagulation in the past    Hx of GIB associated with w/ xarelto -not on anticoagulation  Diet:  Healthy heart Access:  PIV IVF:  off Proph:  heparin  Code Status: full Family Communication: patient and extended family Disposition Plan: pending improvement in cellulitis   Consultants:  None  Procedures:  none  Antibiotics:  vanco 11/3 >>   HPI/Subjective:  Feeling well.  Leg appears unchanged since starting abx.  Objective: Filed Vitals:   09/01/14 1726 09/01/14 1905 09/01/14 2128 09/02/14 0537  BP: 187/85 188/80 195/77 157/63  Pulse: 63 82 79 70  Temp:  98.4 F (36.9 C) 98.5 F (36.9 C) 98.4 F (36.9 C)  TempSrc:  Oral Oral Oral  Resp:  20 20 20   Height:      Weight:      SpO2: 100% 98% 100% 100%    Intake/Output Summary  (Last 24 hours) at 09/02/14 5597 Last data filed at 09/02/14 4163  Gross per 24 hour  Intake    688 ml  Output      0 ml  Net    688 ml   Filed Weights   09/01/14 1425  Weight: 68.04 kg (150 lb)    Exam:   General:  WF, No acute distress  HEENT:  NCAT, MMM  Cardiovascular:  RRR, nl S1, S2 no mrg, 2+ pulses, warm extremities  Respiratory:  CTAB, no increased WOB  Abdomen:   NABS, soft, NT/ND  MSK:   Normal tone and bulk, 1+ bilateral LEE  Neuro:  Grossly intact  Skin:  3cm area on left shin with erythema.  15cm erythematous area on right shin with central linea ulceration with honey crusting and slight duskiness surrounding area without well-demarcated line.  No fluctuance  Data Reviewed: Basic Metabolic Panel:  Recent Labs Lab 08/28/14 1050 09/01/14 1533 09/01/14 2036 09/02/14 0440  NA 139 137  --  138  K 4.3 4.6  --  4.4  CL 102 98  --  101  CO2 22 26  --  26  GLUCOSE 116* 106*  --  110*  BUN 34* 26*  --  21  CREATININE 1.00 1.10 1.06 0.99  CALCIUM 8.9 9.7  --  9.9   Liver Function Tests:  Recent Labs Lab 09/01/14 1533 09/02/14 0440  AST 21 15  ALT  7 6  ALKPHOS 75 69  BILITOT 0.3 0.2*  PROT 7.2 6.7  ALBUMIN 3.0* 2.7*   No results for input(s): LIPASE, AMYLASE in the last 168 hours. No results for input(s): AMMONIA in the last 168 hours. CBC:  Recent Labs Lab 08/28/14 1050 09/01/14 1533 09/01/14 2036 09/02/14 0440  WBC 10.8* 9.8 8.6 8.0  NEUTROABS 7.6 6.6  --  5.1  HGB 10.6* 11.5* 11.5* 10.5*  HCT 32.2* 34.9* 34.9* 32.1*  MCV 90.4 89.7 87.9 88.7  PLT 322 356 370 310   Cardiac Enzymes: No results for input(s): CKTOTAL, CKMB, CKMBINDEX, TROPONINI in the last 168 hours. BNP (last 3 results) No results for input(s): PROBNP in the last 8760 hours. CBG: No results for input(s): GLUCAP in the last 168 hours.  No results found for this or any previous visit (from the past 240 hour(s)).   Studies: No results found.  Scheduled Meds: .  amLODipine  10 mg Oral Daily  . aspirin  81 mg Oral Daily  . calcium carbonate  3 tablet Oral Daily  . ezetimibe-simvastatin  1 tablet Oral QHS  . heparin  5,000 Units Subcutaneous 3 times per day  . levothyroxine  75 mcg Oral Daily  . sodium chloride  3 mL Intravenous Q12H  . vancomycin  750 mg Intravenous Q24H   Continuous Infusions: . sodium chloride 75 mL/hr at 09/01/14 2043    Active Problems:   Cellulitis   Recurrent cellulitis of lower leg    Time spent: 30 min    Tonica Brasington, [facility] Pager [phone]. If 7PM-7AM, please contact night-coverage at [email or web address], password [email or web address] 09/02/2014, 7:22 AM  LOS: 1 day

## 2014-09-02 NOTE — Progress Notes (Signed)
UR completed 

## 2014-09-03 DIAGNOSIS — L03115 Cellulitis of right lower limb: Secondary | ICD-10-CM

## 2014-09-03 MED ORDER — HYDROCHLOROTHIAZIDE 12.5 MG PO CAPS
12.5000 mg | ORAL_CAPSULE | Freq: Every day | ORAL | Status: DC
  Administered 2014-09-03 – 2014-09-06 (×4): 12.5 mg via ORAL
  Filled 2014-09-03 (×5): qty 1

## 2014-09-03 MED ORDER — LOSARTAN POTASSIUM 50 MG PO TABS
100.0000 mg | ORAL_TABLET | Freq: Every day | ORAL | Status: DC
  Administered 2014-09-03 – 2014-09-06 (×4): 100 mg via ORAL
  Filled 2014-09-03 (×4): qty 2

## 2014-09-03 MED ORDER — DEXTROSE 5 % IV SOLN
1.0000 g | INTRAVENOUS | Status: DC
  Administered 2014-09-03: 1 g via INTRAVENOUS
  Filled 2014-09-03 (×2): qty 10

## 2014-09-03 NOTE — Progress Notes (Signed)
[facility] PROGRESS NOTE  [name] HEN:[id number] DOB: 03-17-1927 DOA: 09/01/2014 PCP: Rachell Cipro, MD  Assessment/Plan  Recurrent Cellulitis, still not improved and a new area of erythema has arisen from left ankle.   -  IV vanc day 3 -  Add ceftriaxone for strep coverage -  dusky area enlarging slightly, but still not well-demarcated -  cont to follow and consider surgery consultation if necrosis develops -  F/u wound culture -  May need biopsy if not improving with antibiotics  PAF, noncompliant with rate control medications.  Intolerant of BB, verapamil, and stopped her cardizem a few months ago.  NSR on EKG.  Tele:  Sinus arrhythmia, rate controlled, d/c'd telemetry on 11/4 - Had GIB on xarelto 2 months ago so currently not anticoagulation candidate  - Trial of cardizem 120mg  daily for now -  Advised her to call cardiology if she is not tolerating this dose of cardizem  HTN, elevated BPs despite resuming cardizem -  Resume ARB and HCTZ -  Continue cardizem 120 mg daily  Aortic stenosis, mild to moderate -ECHO 12/2013 EF 60-65%, mild LVH, mild aortic stenosis   Anemia , hgb decreased slightly today without overt bleeding -  Monitor for GIB -  Repeat CBC in 1 week or sooner if needed   Hx of GIB associated with w/ xarelto -not on anticoagulation  Diet:  Healthy heart Access:  PIV IVF:  off Proph:  heparin  Code Status: full Family Communication: patient alone Disposition Plan: pending improvement in cellulitis   Consultants:  None  Procedures:  none  Antibiotics:  vanco 11/3 >>   Ceftriaxone 11/5 >>  HPI/Subjective:  Feeling well but depressed because leg prohibits her from attending church.  Leg appears unchanged since starting abx.  Objective: Filed Vitals:   09/02/14 2108 09/03/14 0529 09/03/14 0717 09/03/14 0945  BP: 142/59 146/74 133/54 148/62  Pulse: 69 60 59 69  Temp: 99 F (37.2 C) 98.1 F (36.7 C) 97.9 F (36.6 C)  98.2 F (36.8 C)  TempSrc: Oral Oral Oral Oral  Resp: 20 18 18 20   Height:      Weight:      SpO2: 98% 98% 98% 100%    Intake/Output Summary (Last 24 hours) at 09/03/14 1217 Last data filed at 09/03/14 1047  Gross per 24 hour  Intake    633 ml  Output      0 ml  Net    633 ml   Filed Weights   09/01/14 1425  Weight: 68.04 kg (150 lb)    Exam:   General:  WF, No acute distress  HEENT:  NCAT, MMM  Cardiovascular:  RRR, nl S1, S2 no mrg, 2+ pulses, warm extremities  Respiratory:  CTAB, no increased WOB  Abdomen:   NABS, soft, NT/ND  MSK:   Normal tone and bulk, 1+ bilateral LEE  Neuro:  Grossly intact  Skin:  3cm area on left shin with erythema.  New 2cm area of erythema on medial left ankle.  15cm erythematous area on right shin with central linea ulceration with honey crusting and slight duskiness surrounding area without well-demarcated line.  No fluctuance.    Data Reviewed: Basic Metabolic Panel:  Recent Labs Lab 08/28/14 1050 09/01/14 1533 09/01/14 2036 09/02/14 0440 09/02/14 1330  NA 139 137  --  138 137  K 4.3 4.6  --  4.4 4.6  CL 102 98  --  101 100  CO2 22 26  --  26 24  GLUCOSE 116* 106*  --  110* 148*  BUN 34* 26*  --  21 19  CREATININE 1.00 1.10 1.06 0.99 0.94  CALCIUM 8.9 9.7  --  9.9 10.1   Liver Function Tests:  Recent Labs Lab 09/01/14 1533 09/02/14 0440  AST 21 15  ALT 7 6  ALKPHOS 75 69  BILITOT 0.3 0.2*  PROT 7.2 6.7  ALBUMIN 3.0* 2.7*   No results for input(s): LIPASE, AMYLASE in the last 168 hours. No results for input(s): AMMONIA in the last 168 hours. CBC:  Recent Labs Lab 08/28/14 1050 09/01/14 1533 09/01/14 2036 09/02/14 0440  WBC 10.8* 9.8 8.6 8.0  NEUTROABS 7.6 6.6  --  5.1  HGB 10.6* 11.5* 11.5* 10.5*  HCT 32.2* 34.9* 34.9* 32.1*  MCV 90.4 89.7 87.9 88.7  PLT 322 356 370 310   Cardiac Enzymes: No results for input(s): CKTOTAL, CKMB, CKMBINDEX, TROPONINI in the last 168 hours. BNP (last 3 results) No  results for input(s): PROBNP in the last 8760 hours. CBG: No results for input(s): GLUCAP in the last 168 hours.  Recent Results (from the past 240 hour(s))  Wound culture     Status: None (Preliminary result)   Collection Time: 09/02/14  5:50 AM  Result Value Ref Range Status   Specimen Description LEG RIGHT  Final   Special Requests NONE  Final   Gram Stain   Final    NO WBC SEEN NO SQUAMOUS EPITHELIAL CELLS SEEN NO ORGANISMS SEEN Performed at [facility]    Culture   Final    Culture reincubated for better growth Performed at [facility]    Report Status PENDING  Incomplete     Studies: No results found.  Scheduled Meds: . aspirin  81 mg Oral Daily  . calcium carbonate  3 tablet Oral Daily  . diltiazem  120 mg Oral Daily  . ezetimibe  10 mg Oral Daily  . heparin  5,000 Units Subcutaneous 3 times per day  . hydrochlorothiazide  12.5 mg Oral Daily  . levothyroxine  75 mcg Oral Daily  . losartan  100 mg Oral Daily  . pravastatin  80 mg Oral q1800  . sodium chloride  3 mL Intravenous Q12H  . vancomycin  750 mg Intravenous Q24H   Continuous Infusions:    Active Problems:   HTN (hypertension)   Cellulitis   Atrial fibrillation   Recurrent cellulitis of lower leg   Essential hypertension    Time spent: 30 min    Isaiha Asare, [place] [facility] Pager [phone]. If 7PM-7AM, please contact night-coverage at [email or web address], password [facility] 09/03/2014, 12:17 PM  LOS: 2 days

## 2014-09-03 NOTE — Progress Notes (Signed)
ANTIBIOTIC CONSULT NOTE   Pharmacy Consult for ceftriaxone / vancomycin Indication: recurrent cellulitis LE  Allergies  Allergen Reactions  . Aspirin Nausea And Vomiting    Low Dose is ok  . Codeine Nausea And Vomiting    "Deathly Sick"  . Coreg [Carvedilol] Other (See Comments)    Fatigue/ ill  . Penicillins Rash    Patient Measurements: Height: 5' (152.4 cm) Weight: 150 lb (68.04 kg) IBW/kg (Calculated) : 45.5   Vital Signs: Temp: 98.2 F (36.8 C) (11/05 0945) Temp Source: Oral (11/05 0945) BP: 148/62 mmHg (11/05 0945) Pulse Rate: 69 (11/05 0945) Intake/Output from previous day: 11/04 0701 - 11/05 0700 In: 600 [P.O.:600] Out: -  Intake/Output from this shift: Total I/O In: 153 [P.O.:150; I.V.:3] Out: -   Labs:  Recent Labs  09/01/14 1533 09/01/14 2036 09/02/14 0440 09/02/14 1330  WBC 9.8 8.6 8.0  --   HGB 11.5* 11.5* 10.5*  --   PLT 356 370 310  --   CREATININE 1.10 1.06 0.99 0.94   Estimated Creatinine Clearance: 37.6 mL/min (by C-G formula based on Cr of 0.94). No results for input(s): VANCOTROUGH, VANCOPEAK, VANCORANDOM, GENTTROUGH, GENTPEAK, GENTRANDOM, TOBRATROUGH, TOBRAPEAK, TOBRARND, AMIKACINPEAK, AMIKACINTROU, AMIKACIN in the last 72 hours.   Microbiology: Recent Results (from the past 720 hour(s))  Wound culture     Status: None (Preliminary result)   Collection Time: 09/02/14  5:50 AM  Result Value Ref Range Status   Specimen Description LEG RIGHT  Final   Special Requests NONE  Final   Gram Stain   Final    NO WBC SEEN NO SQUAMOUS EPITHELIAL CELLS SEEN NO ORGANISMS SEEN Performed at [facility]    Culture   Final    Culture reincubated for better growth Performed at [facility]    Report Status PENDING  Incomplete    Medical History: Past Medical History  Diagnosis Date  . HTN (hypertension)   . HLD (hyperlipidemia)   . Hypothyroidism   . Aortic stenosis, moderate     last echo in 2010; AVA 1.1; mild AS per  echo in June 2013  . Edema of both legs     s/p laser treatment per Dr. Donnetta Hutching  . A-fib     Medications:  Scheduled:  . aspirin  81 mg Oral Daily  . calcium carbonate  3 tablet Oral Daily  . cefTRIAXone (ROCEPHIN)  IV  1 g Intravenous Q24H  . diltiazem  120 mg Oral Daily  . ezetimibe  10 mg Oral Daily  . heparin  5,000 Units Subcutaneous 3 times per day  . hydrochlorothiazide  12.5 mg Oral Daily  . levothyroxine  75 mcg Oral Daily  . losartan  100 mg Oral Daily  . pravastatin  80 mg Oral q1800  . sodium chloride  3 mL Intravenous Q12H  . vancomycin  750 mg Intravenous Q24H   Infusions:   PRN: acetaminophen, dicyclomine, hydrALAZINE   Assessment: 80 y/o F admitted with recurrent LE cellulitis that was unresponsive to outpatient oral clindamycin, now on D#3 vancomycin (1g x 1, then 750mg  q24h) without improvement.  New orders received to add ceftriaxone.  Goal of Therapy:  Appropriate antibiotic dosing for renal function; eradication of infection. Vancomycin trough 10-15  Plan:  1. Ceftriaxone 1 gram IV q24h 2. Continue vancomycin 750 mg IV q24h 3. Serum creatinine tomorrow AM.   Clayburn Pert, PharmD, BCPS Pager: [phone] 09/03/2014  12:35 PM

## 2014-09-04 ENCOUNTER — Encounter (HOSPITAL_COMMUNITY): Payer: Self-pay | Admitting: General Surgery

## 2014-09-04 LAB — BASIC METABOLIC PANEL
Anion gap: 16 — ABNORMAL HIGH (ref 5–15)
BUN: 18 mg/dL (ref 6–23)
CO2: 24 mEq/L (ref 19–32)
Calcium: 9.5 mg/dL (ref 8.4–10.5)
Chloride: 98 mEq/L (ref 96–112)
Creatinine, Ser: 1.09 mg/dL (ref 0.50–1.10)
GFR calc Af Amer: 52 mL/min — ABNORMAL LOW (ref >90)
GFR calc non Af Amer: 45 mL/min — ABNORMAL LOW (ref >90)
Glucose, Bld: 97 mg/dL (ref 70–99)
Potassium: 4.7 mEq/L (ref 3.7–5.3)
Sodium: 138 mEq/L (ref 137–147)

## 2014-09-04 LAB — WOUND CULTURE: Gram Stain: NONE SEEN

## 2014-09-04 LAB — CREATININE, SERUM
Creatinine, Ser: 1.11 mg/dL — ABNORMAL HIGH (ref 0.50–1.10)
GFR calc Af Amer: 51 mL/min — ABNORMAL LOW (ref >90)
GFR calc non Af Amer: 44 mL/min — ABNORMAL LOW (ref >90)

## 2014-09-04 MED ORDER — LIDOCAINE-EPINEPHRINE 2 %-1:100000 IJ SOLN
1.7000 mL | Freq: Once | INTRAMUSCULAR | Status: DC
  Filled 2014-09-04: qty 1.7

## 2014-09-04 NOTE — Progress Notes (Signed)
[facility] PROGRESS NOTE  [name] WCB:[id number] DOB: 1927-06-21 DOA: 09/01/2014 PCP: Rachell Cipro, MD  Brief Summary  History of Present Illness:This is a 80 y.o. year old female with significant past medical history of recurrent LE cellulitis, venous insufficiency, moderate aortic stenosis, atrial fibrillation-now NSR not on anticoagulation presenting with recurrentl LE cellulitis. Pt reports being admitted in the past (03/2014) for recurrent cellulitis. Had failed outpt clindamycin at the time. Required IV vancomycin and was discharged on extended course of doxy. Cellulitis involved LLE at the time. Pt developed RLE red nodule with cellulitis about a month prior to admission that did not get better with doxycycline or clindamycin.  Denied fevers.  Non diabetic. Non smoker. No recent trauma to affected area. States that she was initially seen on 08/17/2014 for sxs. Was placed on outpt doxy. Sxs minimally improved. Was seen in ER on 10/30 for follow up. Placed on clindamycin with worsening of sxs.   Assessment/Plan  Recurrent Cellulitis, no improvement with vancomycin and minimal improvement after starting ceftriaxone.   Given chronicity, multiple affected areas, wonder if this is infection at all.   -  Discussed case with Dr. Linus Salmons from infectious disease -  D/c vancomycin and ceftriaxone -  General Surgery consulted for possible skin biopsy -  dusky area enlarging slightly, but still not well-demarcated -  Wound culture negative for Staph or Strep  PAF, noncompliant with rate control medications.  Intolerant of BB, verapamil, and stopped her cardizem a few months ago.  NSR on EKG.  Tele:  Sinus arrhythmia, rate controlled, d/c'd telemetry on 11/4 -  Had GIB on xarelto 2 months ago so currently not anticoagulation candidate  -  Trial of cardizem 120mg  daily for now -  Advised her to call cardiology if she is not tolerating this dose of cardizem  HTN, BPs improving after  resuming some home medications -  Continue ARB and HCTZ -  Continue cardizem 120 mg daily  Aortic stenosis, mild to moderate -ECHO 12/2013 EF 60-65%, mild LVH, mild aortic stenosis   Anemia, repeat CBC on Monday or by PCP  Hx of GIB associated with w/ xarelto -not on anticoagulation  Diet:  Healthy heart Access:  PIV IVF:  off Proph:  heparin  Code Status: full Family Communication: patient alone Disposition Plan:  Not considerably improved after antibiotics.  Biopsy for diagnosis    Consultants:  General surgery  Procedures:  none  Antibiotics:  vanco 11/3 >> 11/6  Ceftriaxone 11/5 >> 11/6  HPI/Subjective:  Leg may be slightly less red today compared to yesterday.    Objective: Filed Vitals:   09/03/14 1300 09/03/14 2245 09/04/14 0511 09/04/14 0733  BP: 126/51 172/85 147/64 142/64  Pulse: 60 62 61 63  Temp: 98.2 F (36.8 C) 98.8 F (37.1 C) 97.5 F (36.4 C) 98.2 F (36.8 C)  TempSrc: Oral Oral Oral Oral  Resp: 20 20 20 18   Height:      Weight:      SpO2: 100% 100% 100% 98%    Intake/Output Summary (Last 24 hours) at 09/04/14 1004 Last data filed at 09/03/14 1351  Gross per 24 hour  Intake    443 ml  Output      0 ml  Net    443 ml   Filed Weights   09/01/14 1425  Weight: 68.04 kg (150 lb)    Exam:   General:  WF, No acute distress  HEENT:  NCAT, MMM  Cardiovascular:  RRR, nl S1, S2  no mrg, 2+ pulses, warm extremities  Respiratory:  CTAB, no increased WOB  Abdomen:   NABS, soft, NT/ND  MSK:   Normal tone and bulk, 1+ bilateral LEE  Neuro:  Grossly intact  Skin:  3 cm area on left shin with erythema.  2cm area of erythema on medial left ankle.  15cm erythematous area on right shin with central linea ulceration with honey crusting and slight duskiness surrounding area without well-demarcated line.  No fluctuance.    Data Reviewed: Basic Metabolic Panel:  Recent Labs Lab 08/28/14 1050 09/01/14 1533 09/01/14 2036 09/02/14 0440  09/02/14 1330 09/04/14 0527  NA 139 137  --  138 137  --   K 4.3 4.6  --  4.4 4.6  --   CL 102 98  --  101 100  --   CO2 22 26  --  26 24  --   GLUCOSE 116* 106*  --  110* 148*  --   BUN 34* 26*  --  21 19  --   CREATININE 1.00 1.10 1.06 0.99 0.94 1.11*  CALCIUM 8.9 9.7  --  9.9 10.1  --    Liver Function Tests:  Recent Labs Lab 09/01/14 1533 09/02/14 0440  AST 21 15  ALT 7 6  ALKPHOS 75 69  BILITOT 0.3 0.2*  PROT 7.2 6.7  ALBUMIN 3.0* 2.7*   No results for input(s): LIPASE, AMYLASE in the last 168 hours. No results for input(s): AMMONIA in the last 168 hours. CBC:  Recent Labs Lab 08/28/14 1050 09/01/14 1533 09/01/14 2036 09/02/14 0440  WBC 10.8* 9.8 8.6 8.0  NEUTROABS 7.6 6.6  --  5.1  HGB 10.6* 11.5* 11.5* 10.5*  HCT 32.2* 34.9* 34.9* 32.1*  MCV 90.4 89.7 87.9 88.7  PLT 322 356 370 310   Cardiac Enzymes: No results for input(s): CKTOTAL, CKMB, CKMBINDEX, TROPONINI in the last 168 hours. BNP (last 3 results) No results for input(s): PROBNP in the last 8760 hours. CBG: No results for input(s): GLUCAP in the last 168 hours.  Recent Results (from the past 240 hour(s))  Wound culture     Status: None   Collection Time: 09/02/14  5:50 AM  Result Value Ref Range Status   Specimen Description LEG RIGHT  Final   Special Requests NONE  Final   Gram Stain   Final    NO WBC SEEN NO SQUAMOUS EPITHELIAL CELLS SEEN NO ORGANISMS SEEN Performed at [facility]    Culture   Final    MULTIPLE ORGANISMS PRESENT, NONE PREDOMINANT Note: NO STAPHYLOCOCCUS AUREUS ISOLATED NO GROUP A STREP (S.PYOGENES) ISOLATED Performed at [facility]    Report Status 09/04/2014 FINAL  Final     Studies: No results found.  Scheduled Meds: . aspirin  81 mg Oral Daily  . calcium carbonate  3 tablet Oral Daily  . cefTRIAXone (ROCEPHIN)  IV  1 g Intravenous Q24H  . diltiazem  120 mg Oral Daily  . ezetimibe  10 mg Oral Daily  . heparin  5,000 Units Subcutaneous 3  times per day  . hydrochlorothiazide  12.5 mg Oral Daily  . levothyroxine  75 mcg Oral Daily  . losartan  100 mg Oral Daily  . pravastatin  80 mg Oral q1800  . sodium chloride  3 mL Intravenous Q12H  . vancomycin  750 mg Intravenous Q24H   Continuous Infusions:    Active Problems:   HTN (hypertension)   Cellulitis   Atrial fibrillation  Recurrent cellulitis of lower leg   Essential hypertension    Time spent: 30 min    Neomia Herbel, [place] [facility] Pager [phone]. If 7PM-7AM, please contact night-coverage at [email or web address], password [facility] 09/04/2014, 10:04 AM  LOS: 3 days

## 2014-09-04 NOTE — Progress Notes (Signed)
Co signed this in error.

## 2014-09-04 NOTE — Consult Note (Signed)
[facility] Surgery Consult Note  Daun Rens [birth year]  [id number].    Requesting MD: Dr. Sheran Fava Chief Complaint/Reason for Consult: Cellulitis of b/l lower extremities  HPI:  80 y.o. year old white female with PMH unresolving b/l lower extremity cellulitis, venous insufficiency, moderate aortic stenosis, atrial fibrillation-now NSR (not on anticoagulation)  presenting to [place] due to unresolving LE cellulitis Pt reports being admitted in the past (03/2014) for recurrent cellulitis. Had failed outpt clindamycin at the time. Required IV vancomycin and was discharged on extended course of Doxy. Cellulitis involved LLE at the time. The LLE has shown improvement, but now has progressive RLE (anteriomedial mid shin) redness and pain over past 2-3 days. Denies any fevers or chills. Non diabetic. Non smoker. No recent trauma to affected area. States that she was initially seen on 08/17/2014 for sxs. Was placed on outpt Doxy without improvement.  Was seen in ER on 10/30 for follow up.  Placed on clindamycin with worsening of sxs, since then she says the new medication made her right leg cellulitis worse.  Of note the patient states she had b/l endovenous ablation of her saphenous vein 2 years ago by Dr. Donnetta Hutching.  Consult asking [name] to consider a punch biopsy of the lesion because Dr. Sheran Fava suspects vasculitis.     ROS: All systems reviewed and otherwise negative except for as above  Family History  Problem Relation Age of Onset  . Peripheral vascular disease    . Heart disease Mother   . Peripheral vascular disease Mother   . Heart disease Father   . Peripheral vascular disease Father     Right leg amputation  . Hypertension Sister   . Heart disease Brother 16    Heart Disease before age 49    Past Medical History  Diagnosis Date  . HTN (hypertension)   . HLD (hyperlipidemia)   . Hypothyroidism   . Aortic stenosis, moderate     last echo in 2010; AVA 1.1; mild AS per echo in June  2013  . Edema of both legs     s/p laser treatment per Dr. Donnetta Hutching  . A-fib     Past Surgical History  Procedure Laterality Date  . Appendectomy  1955  . Foot surgery  2003    bilateral Hammer Toe  . Eye surgery  2005    Bilateral cataract  . Endovenous ablation saphenous vein w/ laser  08-29-2012    left greater saphenous vein   Curt Jews MD  . Endovenous ablation saphenous vein w/ laser  09-19-2012    right greater saphenous vein by Curt Jews MD  . Stab phlebectomy Right 01-02-2013    10-15 incisions right thigh and calf by Curt Jews MD    Social History:  reports that she quit smoking about 30 years ago. Her smoking use included Cigarettes. She smoked 0.00 packs per day. She has never used smokeless tobacco. She reports that she does not drink alcohol or use illicit drugs.  Allergies:  Allergies  Allergen Reactions  . Aspirin Nausea And Vomiting    Low Dose is ok  . Codeine Nausea And Vomiting    "Deathly Sick"  . Coreg [Carvedilol] Other (See Comments)    Fatigue/ ill  . Penicillins Rash    Medications Prior to Admission  Medication Sig Dispense Refill  . acetaminophen (TYLENOL) 500 MG tablet Take 1,000 mg by mouth every 6 (six) hours as needed (pain).     Marland Kitchen aspirin 81 MG chewable tablet  Chew 81 mg by mouth daily.    . calcium carbonate (OS-CAL) 600 MG TABS Take 1,800 mg by mouth daily.    . clindamycin (CLEOCIN) 300 MG capsule Take 1 capsule (300 mg total) by mouth 4 (four) times daily. X 7 days (Patient taking differently: Take 300 mg by mouth 4 (four) times daily. 7 day therapy course patient began on 08/28/2014 and completed on 11.3.2015) 28 capsule 0  . cyanocobalamin 500 MCG tablet Take 500 mcg by mouth daily.    . diclofenac sodium (VOLTAREN) 1 % GEL Apply 2 g topically 4 (four) times daily as needed (For pain).    Marland Kitchen dicyclomine (BENTYL) 20 MG tablet Take 10 mg by mouth every 6 (six) hours as needed for spasms. For stomach irritability.    Marland Kitchen diltiazem (CARDIZEM  CD) 180 MG 24 hr capsule Take 1 capsule (180 mg total) by mouth daily. 31 capsule 11  . estrogens, conjugated, (PREMARIN) 0.625 MG tablet Take 0.625 mg by mouth daily.     Marland Kitchen ezetimibe-simvastatin (VYTORIN) 10-40 MG per tablet Take 1 tablet by mouth at bedtime.    Marland Kitchen levothyroxine (SYNTHROID, LEVOTHROID) 75 MCG tablet Take 75 mcg by mouth daily.      . Losartan Potassium-HCTZ (HYZAAR PO) Take by mouth.    . losartan-hydrochlorothiazide (HYZAAR) 100-12.5 MG per tablet Take 1 tablet by mouth daily.    . medroxyPROGESTERone (PROVERA) 2.5 MG tablet Take 5 mg by mouth as directed. For 10 days each month 20th-30th    . Multiple Vitamins-Minerals (PRESERVISION AREDS 2) CAPS Take 1 capsule by mouth 2 (two) times daily.    . mupirocin cream (BACTROBAN) 2 % Apply 1 application topically 2 (two) times daily. 15 g 0  . olopatadine (PATANOL) 0.1 % ophthalmic solution Place 1 drop into both eyes 2 (two) times daily.    . Omega-3 Fatty Acids (FISH OIL) 1000 MG CAPS Take 1 capsule by mouth daily.       Blood pressure 160/56, pulse 64, temperature 98.2 F (36.8 C), temperature source Oral, resp. rate 18, height 5' (1.524 m), weight 150 lb (68.04 kg), SpO2 98 %. Physical Exam: General: pleasant, WD/WN white female who is laying in bed in NAD HEENT: head is normocephalic, atraumatic.  Sclera are noninjected.  PERRL.  Ears and nose without any masses or lesions.  Mouth is pink and moist Heart: regular, rate, and rhythm.  No obvious murmurs, gallops, or rubs noted.  Palpable pedal pulses bilaterally Lungs: CTAB, no wheezes, rhonchi, or rales noted.  Respiratory effort nonlabored Abd: soft, NT/ND, +BS, no masses, hernias, or organomegaly MS: all 4 extremities are symmetrical with no cyanosis, clubbing, or edema. Skin: warm and dry.  RLE with 4cm x 3cm violaceous bolus lesion with surrounding cellulitis as pictured below.  The central area has eruptive ulcerations which are draining serous fluid.  The left LE has 2  smaller lesions - one left lateral ankle, and the other proximal lesion is over the anterior shin.  These 2 lesions appear to be resolving compared to the right. Psych: A&Ox3 with an appropriate affect.   Right LE 09/04/14  Left LE 09/04/14    Results for orders placed or performed during the hospital encounter of 09/01/14 (from the past 48 hour(s))  Basic metabolic panel     Status: Abnormal   Collection Time: 09/02/14  1:30 PM  Result Value Ref Range   Sodium 137 137 - 147 mEq/L   Potassium 4.6 3.7 - 5.3 mEq/L   Chloride  100 96 - 112 mEq/L   CO2 24 19 - 32 mEq/L   Glucose, Bld 148 (H) 70 - 99 mg/dL   BUN 19 6 - 23 mg/dL   Creatinine, Ser 0.94 0.50 - 1.10 mg/dL   Calcium 10.1 8.4 - 10.5 mg/dL   GFR calc non Af Amer 54 (L) >90 mL/min   GFR calc Af Amer 62 (L) >90 mL/min    Comment: (NOTE) The eGFR has been calculated using the CKD EPI equation. This calculation has not been validated in all clinical situations. eGFR's persistently <90 mL/min signify possible Chronic Kidney Disease.    Anion gap 13 5 - 15  Creatinine, serum     Status: Abnormal   Collection Time: 09/04/14  5:27 AM  Result Value Ref Range   Creatinine, Ser 1.11 (H) 0.50 - 1.10 mg/dL   GFR calc non Af Amer 44 (L) >90 mL/min   GFR calc Af Amer 51 (L) >90 mL/min    Comment: (NOTE) The eGFR has been calculated using the CKD EPI equation. This calculation has not been validated in all clinical situations. eGFR's persistently <90 mL/min signify possible Chronic Kidney Disease.   Basic metabolic panel     Status: Abnormal   Collection Time: 09/04/14  5:40 AM  Result Value Ref Range   Sodium 138 137 - 147 mEq/L   Potassium 4.7 3.7 - 5.3 mEq/L   Chloride 98 96 - 112 mEq/L   CO2 24 19 - 32 mEq/L   Glucose, Bld 97 70 - 99 mg/dL   BUN 18 6 - 23 mg/dL   Creatinine, Ser 1.09 0.50 - 1.10 mg/dL   Calcium 9.5 8.4 - 10.5 mg/dL   GFR calc non Af Amer 45 (L) >90 mL/min   GFR calc Af Amer 52 (L) >90 mL/min    Comment:  (NOTE) The eGFR has been calculated using the CKD EPI equation. This calculation has not been validated in all clinical situations. eGFR's persistently <90 mL/min signify possible Chronic Kidney Disease.    Anion gap 16 (H) 5 - 15   No results found.    Assessment/Plan Recurrent cellulitis bilateral lower extremity ?Vasculitis  Plan: 1.  Has failed antibiotic therapy.  Needs punch biopsy.  She is on aspirin and SQ heparin and at least the heparin will need to be held prior to the procedure so she does not bleed significantly 2.  After discussing with Dr. Marcello Moores, she recommends we proceed.  We will attempt to get her procedure done today, if not will have on call surgeon complete the biopsy tomorrow, but if emergencies come in she will get delayed since this is normally an outpatient procedure. 3.  Vasculitis workup pending.   Coralie Keens, [facility] Surgery 09/04/2014, 12:32 PM Pager: [phone]

## 2014-09-04 NOTE — Procedures (Signed)
Incision and Drainage Procedure Note  Pre-operative Diagnosis: cellulitis of lower extremities  Post-operative Diagnosis: same  Indications: this is an 80 yo white female who has a cellulitis of her lower extremities that is not improving.  A biopsy is requested to rule out vaculitis  Anesthesia: 2% lidocaine with epinephrine  Procedure Details  The procedure, risks and complications have been discussed in detail (including, but not limited to infection, bleeding) with the patient, and the patient has given verbal consent to the procedure.  The skin was sterilely prepped and draped over the affected area in the usual fashion. After adequate local anesthesia, 2 mm punch biopsy was used to obtain a specimen from her right lower extremity. The patient was observed until stable.  Findings: none  EBL: 2 cc's  Drains: none  Condition: Tolerated procedure well and Stable   Complications: none.  Eriverto Byrnes E 4:31 PM 09/04/2014

## 2014-09-05 LAB — HIV ANTIBODY (ROUTINE TESTING W REFLEX): HIV 1&2 Ab, 4th Generation: NONREACTIVE

## 2014-09-05 LAB — SEDIMENTATION RATE: Sed Rate: 79 mm/hr — ABNORMAL HIGH (ref 0–22)

## 2014-09-05 NOTE — Progress Notes (Signed)
[facility] PROGRESS NOTE  [name] UVJ:[id number] DOB: [birth year] DOA: 09/01/2014 PCP: Rachell Cipro, MD  Brief Summary  History of Present Illness:This is a 80 y.o. year old female with significant past medical history of recurrent LE cellulitis, venous insufficiency, moderate aortic stenosis, atrial fibrillation-now NSR not on anticoagulation presenting with recurrentl LE cellulitis. Pt reports being admitted in the past (03/2014) for recurrent cellulitis. Had failed outpt clindamycin at the time. Required IV vancomycin and was discharged on extended course of doxy. Cellulitis involved LLE at the time. Pt developed RLE red nodule with cellulitis about a month prior to admission that did not get better with doxycycline or clindamycin.  Denied fevers.  Non diabetic. Non smoker. No recent trauma to affected area. States that she was initially seen on 08/17/2014 for sxs. Was placed on outpt doxy. Sxs minimally improved. Was seen in ER on 10/30 for follow up. Placed on clindamycin with worsening of sxs.   Assessment/Plan  Recurrent Cellulitis, no improvement with vancomycin and minimal improvement after starting ceftriaxone.   Given chronicity, multiple affected areas, wonder if this is infection at all.   -  Discussed case with Dr. Linus Salmons from infectious disease -  D/c'd vancomycin and ceftriaxone on 11/6 -  Appreciate General Surgery assistance:  Skin biopsy obtained on 11/6 -  Vasculitis labs sent this morning -  Wound culture negative for Staph or Strep -  Monitor for signs of worsening infection off of antibiotics and await ESR to determine if she will need ongoing antibiotics and steroids as outpatient  PAF, noncompliant with rate control medications.  Intolerant of BB, verapamil, and stopped her cardizem a few months ago.  NSR on EKG.  Tele:  Sinus arrhythmia, rate controlled, d/c'd telemetry on 11/4 -  Had GIB on xarelto 2 months ago so currently not anticoagulation  candidate  -  Tolerating cardizem 16m daily -  Advised her to call cardiology if she is not tolerating this dose of cardizem  HTN, BPs improving after resuming some home medications -  Continue ARB and HCTZ  -  Continue cardizem 120 mg daily  Aortic stenosis, mild to moderate -ECHO 12/2013 EF 60-65%, mild LVH, mild aortic stenosis   Anemia, repeat CBC on Monday or by PCP  Hx of GIB associated with w/ xarelto -not on anticoagulation  Diet:  Healthy heart Access:  PIV IVF:  off Proph:  heparin  Code Status: full Family Communication: patient alone Disposition Plan:   Monitor for signs of worsening infection off of antibiotics and await ESR to determine if she will need ongoing antibiotics and steroids as outpatient    Consultants:  General surgery  Procedures:  Skin biopsy on 11/6 >>  Antibiotics:  vanco 11/3 >> 11/6  Ceftriaxone 11/5 >> 11/6  HPI/Subjective:  Denies fatigue after starting cardizem.  Leg feels about the same, still very TTP  Objective: Filed Vitals:   09/04/14 1013 09/04/14 1354 09/04/14 2136 09/05/14 0533  BP: 160/56 136/59 162/68 145/56  Pulse: 64 60 71 64  Temp: 98.2 F (36.8 C) 98.4 F (36.9 C) 98.1 F (36.7 C) 97.9 F (36.6 C)  TempSrc: Oral Oral Oral Oral  Resp:   18 18  Height:      Weight:      SpO2: 98% 99% 100% 100%    Intake/Output Summary (Last 24 hours) at 09/05/14 0848 Last data filed at 09/04/14 1844  Gross per 24 hour  Intake   1103 ml  Output  0 ml  Net   1103 ml   Filed Weights   09/01/14 1425  Weight: 68.04 kg (150 lb)    Exam:   General:  WF, No acute distress  HEENT:  NCAT, MMM  Cardiovascular:  RRR, nl S1, S2 no mrg, 2+ pulses, warm extremities  Respiratory:  CTAB, no increased WOB  Abdomen:   NABS, soft, NT/ND  MSK:   Normal tone and bulk, 1+ bilateral LEE  Neuro:  Grossly intact  Skin:  3 cm area on left shin with erythema.  2cm area of erythema on medial left ankle.  New pustules  forming on anterior left ankle.  15cm erythematous area on right shin with central linea ulceration with honey crusting and slight duskiness surrounding area without well-demarcated line.  Small amount of bleeding from biopsy site.  No fluctuance.  Very TTP  Data Reviewed: Basic Metabolic Panel:  Recent Labs Lab 09/01/14 1533 09/01/14 2036 09/02/14 0440 09/02/14 1330 09/04/14 0527 09/04/14 0540  NA 137  --  138 137  --  138  K 4.6  --  4.4 4.6  --  4.7  CL 98  --  101 100  --  98  CO2 26  --  26 24  --  24  GLUCOSE 106*  --  110* 148*  --  97  BUN 26*  --  21 19  --  18  CREATININE 1.10 1.06 0.99 0.94 1.11* 1.09  CALCIUM 9.7  --  9.9 10.1  --  9.5   Liver Function Tests:  Recent Labs Lab 09/01/14 1533 09/02/14 0440  AST 21 15  ALT 7 6  ALKPHOS 75 69  BILITOT 0.3 0.2*  PROT 7.2 6.7  ALBUMIN 3.0* 2.7*   No results for input(s): LIPASE, AMYLASE in the last 168 hours. No results for input(s): AMMONIA in the last 168 hours. CBC:  Recent Labs Lab 09/01/14 1533 09/01/14 2036 09/02/14 0440  WBC 9.8 8.6 8.0  NEUTROABS 6.6  --  5.1  HGB 11.5* 11.5* 10.5*  HCT 34.9* 34.9* 32.1*  MCV 89.7 87.9 88.7  PLT 356 370 310   Cardiac Enzymes: No results for input(s): CKTOTAL, CKMB, CKMBINDEX, TROPONINI in the last 168 hours. BNP (last 3 results) No results for input(s): PROBNP in the last 8760 hours. CBG: No results for input(s): GLUCAP in the last 168 hours.  Recent Results (from the past 240 hour(s))  Wound culture     Status: None   Collection Time: 09/02/14  5:50 AM  Result Value Ref Range Status   Specimen Description LEG RIGHT  Final   Special Requests NONE  Final   Gram Stain   Final    NO WBC SEEN NO SQUAMOUS EPITHELIAL CELLS SEEN NO ORGANISMS SEEN Performed at [facility]    Culture   Final    MULTIPLE ORGANISMS PRESENT, NONE PREDOMINANT Note: NO STAPHYLOCOCCUS AUREUS ISOLATED NO GROUP A STREP (S.PYOGENES) ISOLATED Performed at [facility]    Report Status 09/04/2014 FINAL  Final     Studies: No results found.  Scheduled Meds: . aspirin  81 mg Oral Daily  . calcium carbonate  3 tablet Oral Daily  . diltiazem  120 mg Oral Daily  . ezetimibe  10 mg Oral Daily  . heparin  5,000 Units Subcutaneous 3 times per day  . hydrochlorothiazide  12.5 mg Oral Daily  . levothyroxine  75 mcg Oral Daily  . lidocaine-EPINEPHrine  1.7 mL Intradermal Once  . losartan  100 mg Oral Daily  . pravastatin  80 mg Oral q1800  . sodium chloride  3 mL Intravenous Q12H   Continuous Infusions:    Active Problems:   HTN (hypertension)   Cellulitis   Atrial fibrillation   Recurrent cellulitis of lower leg   Essential hypertension    Time spent: 30 min    [name], [facility] Pager [phone]. If 7PM-7AM, please contact night-coverage at [email or web address], password [facility] 09/05/2014, 8:48 AM  LOS: 4 days

## 2014-09-05 NOTE — Progress Notes (Signed)
Patient ID: [name], female   DOB: 12-Jul-1927, 80 y.o.   MRN: [id number]    Subjective: Patient reports no problems from biopsy site  Objective: Vital signs in last 24 hours: Temp:  [97.9 F (36.6 C)-98.4 F (36.9 C)] 97.9 F (36.6 C) (11/07 0533) Pulse Rate:  [60-71] 64 (11/07 0533) Resp:  [18] 18 (11/07 0533) BP: (136-162)/(56-68) 145/56 mmHg (11/07 0533) SpO2:  [99 %-100 %] 100 % (11/07 0533) Last BM Date: 09/03/14  Intake/Output from previous day: 11/06 0701 - 11/07 0700 In: 1283 [P.O.:1280; I.V.:3] Out: -  Intake/Output this shift:    Incision/Wound:No bleeding or infection  Lab Results:  No results for input(s): WBC, HGB, HCT, PLT in the last 72 hours. BMET  Recent Labs  09/02/14 1330 09/04/14 0527 09/04/14 0540  NA 137  --  138  K 4.6  --  4.7  CL 100  --  98  CO2 24  --  24  GLUCOSE 148*  --  97  BUN 19  --  18  CREATININE 0.94 1.11* 1.09  CALCIUM 10.1  --  9.5     Studies/Results: No results found.  Anti-infectives: Anti-infectives    Start     Dose/Rate Route Frequency Ordered Stop   09/03/14 1400  cefTRIAXone (ROCEPHIN) 1 g in dextrose 5 % 50 mL IVPB  Status:  Discontinued     1 g100 mL/hr over 30 Minutes Intravenous Every 24 hours 09/03/14 1228 09/04/14 1010   09/02/14 1600  vancomycin (VANCOCIN) IVPB 750 mg/150 ml premix  Status:  Discontinued     750 mg150 mL/hr over 60 Minutes Intravenous Every 24 hours 09/01/14 2022 09/04/14 1010   09/01/14 1515  vancomycin (VANCOCIN) IVPB 1000 mg/200 mL premix     1,000 mg200 mL/hr over 60 Minutes Intravenous  Once 09/01/14 1512 09/01/14 1652      Assessment/Plan: No problems post skin biopsy. Please call as needed.    LOS: 4 days    Latora Quarry T 09/05/2014

## 2014-09-06 MED ORDER — EZETIMIBE 10 MG PO TABS: 10.0000 mg | ORAL_TABLET | Freq: Every day | ORAL | Status: DC

## 2014-09-06 MED ORDER — DILTIAZEM HCL ER COATED BEADS 120 MG PO CP24: 120.0000 mg | ORAL_CAPSULE | Freq: Every day | ORAL | Status: DC

## 2014-09-06 MED ORDER — PRAVASTATIN SODIUM 80 MG PO TABS: 80.0000 mg | ORAL_TABLET | Freq: Every day | ORAL | Status: DC

## 2014-09-06 NOTE — Progress Notes (Signed)
CARE MANAGEMENT NOTE 09/06/2014  Patient:  [name], [name]   Account Number:  [id number]  Date Initiated:  09/02/2014  Documentation initiated by:  Edwyna Shell  Subjective/Objective Assessment:   80 yo female admitted from home for cellulitis of bilateral lower extremities     Action/Plan:   Patient stated that she has a PCP, pharmacy, drives self to appointments, and has 2 local sons and neighbors for support   Anticipated DC Date:  09/05/2014   Anticipated DC Plan:  [place]  CM consult      Choice offered to / List presented to:             Status of service:  Completed, signed off Medicare Important Message given?  YES (If response is "NO", the following Medicare IM given date fields will be blank) Date Medicare IM given:  09/06/2014 Medicare IM given by:  [facility] Date Additional Medicare IM given:   Additional Medicare IM given by:    Discharge Disposition:  HOME/SELF CARE  Per UR Regulation:  Reviewed for med. necessity/level of care/duration of stay  If discussed at [place] of Stay Meetings, dates discussed:    Comments:  09/06/2014 1230 NCM spoke to pt and no dc needs identified. Jonnie Finner RN CCM Case Mgmt phone [phone]  09/02/2014 Edwyna Shell RN, BSN, CM Patient has no DC needs at this time, will continue to follow

## 2014-09-06 NOTE — Discharge Summary (Signed)
Physician Discharge Summary  Elsey Holts HQI:[id number] DOB: 1926-11-03 DOA: 09/01/2014  PCP: Rachell Cipro, MD  Admit date: 09/01/2014 Discharge date: 09/06/2014  Recommendations for Outpatient Follow-up:  1. F/u with Dr. Trudie Reed ASAP for follow up of possible lower extremity/small vessel vasculitis 2. PENDING:  Skin biopsy pathology, CH50, C3,C4, Hepatitis B&C, cryoglobulin, SPEP, RF, ANA, anti-ribonucleic acid, anti-smith, anti-dsDNA, ANCA 3. PCP to also follow up on these tests and to reevaluate her legs in a few days.  If looking like there may be some superinfection, consider levofloxacin.   4. Please repeat CBC to f/u on anemia and further evaluation if not already complete. 5. Restarted her on diltiazem (had not been taking recently) at 145m daily.    Discharge Diagnoses:  Cellulitis vs. Vasculitis of bilateral lower extremities   HTN (hypertension)   Atrial fibrillation   Recurrent cellulitis of lower leg   Essential hypertension  Discharge Condition: stable, improved  Diet recommendation: healthy heart  Wt Readings from Last 3 Encounters:  09/01/14 68.04 kg (150 lb)  08/28/14 68.04 kg (150 lb)  08/17/14 68.04 kg (150 lb)    History of present illness:  This is a 80y.o. year old female with significant past medical history of recurrent LE cellulitis, venous insufficiency, moderate aortic stenosis, atrial fibrillation-now NSR not on anticoagulation presenting with recurrent LE cellulitis. Pt reports being admitted in the past (03/2014) for recurrent cellulitis. Had failed outpt clindamycin at the time. Required IV vancomycin and was discharged on extended course of doxy.  Cellulitis involved LLE at the time and very gradually improved but never fully went away.  Pt states that she has had progressive RLE redness and pain and was seen on 08/17/2014.  She was started on doxycycline with minimal improvement.  She was changed to clindamycin which also did not help.  She was  admitted for cellulitis that did not respond to outpatient antibiotics and started on vancomycin.    Pt noted to have been admitted last month for new onset afib. Converted to NSR. Was d/c'd on diltiazem. Pt states that she is not taking cardizem because " I dont feel good when I take it".Denies any CP, SOB, palpitations.   Hospital Course:   Bilateral lower extremity areas of erythema and pain initially.  DDx included bacterial cellulitis, atypical infecion, superinfection of underlying other medical problem, vasculitis.  She was started on vancomycin and had no improvement over several days.  Ceftriaxone was added which may have slightly decreased her erythema, however, since she had recently been on doxycycline, ceftriaxone was not adding much to coverage.  Wound culture did not grow MRSA or Strep.  She also developed some new pustules on her left anterior ankle, a new erythematous area on her medial left ankle while on these antibiotics.  Case was discussed with Dr. CLinus Salmonswho agreed with stopping antibiotics and performing further diagnostic testing for other causes.  General Surgery performed skin biopsy on 11/6. Vasculitis labs were sent.  ESR was 79.  HIV NR. Rest of labs are pending.  Inflammatory disorder may also explain her fatigue over the last few months.  Recommend that she stop her antibiotics and monitor clinically for signs of worsening infection.  She was observed for two days after stopping antibiotics with no worsening of her erythema.  She may use topical antibiotics as needed to crusted area on right shin and she should let her PCP know if she does develop new signs of developing infection (spreading redness, oozing, fevers, etc).  Levofloxacin could be tried as monotherapy for superinfection.  She does not have MRSA, but this would cover most gram positives and gram negatives including pseudomonas.  Anaerobes would be unlikely.  I think she may benefit from a trial of steroids, but I  will defer this to her rheumatologist after more test results have returned.    PAF, noncompliant with rate control medications. Intolerant of BB, verapamil, and stopped her cardizem a few months ago. NSR on EKG. Tele: Sinus arrhythmia, rate controlled, d/c'd telemetry on 11/4. - Had GIB on xarelto 2 months ago so currently not anticoagulation candidate  - She agreed to try cardizem 184m daily and has tolerated well - Advised her to call cardiology if she is not tolerating this dose of cardizem  HTN, BPs improving after resuming some home medications - Continue ARB and HCTZ  - Continue cardizem 120 mg daily  Aortic stenosis, mild to moderate -ECHO 12/2013 EF 60-65%, mild LVH, mild aortic stenosis   Anemia, repeat CBC on Monday or by PCP  Hx of GIB associated with w/ xarelto -not on anticoagulation  Consultants:  General surgery  Procedures:  Skin biopsy on 11/6 >>  Antibiotics:  vanco 11/3 >> 11/6  Ceftriaxone 11/5 >> 11/6  Discharge Exam: Filed Vitals:   09/06/14 0600  BP: 144/59  Pulse: 58  Temp: 97.7 F (36.5 C)  Resp: 18   Filed Vitals:   09/05/14 0533 09/05/14 1404 09/05/14 2127 09/06/14 0600  BP: 145/56 121/76 145/57 144/59  Pulse: 64 73 64 58  Temp: 97.9 F (36.6 C) 98.2 F (36.8 C) 98.8 F (37.1 C) 97.7 F (36.5 C)  TempSrc: Oral Oral Oral Oral  Resp: '18 18 18 18  ' Height:      Weight:      SpO2: 100% 100% 100% 100%     General: WF, No acute distress, stable from prior  HEENT: NCAT, MMM  Cardiovascular: RRR, nl S1, S2 no mrg, 2+ pulses, warm extremities  Respiratory: CTAB, no increased WOB  Abdomen: NABS, soft, NT/ND  MSK: Normal tone and bulk, 1+ bilateral LEE  Neuro: Grossly intact  Skin: 3 cm area on left shin with minimal erythema. 2cm area of erythema on medial left ankle. Two pustules on anterior left ankle. 15cm erythematous area on right shin with central linea skin tear/ulceration with honey crusting and  slight duskiness surrounding area without well-demarcated line. No fluctuance. Very TTP.    Discharge Instructions      Discharge Instructions    Call MD for:  difficulty breathing, headache or visual disturbances    Complete by:  As directed      Call MD for:  extreme fatigue    Complete by:  As directed      Call MD for:  hives    Complete by:  As directed      Call MD for:  persistant dizziness or light-headedness    Complete by:  As directed      Call MD for:  persistant nausea and vomiting    Complete by:  As directed      Call MD for:  redness, tenderness, or signs of infection (pain, swelling, redness, odor or green/yellow discharge around incision site)    Complete by:  As directed      Call MD for:  severe uncontrolled pain    Complete by:  As directed      Call MD for:  temperature >100.4    Complete by:  As directed  Diet - low sodium heart healthy    Complete by:  As directed      Discharge instructions    Complete by:  As directed   You were hospitalized with redness and pain in your legs.  You have been treated with multiple kinds of antibiotics without improvement.  Please stop your antibiotics for now and use topical antibiotics only on your right leg.  We did a biopsy and sent several tests for vasculitis which may also be causing your legs to be red and painful.  Dr. Trudie Reed and your primary care doctor should follow up on these test results.  They can recommend further treatment options based on those results.  Two other changes to your medications:  First, I have given you a prescription for the decreased diltiazem dose.  Second, there is a drug-drug interaction between your vytorin and your diltiazem so please stop your vytorin.  I have give you prescriptions for cholesterol medications to use instead that do not have that drug-drug interaction.     Discharge wound care:    Complete by:  As directed   May apply your bactroban to the right leg twice a day and  leave open to air when able or cover with dry dressing at night if more convenient.     Increase activity slowly    Complete by:  As directed             Medication List    STOP taking these medications        clindamycin 300 MG capsule  Commonly known as:  CLEOCIN     ezetimibe-simvastatin 10-40 MG per tablet  Commonly known as:  VYTORIN      TAKE these medications        acetaminophen 500 MG tablet  Commonly known as:  TYLENOL  Take 1,000 mg by mouth every 6 (six) hours as needed (pain).     aspirin 81 MG chewable tablet  Chew 81 mg by mouth daily.     calcium carbonate 600 MG Tabs tablet  Commonly known as:  OS-CAL  Take 1,800 mg by mouth daily.     cyanocobalamin 500 MCG tablet  Take 500 mcg by mouth daily.     diclofenac sodium 1 % Gel  Commonly known as:  VOLTAREN  Apply 2 g topically 4 (four) times daily as needed (For pain).     dicyclomine 20 MG tablet  Commonly known as:  BENTYL  Take 10 mg by mouth every 6 (six) hours as needed for spasms. For stomach irritability.     diltiazem 120 MG 24 hr capsule  Commonly known as:  CARDIZEM CD  Take 1 capsule (120 mg total) by mouth daily.     estrogens (conjugated) 0.625 MG tablet  Commonly known as:  PREMARIN  Take 0.625 mg by mouth daily.     ezetimibe 10 MG tablet  Commonly known as:  ZETIA  Take 1 tablet (10 mg total) by mouth daily.     Fish Oil 1000 MG Caps  Take 1 capsule by mouth daily.     levothyroxine 75 MCG tablet  Commonly known as:  SYNTHROID, LEVOTHROID  Take 75 mcg by mouth daily.     losartan-hydrochlorothiazide 100-12.5 MG per tablet  Commonly known as:  HYZAAR  Take 1 tablet by mouth daily.     medroxyPROGESTERone 2.5 MG tablet  Commonly known as:  PROVERA  Take 5 mg by mouth as directed. For 10 days each month  20th-30th     mupirocin cream 2 %  Commonly known as:  BACTROBAN  Apply 1 application topically 2 (two) times daily.     olopatadine 0.1 % ophthalmic solution   Commonly known as:  PATANOL  Place 1 drop into both eyes 2 (two) times daily.     pravastatin 80 MG tablet  Commonly known as:  PRAVACHOL  Take 1 tablet (80 mg total) by mouth daily at 6 PM.     PRESERVISION AREDS 2 Caps  Take 1 capsule by mouth 2 (two) times daily.       Follow-up Information    Follow up with [facility], MD In 3 days.   Specialty:  Family Medicine   Contact information:   [address] [place] [address] [phone]       Follow up with [facility], MD. Call in 1 day.   Specialty:  Rheumatology   Why:  for appointment ASAP   Contact information:   [facility] [address] [place] [address] [phone]        The results of significant diagnostics from this hospitalization (including imaging, microbiology, ancillary and laboratory) are listed below for reference.    Significant Diagnostic Studies: No results found.  Microbiology: Recent Results (from the past 240 hour(s))  Wound culture     Status: None   Collection Time: 09/02/14  5:50 AM  Result Value Ref Range Status   Specimen Description LEG RIGHT  Final   Special Requests NONE  Final   Gram Stain   Final    NO WBC SEEN NO SQUAMOUS EPITHELIAL CELLS SEEN NO ORGANISMS SEEN Performed at [facility]    Culture   Final    MULTIPLE ORGANISMS PRESENT, NONE PREDOMINANT Note: NO STAPHYLOCOCCUS AUREUS ISOLATED NO GROUP A STREP (S.PYOGENES) ISOLATED Performed at [facility]    Report Status 09/04/2014 FINAL  Final     Labs: Basic Metabolic Panel:  Recent Labs Lab 09/01/14 1533 09/01/14 2036 09/02/14 0440 09/02/14 1330 09/04/14 0527 09/04/14 0540  NA 137  --  138 137  --  138  K 4.6  --  4.4 4.6  --  4.7  CL 98  --  101 100  --  98  CO2 26  --  26 24  --  24  GLUCOSE 106*  --  110* 148*  --  97  BUN 26*  --  21 19  --  18  CREATININE 1.10 1.06 0.99 0.94 1.11* 1.09  CALCIUM 9.7  --  9.9 10.1  --  9.5    Liver Function Tests:  Recent Labs Lab 09/01/14 1533 09/02/14 0440  AST 21 15  ALT 7 6  ALKPHOS 75 69  BILITOT 0.3 0.2*  PROT 7.2 6.7  ALBUMIN 3.0* 2.7*   No results for input(s): LIPASE, AMYLASE in the last 168 hours. No results for input(s): AMMONIA in the last 168 hours. CBC:  Recent Labs Lab 09/01/14 1533 09/01/14 2036 09/02/14 0440  WBC 9.8 8.6 8.0  NEUTROABS 6.6  --  5.1  HGB 11.5* 11.5* 10.5*  HCT 34.9* 34.9* 32.1*  MCV 89.7 87.9 88.7  PLT 356 370 310   Cardiac Enzymes: No results for input(s): CKTOTAL, CKMB, CKMBINDEX, TROPONINI in the last 168 hours. BNP: BNP (last 3 results) No results for input(s): PROBNP in the last 8760 hours. CBG: No results for input(s): GLUCAP in the last 168 hours.  Time coordinating discharge: 45 minutes  Signed:  Janece Canterbury  [facility] 09/06/2014, 9:53 AM

## 2014-09-07 ENCOUNTER — Telehealth: Payer: Self-pay | Admitting: Internal Medicine

## 2014-09-07 LAB — ANTI-RIBONUCLEIC ACID ANTIBODY: Sm/rnp: <1

## 2014-09-07 LAB — ANCA SCREEN W REFLEX TITER
Atypical p-ANCA Screen: NEGATIVE
c-ANCA Screen: NEGATIVE
p-ANCA Screen: NEGATIVE

## 2014-09-07 LAB — ANTI-SMITH ANTIBODY: ENA SM Ab Ser-aCnc: <1

## 2014-09-07 LAB — ANA: Anti Nuclear Antibody(ANA): POSITIVE — AB

## 2014-09-07 LAB — HEPATITIS B SURFACE ANTIGEN: Hepatitis B Surface Ag: NEGATIVE

## 2014-09-07 LAB — ANTI-NUCLEAR AB-TITER (ANA TITER): ANA Titer 1: NEGATIVE

## 2014-09-07 LAB — HEPATITIS C ANTIBODY: HCV Ab: NEGATIVE

## 2014-09-07 LAB — ANTI-DNA ANTIBODY, DOUBLE-STRANDED: ds DNA Ab: 3 IU/mL

## 2014-09-07 LAB — RHEUMATOID FACTOR: Rhuematoid fact SerPl-aCnc: 21 IU/mL — ABNORMAL HIGH (ref <=14)

## 2014-09-07 LAB — HEPATITIS B SURFACE ANTIBODY,QUALITATIVE: Hep B S Ab: NEGATIVE

## 2014-09-07 NOTE — Telephone Encounter (Signed)
Vasculitis labs overall unremarkable.  Skin biopsy reveals abscess.  Case discussed with Dr. Linus Salmons from infectious disease who recommends two week course of clinda + keflex.  Renewed mupirocin and added florastor.  Patient notified of results and prescriptions called to her preferred [facility].

## 2014-09-08 DIAGNOSIS — D509 Iron deficiency anemia, unspecified: Secondary | ICD-10-CM | POA: Diagnosis not present

## 2014-09-08 LAB — PROTEIN ELECTROPHORESIS, SERUM
Albumin ELP: 45.3 % — ABNORMAL LOW (ref 55.8–66.1)
Alpha-1-Globulin: 7.6 % — ABNORMAL HIGH (ref 2.9–4.9)
Alpha-2-Globulin: 14.7 % — ABNORMAL HIGH (ref 7.1–11.8)
Beta 2: 6 % (ref 3.2–6.5)
Beta Globulin: 6.9 % (ref 4.7–7.2)
Gamma Globulin: 19.5 % — ABNORMAL HIGH (ref 11.1–18.8)
M-Spike, %: NOT DETECTED g/dL
Total Protein ELP: 5.4 g/dL — ABNORMAL LOW (ref 6.0–8.3)

## 2014-09-08 LAB — C4 COMPLEMENT: Complement C4, Body Fluid: 31 mg/dL (ref 10–40)

## 2014-09-08 LAB — C3 COMPLEMENT: C3 Complement: 189 mg/dL — ABNORMAL HIGH (ref 90–180)

## 2014-09-09 LAB — COMPLEMENT, TOTAL: Compl, Total (CH50): >60 U/mL — ABNORMAL HIGH (ref 31–60)

## 2014-09-11 LAB — CRYOGLOBULIN

## 2014-09-21 ENCOUNTER — Ambulatory Visit (INDEPENDENT_AMBULATORY_CARE_PROVIDER_SITE_OTHER): Payer: Medicare Other | Admitting: Infectious Disease

## 2014-09-21 ENCOUNTER — Encounter: Payer: Self-pay | Admitting: Infectious Disease

## 2014-09-21 VITALS — BP 182/80 | HR 77 | Temp 98.9°F | Wt 148.0 lb

## 2014-09-21 DIAGNOSIS — I872 Venous insufficiency (chronic) (peripheral): Secondary | ICD-10-CM | POA: Diagnosis not present

## 2014-09-21 DIAGNOSIS — I6529 Occlusion and stenosis of unspecified carotid artery: Secondary | ICD-10-CM

## 2014-09-21 DIAGNOSIS — L03119 Cellulitis of unspecified part of limb: Secondary | ICD-10-CM

## 2014-09-21 DIAGNOSIS — I10 Essential (primary) hypertension: Secondary | ICD-10-CM | POA: Diagnosis not present

## 2014-09-21 DIAGNOSIS — R600 Localized edema: Secondary | ICD-10-CM | POA: Diagnosis not present

## 2014-09-21 DIAGNOSIS — M79604 Pain in right leg: Secondary | ICD-10-CM

## 2014-09-21 DIAGNOSIS — L52 Erythema nodosum: Secondary | ICD-10-CM | POA: Diagnosis not present

## 2014-09-21 DIAGNOSIS — M069 Rheumatoid arthritis, unspecified: Secondary | ICD-10-CM

## 2014-09-21 MED ORDER — PREDNISONE 10 MG PO TABS: 40.0000 mg | ORAL_TABLET | Freq: Every day | ORAL | Status: DC

## 2014-09-21 NOTE — Progress Notes (Signed)
Subjective:    Patient ID: [name], female    DOB: 01/20/1927, 80 y.o.   MRN: [id number]  HPI  80 year old Caucasian lady with past medical history significant for aortic stenosis, atrial fibrillation, venous insufficiency, rheumatoid arthritis who states that after she had had laser surgery to her lower extremities to treat her venous insufficiency that she then developed painful erythematous lesions on her bilateral ankles which have been treated since May 2015 repeated with the with oral and intravenous antibiotics. She was notably admitted several times in June and apparently was given protracted course of IV antibiotics. She's been treated with various courses of antibiotics including clindamycin and amoxicillin clavulanic acid cephalexin doxycycline and IV vancomycin. During her most recent admission there was a workup for vasculitis undertaken including a biopsy performed by central callus surgery. The biopsy came back and as being red with being consistent with an abscess though the pictures that I have seen from the photographs taken by central callus surgery and the clinical appearance and course the patient do not fit with an abscess or purulent infection.  She currently comes to me still on oral clindamycin and cephalexin still complaining of painful lesions both on her right lower extremity and left lower extremity. The right lesion is worse in pain and area of involvement. Notably it worsened after the biopsy performed by central surgery.  Her labs done as an inpatient were pertinent for an elevated sedimentation rate above 70 positive rheumatoid factor.  She was supposed to see her rheumatologist Dr. Posey Rea, but this appointment was canceled after the remainder of her serological workup was fairly unremarkable.  The patient tells me though that she has been given a prior diagnosis of rheumatoid arthritis and been treated with corticosteroids by Dr. Lenna Gilford albeit at lower  doses of 10 mg per day.  She denies having fevers nausea or malaise or systemic symptoms with her outbreaks of painful lesions on her legs. These have been repeatedly treated with the clinical diagnosis of cellulitis now 6 months in.    Review of Systems  Constitutional: Negative for fever, chills, diaphoresis, activity change, appetite change, fatigue and unexpected weight change.  HENT: Negative for congestion, rhinorrhea, sinus pressure, sneezing, sore throat and trouble swallowing.   Eyes: Negative for photophobia and visual disturbance.  Respiratory: Negative for cough, chest tightness, shortness of breath, wheezing and stridor.   Cardiovascular: Negative for chest pain, palpitations and leg swelling.  Gastrointestinal: Negative for nausea, vomiting, abdominal pain, diarrhea, constipation, blood in stool, abdominal distention and anal bleeding.  Genitourinary: Negative for dysuria, hematuria, flank pain and difficulty urinating.  Musculoskeletal: Positive for myalgias, joint swelling and arthralgias. Negative for back pain and gait problem.  Skin: Positive for color change, rash and wound. Negative for pallor.  Neurological: Negative for dizziness, tremors, weakness and light-headedness.  Hematological: Negative for adenopathy. Does not bruise/bleed easily.  Psychiatric/Behavioral: Negative for behavioral problems, confusion, sleep disturbance, dysphoric mood, decreased concentration and agitation.        Objective:   Physical Exam  Constitutional: She is oriented to person, place, and time. She appears well-nourished. No distress.  HENT:  Head: Normocephalic and atraumatic.  Mouth/Throat: Oropharynx is clear and moist. No oropharyngeal exudate.  Eyes: Conjunctivae and EOM are normal. No scleral icterus.  Neck: Normal range of motion. Neck supple.  Cardiovascular: Normal rate.   Pulmonary/Chest: Effort normal. No respiratory distress. She has no wheezes.  Abdominal: Soft. She  exhibits no distension.  Musculoskeletal: She exhibits edema  and tenderness.  Neurological: She is alert and oriented to person, place, and time. She exhibits normal muscle tone. Coordination normal.  Skin: Skin is warm and dry. She is not diaphoretic. No erythema. No pallor.  Psychiatric: She has a normal mood and affect. Her behavior is normal. Judgment and thought content normal.  Lesions present during hospitalization picture by CCS:  Right LE:    LLE     Painful erythematous lesion right calf with skin thickening and darkening todays exam [id number]     Left LE: small painful nodule on interior calf:     Assessment & Plan:   Painful persistent nodules on bilateral shins of patient with known RA that have NEVER resolved with oral antibiotics. While her biopsy was read as being consistent with abscess again I do not find her to have any clinical evidence for abscess and I find this, of course did not be consistent with a typical bacterial infection. Instead given the painful nature of these nodules I would favor a noninfectious conditions such as erythema no dosum, or even pyoderma gangrenosum --in particular given worsening post biopsy to be much more likely  After thorough discussion with the patient and the son we decided together to pursue a therapeutic trial of high-dose corticosteroids.  I informed the patient and her son the typically upper for  a rheumatologist or dermatologist or the primary care physician to initiate corticosteroids.  I also warned them that if the patient in fact did have an underlying infection that the corticosteroids would likely make that infection worse.  However after exhaustive discussion regarding the risks and benefits the patient wished to proceed with a therapeutic trial.  Therefore I'll start the patient on 40 mg of prednisone per day. Given her sufficient prednisone so that she can be on it for 10 days straight.  She will also stop her  oral antibiotics  Certainly should she experience any worsening of her symptoms she should contact me immediately or one of my colleagues who is covering for me.  We entertained delaying this therapeutic trials after the thanks giving holidays the patient wished to proceed prior to the holidays read  We will plan on checking in with her on Wednesday just before the holidays take in.  I spent greater than 45  minutes with the patient including greater than 50% of time in face to face counsel of the patient and in coordination of their care.  HTN: she may need careful followup of her BP as she was already elevated here prior to initiation of a therapeutic trial. Hopefully, stairs will improve her condition and her pain level will proceed.

## 2014-09-21 NOTE — Patient Instructions (Signed)
I think that your painful lesions on your legs are either due to [place] or another autoimmune condition that will need steroids to get better  Try the 40mg  dose of prednisone for ten days  STop antibiotics  If leg worsens let us know immediately

## 2014-09-29 ENCOUNTER — Ambulatory Visit: Payer: Medicare Other | Admitting: Internal Medicine

## 2014-09-29 DIAGNOSIS — L982 Febrile neutrophilic dermatosis [Sweet]: Secondary | ICD-10-CM | POA: Diagnosis not present

## 2014-09-29 DIAGNOSIS — R5383 Other fatigue: Secondary | ICD-10-CM | POA: Diagnosis not present

## 2014-09-29 DIAGNOSIS — L97909 Non-pressure chronic ulcer of unspecified part of unspecified lower leg with unspecified severity: Secondary | ICD-10-CM | POA: Diagnosis not present

## 2014-09-29 DIAGNOSIS — M0589 Other rheumatoid arthritis with rheumatoid factor of multiple sites: Secondary | ICD-10-CM | POA: Diagnosis not present

## 2014-10-05 ENCOUNTER — Encounter: Payer: Self-pay | Admitting: Vascular Surgery

## 2014-10-05 ENCOUNTER — Ambulatory Visit (INDEPENDENT_AMBULATORY_CARE_PROVIDER_SITE_OTHER): Payer: Medicare Other | Admitting: Infectious Disease

## 2014-10-05 ENCOUNTER — Encounter: Payer: Self-pay | Admitting: Infectious Disease

## 2014-10-05 VITALS — BP 161/97 | HR 147 | Temp 98.4°F | Wt 148.0 lb

## 2014-10-05 DIAGNOSIS — I350 Nonrheumatic aortic (valve) stenosis: Secondary | ICD-10-CM | POA: Diagnosis not present

## 2014-10-05 DIAGNOSIS — I6529 Occlusion and stenosis of unspecified carotid artery: Secondary | ICD-10-CM | POA: Diagnosis not present

## 2014-10-05 DIAGNOSIS — I10 Essential (primary) hypertension: Secondary | ICD-10-CM | POA: Diagnosis not present

## 2014-10-05 DIAGNOSIS — E785 Hyperlipidemia, unspecified: Secondary | ICD-10-CM | POA: Diagnosis not present

## 2014-10-05 DIAGNOSIS — M069 Rheumatoid arthritis, unspecified: Secondary | ICD-10-CM

## 2014-10-05 DIAGNOSIS — L52 Erythema nodosum: Secondary | ICD-10-CM

## 2014-10-05 DIAGNOSIS — I878 Other specified disorders of veins: Secondary | ICD-10-CM | POA: Diagnosis not present

## 2014-10-05 DIAGNOSIS — I480 Paroxysmal atrial fibrillation: Secondary | ICD-10-CM | POA: Diagnosis not present

## 2014-10-05 DIAGNOSIS — L0390 Cellulitis, unspecified: Secondary | ICD-10-CM | POA: Diagnosis not present

## 2014-10-05 DIAGNOSIS — L88 Pyoderma gangrenosum: Secondary | ICD-10-CM

## 2014-10-05 DIAGNOSIS — D509 Iron deficiency anemia, unspecified: Secondary | ICD-10-CM | POA: Diagnosis not present

## 2014-10-05 DIAGNOSIS — E039 Hypothyroidism, unspecified: Secondary | ICD-10-CM | POA: Diagnosis not present

## 2014-10-05 NOTE — Progress Notes (Signed)
Subjective:    Patient ID: [name], female    DOB: 10-22-1927, 80 y.o.   MRN: [id number]  HPI   80 year old Caucasian lady with past medical history significant for aortic stenosis, atrial fibrillation, venous insufficiency, rheumatoid arthritis who states that after she had had laser surgery to her lower extremities to treat her venous insufficiency that she then developed painful erythematous lesions on her bilateral ankles which have been treated since May 2015 repeated with the with oral and intravenous antibiotics. She was notably admitted several times in June and apparently was given protracted course of IV antibiotics. She's been treated with various courses of antibiotics including clindamycin and amoxicillin clavulanic acid cephalexin doxycycline and IV vancomycin. During her most recent admission there was a workup for vasculitis undertaken including a biopsy performed by central callus surgery. The biopsy came back and as being red with being consistent with an abscess though the pictures that I have seen from the photographs taken by central callus surgery and the clinical appearance and course the patient do not fit with an abscess or purulent infection.  She originally came to me still on oral clindamycin and cephalexin still complaining of painful lesions both on her right lower extremity and left lower extremity. The right lesion is worse in pain and area of involvement. Notably it worsened after the biopsy performed by central surgery.  Her labs done as an inpatient were pertinent for an elevated sedimentation rate above 70 positive rheumatoid factor.  She was supposed to see her rheumatologist Dr. Trudie Reed, but this appointment was canceled after the remainder of her serological workup was fairly unremarkable.  The patient tells me though that she has been given a prior diagnosis of rheumatoid arthritis and been treated with corticosteroids albeit at lower doses of 10 mg  per day.  She denies having fevers nausea or malaise or systemic symptoms with her outbreaks of painful lesions on her legs. These have been repeatedly treated with the clinical diagnosis of cellulitis now 6 months in.  When I saw her I thought her exam was much more consistent with ERYTHEMA NODOSUM or possibly PYODERMA GANGRENOSUM and I started her on prednisone 40mg  daily and stopped her oral antibiotics. She has had dramatic improvement in her   Review of Systems  Constitutional: Negative for fever, chills, diaphoresis, activity change, appetite change, fatigue and unexpected weight change.  HENT: Negative for congestion, rhinorrhea, sinus pressure, sneezing, sore throat and trouble swallowing.   Eyes: Negative for photophobia and visual disturbance.  Respiratory: Negative for cough, chest tightness, shortness of breath, wheezing and stridor.   Cardiovascular: Negative for chest pain, palpitations and leg swelling.  Gastrointestinal: Negative for nausea, vomiting, abdominal pain, diarrhea, constipation, blood in stool, abdominal distention and anal bleeding.  Genitourinary: Negative for dysuria, hematuria, flank pain and difficulty urinating.  Musculoskeletal: Positive for myalgias, joint swelling and arthralgias. Negative for back pain and gait problem.  Skin: Positive for color change, rash and wound. Negative for pallor.  Neurological: Negative for dizziness, tremors, weakness and light-headedness.  Hematological: Negative for adenopathy. Does not bruise/bleed easily.  Psychiatric/Behavioral: Negative for behavioral problems, confusion, sleep disturbance, dysphoric mood, decreased concentration and agitation.        Objective:   Physical Exam  Constitutional: She is oriented to person, place, and time. She appears well-nourished. No distress.  HENT:  Head: Normocephalic and atraumatic.  Mouth/Throat: Oropharynx is clear and moist. No oropharyngeal exudate.  Eyes: Conjunctivae and  EOM are normal. No scleral  icterus.  Neck: Normal range of motion. Neck supple.  Cardiovascular: Normal rate.   Pulmonary/Chest: Effort normal. No respiratory distress. She has no wheezes.  Abdominal: Soft. She exhibits no distension.  Musculoskeletal: She exhibits edema and tenderness.  Neurological: She is alert and oriented to person, place, and time. She exhibits normal muscle tone. Coordination normal.  Skin: Skin is warm and dry. She is not diaphoretic. No erythema. No pallor.  Psychiatric: She has a normal mood and affect. Her behavior is normal. Judgment and thought content normal.  Lesions present during hospitalization picture by CCS:  Right LE 09/01/14:     RLE      RLE today 10/05/14:       LLE 09/21/14 :     Exam today 10/05/14:          Assessment & Plan:   Painful persistent nodules on bilateral shins of patient with known RA that have NEVER resolved with oral antibiotics.   MY THERAPEUTIC TRIAL WITH STEROIDS WORKS meanging this must be E. NODOSUM or Pyoderma gangrenosum (perhaps the latter is more likely given that her leg worsened after biospy)  She is now seeing Dr. Trudie Reed and is to begin Remicaid after steroid taper   I spent greater than 25  minutes with the patient including greater than 50% of time in face to face counsel of the patient and in coordination of their care.

## 2014-10-06 ENCOUNTER — Encounter: Payer: Self-pay | Admitting: Vascular Surgery

## 2014-10-06 ENCOUNTER — Ambulatory Visit (INDEPENDENT_AMBULATORY_CARE_PROVIDER_SITE_OTHER): Payer: Medicare Other | Admitting: Vascular Surgery

## 2014-10-06 VITALS — BP 146/104 | HR 140 | Resp 18 | Ht 58.5 in | Wt 151.0 lb

## 2014-10-06 DIAGNOSIS — I6529 Occlusion and stenosis of unspecified carotid artery: Secondary | ICD-10-CM

## 2014-10-06 DIAGNOSIS — I83009 Varicose veins of unspecified lower extremity with ulcer of unspecified site: Secondary | ICD-10-CM

## 2014-10-06 DIAGNOSIS — L97909 Non-pressure chronic ulcer of unspecified part of unspecified lower leg with unspecified severity: Secondary | ICD-10-CM

## 2014-10-06 NOTE — Progress Notes (Signed)
Here today for continued discussion of venous pathology. She has had progressive difficulty in both lower extremities following my last visit with her in the spring. She is seeing rheumatology and infectious disease with several different potential diagnoses regarding skin changes in her lower extremity. Fortunately she is currently improving. She's had been near complete healing of the superficial ulcerations of both lower extremities.  She does have a continued improvement in her discomfort as well.  Past Medical History  Diagnosis Date  . HTN (hypertension)   . HLD (hyperlipidemia)   . Hypothyroidism   . Aortic stenosis, moderate     last echo in 2010; AVA 1.1; mild AS per echo in June 2013  . Edema of both legs     s/p laser treatment per Dr. Donnetta Hutching  . A-fib     History  Substance Use Topics  . Smoking status: Former Smoker    Types: Cigarettes    Quit date: 10/31/1983  . Smokeless tobacco: Never Used  . Alcohol Use: No    Family History  Problem Relation Age of Onset  . Peripheral vascular disease    . Heart disease Mother   . Peripheral vascular disease Mother   . Heart disease Father   . Peripheral vascular disease Father     Right leg amputation  . Hypertension Sister   . Heart disease Brother 50    Heart Disease before age 10    Allergies  Allergen Reactions  . Aspirin Nausea And Vomiting    Low Dose is ok  . Codeine Nausea And Vomiting    "Deathly Sick"  . Coreg [Carvedilol] Other (See Comments)    Fatigue/ ill  . Penicillins Rash    Current outpatient prescriptions: acetaminophen (TYLENOL) 500 MG tablet, Take 1,000 mg by mouth every 6 (six) hours as needed (pain). , Disp: , Rfl: ;  aspirin 81 MG chewable tablet, Chew 81 mg by mouth daily., Disp: , Rfl: ;  calcium carbonate (OS-CAL) 600 MG TABS, Take 1,800 mg by mouth daily., Disp: , Rfl: ;  diclofenac sodium (VOLTAREN) 1 % GEL, Apply 2 g topically 4 (four) times daily as needed (For pain)., Disp: , Rfl:   dicyclomine (BENTYL) 20 MG tablet, Take 10 mg by mouth every 6 (six) hours as needed for spasms. For stomach irritability., Disp: , Rfl: ;  diltiazem (CARDIZEM CD) 120 MG 24 hr capsule, Take 1 capsule (120 mg total) by mouth daily., Disp: 30 capsule, Rfl: 0;  estrogens, conjugated, (PREMARIN) 0.625 MG tablet, Take 0.625 mg by mouth daily. , Disp: , Rfl:  levothyroxine (SYNTHROID, LEVOTHROID) 75 MCG tablet, Take 75 mcg by mouth daily.  , Disp: , Rfl: ;  losartan-hydrochlorothiazide (HYZAAR) 100-12.5 MG per tablet, Take 1 tablet by mouth daily., Disp: , Rfl: ;  medroxyPROGESTERone (PROVERA) 2.5 MG tablet, Take 5 mg by mouth as directed. For 10 days each month 20th-30th, Disp: , Rfl: ;  olopatadine (PATANOL) 0.1 % ophthalmic solution, Place 1 drop into both eyes 2 (two) times daily., Disp: , Rfl:  Omega-3 Fatty Acids (FISH OIL) 1000 MG CAPS, Take 1 capsule by mouth daily. , Disp: , Rfl: ;  pravastatin (PRAVACHOL) 80 MG tablet, Take 1 tablet (80 mg total) by mouth daily at 6 PM., Disp: 30 tablet, Rfl: 0;  predniSONE (DELTASONE) 10 MG tablet, Take 4 tablets (40 mg total) by mouth daily with breakfast., Disp: 40 tablet, Rfl: 0;  cyanocobalamin 500 MCG tablet, Take 500 mcg by mouth daily., Disp: , Rfl:  ezetimibe (ZETIA) 10 MG tablet, Take 1 tablet (10 mg total) by mouth daily. (Patient not taking: Reported on 10/06/2014), Disp: 30 tablet, Rfl: 0;  Multiple Vitamins-Minerals (PRESERVISION AREDS 2) CAPS, Take 1 capsule by mouth 2 (two) times daily., Disp: , Rfl: ;  mupirocin cream (BACTROBAN) 2 %, Apply 1 application topically 2 (two) times daily. (Patient not taking: Reported on 10/06/2014), Disp: 15 g, Rfl: 0 mupirocin ointment (BACTROBAN) 2 %, Apply 1 application topically 3 (three) times daily., Disp: , Rfl: ;  saccharomyces boulardii (FLORASTOR) 250 MG capsule, Take 250 mg by mouth 2 (two) times daily., Disp: , Rfl:   BP 146/104 mmHg  Pulse 140  Resp 18  Ht 4' 10.5" (1.486 m)  Wt 151 lb (68.493 kg)  BMI  31.02 kg/m2  Body mass index is 31.02 kg/(m^2).       Physical exam she does have a 2+ dorsalis pedis pulses bilaterally. She does have no open ulcerations of her lower extremity but does have changes of chronic venous hypertension with thickening of her skin in the medial aspect from the below-knee position to her ankles. No venous varicosities are present.  I did review her formal venous duplex from several months ago. This does show reflux in her deep system bilaterally. I did image her cervix veins with SonoSite ultrasound do not see any evidence of severe venous hypertension in the surface veins currently. She has had bilateral great saphenous vein ablation  Had long discussion with patient. Explained I do not see any evidence of risk for limb threatening problems. This is arterial issue and she has no evidence of arterial insufficiency. She is concerned because her father did have an amputation prior to his death. Her venous hypertension is causing whatever is the etiology of her lower extremity issues. I felt this certainly is making it somewhat more difficult to treat. I did explain her only treatment option for her deep venous reflux is elevation and compression when possible. She was relieved with this discussion will see [name] again on as-needed basis

## 2014-10-13 DIAGNOSIS — M0589 Other rheumatoid arthritis with rheumatoid factor of multiple sites: Secondary | ICD-10-CM | POA: Diagnosis not present

## 2014-10-15 ENCOUNTER — Ambulatory Visit: Payer: Medicare Other | Admitting: Cardiovascular Disease

## 2014-10-27 DIAGNOSIS — M0589 Other rheumatoid arthritis with rheumatoid factor of multiple sites: Secondary | ICD-10-CM | POA: Diagnosis not present

## 2014-11-09 DIAGNOSIS — N183 Chronic kidney disease, stage 3 (moderate): Secondary | ICD-10-CM | POA: Diagnosis not present

## 2014-11-09 DIAGNOSIS — I129 Hypertensive chronic kidney disease with stage 1 through stage 4 chronic kidney disease, or unspecified chronic kidney disease: Secondary | ICD-10-CM | POA: Diagnosis not present

## 2014-11-09 DIAGNOSIS — L02416 Cutaneous abscess of left lower limb: Secondary | ICD-10-CM | POA: Diagnosis not present

## 2014-11-17 ENCOUNTER — Ambulatory Visit (INDEPENDENT_AMBULATORY_CARE_PROVIDER_SITE_OTHER): Payer: Medicare Other | Admitting: Cardiovascular Disease

## 2014-11-17 ENCOUNTER — Encounter: Payer: Self-pay | Admitting: Cardiovascular Disease

## 2014-11-17 VITALS — BP 128/72 | HR 107 | Ht 60.0 in | Wt 151.8 lb

## 2014-11-17 DIAGNOSIS — I350 Nonrheumatic aortic (valve) stenosis: Secondary | ICD-10-CM | POA: Diagnosis not present

## 2014-11-17 DIAGNOSIS — I10 Essential (primary) hypertension: Secondary | ICD-10-CM | POA: Diagnosis not present

## 2014-11-17 DIAGNOSIS — I480 Paroxysmal atrial fibrillation: Secondary | ICD-10-CM | POA: Diagnosis not present

## 2014-11-17 MED ORDER — METOPROLOL TARTRATE 25 MG PO TABS: 25.0000 mg | ORAL_TABLET | Freq: Two times a day (BID) | ORAL | Status: DC

## 2014-11-17 NOTE — Patient Instructions (Addendum)
Ask your Rheumatologist about Humira instead of Remicaid.   Your physician has recommended you make the following change in your medication:  START Metoprolol 25 mg twice daily - take 12 hours apart  Your physician recommends that you schedule a follow-up appointment in: 3 months with Dr. Acie Fredrickson

## 2014-11-17 NOTE — Progress Notes (Signed)
Cardiology Office Note   Date:  11/17/2014   ID:  [name], DOB 06/14/1927, MRN [id number]  PCP:  Velna Hatchet, MD  Cardiologist:   Thayer Headings, MD   Chief Complaint  Patient presents with  . Follow-up    atrial fib      History of Present Illness: [name] is a 81 y.o. female who presents for further evaluation of her paroxysmal atrial fibrillation, hypertension, hypothyroidism.  When she was last seen we decreased the dose of her Cardizem. At this point the Cardizem has been completely stopped. She continues to have problems with arthritis.  She has tried predinisone and  Remicaid.    She had lots of issues related to the Remicaid  She has continued to have tachypalpitations.    We tried her on Xarelto about a year ago. She started passing blood in her stool. The Xarelto was discontinued.  We had recommended that she see GI but she was not able to keep that apt.   Past Medical History  Diagnosis Date  . HTN (hypertension)   . HLD (hyperlipidemia)   . Hypothyroidism   . Aortic stenosis, moderate     last echo in 2010; AVA 1.1; mild AS per echo in June 2013  . Edema of both legs     s/p laser treatment per Dr. Donnetta Hutching  . A-fib     Past Surgical History  Procedure Laterality Date  . Appendectomy  1955  . Foot surgery  2003    bilateral Hammer Toe  . Eye surgery  2005    Bilateral cataract  . Endovenous ablation saphenous vein w/ laser  08-29-2012    left greater saphenous vein   Curt Jews MD  . Endovenous ablation saphenous vein w/ laser  09-19-2012    right greater saphenous vein by Curt Jews MD  . Stab phlebectomy Right 01-02-2013    10-15 incisions right thigh and calf by Curt Jews MD  . Carpal tunnel release Right      Current Outpatient Prescriptions  Medication Sig Dispense Refill  . acetaminophen (TYLENOL) 500 MG tablet Take 1,000 mg by mouth every 6 (six) hours as needed (pain).     Marland Kitchen aspirin 81 MG chewable tablet Chew  81 mg by mouth daily.    . calcium carbonate (OS-CAL) 600 MG TABS Take 1,800 mg by mouth daily.    . cyanocobalamin 500 MCG tablet Take 500 mcg by mouth daily.    . diclofenac sodium (VOLTAREN) 1 % GEL Apply 2 g topically 4 (four) times daily as needed (For pain).    Marland Kitchen dicyclomine (BENTYL) 20 MG tablet Take 10 mg by mouth every 6 (six) hours as needed for spasms. For stomach irritability.    Marland Kitchen estrogens, conjugated, (PREMARIN) 0.625 MG tablet Take 0.625 mg by mouth daily.     Marland Kitchen levothyroxine (SYNTHROID, LEVOTHROID) 75 MCG tablet Take 75 mcg by mouth daily.      Marland Kitchen losartan-hydrochlorothiazide (HYZAAR) 100-12.5 MG per tablet Take 1 tablet by mouth daily.    . medroxyPROGESTERone (PROVERA) 2.5 MG tablet Take 5 mg by mouth as directed. For 10 days each month 20th-30th    . olopatadine (PATANOL) 0.1 % ophthalmic solution Place 1 drop into both eyes 2 (two) times daily.    . Omega-3 Fatty Acids (FISH OIL) 1000 MG CAPS Take 1 capsule by mouth daily.     . vitamin E 400 UNIT capsule Take 400 Units by mouth daily.  No current facility-administered medications for this visit.    Allergies:   Aspirin; Codeine; Coreg; and Penicillins    Social History:  The patient  reports that she quit smoking about 31 years ago. Her smoking use included Cigarettes. She has never used smokeless tobacco. She reports that she does not drink alcohol or use illicit drugs.   Family History:  The patient's family history includes Heart disease in her father and mother; Heart disease (age of onset: 102) in her brother; Hypertension in her sister; Peripheral vascular disease in her father, mother, and another family member.    ROS:  Please see the history of present illness.   Otherwise, review of systems are positive for none.   All other systems are reviewed and negative.    PHYSICAL EXAM: VS:  BP 128/72 mmHg  Pulse 107  Ht 5' (1.524 m)  Wt 151 lb 12.8 oz (68.856 kg)  BMI 29.65 kg/m2 , BMI Body mass index is 29.65  kg/(m^2). GEN: Well nourished, well developed, in no acute distress HEENT: normal Neck: no JVD, carotid bruits, or masses Cardiac: irreg. Irreg.  Tachycardic.  Soft systolic murmur Respiratory:  clear to auscultation bilaterally, normal work of breathing GI: soft, nontender, nondistended, + BS MS: compression hose on .  Skin: warm and dry, no rash Neuro:  Strength and sensation are intact Psych: euthymic mood, full affect   EKG:  EKG is not ordered today.    Recent Labs: 04/06/2014: Magnesium 1.8 05/15/2014: TSH 1.780 09/02/2014: ALT 6; Hemoglobin 10.5*; Platelets 310 09/04/2014: BUN 18; Creatinine 1.09; Potassium 4.7; Sodium 138    Lipid Panel    Component Value Date/Time   CHOL 175 09/30/2012 0935   TRIG 130.0 09/30/2012 0935   HDL 69.30 09/30/2012 0935   CHOLHDL 3 09/30/2012 0935   VLDL 26.0 09/30/2012 0935   LDLCALC 80 09/30/2012 0935   LDLDIRECT 164.2 03/22/2011 1000      Wt Readings from Last 3 Encounters:  11/17/14 151 lb 12.8 oz (68.856 kg)  10/06/14 151 lb (68.493 kg)  10/05/14 148 lb (67.132 kg)     Other studies Reviewed: Additional studies/ records that were reviewed today include: . Review of the above records demonstrates:    ASSESSMENT AND PLAN:  1. Paroxysmal atrial fib ablation: The patient is back in atrial fib and has a rapid rate today. She's off the diltiazem. We will start her on metoprolol 25 g twice a day to see if this helps closer rate.  We tried her on Xarelto in the past but she had significant BI bleeding.  We discussed starting Eliquis.  She needs to see her GI doctor to see if this will be OK.  2. Aortic stenosis:  Mild , stable  3. HTN:  Will start metoprolol 25 bid.     I will see her in 3 months.   Current medicines are reviewed at length with the patient today.  The patient does not have concerns regarding medicines.  The following changes have been made:  Add metoprolol 25 mg BID   Labs/ tests ordered today include:    No orders of the defined types were placed in this encounter.    Disposition:   FU with me  in 3 months  Signed, Nahser, Wonda Cheng, MD  11/17/2014 11:28 AM    [facility] [address], [place], [place]  [address] Phone: [phone]; Fax: [phone]

## 2014-11-25 DIAGNOSIS — L97909 Non-pressure chronic ulcer of unspecified part of unspecified lower leg with unspecified severity: Secondary | ICD-10-CM | POA: Diagnosis not present

## 2014-11-25 DIAGNOSIS — R5383 Other fatigue: Secondary | ICD-10-CM | POA: Diagnosis not present

## 2014-11-25 DIAGNOSIS — R0602 Shortness of breath: Secondary | ICD-10-CM | POA: Diagnosis not present

## 2014-11-25 DIAGNOSIS — L982 Febrile neutrophilic dermatosis [Sweet]: Secondary | ICD-10-CM | POA: Diagnosis not present

## 2014-11-25 DIAGNOSIS — M0589 Other rheumatoid arthritis with rheumatoid factor of multiple sites: Secondary | ICD-10-CM | POA: Diagnosis not present

## 2014-11-30 ENCOUNTER — Encounter: Payer: Self-pay | Admitting: Internal Medicine

## 2014-11-30 DIAGNOSIS — D509 Iron deficiency anemia, unspecified: Secondary | ICD-10-CM | POA: Diagnosis not present

## 2014-11-30 DIAGNOSIS — K449 Diaphragmatic hernia without obstruction or gangrene: Secondary | ICD-10-CM | POA: Diagnosis not present

## 2014-11-30 DIAGNOSIS — Z6829 Body mass index (BMI) 29.0-29.9, adult: Secondary | ICD-10-CM | POA: Diagnosis not present

## 2014-11-30 DIAGNOSIS — I10 Essential (primary) hypertension: Secondary | ICD-10-CM | POA: Diagnosis not present

## 2014-11-30 DIAGNOSIS — K589 Irritable bowel syndrome without diarrhea: Secondary | ICD-10-CM | POA: Diagnosis not present

## 2014-11-30 DIAGNOSIS — I480 Paroxysmal atrial fibrillation: Secondary | ICD-10-CM | POA: Diagnosis not present

## 2014-11-30 DIAGNOSIS — R0600 Dyspnea, unspecified: Secondary | ICD-10-CM | POA: Diagnosis not present

## 2014-12-04 ENCOUNTER — Encounter: Payer: Self-pay | Admitting: *Deleted

## 2014-12-09 ENCOUNTER — Ambulatory Visit (INDEPENDENT_AMBULATORY_CARE_PROVIDER_SITE_OTHER): Payer: Medicare Other | Admitting: Internal Medicine

## 2014-12-09 ENCOUNTER — Encounter: Payer: Self-pay | Admitting: Internal Medicine

## 2014-12-09 VITALS — BP 120/78 | HR 68 | Ht 60.0 in | Wt 152.6 lb

## 2014-12-09 DIAGNOSIS — I482 Chronic atrial fibrillation, unspecified: Secondary | ICD-10-CM

## 2014-12-09 DIAGNOSIS — R1013 Epigastric pain: Secondary | ICD-10-CM | POA: Diagnosis not present

## 2014-12-09 DIAGNOSIS — K449 Diaphragmatic hernia without obstruction or gangrene: Secondary | ICD-10-CM | POA: Diagnosis not present

## 2014-12-09 DIAGNOSIS — K625 Hemorrhage of anus and rectum: Secondary | ICD-10-CM | POA: Diagnosis not present

## 2014-12-09 NOTE — Progress Notes (Signed)
Patient ID: [name], female   DOB: 17-Nov-1926, 81 y.o.   MRN: [id number] HPI: [name] is an 81 yo female with PMH of aortic stenosis, atrial fibrillation, rheumatoid arthritis, varicose veins, hypertension, hyperlipidemia and hypothyroidism who is seen today in consultation at the request of Dr. Acie Fredrickson to evaluate an episode of rectal bleeding.  She is here alone today.  She reports that overall she is feeling well. She is having postprandial abdominal pressure and occasional pain. All of this is exacerbated by eating. He can be associated with nausea but not vomiting. She reports feeling like she needs to "push down" her upper abdomen in order to feel better. She reports an overall good appetite and she denies weight loss. She feels her symptoms are related to a hiatal hernia. She reports being told about her hiatal hernia after a previous chest x-ray. She reports regular bowel movements occurring 2-3 times per day. She denies rectal bleeding or melena. Though, she did have an aunt isolated episode of rectal bleeding which occurred several months ago after starting Xarelto for her A. Fib. The medication was discontinued secondary to this rectal bleeding and has not been restarted. She reports not wanting to resume anticoagulation therapy. She sees Dr. Lenna Gilford with rheumatology for her osteoarthritis. She took Remicade for some time but also after this developed bilateral lower extremity infection which she feels is related. It also sounds like she may have had an infusion reaction. She has never had endoscopic procedure and wishes to avoid them. She denies a family history of colorectal cancer   Past Medical History  Diagnosis Date  . HTN (hypertension)   . HLD (hyperlipidemia)   . Hypothyroidism   . Aortic stenosis, moderate     last echo in 2010; AVA 1.1; mild AS per echo in June 2013  . Edema of both legs     s/p laser treatment per Dr. Donnetta Hutching  . A-fib   . Hiatal hernia   .  Rheumatoid arthritis   . IBS (irritable bowel syndrome)     Past Surgical History  Procedure Laterality Date  . Appendectomy  1955  . Foot surgery  2003    bilateral Hammer Toe  . Eye surgery  2005    Bilateral cataract  . Endovenous ablation saphenous vein w/ laser  08-29-2012    left greater saphenous vein   Curt Jews MD  . Endovenous ablation saphenous vein w/ laser  09-19-2012    right greater saphenous vein by Curt Jews MD  . Stab phlebectomy Right 01-02-2013    10-15 incisions right thigh and calf by Curt Jews MD  . Carpal tunnel release Right   . Cataract extraction    . Endoscopic vein laser treatment      Outpatient Prescriptions Prior to Visit  Medication Sig Dispense Refill  . acetaminophen (TYLENOL) 500 MG tablet Take 1,000 mg by mouth every 6 (six) hours as needed (pain).     Marland Kitchen aspirin 81 MG chewable tablet Chew 81 mg by mouth daily.    . calcium carbonate (OS-CAL) 600 MG TABS Take 1,800 mg by mouth daily.    . cyanocobalamin 500 MCG tablet Take 500 mcg by mouth daily.    . diclofenac sodium (VOLTAREN) 1 % GEL Apply 2 g topically 4 (four) times daily as needed (For pain).    Marland Kitchen dicyclomine (BENTYL) 20 MG tablet Take 10 mg by mouth every 6 (six) hours as needed for spasms. For stomach irritability.    Marland Kitchen  estrogens, conjugated, (PREMARIN) 0.625 MG tablet Take 0.625 mg by mouth daily.     Marland Kitchen levothyroxine (SYNTHROID, LEVOTHROID) 75 MCG tablet Take 75 mcg by mouth daily.      Marland Kitchen losartan-hydrochlorothiazide (HYZAAR) 100-12.5 MG per tablet Take 1 tablet by mouth daily.    . medroxyPROGESTERone (PROVERA) 2.5 MG tablet Take 5 mg by mouth as directed. For 10 days each month 20th-30th    . olopatadine (PATANOL) 0.1 % ophthalmic solution Place 1 drop into both eyes 2 (two) times daily.    . Omega-3 Fatty Acids (FISH OIL) 1000 MG CAPS Take 1 capsule by mouth daily.     . vitamin E 400 UNIT capsule Take 400 Units by mouth daily.    . metoprolol tartrate (LOPRESSOR) 25 MG tablet  Take 1 tablet (25 mg total) by mouth 2 (two) times daily. 62 tablet 11   No facility-administered medications prior to visit.    Allergies  Allergen Reactions  . Aspirin Nausea And Vomiting    Low Dose is ok  . Codeine Nausea And Vomiting    "Deathly Sick"  . Coreg [Carvedilol] Other (See Comments)    Fatigue/ ill  . Penicillins Rash    Family History  Problem Relation Age of Onset  . Peripheral vascular disease    . Heart disease Mother   . Peripheral vascular disease Mother   . Heart disease Father   . Peripheral vascular disease Father     Right leg amputation  . Hypertension Sister   . Heart disease Brother 70    Heart Disease before age 71    History  Substance Use Topics  . Smoking status: Former Smoker    Types: Cigarettes    Quit date: 10/31/1983  . Smokeless tobacco: Never Used  . Alcohol Use: No    ROS: As per history of present illness, otherwise negative  BP 120/78 mmHg  Pulse 68  Ht 5' (1.524 m)  Wt 152 lb 9.6 oz (69.219 kg)  BMI 29.80 kg/m2  SpO2 99% Constitutional: Well-developed and well-nourished. No distress. HEENT: Normocephalic and atraumatic. Oropharynx is clear and moist. No oropharyngeal exudate. Conjunctivae are normal.  No scleral icterus. Neck: Neck supple. Trachea midline. Cardiovascular: Irregularly irregular. 2/6 sem Pulmonary/chest: Effort normal and breath sounds normal. No wheezing, rales or rhonchi. Abdominal: Soft, diffusely tender without rebound or guarding, nondistended. Bowel sounds active throughout. There are no masses palpable.  Extremities: no clubbing, cyanosis, bilateral 1+ edema with changes of venous stasis Neurological: Alert and oriented to person place and time. Skin: Skin is warm and dry. No rashes noted. Psychiatric: Normal mood and affect. Behavior is normal.  RELEVANT LABS AND IMAGING: CBC    Component Value Date/Time   WBC 8.0 09/02/2014 0440   RBC 3.62* 09/02/2014 0440   RBC 3.75* 04/02/2014 1228    HGB 10.5* 09/02/2014 0440   HCT 32.1* 09/02/2014 0440   PLT 310 09/02/2014 0440   MCV 88.7 09/02/2014 0440   MCH 29.0 09/02/2014 0440   MCHC 32.7 09/02/2014 0440   RDW 13.3 09/02/2014 0440   LYMPHSABS 1.9 09/02/2014 0440   MONOABS 0.6 09/02/2014 0440   EOSABS 0.4 09/02/2014 0440   BASOSABS 0.0 09/02/2014 0440    CMP     Component Value Date/Time   NA 138 09/04/2014 0540   K 4.7 09/04/2014 0540   CL 98 09/04/2014 0540   CO2 24 09/04/2014 0540   GLUCOSE 97 09/04/2014 0540   BUN 18 09/04/2014 0540   CREATININE 1.09  09/04/2014 0540   CALCIUM 9.5 09/04/2014 0540   PROT 6.7 09/02/2014 0440   ALBUMIN 2.7* 09/02/2014 0440   AST 15 09/02/2014 0440   ALT 6 09/02/2014 0440   ALKPHOS 69 09/02/2014 0440   BILITOT 0.2* 09/02/2014 0440   GFRNONAA 45* 09/04/2014 0540   GFRAA 52* 09/04/2014 0540   Iron/TIBC/Ferritin/ %Sat    Component Value Date/Time   IRON 16* 04/02/2014 1228   TIBC 276 04/02/2014 1228   FERRITIN 72 04/02/2014 1228   IRONPCTSAT 6* 04/02/2014 1228    ASSESSMENT/PLAN: 81 yo female with PMH of aortic stenosis, atrial fibrillation, rheumatoid arthritis, varicose veins, hypertension, hyperlipidemia and hypothyroidism who is seen today in consultation at the request of Dr. Acie Fredrickson to evaluate an episode of rectal bleeding.  1. Rectal bleeding in setting of Xarelto/anemia -- anemia is normocytic though her iron percent saturation was low in June 2015 with normal ferritin. This is somewhat a mixed picture and possibly that of chronic disease. She had an episode of rectal bleeding which she reports occurred only once while taking anticoagulation or her atrial fibrillation. Dr. Acie Fredrickson would like to resume anticoagulation for A. fib and we have discussed this at length today. The patient reports she remains hesitant to resume anticoagulation therapy. We discussed colonoscopy at length given her previous rectal bleeding and her anemia, though I'm not convinced this is purely iron  deficiency. She would like to avoid colonoscopy even after our discussion that colonoscopy would likely be necessary before restarting anticoagulation and that we may be missing a bleeding lesion such as a polyp or tumor. She would prefer to discuss this again with Dr. Acie Fredrickson.  She has an appointment with him in April and I will see her thereafter. I would be willing to proceed with colonoscopy should she change her mind. Another option would be resuming anticoagulant therapy with close monitoring and seeing if rectal bleeding recurs.  2. Epigastric pressure, intermittent pain/hiatal hernia -- hiatal hernia large by x-ray. We discussed this at length and how there is no great medical therapy for hiatal hernias. The repair is usually surgical, which she would like to avoid. At her age and with her medical issues this is certainly understandable. I recommended a upper GI series to fully assess the size and nature of this hernia. PPI was started by primary care though is no longer on her med list. We discussed this today, it is unclear to me if she is still taking this medication.    MP:NTIRWE J Nahser, Md [address] [place], [place] [address]

## 2014-12-09 NOTE — Patient Instructions (Signed)
You have been scheduled for a Barium Esophogram and upper GI series at [facility] (1st floor of the hospital) on Monday 12/21/14 at 9:30 am. Please arrive 15 minutes prior to your appointment for registration. Make certain not to have anything to eat or drink 6 hours prior to your test. If you need to reschedule for any reason, please contact radiology at [phone] to do so. __________________________________________________________________ A barium swallow is an examination that concentrates on views of the esophagus. This tends to be a double contrast exam (barium and two liquids which, when combined, create a gas to distend the wall of the oesophagus) or single contrast (non-ionic iodine based). The study is usually tailored to your symptoms so a good history is essential. Attention is paid during the study to the form, structure and configuration of the esophagus, looking for functional disorders (such as aspiration, dysphagia, achalasia, motility and reflux) EXAMINATION You may be asked to change into a gown, depending on the type of swallow being performed. A radiologist and radiographer will perform the procedure. The radiologist will advise you of the type of contrast selected for your procedure and direct you during the exam. You will be asked to stand, sit or lie in several different positions and to hold a small amount of fluid in your mouth before being asked to swallow while the imaging is performed .In some instances you may be asked to swallow barium coated marshmallows to assess the motility of a solid food bolus. The exam can be recorded as a digital or video fluoroscopy procedure. POST PROCEDURE It will take 1-2 days for the barium to pass through your system. To facilitate this, it is important, unless otherwise directed, to increase your fluids for the next 24-48hrs and to resume your normal diet.  This test typically takes about 30 minutes to  perform. __________________________________________________________________________________  [name] have been scheduled to see Dr Hilarie Fredrickson for follow up on Monday, 02/22/15 @ 9:30 am.  CC:Dr Holwerda

## 2014-12-17 ENCOUNTER — Encounter: Payer: Self-pay | Admitting: Internal Medicine

## 2014-12-21 ENCOUNTER — Other Ambulatory Visit (HOSPITAL_COMMUNITY): Payer: Medicare Other

## 2014-12-21 ENCOUNTER — Ambulatory Visit (HOSPITAL_COMMUNITY): Payer: Medicare Other

## 2014-12-28 ENCOUNTER — Telehealth: Payer: Self-pay | Admitting: Internal Medicine

## 2014-12-28 ENCOUNTER — Ambulatory Visit (HOSPITAL_COMMUNITY)
Admission: RE | Admit: 2014-12-28 | Discharge: 2014-12-28 | Disposition: A | Discharge status: Home / Self Care | Payer: Medicare Other | Source: Ambulatory Visit | Attending: Internal Medicine | Admitting: Internal Medicine

## 2014-12-28 DIAGNOSIS — R0602 Shortness of breath: Secondary | ICD-10-CM | POA: Diagnosis present

## 2014-12-28 DIAGNOSIS — K219 Gastro-esophageal reflux disease without esophagitis: Secondary | ICD-10-CM | POA: Diagnosis not present

## 2014-12-28 DIAGNOSIS — K449 Diaphragmatic hernia without obstruction or gangrene: Secondary | ICD-10-CM

## 2014-12-28 NOTE — Telephone Encounter (Signed)
Dr Hilarie Fredrickson will first review the UGI as time permits and will then advise the appropriate steps to take from there. Someone will contact patient after this has taken place.

## 2014-12-29 DIAGNOSIS — M25562 Pain in left knee: Secondary | ICD-10-CM | POA: Diagnosis not present

## 2014-12-31 ENCOUNTER — Telehealth: Payer: Self-pay

## 2014-12-31 NOTE — Telephone Encounter (Signed)
-----   Message from Jerene Bears, MD sent at 12/28/2014  2:50 PM EST ----- Regarding: results Barium swallow/UGI result note --Large hiatal hernia with the majority of the stomach above the diaphragm and thus located in the chest --Esophageal motility appears normal for age --She does have reflux which is not surprising with large hiatal hernia --No evidence for esophageal stricture or mass/barium tablet passes freely to the stomach --No evidence were other stomach or proximal small bowel abnormality --Upper abdominal pressure as discussed in clinic is likely secondary to large symptomatically hiatal hernia --Surgical repair is the only option to provide meaningful benefit, but per our discussion in clinic she would like to avoid surgery which is understandable. If she has changed her mind or would like a surgical opinion she can be referred to [facility] --Please CC: her PCP

## 2014-12-31 NOTE — Telephone Encounter (Signed)
Spoke with pt and she is aware. Pt does not want to see surgeon at this time. Will keep her OV that is already scheduled with Dr. Hilarie Fredrickson.

## 2015-01-04 DIAGNOSIS — I498 Other specified cardiac arrhythmias: Secondary | ICD-10-CM | POA: Diagnosis not present

## 2015-01-04 DIAGNOSIS — L03129 Acute lymphangitis of unspecified part of limb: Secondary | ICD-10-CM | POA: Diagnosis not present

## 2015-01-04 DIAGNOSIS — K449 Diaphragmatic hernia without obstruction or gangrene: Secondary | ICD-10-CM | POA: Diagnosis not present

## 2015-01-04 DIAGNOSIS — K219 Gastro-esophageal reflux disease without esophagitis: Secondary | ICD-10-CM | POA: Diagnosis not present

## 2015-02-16 ENCOUNTER — Ambulatory Visit: Payer: Medicare Other | Admitting: Cardiovascular Disease

## 2015-02-22 ENCOUNTER — Ambulatory Visit (INDEPENDENT_AMBULATORY_CARE_PROVIDER_SITE_OTHER): Payer: Medicare Other | Admitting: Internal Medicine

## 2015-02-22 ENCOUNTER — Encounter: Payer: Self-pay | Admitting: Internal Medicine

## 2015-02-22 VITALS — BP 138/60 | HR 72 | Ht 60.0 in | Wt 155.2 lb

## 2015-02-22 DIAGNOSIS — K449 Diaphragmatic hernia without obstruction or gangrene: Secondary | ICD-10-CM

## 2015-02-22 DIAGNOSIS — K625 Hemorrhage of anus and rectum: Secondary | ICD-10-CM

## 2015-02-22 NOTE — Patient Instructions (Signed)
Please follow up with Dr Pyrtle as needed. 

## 2015-02-22 NOTE — Progress Notes (Signed)
Subjective:    Patient ID: [name], female    DOB: October 12, 1927, 81 y.o.   MRN: [id number]  HPI [name] is an 81 year old female with a past medical history of aortic stenosis, atrial fibrillation, rheumatoid arthritis, varicose veins, hypertension, hyperlipidemia, hypothyroidism, and large hiatal hernia who is seen in follow-up. She was initially seen in February 2016 and returns today with her son present. At her last visit she had had a single episode of isolated rectal bleeding in setting of anticoagulation for atrial fibrillation. She also is having upper abdominal fullness and pressure. We discussed colonoscopy which she wished to avoid and she was going to discuss anticoagulation with her cardiologist. She also had an upper GI series to evaluate her upper abdominal pressure and fullness.  She reports she did not return to see her cardiologist because she knew he would recommend anticoagulation. She does not want anticoagulation. She does take a baby aspirin daily. She's had no further episodes of rectal bleeding, hematochezia or melena. Bowel movements are regular occurring 2-3 times a day and are soft, formed and brown. No diarrhea or constipation. No lower abdominal pain.  She did have an upper GI series which showed a large hiatal hernia and mild reflux. She does continue to have issues with upper abdominal pressure particularly after eating. There is mild early satiety. She is eating smaller more frequent meals and this helps. She also reports improvement in symptoms if she walks around for a bit after eating. Occasionally after eating she feels mild shortness of breath which is transient. She denies dysphagia or odynophagia. No nausea or vomiting.  Review of Systems As per history of present illness, otherwise negative  Current Medications, Allergies, Past Medical History, Past Surgical History, Family History and Social History were reviewed in [facility] record.     Objective:   Physical Exam BP 138/60 mmHg  Pulse 72  Ht 5' (1.524 m)  Wt 155 lb 4 oz (70.421 kg)  BMI 30.32 kg/m2 Constitutional: Well-developed and well-nourished. No distress. HEENT: Normocephalic and atraumatic. Conjunctivae are normal.  No scleral icterus. Neck: Neck supple. Trachea midline. Cardiovascular: Irregular with 3/6 systolic ejection murmur Pulmonary/chest: Effort normal and breath sounds normal. No wheezing, rales or rhonchi. Abdominal: Soft, nontender, nondistended. Bowel sounds active throughout.  Extremities: no clubbing, cyanosis, trace pretib edema with b/l varicosities Neurological: Alert and oriented to person place and time. Skin: Skin is warm and dry. No rashes noted. Psychiatric: Normal mood and affect. Behavior is normal.  EXAM: UPPER GI SERIES WITH KUB   TECHNIQUE: After obtaining a scout radiograph a routine upper GI series was performed using thin and high density barium.   FLUOROSCOPY TIME:  Radiation Exposure Index (as provided by the fluoroscopic device):   If the device does not provide the exposure index:   Fluoroscopy Time (in minutes and seconds): 1 minutes and 48 seconds.   Number of Acquired Images: 1 scout image. Two non fluoro-store series (18 images total).   COMPARISON:  Chest radiographs 11/25/2014 and 07/02/2014.   FINDINGS: The scout abdominal radiograph demonstrates a nonobstructive bowel gas pattern. There is stool throughout the colon and mild lumbar spondylosis.   The patient swallowed the contrast without difficulty. The esophageal motility is within normal limits for age. There is a large hiatal hernia with the majority of the stomach above the diaphragm. Contrast passed freely through the diaphragmatic hiatus into the distal stomach and small bowel. There is no evidence of gastric  wall thickening or erosion. The proximal small bowel appears normal.   Mild reflux was elicited with  the water siphon test. A 13 mm barium tablet was administered and passed without delay into the stomach.   IMPRESSION: 1. Large hiatal hernia without evidence of distal esophagitis or stricture. 2. Mild gastroesophageal reflux.     Electronically Signed   By: Richardean Sale M.D.   On: 12/28/2014 11:33     Assessment & Plan:  81 year old female with a past medical history of aortic stenosis, atrial fibrillation, rheumatoid arthritis, varicose veins, hypertension, hyperlipidemia, hypothyroidism, and large hiatal hernia who is seen in follow-up.  1. Symptomatic, large hiatal hernia -- her upper abdominal fullness, early satiety and postprandial shortness of breath are felt to relate to her large hiatal hernia. She manages and feels that the symptoms well. It is not limiting her activity. We discussed management which includes small frequent meals, not overeating, remaining upright after eating for at least 2 hours. Definitive treatment would be surgical and we discussed this at length today with both she and her son. She does not wish to undergo surgical evaluation or surgery, which I understand and agree with, particularly in light of her aortic stenosis. If symptoms worsen asked that she notify me and she voices understanding. She is happy with this plan  2. Rectal bleeding -- isolated episode which has not recurred. Initially occurred in the setting of anticoagulation. She has made the decision, by not returning to cardiology, not to resume anticoagulation. We again discussed colonoscopy and she does not wish to proceed with this exam. She understands that we are not clear as to what bled several months ago, but fortunately it hasn't recurred. She is asked and agrees to notify me of rectal bleeding returns  Return as needed 25 minutes spent today with the patient, greater than 50% counseling on the above issues

## 2015-02-26 DIAGNOSIS — H16223 Keratoconjunctivitis sicca, not specified as Sjogren's, bilateral: Secondary | ICD-10-CM | POA: Diagnosis not present

## 2015-02-26 DIAGNOSIS — H3531 Nonexudative age-related macular degeneration: Secondary | ICD-10-CM | POA: Diagnosis not present

## 2015-02-26 DIAGNOSIS — H02834 Dermatochalasis of left upper eyelid: Secondary | ICD-10-CM | POA: Diagnosis not present

## 2015-02-26 DIAGNOSIS — H04123 Dry eye syndrome of bilateral lacrimal glands: Secondary | ICD-10-CM | POA: Diagnosis not present

## 2015-02-28 DIAGNOSIS — H3531 Nonexudative age-related macular degeneration: Secondary | ICD-10-CM | POA: Diagnosis not present

## 2015-03-11 ENCOUNTER — Telehealth: Payer: Self-pay | Admitting: *Deleted

## 2015-03-11 DIAGNOSIS — E039 Hypothyroidism, unspecified: Secondary | ICD-10-CM | POA: Diagnosis not present

## 2015-03-11 DIAGNOSIS — I10 Essential (primary) hypertension: Secondary | ICD-10-CM | POA: Diagnosis not present

## 2015-03-11 NOTE — Telephone Encounter (Signed)
Patient calling, asking for an appointment ASAP.  She states that the pain is back in her legs and she needs to see Dr. Tommy Medal.  RN offered the next available appointment 5/19, but patient felt this was too far due to the extreme pain.  Please advise. Landis Gandy, RN

## 2015-03-12 DIAGNOSIS — S8010XA Contusion of unspecified lower leg, initial encounter: Secondary | ICD-10-CM | POA: Diagnosis not present

## 2015-03-12 DIAGNOSIS — I4891 Unspecified atrial fibrillation: Secondary | ICD-10-CM | POA: Diagnosis not present

## 2015-03-12 DIAGNOSIS — E039 Hypothyroidism, unspecified: Secondary | ICD-10-CM | POA: Diagnosis not present

## 2015-03-12 DIAGNOSIS — S8011XA Contusion of right lower leg, initial encounter: Secondary | ICD-10-CM | POA: Diagnosis not present

## 2015-03-12 DIAGNOSIS — I10 Essential (primary) hypertension: Secondary | ICD-10-CM | POA: Diagnosis not present

## 2015-03-15 NOTE — Telephone Encounter (Signed)
Patient saw her PCP, was given 50 mg prednisone for 5 days until Wednesday when she will see her PCP for a taper.  Patient does not wish to follow up with Dr. Trudie Reed, as she doesn't feel comfortable with how she communicated risk/benefits of Remicade.

## 2015-03-15 NOTE — Telephone Encounter (Signed)
She had what appeared to be pyoderma gangrenosum which needs steroids and not antibiotics. It was the steroids I gave her that made her better and I would think she needs more steroids or other immunomodulatory drugs rather than antibiotics--which I stopped. She had seen Gavin Pound for this from Rheumatology

## 2015-03-17 DIAGNOSIS — I10 Essential (primary) hypertension: Secondary | ICD-10-CM | POA: Diagnosis not present

## 2015-03-17 DIAGNOSIS — S71009D Unspecified open wound, unspecified hip, subsequent encounter: Secondary | ICD-10-CM | POA: Diagnosis not present

## 2015-03-17 DIAGNOSIS — L03115 Cellulitis of right lower limb: Secondary | ICD-10-CM | POA: Diagnosis not present

## 2015-03-17 DIAGNOSIS — F411 Generalized anxiety disorder: Secondary | ICD-10-CM | POA: Diagnosis not present

## 2015-03-24 DIAGNOSIS — I10 Essential (primary) hypertension: Secondary | ICD-10-CM | POA: Diagnosis not present

## 2015-03-24 DIAGNOSIS — F411 Generalized anxiety disorder: Secondary | ICD-10-CM | POA: Diagnosis not present

## 2015-03-24 DIAGNOSIS — R6889 Other general symptoms and signs: Secondary | ICD-10-CM | POA: Diagnosis not present

## 2015-04-12 DIAGNOSIS — M170 Bilateral primary osteoarthritis of knee: Secondary | ICD-10-CM | POA: Diagnosis not present

## 2015-04-12 DIAGNOSIS — M19041 Primary osteoarthritis, right hand: Secondary | ICD-10-CM | POA: Diagnosis not present

## 2015-04-12 DIAGNOSIS — Z09 Encounter for follow-up examination after completed treatment for conditions other than malignant neoplasm: Secondary | ICD-10-CM | POA: Diagnosis not present

## 2015-04-12 DIAGNOSIS — M0579 Rheumatoid arthritis with rheumatoid factor of multiple sites without organ or systems involvement: Secondary | ICD-10-CM | POA: Diagnosis not present

## 2015-04-14 DIAGNOSIS — Z79899 Other long term (current) drug therapy: Secondary | ICD-10-CM | POA: Diagnosis not present

## 2015-04-14 DIAGNOSIS — Z9225 Personal history of immunosupression therapy: Secondary | ICD-10-CM | POA: Diagnosis not present

## 2015-04-14 DIAGNOSIS — M2550 Pain in unspecified joint: Secondary | ICD-10-CM | POA: Diagnosis not present

## 2015-04-27 DIAGNOSIS — L88 Pyoderma gangrenosum: Secondary | ICD-10-CM | POA: Diagnosis not present

## 2015-04-27 DIAGNOSIS — M170 Bilateral primary osteoarthritis of knee: Secondary | ICD-10-CM | POA: Diagnosis not present

## 2015-04-27 DIAGNOSIS — M0579 Rheumatoid arthritis with rheumatoid factor of multiple sites without organ or systems involvement: Secondary | ICD-10-CM | POA: Diagnosis not present

## 2015-04-27 DIAGNOSIS — Z09 Encounter for follow-up examination after completed treatment for conditions other than malignant neoplasm: Secondary | ICD-10-CM | POA: Diagnosis not present

## 2015-05-11 ENCOUNTER — Ambulatory Visit: Payer: Medicare Other | Admitting: Infectious Disease

## 2015-06-09 DIAGNOSIS — R0600 Dyspnea, unspecified: Secondary | ICD-10-CM | POA: Diagnosis not present

## 2015-06-09 DIAGNOSIS — I10 Essential (primary) hypertension: Secondary | ICD-10-CM | POA: Diagnosis not present

## 2015-06-09 DIAGNOSIS — F411 Generalized anxiety disorder: Secondary | ICD-10-CM | POA: Diagnosis not present

## 2015-06-09 DIAGNOSIS — I4891 Unspecified atrial fibrillation: Secondary | ICD-10-CM | POA: Diagnosis not present

## 2015-06-14 DIAGNOSIS — I509 Heart failure, unspecified: Secondary | ICD-10-CM | POA: Diagnosis not present

## 2015-06-14 DIAGNOSIS — R0600 Dyspnea, unspecified: Secondary | ICD-10-CM | POA: Diagnosis not present

## 2015-06-14 DIAGNOSIS — I4891 Unspecified atrial fibrillation: Secondary | ICD-10-CM | POA: Diagnosis not present

## 2015-06-16 DIAGNOSIS — I4891 Unspecified atrial fibrillation: Secondary | ICD-10-CM | POA: Diagnosis not present

## 2015-06-16 DIAGNOSIS — I10 Essential (primary) hypertension: Secondary | ICD-10-CM | POA: Diagnosis not present

## 2015-06-16 DIAGNOSIS — R0602 Shortness of breath: Secondary | ICD-10-CM | POA: Diagnosis not present

## 2015-06-16 DIAGNOSIS — I509 Heart failure, unspecified: Secondary | ICD-10-CM | POA: Diagnosis not present

## 2015-06-22 ENCOUNTER — Ambulatory Visit (INDEPENDENT_AMBULATORY_CARE_PROVIDER_SITE_OTHER): Payer: Medicare Other | Admitting: Physician Assistant

## 2015-06-22 ENCOUNTER — Encounter: Payer: Self-pay | Admitting: Physician Assistant

## 2015-06-22 VITALS — BP 130/72 | HR 95 | Ht 61.0 in | Wt 155.8 lb

## 2015-06-22 DIAGNOSIS — I10 Essential (primary) hypertension: Secondary | ICD-10-CM | POA: Diagnosis not present

## 2015-06-22 DIAGNOSIS — R0609 Other forms of dyspnea: Secondary | ICD-10-CM

## 2015-06-22 DIAGNOSIS — I482 Chronic atrial fibrillation, unspecified: Secondary | ICD-10-CM

## 2015-06-22 DIAGNOSIS — R0602 Shortness of breath: Secondary | ICD-10-CM

## 2015-06-22 MED ORDER — NEBIVOLOL HCL 5 MG PO TABS: 5.0000 mg | ORAL_TABLET | Freq: Every day | ORAL | Status: DC

## 2015-06-22 NOTE — Addendum Note (Signed)
Addended by: Claude Manges on: 06/22/2015 04:57 PM   Modules accepted: Orders

## 2015-06-22 NOTE — Patient Instructions (Signed)
Medication Instructions:   Your physician recommends that you continue on your current medications as directed. Please refer to the Current Medication list given to you today.    Labwork:  NONE ORDER TODAY   Testing/Procedures:  Your physician has requested that you have a lexiscan myoview. BEFORE 06/29/15 .Marland KitchenMarland KitchenFor further information please visit [email or web address]. Please follow instruction sheet, as given.   Follow-Up:  ALREADY SCHEDULED FOR 06/29/15 WITH DR Acie Fredrickson    Any Other Special Instructions Will Be Listed Below (If Applicable).

## 2015-06-22 NOTE — Progress Notes (Signed)
Patient ID: [name], female   DOB: [birth year], 81 y.o.   MRN: [id number]    Date:  06/22/2015   ID:  [name], DOB 1927/05/14, MRN [id number]  PCP:  Rachell Cipro, MD  Primary Cardiologist:  Nahser  No chief complaint on file.    History of Present Illness: [name] is a 81 y.o. female who has a history of paroxysmal atrial fibrillation, hypertension, hypothyroidism. She's previously been on Cardizem but was ultimately discontinued.  We tried her on Xarelto about a year ago. She started passing blood in her stool. The Xarelto was discontinued.  She continues to have problems with arthritis. She has tried predinisone and Remicaid. She had lots of issues related to the Remicaid and discontinued its use in January.  Since January the patient's complained of dyspnea with exertion. She is not short of breath at rest. And otherwise denies nausea, vomiting, fever, chest pain, orthopnea, dizziness, PND, cough, congestion, abdominal pain, hematochezia, melena, lower extremity edema, claudication.  Her weight has been stable since April.   Wt Readings from Last 3 Encounters:  06/22/15 155 lb 12.8 oz (70.67 kg)  02/22/15 155 lb 4 oz (70.421 kg)  12/09/14 152 lb 9.6 oz (69.219 kg)     Past Medical History  Diagnosis Date  . HTN (hypertension)   . HLD (hyperlipidemia)   . Hypothyroidism   . Aortic stenosis, moderate     last echo in 2010; AVA 1.1; mild AS per echo in June 2013  . Edema of both legs     s/p laser treatment per Dr. Donnetta Hutching  . A-fib   . Hiatal hernia   . Iron deficiency anemia   . Rheumatoid arthritis   . IBS (irritable bowel syndrome)     Current Outpatient Prescriptions  Medication Sig Dispense Refill  . acetaminophen (TYLENOL) 500 MG tablet Take 1,000 mg by mouth every 6 (six) hours as needed (pain).     Marland Kitchen aspirin 81 MG chewable tablet Chew 81 mg by mouth daily.    Marland Kitchen BENICAR 40 MG tablet Take 40 mg by mouth daily.    . calcium  carbonate (OS-CAL) 600 MG TABS Take 1,800 mg by mouth daily.    . cyanocobalamin 500 MCG tablet Take 500 mcg by mouth daily.    . diclofenac sodium (VOLTAREN) 1 % GEL Apply 2 g topically 4 (four) times daily as needed (For pain).    Marland Kitchen dicyclomine (BENTYL) 20 MG tablet Take 10 mg by mouth every 6 (six) hours as needed for spasms. For stomach irritability.    Marland Kitchen estrogens, conjugated, (PREMARIN) 0.625 MG tablet Take 0.625 mg by mouth daily.     . furosemide (LASIX) 20 MG tablet Take 20 mg by mouth daily.    Marland Kitchen levothyroxine (SYNTHROID, LEVOTHROID) 75 MCG tablet Take 75 mcg by mouth daily.      . medroxyPROGESTERone (PROVERA) 2.5 MG tablet Take 5 mg by mouth as directed. For 10 days each month 20th-30th    . olopatadine (PATANOL) 0.1 % ophthalmic solution Place 1 drop into both eyes 2 (two) times daily.    . Omega-3 Fatty Acids (FISH OIL) 1000 MG CAPS Take 1 capsule by mouth daily.     . RESTASIS 0.05 % ophthalmic emulsion Place 1 drop into both eyes 2 (two) times daily.    . vitamin E 400 UNIT capsule Take 400 Units by mouth daily.     No current facility-administered medications for this visit.    Allergies:  Allergies  Allergen Reactions  . Remicade [Infliximab] Other (See Comments)    REACTION: "Enlarged intestines and hernia. Also pushed intestines on lower part of lungs."  . Aspirin Nausea And Vomiting    Low Dose is ok  . Codeine Nausea And Vomiting    "Deathly Sick"  . Coreg [Carvedilol] Other (See Comments)    Fatigue/ ill  . Penicillins Rash    Social History:  The patient  reports that she quit smoking about 31 years ago. Her smoking use included Cigarettes. She has never used smokeless tobacco. She reports that she does not drink alcohol or use illicit drugs.   Family history:   Family History  Problem Relation Age of Onset  . Peripheral vascular disease    . Heart disease Mother   . Peripheral vascular disease Mother   . Heart disease Father   . Peripheral vascular  disease Father     Right leg amputation  . Hypertension Sister   . Heart disease Brother 73    Heart Disease before age 18    ROS:  Please see the history of present illness.  All other systems reviewed and negative.   PHYSICAL EXAM: VS:  BP 130/72 mmHg  Pulse 95  Ht 5\' 1"  (1.549 m)  Wt 155 lb 12.8 oz (70.67 kg)  BMI 29.45 kg/m2 Well nourished, well developed, in no acute distress HEENT: Pupils are equal round react to light accommodation extraocular movements are intact.  Neck: no JVDNo cervical lymphadenopathy. Cardiac: Irregular rate and rhythm without murmurs rubs or gallops. Lungs:  clear to auscultation bilaterally, no wheezing, rhonchi or rales Abd: soft, nontender, positive bowel sounds all quadrants, no hepatosplenomegaly Ext: no lower extremity edema.  2+ radial and dorsalis pedis pulses. Skin: warm and dry Neuro:  Grossly normal  EKG:  Atrial fibrillation with a rate of 95 bpm  ASSESSMENT AND PLAN:  Problem List Items Addressed This Visit    Essential hypertension   Relevant Medications   BENICAR 40 MG tablet   furosemide (LASIX) 20 MG tablet   Dyspnea on exertion   Atrial fibrillation - Primary   Relevant Medications   BENICAR 40 MG tablet   furosemide (LASIX) 20 MG tablet     Essential hypertension:  Blood pressure is at the upper limit of target. She was recently started on by Bystolic 5 mg by Dr. Ernie Hew.  She had enough samples to last until today.  We refilled the prescription.  Persistent atrial fibrillation:  Patient is back in atrial fibrillation her rate is 95 bpm.  We discussed anticoagulation and she is declined. This was also discussed with Dr. Ernie Hew and Dr. Acie Fredrickson in the past.  She was just started on by systolic which we will continue.  Dyspnea on exertion:  We ambulated her in the hallway until she got dyspneic. O2 saturations were 97% at the end.  Perhaps this is an anginal equivalent. We'll schedule Lexiscan Cardiolite.  Patient's mother was 80  when she had attempted stent placement.  Dyspnea and interstitial lung disease are possible side effects of Remicade.  Hopefully this is not a long lasting side effect.

## 2015-06-28 ENCOUNTER — Encounter (HOSPITAL_COMMUNITY): Payer: Medicare Other

## 2015-06-29 ENCOUNTER — Other Ambulatory Visit: Payer: Self-pay

## 2015-06-29 ENCOUNTER — Ambulatory Visit: Payer: Medicare Other | Admitting: Cardiovascular Disease

## 2015-06-29 ENCOUNTER — Telehealth: Payer: Self-pay

## 2015-06-29 NOTE — Telephone Encounter (Signed)
Pt called in needing refill of lasix- Pt stated she got the prescription for lasix from [facility] more than a year ago. Per Sharyn Lull RN pt needs lab work before refill can be given. I called and let pt know and she stated that her PCP told her the same thing and would not refill it either and told her to try her cardiologist. Pt stated she will not come in for lab work and will just do without lasix. I explained I could transfer her to scheduling to make a lab appointment.Then she hung up.Then she hung up.

## 2015-07-22 ENCOUNTER — Ambulatory Visit: Payer: Medicare Other | Admitting: Physician Assistant

## 2015-07-23 ENCOUNTER — Ambulatory Visit: Payer: Medicare Other | Admitting: Cardiovascular Disease

## 2015-07-29 ENCOUNTER — Telehealth: Payer: Self-pay | Admitting: Internal Medicine

## 2015-07-29 ENCOUNTER — Ambulatory Visit (INDEPENDENT_AMBULATORY_CARE_PROVIDER_SITE_OTHER): Payer: Medicare Other | Admitting: Cardiovascular Disease

## 2015-07-29 ENCOUNTER — Encounter: Payer: Self-pay | Admitting: Cardiovascular Disease

## 2015-07-29 VITALS — BP 132/72 | HR 124 | Ht 61.0 in | Wt 163.8 lb

## 2015-07-29 DIAGNOSIS — I10 Essential (primary) hypertension: Secondary | ICD-10-CM

## 2015-07-29 DIAGNOSIS — I481 Persistent atrial fibrillation: Secondary | ICD-10-CM

## 2015-07-29 DIAGNOSIS — I4891 Unspecified atrial fibrillation: Secondary | ICD-10-CM

## 2015-07-29 DIAGNOSIS — E785 Hyperlipidemia, unspecified: Secondary | ICD-10-CM

## 2015-07-29 DIAGNOSIS — I350 Nonrheumatic aortic (valve) stenosis: Secondary | ICD-10-CM

## 2015-07-29 DIAGNOSIS — I4819 Other persistent atrial fibrillation: Secondary | ICD-10-CM

## 2015-07-29 LAB — HEPATIC FUNCTION PANEL
ALT: 11 U/L (ref 0–35)
AST: 32 U/L (ref 0–37)
Albumin: 4 g/dL (ref 3.5–5.2)
Alkaline Phosphatase: 56 U/L (ref 39–117)
Bilirubin, Direct: 0.1 mg/dL (ref 0.0–0.3)
Total Bilirubin: 0.3 mg/dL (ref 0.2–1.2)
Total Protein: 7.8 g/dL (ref 6.0–8.3)

## 2015-07-29 LAB — LIPID PANEL
Cholesterol: 240 mg/dL — ABNORMAL HIGH (ref 0–200)
HDL: 72.5 mg/dL (ref >39.00)
NonHDL: 167.02
Total CHOL/HDL Ratio: 3
Triglycerides: 220 mg/dL — ABNORMAL HIGH (ref 0.0–149.0)
VLDL: 44 mg/dL — ABNORMAL HIGH (ref 0.0–40.0)

## 2015-07-29 LAB — CBC WITH DIFFERENTIAL/PLATELET
Basophils Absolute: 0 10*3/uL (ref 0.0–0.1)
Basophils Relative: 0.4 % (ref 0.0–3.0)
Eosinophils Absolute: 0 10*3/uL (ref 0.0–0.7)
Eosinophils Relative: 0.2 % (ref 0.0–5.0)
HCT: 31.7 % — ABNORMAL LOW (ref 36.0–46.0)
Hemoglobin: 10.2 g/dL — ABNORMAL LOW (ref 12.0–15.0)
Lymphocytes Relative: 9.1 % — ABNORMAL LOW (ref 12.0–46.0)
Lymphs Abs: 1.1 10*3/uL (ref 0.7–4.0)
MCHC: 32.1 g/dL (ref 30.0–36.0)
MCV: 78.7 fl (ref 78.0–100.0)
Monocytes Absolute: 0.2 10*3/uL (ref 0.1–1.0)
Monocytes Relative: 1.8 % — ABNORMAL LOW (ref 3.0–12.0)
Neutro Abs: 10.7 10*3/uL — ABNORMAL HIGH (ref 1.4–7.7)
Neutrophils Relative %: 88.5 % — ABNORMAL HIGH (ref 43.0–77.0)
Platelets: 394 10*3/uL (ref 150.0–400.0)
RBC: 4.03 Mil/uL (ref 3.87–5.11)
RDW: 18.2 % — ABNORMAL HIGH (ref 11.5–15.5)
WBC: 12.1 10*3/uL — ABNORMAL HIGH (ref 4.0–10.5)

## 2015-07-29 LAB — BASIC METABOLIC PANEL
BUN: 29 mg/dL — ABNORMAL HIGH (ref 6–23)
CO2: 26 mEq/L (ref 19–32)
Calcium: 9.4 mg/dL (ref 8.4–10.5)
Chloride: 100 mEq/L (ref 96–112)
Creatinine, Ser: 1.22 mg/dL — ABNORMAL HIGH (ref 0.40–1.20)
GFR: 44.41 mL/min — ABNORMAL LOW (ref >60.00)
Glucose, Bld: 124 mg/dL — ABNORMAL HIGH (ref 70–99)
Potassium: 4.5 mEq/L (ref 3.5–5.1)
Sodium: 136 mEq/L (ref 135–145)

## 2015-07-29 LAB — TSH: TSH: 2.72 u[IU]/mL (ref 0.35–4.50)

## 2015-07-29 LAB — LDL CHOLESTEROL, DIRECT: Direct LDL: 146 mg/dL

## 2015-07-29 MED ORDER — APIXABAN 5 MG PO TABS: 5.0000 mg | ORAL_TABLET | Freq: Two times a day (BID) | ORAL | Status: DC

## 2015-07-29 MED ORDER — FUROSEMIDE 20 MG PO TABS: 20.0000 mg | ORAL_TABLET | Freq: Every day | ORAL | Status: DC

## 2015-07-29 MED ORDER — METOPROLOL TARTRATE 25 MG PO TABS: 25.0000 mg | ORAL_TABLET | Freq: Two times a day (BID) | ORAL | Status: DC

## 2015-07-29 NOTE — Progress Notes (Signed)
Cardiology Office Note   Date:  07/29/2015   ID:  [name], DOB 10-31-1926, MRN [id number]  PCP:  Rachell Cipro, MD  Cardiologist:   Thayer Headings, MD   Chief Complaint  Patient presents with  . Atrial Fibrillation      History of Present Illness: [name] is a 81 y.o. female who presents for further evaluation of her paroxysmal atrial fibrillation, hypertension, hypothyroidism.  When she was last seen we decreased the dose of her Cardizem. At this point the Cardizem has been completely stopped. She continues to have problems with arthritis.  She has tried predinisone and  Remicaid.    She had lots of issues related to the Remicaid  She has continued to have tachypalpitations.    We tried her on Xarelto about a year ago. She started passing blood in her stool. The Xarelto was discontinued.  We had recommended that she see GI but she was not able to keep that apt.   Sept. 29, 2016:    I saw her several months ago  Started her on Eliquis.   She was seen by Tarri Fuller about a month ago. She has stopped the Eliquis. She has had GI bleeding in the past and refuses to take any blood thinner.  She has also stopped the Remicaid and  Humira.   She has stopped the metoprolol Was have tried Diltiazem but she stopped it.   Does not want to take any more medications .  Has run out of lasix    Past Medical History  Diagnosis Date  . HTN (hypertension)   . HLD (hyperlipidemia)   . Hypothyroidism   . Aortic stenosis, moderate     last echo in 2010; AVA 1.1; mild AS per echo in June 2013  . Edema of both legs     s/p laser treatment per Dr. Donnetta Hutching  . A-fib   . Hiatal hernia   . Iron deficiency anemia   . Rheumatoid arthritis   . IBS (irritable bowel syndrome)     Past Surgical History  Procedure Laterality Date  . Appendectomy  1955  . Foot surgery  2003    bilateral Hammer Toe  . Eye surgery  2005    Bilateral cataract  . Endovenous ablation  saphenous vein w/ laser  08-29-2012    left greater saphenous vein   Curt Jews MD  . Endovenous ablation saphenous vein w/ laser  09-19-2012    right greater saphenous vein by Curt Jews MD  . Stab phlebectomy Right 01-02-2013    10-15 incisions right thigh and calf by Curt Jews MD  . Carpal tunnel release Right   . Cataract extraction    . Endoscopic vein laser treatment       Current Outpatient Prescriptions  Medication Sig Dispense Refill  . acetaminophen (TYLENOL) 500 MG tablet Take 1,000 mg by mouth every 6 (six) hours as needed (joint pain).     Marland Kitchen aspirin 81 MG chewable tablet Chew 81 mg by mouth daily.    Marland Kitchen BENICAR 40 MG tablet Take 40 mg by mouth daily.    . calcium carbonate (OS-CAL) 600 MG TABS Take 1,800 mg by mouth daily.    . cyanocobalamin 500 MCG tablet Take 500 mcg by mouth daily.    . diclofenac sodium (VOLTAREN) 1 % GEL Apply 2 g topically 4 (four) times daily as needed (For joint pain).     Marland Kitchen dicyclomine (BENTYL) 20 MG tablet Take 10  mg by mouth every 6 (six) hours as needed for spasms. For stomach irritability.    Marland Kitchen estrogens, conjugated, (PREMARIN) 0.625 MG tablet Take 0.625 mg by mouth daily.     . furosemide (LASIX) 20 MG tablet Take 20 mg by mouth daily.    Marland Kitchen levothyroxine (SYNTHROID, LEVOTHROID) 75 MCG tablet Take 75 mcg by mouth daily.      . medroxyPROGESTERone (PROVERA) 2.5 MG tablet Take 5 mg by mouth as directed. For 10 days each month 20th-30th    . olopatadine (PATANOL) 0.1 % ophthalmic solution Place 1 drop into both eyes 2 (two) times daily.    . Omega-3 Fatty Acids (FISH OIL) 1000 MG CAPS Take 1 capsule by mouth daily.     . predniSONE (DELTASONE) 10 MG tablet Take 10 mg by mouth daily with breakfast.    . RESTASIS 0.05 % ophthalmic emulsion Place 1 drop into both eyes 2 (two) times daily.    . vitamin E 400 UNIT capsule Take 400 Units by mouth daily.     No current facility-administered medications for this visit.    Allergies:   Remicade;  Aspirin; Codeine; Coreg; and Penicillins    Social History:  The patient  reports that she quit smoking about 31 years ago. Her smoking use included Cigarettes. She has never used smokeless tobacco. She reports that she does not drink alcohol or use illicit drugs.   Family History:  The patient's family history includes Heart disease in her father and mother; Heart disease (age of onset: 29) in her brother; Hypertension in her sister; Peripheral vascular disease in her father, mother, and another family member.    ROS:  Please see the history of present illness.   Otherwise, review of systems are positive for none.   All other systems are reviewed and negative.    PHYSICAL EXAM: VS:  BP 132/72 mmHg  Pulse 124  Ht 5\' 1"  (1.549 m)  Wt 74.299 kg (163 lb 12.8 oz)  BMI 30.97 kg/m2 , BMI Body mass index is 30.97 kg/(m^2). GEN: Well nourished, well developed, in no acute distress HEENT: normal Neck: no JVD, carotid bruits, or masses Cardiac: irreg. Irreg.  Tachycardic.  Soft systolic murmur Respiratory:  clear to auscultation bilaterally, normal work of breathing GI: soft, nontender, nondistended, + BS MS: compression hose on .  Skin: warm and dry, no rash Neuro:  Strength and sensation are intact Psych: euthymic mood, full affect   EKG:  EKG is not ordered today.    Recent Labs: 09/02/2014: ALT 6; Hemoglobin 10.5*; Platelets 310 09/04/2014: BUN 18; Creatinine, Ser 1.09; Potassium 4.7; Sodium 138    Lipid Panel    Component Value Date/Time   CHOL 175 09/30/2012 0935   TRIG 130.0 09/30/2012 0935   HDL 69.30 09/30/2012 0935   CHOLHDL 3 09/30/2012 0935   VLDL 26.0 09/30/2012 0935   LDLCALC 80 09/30/2012 0935   LDLDIRECT 164.2 03/22/2011 1000      Wt Readings from Last 3 Encounters:  07/29/15 74.299 kg (163 lb 12.8 oz)  06/22/15 70.67 kg (155 lb 12.8 oz)  02/22/15 70.421 kg (155 lb 4 oz)     Other studies Reviewed: Additional studies/ records that were reviewed today  include: . Review of the above records demonstrates:    ECG: Atrial fibrillation with a ventricular rate of 124. She has nonspecific ST and T wave changes.  ASSESSMENT AND PLAN:  1. Paroxysmal atrial fib ablation: The patient is back in atrial fib and has  a rapid rate today. She's off the diltiazem.  We attempted to start metoprolol during her previous visit. She did not start it. Explained the importance of slowing her hr  We tried her on Xarelto in the past but she had significant BI bleeding.  We discussed starting Eliquis again .  She did not start it previously.   Now , she refuses to start any anticoagulant .   We have discussed the risk of CVA and she understands .   2. Aortic stenosis:  Mild , stable  3. HTN:  Will start metoprolol 25 bid again   4. ? Dementia:  Patient  Seems to be developing early dementia    I will see her in 6  months.   Current medicines are reviewed at length with the patient today.  The patient does not have concerns regarding medicines.  The following changes have been made:  Add metoprolol 25 mg BID   Labs/ tests ordered today include:  No orders of the defined types were placed in this encounter.    Disposition:   FU with me  in 3 months  Signed, Teyah Rossy, Wonda Cheng, MD  07/29/2015 2:10 PM    [facility] [address], [place], [place]  [address] Phone: [phone]; Fax: [phone]

## 2015-07-29 NOTE — Telephone Encounter (Signed)
Pt wants to be referred to a surgeon for her hiatal hernia. Dr. Hilarie Fredrickson please advise.

## 2015-07-29 NOTE — Patient Instructions (Signed)
Medication Instructions:  START Metoprolol 25 mg twice daily - take 12 hours apart START Lasix 20 mg once daily START Eliquis 5 mg twice daily - take 12 hours apart   Labwork: TODAY - CBC, TSH, Basic metabolic panel, liver panel, cholesterol   Your physician recommends that you return for lab work in: 4 weeks on the same day as your [facility] appointment - CBC, Basic metabolic panel    Testing/Procedures: None Ordered    Follow-Up: Your physician recommends that you schedule a follow-up appointment in: 4 weeks with [facility] for new Eliquis patient  Your physician wants you to follow-up in: 6 months with Dr. Acie Fredrickson.  You will receive a reminder letter in the mail two months in advance. If you don't receive a letter, please call our office to schedule the follow-up appointment.

## 2015-07-30 NOTE — Telephone Encounter (Signed)
Conservative measures recommended for large hiatal hernia, apparently which has failed Pt has multiple medical issues which will likely make surgery more risky That said, she is welcome to discuss with surgeon for an opinion Please refer to Dr. Zella Richer for large symptomatic [facility]

## 2015-07-30 NOTE — Telephone Encounter (Signed)
Pt scheduled to see Dr. Zella Richer with [facility] 08/10/15@3pm , pt to arrive there at 2:30pm. Pt aware of appt.

## 2015-07-30 NOTE — Progress Notes (Signed)
Left message for patient to call office for results. °

## 2015-08-17 DIAGNOSIS — K449 Diaphragmatic hernia without obstruction or gangrene: Secondary | ICD-10-CM | POA: Diagnosis not present

## 2015-08-27 ENCOUNTER — Ambulatory Visit: Payer: Medicare Other | Admitting: Pharmacist

## 2015-08-27 ENCOUNTER — Other Ambulatory Visit: Payer: Medicare Other

## 2015-09-06 ENCOUNTER — Encounter: Payer: Self-pay | Admitting: Infectious Disease

## 2015-09-06 ENCOUNTER — Ambulatory Visit (INDEPENDENT_AMBULATORY_CARE_PROVIDER_SITE_OTHER): Payer: Medicare Other | Admitting: Infectious Disease

## 2015-09-06 VITALS — BP 174/114 | HR 128 | Temp 97.6°F | Wt 159.5 lb

## 2015-09-06 DIAGNOSIS — M792 Neuralgia and neuritis, unspecified: Secondary | ICD-10-CM

## 2015-09-06 DIAGNOSIS — Z23 Encounter for immunization: Secondary | ICD-10-CM

## 2015-09-06 DIAGNOSIS — M79604 Pain in right leg: Secondary | ICD-10-CM | POA: Diagnosis not present

## 2015-09-06 DIAGNOSIS — I872 Venous insufficiency (chronic) (peripheral): Secondary | ICD-10-CM

## 2015-09-06 DIAGNOSIS — L52 Erythema nodosum: Secondary | ICD-10-CM | POA: Diagnosis not present

## 2015-09-06 DIAGNOSIS — M79605 Pain in left leg: Secondary | ICD-10-CM

## 2015-09-06 DIAGNOSIS — M05719 Rheumatoid arthritis with rheumatoid factor of unspecified shoulder without organ or systems involvement: Secondary | ICD-10-CM

## 2015-09-06 HISTORY — DX: Neuralgia and neuritis, unspecified: M79.2

## 2015-09-06 NOTE — Progress Notes (Signed)
Chief complaint: "worsening redness in legs, numbness, pain in feet" Subjective:    Patient ID: [name], female    DOB: 22-Jan-1927, 81 y.o.   MRN: [id number]  HPI   81 year old Caucasian lady with past medical history significant for aortic stenosis, atrial fibrillation, venous insufficiency, rheumatoid arthritis who states that after she had had laser surgery to her lower extremities to treat her venous insufficiency that she then developed painful erythematous lesions on her bilateral ankles which have been treated since May 2015 repeated with the with oral and intravenous antibiotics. She was notably admitted several times in June and apparently was given protracted course of IV antibiotics. She's been treated with various courses of antibiotics including clindamycin and amoxicillin clavulanic acid cephalexin doxycycline and IV vancomycin. During her most recent admission there was a workup for vasculitis undertaken including a biopsy performed by central callus surgery. The biopsy came back and as being red with being consistent with an abscess though the pictures that I have seen from the photographs taken by central callus surgery and the clinical appearance and course the patient do not fit with an abscess or purulent infection.  She originally came to me still on oral clindamycin and cephalexin still complaining of painful lesions both on her right lower extremity and left lower extremity. The right lesion was worse in pain and area of involvement. Notably it worsened after the biopsy performed by central surgery.  Her labs done as an inpatient were pertinent for an elevated sedimentation rate above 70 positive rheumatoid factor.  The patient had told me last year though that she has been given a prior diagnosis of rheumatoid arthritis and been treated with corticosteroids albeit at lower doses of 10 mg per day.  When I saw her I thought her last year I thought her exam was much  more consistent with ERYTHEMA NODOSUM or possibly PYODERMA GANGRENOSUM and I started her on prednisone 40mg  daily and stopped her oral antibiotics. She has had dramatic improvement in her redness and this to me proved a diagnosis of erythemal nodosum. She has since seen Dr. Trudie Reed but had an adverse reaction to remicaid infusion then was suggested to go onto imuran but was absolutely against this. She is now seeing Dr. Gypsy Lore who has pt on chronic 10 mg of prednisone daily.   She has bumped her leg recently with lesion that is now healing up. She is upset that her legs still hurt and she wants "answers: as to why she has numbness and pain and still has occasional flares of her E nodosum. I told her that her E nodosum was NOT an infectious disease and that her neuropathic complaints, arthritic complaints were not due to an Infectious Disease and advised her see her Rheumatologist and her PCP for further workup.  Past Medical History  Diagnosis Date  . HTN (hypertension)   . HLD (hyperlipidemia)   . Hypothyroidism   . Aortic stenosis, moderate     last echo in 2010; AVA 1.1; mild AS per echo in June 2013  . Edema of both legs     s/p laser treatment per Dr. Donnetta Hutching  . A-fib ([place])   . Hiatal hernia   . Iron deficiency anemia   . Rheumatoid arthritis ([place])   . IBS (irritable bowel syndrome)   . Neuropathic pain 09/06/2015    Past Surgical History  Procedure Laterality Date  . Appendectomy  1955  . Foot surgery  2003    bilateral Hammer Toe  .  Eye surgery  2005    Bilateral cataract  . Endovenous ablation saphenous vein w/ laser  08-29-2012    left greater saphenous vein   Curt Jews MD  . Endovenous ablation saphenous vein w/ laser  09-19-2012    right greater saphenous vein by Curt Jews MD  . Stab phlebectomy Right 01-02-2013    10-15 incisions right thigh and calf by Curt Jews MD  . Carpal tunnel release Right   . Cataract extraction    . Endoscopic vein laser treatment       Family History  Problem Relation Age of Onset  . Peripheral vascular disease    . Heart disease Mother   . Peripheral vascular disease Mother   . Heart disease Father   . Peripheral vascular disease Father     Right leg amputation  . Hypertension Sister   . Heart disease Brother 64    Heart Disease before age 41      Social History   Social History  . Marital Status: Widowed    Spouse Name: N/A  . Number of Children: 2  . Years of Education: N/A   Occupational History  . retired    Social History Main Topics  . Smoking status: Former Smoker    Types: Cigarettes    Quit date: 10/31/1983  . Smokeless tobacco: Never Used  . Alcohol Use: No  . Drug Use: No  . Sexual Activity: No   Other Topics Concern  . None   Social History Narrative   Lives by herself     Allergies  Allergen Reactions  . Remicade [Infliximab] Other (See Comments)    REACTION: "Enlarged intestines and hernia. Also pushed intestines on lower part of lungs."  . Aspirin Nausea And Vomiting    Low Dose is ok  . Codeine Nausea And Vomiting    "Deathly Sick"  . Coreg [Carvedilol] Other (See Comments)    Fatigue/ ill  . Penicillins Rash     Current outpatient prescriptions:  .  acetaminophen (TYLENOL) 500 MG tablet, Take 1,000 mg by mouth every 6 (six) hours as needed (joint pain). , Disp: , Rfl:  .  apixaban (ELIQUIS) 5 MG TABS tablet, Take 1 tablet (5 mg total) by mouth 2 (two) times daily., Disp: 60 tablet, Rfl: 11 .  aspirin 81 MG chewable tablet, Chew 81 mg by mouth daily., Disp: , Rfl:  .  BENICAR 40 MG tablet, Take 40 mg by mouth daily., Disp: , Rfl:  .  calcium carbonate (OS-CAL) 600 MG TABS, Take 1,800 mg by mouth daily., Disp: , Rfl:  .  cyanocobalamin 500 MCG tablet, Take 500 mcg by mouth daily., Disp: , Rfl:  .  diclofenac sodium (VOLTAREN) 1 % GEL, Apply 2 g topically 4 (four) times daily as needed (For joint pain). , Disp: , Rfl:  .  dicyclomine (BENTYL) 20 MG tablet, Take 10  mg by mouth every 6 (six) hours as needed for spasms. For stomach irritability., Disp: , Rfl:  .  estrogens, conjugated, (PREMARIN) 0.625 MG tablet, Take 0.625 mg by mouth daily. , Disp: , Rfl:  .  furosemide (LASIX) 20 MG tablet, Take 1 tablet (20 mg total) by mouth daily., Disp: 31 tablet, Rfl: 11 .  levothyroxine (SYNTHROID, LEVOTHROID) 75 MCG tablet, Take 75 mcg by mouth daily.  , Disp: , Rfl:  .  medroxyPROGESTERone (PROVERA) 2.5 MG tablet, Take 5 mg by mouth as directed. For 10 days each month 20th-30th, Disp: , Rfl:  .  metoprolol tartrate (LOPRESSOR) 25 MG tablet, Take 1 tablet (25 mg total) by mouth 2 (two) times daily., Disp: 60 tablet, Rfl: 11 .  olopatadine (PATANOL) 0.1 % ophthalmic solution, Place 1 drop into both eyes 2 (two) times daily., Disp: , Rfl:  .  Omega-3 Fatty Acids (FISH OIL) 1000 MG CAPS, Take 1 capsule by mouth daily. , Disp: , Rfl:  .  predniSONE (DELTASONE) 10 MG tablet, Take 10 mg by mouth daily with breakfast., Disp: , Rfl:  .  RESTASIS 0.05 % ophthalmic emulsion, Place 1 drop into both eyes 2 (two) times daily., Disp: , Rfl:  .  vitamin E 400 UNIT capsule, Take 400 Units by mouth daily., Disp: , Rfl:    Review of Systems  Constitutional: Negative for fever, chills, diaphoresis, activity change, appetite change, fatigue and unexpected weight change.  HENT: Negative for congestion, rhinorrhea, sinus pressure, sneezing, sore throat and trouble swallowing.   Eyes: Negative for photophobia and visual disturbance.  Respiratory: Negative for cough, chest tightness, shortness of breath, wheezing and stridor.   Cardiovascular: Negative for chest pain, palpitations and leg swelling.  Gastrointestinal: Negative for nausea, vomiting, abdominal pain, diarrhea, constipation, blood in stool, abdominal distention and anal bleeding.  Genitourinary: Negative for dysuria, hematuria, flank pain and difficulty urinating.  Musculoskeletal: Positive for myalgias, joint swelling and  arthralgias. Negative for back pain and gait problem.  Skin: Positive for color change, rash and wound. Negative for pallor.  Neurological: Positive for weakness. Negative for dizziness, tremors and light-headedness.  Hematological: Negative for adenopathy. Does not bruise/bleed easily.  Psychiatric/Behavioral: Negative for behavioral problems, confusion, sleep disturbance, dysphoric mood, decreased concentration and agitation.        Objective:   Physical Exam  Constitutional: She is oriented to person, place, and time. She appears well-nourished. No distress.  HENT:  Head: Normocephalic and atraumatic.  Mouth/Throat: Oropharynx is clear and moist. No oropharyngeal exudate.  Eyes: Conjunctivae and EOM are normal. No scleral icterus.  Neck: Normal range of motion. Neck supple.  Cardiovascular: Normal rate.   Pulmonary/Chest: Effort normal. No respiratory distress. She has no wheezes.  Abdominal: Soft. She exhibits no distension.  Musculoskeletal: She exhibits edema and tenderness.  Neurological: She is alert and oriented to person, place, and time. She exhibits normal muscle tone. Coordination normal.  Skin: Skin is warm and dry. She is not diaphoretic. No erythema. No pallor.  Psychiatric: She has a normal mood and affect. Her behavior is normal. Judgment and thought content normal.  Lesions present during hospitalization picture by [facility]:  Right LE 09/01/14:     RLE      RLE today 10/05/14:       LLE 09/21/14 :     Exam 10/05/14:     Exam 09/06/15:           Assessment & Plan:   Erythema nodosum: she probably NEEDS TO GO BACK TO A HIGHER DOSE OF STEROIDS SINCE HER RECENT LEG LESION may be a flare of EN rather than the actual "bumping into something" but I will defer this to her Rheumatologist and her PCP  Neuropathic pains: I offered to test her for syphilis (we have already checked for HIV) but felt remainder of workup for this should be done by PCP. It  does not at all sound like manifestation of an infectious disease  I spent greater than 25  minutes with the patient including greater than 50% of time in face to face counsel of the patient re her  E nodosum, her arthralgias, neuropathic pains and in coordination of her care.

## 2015-09-14 DIAGNOSIS — Z0000 Encounter for general adult medical examination without abnormal findings: Secondary | ICD-10-CM | POA: Diagnosis not present

## 2015-09-14 DIAGNOSIS — D6489 Other specified anemias: Secondary | ICD-10-CM | POA: Diagnosis not present

## 2015-09-14 DIAGNOSIS — Z1389 Encounter for screening for other disorder: Secondary | ICD-10-CM | POA: Diagnosis not present

## 2015-09-14 DIAGNOSIS — Z6832 Body mass index (BMI) 32.0-32.9, adult: Secondary | ICD-10-CM | POA: Diagnosis not present

## 2015-09-14 DIAGNOSIS — I480 Paroxysmal atrial fibrillation: Secondary | ICD-10-CM | POA: Diagnosis not present

## 2015-09-14 DIAGNOSIS — E038 Other specified hypothyroidism: Secondary | ICD-10-CM | POA: Diagnosis not present

## 2015-09-14 DIAGNOSIS — D509 Iron deficiency anemia, unspecified: Secondary | ICD-10-CM | POA: Diagnosis not present

## 2015-09-14 DIAGNOSIS — E784 Other hyperlipidemia: Secondary | ICD-10-CM | POA: Diagnosis not present

## 2015-09-14 DIAGNOSIS — N183 Chronic kidney disease, stage 3 (moderate): Secondary | ICD-10-CM | POA: Diagnosis not present

## 2015-09-14 DIAGNOSIS — F419 Anxiety disorder, unspecified: Secondary | ICD-10-CM | POA: Diagnosis not present

## 2015-09-14 DIAGNOSIS — I10 Essential (primary) hypertension: Secondary | ICD-10-CM | POA: Diagnosis not present

## 2015-09-14 DIAGNOSIS — M0680 Other specified rheumatoid arthritis, unspecified site: Secondary | ICD-10-CM | POA: Diagnosis not present

## 2015-09-14 DIAGNOSIS — D649 Anemia, unspecified: Secondary | ICD-10-CM | POA: Diagnosis not present

## 2015-09-14 DIAGNOSIS — I350 Nonrheumatic aortic (valve) stenosis: Secondary | ICD-10-CM | POA: Diagnosis not present

## 2015-09-14 DIAGNOSIS — Z8249 Family history of ischemic heart disease and other diseases of the circulatory system: Secondary | ICD-10-CM | POA: Diagnosis not present

## 2015-10-14 DIAGNOSIS — D649 Anemia, unspecified: Secondary | ICD-10-CM | POA: Diagnosis not present

## 2015-10-26 DIAGNOSIS — M0579 Rheumatoid arthritis with rheumatoid factor of multiple sites without organ or systems involvement: Secondary | ICD-10-CM | POA: Diagnosis not present

## 2015-10-26 DIAGNOSIS — M170 Bilateral primary osteoarthritis of knee: Secondary | ICD-10-CM | POA: Diagnosis not present

## 2015-10-26 DIAGNOSIS — M19041 Primary osteoarthritis, right hand: Secondary | ICD-10-CM | POA: Diagnosis not present

## 2015-10-26 DIAGNOSIS — Z79899 Other long term (current) drug therapy: Secondary | ICD-10-CM | POA: Diagnosis not present

## 2015-11-10 DIAGNOSIS — D649 Anemia, unspecified: Secondary | ICD-10-CM | POA: Diagnosis not present

## 2015-11-30 DIAGNOSIS — L821 Other seborrheic keratosis: Secondary | ICD-10-CM | POA: Diagnosis not present

## 2015-11-30 DIAGNOSIS — L820 Inflamed seborrheic keratosis: Secondary | ICD-10-CM | POA: Diagnosis not present

## 2015-11-30 DIAGNOSIS — L304 Erythema intertrigo: Secondary | ICD-10-CM | POA: Diagnosis not present

## 2015-12-14 DIAGNOSIS — D649 Anemia, unspecified: Secondary | ICD-10-CM | POA: Diagnosis not present

## 2016-01-06 DIAGNOSIS — D509 Iron deficiency anemia, unspecified: Secondary | ICD-10-CM | POA: Diagnosis not present

## 2016-01-17 DIAGNOSIS — Z1231 Encounter for screening mammogram for malignant neoplasm of breast: Secondary | ICD-10-CM | POA: Diagnosis not present

## 2016-02-10 DIAGNOSIS — D509 Iron deficiency anemia, unspecified: Secondary | ICD-10-CM | POA: Diagnosis not present

## 2016-03-14 DIAGNOSIS — E784 Other hyperlipidemia: Secondary | ICD-10-CM | POA: Diagnosis not present

## 2016-03-14 DIAGNOSIS — E038 Other specified hypothyroidism: Secondary | ICD-10-CM | POA: Diagnosis not present

## 2016-03-14 DIAGNOSIS — N183 Chronic kidney disease, stage 3 (moderate): Secondary | ICD-10-CM | POA: Diagnosis not present

## 2016-03-14 DIAGNOSIS — G2581 Restless legs syndrome: Secondary | ICD-10-CM | POA: Diagnosis not present

## 2016-03-14 DIAGNOSIS — D509 Iron deficiency anemia, unspecified: Secondary | ICD-10-CM | POA: Diagnosis not present

## 2016-03-14 DIAGNOSIS — D649 Anemia, unspecified: Secondary | ICD-10-CM | POA: Diagnosis not present

## 2016-03-14 DIAGNOSIS — I10 Essential (primary) hypertension: Secondary | ICD-10-CM | POA: Diagnosis not present

## 2016-03-14 DIAGNOSIS — E538 Deficiency of other specified B group vitamins: Secondary | ICD-10-CM | POA: Diagnosis not present

## 2016-03-14 DIAGNOSIS — Z6832 Body mass index (BMI) 32.0-32.9, adult: Secondary | ICD-10-CM | POA: Diagnosis not present

## 2016-05-05 DIAGNOSIS — R55 Syncope and collapse: Secondary | ICD-10-CM | POA: Diagnosis not present

## 2016-05-05 DIAGNOSIS — R404 Transient alteration of awareness: Secondary | ICD-10-CM | POA: Diagnosis not present

## 2016-05-08 DIAGNOSIS — Z961 Presence of intraocular lens: Secondary | ICD-10-CM | POA: Diagnosis not present

## 2016-05-08 DIAGNOSIS — H52203 Unspecified astigmatism, bilateral: Secondary | ICD-10-CM | POA: Diagnosis not present

## 2016-05-08 DIAGNOSIS — H353132 Nonexudative age-related macular degeneration, bilateral, intermediate dry stage: Secondary | ICD-10-CM | POA: Diagnosis not present

## 2016-09-05 DIAGNOSIS — R350 Frequency of micturition: Secondary | ICD-10-CM | POA: Diagnosis not present

## 2016-09-05 DIAGNOSIS — R300 Dysuria: Secondary | ICD-10-CM | POA: Diagnosis not present

## 2016-09-05 DIAGNOSIS — N950 Postmenopausal bleeding: Secondary | ICD-10-CM | POA: Diagnosis not present

## 2016-09-05 DIAGNOSIS — N390 Urinary tract infection, site not specified: Secondary | ICD-10-CM | POA: Diagnosis not present

## 2016-09-12 DIAGNOSIS — R8299 Other abnormal findings in urine: Secondary | ICD-10-CM | POA: Diagnosis not present

## 2016-09-12 DIAGNOSIS — E038 Other specified hypothyroidism: Secondary | ICD-10-CM | POA: Diagnosis not present

## 2016-09-12 DIAGNOSIS — I10 Essential (primary) hypertension: Secondary | ICD-10-CM | POA: Diagnosis not present

## 2016-09-12 DIAGNOSIS — N390 Urinary tract infection, site not specified: Secondary | ICD-10-CM | POA: Diagnosis not present

## 2016-09-12 DIAGNOSIS — E784 Other hyperlipidemia: Secondary | ICD-10-CM | POA: Diagnosis not present

## 2016-09-12 DIAGNOSIS — E538 Deficiency of other specified B group vitamins: Secondary | ICD-10-CM | POA: Diagnosis not present

## 2016-09-19 DIAGNOSIS — Z0000 Encounter for general adult medical examination without abnormal findings: Secondary | ICD-10-CM | POA: Diagnosis not present

## 2016-09-19 DIAGNOSIS — N390 Urinary tract infection, site not specified: Secondary | ICD-10-CM | POA: Diagnosis not present

## 2016-09-19 DIAGNOSIS — I480 Paroxysmal atrial fibrillation: Secondary | ICD-10-CM | POA: Diagnosis not present

## 2016-09-19 DIAGNOSIS — D649 Anemia, unspecified: Secondary | ICD-10-CM | POA: Diagnosis not present

## 2016-09-19 DIAGNOSIS — Z6831 Body mass index (BMI) 31.0-31.9, adult: Secondary | ICD-10-CM | POA: Diagnosis not present

## 2016-09-19 DIAGNOSIS — E038 Other specified hypothyroidism: Secondary | ICD-10-CM | POA: Diagnosis not present

## 2016-09-19 DIAGNOSIS — Z1389 Encounter for screening for other disorder: Secondary | ICD-10-CM | POA: Diagnosis not present

## 2016-09-19 DIAGNOSIS — E538 Deficiency of other specified B group vitamins: Secondary | ICD-10-CM | POA: Diagnosis not present

## 2016-09-19 DIAGNOSIS — I10 Essential (primary) hypertension: Secondary | ICD-10-CM | POA: Diagnosis not present

## 2016-09-19 DIAGNOSIS — D509 Iron deficiency anemia, unspecified: Secondary | ICD-10-CM | POA: Diagnosis not present

## 2016-09-19 DIAGNOSIS — G2581 Restless legs syndrome: Secondary | ICD-10-CM | POA: Diagnosis not present

## 2016-09-19 DIAGNOSIS — Z23 Encounter for immunization: Secondary | ICD-10-CM | POA: Diagnosis not present

## 2016-10-17 DIAGNOSIS — D509 Iron deficiency anemia, unspecified: Secondary | ICD-10-CM | POA: Diagnosis not present

## 2016-11-06 DIAGNOSIS — L03116 Cellulitis of left lower limb: Secondary | ICD-10-CM | POA: Diagnosis not present

## 2016-11-14 DIAGNOSIS — L03116 Cellulitis of left lower limb: Secondary | ICD-10-CM | POA: Diagnosis not present

## 2016-11-14 DIAGNOSIS — I7381 Erythromelalgia: Secondary | ICD-10-CM | POA: Diagnosis not present

## 2016-11-23 DIAGNOSIS — D509 Iron deficiency anemia, unspecified: Secondary | ICD-10-CM | POA: Diagnosis not present

## 2016-11-24 ENCOUNTER — Other Ambulatory Visit: Payer: Self-pay | Admitting: Rheumatology

## 2016-11-24 MED ORDER — PREDNISONE 5 MG PO TABS: 10.0000 mg | ORAL_TABLET | Freq: Every day | ORAL | 1 refills | Status: DC

## 2016-11-24 NOTE — Telephone Encounter (Signed)
Last Visit: 10/26/15 Next Visit: 12/07/16  Okay to refill prednisone?

## 2016-11-24 NOTE — Telephone Encounter (Signed)
Patient is requesting refill of prednisone be sent [facility] on [address]. Patient has scheduled appt for 12/07/16 and is requesting enough until then

## 2016-12-07 ENCOUNTER — Ambulatory Visit: Payer: Self-pay | Admitting: Rheumatology

## 2017-01-02 DIAGNOSIS — D509 Iron deficiency anemia, unspecified: Secondary | ICD-10-CM | POA: Diagnosis not present

## 2017-01-03 NOTE — Progress Notes (Deleted)
Office Visit Note  Patient: [name]             Date of Birth: 24-Jan-1927           MRN: [id number]             PCP: Velna Hatchet, MD Referring: Velna Hatchet, MD Visit Date: 01/04/2017 Occupation: @GUAROCC @    Subjective:  No chief complaint on file.   History of Present Illness: [name] is a 83 y.o. female ***   Activities of Daily Living:  Patient reports morning stiffness for *** {minute/hour:19697}.   Patient {ACTIONS;DENIES/REPORTS:21021675::"Denies"} nocturnal pain.  Difficulty dressing/grooming: {ACTIONS;DENIES/REPORTS:21021675::"Denies"} Difficulty climbing stairs: {ACTIONS;DENIES/REPORTS:21021675::"Denies"} Difficulty getting out of chair: {ACTIONS;DENIES/REPORTS:21021675::"Denies"} Difficulty using hands for taps, buttons, cutlery, and/or writing: {ACTIONS;DENIES/REPORTS:21021675::"Denies"}   No Rheumatology ROS completed.   PMFS History:  Patient Active Problem List   Diagnosis Date Noted  . Neuropathic pain 09/06/2015  . Dyspnea on exertion 06/22/2015  . Erythema nodosum 09/21/2014  . Rheumatoid arthritis ([place]) 09/21/2014  . Essential hypertension 09/02/2014  . Recurrent cellulitis of lower leg 09/01/2014  . Rectal bleeding 05/15/2014  . Lower GI bleed 05/15/2014  . Atrial fibrillation ([place]) 05/11/2014  . HLD (hyperlipidemia) 04/02/2014  . Unspecified hypothyroidism 04/02/2014  . Left leg cellulitis 04/01/2014  . Cellulitis 04/01/2014  . Pain in limb 02/19/2014  . PAC (premature atrial contraction) 01/23/2014  . Atrial tachycardia, paroxysmal ([place]) 01/23/2014  . Near syncope 01/22/2014  . Syncope 01/22/2014  . Varicose veins of lower extremities with other complications 50/06/3817  . Venous insufficiency of both lower extremities 04/19/2012  . Leg edema 01/17/2012  . Aortic stenosis 03/22/2011  . HTN (hypertension) 03/22/2011    Past Medical History:  Diagnosis Date  . A-fib ([place])   . Aortic stenosis, moderate    last echo in 2010; AVA 1.1; mild AS per echo in June 2013  . Edema of both legs    s/p laser treatment per Dr. Donnetta Hutching  . Hiatal hernia   . HLD (hyperlipidemia)   . HTN (hypertension)   . Hypothyroidism   . IBS (irritable bowel syndrome)   . Iron deficiency anemia   . Neuropathic pain 09/06/2015  . Rheumatoid arthritis ([place])     Family History  Problem Relation Age of Onset  . Peripheral vascular disease    . Heart disease Mother   . Peripheral vascular disease Mother   . Heart disease Father   . Peripheral vascular disease Father     Right leg amputation  . Hypertension Sister   . Heart disease Brother 69    Heart Disease before age 27   Past Surgical History:  Procedure Laterality Date  . APPENDECTOMY  1955  . CARPAL TUNNEL RELEASE Right   . CATARACT EXTRACTION    . ENDOSCOPIC VEIN LASER TREATMENT    . ENDOVENOUS ABLATION SAPHENOUS VEIN W/ LASER  08-29-2012   left greater saphenous vein   Sherren Mocha Early MD  . ENDOVENOUS ABLATION SAPHENOUS VEIN W/ LASER  09-19-2012   right greater saphenous vein by Curt Jews MD  . EYE SURGERY  2005   Bilateral cataract  . FOOT SURGERY  2003   bilateral Hammer Toe  . stab phlebectomy Right 01-02-2013   10-15 incisions right thigh and calf by Curt Jews MD   Social History   Social History Narrative   Lives by herself      Objective: Vital Signs: There were no vitals taken for this visit.   Physical Exam  Musculoskeletal Exam: ***  CDAI Exam: No CDAI exam completed.    Investigation: Findings:  TB Gold Negative 03/2015  The patient has labs from 04/14/2015 and we have already discussed those when she came to the 04/27/2015 visit.     No visits with results within 6 Month(s) from this visit.  Latest known visit with results is:  Office Visit on 07/29/2015  Component Date Value Ref Range Status  . WBC 07/29/2015 12.1* 4.0 - 10.5 K/uL Final  . RBC 07/29/2015 4.03  3.87 - 5.11 Mil/uL Final  . Hemoglobin 07/29/2015 10.2*  12.0 - 15.0 g/dL Final  . HCT 07/29/2015 31.7* 36.0 - 46.0 % Final  . MCV 07/29/2015 78.7  78.0 - 100.0 fl Final  . MCHC 07/29/2015 32.1  30.0 - 36.0 g/dL Final  . RDW 07/29/2015 18.2* 11.5 - 15.5 % Final  . Platelets 07/29/2015 394.0  150.0 - 400.0 K/uL Final  . Neutrophils Relative % 07/29/2015 88.5 Repeated and verified X2.* 43.0 - 77.0 % Final  . Lymphocytes Relative 07/29/2015 9.1* 12.0 - 46.0 % Final  . Monocytes Relative 07/29/2015 1.8* 3.0 - 12.0 % Final  . Eosinophils Relative 07/29/2015 0.2  0.0 - 5.0 % Final  . Basophils Relative 07/29/2015 0.4  0.0 - 3.0 % Final  . Neutro Abs 07/29/2015 10.7* 1.4 - 7.7 K/uL Final  . Lymphs Abs 07/29/2015 1.1  0.7 - 4.0 K/uL Final  . Monocytes Absolute 07/29/2015 0.2  0.1 - 1.0 K/uL Final  . Eosinophils Absolute 07/29/2015 0.0  0.0 - 0.7 K/uL Final  . Basophils Absolute 07/29/2015 0.0  0.0 - 0.1 K/uL Final  . Cholesterol 07/29/2015 240* 0 - 200 mg/dL Final  . Triglycerides 07/29/2015 220.0* 0.0 - 149.0 mg/dL Final  . HDL 07/29/2015 72.50  >39.00 mg/dL Final  . VLDL 07/29/2015 44.0* 0.0 - 40.0 mg/dL Final  . Total CHOL/HDL Ratio 07/29/2015 3   Final  . NonHDL 07/29/2015 167.02   Final  . TSH 07/29/2015 2.72  0.35 - 4.50 uIU/mL Final  . Sodium 07/29/2015 136  135 - 145 mEq/L Final  . Potassium 07/29/2015 4.5  3.5 - 5.1 mEq/L Final  . Chloride 07/29/2015 100  96 - 112 mEq/L Final  . CO2 07/29/2015 26  19 - 32 mEq/L Final  . Glucose, Bld 07/29/2015 124* 70 - 99 mg/dL Final  . BUN 07/29/2015 29* 6 - 23 mg/dL Final  . Creatinine, Ser 07/29/2015 1.22* 0.40 - 1.20 mg/dL Final  . Calcium 07/29/2015 9.4  8.4 - 10.5 mg/dL Final  . GFR 07/29/2015 44.41* >60.00 mL/min Final  . Total Bilirubin 07/29/2015 0.3  0.2 - 1.2 mg/dL Final  . Bilirubin, Direct 07/29/2015 0.1  0.0 - 0.3 mg/dL Final  . Alkaline Phosphatase 07/29/2015 56  39 - 117 U/L Final  . AST 07/29/2015 32  0 - 37 U/L Final  . ALT 07/29/2015 11  0 - 35 U/L Final  . Total Protein 07/29/2015  7.8  6.0 - 8.3 g/dL Final  . Albumin 07/29/2015 4.0  3.5 - 5.2 g/dL Final  . Direct LDL 07/29/2015 146.0  mg/dL Final      Imaging: No results found.  Speciality Comments: No specialty comments available.    Procedures:  No procedures performed Allergies: Remicade [infliximab]; Aspirin; Codeine; Coreg [carvedilol]; and Penicillins   Assessment / Plan:     Visit Diagnoses: No diagnosis found.    Orders: No orders of the defined types were placed in this encounter.  No orders of the  defined types were placed in this encounter.   Face-to-face time spent with patient was *** minutes. 50% of time was spent in counseling and coordination of care.  Follow-Up Instructions: No Follow-up on file.   Zuley Lutter, [place]  Note - This record has been created using [employer].  Chart creation errors have been sought, but may not always  have been located. Such creation errors do not reflect on  the standard of medical care.

## 2017-01-04 ENCOUNTER — Ambulatory Visit: Payer: Self-pay | Admitting: Rheumatology

## 2017-01-04 DIAGNOSIS — L88 Pyoderma gangrenosum: Secondary | ICD-10-CM | POA: Insufficient documentation

## 2017-01-04 DIAGNOSIS — M19071 Primary osteoarthritis, right ankle and foot: Secondary | ICD-10-CM | POA: Insufficient documentation

## 2017-01-04 DIAGNOSIS — M159 Polyosteoarthritis, unspecified: Secondary | ICD-10-CM | POA: Insufficient documentation

## 2017-01-04 DIAGNOSIS — M19072 Primary osteoarthritis, left ankle and foot: Secondary | ICD-10-CM

## 2017-01-04 DIAGNOSIS — M170 Bilateral primary osteoarthritis of knee: Secondary | ICD-10-CM | POA: Insufficient documentation

## 2017-01-04 DIAGNOSIS — M19042 Primary osteoarthritis, left hand: Secondary | ICD-10-CM

## 2017-01-04 DIAGNOSIS — Z79899 Other long term (current) drug therapy: Secondary | ICD-10-CM | POA: Insufficient documentation

## 2017-01-04 DIAGNOSIS — M19041 Primary osteoarthritis, right hand: Secondary | ICD-10-CM | POA: Insufficient documentation

## 2017-01-16 DIAGNOSIS — L97509 Non-pressure chronic ulcer of other part of unspecified foot with unspecified severity: Secondary | ICD-10-CM | POA: Diagnosis not present

## 2017-01-16 DIAGNOSIS — Z6832 Body mass index (BMI) 32.0-32.9, adult: Secondary | ICD-10-CM | POA: Diagnosis not present

## 2017-02-07 DIAGNOSIS — E538 Deficiency of other specified B group vitamins: Secondary | ICD-10-CM | POA: Diagnosis not present

## 2017-02-14 DIAGNOSIS — H524 Presbyopia: Secondary | ICD-10-CM | POA: Diagnosis not present

## 2017-02-14 DIAGNOSIS — H353122 Nonexudative age-related macular degeneration, left eye, intermediate dry stage: Secondary | ICD-10-CM | POA: Diagnosis not present

## 2017-02-14 DIAGNOSIS — H40053 Ocular hypertension, bilateral: Secondary | ICD-10-CM | POA: Diagnosis not present

## 2017-02-14 DIAGNOSIS — H04123 Dry eye syndrome of bilateral lacrimal glands: Secondary | ICD-10-CM | POA: Diagnosis not present

## 2017-03-13 DIAGNOSIS — E538 Deficiency of other specified B group vitamins: Secondary | ICD-10-CM | POA: Diagnosis not present

## 2017-04-25 DIAGNOSIS — E538 Deficiency of other specified B group vitamins: Secondary | ICD-10-CM | POA: Diagnosis not present

## 2017-04-30 DIAGNOSIS — M79672 Pain in left foot: Secondary | ICD-10-CM | POA: Diagnosis not present

## 2017-04-30 DIAGNOSIS — M79671 Pain in right foot: Secondary | ICD-10-CM | POA: Diagnosis not present

## 2017-05-03 ENCOUNTER — Other Ambulatory Visit: Payer: Self-pay | Admitting: Cardiovascular Disease

## 2017-05-03 NOTE — Telephone Encounter (Signed)
Please advise on refill request as the patient has not been seen since 2016. Also it is noted on the request that the patient last refilled this on 07/07/16. Thanks, MI

## 2017-05-03 NOTE — Telephone Encounter (Signed)
Called patient to schedule overdue visit so she can continue to get refills. She states she will "never come back to the office because she's going to the [facility] She understands she will need to get refills from them. She agrees with treatment plan.

## 2017-05-15 ENCOUNTER — Ambulatory Visit: Payer: Medicare Other | Admitting: Psychology

## 2017-06-05 DIAGNOSIS — G8929 Other chronic pain: Secondary | ICD-10-CM | POA: Diagnosis not present

## 2017-06-05 DIAGNOSIS — L03116 Cellulitis of left lower limb: Secondary | ICD-10-CM | POA: Diagnosis not present

## 2017-06-05 DIAGNOSIS — I872 Venous insufficiency (chronic) (peripheral): Secondary | ICD-10-CM | POA: Diagnosis not present

## 2017-06-05 DIAGNOSIS — M7989 Other specified soft tissue disorders: Secondary | ICD-10-CM | POA: Diagnosis not present

## 2017-06-05 DIAGNOSIS — M79672 Pain in left foot: Secondary | ICD-10-CM | POA: Diagnosis not present

## 2017-06-07 DIAGNOSIS — I83024 Varicose veins of left lower extremity with ulcer of heel and midfoot: Secondary | ICD-10-CM | POA: Diagnosis not present

## 2017-06-07 DIAGNOSIS — G8929 Other chronic pain: Secondary | ICD-10-CM | POA: Diagnosis not present

## 2017-06-07 DIAGNOSIS — M7989 Other specified soft tissue disorders: Secondary | ICD-10-CM | POA: Diagnosis not present

## 2017-06-07 DIAGNOSIS — L03116 Cellulitis of left lower limb: Secondary | ICD-10-CM | POA: Diagnosis not present

## 2017-06-07 DIAGNOSIS — L97421 Non-pressure chronic ulcer of left heel and midfoot limited to breakdown of skin: Secondary | ICD-10-CM | POA: Diagnosis not present

## 2017-06-07 DIAGNOSIS — M79672 Pain in left foot: Secondary | ICD-10-CM | POA: Diagnosis not present

## 2017-06-14 DIAGNOSIS — I83024 Varicose veins of left lower extremity with ulcer of heel and midfoot: Secondary | ICD-10-CM | POA: Diagnosis not present

## 2017-06-14 DIAGNOSIS — L97421 Non-pressure chronic ulcer of left heel and midfoot limited to breakdown of skin: Secondary | ICD-10-CM | POA: Diagnosis not present

## 2017-06-21 DIAGNOSIS — I83024 Varicose veins of left lower extremity with ulcer of heel and midfoot: Secondary | ICD-10-CM | POA: Diagnosis not present

## 2017-06-21 DIAGNOSIS — L97421 Non-pressure chronic ulcer of left heel and midfoot limited to breakdown of skin: Secondary | ICD-10-CM | POA: Diagnosis not present

## 2017-06-28 DIAGNOSIS — I83004 Varicose veins of unspecified lower extremity with ulcer of heel and midfoot: Secondary | ICD-10-CM | POA: Diagnosis not present

## 2017-06-28 DIAGNOSIS — L97401 Non-pressure chronic ulcer of unspecified heel and midfoot limited to breakdown of skin: Secondary | ICD-10-CM | POA: Diagnosis not present

## 2017-07-19 DIAGNOSIS — I499 Cardiac arrhythmia, unspecified: Secondary | ICD-10-CM | POA: Diagnosis not present

## 2017-07-19 DIAGNOSIS — I082 Rheumatic disorders of both aortic and tricuspid valves: Secondary | ICD-10-CM | POA: Diagnosis not present

## 2017-07-19 DIAGNOSIS — Z7982 Long term (current) use of aspirin: Secondary | ICD-10-CM | POA: Diagnosis not present

## 2017-07-19 DIAGNOSIS — I872 Venous insufficiency (chronic) (peripheral): Secondary | ICD-10-CM | POA: Diagnosis not present

## 2017-07-19 DIAGNOSIS — I4892 Unspecified atrial flutter: Secondary | ICD-10-CM | POA: Diagnosis not present

## 2017-07-19 DIAGNOSIS — K5900 Constipation, unspecified: Secondary | ICD-10-CM | POA: Diagnosis not present

## 2017-07-19 DIAGNOSIS — F17211 Nicotine dependence, cigarettes, in remission: Secondary | ICD-10-CM | POA: Diagnosis not present

## 2017-07-19 DIAGNOSIS — R110 Nausea: Secondary | ICD-10-CM | POA: Diagnosis not present

## 2017-07-19 DIAGNOSIS — M791 Myalgia: Secondary | ICD-10-CM | POA: Diagnosis not present

## 2017-07-19 DIAGNOSIS — I481 Persistent atrial fibrillation: Secondary | ICD-10-CM | POA: Diagnosis not present

## 2017-07-19 DIAGNOSIS — Z136 Encounter for screening for cardiovascular disorders: Secondary | ICD-10-CM | POA: Diagnosis not present

## 2017-07-19 DIAGNOSIS — R000 Tachycardia, unspecified: Secondary | ICD-10-CM | POA: Diagnosis not present

## 2017-07-19 DIAGNOSIS — Z87891 Personal history of nicotine dependence: Secondary | ICD-10-CM | POA: Diagnosis not present

## 2017-07-19 DIAGNOSIS — R1013 Epigastric pain: Secondary | ICD-10-CM | POA: Diagnosis not present

## 2017-07-19 DIAGNOSIS — R002 Palpitations: Secondary | ICD-10-CM | POA: Diagnosis not present

## 2017-07-19 DIAGNOSIS — Z8249 Family history of ischemic heart disease and other diseases of the circulatory system: Secondary | ICD-10-CM | POA: Diagnosis not present

## 2017-07-19 DIAGNOSIS — E785 Hyperlipidemia, unspecified: Secondary | ICD-10-CM | POA: Diagnosis present

## 2017-07-19 DIAGNOSIS — I350 Nonrheumatic aortic (valve) stenosis: Secondary | ICD-10-CM | POA: Diagnosis not present

## 2017-07-19 DIAGNOSIS — E039 Hypothyroidism, unspecified: Secondary | ICD-10-CM | POA: Diagnosis not present

## 2017-07-19 DIAGNOSIS — I10 Essential (primary) hypertension: Secondary | ICD-10-CM | POA: Diagnosis not present

## 2017-07-19 DIAGNOSIS — R918 Other nonspecific abnormal finding of lung field: Secondary | ICD-10-CM | POA: Diagnosis not present

## 2017-07-19 DIAGNOSIS — I129 Hypertensive chronic kidney disease with stage 1 through stage 4 chronic kidney disease, or unspecified chronic kidney disease: Secondary | ICD-10-CM | POA: Diagnosis not present

## 2017-07-19 DIAGNOSIS — Z23 Encounter for immunization: Secondary | ICD-10-CM | POA: Diagnosis not present

## 2017-07-19 DIAGNOSIS — N183 Chronic kidney disease, stage 3 (moderate): Secondary | ICD-10-CM | POA: Diagnosis not present

## 2017-07-19 DIAGNOSIS — E079 Disorder of thyroid, unspecified: Secondary | ICD-10-CM | POA: Diagnosis not present

## 2017-07-19 DIAGNOSIS — N289 Disorder of kidney and ureter, unspecified: Secondary | ICD-10-CM | POA: Diagnosis not present

## 2017-07-19 DIAGNOSIS — I1310 Hypertensive heart and chronic kidney disease without heart failure, with stage 1 through stage 4 chronic kidney disease, or unspecified chronic kidney disease: Secondary | ICD-10-CM | POA: Diagnosis not present

## 2017-07-19 DIAGNOSIS — Z825 Family history of asthma and other chronic lower respiratory diseases: Secondary | ICD-10-CM | POA: Diagnosis not present

## 2017-07-19 DIAGNOSIS — I4891 Unspecified atrial fibrillation: Secondary | ICD-10-CM | POA: Diagnosis not present

## 2017-07-19 DIAGNOSIS — Z13220 Encounter for screening for lipoid disorders: Secondary | ICD-10-CM | POA: Diagnosis not present

## 2017-07-30 ENCOUNTER — Other Ambulatory Visit: Payer: Self-pay | Admitting: Rheumatology

## 2017-07-31 ENCOUNTER — Encounter: Payer: Self-pay | Admitting: *Deleted

## 2017-07-31 NOTE — Telephone Encounter (Signed)
He cannot fill prescription for her. She will have to get prescriptions from her PCP. Decentered discharge to the patient as she has missed several appointments.

## 2017-07-31 NOTE — Telephone Encounter (Signed)
Last Visit: 10/26/2015 Patient cancelled 12/07/16 appointment and no showed 01/04/17.  Patient treated with [name] for pyoderma gangrenosum, rheumatoid arthritis and osteoarthritis.  Do you want to refill prednisone?

## 2017-07-31 NOTE — Telephone Encounter (Signed)
Discharge letter sent.

## 2017-08-01 ENCOUNTER — Telehealth (INDEPENDENT_AMBULATORY_CARE_PROVIDER_SITE_OTHER): Payer: Self-pay | Admitting: Rheumatology

## 2017-08-01 NOTE — Telephone Encounter (Signed)
Dr. Deveshwar's discharge letter mailed "certified" today". °

## 2017-09-17 DIAGNOSIS — R7989 Other specified abnormal findings of blood chemistry: Secondary | ICD-10-CM | POA: Diagnosis not present

## 2017-09-17 DIAGNOSIS — I10 Essential (primary) hypertension: Secondary | ICD-10-CM | POA: Diagnosis not present

## 2017-09-17 DIAGNOSIS — G629 Polyneuropathy, unspecified: Secondary | ICD-10-CM | POA: Diagnosis not present

## 2017-09-17 DIAGNOSIS — I481 Persistent atrial fibrillation: Secondary | ICD-10-CM | POA: Diagnosis not present

## 2017-09-17 DIAGNOSIS — Z79899 Other long term (current) drug therapy: Secondary | ICD-10-CM | POA: Diagnosis not present

## 2017-09-17 DIAGNOSIS — Z23 Encounter for immunization: Secondary | ICD-10-CM | POA: Diagnosis not present

## 2017-09-17 DIAGNOSIS — R5381 Other malaise: Secondary | ICD-10-CM | POA: Diagnosis not present

## 2017-09-18 DIAGNOSIS — R7989 Other specified abnormal findings of blood chemistry: Secondary | ICD-10-CM | POA: Diagnosis present

## 2017-09-19 DIAGNOSIS — E611 Iron deficiency: Secondary | ICD-10-CM | POA: Insufficient documentation

## 2017-09-19 DIAGNOSIS — D509 Iron deficiency anemia, unspecified: Secondary | ICD-10-CM

## 2017-09-19 HISTORY — DX: Iron deficiency: E61.1

## 2017-09-19 HISTORY — DX: Iron deficiency anemia, unspecified: D50.9

## 2017-09-24 DIAGNOSIS — G629 Polyneuropathy, unspecified: Secondary | ICD-10-CM | POA: Diagnosis not present

## 2017-09-24 DIAGNOSIS — M25472 Effusion, left ankle: Secondary | ICD-10-CM | POA: Diagnosis not present

## 2017-09-24 DIAGNOSIS — M25471 Effusion, right ankle: Secondary | ICD-10-CM | POA: Diagnosis not present

## 2017-11-05 DIAGNOSIS — G629 Polyneuropathy, unspecified: Secondary | ICD-10-CM | POA: Diagnosis not present

## 2017-11-05 DIAGNOSIS — R600 Localized edema: Secondary | ICD-10-CM | POA: Diagnosis not present

## 2017-11-05 DIAGNOSIS — N183 Chronic kidney disease, stage 3 (moderate): Secondary | ICD-10-CM | POA: Diagnosis not present

## 2017-11-05 DIAGNOSIS — E079 Disorder of thyroid, unspecified: Secondary | ICD-10-CM | POA: Diagnosis not present

## 2017-11-05 DIAGNOSIS — R0602 Shortness of breath: Secondary | ICD-10-CM | POA: Diagnosis not present

## 2017-12-04 DIAGNOSIS — I481 Persistent atrial fibrillation: Secondary | ICD-10-CM | POA: Diagnosis not present

## 2017-12-04 DIAGNOSIS — F419 Anxiety disorder, unspecified: Secondary | ICD-10-CM | POA: Diagnosis not present

## 2017-12-04 DIAGNOSIS — E039 Hypothyroidism, unspecified: Secondary | ICD-10-CM | POA: Diagnosis not present

## 2017-12-04 DIAGNOSIS — I10 Essential (primary) hypertension: Secondary | ICD-10-CM | POA: Diagnosis not present

## 2017-12-04 DIAGNOSIS — K219 Gastro-esophageal reflux disease without esophagitis: Secondary | ICD-10-CM | POA: Diagnosis not present

## 2018-01-07 ENCOUNTER — Ambulatory Visit (INDEPENDENT_AMBULATORY_CARE_PROVIDER_SITE_OTHER): Payer: Medicare Other | Admitting: Nurse Practitioner

## 2018-01-07 ENCOUNTER — Encounter: Payer: Self-pay | Admitting: Nurse Practitioner

## 2018-01-07 VITALS — BP 146/88 | HR 73 | Temp 98.4°F | Ht 64.5 in | Wt 133.8 lb

## 2018-01-07 DIAGNOSIS — R739 Hyperglycemia, unspecified: Secondary | ICD-10-CM

## 2018-01-07 DIAGNOSIS — D649 Anemia, unspecified: Secondary | ICD-10-CM | POA: Diagnosis not present

## 2018-01-07 DIAGNOSIS — R600 Localized edema: Secondary | ICD-10-CM

## 2018-01-07 DIAGNOSIS — E039 Hypothyroidism, unspecified: Secondary | ICD-10-CM

## 2018-01-07 DIAGNOSIS — I4819 Other persistent atrial fibrillation: Secondary | ICD-10-CM

## 2018-01-07 DIAGNOSIS — I481 Persistent atrial fibrillation: Secondary | ICD-10-CM

## 2018-01-07 DIAGNOSIS — R202 Paresthesia of skin: Secondary | ICD-10-CM

## 2018-01-07 DIAGNOSIS — E782 Mixed hyperlipidemia: Secondary | ICD-10-CM

## 2018-01-07 DIAGNOSIS — I10 Essential (primary) hypertension: Secondary | ICD-10-CM

## 2018-01-07 DIAGNOSIS — N182 Chronic kidney disease, stage 2 (mild): Secondary | ICD-10-CM

## 2018-01-07 LAB — COMPREHENSIVE METABOLIC PANEL
ALT: 5 U/L (ref 0–35)
AST: 12 U/L (ref 0–37)
Albumin: 3.6 g/dL (ref 3.5–5.2)
Alkaline Phosphatase: 91 U/L (ref 39–117)
BUN: 15 mg/dL (ref 6–23)
CO2: 30 mEq/L (ref 19–32)
Calcium: 9.7 mg/dL (ref 8.4–10.5)
Chloride: 99 mEq/L (ref 96–112)
Creatinine, Ser: 0.96 mg/dL (ref 0.40–1.20)
GFR: 58.23 mL/min — ABNORMAL LOW (ref >60.00)
Glucose, Bld: 99 mg/dL (ref 70–99)
Potassium: 4.2 mEq/L (ref 3.5–5.1)
Sodium: 138 mEq/L (ref 135–145)
Total Bilirubin: 0.5 mg/dL (ref 0.2–1.2)
Total Protein: 7.1 g/dL (ref 6.0–8.3)

## 2018-01-07 LAB — CBC
HCT: 36.6 % (ref 36.0–46.0)
Hemoglobin: 12.1 g/dL (ref 12.0–15.0)
MCHC: 33 g/dL (ref 30.0–36.0)
MCV: 84.6 fl (ref 78.0–100.0)
Platelets: 364 10*3/uL (ref 150.0–400.0)
RBC: 4.32 Mil/uL (ref 3.87–5.11)
RDW: 15.6 % — ABNORMAL HIGH (ref 11.5–15.5)
WBC: 9.5 10*3/uL (ref 4.0–10.5)

## 2018-01-07 LAB — HEMOGLOBIN A1C: Hgb A1c MFr Bld: 6 % (ref 4.6–6.5)

## 2018-01-07 LAB — IBC PANEL
Iron: 28 ug/dL — ABNORMAL LOW (ref 42–145)
Saturation Ratios: 8.8 % — ABNORMAL LOW (ref 20.0–50.0)
Transferrin: 228 mg/dL (ref 212.0–360.0)

## 2018-01-07 MED ORDER — GABAPENTIN 100 MG PO CAPS: 100.0000 mg | ORAL_CAPSULE | Freq: Every day | ORAL | 0 refills | Status: DC

## 2018-01-07 NOTE — Progress Notes (Signed)
Subjective:  Patient ID: [name], female    DOB: 06/09/1927  Age: 84 y.o. MRN: [id number]  CC: Establish Care (discuss medication/ premarin and provera/xanax--for RLS) and Foot Pain (tingling in feet and legs)  HPI Accompanied by son Mrs Esselman is here to establish care. She is transferring fer care from Dr. Stann Mainland with [facility]. Last seen 12/04/2017. Sh has multiple complaints about her current help and medications.  1)Bilateral LE neuropathy: Onset 2014. She thinks this was as report of RA treatment. Unable to remember name of medication prescribed at the time. Describes as Numbness and tingling  Associates with insomnia, venous insufficiency and LE edema (evaluated in past by vascular clinic) Reports hx of venous ligation several years ago. Current use of gabepentin. (out of medication for over 63month). Gabapentin was helpful, but ran out of medication. Unable to tolerate compression stocking.  Insomnia: Due to LE neuropathy Sleep if fragmented due to pain. Chronic use of of xanax  A-Fib: Controlled rate. Reports fatigue with exertion (chronic per patient with no change) Denies any syncope, SOB, dizziness, chest pain, palpaitation. Denies any use of anticoagulation due to fear of possible side effects. Current use of ASA 81mg  only. Did not start Cardizem as prescribed 0205/2019 by previous pcp. Last seen by cardiology 2016 (Dr. Acie Fredrickson). Has hx of GI bleed with use of anticoagulant (11/2014). She was evaluated by Dr. Hilarie Fredrickson. She declined upper GI series and colonoscopy at the time. Last echocardiogram 2015: EF 60-65%, LVH, aortic stenosis Last carotid doppler 2015 (1-39% bilateral stenosis).  Outpatient Medications Prior to Visit  Medication Sig Dispense Refill  . ALPRAZolam (XANAX) 0.5 MG tablet     . aspirin 81 MG chewable tablet Chew 81 mg by mouth daily.    Marland Kitchen BENICAR 40 MG tablet Take 40 mg by mouth daily.    Marland Kitchen levothyroxine (SYNTHROID, LEVOTHROID) 75 MCG  tablet Take 75 mcg by mouth daily.      Marland Kitchen dicyclomine (BENTYL) 20 MG tablet Take 10 mg by mouth every 6 (six) hours as needed for spasms. For stomach irritability.    Marland Kitchen acetaminophen (TYLENOL) 500 MG tablet Take 1,000 mg by mouth every 6 (six) hours as needed (joint pain).     . calcium carbonate (OS-CAL) 600 MG TABS Take 1,800 mg by mouth daily.    . cyanocobalamin 500 MCG tablet Take 500 mcg by mouth daily.    Marland Kitchen diltiazem (CARDIZEM CD) 120 MG 24 hr capsule Take 1 capsule (120 mg total) by mouth daily.    Marland Kitchen estrogens, conjugated, (PREMARIN) 0.625 MG tablet Take 0.625 mg by mouth daily.     . furosemide (LASIX) 20 MG tablet Take 1 tablet (20 mg total) by mouth daily. (Patient not taking: Reported on 01/07/2018) 31 tablet 11  . metoprolol tartrate (LOPRESSOR) 25 MG tablet Take 1 tablet (25 mg total) by mouth 2 (two) times daily. (Patient not taking: Reported on 01/07/2018) 60 tablet 11  . olopatadine (PATANOL) 0.1 % ophthalmic solution Place 1 drop into both eyes 2 (two) times daily.    . Omega-3 Fatty Acids (FISH OIL) 1000 MG CAPS Take 1 capsule by mouth daily.     . RESTASIS 0.05 % ophthalmic emulsion Place 1 drop into both eyes 2 (two) times daily.    . vitamin E 400 UNIT capsule Take 400 Units by mouth daily.    Marland Kitchen apixaban (ELIQUIS) 5 MG TABS tablet Take 1 tablet (5 mg total) by mouth 2 (two) times daily. (Patient not taking: Reported  on 01/07/2018) 60 tablet 11  . diclofenac sodium (VOLTAREN) 1 % GEL Apply 2 g topically 4 (four) times daily as needed (For joint pain).     . medroxyPROGESTERone (PROVERA) 2.5 MG tablet Take 5 mg by mouth as directed. For 10 days each month 20th-30th    . predniSONE (DELTASONE) 5 MG tablet Take 2 tablets (10 mg total) by mouth daily with breakfast. (Patient not taking: Reported on 01/07/2018) 180 tablet 1   No facility-administered medications prior to visit.     ROS See HPI  Objective:  BP (!) 146/88   Pulse 73   Temp 98.4 F (36.9 C)   Ht 5' 4.5" (1.638  m)   Wt 133 lb 12.8 oz (60.7 kg)   SpO2 93%   BMI 22.61 kg/m   BP Readings from Last 3 Encounters:  01/07/18 (!) 146/88  09/06/15 (!) 174/114  07/29/15 132/72    Wt Readings from Last 3 Encounters:  01/07/18 133 lb 12.8 oz (60.7 kg)  09/06/15 159 lb 8 oz (72.3 kg)  07/29/15 163 lb 12.8 oz (74.3 kg)    Physical Exam  Constitutional: She is oriented to person, place, and time. No distress.  Cardiovascular: Normal rate and intact distal pulses. An irregularly irregular rhythm present.  Murmur heard. Bilateral LE edema. No carotid bruit   Pulmonary/Chest: Effort normal and breath sounds normal.  Musculoskeletal: She exhibits edema and deformity. She exhibits no tenderness.  Bilateral MTP joint deviation and hypertrophy. Bilateral DIP and PIP hypertrophy. No erythema, no joint effusion.  Neurological: She is alert and oriented to person, place, and time.  Vitals reviewed.  Lab Results  Component Value Date   WBC 9.5 01/07/2018   HGB 12.1 01/07/2018   HCT 36.6 01/07/2018   PLT 364.0 01/07/2018   GLUCOSE 99 01/07/2018   CHOL 240 (H) 07/29/2015   TRIG 220.0 (H) 07/29/2015   HDL 72.50 07/29/2015   LDLDIRECT 146.0 07/29/2015   LDLCALC 80 09/30/2012   ALT 5 01/07/2018   AST 12 01/07/2018   NA 138 01/07/2018   K 4.2 01/07/2018   CL 99 01/07/2018   CREATININE 0.96 01/07/2018   BUN 15 01/07/2018   CO2 30 01/07/2018   TSH 1.97 01/07/2018   INR 1.47 05/15/2014   HGBA1C 6.0 01/07/2018    Dg Ugi  W/kub  Result Date: 12/28/2014 CLINICAL DATA:  Increasing shortness of breath and pressure after starting arthritis medicine 2 months ago. History of chronic hiatal hernia. Initial encounter. EXAM: UPPER GI SERIES WITH KUB TECHNIQUE: After obtaining a scout radiograph a routine upper GI series was performed using thin and high density barium. FLUOROSCOPY TIME:  Radiation Exposure Index (as provided by the fluoroscopic device): If the device does not provide the exposure index:  Fluoroscopy Time (in minutes and seconds): 1 minutes and 48 seconds. Number of Acquired Images: 1 scout image. Two non fluoro-store series (18 images total). COMPARISON:  Chest radiographs 11/25/2014 and 07/02/2014. FINDINGS: The scout abdominal radiograph demonstrates a nonobstructive bowel gas pattern. There is stool throughout the colon and mild lumbar spondylosis. The patient swallowed the contrast without difficulty. The esophageal motility is within normal limits for age. There is a large hiatal hernia with the majority of the stomach above the diaphragm. Contrast passed freely through the diaphragmatic hiatus into the distal stomach and small bowel. There is no evidence of gastric wall thickening or erosion. The proximal small bowel appears normal. Mild reflux was elicited with the water siphon test. A 13 mm  barium tablet was administered and passed without delay into the stomach. IMPRESSION: 1. Large hiatal hernia without evidence of distal esophagitis or stricture. 2. Mild gastroesophageal reflux. Electronically Signed   By: Richardean Sale M.D.   On: 12/28/2014 11:33    Assessment & Plan:   Dasia was seen today for establish care and foot pain.  Diagnoses and all orders for this visit:  Essential hypertension -     CBC  Persistent atrial fibrillation (HCC)  Paresthesia of both lower extremities -     B12 and Folate Panel -     gabapentin (NEURONTIN) 100 MG capsule; Take 1-2 capsules (100-200 mg total) by mouth at bedtime.  Leg edema  Mixed hyperlipidemia -     TSH -     T4, free  Hypothyroidism, unspecified type  CKD (chronic kidney disease) stage 2, GFR 60-89 ml/min -     CBC -     Comprehensive metabolic panel  Anemia, unspecified type -     CBC -     B12 and Folate Panel -     IBC panel  Hyperglycemia -     Hemoglobin A1c   I have discontinued Deloria Lair. Tsui's dicyclomine, medroxyPROGESTERone, diclofenac sodium, apixaban, and predniSONE. I am also having her start  on gabapentin. Additionally, I am having her maintain her levothyroxine, calcium carbonate, acetaminophen, estrogens (conjugated), olopatadine, Fish Oil, cyanocobalamin, aspirin, vitamin E, BENICAR, RESTASIS, furosemide, metoprolol tartrate, ALPRAZolam, and diltiazem.  Meds ordered this encounter  Medications  . gabapentin (NEURONTIN) 100 MG capsule    Sig: Take 1-2 capsules (100-200 mg total) by mouth at bedtime.    Dispense:  60 capsule    Refill:  0    Order Specific Question:   Supervising Provider    Answer:   Lucille Passy [[id number]]    Follow-up: Return in about 2 weeks (around 01/21/2018) for neuropathy and HTN.  Wilfred Lacy, NP

## 2018-01-07 NOTE — Patient Instructions (Signed)
Go to lab for blood draw. You will be called with results.  resume gabapentin and Cardizem as prescribed.

## 2018-01-08 ENCOUNTER — Encounter: Payer: Self-pay | Admitting: Nurse Practitioner

## 2018-01-08 LAB — B12 AND FOLATE PANEL
Folate: 9 ng/mL (ref >5.9)
Vitamin B-12: 330 pg/mL (ref 211–911)

## 2018-01-08 LAB — T4, FREE: Free T4: 0.89 ng/dL (ref 0.60–1.60)

## 2018-01-08 LAB — TSH: TSH: 1.97 u[IU]/mL (ref 0.35–4.50)

## 2018-01-08 NOTE — Assessment & Plan Note (Signed)
Stable TSh and T4 with levothyroxine 84mcg

## 2018-01-08 NOTE — Assessment & Plan Note (Signed)
HgbA1c of 6.0. Continue to monitor. No medication at this time due to advanced age

## 2018-01-08 NOTE — Assessment & Plan Note (Signed)
Resume gabapentin 100-200mg  at HS. F/up in 2weeks.  Normal B12, and folate. Iron deficient anemia, resume ferrous sulfate. Stable TSH and T4, maintain levothyroxine 11mcg

## 2018-01-08 NOTE — Assessment & Plan Note (Signed)
Resume  Ferrous sulfate 325mg  BID with food. Discuss need for annual iFob vs colonoscopy?

## 2018-01-08 NOTE — Assessment & Plan Note (Signed)
BP Readings from Last 3 Encounters:  01/07/18 (!) 146/88  09/06/15 (!) 174/114  07/29/15 132/72   Advised to resume Cardizem as prescribed.

## 2018-01-08 NOTE — Assessment & Plan Note (Signed)
BMP Latest Ref Rng & Units 01/07/2018 07/29/2015 09/04/2014  Glucose 70 - 99 mg/dL 99 124(H) 97  BUN 6 - 23 mg/dL 15 29(H) 18  Creatinine 0.40 - 1.20 mg/dL 0.96 1.22(H) 1.09  Sodium 135 - 145 mEq/L 138 136 138  Potassium 3.5 - 5.1 mEq/L 4.2 4.5 4.7  Chloride 96 - 112 mEq/L 99 100 98  CO2 19 - 32 mEq/L 30 26 24   Calcium 8.4 - 10.5 mg/dL 9.7 9.4 9.5   Stable renal function.

## 2018-01-08 NOTE — Assessment & Plan Note (Addendum)
Controlled rate. Irregular rhythm on exam Reports fatigue with exertion (chronic per patient with no change) Denies any syncope, SOB, dizziness, chest pain, palpaitation. Denies any use of anticoagulation due to fear of possible side effects. Current use of ASA 81mg  only. Did not start Cardizem and metoprolol as prescribed 0205/2019 by previous pcp. Last seen by cardiology 2016 (Dr. Acie Fredrickson) Last echocardiogram 2015: EF 60-65%, LVH, aortic stenosis Last carotid doppler 2015 (1-39% bilateral stenosis).  Has hx of GI bleed (rectal bleed) with use of anticoagulant (11/2014). She was evaluated by Dr. Hilarie Fredrickson. She declined upper GI series and colonoscopy at the time.  Advised to resume Cardizem CD 120mg  as prescribed.

## 2018-01-10 ENCOUNTER — Telehealth: Payer: Self-pay | Admitting: Nurse Practitioner

## 2018-01-10 NOTE — Telephone Encounter (Signed)
Copied from [place]. Topic: Quick Communication - Lab Results >> Jan 08, 2018  1:15 PM Noitamyae, [place], LPN wrote: Called patient to inform them of 01/08/18 lab results. When patient returns call, triage nurse may disclose results. >> Jan 08, 2018  2:37 PM Noitamyae, [place], LPN wrote: Left vm for the pt to call back, need to inform of [name]'s note that the pt ask for her to speak to Dr. Hilarie Fredrickson. [place] for triage to give massage.

## 2018-01-21 ENCOUNTER — Ambulatory Visit: Payer: Medicare Other | Admitting: Nurse Practitioner

## 2018-01-25 DIAGNOSIS — R109 Unspecified abdominal pain: Secondary | ICD-10-CM | POA: Diagnosis not present

## 2018-02-06 ENCOUNTER — Ambulatory Visit (INDEPENDENT_AMBULATORY_CARE_PROVIDER_SITE_OTHER): Payer: Medicare Other | Admitting: Nurse Practitioner

## 2018-02-06 ENCOUNTER — Encounter: Payer: Self-pay | Admitting: Nurse Practitioner

## 2018-02-06 VITALS — BP 158/88 | HR 82 | Temp 98.4°F | Ht 64.5 in | Wt 133.0 lb

## 2018-02-06 DIAGNOSIS — S81819A Laceration without foreign body, unspecified lower leg, initial encounter: Secondary | ICD-10-CM | POA: Diagnosis not present

## 2018-02-06 DIAGNOSIS — R1084 Generalized abdominal pain: Secondary | ICD-10-CM | POA: Diagnosis not present

## 2018-02-06 DIAGNOSIS — R202 Paresthesia of skin: Secondary | ICD-10-CM | POA: Diagnosis not present

## 2018-02-06 DIAGNOSIS — G8929 Other chronic pain: Secondary | ICD-10-CM | POA: Diagnosis not present

## 2018-02-06 DIAGNOSIS — E039 Hypothyroidism, unspecified: Secondary | ICD-10-CM | POA: Diagnosis not present

## 2018-02-06 MED ORDER — GABAPENTIN 100 MG PO CAPS: 100.0000 mg | ORAL_CAPSULE | Freq: Every day | ORAL | 1 refills | Status: DC

## 2018-02-06 MED ORDER — LEVOTHYROXINE SODIUM 75 MCG PO TABS: 75.0000 ug | ORAL_TABLET | Freq: Every day | ORAL | 1 refills | Status: DC

## 2018-02-06 MED ORDER — DICYCLOMINE HCL 20 MG PO TABS: 10.0000 mg | ORAL_TABLET | Freq: Every day | ORAL | 1 refills | Status: DC | PRN
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
## 2018-02-06 NOTE — Patient Instructions (Addendum)
Please send mychart message with list of current medications. Include doses and directions on bottle.  Change dressing every 2days. Return to office sooner if any signs of infection.  Skin Tear Care A skin tear is a wound in which the top layer of skin peels off. To repair the skin, your doctor may use:  Tape.  Skin adhesive strips.  A bandage (dressing) may also be placed over the tape or skin adhesive strips. How to care for your skin tear  Clean the wound as told by your doctor. You may be told to keep the wound dry for the first few days. If you are told to clean the wound: ? Wash the wound with mild soap and water or a salt-water (saline) solution. ? Rinse the wound with water to remove all soap. ? Do not rub the wound dry. Let the wound air dry.  Change any dressings as told by your doctor. This includes changing the dressing if it gets wet, gets dirty, or starts to smell bad.  Do not scratch or pick at the wound.  Protect the injured area until it has healed.  Check your wound every day for signs of infection. Check for: ? More redness, swelling, or pain. ? More fluid or blood. ? Warmth. ? Pus or a bad smell. Medicines   Take over-the-counter and prescription medicines only as told by your doctor.  If you were prescribed an antibiotic medicine, take or apply it as told by your doctor. Do not stop using the antibiotic even if your condition gets better. General instructions  Keep the bandage dry as told by your doctor.  Do not take baths, swim, or do anything that puts your wound underwater until your doctor says it is okay.  Keep all follow-up visits as told by your doctor. This is important. Contact a doctor if:  You have more redness, swelling, or pain around your wound.  You have more fluid or blood coming from your wound.  Your wound feels warm to the touch.  You have pus or a bad smell coming from your wound. Get help right away if:  You have a red  streak that goes away from the skin tear.  You have a fever and chills and your symptoms suddenly get worse. This information is not intended to replace advice given to you by your health care provider. Make sure you discuss any questions you have with your health care provider. Document Released: 07/25/2008 Document Revised: 06/11/2016 Document Reviewed: 09/06/2015 Elsevier Interactive Patient Education  [employer].

## 2018-02-07 DIAGNOSIS — Z961 Presence of intraocular lens: Secondary | ICD-10-CM | POA: Diagnosis not present

## 2018-02-07 DIAGNOSIS — H353132 Nonexudative age-related macular degeneration, bilateral, intermediate dry stage: Secondary | ICD-10-CM | POA: Diagnosis not present

## 2018-02-07 DIAGNOSIS — H524 Presbyopia: Secondary | ICD-10-CM | POA: Diagnosis not present

## 2018-02-12 ENCOUNTER — Telehealth: Payer: Self-pay | Admitting: Nurse Practitioner

## 2018-02-12 DIAGNOSIS — I10 Essential (primary) hypertension: Secondary | ICD-10-CM

## 2018-02-12 MED ORDER — DILTIAZEM HCL ER COATED BEADS 120 MG PO CP24: 120.0000 mg | ORAL_CAPSULE | Freq: Every day | ORAL | 0 refills | Status: DC

## 2018-02-12 NOTE — Telephone Encounter (Signed)
Contacted pt's son and he states that they were told by the [facility] at [address] that they had to contact the MD's office to transfer her cardizem prescription to [facility] on [address]

## 2018-02-12 NOTE — Telephone Encounter (Signed)
Copied from [place] [id number]. Topic: Quick Communication - Rx Refill/Question >> Feb 12, 2018 10:06 AM Synthia Innocent wrote: Medication: diltiazem (CARDIZEM CD) 120 MG 24 hr capsule  Has the patient contacted their pharmacy? Yes.  Pharmacy change (Agent: If no, request that the patient contact the pharmacy for the refill.) Preferred Pharmacy (with phone number or street name): [facility] on [address] Agent: Please be advised that RX refills may take up to 3 business days. We ask that you follow-up with your pharmacy.

## 2018-03-14 ENCOUNTER — Other Ambulatory Visit: Payer: Self-pay | Admitting: Nurse Practitioner

## 2018-03-14 DIAGNOSIS — I10 Essential (primary) hypertension: Secondary | ICD-10-CM

## 2018-03-19 ENCOUNTER — Telehealth: Payer: Self-pay | Admitting: Nurse Practitioner

## 2018-03-19 NOTE — Telephone Encounter (Signed)
Please advise, we never send in this rx before.

## 2018-03-19 NOTE — Telephone Encounter (Signed)
Refill request for Xanax 0.5mg , last filled by historical provider Pt states she would like the prescription for leg pain.     LOV: 02/06/18 [facility] [address]

## 2018-03-19 NOTE — Telephone Encounter (Signed)
Copied from [place] [id number]. Topic: Quick Communication - Rx Refill/Question >> Mar 19, 2018  8:25 AM Robina Ade, Helene Kelp D wrote: Medication:ALPRAZolam Duanne Moron) 0.5 MG tablet  Has the patient contacted their pharmacy? Yes, would like for leg pain. (Agent: If no, request that the patient contact the pharmacy for the refill.) (Agent: If yes, when and what did the pharmacy advise?)  Preferred Pharmacy (with phone number or street name): [place], [place].  Agent: Please be advised that RX refills may take up to 3 business days. We ask that you follow-up with your pharmacy.

## 2018-03-19 NOTE — Telephone Encounter (Signed)
I will not be refilling xanax at this time. Based on last OV, I prescribed gabapentin for leg pain, and she was to stop xanax use.

## 2018-03-20 NOTE — Telephone Encounter (Signed)
Pt stated gabapentin 2 cap at bedtime is making her have vertigo. Pt [place], take 1 cap at bedtime.   Pt verbalized understand.

## 2018-04-30 ENCOUNTER — Ambulatory Visit (INDEPENDENT_AMBULATORY_CARE_PROVIDER_SITE_OTHER): Payer: Medicare Other | Admitting: Nurse Practitioner

## 2018-04-30 ENCOUNTER — Encounter: Payer: Self-pay | Admitting: Nurse Practitioner

## 2018-04-30 VITALS — BP 150/84 | HR 96 | Temp 98.9°F | Ht 64.5 in | Wt 134.0 lb

## 2018-04-30 DIAGNOSIS — I10 Essential (primary) hypertension: Secondary | ICD-10-CM

## 2018-04-30 DIAGNOSIS — D649 Anemia, unspecified: Secondary | ICD-10-CM

## 2018-04-30 DIAGNOSIS — M792 Neuralgia and neuritis, unspecified: Secondary | ICD-10-CM

## 2018-04-30 MED ORDER — OLMESARTAN MEDOXOMIL 20 MG PO TABS: 20.0000 mg | ORAL_TABLET | Freq: Every day | ORAL | 0 refills | Status: DC

## 2018-04-30 MED ORDER — VITAMIN D 1000 UNITS PO TABS: 1000.0000 [IU] | ORAL_TABLET | Freq: Every day | ORAL | Status: DC

## 2018-04-30 MED ORDER — FOLIC ACID 400 MCG PO TABS: 400.0000 ug | ORAL_TABLET | Freq: Every day | ORAL | Status: DC

## 2018-04-30 MED ORDER — CYANOCOBALAMIN 500 MCG PO TABS: 500.0000 ug | ORAL_TABLET | Freq: Every day | ORAL | Status: DC

## 2018-04-30 MED ORDER — PREGABALIN 25 MG PO CAPS: 25.0000 mg | ORAL_CAPSULE | Freq: Two times a day (BID) | ORAL | 0 refills | Status: DC

## 2018-04-30 MED ORDER — DILTIAZEM HCL ER COATED BEADS 120 MG PO CP24: 120.0000 mg | ORAL_CAPSULE | Freq: Every day | ORAL | 1 refills | Status: DC

## 2018-04-30 MED ORDER — CAPSICUM OLEORESIN 0.025 % EX CREA: 1.0000 "application " | TOPICAL_CREAM | Freq: Two times a day (BID) | CUTANEOUS | 0 refills | Status: DC

## 2018-04-30 MED ORDER — FERROUS SULFATE 325 (65 FE) MG PO TABS: 325.0000 mg | ORAL_TABLET | Freq: Two times a day (BID) | ORAL | 3 refills | Status: DC

## 2018-04-30 NOTE — Patient Instructions (Addendum)
Bring all medications and BP machine to next office visit.   Neuropathic Pain Neuropathic pain is pain caused by damage to the nerves that are responsible for certain sensations in your body (sensory nerves). The pain can be caused by damage to:  The sensory nerves that send signals to your spinal cord and brain (peripheral nervous system).  The sensory nerves in your brain or spinal cord (central nervous system).  Neuropathic pain can make you more sensitive to pain. What would be a minor sensation for most people may feel very painful if you have neuropathic pain. This is usually a long-term condition that can be difficult to treat. The type of pain can differ from person to person. It may start suddenly (acute), or it may develop slowly and last for a long time (chronic). Neuropathic pain may come and go as damaged nerves heal or may stay at the same level for years. It often causes emotional distress, loss of sleep, and a lower quality of life. What are the causes? The most common cause of damage to a sensory nerve is diabetes. Many other diseases and conditions can also cause neuropathic pain. Causes of neuropathic pain can be classified as:  Toxic. Many drugs and chemicals can cause toxic damage. The most common cause of toxic neuropathic pain is damage from drug treatment for cancer (chemotherapy).  Metabolic. This type of pain can happen when a disease causes imbalances that damage nerves. Diabetes is the most common of these diseases. Vitamin B deficiency caused by long-term alcohol abuse is another common cause.  Traumatic. Any injury that cuts, crushes, or stretches a nerve can cause damage and pain. A common example is feeling pain after losing an arm or leg (phantom limb pain).  Compression-related. If a sensory nerve gets trapped or compressed for a long period of time, the blood supply to the nerve can be cut off.  Vascular. Many blood vessel diseases can cause neuropathic pain  by decreasing blood supply and oxygen to nerves.  Autoimmune. This type of pain results from diseases in which the body's defense system mistakenly attacks sensory nerves. Examples of autoimmune diseases that can cause neuropathic pain include lupus and multiple sclerosis.  Infectious. Many types of viral infections can damage sensory nerves and cause pain. Shingles infection is a common cause of this type of pain.  Inherited. Neuropathic pain can be a symptom of many diseases that are passed down through families (genetic).  What are the signs or symptoms? The main symptom is pain. Neuropathic pain is often described as:  Burning.  Shock-like.  Stinging.  Hot or cold.  Itching.  How is this diagnosed? No single test can diagnose neuropathic pain. Your health care provider will do a physical exam and ask you about your pain. You may use a pain scale to describe how bad your pain is. You may also have tests to see if you have a high sensitivity to pain and to help find the cause and location of any sensory nerve damage. These tests may include:  Imaging studies, such as: ? X-rays. ? CT scan. ? MRI.  Nerve conduction studies to test how well nerve signals travel through your sensory nerves (electrodiagnostic testing).  Stimulating your sensory nerves through electrodes on your skin and measuring the response in your spinal cord and brain (somatosensory evoked potentials).  How is this treated? Treatment for neuropathic pain may change over time. You may need to try different treatment options or a combination of treatments.  Some options include:  Over-the-counter pain relievers.  Prescription medicines. Some medicines used to treat other conditions may also help neuropathic pain. These include medicines to: ? Control seizures (anticonvulsants). ? Relieve depression (antidepressants).  Prescription-strength pain relievers (narcotics). These are usually used when other pain  relievers do not help.  Transcutaneous nerve stimulation (TENS). This uses electrical currents to block painful nerve signals. The treatment is painless.  Topical and local anesthetics. These are medicines that numb the nerves. They can be injected as a nerve block or applied to the skin.  Alternative treatments, such as: ? Acupuncture. ? Meditation. ? Massage. ? Physical therapy. ? Pain management programs. ? Counseling.  Follow these instructions at home:  Learn as much as you can about your condition.  Take medicines only as directed by your health care provider.  Work closely with all your health care providers to find what works best for you.  Have a good support system at home.  Consider joining a chronic pain support group. Contact a health care provider if:  Your pain treatments are not helping.  You are having side effects from your medicines.  You are struggling with fatigue, mood changes, depression, or anxiety. This information is not intended to replace advice given to you by your health care provider. Make sure you discuss any questions you have with your health care provider. Document Released: 07/13/2004 Document Revised: 05/05/2016 Document Reviewed: 03/26/2014 Elsevier Interactive Patient Education  [employer].

## 2018-04-30 NOTE — Progress Notes (Signed)
Subjective:  Patient ID: [name], female    DOB: 1927-04-26  Age: 84 y.o. MRN: [id number]  CC: Follow-up (HTN and neuropathy/cant sleep-legs still painful/ B12 inj?)  HPI  HTN: Uncontrolled. Use of cartia only. BP Readings from Last 3 Encounters:  04/30/18 (!) 150/84  02/06/18 (!) 158/88  01/07/18 (!) 146/88   Neuropathy: Uncontrolled with 1cap of gabapentin, but 2caps causes vertigo. Describes pain as burning sensation.  Reviewed [place] today.  Outpatient Medications Prior to Visit  Medication Sig Dispense Refill  . acetaminophen (TYLENOL) 500 MG tablet Take 1,000 mg by mouth every 6 (six) hours as needed (joint pain).     Marland Kitchen aspirin 81 MG chewable tablet Chew 81 mg by mouth daily.    . calcium carbonate (OS-CAL) 600 MG TABS Take 1,800 mg by mouth daily.    Marland Kitchen dicyclomine (BENTYL) 20 MG tablet Take 0.5 tablets (10 mg total) by mouth daily as needed for spasms. 30 tablet 1  . levothyroxine (SYNTHROID, LEVOTHROID) 75 MCG tablet Take 1 tablet (75 mcg total) by mouth daily before breakfast. 90 tablet 1  . metoprolol tartrate (LOPRESSOR) 25 MG tablet Take 1 tablet (25 mg total) by mouth 2 (two) times daily. 60 tablet 11  . olopatadine (PATANOL) 0.1 % ophthalmic solution Place 1 drop into both eyes 2 (two) times daily.    . RESTASIS 0.05 % ophthalmic emulsion Place 1 drop into both eyes 2 (two) times daily.    Marland Kitchen CARTIA XT 120 MG 24 hr capsule TAKE 1 CAPSULE BY MOUTH ONCE DAILY 90 capsule 0  . gabapentin (NEURONTIN) 100 MG capsule Take 1-2 capsules (100-200 mg total) by mouth at bedtime. 180 capsule 1  . Omega-3 Fatty Acids (FISH OIL) 1000 MG CAPS Take 1 capsule by mouth daily.     . vitamin E 400 UNIT capsule Take 400 Units by mouth daily.    Marland Kitchen ALPRAZolam (XANAX) 0.5 MG tablet     . BENICAR 40 MG tablet Take 40 mg by mouth daily.    . cyanocobalamin 500 MCG tablet Take 500 mcg by mouth daily.    Marland Kitchen estrogens, conjugated, (PREMARIN) 0.625 MG tablet Take 0.625 mg by mouth  daily.     . furosemide (LASIX) 20 MG tablet Take 1 tablet (20 mg total) by mouth daily. (Patient not taking: Reported on 04/30/2018) 31 tablet 11   No facility-administered medications prior to visit.     ROS See HPI  Objective:  BP (!) 150/84   Pulse 96   Temp 98.9 F (37.2 C) (Oral)   Ht 5' 4.5" (1.638 m)   Wt 134 lb (60.8 kg)   SpO2 98%   BMI 22.65 kg/m   BP Readings from Last 3 Encounters:  04/30/18 (!) 150/84  02/06/18 (!) 158/88  01/07/18 (!) 146/88    Wt Readings from Last 3 Encounters:  04/30/18 134 lb (60.8 kg)  02/06/18 133 lb (60.3 kg)  01/07/18 133 lb 12.8 oz (60.7 kg)    Physical Exam  Constitutional: She is oriented to person, place, and time. No distress.  Cardiovascular: Normal rate and intact distal pulses.  LE varicose veins. Mild LE edema. Non pitting  Pulmonary/Chest: Effort normal.  Musculoskeletal: She exhibits edema.  Neurological: She is alert and oriented to person, place, and time.  Skin: Skin is warm and dry. No erythema.  Vitals reviewed.   Lab Results  Component Value Date   WBC 9.5 01/07/2018   HGB 12.1 01/07/2018   HCT 36.6 01/07/2018  PLT 364.0 01/07/2018   GLUCOSE 99 01/07/2018   CHOL 240 (H) 07/29/2015   TRIG 220.0 (H) 07/29/2015   HDL 72.50 07/29/2015   LDLDIRECT 146.0 07/29/2015   LDLCALC 80 09/30/2012   ALT 5 01/07/2018   AST 12 01/07/2018   NA 138 01/07/2018   K 4.2 01/07/2018   CL 99 01/07/2018   CREATININE 0.96 01/07/2018   BUN 15 01/07/2018   CO2 30 01/07/2018   TSH 1.97 01/07/2018   INR 1.47 05/15/2014   HGBA1C 6.0 01/07/2018    Dg Ugi  W/kub  Result Date: 12/28/2014 CLINICAL DATA:  Increasing shortness of breath and pressure after starting arthritis medicine 2 months ago. History of chronic hiatal hernia. Initial encounter. EXAM: UPPER GI SERIES WITH KUB TECHNIQUE: After obtaining a scout radiograph a routine upper GI series was performed using thin and high density barium. FLUOROSCOPY TIME:  Radiation  Exposure Index (as provided by the fluoroscopic device): If the device does not provide the exposure index: Fluoroscopy Time (in minutes and seconds): 1 minutes and 48 seconds. Number of Acquired Images: 1 scout image. Two non fluoro-store series (18 images total). COMPARISON:  Chest radiographs 11/25/2014 and 07/02/2014. FINDINGS: The scout abdominal radiograph demonstrates a nonobstructive bowel gas pattern. There is stool throughout the colon and mild lumbar spondylosis. The patient swallowed the contrast without difficulty. The esophageal motility is within normal limits for age. There is a large hiatal hernia with the majority of the stomach above the diaphragm. Contrast passed freely through the diaphragmatic hiatus into the distal stomach and small bowel. There is no evidence of gastric wall thickening or erosion. The proximal small bowel appears normal. Mild reflux was elicited with the water siphon test. A 13 mm barium tablet was administered and passed without delay into the stomach. IMPRESSION: 1. Large hiatal hernia without evidence of distal esophagitis or stricture. 2. Mild gastroesophageal reflux. Electronically Signed   By: Richardean Sale M.D.   On: 12/28/2014 11:33    Assessment & Plan:   Sher[place] was seen today for follow-up.  Diagnoses and all orders for this visit:  Neuropathic pain -     pregabalin (LYRICA) 25 MG capsule; Take 1 capsule (25 mg total) by mouth 2 (two) times daily. -     capsicum oleoresin (TRIXAICIN) 0.025 % cream; Apply 1 application topically 2 (two) times daily. -     cholecalciferol (VITAMIN D) 1000 units tablet; Take 1 tablet (1,000 Units total) by mouth daily.  Essential hypertension -     olmesartan (BENICAR) 20 MG tablet; Take 1 tablet (20 mg total) by mouth daily. -     diltiazem (CARTIA XT) 120 MG 24 hr capsule; Take 1 capsule (120 mg total) by mouth daily.  Anemia, unspecified type -     ferrous sulfate 325 (65 FE) MG tablet; Take 1 tablet (325 mg  total) by mouth 2 (two) times daily with a meal. -     folic acid (FOLVITE) 161 MCG tablet; Take 1 tablet (400 mcg total) by mouth daily. -     vitamin B-12 (CYANOCOBALAMIN) 500 MCG tablet; Take 1 tablet (500 mcg total) by mouth daily.   I have discontinued Deloria Lair. Fruin's estrogens (conjugated), BENICAR, furosemide, ALPRAZolam, and gabapentin. I have changed her CARTIA XT to diltiazem. I have also changed her vitamin B-12. Additionally, I am having her start on olmesartan, pregabalin, capsicum oleoresin, ferrous sulfate, cholecalciferol, and folic acid. Lastly, I am having her maintain her calcium carbonate, acetaminophen, olopatadine, Fish Oil,  aspirin, vitamin E, RESTASIS, metoprolol tartrate, dicyclomine, and levothyroxine.  Meds ordered this encounter  Medications  . olmesartan (BENICAR) 20 MG tablet    Sig: Take 1 tablet (20 mg total) by mouth daily.    Dispense:  30 tablet    Refill:  0    Order Specific Question:   Supervising Provider    Answer:   Lucille Passy [[id number]]  . diltiazem (CARTIA XT) 120 MG 24 hr capsule    Sig: Take 1 capsule (120 mg total) by mouth daily.    Dispense:  90 capsule    Refill:  1    Please consider 90 day supplies to promote better adherence    Order Specific Question:   Supervising Provider    Answer:   Lucille Passy [[id number]]  . pregabalin (LYRICA) 25 MG capsule    Sig: Take 1 capsule (25 mg total) by mouth 2 (two) times daily.    Dispense:  30 capsule    Refill:  0    Order Specific Question:   Supervising Provider    Answer:   Lucille Passy [[id number]]  . capsicum oleoresin (TRIXAICIN) 0.025 % cream    Sig: Apply 1 application topically 2 (two) times daily.    Dispense:  56.6 g    Refill:  0    Order Specific Question:   Supervising Provider    Answer:   Lucille Passy [[id number]]  . ferrous sulfate 325 (65 FE) MG tablet    Sig: Take 1 tablet (325 mg total) by mouth 2 (two) times daily with a meal.    Refill:  3    Order Specific Question:   Supervising  Provider    Answer:   Lucille Passy [[id number]]  . cholecalciferol (VITAMIN D) 1000 units tablet    Sig: Take 1 tablet (1,000 Units total) by mouth daily.    Order Specific Question:   Supervising Provider    Answer:   Lucille Passy [[id number]]  . folic acid (FOLVITE) 858 MCG tablet    Sig: Take 1 tablet (400 mcg total) by mouth daily.    Order Specific Question:   Supervising Provider    Answer:   Lucille Passy [[id number]]  . vitamin B-12 (CYANOCOBALAMIN) 500 MCG tablet    Sig: Take 1 tablet (500 mcg total) by mouth daily.    Order Specific Question:   Supervising Provider    Answer:   Lucille Passy [[id number]]    Follow-up: Return in about 2 weeks (around 05/14/2018) for neuropathy and HTN.  Wilfred Lacy, NP

## 2018-05-08 ENCOUNTER — Ambulatory Visit: Payer: Self-pay | Admitting: Nurse Practitioner

## 2018-05-14 ENCOUNTER — Ambulatory Visit: Payer: Self-pay | Admitting: Nurse Practitioner

## 2018-05-22 ENCOUNTER — Encounter: Payer: Self-pay | Admitting: Nurse Practitioner

## 2018-05-22 ENCOUNTER — Ambulatory Visit (INDEPENDENT_AMBULATORY_CARE_PROVIDER_SITE_OTHER): Payer: Medicare Other | Admitting: Nurse Practitioner

## 2018-05-22 VITALS — BP 144/82 | HR 92 | Temp 99.0°F | Ht 64.5 in | Wt 132.0 lb

## 2018-05-22 DIAGNOSIS — M792 Neuralgia and neuritis, unspecified: Secondary | ICD-10-CM | POA: Diagnosis not present

## 2018-05-22 DIAGNOSIS — R202 Paresthesia of skin: Secondary | ICD-10-CM

## 2018-05-22 DIAGNOSIS — N182 Chronic kidney disease, stage 2 (mild): Secondary | ICD-10-CM

## 2018-05-22 DIAGNOSIS — D649 Anemia, unspecified: Secondary | ICD-10-CM | POA: Diagnosis not present

## 2018-05-22 DIAGNOSIS — I10 Essential (primary) hypertension: Secondary | ICD-10-CM

## 2018-05-22 MED ORDER — FERROUS SULFATE 325 (65 FE) MG PO TABS: 325.0000 mg | ORAL_TABLET | Freq: Every day | ORAL | 3 refills | Status: DC

## 2018-05-22 MED ORDER — GABAPENTIN 100 MG PO CAPS: 100.0000 mg | ORAL_CAPSULE | Freq: Every day | ORAL | 5 refills | Status: DC

## 2018-05-22 MED ORDER — OLMESARTAN MEDOXOMIL 20 MG PO TABS: 20.0000 mg | ORAL_TABLET | Freq: Every day | ORAL | 1 refills | Status: DC

## 2018-05-22 NOTE — Assessment & Plan Note (Signed)
Onset 2015. Start as left foot pain to left leg pain to bilateral LE pain (described as burning sensation, worse at night) She thinks pain is due to Remicade administered by Rheumatology (Dr. Trudie Reed) due to diagnosis of rheumatoid arthritis. Hx of significant saphenous vein reflux, s/p venous ablation by Dr. Donnetta Hutching 2013 Hx of venous ulcers, treated by Dr. Donnetta Hutching. Evaluated by podiatry, rheumatology, infectious disease, vein and vascular specialist. Reports pain is limiting ADLs and interferring with sleep. unable to tolerate lyrica (nausea). She does not want to use any other medication due to fear of possible side effects. She will like to resume gabapentin at HS despite side effects (dizziness). She also declined referral to neurology.

## 2018-05-22 NOTE — Patient Instructions (Signed)
You will be taking benicar and diltiazem for hypertension.  You can resume gabapentin for neuropathy. Let me know if you decide to use elavil (nortriptyline) for leg pain.

## 2018-05-22 NOTE — Progress Notes (Signed)
Subjective:  Patient ID: [name], female    DOB: May 06, 1927  Age: 84 y.o. MRN: [id number]  CC: Follow-up (2 wk follow up. lyrica is not helping at all,legs painful, it is making her sick. Neuropathy consult? FYI bone density nto schedule yet, prev 13?)  HPI   Accompanied by son. She report she has stopped driving.  HTN: Not at goal. Reports pharmacy did not dispense benicar. Current use of diltiazem only BP Readings from Last 3 Encounters:  05/22/18 (!) 144/82  04/30/18 (!) 150/84  02/06/18 (!) 158/88   Anemia: unable to tolerate ferrous sulfate due to nausea and ABD pain.  Neuropathy: Onset 2015. Start as left foot pain to left leg pain to bilateral LE pain (described as burning sensation, worse at night) She thinks pain is due to Remicade administered by Rheumatology (Dr. Trudie Reed) due to diagnosis of rheumatoid arthritis. Evaluated by podiatry, rheumatology, infectious disease, vein and vascular specialist. Reports pain is limiting ADLs and interferring with sleep. unable to tolerate lyrica (nausea). She does not want to use any other medication due to fear of possible side effects. She will like to resume gabapentin at HS despite side effects (dizziness). She also declined referral to neurology. Hx of significant saphenous vein reflux, s/p venous ablation by Dr. Donnetta Hutching 2013 Hx of venous ulcers, treated by Dr. Donnetta Hutching.  Reviewed past Medical, Social and Family history today.  Outpatient Medications Prior to Visit  Medication Sig Dispense Refill  . acetaminophen (TYLENOL) 500 MG tablet Take 1,000 mg by mouth every 6 (six) hours as needed (joint pain).     Marland Kitchen aspirin 81 MG chewable tablet Chew 81 mg by mouth daily.    Marland Kitchen b complex vitamins tablet Take 1 tablet by mouth daily.    Marland Kitchen dicyclomine (BENTYL) 20 MG tablet Take 0.5 tablets (10 mg total) by mouth daily as needed for spasms. 30 tablet 1  . diltiazem (CARTIA XT) 120 MG 24 hr capsule Take 1 capsule (120 mg total) by  mouth daily. 90 capsule 1  . levothyroxine (SYNTHROID, LEVOTHROID) 75 MCG tablet Take 1 tablet (75 mcg total) by mouth daily before breakfast. 90 tablet 1  . OVER THE COUNTER MEDICATION Take 120 mg by mouth 2 (two) times daily. [place] 2    . olmesartan (BENICAR) 20 MG tablet Take 1 tablet (20 mg total) by mouth daily. 30 tablet 0  . calcium carbonate (OS-CAL) 600 MG TABS Take 1,800 mg by mouth daily.    . capsicum oleoresin (TRIXAICIN) 0.025 % cream Apply 1 application topically 2 (two) times daily. (Patient not taking: Reported on 05/22/2018) 56.6 g 0  . cholecalciferol (VITAMIN D) 1000 units tablet Take 1 tablet (1,000 Units total) by mouth daily. (Patient not taking: Reported on 05/22/2018)    . metoprolol tartrate (LOPRESSOR) 25 MG tablet Take 1 tablet (25 mg total) by mouth 2 (two) times daily. (Patient not taking: Reported on 05/22/2018) 60 tablet 11  . olopatadine (PATANOL) 0.1 % ophthalmic solution Place 1 drop into both eyes 2 (two) times daily.    . Omega-3 Fatty Acids (FISH OIL) 1000 MG CAPS Take 1 capsule by mouth daily.     . RESTASIS 0.05 % ophthalmic emulsion Place 1 drop into both eyes 2 (two) times daily.    . vitamin E 400 UNIT capsule Take 400 Units by mouth daily.    . ferrous sulfate 325 (65 FE) MG tablet Take 1 tablet (325 mg total) by mouth 2 (two) times daily with a  meal. (Patient not taking: Reported on 05/22/2018)  3  . folic acid (FOLVITE) 016 MCG tablet Take 1 tablet (400 mcg total) by mouth daily. (Patient not taking: Reported on 05/22/2018)    . pregabalin (LYRICA) 25 MG capsule Take 1 capsule (25 mg total) by mouth 2 (two) times daily. (Patient not taking: Reported on 05/22/2018) 30 capsule 0  . vitamin B-12 (CYANOCOBALAMIN) 500 MCG tablet Take 1 tablet (500 mcg total) by mouth daily. (Patient not taking: Reported on 05/22/2018)     No facility-administered medications prior to visit.     ROS See HPI  Objective:  BP (!) 144/82   Pulse 92   Temp 99 F (37.2 C) (Oral)    Ht 5' 4.5" (1.638 m)   Wt 132 lb (59.9 kg)   SpO2 99%   BMI 22.31 kg/m   BP Readings from Last 3 Encounters:  05/22/18 (!) 144/82  04/30/18 (!) 150/84  02/06/18 (!) 158/88    Wt Readings from Last 3 Encounters:  05/22/18 132 lb (59.9 kg)  04/30/18 134 lb (60.8 kg)  02/06/18 133 lb (60.3 kg)   Physical Exam  Constitutional: She is oriented to person, place, and time. No distress.  Cardiovascular: Normal rate and intact distal pulses.  Pulmonary/Chest: Effort normal.  Musculoskeletal: She exhibits edema.  Neurological: She is alert and oriented to person, place, and time.  Skin: Skin is dry. No rash noted. No erythema.  Psychiatric: Thought content normal.  Vitals reviewed.  Lab Results  Component Value Date   WBC 9.5 01/07/2018   HGB 12.1 01/07/2018   HCT 36.6 01/07/2018   PLT 364.0 01/07/2018   GLUCOSE 99 01/07/2018   CHOL 240 (H) 07/29/2015   TRIG 220.0 (H) 07/29/2015   HDL 72.50 07/29/2015   LDLDIRECT 146.0 07/29/2015   LDLCALC 80 09/30/2012   ALT 5 01/07/2018   AST 12 01/07/2018   NA 138 01/07/2018   K 4.2 01/07/2018   CL 99 01/07/2018   CREATININE 0.96 01/07/2018   BUN 15 01/07/2018   CO2 30 01/07/2018   TSH 1.97 01/07/2018   INR 1.47 05/15/2014   HGBA1C 6.0 01/07/2018    Dg Ugi  W/kub  Result Date: 12/28/2014 CLINICAL DATA:  Increasing shortness of breath and pressure after starting arthritis medicine 2 months ago. History of chronic hiatal hernia. Initial encounter. EXAM: UPPER GI SERIES WITH KUB TECHNIQUE: After obtaining a scout radiograph a routine upper GI series was performed using thin and high density barium. FLUOROSCOPY TIME:  Radiation Exposure Index (as provided by the fluoroscopic device): If the device does not provide the exposure index: Fluoroscopy Time (in minutes and seconds): 1 minutes and 48 seconds. Number of Acquired Images: 1 scout image. Two non fluoro-store series (18 images total). COMPARISON:  Chest radiographs 11/25/2014 and  07/02/2014. FINDINGS: The scout abdominal radiograph demonstrates a nonobstructive bowel gas pattern. There is stool throughout the colon and mild lumbar spondylosis. The patient swallowed the contrast without difficulty. The esophageal motility is within normal limits for age. There is a large hiatal hernia with the majority of the stomach above the diaphragm. Contrast passed freely through the diaphragmatic hiatus into the distal stomach and small bowel. There is no evidence of gastric wall thickening or erosion. The proximal small bowel appears normal. Mild reflux was elicited with the water siphon test. A 13 mm barium tablet was administered and passed without delay into the stomach. IMPRESSION: 1. Large hiatal hernia without evidence of distal esophagitis or stricture. 2. Mild  gastroesophageal reflux. Electronically Signed   By: Richardean Sale M.D.   On: 12/28/2014 11:33    Assessment & Plan:   [place] was seen today for follow-up.  Diagnoses and all orders for this visit:  Paresthesia of both lower extremities -     gabapentin (NEURONTIN) 100 MG capsule; Take 1 capsule (100 mg total) by mouth at bedtime.  CKD (chronic kidney disease) stage 2, GFR 60-89 ml/min  Neuropathic pain -     gabapentin (NEURONTIN) 100 MG capsule; Take 1 capsule (100 mg total) by mouth at bedtime.  Anemia, unspecified type -     ferrous sulfate 325 (65 FE) MG tablet; Take 1 tablet (325 mg total) by mouth daily with breakfast.  Essential hypertension -     olmesartan (BENICAR) 20 MG tablet; Take 1 tablet (20 mg total) by mouth daily.   I have discontinued Sydney Lair. Mcdonald's pregabalin, folic acid, and vitamin B-12. I have also changed her ferrous sulfate. Additionally, I am having her start on gabapentin. Lastly, I am having her maintain her calcium carbonate, acetaminophen, olopatadine, Fish Oil, aspirin, vitamin E, RESTASIS, metoprolol tartrate, dicyclomine, levothyroxine, diltiazem, capsicum oleoresin,  cholecalciferol, b complex vitamins, OVER THE COUNTER MEDICATION, and olmesartan.  Meds ordered this encounter  Medications  . ferrous sulfate 325 (65 FE) MG tablet    Sig: Take 1 tablet (325 mg total) by mouth daily with breakfast.    Refill:  3    Order Specific Question:   Supervising Provider    Answer:   Lucille Passy [[id number]]  . gabapentin (NEURONTIN) 100 MG capsule    Sig: Take 1 capsule (100 mg total) by mouth at bedtime.    Dispense:  30 capsule    Refill:  5    Order Specific Question:   Supervising Provider    Answer:   Lucille Passy [[id number]]  . olmesartan (BENICAR) 20 MG tablet    Sig: Take 1 tablet (20 mg total) by mouth daily.    Dispense:  90 tablet    Refill:  1    Order Specific Question:   Supervising Provider    Answer:   Lucille Passy [[id number]]    Follow-up: Return in about 1 month (around 06/19/2018) for HTN and neuropathy (need repeat BMP, TSH and T4).  Wilfred Lacy, NP

## 2018-06-18 ENCOUNTER — Telehealth: Payer: Self-pay | Admitting: Nurse Practitioner

## 2018-06-18 NOTE — Telephone Encounter (Signed)
Copied from [place] [phone]. Topic: Quick Communication - See Telephone Encounter >> Jun 18, 2018 10:45 AM Bea Graff, NT wrote: CRM for notification. See Telephone encounter for: 06/18/18. Pt calling and states she would not like to take the Eliquis. She states that she really doesn't want to change medications or add any new ones because she has just now gotten to the point where she can get out and do things and does not want to risk hindering that.

## 2018-06-18 NOTE — Telephone Encounter (Signed)
noted 

## 2018-06-18 NOTE — Telephone Encounter (Signed)
Tried to call the pt. Cant leave vm. Need more information because we never prescribe this med before.

## 2018-06-18 NOTE — Telephone Encounter (Signed)
Spoke with the pt. She stated she saw [facility] on 04/2018 for leg pain and [facility] offer Elavil for leg pain but pt denied.  Pt call back to report that she still doesn't want Elavil, she said her leg is getting better. Pt also wants to let [facility] know that she stop taking olmesartan for BP due to side effects " felt sick and weakness. Meds she is taking is : aspirin,gabapentin,diltiazem and synthroid.   FYI, pt has an appt 07/02/2018 with [facility] for a follow up.

## 2018-07-02 ENCOUNTER — Encounter: Payer: Self-pay | Admitting: Nurse Practitioner

## 2018-07-02 ENCOUNTER — Ambulatory Visit (INDEPENDENT_AMBULATORY_CARE_PROVIDER_SITE_OTHER): Payer: Medicare Other | Admitting: Nurse Practitioner

## 2018-07-02 VITALS — BP 148/86 | HR 88 | Temp 98.9°F | Ht 64.5 in | Wt 132.0 lb

## 2018-07-02 DIAGNOSIS — F418 Other specified anxiety disorders: Secondary | ICD-10-CM | POA: Diagnosis not present

## 2018-07-02 DIAGNOSIS — M792 Neuralgia and neuritis, unspecified: Secondary | ICD-10-CM | POA: Diagnosis not present

## 2018-07-02 DIAGNOSIS — I10 Essential (primary) hypertension: Secondary | ICD-10-CM | POA: Diagnosis not present

## 2018-07-02 DIAGNOSIS — R202 Paresthesia of skin: Secondary | ICD-10-CM | POA: Diagnosis not present

## 2018-07-02 DIAGNOSIS — E039 Hypothyroidism, unspecified: Secondary | ICD-10-CM

## 2018-07-02 LAB — BASIC METABOLIC PANEL
BUN: 24 mg/dL — ABNORMAL HIGH (ref 6–23)
CO2: 26 mEq/L (ref 19–32)
Calcium: 9.7 mg/dL (ref 8.4–10.5)
Chloride: 97 mEq/L (ref 96–112)
Creatinine, Ser: 1.21 mg/dL — ABNORMAL HIGH (ref 0.40–1.20)
GFR: 44.53 mL/min — ABNORMAL LOW (ref >60.00)
Glucose, Bld: 104 mg/dL — ABNORMAL HIGH (ref 70–99)
Potassium: 4.2 mEq/L (ref 3.5–5.1)
Sodium: 134 mEq/L — ABNORMAL LOW (ref 135–145)

## 2018-07-02 LAB — T4, FREE: Free T4: 0.88 ng/dL (ref 0.60–1.60)

## 2018-07-02 LAB — TSH: TSH: 1.36 u[IU]/mL (ref 0.35–4.50)

## 2018-07-02 MED ORDER — XANAX 0.25 MG PO TABS: 0.2500 mg | ORAL_TABLET | Freq: Every evening | ORAL | 1 refills | Status: DC | PRN

## 2018-07-02 NOTE — Progress Notes (Signed)
Subjective:  Patient ID: [name], female    DOB: 1927/04/22  Age: 84 y.o. MRN: [id number]  CC: Follow-up (HTN and neuropathy (need repeat BMP, TSH and T4). complaining of cant sleep since been off of xanax. )  HPI  accompanied by son.  Neuropathy: Pain is not controlled by gabapentin 100mg  at HS. Persistent dizziness, but improved per patient. Declined to use elavil or cymbalta. Unable to tolerate lyrica Ok with referral to neurology. She denies any fall. Reports fear of falling due to neuropathy. Will like Rx for walker.  Reports increased anxiety and lack of sleep due to pain. Will like to resume xanax (brand name). Xanax past filled 11/2017 per PMP database.  HTN: Not at goal. Current use of diltiazem only. BP Readings from Last 3 Encounters:  07/02/18 (!) 148/86  05/22/18 (!) 144/82  04/30/18 (!) 150/84  refuses to take metoprolol and benicar due to possible side effects.  Reviewed past Medical, Social and Family history today.  Outpatient Medications Prior to Visit  Medication Sig Dispense Refill  . acetaminophen (TYLENOL) 500 MG tablet Take 1,000 mg by mouth every 6 (six) hours as needed (joint pain).     Marland Kitchen dicyclomine (BENTYL) 20 MG tablet Take 0.5 tablets (10 mg total) by mouth daily as needed for spasms. 30 tablet 1  . aspirin 81 MG chewable tablet Chew 81 mg by mouth daily.    Marland Kitchen b complex vitamins tablet Take 1 tablet by mouth daily.    Marland Kitchen diltiazem (CARTIA XT) 120 MG 24 hr capsule Take 1 capsule (120 mg total) by mouth daily. 90 capsule 1  . gabapentin (NEURONTIN) 100 MG capsule Take 1 capsule (100 mg total) by mouth at bedtime. 30 capsule 5  . levothyroxine (SYNTHROID, LEVOTHROID) 75 MCG tablet Take 1 tablet (75 mcg total) by mouth daily before breakfast. 90 tablet 1  . olopatadine (PATANOL) 0.1 % ophthalmic solution Place 1 drop into both eyes 2 (two) times daily.    . calcium carbonate (OS-CAL) 600 MG TABS Take 1,800 mg by mouth daily.    .  cholecalciferol (VITAMIN D) 1000 units tablet Take 1 tablet (1,000 Units total) by mouth daily. (Patient not taking: Reported on 05/22/2018)    . capsicum oleoresin (TRIXAICIN) 0.025 % cream Apply 1 application topically 2 (two) times daily. (Patient not taking: Reported on 05/22/2018) 56.6 g 0  . ferrous sulfate 325 (65 FE) MG tablet Take 1 tablet (325 mg total) by mouth daily with breakfast. (Patient not taking: Reported on 07/02/2018)  3  . metoprolol tartrate (LOPRESSOR) 25 MG tablet Take 1 tablet (25 mg total) by mouth 2 (two) times daily. (Patient not taking: Reported on 07/02/2018) 60 tablet 11  . olmesartan (BENICAR) 20 MG tablet Take 1 tablet (20 mg total) by mouth daily. (Patient not taking: Reported on 07/02/2018) 90 tablet 1  . Omega-3 Fatty Acids (FISH OIL) 1000 MG CAPS Take 1 capsule by mouth daily.     Marland Kitchen OVER THE COUNTER MEDICATION Take 120 mg by mouth 2 (two) times daily. Areds 2    . RESTASIS 0.05 % ophthalmic emulsion Place 1 drop into both eyes 2 (two) times daily.    . vitamin E 400 UNIT capsule Take 400 Units by mouth daily.     No facility-administered medications prior to visit.     ROS See HPI  Objective:  BP (!) 148/86   Pulse 88   Temp 98.9 F (37.2 C) (Oral)   Ht 5' 4.5" (1.638  m)   Wt 132 lb (59.9 kg)   SpO2 99%   BMI 22.31 kg/m   BP Readings from Last 3 Encounters:  07/02/18 (!) 148/86  05/22/18 (!) 144/82  04/30/18 (!) 150/84    Wt Readings from Last 3 Encounters:  07/02/18 132 lb (59.9 kg)  05/22/18 132 lb (59.9 kg)  04/30/18 134 lb (60.8 kg)    Physical Exam  Constitutional: She is oriented to person, place, and time.  Musculoskeletal: She exhibits edema.  Neurological: She is alert and oriented to person, place, and time.  Skin: No erythema.  Psychiatric: Her speech is normal. Her mood appears anxious.  Vitals reviewed.   Lab Results  Component Value Date   WBC 9.5 01/07/2018   HGB 12.1 01/07/2018   HCT 36.6 01/07/2018   PLT 364.0  01/07/2018   GLUCOSE 104 (H) 07/02/2018   CHOL 240 (H) 07/29/2015   TRIG 220.0 (H) 07/29/2015   HDL 72.50 07/29/2015   LDLDIRECT 146.0 07/29/2015   LDLCALC 80 09/30/2012   ALT 5 01/07/2018   AST 12 01/07/2018   NA 134 (L) 07/02/2018   K 4.2 07/02/2018   CL 97 07/02/2018   CREATININE 1.21 (H) 07/02/2018   BUN 24 (H) 07/02/2018   CO2 26 07/02/2018   TSH 1.36 07/02/2018   INR 1.47 05/15/2014   HGBA1C 6.0 01/07/2018    Dg Ugi  W/kub  Result Date: 12/28/2014 CLINICAL DATA:  Increasing shortness of breath and pressure after starting arthritis medicine 2 months ago. History of chronic hiatal hernia. Initial encounter. EXAM: UPPER GI SERIES WITH KUB TECHNIQUE: After obtaining a scout radiograph a routine upper GI series was performed using thin and high density barium. FLUOROSCOPY TIME:  Radiation Exposure Index (as provided by the fluoroscopic device): If the device does not provide the exposure index: Fluoroscopy Time (in minutes and seconds): 1 minutes and 48 seconds. Number of Acquired Images: 1 scout image. Two non fluoro-store series (18 images total). COMPARISON:  Chest radiographs 11/25/2014 and 07/02/2014. FINDINGS: The scout abdominal radiograph demonstrates a nonobstructive bowel gas pattern. There is stool throughout the colon and mild lumbar spondylosis. The patient swallowed the contrast without difficulty. The esophageal motility is within normal limits for age. There is a large hiatal hernia with the majority of the stomach above the diaphragm. Contrast passed freely through the diaphragmatic hiatus into the distal stomach and small bowel. There is no evidence of gastric wall thickening or erosion. The proximal small bowel appears normal. Mild reflux was elicited with the water siphon test. A 13 mm barium tablet was administered and passed without delay into the stomach. IMPRESSION: 1. Large hiatal hernia without evidence of distal esophagitis or stricture. 2. Mild gastroesophageal  reflux. Electronically Signed   By: Richardean Sale M.D.   On: 12/28/2014 11:33    Assessment & Plan:   [name] was seen today for follow-up.  Diagnoses and all orders for this visit:  Paresthesia of both lower extremities -     Ambulatory referral to Neurology -     For home use only DME 4 wheeled rolling walker with seat -     gabapentin (NEURONTIN) 100 MG capsule; Take 1 capsule (100 mg total) by mouth at bedtime.  Anxiety about health -     XANAX 0.25 MG tablet; Take 1 tablet (0.25 mg total) by mouth at bedtime as needed for anxiety.  Neuropathic pain -     For home use only DME 4 wheeled rolling walker with seat -  gabapentin (NEURONTIN) 100 MG capsule; Take 1 capsule (100 mg total) by mouth at bedtime.  Hypothyroidism, unspecified type -     Basic Metabolic Panel (BMET) -     TSH -     T4, free -     levothyroxine (SYNTHROID, LEVOTHROID) 75 MCG tablet; Take 1 tablet (75 mcg total) by mouth daily before breakfast.  Essential hypertension -     diltiazem (CARDIZEM CD) 180 MG 24 hr capsule; Take 1 capsule (180 mg total) by mouth daily.   I have discontinued Deloria Lair. Makki's olopatadine, Fish Oil, aspirin, vitamin E, RESTASIS, metoprolol tartrate, capsicum oleoresin, b complex vitamins, OVER THE COUNTER MEDICATION, ferrous sulfate, and olmesartan. I have also changed her diltiazem. Additionally, I am having her start on XANAX. Lastly, I am having her maintain her calcium carbonate, acetaminophen, dicyclomine, cholecalciferol, levothyroxine, and gabapentin.  Meds ordered this encounter  Medications  . XANAX 0.25 MG tablet    Sig: Take 1 tablet (0.25 mg total) by mouth at bedtime as needed for anxiety.    Dispense:  30 tablet    Refill:  1    Order Specific Question:   Supervising Provider    Answer:   Lucille Passy [[id number]]  . diltiazem (CARDIZEM CD) 180 MG 24 hr capsule    Sig: Take 1 capsule (180 mg total) by mouth daily.    Dispense:  90 capsule    Refill:  3     Please consider 90 day supplies to promote better adherence    Order Specific Question:   Supervising Provider    Answer:   Lucille Passy [[id number]]  . levothyroxine (SYNTHROID, LEVOTHROID) 75 MCG tablet    Sig: Take 1 tablet (75 mcg total) by mouth daily before breakfast.    Dispense:  90 tablet    Refill:  3    Order Specific Question:   Supervising Provider    Answer:   Lucille Passy [[id number]]  . gabapentin (NEURONTIN) 100 MG capsule    Sig: Take 1 capsule (100 mg total) by mouth at bedtime.    Dispense:  90 capsule    Refill:  3    Order Specific Question:   Supervising Provider    Answer:   Lucille Passy [[id number]]    Follow-up: Return in about 3 months (around 10/01/2018) for anxiety and HTN.  Wilfred Lacy, NP

## 2018-07-02 NOTE — Patient Instructions (Addendum)
You will be contacted to schedule appt with neurology.  I explain to patient that xanax is not for pain management, nor insomnia. It is used as needed for anxiety. I explained that insomnia and anxiety and pain is due to uncontrolled leg pain. The medications recommended: elavil or cymbalta, are meant for that. Since she is not comfortable taking any additional medication, nor unable to tolerate or increased dose of gabapentin  Stable lab reults. Medication refill sent.  F/p in 82months.

## 2018-07-03 ENCOUNTER — Encounter: Payer: Self-pay | Admitting: Nurse Practitioner

## 2018-07-03 DIAGNOSIS — F418 Other specified anxiety disorders: Secondary | ICD-10-CM

## 2018-07-03 MED ORDER — LEVOTHYROXINE SODIUM 75 MCG PO TABS: 75.0000 ug | ORAL_TABLET | Freq: Every day | ORAL | 3 refills | Status: DC

## 2018-07-03 MED ORDER — DILTIAZEM HCL ER COATED BEADS 180 MG PO CP24: 180.0000 mg | ORAL_CAPSULE | Freq: Every day | ORAL | 3 refills | Status: DC

## 2018-07-03 MED ORDER — GABAPENTIN 100 MG PO CAPS: 100.0000 mg | ORAL_CAPSULE | Freq: Every day | ORAL | 3 refills | Status: DC

## 2018-07-03 NOTE — Assessment & Plan Note (Signed)
She was informed that xanax dose will not be increased. If anxiety is not controlled, we will need to discuss other medications.  I explained to patient that xanax is not for pain nor pain management. It is used as needed for anxiety. I explained that insomnia and anxiety is due to uncontrolled leg pain.  The medications recommended: elavil or cymbalta, are meant for that. Since she is not comfortable taking any additional medication, and unable to tolerate ;lyrica or an increased dose of gabapentin, we deferred to neurology to see if there is an additional suggestion.

## 2018-07-03 NOTE — Assessment & Plan Note (Signed)
She agreed to referral to neurology. Pain is not controlled by gabapentin 100mg  at HS. Persistent dizziness. Declined to use elavil or cymbalta.

## 2018-07-03 NOTE — Assessment & Plan Note (Signed)
HTN: Not at goal. Current use of diltiazem only. BP Readings from Last 3 Encounters:  07/02/18 (!) 148/86  05/22/18 (!) 144/82  04/30/18 (!) 150/84  refusses to take metoprolol and benicar due to possible side effects.  Increased diltiazem to 180mg . F/up in 68months

## 2018-07-03 NOTE — Assessment & Plan Note (Signed)
Declined referral to GI. Refused to take ferrous sulfate

## 2018-07-10 ENCOUNTER — Telehealth: Payer: Self-pay | Admitting: Nurse Practitioner

## 2018-07-10 DIAGNOSIS — I10 Essential (primary) hypertension: Secondary | ICD-10-CM

## 2018-07-10 MED ORDER — OLMESARTAN MEDOXOMIL 20 MG PO TABS: 20.0000 mg | ORAL_TABLET | Freq: Every day | ORAL | 1 refills | Status: DC

## 2018-07-10 NOTE — Telephone Encounter (Signed)
Copied from [place] [id number]. Topic: Quick Communication - Rx Refill/Question >> Jul 10, 2018  8:05 AM Scherrie Gerlach wrote: Medication: diltiazem (CARDIZEM CD) 180 MG 24 hr capsule Pt states this med is giving her vertigo.  She used to be on Benicar 40 mg and that was working.  Her previous dr in [place] prescribed this.  She is just now realizing this is causing her vertigo and not agreeing with her. Pt would like to go back on the Benicar 40 mg. [place], [place]. [phone] (Phone) [phone] (Fax)

## 2018-07-10 NOTE — Telephone Encounter (Signed)
Please advise 

## 2018-07-10 NOTE — Telephone Encounter (Signed)
Cancel this request per pt.

## 2018-07-10 NOTE — Telephone Encounter (Signed)
Both provider please advise.     Copied from [place] [id number]. Topic: Appointment Scheduling - Scheduling Inquiry for Clinic >> Jul 10, 2018  8:16 AM Scherrie Gerlach wrote: Reason for CRM: pt would like to switch to Dr Zigmund Daniel from [place].  She says she likes [facility] very [facility], but with all her health concerns, she would prefer to see a dr. Please advise if ok to transfer.

## 2018-07-10 NOTE — Telephone Encounter (Signed)
Ms. Altidor stated she did not start new prescription of diltiazem 180mg . She has been taking diltiazem 120mg  since last office visit with me. She reports vertigo after each dose of diltiazem 120mg  taken at noon. She will like to resume benicar and discontinue diltiazem. She does not check BP at home. She is not interested in lower dose of diltiazem. I advised her to hold diltiazem, start benicar for hypertension. She is to make follow up appt 49month form today for re evaluation of BP. She verbalized understanding and agreed to above plan.

## 2018-07-12 ENCOUNTER — Ambulatory Visit: Payer: Self-pay

## 2018-07-12 NOTE — Telephone Encounter (Signed)
Pt. Asking if she can take Benicar with Neurontin. No contraindications noted. Told pt. She could talk to her pharmacist.

## 2018-07-16 DIAGNOSIS — H35439 Paving stone degeneration of retina, unspecified eye: Secondary | ICD-10-CM | POA: Diagnosis not present

## 2018-07-16 DIAGNOSIS — H35033 Hypertensive retinopathy, bilateral: Secondary | ICD-10-CM | POA: Diagnosis not present

## 2018-07-16 DIAGNOSIS — Z961 Presence of intraocular lens: Secondary | ICD-10-CM | POA: Diagnosis not present

## 2018-07-16 DIAGNOSIS — H353132 Nonexudative age-related macular degeneration, bilateral, intermediate dry stage: Secondary | ICD-10-CM | POA: Diagnosis not present

## 2018-07-24 ENCOUNTER — Other Ambulatory Visit: Payer: Self-pay

## 2018-07-24 ENCOUNTER — Emergency Department (HOSPITAL_COMMUNITY): Payer: Medicare Other

## 2018-07-24 ENCOUNTER — Inpatient Hospital Stay (HOSPITAL_COMMUNITY)
Admission: EM | Admit: 2018-07-24 | Discharge: 2018-08-02 | DRG: 391 | Disposition: A | Discharge status: Home / Self Care | Payer: Medicare Other | Attending: Family Medicine | Admitting: Family Medicine

## 2018-07-24 ENCOUNTER — Encounter (HOSPITAL_COMMUNITY): Payer: Self-pay

## 2018-07-24 DIAGNOSIS — E860 Dehydration: Secondary | ICD-10-CM | POA: Diagnosis present

## 2018-07-24 DIAGNOSIS — I480 Paroxysmal atrial fibrillation: Secondary | ICD-10-CM

## 2018-07-24 DIAGNOSIS — N183 Chronic kidney disease, stage 3 unspecified: Secondary | ICD-10-CM | POA: Diagnosis present

## 2018-07-24 DIAGNOSIS — D631 Anemia in chronic kidney disease: Secondary | ICD-10-CM | POA: Diagnosis present

## 2018-07-24 DIAGNOSIS — Z5320 Procedure and treatment not carried out because of patient's decision for unspecified reasons: Secondary | ICD-10-CM | POA: Diagnosis not present

## 2018-07-24 DIAGNOSIS — D649 Anemia, unspecified: Secondary | ICD-10-CM

## 2018-07-24 DIAGNOSIS — N182 Chronic kidney disease, stage 2 (mild): Secondary | ICD-10-CM | POA: Diagnosis present

## 2018-07-24 DIAGNOSIS — E43 Unspecified severe protein-calorie malnutrition: Secondary | ICD-10-CM | POA: Diagnosis present

## 2018-07-24 DIAGNOSIS — D473 Essential (hemorrhagic) thrombocythemia: Secondary | ICD-10-CM | POA: Diagnosis not present

## 2018-07-24 DIAGNOSIS — Z8719 Personal history of other diseases of the digestive system: Secondary | ICD-10-CM

## 2018-07-24 DIAGNOSIS — K4090 Unilateral inguinal hernia, without obstruction or gangrene, not specified as recurrent: Secondary | ICD-10-CM | POA: Diagnosis not present

## 2018-07-24 DIAGNOSIS — Z888 Allergy status to other drugs, medicaments and biological substances status: Secondary | ICD-10-CM

## 2018-07-24 DIAGNOSIS — I482 Chronic atrial fibrillation, unspecified: Secondary | ICD-10-CM | POA: Diagnosis present

## 2018-07-24 DIAGNOSIS — I350 Nonrheumatic aortic (valve) stenosis: Secondary | ICD-10-CM | POA: Diagnosis present

## 2018-07-24 DIAGNOSIS — K5720 Diverticulitis of large intestine with perforation and abscess without bleeding: Principal | ICD-10-CM

## 2018-07-24 DIAGNOSIS — A419 Sepsis, unspecified organism: Secondary | ICD-10-CM | POA: Diagnosis not present

## 2018-07-24 DIAGNOSIS — E876 Hypokalemia: Secondary | ICD-10-CM | POA: Diagnosis not present

## 2018-07-24 DIAGNOSIS — I129 Hypertensive chronic kidney disease with stage 1 through stage 4 chronic kidney disease, or unspecified chronic kidney disease: Secondary | ICD-10-CM | POA: Diagnosis present

## 2018-07-24 DIAGNOSIS — E039 Hypothyroidism, unspecified: Secondary | ICD-10-CM

## 2018-07-24 DIAGNOSIS — N83202 Unspecified ovarian cyst, left side: Secondary | ICD-10-CM | POA: Diagnosis present

## 2018-07-24 DIAGNOSIS — K5732 Diverticulitis of large intestine without perforation or abscess without bleeding: Secondary | ICD-10-CM

## 2018-07-24 DIAGNOSIS — D638 Anemia in other chronic diseases classified elsewhere: Secondary | ICD-10-CM | POA: Diagnosis present

## 2018-07-24 DIAGNOSIS — I4819 Other persistent atrial fibrillation: Secondary | ICD-10-CM | POA: Diagnosis present

## 2018-07-24 DIAGNOSIS — K449 Diaphragmatic hernia without obstruction or gangrene: Secondary | ICD-10-CM | POA: Diagnosis not present

## 2018-07-24 DIAGNOSIS — M792 Neuralgia and neuritis, unspecified: Secondary | ICD-10-CM | POA: Diagnosis present

## 2018-07-24 DIAGNOSIS — N390 Urinary tract infection, site not specified: Secondary | ICD-10-CM | POA: Diagnosis present

## 2018-07-24 DIAGNOSIS — M069 Rheumatoid arthritis, unspecified: Secondary | ICD-10-CM | POA: Diagnosis present

## 2018-07-24 DIAGNOSIS — R7989 Other specified abnormal findings of blood chemistry: Secondary | ICD-10-CM | POA: Diagnosis present

## 2018-07-24 DIAGNOSIS — Z886 Allergy status to analgesic agent status: Secondary | ICD-10-CM | POA: Diagnosis not present

## 2018-07-24 DIAGNOSIS — Z880 Allergy status to penicillin: Secondary | ICD-10-CM

## 2018-07-24 DIAGNOSIS — K5792 Diverticulitis of intestine, part unspecified, without perforation or abscess without bleeding: Secondary | ICD-10-CM | POA: Diagnosis not present

## 2018-07-24 DIAGNOSIS — Z79899 Other long term (current) drug therapy: Secondary | ICD-10-CM

## 2018-07-24 DIAGNOSIS — K529 Noninfective gastroenteritis and colitis, unspecified: Secondary | ICD-10-CM | POA: Diagnosis present

## 2018-07-24 DIAGNOSIS — Z885 Allergy status to narcotic agent status: Secondary | ICD-10-CM

## 2018-07-24 DIAGNOSIS — N179 Acute kidney failure, unspecified: Secondary | ICD-10-CM | POA: Diagnosis not present

## 2018-07-24 DIAGNOSIS — R110 Nausea: Secondary | ICD-10-CM | POA: Diagnosis not present

## 2018-07-24 DIAGNOSIS — E440 Moderate protein-calorie malnutrition: Secondary | ICD-10-CM | POA: Diagnosis not present

## 2018-07-24 DIAGNOSIS — R1084 Generalized abdominal pain: Secondary | ICD-10-CM | POA: Diagnosis not present

## 2018-07-24 DIAGNOSIS — G8929 Other chronic pain: Secondary | ICD-10-CM | POA: Diagnosis present

## 2018-07-24 DIAGNOSIS — Z9049 Acquired absence of other specified parts of digestive tract: Secondary | ICD-10-CM

## 2018-07-24 DIAGNOSIS — Z87891 Personal history of nicotine dependence: Secondary | ICD-10-CM

## 2018-07-24 DIAGNOSIS — I10 Essential (primary) hypertension: Secondary | ICD-10-CM | POA: Diagnosis not present

## 2018-07-24 LAB — COMPREHENSIVE METABOLIC PANEL
ALT: 15 U/L (ref 0–44)
AST: 27 U/L (ref 15–41)
Albumin: 3.4 g/dL — ABNORMAL LOW (ref 3.5–5.0)
Alkaline Phosphatase: 88 U/L (ref 38–126)
Anion gap: 11 (ref 5–15)
BUN: 25 mg/dL — ABNORMAL HIGH (ref 8–23)
CO2: 27 mmol/L (ref 22–32)
Calcium: 9.1 mg/dL (ref 8.9–10.3)
Chloride: 99 mmol/L (ref 98–111)
Creatinine, Ser: 1.18 mg/dL — ABNORMAL HIGH (ref 0.44–1.00)
GFR calc Af Amer: 46 mL/min — ABNORMAL LOW (ref >60)
GFR calc non Af Amer: 40 mL/min — ABNORMAL LOW (ref >60)
Glucose, Bld: 121 mg/dL — ABNORMAL HIGH (ref 70–99)
Potassium: 4.6 mmol/L (ref 3.5–5.1)
Sodium: 137 mmol/L (ref 135–145)
Total Bilirubin: 0.4 mg/dL (ref 0.3–1.2)
Total Protein: 8 g/dL (ref 6.5–8.1)

## 2018-07-24 LAB — CBC WITH DIFFERENTIAL/PLATELET
Basophils Absolute: 0 10*3/uL (ref 0.0–0.1)
Basophils Relative: 0 %
Eosinophils Absolute: 0.2 10*3/uL (ref 0.0–0.7)
Eosinophils Relative: 1 %
HCT: 36.1 % (ref 36.0–46.0)
Hemoglobin: 11.5 g/dL — ABNORMAL LOW (ref 12.0–15.0)
Lymphocytes Relative: 10 %
Lymphs Abs: 1.8 10*3/uL (ref 0.7–4.0)
MCH: 27 pg (ref 26.0–34.0)
MCHC: 31.9 g/dL (ref 30.0–36.0)
MCV: 84.7 fL (ref 78.0–100.0)
Monocytes Absolute: 0.9 10*3/uL (ref 0.1–1.0)
Monocytes Relative: 5 %
Neutro Abs: 14.5 10*3/uL — ABNORMAL HIGH (ref 1.7–7.7)
Neutrophils Relative %: 84 %
Platelets: 452 10*3/uL — ABNORMAL HIGH (ref 150–400)
RBC: 4.26 MIL/uL (ref 3.87–5.11)
RDW: 15.2 % (ref 11.5–15.5)
WBC: 17.4 10*3/uL — ABNORMAL HIGH (ref 4.0–10.5)

## 2018-07-24 LAB — PROTIME-INR
INR: 1.08
Prothrombin Time: 13.9 seconds (ref 11.4–15.2)

## 2018-07-24 LAB — APTT: aPTT: 39 seconds — ABNORMAL HIGH (ref 24–36)

## 2018-07-24 LAB — URINALYSIS, ROUTINE W REFLEX MICROSCOPIC
Bacteria, UA: NONE SEEN
Bilirubin Urine: NEGATIVE
Glucose, UA: NEGATIVE mg/dL
Ketones, ur: NEGATIVE mg/dL
Nitrite: NEGATIVE
Protein, ur: NEGATIVE mg/dL
Specific Gravity, Urine: 1.016 (ref 1.005–1.030)
pH: 5 (ref 5.0–8.0)

## 2018-07-24 LAB — LIPASE, BLOOD: Lipase: 45 U/L (ref 11–51)

## 2018-07-24 LAB — I-STAT CG4 LACTIC ACID, ED: Lactic Acid, Venous: 1.22 mmol/L (ref 0.5–1.9)

## 2018-07-24 MED ORDER — IRBESARTAN 150 MG PO TABS
150.0000 mg | ORAL_TABLET | Freq: Every day | ORAL | Status: DC
  Administered 2018-07-25 – 2018-08-02 (×9): 150 mg via ORAL
  Filled 2018-07-24 (×9): qty 1

## 2018-07-24 MED ORDER — PANTOPRAZOLE SODIUM 40 MG PO TBEC
40.0000 mg | DELAYED_RELEASE_TABLET | Freq: Every day | ORAL | Status: DC
  Administered 2018-07-24 – 2018-08-02 (×10): 40 mg via ORAL
  Filled 2018-07-24 (×11): qty 1

## 2018-07-24 MED ORDER — ONDANSETRON HCL 4 MG/2ML IJ SOLN: 4.0000 mg | Freq: Four times a day (QID) | INTRAMUSCULAR | Status: DC | PRN

## 2018-07-24 MED ORDER — SODIUM CHLORIDE 0.9% FLUSH
3.0000 mL | Freq: Two times a day (BID) | INTRAVENOUS | Status: DC
  Administered 2018-07-24 – 2018-08-02 (×11): 3 mL via INTRAVENOUS

## 2018-07-24 MED ORDER — OLOPATADINE HCL 0.1 % OP SOLN
1.0000 [drp] | Freq: Two times a day (BID) | OPHTHALMIC | Status: DC
  Administered 2018-07-25 – 2018-08-02 (×14): 1 [drp] via OPHTHALMIC
  Filled 2018-07-24: qty 5

## 2018-07-24 MED ORDER — ACETAMINOPHEN 325 MG PO TABS
650.0000 mg | ORAL_TABLET | Freq: Four times a day (QID) | ORAL | Status: DC | PRN
  Administered 2018-07-27 – 2018-07-29 (×3): 650 mg via ORAL
  Filled 2018-07-24 (×3): qty 2

## 2018-07-24 MED ORDER — SODIUM CHLORIDE 0.9 % IV BOLUS
500.0000 mL | Freq: Once | INTRAVENOUS | Status: AC
  Administered 2018-07-24: 500 mL via INTRAVENOUS

## 2018-07-24 MED ORDER — SODIUM CHLORIDE 0.9 % IV SOLN
1.0000 g | Freq: Two times a day (BID) | INTRAVENOUS | Status: DC
  Administered 2018-07-25 – 2018-07-26 (×2): 1 g via INTRAVENOUS
  Filled 2018-07-24 (×4): qty 1

## 2018-07-24 MED ORDER — ONDANSETRON HCL 4 MG PO TABS: 4.0000 mg | ORAL_TABLET | Freq: Four times a day (QID) | ORAL | Status: DC | PRN

## 2018-07-24 MED ORDER — FENTANYL CITRATE (PF) 100 MCG/2ML IJ SOLN
50.0000 ug | Freq: Once | INTRAMUSCULAR | Status: AC
  Administered 2018-07-24: 50 ug via INTRAVENOUS
  Filled 2018-07-24: qty 2

## 2018-07-24 MED ORDER — GABAPENTIN 100 MG PO CAPS
100.0000 mg | ORAL_CAPSULE | Freq: Every day | ORAL | Status: DC
  Administered 2018-07-24 – 2018-08-01 (×9): 100 mg via ORAL
  Filled 2018-07-24 (×9): qty 1

## 2018-07-24 MED ORDER — FENTANYL CITRATE (PF) 100 MCG/2ML IJ SOLN
25.0000 ug | INTRAMUSCULAR | Status: DC | PRN
  Administered 2018-07-27 – 2018-07-28 (×4): 25 ug via INTRAVENOUS
  Filled 2018-07-24 (×4): qty 2

## 2018-07-24 MED ORDER — ALPRAZOLAM 0.25 MG PO TABS
0.2500 mg | ORAL_TABLET | Freq: Every day | ORAL | Status: DC
  Administered 2018-07-24 – 2018-08-01 (×9): 0.25 mg via ORAL
  Filled 2018-07-24 (×10): qty 1

## 2018-07-24 MED ORDER — ASPIRIN 81 MG PO CHEW
81.0000 mg | CHEWABLE_TABLET | Freq: Every day | ORAL | Status: DC
  Administered 2018-07-24 – 2018-08-02 (×10): 81 mg via ORAL
  Filled 2018-07-24 (×10): qty 1

## 2018-07-24 MED ORDER — ALBUTEROL SULFATE (2.5 MG/3ML) 0.083% IN NEBU: 2.5000 mg | INHALATION_SOLUTION | Freq: Four times a day (QID) | RESPIRATORY_TRACT | Status: DC | PRN

## 2018-07-24 MED ORDER — ONDANSETRON HCL 4 MG/2ML IJ SOLN
4.0000 mg | Freq: Once | INTRAMUSCULAR | Status: AC
  Administered 2018-07-24: 4 mg via INTRAVENOUS
  Filled 2018-07-24: qty 2

## 2018-07-24 MED ORDER — ENOXAPARIN SODIUM 30 MG/0.3ML ~~LOC~~ SOLN
30.0000 mg | Freq: Every day | SUBCUTANEOUS | Status: DC
  Administered 2018-07-24 – 2018-08-01 (×9): 30 mg via SUBCUTANEOUS
  Filled 2018-07-24 (×9): qty 0.3

## 2018-07-24 MED ORDER — IOHEXOL 300 MG/ML  SOLN
75.0000 mL | Freq: Once | INTRAMUSCULAR | Status: AC | PRN
  Administered 2018-07-24: 75 mL via INTRAVENOUS

## 2018-07-24 MED ORDER — METOPROLOL TARTRATE 5 MG/5ML IV SOLN
5.0000 mg | Freq: Once | INTRAVENOUS | Status: AC
  Administered 2018-07-24: 5 mg via INTRAVENOUS
  Filled 2018-07-24: qty 5

## 2018-07-24 MED ORDER — ALBUTEROL SULFATE (2.5 MG/3ML) 0.083% IN NEBU: 2.5000 mg | INHALATION_SOLUTION | Freq: Four times a day (QID) | RESPIRATORY_TRACT | Status: DC

## 2018-07-24 MED ORDER — LEVOTHYROXINE SODIUM 75 MCG PO TABS
75.0000 ug | ORAL_TABLET | Freq: Every day | ORAL | Status: DC
  Administered 2018-07-25 – 2018-08-02 (×9): 75 ug via ORAL
  Filled 2018-07-24 (×2): qty 3
  Filled 2018-07-24: qty 1
  Filled 2018-07-24: qty 3
  Filled 2018-07-24 (×2): qty 1
  Filled 2018-07-24 (×3): qty 3
  Filled 2018-07-24 (×2): qty 1
  Filled 2018-07-24: qty 3
  Filled 2018-07-24: qty 1
  Filled 2018-07-24 (×2): qty 3
  Filled 2018-07-24 (×3): qty 1

## 2018-07-24 MED ORDER — ACETAMINOPHEN 650 MG RE SUPP: 650.0000 mg | Freq: Four times a day (QID) | RECTAL | Status: DC | PRN

## 2018-07-24 MED ORDER — SODIUM CHLORIDE 0.9 % IV SOLN
1.0000 g | Freq: Once | INTRAVENOUS | Status: AC
  Administered 2018-07-24: 1 g via INTRAVENOUS
  Filled 2018-07-24: qty 1

## 2018-07-24 MED ORDER — SODIUM CHLORIDE 0.9 % IV SOLN
INTRAVENOUS | Status: AC
  Administered 2018-07-24 – 2018-07-30 (×9): via INTRAVENOUS

## 2018-07-24 NOTE — ED Triage Notes (Signed)
Pt arrived via EMS from home. Pt has been c/o mid abdominal pain x 1 week, area is tender to touch. Pt has hx of hernia x 2 years. Pt is alert and oriented x 4 .   BP 142/88, HR 86, RR 16, O2 sat 99%

## 2018-07-24 NOTE — ED Provider Notes (Signed)
[facility] DEPT Provider Note   CSN: [id number] Arrival date & time: 07/24/18  [place]     History   Chief Complaint Chief Complaint  Patient presents with  . Abdominal Pain    HPI [name] is a 84 y.o. female with a history of rheumatoid arthritis previously on Remicade and chronic steroids, A. fib, hypothyroidism, hypertension, hyperlipidemia who presents emergency department today for abdominal pain.  Patient reports she was diagnosed with rheumatoid arthritis last year.  She was placed on chronic steroids for this for several months.  She reports she was also treated with Remicade. She stopped taking Remicade infusions as well as her chronic steroids at the beginning of 2019. She reported diffuse abdominal pain after this and was seen by her primary care provider and had abdominal x-rays that showed a hiatal hernia.  She reports continued pain over the last several months that has worsened over the last 1-2 weeks.  She notes that her pain is mainly in her left lower quadrant but at times it becomes generalized.  She reports she has been taking Tylenol for symptoms without any relief.  She denies any associated fever, chills, chest pain, shortness of breath, cough, flank pain, urinary symptoms, nausea/vomiting/diarrhea.  She reports a normal bowel movement 3 hours ago.  No melena or hematochezia.  She notes previous appendectomy.  She still passing gas. No prior colonoscopy. She is not on any blood thinners.   HPI  Past Medical History:  Diagnosis Date  . A-fib ([place])   . Aortic stenosis, moderate    last echo in 2010; AVA 1.1; mild AS per echo in June 2013  . Edema of both legs    s/p laser treatment per Dr. Donnetta Hutching  . Hiatal hernia   . History of chicken pox   . HLD (hyperlipidemia)   . HTN (hypertension)   . Hypothyroidism   . IBS (irritable bowel syndrome)   . Iron deficiency anemia   . Neuropathic pain 09/06/2015  . Rheumatoid arthritis  [facility])     Patient Active Problem List   Diagnosis Date Noted  . Anxiety about health 07/03/2018  . Generalized abdominal pain 02/06/2018  . Paresthesia of both lower extremities 02/06/2018  . CKD (chronic kidney disease) stage 2, GFR 60-89 ml/min 01/07/2018  . Anemia 01/07/2018  . Hyperglycemia 01/07/2018  . Pyoderma gangrenosum 01/04/2017  . Primary osteoarthritis of both hands 01/04/2017  . Primary osteoarthritis of both feet 01/04/2017  . Primary osteoarthritis of both knees 01/04/2017  . Degenerative joint disease involving multiple joints 01/04/2017  . Neuropathic pain 09/06/2015  . Erythema nodosum 09/21/2014  . Rheumatoid arthritis ([place]) 09/21/2014  . Essential hypertension 09/02/2014  . Rectal bleeding 05/15/2014  . Atrial fibrillation ([place]) 05/11/2014  . HLD (hyperlipidemia) 04/02/2014  . Hypothyroidism 04/02/2014  . PAC (premature atrial contraction) 01/23/2014  . Atrial tachycardia, paroxysmal ([place]) 01/23/2014  . Near syncope 01/22/2014  . Varicose veins of lower extremities with other complications 20/35/5974  . Venous insufficiency of both lower extremities 04/19/2012  . Leg edema 01/17/2012  . Aortic stenosis 03/22/2011    Past Surgical History:  Procedure Laterality Date  . APPENDECTOMY  1955  . CARPAL TUNNEL RELEASE Right   . CATARACT EXTRACTION    . ENDOSCOPIC VEIN LASER TREATMENT    . ENDOVENOUS ABLATION SAPHENOUS VEIN W/ LASER  08-29-2012   left greater saphenous vein   Curt Jews MD  . ENDOVENOUS ABLATION SAPHENOUS VEIN W/ LASER  09-19-2012  right greater saphenous vein by Curt Jews MD  . EYE SURGERY  2005   Bilateral cataract  . FOOT SURGERY  2003   bilateral Hammer Toe  . stab phlebectomy Right 01-02-2013   10-15 incisions right thigh and calf by Curt Jews MD     OB History   None      Home Medications    Prior to Admission medications   Medication Sig Start Date End Date Taking? Authorizing Provider  acetaminophen (TYLENOL) 500 MG  tablet Take 1,000 mg by mouth every 6 (six) hours as needed (joint pain).     [provider]  calcium carbonate (OS-CAL) 600 MG TABS Take 1,800 mg by mouth daily.    [provider]  cholecalciferol (VITAMIN D) 1000 units tablet Take 1 tablet (1,000 Units total) by mouth daily. Patient not taking: Reported on 05/22/2018 04/30/18   Nche, Charlene Brooke, NP  dicyclomine (BENTYL) 20 MG tablet Take 0.5 tablets (10 mg total) by mouth daily as needed for spasms. 02/06/18   Nche, Charlene Brooke, NP  gabapentin (NEURONTIN) 100 MG capsule Take 1 capsule (100 mg total) by mouth at bedtime. 07/03/18   Nche, Charlene Brooke, NP  levothyroxine (SYNTHROID, LEVOTHROID) 75 MCG tablet Take 1 tablet (75 mcg total) by mouth daily before breakfast. 07/03/18   Nche, Charlene Brooke, NP  olmesartan (BENICAR) 20 MG tablet Take 1 tablet (20 mg total) by mouth daily. 07/10/18   Nche, Charlene Brooke, NP  XANAX 0.25 MG tablet Take 1 tablet (0.25 mg total) by mouth at bedtime as needed for anxiety. 07/02/18   Nche, Charlene Brooke, NP    Family History Family History  Problem Relation Age of Onset  . Heart disease Mother   . Peripheral vascular disease Mother   . Heart disease Father   . Peripheral vascular disease Father        Right leg amputation  . COPD Father   . Heart disease Brother 70       Heart Disease before age 24  . Heart attack Brother   . Peripheral vascular disease Unknown   . Hypertension Sister     Social History Social History   Tobacco Use  . Smoking status: Former Smoker    Types: Cigarettes    Last attempt to quit: 10/31/1983    Years since quitting: 34.7  . Smokeless tobacco: Never Used  Substance Use Topics  . Alcohol use: No  . Drug use: No     Allergies   Infliximab; Aspirin; Carvedilol; Codeine; and Penicillins   Review of Systems Review of Systems  All other systems reviewed and are negative.    Physical Exam Updated Vital Signs BP (!) 178/97   Pulse 82   Temp  98.6 F (37 C) (Oral)   Resp 15   SpO2 100%   Physical Exam  Constitutional: She appears well-developed and well-nourished.  HENT:  Head: Normocephalic and atraumatic.  Right Ear: External ear normal.  Left Ear: External ear normal.  Nose: Nose normal.  Mouth/Throat: Uvula is midline, oropharynx is clear and moist and mucous membranes are normal. No tonsillar exudate.  Eyes: Pupils are equal, round, and reactive to light. Right eye exhibits no discharge. Left eye exhibits no discharge. No scleral icterus.  Neck: Trachea normal. Neck supple. No spinous process tenderness present. No neck rigidity. Normal range of motion present.  Cardiovascular: Normal rate, regular rhythm and intact distal pulses.  No murmur heard. Pulses:      Radial pulses  are 2+ on the right side, and 2+ on the left side.       Dorsalis pedis pulses are 2+ on the right side, and 2+ on the left side.       Posterior tibial pulses are 2+ on the right side, and 2+ on the left side.  Pulmonary/Chest: Effort normal and breath sounds normal. She exhibits no tenderness.  Abdominal: Soft. Bowel sounds are normal. She exhibits no distension. There is generalized tenderness and tenderness in the left lower quadrant. There is no rigidity, no rebound, no guarding, no CVA tenderness, no tenderness at McBurney's point and negative Murphy's sign.  Musculoskeletal: She exhibits no edema.  Lymphadenopathy:    She has no cervical adenopathy.  Neurological: She is alert.  Skin: Skin is warm and dry. No rash noted. She is not diaphoretic.  Psychiatric: She has a normal mood and affect.  Nursing note and vitals reviewed.    ED Treatments / Results  Labs (all labs ordered are listed, but only abnormal results are displayed) Labs Reviewed  COMPREHENSIVE METABOLIC PANEL - Abnormal; Notable for the following components:      Result Value   Glucose, Bld 121 (*)    BUN 25 (*)    Creatinine, Ser 1.18 (*)    Albumin 3.4 (*)    GFR  calc non Af Amer 40 (*)    GFR calc Af Amer 46 (*)    All other components within normal limits  CBC WITH DIFFERENTIAL/PLATELET - Abnormal; Notable for the following components:   WBC 17.4 (*)    Hemoglobin 11.5 (*)    Platelets 452 (*)    Neutro Abs 14.5 (*)    All other components within normal limits  URINALYSIS, ROUTINE W REFLEX MICROSCOPIC - Abnormal; Notable for the following components:   Hgb urine dipstick SMALL (*)    Leukocytes, UA MODERATE (*)    All other components within normal limits  APTT - Abnormal; Notable for the following components:   aPTT 39 (*)    All other components within normal limits  CULTURE, BLOOD (ROUTINE X 2)  CULTURE, BLOOD (ROUTINE X 2)  LIPASE, BLOOD  PROTIME-INR  CBC  BASIC METABOLIC PANEL  MAGNESIUM  PROCALCITONIN  I-STAT CG4 LACTIC ACID, ED    EKG None  Radiology Ct Abdomen Pelvis W Contrast  Result Date: 07/24/2018 CLINICAL DATA:  Acute mid abdominal pain and tenderness for 1 week with nausea and vomiting EXAM: CT ABDOMEN AND PELVIS WITH CONTRAST TECHNIQUE: Multidetector CT imaging of the abdomen and pelvis was performed using the standard protocol following bolus administration of intravenous contrast. CONTRAST:  67mL OMNIPAQUE IOHEXOL 300 MG/ML  SOLN COMPARISON:  12/28/2014 upper GI series FINDINGS: Lower chest: Minor basilar scarring. Normal heart size. No pericardial pleural effusion. Large hiatal hernia with the majority of the stomach in the chest. Degenerative changes of the spine. Hepatobiliary: 10 mm left hepatic hypodensity, suspect small cysts. No significant hepatic abnormality. Gallbladder and biliary system unremarkable. Common bile duct nondilated. Pancreas: Unremarkable. No pancreatic ductal dilatation or surrounding inflammatory changes. Spleen: Inferior splenic subcapsular hypodensity, suspect splenic cyst. No splenic abnormality. Adrenals/Urinary Tract: Normal adrenal glands for age. Kidneys demonstrate no acute perinephric  edema, obstruction pattern, hydronephrosis, or obstructive uropathy. Ureters are nondilated. Urinary bladder unremarkable. Stomach/Bowel: Negative for bowel obstruction, significant dilatation, ileus, free air. Extensive colonic diverticulosis. In the pelvis , there is extensive diverticular disease, wall thickening, surrounding edema and pericolonic fluid about the sigmoid colon compatible with sigmoid colitis/diverticulitis. Difficult  to exclude underlying mural lesion. Small scattered extraluminal entrapped air about the sigmoid diverticulitis compatible with areas of micro perforation. No drainable fluid collection or abscess. Vascular/Lymphatic: Aortoiliac atherosclerosis noted. No aneurysm or occlusive process. No acute vascular finding. Mesenteric and renal vasculature remain patent. No adenopathy. Reproductive: Uterus is midline mildly prominent size. Right ovary is atrophic. Multi loculated left adnexal ovarian cystic mass measures 4.9 x 3.4 cm, images 51-52. Other: No ascites.  Intact abdominal wall.  No hernia. Musculoskeletal: Degenerative changes of the spine. Bones are osteopenic. Facet arthropathy noted. No acute osseous finding. IMPRESSION: Acute sigmoid colitis/diverticulitis in the left hemipelvis with areas of suspected pericolonic extraluminal air compatible with micro perforations. No significant drainable abscess. No associated obstruction pattern. Large hiatal hernia Abdominal atherosclerosis without aneurysm Complex septated multi-cystic left ovarian 5 cm mass. Recommend nonemergent outpatient GYN referral to assess for indolent cystic ovarian malignancy. Electronically Signed   By: Jerilynn Mages.  Shick M.D.   On: 07/24/2018 19:18    Procedures Procedures (including critical care time)  CRITICAL CARE Performed by: Jillyn Ledger   Total critical care time: 45 minutes - Diverticulitis with microperforation's.   Critical care time was exclusive of separately billable procedures and treating  other patients.  Critical care was necessary to treat or prevent imminent or life-threatening deterioration.  Critical care was time spent personally by me on the following activities: development of treatment plan with patient and/or surrogate as well as nursing, discussions with consultants, evaluation of patient's response to treatment, examination of patient, obtaining history from patient or surrogate, ordering and performing treatments and interventions, ordering and review of laboratory studies, ordering and review of radiographic studies, pulse oximetry and re-evaluation of patient's condition.   Medications Ordered in ED Medications  sodium chloride 0.9 % bolus 500 mL (500 mLs Intravenous New Bag/Given 07/24/18 1908)  ondansetron (ZOFRAN) injection 4 mg (has no administration in time range)  fentaNYL (SUBLIMAZE) injection 50 mcg (has no administration in time range)  meropenem (MERREM) 1 g in sodium chloride 0.9 % 100 mL IVPB (has no administration in time range)  fentaNYL (SUBLIMAZE) injection 50 mcg (50 mcg Intravenous Given 07/24/18 1910)  iohexol (OMNIPAQUE) 300 MG/ML solution 75 mL (75 mLs Intravenous Contrast Given 07/24/18 1847)     Initial Impression / Assessment and Plan / ED Course  I have reviewed the triage vital signs and the nursing notes.  Pertinent labs & imaging results that were available during my care of the patient were reviewed by me and considered in my medical decision making (see chart for details).     84 y.o. female with history of rheumatoid arthritis it is not on any immune suppressive medication, A. fib not on any blood thinners, hypothyroidism, hypertension, hyperlipidemia who presents emergency department today for abdominal pain she stated is worsening over the last 1-2 weeks.  She notes this is okay her left lower quadrant.  She denies history of diverticulitis and has never had a colonoscopy.  She thinks it may be due to her hiatal hernia.  She notes  nausea without emesis.  No fever, diarrhea or constipation.  Initially patient complained of being on chronic steroids and Remicade and coming off of these however this appears to have been greater than 9 months ago.  Do not suspect adrenal crisis.  Abdominal exam without peritoneal signs.  Patient diagnosed with diverticulitis on CT with microperforation.  No abscess noted.  Patient was given IV fluid and pain medication in the department.  Repeat abdominal exam  without peritoneal signs.  Patient started on IV anti-biotics per the order set.  She does have a penicillin allergy so meropenem was chosen.  Labs significant for leukocytosis.  She has baseline kidney function.  I discussed case with Dr. Marcello Moores of general surgery who recommended hospitalist admission and they will follow.  I discussed case with Dr. Tamala Julian of the hospitalist service who will admit.  Patient appears safe for admission.  Patient was noted to have elevated blood pressure as well as heart rate.  She does have a history of A. fib.  EKG with A. fib with a rate of 118.  She was given IV metoprolol with resolution.  Final Clinical Impressions(s) / ED Diagnoses   Final diagnoses:  Diverticulitis of colon  Left ovarian cyst  Paroxysmal atrial fibrillation [facility])    ED Discharge Orders    None       Lorelle Gibbs 07/24/18 2248    Charlesetta Shanks, MD 07/24/18 [phone]

## 2018-07-24 NOTE — ED Provider Notes (Signed)
Medical screening examination/treatment/procedure(s) were conducted as a shared visit with non-physician practitioner(s) and myself.  I personally evaluated the patient during the encounter.  None Patient has had abdominal pain which she indicates is worse in the left lower quadrant.  Pain has been present for a while but distinctly worsening over the last week.  Patient is alert and well in appearance.  Heart irregularly irregular borderline tachycardia.  Lungs clear.  Abdomen soft but moderate to severe left lower quadrant tenderness.  CT confirms diverticulitis with microperforation.  I agree with plan of management.  Patient is nontoxic and has stable appearance.   Charlesetta Shanks, MD 07/24/18 [phone]

## 2018-07-24 NOTE — ED Notes (Signed)
ED TO INPATIENT HANDOFF REPORT  Name/Age/Gender [name] 84 y.o. female  Code Status    Code Status Orders  (From admission, onward)         Start     Ordered   07/24/18 2039  Full code  Continuous     07/24/18 2044        Code Status History    Date Active Date Inactive Code Status Order ID Comments User Context   09/01/2014 2013 09/06/2014 1551 Full Code [id number]  Shanda Howells, MD Inpatient   07/03/2014 0256 07/04/2014 2140 Full Code [id number]  Skeet Latch, MD Inpatient   05/15/2014 1455 05/16/2014 1650 Full Code [id number]  Kelvin Cellar, MD Inpatient   04/09/2014 1952 04/12/2014 1711 Full Code [id number]  Elmarie Shiley, MD Inpatient   04/01/2014 1535 04/06/2014 1430 Full Code [id number]  Allie Bossier, MD ED   01/22/2014 1730 01/23/2014 1339 Full Code [id number]  Thurnell Lose, MD Inpatient      Home/SNF/Other Home  Chief Complaint abdominal pains  Level of Care/Admitting Diagnosis ED Disposition    ED Disposition Condition [facility]: [facility] [[id number]]  Level of Care: Telemetry [5]  Admit to tele based on following criteria: Complex arrhythmia (Bradycardia/Tachycardia)  Diagnosis: Sepsis [facility]) [[id number]]  Admitting Physician: Norval Morton [[id number]]  Attending Physician: Norval Morton [[id number]]  Estimated length of stay: past midnight tomorrow  Certification:: I certify this patient will need inpatient services for at least 2 midnights  PT Class (Do Not Modify): Inpatient [101]  PT Acc Code (Do Not Modify): Private [1]       Medical History Past Medical History:  Diagnosis Date  . A-fib ([place])   . Aortic stenosis, moderate    last echo in 2010; AVA 1.1; mild AS per echo in June 2013  . Edema of both legs    s/p laser treatment per Dr. Donnetta Hutching  . Hiatal hernia   . History of chicken pox   . HLD (hyperlipidemia)   . HTN (hypertension)   . Hypothyroidism   . IBS (irritable bowel syndrome)    . Iron deficiency anemia   . Neuropathic pain 09/06/2015  . Rheumatoid arthritis (HCC)     Allergies Allergies  Allergen Reactions  . Infliximab Other (See Comments)    REACTION: "Enlarged intestines and hernia. Also pushed intestines on lower part of lungs." REACTION: "Enlarged intestines and hernia. Also pushed intestines on lower part of lungs."  . Aspirin Nausea And Vomiting    Low Dose is ok Stomach problems  . Carvedilol Other (See Comments)    Fatigue/ ill Fatigue/ ill  . Codeine Nausea And Vomiting    "Deathly Sick"  . Penicillins Rash    Has patient had a PCN reaction causing immediate rash, facial/tongue/throat swelling, SOB or lightheadedness with hypotension: Y Has patient had a PCN reaction causing severe rash involving mucus membranes or skin necrosis: Y Has patient had a PCN reaction that required hospitalization: N Has patient had a PCN reaction occurring within the last 10 years: N If all of the above answers are "NO", then may proceed with Cephalosporin use.     IV Location/Drains/Wounds Patient Lines/Drains/Airways Status   Active Line/Drains/Airways    Name:   Placement date:   Placement time:   Site:   Days:   Peripheral IV 07/24/18 Left Antecubital   07/24/18    1802    Antecubital   less than 1  Peripheral IV 07/24/18 Right;Upper Forearm   07/24/18    2120    Forearm   less than 1   Wound / Incision (Open or Dehisced) 09/01/14 Other (Comment) Leg Right   09/01/14    -    Leg   1422          Labs/Imaging Results for orders placed or performed during the hospital encounter of 07/24/18 (from the past 48 hour(s))  Comprehensive metabolic panel     Status: Abnormal   Collection Time: 07/24/18  4:56 PM  Result Value Ref Range   Sodium 137 135 - 145 mmol/L   Potassium 4.6 3.5 - 5.1 mmol/L   Chloride 99 98 - 111 mmol/L   CO2 27 22 - 32 mmol/L   Glucose, Bld 121 (H) 70 - 99 mg/dL   BUN 25 (H) 8 - 23 mg/dL   Creatinine, Ser 1.18 (H) 0.44 - 1.00 mg/dL    Calcium 9.1 8.9 - 10.3 mg/dL   Total Protein 8.0 6.5 - 8.1 g/dL   Albumin 3.4 (L) 3.5 - 5.0 g/dL   AST 27 15 - 41 U/L   ALT 15 0 - 44 U/L   Alkaline Phosphatase 88 38 - 126 U/L   Total Bilirubin 0.4 0.3 - 1.2 mg/dL   GFR calc non Af Amer 40 (L) >60 mL/min   GFR calc Af Amer 46 (L) >60 mL/min    Comment: (NOTE) The eGFR has been calculated using the CKD EPI equation. This calculation has not been validated in all clinical situations. eGFR's persistently <60 mL/min signify possible Chronic Kidney Disease.    Anion gap 11 5 - 15    Comment: Performed at [facility], [place] [address], [place], [place] [address]  Lipase, blood     Status: None   Collection Time: 07/24/18  4:56 PM  Result Value Ref Range   Lipase 45 11 - 51 U/L    Comment: Performed at [facility], [place] [address], [place], [place] [address]  CBC with Differential     Status: Abnormal   Collection Time: 07/24/18  4:56 PM  Result Value Ref Range   WBC 17.4 (H) 4.0 - 10.5 K/uL   RBC 4.26 3.87 - 5.11 MIL/uL   Hemoglobin 11.5 (L) 12.0 - 15.0 g/dL   HCT 36.1 36.0 - 46.0 %   MCV 84.7 78.0 - 100.0 fL   MCH 27.0 26.0 - 34.0 pg   MCHC 31.9 30.0 - 36.0 g/dL   RDW 15.2 11.5 - 15.5 %   Platelets 452 (H) 150 - 400 K/uL   Neutrophils Relative % 84 %   Neutro Abs 14.5 (H) 1.7 - 7.7 K/uL   Lymphocytes Relative 10 %   Lymphs Abs 1.8 0.7 - 4.0 K/uL   Monocytes Relative 5 %   Monocytes Absolute 0.9 0.1 - 1.0 K/uL   Eosinophils Relative 1 %   Eosinophils Absolute 0.2 0.0 - 0.7 K/uL   Basophils Relative 0 %   Basophils Absolute 0.0 0.0 - 0.1 K/uL    Comment: Performed at [facility], [place] [address], [place], [place] [address]  Urinalysis, Routine w reflex microscopic     Status: Abnormal   Collection Time: 07/24/18  4:56 PM  Result Value Ref Range   Color, Urine YELLOW YELLOW   APPearance CLEAR CLEAR   Specific Gravity, Urine 1.016 1.005 - 1.030   pH 5.0 5.0 - 8.0    Glucose, UA NEGATIVE NEGATIVE mg/dL  Hgb urine dipstick SMALL (A) NEGATIVE   Bilirubin Urine NEGATIVE NEGATIVE   Ketones, ur NEGATIVE NEGATIVE mg/dL   Protein, ur NEGATIVE NEGATIVE mg/dL   Nitrite NEGATIVE NEGATIVE   Leukocytes, UA MODERATE (A) NEGATIVE   RBC / HPF 6-10 0 - 5 RBC/hpf   WBC, UA 6-10 0 - 5 WBC/hpf   Bacteria, UA NONE SEEN NONE SEEN   Squamous Epithelial / LPF 0-5 0 - 5   Mucus PRESENT     Comment: Performed at [facility], [place] [address], [place], [place] [address]  APTT     Status: Abnormal   Collection Time: 07/24/18  8:37 PM  Result Value Ref Range   aPTT 39 (H) 24 - 36 seconds    Comment:        IF BASELINE aPTT IS ELEVATED, SUGGEST PATIENT RISK ASSESSMENT BE USED TO DETERMINE APPROPRIATE ANTICOAGULANT THERAPY. Performed at [facility], [place] [address] [place], [place] [address]   Protime-INR     Status: None   Collection Time: 07/24/18  8:37 PM  Result Value Ref Range   Prothrombin Time 13.9 11.4 - 15.2 seconds   INR 1.08     Comment: Performed at [facility], [place] [address] [place], [place] [address]  I-Stat CG4 Lactic Acid, ED     Status: None   Collection Time: 07/24/18  8:46 PM  Result Value Ref Range   Lactic Acid, Venous 1.22 0.5 - 1.9 mmol/L   Ct Abdomen Pelvis W Contrast  Result Date: 07/24/2018 CLINICAL DATA:  Acute mid abdominal pain and tenderness for 1 week with nausea and vomiting EXAM: CT ABDOMEN AND PELVIS WITH CONTRAST TECHNIQUE: Multidetector CT imaging of the abdomen and pelvis was performed using the standard protocol following bolus administration of intravenous contrast. CONTRAST:  4m OMNIPAQUE IOHEXOL 300 MG/ML  SOLN COMPARISON:  12/28/2014 upper GI series FINDINGS: Lower chest: Minor basilar scarring. Normal heart size. No pericardial pleural effusion. Large hiatal hernia with the majority of the stomach in the chest. Degenerative changes of the spine. Hepatobiliary: 10 mm  left hepatic hypodensity, suspect small cysts. No significant hepatic abnormality. Gallbladder and biliary system unremarkable. Common bile duct nondilated. Pancreas: Unremarkable. No pancreatic ductal dilatation or surrounding inflammatory changes. Spleen: Inferior splenic subcapsular hypodensity, suspect splenic cyst. No splenic abnormality. Adrenals/Urinary Tract: Normal adrenal glands for age. Kidneys demonstrate no acute perinephric edema, obstruction pattern, hydronephrosis, or obstructive uropathy. Ureters are nondilated. Urinary bladder unremarkable. Stomach/Bowel: Negative for bowel obstruction, significant dilatation, ileus, free air. Extensive colonic diverticulosis. In the pelvis , there is extensive diverticular disease, wall thickening, surrounding edema and pericolonic fluid about the sigmoid colon compatible with sigmoid colitis/diverticulitis. Difficult to exclude underlying mural lesion. Small scattered extraluminal entrapped air about the sigmoid diverticulitis compatible with areas of micro perforation. No drainable fluid collection or abscess. Vascular/Lymphatic: Aortoiliac atherosclerosis noted. No aneurysm or occlusive process. No acute vascular finding. Mesenteric and renal vasculature remain patent. No adenopathy. Reproductive: Uterus is midline mildly prominent size. Right ovary is atrophic. Multi loculated left adnexal ovarian cystic mass measures 4.9 x 3.4 cm, images 51-52. Other: No ascites.  Intact abdominal wall.  No hernia. Musculoskeletal: Degenerative changes of the spine. Bones are osteopenic. Facet arthropathy noted. No acute osseous finding. IMPRESSION: Acute sigmoid colitis/diverticulitis in the left hemipelvis with areas of suspected pericolonic extraluminal air compatible with micro perforations. No significant drainable abscess. No associated obstruction pattern. Large hiatal hernia Abdominal atherosclerosis without aneurysm Complex septated multi-cystic left ovarian 5  cm  mass. Recommend nonemergent outpatient GYN referral to assess for indolent cystic ovarian malignancy. Electronically Signed   By: Jerilynn Mages.  Shick M.D.   On: 07/24/2018 19:18    Pending Labs Unresulted Labs (From admission, onward)    Start     Ordered   07/25/18 0500  CBC  Tomorrow morning,   R     07/24/18 2044   07/25/18 8387  Basic metabolic panel  Tomorrow morning,   R     07/24/18 2044   07/25/18 0500  Magnesium  Tomorrow morning,   R     07/24/18 2044   07/24/18 2055  Procalcitonin  Once,   R     07/24/18 2055   07/24/18 2038  Culture, blood (x 2)  BLOOD CULTURE X 2,   STAT    Comments:  INITIATE ANTIBIOTICS WITHIN 1 HOUR AFTER BLOOD CULTURES DRAWN.  If unable to obtain blood cultures, call MD immediately regarding antibiotic instructions.    07/24/18 2044          Vitals/Pain Today's Vitals   07/24/18 2000 07/24/18 2002 07/24/18 2045 07/24/18 2130  BP: 130/89 130/89 99/79 (!) 94/52  Pulse: 100 (!) 115 89 (!) 134  Resp: (!) 22 19 (!) 21 (!) 23  Temp:      TempSrc:      SpO2: 97% 95% (!) 86% 96%    Isolation Precautions No active isolations  Medications Medications  meropenem (MERREM) 1 g in sodium chloride 0.9 % 100 mL IVPB (1 g Intravenous New Bag/Given 07/24/18 2126)  enoxaparin (LOVENOX) injection 30 mg (has no administration in time range)  sodium chloride flush (NS) 0.9 % injection 3 mL (has no administration in time range)  0.9 %  sodium chloride infusion (has no administration in time range)  acetaminophen (TYLENOL) tablet 650 mg (has no administration in time range)    Or  acetaminophen (TYLENOL) suppository 650 mg (has no administration in time range)  ondansetron (ZOFRAN) tablet 4 mg (has no administration in time range)    Or  ondansetron (ZOFRAN) injection 4 mg (has no administration in time range)  albuterol (PROVENTIL) (2.5 MG/3ML) 0.083% nebulizer solution 2.5 mg (has no administration in time range)  irbesartan (AVAPRO) tablet 150 mg (has no  administration in time range)  ALPRAZolam (XANAX) tablet 0.25 mg (has no administration in time range)  levothyroxine (SYNTHROID, LEVOTHROID) tablet 75 mcg (has no administration in time range)  gabapentin (NEURONTIN) capsule 100 mg (has no administration in time range)  olopatadine (PATANOL) 0.1 % ophthalmic solution 1 drop (has no administration in time range)  aspirin chewable tablet 81 mg (has no administration in time range)  fentaNYL (SUBLIMAZE) injection 25 mcg (has no administration in time range)  sodium chloride 0.9 % bolus 500 mL (0 mLs Intravenous Stopped 07/24/18 2006)  ondansetron (ZOFRAN) injection 4 mg (4 mg Intravenous Given by Other 07/24/18 2126)  fentaNYL (SUBLIMAZE) injection 50 mcg (50 mcg Intravenous Given 07/24/18 1910)  iohexol (OMNIPAQUE) 300 MG/ML solution 75 mL (75 mLs Intravenous Contrast Given 07/24/18 1847)  fentaNYL (SUBLIMAZE) injection 50 mcg (50 mcg Intravenous Given 07/24/18 2029)  sodium chloride 0.9 % bolus 500 mL (500 mLs Intravenous New Bag/Given 07/24/18 2029)  metoprolol tartrate (LOPRESSOR) injection 5 mg (5 mg Intravenous Given 07/24/18 2029)    Mobility walks with person assist

## 2018-07-24 NOTE — Consult Note (Signed)
Reason for Consult: perforated diverticulitis  Referring Physician: Dr Madilyn Fireman [name] is an 84 y.o. female.  HPI: Pt with a history of rheumatoid arthritis (currently untreated), A. fib, hypertension, hyperlipidemia who presents emergency department today for abdominal pain.  She reports chronic pain over the last several months that worsened over the last 1-2 weeks.  She notes that her pain is mainly in her left lower quadrant but at times it becomes more generalized.  She has been taking Tylenol for symptoms without any relief.  She denies any associated fever, chills, chest pain, shortness of breath, cough, flank pain, urinary symptoms, nausea/vomiting/diarrhea.  She reports a normal bowel movement earlier today.  No melena or hematochezia.  PSH sig for previous appendectomy.  No prior colonoscopy. She is not on any blood thinners.  She states she saw a Psychologist, sport and exercise a couple years ago and he felt she was not a candidate for surgery.  Past Medical History:  Diagnosis Date  . A-fib ([place])   . Aortic stenosis, moderate    last echo in 2010; AVA 1.1; mild AS per echo in June 2013  . Edema of both legs    s/p laser treatment per Dr. Donnetta Hutching  . Hiatal hernia   . History of chicken pox   . HLD (hyperlipidemia)   . HTN (hypertension)   . Hypothyroidism   . IBS (irritable bowel syndrome)   . Iron deficiency anemia   . Neuropathic pain 09/06/2015  . Rheumatoid arthritis [facility])     Past Surgical History:  Procedure Laterality Date  . APPENDECTOMY  1955  . CARPAL TUNNEL RELEASE Right   . CATARACT EXTRACTION    . ENDOSCOPIC VEIN LASER TREATMENT    . ENDOVENOUS ABLATION SAPHENOUS VEIN W/ LASER  08-29-2012   left greater saphenous vein   Sherren Mocha Early MD  . ENDOVENOUS ABLATION SAPHENOUS VEIN W/ LASER  09-19-2012   right greater saphenous vein by Curt Jews MD  . EYE SURGERY  2005   Bilateral cataract  . FOOT SURGERY  2003   bilateral Hammer Toe  . stab phlebectomy Right 01-02-2013    10-15 incisions right thigh and calf by Curt Jews MD    Family History  Problem Relation Age of Onset  . Heart disease Mother   . Peripheral vascular disease Mother   . Heart disease Father   . Peripheral vascular disease Father        Right leg amputation  . COPD Father   . Heart disease Brother 72       Heart Disease before age 41  . Heart attack Brother   . Peripheral vascular disease Unknown   . Hypertension Sister     Social History:  reports that she quit smoking about 34 years ago. Her smoking use included cigarettes. She has never used smokeless tobacco. She reports that she does not drink alcohol or use drugs.  Allergies:  Allergies  Allergen Reactions  . Infliximab Other (See Comments)    REACTION: "Enlarged intestines and hernia. Also pushed intestines on lower part of lungs." REACTION: "Enlarged intestines and hernia. Also pushed intestines on lower part of lungs."  . Aspirin Nausea And Vomiting    Low Dose is ok Stomach problems  . Carvedilol Other (See Comments)    Fatigue/ ill Fatigue/ ill  . Codeine Nausea And Vomiting    "Deathly Sick"  . Penicillins Rash    Has patient had a PCN reaction causing immediate rash, facial/tongue/throat swelling, SOB or lightheadedness  with hypotension: Y Has patient had a PCN reaction causing severe rash involving mucus membranes or skin necrosis: Y Has patient had a PCN reaction that required hospitalization: N Has patient had a PCN reaction occurring within the last 10 years: N If all of the above answers are "NO", then may proceed with Cephalosporin use.     Medications: I have reviewed the patient's current medications.  Results for orders placed or performed during the hospital encounter of 07/24/18 (from the past 48 hour(s))  Comprehensive metabolic panel     Status: Abnormal   Collection Time: 07/24/18  4:56 PM  Result Value Ref Range   Sodium 137 135 - 145 mmol/L   Potassium 4.6 3.5 - 5.1 mmol/L   Chloride 99  98 - 111 mmol/L   CO2 27 22 - 32 mmol/L   Glucose, Bld 121 (H) 70 - 99 mg/dL   BUN 25 (H) 8 - 23 mg/dL   Creatinine, Ser 1.18 (H) 0.44 - 1.00 mg/dL   Calcium 9.1 8.9 - 10.3 mg/dL   Total Protein 8.0 6.5 - 8.1 g/dL   Albumin 3.4 (L) 3.5 - 5.0 g/dL   AST 27 15 - 41 U/L   ALT 15 0 - 44 U/L   Alkaline Phosphatase 88 38 - 126 U/L   Total Bilirubin 0.4 0.3 - 1.2 mg/dL   GFR calc non Af Amer 40 (L) >60 mL/min   GFR calc Af Amer 46 (L) >60 mL/min    Comment: (NOTE) The eGFR has been calculated using the CKD EPI equation. This calculation has not been validated in all clinical situations. eGFR's persistently <60 mL/min signify possible Chronic Kidney Disease.    Anion gap 11 5 - 15    Comment: Performed at [facility], [place] [address], [place], [place] [address]  Lipase, blood     Status: None   Collection Time: 07/24/18  4:56 PM  Result Value Ref Range   Lipase 45 11 - 51 U/L    Comment: Performed at [facility], [place] [address], [place], [place] [address]  CBC with Differential     Status: Abnormal   Collection Time: 07/24/18  4:56 PM  Result Value Ref Range   WBC 17.4 (H) 4.0 - 10.5 K/uL   RBC 4.26 3.87 - 5.11 MIL/uL   Hemoglobin 11.5 (L) 12.0 - 15.0 g/dL   HCT 36.1 36.0 - 46.0 %   MCV 84.7 78.0 - 100.0 fL   MCH 27.0 26.0 - 34.0 pg   MCHC 31.9 30.0 - 36.0 g/dL   RDW 15.2 11.5 - 15.5 %   Platelets 452 (H) 150 - 400 K/uL   Neutrophils Relative % 84 %   Neutro Abs 14.5 (H) 1.7 - 7.7 K/uL   Lymphocytes Relative 10 %   Lymphs Abs 1.8 0.7 - 4.0 K/uL   Monocytes Relative 5 %   Monocytes Absolute 0.9 0.1 - 1.0 K/uL   Eosinophils Relative 1 %   Eosinophils Absolute 0.2 0.0 - 0.7 K/uL   Basophils Relative 0 %   Basophils Absolute 0.0 0.0 - 0.1 K/uL    Comment: Performed at [facility], [place] [address] [place], [place] [address]  Urinalysis, Routine w reflex microscopic     Status: Abnormal   Collection Time: 07/24/18  4:56  PM  Result Value Ref Range   Color, Urine YELLOW YELLOW   APPearance CLEAR CLEAR   Specific Gravity, Urine 1.016 1.005 - 1.030   pH  5.0 5.0 - 8.0   Glucose, UA NEGATIVE NEGATIVE mg/dL   Hgb urine dipstick SMALL (A) NEGATIVE   Bilirubin Urine NEGATIVE NEGATIVE   Ketones, ur NEGATIVE NEGATIVE mg/dL   Protein, ur NEGATIVE NEGATIVE mg/dL   Nitrite NEGATIVE NEGATIVE   Leukocytes, UA MODERATE (A) NEGATIVE   RBC / HPF 6-10 0 - 5 RBC/hpf   WBC, UA 6-10 0 - 5 WBC/hpf   Bacteria, UA NONE SEEN NONE SEEN   Squamous Epithelial / LPF 0-5 0 - 5   Mucus PRESENT     Comment: Performed at [facility], [place] [address] [place], [place] [address]    Ct Abdomen Pelvis W Contrast  Result Date: 07/24/2018 CLINICAL DATA:  Acute mid abdominal pain and tenderness for 1 week with nausea and vomiting EXAM: CT ABDOMEN AND PELVIS WITH CONTRAST TECHNIQUE: Multidetector CT imaging of the abdomen and pelvis was performed using the standard protocol following bolus administration of intravenous contrast. CONTRAST:  34m OMNIPAQUE IOHEXOL 300 MG/ML  SOLN COMPARISON:  12/28/2014 upper GI series FINDINGS: Lower chest: Minor basilar scarring. Normal heart size. No pericardial pleural effusion. Large hiatal hernia with the majority of the stomach in the chest. Degenerative changes of the spine. Hepatobiliary: 10 mm left hepatic hypodensity, suspect small cysts. No significant hepatic abnormality. Gallbladder and biliary system unremarkable. Common bile duct nondilated. Pancreas: Unremarkable. No pancreatic ductal dilatation or surrounding inflammatory changes. Spleen: Inferior splenic subcapsular hypodensity, suspect splenic cyst. No splenic abnormality. Adrenals/Urinary Tract: Normal adrenal glands for age. Kidneys demonstrate no acute perinephric edema, obstruction pattern, hydronephrosis, or obstructive uropathy. Ureters are nondilated. Urinary bladder unremarkable. Stomach/Bowel: Negative for bowel  obstruction, significant dilatation, ileus, free air. Extensive colonic diverticulosis. In the pelvis , there is extensive diverticular disease, wall thickening, surrounding edema and pericolonic fluid about the sigmoid colon compatible with sigmoid colitis/diverticulitis. Difficult to exclude underlying mural lesion. Small scattered extraluminal entrapped air about the sigmoid diverticulitis compatible with areas of micro perforation. No drainable fluid collection or abscess. Vascular/Lymphatic: Aortoiliac atherosclerosis noted. No aneurysm or occlusive process. No acute vascular finding. Mesenteric and renal vasculature remain patent. No adenopathy. Reproductive: Uterus is midline mildly prominent size. Right ovary is atrophic. Multi loculated left adnexal ovarian cystic mass measures 4.9 x 3.4 cm, images 51-52. Other: No ascites.  Intact abdominal wall.  No hernia. Musculoskeletal: Degenerative changes of the spine. Bones are osteopenic. Facet arthropathy noted. No acute osseous finding. IMPRESSION: Acute sigmoid colitis/diverticulitis in the left hemipelvis with areas of suspected pericolonic extraluminal air compatible with micro perforations. No significant drainable abscess. No associated obstruction pattern. Large hiatal hernia Abdominal atherosclerosis without aneurysm Complex septated multi-cystic left ovarian 5 cm mass. Recommend nonemergent outpatient GYN referral to assess for indolent cystic ovarian malignancy. Electronically Signed   By: MJerilynn Mages  Shick M.D.   On: 07/24/2018 19:18    Review of Systems  Constitutional: Negative for chills and fever.  HENT: Negative for hearing loss.   Eyes: Negative for blurred vision.  Respiratory: Negative for cough and shortness of breath.   Cardiovascular: Negative for chest pain and palpitations.  Gastrointestinal: Positive for abdominal pain. Negative for blood in stool, constipation, diarrhea, nausea and vomiting.  Genitourinary: Negative for dysuria,  frequency and urgency.  Neurological: Negative for dizziness and headaches.   Blood pressure 130/89, pulse (!) 115, temperature 98.6 F (37 C), temperature source Oral, resp. rate 19, SpO2 95 %. Physical Exam  Constitutional: She is oriented to person, place, and time. She appears well-developed and well-nourished. No  distress.  HENT:  Head: Normocephalic and atraumatic.  Eyes: Pupils are equal, round, and reactive to light. Conjunctivae and EOM are normal.  Neck: Normal range of motion. Neck supple.  Cardiovascular: Normal rate and regular rhythm.  Respiratory: Effort normal and breath sounds normal. No respiratory distress.  GI: Soft. She exhibits no distension. There is tenderness (LLQ).  Neurological: She is alert and oriented to person, place, and time.  Skin: Skin is warm and dry. She is not diaphoretic.    Assessment/Plan: Perforated diverticulitis.  NPO.  IVF's.  IV antibiotics.  Will follow with you.  Will most likely need a repeat CT to eval her fluid collection.   Chey Cho C. 0/15/9968, 9:57 PM

## 2018-07-24 NOTE — Progress Notes (Signed)
A consult was received from an ED physician for meropenem per pharmacy dosing.  The patient's profile has been reviewed for ht/wt/allergies/indication/available labs.   A one time order has been placed for meropenem 1 g.  Further antibiotics/pharmacy consults should be ordered by admitting physician if indicated.                       Thank you, Napoleon Form 07/24/2018  7:40 PM

## 2018-07-24 NOTE — Discharge Instructions (Addendum)
Please see OBGYN in regards to the Cyst that was found on CT.

## 2018-07-24 NOTE — Progress Notes (Signed)
Pharmacy Antibiotic Note  [name] is a 84 y.o. female admitted on 07/24/2018 with perforated diverticulitis.  Pharmacy has been consulted for Meropenem dosing.  SCr 1.18, CrCl ~ 30.5 ml/min WBC 17.4 Afebrile  Plan:  Meropenem 1g IV q12h  Follow up renal fxn, culture results, and clinical course.  F/u ability to de-escalate antibiotics.      Temp (24hrs), Avg:98.6 F (37 C), Min:98.6 F (37 C), Max:98.6 F (37 C)  Recent Labs  Lab 07/24/18 1656 07/24/18 2046  WBC 17.4*  --   CREATININE 1.18*  --   LATICACIDVEN  --  1.22    CrCl cannot be calculated (Unknown ideal weight.).    Allergies  Allergen Reactions  . Infliximab Other (See Comments)    REACTION: "Enlarged intestines and hernia. Also pushed intestines on lower part of lungs." REACTION: "Enlarged intestines and hernia. Also pushed intestines on lower part of lungs."  . Aspirin Nausea And Vomiting    Low Dose is ok Stomach problems  . Carvedilol Other (See Comments)    Fatigue/ ill Fatigue/ ill  . Codeine Nausea And Vomiting    "Deathly Sick"  . Penicillins Rash    Has patient had a PCN reaction causing immediate rash, facial/tongue/throat swelling, SOB or lightheadedness with hypotension: Y Has patient had a PCN reaction causing severe rash involving mucus membranes or skin necrosis: Y Has patient had a PCN reaction that required hospitalization: N Has patient had a PCN reaction occurring within the last 10 years: N If all of the above answers are "NO", then may proceed with Cephalosporin use.     Antimicrobials this admission: 9/25 Meropenem >>   Dose adjustments this admission:  Microbiology results: 9/25 BCx:  Thank you for allowing pharmacy to be a part of this patient's care.  Gretta Arab PharmD, BCPS Pager [phone] 07/24/2018 9:59 PM

## 2018-07-24 NOTE — H&P (Addendum)
History and Physical    [name] TDV:[id number] DOB: 06-11-1927 DOA: 07/24/2018  Referring MD/NP/PA: Alferd Apa, PA-C PCP: [name], NP  Patient coming from: home  Chief Complaint: Abdominal pain  I have personally briefly reviewed patient's old medical records in [place]   HPI: [name] is a 84 y.o. female with medical history significant of HTN, HLD, A. fib not on anticoagulation, AS, hypothyroidism, OA, and rheumatoid arthritis; who presents with complaints of abdominal pain worse last 1 week.  She reports generalized stomach pains off and on over the last 2 years.  She describes a generalized abdominal pain that is worse on the left lower quadrant at times that feels like pressure.  She tried medications given to her by her primary care provider such as Bentyl, Tylenol, and buttermilk with mild relief of symptoms.  She reports never previously having a colonoscopy.  Currently reports having 1-2 bowel movements per day on average, and denies any blood present in stools.  Other associated symptoms include decreased appetite.  Denies having fever, chills, nausea, vomiting, diarrhea, chest pain, or shortness of breath.  She notes a history of atrial fibrillation that appears to be chronic.  Given her risk factors she was recommended to be on Eliquis, but after hearing potential side effects declined.  She currently takes a 81 mg daily aspirin and complaints of nausea and vomiting with a 325 mg aspirin.  Furthermore, she notes a history of rheumatoid arthritis previously treated with Remicade and steroids.  She discontinued treatments, after finding out that the medications were "chemotherapy" drugs and possibly making her hiatal hernia worse.   ED Course: Upon admission into the emergency department patient was found to be afebrile, pulse 82-1 15, respirations 15-19, blood pressures 130/89-178/97, and O2 saturation maintained on room air.  Labs revealed  WBC 17.4, hemoglobin 11.5, platelets 452, BUN 25, and creatinine 1.18.  CT scan showed inflammation of the sigmoid colon with signs of microperforation. Patient was ordered 1 L normal saline IV fluids, 4 mg of Zofran, 5 mg of metoprolol IV for heart rates, total of 100 mcg of fentanyl for pain, and IV antibiotics of meropenem.   Dr. Marcello Moores of general surgery was consulted.  [facility] called to admit.  Review of Systems  Constitutional: Positive for malaise/fatigue. Negative for chills and fever.  HENT: Negative for congestion and nosebleeds.   Eyes: Negative for pain and discharge.  Respiratory: Negative for cough and shortness of breath.   Cardiovascular: Negative for chest pain and leg swelling.  Gastrointestinal: Positive for abdominal pain. Negative for blood in stool and diarrhea.  Genitourinary: Negative for dysuria and frequency.  Musculoskeletal: Positive for joint pain. Negative for falls.  Skin: Negative for itching and rash.  Neurological: Negative for focal weakness and loss of consciousness.  Psychiatric/Behavioral: Negative for memory loss and substance abuse.    Past Medical History:  Diagnosis Date  . A-fib ([place])   . Aortic stenosis, moderate    last echo in 2010; AVA 1.1; mild AS per echo in June 2013  . Edema of both legs    s/p laser treatment per Dr. Donnetta Hutching  . Hiatal hernia   . History of chicken pox   . HLD (hyperlipidemia)   . HTN (hypertension)   . Hypothyroidism   . IBS (irritable bowel syndrome)   . Iron deficiency anemia   . Neuropathic pain 09/06/2015  . Rheumatoid arthritis [facility])     Past Surgical History:  Procedure Laterality  Date  . APPENDECTOMY  1955  . CARPAL TUNNEL RELEASE Right   . CATARACT EXTRACTION    . ENDOSCOPIC VEIN LASER TREATMENT    . ENDOVENOUS ABLATION SAPHENOUS VEIN W/ LASER  08-29-2012   left greater saphenous vein   Sherren Mocha Early MD  . ENDOVENOUS ABLATION SAPHENOUS VEIN W/ LASER  09-19-2012   right greater saphenous vein by Curt Jews MD   . EYE SURGERY  2005   Bilateral cataract  . FOOT SURGERY  2003   bilateral Hammer Toe  . stab phlebectomy Right 01-02-2013   10-15 incisions right thigh and calf by Curt Jews MD     reports that she quit smoking about 34 years ago. Her smoking use included cigarettes. She has never used smokeless tobacco. She reports that she does not drink alcohol or use drugs.  Allergies  Allergen Reactions  . Infliximab Other (See Comments)    REACTION: "Enlarged intestines and hernia. Also pushed intestines on lower part of lungs." REACTION: "Enlarged intestines and hernia. Also pushed intestines on lower part of lungs."  . Aspirin Nausea And Vomiting    Low Dose is ok Stomach problems  . Carvedilol Other (See Comments)    Fatigue/ ill Fatigue/ ill  . Codeine Nausea And Vomiting    "Deathly Sick"  . Penicillins Rash    Has patient had a PCN reaction causing immediate rash, facial/tongue/throat swelling, SOB or lightheadedness with hypotension: Y Has patient had a PCN reaction causing severe rash involving mucus membranes or skin necrosis: Y Has patient had a PCN reaction that required hospitalization: N Has patient had a PCN reaction occurring within the last 10 years: N If all of the above answers are "NO", then may proceed with Cephalosporin use.     Family History  Problem Relation Age of Onset  . Heart disease Mother   . Peripheral vascular disease Mother   . Heart disease Father   . Peripheral vascular disease Father        Right leg amputation  . COPD Father   . Heart disease Brother 53       Heart Disease before age 78  . Heart attack Brother   . Peripheral vascular disease Unknown   . Hypertension Sister     Prior to Admission medications   Medication Sig Start Date End Date Taking? Authorizing Provider  gabapentin (NEURONTIN) 100 MG capsule Take 1 capsule (100 mg total) by mouth at bedtime. 07/03/18  Yes Nche, Charlene Brooke, NP  levothyroxine (SYNTHROID, LEVOTHROID) 75  MCG tablet Take 1 tablet (75 mcg total) by mouth daily before breakfast. 07/03/18  Yes Nche, Charlene Brooke, NP  olmesartan (BENICAR) 20 MG tablet Take 1 tablet (20 mg total) by mouth daily. 07/10/18  Yes Nche, Charlene Brooke, NP  Olopatadine HCl 0.2 % SOLN Place 1 drop into both eyes daily.  07/20/18  Yes [provider]  thiamine (VITAMIN B-1) 100 MG tablet Take 100 mg by mouth daily.   Yes [provider]  XANAX 0.25 MG tablet Take 1 tablet (0.25 mg total) by mouth at bedtime as needed for anxiety. Patient taking differently: Take 0.25 mg by mouth at bedtime.  07/02/18  Yes Nche, Charlene Brooke, NP  acetaminophen (TYLENOL) 500 MG tablet Take 1,000 mg by mouth every 6 (six) hours as needed (joint pain).     [provider]  cholecalciferol (VITAMIN D) 1000 units tablet Take 1 tablet (1,000 Units total) by mouth daily. Patient not taking: Reported on 05/22/2018 04/30/18  Nche, Charlene Brooke, NP  dicyclomine (BENTYL) 20 MG tablet Take 0.5 tablets (10 mg total) by mouth daily as needed for spasms. Patient not taking: Reported on 07/24/2018 02/06/18   Nche, Charlene Brooke, NP    Physical Exam:  Constitutional: Elderly Female NAD, calm, comfortable Vitals:   07/24/18 1711 07/24/18 1715 07/24/18 2002  BP: (!) 178/97 (!) 166/112 130/89  Pulse: 82  (!) 115  Resp: 15  19  Temp: 98.6 F (37 C)    TempSrc: Oral    SpO2: 100%  95%   Eyes: PERRL, lids and conjunctivae normal ENMT: Mucous membranes are moist. Posterior pharynx clear of any exudate or lesions. Dry mucous membranes.  Neck: normal, supple, no masses, no thyromegaly Respiratory: clear to auscultation bilaterally, no wheezing, no crackles. Normal respiratory effort. No accessory muscle use.  Cardiovascular: Irregularly irregular, no murmurs / rubs / gallops. No extremity edema. 2+ pedal pulses. No carotid bruits.  Abdomen: Tenderness to palpation of the left lower quadrant, no masses palpated. No hepatosplenomegaly. Bowel  sounds positive.  Musculoskeletal: no clubbing / cyanosis. No joint deformity upper and lower extremities. Good ROM, no contractures. Normal muscle tone.  Skin: no rashes, lesions, ulcers. No induration Neurologic: CN 2-12 grossly intact. Sensation intact, DTR normal. Strength 5/5 in all 4.  Psychiatric: Normal judgment and insight. Alert and oriented x 3. Normal mood.     Labs on Admission: I have personally reviewed following labs and imaging studies  CBC: Recent Labs  Lab 07/24/18 1656  WBC 17.4*  NEUTROABS 14.5*  HGB 11.5*  HCT 36.1  MCV 84.7  PLT 500*   Basic Metabolic Panel: Recent Labs  Lab 07/24/18 1656  NA 137  K 4.6  CL 99  CO2 27  GLUCOSE 121*  BUN 25*  CREATININE 1.18*  CALCIUM 9.1   GFR: CrCl cannot be calculated (Unknown ideal weight.). Liver Function Tests: Recent Labs  Lab 07/24/18 1656  AST 27  ALT 15  ALKPHOS 88  BILITOT 0.4  PROT 8.0  ALBUMIN 3.4*   Recent Labs  Lab 07/24/18 1656  LIPASE 45   No results for input(s): AMMONIA in the last 168 hours. Coagulation Profile: No results for input(s): INR, PROTIME in the last 168 hours. Cardiac Enzymes: No results for input(s): CKTOTAL, CKMB, CKMBINDEX, TROPONINI in the last 168 hours. BNP (last 3 results) No results for input(s): PROBNP in the last 8760 hours. HbA1C: No results for input(s): HGBA1C in the last 72 hours. CBG: No results for input(s): GLUCAP in the last 168 hours. Lipid Profile: No results for input(s): CHOL, HDL, LDLCALC, TRIG, CHOLHDL, LDLDIRECT in the last 72 hours. Thyroid Function Tests: No results for input(s): TSH, T4TOTAL, FREET4, T3FREE, THYROIDAB in the last 72 hours. Anemia Panel: No results for input(s): VITAMINB12, FOLATE, FERRITIN, TIBC, IRON, RETICCTPCT in the last 72 hours. Urine analysis:    Component Value Date/Time   COLORURINE YELLOW 07/24/2018 1656   APPEARANCEUR CLEAR 07/24/2018 1656   LABSPEC 1.016 07/24/2018 1656   PHURINE 5.0 07/24/2018 1656     GLUCOSEU NEGATIVE 07/24/2018 1656   HGBUR SMALL (A) 07/24/2018 1656   BILIRUBINUR NEGATIVE 07/24/2018 1656   KETONESUR NEGATIVE 07/24/2018 1656   PROTEINUR NEGATIVE 07/24/2018 1656   UROBILINOGEN 0.2 07/03/2014 0313   NITRITE NEGATIVE 07/24/2018 1656   LEUKOCYTESUR MODERATE (A) 07/24/2018 1656   Sepsis Labs: No results found for this or any previous visit (from the past 240 hour(s)).   Radiological Exams on Admission: Ct Abdomen Pelvis W Contrast  Result Date: 07/24/2018 CLINICAL DATA:  Acute mid abdominal pain and tenderness for 1 week with nausea and vomiting EXAM: CT ABDOMEN AND PELVIS WITH CONTRAST TECHNIQUE: Multidetector CT imaging of the abdomen and pelvis was performed using the standard protocol following bolus administration of intravenous contrast. CONTRAST:  35mL OMNIPAQUE IOHEXOL 300 MG/ML  SOLN COMPARISON:  12/28/2014 upper GI series FINDINGS: Lower chest: Minor basilar scarring. Normal heart size. No pericardial pleural effusion. Large hiatal hernia with the majority of the stomach in the chest. Degenerative changes of the spine. Hepatobiliary: 10 mm left hepatic hypodensity, suspect small cysts. No significant hepatic abnormality. Gallbladder and biliary system unremarkable. Common bile duct nondilated. Pancreas: Unremarkable. No pancreatic ductal dilatation or surrounding inflammatory changes. Spleen: Inferior splenic subcapsular hypodensity, suspect splenic cyst. No splenic abnormality. Adrenals/Urinary Tract: Normal adrenal glands for age. Kidneys demonstrate no acute perinephric edema, obstruction pattern, hydronephrosis, or obstructive uropathy. Ureters are nondilated. Urinary bladder unremarkable. Stomach/Bowel: Negative for bowel obstruction, significant dilatation, ileus, free air. Extensive colonic diverticulosis. In the pelvis , there is extensive diverticular disease, wall thickening, surrounding edema and pericolonic fluid about the sigmoid colon compatible with sigmoid  colitis/diverticulitis. Difficult to exclude underlying mural lesion. Small scattered extraluminal entrapped air about the sigmoid diverticulitis compatible with areas of micro perforation. No drainable fluid collection or abscess. Vascular/Lymphatic: Aortoiliac atherosclerosis noted. No aneurysm or occlusive process. No acute vascular finding. Mesenteric and renal vasculature remain patent. No adenopathy. Reproductive: Uterus is midline mildly prominent size. Right ovary is atrophic. Multi loculated left adnexal ovarian cystic mass measures 4.9 x 3.4 cm, images 51-52. Other: No ascites.  Intact abdominal wall.  No hernia. Musculoskeletal: Degenerative changes of the spine. Bones are osteopenic. Facet arthropathy noted. No acute osseous finding. IMPRESSION: Acute sigmoid colitis/diverticulitis in the left hemipelvis with areas of suspected pericolonic extraluminal air compatible with micro perforations. No significant drainable abscess. No associated obstruction pattern. Large hiatal hernia Abdominal atherosclerosis without aneurysm Complex septated multi-cystic left ovarian 5 cm mass. Recommend nonemergent outpatient GYN referral to assess for indolent cystic ovarian malignancy. Electronically Signed   By: Jerilynn Mages.  Shick M.D.   On: 07/24/2018 19:18    EKG: Independently reviewed.  Atrial fibrillation at 118 bpm  Assessment/Plan SIRS/sepsis secondary to sigmoid diverticulitis/colitis: Acute.  Patient presented with tachycardia and elevated WBC count of 17.4.  Initial lactic acid was not obtained.  CT imaging showing acute sigmoid diverticulitis/colitis with microperforations.  Patient treated empirically with meropenem. - Admit to telemetry - Check i-STAT lactic acid (1.22) - Check blood cultures - N.p.o. except for ice chips and meds - Continue meropenem per pharmacy - Fentanyl IV as needed pain - Normal saline IV fluids at 75 mL/h  Atrial fibrillation: Patient presents with heart rates up to 115.  She is  not on any rate controlling medications.  Also not on anticoagulation due to patient preference.  Given 5 mg of metoprolol IV. - Continue aspirin   Renal insufficiency: Acute.  Initial creatinine noted to be 1.18 with BUN 25.  Elevated BUN to creatinine ratio suggest a prerenal cause of symptoms.  Patient with previously noted creatinines within normal limits earlier this year. - Continue IV fluids normal saline as tolerated - Recheck renal function in a.m.  Left ovarian cyst/mass: Incidental finding on CT imaging measuring 4.9 x 3.4 cm - May warrant further evaluation  Rheumatoid arthritis: Patient currently not on any immunosuppressive therapies.  Had reactions to infliximab in the past. - Continue to monitor  Essential hypertension - Continue olmesartan  Hypothyroidism - Continue levothyroxine  Normocytic anemia: Acute.  Initial hemoglobin 11.5 on admission. - Recheck CBC in a.m.  Thrombocytosis: acute.  Platelet count elevated to 452 on admission.  Suspect reactive to underlying process. - Continue to monitor  Hiatal hernia: Chronic. - Protonix  DVT prophylaxis: Lovenox Code Status: Full Family Communication: No family present at bedside Disposition Plan: Likely discharge home once medically stable Consults called: surgery  Admission status: Inpatient   Norval Morton MD [facility] Pager [phone]   If 7PM-7AM, please contact night-coverage [email or web address] Password [email or web address]  07/24/2018, 8:20 PM

## 2018-07-25 ENCOUNTER — Encounter (HOSPITAL_COMMUNITY): Payer: Self-pay

## 2018-07-25 DIAGNOSIS — N83202 Unspecified ovarian cyst, left side: Secondary | ICD-10-CM

## 2018-07-25 DIAGNOSIS — D473 Essential (hemorrhagic) thrombocythemia: Secondary | ICD-10-CM | POA: Diagnosis present

## 2018-07-25 DIAGNOSIS — K5720 Diverticulitis of large intestine with perforation and abscess without bleeding: Secondary | ICD-10-CM

## 2018-07-25 LAB — BASIC METABOLIC PANEL
Anion gap: 7 (ref 5–15)
BUN: 20 mg/dL (ref 8–23)
CO2: 25 mmol/L (ref 22–32)
Calcium: 8.4 mg/dL — ABNORMAL LOW (ref 8.9–10.3)
Chloride: 104 mmol/L (ref 98–111)
Creatinine, Ser: 1.06 mg/dL — ABNORMAL HIGH (ref 0.44–1.00)
GFR calc Af Amer: 52 mL/min — ABNORMAL LOW (ref >60)
GFR calc non Af Amer: 45 mL/min — ABNORMAL LOW (ref >60)
Glucose, Bld: 93 mg/dL (ref 70–99)
Potassium: 4.5 mmol/L (ref 3.5–5.1)
Sodium: 136 mmol/L (ref 135–145)

## 2018-07-25 LAB — CBC
HCT: 31.4 % — ABNORMAL LOW (ref 36.0–46.0)
Hemoglobin: 9.9 g/dL — ABNORMAL LOW (ref 12.0–15.0)
MCH: 27.1 pg (ref 26.0–34.0)
MCHC: 31.5 g/dL (ref 30.0–36.0)
MCV: 86 fL (ref 78.0–100.0)
Platelets: 390 10*3/uL (ref 150–400)
RBC: 3.65 MIL/uL — ABNORMAL LOW (ref 3.87–5.11)
RDW: 15.5 % (ref 11.5–15.5)
WBC: 11.7 10*3/uL — ABNORMAL HIGH (ref 4.0–10.5)

## 2018-07-25 LAB — PROCALCITONIN: Procalcitonin: <0.1 ng/mL

## 2018-07-25 LAB — MAGNESIUM: Magnesium: 1.8 mg/dL (ref 1.7–2.4)

## 2018-07-25 MED ORDER — METOPROLOL TARTRATE 5 MG/5ML IV SOLN
5.0000 mg | INTRAVENOUS | Status: AC | PRN
  Administered 2018-07-25 – 2018-07-28 (×2): 5 mg via INTRAVENOUS
  Filled 2018-07-25 (×2): qty 5

## 2018-07-25 NOTE — Progress Notes (Addendum)
PROGRESS NOTE    [name]  DPO:[id number] DOB: 31-Aug-1927 DOA: 07/24/2018 PCP: [name], NP    Brief Narrative:   [name] is a 84 y.o. female with medical history significant of HTN, HLD, A. fib not on anticoagulation, AS, hypothyroidism, OA, and rheumatoid arthritis; who presents with complaints of abdominal pain worse last 1 week. CT scan showed inflammation of the sigmoid colon with signs of microperforation. She was admitted for acute diverticulitis and surgery consulted for recommendations.    Assessment & Plan:   Active Problems:   Hypothyroidism   Atrial fibrillation, chronic (HCC)   Essential hypertension   Normocytic normochromic anemia   Sepsis ([place])   Sigmoid diverticulitis   Thrombocytosis ([place])   Acute sigmoid diverticulitis:  Admitted for IV antibiotics, IV fluids and IV anti emetics.  Bowel rest with NPO.  Surgery consulted and recommendations given.  Pain control.    Chronic atrial fibrillation;  Rate controlled.     AKI;  Suspect from dehydration.  Continue with IV fluids. Her creatinine improved with IV fluids.  Recheck bmp in am.    RA: Appears to be stable at this point.    Hypertension; Well controlled.    Anemia of chronic disease:  Monitor H&H.    Hiatal hernia:  Stable. On PPI.     Left ovarian cyst/ mass:  CT showed 4.9 x 3.4 cm. Will probably need an MRI of the pelvis to evaluate for ovarian malignancy.  Pt reports weight loss in the last 1 to 2 months.      DVT prophylaxis: lovenox.  Code Status: full code.  Family Communication: none at bedside.  Disposition Plan: pending clinical improvement.    Consultants:   Surgery.    Procedures: none.   Antimicrobials: IV meropenam Since admission.   Subjective: Reports her abd pain still present, nauseated. No vomiting.   Objective: Vitals:   07/25/18 0618 07/25/18 0807 07/25/18 0904 07/25/18 1208  BP: 101/63 113/81  117/87    Pulse: 76 83  80  Resp: 16 (!) 22  18  Temp: 97.8 F (36.6 C) 98.1 F (36.7 C)  98.5 F (36.9 C)  TempSrc: Oral Oral  Oral  SpO2: 99% 93%  97%  Weight:   50.7 kg   Height:   5' (1.524 m)     Intake/Output Summary (Last 24 hours) at 07/25/2018 1417 Last data filed at 07/25/2018 1249 Gross per 24 hour  Intake 850.92 ml  Output -  Net 850.92 ml   Filed Weights   07/25/18 0904  Weight: 50.7 kg    Examination:  General exam: Appears calm and comfortable  Respiratory system: Clear to auscultation. Respiratory effort normal. Cardiovascular system: S1 & S2 heard, RRR. No JVD,  Gastrointestinal system: Abdomen is soft , mild tender ness in the lower quadrant. Bowel sounds good.  Central nervous system: Alert and oriented. No focal neurological deficits. Extremities: Symmetric 5 x 5 power. Skin: No rashes, lesions or ulcers Psychiatry: Judgement and insight appear normal. Mood & affect appropriate.     Data Reviewed: I have personally reviewed following labs and imaging studies  CBC: Recent Labs  Lab 07/24/18 1656 07/25/18 0347  WBC 17.4* 11.7*  NEUTROABS 14.5*  --   HGB 11.5* 9.9*  HCT 36.1 31.4*  MCV 84.7 86.0  PLT 452* 431   Basic Metabolic Panel: Recent Labs  Lab 07/24/18 1656 07/25/18 0347  NA 137 136  K 4.6 4.5  CL 99 104  CO2  27 25  GLUCOSE 121* 93  BUN 25* 20  CREATININE 1.18* 1.06*  CALCIUM 9.1 8.4*  MG  --  1.8   GFR: Estimated Creatinine Clearance: 25.8 mL/min (A) (by C-G formula based on SCr of 1.06 mg/dL (H)). Liver Function Tests: Recent Labs  Lab 07/24/18 1656  AST 27  ALT 15  ALKPHOS 88  BILITOT 0.4  PROT 8.0  ALBUMIN 3.4*   Recent Labs  Lab 07/24/18 1656  LIPASE 45   No results for input(s): AMMONIA in the last 168 hours. Coagulation Profile: Recent Labs  Lab 07/24/18 2037  INR 1.08   Cardiac Enzymes: No results for input(s): CKTOTAL, CKMB, CKMBINDEX, TROPONINI in the last 168 hours. BNP (last 3 results) No results  for input(s): PROBNP in the last 8760 hours. HbA1C: No results for input(s): HGBA1C in the last 72 hours. CBG: No results for input(s): GLUCAP in the last 168 hours. Lipid Profile: No results for input(s): CHOL, HDL, LDLCALC, TRIG, CHOLHDL, LDLDIRECT in the last 72 hours. Thyroid Function Tests: No results for input(s): TSH, T4TOTAL, FREET4, T3FREE, THYROIDAB in the last 72 hours. Anemia Panel: No results for input(s): VITAMINB12, FOLATE, FERRITIN, TIBC, IRON, RETICCTPCT in the last 72 hours. Sepsis Labs: Recent Labs  Lab 07/24/18 2046 07/25/18 0347  PROCALCITON  --  <0.10  LATICACIDVEN 1.22  --     Recent Results (from the past 240 hour(s))  Culture, blood (x 2)     Status: None (Preliminary result)   Collection Time: 07/24/18  9:28 PM  Result Value Ref Range Status   Specimen Description   Final    BLOOD RIGHT WRIST Performed at [facility] [address], [place], [place] [address]    Special Requests   Final    BOTTLES DRAWN AEROBIC AND ANAEROBIC Blood Culture adequate volume Performed at [facility] [address] [place], [place] [address]    Culture   Final    NO GROWTH < 12 HOURS Performed at [facility] [address] [place], [place] [address]    Report Status PENDING  Incomplete  Culture, blood (x 2)     Status: None (Preliminary result)   Collection Time: 07/24/18  9:28 PM  Result Value Ref Range Status   Specimen Description   Final    BLOOD RIGHT WRIST Performed at [facility] [address], [place], [place] [address]    Special Requests   Final    BOTTLES DRAWN AEROBIC AND ANAEROBIC Blood Culture adequate volume Performed at [facility] [address] [place], [place] [address]    Culture   Final    NO GROWTH < 12 HOURS Performed at [facility] [address] [place], [place] [address]    Report Status PENDING  Incomplete         Radiology  Studies: Ct Abdomen Pelvis W Contrast  Result Date: 07/24/2018 CLINICAL DATA:  Acute mid abdominal pain and tenderness for 1 week with nausea and vomiting EXAM: CT ABDOMEN AND PELVIS WITH CONTRAST TECHNIQUE: Multidetector CT imaging of the abdomen and pelvis was performed using the standard protocol following bolus administration of intravenous contrast. CONTRAST:  79mL OMNIPAQUE IOHEXOL 300 MG/ML  SOLN COMPARISON:  12/28/2014 upper GI series FINDINGS: Lower chest: Minor basilar scarring. Normal heart size. No pericardial pleural effusion. Large hiatal hernia with the majority of the stomach in the chest. Degenerative changes of the spine. Hepatobiliary: 10 mm left hepatic hypodensity,  suspect small cysts. No significant hepatic abnormality. Gallbladder and biliary system unremarkable. Common bile duct nondilated. Pancreas: Unremarkable. No pancreatic ductal dilatation or surrounding inflammatory changes. Spleen: Inferior splenic subcapsular hypodensity, suspect splenic cyst. No splenic abnormality. Adrenals/Urinary Tract: Normal adrenal glands for age. Kidneys demonstrate no acute perinephric edema, obstruction pattern, hydronephrosis, or obstructive uropathy. Ureters are nondilated. Urinary bladder unremarkable. Stomach/Bowel: Negative for bowel obstruction, significant dilatation, ileus, free air. Extensive colonic diverticulosis. In the pelvis , there is extensive diverticular disease, wall thickening, surrounding edema and pericolonic fluid about the sigmoid colon compatible with sigmoid colitis/diverticulitis. Difficult to exclude underlying mural lesion. Small scattered extraluminal entrapped air about the sigmoid diverticulitis compatible with areas of micro perforation. No drainable fluid collection or abscess. Vascular/Lymphatic: Aortoiliac atherosclerosis noted. No aneurysm or occlusive process. No acute vascular finding. Mesenteric and renal vasculature remain patent. No adenopathy. Reproductive:  Uterus is midline mildly prominent size. Right ovary is atrophic. Multi loculated left adnexal ovarian cystic mass measures 4.9 x 3.4 cm, images 51-52. Other: No ascites.  Intact abdominal wall.  No hernia. Musculoskeletal: Degenerative changes of the spine. Bones are osteopenic. Facet arthropathy noted. No acute osseous finding. IMPRESSION: Acute sigmoid colitis/diverticulitis in the left hemipelvis with areas of suspected pericolonic extraluminal air compatible with micro perforations. No significant drainable abscess. No associated obstruction pattern. Large hiatal hernia Abdominal atherosclerosis without aneurysm Complex septated multi-cystic left ovarian 5 cm mass. Recommend nonemergent outpatient GYN referral to assess for indolent cystic ovarian malignancy. Electronically Signed   By: Jerilynn Mages.  Shick M.D.   On: 07/24/2018 19:18        Scheduled Meds: . ALPRAZolam  0.25 mg Oral QHS  . aspirin  81 mg Oral Daily  . enoxaparin (LOVENOX) injection  30 mg Subcutaneous QHS  . gabapentin  100 mg Oral QHS  . irbesartan  150 mg Oral Daily  . levothyroxine  75 mcg Oral QAC breakfast  . olopatadine  1 drop Both Eyes BID  . pantoprazole  40 mg Oral Daily  . sodium chloride flush  3 mL Intravenous Q12H   Continuous Infusions: . sodium chloride 75 mL/hr at 07/25/18 0927  . meropenem (MERREM) IV 200 mL/hr at 07/25/18 0923     LOS: 1 day    Time spent: 25 min.    Hosie Poisson, MD [facility] Pager [phone] If 7PM-7AM, please contact night-coverage [email or web address] Password [email or web address] 07/25/2018, 2:17 PM

## 2018-07-25 NOTE — Progress Notes (Signed)
Nutrition Follow-up  DOCUMENTATION CODES:   Non-severe (moderate) malnutrition in context of acute illness/injury  INTERVENTION:    Monitor for diet advancement/toleration  Boost Plus chocolate BID- Each supplement provides 360kcal and 14g protein.   NUTRITION DIAGNOSIS:   Moderate Malnutrition related to acute illness(sigmoid diverticulitis/colitis ) as evidenced by energy intake < 75% for > or equal to 1 month, moderate muscle depletion, mild muscle depletion, percent weight loss.  GOAL:   Patient will meet greater than or equal to 90% of their needs  MONITOR:   PO intake, Supplement acceptance, Diet advancement, Labs, Weight trends, I & O's  REASON FOR ASSESSMENT:   Malnutrition Screening Tool    ASSESSMENT:   Patient with PMH significant for HTN, HLD, A. fib, AS, hypothyroidism, OA, and rheumatoid arthritis. Presents this admission with complaints of abdominal pain worse last 1 week. Found to have acute sigmoid diverticulitis/colitis.    Pt endorses having a decreased appetite for 3-4 weeks due to worsening abdominal pain upon eating. States she attempted to stick to soft foods like shrimp, yogurt, apple sauce, rice pudding, jello, and mashed potatoes. She would take a couple bites frequently throughout the day because if she "over ate" her abdominal pain became unbearable. Pt is currently NPO at this time. Discussed the importance of protein intake for preservation of lean body mass. Pt is currently NPO but is willing to try Boost once diet is advanced.   Pt reports a UBW of 150 lb, the last time being at that weight one year ago. Records indicate pt weighed 134 lb on 04/30/18 and 111 lb this admission (17.2% wt loss in two months, significant for time frame). Nutrition-Focused physical exam completed.   Medications reviewed and include: NS @ 75 ml/hr Labs reviewed.   NUTRITION - FOCUSED PHYSICAL EXAM:    Most Recent Value  Orbital Region  No depletion  Upper Arm Region   No depletion  Thoracic and Lumbar Region  Unable to assess  Buccal Region  No depletion  Temple Region  Mild depletion  Clavicle Bone Region  Moderate depletion  Clavicle and Acromion Bone Region  Mild depletion  Scapular Bone Region  Unable to assess  Dorsal Hand  Moderate depletion  Patellar Region  Mild depletion  Anterior Thigh Region  Mild depletion  Posterior Calf Region  Mild depletion  Edema (RD Assessment)  None     Diet Order:   Diet Order            Diet NPO time specified Except for: [employer], Sips with Meds  Diet effective now              EDUCATION NEEDS:   Education needs have been addressed  Skin:  Skin Assessment: Reviewed RN Assessment  Last BM:  07/24/18  Height:   Ht Readings from Last 1 Encounters:  07/25/18 5' (1.524 m)    Weight:   Wt Readings from Last 1 Encounters:  07/25/18 50.7 kg    Ideal Body Weight:  45.5 kg  BMI:  Body mass index is 21.83 kg/m.  Estimated Nutritional Needs:   Kcal:  1500-1700 kcal  Protein:  75-90 grams  Fluid:  >/= 1.5 L/day  Mariana Single RD, LDN Clinical Nutrition Pager # [phone]

## 2018-07-26 DIAGNOSIS — E440 Moderate protein-calorie malnutrition: Secondary | ICD-10-CM

## 2018-07-26 LAB — CREATININE, SERUM
Creatinine, Ser: 1.08 mg/dL — ABNORMAL HIGH (ref 0.44–1.00)
GFR calc Af Amer: 51 mL/min — ABNORMAL LOW (ref >60)
GFR calc non Af Amer: 44 mL/min — ABNORMAL LOW (ref >60)

## 2018-07-26 MED ORDER — METRONIDAZOLE IN NACL 5-0.79 MG/ML-% IV SOLN
500.0000 mg | Freq: Three times a day (TID) | INTRAVENOUS | Status: DC
  Administered 2018-07-26 – 2018-07-29 (×8): 500 mg via INTRAVENOUS
  Filled 2018-07-26 (×9): qty 100

## 2018-07-26 MED ORDER — SODIUM CHLORIDE 0.9 % IV SOLN
2.0000 g | INTRAVENOUS | Status: DC
  Administered 2018-07-26 – 2018-07-28 (×3): 2 g via INTRAVENOUS
  Filled 2018-07-26 (×3): qty 2

## 2018-07-26 NOTE — Progress Notes (Signed)
    CC: Abdominal pain  Subjective: Patient says she feels 100% better today.  Still has some tenderness in the left lower quadrant.  Hungry and ready to eat.  Objective: Vital signs in last 24 hours: Temp:  [97.8 F (36.6 C)-98.5 F (36.9 C)] 97.8 F (36.6 C) (09/27 0418) Pulse Rate:  [80-137] 84 (09/27 0418) Resp:  [18] 18 (09/27 0418) BP: (117-146)/(73-93) 126/73 (09/27 0418) SpO2:  [97 %-99 %] 98 % (09/27 0418) Weight:  [50.7 kg] 50.7 kg (09/26 0904) Last BM Date: 07/24/18 NPO 965 IV Urine x 3 No BM recorded Afebrile, VSS Creatinine is stable, no CBC CT scan 9/25: Acute sigmoid colitis/diverticulitis in the left hemipelvis with areas of suspected pericolonic extraluminal air compatible with microperforation.  No drainable abscess no obstruction, large hiatal hernia.   Intake/Output from previous day: 09/26 0701 - 09/27 0700 In: 965.3 [I.V.:865.3; IV Piggyback:100] Out: -  Intake/Output this shift: No intake/output data recorded.  General appearance: alert, cooperative and no distress GI: Soft, feels much better, positive bowel sounds still tender in the left lower quadrant on palpation.  Lab Results:  Recent Labs    07/24/18 1656 07/25/18 0347  WBC 17.4* 11.7*  HGB 11.5* 9.9*  HCT 36.1 31.4*  PLT 452* 390    BMET Recent Labs    07/24/18 1656 07/25/18 0347 07/26/18 0419  NA 137 136  --   K 4.6 4.5  --   CL 99 104  --   CO2 27 25  --   GLUCOSE 121* 93  --   BUN 25* 20  --   CREATININE 1.18* 1.06* 1.08*  CALCIUM 9.1 8.4*  --    PT/INR Recent Labs    07/24/18 2037  LABPROT 13.9  INR 1.08    Recent Labs  Lab 07/24/18 1656  AST 27  ALT 15  ALKPHOS 88  BILITOT 0.4  PROT 8.0  ALBUMIN 3.4*     Lipase     Component Value Date/Time   LIPASE 45 07/24/2018 1656     Medications: . ALPRAZolam  0.25 mg Oral QHS  . aspirin  81 mg Oral Daily  . enoxaparin (LOVENOX) injection  30 mg Subcutaneous QHS  . gabapentin  100 mg Oral QHS  .  irbesartan  150 mg Oral Daily  . levothyroxine  75 mcg Oral QAC breakfast  . olopatadine  1 drop Both Eyes BID  . pantoprazole  40 mg Oral Daily  . sodium chloride flush  3 mL Intravenous Q12H    Assessment/Plan Hypertension A fib - not on anticoagulation Mild CKD AS Hypothyroid OA/RA Hx of IBS Neuropathic pain Hyperlipidemia Non-severe (moderate) malnutrition in context of acute illness  Perforated Sigmoid Diverticulitis  FEN: IV fluids/NPO ID:  Meropenem 9/25 =>> day 3 DVT:  lovenox   Plan: Continue IV fluids, antibiotics, start clears with p.o. supplements per dietitian.  LOS: 2 days    Deryck Hippler 07/26/2018 [phone]

## 2018-07-26 NOTE — Progress Notes (Signed)
PROGRESS NOTE    [name]  XIH:[id number] DOB: [birth year] DOA: 07/24/2018 PCP: [name], NP    Brief Narrative:   [name] is a 84 y.o. female with medical history significant of HTN, HLD, A. fib not on anticoagulation, AS, hypothyroidism, OA, and rheumatoid arthritis; who presents with complaints of abdominal pain worse last 1 week. CT scan showed inflammation of the sigmoid colon with signs of microperforation. She was admitted for acute diverticulitis and surgery consulted for recommendations.    Assessment & Plan:   Active Problems:   Hypothyroidism   Atrial fibrillation, chronic (HCC)   Essential hypertension   Normocytic normochromic anemia   Sepsis ([place])   Sigmoid diverticulitis   Thrombocytosis (HCC)   Malnutrition of moderate degree   Acute sigmoid diverticulitis:  Admitted for IV antibiotics, IV fluids and IV anti emetics.  Bowel rest with NPO, her nausea and pain improved, she was transitioned to clear liquid diet.  Surgery consulted and recommendations given.  Pain control. Changed abx to rocephin and flagyl.    Chronic atrial fibrillation;  Rate controlled.     AKI;  Suspect from dehydration.  Continue with IV fluids. Her creatinine improved with IV fluids. Creatinine staying stable.   Recheck bmp in am.    RA: Appears to be stable at this point.    Hypertension; Well controlled. No chagne in meds.    Anemia of chronic disease:  Monitor H&H. Baseline hemoglobin around 11. Currently at 9.9. Repeat cbc in am.    Hiatal hernia:  Stable. On PPI.     Left ovarian cyst/ mass:  CT showed 4.9 x 3.4 cm. Will probably need an MRI of the pelvis to evaluate for ovarian malignancy. Pt refused MRI at this time, and would like to follow up with gyn on discharge.  Pt reports weight loss in the last 1 to 2 months.      DVT prophylaxis: lovenox.  Code Status: full code.  Family Communication: none at bedside.    Disposition Plan: pending clinical improvement.    Consultants:   Surgery.    Procedures: none.   Antimicrobials: IV meropenam Since admission.   Subjective: No vomiting, nausea, improved but not resolved.  abd pain improved but not resolved.   Objective: Vitals:   07/25/18 2208 07/25/18 2300 07/26/18 0418 07/26/18 1354  BP: (!) 146/93 134/76 126/73 (!) 145/84  Pulse: (!) 137 83 84 (!) 119  Resp: 18  18 18   Temp: 98.4 F (36.9 C)  97.8 F (36.6 C) 98.6 F (37 C)  TempSrc: Oral  Oral Oral  SpO2: 99%  98% 99%  Weight:      Height:        Intake/Output Summary (Last 24 hours) at 07/26/2018 1509 Last data filed at 07/26/2018 1355 Gross per 24 hour  Intake 363 ml  Output -  Net 363 ml   Filed Weights   07/25/18 0904  Weight: 50.7 kg    Examination:  General exam: not in distress,  Respiratory system: Clear to auscultation. Respiratory effort normal. No wheezing or rhonchi.  Cardiovascular system: S1 & S2 heard, RRR. No JVD,  Gastrointestinal system: Abdomen is soft , mild tender ness in the left lower quadrant Bowel sounds good.  Central nervous system: Alert and oriented. Non focal.  Extremities: Symmetric 5 x 5 power. Skin: No rashes, lesions or ulcers Psychiatry: Mood & affect appropriate.     Data Reviewed: I have personally reviewed following labs and imaging studies  CBC: Recent Labs  Lab 07/24/18 1656 07/25/18 0347  WBC 17.4* 11.7*  NEUTROABS 14.5*  --   HGB 11.5* 9.9*  HCT 36.1 31.4*  MCV 84.7 86.0  PLT 452* 734   Basic Metabolic Panel: Recent Labs  Lab 07/24/18 1656 07/25/18 0347 07/26/18 0419  NA 137 136  --   K 4.6 4.5  --   CL 99 104  --   CO2 27 25  --   GLUCOSE 121* 93  --   BUN 25* 20  --   CREATININE 1.18* 1.06* 1.08*  CALCIUM 9.1 8.4*  --   MG  --  1.8  --    GFR: Estimated Creatinine Clearance: 25.4 mL/min (A) (by C-G formula based on SCr of 1.08 mg/dL (H)). Liver Function Tests: Recent Labs  Lab 07/24/18 1656   AST 27  ALT 15  ALKPHOS 88  BILITOT 0.4  PROT 8.0  ALBUMIN 3.4*   Recent Labs  Lab 07/24/18 1656  LIPASE 45   No results for input(s): AMMONIA in the last 168 hours. Coagulation Profile: Recent Labs  Lab 07/24/18 2037  INR 1.08   Cardiac Enzymes: No results for input(s): CKTOTAL, CKMB, CKMBINDEX, TROPONINI in the last 168 hours. BNP (last 3 results) No results for input(s): PROBNP in the last 8760 hours. HbA1C: No results for input(s): HGBA1C in the last 72 hours. CBG: No results for input(s): GLUCAP in the last 168 hours. Lipid Profile: No results for input(s): CHOL, HDL, LDLCALC, TRIG, CHOLHDL, LDLDIRECT in the last 72 hours. Thyroid Function Tests: No results for input(s): TSH, T4TOTAL, FREET4, T3FREE, THYROIDAB in the last 72 hours. Anemia Panel: No results for input(s): VITAMINB12, FOLATE, FERRITIN, TIBC, IRON, RETICCTPCT in the last 72 hours. Sepsis Labs: Recent Labs  Lab 07/24/18 2046 07/25/18 0347  PROCALCITON  --  <0.10  LATICACIDVEN 1.22  --     Recent Results (from the past 240 hour(s))  Culture, blood (x 2)     Status: None (Preliminary result)   Collection Time: 07/24/18  9:28 PM  Result Value Ref Range Status   Specimen Description   Final    BLOOD RIGHT WRIST Performed at [facility] [address], [place], [place] [address]    Special Requests   Final    BOTTLES DRAWN AEROBIC AND ANAEROBIC Blood Culture adequate volume Performed at [facility] [address], [place], [place] [address]    Culture   Final    NO GROWTH 1 DAY Performed at [facility], [place] [address], [place], [place] [address]    Report Status PENDING  Incomplete  Culture, blood (x 2)     Status: None (Preliminary result)   Collection Time: 07/24/18  9:28 PM  Result Value Ref Range Status   Specimen Description   Final    BLOOD RIGHT WRIST Performed at [facility] [address], [place], [place]  [address]    Special Requests   Final    BOTTLES DRAWN AEROBIC AND ANAEROBIC Blood Culture adequate volume Performed at [facility] [address], [place], [place] [address]    Culture   Final    NO GROWTH 1 DAY Performed at [facility], [place] [address], [place],  [address]    Report Status PENDING  Incomplete         Radiology Studies: Ct Abdomen Pelvis W Contrast  Result Date: 07/24/2018 CLINICAL DATA:  Acute mid abdominal pain and  tenderness for 1 week with nausea and vomiting EXAM: CT ABDOMEN AND PELVIS WITH CONTRAST TECHNIQUE: Multidetector CT imaging of the abdomen and pelvis was performed using the standard protocol following bolus administration of intravenous contrast. CONTRAST:  68mL OMNIPAQUE IOHEXOL 300 MG/ML  SOLN COMPARISON:  12/28/2014 upper GI series FINDINGS: Lower chest: Minor basilar scarring. Normal heart size. No pericardial pleural effusion. Large hiatal hernia with the majority of the stomach in the chest. Degenerative changes of the spine. Hepatobiliary: 10 mm left hepatic hypodensity, suspect small cysts. No significant hepatic abnormality. Gallbladder and biliary system unremarkable. Common bile duct nondilated. Pancreas: Unremarkable. No pancreatic ductal dilatation or surrounding inflammatory changes. Spleen: Inferior splenic subcapsular hypodensity, suspect splenic cyst. No splenic abnormality. Adrenals/Urinary Tract: Normal adrenal glands for age. Kidneys demonstrate no acute perinephric edema, obstruction pattern, hydronephrosis, or obstructive uropathy. Ureters are nondilated. Urinary bladder unremarkable. Stomach/Bowel: Negative for bowel obstruction, significant dilatation, ileus, free air. Extensive colonic diverticulosis. In the pelvis , there is extensive diverticular disease, wall thickening, surrounding edema and pericolonic fluid about the sigmoid colon compatible with sigmoid colitis/diverticulitis. Difficult to exclude  underlying mural lesion. Small scattered extraluminal entrapped air about the sigmoid diverticulitis compatible with areas of micro perforation. No drainable fluid collection or abscess. Vascular/Lymphatic: Aortoiliac atherosclerosis noted. No aneurysm or occlusive process. No acute vascular finding. Mesenteric and renal vasculature remain patent. No adenopathy. Reproductive: Uterus is midline mildly prominent size. Right ovary is atrophic. Multi loculated left adnexal ovarian cystic mass measures 4.9 x 3.4 cm, images 51-52. Other: No ascites.  Intact abdominal wall.  No hernia. Musculoskeletal: Degenerative changes of the spine. Bones are osteopenic. Facet arthropathy noted. No acute osseous finding. IMPRESSION: Acute sigmoid colitis/diverticulitis in the left hemipelvis with areas of suspected pericolonic extraluminal air compatible with micro perforations. No significant drainable abscess. No associated obstruction pattern. Large hiatal hernia Abdominal atherosclerosis without aneurysm Complex septated multi-cystic left ovarian 5 cm mass. Recommend nonemergent outpatient GYN referral to assess for indolent cystic ovarian malignancy. Electronically Signed   By: Jerilynn Mages.  Shick M.D.   On: 07/24/2018 19:18        Scheduled Meds: . ALPRAZolam  0.25 mg Oral QHS  . aspirin  81 mg Oral Daily  . enoxaparin (LOVENOX) injection  30 mg Subcutaneous QHS  . gabapentin  100 mg Oral QHS  . irbesartan  150 mg Oral Daily  . levothyroxine  75 mcg Oral QAC breakfast  . olopatadine  1 drop Both Eyes BID  . pantoprazole  40 mg Oral Daily  . sodium chloride flush  3 mL Intravenous Q12H   Continuous Infusions: . sodium chloride 75 mL/hr at 07/26/18 0857  . [START ON 07/27/2018] cefTRIAXone (ROCEPHIN)  IV    . metronidazole       LOS: 2 days    Time spent: 25 min.    Hosie Poisson, MD [facility] Pager [phone] If 7PM-7AM, please contact night-coverage [email or web address] Password [facility] 07/26/2018, 3:09 PM

## 2018-07-26 NOTE — Progress Notes (Signed)
Pt has had an uneventful day without n/v or abdominal pain. She has tolerated po clear liquids without problems. States she "does not want surgery at her age", but will think about having biopsy of ovarian cyst/mass done before she is discharged overnight. Eulas Post, RN

## 2018-07-27 LAB — CBC
HCT: 32.2 % — ABNORMAL LOW (ref 36.0–46.0)
Hemoglobin: 10.2 g/dL — ABNORMAL LOW (ref 12.0–15.0)
MCH: 27 pg (ref 26.0–34.0)
MCHC: 31.7 g/dL (ref 30.0–36.0)
MCV: 85.2 fL (ref 78.0–100.0)
Platelets: 396 10*3/uL (ref 150–400)
RBC: 3.78 MIL/uL — ABNORMAL LOW (ref 3.87–5.11)
RDW: 15.4 % (ref 11.5–15.5)
WBC: 8.1 10*3/uL (ref 4.0–10.5)

## 2018-07-27 LAB — BASIC METABOLIC PANEL
Anion gap: 7 (ref 5–15)
BUN: 13 mg/dL (ref 8–23)
CO2: 25 mmol/L (ref 22–32)
Calcium: 8.3 mg/dL — ABNORMAL LOW (ref 8.9–10.3)
Chloride: 106 mmol/L (ref 98–111)
Creatinine, Ser: 0.99 mg/dL (ref 0.44–1.00)
GFR calc Af Amer: 57 mL/min — ABNORMAL LOW (ref >60)
GFR calc non Af Amer: 49 mL/min — ABNORMAL LOW (ref >60)
Glucose, Bld: 93 mg/dL (ref 70–99)
Potassium: 4.3 mmol/L (ref 3.5–5.1)
Sodium: 138 mmol/L (ref 135–145)

## 2018-07-27 MED ORDER — MORPHINE SULFATE (PF) 2 MG/ML IV SOLN
1.0000 mg | INTRAVENOUS | Status: DC | PRN
  Administered 2018-07-27 – 2018-07-29 (×6): 2 mg via INTRAVENOUS
  Filled 2018-07-27 (×6): qty 1

## 2018-07-27 NOTE — Progress Notes (Signed)
    CC: Abdominal pain  Subjective: Continues to improve.    Objective: Vital signs in last 24 hours: Temp:  [98 F (36.7 C)-99.6 F (37.6 C)] 98 F (36.7 C) (09/28 0530) Pulse Rate:  [78-119] 78 (09/28 0530) Resp:  [18] 18 (09/27 1354) BP: (129-145)/(79-90) 129/79 (09/28 0530) SpO2:  [99 %-100 %] 100 % (09/28 0530) Last BM Date: 07/24/18   Intake/Output from previous day: 09/27 0701 - 09/28 0700 In: 1463 [P.O.:360; I.V.:903; IV Piggyback:200] Out: -  Intake/Output this shift: Total I/O In: 120 [P.O.:120] Out: -   General appearance: alert, cooperative and no distress GI: Soft, feels much better, minimal LLQ tenderness.  Lab Results:  Recent Labs    07/25/18 0347 07/27/18 0420  WBC 11.7* 8.1  HGB 9.9* 10.2*  HCT 31.4* 32.2*  PLT 390 396    BMET Recent Labs    07/25/18 0347 07/26/18 0419 07/27/18 0420  NA 136  --  138  K 4.5  --  4.3  CL 104  --  106  CO2 25  --  25  GLUCOSE 93  --  93  BUN 20  --  13  CREATININE 1.06* 1.08* 0.99  CALCIUM 8.4*  --  8.3*   PT/INR Recent Labs    07/24/18 2037  LABPROT 13.9  INR 1.08    Recent Labs  Lab 07/24/18 1656  AST 27  ALT 15  ALKPHOS 88  BILITOT 0.4  PROT 8.0  ALBUMIN 3.4*     Lipase     Component Value Date/Time   LIPASE 45 07/24/2018 1656     Medications: . ALPRAZolam  0.25 mg Oral QHS  . aspirin  81 mg Oral Daily  . enoxaparin (LOVENOX) injection  30 mg Subcutaneous QHS  . gabapentin  100 mg Oral QHS  . irbesartan  150 mg Oral Daily  . levothyroxine  75 mcg Oral QAC breakfast  . olopatadine  1 drop Both Eyes BID  . pantoprazole  40 mg Oral Daily  . sodium chloride flush  3 mL Intravenous Q12H    Assessment/Plan Hypertension A fib - not on anticoagulation Mild CKD AS Hypothyroid OA/RA Hx of IBS Neuropathic pain Hyperlipidemia Non-severe (moderate) malnutrition in context of acute illness  Perforated Sigmoid Diverticulitis  FEN: IV fluids/NPO ID:  Meropenem 9/25 =>>  day 3 DVT:  lovenox  Plan: antibiotics Full liquids OK today. If continues to improve ok to do low residue diet in AM tomorrow.      LOS: 3 days    Stark Klein 07/27/2018

## 2018-07-27 NOTE — Plan of Care (Signed)
Patient changed to full liquids, tolerated well from a nausea standpoint.  Patient did have a BM this afternoon and then developed sharp crampy abdominal pain unrelieved by Fentanyl.  MD notified, new orders received.  Son at bedside for part of shift.

## 2018-07-27 NOTE — Progress Notes (Signed)
PROGRESS NOTE    [name]  DZH:[id number] DOB: 02/16/1927 DOA: 07/24/2018 PCP: [name], NP    Brief Narrative:   [name] is a 84 y.o. female with medical history significant of HTN, HLD, A. fib not on anticoagulation, AS, hypothyroidism, OA, and rheumatoid arthritis; who presents with complaints of abdominal pain worse last 1 week. CT scan showed inflammation of the sigmoid colon with signs of microperforation. She was admitted for acute diverticulitis and surgery consulted for recommendations.    Assessment & Plan:   Active Problems:   Hypothyroidism   Atrial fibrillation, chronic (HCC)   Essential hypertension   Normocytic normochromic anemia   Sepsis ([place])   Sigmoid diverticulitis   Thrombocytosis (HCC)   Malnutrition of moderate degree   Acute sigmoid diverticulitis:  Admitted for IV antibiotics, IV fluids and IV anti emetics.  Bowel rest with NPO, her nausea and pain improved, she was transitioned to clear liquid diet. As she tolerated clears , we transitioned to full liquids, she was able to take full liquid initially, but later this evening her abd pain returned after a BM .  Changed her diet to clears for tonight.  Surgery consulted and recommendations given.  Pain control. Changed abx to rocephin and flagyl.  Afebrile and wbc count wnl.    Chronic atrial fibrillation;  Rate controlled.     AKI;  Suspect from dehydration.  Continue with IV fluids. Her creatinine improved with IV fluids. Creatinine staying stable.      RA: Appears to be stable at this point.    Hypertension; Well controlled. No chagne in meds.    Anemia of chronic disease:  Monitor H&H. Baseline hemoglobin around 11.  At 10.2 today.    Hiatal hernia:  Stable. On PPI.     Left ovarian cyst/ mass:  CT showed 4.9 x 3.4 cm. Will probably need an MRI of the pelvis to evaluate for ovarian malignancy. Pt refused MRI at this time, and would like to follow  up with gyn on discharge.  Pt reports weight loss in the last 1 to 2 months.      DVT prophylaxis: lovenox.  Code Status: full code.  Family Communication: none at bedside.  Disposition Plan: pending clinical improvement.    Consultants:   Surgery.    Procedures: none.   Antimicrobials: IV meropenam Since admission.   Subjective: Sever abd pain after eating full liq diet. 8/10.  No chest pain, no nausea. Or vomiting.   Objective: Vitals:   07/26/18 1354 07/26/18 2034 07/27/18 0530 07/27/18 1350  BP: (!) 145/84 138/90 129/79 108/82  Pulse: (!) 119 99 78 (!) 115  Resp: 18   16  Temp: 98.6 F (37 C) 99.6 F (37.6 C) 98 F (36.7 C) 98.9 F (37.2 C)  TempSrc: Oral Oral Oral Oral  SpO2: 99% 99% 100% 98%  Weight:      Height:        Intake/Output Summary (Last 24 hours) at 07/27/2018 1802 Last data filed at 07/27/2018 1700 Gross per 24 hour  Intake 723 ml  Output -  Net 723 ml   Filed Weights   07/25/18 0904  Weight: 50.7 kg    Examination:  General exam: not in distress,  Respiratory system: air entry fair no wheezing or rhonchi.  Cardiovascular system: S1 & S2 heard, RRR. No JVD,  Gastrointestinal system: adb is soft, tender in LLQ. Bowel sounds good.  Central nervous system: Alert and oriented. Non focal.  Extremities: Symmetric 5 x 5 power. Skin: No rashes, lesions or ulcers Psychiatry: Mood & affect appropriate.     Data Reviewed: I have personally reviewed following labs and imaging studies  CBC: Recent Labs  Lab 07/24/18 1656 07/25/18 0347 07/27/18 0420  WBC 17.4* 11.7* 8.1  NEUTROABS 14.5*  --   --   HGB 11.5* 9.9* 10.2*  HCT 36.1 31.4* 32.2*  MCV 84.7 86.0 85.2  PLT 452* 390 762   Basic Metabolic Panel: Recent Labs  Lab 07/24/18 1656 07/25/18 0347 07/26/18 0419 07/27/18 0420  NA 137 136  --  138  K 4.6 4.5  --  4.3  CL 99 104  --  106  CO2 27 25  --  25  GLUCOSE 121* 93  --  93  BUN 25* 20  --  13  CREATININE 1.18* 1.06*  1.08* 0.99  CALCIUM 9.1 8.4*  --  8.3*  MG  --  1.8  --   --    GFR: Estimated Creatinine Clearance: 27.7 mL/min (by C-G formula based on SCr of 0.99 mg/dL). Liver Function Tests: Recent Labs  Lab 07/24/18 1656  AST 27  ALT 15  ALKPHOS 88  BILITOT 0.4  PROT 8.0  ALBUMIN 3.4*   Recent Labs  Lab 07/24/18 1656  LIPASE 45   No results for input(s): AMMONIA in the last 168 hours. Coagulation Profile: Recent Labs  Lab 07/24/18 2037  INR 1.08   Cardiac Enzymes: No results for input(s): CKTOTAL, CKMB, CKMBINDEX, TROPONINI in the last 168 hours. BNP (last 3 results) No results for input(s): PROBNP in the last 8760 hours. HbA1C: No results for input(s): HGBA1C in the last 72 hours. CBG: No results for input(s): GLUCAP in the last 168 hours. Lipid Profile: No results for input(s): CHOL, HDL, LDLCALC, TRIG, CHOLHDL, LDLDIRECT in the last 72 hours. Thyroid Function Tests: No results for input(s): TSH, T4TOTAL, FREET4, T3FREE, THYROIDAB in the last 72 hours. Anemia Panel: No results for input(s): VITAMINB12, FOLATE, FERRITIN, TIBC, IRON, RETICCTPCT in the last 72 hours. Sepsis Labs: Recent Labs  Lab 07/24/18 2046 07/25/18 0347  PROCALCITON  --  <0.10  LATICACIDVEN 1.22  --     Recent Results (from the past 240 hour(s))  Culture, blood (x 2)     Status: None (Preliminary result)   Collection Time: 07/24/18  9:28 PM  Result Value Ref Range Status   Specimen Description   Final    BLOOD RIGHT WRIST Performed at [facility] [address], [place], [place] [address]    Special Requests   Final    BOTTLES DRAWN AEROBIC AND ANAEROBIC Blood Culture adequate volume Performed at [facility] [address] [place], [place] [address]    Culture   Final    NO GROWTH 2 DAYS Performed at [facility] [address] [place], [place] [address]    Report Status PENDING  Incomplete  Culture, blood (x 2)     Status: None  (Preliminary result)   Collection Time: 07/24/18  9:28 PM  Result Value Ref Range Status   Specimen Description   Final    BLOOD RIGHT WRIST Performed at [facility] [address], [place], [place] [address]    Special Requests   Final    BOTTLES DRAWN AEROBIC AND ANAEROBIC Blood Culture adequate volume Performed at [facility] [address], [place],  [address]    Culture   Final  NO GROWTH 2 DAYS Performed at [facility], [place] [address] [place], [place] [address]    Report Status PENDING  Incomplete         Radiology Studies: No results found.      Scheduled Meds: . ALPRAZolam  0.25 mg Oral QHS  . aspirin  81 mg Oral Daily  . enoxaparin (LOVENOX) injection  30 mg Subcutaneous QHS  . gabapentin  100 mg Oral QHS  . irbesartan  150 mg Oral Daily  . levothyroxine  75 mcg Oral QAC breakfast  . olopatadine  1 drop Both Eyes BID  . pantoprazole  40 mg Oral Daily  . sodium chloride flush  3 mL Intravenous Q12H   Continuous Infusions: . sodium chloride 75 mL/hr at 07/27/18 1654  . cefTRIAXone (ROCEPHIN)  IV 2 g (07/26/18 2248)  . metronidazole 500 mg (07/27/18 1416)     LOS: 3 days    Time spent: 25 min.    Hosie Poisson, MD [facility] Pager [phone] If 7PM-7AM, please contact night-coverage [email or web address] Password [facility] 07/27/2018, 6:02 PM

## 2018-07-28 NOTE — Plan of Care (Signed)
Patient tolerating full liquids, had one BM earlier in shift (prior to diet being advanced) which caused lower abdominal pain requiring MS and Fentanyl to relieve.  Multiple family members at bedside this shift.

## 2018-07-28 NOTE — Progress Notes (Signed)
PROGRESS NOTE    [name]  KGY:[id number] DOB: 01/23/1927 DOA: 07/24/2018 PCP: [name], NP    Brief Narrative:   [name] is a 84 y.o. female with medical history significant of HTN, HLD, A. fib not on anticoagulation, AS, hypothyroidism, OA, and rheumatoid arthritis; who presents with complaints of abdominal pain worse last 1 week. CT scan showed inflammation of the sigmoid colon with signs of microperforation. She was admitted for acute diverticulitis and surgery consulted for recommendations.    Assessment & Plan:   Principal Problem:   Perforation of sigmoid colon due to diverticulitis Active Problems:   Hypothyroidism   Atrial fibrillation, chronic (HCC)   Essential hypertension   Rheumatoid arthritis (HCC)   CKD (chronic kidney disease) stage 2, GFR 60-89 ml/min   Normocytic normochromic anemia   Sepsis (HCC)   Thrombocytosis (HCC)   Malnutrition of moderate degree   Acute sigmoid diverticulitis:  Admitted for IV antibiotics, IV fluids and IV anti emetics.  Bowel rest with NPO, her nausea and pain improved, she was transitioned to clear liquid diet. As she tolerated clears , we transitioned to full liquids, she was able to take full liquid initially, but later the  evening her abd pain returned after a BM .  Remain on full liquid diet and monitor.  Surgery consulted and recommendations given.  Pain control. Changed abx to rocephin and flagyl.    Fever:  Get CBC in am.  Get blood cultures. If her pain worsens, will discuss with surgery to see if she needs repeat CT abdomen.  Currently her abdominal pain hasn't worsened since yesterday.    Chronic atrial fibrillation;  Rate controlled.     AKI;  Suspect from dehydration.  Continue with IV fluids. Her creatinine improved with IV fluids. Creatinine staying stable.      RA: Appears to be stable at this point.    Hypertension; Well controlled..    Anemia of chronic disease:   Monitor H&H. Baseline hemoglobin around 11.  At 10.2 .   Hiatal hernia:  Stable. On PPI.     Left ovarian cyst/ mass:  CT showed 4.9 x 3.4 cm. Will probably need an MRI of the pelvis to evaluate for ovarian malignancy. Pt refused MRI at this time, and would like to follow up with gyn on discharge.  Pt reports weight loss in the last 1 to 2 months.      DVT prophylaxis: lovenox.  Code Status: full code.  Family Communication: none at bedside.  Disposition Plan: pending clinical improvement.    Consultants:   Surgery.    Procedures: none.   Antimicrobials: IV meropenam to IV rocephin and flagyl.   Subjective:  abd pain has improved but not resolved. Some nausea, no vomiting.   Objective: Vitals:   07/27/18 2107 07/27/18 2314 07/28/18 0534 07/28/18 1343  BP: (!) 144/104  117/75 (!) 149/91  Pulse: (!) 123  (!) 102 (!) 119  Resp: (!) 24  18 (!) 24  Temp: (!) 102.3 F (39.1 C) 100 F (37.8 C) 98.5 F (36.9 C) 99.3 F (37.4 C)  TempSrc: Oral Oral Oral Oral  SpO2: 96%  99% 98%  Weight:      Height:        Intake/Output Summary (Last 24 hours) at 07/28/2018 1510 Last data filed at 07/28/2018 1031 Gross per 24 hour  Intake 1320 ml  Output 1 ml  Net 1319 ml   Filed Weights   07/25/18 0904  Weight: 50.7 kg    Examination:  General exam: alert and not in distress.  Respiratory system: bilateral air entry fair, no wheezing or rhonchi.  Cardiovascular system: S1 & S2 heard, RRR. No JVD,  Gastrointestinal system: adb is soft, tender in LLQ the same. . Bowel sounds good.  Central nervous system: Alert and oriented. Non focal.  Extremities: Symmetric 5 x 5 power. Skin: No rashes, lesions or ulcers Psychiatry: Mood & affect appropriate.     Data Reviewed: I have personally reviewed following labs and imaging studies  CBC: Recent Labs  Lab 07/24/18 1656 07/25/18 0347 07/27/18 0420  WBC 17.4* 11.7* 8.1  NEUTROABS 14.5*  --   --   HGB 11.5* 9.9* 10.2*    HCT 36.1 31.4* 32.2*  MCV 84.7 86.0 85.2  PLT 452* 390 517   Basic Metabolic Panel: Recent Labs  Lab 07/24/18 1656 07/25/18 0347 07/26/18 0419 07/27/18 0420  NA 137 136  --  138  K 4.6 4.5  --  4.3  CL 99 104  --  106  CO2 27 25  --  25  GLUCOSE 121* 93  --  93  BUN 25* 20  --  13  CREATININE 1.18* 1.06* 1.08* 0.99  CALCIUM 9.1 8.4*  --  8.3*  MG  --  1.8  --   --    GFR: Estimated Creatinine Clearance: 27.7 mL/min (by C-G formula based on SCr of 0.99 mg/dL). Liver Function Tests: Recent Labs  Lab 07/24/18 1656  AST 27  ALT 15  ALKPHOS 88  BILITOT 0.4  PROT 8.0  ALBUMIN 3.4*   Recent Labs  Lab 07/24/18 1656  LIPASE 45   No results for input(s): AMMONIA in the last 168 hours. Coagulation Profile: Recent Labs  Lab 07/24/18 2037  INR 1.08   Cardiac Enzymes: No results for input(s): CKTOTAL, CKMB, CKMBINDEX, TROPONINI in the last 168 hours. BNP (last 3 results) No results for input(s): PROBNP in the last 8760 hours. HbA1C: No results for input(s): HGBA1C in the last 72 hours. CBG: No results for input(s): GLUCAP in the last 168 hours. Lipid Profile: No results for input(s): CHOL, HDL, LDLCALC, TRIG, CHOLHDL, LDLDIRECT in the last 72 hours. Thyroid Function Tests: No results for input(s): TSH, T4TOTAL, FREET4, T3FREE, THYROIDAB in the last 72 hours. Anemia Panel: No results for input(s): VITAMINB12, FOLATE, FERRITIN, TIBC, IRON, RETICCTPCT in the last 72 hours. Sepsis Labs: Recent Labs  Lab 07/24/18 2046 07/25/18 0347  PROCALCITON  --  <0.10  LATICACIDVEN 1.22  --     Recent Results (from the past 240 hour(s))  Culture, blood (x 2)     Status: None (Preliminary result)   Collection Time: 07/24/18  9:28 PM  Result Value Ref Range Status   Specimen Description   Final    BLOOD RIGHT WRIST Performed at [facility] [address] [place], [place] [address]    Special Requests   Final    BOTTLES DRAWN AEROBIC AND ANAEROBIC  Blood Culture adequate volume Performed at [facility] [address], [place], [place] [address]    Culture   Final    NO GROWTH 3 DAYS Performed at [facility], [place] [address], [place], [place] [address]    Report Status PENDING  Incomplete  Culture, blood (x 2)     Status: None (Preliminary result)   Collection Time: 07/24/18  9:28 PM  Result Value Ref Range Status   Specimen Description  Final    BLOOD RIGHT WRIST Performed at [facility] [address], [place], [place] [address]    Special Requests   Final    BOTTLES DRAWN AEROBIC AND ANAEROBIC Blood Culture adequate volume Performed at [facility] [address], [place], [place] [address]    Culture   Final    NO GROWTH 3 DAYS Performed at [facility], [place] [address], [place], [place] [address]    Report Status PENDING  Incomplete         Radiology Studies: No results found.      Scheduled Meds: . ALPRAZolam  0.25 mg Oral QHS  . aspirin  81 mg Oral Daily  . enoxaparin (LOVENOX) injection  30 mg Subcutaneous QHS  . gabapentin  100 mg Oral QHS  . irbesartan  150 mg Oral Daily  . levothyroxine  75 mcg Oral QAC breakfast  . olopatadine  1 drop Both Eyes BID  . pantoprazole  40 mg Oral Daily  . sodium chloride flush  3 mL Intravenous Q12H   Continuous Infusions: . sodium chloride 75 mL/hr at 07/27/18 1654  . cefTRIAXone (ROCEPHIN)  IV 2 g (07/27/18 2013)  . metronidazole 500 mg (07/28/18 1446)     LOS: 4 days    Time spent: 25 min.    Hosie Poisson, MD [facility] Pager [phone] If 7PM-7AM, please contact night-coverage [email or web address] Password [email or web address] 07/28/2018, 3:10 PM

## 2018-07-28 NOTE — Progress Notes (Addendum)
Progress Note    [name] [id number] Feb 11, 1927  CARE TEAM:  PCP: [name], NP  Outpatient Care Team: Patient Care Team: Nche, Charlene Brooke, NP as PCP - General (Internal Medicine) Nahser, Wonda Cheng, MD (Cardiology) Gavin Pound, MD as Consulting Physician (Rheumatology) Tommy Medal, Lavell Islam, MD as Consulting Physician (Infectious Diseases) Bo Merino, MD as Consulting Physician (Rheumatology)  Inpatient Treatment Team: Treatment Team: Attending Provider: Hosie Poisson, MD; Rounding Team: Threasa Beards, MD; Consulting Physician: Edison Pace, Md, MD; Registered Nurse: Basilio Cairo, RN; Technician: Lucila Maine, NT; Student Nurse: Mirna Mires; Registered Nurse: Franki Monte, RN; Technician: Heber Nathalie, NT; Technician: Renea Ee, NT; Technician: Nicholes Stairs, NT; Registered Nurse: Nilda Calamity, RN   Problem List:   Principal Problem:   Perforation of sigmoid colon due to diverticulitis Active Problems:   Hypothyroidism   Atrial fibrillation, chronic ([place])   Essential hypertension   Rheumatoid arthritis ([place])   CKD (chronic kidney disease) stage 2, GFR 60-89 ml/min   Normocytic normochromic anemia   Sepsis ([place])   Thrombocytosis (HCC)   Malnutrition of moderate degree      * No surgery found [facility] Stay = 4 days   Assessment/Plan:    Hypertension A fib - not on anticoagulation Mild CKD AS Hypothyroid OA/RA Hx of IBS Neuropathic pain Hyperlipidemia Non-severe (moderate) malnutrition in context of acute illness  Perforated Sigmoid Diverticulitis  FEN: IV fluids/NPO ID: Meropenem 9/25 > 9/27        Rocephin 9/27 >>        Flagyl 9/27 >>  Plan:  - Continue Abx as she is improving.  - Continue pain control - Morphine IM prn - Advance diet from full liquids to soft/low residue diet and see how she tolerates.   Family Communication/Anticipated D/C date and plan/Code  Status   DVT prophylaxis: Lovenox Code Status: Full code Family Communication: Discussed with patient, no family present at bedside Disposition Plan: Watch for 24 hr with advancing diet and reassess    Antimicrobials:    Anti-infectives (From admission, onward)   Start     Dose/Rate Route Frequency Ordered Stop   07/26/18 2200  metroNIDAZOLE (FLAGYL) IVPB 500 mg     500 mg 100 mL/hr over 60 Minutes Intravenous Every 8 hours 07/26/18 1508     07/26/18 2100  cefTRIAXone (ROCEPHIN) 2 g in sodium chloride 0.9 % 100 mL IVPB     2 g 200 mL/hr over 30 Minutes Intravenous Every 24 hours 07/26/18 1508     07/25/18 1000  meropenem (MERREM) 1 g in sodium chloride 0.9 % 100 mL IVPB  Status:  Discontinued     1 g 200 mL/hr over 30 Minutes Intravenous Every 12 hours 07/24/18 2200 07/26/18 1508   07/24/18 2000  meropenem (MERREM) 1 g in sodium chloride 0.9 % 100 mL IVPB     1 g 200 mL/hr over 30 Minutes Intravenous  Once 07/24/18 1940 07/24/18 2156       Subjective:    Continues to improve. Having BM and urinating. Discomfort post BM.  Objective:   Vitals:   07/27/18 1350 07/27/18 2107 07/27/18 2314 07/28/18 0534  BP: 108/82 (!) 144/104  117/75  Pulse: (!) 115 (!) 123  (!) 102  Resp: 16 (!) 24  18  Temp: 98.9 F (37.2 C) (!) 102.3 F (39.1 C) 100 F (37.8 C) 98.5 F (36.9 C)  TempSrc: Oral Oral Oral Oral  SpO2: 98% 96%  99%  Weight:      Height:        Last BM Date: 07/27/18  Intake/Output   Yesterday:  09/28 0701 - 09/29 0700 In: 1560 [P.O.:960; I.V.:300; IV Piggyback:300] Out: 1 [Stool:1] This shift:  Total I/O In: 120 [P.O.:120] Out: -   Bowel function:  Flatus: YES  BM:  YES  Drain: (No drain)  Physical Exam:    General: Pt awake/alert/oriented x4 in no acute distress Skin: no rashes, lesions, ulcers. No induration Eyes: PERRL, lids and conjunctivae normal ENMT: Mucous membranes are moist. No oropharyngeal exudates Neck: normal, supple, no masses, no  thyromegaly Cardiovascular: Regular rate and rhythm, no rubs / gallops. No LE edema. No carotid bruits.  Respiratory: clear to auscultation bilaterally, no wheezing, no crackles. Normal respiratory effort. No accessory muscle use.  Abdomen: Soft.  Nondistended.  Tenderness at LLQ.  No evidence of peritonitis.  No incarcerated hernias. Musculoskeletal: no clubbing / cyanosis. Joint deformity in both hands consistent with RA. No contractures. Normal muscle tone.  Neurologic: CN 2-12 grossly intact. No focal deficits Psychiatric: Normal judgment and insight. Alert and oriented x 3. Normal mood.     Labs:   Results for orders placed or performed during the hospital encounter of 07/24/18 (from the past 48 hour(s))  CBC     Status: Abnormal   Collection Time: 07/27/18  4:20 AM  Result Value Ref Range   WBC 8.1 4.0 - 10.5 K/uL   RBC 3.78 (L) 3.87 - 5.11 MIL/uL   Hemoglobin 10.2 (L) 12.0 - 15.0 g/dL   HCT 32.2 (L) 36.0 - 46.0 %   MCV 85.2 78.0 - 100.0 fL   MCH 27.0 26.0 - 34.0 pg   MCHC 31.7 30.0 - 36.0 g/dL   RDW 15.4 11.5 - 15.5 %   Platelets 396 150 - 400 K/uL    Comment: Performed at [facility], [place] [address], [place], [place] [address]  Basic metabolic panel     Status: Abnormal   Collection Time: 07/27/18  4:20 AM  Result Value Ref Range   Sodium 138 135 - 145 mmol/L   Potassium 4.3 3.5 - 5.1 mmol/L   Chloride 106 98 - 111 mmol/L   CO2 25 22 - 32 mmol/L   Glucose, Bld 93 70 - 99 mg/dL   BUN 13 8 - 23 mg/dL   Creatinine, Ser 0.99 0.44 - 1.00 mg/dL   Calcium 8.3 (L) 8.9 - 10.3 mg/dL   GFR calc non Af Amer 49 (L) >60 mL/min   GFR calc Af Amer 57 (L) >60 mL/min    Comment: (NOTE) The eGFR has been calculated using the CKD EPI equation. This calculation has not been validated in all clinical situations. eGFR's persistently <60 mL/min signify possible Chronic Kidney Disease.    Anion gap 7 5 - 15    Comment: Performed at [facility], [place] [address] [place],  [address]     Imaging / Studies: No results found.    Medication:    . ALPRAZolam  0.25 mg Oral QHS  . aspirin  81 mg Oral Daily  . enoxaparin (LOVENOX) injection  30 mg Subcutaneous QHS  . gabapentin  100 mg Oral QHS  . irbesartan  150 mg Oral Daily  . levothyroxine  75 mcg Oral QAC breakfast  . olopatadine  1 drop Both Eyes BID  . pantoprazole  40 mg Oral Daily  . sodium chloride flush  3 mL Intravenous Q12H    Continuous Infusions: . sodium chloride 75 mL/hr at 07/27/18 1654  . cefTRIAXone (ROCEPHIN)  IV 2 g (07/27/18 2013)  . metronidazole 500 mg (07/28/18 0545)    Time spent: 15 minutes  Signed, Ave Filter, PA-S [place] PA [employer] of [phone]

## 2018-07-29 LAB — CBC
HCT: 30.9 % — ABNORMAL LOW (ref 36.0–46.0)
Hemoglobin: 9.6 g/dL — ABNORMAL LOW (ref 12.0–15.0)
MCH: 26.7 pg (ref 26.0–34.0)
MCHC: 31.1 g/dL (ref 30.0–36.0)
MCV: 86.1 fL (ref 78.0–100.0)
Platelets: 387 10*3/uL (ref 150–400)
RBC: 3.59 MIL/uL — ABNORMAL LOW (ref 3.87–5.11)
RDW: 15.6 % — ABNORMAL HIGH (ref 11.5–15.5)
WBC: 12.5 10*3/uL — ABNORMAL HIGH (ref 4.0–10.5)

## 2018-07-29 LAB — BASIC METABOLIC PANEL
Anion gap: 6 (ref 5–15)
BUN: 8 mg/dL (ref 8–23)
CO2: 25 mmol/L (ref 22–32)
Calcium: 7.9 mg/dL — ABNORMAL LOW (ref 8.9–10.3)
Chloride: 107 mmol/L (ref 98–111)
Creatinine, Ser: 0.98 mg/dL (ref 0.44–1.00)
GFR calc Af Amer: 58 mL/min — ABNORMAL LOW (ref >60)
GFR calc non Af Amer: 50 mL/min — ABNORMAL LOW (ref >60)
Glucose, Bld: 102 mg/dL — ABNORMAL HIGH (ref 70–99)
Potassium: 3.7 mmol/L (ref 3.5–5.1)
Sodium: 138 mmol/L (ref 135–145)

## 2018-07-29 MED ORDER — ACETAMINOPHEN 500 MG PO TABS
1000.0000 mg | ORAL_TABLET | Freq: Three times a day (TID) | ORAL | Status: DC
  Administered 2018-07-29 – 2018-08-01 (×8): 1000 mg via ORAL
  Filled 2018-07-29 (×10): qty 2

## 2018-07-29 MED ORDER — TRAMADOL HCL 50 MG PO TABS
50.0000 mg | ORAL_TABLET | Freq: Three times a day (TID) | ORAL | Status: DC | PRN
  Administered 2018-07-30 – 2018-08-02 (×4): 50 mg via ORAL
  Filled 2018-07-29 (×4): qty 1

## 2018-07-29 MED ORDER — SODIUM CHLORIDE 0.9 % IV SOLN
1.0000 g | Freq: Once | INTRAVENOUS | Status: AC
  Administered 2018-07-29: 1 g via INTRAVENOUS
  Filled 2018-07-29: qty 1

## 2018-07-29 MED ORDER — MORPHINE SULFATE (PF) 2 MG/ML IV SOLN
1.0000 mg | INTRAVENOUS | Status: DC | PRN
  Administered 2018-07-30 (×2): 2 mg via INTRAVENOUS
  Filled 2018-07-29 (×2): qty 1

## 2018-07-29 MED ORDER — SODIUM CHLORIDE 0.9 % IV SOLN
1.0000 g | Freq: Two times a day (BID) | INTRAVENOUS | Status: DC
  Administered 2018-07-30 – 2018-08-02 (×8): 1 g via INTRAVENOUS
  Filled 2018-07-29 (×9): qty 1

## 2018-07-29 MED ORDER — TRAMADOL HCL 50 MG PO TABS: 50.0000 mg | ORAL_TABLET | Freq: Two times a day (BID) | ORAL | Status: DC | PRN

## 2018-07-29 NOTE — Progress Notes (Signed)
CC:  Abdominal pain  Subjective: She is still having pain, low grade fevers, but tolerating full liquids.  BM are the most painful, both sides hurt, but she points to the pain getting better with medicine on the left first.    Objective: Vital signs in last 24 hours: Temp:  [98.9 F (37.2 C)-100.4 F (38 C)] 98.9 F (37.2 C) (09/30 0444) Pulse Rate:  [81-134] 81 (09/30 0444) Resp:  [24] 24 (09/29 1343) BP: (123-165)/(71-106) 137/80 (09/30 0444) SpO2:  [97 %-99 %] 97 % (09/30 0444) Last BM Date: 07/28/18 600 PO 2000 IV BM x 2 Still having low grade fevers, 100.1 last PM,  102.3 and 100 on 07/27/18 BP and HR better WBC up this AM 12.5, BMP OK -  Admit CT 9/25: Acute sigmoid colitis/diverticulitis in the left hemipelvis with areas of suspected pericolonic extraluminal air compatible with microperforation.  No drainable abscess no obstruction, large hiatal hernia. Pain Med 9/29:  Tylenol x 1, fentanyl x 2, Neurontin x 1, morphine x 2 (3 times more since MN) Intake/Output from previous day: 09/29 0701 - 09/30 0700 In: 2619.2 [P.O.:595; I.V.:1503.7; IV Piggyback:520.5] Out: -  Intake/Output this shift: No intake/output data recorded.  General appearance: alert, cooperative and no distress Resp: clear to auscultation bilaterally GI: soft, but still tender, mostly in the LLQ, but also some on the right.    Lab Results:  Recent Labs    07/27/18 0420 07/29/18 0423  WBC 8.1 12.5*  HGB 10.2* 9.6*  HCT 32.2* 30.9*  PLT 396 387    BMET Recent Labs    07/27/18 0420 07/29/18 0423  NA 138 138  K 4.3 3.7  CL 106 107  CO2 25 25  GLUCOSE 93 102*  BUN 13 8  CREATININE 0.99 0.98  CALCIUM 8.3* 7.9*   PT/INR No results for input(s): LABPROT, INR in the last 72 hours.  Recent Labs  Lab 07/24/18 1656  AST 27  ALT 15  ALKPHOS 88  BILITOT 0.4  PROT 8.0  ALBUMIN 3.4*     Lipase     Component Value Date/Time   LIPASE 45 07/24/2018 1656     Medications: .  ALPRAZolam  0.25 mg Oral QHS  . aspirin  81 mg Oral Daily  . enoxaparin (LOVENOX) injection  30 mg Subcutaneous QHS  . gabapentin  100 mg Oral QHS  . irbesartan  150 mg Oral Daily  . levothyroxine  75 mcg Oral QAC breakfast  . olopatadine  1 drop Both Eyes BID  . pantoprazole  40 mg Oral Daily  . sodium chloride flush  3 mL Intravenous Q12H   . sodium chloride 75 mL/hr at 07/29/18 0456  . cefTRIAXone (ROCEPHIN)  IV Stopped (07/28/18 2137)  . metronidazole 500 mg (07/29/18 0524)   Anti-infectives (From admission, onward)   Start     Dose/Rate Route Frequency Ordered Stop   07/26/18 2200  metroNIDAZOLE (FLAGYL) IVPB 500 mg     500 mg 100 mL/hr over 60 Minutes Intravenous Every 8 hours 07/26/18 1508     07/26/18 2100  cefTRIAXone (ROCEPHIN) 2 g in sodium chloride 0.9 % 100 mL IVPB     2 g 200 mL/hr over 30 Minutes Intravenous Every 24 hours 07/26/18 1508     07/25/18 1000  meropenem (MERREM) 1 g in sodium chloride 0.9 % 100 mL IVPB  Status:  Discontinued     1 g 200 mL/hr over 30 Minutes Intravenous Every 12 hours 07/24/18 2200 07/26/18  1508   07/24/18 2000  meropenem (MERREM) 1 g in sodium chloride 0.9 % 100 mL IVPB     1 g 200 mL/hr over 30 Minutes Intravenous  Once 07/24/18 1940 07/24/18 2156      Assessment/Plan Hypertension A fib - not on anticoagulation Mild CKD AS Hypothyroid OA/RA Hx of IBS Neuropathic pain Hyperlipidemia Non-severe (moderate) malnutrition in context of acute illness  Perforated Sigmoid Diverticulitis  FEN: IV fluids/Full liquids ID: Meropenem 9/25 > 9/27        Rocephin 9/27 >> day 4        Flagyl 9/27 >> day 4 DVT: Lovenox Follow up:  TBD  Plan:  I am going to leave her on full liquids for now.  Recheck labs in AM.  With ongoing pain, intermittent fevers and rising WBC I think we will need to repeat the CT.  She does not want surgery if she can help it.  She has had diverticulitis like sx in the past but treated at home.  She has never  had a colonoscopy.   I stopped the fentanyl, put her on scheduled Tylenol and added PO Ultram.  Morphine for pain not relieved by PO meds.         LOS: 5 days    Cavin Longman 07/29/2018 [phone]

## 2018-07-29 NOTE — Progress Notes (Signed)
PROGRESS NOTE    [name]  SWF:[id number] DOB: [birth year] DOA: 07/24/2018 PCP: [name], NP    Brief Narrative:   [name] is a 84 y.o. female with medical history significant of HTN, HLD, A. fib not on anticoagulation, AS, hypothyroidism, OA, and rheumatoid arthritis; who presents with complaints of abdominal pain worse last 1 week. CT scan showed inflammation of the sigmoid colon with signs of microperforation. She was admitted for acute diverticulitis and surgery consulted for recommendations.    Assessment & Plan:   Principal Problem:   Perforation of sigmoid colon due to diverticulitis Active Problems:   Hypothyroidism   Atrial fibrillation, chronic (HCC)   Essential hypertension   Rheumatoid arthritis (HCC)   CKD (chronic kidney disease) stage 2, GFR 60-89 ml/min   Normocytic normochromic anemia   Sepsis (HCC)   Thrombocytosis (HCC)   Malnutrition of moderate degree   Acute sigmoid diverticulitis:  Admitted for IV antibiotics, IV fluids and IV anti emetics.  Bowel rest with NPO, her nausea and pain improved, she was transitioned to clear liquid diet. As she tolerated clears , we transitioned to full liquids, she was able to take full liquid initially, but later the  evening her abd pain returned after a BM . She started having fevers, will change antibiotics to Meropenam as her pain hasn't resolved and fevers. She will probably need a repeat CT abdomen in the next 24 hours if she continues to have abd pain and fevers.  Surgery consulted and recommendations given.  Pain control.    Fever:  Blood cultures done.  If her pain worsens, will discuss with surgery to see if she needs repeat CT abdomen.     Chronic atrial fibrillation;  Rate controlled.     AKI;  Suspect from dehydration.  Continue with IV fluids. Her creatinine improved with IV fluids. Creatinine staying stable.      RA: Appears to be stable at this point.     Hypertension; Well controlled..    Anemia of chronic disease:  Monitor H&H. Baseline hemoglobin around 11.  slightl drop to 9.6. Monitor. She denies any rectal bleeding.    Hiatal hernia:  Stable. On PPI.     Left ovarian cyst/ mass:  CT showed 4.9 x 3.4 cm. Will probably need an MRI of the pelvis to evaluate for ovarian malignancy. Pt refused MRI at this time, and would like to follow up with gyn on discharge.  Pt reports weight loss in the last 1 to 2 months.      DVT prophylaxis: lovenox.  Code Status: full code.  Family Communication: none at bedside.  Disposition Plan: pending clinical improvement.    Consultants:   Surgery.    Procedures: none.   Antimicrobials: IV meropenam to IV rocephin and flagyl.   Subjective: Fever, nauseated, and abdominal pain is persistent. She continues to refuse surgery.   Objective: Vitals:   07/28/18 2019 07/28/18 2214 07/29/18 0444 07/29/18 1301  BP: (!) 165/106 123/71 137/80 113/74  Pulse: (!) 134 94 81 89  Resp:    20  Temp: (!) 100.4 F (38 C) 100.1 F (37.8 C) 98.9 F (37.2 C) 99.1 F (37.3 C)  TempSrc: Oral Oral Oral Oral  SpO2: 98% 99% 97% 99%  Weight:      Height:        Intake/Output Summary (Last 24 hours) at 07/29/2018 1621 Last data filed at 07/29/2018 0456 Gross per 24 hour  Intake 1916.99 ml  Output -  Net 1916.99 ml   Filed Weights   07/25/18 0904  Weight: 50.7 kg    Examination:  General exam: uncomfortable, not in distress.  Respiratory system: good air entry bilateral no wheezing heard.  Cardiovascular system: S1 & S2 heard, RRR. No JVD,  Gastrointestinal system: abdomen is soft tender in LLQ the same. . Bowel sounds good.  Central nervous system: Alert and oriented. Non focal.  Extremities: Symmetric 5 x 5 power. Skin: No rashes, lesions or ulcers Psychiatry: Mood & affect appropriate.     Data Reviewed: I have personally reviewed following labs and imaging  studies  CBC: Recent Labs  Lab 07/24/18 1656 07/25/18 0347 07/27/18 0420 07/29/18 0423  WBC 17.4* 11.7* 8.1 12.5*  NEUTROABS 14.5*  --   --   --   HGB 11.5* 9.9* 10.2* 9.6*  HCT 36.1 31.4* 32.2* 30.9*  MCV 84.7 86.0 85.2 86.1  PLT 452* 390 396 696   Basic Metabolic Panel: Recent Labs  Lab 07/24/18 1656 07/25/18 0347 07/26/18 0419 07/27/18 0420 07/29/18 0423  NA 137 136  --  138 138  K 4.6 4.5  --  4.3 3.7  CL 99 104  --  106 107  CO2 27 25  --  25 25  GLUCOSE 121* 93  --  93 102*  BUN 25* 20  --  13 8  CREATININE 1.18* 1.06* 1.08* 0.99 0.98  CALCIUM 9.1 8.4*  --  8.3* 7.9*  MG  --  1.8  --   --   --    GFR: Estimated Creatinine Clearance: 28 mL/min (by C-G formula based on SCr of 0.98 mg/dL). Liver Function Tests: Recent Labs  Lab 07/24/18 1656  AST 27  ALT 15  ALKPHOS 88  BILITOT 0.4  PROT 8.0  ALBUMIN 3.4*   Recent Labs  Lab 07/24/18 1656  LIPASE 45   No results for input(s): AMMONIA in the last 168 hours. Coagulation Profile: Recent Labs  Lab 07/24/18 2037  INR 1.08   Cardiac Enzymes: No results for input(s): CKTOTAL, CKMB, CKMBINDEX, TROPONINI in the last 168 hours. BNP (last 3 results) No results for input(s): PROBNP in the last 8760 hours. HbA1C: No results for input(s): HGBA1C in the last 72 hours. CBG: No results for input(s): GLUCAP in the last 168 hours. Lipid Profile: No results for input(s): CHOL, HDL, LDLCALC, TRIG, CHOLHDL, LDLDIRECT in the last 72 hours. Thyroid Function Tests: No results for input(s): TSH, T4TOTAL, FREET4, T3FREE, THYROIDAB in the last 72 hours. Anemia Panel: No results for input(s): VITAMINB12, FOLATE, FERRITIN, TIBC, IRON, RETICCTPCT in the last 72 hours. Sepsis Labs: Recent Labs  Lab 07/24/18 2046 07/25/18 0347  PROCALCITON  --  <0.10  LATICACIDVEN 1.22  --     Recent Results (from the past 240 hour(s))  Culture, blood (x 2)     Status: None (Preliminary result)   Collection Time: 07/24/18  9:28 PM   Result Value Ref Range Status   Specimen Description   Final    BLOOD RIGHT WRIST Performed at [facility] [address], [place], [place] [address]    Special Requests   Final    BOTTLES DRAWN AEROBIC AND ANAEROBIC Blood Culture adequate volume Performed at [facility] [address], [place], [place] [address]    Culture   Final    NO GROWTH 4 DAYS Performed at [facility], [place] [address], [place],  [address]    Report Status PENDING  Incomplete  Culture, blood (x 2)     Status: None (Preliminary result)   Collection Time: 07/24/18  9:28 PM  Result Value Ref Range Status   Specimen Description   Final    BLOOD RIGHT WRIST Performed at [facility] [address], [place], [place] [address]    Special Requests   Final    BOTTLES DRAWN AEROBIC AND ANAEROBIC Blood Culture adequate volume Performed at [facility] [address] [place], [place] [address]    Culture   Final    NO GROWTH 4 DAYS Performed at [facility], [place] [address] [place], [place] [address]    Report Status PENDING  Incomplete  Culture, blood (Routine X 2) w Reflex to ID Panel     Status: None (Preliminary result)   Collection Time: 07/28/18  3:10 PM  Result Value Ref Range Status   Specimen Description   Final    BLOOD LEFT ANTECUBITAL Performed at [facility] [address], [place], [place] [address]    Special Requests   Final    BOTTLES DRAWN AEROBIC ONLY Blood Culture adequate volume Performed at [facility] [address] [place], [place] [address]    Culture   Final    NO GROWTH < 24 HOURS Performed at [facility] [address], [place], [place] [address]    Report Status PENDING  Incomplete  Culture, blood (Routine X 2) w Reflex to ID Panel     Status: None (Preliminary result)   Collection Time: 07/28/18  3:12 PM  Result Value Ref  Range Status   Specimen Description   Final    BLOOD LEFT HAND Performed at [facility] [address], [place], [place] [address]    Special Requests   Final    BOTTLES DRAWN AEROBIC ONLY Blood Culture adequate volume Performed at [facility] [address], [place], [place] [address]    Culture   Final    NO GROWTH < 24 HOURS Performed at [facility] [address], [place], [place] [address]    Report Status PENDING  Incomplete         Radiology Studies: No results found.      Scheduled Meds: . acetaminophen  1,000 mg Oral Q8H  . ALPRAZolam  0.25 mg Oral QHS  . aspirin  81 mg Oral Daily  . enoxaparin (LOVENOX) injection  30 mg Subcutaneous QHS  . gabapentin  100 mg Oral QHS  . irbesartan  150 mg Oral Daily  . levothyroxine  75 mcg Oral QAC breakfast  . olopatadine  1 drop Both Eyes BID  . pantoprazole  40 mg Oral Daily  . sodium chloride flush  3 mL Intravenous Q12H   Continuous Infusions: . sodium chloride 75 mL/hr at 07/29/18 0456  . [START ON 07/30/2018] meropenem (MERREM) IV       LOS: 5 days    Time spent: 25 min.    Hosie Poisson, MD [facility] Pager [phone] If 7PM-7AM, please contact night-coverage [email or web address] Password [email or web address] 07/29/2018, 4:21 PM

## 2018-07-29 NOTE — Progress Notes (Signed)
Pharmacy Antibiotic Note  [name] is a 84 y.o. female admitted on 07/24/2018 with perforated diverticulitis. Patient initially placed on Meropenem, then transitioned to Ceftriaxone and Metronidazole on 9/27, as patient was improving. Antibiotics transitioned back to Meropenem today, as having low grade fevers, WBC increased, ongoing pain.  Pharmacy has been consulted for Meropenem dosing.  Plan:  Meropenem 1g IV q12h  Follow up renal function, culture results, and clinical course.   Height: 5' (152.4 cm) Weight: 111 lb 12.8 oz (50.7 kg) IBW/kg (Calculated) : 45.5  Temp (24hrs), Avg:99.7 F (37.6 C), Min:98.9 F (37.2 C), Max:100.4 F (38 C)  Recent Labs  Lab 07/24/18 1656 07/24/18 2046 07/25/18 0347 07/26/18 0419 07/27/18 0420 07/29/18 0423  WBC 17.4*  --  11.7*  --  8.1 12.5*  CREATININE 1.18*  --  1.06* 1.08* 0.99 0.98  LATICACIDVEN  --  1.22  --   --   --   --     Estimated Creatinine Clearance: 28 mL/min (by C-G formula based on SCr of 0.98 mg/dL).    Allergies  Allergen Reactions  . Infliximab Other (See Comments)    REACTION: "Enlarged intestines and hernia. Also pushed intestines on lower part of lungs." REACTION: "Enlarged intestines and hernia. Also pushed intestines on lower part of lungs."  . Aspirin Nausea And Vomiting    Low Dose is ok Stomach problems  . Carvedilol Other (See Comments)    Fatigue/ ill Fatigue/ ill  . Codeine Nausea And Vomiting    "Deathly Sick"  . Penicillins Rash    Has patient had a PCN reaction causing immediate rash, facial/tongue/throat swelling, SOB or lightheadedness with hypotension: Y Has patient had a PCN reaction causing severe rash involving mucus membranes or skin necrosis: Y Has patient had a PCN reaction that required hospitalization: N Has patient had a PCN reaction occurring within the last 10 years: N If all of the above answers are "NO", then may proceed with Cephalosporin use.      Antimicrobials  this admission: 9/25 Meropenem >>9/27, resumed 9/30 >> 9/27 Ceftriaxone >> 9/30 9/27 Metronidazole >> 9/30  Dose adjustments this admission: --  Microbiology results: 9/25BCx: NGTD 9/29 BCx: sent    Lindell Spar, PharmD, BCPS Pager: [phone] 07/29/2018 12:02 PM

## 2018-07-30 LAB — COMPREHENSIVE METABOLIC PANEL
ALT: 10 U/L (ref 0–44)
AST: 19 U/L (ref 15–41)
Albumin: 2.4 g/dL — ABNORMAL LOW (ref 3.5–5.0)
Alkaline Phosphatase: 56 U/L (ref 38–126)
Anion gap: 8 (ref 5–15)
BUN: 7 mg/dL — ABNORMAL LOW (ref 8–23)
CO2: 23 mmol/L (ref 22–32)
Calcium: 8 mg/dL — ABNORMAL LOW (ref 8.9–10.3)
Chloride: 108 mmol/L (ref 98–111)
Creatinine, Ser: 0.87 mg/dL (ref 0.44–1.00)
GFR calc Af Amer: >60 mL/min (ref >60)
GFR calc non Af Amer: 57 mL/min — ABNORMAL LOW (ref >60)
Glucose, Bld: 105 mg/dL — ABNORMAL HIGH (ref 70–99)
Potassium: 3.2 mmol/L — ABNORMAL LOW (ref 3.5–5.1)
Sodium: 139 mmol/L (ref 135–145)
Total Bilirubin: 0.6 mg/dL (ref 0.3–1.2)
Total Protein: 5.4 g/dL — ABNORMAL LOW (ref 6.5–8.1)

## 2018-07-30 LAB — CULTURE, BLOOD (ROUTINE X 2)
Culture: NO GROWTH
Culture: NO GROWTH
Special Requests: ADEQUATE
Special Requests: ADEQUATE

## 2018-07-30 LAB — PREALBUMIN: Prealbumin: <5 mg/dL — ABNORMAL LOW (ref 18–38)

## 2018-07-30 LAB — CBC
HCT: 29.5 % — ABNORMAL LOW (ref 36.0–46.0)
Hemoglobin: 9.4 g/dL — ABNORMAL LOW (ref 12.0–15.0)
MCH: 27.3 pg (ref 26.0–34.0)
MCHC: 31.9 g/dL (ref 30.0–36.0)
MCV: 85.8 fL (ref 78.0–100.0)
Platelets: 331 10*3/uL (ref 150–400)
RBC: 3.44 MIL/uL — ABNORMAL LOW (ref 3.87–5.11)
RDW: 15.6 % — ABNORMAL HIGH (ref 11.5–15.5)
WBC: 10.6 10*3/uL — ABNORMAL HIGH (ref 4.0–10.5)

## 2018-07-30 LAB — C DIFFICILE QUICK SCREEN W PCR REFLEX
C Diff antigen: NEGATIVE
C Diff interpretation: NOT DETECTED
C Diff toxin: NEGATIVE

## 2018-07-30 LAB — MAGNESIUM: Magnesium: 1.4 mg/dL — ABNORMAL LOW (ref 1.7–2.4)

## 2018-07-30 MED ORDER — POTASSIUM CHLORIDE CRYS ER 20 MEQ PO TBCR
40.0000 meq | EXTENDED_RELEASE_TABLET | Freq: Once | ORAL | Status: AC
  Administered 2018-07-30: 40 meq via ORAL
  Filled 2018-07-30: qty 2

## 2018-07-30 MED ORDER — MAGNESIUM SULFATE 2 GM/50ML IV SOLN
2.0000 g | Freq: Once | INTRAVENOUS | Status: AC
  Administered 2018-07-30: 2 g via INTRAVENOUS
  Filled 2018-07-30: qty 50

## 2018-07-30 MED ORDER — SACCHAROMYCES BOULARDII 250 MG PO CAPS
250.0000 mg | ORAL_CAPSULE | Freq: Two times a day (BID) | ORAL | Status: DC
  Administered 2018-07-30 – 2018-08-02 (×7): 250 mg via ORAL
  Filled 2018-07-30 (×7): qty 1

## 2018-07-30 MED ORDER — PREMIER PROTEIN SHAKE
11.0000 [oz_av] | ORAL | Status: DC
  Administered 2018-07-30: 11 [oz_av] via ORAL
  Filled 2018-07-30 (×4): qty 325.31

## 2018-07-30 MED ORDER — KCL IN DEXTROSE-NACL 40-5-0.9 MEQ/L-%-% IV SOLN
INTRAVENOUS | Status: DC
  Administered 2018-07-30: 23:00:00 via INTRAVENOUS
  Filled 2018-07-30 (×2): qty 1000

## 2018-07-30 NOTE — Progress Notes (Signed)
PROGRESS NOTE    Talli Kimmer  HQP:[id number] DOB: 07-04-1927 DOA: 07/24/2018 PCP: [name], NP    Brief Narrative:   Elspeth Blucher is a 84 y.o. female with medical history significant of HTN, HLD, A. fib not on anticoagulation, AS, hypothyroidism, OA, and rheumatoid arthritis; who presents with complaints of abdominal pain worse last 1 week. CT scan showed inflammation of the sigmoid colon with signs of microperforation. She was admitted for acute diverticulitis and surgery consulted for recommendations.    Assessment & Plan:   Principal Problem:   Perforation of sigmoid colon due to diverticulitis Active Problems:   Hypothyroidism   Atrial fibrillation, chronic   Essential hypertension   Rheumatoid arthritis (HCC)   CKD (chronic kidney disease) stage 2, GFR 60-89 ml/min   Normocytic normochromic anemia   Sepsis (HCC)   Thrombocytosis (HCC)   Malnutrition of moderate degree   Acute sigmoid diverticulitis:  Admitted for IV antibiotics, IV fluids and IV anti emetics.  Bowel rest with NPO, her nausea and pain improved, she was transitioned to clear liquid diet. As she tolerated clears , we transitioned to full liquids, she was able to take full liquid initially, but later the  evening her abd pain returned after a BM . She started having fevers, changed antibiotics to Meropenam as her pain hasn't resolved.Marland Kitchen She will probably need a repeat CT abdomen in the next 24 hours if she continues to have abd pain and fevers. Today she is afebrile but her pain remains persistent. Repeat CT abd and pelvis. Surgery consulted and recommendations given.  Pain control.    Chronic atrial fibrillation;  Rate controlled.    AKI;  Suspect from dehydration.  Continue with IV fluids. Her creatinine improved with IV fluids. Creatinine staying stable.     RA: Appears to be stable at this point.    Hypertension; Well controlled..   Hypokalemia and hypomagnesemia:  replaced and repeat in am.    Anemia of chronic disease:  Monitor H&H. Baseline hemoglobin around 11.  slightl drop to 9.6 >9.4. Monitor. She denies any rectal bleeding.    Hiatal hernia:  Stable. On PPI.     Left ovarian cyst/ mass:  CT showed 4.9 x 3.4 cm. Will probably need an MRI of the pelvis to evaluate for ovarian malignancy. Pt refused MRI at this time, and would like to follow up with gyn on discharge.  Pt reports weight loss in the last 1 to 2 months.      DVT prophylaxis: lovenox.  Code Status: full code.  Family Communication: none at bedside.  Disposition Plan: pending clinical improvement.    Consultants:   Surgery.    Procedures: none.   Antimicrobials: IV meropenam   Subjective: abd pain persistent, nausea is better, but continues to have multiple liquid stools.   Objective: Vitals:   07/29/18 1301 07/29/18 2116 07/30/18 0558 07/30/18 1422  BP: 113/74 113/78 124/79 123/79  Pulse: 89 96 87 89  Resp: 20 18 20 17   Temp: 99.1 F (37.3 C) 98.2 F (36.8 C) 98 F (36.7 C) 97.9 F (36.6 C)  TempSrc: Oral   Oral  SpO2: 99% 99% 100% 100%  Weight:      Height:        Intake/Output Summary (Last 24 hours) at 07/30/2018 1940 Last data filed at 07/30/2018 1800 Gross per 24 hour  Intake 2266.43 ml  Output 200 ml  Net 2066.43 ml   Filed Weights   07/25/18 0904  Weight: 50.7 kg    Examination:  General exam: uncomfortable, no  Distress noted.  Respiratory system: bilateral air entry , no wheezing or rhonchi.   Cardiovascular system: S1 & S2 heard, RRR. No JVD, no murmer.  Gastrointestinal system: abdomen is soft continues to have tenderness in the LLQ. Bowel sounds remain good.  Central nervous system: Alert and oriented. Non focal.  Extremities: Symmetric 5 x 5 power. No pedal edema.  Skin: No rashes, lesions or ulcers Psychiatry: Mood & affect appropriate.     Data Reviewed: I have personally reviewed following labs and imaging  studies  CBC: Recent Labs  Lab 07/24/18 1656 07/25/18 0347 07/27/18 0420 07/29/18 0423 07/30/18 0442  WBC 17.4* 11.7* 8.1 12.5* 10.6*  NEUTROABS 14.5*  --   --   --   --   HGB 11.5* 9.9* 10.2* 9.6* 9.4*  HCT 36.1 31.4* 32.2* 30.9* 29.5*  MCV 84.7 86.0 85.2 86.1 85.8  PLT 452* 390 396 387 720   Basic Metabolic Panel: Recent Labs  Lab 07/24/18 1656 07/25/18 0347 07/26/18 0419 07/27/18 0420 07/29/18 0423 07/30/18 0442  NA 137 136  --  138 138 139  K 4.6 4.5  --  4.3 3.7 3.2*  CL 99 104  --  106 107 108  CO2 27 25  --  25 25 23   GLUCOSE 121* 93  --  93 102* 105*  BUN 25* 20  --  13 8 7*  CREATININE 1.18* 1.06* 1.08* 0.99 0.98 0.87  CALCIUM 9.1 8.4*  --  8.3* 7.9* 8.0*  MG  --  1.8  --   --   --  1.4*   GFR: Estimated Creatinine Clearance: 31.5 mL/min (by C-G formula based on SCr of 0.87 mg/dL). Liver Function Tests: Recent Labs  Lab 07/24/18 1656 07/30/18 0442  AST 27 19  ALT 15 10  ALKPHOS 88 56  BILITOT 0.4 0.6  PROT 8.0 5.4*  ALBUMIN 3.4* 2.4*   Recent Labs  Lab 07/24/18 1656  LIPASE 45   No results for input(s): AMMONIA in the last 168 hours. Coagulation Profile: Recent Labs  Lab 07/24/18 2037  INR 1.08   Cardiac Enzymes: No results for input(s): CKTOTAL, CKMB, CKMBINDEX, TROPONINI in the last 168 hours. BNP (last 3 results) No results for input(s): PROBNP in the last 8760 hours. HbA1C: No results for input(s): HGBA1C in the last 72 hours. CBG: No results for input(s): GLUCAP in the last 168 hours. Lipid Profile: No results for input(s): CHOL, HDL, LDLCALC, TRIG, CHOLHDL, LDLDIRECT in the last 72 hours. Thyroid Function Tests: No results for input(s): TSH, T4TOTAL, FREET4, T3FREE, THYROIDAB in the last 72 hours. Anemia Panel: No results for input(s): VITAMINB12, FOLATE, FERRITIN, TIBC, IRON, RETICCTPCT in the last 72 hours. Sepsis Labs: Recent Labs  Lab 07/24/18 2046 07/25/18 0347  PROCALCITON  --  <0.10  LATICACIDVEN 1.22  --      Recent Results (from the past 240 hour(s))  Culture, blood (x 2)     Status: None   Collection Time: 07/24/18  9:28 PM  Result Value Ref Range Status   Specimen Description   Final    BLOOD RIGHT WRIST Performed at [facility] [address] [place], [place] [address]    Special Requests   Final    BOTTLES DRAWN AEROBIC AND ANAEROBIC Blood Culture adequate volume Performed at [facility] [address] [place], [place] [address]    Culture   Final  NO GROWTH 5 DAYS Performed at [facility], [place] [address] [place], [place] [address]    Report Status 07/30/2018 FINAL  Final  Culture, blood (x 2)     Status: None   Collection Time: 07/24/18  9:28 PM  Result Value Ref Range Status   Specimen Description   Final    BLOOD RIGHT WRIST Performed at [facility] [address] [place], [place] [address]    Special Requests   Final    BOTTLES DRAWN AEROBIC AND ANAEROBIC Blood Culture adequate volume Performed at [facility] [address], [place], [place] [address]    Culture   Final    NO GROWTH 5 DAYS Performed at [facility], [place] [address] [place], [place] [address]    Report Status 07/30/2018 FINAL  Final  Culture, blood (Routine X 2) w Reflex to ID Panel     Status: None (Preliminary result)   Collection Time: 07/28/18  3:10 PM  Result Value Ref Range Status   Specimen Description   Final    BLOOD LEFT ANTECUBITAL Performed at [facility] [address] [place], [place] [address]    Special Requests   Final    BOTTLES DRAWN AEROBIC ONLY Blood Culture adequate volume Performed at [facility] [address], [place], [place] [address]    Culture   Final    NO GROWTH 2 DAYS Performed at [facility] [address], [place], [place] [address]    Report Status PENDING  Incomplete  Culture, blood (Routine X 2) w Reflex  to ID Panel     Status: None (Preliminary result)   Collection Time: 07/28/18  3:12 PM  Result Value Ref Range Status   Specimen Description   Final    BLOOD LEFT HAND Performed at [facility] [address] [place], [place] [address]    Special Requests   Final    BOTTLES DRAWN AEROBIC ONLY Blood Culture adequate volume Performed at [facility] [address], [place], [place] [address]    Culture   Final    NO GROWTH 2 DAYS Performed at [facility] [address] [place], [place] [address]    Report Status PENDING  Incomplete         Radiology Studies: No results found.      Scheduled Meds: . acetaminophen  1,000 mg Oral Q8H  . ALPRAZolam  0.25 mg Oral QHS  . aspirin  81 mg Oral Daily  . enoxaparin (LOVENOX) injection  30 mg Subcutaneous QHS  . gabapentin  100 mg Oral QHS  . irbesartan  150 mg Oral Daily  . levothyroxine  75 mcg Oral QAC breakfast  . olopatadine  1 drop Both Eyes BID  . pantoprazole  40 mg Oral Daily  . protein supplement shake  11 oz Oral Q24H  . saccharomyces boulardii  250 mg Oral BID  . sodium chloride flush  3 mL Intravenous Q12H   Continuous Infusions: . sodium chloride 75 mL/hr at 07/30/18 1700  . dextrose 5 % and 0.9 % NaCl with KCl 40 mEq/L    . meropenem (MERREM) IV Stopped (07/30/18 1310)     LOS: 6 days    Time spent: 25 min.    Hosie Poisson, MD [facility] Pager [phone] If 7PM-7AM, please contact night-coverage [email or web address] Password [email or web address] 07/30/2018, 7:40 PM

## 2018-07-30 NOTE — Evaluation (Signed)
Physical Therapy Evaluation Patient Details Name: [name] MRN: [id number] DOB: [birth year] Today's Date: 07/30/2018   History of Present Illness  84 yo female admitted with sepsis, perforated divertculitis. hx of A fib, aortic stenosis, OA, RA, neuropathy  Clinical Impression  On eval, pt was Supervision-Min guard assist for mobility. She walked ~120 feet while holding on to the IV pole. Mildly unsteady. Discussed d/c plan-pt plans to return home where she lives with her son. Recommend daily ambulation with nursing/family assistance as able. Will follow during hospital stay.     Follow Up Recommendations No PT follow up;Supervision - Intermittent    Equipment Recommendations  None recommended by PT    Recommendations for Other Services       Precautions / Restrictions Precautions Precautions: Fall Restrictions Weight Bearing Restrictions: No      Mobility  Bed Mobility Overal bed mobility: Modified Independent                Transfers Overall transfer level: Needs assistance   Transfers: Sit to/from Stand Sit to Stand: Supervision         General transfer comment: for safety  Ambulation/Gait Ambulation/Gait assistance: Min guard Gait Distance (Feet): 120 Feet Assistive device: IV Pole Gait Pattern/deviations: Step-through pattern;Decreased stride length     General Gait Details: Mildly unsteady. Pt walked a short distance without UE support. She held on to IV pole mostly. No c/o lightheadedeness. Dyspnea 2/4 but pt tolerated distance well  Stairs            Wheelchair Mobility    Modified Rankin (Stroke Patients Only)       Balance Overall balance assessment: Mild deficits observed, not formally tested                                           Pertinent Vitals/Pain Pain Assessment: No/denies pain    Home Living Family/patient expects to be discharged to:: Private residence Living Arrangements:  Children Available Help at Discharge: Family;Available PRN/intermittently Type of Home: House       Home Layout: One level Home Equipment: None      Prior Function Level of Independence: Independent               Hand Dominance        Extremity/Trunk Assessment   Upper Extremity Assessment Upper Extremity Assessment: Overall WFL for tasks assessed    Lower Extremity Assessment Lower Extremity Assessment: Generalized weakness    Cervical / Trunk Assessment Cervical / Trunk Assessment: Normal  Communication   Communication: No difficulties  Cognition Arousal/Alertness: Awake/alert Behavior During Therapy: WFL for tasks assessed/performed Overall Cognitive Status: Within Functional Limits for tasks assessed                                        General Comments      Exercises     Assessment/Plan    PT Assessment Patient needs continued PT services  PT Problem List Decreased mobility       PT Treatment Interventions Gait training;Functional mobility training;Therapeutic activities;Therapeutic exercise;Patient/family education    PT Goals (Current goals can be found in the Care Plan section)  Acute Rehab PT Goals Patient Stated Goal: to get better. home soon PT Goal Formulation: With patient Time For Goal Achievement: 08/13/18  Potential to Achieve Goals: Good    Frequency Min 3X/week   Barriers to discharge        Co-evaluation               AM-PAC PT "6 Clicks" Daily Activity  Outcome Measure Difficulty turning over in bed (including adjusting bedclothes, sheets and blankets)?: None Difficulty moving from lying on back to sitting on the side of the bed? : None Difficulty sitting down on and standing up from a chair with arms (e.g., wheelchair, bedside commode, etc,.)?: A Little Help needed moving to and from a bed to chair (including a wheelchair)?: A Little Help needed walking in hospital room?: A Little Help needed  climbing 3-5 steps with a railing? : A Little 6 Click Score: 20    End of Session   Activity Tolerance: Patient tolerated treatment well Patient left: in bed;with call bell/phone within reach;with bed alarm set   PT Visit Diagnosis: Unsteadiness on feet (R26.81)    Time: 2778-2423 PT Time Calculation (min) (ACUTE ONLY): 14 min   Charges:   PT Evaluation $PT Eval Low Complexity: [place], PT Acute Rehabilitation Services Pager: [phone] Office: [phone]

## 2018-07-30 NOTE — Progress Notes (Signed)
Nutrition Follow-up  DOCUMENTATION CODES:   Non-severe (moderate) malnutrition in context of acute illness/injury  INTERVENTION:    Provide Premier Protein once daily, each supplement provides 160kcal and 30g protein.   Provide Snacks BID  NUTRITION DIAGNOSIS:   Moderate Malnutrition related to acute illness(sigmoid diverticulitis/colitis ) as evidenced by energy intake < 75% for > or equal to 1 month, moderate muscle depletion, mild muscle depletion, percent weight loss.  Ongoing  GOAL:   Patient will meet greater than or equal to 90% of their needs  Not meeting   MONITOR:   PO intake, Supplement acceptance, Diet advancement, Labs, Weight trends, I & O's  REASON FOR ASSESSMENT:   Malnutrition Screening Tool    ASSESSMENT:   Patient with PMH significant for HTN, HLD, A. fib, AS, hypothyroidism, OA, and rheumatoid arthritis. Presents this admission with complaints of abdominal pain worse last 1 week. Found to have acute sigmoid diverticulitis/colitis.    9/27- clear liquid diet 9/29- full liquid diet 10/1- soft diet   Intake is minimal with meal completions charted as 0-25% for her last eight meals. Pt having multiple loose stools per day ikely related to antibiotics/medcations. Pt tried [employer] but had almost immediate diarrhea. Discussed other protein options we could provide pt that may not make stools as loose. We will try cheese and crackers BID with one premier protein.   RD provided "Nutrition and Diverticulitis" handout with supporting information. Provided examples of low and high fiber foods. Discouraged intake of high fiber foods, high fat foods, spicy foods, processed foods, caffeine and red meats when having a flare. Encouraged pt to cook foods until they are soft and chew foods well to help aid in digestion. Also recommend frequent small meals. Encouraged use of a multi-vitamin and protein supplements while having a flare.  RD encourage intake of high  fiber foods when not having a flare.   A weight has not been obtained since 9/26.   Medications reviewed and include: florastor, D5 with 40 mEq @ 75 ml/hr, Mg sulfate, IV abx Labs reviewed: K 3.2 (L) Mg 1.4 (L) corrected calcium 9.3 (wdl)  Diet Order:   Diet Order            DIET SOFT Room service appropriate? Yes; Fluid consistency: Thin  Diet effective now              EDUCATION NEEDS:   Education needs have been addressed  Skin:  Skin Assessment: Reviewed RN Assessment  Last BM:  07/29/18- loose stools  Height:   Ht Readings from Last 1 Encounters:  07/25/18 5' (1.524 m)    Weight:   Wt Readings from Last 1 Encounters:  07/25/18 50.7 kg    Ideal Body Weight:  45.5 kg  BMI:  Body mass index is 21.83 kg/m.  Estimated Nutritional Needs:   Kcal:  1500-1700 kcal  Protein:  75-90 grams  Fluid:  >/= 1.5 L/day  Mariana Single RD, LDN Clinical Nutrition Pager # - [phone]

## 2018-07-30 NOTE — Progress Notes (Addendum)
CC: Abdominal pain  Subjective: She still complains of pain, but didn't really take much yesterday after I switched her to some more PO meds, says she is having allot of loose stool.  Objective: Vital signs in last 24 hours: Temp:  [98 F (36.7 C)-99.1 F (37.3 C)] 98 F (36.7 C) (10/01 0558) Pulse Rate:  [87-96] 87 (10/01 0558) Resp:  [18-20] 20 (10/01 0558) BP: (113-124)/(74-79) 124/79 (10/01 0558) SpO2:  [99 %-100 %] 100 % (10/01 0558) Last BM Date: 07/29/18 720 p.o. 1175 IV Urine x3 Stool x1 T-max 99.1 fever curve improving WBC down to 10.6 K+ 3.2 Prealbumin:  less than 5 Intake/Output from previous day: 09/30 0701 - 10/01 0700 In: 2095.3 [P.O.:720; I.V.:1175.3; IV Piggyback:200] Out: -  Intake/Output this shift: No intake/output data recorded.  General appearance: alert, cooperative and no distress Resp: clear to auscultation bilaterally GI: I cannot really tell if she is less tender more tender than yesterday.  Abdomen soft, she is having multiple loose stools she reports at least 4 yesterday positive bowel sounds. He had one stool recorded for yesterday not for that she claims. Lab Results:  Recent Labs    07/29/18 0423 07/30/18 0442  WBC 12.5* 10.6*  HGB 9.6* 9.4*  HCT 30.9* 29.5*  PLT 387 331    BMET Recent Labs    07/29/18 0423 07/30/18 0442  NA 138 139  K 3.7 3.2*  CL 107 108  CO2 25 23  GLUCOSE 102* 105*  BUN 8 7*  CREATININE 0.98 0.87  CALCIUM 7.9* 8.0*   PT/INR No results for input(s): LABPROT, INR in the last 72 hours.  Recent Labs  Lab 07/24/18 1656 07/30/18 0442  AST 27 19  ALT 15 10  ALKPHOS 88 56  BILITOT 0.4 0.6  PROT 8.0 5.4*  ALBUMIN 3.4* 2.4*     Lipase     Component Value Date/Time   LIPASE 45 07/24/2018 1656     Medications: . acetaminophen  1,000 mg Oral Q8H  . ALPRAZolam  0.25 mg Oral QHS  . aspirin  81 mg Oral Daily  . enoxaparin (LOVENOX) injection  30 mg Subcutaneous QHS  . gabapentin  100 mg  Oral QHS  . irbesartan  150 mg Oral Daily  . levothyroxine  75 mcg Oral QAC breakfast  . olopatadine  1 drop Both Eyes BID  . pantoprazole  40 mg Oral Daily  . potassium chloride  40 mEq Oral Once  . sodium chloride flush  3 mL Intravenous Q12H   Anti-infectives (From admission, onward)   Start     Dose/Rate Route Frequency Ordered Stop   07/30/18 0000  meropenem (MERREM) 1 g in sodium chloride 0.9 % 100 mL IVPB     1 g 200 mL/hr over 30 Minutes Intravenous Every 12 hours 07/29/18 1157     07/29/18 1100  meropenem (MERREM) 1 g in sodium chloride 0.9 % 100 mL IVPB     1 g 200 mL/hr over 30 Minutes Intravenous  Once 07/29/18 1032 07/29/18 1248   07/26/18 2200  metroNIDAZOLE (FLAGYL) IVPB 500 mg  Status:  Discontinued     500 mg 100 mL/hr over 60 Minutes Intravenous Every 8 hours 07/26/18 1508 07/29/18 1031   07/26/18 2100  cefTRIAXone (ROCEPHIN) 2 g in sodium chloride 0.9 % 100 mL IVPB  Status:  Discontinued     2 g 200 mL/hr over 30 Minutes Intravenous Every 24 hours 07/26/18 1508 07/29/18 1031   07/25/18 1000  meropenem (MERREM) 1 g in sodium chloride 0.9 % 100 mL IVPB  Status:  Discontinued     1 g 200 mL/hr over 30 Minutes Intravenous Every 12 hours 07/24/18 2200 07/26/18 1508   07/24/18 2000  meropenem (MERREM) 1 g in sodium chloride 0.9 % 100 mL IVPB     1 g 200 mL/hr over 30 Minutes Intravenous  Once 07/24/18 1940 07/24/18 2156      Assessment/Plan  Hypertension A fib - not on anticoagulation Mild CKD AS Hypothyroid OA/RA Hx of IBS Neuropathic pain Hyperlipidemia Severe malnutrition Hypokalemia - K+ being replace, Mag 1.4 - replacing with IV mag sulfate 2 gm  Perforated Sigmoid Diverticulitis  FEN: IV fluids/Full liquids ID: Meropenem 9/25 > 9/27;   9/30 >> day 2 Rocephin 9/27 >> 9/30 Flagyl 9/27 >> 9/30 DVT: Lovenox Follow up:  TBD  Plan: With her improved fever curve and WBC, I am going to hold off on the CT scan today.  Today will be the  second day back on meropenem.  Check for C. difficile.  Put her on a probiotic.  Put her on a soft diet and asked nutrition to help with her protein.  Recheck her labs tomorrow and if she continues to improve, continue the current course.   If she has not improved will consider CT tomorrow.  Her original CT was on 07/24/2018.  Ask PT to see and see if we can get her moving some, I have not seen her OOB so far.  LOS: 6 days    Greer Wainright 07/30/2018 [phone]

## 2018-07-30 NOTE — Care Management Important Message (Signed)
Important Message  Patient Details  Name: [name] MRN: [id number] Date of Birth: [birth year]   Medicare Important Message Given:  Yes    Kerin Salen 07/30/2018, 12:53 [place] Message  Patient Details  Name: [name] MRN: [id number] Date of Birth: November 04, 1926   Medicare Important Message Given:  Yes    Kerin Salen 07/30/2018, 12:53 PM

## 2018-07-31 ENCOUNTER — Inpatient Hospital Stay (HOSPITAL_COMMUNITY): Payer: Medicare Other

## 2018-07-31 DIAGNOSIS — K5720 Diverticulitis of large intestine with perforation and abscess without bleeding: Principal | ICD-10-CM

## 2018-07-31 LAB — CBC
HCT: 30.8 % — ABNORMAL LOW (ref 36.0–46.0)
Hemoglobin: 9.9 g/dL — ABNORMAL LOW (ref 12.0–15.0)
MCH: 27.6 pg (ref 26.0–34.0)
MCHC: 32.1 g/dL (ref 30.0–36.0)
MCV: 85.8 fL (ref 78.0–100.0)
Platelets: 387 10*3/uL (ref 150–400)
RBC: 3.59 MIL/uL — ABNORMAL LOW (ref 3.87–5.11)
RDW: 15.9 % — ABNORMAL HIGH (ref 11.5–15.5)
WBC: 9.8 10*3/uL (ref 4.0–10.5)

## 2018-07-31 LAB — BASIC METABOLIC PANEL
Anion gap: 8 (ref 5–15)
BUN: 7 mg/dL — ABNORMAL LOW (ref 8–23)
CO2: 23 mmol/L (ref 22–32)
Calcium: 8.2 mg/dL — ABNORMAL LOW (ref 8.9–10.3)
Chloride: 110 mmol/L (ref 98–111)
Creatinine, Ser: 0.85 mg/dL (ref 0.44–1.00)
GFR calc Af Amer: >60 mL/min (ref >60)
GFR calc non Af Amer: 59 mL/min — ABNORMAL LOW (ref >60)
Glucose, Bld: 100 mg/dL — ABNORMAL HIGH (ref 70–99)
Potassium: 4.5 mmol/L (ref 3.5–5.1)
Sodium: 141 mmol/L (ref 135–145)

## 2018-07-31 MED ORDER — KCL IN DEXTROSE-NACL 40-5-0.9 MEQ/L-%-% IV SOLN
INTRAVENOUS | Status: DC
  Administered 2018-07-31: 13:00:00 via INTRAVENOUS
  Filled 2018-07-31: qty 1000

## 2018-07-31 MED ORDER — SODIUM CHLORIDE 0.9 % IV SOLN
INTRAVENOUS | Status: DC | PRN
  Administered 2018-08-01 (×2): 250 mL via INTRAVENOUS

## 2018-07-31 MED ORDER — IOPAMIDOL (ISOVUE-300) INJECTION 61%
15.0000 mL | Freq: Once | INTRAVENOUS | Status: DC | PRN
  Administered 2018-07-31: 15 mL via ORAL
  Filled 2018-07-31: qty 30

## 2018-07-31 MED ORDER — SODIUM CHLORIDE 0.9 % IJ SOLN
INTRAMUSCULAR | Status: AC
  Filled 2018-07-31: qty 50

## 2018-07-31 MED ORDER — KCL IN DEXTROSE-NACL 20-5-0.9 MEQ/L-%-% IV SOLN: INTRAVENOUS | Status: DC

## 2018-07-31 MED ORDER — IOPAMIDOL (ISOVUE-300) INJECTION 61%
INTRAVENOUS | Status: AC
  Administered 2018-07-31: 15 mL
  Filled 2018-07-31: qty 30

## 2018-07-31 MED ORDER — IOHEXOL 300 MG/ML  SOLN
100.0000 mL | Freq: Once | INTRAMUSCULAR | Status: AC | PRN
  Administered 2018-07-31: 100 mL via INTRAVENOUS

## 2018-07-31 NOTE — Progress Notes (Signed)
CC:  Abdominal pain  Subjective: She looks better and feels better.  Still complaining of going to [place] to much.  She pain is clearly better today.  She is drinking contrast for the CT today. She says she didn't eat anything yesterday.  She says she did walk some.  She lives with her son at home.  .   Objective: Vital signs in last 24 hours: Temp:  [97.7 F (36.5 C)-97.9 F (36.6 C)] 97.7 F (36.5 C) (10/02 0426) Pulse Rate:  [79-89] 85 (10/02 0426) Resp:  [17-18] 18 (10/02 0426) BP: (123-138)/(75-79) 129/77 (10/02 0426) SpO2:  [100 %] 100 % (10/02 0426) Last BM Date: 07/30/18 PO not recorded BM x 2 recorded 1300 IV Urine x 2 recorded Afebrile, VSS K+ up to 4.5 WBC down to 9.8 C diff is negative  CT ordered by Dr. Karleen Hampshire pending Pain:  Tylenol 3000 mg, Neurontin x 1, morphine x 2, ultram x 2  Intake/Output from previous day: 10/01 0701 - 10/02 0700 In: 1321.8 [I.V.:1171.8; IV Piggyback:150] Out: 200 [Urine:200] Intake/Output this shift: No intake/output data recorded.  General appearance: alert, cooperative and no distress GI: soft, minimal tenderness on exam today. + BS and + BM  Lab Results:  Recent Labs    07/30/18 0442 07/31/18 0438  WBC 10.6* 9.8  HGB 9.4* 9.9*  HCT 29.5* 30.8*  PLT 331 387    BMET Recent Labs    07/30/18 0442 07/31/18 0438  NA 139 141  K 3.2* 4.5  CL 108 110  CO2 23 23  GLUCOSE 105* 100*  BUN 7* 7*  CREATININE 0.87 0.85  CALCIUM 8.0* 8.2*   PT/INR No results for input(s): LABPROT, INR in the last 72 hours.  Recent Labs  Lab 07/24/18 1656 07/30/18 0442  AST 27 19  ALT 15 10  ALKPHOS 88 56  BILITOT 0.4 0.6  PROT 8.0 5.4*  ALBUMIN 3.4* 2.4*     Lipase     Component Value Date/Time   LIPASE 45 07/24/2018 1656     Medications: . acetaminophen  1,000 mg Oral Q8H  . ALPRAZolam  0.25 mg Oral QHS  . aspirin  81 mg Oral Daily  . enoxaparin (LOVENOX) injection  30 mg Subcutaneous QHS  . gabapentin  100 mg Oral  QHS  . irbesartan  150 mg Oral Daily  . levothyroxine  75 mcg Oral QAC breakfast  . olopatadine  1 drop Both Eyes BID  . pantoprazole  40 mg Oral Daily  . protein supplement shake  11 oz Oral Q24H  . saccharomyces boulardii  250 mg Oral BID  . sodium chloride flush  3 mL Intravenous Q12H   . dextrose 5 % and 0.9 % NaCl with KCl 40 mEq/L 75 mL/hr at 07/30/18 2239  . meropenem (MERREM) IV 1 g (07/30/18 2346)   Anti-infectives (From admission, onward)   Start     Dose/Rate Route Frequency Ordered Stop   07/30/18 0000  meropenem (MERREM) 1 g in sodium chloride 0.9 % 100 mL IVPB     1 g 200 mL/hr over 30 Minutes Intravenous Every 12 hours 07/29/18 1157     07/29/18 1100  meropenem (MERREM) 1 g in sodium chloride 0.9 % 100 mL IVPB     1 g 200 mL/hr over 30 Minutes Intravenous  Once 07/29/18 1032 07/29/18 1248   07/26/18 2200  metroNIDAZOLE (FLAGYL) IVPB 500 mg  Status:  Discontinued     500 mg 100 mL/hr over 60  Minutes Intravenous Every 8 hours 07/26/18 1508 07/29/18 1031   07/26/18 2100  cefTRIAXone (ROCEPHIN) 2 g in sodium chloride 0.9 % 100 mL IVPB  Status:  Discontinued     2 g 200 mL/hr over 30 Minutes Intravenous Every 24 hours 07/26/18 1508 07/29/18 1031   07/25/18 1000  meropenem (MERREM) 1 g in sodium chloride 0.9 % 100 mL IVPB  Status:  Discontinued     1 g 200 mL/hr over 30 Minutes Intravenous Every 12 hours 07/24/18 2200 07/26/18 1508   07/24/18 2000  meropenem (MERREM) 1 g in sodium chloride 0.9 % 100 mL IVPB     1 g 200 mL/hr over 30 Minutes Intravenous  Once 07/24/18 1940 07/24/18 2156     Assessment/Plan  Hypertension A fib - not on anticoagulation Mild CKD AS Hypothyroid OA/RA Hx of IBS Neuropathic pain Hyperlipidemia Severe malnutrition Hypokalemia - resolved  Perforated Sigmoid Diverticulitis  FEN: IV fluids/soft diet ID: Meropenem 9/25 >9/27;   9/30 >> day 3 Rocephin 9/27 >>9/30 Flagyl 9/27 >>9/30 DVT: Lovenox Follow up:  TBD  Plan:  Continue current RX, await CT results  LOS: 7 days    Daeshaun Specht 07/31/2018 [phone]

## 2018-07-31 NOTE — Plan of Care (Signed)
  Problem: Skin Integrity: Goal: Risk for impaired skin integrity will decrease Outcome: Progressing   

## 2018-07-31 NOTE — Progress Notes (Signed)
PROGRESS NOTE [facility]   [name]   HWE:[id number] DOB: 04-25-1927  DOA: 07/24/2018 PCP: [name], NP   Brief Narrative:  [name] is a 84 year old female with medical history significant for hypertension, hyperlipidemia, A. fib not on anticoagulation, ACS, hypothyroidism and rheumatoid arthritis presented to the emergency department complaining of abdominal pain.  Upon ED evaluation CT scan showed inflammation of the sigmoid colon with signs of microperforation and pattern of diverticulitis.  Patient was admitted with working diagnosis of acute diverticulitis and surgery was consulted.  Subjective: Patient seen and examined, she continues to complain of abdominal pain.  Denies nausea and vomiting.  Per nursing staff she is improved from yesterday.  Assessment & Plan: Acute sigmoid diverticulitis General surgery consulted and recommendations appreciated.  Currently on Merrem due to persistent fevers and abdominal pain.  Repeated CT abdomen 10/2 shows persistent inflammatory/infectious changes.  No drainable abscess.  Will continue current regimen.  Advance diet as tolerated.  She will need a repeat CT scan in 2 to 3 weeks.  AKI From dehydration, creatinine improved with IV fluid.  Avoid nephrotoxic agents and hypotension.  Check BMP in a.m.  Chronic A. fib Rate well controlled, not on any anticoagulation.  Anemia of chronic disease Hemoglobin stable, no signs of overt bleeding.  Trend CBC  Left ovarian cyst/mass Incidental finding on CT showing 4.9 x 3.4 cm.  Patient will need outpatient MRIs of the pelvis to evaluate for ovarian malignancy.  OB/GYN follow-up of discharge.  DVT prophylaxis: Lovenox Code Status: Full code Family Communication: None at bedside Disposition Plan: Home when clinically improved   Consultants:   Gen Surgery   Procedures:   None   Antimicrobials: Anti-infectives (From admission, onward)   Start      Dose/Rate Route Frequency Ordered Stop   07/30/18 0000  meropenem (MERREM) 1 g in sodium chloride 0.9 % 100 mL IVPB     1 g 200 mL/hr over 30 Minutes Intravenous Every 12 hours 07/29/18 1157     07/29/18 1100  meropenem (MERREM) 1 g in sodium chloride 0.9 % 100 mL IVPB     1 g 200 mL/hr over 30 Minutes Intravenous  Once 07/29/18 1032 07/29/18 1248   07/26/18 2200  metroNIDAZOLE (FLAGYL) IVPB 500 mg  Status:  Discontinued     500 mg 100 mL/hr over 60 Minutes Intravenous Every 8 hours 07/26/18 1508 07/29/18 1031   07/26/18 2100  cefTRIAXone (ROCEPHIN) 2 g in sodium chloride 0.9 % 100 mL IVPB  Status:  Discontinued     2 g 200 mL/hr over 30 Minutes Intravenous Every 24 hours 07/26/18 1508 07/29/18 1031   07/25/18 1000  meropenem (MERREM) 1 g in sodium chloride 0.9 % 100 mL IVPB  Status:  Discontinued     1 g 200 mL/hr over 30 Minutes Intravenous Every 12 hours 07/24/18 2200 07/26/18 1508   07/24/18 2000  meropenem (MERREM) 1 g in sodium chloride 0.9 % 100 mL IVPB     1 g 200 mL/hr over 30 Minutes Intravenous  Once 07/24/18 1940 07/24/18 2156          Objective: Vitals:   07/30/18 1422 07/30/18 2126 07/31/18 0426 07/31/18 1349  BP: 123/79 138/75 129/77 (!) 133/94  Pulse: 89 79 85 (!) 111  Resp: 17 18 18 18   Temp: 97.9 F (36.6 C) 97.7 F (36.5 C) 97.7 F (36.5 C) 98.3 F (36.8 C)  TempSrc: Oral   Oral  SpO2: 100% 100% 100%  100%  Weight:      Height:        Intake/Output Summary (Last 24 hours) at 07/31/2018 1716 Last data filed at 07/30/2018 1800 Gross per 24 hour  Intake -  Output 200 ml  Net -200 ml   Filed Weights   07/25/18 0904  Weight: 50.7 kg    Examination:  General: NAD Cardiovascular: RRR, S1/S2 +, no rubs, no gallops Respiratory: CTA bilaterally, no wheezing, no rhonchi Abdominal: Soft, NT, ND, bowel sounds + Extremities: no edema  Data Reviewed: I have personally reviewed following labs and imaging studies  CBC: Recent Labs  Lab 07/25/18 0347  07/27/18 0420 07/29/18 0423 07/30/18 0442 07/31/18 0438  WBC 11.7* 8.1 12.5* 10.6* 9.8  HGB 9.9* 10.2* 9.6* 9.4* 9.9*  HCT 31.4* 32.2* 30.9* 29.5* 30.8*  MCV 86.0 85.2 86.1 85.8 85.8  PLT 390 396 387 331 500   Basic Metabolic Panel: Recent Labs  Lab 07/25/18 0347 07/26/18 0419 07/27/18 0420 07/29/18 0423 07/30/18 0442 07/31/18 0438  NA 136  --  138 138 139 141  K 4.5  --  4.3 3.7 3.2* 4.5  CL 104  --  106 107 108 110  CO2 25  --  25 25 23 23   GLUCOSE 93  --  93 102* 105* 100*  BUN 20  --  13 8 7* 7*  CREATININE 1.06* 1.08* 0.99 0.98 0.87 0.85  CALCIUM 8.4*  --  8.3* 7.9* 8.0* 8.2*  MG 1.8  --   --   --  1.4*  --    GFR: Estimated Creatinine Clearance: 32.2 mL/min (by C-G formula based on SCr of 0.85 mg/dL). Liver Function Tests: Recent Labs  Lab 07/30/18 0442  AST 19  ALT 10  ALKPHOS 56  BILITOT 0.6  PROT 5.4*  ALBUMIN 2.4*   No results for input(s): LIPASE, AMYLASE in the last 168 hours. No results for input(s): AMMONIA in the last 168 hours. Coagulation Profile: Recent Labs  Lab 07/24/18 2037  INR 1.08   Cardiac Enzymes: No results for input(s): CKTOTAL, CKMB, CKMBINDEX, TROPONINI in the last 168 hours. BNP (last 3 results) No results for input(s): PROBNP in the last 8760 hours. HbA1C: No results for input(s): HGBA1C in the last 72 hours. CBG: No results for input(s): GLUCAP in the last 168 hours. Lipid Profile: No results for input(s): CHOL, HDL, LDLCALC, TRIG, CHOLHDL, LDLDIRECT in the last 72 hours. Thyroid Function Tests: No results for input(s): TSH, T4TOTAL, FREET4, T3FREE, THYROIDAB in the last 72 hours. Anemia Panel: No results for input(s): VITAMINB12, FOLATE, FERRITIN, TIBC, IRON, RETICCTPCT in the last 72 hours. Sepsis Labs: Recent Labs  Lab 07/24/18 2046 07/25/18 0347  PROCALCITON  --  <0.10  LATICACIDVEN 1.22  --     Recent Results (from the past 240 hour(s))  Culture, blood (x 2)     Status: None   Collection Time: 07/24/18   9:28 PM  Result Value Ref Range Status   Specimen Description   Final    BLOOD RIGHT WRIST Performed at [facility] [address], [place], [place] [address]    Special Requests   Final    BOTTLES DRAWN AEROBIC AND ANAEROBIC Blood Culture adequate volume Performed at [facility] [address], [place], [place] [address]    Culture   Final    NO GROWTH 5 DAYS Performed at [facility], [place] [address], [place], [place] [address]    Report Status 07/30/2018  FINAL  Final  Culture, blood (x 2)     Status: None   Collection Time: 07/24/18  9:28 PM  Result Value Ref Range Status   Specimen Description   Final    BLOOD RIGHT WRIST Performed at [facility] [address] [place], [place] [address]    Special Requests   Final    BOTTLES DRAWN AEROBIC AND ANAEROBIC Blood Culture adequate volume Performed at [facility] [address] [place] [address] [place], [place] [address]    Culture   Final    NO GROWTH 5 DAYS Performed at [facility], [place] [address], [place], [place] [address]    Report Status 07/30/2018 FINAL  Final  Culture, blood (Routine X 2) w Reflex to ID Panel     Status: None (Preliminary result)   Collection Time: 07/28/18  3:10 PM  Result Value Ref Range Status   Specimen Description   Final    BLOOD LEFT ANTECUBITAL Performed at [facility] [address], [place], [place] [address]    Special Requests   Final    BOTTLES DRAWN AEROBIC ONLY Blood Culture adequate volume Performed at [facility] [address], [place], [place] [address]    Culture   Final    NO GROWTH 3 DAYS Performed at [facility], [place] [address] [address], [place], [place] [address]    Report Status PENDING  Incomplete  Culture, blood (Routine X 2) w Reflex to ID Panel     Status: None (Preliminary result)   Collection Time: 07/28/18  3:12 PM  Result Value Ref Range Status    Specimen Description   Final    BLOOD LEFT HAND Performed at [name] [address], [place], [place] [address]    Special Requests   Final    BOTTLES DRAWN AEROBIC ONLY Blood Culture adequate volume Performed at [facility] [address], [place], [place] [address]    Culture   Final    NO GROWTH 3 DAYS Performed at [facility], [place] [address] [place], [place] [address]    Report Status PENDING  Incomplete  C difficile quick scan w PCR reflex     Status: None   Collection Time: 07/30/18 11:03 AM  Result Value Ref Range Status   C Diff antigen NEGATIVE NEGATIVE Final   C Diff toxin NEGATIVE NEGATIVE Final   C Diff interpretation No C. difficile detected.  Final    Comment: Performed at [facility], [place] [address], [place], [place] [address]     Radiology Studies: Ct Abdomen Pelvis W Contrast  Result Date: 07/31/2018 CLINICAL DATA:  Abdominal pain, fever, question abscess, recently diagnosed with colitis versus diverticulitis of the sigmoid colon EXAM: CT ABDOMEN AND PELVIS WITH CONTRAST TECHNIQUE: Multidetector CT imaging of the abdomen and pelvis was performed using the standard protocol following bolus administration of intravenous contrast. Sagittal and coronal MPR images reconstructed from axial data set. CONTRAST:  171mL OMNIPAQUE IOHEXOL 300 MG/ML SOLN IV. Dilute oral contrast. COMPARISON:  07/24/2018 FINDINGS: Lower chest: Enlargement of cardiac chambers. Small bibasilar pleural effusions and minimal atelectasis. Hepatobiliary: Mild fatty infiltration of liver. Distended gallbladder without definite calcification Pancreas: Unremarkable Spleen: Small splenic cyst inferiorly. Additional tiny low-attenuation focus question additional cyst centrally in spleen. Adrenals/Urinary Tract: Adrenal glands unremarkable. Age-related renal cortical atrophy. No definite renal mass or hydronephrosis. Unremarkable ureters  and bladder. Stomach/Bowel: Scattered colonic diverticulosis. Chronic  wall thickening of the sigmoid colon with narrowing of lumen and surrounding inflammatory changes question long segment of diverticulitis versus colitis. Small extraluminal gas collection between sigmoid colon and LEFT ovary. No additional defined abscess collections. Edema of sigmoid mesocolon. Remaining bowel loops unremarkable. Large hiatal hernia. Vascular/Lymphatic: Atherosclerotic calcifications aorta common iliac arteries and coronary arteries. Aorta normal caliber. No adenopathy. Reproductive: Low-attenuation lesion at the LEFT lateral aspect of the lower uterus question leiomyoma versus small extra uterine collection unchanged 2.0 x 2.0 cm. Otherwise unremarkable uterus and RIGHT adnexa. Multiloculated cystic LEFT ovarian mass 5.5 x 3.7 cm image 55. Other: Increased presacral edema. Increased edema in sigmoid mesocolon. No additional abscess collections. Umbilical hernia containing fat. BILATERAL inguinal hernias containing fat. No free air. Minimal free fluid. Musculoskeletal: Osseous demineralization with scattered degenerative changes of the lumbar spine. IMPRESSION: 2.0 cm diameter leiomyoma versus small adjacent extra uterine collection at the LEFT lateral aspect of uterus, tiny abscess not excluded. Persistent inflammatory changes of the sigmoid colon with wall thickening, luminal narrowing, and extensive infiltration of the sigmoid mesocolon either representing diverticulitis or colitis with pericolic inflammation. Small foci of extraluminal gas are identified without additional drainable abscess collection. Multiloculated cystic neoplasm of the LEFT ovary, could be malignant or benign. Umbilical and BILATERAL inguinal hernias containing fat. Bibasilar pleural effusions and minimal atelectasis. Electronically Signed   By: Lavonia Dana M.D.   On: 07/31/2018 14:46     Scheduled Meds: . acetaminophen  1,000 mg Oral Q8H  .  ALPRAZolam  0.25 mg Oral QHS  . aspirin  81 mg Oral Daily  . enoxaparin (LOVENOX) injection  30 mg Subcutaneous QHS  . gabapentin  100 mg Oral QHS  . irbesartan  150 mg Oral Daily  . levothyroxine  75 mcg Oral QAC breakfast  . olopatadine  1 drop Both Eyes BID  . pantoprazole  40 mg Oral Daily  . protein supplement shake  11 oz Oral Q24H  . saccharomyces boulardii  250 mg Oral BID  . sodium chloride      . sodium chloride flush  3 mL Intravenous Q12H   Continuous Infusions: . [START ON 08/01/2018] dextrose 5 % and 0.9 % NaCl with KCl 20 mEq/L    . dextrose 5 % and 0.9 % NaCl with KCl 40 mEq/L 75 mL/hr at 07/31/18 1254  . meropenem (MERREM) IV 1 g (07/31/18 1253)     LOS: 7 days    Time spent: Total of 25 minutes spent with pt, greater than 50% of which was spent in discussion of  treatment, counseling and coordination of care   Chipper Oman, MD Pager: Text Page via [email or web address]   If 7PM-7AM, please contact night-coverage [email or web address] 07/31/2018, 5:16 PM   Note - This record has been created using [employer]. Chart creation errors have been sought, but may not always have been located. Such creation errors do not reflect on the standard of medical care.

## 2018-08-01 LAB — CBC WITH DIFFERENTIAL/PLATELET
Basophils Absolute: 0.1 10*3/uL (ref 0.0–0.1)
Basophils Relative: 1 %
Eosinophils Absolute: 0.5 10*3/uL (ref 0.0–0.7)
Eosinophils Relative: 5 %
HCT: 33.4 % — ABNORMAL LOW (ref 36.0–46.0)
Hemoglobin: 10.5 g/dL — ABNORMAL LOW (ref 12.0–15.0)
Lymphocytes Relative: 23 %
Lymphs Abs: 2.2 10*3/uL (ref 0.7–4.0)
MCH: 27.1 pg (ref 26.0–34.0)
MCHC: 31.4 g/dL (ref 30.0–36.0)
MCV: 86.1 fL (ref 78.0–100.0)
Monocytes Absolute: 0.7 10*3/uL (ref 0.1–1.0)
Monocytes Relative: 7 %
Neutro Abs: 6.5 10*3/uL (ref 1.7–7.7)
Neutrophils Relative %: 66 %
Platelets: 462 10*3/uL — ABNORMAL HIGH (ref 150–400)
RBC: 3.88 MIL/uL (ref 3.87–5.11)
RDW: 16.1 % — ABNORMAL HIGH (ref 11.5–15.5)
WBC: 10 10*3/uL (ref 4.0–10.5)

## 2018-08-01 LAB — BASIC METABOLIC PANEL
Anion gap: 7 (ref 5–15)
BUN: 6 mg/dL — ABNORMAL LOW (ref 8–23)
CO2: 22 mmol/L (ref 22–32)
Calcium: 8.4 mg/dL — ABNORMAL LOW (ref 8.9–10.3)
Chloride: 109 mmol/L (ref 98–111)
Creatinine, Ser: 0.85 mg/dL (ref 0.44–1.00)
GFR calc Af Amer: >60 mL/min (ref >60)
GFR calc non Af Amer: 59 mL/min — ABNORMAL LOW (ref >60)
Glucose, Bld: 83 mg/dL (ref 70–99)
Potassium: 4.5 mmol/L (ref 3.5–5.1)
Sodium: 138 mmol/L (ref 135–145)

## 2018-08-01 MED ORDER — PHENOL 1.4 % MT LIQD
1.0000 | OROMUCOSAL | Status: DC | PRN
  Administered 2018-08-01: 1 via OROMUCOSAL
  Filled 2018-08-01: qty 177

## 2018-08-01 MED ORDER — METOPROLOL TARTRATE 5 MG/5ML IV SOLN
5.0000 mg | Freq: Once | INTRAVENOUS | Status: AC
  Administered 2018-08-01: 5 mg via INTRAVENOUS
  Filled 2018-08-01: qty 5

## 2018-08-01 NOTE — Progress Notes (Addendum)
Pharmacy Antibiotic Note  [name] is a 84 y.o. female admitted on 07/24/2018 with perforated diverticulitis. Patient initially placed on Meropenem, then transitioned to Ceftriaxone and Metronidazole on 9/27, as patient was improving. Antibiotics transitioned back to Meropenem today, as having low grade fevers, WBC increased, ongoing pain.  Pharmacy has been consulted for Meropenem dosing.  Today, 08/01/2018:  D9 abx  WBC normalized  SCr stable WNL  No additional fevers since 9/29  Seen by surgery, felt stable for discharge tomorrow if tolerating soft diet  Likely will need extended course of abx    Plan:  Continue Meropenem 1g IV q12h for now  Patient intolerant of PCNs, including Augmentin. Also appears to have worsened on Rocephin/Flagyl. Would prefer to avoid quinolones in this 84 yo patient with low baseline CrCl, although short course (3-5) days of Cipro may be appropriate if cephalosporins felt to be unreliable. Otherwise would prefer Vantin 400 mg BID with close f/u for worsening of infection.  Pharmacy will sign off of meropenem dosing, following for culture results and changes in renal function   Height: 5' (152.4 cm) Weight: 111 lb 12.8 oz (50.7 kg) IBW/kg (Calculated) : 45.5  Temp (24hrs), Avg:98.4 F (36.9 C), Min:98.2 F (36.8 C), Max:98.7 F (37.1 C)  Recent Labs  Lab 07/27/18 0420 07/29/18 0423 07/30/18 0442 07/31/18 0438 08/01/18 0503  WBC 8.1 12.5* 10.6* 9.8 10.0  CREATININE 0.99 0.98 0.87 0.85 0.85    Estimated Creatinine Clearance: 32.2 mL/min (by C-G formula based on SCr of 0.85 mg/dL).    Allergies  Allergen Reactions  . Infliximab Other (See Comments)    REACTION: "Enlarged intestines and hernia. Also pushed intestines on lower part of lungs." REACTION: "Enlarged intestines and hernia. Also pushed intestines on lower part of lungs."  . Aspirin Nausea And Vomiting    Low Dose is ok Stomach problems  . Carvedilol Other (See  Comments)    Fatigue/ ill Fatigue/ ill  . Codeine Nausea And Vomiting    "Deathly Sick"  . Penicillins Rash    Has patient had a PCN reaction causing immediate rash, facial/tongue/throat swelling, SOB or lightheadedness with hypotension: Y Has patient had a PCN reaction causing severe rash involving mucus membranes or skin necrosis: Y Has patient had a PCN reaction that required hospitalization: N Has patient had a PCN reaction occurring within the last 10 years: N If all of the above answers are "NO", then may proceed with Cephalosporin use.     Antimicrobials this admission: 9/25 Meropenem >> 9/27, resumed 9/30 >> 9/27 Ceftriaxone >> 9/30 9/27 Metronidazole >> 9/30  Dose adjustments this admission:  Microbiology results: 9/25 BCx: NGF 9/29 BCx: NGTD 10/1 C.diff PCR: negative    Reuel Boom, PharmD, BCPS [phone] 08/01/2018, 12:35 PM

## 2018-08-01 NOTE — Progress Notes (Signed)
PROGRESS NOTE [facility]   [name]   YIR:[id number] DOB: [birth year]  DOA: 07/24/2018 PCP: [name], NP   Brief Narrative:  [name] is a 84 year old female with medical history significant for hypertension, hyperlipidemia, A. fib not on anticoagulation, ACS, hypothyroidism and rheumatoid arthritis presented to the emergency department complaining of abdominal pain.  Upon ED evaluation CT scan showed inflammation of the sigmoid colon with signs of microperforation and pattern of diverticulitis.  Patient was admitted with working diagnosis of acute diverticulitis and surgery was consulted.  Subjective: Patient seen and examined, abdominal pain improving. No acute events overnight. Remains afebrile. Tolerating diet well. Still having some diarrhea, non bloody.   Assessment & Plan: Acute sigmoid diverticulitis General surgery consulted and recommendations appreciated. Repeated CT abdomen 10/2 shows persistent inflammatory/infectious changes.  No drainable abscess. Keep soft diet for now.  She will need a repeat CT scan in 2 to 3 weeks. Continue Merrem for now, may change abx therapy to oral in am if continues to improves. She will need at least 21 days total of abx therapy.    AKI - resolved  From dehydration, Cr improved after IV hydration. Avoid nephrotoxic agents and hypotension. Encourage oral hydration.   Chronic A. fib Rate well controlled, not on any anticoagulation.  Anemia of chronic disease Hemoglobin stable, no signs of overt bleeding.  Trend CBC  Left ovarian cyst/mass Incidental finding on CT showing 4.9 x 3.4 cm.  Patient will need outpatient MRIs of the pelvis to evaluate for ovarian malignancy.  OB/GYN follow-up of discharge.  DVT prophylaxis: Lovenox Code Status: Full code Family Communication: None at bedside Disposition Plan: Home in 1-2 days if remains stable   Consultants:   Gen Surgery   Procedures:   None    Antimicrobials: Anti-infectives (From admission, onward)   Start     Dose/Rate Route Frequency Ordered Stop   07/30/18 0000  meropenem (MERREM) 1 g in sodium chloride 0.9 % 100 mL IVPB     1 g 200 mL/hr over 30 Minutes Intravenous Every 12 hours 07/29/18 1157     07/29/18 1100  meropenem (MERREM) 1 g in sodium chloride 0.9 % 100 mL IVPB     1 g 200 mL/hr over 30 Minutes Intravenous  Once 07/29/18 1032 07/29/18 1248   07/26/18 2200  metroNIDAZOLE (FLAGYL) IVPB 500 mg  Status:  Discontinued     500 mg 100 mL/hr over 60 Minutes Intravenous Every 8 hours 07/26/18 1508 07/29/18 1031   07/26/18 2100  cefTRIAXone (ROCEPHIN) 2 g in sodium chloride 0.9 % 100 mL IVPB  Status:  Discontinued     2 g 200 mL/hr over 30 Minutes Intravenous Every 24 hours 07/26/18 1508 07/29/18 1031   07/25/18 1000  meropenem (MERREM) 1 g in sodium chloride 0.9 % 100 mL IVPB  Status:  Discontinued     1 g 200 mL/hr over 30 Minutes Intravenous Every 12 hours 07/24/18 2200 07/26/18 1508   07/24/18 2000  meropenem (MERREM) 1 g in sodium chloride 0.9 % 100 mL IVPB     1 g 200 mL/hr over 30 Minutes Intravenous  Once 07/24/18 1940 07/24/18 2156         Objective: Vitals:   08/01/18 0456 08/01/18 0743 08/01/18 1403 08/01/18 1433  BP: (!) 146/104 (!) 154/93 (!) 159/97 140/87  Pulse: (!) 102 61 (!) 133 100  Resp: 16 16 (!) 28 20  Temp: 98.4 F (36.9 C) 98.2 F (36.8 C) 98.4 F (  36.9 C)   TempSrc: Oral Oral Oral   SpO2: 95% 99% 98% 100%  Weight:      Height:        Intake/Output Summary (Last 24 hours) at 08/01/2018 1658 Last data filed at 08/01/2018 1600 Gross per 24 hour  Intake 1085.92 ml  Output -  Net 1085.92 ml   Filed Weights   07/25/18 0904  Weight: 50.7 kg    Examination:  General: Pt is alert, awake, not in acute distress Cardiovascular: RRR, S1/S2 +, no rubs, no gallops Respiratory: CTA bilaterally, no wheezing, no rhonchi Abdominal: Soft, NT, ND, bowel sounds + Extremities: no edema,  no cyanosis  Data Reviewed: I have personally reviewed following labs and imaging studies  CBC: Recent Labs  Lab 07/27/18 0420 07/29/18 0423 07/30/18 0442 07/31/18 0438 08/01/18 0503  WBC 8.1 12.5* 10.6* 9.8 10.0  NEUTROABS  --   --   --   --  6.5  HGB 10.2* 9.6* 9.4* 9.9* 10.5*  HCT 32.2* 30.9* 29.5* 30.8* 33.4*  MCV 85.2 86.1 85.8 85.8 86.1  PLT 396 387 331 387 624*   Basic Metabolic Panel: Recent Labs  Lab 07/27/18 0420 07/29/18 0423 07/30/18 0442 07/31/18 0438 08/01/18 0503  NA 138 138 139 141 138  K 4.3 3.7 3.2* 4.5 4.5  CL 106 107 108 110 109  CO2 25 25 23 23 22   GLUCOSE 93 102* 105* 100* 83  BUN 13 8 7* 7* 6*  CREATININE 0.99 0.98 0.87 0.85 0.85  CALCIUM 8.3* 7.9* 8.0* 8.2* 8.4*  MG  --   --  1.4*  --   --    GFR: Estimated Creatinine Clearance: 32.2 mL/min (by C-G formula based on SCr of 0.85 mg/dL). Liver Function Tests: Recent Labs  Lab 07/30/18 0442  AST 19  ALT 10  ALKPHOS 56  BILITOT 0.6  PROT 5.4*  ALBUMIN 2.4*   No results for input(s): LIPASE, AMYLASE in the last 168 hours. No results for input(s): AMMONIA in the last 168 hours. Coagulation Profile: No results for input(s): INR, PROTIME in the last 168 hours. Cardiac Enzymes: No results for input(s): CKTOTAL, CKMB, CKMBINDEX, TROPONINI in the last 168 hours. BNP (last 3 results) No results for input(s): PROBNP in the last 8760 hours. HbA1C: No results for input(s): HGBA1C in the last 72 hours. CBG: No results for input(s): GLUCAP in the last 168 hours. Lipid Profile: No results for input(s): CHOL, HDL, LDLCALC, TRIG, CHOLHDL, LDLDIRECT in the last 72 hours. Thyroid Function Tests: No results for input(s): TSH, T4TOTAL, FREET4, T3FREE, THYROIDAB in the last 72 hours. Anemia Panel: No results for input(s): VITAMINB12, FOLATE, FERRITIN, TIBC, IRON, RETICCTPCT in the last 72 hours. Sepsis Labs: No results for input(s): PROCALCITON, LATICACIDVEN in the last 168 hours.  Recent Results  (from the past 240 hour(s))  Culture, blood (x 2)     Status: None   Collection Time: 07/24/18  9:28 PM  Result Value Ref Range Status   Specimen Description   Final    BLOOD RIGHT WRIST Performed at [facility] [address], [place], [place] [address]    Special Requests   Final    BOTTLES DRAWN AEROBIC AND ANAEROBIC Blood Culture adequate volume Performed at [facility] [address], [place], [place] [address]    Culture   Final    NO GROWTH 5 DAYS Performed at [facility], [place] [address] [place], [place] [address]    Report  Status 07/30/2018 FINAL  Final  Culture, blood (x 2)     Status: None   Collection Time: 07/24/18  9:28 PM  Result Value Ref Range Status   Specimen Description   Final    BLOOD RIGHT WRIST Performed at [facility] [address], [place], [place] [address]    Special Requests   Final    BOTTLES DRAWN AEROBIC AND ANAEROBIC Blood Culture adequate volume Performed at [facility] [address], [place], [place] [address]    Culture   Final    NO GROWTH 5 DAYS Performed at [facility], [place] [address] [place], [place] [address]    Report Status 07/30/2018 FINAL  Final  Culture, blood (Routine X 2) w Reflex to ID Panel     Status: None (Preliminary result)   Collection Time: 07/28/18  3:10 PM  Result Value Ref Range Status   Specimen Description   Final    BLOOD LEFT ANTECUBITAL Performed at [facility] [address] [place], [place] [address]    Special Requests   Final    BOTTLES DRAWN AEROBIC ONLY Blood Culture adequate volume Performed at [facility] [address], [place], [place] [address]    Culture   Final    NO GROWTH 4 DAYS Performed at [facility], [place] [address], [place], [place] [address]    Report Status PENDING  Incomplete  Culture, blood (Routine X 2) w Reflex to ID Panel      Status: None (Preliminary result)   Collection Time: 07/28/18  3:12 PM  Result Value Ref Range Status   Specimen Description   Final    BLOOD LEFT HAND Performed at [facility] [address] [place], [place] [address]    Special Requests   Final    BOTTLES DRAWN AEROBIC ONLY Blood Culture adequate volume Performed at [facility] [address] [place], [place] [address]    Culture   Final    NO GROWTH 4 DAYS Performed at [facility], [place] [address], [place], [place] [address]    Report Status PENDING  Incomplete  C difficile quick scan w PCR reflex     Status: None   Collection Time: 07/30/18 11:03 AM  Result Value Ref Range Status   C Diff antigen NEGATIVE NEGATIVE Final   C Diff toxin NEGATIVE NEGATIVE Final   C Diff interpretation No C. difficile detected.  Final    Comment: Performed at [facility], [place] [address], [place],  [address]     Radiology Studies: Ct Abdomen Pelvis W Contrast  Result Date: 07/31/2018 CLINICAL DATA:  Abdominal pain, fever, question abscess, recently diagnosed with colitis versus diverticulitis of the sigmoid colon EXAM: CT ABDOMEN AND PELVIS WITH CONTRAST TECHNIQUE: Multidetector CT imaging of the abdomen and pelvis was performed using the standard protocol following bolus administration of intravenous contrast. Sagittal and coronal MPR images reconstructed from axial data set. CONTRAST:  122mL OMNIPAQUE IOHEXOL 300 MG/ML SOLN IV. Dilute oral contrast. COMPARISON:  07/24/2018 FINDINGS: Lower chest: Enlargement of cardiac chambers. Small bibasilar pleural effusions and minimal atelectasis. Hepatobiliary: Mild fatty infiltration of liver. Distended gallbladder without definite calcification Pancreas: Unremarkable Spleen: Small splenic cyst inferiorly. Additional tiny low-attenuation focus question additional cyst centrally in spleen. Adrenals/Urinary Tract: Adrenal glands  unremarkable. Age-related renal cortical atrophy. No definite renal mass or hydronephrosis. Unremarkable ureters and bladder. Stomach/Bowel: Scattered colonic  diverticulosis. Chronic wall thickening of the sigmoid colon with narrowing of lumen and surrounding inflammatory changes question long segment of diverticulitis versus colitis. Small extraluminal gas collection between sigmoid colon and LEFT ovary. No additional defined abscess collections. Edema of sigmoid mesocolon. Remaining bowel loops unremarkable. Large hiatal hernia. Vascular/Lymphatic: Atherosclerotic calcifications aorta common iliac arteries and coronary arteries. Aorta normal caliber. No adenopathy. Reproductive: Low-attenuation lesion at the LEFT lateral aspect of the lower uterus question leiomyoma versus small extra uterine collection unchanged 2.0 x 2.0 cm. Otherwise unremarkable uterus and RIGHT adnexa. Multiloculated cystic LEFT ovarian mass 5.5 x 3.7 cm image 55. Other: Increased presacral edema. Increased edema in sigmoid mesocolon. No additional abscess collections. Umbilical hernia containing fat. BILATERAL inguinal hernias containing fat. No free air. Minimal free fluid. Musculoskeletal: Osseous demineralization with scattered degenerative changes of the lumbar spine. IMPRESSION: 2.0 cm diameter leiomyoma versus small adjacent extra uterine collection at the LEFT lateral aspect of uterus, tiny abscess not excluded. Persistent inflammatory changes of the sigmoid colon with wall thickening, luminal narrowing, and extensive infiltration of the sigmoid mesocolon either representing diverticulitis or colitis with pericolic inflammation. Small foci of extraluminal gas are identified without additional drainable abscess collection. Multiloculated cystic neoplasm of the LEFT ovary, could be malignant or benign. Umbilical and BILATERAL inguinal hernias containing fat. Bibasilar pleural effusions and minimal atelectasis. Electronically Signed    By: Lavonia Dana M.D.   On: 07/31/2018 14:46     Scheduled Meds: . acetaminophen  1,000 mg Oral Q8H  . ALPRAZolam  0.25 mg Oral QHS  . aspirin  81 mg Oral Daily  . enoxaparin (LOVENOX) injection  30 mg Subcutaneous QHS  . gabapentin  100 mg Oral QHS  . irbesartan  150 mg Oral Daily  . levothyroxine  75 mcg Oral QAC breakfast  . olopatadine  1 drop Both Eyes BID  . pantoprazole  40 mg Oral Daily  . protein supplement shake  11 oz Oral Q24H  . saccharomyces boulardii  250 mg Oral BID  . sodium chloride flush  3 mL Intravenous Q12H   Continuous Infusions: . sodium chloride 10 mL/hr at 08/01/18 0600  . meropenem (MERREM) IV 1 g (08/01/18 1151)     LOS: 8 days    Time spent: Total of 25 minutes spent with pt, greater than 50% of which was spent in discussion of  treatment, counseling and coordination of care   Chipper Oman, MD Pager: Text Page via [email or web address]   If 7PM-7AM, please contact night-coverage [email or web address] 08/01/2018, 4:58 PM   Note - This record has been created using [employer]. Chart creation errors have been sought, but may not always have been located. Such creation errors do not reflect on the standard of medical care.

## 2018-08-01 NOTE — Progress Notes (Signed)
Physical Therapy Treatment Patient Details Name: [name] MRN: [id number] DOB: 1927-10-13 Today's Date: 08/01/2018    History of Present Illness 84 yo female admitted with sepsis, perforated divertculitis. hx of A fib, aortic stenosis, OA, RA, neuropathy    PT Comments    Pt with improved ambulation distance today with no complaints of dyspnea. Pt still utilizing IV pole for steadying, but is able to ambulate without UE support. Pt also requiring only mod I to supervision for mobility, progressing well at this time. PT to continue to follow and progress mobility as able.    Follow Up Recommendations  No PT follow up;Supervision - Intermittent     Equipment Recommendations  None recommended by PT    Recommendations for Other Services       Precautions / Restrictions Precautions Precautions: Fall Restrictions Weight Bearing Restrictions: No    Mobility  Bed Mobility Overal bed mobility: Modified Independent             General bed mobility comments: Increased time and effort   Transfers Overall transfer level: Needs assistance Equipment used: None Transfers: Sit to/from Stand Sit to Stand: Supervision         General transfer comment: for safety, increased time and effort. Reaching for IV pole after standing, stating she felt weak because she hasn't eaten well over the past few days.   Ambulation/Gait Ambulation/Gait assistance: Min guard Gait Distance (Feet): 200 Feet Assistive device: IV Pole Gait Pattern/deviations: Step-through pattern;Decreased stride length Gait velocity: decr    General Gait Details: Min guard for safety. Pt ambulated first 150 ft with use of IV pole, pt ambulated last 50 ft without UE support and ambulated slower without. Pt reports having son to hold her hand with ambulation typically. No reports of dyspnea this session.    Stairs             Wheelchair Mobility    Modified Rankin (Stroke Patients Only)        Balance Overall balance assessment: Mild deficits observed, not formally tested                                          Cognition Arousal/Alertness: Awake/alert Behavior During Therapy: WFL for tasks assessed/performed Overall Cognitive Status: Within Functional Limits for tasks assessed                                        Exercises      General Comments        Pertinent Vitals/Pain Pain Assessment: No/denies pain    Home Living                      Prior Function            PT Goals (current goals can now be found in the care plan section) Acute Rehab PT Goals Patient Stated Goal: to get better. home soon PT Goal Formulation: With patient Time For Goal Achievement: 08/13/18 Potential to Achieve Goals: Good Progress towards PT goals: Progressing toward goals    Frequency    Min 3X/week      PT Plan Current plan remains appropriate    Co-evaluation              AM-PAC PT "6 Clicks" Daily  Activity  Outcome Measure  Difficulty turning over in bed (including adjusting bedclothes, sheets and blankets)?: None Difficulty moving from lying on back to sitting on the side of the bed? : None Difficulty sitting down on and standing up from a chair with arms (e.g., wheelchair, bedside commode, etc,.)?: A Little Help needed moving to and from a bed to chair (including a wheelchair)?: A Little Help needed walking in hospital room?: A Little Help needed climbing 3-5 steps with a railing? : A Little 6 Click Score: 20    End of Session Equipment Utilized During Treatment: (pt declines gait belt ) Activity Tolerance: Patient tolerated treatment well Patient left: in bed;with call bell/phone within reach(pt sitting EOB to answer phone call, nurse tech notified that pt stated she could get back to bed when she was ready. NT agrees to check on pt in a few minutes. )   PT Visit Diagnosis: Unsteadiness on feet  (R26.81)     Time: 1610-9604 PT Time Calculation (min) (ACUTE ONLY): 18 min  Charges:  $Gait Training: 8-22 mins                     Julien Girt, PT Acute Rehabilitation Services Pager [phone]  Office [phone]    Chidi Shirer D Shacara Cozine 08/01/2018, 2:09 PM

## 2018-08-01 NOTE — Progress Notes (Signed)
[facility]/Trauma Progress Note      Assessment/Plan Hypertension A fib - not on anticoagulation Mild CKD AS Hypothyroid OA/RA Hx of IBS Neuropathic pain Hyperlipidemia Severe malnutrition Hypokalemia - resolved  Perforated Sigmoid Diverticulitis  FEN: IV fluids/soft diet ID: Meropenem 9/25 >9/27; 9/30 >>day 3 Rocephin 9/27 >>9/30 Flagyl 9/27 >>9/30  WBC 10.0, afebrile DVT: Lovenox Follow up: TBD  Plan:  Continue current RX, pt is improving. She has not eaten much. If she tolerates a soft diet today with no increase in pain or WBC overnight then she may be ready for discharge tomorrow on PO antibiotics.     LOS: 8 days    Subjective: CC: diarrhea  Pt states she was having diarrhea yesterday. This am was a more formed stool. No issues overnight. No nausea, vomiting, fever or chills. Abdominal pain improved and no complaints of pain this am. No family at bedside.   Objective: Vital signs in last 24 hours: Temp:  [98.2 F (36.8 C)-98.7 F (37.1 C)] 98.2 F (36.8 C) (10/03 0743) Pulse Rate:  [61-123] 61 (10/03 0743) Resp:  [16-18] 16 (10/03 0743) BP: (130-160)/(93-116) 154/93 (10/03 0743) SpO2:  [95 %-100 %] 99 % (10/03 0743) Last BM Date: 07/31/18  Intake/Output from previous day: 10/02 0701 - 10/03 0700 In: 585.9 [P.O.:240; I.V.:45.9; IV Piggyback:300] Out: -  Intake/Output this shift: No intake/output data recorded.  PE: Gen:  Alert, NAD, pleasant, cooperative Pulm:  Rate and effort normal Abd: Soft, ND, +BS, no HSM, mild TTP of LLQ without guarding. No peritonitis.  Skin: no rashes noted, warm and dry   Anti-infectives: Anti-infectives (From admission, onward)   Start     Dose/Rate Route Frequency Ordered Stop   07/30/18 0000  meropenem (MERREM) 1 g in sodium chloride 0.9 % 100 mL IVPB     1 g 200 mL/hr over 30 Minutes Intravenous Every 12 hours 07/29/18 1157     07/29/18 1100  meropenem (MERREM) 1 g in  sodium chloride 0.9 % 100 mL IVPB     1 g 200 mL/hr over 30 Minutes Intravenous  Once 07/29/18 1032 07/29/18 1248   07/26/18 2200  metroNIDAZOLE (FLAGYL) IVPB 500 mg  Status:  Discontinued     500 mg 100 mL/hr over 60 Minutes Intravenous Every 8 hours 07/26/18 1508 07/29/18 1031   07/26/18 2100  cefTRIAXone (ROCEPHIN) 2 g in sodium chloride 0.9 % 100 mL IVPB  Status:  Discontinued     2 g 200 mL/hr over 30 Minutes Intravenous Every 24 hours 07/26/18 1508 07/29/18 1031   07/25/18 1000  meropenem (MERREM) 1 g in sodium chloride 0.9 % 100 mL IVPB  Status:  Discontinued     1 g 200 mL/hr over 30 Minutes Intravenous Every 12 hours 07/24/18 2200 07/26/18 1508   07/24/18 2000  meropenem (MERREM) 1 g in sodium chloride 0.9 % 100 mL IVPB     1 g 200 mL/hr over 30 Minutes Intravenous  Once 07/24/18 1940 07/24/18 2156      Lab Results:  Recent Labs    07/31/18 0438 08/01/18 0503  WBC 9.8 10.0  HGB 9.9* 10.5*  HCT 30.8* 33.4*  PLT 387 462*   BMET Recent Labs    07/31/18 0438 08/01/18 0503  NA 141 138  K 4.5 4.5  CL 110 109  CO2 23 22  GLUCOSE 100* 83  BUN 7* 6*  CREATININE 0.85 0.85  CALCIUM 8.2* 8.4*   PT/INR No results for input(s): LABPROT, INR in the  last 72 hours. CMP     Component Value Date/Time   NA 138 08/01/2018 0503   K 4.5 08/01/2018 0503   CL 109 08/01/2018 0503   CO2 22 08/01/2018 0503   GLUCOSE 83 08/01/2018 0503   BUN 6 (L) 08/01/2018 0503   CREATININE 0.85 08/01/2018 0503   CALCIUM 8.4 (L) 08/01/2018 0503   PROT 5.4 (L) 07/30/2018 0442   ALBUMIN 2.4 (L) 07/30/2018 0442   AST 19 07/30/2018 0442   ALT 10 07/30/2018 0442   ALKPHOS 56 07/30/2018 0442   BILITOT 0.6 07/30/2018 0442   GFRNONAA 59 (L) 08/01/2018 0503   GFRAA >60 08/01/2018 0503   Lipase     Component Value Date/Time   LIPASE 45 07/24/2018 1656    Studies/Results: Ct Abdomen Pelvis W Contrast  Result Date: 07/31/2018 CLINICAL DATA:  Abdominal pain, fever, question abscess, recently  diagnosed with colitis versus diverticulitis of the sigmoid colon EXAM: CT ABDOMEN AND PELVIS WITH CONTRAST TECHNIQUE: Multidetector CT imaging of the abdomen and pelvis was performed using the standard protocol following bolus administration of intravenous contrast. Sagittal and coronal MPR images reconstructed from axial data set. CONTRAST:  134mL OMNIPAQUE IOHEXOL 300 MG/ML SOLN IV. Dilute oral contrast. COMPARISON:  07/24/2018 FINDINGS: Lower chest: Enlargement of cardiac chambers. Small bibasilar pleural effusions and minimal atelectasis. Hepatobiliary: Mild fatty infiltration of liver. Distended gallbladder without definite calcification Pancreas: Unremarkable Spleen: Small splenic cyst inferiorly. Additional tiny low-attenuation focus question additional cyst centrally in spleen. Adrenals/Urinary Tract: Adrenal glands unremarkable. Age-related renal cortical atrophy. No definite renal mass or hydronephrosis. Unremarkable ureters and bladder. Stomach/Bowel: Scattered colonic diverticulosis. Chronic wall thickening of the sigmoid colon with narrowing of lumen and surrounding inflammatory changes question long segment of diverticulitis versus colitis. Small extraluminal gas collection between sigmoid colon and LEFT ovary. No additional defined abscess collections. Edema of sigmoid mesocolon. Remaining bowel loops unremarkable. Large hiatal hernia. Vascular/Lymphatic: Atherosclerotic calcifications aorta common iliac arteries and coronary arteries. Aorta normal caliber. No adenopathy. Reproductive: Low-attenuation lesion at the LEFT lateral aspect of the lower uterus question leiomyoma versus small extra uterine collection unchanged 2.0 x 2.0 cm. Otherwise unremarkable uterus and RIGHT adnexa. Multiloculated cystic LEFT ovarian mass 5.5 x 3.7 cm image 55. Other: Increased presacral edema. Increased edema in sigmoid mesocolon. No additional abscess collections. Umbilical hernia containing fat. BILATERAL inguinal  hernias containing fat. No free air. Minimal free fluid. Musculoskeletal: Osseous demineralization with scattered degenerative changes of the lumbar spine. IMPRESSION: 2.0 cm diameter leiomyoma versus small adjacent extra uterine collection at the LEFT lateral aspect of uterus, tiny abscess not excluded. Persistent inflammatory changes of the sigmoid colon with wall thickening, luminal narrowing, and extensive infiltration of the sigmoid mesocolon either representing diverticulitis or colitis with pericolic inflammation. Small foci of extraluminal gas are identified without additional drainable abscess collection. Multiloculated cystic neoplasm of the LEFT ovary, could be malignant or benign. Umbilical and BILATERAL inguinal hernias containing fat. Bibasilar pleural effusions and minimal atelectasis. Electronically Signed   By: Lavonia Dana M.D.   On: 07/31/2018 14:46      Kalman Drape , [facility] 08/01/2018, 10:31 AM  Pager: [phone] Mon-Wed, Friday 7:00am-4:30pm Thurs 7am-11:30am  Consults: [phone]

## 2018-08-02 DIAGNOSIS — N182 Chronic kidney disease, stage 2 (mild): Secondary | ICD-10-CM

## 2018-08-02 LAB — CULTURE, BLOOD (ROUTINE X 2)
Culture: NO GROWTH
Culture: NO GROWTH
Special Requests: ADEQUATE
Special Requests: ADEQUATE

## 2018-08-02 MED ORDER — METRONIDAZOLE 500 MG PO TABS: 500.0000 mg | ORAL_TABLET | Freq: Three times a day (TID) | ORAL | 0 refills | Status: AC

## 2018-08-02 MED ORDER — SACCHAROMYCES BOULARDII 250 MG PO CAPS: 250.0000 mg | ORAL_CAPSULE | Freq: Two times a day (BID) | ORAL | 0 refills | Status: DC

## 2018-08-02 MED ORDER — TRAMADOL HCL 50 MG PO TABS: 50.0000 mg | ORAL_TABLET | Freq: Three times a day (TID) | ORAL | 0 refills | Status: DC | PRN

## 2018-08-02 MED ORDER — CIPROFLOXACIN HCL 250 MG PO TABS: 250.0000 mg | ORAL_TABLET | Freq: Two times a day (BID) | ORAL | 0 refills | Status: AC

## 2018-08-02 NOTE — Care Management Important Message (Signed)
Important Message  Patient Details  Name: Fiorela Pelzer MRN: [id number] Date of Birth: May 20, 1927   Medicare Important Message Given:  Yes    Kerin Salen 08/02/2018, 11:06 AMImportant Message  Patient Details  Name: Zykerria Tanton MRN: [id number] Date of Birth: Dec 22, 1926   Medicare Important Message Given:  Yes    Kerin Salen 08/02/2018, 11:05 AM

## 2018-08-02 NOTE — Discharge Summary (Signed)
Physician Discharge Summary  [name]  JKD:[id number]  DOB: 11/01/1926  DOA: 07/24/2018 PCP: [name], NP  Admit date: 07/24/2018 Discharge date: 08/02/2018  Admitted From: Home  Disposition:  Home   Recommendations for Outpatient Follow-up:  1. Follow up with PCP in 1 week  2. Please obtain BMP/CBC in one week monitor renal function and hemoglobin 3. Repeat CT scan of abdomen/pelvis and 2 to 3 weeks to assure resolution of abdominal findings.    Discharge Condition: Stable CODE STATUS: Full code Diet recommendation: Heart Healthy / Soft    Brief/Interim Summary: For full details see H&P/Progress note, but in brief, [name] is a  84 year old female with medical history significant for hypertension, hyperlipidemia, A. fib not on anticoagulation, ACS, hypothyroidism and rheumatoid arthritis presented to the emergency department complaining of abdominal pain.  Upon ED evaluation CT scan showed inflammation of the sigmoid colon with signs of microperforation and pattern of diverticulitis.  Patient was admitted with working diagnosis of acute diverticulitis and surgery was consulted.  Subjective: Patient seen and examined, has no complaints this morning.  Diarrhea has significantly improved.  Tolerating diet well.  Remains afebrile.  No acute events overnight.  Discharge Diagnoses/Hospital Course:  Acute sigmoid diverticulitis with perforation General surgery was consulted.  Repeated CT abdomen 10/2 shows persistent inflammatory/infectious changes.  No drainable abscess.  Conservative treatment with antibiotics.  Patient was treated with Merrem for 10 days.  Will discharge on Cipro and Flagyl for 11 days to complete 21 days of antibiotic therapy. She will need a repeat CT scan in 2 to 3 weeks.   AKI on CKD stage III - resolved  From dehydration, creatinine improved after IV fluids.  Will function upon discharge stable.  Encourage oral hydration.   Chronic  A. fib Patient rates remained well controlled during hospital stay.  Patient not on any anticoagulation  Anemia of chronic disease Hemoglobin stable, no signs of overt bleeding.  Left ovarian cyst/mass Incidental finding on CT showing 4.9 x 3.4 cm.  Patient will need outpatient MRIs of the pelvis to evaluate for ovarian malignancy.  OB/GYN follow-up of discharge.  All other chronic medical condition were stable during the hospitalization.  On the day of the discharge the patient's vitals were stable, and no other acute medical condition were reported by patient. the patient was felt safe to be discharge to home.   Discharge Instructions  You were cared for by a hospitalist during your hospital stay. If you have any questions about your discharge medications or the care you received while you were in the hospital after you are discharged, you can call the unit and asked to speak with the hospitalist on call if the hospitalist that took care of you is not available. Once you are discharged, your primary care physician will handle any further medical issues. Please note that NO REFILLS for any discharge medications will be authorized once you are discharged, as it is imperative that you return to your primary care physician (or establish a relationship with a primary care physician if you do not have one) for your aftercare needs so that they can reassess your need for medications and monitor your lab values.  Discharge Instructions    Call MD for:  difficulty breathing, headache or visual disturbances   Complete by:  As directed    Call MD for:  extreme fatigue   Complete by:  As directed    Call MD for:  hives   Complete  by:  As directed    Call MD for:  persistant dizziness or light-headedness   Complete by:  As directed    Call MD for:  persistant nausea and vomiting   Complete by:  As directed    Call MD for:  redness, tenderness, or signs of infection (pain, swelling, redness, odor or  green/yellow discharge around incision site)   Complete by:  As directed    Call MD for:  severe uncontrolled pain   Complete by:  As directed    Call MD for:  temperature >100.4   Complete by:  As directed    Diet - low sodium heart healthy   Complete by:  As directed    Discharge instructions   Complete by:  As directed    Follow up with primary care physician in 1 week  Needs to ask your primary doctor about OBGYN referral due to ovarian cyst/mass Ask your physician to repeat CT abdomen in 2-3 weeks  Soft diet for 1 more week   Increase activity slowly   Complete by:  As directed      Allergies as of 08/02/2018      Reactions   Infliximab Other (See Comments)   REACTION: "Enlarged intestines and hernia. Also pushed intestines on lower part of lungs." REACTION: "Enlarged intestines and hernia. Also pushed intestines on lower part of lungs."   Aspirin Nausea And Vomiting   Low Dose is ok Stomach problems   Carvedilol Other (See Comments)   Fatigue/ ill Fatigue/ ill   Codeine Nausea And Vomiting   "Deathly Sick"   Penicillins Rash   Has patient had a PCN reaction causing immediate rash, facial/tongue/throat swelling, SOB or lightheadedness with hypotension: Y Has patient had a PCN reaction causing severe rash involving mucus membranes or skin necrosis: Y Has patient had a PCN reaction that required hospitalization: N Has patient had a PCN reaction occurring within the last 10 years: N If all of the above answers are "NO", then may proceed with Cephalosporin use.      Medication List    STOP taking these medications   cholecalciferol 1000 units tablet Commonly known as:  VITAMIN D   dicyclomine 20 MG tablet Commonly known as:  BENTYL     TAKE these medications   acetaminophen 500 MG tablet Commonly known as:  TYLENOL Take 1,000 mg by mouth every 6 (six) hours as needed (joint pain).   ciprofloxacin 250 MG tablet Commonly known as:  CIPRO Take 1 tablet (250 mg  total) by mouth 2 (two) times daily for 11 days.   gabapentin 100 MG capsule Commonly known as:  NEURONTIN Take 1 capsule (100 mg total) by mouth at bedtime.   levothyroxine 75 MCG tablet Commonly known as:  SYNTHROID, LEVOTHROID Take 1 tablet (75 mcg total) by mouth daily before breakfast.   metroNIDAZOLE 500 MG tablet Commonly known as:  FLAGYL Take 1 tablet (500 mg total) by mouth 3 (three) times daily for 11 days.   olmesartan 20 MG tablet Commonly known as:  BENICAR Take 1 tablet (20 mg total) by mouth daily.   Olopatadine HCl 0.2 % Soln Place 1 drop into both eyes daily.   saccharomyces boulardii 250 MG capsule Commonly known as:  FLORASTOR Take 1 capsule (250 mg total) by mouth 2 (two) times daily.   thiamine 100 MG tablet Commonly known as:  VITAMIN B-1 Take 100 mg by mouth daily.   traMADol 50 MG tablet Commonly known as:  Veatrice Bourbon  Take 1 tablet (50 mg total) by mouth every 8 (eight) hours as needed for moderate pain.   XANAX 0.25 MG tablet Generic drug:  ALPRAZolam Take 1 tablet (0.25 mg total) by mouth at bedtime as needed for anxiety. What changed:  when to take this      Follow-up [facility].   Contact information: [place] [phone]         Allergies  Allergen Reactions  . Infliximab Other (See Comments)    REACTION: "Enlarged intestines and hernia. Also pushed intestines on lower part of lungs." REACTION: "Enlarged intestines and hernia. Also pushed intestines on lower part of lungs."  . Aspirin Nausea And Vomiting    Low Dose is ok Stomach problems  . Carvedilol Other (See Comments)    Fatigue/ ill Fatigue/ ill  . Codeine Nausea And Vomiting    "Deathly Sick"  . Penicillins Rash    Has patient had a PCN reaction causing immediate rash, facial/tongue/throat swelling, SOB or lightheadedness with hypotension: Y Has patient had a PCN reaction causing severe rash  involving mucus membranes or skin necrosis: Y Has patient had a PCN reaction that required hospitalization: N Has patient had a PCN reaction occurring within the last 10 years: N If all of the above answers are "NO", then may proceed with Cephalosporin use.     Consultations:  Gen surgery    Procedures/Studies: Ct Abdomen Pelvis W Contrast  Result Date: 07/31/2018 CLINICAL DATA:  Abdominal pain, fever, question abscess, recently diagnosed with colitis versus diverticulitis of the sigmoid colon EXAM: CT ABDOMEN AND PELVIS WITH CONTRAST TECHNIQUE: Multidetector CT imaging of the abdomen and pelvis was performed using the standard protocol following bolus administration of intravenous contrast. Sagittal and coronal MPR images reconstructed from axial data set. CONTRAST:  131mL OMNIPAQUE IOHEXOL 300 MG/ML SOLN IV. Dilute oral contrast. COMPARISON:  07/24/2018 FINDINGS: Lower chest: Enlargement of cardiac chambers. Small bibasilar pleural effusions and minimal atelectasis. Hepatobiliary: Mild fatty infiltration of liver. Distended gallbladder without definite calcification Pancreas: Unremarkable Spleen: Small splenic cyst inferiorly. Additional tiny low-attenuation focus question additional cyst centrally in spleen. Adrenals/Urinary Tract: Adrenal glands unremarkable. Age-related renal cortical atrophy. No definite renal mass or hydronephrosis. Unremarkable ureters and bladder. Stomach/Bowel: Scattered colonic diverticulosis. Chronic wall thickening of the sigmoid colon with narrowing of lumen and surrounding inflammatory changes question long segment of diverticulitis versus colitis. Small extraluminal gas collection between sigmoid colon and LEFT ovary. No additional defined abscess collections. Edema of sigmoid mesocolon. Remaining bowel loops unremarkable. Large hiatal hernia. Vascular/Lymphatic: Atherosclerotic calcifications aorta common iliac arteries and coronary arteries. Aorta normal caliber. No  adenopathy. Reproductive: Low-attenuation lesion at the LEFT lateral aspect of the lower uterus question leiomyoma versus small extra uterine collection unchanged 2.0 x 2.0 cm. Otherwise unremarkable uterus and RIGHT adnexa. Multiloculated cystic LEFT ovarian mass 5.5 x 3.7 cm image 55. Other: Increased presacral edema. Increased edema in sigmoid mesocolon. No additional abscess collections. Umbilical hernia containing fat. BILATERAL inguinal hernias containing fat. No free air. Minimal free fluid. Musculoskeletal: Osseous demineralization with scattered degenerative changes of the lumbar spine. IMPRESSION: 2.0 cm diameter leiomyoma versus small adjacent extra uterine collection at the LEFT lateral aspect of uterus, tiny abscess not excluded. Persistent inflammatory changes of the sigmoid colon with wall thickening, luminal narrowing, and extensive infiltration of the sigmoid mesocolon either representing diverticulitis or colitis with pericolic inflammation. Small foci of extraluminal gas are identified without additional drainable abscess collection.  Multiloculated cystic neoplasm of the LEFT ovary, could be malignant or benign. Umbilical and BILATERAL inguinal hernias containing fat. Bibasilar pleural effusions and minimal atelectasis. Electronically Signed   By: Lavonia Dana M.D.   On: 07/31/2018 14:46   Ct Abdomen Pelvis W Contrast  Result Date: 07/24/2018 CLINICAL DATA:  Acute mid abdominal pain and tenderness for 1 week with nausea and vomiting EXAM: CT ABDOMEN AND PELVIS WITH CONTRAST TECHNIQUE: Multidetector CT imaging of the abdomen and pelvis was performed using the standard protocol following bolus administration of intravenous contrast. CONTRAST:  80mL OMNIPAQUE IOHEXOL 300 MG/ML  SOLN COMPARISON:  12/28/2014 upper GI series FINDINGS: Lower chest: Minor basilar scarring. Normal heart size. No pericardial pleural effusion. Large hiatal hernia with the majority of the stomach in the chest. Degenerative  changes of the spine. Hepatobiliary: 10 mm left hepatic hypodensity, suspect small cysts. No significant hepatic abnormality. Gallbladder and biliary system unremarkable. Common bile duct nondilated. Pancreas: Unremarkable. No pancreatic ductal dilatation or surrounding inflammatory changes. Spleen: Inferior splenic subcapsular hypodensity, suspect splenic cyst. No splenic abnormality. Adrenals/Urinary Tract: Normal adrenal glands for age. Kidneys demonstrate no acute perinephric edema, obstruction pattern, hydronephrosis, or obstructive uropathy. Ureters are nondilated. Urinary bladder unremarkable. Stomach/Bowel: Negative for bowel obstruction, significant dilatation, ileus, free air. Extensive colonic diverticulosis. In the pelvis , there is extensive diverticular disease, wall thickening, surrounding edema and pericolonic fluid about the sigmoid colon compatible with sigmoid colitis/diverticulitis. Difficult to exclude underlying mural lesion. Small scattered extraluminal entrapped air about the sigmoid diverticulitis compatible with areas of micro perforation. No drainable fluid collection or abscess. Vascular/Lymphatic: Aortoiliac atherosclerosis noted. No aneurysm or occlusive process. No acute vascular finding. Mesenteric and renal vasculature remain patent. No adenopathy. Reproductive: Uterus is midline mildly prominent size. Right ovary is atrophic. Multi loculated left adnexal ovarian cystic mass measures 4.9 x 3.4 cm, images 51-52. Other: No ascites.  Intact abdominal wall.  No hernia. Musculoskeletal: Degenerative changes of the spine. Bones are osteopenic. Facet arthropathy noted. No acute osseous finding. IMPRESSION: Acute sigmoid colitis/diverticulitis in the left hemipelvis with areas of suspected pericolonic extraluminal air compatible with micro perforations. No significant drainable abscess. No associated obstruction pattern. Large hiatal hernia Abdominal atherosclerosis without aneurysm Complex  septated multi-cystic left ovarian 5 cm mass. Recommend nonemergent outpatient GYN referral to assess for indolent cystic ovarian malignancy. Electronically Signed   By: Jerilynn Mages.  Shick M.D.   On: 07/24/2018 19:18     Discharge Exam: Vitals:   08/01/18 2136 08/02/18 0508  BP: (!) 157/82 (!) 154/83  Pulse: 84 87  Resp:  16  Temp:  99.2 F (37.3 C)  SpO2:  97%   Vitals:   08/01/18 1433 08/01/18 2015 08/01/18 2136 08/02/18 0508  BP: 140/87 (!) 165/104 (!) 157/82 (!) 154/83  Pulse: 100 (!) 126 84 87  Resp: 20 (!) 24  16  Temp:  99.5 F (37.5 C)  99.2 F (37.3 C)  TempSrc:  Oral  Oral  SpO2: 100% 100%  97%  Weight:      Height:        General: Pt is alert, awake, not in acute distress Cardiovascular: RRR, S1/S2 +, no rubs, no gallops Respiratory: CTA bilaterally, no wheezing, no rhonchi Abdominal: Soft, NT, ND, bowel sounds + Extremities: no edema   The results of significant diagnostics from this hospitalization (including imaging, microbiology, ancillary and laboratory) are listed below for reference.     Microbiology: Recent Results (from the past 240 hour(s))  Culture, blood (x 2)  Status: None   Collection Time: 07/24/18  9:28 PM  Result Value Ref Range Status   Specimen Description   Final    BLOOD RIGHT WRIST Performed at [facility] [address], [place], [place] [address]    Special Requests   Final    BOTTLES DRAWN AEROBIC AND ANAEROBIC Blood Culture adequate volume Performed at [facility] [address] [place], [place] [address]    Culture   Final    NO GROWTH 5 DAYS Performed at [facility], [place] [address] [place], [place] [address]    Report Status 07/30/2018 FINAL  Final  Culture, blood (x 2)     Status: None   Collection Time: 07/24/18  9:28 PM  Result Value Ref Range Status   Specimen Description   Final    BLOOD RIGHT WRIST Performed at [facility] [address]  [place], [place] [address]    Special Requests   Final    BOTTLES DRAWN AEROBIC AND ANAEROBIC Blood Culture adequate volume Performed at [facility] [address] [place], [place] [address]    Culture   Final    NO GROWTH 5 DAYS Performed at [facility], [place] [address], [place], [place] [address]    Report Status 07/30/2018 FINAL  Final  Culture, blood (Routine X 2) w Reflex to ID Panel     Status: None   Collection Time: 07/28/18  3:10 PM  Result Value Ref Range Status   Specimen Description   Final    BLOOD LEFT ANTECUBITAL Performed at [facility] [address] [place], [place] [address]    Special Requests   Final    BOTTLES DRAWN AEROBIC ONLY Blood Culture adequate volume Performed at [facility] [address] [place], [place] [address]    Culture   Final    NO GROWTH 5 DAYS Performed at [facility], [place] [address], [place], [place] [address]    Report Status 08/02/2018 FINAL  Final  Culture, blood (Routine X 2) w Reflex to ID Panel     Status: None   Collection Time: 07/28/18  3:12 PM  Result Value Ref Range Status   Specimen Description   Final    BLOOD LEFT HAND Performed at [facility] [address] [place], [place] [address]    Special Requests   Final    BOTTLES DRAWN AEROBIC ONLY Blood Culture adequate volume Performed at [facility] [address] [place], [place] [address]    Culture   Final    NO GROWTH 5 DAYS Performed at [facility], [place] [address], [place], [place] [address]    Report Status 08/02/2018 FINAL  Final  C difficile quick scan w PCR reflex     Status: None   Collection Time: 07/30/18 11:03 AM  Result Value Ref Range Status   C Diff antigen NEGATIVE NEGATIVE Final   C Diff toxin NEGATIVE NEGATIVE Final   C Diff interpretation No C. difficile detected.  Final    Comment: Performed at [facility], [place] [address] [place], [place] [address]     Labs: BNP (last 3 results) No results for input(s): BNP in the last 8760 hours. Basic Metabolic Panel: Recent Labs  Lab 07/27/18 0420 07/29/18 0423 07/30/18 0442 07/31/18 0438 08/01/18 0503  NA 138 138 139 141 138  K 4.3  3.7 3.2* 4.5 4.5  CL 106 107 108 110 109  CO2 25 25 23 23 22   GLUCOSE 93 102* 105* 100* 83  BUN 13 8 7* 7* 6*  CREATININE 0.99 0.98 0.87 0.85 0.85  CALCIUM 8.3* 7.9* 8.0* 8.2* 8.4*  MG  --   --  1.4*  --   --    Liver Function Tests: Recent Labs  Lab 07/30/18 0442  AST 19  ALT 10  ALKPHOS 56  BILITOT 0.6  PROT 5.4*  ALBUMIN 2.4*   No results for input(s): LIPASE, AMYLASE in the last 168 hours. No results for input(s): AMMONIA in the last 168 hours. CBC: Recent Labs  Lab 07/27/18 0420 07/29/18 0423 07/30/18 0442 07/31/18 0438 08/01/18 0503  WBC 8.1 12.5* 10.6* 9.8 10.0  NEUTROABS  --   --   --   --  6.5  HGB 10.2* 9.6* 9.4* 9.9* 10.5*  HCT 32.2* 30.9* 29.5* 30.8* 33.4*  MCV 85.2 86.1 85.8 85.8 86.1  PLT 396 387 331 387 462*   Cardiac Enzymes: No results for input(s): CKTOTAL, CKMB, CKMBINDEX, TROPONINI in the last 168 hours. BNP: Invalid input(s): POCBNP CBG: No results for input(s): GLUCAP in the last 168 hours. D-Dimer No results for input(s): DDIMER in the last 72 hours. Hgb A1c No results for input(s): HGBA1C in the last 72 hours. Lipid Profile No results for input(s): CHOL, HDL, LDLCALC, TRIG, CHOLHDL, LDLDIRECT in the last 72 hours. Thyroid function studies No results for input(s): TSH, T4TOTAL, T3FREE, THYROIDAB in the last 72 hours.  Invalid input(s): FREET3 Anemia work up No results for input(s): VITAMINB12, FOLATE, FERRITIN, TIBC, IRON, RETICCTPCT in the last 72 hours. Urinalysis    Component Value Date/Time   COLORURINE YELLOW 07/24/2018 1656   APPEARANCEUR CLEAR 07/24/2018 1656   LABSPEC 1.016 07/24/2018 1656   PHURINE 5.0 07/24/2018 1656   GLUCOSEU  NEGATIVE 07/24/2018 1656   HGBUR SMALL (A) 07/24/2018 1656   BILIRUBINUR NEGATIVE 07/24/2018 1656   KETONESUR NEGATIVE 07/24/2018 1656   PROTEINUR NEGATIVE 07/24/2018 1656   UROBILINOGEN 0.2 07/03/2014 0313   NITRITE NEGATIVE 07/24/2018 1656   LEUKOCYTESUR MODERATE (A) 07/24/2018 1656   Sepsis Labs Invalid input(s): PROCALCITONIN,  WBC,  LACTICIDVEN Microbiology Recent Results (from the past 240 hour(s))  Culture, blood (x 2)     Status: None   Collection Time: 07/24/18  9:28 PM  Result Value Ref Range Status   Specimen Description   Final    BLOOD RIGHT WRIST Performed at [facility], [place] [address], [place], [place] [address]    Special Requests   Final    BOTTLES DRAWN AEROBIC AND ANAEROBIC Blood Culture adequate volume Performed at [facility] [address] [place], [place] [address]    Culture   Final    NO GROWTH 5 DAYS Performed at [facility], [place] [address] [place], [place] [address]    Report Status 07/30/2018 FINAL  Final  Culture, blood (x 2)     Status: None   Collection Time: 07/24/18  9:28 PM  Result Value Ref Range Status   Specimen Description   Final    BLOOD RIGHT WRIST Performed at [facility] [address], [place],  [address]    Special Requests   Final    BOTTLES DRAWN AEROBIC AND ANAEROBIC Blood Culture adequate volume Performed at [facility] [address], [place],  [address]    Culture   Final  NO GROWTH 5 DAYS Performed at [facility], [place] [address] [place], [place] [address]    Report Status 07/30/2018 FINAL  Final  Culture, blood (Routine X 2) w Reflex to ID Panel     Status: None   Collection Time: 07/28/18  3:10 PM  Result Value Ref Range Status   Specimen Description   Final    BLOOD LEFT ANTECUBITAL Performed at [facility] [address], [place], [place] [address]    Special Requests   Final     BOTTLES DRAWN AEROBIC ONLY Blood Culture adequate volume Performed at [facility] [address] [place], [place] [address]    Culture   Final    NO GROWTH 5 DAYS Performed at [facility], [place] [address], [place], [place] [address]    Report Status 08/02/2018 FINAL  Final  Culture, blood (Routine X 2) w Reflex to ID Panel     Status: None   Collection Time: 07/28/18  3:12 PM  Result Value Ref Range Status   Specimen Description   Final    BLOOD LEFT HAND Performed at [facility] [address] [place], [place] [address]    Special Requests   Final    BOTTLES DRAWN AEROBIC ONLY Blood Culture adequate volume Performed at [facility] [address], [place], [place] [address]    Culture   Final    NO GROWTH 5 DAYS Performed at [facility], [place] [address], [place], [place] [address]    Report Status 08/02/2018 FINAL  Final  C difficile quick scan w PCR reflex     Status: None   Collection Time: 07/30/18 11:03 AM  Result Value Ref Range Status   C Diff antigen NEGATIVE NEGATIVE Final   C Diff toxin NEGATIVE NEGATIVE Final   C Diff interpretation No C. difficile detected.  Final    Comment: Performed at [facility], [place] [address], [place], [place] [address]    Time coordinating discharge: 32 minutes  SIGNED:  Chipper Oman, MD  [facility] 08/02/2018, 12:36 PM  Pager please text page via  [email or web address]  Note - This record has been created using [employer]. Chart creation errors have been sought, but may not always have been located. Such creation errors do not reflect on the standard of medical care.

## 2018-08-05 ENCOUNTER — Telehealth: Payer: Self-pay | Admitting: Behavioral Health

## 2018-08-05 NOTE — Telephone Encounter (Signed)
Attempted to reach patient for TCM/Hospital Follow-up call; no answer. Voicemail not available.

## 2018-08-06 ENCOUNTER — Encounter: Payer: Self-pay | Admitting: Behavioral Health

## 2018-08-06 NOTE — Telephone Encounter (Signed)
Unable to reach patient at time of TCM Call. No v/m available. Will call again on tomorrow, 08/07/18.

## 2018-08-07 NOTE — Telephone Encounter (Signed)
Attempted to contact patient x3. Voicemail not available. Will try calling patient again at a later time.

## 2018-08-08 NOTE — Telephone Encounter (Signed)
The number listed in chart is incorrect. RN was informed that this number dials to [employer].  Per Appointment desk, the patient is scheduled to see Wilfred Lacy, NP on tomorrow, 08/09/18 at 2:00 PM.

## 2018-08-09 ENCOUNTER — Ambulatory Visit (INDEPENDENT_AMBULATORY_CARE_PROVIDER_SITE_OTHER): Payer: Medicare Other | Admitting: Nurse Practitioner

## 2018-08-09 ENCOUNTER — Encounter: Payer: Self-pay | Admitting: Nurse Practitioner

## 2018-08-09 VITALS — BP 132/86 | HR 88 | Temp 98.8°F | Ht 60.0 in | Wt 130.0 lb

## 2018-08-09 DIAGNOSIS — Z23 Encounter for immunization: Secondary | ICD-10-CM | POA: Diagnosis not present

## 2018-08-09 DIAGNOSIS — K5720 Diverticulitis of large intestine with perforation and abscess without bleeding: Secondary | ICD-10-CM

## 2018-08-09 DIAGNOSIS — M6281 Muscle weakness (generalized): Secondary | ICD-10-CM | POA: Diagnosis not present

## 2018-08-09 DIAGNOSIS — D649 Anemia, unspecified: Secondary | ICD-10-CM

## 2018-08-09 DIAGNOSIS — N182 Chronic kidney disease, stage 2 (mild): Secondary | ICD-10-CM | POA: Diagnosis not present

## 2018-08-09 LAB — BASIC METABOLIC PANEL
BUN/Creatinine Ratio: 10 (calc) (ref 6–22)
BUN: 9 mg/dL (ref 7–25)
CO2: 29 mmol/L (ref 20–32)
Calcium: 8.9 mg/dL (ref 8.6–10.4)
Chloride: 100 mmol/L (ref 98–110)
Creat: 0.92 mg/dL — ABNORMAL HIGH (ref 0.60–0.88)
Glucose, Bld: 109 mg/dL — ABNORMAL HIGH (ref 65–99)
Potassium: 4.7 mmol/L (ref 3.5–5.3)
Sodium: 136 mmol/L (ref 135–146)

## 2018-08-09 LAB — CBC WITH DIFFERENTIAL/PLATELET
Basophils Absolute: 72 cells/uL (ref 0–200)
Basophils Relative: 0.5 %
Eosinophils Absolute: 245 cells/uL (ref 15–500)
Eosinophils Relative: 1.7 %
HCT: 33.8 % — ABNORMAL LOW (ref 35.0–45.0)
Hemoglobin: 11.4 g/dL — ABNORMAL LOW (ref 11.7–15.5)
Lymphs Abs: 2405 cells/uL (ref 850–3900)
MCH: 27 pg (ref 27.0–33.0)
MCHC: 33.7 g/dL (ref 32.0–36.0)
MCV: 80.1 fL (ref 80.0–100.0)
MPV: 8.6 fL (ref 7.5–12.5)
Monocytes Relative: 7.2 %
Neutro Abs: 10642 cells/uL — ABNORMAL HIGH (ref 1500–7800)
Neutrophils Relative %: 73.9 %
Platelets: 522 10*3/uL — ABNORMAL HIGH (ref 140–400)
RBC: 4.22 10*6/uL (ref 3.80–5.10)
RDW: 14.3 % (ref 11.0–15.0)
Total Lymphocyte: 16.7 %
WBC mixed population: 1037 cells/uL — ABNORMAL HIGH (ref 200–950)
WBC: 14.4 10*3/uL — ABNORMAL HIGH (ref 3.8–10.8)

## 2018-08-09 MED ORDER — ONDANSETRON HCL 4 MG PO TABS: 4.0000 mg | ORAL_TABLET | Freq: Three times a day (TID) | ORAL | 0 refills | Status: DC | PRN

## 2018-08-09 MED ORDER — TRAMADOL HCL 50 MG PO TABS: 50.0000 mg | ORAL_TABLET | Freq: Three times a day (TID) | ORAL | 0 refills | Status: AC | PRN

## 2018-08-09 NOTE — Progress Notes (Signed)
Subjective:  Patient ID: [name], female    DOB: 1927-03-24  Age: 84 y.o. MRN: [id number]  CC: Hospitalization Follow-up (hospital follow up--probiotic made her sick. 3 rd CAT scan consult/ flu shot/ assistant at home for ADLs consult. )  HPI Accompanied by son.  Hospitalization: 09/25-10/04/19.  [name] is here for hospital f/up. She went to hospital with nausea and ABD pain. She was diagnosed with diverticulitis and microperforation. She declined surgical intervention. She was treated with IV abx x 10days, then discharged with oral abx x 10days. She was found to be dehydrated which led to AKI on CKD. This resolved with IV fluids. Left ovarian mass was also found during hospitalization, but declined any additional evaluation.   Today she complain of intermittent nausea and persistent LLQ ABD pain. Denies any no vomiting, no fever, no melena, no blood in stool. Tolerating oral food and liquids. Normal BM (last this morning) Maintain mechanical soft diet. Unable to tolerate florastor. Still taking metronidazole and cipro as prescribed. Use of tramadol for pain, needs refill  Declined referral to GYN to evaluate ovarian mass.  Reviewed past Medical, Social and Family history today.  Medication list reconciled. Outpatient Medications Prior to Visit  Medication Sig Dispense Refill  . acetaminophen (TYLENOL) 500 MG tablet Take 1,000 mg by mouth every 6 (six) hours as needed (joint pain).     Marland Kitchen gabapentin (NEURONTIN) 100 MG capsule Take 1 capsule (100 mg total) by mouth at bedtime. 90 capsule 3  . levothyroxine (SYNTHROID, LEVOTHROID) 75 MCG tablet Take 1 tablet (75 mcg total) by mouth daily before breakfast. 90 tablet 3  . olmesartan (BENICAR) 20 MG tablet Take 1 tablet (20 mg total) by mouth daily. 30 tablet 1  . Olopatadine HCl 0.2 % SOLN Place 1 drop into both eyes daily.   3  . thiamine (VITAMIN B-1) 100 MG tablet Take 100 mg by mouth daily.    Marland Kitchen XANAX 0.25 MG tablet  Take 1 tablet (0.25 mg total) by mouth at bedtime as needed for anxiety. (Patient taking differently: Take 0.25 mg by mouth at bedtime. ) 30 tablet 1  . traMADol (ULTRAM) 50 MG tablet Take 1 tablet (50 mg total) by mouth every 8 (eight) hours as needed for moderate pain. 20 tablet 0  . ciprofloxacin (CIPRO) 250 MG tablet Take 1 tablet (250 mg total) by mouth 2 (two) times daily for 11 days. (Patient not taking: Reported on 08/09/2018) 22 tablet 0  . metroNIDAZOLE (FLAGYL) 500 MG tablet Take 1 tablet (500 mg total) by mouth 3 (three) times daily for 11 days. (Patient not taking: Reported on 08/09/2018) 33 tablet 0  . saccharomyces boulardii (FLORASTOR) 250 MG capsule Take 1 capsule (250 mg total) by mouth 2 (two) times daily. (Patient not taking: Reported on 08/09/2018) 30 capsule 0   No facility-administered medications prior to visit.     ROS See HPI  Objective:  BP 132/86   Pulse 88   Temp 98.8 F (37.1 C) (Oral)   Ht 5' (1.524 m)   Wt 130 lb (59 kg)   SpO2 99%   BMI 25.39 kg/m   BP Readings from Last 3 Encounters:  08/09/18 132/86  08/02/18 128/89  07/02/18 (!) 148/86    Wt Readings from Last 3 Encounters:  08/09/18 130 lb (59 kg)  07/25/18 111 lb 12.8 oz (50.7 kg)  07/02/18 132 lb (59.9 kg)    Physical Exam  Constitutional: She appears well-developed and well-nourished.  Cardiovascular: Normal  rate and regular rhythm.  Pulmonary/Chest: Effort normal and breath sounds normal.  Abdominal: Soft. She exhibits no distension. There is tenderness in the left lower quadrant. There is no rigidity and no guarding.  Musculoskeletal: She exhibits edema.  Vitals reviewed.   Lab Results  Component Value Date   WBC 14.4 (H) 08/09/2018   HGB 11.4 (L) 08/09/2018   HCT 33.8 (L) 08/09/2018   PLT 522 (H) 08/09/2018   GLUCOSE 109 (H) 08/09/2018   CHOL 240 (H) 07/29/2015   TRIG 220.0 (H) 07/29/2015   HDL 72.50 07/29/2015   LDLDIRECT 146.0 07/29/2015   LDLCALC 80 09/30/2012   ALT  10 07/30/2018   AST 19 07/30/2018   NA 136 08/09/2018   K 4.7 08/09/2018   CL 100 08/09/2018   CREATININE 0.92 (H) 08/09/2018   BUN 9 08/09/2018   CO2 29 08/09/2018   TSH 1.36 07/02/2018   INR 1.08 07/24/2018   HGBA1C 6.0 01/07/2018    Ct Abdomen Pelvis W Contrast  Result Date: 07/24/2018 CLINICAL DATA:  Acute mid abdominal pain and tenderness for 1 week with nausea and vomiting EXAM: CT ABDOMEN AND PELVIS WITH CONTRAST TECHNIQUE: Multidetector CT imaging of the abdomen and pelvis was performed using the standard protocol following bolus administration of intravenous contrast. CONTRAST:  73mL OMNIPAQUE IOHEXOL 300 MG/ML  SOLN COMPARISON:  12/28/2014 upper GI series FINDINGS: Lower chest: Minor basilar scarring. Normal heart size. No pericardial pleural effusion. Large hiatal hernia with the majority of the stomach in the chest. Degenerative changes of the spine. Hepatobiliary: 10 mm left hepatic hypodensity, suspect small cysts. No significant hepatic abnormality. Gallbladder and biliary system unremarkable. Common bile duct nondilated. Pancreas: Unremarkable. No pancreatic ductal dilatation or surrounding inflammatory changes. Spleen: Inferior splenic subcapsular hypodensity, suspect splenic cyst. No splenic abnormality. Adrenals/Urinary Tract: Normal adrenal glands for age. Kidneys demonstrate no acute perinephric edema, obstruction pattern, hydronephrosis, or obstructive uropathy. Ureters are nondilated. Urinary bladder unremarkable. Stomach/Bowel: Negative for bowel obstruction, significant dilatation, ileus, free air. Extensive colonic diverticulosis. In the pelvis , there is extensive diverticular disease, wall thickening, surrounding edema and pericolonic fluid about the sigmoid colon compatible with sigmoid colitis/diverticulitis. Difficult to exclude underlying mural lesion. Small scattered extraluminal entrapped air about the sigmoid diverticulitis compatible with areas of micro perforation.  No drainable fluid collection or abscess. Vascular/Lymphatic: Aortoiliac atherosclerosis noted. No aneurysm or occlusive process. No acute vascular finding. Mesenteric and renal vasculature remain patent. No adenopathy. Reproductive: Uterus is midline mildly prominent size. Right ovary is atrophic. Multi loculated left adnexal ovarian cystic mass measures 4.9 x 3.4 cm, images 51-52. Other: No ascites.  Intact abdominal wall.  No hernia. Musculoskeletal: Degenerative changes of the spine. Bones are osteopenic. Facet arthropathy noted. No acute osseous finding. IMPRESSION: Acute sigmoid colitis/diverticulitis in the left hemipelvis with areas of suspected pericolonic extraluminal air compatible with micro perforations. No significant drainable abscess. No associated obstruction pattern. Large hiatal hernia Abdominal atherosclerosis without aneurysm Complex septated multi-cystic left ovarian 5 cm mass. Recommend nonemergent outpatient GYN referral to assess for indolent cystic ovarian malignancy. Electronically Signed   By: Jerilynn Mages.  Shick M.D.   On: 07/24/2018 19:18    Assessment & Plan:   Brittie was seen today for hospitalization follow-up.  Diagnoses and all orders for this visit:  Diverticulitis of large intestine with perforation without abscess or bleeding -     CBC w/Diff -     Basic metabolic panel -     traMADol (ULTRAM) 50 MG tablet; Take 1 tablet (  50 mg total) by mouth every 8 (eight) hours as needed for up to 7 days for moderate pain. -     ondansetron (ZOFRAN) 4 MG tablet; Take 1 tablet (4 mg total) by mouth every 8 (eight) hours as needed for nausea or vomiting. -     CT Abdomen Pelvis W Contrast; Future  CKD (chronic kidney disease) stage 2, GFR 60-89 ml/min  Anemia, unspecified type  Generalized muscle weakness -     Ambulatory referral to [facility]  Need for influenza vaccination -     Flu vaccine HIGH DOSE PF  Need for pneumococcal vaccination -     Pneumococcal conjugate vaccine  13-valent IM  Other orders -     Cancel: CT Abdomen Pelvis W Contrast; Future   I have changed Deloria Lair. Stahl's traMADol. I am also having her start on ondansetron. Additionally, I am having her maintain her acetaminophen, XANAX, levothyroxine, gabapentin, olmesartan, Olopatadine HCl, thiamine, saccharomyces boulardii, ciprofloxacin, and metroNIDAZOLE.  Meds ordered this encounter  Medications  . traMADol (ULTRAM) 50 MG tablet    Sig: Take 1 tablet (50 mg total) by mouth every 8 (eight) hours as needed for up to 7 days for moderate pain.    Dispense:  21 tablet    Refill:  0    Order Specific Question:   Supervising Provider    Answer:   MATTHEWS, CODY [[id number]]  . ondansetron (ZOFRAN) 4 MG tablet    Sig: Take 1 tablet (4 mg total) by mouth every 8 (eight) hours as needed for nausea or vomiting.    Dispense:  20 tablet    Refill:  0    Order Specific Question:   Supervising Provider    Answer:   MATTHEWS, CODY [[id number]]    Follow-up: Return if symptoms worsen or fail to improve.  Wilfred Lacy, NP

## 2018-08-09 NOTE — Patient Instructions (Addendum)
Complete oral abx as prescribed.  You will be contacted to schedule CT ABD/pelvis.  Maintain mechanical soft diet and adequate oral hydration with water mostly.  Cbc indicates increase in WBC and stable anemia. I ordered for CT ABD/pelvic to be done sooner. Stable renal function.  Diverticulitis Diverticulitis is when small pockets in your large intestine (colon) get infected or swollen. This causes stomach pain and watery poop (diarrhea). These pouches are called diverticula. They form in people who have a condition called diverticulosis. Follow these instructions at home: Medicines  Take over-the-counter and prescription medicines only as told by your doctor. These include: ? Antibiotics. ? Pain medicines. ? Fiber pills. ? Probiotics. ? Stool softeners.  Do not drive or use heavy machinery while taking prescription pain medicine.  If you were prescribed an antibiotic, take it as told. Do not stop taking it even if you feel better. General instructions  Follow a diet as told by your doctor.  When you feel better, your doctor may tell you to change your diet. You may need to eat a lot of fiber. Fiber makes it easier to poop (have bowel movements). Healthy foods with fiber include: ? Berries. ? Beans. ? Lentils. ? Green vegetables.  Exercise 3 or more times a week. Aim for 30 minutes each time. Exercise enough to sweat and make your heart beat faster.  Keep all follow-up visits as told. This is important. You may need to have an exam of the large intestine. This is called a colonoscopy. Contact a doctor if:  Your pain does not get better.  You have a hard time eating or drinking.  You are not pooping like normal. Get help right away if:  Your pain gets worse.  Your problems do not get better.  Your problems get worse very fast.  You have a fever.  You throw up (vomit) more than one time.  You have poop that  is: ? Bloody. ? Black. ? Tarry. Summary  Diverticulitis is when small pockets in your large intestine (colon) get infected or swollen.  Take medicines only as told by your doctor.  Follow a diet as told by your doctor. This information is not intended to replace advice given to you by your health care provider. Make sure you discuss any questions you have with your health care provider. Document Released: 04/03/2008 Document Revised: 11/02/2016 Document Reviewed: 11/02/2016 Elsevier Interactive Patient Education  2017 [employer].

## 2018-08-12 ENCOUNTER — Encounter: Payer: Self-pay | Admitting: Nurse Practitioner

## 2018-08-14 ENCOUNTER — Other Ambulatory Visit: Payer: Self-pay

## 2018-08-19 ENCOUNTER — Inpatient Hospital Stay: Admission: RE | Admit: 2018-08-19 | Payer: Self-pay | Source: Ambulatory Visit

## 2018-08-28 ENCOUNTER — Telehealth: Payer: Self-pay | Admitting: Nurse Practitioner

## 2018-08-28 DIAGNOSIS — F418 Other specified anxiety disorders: Secondary | ICD-10-CM

## 2018-08-28 MED ORDER — XANAX 0.25 MG PO TABS: 0.2500 mg | ORAL_TABLET | Freq: Every evening | ORAL | 1 refills | Status: DC | PRN

## 2018-08-28 NOTE — Telephone Encounter (Signed)
Pt request refill send to [facility] in [place] now, last PMP checked out , pt pick up 30 tab 07/03/18 and 08/02/18 at [facility] on [address].

## 2018-08-28 NOTE — Telephone Encounter (Signed)
Requested medication (s) are due for refill today -yes  Requested medication (s) are on the active medication list -yes  Future visit scheduled -yes  Last refill: 07/02/18  Notes to clinic: Patient is requesting refill of non delegated Rx  Requested Prescriptions  Pending Prescriptions Disp Refills   XANAX 0.25 MG tablet 30 tablet 1    Sig: Take 1 tablet (0.25 mg total) by mouth at bedtime as needed for anxiety.     Not Delegated - Psychiatry:  Anxiolytics/Hypnotics Failed - 08/28/2018 10:04 AM      Failed - This refill cannot be delegated      Failed - Urine Drug Screen completed in last 360 days.      Passed - Valid encounter within last 6 months    Recent Outpatient Visits          2 weeks ago Diverticulitis of large intestine with perforation without abscess or bleeding   [facility] [place], Charlene Brooke, NP   1 month ago Paresthesia of both lower extremities   [facility], Charlene Brooke, NP   3 months ago Paresthesia of both lower extremities   [facility], Charlene Brooke, NP   4 months ago Neuropathic pain   [facility], Charlene Brooke, NP   6 months ago Paresthesia of both lower extremities   [facility], [place], NP      Future Appointments            In 1 month [place], Charlene Brooke, NP [facility], [facility]            Requested Prescriptions  Pending Prescriptions Disp Refills   XANAX 0.25 MG tablet 30 tablet 1    Sig: Take 1 tablet (0.25 mg total) by mouth at bedtime as needed for anxiety.     Not Delegated - Psychiatry:  Anxiolytics/Hypnotics Failed - 08/28/2018 10:04 AM      Failed - This refill cannot be delegated      Failed - Urine Drug Screen completed in last 360 days.      Passed - Valid encounter within last 6 months    Recent Outpatient Visits          2 weeks ago Diverticulitis of large  intestine with perforation without abscess or bleeding   [facility] [place], Charlene Brooke, NP   1 month ago Paresthesia of both lower extremities   [facility], Charlene Brooke, NP   3 months ago Paresthesia of both lower extremities   [facility], Charlene Brooke, NP   4 months ago Neuropathic pain   [facility], NP   6 months ago Paresthesia of both lower extremities   [facility], Charlene Brooke, NP      Future Appointments            In 1 month [place], Charlene Brooke, NP [facility], [place]

## 2018-08-28 NOTE — Telephone Encounter (Signed)
Copied from [place] [id number]. Topic: Quick Communication - Rx Refill/Question >> Aug 28, 2018  9:59 AM Bea Graff, NT wrote: Medication: Duanne Moron 0.25 MG tablet  Has the patient contacted their pharmacy? Yes.   (Agent: If no, request that the patient contact the pharmacy for the refill.) (Agent: If yes, when and what did the pharmacy advise?)  Preferred Pharmacy (with phone number or street name): [place], [place] [phone] (Phone) [phone] (Fax)    Agent: Please be advised that RX refills may take up to 3 business days. We ask that you follow-up with your pharmacy.

## 2018-08-29 MED ORDER — ALPRAZOLAM 0.25 MG PO TABS: 0.2500 mg | ORAL_TABLET | Freq: Every evening | ORAL | 1 refills | Status: DC | PRN

## 2018-08-29 NOTE — Telephone Encounter (Signed)
[name] please resend this med, pharmacy rep stated they received this Rx with the DAW of Xanax.

## 2018-08-29 NOTE — Addendum Note (Signed)
Addended by: Wilfred Lacy L on: 08/29/2018 11:10 AM   Modules accepted: Orders

## 2018-08-29 NOTE — Telephone Encounter (Signed)
Pharmacy stated the new Script for XANAX 0.25 MG tablet was written for Name Brand. Patient has been receiving generic as insurance will not cover name brand. Pharmacy wanted to check if this was done in error. Please advise Pharmacy.   [place], [place] [phone] (Phone) [phone] (Fax)

## 2018-08-29 NOTE — Telephone Encounter (Signed)
Pharmacy stated the new Script for XANAX 0.25 MG tablet was written for Name Brand. Patient has been receiving generic as insurance will not cover name brand. Pharmacy wanted to check if this was done in error. Please advise Pharmacy.   [place], [place] [phone] (Phone) [phone] (Fax)

## 2018-09-05 DIAGNOSIS — I10 Essential (primary) hypertension: Secondary | ICD-10-CM | POA: Diagnosis not present

## 2018-09-05 DIAGNOSIS — R110 Nausea: Secondary | ICD-10-CM | POA: Diagnosis not present

## 2018-09-06 ENCOUNTER — Inpatient Hospital Stay (HOSPITAL_COMMUNITY)
Admission: EM | Admit: 2018-09-06 | Discharge: 2018-09-13 | DRG: 392 | Disposition: A | Discharge status: Home Health | Payer: Medicare Other | Attending: Internal Medicine | Admitting: Internal Medicine

## 2018-09-06 ENCOUNTER — Encounter (HOSPITAL_COMMUNITY): Payer: Self-pay

## 2018-09-06 ENCOUNTER — Other Ambulatory Visit: Payer: Self-pay

## 2018-09-06 DIAGNOSIS — N83202 Unspecified ovarian cyst, left side: Secondary | ICD-10-CM

## 2018-09-06 DIAGNOSIS — Z9049 Acquired absence of other specified parts of digestive tract: Secondary | ICD-10-CM

## 2018-09-06 DIAGNOSIS — Z95828 Presence of other vascular implants and grafts: Secondary | ICD-10-CM

## 2018-09-06 DIAGNOSIS — I4819 Other persistent atrial fibrillation: Secondary | ICD-10-CM | POA: Diagnosis present

## 2018-09-06 DIAGNOSIS — Z8249 Family history of ischemic heart disease and other diseases of the circulatory system: Secondary | ICD-10-CM

## 2018-09-06 DIAGNOSIS — Z9181 History of falling: Secondary | ICD-10-CM

## 2018-09-06 DIAGNOSIS — E785 Hyperlipidemia, unspecified: Secondary | ICD-10-CM | POA: Diagnosis present

## 2018-09-06 DIAGNOSIS — I4891 Unspecified atrial fibrillation: Secondary | ICD-10-CM | POA: Diagnosis not present

## 2018-09-06 DIAGNOSIS — K5780 Diverticulitis of intestine, part unspecified, with perforation and abscess without bleeding: Secondary | ICD-10-CM | POA: Diagnosis not present

## 2018-09-06 DIAGNOSIS — E039 Hypothyroidism, unspecified: Secondary | ICD-10-CM | POA: Diagnosis present

## 2018-09-06 DIAGNOSIS — Z79890 Hormone replacement therapy (postmenopausal): Secondary | ICD-10-CM

## 2018-09-06 DIAGNOSIS — I482 Chronic atrial fibrillation, unspecified: Secondary | ICD-10-CM | POA: Diagnosis not present

## 2018-09-06 DIAGNOSIS — M792 Neuralgia and neuritis, unspecified: Secondary | ICD-10-CM | POA: Diagnosis present

## 2018-09-06 DIAGNOSIS — E876 Hypokalemia: Secondary | ICD-10-CM | POA: Diagnosis not present

## 2018-09-06 DIAGNOSIS — Z87891 Personal history of nicotine dependence: Secondary | ICD-10-CM

## 2018-09-06 DIAGNOSIS — K5792 Diverticulitis of intestine, part unspecified, without perforation or abscess without bleeding: Secondary | ICD-10-CM | POA: Diagnosis present

## 2018-09-06 DIAGNOSIS — M069 Rheumatoid arthritis, unspecified: Secondary | ICD-10-CM | POA: Diagnosis present

## 2018-09-06 DIAGNOSIS — R627 Adult failure to thrive: Secondary | ICD-10-CM | POA: Diagnosis present

## 2018-09-06 DIAGNOSIS — E440 Moderate protein-calorie malnutrition: Secondary | ICD-10-CM | POA: Diagnosis not present

## 2018-09-06 DIAGNOSIS — Z7982 Long term (current) use of aspirin: Secondary | ICD-10-CM

## 2018-09-06 DIAGNOSIS — I10 Essential (primary) hypertension: Secondary | ICD-10-CM | POA: Diagnosis present

## 2018-09-06 DIAGNOSIS — R109 Unspecified abdominal pain: Secondary | ICD-10-CM | POA: Diagnosis not present

## 2018-09-06 DIAGNOSIS — I350 Nonrheumatic aortic (valve) stenosis: Secondary | ICD-10-CM | POA: Diagnosis present

## 2018-09-06 DIAGNOSIS — D72829 Elevated white blood cell count, unspecified: Secondary | ICD-10-CM

## 2018-09-06 DIAGNOSIS — Z79899 Other long term (current) drug therapy: Secondary | ICD-10-CM

## 2018-09-06 DIAGNOSIS — Z6823 Body mass index (BMI) 23.0-23.9, adult: Secondary | ICD-10-CM

## 2018-09-06 LAB — CBC WITH DIFFERENTIAL/PLATELET
Abs Immature Granulocytes: 0.15 10*3/uL — ABNORMAL HIGH (ref 0.00–0.07)
Basophils Absolute: 0.1 10*3/uL (ref 0.0–0.1)
Basophils Relative: 0 %
Eosinophils Absolute: 0 10*3/uL (ref 0.0–0.5)
Eosinophils Relative: 0 %
HCT: 38.3 % (ref 36.0–46.0)
Hemoglobin: 12 g/dL (ref 12.0–15.0)
Immature Granulocytes: 1 %
Lymphocytes Relative: 5 %
Lymphs Abs: 1.5 10*3/uL (ref 0.7–4.0)
MCH: 26.5 pg (ref 26.0–34.0)
MCHC: 31.3 g/dL (ref 30.0–36.0)
MCV: 84.5 fL (ref 80.0–100.0)
Monocytes Absolute: 0.9 10*3/uL (ref 0.1–1.0)
Monocytes Relative: 3 %
Neutro Abs: 25.2 10*3/uL — ABNORMAL HIGH (ref 1.7–7.7)
Neutrophils Relative %: 91 %
Platelets: 470 10*3/uL — ABNORMAL HIGH (ref 150–400)
RBC: 4.53 MIL/uL (ref 3.87–5.11)
RDW: 15.6 % — ABNORMAL HIGH (ref 11.5–15.5)
WBC: 27.8 10*3/uL — ABNORMAL HIGH (ref 4.0–10.5)
nRBC: 0 % (ref 0.0–0.2)

## 2018-09-06 LAB — BASIC METABOLIC PANEL WITH GFR
Anion gap: 12 (ref 5–15)
BUN: 14 mg/dL (ref 8–23)
CO2: 24 mmol/L (ref 22–32)
Calcium: 8.5 mg/dL — ABNORMAL LOW (ref 8.9–10.3)
Chloride: 99 mmol/L (ref 98–111)
Creatinine, Ser: 0.98 mg/dL (ref 0.44–1.00)
GFR calc Af Amer: 58 mL/min — ABNORMAL LOW
GFR calc non Af Amer: 50 mL/min — ABNORMAL LOW
Glucose, Bld: 138 mg/dL — ABNORMAL HIGH (ref 70–99)
Potassium: 3.7 mmol/L (ref 3.5–5.1)
Sodium: 135 mmol/L (ref 135–145)

## 2018-09-06 LAB — URINALYSIS, ROUTINE W REFLEX MICROSCOPIC
Bilirubin Urine: NEGATIVE
Glucose, UA: NEGATIVE mg/dL
Ketones, ur: NEGATIVE mg/dL
Nitrite: NEGATIVE
Protein, ur: NEGATIVE mg/dL
Specific Gravity, Urine: 1.01 (ref 1.005–1.030)
pH: 7 (ref 5.0–8.0)

## 2018-09-06 LAB — TSH: TSH: 1.732 u[IU]/mL (ref 0.350–4.500)

## 2018-09-06 MED ORDER — MECLIZINE HCL 25 MG PO TABS
12.5000 mg | ORAL_TABLET | Freq: Three times a day (TID) | ORAL | Status: DC | PRN
  Administered 2018-09-06: 12.5 mg via ORAL
  Filled 2018-09-06 (×2): qty 1

## 2018-09-06 MED ORDER — ENOXAPARIN SODIUM 40 MG/0.4ML ~~LOC~~ SOLN: 40.0000 mg | SUBCUTANEOUS | Status: DC

## 2018-09-06 MED ORDER — ACETAMINOPHEN 325 MG PO TABS
650.0000 mg | ORAL_TABLET | Freq: Four times a day (QID) | ORAL | Status: DC | PRN
  Filled 2018-09-06: qty 2

## 2018-09-06 MED ORDER — ACETAMINOPHEN 500 MG PO TABS
1000.0000 mg | ORAL_TABLET | Freq: Four times a day (QID) | ORAL | Status: DC | PRN
  Administered 2018-09-07: 1000 mg via ORAL
  Filled 2018-09-06: qty 2

## 2018-09-06 MED ORDER — OLOPATADINE HCL 0.1 % OP SOLN
1.0000 [drp] | Freq: Two times a day (BID) | OPHTHALMIC | Status: DC
  Administered 2018-09-06 – 2018-09-13 (×15): 1 [drp] via OPHTHALMIC
  Filled 2018-09-06: qty 5

## 2018-09-06 MED ORDER — DILTIAZEM HCL-DEXTROSE 100-5 MG/100ML-% IV SOLN (PREMIX)
5.0000 mg/h | Freq: Once | INTRAVENOUS | Status: AC
  Administered 2018-09-06: 5 mg/h via INTRAVENOUS
  Filled 2018-09-06: qty 100

## 2018-09-06 MED ORDER — ONDANSETRON HCL 4 MG PO TABS
4.0000 mg | ORAL_TABLET | Freq: Three times a day (TID) | ORAL | Status: DC | PRN
  Filled 2018-09-06 (×3): qty 1

## 2018-09-06 MED ORDER — MECLIZINE HCL 25 MG PO TABS
25.0000 mg | ORAL_TABLET | Freq: Once | ORAL | Status: AC
  Administered 2018-09-06: 25 mg via ORAL
  Filled 2018-09-06: qty 1

## 2018-09-06 MED ORDER — METOPROLOL SUCCINATE ER 25 MG PO TB24
25.0000 mg | ORAL_TABLET | Freq: Every day | ORAL | Status: DC
  Administered 2018-09-06 – 2018-09-12 (×7): 25 mg via ORAL
  Filled 2018-09-06 (×7): qty 1

## 2018-09-06 MED ORDER — ENOXAPARIN SODIUM 30 MG/0.3ML ~~LOC~~ SOLN
30.0000 mg | SUBCUTANEOUS | Status: DC
  Administered 2018-09-06 – 2018-09-10 (×5): 30 mg via SUBCUTANEOUS
  Filled 2018-09-06 (×5): qty 0.3

## 2018-09-06 MED ORDER — ALPRAZOLAM 0.25 MG PO TABS
0.2500 mg | ORAL_TABLET | Freq: Every evening | ORAL | Status: DC | PRN
  Administered 2018-09-10 – 2018-09-13 (×2): 0.25 mg via ORAL
  Filled 2018-09-06 (×3): qty 1

## 2018-09-06 MED ORDER — ASPIRIN 81 MG PO CHEW
81.0000 mg | CHEWABLE_TABLET | Freq: Every day | ORAL | Status: DC
  Administered 2018-09-06 – 2018-09-13 (×8): 81 mg via ORAL
  Filled 2018-09-06 (×8): qty 1

## 2018-09-06 MED ORDER — GABAPENTIN 100 MG PO CAPS
100.0000 mg | ORAL_CAPSULE | Freq: Every day | ORAL | Status: DC
  Administered 2018-09-07 – 2018-09-12 (×6): 100 mg via ORAL
  Filled 2018-09-06 (×7): qty 1

## 2018-09-06 MED ORDER — LEVOTHYROXINE SODIUM 50 MCG PO TABS
75.0000 ug | ORAL_TABLET | Freq: Every day | ORAL | Status: DC
  Administered 2018-09-06 – 2018-09-13 (×8): 75 ug via ORAL
  Filled 2018-09-06 (×9): qty 1

## 2018-09-06 NOTE — ED Notes (Signed)
Shirlean Mylar, RN aware of previously charted vitals.
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
## 2018-09-06 NOTE — ED Notes (Signed)
Bed: WA09 Expected date:  Expected time:  Means of arrival:  Comments: 84 yr old nausea, abd pain

## 2018-09-06 NOTE — ED Notes (Signed)
ED TO INPATIENT HANDOFF REPORT  Name/Age/Gender [name] 84 y.o. female  Code Status Code Status History    Date Active Date Inactive Code Status Order ID Comments User Context   07/24/2018 2044 08/02/2018 1802 Full Code [id number]  Norval Morton, MD ED   09/01/2014 2013 09/06/2014 1551 Full Code [id number]  Shanda Howells, MD Inpatient   07/03/2014 0256 07/04/2014 2140 Full Code [id number]  Skeet Latch, MD Inpatient   05/15/2014 1455 05/16/2014 1650 Full Code [id number]  Kelvin Cellar, MD Inpatient   04/09/2014 1952 04/12/2014 1711 Full Code [id number]  Elmarie Shiley, MD Inpatient   04/01/2014 1535 04/06/2014 1430 Full Code [id number]  Allie Bossier, MD ED   01/22/2014 1730 01/23/2014 1339 Full Code [id number]  Thurnell Lose, MD Inpatient      Home/SNF/Other Home  Chief Complaint nausea  Level of Care/Admitting Diagnosis ED Disposition    ED Disposition Condition Comment   Admit  Hospital Area: [facility] [[id number]]  Level of Care: Telemetry [5]  Admit to tele based on following criteria: Other see comments  Comments: Afib with RVR  Diagnosis: Atrial fibrillation with rapid ventricular response [facility]) [338250]  Admitting Physician: Shela Leff [[id number]]  Attending Physician: Shela Leff [[id number]]  PT Class (Do Not Modify): Observation [104]  PT Acc Code (Do Not Modify): Observation [10022]       Medical History Past Medical History:  Diagnosis Date  . A-fib ([place])   . Aortic stenosis, moderate    last echo in 2010; AVA 1.1; mild AS per echo in June 2013  . Edema of both legs    s/p laser treatment per Dr. Donnetta Hutching  . Hiatal hernia   . History of chicken pox   . HLD (hyperlipidemia)   . HTN (hypertension)   . Hypothyroidism   . IBS (irritable bowel syndrome)   . Iron deficiency anemia   . Neuropathic pain 09/06/2015  . Rheumatoid arthritis (HCC)     Allergies Allergies  Allergen Reactions  . Infliximab Other (See  Comments)    REACTION: "Enlarged intestines and hernia. Also pushed intestines on lower part of lungs." REACTION: "Enlarged intestines and hernia. Also pushed intestines on lower part of lungs."  . Aspirin Nausea And Vomiting    Low Dose is ok Stomach problems  . Carvedilol Other (See Comments)    Fatigue/ ill Fatigue/ ill  . Codeine Nausea And Vomiting    "Deathly Sick"  . Penicillins Rash    Has patient had a PCN reaction causing immediate rash, facial/tongue/throat swelling, SOB or lightheadedness with hypotension: Y Has patient had a PCN reaction causing severe rash involving mucus membranes or skin necrosis: Y Has patient had a PCN reaction that required hospitalization: N Has patient had a PCN reaction occurring within the last 10 years: N If all of the above answers are "NO", then may proceed with Cephalosporin use.     IV Location/Drains/Wounds Patient Lines/Drains/Airways Status   Active Line/Drains/Airways    Name:   Placement date:   Placement time:   Site:   Days:   Peripheral IV 09/06/18 Left Forearm   09/06/18    0218    Forearm   less than 1          Labs/Imaging Results for orders placed or performed during the hospital encounter of 09/06/18 (from the past 48 hour(s))  Basic metabolic panel     Status: Abnormal   Collection Time: 09/06/18  1:55 AM  Result Value Ref  Range   Sodium 135 135 - 145 mmol/L   Potassium 3.7 3.5 - 5.1 mmol/L   Chloride 99 98 - 111 mmol/L   CO2 24 22 - 32 mmol/L   Glucose, Bld 138 (H) 70 - 99 mg/dL   BUN 14 8 - 23 mg/dL   Creatinine, Ser 0.98 0.44 - 1.00 mg/dL   Calcium 8.5 (L) 8.9 - 10.3 mg/dL   GFR calc non Af Amer 50 (L) >60 mL/min   GFR calc Af Amer 58 (L) >60 mL/min    Comment: (NOTE) The eGFR has been calculated using the CKD EPI equation. This calculation has not been validated in all clinical situations. eGFR's persistently <60 mL/min signify possible Chronic Kidney Disease.    Anion gap 12 5 - 15    Comment:  Performed at [facility], [place] [address], [place], [place] [address]  CBC with Differential/Platelet     Status: Abnormal   Collection Time: 09/06/18  1:55 AM  Result Value Ref Range   WBC 27.8 (H) 4.0 - 10.5 K/uL   RBC 4.53 3.87 - 5.11 MIL/uL   Hemoglobin 12.0 12.0 - 15.0 g/dL   HCT 38.3 36.0 - 46.0 %   MCV 84.5 80.0 - 100.0 fL   MCH 26.5 26.0 - 34.0 pg   MCHC 31.3 30.0 - 36.0 g/dL   RDW 15.6 (H) 11.5 - 15.5 %   Platelets 470 (H) 150 - 400 K/uL   nRBC 0.0 0.0 - 0.2 %   Neutrophils Relative % 91 %   Neutro Abs 25.2 (H) 1.7 - 7.7 K/uL   Lymphocytes Relative 5 %   Lymphs Abs 1.5 0.7 - 4.0 K/uL   Monocytes Relative 3 %   Monocytes Absolute 0.9 0.1 - 1.0 K/uL   Eosinophils Relative 0 %   Eosinophils Absolute 0.0 0.0 - 0.5 K/uL   Basophils Relative 0 %   Basophils Absolute 0.1 0.0 - 0.1 K/uL   WBC Morphology WHITE BLOOD CELL COUNT CONFIRMED    Immature Granulocytes 1 %   Abs Immature Granulocytes 0.15 (H) 0.00 - 0.07 K/uL    Comment: Performed at [facility], [place] [address], [place], [place] [address]  Urinalysis, Routine w reflex microscopic     Status: Abnormal   Collection Time: 09/06/18  3:22 AM  Result Value Ref Range   Color, Urine YELLOW YELLOW   APPearance CLEAR CLEAR   Specific Gravity, Urine 1.010 1.005 - 1.030   pH 7.0 5.0 - 8.0   Glucose, UA NEGATIVE NEGATIVE mg/dL   Hgb urine dipstick SMALL (A) NEGATIVE   Bilirubin Urine NEGATIVE NEGATIVE   Ketones, ur NEGATIVE NEGATIVE mg/dL   Protein, ur NEGATIVE NEGATIVE mg/dL   Nitrite NEGATIVE NEGATIVE   Leukocytes, UA TRACE (A) NEGATIVE   RBC / HPF 0-5 0 - 5 RBC/hpf   WBC, UA 6-10 0 - 5 WBC/hpf   Bacteria, UA RARE (A) NONE SEEN   Squamous Epithelial / LPF 0-5 0 - 5   Mucus PRESENT     Comment: Performed at [facility], [place] [address] [place], [place] [address]   No results found.  Pending Labs Unresulted Labs (From admission, onward)   None       Vitals/Pain Today's Vitals   09/06/18 0248 09/06/18 0332 09/06/18 0334 09/06/18 0400  BP: (!) 147/134 104/77 104/77 (!) 89/66  Pulse: 68 (!) 115 94 94  Resp: 16 (!) 24 (!) 26 20  Temp:  TempSrc:      SpO2: 98% 98% 98% 97%  Weight:      Height:      PainSc:        Isolation Precautions No active isolations  Medications Medications  diltiazem (CARDIZEM) 100 mg in dextrose 5% 126m (1 mg/mL) infusion (0 mg/hr Intravenous Stopped 09/06/18 0436)  meclizine (ANTIVERT) tablet 25 mg (25 mg Oral Given 09/06/18 0217)    Mobility walks

## 2018-09-06 NOTE — Progress Notes (Signed)
Initial Nutrition Assessment  DOCUMENTATION CODES:   Non-severe (moderate) malnutrition in context of acute illness/injury  INTERVENTION:   Magic cup TID with meals, each supplement provides 290 kcal and 9 grams of protein  NUTRITION DIAGNOSIS:   Moderate Malnutrition related to acute illness(sigmoid diverticulitis/fear of eating) as evidenced by energy intake < 75% for > or equal to 1 month, mild muscle depletion, percent weight loss.  GOAL:   Patient will meet greater than or equal to 90% of their needs  MONITOR:   Supplement acceptance, PO intake, Weight trends, Labs, I & O's  REASON FOR ASSESSMENT:   Consult Assessment of nutrition requirement/status  ASSESSMENT:   Patient with PMH significant for HTN, HLD, A. fib, AS, hypothyroidism, OA, and rheumatoid arthritis. Recently admitted 9/26 with acute sigmoid diverticulitis/colitis. Presents this admission with A.fib RVR and dizziness.    Pt endorses having a poor appetite after being discharged at the beginning of October. She was encouraged to follow a GI soft diet for her diverticulitis and has followed these recommendations. Pt typically eats 3-4 times daily but they are small food options like applesauce, pudding, and chicken. She stopped taking supplements because she is afraid to get diarrhea again. RD encouraged pt to add fiber slowly back into diet and discussed alternative supplement options. Pt on a regular diet and consumed 85% of her breakfast. Denies any nausea or vomiting.   Pt reports a UBW of 150 lb. Records indicate pt weighed 134 lb on 04/30/18 and 119 lb this admission (11.2% wt loss in 4 months, significant for time frame). Nutrition-Focused physical exam completed. Pt continues to be malnourished.   Medications reviewed.  Labs reviewed.   NUTRITION - FOCUSED PHYSICAL EXAM:    Most Recent Value  Orbital Region  No depletion  Upper Arm Region  No depletion  Thoracic and Lumbar Region  Unable to assess   Buccal Region  No depletion  Temple Region  Mild depletion  Clavicle Bone Region  Moderate depletion  Clavicle and Acromion Bone Region  Mild depletion  Scapular Bone Region  Unable to assess  Dorsal Hand  Mild depletion  Patellar Region  Mild depletion  Anterior Thigh Region  Mild depletion  Posterior Calf Region  Mild depletion  Edema (RD Assessment)  None      Diet Order:   Diet Order            Diet regular Room service appropriate? Yes; Fluid consistency: Thin  Diet effective now              EDUCATION NEEDS:   Education needs have been addressed  Skin:  Skin Assessment: Reviewed RN Assessment  Last BM:  09/06/18  Height:   Ht Readings from Last 1 Encounters:  09/06/18 5' (1.524 m)    Weight:   Wt Readings from Last 1 Encounters:  09/06/18 54.3 kg    Ideal Body Weight:  45.5 kg  BMI:  Body mass index is 23.4 kg/m.  Estimated Nutritional Needs:   Kcal:  1600-1800 kcal  Protein:  80-95 grams  Fluid:  >/= 1.6 L/day   Mariana Single RD, LDN Clinical Nutrition Pager # - [phone]
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
## 2018-09-06 NOTE — ED Notes (Signed)
Attempted calling report, no answer

## 2018-09-06 NOTE — Evaluation (Signed)
Physical Therapy Evaluation Patient Details Name: [name] MRN: [id number] DOB: January 19, 1927 Today's Date: 09/06/2018   History of Present Illness  84 y.o. female with medical history significant of A. fib not on anticoagulation, aortic stenosis, hypertension, hypothyroidism, OA, RA, neuropathy who presents to the emergency department from home with complaints of dizziness, nausea and found to be in afib with RVR  Clinical Impression  Pt admitted with above diagnosis. Pt currently with functional limitations due to the deficits listed below (see PT Problem List).  Pt will benefit from skilled PT to increase their independence and safety with mobility to allow discharge to the venue listed below.  Pt unable to tolerate standing due to dizziness.  Pt with leaning to Right side and swaying upon standing requiring mod assist.  Pt felt unable to ambulate at this time. Pt reports dizziness started yesterday.  She was getting OOB bring her feet over and reports the next thing she remembers is being on her knees (?syncopal episode).  Pt admitted with afib with RVR.  Pt also reports change in medication recently which was causing her dizziness however improved once meds changed.  Pt reported dizziness with positional changes today (requested RN perform orthostatics later).  Attempted vestibular testing.  No nystagmus observed with gaze holding.  Pt reports slight dizziness with head shaking.  No symptoms or nystagmus with saccades.  Modified dix hallpike performed, and pt did not present with nystagmus however reports dizziness symptoms with right sided testing and with increased vertigo/upper body swaying and eyes closed upon return to upright sitting.  Pt very hesitant to mobilize however did agree to stand however with increased symptoms upon standing, pt assisted back to supine.  Son present and reports he can assist at home.  Unclear at this time if symptoms are medical, vertigo, and/or combination.   HR during session 95-109 bpm.     Follow Up Recommendations Home health PT;Supervision/Assistance - 24 hour    Equipment Recommendations  Rolling walker with 5" wheels    Recommendations for Other Services       Precautions / Restrictions Precautions Precautions: Fall      Mobility  Bed Mobility Overal bed mobility: Needs Assistance Bed Mobility: Supine to Sit;Sit to Supine     Supine to sit: Mod assist Sit to supine: Min assist   General bed mobility comments: assist due to dizziness  Transfers Overall transfer level: Needs assistance Equipment used: Rolling walker (2 wheeled) Transfers: Sit to/from Stand Sit to Stand: Mod assist         General transfer comment: assist to rise and steady, pt reports dizziness untolerable and returned to sitting  Ambulation/Gait             General Gait Details: pt felt unable, declined to try  Stairs            Wheelchair Mobility    Modified Rankin (Stroke Patients Only)       Balance Overall balance assessment: Needs assistance         Standing balance support: Bilateral upper extremity supported Standing balance-Leahy Scale: Zero Standing balance comment: unable to tolerate standing, swaying upon standing, requiring external assist               High Level Balance Comments: pt denies falls recently             Pertinent Vitals/Pain Pain Assessment: No/denies pain    Home Living Family/patient expects to be discharged to:: Private residence Living Arrangements: Children(son)  Available Help at Discharge: Family;Available 24 hours/day Type of Home: House       Home Layout: One level Home Equipment: None      Prior Function Level of Independence: Independent               Hand Dominance        Extremity/Trunk Assessment        Lower Extremity Assessment Lower Extremity Assessment: Generalized weakness       Communication   Communication: No difficulties   Cognition Arousal/Alertness: Awake/alert Behavior During Therapy: WFL for tasks assessed/performed Overall Cognitive Status: Within Functional Limits for tasks assessed                                        General Comments      Exercises     Assessment/Plan    PT Assessment Patient needs continued PT services  PT Problem List Decreased mobility;Decreased activity tolerance;Decreased balance;Decreased knowledge of use of DME       PT Treatment Interventions DME instruction;Therapeutic activities;Gait training;Therapeutic exercise;Patient/family education;Stair training;Functional mobility training;Balance training(compensation strategies, habituation exercises)    PT Goals (Current goals can be found in the Care Plan section)  Acute Rehab PT Goals PT Goal Formulation: With patient/family Time For Goal Achievement: 09/13/18 Potential to Achieve Goals: Good    Frequency Min 3X/week   Barriers to discharge        Co-evaluation               AM-PAC PT "6 Clicks" Daily Activity  Outcome Measure Difficulty turning over in bed (including adjusting bedclothes, sheets and blankets)?: A Lot Difficulty moving from lying on back to sitting on the side of the bed? : Unable Difficulty sitting down on and standing up from a chair with arms (e.g., wheelchair, bedside commode, etc,.)?: Unable Help needed moving to and from a bed to chair (including a wheelchair)?: A Lot Help needed walking in hospital room?: Total Help needed climbing 3-5 steps with a railing? : Total 6 Click Score: 8    End of Session Equipment Utilized During Treatment: Gait belt Activity Tolerance: Patient tolerated treatment well Patient left: in bed;with bed alarm set;with call bell/phone within reach;with family/visitor present Nurse Communication: Mobility status PT Visit Diagnosis: Other abnormalities of gait and mobility (R26.89);Dizziness and giddiness (R42);Unsteadiness on feet  (R26.81)    Time: 2458-0998 PT Time Calculation (min) (ACUTE ONLY): 20 min   Charges:   PT Evaluation $PT Eval Moderate Complexity: [place], PT, DPT Acute Rehabilitation Services Office: [phone] Pager: [phone]  Trena Platt 09/06/2018, 4:05 PM

## 2018-09-06 NOTE — Plan of Care (Signed)
  Problem: Education: Goal: Knowledge of General Education information will improve Description Including pain rating scale, medication(s)/side effects and non-pharmacologic comfort measures Outcome: Progressing   Problem: Clinical Measurements: Goal: Will remain free from infection Outcome: Adequate for Discharge Goal: Respiratory complications will improve Outcome: Adequate for Discharge   Problem: Nutrition: Goal: Adequate nutrition will be maintained Outcome: Adequate for Discharge   Problem: Coping: Goal: Level of anxiety will decrease Outcome: Adequate for Discharge   Problem: Elimination: Goal: Will not experience complications related to bowel motility Outcome: Adequate for Discharge Goal: Will not experience complications related to urinary retention Outcome: Adequate for Discharge   Problem: Skin Integrity: Goal: Risk for impaired skin integrity will decrease Outcome: Adequate for Discharge

## 2018-09-07 ENCOUNTER — Observation Stay (HOSPITAL_COMMUNITY): Payer: Medicare Other

## 2018-09-07 ENCOUNTER — Encounter (HOSPITAL_COMMUNITY): Payer: Self-pay

## 2018-09-07 DIAGNOSIS — E440 Moderate protein-calorie malnutrition: Secondary | ICD-10-CM | POA: Diagnosis present

## 2018-09-07 DIAGNOSIS — Z87891 Personal history of nicotine dependence: Secondary | ICD-10-CM | POA: Diagnosis not present

## 2018-09-07 DIAGNOSIS — Z6823 Body mass index (BMI) 23.0-23.9, adult: Secondary | ICD-10-CM | POA: Diagnosis not present

## 2018-09-07 DIAGNOSIS — I350 Nonrheumatic aortic (valve) stenosis: Secondary | ICD-10-CM | POA: Diagnosis not present

## 2018-09-07 DIAGNOSIS — M792 Neuralgia and neuritis, unspecified: Secondary | ICD-10-CM | POA: Diagnosis not present

## 2018-09-07 DIAGNOSIS — E785 Hyperlipidemia, unspecified: Secondary | ICD-10-CM | POA: Diagnosis present

## 2018-09-07 DIAGNOSIS — M069 Rheumatoid arthritis, unspecified: Secondary | ICD-10-CM | POA: Diagnosis present

## 2018-09-07 DIAGNOSIS — R51 Headache: Secondary | ICD-10-CM | POA: Diagnosis not present

## 2018-09-07 DIAGNOSIS — Z452 Encounter for adjustment and management of vascular access device: Secondary | ICD-10-CM | POA: Diagnosis not present

## 2018-09-07 DIAGNOSIS — Z7982 Long term (current) use of aspirin: Secondary | ICD-10-CM | POA: Diagnosis not present

## 2018-09-07 DIAGNOSIS — Z885 Allergy status to narcotic agent status: Secondary | ICD-10-CM | POA: Diagnosis not present

## 2018-09-07 DIAGNOSIS — K5720 Diverticulitis of large intestine with perforation and abscess without bleeding: Secondary | ICD-10-CM | POA: Diagnosis not present

## 2018-09-07 DIAGNOSIS — Z79890 Hormone replacement therapy (postmenopausal): Secondary | ICD-10-CM | POA: Diagnosis not present

## 2018-09-07 DIAGNOSIS — I4891 Unspecified atrial fibrillation: Secondary | ICD-10-CM | POA: Diagnosis not present

## 2018-09-07 DIAGNOSIS — Z9181 History of falling: Secondary | ICD-10-CM | POA: Diagnosis not present

## 2018-09-07 DIAGNOSIS — R109 Unspecified abdominal pain: Secondary | ICD-10-CM | POA: Diagnosis not present

## 2018-09-07 DIAGNOSIS — E039 Hypothyroidism, unspecified: Secondary | ICD-10-CM | POA: Diagnosis not present

## 2018-09-07 DIAGNOSIS — K5780 Diverticulitis of intestine, part unspecified, with perforation and abscess without bleeding: Secondary | ICD-10-CM | POA: Diagnosis present

## 2018-09-07 DIAGNOSIS — Z8249 Family history of ischemic heart disease and other diseases of the circulatory system: Secondary | ICD-10-CM | POA: Diagnosis not present

## 2018-09-07 DIAGNOSIS — Z9049 Acquired absence of other specified parts of digestive tract: Secondary | ICD-10-CM | POA: Diagnosis not present

## 2018-09-07 DIAGNOSIS — R627 Adult failure to thrive: Secondary | ICD-10-CM | POA: Diagnosis present

## 2018-09-07 DIAGNOSIS — Z880 Allergy status to penicillin: Secondary | ICD-10-CM | POA: Diagnosis not present

## 2018-09-07 DIAGNOSIS — Z79899 Other long term (current) drug therapy: Secondary | ICD-10-CM | POA: Diagnosis not present

## 2018-09-07 DIAGNOSIS — R197 Diarrhea, unspecified: Secondary | ICD-10-CM | POA: Diagnosis not present

## 2018-09-07 DIAGNOSIS — E876 Hypokalemia: Secondary | ICD-10-CM | POA: Diagnosis not present

## 2018-09-07 DIAGNOSIS — Z888 Allergy status to other drugs, medicaments and biological substances status: Secondary | ICD-10-CM | POA: Diagnosis not present

## 2018-09-07 DIAGNOSIS — I482 Chronic atrial fibrillation, unspecified: Secondary | ICD-10-CM | POA: Diagnosis not present

## 2018-09-07 DIAGNOSIS — N83202 Unspecified ovarian cyst, left side: Secondary | ICD-10-CM | POA: Diagnosis not present

## 2018-09-07 DIAGNOSIS — K5792 Diverticulitis of intestine, part unspecified, without perforation or abscess without bleeding: Secondary | ICD-10-CM | POA: Diagnosis present

## 2018-09-07 DIAGNOSIS — Z886 Allergy status to analgesic agent status: Secondary | ICD-10-CM | POA: Diagnosis not present

## 2018-09-07 DIAGNOSIS — I4821 Permanent atrial fibrillation: Secondary | ICD-10-CM | POA: Diagnosis not present

## 2018-09-07 DIAGNOSIS — I10 Essential (primary) hypertension: Secondary | ICD-10-CM | POA: Diagnosis not present

## 2018-09-07 LAB — COMPREHENSIVE METABOLIC PANEL
ALT: 7 U/L (ref 0–44)
AST: 17 U/L (ref 15–41)
Albumin: 3.1 g/dL — ABNORMAL LOW (ref 3.5–5.0)
Alkaline Phosphatase: 65 U/L (ref 38–126)
Anion gap: 10 (ref 5–15)
BUN: 14 mg/dL (ref 8–23)
CO2: 25 mmol/L (ref 22–32)
Calcium: 8.3 mg/dL — ABNORMAL LOW (ref 8.9–10.3)
Chloride: 96 mmol/L — ABNORMAL LOW (ref 98–111)
Creatinine, Ser: 0.94 mg/dL (ref 0.44–1.00)
GFR calc Af Amer: >60 mL/min (ref >60)
GFR calc non Af Amer: 52 mL/min — ABNORMAL LOW (ref >60)
Glucose, Bld: 123 mg/dL — ABNORMAL HIGH (ref 70–99)
Potassium: 3.4 mmol/L — ABNORMAL LOW (ref 3.5–5.1)
Sodium: 131 mmol/L — ABNORMAL LOW (ref 135–145)
Total Bilirubin: 0.9 mg/dL (ref 0.3–1.2)
Total Protein: 7.2 g/dL (ref 6.5–8.1)

## 2018-09-07 LAB — BLOOD CULTURE ID PANEL (REFLEXED)

## 2018-09-07 LAB — MAGNESIUM: Magnesium: 1.6 mg/dL — ABNORMAL LOW (ref 1.7–2.4)

## 2018-09-07 MED ORDER — SODIUM CHLORIDE 0.9 % IV SOLN
2.0000 g | INTRAVENOUS | Status: DC
  Administered 2018-09-07 – 2018-09-13 (×7): 2 g via INTRAVENOUS
  Filled 2018-09-07 (×7): qty 2

## 2018-09-07 MED ORDER — MAGNESIUM SULFATE 2 GM/50ML IV SOLN
2.0000 g | Freq: Once | INTRAVENOUS | Status: AC
  Administered 2018-09-07: 2 g via INTRAVENOUS
  Filled 2018-09-07: qty 50

## 2018-09-07 MED ORDER — IOHEXOL 300 MG/ML  SOLN
100.0000 mL | Freq: Once | INTRAMUSCULAR | Status: AC | PRN
  Administered 2018-09-07: 100 mL via INTRAVENOUS

## 2018-09-07 MED ORDER — SODIUM CHLORIDE 0.9 % IV SOLN
INTRAVENOUS | Status: DC
  Administered 2018-09-09 – 2018-09-11 (×5): via INTRAVENOUS

## 2018-09-07 MED ORDER — POTASSIUM CHLORIDE CRYS ER 20 MEQ PO TBCR
40.0000 meq | EXTENDED_RELEASE_TABLET | Freq: Once | ORAL | Status: AC
  Administered 2018-09-07: 40 meq via ORAL
  Filled 2018-09-07: qty 2

## 2018-09-07 MED ORDER — METRONIDAZOLE IN NACL 5-0.79 MG/ML-% IV SOLN
500.0000 mg | Freq: Three times a day (TID) | INTRAVENOUS | Status: DC
  Administered 2018-09-07 – 2018-09-09 (×7): 500 mg via INTRAVENOUS
  Filled 2018-09-07 (×7): qty 100

## 2018-09-07 MED ORDER — SODIUM CHLORIDE (PF) 0.9 % IJ SOLN
INTRAMUSCULAR | Status: AC
  Filled 2018-09-07: qty 50

## 2018-09-07 NOTE — Plan of Care (Signed)
  Problem: Education: Goal: Knowledge of General Education information will improve Description Including pain rating scale, medication(s)/side effects and non-pharmacologic comfort measures 09/07/2018 2207 by Talbert Forest, RN Outcome: Progressing 09/07/2018 2207 by Talbert Forest, RN Outcome: Progressing   Problem: Health Behavior/Discharge Planning: Goal: Ability to manage health-related needs will improve 09/07/2018 2207 by Talbert Forest, RN Outcome: Progressing 09/07/2018 2207 by Talbert Forest, RN Outcome: Progressing   Problem: Clinical Measurements: Goal: Ability to maintain clinical measurements within normal limits will improve 09/07/2018 2207 by Talbert Forest, RN Outcome: Progressing 09/07/2018 2207 by Talbert Forest, RN Outcome: Progressing Goal: Will remain free from infection 09/07/2018 2207 by Talbert Forest, RN Outcome: Progressing 09/07/2018 2207 by Talbert Forest, RN Outcome: Progressing Goal: Diagnostic test results will improve 09/07/2018 2207 by Talbert Forest, RN Outcome: Progressing 09/07/2018 2207 by Talbert Forest, RN Outcome: Progressing Goal: Respiratory complications will improve 09/07/2018 2207 by Talbert Forest, RN Outcome: Progressing 09/07/2018 2207 by Talbert Forest, RN Outcome: Progressing Goal: Cardiovascular complication will be avoided 09/07/2018 2207 by Talbert Forest, RN Outcome: Progressing 09/07/2018 2207 by Talbert Forest, RN Outcome: Progressing   Problem: Pain Managment: Goal: General experience of comfort will improve 09/07/2018 2207 by Talbert Forest, RN Outcome: Progressing 09/07/2018 2207 by Talbert Forest, RN Outcome: Progressing   Problem: Safety: Goal: Ability to remain free from injury will improve 09/07/2018 2207 by Talbert Forest, RN Outcome: Progressing 09/07/2018 2207 by Talbert Forest, RN Outcome: Progressing

## 2018-09-07 NOTE — Progress Notes (Signed)
PHARMACY - PHYSICIAN COMMUNICATION CRITICAL VALUE ALERT - BLOOD CULTURE IDENTIFICATION (BCID)  [name] is an 84 y.o. female who presented to [facility] on 09/06/2018 with a chief complaint of nausea & vomitting  Assessment:  No known source of infection, but blood cx sent for leukocytosis.   Name of physician (or Provider) Contacted: [place]  Current antibiotics: none  Changes to prescribed antibiotics recommended: Likely contaminant- no abx at this time.  MD to follow up with abdominal CT and final cx data.    Results for orders placed or performed during the hospital encounter of 09/06/18  Blood Culture ID Panel (Reflexed) (Collected: 09/06/2018  8:45 AM)  Result Value Ref Range   Enterococcus species NOT DETECTED NOT DETECTED   Listeria monocytogenes NOT DETECTED NOT DETECTED   Staphylococcus species DETECTED (A) NOT DETECTED   Staphylococcus aureus (BCID) NOT DETECTED NOT DETECTED   Methicillin resistance NOT DETECTED NOT DETECTED   Streptococcus species NOT DETECTED NOT DETECTED   Streptococcus agalactiae NOT DETECTED NOT DETECTED   Streptococcus pneumoniae NOT DETECTED NOT DETECTED   Streptococcus pyogenes NOT DETECTED NOT DETECTED   Acinetobacter baumannii NOT DETECTED NOT DETECTED   Enterobacteriaceae species NOT DETECTED NOT DETECTED   Enterobacter cloacae complex NOT DETECTED NOT DETECTED   Escherichia coli NOT DETECTED NOT DETECTED   Klebsiella oxytoca NOT DETECTED NOT DETECTED   Klebsiella pneumoniae NOT DETECTED NOT DETECTED   Proteus species NOT DETECTED NOT DETECTED   Serratia marcescens NOT DETECTED NOT DETECTED   Haemophilus influenzae NOT DETECTED NOT DETECTED   Neisseria meningitidis NOT DETECTED NOT DETECTED   Pseudomonas aeruginosa NOT DETECTED NOT DETECTED   Candida albicans NOT DETECTED NOT DETECTED   Candida glabrata NOT DETECTED NOT DETECTED   Candida krusei NOT DETECTED NOT DETECTED   Candida parapsilosis NOT DETECTED NOT DETECTED   Candida tropicalis NOT DETECTED NOT DETECTED    Biagio Borg 09/07/2018  9:57 AM
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
## 2018-09-07 NOTE — Progress Notes (Signed)
PT Cancellation Note  Patient Details Name: Emonnie Mcdonald MRN: [id number] DOB: 08-20-1927   Cancelled Treatment:    Reason Eval/Treat Not Completed: Other (comment).  Pt reports she is still having dizziness and cannot get up.  Will try again on Monday.   Ramond Dial 09/07/2018, 12:08 PM   Mee Hives, PT MS Acute Rehab Dept. Number: [place] [facility] [place]

## 2018-09-08 LAB — CBC
HCT: 38.3 % (ref 36.0–46.0)
Hemoglobin: 11.7 g/dL — ABNORMAL LOW (ref 12.0–15.0)
MCH: 26.3 pg (ref 26.0–34.0)
MCHC: 30.5 g/dL (ref 30.0–36.0)
MCV: 86.1 fL (ref 80.0–100.0)
Platelets: 382 10*3/uL (ref 150–400)
RBC: 4.45 MIL/uL (ref 3.87–5.11)
RDW: 15.9 % — ABNORMAL HIGH (ref 11.5–15.5)
WBC: 27 10*3/uL — ABNORMAL HIGH (ref 4.0–10.5)
nRBC: 0 % (ref 0.0–0.2)

## 2018-09-08 LAB — BASIC METABOLIC PANEL
Anion gap: 11 (ref 5–15)
BUN: 15 mg/dL (ref 8–23)
CO2: 21 mmol/L — ABNORMAL LOW (ref 22–32)
Calcium: 8 mg/dL — ABNORMAL LOW (ref 8.9–10.3)
Chloride: 98 mmol/L (ref 98–111)
Creatinine, Ser: 0.97 mg/dL (ref 0.44–1.00)
GFR calc Af Amer: 58 mL/min — ABNORMAL LOW (ref >60)
GFR calc non Af Amer: 50 mL/min — ABNORMAL LOW (ref >60)
Glucose, Bld: 120 mg/dL — ABNORMAL HIGH (ref 70–99)
Potassium: 3.6 mmol/L (ref 3.5–5.1)
Sodium: 130 mmol/L — ABNORMAL LOW (ref 135–145)

## 2018-09-08 MED ORDER — MAGNESIUM SULFATE 2 GM/50ML IV SOLN
2.0000 g | Freq: Once | INTRAVENOUS | Status: AC
  Administered 2018-09-08: 2 g via INTRAVENOUS
  Filled 2018-09-08: qty 50

## 2018-09-08 MED ORDER — POTASSIUM CHLORIDE CRYS ER 20 MEQ PO TBCR
40.0000 meq | EXTENDED_RELEASE_TABLET | Freq: Once | ORAL | Status: AC
  Administered 2018-09-08: 40 meq via ORAL
  Filled 2018-09-08: qty 2

## 2018-09-08 NOTE — Consult Note (Signed)
Reason for Consult: Diverticulitis Referring Physician: Loleta Books MD  [name] is an 84 y.o. female.  HPI: Pleasant 84 year old female with a history of diverticulitis.  She was treated last month medically.  She is never had complete resolution of her symptoms.  She was admitted 2 days ago with exacerbation of her diverticulitis.  CT scan shows microperforation and multiple complex fluid collections.  She denies pain.  Her biggest complaint is diarrhea.  She has no nausea vomiting is tolerating a diet her bowels are moving but are loose.  No fever or chills but she does have a leukocytosis.  Past Medical History:  Diagnosis Date  . A-fib ([place])   . Aortic stenosis, moderate    last echo in 2010; AVA 1.1; mild AS per echo in June 2013  . Edema of both legs    s/p laser treatment per Dr. Donnetta Hutching  . Hiatal hernia   . History of chicken pox   . HLD (hyperlipidemia)   . HTN (hypertension)   . Hypothyroidism   . IBS (irritable bowel syndrome)   . Iron deficiency anemia   . Neuropathic pain 09/06/2015  . Rheumatoid arthritis [facility])     Past Surgical History:  Procedure Laterality Date  . APPENDECTOMY  1955  . CARPAL TUNNEL RELEASE Right   . CATARACT EXTRACTION    . ENDOSCOPIC VEIN LASER TREATMENT    . ENDOVENOUS ABLATION SAPHENOUS VEIN W/ LASER  08-29-2012   left greater saphenous vein   Sherren Mocha Early MD  . ENDOVENOUS ABLATION SAPHENOUS VEIN W/ LASER  09-19-2012   right greater saphenous vein by Curt Jews MD  . EYE SURGERY  2005   Bilateral cataract  . FOOT SURGERY  2003   bilateral Hammer Toe  . stab phlebectomy Right 01-02-2013   10-15 incisions right thigh and calf by Curt Jews MD    Family History  Problem Relation Age of Onset  . Heart disease Mother   . Peripheral vascular disease Mother   . Heart disease Father   . Peripheral vascular disease Father        Right leg amputation  . COPD Father   . Heart disease Brother 71       Heart Disease before age 53  .  Heart attack Brother   . Peripheral vascular disease Unknown   . Hypertension Sister     Social History:  reports that she quit smoking about 34 years ago. Her smoking use included cigarettes. She has never used smokeless tobacco. She reports that she does not drink alcohol or use drugs.  Allergies:  Allergies  Allergen Reactions  . Infliximab Other (See Comments)    REACTION: "Enlarged intestines and hernia. Also pushed intestines on lower part of lungs." REACTION: "Enlarged intestines and hernia. Also pushed intestines on lower part of lungs."  . Aspirin Nausea And Vomiting    Low Dose is ok Stomach problems  . Carvedilol Other (See Comments)    Fatigue/ ill Fatigue/ ill  . Codeine Nausea And Vomiting    "Deathly Sick"  . Penicillins Rash    Has patient had a PCN reaction causing immediate rash, facial/tongue/throat swelling, SOB or lightheadedness with hypotension: Y Has patient had a PCN reaction causing severe rash involving mucus membranes or skin necrosis: Y Has patient had a PCN reaction that required hospitalization: N Has patient had a PCN reaction occurring within the last 10 years: N If all of the above answers are "NO", then may proceed with Cephalosporin use.  Medications: I have reviewed the patient's current medications.  Results for orders placed or performed during the hospital encounter of 09/06/18 (from the past 48 hour(s))  Magnesium     Status: Abnormal   Collection Time: 09/07/18  3:49 AM  Result Value Ref Range   Magnesium 1.6 (L) 1.7 - 2.4 mg/dL    Comment: Performed at [facility], [place] [address], [place], [place] [address]  Comprehensive metabolic panel     Status: Abnormal   Collection Time: 09/07/18  3:52 AM  Result Value Ref Range   Sodium 131 (L) 135 - 145 mmol/L   Potassium 3.4 (L) 3.5 - 5.1 mmol/L   Chloride 96 (L) 98 - 111 mmol/L   CO2 25 22 - 32 mmol/L   Glucose, Bld 123 (H) 70 - 99 mg/dL   BUN 14 8 - 23 mg/dL    Creatinine, Ser 0.94 0.44 - 1.00 mg/dL   Calcium 8.3 (L) 8.9 - 10.3 mg/dL   Total Protein 7.2 6.5 - 8.1 g/dL   Albumin 3.1 (L) 3.5 - 5.0 g/dL   AST 17 15 - 41 U/L   ALT 7 0 - 44 U/L   Alkaline Phosphatase 65 38 - 126 U/L   Total Bilirubin 0.9 0.3 - 1.2 mg/dL   GFR calc non Af Amer 52 (L) >60 mL/min   GFR calc Af Amer >60 >60 mL/min    Comment: (NOTE) The eGFR has been calculated using the CKD EPI equation. This calculation has not been validated in all clinical situations. eGFR's persistently <60 mL/min signify possible Chronic Kidney Disease.    Anion gap 10 5 - 15    Comment: Performed at [facility], [place] [address] [place], [place] [address]  CBC     Status: Abnormal   Collection Time: 09/08/18  4:49 AM  Result Value Ref Range   WBC 27.0 (H) 4.0 - 10.5 K/uL   RBC 4.45 3.87 - 5.11 MIL/uL   Hemoglobin 11.7 (L) 12.0 - 15.0 g/dL   HCT 38.3 36.0 - 46.0 %   MCV 86.1 80.0 - 100.0 fL   MCH 26.3 26.0 - 34.0 pg   MCHC 30.5 30.0 - 36.0 g/dL   RDW 15.9 (H) 11.5 - 15.5 %   Platelets 382 150 - 400 K/uL   nRBC 0.0 0.0 - 0.2 %    Comment: Performed at [facility], [place] [address] [place], [place] [address]  Basic metabolic panel     Status: Abnormal   Collection Time: 09/08/18  4:49 AM  Result Value Ref Range   Sodium 130 (L) 135 - 145 mmol/L   Potassium 3.6 3.5 - 5.1 mmol/L   Chloride 98 98 - 111 mmol/L   CO2 21 (L) 22 - 32 mmol/L   Glucose, Bld 120 (H) 70 - 99 mg/dL   BUN 15 8 - 23 mg/dL   Creatinine, Ser 0.97 0.44 - 1.00 mg/dL   Calcium 8.0 (L) 8.9 - 10.3 mg/dL   GFR calc non Af Amer 50 (L) >60 mL/min   GFR calc Af Amer 58 (L) >60 mL/min    Comment: (NOTE) The eGFR has been calculated using the CKD EPI equation. This calculation has not been validated in all clinical situations. eGFR's persistently <60 mL/min signify possible Chronic Kidney Disease.    Anion gap 11 5 - 15    Comment: Performed at [facility], [place] [address], [place],  [address]    Ct  Abdomen Pelvis W Contrast  Result Date: 09/07/2018 CLINICAL DATA:  Evaluate for diverticulitis. Abdominal pain and nausea with diarrhea. EXAM: CT ABDOMEN AND PELVIS WITH CONTRAST TECHNIQUE: Multidetector CT imaging of the abdomen and pelvis was performed using the standard protocol following bolus administration of intravenous contrast. CONTRAST:  130m OMNIPAQUE IOHEXOL 300 MG/ML  SOLN COMPARISON:  07/31/2018 and 07/24/2018 FINDINGS: Lower chest: Small bilateral pleural effusions with associated atelectasis slightly improved. Known moderate size hiatal hernia unchanged. Stable cardiomegaly. Hepatobiliary: Stable 1 cm hypodensity over the left lobe of the liver likely a cyst or hemangioma. Gallbladder and biliary tree unremarkable. Pancreas: Normal. Spleen: Couple small splenic hypodensities unchanged likely cysts. Adrenals/Urinary Tract: Adrenal glands are normal. Kidneys are normal in size, shape and position without hydronephrosis or nephrolithiasis. Right ureter and bladder are normal. Mild stable prominence of the left ureter unchanged and likely due to the inflammatory/infectious process in the left lower quadrant/pelvis related to the colon. Stomach/Bowel: Moderate size hiatal hernia unchanged small bowel is normal. Previous appendectomy. Persistent moderate wall thickening and adjacent inflammatory change and mild free fluid involving a significant segment of sigmoid colon in the left lower quadrant/pelvis which is worse. Findings likely due to a significant acute colitis or diverticulitis. There are small air collections along the periphery of the inflamed colon several which are likely extraluminal representing microperforations and developing diverticular abscesses. Some of these peripheral air collections are due to diverticula. Extraluminal air collection between the inflamed colon and left ovary likely additional contained perforation/abscess. There are  a few adjacent small lymph nodes. Vascular/Lymphatic: Moderate calcified plaque over the abdominal aorta. Few small periaortic lymph nodes and few small lymph nodes adjacent the sigmoid colon. Reproductive: Stable 2 cm cystic structure over the left lower uterine segment unchanged. Uterus and right ovary unchanged. Persistent multi-cystic left ovary measuring 4 x 5.2 cm. Other: None. Musculoskeletal: Unchanged. IMPRESSION: Continued evidence of significant wall thickening and adjacent inflammatory change/fluid involving a moderate segment of the sigmoid colon in the left lower quadrant and pelvis. There has been interval worsening of these changes. Several adjacent air/fluid collections along the inflamed sigmoid colon suggesting micro perforations with developing diverticular abscesses. No significant free peritoneal air. Multi-cystic left ovary measuring 4 x 5.2 cm which may represent a low-grade cystic neoplasm. Stable 2 cm cystic structure along the left side of the lower uterine segment. Large hiatal hernia unchanged. Small bilateral pleural effusions with bibasilar atelectasis improved. 1 cm left liver hypodensity unchanged likely a cyst or hemangioma. Two small splenic hypodensities unchanged likely cysts. Aortic Atherosclerosis (ICD10-I70.0). Electronically Signed   By: DMarin OlpM.D.   On: 09/07/2018 11:14    Review of Systems  Constitutional: Negative for chills and fever.  Gastrointestinal: Positive for abdominal pain.  All other systems reviewed and are negative.  Blood pressure 110/66, pulse 96, temperature 98.7 F (37.1 C), temperature source Oral, resp. rate 18, height 5' (1.524 m), weight 54.3 kg, SpO2 100 %. Physical Exam  Constitutional: She is oriented to person, place, and time. She appears well-developed and well-nourished.  HENT:  Head: Normocephalic.  Neck: Normal range of motion.  Cardiovascular: Normal rate and regular rhythm.  Respiratory: Effort normal and breath sounds  normal.  GI: Soft. She exhibits distension. There is no tenderness.  Musculoskeletal: Normal range of motion.  Neurological: She is alert and oriented to person, place, and time.  Skin: Skin is warm and dry.    Assessment/Plan: Recurrent diverticulitis with microperforation and pelvic fluid collections  Discussed surgical options  with the patient.  She refuses any surgical intervention and has no interest in surgery at this point time.  She states she would rather die than have surgery.  Not sure what her endpoint will be.  May be worth consulting interventional radiology if she is interested.  We will sign off.  Reconsult if she changes her mind about surgical intervention.  Zechariah Bissonnette A Ayoub Arey 09/08/2018, 11:20 AM

## 2018-09-08 NOTE — Plan of Care (Signed)
  Problem: Education: Goal: Knowledge of General Education information will improve Description: Including pain rating scale, medication(s)/side effects and non-pharmacologic comfort measures Outcome: Progressing   Problem: Health Behavior/Discharge Planning: Goal: Ability to manage health-related needs will improve Outcome: Progressing   Problem: Clinical Measurements: Goal: Ability to maintain clinical measurements within normal limits will improve Outcome: Progressing Goal: Will remain free from infection Outcome: Progressing Goal: Diagnostic test results will improve Outcome: Progressing Goal: Respiratory complications will improve Outcome: Progressing Goal: Cardiovascular complication will be avoided Outcome: Progressing   Problem: Pain Managment: Goal: General experience of comfort will improve Outcome: Progressing   Problem: Safety: Goal: Ability to remain free from injury will improve Outcome: Progressing   
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
## 2018-09-09 ENCOUNTER — Inpatient Hospital Stay: Payer: Self-pay

## 2018-09-09 DIAGNOSIS — N83202 Unspecified ovarian cyst, left side: Secondary | ICD-10-CM

## 2018-09-09 DIAGNOSIS — Z87891 Personal history of nicotine dependence: Secondary | ICD-10-CM

## 2018-09-09 DIAGNOSIS — Z880 Allergy status to penicillin: Secondary | ICD-10-CM

## 2018-09-09 DIAGNOSIS — K5792 Diverticulitis of intestine, part unspecified, without perforation or abscess without bleeding: Secondary | ICD-10-CM

## 2018-09-09 DIAGNOSIS — Z888 Allergy status to other drugs, medicaments and biological substances status: Secondary | ICD-10-CM

## 2018-09-09 DIAGNOSIS — R51 Headache: Secondary | ICD-10-CM

## 2018-09-09 DIAGNOSIS — Z885 Allergy status to narcotic agent status: Secondary | ICD-10-CM

## 2018-09-09 DIAGNOSIS — Z886 Allergy status to analgesic agent status: Secondary | ICD-10-CM

## 2018-09-09 LAB — BASIC METABOLIC PANEL
Anion gap: 8 (ref 5–15)
BUN: 14 mg/dL (ref 8–23)
CO2: 24 mmol/L (ref 22–32)
Calcium: 7.9 mg/dL — ABNORMAL LOW (ref 8.9–10.3)
Chloride: 102 mmol/L (ref 98–111)
Creatinine, Ser: 0.95 mg/dL (ref 0.44–1.00)
GFR calc Af Amer: 60 mL/min — ABNORMAL LOW (ref >60)
GFR calc non Af Amer: 52 mL/min — ABNORMAL LOW (ref >60)
Glucose, Bld: 112 mg/dL — ABNORMAL HIGH (ref 70–99)
Potassium: 3.3 mmol/L — ABNORMAL LOW (ref 3.5–5.1)
Sodium: 134 mmol/L — ABNORMAL LOW (ref 135–145)

## 2018-09-09 LAB — CBC
HCT: 33.8 % — ABNORMAL LOW (ref 36.0–46.0)
Hemoglobin: 10.4 g/dL — ABNORMAL LOW (ref 12.0–15.0)
MCH: 27 pg (ref 26.0–34.0)
MCHC: 30.8 g/dL (ref 30.0–36.0)
MCV: 87.8 fL (ref 80.0–100.0)
Platelets: 359 10*3/uL (ref 150–400)
RBC: 3.85 MIL/uL — ABNORMAL LOW (ref 3.87–5.11)
RDW: 16 % — ABNORMAL HIGH (ref 11.5–15.5)
WBC: 17.3 10*3/uL — ABNORMAL HIGH (ref 4.0–10.5)
nRBC: 0 % (ref 0.0–0.2)

## 2018-09-09 MED ORDER — OXYCODONE HCL 5 MG PO TABS: 2.5000 mg | ORAL_TABLET | ORAL | Status: DC | PRN

## 2018-09-09 MED ORDER — MORPHINE SULFATE (PF) 2 MG/ML IV SOLN: 2.0000 mg | Freq: Once | INTRAVENOUS | Status: DC

## 2018-09-09 MED ORDER — TRAMADOL HCL 50 MG PO TABS
50.0000 mg | ORAL_TABLET | Freq: Two times a day (BID) | ORAL | Status: DC | PRN
  Administered 2018-09-09: 50 mg via ORAL
  Filled 2018-09-09 (×2): qty 1

## 2018-09-09 MED ORDER — ACETAMINOPHEN 325 MG PO TABS: 650.0000 mg | ORAL_TABLET | Freq: Four times a day (QID) | ORAL | Status: DC | PRN

## 2018-09-09 MED ORDER — METRONIDAZOLE 500 MG PO TABS
500.0000 mg | ORAL_TABLET | Freq: Three times a day (TID) | ORAL | Status: DC
  Administered 2018-09-09 – 2018-09-13 (×12): 500 mg via ORAL
  Filled 2018-09-09 (×13): qty 1

## 2018-09-09 NOTE — Plan of Care (Signed)
  Problem: Education: Goal: Knowledge of General Education information will improve Description: Including pain rating scale, medication(s)/side effects and non-pharmacologic comfort measures Outcome: Progressing   Problem: Health Behavior/Discharge Planning: Goal: Ability to manage health-related needs will improve Outcome: Progressing   Problem: Clinical Measurements: Goal: Ability to maintain clinical measurements within normal limits will improve Outcome: Progressing Goal: Will remain free from infection Outcome: Progressing Goal: Diagnostic test results will improve Outcome: Progressing Goal: Respiratory complications will improve Outcome: Progressing Goal: Cardiovascular complication will be avoided Outcome: Progressing   Problem: Activity: Goal: Risk for activity intolerance will decrease Outcome: Progressing   Problem: Pain Managment: Goal: General experience of comfort will improve Outcome: Progressing   Problem: Safety: Goal: Ability to remain free from injury will improve Outcome: Progressing   
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
## 2018-09-09 NOTE — Progress Notes (Signed)
Physical Therapy Treatment Patient Details Name: [name] MRN: [id number] DOB: [birth year] Today's Date: 09/09/2018    History of Present Illness 84 y.o. female with medical history significant of A. fib not on anticoagulation, aortic stenosis, hypertension, hypothyroidism, OA, RA, neuropathy who presents to the emergency department from home with complaints of dizziness, nausea and found to be in afib with RVR.  Also with acute diverticulitis    PT Comments    Orthostatic vitals obtained during mobility.  Pt also able to ambulate within room today.  Pt has not received Meclizine since 09/06/18 per chart however tolerated mobility better today then previous visit.  RN in room (giving Ultram) and reminded pt that Meclizine is PRN and to ask for medicine if needed.    09/09/18 1100  Vital Signs  Pulse Rate Source Dinamap  BP Location Left Arm  BP Method Automatic  Patient Position (if appropriate) Orthostatic Vitals  Orthostatic Lying   BP- Lying 140/80  Pulse- Lying 86  Orthostatic Sitting  BP- Sitting 153/72  Pulse- Sitting 88  Orthostatic Standing at 0 minutes  BP- Standing at 0 minutes 141/74  Pulse- Standing at 0 minutes 99     Follow Up Recommendations  Home health PT;Supervision/Assistance - 24 hour     Equipment Recommendations  Rolling walker with 5" wheels    Recommendations for Other Services       Precautions / Restrictions Precautions Precautions: Fall Restrictions Weight Bearing Restrictions: No    Mobility  Bed Mobility Overal bed mobility: Needs Assistance Bed Mobility: Supine to Sit     Supine to sit: Min assist     General bed mobility comments: assist for trunk upright, pt reports mild dizziness, resolved quickly  Transfers Overall transfer level: Needs assistance Equipment used: Rolling walker (2 wheeled) Transfers: Sit to/from Stand Sit to Stand: Min assist         General transfer comment: assist to rise and steady,  verbal cues for hand placement  Ambulation/Gait Ambulation/Gait assistance: Min guard;Min assist Gait Distance (Feet): 40 Feet Assistive device: Rolling walker (2 wheeled) Gait Pattern/deviations: Step-through pattern;Decreased stride length     General Gait Details: pt reports mild dizziness "comes and goes" during ambulation, ambulated within room per pt preference, encouraged to remain up in recliner after ambulating   Stairs             Wheelchair Mobility    Modified Rankin (Stroke Patients Only)       Balance                                            Cognition Arousal/Alertness: Awake/alert Behavior During Therapy: WFL for tasks assessed/performed Overall Cognitive Status: Within Functional Limits for tasks assessed                                        Exercises      General Comments        Pertinent Vitals/Pain Pain Assessment: Faces Faces Pain Scale: Hurts little more Pain Location: abdomen Pain Descriptors / Indicators: Aching Pain Intervention(s): Repositioned;Limited activity within patient's tolerance;Monitored during session;RN gave pain meds during session    Home Living  Prior Function            PT Goals (current goals can now be found in the care plan section) Progress towards PT goals: Progressing toward goals    Frequency    Min 3X/week      PT Plan Current plan remains appropriate    Co-evaluation              AM-PAC PT "6 Clicks" Daily Activity  Outcome Measure  Difficulty turning over in bed (including adjusting bedclothes, sheets and blankets)?: A Lot Difficulty moving from lying on back to sitting on the side of the bed? : Unable Difficulty sitting down on and standing up from a chair with arms (e.g., wheelchair, bedside commode, etc,.)?: Unable Help needed moving to and from a bed to chair (including a wheelchair)?: A Little Help needed  walking in hospital room?: A Little Help needed climbing 3-5 steps with a railing? : A Lot 6 Click Score: 12    End of Session Equipment Utilized During Treatment: Gait belt Activity Tolerance: Patient tolerated treatment well Patient left: in chair;with chair alarm set;with call bell/phone within reach;with nursing/sitter in room Nurse Communication: Mobility status PT Visit Diagnosis: Other abnormalities of gait and mobility (R26.89);Unsteadiness on feet (R26.81)     Time: 1035-1100 PT Time Calculation (min) (ACUTE ONLY): 25 min  Charges:  $Gait Training: 8-22 mins $Therapeutic Activity: 8-22 mins                     Carmelia Bake, PT, DPT Acute Rehabilitation Services Office: [phone] Pager: [phone]  Trena Platt 09/09/2018, 11:51 AM
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
## 2018-09-09 NOTE — Final Consult Note (Signed)
[facility] Progress Note     Subjective: CC: abdominal pain Patient reports crampy abdominal pain in lower abdomen. Tolerating CLD but with occasional nausea. No emesis. Having bowel movements, loose. Discussed surgical intervention with patient who reports that she does not want surgery at all. Discussed that if she worsened and infection were not controlled and she refuses surgery that she may ultimately become more ill and pass away. She states understanding to this and still remains adamant that she wants no surgical interventions. She did report that she was open to considering a drain if she develops a localized fluid collection amenable to drainage.   Objective: Vital signs in last 24 hours: Temp:  [98.7 F (37.1 C)-99.6 F (37.6 C)] 99.4 F (37.4 C) (11/11 0446) Pulse Rate:  [86-98] 98 (11/11 0446) Resp:  [16] 16 (11/11 0446) BP: (125-132)/(59-71) 128/70 (11/11 0446) SpO2:  [99 %-100 %] 100 % (11/11 0446) Last BM Date: 09/08/18  Intake/Output from previous day: 11/10 0701 - 11/11 0700 In: 2145.1 [I.V.:1722.2; IV Piggyback:422.9] Out: -  Intake/Output this shift: No intake/output data recorded.  PE: Gen:  Alert, NAD, pleasant Card:  Regular rate and rhythm, radial pulses 2+ BL Pulm:  Normal effort, clear to auscultation bilaterally Abd: Soft, mildly TTP in lower abdomen, mildly distended, bowel sounds present Skin: warm and dry, no rashes  Psych: A&Ox3   Lab Results:  Recent Labs    09/08/18 0449 09/09/18 0455  WBC 27.0* 17.3*  HGB 11.7* 10.4*  HCT 38.3 33.8*  PLT 382 359   BMET Recent Labs    09/08/18 0449 09/09/18 0455  NA 130* 134*  K 3.6 3.3*  CL 98 102  CO2 21* 24  GLUCOSE 120* 112*  BUN 15 14  CREATININE 0.97 0.95  CALCIUM 8.0* 7.9*   PT/INR No results for input(s): LABPROT, INR in the last 72 hours. CMP     Component Value Date/Time   NA 134 (L) 09/09/2018 0455   K 3.3 (L) 09/09/2018 0455   CL 102 09/09/2018 0455   CO2 24  09/09/2018 0455   GLUCOSE 112 (H) 09/09/2018 0455   BUN 14 09/09/2018 0455   CREATININE 0.95 09/09/2018 0455   CREATININE 0.92 (H) 08/09/2018 1511   CALCIUM 7.9 (L) 09/09/2018 0455   PROT 7.2 09/07/2018 0352   ALBUMIN 3.1 (L) 09/07/2018 0352   AST 17 09/07/2018 0352   ALT 7 09/07/2018 0352   ALKPHOS 65 09/07/2018 0352   BILITOT 0.9 09/07/2018 0352   GFRNONAA 52 (L) 09/09/2018 0455   GFRAA 60 (L) 09/09/2018 0455   Lipase     Component Value Date/Time   LIPASE 45 07/24/2018 1656       Studies/Results: Ct Abdomen Pelvis W Contrast  Result Date: 09/07/2018 CLINICAL DATA:  Evaluate for diverticulitis. Abdominal pain and nausea with diarrhea. EXAM: CT ABDOMEN AND PELVIS WITH CONTRAST TECHNIQUE: Multidetector CT imaging of the abdomen and pelvis was performed using the standard protocol following bolus administration of intravenous contrast. CONTRAST:  181mL OMNIPAQUE IOHEXOL 300 MG/ML  SOLN COMPARISON:  07/31/2018 and 07/24/2018 FINDINGS: Lower chest: Small bilateral pleural effusions with associated atelectasis slightly improved. Known moderate size hiatal hernia unchanged. Stable cardiomegaly. Hepatobiliary: Stable 1 cm hypodensity over the left lobe of the liver likely a cyst or hemangioma. Gallbladder and biliary tree unremarkable. Pancreas: Normal. Spleen: Couple small splenic hypodensities unchanged likely cysts. Adrenals/Urinary Tract: Adrenal glands are normal. Kidneys are normal in size, shape and position without hydronephrosis or nephrolithiasis. Right ureter and  bladder are normal. Mild stable prominence of the left ureter unchanged and likely due to the inflammatory/infectious process in the left lower quadrant/pelvis related to the colon. Stomach/Bowel: Moderate size hiatal hernia unchanged small bowel is normal. Previous appendectomy. Persistent moderate wall thickening and adjacent inflammatory change and mild free fluid involving a significant segment of sigmoid colon in the  left lower quadrant/pelvis which is worse. Findings likely due to a significant acute colitis or diverticulitis. There are small air collections along the periphery of the inflamed colon several which are likely extraluminal representing microperforations and developing diverticular abscesses. Some of these peripheral air collections are due to diverticula. Extraluminal air collection between the inflamed colon and left ovary likely additional contained perforation/abscess. There are a few adjacent small lymph nodes. Vascular/Lymphatic: Moderate calcified plaque over the abdominal aorta. Few small periaortic lymph nodes and few small lymph nodes adjacent the sigmoid colon. Reproductive: Stable 2 cm cystic structure over the left lower uterine segment unchanged. Uterus and right ovary unchanged. Persistent multi-cystic left ovary measuring 4 x 5.2 cm. Other: None. Musculoskeletal: Unchanged. IMPRESSION: Continued evidence of significant wall thickening and adjacent inflammatory change/fluid involving a moderate segment of the sigmoid colon in the left lower quadrant and pelvis. There has been interval worsening of these changes. Several adjacent air/fluid collections along the inflamed sigmoid colon suggesting micro perforations with developing diverticular abscesses. No significant free peritoneal air. Multi-cystic left ovary measuring 4 x 5.2 cm which may represent a low-grade cystic neoplasm. Stable 2 cm cystic structure along the left side of the lower uterine segment. Large hiatal hernia unchanged. Small bilateral pleural effusions with bibasilar atelectasis improved. 1 cm left liver hypodensity unchanged likely a cyst or hemangioma. Two small splenic hypodensities unchanged likely cysts. Aortic Atherosclerosis (ICD10-I70.0). Electronically Signed   By: Marin Olp M.D.   On: 09/07/2018 11:14    Anti-infectives: Anti-infectives (From admission, onward)   Start     Dose/Rate Route Frequency Ordered Stop    09/07/18 1145  cefTRIAXone (ROCEPHIN) 2 g in sodium chloride 0.9 % 100 mL IVPB     2 g 200 mL/hr over 30 Minutes Intravenous Every 24 hours 09/07/18 1146     09/07/18 1145  metroNIDAZOLE (FLAGYL) IVPB 500 mg     500 mg 100 mL/hr over 60 Minutes Intravenous Every 8 hours 09/07/18 1146         Assessment/Plan Rheumatoid arthritis HTN HLD Moderate aortic stenosis Atrial fibrillation Hypothyroidism Hiatal hernia  Recurrent diverticulitis with microperforation and pelvic fluid collections - WBC 17 from 27, afebrile - recommend CLD until pain improved and WBC closer to normal range - Continue IV abx and transition to PO abx when tolerating soft diet - patient likely needs to be on chronic PO abx given that this is a recurrent issue and she has no interest in surgical management - low fiber diet while acutely ill, high fiber diet when improved for 6 weeks  FEN: CLD, IVF VTE: SCDs, lovenox ID: [email or web address] 11/9>>  Patient declines surgical treatment at this time, we discussed at length the meaning of this in the setting that she becomes sicker. She is unsure of whether she would consider IR drainage if a diverticular abscess develops. We will sign off at this time since no surgical intervention is needed/desired.   LOS: 2 days    Brigid Re , [facility] 09/09/2018, 10:09 AM Pager: [phone] Consults: [phone] Mon-Fri 7:00 am-4:30 pm Sat-Sun 7:00 am-11:30 am

## 2018-09-10 ENCOUNTER — Inpatient Hospital Stay: Payer: Self-pay

## 2018-09-10 ENCOUNTER — Ambulatory Visit: Payer: Medicare Other | Admitting: Neurology

## 2018-09-10 DIAGNOSIS — I482 Chronic atrial fibrillation, unspecified: Secondary | ICD-10-CM

## 2018-09-10 LAB — BASIC METABOLIC PANEL
Anion gap: 7 (ref 5–15)
BUN: 11 mg/dL (ref 8–23)
CO2: 23 mmol/L (ref 22–32)
Calcium: 7.6 mg/dL — ABNORMAL LOW (ref 8.9–10.3)
Chloride: 106 mmol/L (ref 98–111)
Creatinine, Ser: 0.86 mg/dL (ref 0.44–1.00)
GFR calc Af Amer: >60 mL/min (ref >60)
GFR calc non Af Amer: 58 mL/min — ABNORMAL LOW (ref >60)
Glucose, Bld: 85 mg/dL (ref 70–99)
Potassium: 3.2 mmol/L — ABNORMAL LOW (ref 3.5–5.1)
Sodium: 136 mmol/L (ref 135–145)

## 2018-09-10 LAB — CBC
HCT: 30.9 % — ABNORMAL LOW (ref 36.0–46.0)
Hemoglobin: 9.3 g/dL — ABNORMAL LOW (ref 12.0–15.0)
MCH: 26.9 pg (ref 26.0–34.0)
MCHC: 30.1 g/dL (ref 30.0–36.0)
MCV: 89.3 fL (ref 80.0–100.0)
Platelets: 320 10*3/uL (ref 150–400)
RBC: 3.46 MIL/uL — ABNORMAL LOW (ref 3.87–5.11)
RDW: 16 % — ABNORMAL HIGH (ref 11.5–15.5)
WBC: 10.8 10*3/uL — ABNORMAL HIGH (ref 4.0–10.5)
nRBC: 0 % (ref 0.0–0.2)

## 2018-09-10 LAB — CULTURE, BLOOD (ROUTINE X 2)

## 2018-09-10 NOTE — Progress Notes (Signed)
PHARMACY CONSULT NOTE FOR:  OUTPATIENT  PARENTERAL ANTIBIOTIC THERAPY (OPAT)  Indication: diverticular abscesses  Regimen: ceftriaxone 2gm IV q24h End date: 10/21/18  IV antibiotic discharge orders are pended. To discharging provider:  please sign these orders via discharge navigator,  Select New Orders & click on the button choice - Manage This Unsigned Work.     Thank you for allowing pharmacy to be a part of this patient's care.  Lynelle Doctor 09/10/2018, 12:59 PM

## 2018-09-10 NOTE — Plan of Care (Signed)
  Problem: Clinical Measurements: Goal: Will remain free from infection Outcome: Progressing   Problem: Activity: Goal: Risk for activity intolerance will decrease Outcome: Progressing   Problem: Safety: Goal: Ability to remain free from injury will improve Outcome: Progressing   Problem: Education: Goal: Knowledge of General Education information will improve Description Including pain rating scale, medication(s)/side effects and non-pharmacologic comfort measures Outcome: Adequate for Discharge   Problem: Health Behavior/Discharge Planning: Goal: Ability to manage health-related needs will improve Outcome: Adequate for Discharge   Problem: Clinical Measurements: Goal: Ability to maintain clinical measurements within normal limits will improve Outcome: Adequate for Discharge Goal: Diagnostic test results will improve Outcome: Adequate for Discharge Goal: Respiratory complications will improve Outcome: Adequate for Discharge Goal: Cardiovascular complication will be avoided Outcome: Adequate for Discharge   Problem: Pain Managment: Goal: General experience of comfort will improve Outcome: Adequate for Discharge
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
## 2018-09-10 NOTE — Progress Notes (Signed)
      INFECTIOUS DISEASE ATTENDING ADDENDUM:   Date: 09/10/2018  Patient name: [name]  Medical record number: [id number]  Date of birth: 06-07-1927   Diagnosis: Severe diverticulitis with likely early abscess formation   Culture Result: None  Allergies  Allergen Reactions  . Infliximab Other (See Comments)    REACTION: "Enlarged intestines and hernia. Also pushed intestines on lower part of lungs." REACTION: "Enlarged intestines and hernia. Also pushed intestines on lower part of lungs."  . Aspirin Nausea And Vomiting    Low Dose is ok Stomach problems  . Carvedilol Other (See Comments)    Fatigue/ ill Fatigue/ ill  . Codeine Nausea And Vomiting    "Deathly Sick"  . Penicillins Rash    Has patient had a PCN reaction causing immediate rash, facial/tongue/throat swelling, SOB or lightheadedness with hypotension: Y Has patient had a PCN reaction causing severe rash involving mucus membranes or skin necrosis: Y Has patient had a PCN reaction that required hospitalization: N Has patient had a PCN reaction occurring within the last 10 years: N If all of the above answers are "NO", then may proceed with Cephalosporin use.     OPAT Orders Discharge antibiotics: 2 g IV ceftriaxone daily with 500 mg of metronidazole 3 times daily orally ) Duration: At least 4 weeks and until we have seen resolution of this process on repeat CT scan End Date:  December 23 I have put a tentative this continues to date on the 23 but I will stop the antibiotics before then if she has had repeat imaging showing resolution of her diverticulitis  [facility] Care Per Protocol:  Labs weekly while on IV antibiotics: _x_ CBC with differential _x_ BMP   __ Please pull PIC at completion of IV antibiotics x__ Please leave PIC in place until doctor has seen patient or been notified  Fax weekly labs to [phone]  Clinic Follow Up Appt:  December 13   Alcide Evener 09/10/2018,  10:21 AM
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
## 2018-09-11 DIAGNOSIS — I4821 Permanent atrial fibrillation: Secondary | ICD-10-CM

## 2018-09-11 DIAGNOSIS — I10 Essential (primary) hypertension: Secondary | ICD-10-CM

## 2018-09-11 DIAGNOSIS — E876 Hypokalemia: Secondary | ICD-10-CM

## 2018-09-11 DIAGNOSIS — I4891 Unspecified atrial fibrillation: Secondary | ICD-10-CM

## 2018-09-11 DIAGNOSIS — E039 Hypothyroidism, unspecified: Secondary | ICD-10-CM

## 2018-09-11 LAB — BASIC METABOLIC PANEL
Anion gap: 8 (ref 5–15)
Anion gap: 10 (ref 5–15)
BUN: 9 mg/dL (ref 8–23)
BUN: 6 mg/dL — ABNORMAL LOW (ref 8–23)
CO2: 23 mmol/L (ref 22–32)
CO2: 23 mmol/L (ref 22–32)
Calcium: 7.3 mg/dL — ABNORMAL LOW (ref 8.9–10.3)
Calcium: 8.1 mg/dL — ABNORMAL LOW (ref 8.9–10.3)
Chloride: 104 mmol/L (ref 98–111)
Chloride: 102 mmol/L (ref 98–111)
Creatinine, Ser: 0.76 mg/dL (ref 0.44–1.00)
Creatinine, Ser: 0.76 mg/dL (ref 0.44–1.00)
GFR calc Af Amer: >60 mL/min (ref >60)
GFR calc Af Amer: >60 mL/min (ref >60)
GFR calc non Af Amer: >60 mL/min (ref >60)
GFR calc non Af Amer: >60 mL/min (ref >60)
Glucose, Bld: 90 mg/dL (ref 70–99)
Glucose, Bld: 128 mg/dL — ABNORMAL HIGH (ref 70–99)
Potassium: 2.6 mmol/L — CL (ref 3.5–5.1)
Potassium: 4 mmol/L (ref 3.5–5.1)
Sodium: 135 mmol/L (ref 135–145)
Sodium: 135 mmol/L (ref 135–145)

## 2018-09-11 LAB — CBC
HCT: 30.8 % — ABNORMAL LOW (ref 36.0–46.0)
Hemoglobin: 9.5 g/dL — ABNORMAL LOW (ref 12.0–15.0)
MCH: 26.8 pg (ref 26.0–34.0)
MCHC: 30.8 g/dL (ref 30.0–36.0)
MCV: 87 fL (ref 80.0–100.0)
Platelets: 340 10*3/uL (ref 150–400)
RBC: 3.54 MIL/uL — ABNORMAL LOW (ref 3.87–5.11)
RDW: 15.7 % — ABNORMAL HIGH (ref 11.5–15.5)
WBC: 9.9 10*3/uL (ref 4.0–10.5)
nRBC: 0 % (ref 0.0–0.2)

## 2018-09-11 LAB — CULTURE, BLOOD (ROUTINE X 2): Culture: NO GROWTH

## 2018-09-11 LAB — MAGNESIUM: Magnesium: 1.6 mg/dL — ABNORMAL LOW (ref 1.7–2.4)

## 2018-09-11 MED ORDER — MAGNESIUM SULFATE 4 GM/100ML IV SOLN
4.0000 g | Freq: Once | INTRAVENOUS | Status: AC
  Administered 2018-09-11: 4 g via INTRAVENOUS
  Filled 2018-09-11: qty 100

## 2018-09-11 MED ORDER — ENOXAPARIN SODIUM 40 MG/0.4ML ~~LOC~~ SOLN
40.0000 mg | SUBCUTANEOUS | Status: DC
  Administered 2018-09-11 – 2018-09-13 (×3): 40 mg via SUBCUTANEOUS
  Filled 2018-09-11 (×3): qty 0.4

## 2018-09-11 MED ORDER — POTASSIUM CHLORIDE CRYS ER 20 MEQ PO TBCR: 40.0000 meq | EXTENDED_RELEASE_TABLET | ORAL | Status: DC

## 2018-09-11 MED ORDER — POTASSIUM CHLORIDE 20 MEQ PO PACK
40.0000 meq | PACK | Freq: Once | ORAL | Status: AC
  Administered 2018-09-11: 40 meq via ORAL
  Filled 2018-09-11: qty 2

## 2018-09-11 MED ORDER — POTASSIUM CHLORIDE CRYS ER 20 MEQ PO TBCR
40.0000 meq | EXTENDED_RELEASE_TABLET | ORAL | Status: DC
  Administered 2018-09-11 (×2): 40 meq via ORAL
  Filled 2018-09-11 (×2): qty 2

## 2018-09-11 NOTE — Progress Notes (Signed)
[facility] will provide [facility] and home health RN for home IV ABX.  If patient discharges after hours, please call [phone].   [name] 09/11/2018, 3:45 PM
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
## 2018-09-11 NOTE — Progress Notes (Signed)
Spoke with pt's son [name] via phone [phone], concerning discharge needs for pt and explained HHRN/NA/PT coming into the home with [place]. [name] (pt's son) was Faythe Ghee with that.  This CM asked if he had any other concerns and he stated no.

## 2018-09-11 NOTE — Progress Notes (Signed)
CRITICAL VALUE ALERT  Critical Value: K 2.6  Date & Time Notied:  09/11/18 9409  Provider Notified: K.Schorr   Orders Received/Actions taken: Potassium 40 Meq oral

## 2018-09-11 NOTE — Care Management Note (Signed)
Case Management Note  Patient Details  Name: [name] MRN: [id number] Date of Birth: [birth year]  Subjective/Objective:   Pt admitted with Atrial fibrillation, chronic                 Action/Plan: Home with HHRN/PT and her son.    Expected Discharge Date:                  Expected Discharge Plan:  [place]  In-House Referral:  Clinical Social Work  Discharge planning Services  CM Consult  Post Acute Care Choice:    Choice offered to:  Patient  DME Arranged:    DME Agency:     HH Arranged:  RN, Nurse's Aide [place] Agency:  [facility]  Status of Service:  Completed, signed off  If discussed at [place] of Stay Meetings, dates discussed:    Additional CommentsPurcell Mouton, RN 09/11/2018, 2:10 PM

## 2018-09-11 NOTE — Care Management Important Message (Signed)
Important Message  Patient Details  Name: [name] MRN: [id number] Date of Birth: [birth year]   Medicare Important Message Given:  Yes    Kerin Salen 09/11/2018, 11:23 [place] Message  Patient Details  Name: [name] MRN: [id number] Date of Birth: 03-27-1927   Medicare Important Message Given:  Yes    Kerin Salen 09/11/2018, 11:22 AM

## 2018-09-11 NOTE — Progress Notes (Signed)
PT Cancellation Note  Patient Details Name: [name] MRN: [id number] DOB: Jul 15, 1927   Cancelled Treatment:     "Not today" pt stated she feels bad today and worried about getting a PICC line in "sometime" today.  Pt has been evaluated with rec for [facility] .    Rica Koyanagi  PTA Acute  Rehabilitation Services Pager      [phone] Office      [phone]

## 2018-09-12 ENCOUNTER — Inpatient Hospital Stay (HOSPITAL_COMMUNITY): Payer: Medicare Other

## 2018-09-12 LAB — CBC
HCT: 31.9 % — ABNORMAL LOW (ref 36.0–46.0)
Hemoglobin: 9.8 g/dL — ABNORMAL LOW (ref 12.0–15.0)
MCH: 26.6 pg (ref 26.0–34.0)
MCHC: 30.7 g/dL (ref 30.0–36.0)
MCV: 86.7 fL (ref 80.0–100.0)
Platelets: 344 10*3/uL (ref 150–400)
RBC: 3.68 MIL/uL — ABNORMAL LOW (ref 3.87–5.11)
RDW: 15.8 % — ABNORMAL HIGH (ref 11.5–15.5)
WBC: 9.7 10*3/uL (ref 4.0–10.5)
nRBC: 0 % (ref 0.0–0.2)

## 2018-09-12 LAB — BASIC METABOLIC PANEL
Anion gap: 7 (ref 5–15)
BUN: 5 mg/dL — ABNORMAL LOW (ref 8–23)
CO2: 24 mmol/L (ref 22–32)
Calcium: 7.6 mg/dL — ABNORMAL LOW (ref 8.9–10.3)
Chloride: 100 mmol/L (ref 98–111)
Creatinine, Ser: 0.74 mg/dL (ref 0.44–1.00)
GFR calc Af Amer: >60 mL/min (ref >60)
GFR calc non Af Amer: >60 mL/min (ref >60)
Glucose, Bld: 108 mg/dL — ABNORMAL HIGH (ref 70–99)
Potassium: 3.6 mmol/L (ref 3.5–5.1)
Sodium: 131 mmol/L — ABNORMAL LOW (ref 135–145)

## 2018-09-12 LAB — MAGNESIUM: Magnesium: 1.9 mg/dL (ref 1.7–2.4)

## 2018-09-12 MED ORDER — SODIUM CHLORIDE 0.9% FLUSH
10.0000 mL | INTRAVENOUS | Status: DC | PRN
  Administered 2018-09-13: 10 mL
  Filled 2018-09-12: qty 40

## 2018-09-12 MED ORDER — METOPROLOL SUCCINATE ER 50 MG PO TB24
50.0000 mg | ORAL_TABLET | Freq: Every day | ORAL | Status: DC
  Administered 2018-09-13: 50 mg via ORAL
  Filled 2018-09-12: qty 1

## 2018-09-12 MED ORDER — POTASSIUM CHLORIDE 20 MEQ PO PACK
40.0000 meq | PACK | Freq: Once | ORAL | Status: AC
  Administered 2018-09-12: 40 meq via ORAL
  Filled 2018-09-12: qty 2

## 2018-09-12 MED ORDER — HYDRALAZINE HCL 20 MG/ML IJ SOLN
10.0000 mg | Freq: Once | INTRAMUSCULAR | Status: AC
  Administered 2018-09-12: 10 mg via INTRAVENOUS
  Filled 2018-09-12: qty 1

## 2018-09-12 MED ORDER — METOPROLOL SUCCINATE ER 25 MG PO TB24
25.0000 mg | ORAL_TABLET | Freq: Once | ORAL | Status: AC
  Administered 2018-09-12: 25 mg via ORAL
  Filled 2018-09-12: qty 1

## 2018-09-12 NOTE — Discharge Instructions (Signed)

## 2018-09-12 NOTE — Progress Notes (Signed)
Pt HR  afib 130-160s while ambulating with PT. Pt asymptomatic.  HR decreased to 110s when she sat in chair and slowly decreased to 80s-90s. AM dose of lopressor given. Will monitor. Will notify Dr Grandville Silos on his rounds.

## 2018-09-12 NOTE — Plan of Care (Signed)
  Problem: Clinical Measurements: Goal: Ability to maintain clinical measurements within normal limits will improve Outcome: Progressing Goal: Diagnostic test results will improve Outcome: Progressing Goal: Cardiovascular complication will be avoided Outcome: Progressing   Problem: Activity: Goal: Risk for activity intolerance will decrease Outcome: Progressing   Problem: Safety: Goal: Ability to remain free from injury will improve Outcome: Progressing

## 2018-09-12 NOTE — Progress Notes (Signed)
Physical Therapy Treatment Patient Details Name: [name] MRN: [id number] DOB: Sep 10, 1927 Today's Date: 09/12/2018    History of Present Illness 84 y.o. female with medical history significant of A. fib not on anticoagulation, aortic stenosis, hypertension, hypothyroidism, OA, RA, neuropathy who presents to the emergency department from home with complaints of dizziness, nausea and found to be in afib with RVR.  Also with acute diverticulitis    PT Comments    Assisted pt OOB to amb to bathroom without any AD as pt stated she does not use a walker in her home but admitts to holding to furniture.  Assisted in bathroom with transfer and peri care.  Assisted with amb a great distance in hallway using a RW.  Noted HR increased to 156 pt asymptomatic.  RN observed as she was also in hallway. Pt would benefit from a  4 [employer] Rollator for community use.  Pt agrees.  Pt plans to D/C to home with sone as prior with [facility] PT.  Also rec a Rollator/reported to Therapist, sports.   Follow Up Recommendations  Home health PT     Equipment Recommendations  (4 [employer] Rollator with seat)    Recommendations for Other Services       Precautions / Restrictions Precautions Precautions: Fall Precaution Comments: A Fib Restrictions Weight Bearing Restrictions: No    Mobility  Bed Mobility Overal bed mobility: Needs Assistance Bed Mobility: Supine to Sit     Supine to sit: Min assist     General bed mobility comments: assist for trunk upright, pt reports mild dizziness, resolved quickly with position break  Transfers Overall transfer level: Needs assistance Equipment used: None;Rolling walker (2 wheeled) Transfers: Sit to/from [facility] Sit to Stand: Min guard;Min assist Stand pivot transfers: Min assist       General transfer comment: assist to rise and steady, verbal cues for hand placement for safety also assisted with toilet transfer  Ambulation/Gait Ambulation/Gait assistance:  Min guard;Min assist Gait Distance (Feet): 55 Feet Assistive device: Rolling walker (2 wheeled) Gait Pattern/deviations: Step-through pattern;Decreased stride length Gait velocity: decreased    General Gait Details: no c/o however pt on tele HR increased to 156.  Reported to RN who was in the hallway.  Pt stated "I feel fine".  Amb with RW however at home pt uses furniture.  Would benefit from a rollator for community use.  Pt agrees.  Also reported to RN.    Stairs             Wheelchair Mobility    Modified Rankin (Stroke Patients Only)       Balance                                            Cognition Arousal/Alertness: Awake/alert Behavior During Therapy: WFL for tasks assessed/performed Overall Cognitive Status: Within Functional Limits for tasks assessed                                 General Comments: very sweet      Exercises      General Comments        Pertinent Vitals/Pain Pain Assessment: No/denies pain    Home Living  Prior Function            PT Goals (current goals can now be found in the care plan section) Progress towards PT goals: Progressing toward goals    Frequency           PT Plan      Co-evaluation              AM-PAC PT "6 Clicks" Daily Activity  Outcome Measure  Difficulty turning over in bed (including adjusting bedclothes, sheets and blankets)?: A Little Difficulty moving from lying on back to sitting on the side of the bed? : A Little Difficulty sitting down on and standing up from a chair with arms (e.g., wheelchair, bedside commode, etc,.)?: A Little Help needed moving to and from a bed to chair (including a wheelchair)?: A Little Help needed walking in hospital room?: A Little Help needed climbing 3-5 steps with a railing? : A Little 6 Click Score: 18    End of Session Equipment Utilized During Treatment: Gait belt Activity Tolerance:  Patient tolerated treatment well Patient left: in chair;with chair alarm set;with call bell/phone within reach;with nursing/sitter in room Nurse Communication: Mobility status(elevated HR and need for youth rollator for D/C to home)       Time: 1025-1050 PT Time Calculation (min) (ACUTE ONLY): 25 min  Charges:  $Gait Training: 8-22 mins $Therapeutic Activity: 8-22 mins                     Rica Koyanagi  PTA Acute  Rehabilitation Services Pager      [phone] Office      [phone]

## 2018-09-12 NOTE — Progress Notes (Signed)
Peripherally Inserted Central Catheter/Midline Placement  The IV Nurse has discussed with the patient and/or persons authorized to consent for the patient, the purpose of this procedure and the potential benefits and risks involved with this procedure.  The benefits include less needle sticks, lab draws from the catheter, and the patient may be discharged home with the catheter. Risks include, but not limited to, infection, bleeding, blood clot (thrombus formation), and puncture of an artery; nerve damage and irregular heartbeat and possibility to perform a PICC exchange if needed/ordered by physician.  Alternatives to this procedure were also discussed.  Bard Power PICC patient education guide, fact sheet on infection prevention and patient information card has been provided to patient /or left at bedside.    PICC/Midline Placement Documentation  PICC Single Lumen 09/12/18 PICC Right Brachial 34 cm 0 cm (Active)  Indication for Insertion or Continuance of Line Home intravenous therapies (PICC only) 09/12/2018  9:05 AM  Exposed Catheter (cm) 0 cm 09/12/2018  9:05 AM  Site Assessment Clean;Dry;Intact 09/12/2018  9:05 AM  Line Status Flushed;Blood return noted;Saline locked 09/12/2018  9:05 AM  Dressing Type Transparent 09/12/2018  9:05 AM  Dressing Status Intact;Dry;Clean 09/12/2018  9:05 AM  Dressing Change Due 09/19/18 09/12/2018  9:05 AM       [address] 09/12/2018, 9:08 AM

## 2018-09-12 NOTE — Progress Notes (Signed)
Pt ambulated in hall with contact guard assist. HR afib 130-150s, pt asymptomatic. HR down to 100s when returned to bed.  Dr Grandville Silos paged to make aware.
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
## 2018-09-13 DIAGNOSIS — M792 Neuralgia and neuritis, unspecified: Secondary | ICD-10-CM

## 2018-09-13 LAB — BASIC METABOLIC PANEL
Anion gap: 6 (ref 5–15)
BUN: 6 mg/dL — ABNORMAL LOW (ref 8–23)
CO2: 26 mmol/L (ref 22–32)
Calcium: 7.8 mg/dL — ABNORMAL LOW (ref 8.9–10.3)
Chloride: 101 mmol/L (ref 98–111)
Creatinine, Ser: 0.78 mg/dL (ref 0.44–1.00)
GFR calc Af Amer: >60 mL/min (ref >60)
GFR calc non Af Amer: >60 mL/min (ref >60)
Glucose, Bld: 113 mg/dL — ABNORMAL HIGH (ref 70–99)
Potassium: 3.6 mmol/L (ref 3.5–5.1)
Sodium: 133 mmol/L — ABNORMAL LOW (ref 135–145)

## 2018-09-13 LAB — MAGNESIUM: Magnesium: 1.6 mg/dL — ABNORMAL LOW (ref 1.7–2.4)

## 2018-09-13 MED ORDER — METOPROLOL SUCCINATE ER 25 MG PO TB24
25.0000 mg | ORAL_TABLET | Freq: Once | ORAL | Status: AC
  Administered 2018-09-13: 25 mg via ORAL
  Filled 2018-09-13: qty 1

## 2018-09-13 MED ORDER — POTASSIUM CHLORIDE 20 MEQ PO PACK
40.0000 meq | PACK | Freq: Once | ORAL | Status: AC
  Administered 2018-09-13: 40 meq via ORAL
  Filled 2018-09-13: qty 2

## 2018-09-13 MED ORDER — CEFTRIAXONE IV (FOR PTA / DISCHARGE USE ONLY): 2.0000 g | INTRAVENOUS | 0 refills | Status: DC

## 2018-09-13 MED ORDER — METOPROLOL SUCCINATE ER 25 MG PO TB24: 75.0000 mg | ORAL_TABLET | Freq: Every day | ORAL | 0 refills | Status: DC

## 2018-09-13 MED ORDER — MAGNESIUM SULFATE 4 GM/100ML IV SOLN
4.0000 g | Freq: Once | INTRAVENOUS | Status: AC
  Administered 2018-09-13: 4 g via INTRAVENOUS
  Filled 2018-09-13: qty 100

## 2018-09-13 MED ORDER — METRONIDAZOLE 500 MG PO TABS: 500.0000 mg | ORAL_TABLET | Freq: Three times a day (TID) | ORAL | 0 refills | Status: DC

## 2018-09-13 MED ORDER — ONDANSETRON HCL 4 MG PO TABS: 4.0000 mg | ORAL_TABLET | Freq: Three times a day (TID) | ORAL | 0 refills | Status: DC | PRN

## 2018-09-13 MED ORDER — METOPROLOL SUCCINATE ER 50 MG PO TB24: 75.0000 mg | ORAL_TABLET | Freq: Every day | ORAL | Status: DC

## 2018-09-13 NOTE — Progress Notes (Addendum)
Reviewed discharge information with patient and caregiver. Answered all questions. Patient/caregiver able to teach back medications and reasons to contact MD/911. Patient verbalizes importance of PCP follow up appointment. Scripts given to patient which include IV rocephin. HH services and H infusion in place via CM . Pt d/ced with PICC line. PICC WNL  Heela Heishman M. Brigitte Pulse, RN

## 2018-09-13 NOTE — Care Management (Signed)
[id number] [name],BSN,RN3,CCM [phone]/BR abx infusion for home will be done through [facility] and agency is aware.
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
## 2018-09-13 NOTE — Progress Notes (Signed)
Physical Therapy Treatment Patient Details Name: [name] MRN: [id number] DOB: 04-16-1927 Today's Date: 09/13/2018    History of Present Illness 84 y.o. female with medical history significant of A. fib not on anticoagulation, aortic stenosis, hypertension, hypothyroidism, OA, RA, neuropathy who presents to the emergency department from home with complaints of dizziness, nausea and found to be in afib with RVR.  Also with acute diverticulitis    PT Comments    Patient progressing with ambulation distance.  HR max 140, but mostly in 120's to high 110's.  She was asymptomatic throughout.  Reviewed safety precautions for home for fall prevention and having son's assist initially when up to bathroom.  Feel she is appropriate for home with son assist and HHPT.    Follow Up Recommendations  Home health PT     Equipment Recommendations  Other (comment)(rollator in room)    Recommendations for Other Services       Precautions / Restrictions Precautions Precautions: Fall Precaution Comments: A Fib Restrictions Weight Bearing Restrictions: No    Mobility  Bed Mobility   Bed Mobility: Supine to Sit;Sit to Supine     Supine to sit: HOB elevated;Min guard Sit to supine: Supervision;HOB elevated   General bed mobility comments: initially assist for balance (reports history of vertigo)  Transfers Overall transfer level: Needs assistance Equipment used: None;Rolling walker (2 wheeled) Transfers: Sit to/from Stand Sit to Stand: Min guard         General transfer comment: for balance/safety  Ambulation/Gait Ambulation/Gait assistance: Supervision;Min guard Gait Distance (Feet): 220 Feet Assistive device: Rolling walker (2 wheeled) Gait Pattern/deviations: Step-through pattern;Decreased stride length     General Gait Details: increased time with turns, preferred to use RW rather than rollator that was already delivered to the room; HR max 140 but stayed mostly in  120's to high 110's   Stairs             Wheelchair Mobility    Modified Rankin (Stroke Patients Only)       Balance Overall balance assessment: Needs assistance Sitting-balance support: No upper extremity supported Sitting balance-Leahy Scale: Good     Standing balance support: Bilateral upper extremity supported Standing balance-Leahy Scale: Poor Standing balance comment: UE support for balance                            Cognition Arousal/Alertness: Awake/alert Behavior During Therapy: WFL for tasks assessed/performed Overall Cognitive Status: Within Functional Limits for tasks assessed                                        Exercises      General Comments General comments (skin integrity, edema, etc.): Educated in fall prevention with footwear, lighting, slow to rise and to sit back if not feeling well, items frequently used well within reach, etc      Pertinent Vitals/Pain Pain Assessment: No/denies pain    Home Living                      Prior Function            PT Goals (current goals can now be found in the care plan section) Progress towards PT goals: Progressing toward goals    Frequency    Min 3X/week      PT Plan Current plan remains appropriate  Co-evaluation              AM-PAC PT "6 Clicks" Daily Activity  Outcome Measure  Difficulty turning over in bed (including adjusting bedclothes, sheets and blankets)?: A Little Difficulty moving from lying on back to sitting on the side of the bed? : Unable Difficulty sitting down on and standing up from a chair with arms (e.g., wheelchair, bedside commode, etc,.)?: Unable Help needed moving to and from a bed to chair (including a wheelchair)?: A Little Help needed walking in hospital room?: A Little Help needed climbing 3-5 steps with a railing? : A Little 6 Click Score: 14    End of Session Equipment Utilized During Treatment: Gait  belt Activity Tolerance: Patient tolerated treatment well Patient left: in bed;with call bell/phone within reach;with bed alarm set   PT Visit Diagnosis: Other abnormalities of gait and mobility (R26.89);Unsteadiness on feet (R26.81)     Time: 1683-7290 PT Time Calculation (min) (ACUTE ONLY): 23 min  Charges:  $Gait Training: 8-22 mins $Therapeutic Activity: 8-22 mins                     Magda Kiel, PT Acute Rehabilitation Services [phone] 09/13/2018    Reginia Naas 09/13/2018, 1:32 PM

## 2018-09-14 DIAGNOSIS — K5792 Diverticulitis of intestine, part unspecified, without perforation or abscess without bleeding: Secondary | ICD-10-CM | POA: Diagnosis not present

## 2018-09-14 DIAGNOSIS — Z7982 Long term (current) use of aspirin: Secondary | ICD-10-CM | POA: Diagnosis not present

## 2018-09-14 DIAGNOSIS — Z5181 Encounter for therapeutic drug level monitoring: Secondary | ICD-10-CM | POA: Diagnosis not present

## 2018-09-14 DIAGNOSIS — I482 Chronic atrial fibrillation, unspecified: Secondary | ICD-10-CM | POA: Diagnosis not present

## 2018-09-14 DIAGNOSIS — Z792 Long term (current) use of antibiotics: Secondary | ICD-10-CM | POA: Diagnosis not present

## 2018-09-14 DIAGNOSIS — G8929 Other chronic pain: Secondary | ICD-10-CM | POA: Diagnosis not present

## 2018-09-14 DIAGNOSIS — I10 Essential (primary) hypertension: Secondary | ICD-10-CM | POA: Diagnosis not present

## 2018-09-14 DIAGNOSIS — E440 Moderate protein-calorie malnutrition: Secondary | ICD-10-CM | POA: Diagnosis not present

## 2018-09-14 DIAGNOSIS — Z452 Encounter for adjustment and management of vascular access device: Secondary | ICD-10-CM | POA: Diagnosis not present

## 2018-09-16 ENCOUNTER — Telehealth: Payer: Self-pay | Admitting: Behavioral Health

## 2018-09-16 DIAGNOSIS — I10 Essential (primary) hypertension: Secondary | ICD-10-CM | POA: Diagnosis not present

## 2018-09-16 DIAGNOSIS — Z452 Encounter for adjustment and management of vascular access device: Secondary | ICD-10-CM | POA: Diagnosis not present

## 2018-09-16 DIAGNOSIS — E440 Moderate protein-calorie malnutrition: Secondary | ICD-10-CM | POA: Diagnosis not present

## 2018-09-16 DIAGNOSIS — K5792 Diverticulitis of intestine, part unspecified, without perforation or abscess without bleeding: Secondary | ICD-10-CM | POA: Diagnosis not present

## 2018-09-16 DIAGNOSIS — G8929 Other chronic pain: Secondary | ICD-10-CM | POA: Diagnosis not present

## 2018-09-16 DIAGNOSIS — I482 Chronic atrial fibrillation, unspecified: Secondary | ICD-10-CM | POA: Diagnosis not present

## 2018-09-16 NOTE — Telephone Encounter (Signed)
Per patient's son Aarion Kittrell, she's doing pretty well, today there will be a nurse coming out to the home at 2 PM & then physical therapy at 3 PM. He declines TCM/Hospital visit at this time. Patient currently has an office visit scheduled for 10/01/18 at 2 PM with PCP. Mr. Stroh voiced that if anything changes he will call the office for an earlier appointment.

## 2018-09-18 ENCOUNTER — Encounter: Payer: Self-pay | Admitting: Infectious Disease

## 2018-09-18 DIAGNOSIS — G8929 Other chronic pain: Secondary | ICD-10-CM | POA: Diagnosis not present

## 2018-09-18 DIAGNOSIS — I10 Essential (primary) hypertension: Secondary | ICD-10-CM | POA: Diagnosis not present

## 2018-09-18 DIAGNOSIS — K5780 Diverticulitis of intestine, part unspecified, with perforation and abscess without bleeding: Secondary | ICD-10-CM | POA: Diagnosis not present

## 2018-09-18 DIAGNOSIS — I482 Chronic atrial fibrillation, unspecified: Secondary | ICD-10-CM | POA: Diagnosis not present

## 2018-09-18 DIAGNOSIS — K5792 Diverticulitis of intestine, part unspecified, without perforation or abscess without bleeding: Secondary | ICD-10-CM | POA: Diagnosis not present

## 2018-09-18 DIAGNOSIS — E440 Moderate protein-calorie malnutrition: Secondary | ICD-10-CM | POA: Diagnosis not present

## 2018-09-18 DIAGNOSIS — Z452 Encounter for adjustment and management of vascular access device: Secondary | ICD-10-CM | POA: Diagnosis not present

## 2018-09-22 ENCOUNTER — Emergency Department (HOSPITAL_COMMUNITY): Payer: Medicare Other

## 2018-09-22 ENCOUNTER — Encounter (HOSPITAL_COMMUNITY): Payer: Self-pay | Admitting: Emergency Medicine

## 2018-09-22 ENCOUNTER — Inpatient Hospital Stay (HOSPITAL_COMMUNITY)
Admission: EM | Admit: 2018-09-22 | Discharge: 2018-09-26 | DRG: 372 | Disposition: A | Discharge status: Home / Self Care | Payer: Medicare Other | Attending: Internal Medicine | Admitting: Internal Medicine

## 2018-09-22 ENCOUNTER — Other Ambulatory Visit: Payer: Self-pay

## 2018-09-22 DIAGNOSIS — K5792 Diverticulitis of intestine, part unspecified, without perforation or abscess without bleeding: Secondary | ICD-10-CM | POA: Diagnosis not present

## 2018-09-22 DIAGNOSIS — R197 Diarrhea, unspecified: Secondary | ICD-10-CM | POA: Diagnosis not present

## 2018-09-22 DIAGNOSIS — E039 Hypothyroidism, unspecified: Secondary | ICD-10-CM | POA: Diagnosis present

## 2018-09-22 DIAGNOSIS — R000 Tachycardia, unspecified: Secondary | ICD-10-CM | POA: Diagnosis present

## 2018-09-22 DIAGNOSIS — Z87891 Personal history of nicotine dependence: Secondary | ICD-10-CM

## 2018-09-22 DIAGNOSIS — E782 Mixed hyperlipidemia: Secondary | ICD-10-CM | POA: Diagnosis not present

## 2018-09-22 DIAGNOSIS — E876 Hypokalemia: Secondary | ICD-10-CM | POA: Diagnosis present

## 2018-09-22 DIAGNOSIS — N182 Chronic kidney disease, stage 2 (mild): Secondary | ICD-10-CM | POA: Diagnosis present

## 2018-09-22 DIAGNOSIS — R531 Weakness: Secondary | ICD-10-CM | POA: Diagnosis not present

## 2018-09-22 DIAGNOSIS — Z792 Long term (current) use of antibiotics: Secondary | ICD-10-CM

## 2018-09-22 DIAGNOSIS — E785 Hyperlipidemia, unspecified: Secondary | ICD-10-CM | POA: Diagnosis present

## 2018-09-22 DIAGNOSIS — Z7982 Long term (current) use of aspirin: Secondary | ICD-10-CM

## 2018-09-22 DIAGNOSIS — K5730 Diverticulosis of large intestine without perforation or abscess without bleeding: Secondary | ICD-10-CM | POA: Diagnosis not present

## 2018-09-22 DIAGNOSIS — Z452 Encounter for adjustment and management of vascular access device: Secondary | ICD-10-CM

## 2018-09-22 DIAGNOSIS — I350 Nonrheumatic aortic (valve) stenosis: Secondary | ICD-10-CM | POA: Diagnosis present

## 2018-09-22 DIAGNOSIS — Z880 Allergy status to penicillin: Secondary | ICD-10-CM

## 2018-09-22 DIAGNOSIS — N83202 Unspecified ovarian cyst, left side: Secondary | ICD-10-CM | POA: Diagnosis present

## 2018-09-22 DIAGNOSIS — E871 Hypo-osmolality and hyponatremia: Secondary | ICD-10-CM | POA: Diagnosis present

## 2018-09-22 DIAGNOSIS — K449 Diaphragmatic hernia without obstruction or gangrene: Secondary | ICD-10-CM | POA: Diagnosis present

## 2018-09-22 DIAGNOSIS — W19XXXA Unspecified fall, initial encounter: Secondary | ICD-10-CM | POA: Diagnosis not present

## 2018-09-22 DIAGNOSIS — Z8249 Family history of ischemic heart disease and other diseases of the circulatory system: Secondary | ICD-10-CM

## 2018-09-22 DIAGNOSIS — R0682 Tachypnea, not elsewhere classified: Secondary | ICD-10-CM | POA: Diagnosis present

## 2018-09-22 DIAGNOSIS — A0472 Enterocolitis due to Clostridium difficile, not specified as recurrent: Secondary | ICD-10-CM | POA: Diagnosis present

## 2018-09-22 DIAGNOSIS — Z79899 Other long term (current) drug therapy: Secondary | ICD-10-CM

## 2018-09-22 DIAGNOSIS — Z79890 Hormone replacement therapy (postmenopausal): Secondary | ICD-10-CM

## 2018-09-22 DIAGNOSIS — I482 Chronic atrial fibrillation, unspecified: Secondary | ICD-10-CM | POA: Diagnosis present

## 2018-09-22 DIAGNOSIS — I129 Hypertensive chronic kidney disease with stage 1 through stage 4 chronic kidney disease, or unspecified chronic kidney disease: Secondary | ICD-10-CM | POA: Diagnosis present

## 2018-09-22 DIAGNOSIS — I4819 Other persistent atrial fibrillation: Secondary | ICD-10-CM | POA: Diagnosis present

## 2018-09-22 DIAGNOSIS — K5720 Diverticulitis of large intestine with perforation and abscess without bleeding: Secondary | ICD-10-CM | POA: Diagnosis not present

## 2018-09-22 DIAGNOSIS — Z4682 Encounter for fitting and adjustment of non-vascular catheter: Secondary | ICD-10-CM | POA: Diagnosis not present

## 2018-09-22 DIAGNOSIS — R42 Dizziness and giddiness: Secondary | ICD-10-CM | POA: Diagnosis not present

## 2018-09-22 DIAGNOSIS — Z6825 Body mass index (BMI) 25.0-25.9, adult: Secondary | ICD-10-CM

## 2018-09-22 DIAGNOSIS — E860 Dehydration: Secondary | ICD-10-CM | POA: Diagnosis present

## 2018-09-22 DIAGNOSIS — N183 Chronic kidney disease, stage 3 unspecified: Secondary | ICD-10-CM | POA: Diagnosis present

## 2018-09-22 DIAGNOSIS — R55 Syncope and collapse: Secondary | ICD-10-CM | POA: Diagnosis present

## 2018-09-22 DIAGNOSIS — Z885 Allergy status to narcotic agent status: Secondary | ICD-10-CM

## 2018-09-22 DIAGNOSIS — I4891 Unspecified atrial fibrillation: Secondary | ICD-10-CM | POA: Diagnosis not present

## 2018-09-22 DIAGNOSIS — Z886 Allergy status to analgesic agent status: Secondary | ICD-10-CM

## 2018-09-22 DIAGNOSIS — E440 Moderate protein-calorie malnutrition: Secondary | ICD-10-CM | POA: Diagnosis present

## 2018-09-22 DIAGNOSIS — R110 Nausea: Secondary | ICD-10-CM | POA: Diagnosis not present

## 2018-09-22 DIAGNOSIS — D631 Anemia in chronic kidney disease: Secondary | ICD-10-CM | POA: Diagnosis present

## 2018-09-22 DIAGNOSIS — I10 Essential (primary) hypertension: Secondary | ICD-10-CM | POA: Diagnosis not present

## 2018-09-22 DIAGNOSIS — K589 Irritable bowel syndrome without diarrhea: Secondary | ICD-10-CM | POA: Diagnosis present

## 2018-09-22 DIAGNOSIS — M069 Rheumatoid arthritis, unspecified: Secondary | ICD-10-CM | POA: Diagnosis present

## 2018-09-22 DIAGNOSIS — Z825 Family history of asthma and other chronic lower respiratory diseases: Secondary | ICD-10-CM

## 2018-09-22 DIAGNOSIS — Z888 Allergy status to other drugs, medicaments and biological substances status: Secondary | ICD-10-CM

## 2018-09-22 LAB — I-STAT CHEM 8, ED
BUN: 18 mg/dL (ref 8–23)
Calcium, Ion: 1.05 mmol/L — ABNORMAL LOW (ref 1.15–1.40)
Chloride: 92 mmol/L — ABNORMAL LOW (ref 98–111)
Creatinine, Ser: 0.8 mg/dL (ref 0.44–1.00)
Glucose, Bld: 123 mg/dL — ABNORMAL HIGH (ref 70–99)
HCT: 38 % (ref 36.0–46.0)
Hemoglobin: 12.9 g/dL (ref 12.0–15.0)
Potassium: 3.2 mmol/L — ABNORMAL LOW (ref 3.5–5.1)
Sodium: 131 mmol/L — ABNORMAL LOW (ref 135–145)
TCO2: 29 mmol/L (ref 22–32)

## 2018-09-22 LAB — CBG MONITORING, ED: Glucose-Capillary: 112 mg/dL — ABNORMAL HIGH (ref 70–99)

## 2018-09-22 LAB — BASIC METABOLIC PANEL
Anion gap: 11 (ref 5–15)
BUN: 17 mg/dL (ref 8–23)
CO2: 27 mmol/L (ref 22–32)
Calcium: 8.2 mg/dL — ABNORMAL LOW (ref 8.9–10.3)
Chloride: 93 mmol/L — ABNORMAL LOW (ref 98–111)
Creatinine, Ser: 0.86 mg/dL (ref 0.44–1.00)
GFR calc Af Amer: >60 mL/min (ref >60)
GFR calc non Af Amer: 58 mL/min — ABNORMAL LOW (ref >60)
Glucose, Bld: 121 mg/dL — ABNORMAL HIGH (ref 70–99)
Potassium: 3.3 mmol/L — ABNORMAL LOW (ref 3.5–5.1)
Sodium: 131 mmol/L — ABNORMAL LOW (ref 135–145)

## 2018-09-22 LAB — CBC
HCT: 38 % (ref 36.0–46.0)
Hemoglobin: 11.9 g/dL — ABNORMAL LOW (ref 12.0–15.0)
MCH: 27.1 pg (ref 26.0–34.0)
MCHC: 31.3 g/dL (ref 30.0–36.0)
MCV: 86.6 fL (ref 80.0–100.0)
Platelets: 273 10*3/uL (ref 150–400)
RBC: 4.39 MIL/uL (ref 3.87–5.11)
RDW: 16.4 % — ABNORMAL HIGH (ref 11.5–15.5)
WBC: 20.7 10*3/uL — ABNORMAL HIGH (ref 4.0–10.5)
nRBC: 0 % (ref 0.0–0.2)

## 2018-09-22 LAB — C DIFFICILE QUICK SCREEN W PCR REFLEX
C Diff antigen: POSITIVE — AB
C Diff toxin: NEGATIVE

## 2018-09-22 LAB — I-STAT CG4 LACTIC ACID, ED
Lactic Acid, Venous: 1.88 mmol/L (ref 0.5–1.9)
Lactic Acid, Venous: 1.42 mmol/L (ref 0.5–1.9)

## 2018-09-22 LAB — I-STAT TROPONIN, ED: Troponin i, poc: 0.01 ng/mL (ref 0.00–0.08)

## 2018-09-22 LAB — MAGNESIUM: Magnesium: 1.6 mg/dL — ABNORMAL LOW (ref 1.7–2.4)

## 2018-09-22 MED ORDER — SODIUM CHLORIDE 0.9 % IV SOLN
2.0000 g | Freq: Once | INTRAVENOUS | Status: AC
  Administered 2018-09-22: 2 g via INTRAVENOUS
  Filled 2018-09-22: qty 2

## 2018-09-22 MED ORDER — GABAPENTIN 100 MG PO CAPS
100.0000 mg | ORAL_CAPSULE | Freq: Every day | ORAL | Status: DC
  Administered 2018-09-23 – 2018-09-25 (×4): 100 mg via ORAL
  Filled 2018-09-22 (×4): qty 1

## 2018-09-22 MED ORDER — ONDANSETRON HCL 4 MG/2ML IJ SOLN
4.0000 mg | Freq: Once | INTRAMUSCULAR | Status: AC
  Administered 2018-09-22: 4 mg via INTRAVENOUS
  Filled 2018-09-22: qty 2

## 2018-09-22 MED ORDER — METOPROLOL SUCCINATE ER 50 MG PO TB24
75.0000 mg | ORAL_TABLET | Freq: Every day | ORAL | Status: DC
  Administered 2018-09-23 – 2018-09-26 (×4): 75 mg via ORAL
  Filled 2018-09-22 (×4): qty 1

## 2018-09-22 MED ORDER — IOPAMIDOL (ISOVUE-300) INJECTION 61%
INTRAVENOUS | Status: AC
  Filled 2018-09-22: qty 100

## 2018-09-22 MED ORDER — LEVOTHYROXINE SODIUM 75 MCG PO TABS
75.0000 ug | ORAL_TABLET | Freq: Every day | ORAL | Status: DC
  Administered 2018-09-23 – 2018-09-26 (×4): 75 ug via ORAL
  Filled 2018-09-22 (×4): qty 1

## 2018-09-22 MED ORDER — METRONIDAZOLE IN NACL 5-0.79 MG/ML-% IV SOLN
500.0000 mg | Freq: Once | INTRAVENOUS | Status: AC
  Administered 2018-09-22: 500 mg via INTRAVENOUS
  Filled 2018-09-22: qty 100

## 2018-09-22 MED ORDER — ACETAMINOPHEN 500 MG PO TABS: 1000.0000 mg | ORAL_TABLET | Freq: Four times a day (QID) | ORAL | Status: DC | PRN

## 2018-09-22 MED ORDER — SODIUM CHLORIDE (PF) 0.9 % IJ SOLN
INTRAMUSCULAR | Status: AC
  Filled 2018-09-22: qty 50

## 2018-09-22 MED ORDER — OLOPATADINE HCL 0.1 % OP SOLN
1.0000 [drp] | Freq: Two times a day (BID) | OPHTHALMIC | Status: DC
  Administered 2018-09-23 – 2018-09-26 (×8): 1 [drp] via OPHTHALMIC
  Filled 2018-09-22: qty 5

## 2018-09-22 MED ORDER — CEFTRIAXONE IV (FOR PTA / DISCHARGE USE ONLY): 2.0000 g | INTRAVENOUS | Status: DC

## 2018-09-22 MED ORDER — ONDANSETRON HCL 4 MG PO TABS: 4.0000 mg | ORAL_TABLET | Freq: Four times a day (QID) | ORAL | Status: DC | PRN

## 2018-09-22 MED ORDER — ONDANSETRON HCL 4 MG PO TABS: 4.0000 mg | ORAL_TABLET | Freq: Three times a day (TID) | ORAL | Status: DC | PRN

## 2018-09-22 MED ORDER — ENOXAPARIN SODIUM 40 MG/0.4ML ~~LOC~~ SOLN
40.0000 mg | Freq: Every day | SUBCUTANEOUS | Status: DC
  Administered 2018-09-23 – 2018-09-25 (×4): 40 mg via SUBCUTANEOUS
  Filled 2018-09-22 (×4): qty 0.4

## 2018-09-22 MED ORDER — ASPIRIN 81 MG PO CHEW
81.0000 mg | CHEWABLE_TABLET | Freq: Every day | ORAL | Status: DC
  Administered 2018-09-23 – 2018-09-26 (×4): 81 mg via ORAL
  Filled 2018-09-22 (×4): qty 1

## 2018-09-22 MED ORDER — VANCOMYCIN HCL 10 G IV SOLR
1250.0000 mg | Freq: Once | INTRAVENOUS | Status: AC
  Administered 2018-09-22: 1250 mg via INTRAVENOUS
  Filled 2018-09-22: qty 1250

## 2018-09-22 MED ORDER — DEXTROSE-NACL 5-0.45 % IV SOLN
INTRAVENOUS | Status: DC
  Administered 2018-09-23: via INTRAVENOUS

## 2018-09-22 MED ORDER — SODIUM CHLORIDE 0.9 % IV SOLN
2.0000 g | INTRAVENOUS | Status: DC
  Administered 2018-09-23 – 2018-09-25 (×3): 2 g via INTRAVENOUS
  Filled 2018-09-22 (×3): qty 2

## 2018-09-22 MED ORDER — IOPAMIDOL (ISOVUE-300) INJECTION 61%
100.0000 mL | Freq: Once | INTRAVENOUS | Status: AC | PRN
  Administered 2018-09-22: 100 mL via INTRAVENOUS

## 2018-09-22 MED ORDER — ONDANSETRON HCL 4 MG/2ML IJ SOLN: 4.0000 mg | Freq: Four times a day (QID) | INTRAMUSCULAR | Status: DC | PRN

## 2018-09-22 MED ORDER — SODIUM CHLORIDE 0.9 % IV BOLUS
1000.0000 mL | Freq: Once | INTRAVENOUS | Status: AC
  Administered 2018-09-22: 1000 mL via INTRAVENOUS

## 2018-09-22 MED ORDER — ACETAMINOPHEN 325 MG PO TABS: 650.0000 mg | ORAL_TABLET | Freq: Four times a day (QID) | ORAL | Status: DC | PRN

## 2018-09-22 MED ORDER — ALPRAZOLAM 0.25 MG PO TABS
0.2500 mg | ORAL_TABLET | Freq: Every day | ORAL | Status: DC
  Administered 2018-09-23 – 2018-09-25 (×4): 0.25 mg via ORAL
  Filled 2018-09-22 (×4): qty 1

## 2018-09-22 MED ORDER — ACETAMINOPHEN 650 MG RE SUPP: 650.0000 mg | Freq: Four times a day (QID) | RECTAL | Status: DC | PRN

## 2018-09-22 MED ORDER — METRONIDAZOLE 500 MG PO TABS
500.0000 mg | ORAL_TABLET | Freq: Three times a day (TID) | ORAL | Status: DC
  Administered 2018-09-23 – 2018-09-25 (×7): 500 mg via ORAL
  Filled 2018-09-22 (×7): qty 1

## 2018-09-22 NOTE — H&P (Signed)
History and Physical   [name] VEL:[id number] DOB: 07/14/1927 DOA: 09/22/2018  Referring MD/NP/PA: Margarita Mail, PA-C  PCP: [name], NP   Patient coming from: Home  Chief Complaint: Diarrhea and Nausea  HPI: [name] is a 84 y.o. female with medical history significant of complex diverticulitis with perforation since September of this year who was last admitted on November 8 this year and discharged November 15 with IV Rocephin and Flagyl per ID team to continue home treatment.  Patient is not a candidate for surgery and is undergoing treatment with conservative measures.  Also has history of aortic stenosis, atrial fibrillation not on anticoagulation hypothyroidism hypertension leukocytosis electrolyte abnormalities including hypokalemia and hypomagnesemia.  Patient came to the ER today due to persistent diarrhea and nausea.  Abdominal pain is less.  She is still on IV antibiotics at home to be taken until December 15.  Patient could not handle it.  She feels dehydrated and weak.  Not able to continue current treatment at home.  She was seen in the ER and evaluated.  Surgical consultation was sought for and recommendation was to readmit patient to the hospital for reevaluation..  ED Course: Patient's temperature is 98.3 with blood pressure 149/89 pulse 108 respirate of 27 oxygen sat 97% on room air.  She has a sodium with the 131 potassium is 3.2 glucose 123 chloride 92.  Creatinine at this point was 0.8 with lactic acid 1.8.  Her white count is 20.7 thousand with hemoglobin 11.9 and platelets of 273.  CT abdomen and pelvis showed continued severe rectosigmoid perforated diverticulitis.  There was ill-defined multiple ill-defined pericolonic pelvic fluid collections that measure 4.5 x 5.1 cm.  Also mild descending colitis but no bowel obstruction.  Patient had abnormal air tracking to cystic left ovarian mass which was known but patient refused evaluation.   Possible cholelithiasis and possible acute cholecystitis but similar to prior findings.  Patient is being evaluated and being admitted on her IV Rocephin and Flagyl.  Review of Systems: As per HPI otherwise 10 point review of systems negative.    Past Medical History:  Diagnosis Date  . A-fib ([place])   . Aortic stenosis, moderate    last echo in 2010; AVA 1.1; mild AS per echo in June 2013  . Edema of both legs    s/p laser treatment per Dr. Donnetta Hutching  . Hiatal hernia   . History of chicken pox   . HLD (hyperlipidemia)   . HTN (hypertension)   . Hypothyroidism   . IBS (irritable bowel syndrome)   . Iron deficiency anemia   . Neuropathic pain 09/06/2015  . Rheumatoid arthritis [facility])     Past Surgical History:  Procedure Laterality Date  . APPENDECTOMY  1955  . CARPAL TUNNEL RELEASE Right   . CATARACT EXTRACTION    . ENDOSCOPIC VEIN LASER TREATMENT    . ENDOVENOUS ABLATION SAPHENOUS VEIN W/ LASER  08-29-2012   left greater saphenous vein   Sherren Mocha Early MD  . ENDOVENOUS ABLATION SAPHENOUS VEIN W/ LASER  09-19-2012   right greater saphenous vein by Curt Jews MD  . EYE SURGERY  2005   Bilateral cataract  . FOOT SURGERY  2003   bilateral Hammer Toe  . stab phlebectomy Right 01-02-2013   10-15 incisions right thigh and calf by Curt Jews MD     reports that she quit smoking about 34 years ago. Her smoking use included cigarettes. She has never used smokeless tobacco. She  reports that she does not drink alcohol or use drugs.  Allergies  Allergen Reactions  . Infliximab Other (See Comments)    REACTION: "Enlarged intestines and hernia. Also pushed intestines on lower part of lungs." REACTION: "Enlarged intestines and hernia. Also pushed intestines on lower part of lungs."  . Aspirin Nausea And Vomiting    Low Dose is ok Stomach problems  . Carvedilol Other (See Comments)    Fatigue/ ill Fatigue/ ill  . Codeine Nausea And Vomiting    "Deathly Sick"  . Penicillins Rash    Has  patient had a PCN reaction causing immediate rash, facial/tongue/throat swelling, SOB or lightheadedness with hypotension: Y Has patient had a PCN reaction causing severe rash involving mucus membranes or skin necrosis: Y Has patient had a PCN reaction that required hospitalization: N Has patient had a PCN reaction occurring within the last 10 years: N If all of the above answers are "NO", then may proceed with Cephalosporin use.     Family History  Problem Relation Age of Onset  . Heart disease Mother   . Peripheral vascular disease Mother   . Heart disease Father   . Peripheral vascular disease Father        Right leg amputation  . COPD Father   . Heart disease Brother 89       Heart Disease before age 47  . Heart attack Brother   . Peripheral vascular disease Unknown   . Hypertension Sister      Prior to Admission medications   Medication Sig Start Date End Date Taking? Authorizing Provider  acetaminophen (TYLENOL) 500 MG tablet Take 1,000 mg by mouth every 6 (six) hours as needed (joint pain).    Yes [provider]  ALPRAZolam (XANAX) 0.25 MG tablet Take 1 tablet (0.25 mg total) by mouth at bedtime as needed for anxiety. Patient taking differently: Take 0.25 mg by mouth at bedtime.  09/02/18  Yes Nche, Charlene Brooke, NP  aspirin 81 MG chewable tablet Chew 81 mg by mouth daily.   Yes [provider]  cefTRIAXone (ROCEPHIN) IVPB Inject 2 g into the vein daily. Indication:  diverticular abscesses  Last Day of Therapy:  10/21/18 Labs - Once weekly:  CBC/D and BMP 09/13/18 10/24/18 Yes Eugenie Filler, MD  gabapentin (NEURONTIN) 100 MG capsule Take 1 capsule (100 mg total) by mouth at bedtime. 07/03/18  Yes Nche, Charlene Brooke, NP  levothyroxine (SYNTHROID, LEVOTHROID) 75 MCG tablet Take 1 tablet (75 mcg total) by mouth daily before breakfast. 07/03/18  Yes Nche, Charlene Brooke, NP  metoprolol succinate (TOPROL-XL) 25 MG 24 hr tablet Take 3 tablets (75 mg total) by  mouth daily. 09/14/18  Yes Eugenie Filler, MD  metroNIDAZOLE (FLAGYL) 500 MG tablet Take 1 tablet (500 mg total) by mouth 3 (three) times daily. 09/13/18 10/22/18 Yes Eugenie Filler, MD  Olopatadine HCl 0.2 % SOLN Place 1 drop into both eyes daily.  07/20/18  Yes [provider]  ondansetron (ZOFRAN) 4 MG tablet Take 1 tablet (4 mg total) by mouth every 8 (eight) hours as needed for nausea or vomiting. 09/13/18  Yes Eugenie Filler, MD    Physical Exam: Vitals:   09/22/18 1535 09/22/18 1600 09/22/18 1630 09/22/18 1730  BP: (!) 142/81 (!) 143/74 129/80 (!) 142/70  Pulse: (!) 101 87 94 91  Resp: 20 (!) 21 19 (!) 27  Temp: 98.3 F (36.8 C)     TempSrc: Oral     SpO2: 100%  97% 98% 98%  Weight: 59.9 kg     Height: 5' (1.524 m)         Constitutional: NAD, calm, comfortable Vitals:   09/22/18 1535 09/22/18 1600 09/22/18 1630 09/22/18 1730  BP: (!) 142/81 (!) 143/74 129/80 (!) 142/70  Pulse: (!) 101 87 94 91  Resp: 20 (!) 21 19 (!) 27  Temp: 98.3 F (36.8 C)     TempSrc: Oral     SpO2: 100% 97% 98% 98%  Weight: 59.9 kg     Height: 5' (1.524 m)      Eyes: PERRL, lids and conjunctivae normal ENMT: Mucous membranes are moist. Posterior pharynx clear of any exudate or lesions.Normal dentition.  Neck: normal, supple, no masses, no thyromegaly Respiratory: clear to auscultation bilaterally, no wheezing, no crackles. Normal respiratory effort. No accessory muscle use.  Cardiovascular: Regular rate and rhythm, no murmurs / rubs / gallops. No extremity edema. 2+ pedal pulses. No carotid bruits.  Abdomen: Diffuse tenderness, no masses palpated. No hepatosplenomegaly. Bowel sounds positive.  Musculoskeletal: no clubbing / cyanosis. No joint deformity upper and lower extremities. Good ROM, no contractures. Normal muscle tone.  Skin: no rashes, lesions, ulcers. No induration Neurologic: CN 2-12 grossly intact. Sensation intact, DTR normal. Strength 5/5 in all 4.    Psychiatric: Normal judgment and insight. Alert and oriented x 3. Normal mood.     Labs on Admission: I have personally reviewed following labs and imaging studies  CBC: Recent Labs  Lab 09/22/18 1622 09/22/18 1635  WBC 20.7*  --   HGB 11.9* 12.9  HCT 38.0 38.0  MCV 86.6  --   PLT 273  --    Basic Metabolic Panel: Recent Labs  Lab 09/22/18 1622 09/22/18 1635  NA 131* 131*  K 3.3* 3.2*  CL 93* 92*  CO2 27  --   GLUCOSE 121* 123*  BUN 17 18  CREATININE 0.86 0.80  CALCIUM 8.2*  --   MG 1.6*  --    GFR: Estimated Creatinine Clearance: 38.6 mL/min (by C-G formula based on SCr of 0.8 mg/dL). Liver Function Tests: No results for input(s): AST, ALT, ALKPHOS, BILITOT, PROT, ALBUMIN in the last 168 hours. No results for input(s): LIPASE, AMYLASE in the last 168 hours. No results for input(s): AMMONIA in the last 168 hours. Coagulation Profile: No results for input(s): INR, PROTIME in the last 168 hours. Cardiac Enzymes: No results for input(s): CKTOTAL, CKMB, CKMBINDEX, TROPONINI in the last 168 hours. BNP (last 3 results) No results for input(s): PROBNP in the last 8760 hours. HbA1C: No results for input(s): HGBA1C in the last 72 hours. CBG: Recent Labs  Lab 09/22/18 1608  GLUCAP 112*   Lipid Profile: No results for input(s): CHOL, HDL, LDLCALC, TRIG, CHOLHDL, LDLDIRECT in the last 72 hours. Thyroid Function Tests: No results for input(s): TSH, T4TOTAL, FREET4, T3FREE, THYROIDAB in the last 72 hours. Anemia Panel: No results for input(s): VITAMINB12, FOLATE, FERRITIN, TIBC, IRON, RETICCTPCT in the last 72 hours. Urine analysis:    Component Value Date/Time   COLORURINE YELLOW 09/06/2018 0322   APPEARANCEUR CLEAR 09/06/2018 0322   LABSPEC 1.010 09/06/2018 0322   PHURINE 7.0 09/06/2018 0322   GLUCOSEU NEGATIVE 09/06/2018 0322   HGBUR SMALL (A) 09/06/2018 0322   BILIRUBINUR NEGATIVE 09/06/2018 0322   KETONESUR NEGATIVE 09/06/2018 0322   PROTEINUR NEGATIVE  09/06/2018 0322   UROBILINOGEN 0.2 07/03/2014 0313   NITRITE NEGATIVE 09/06/2018 0322   LEUKOCYTESUR TRACE (A) 09/06/2018 0322   Sepsis Labs: @  LABRCNTIP(procalcitonin:4,lacticidven:4) )No results found for this or any previous visit (from the past 240 hour(s)).   Radiological Exams on Admission: Ct Abdomen Pelvis W Contrast  Result Date: 09/22/2018 CLINICAL DATA:  Diarrhea since discharge from hospital 1 week ago. Fell this morning. History of diverticulitis, hiatal hernia, appendectomy. EXAM: CT ABDOMEN AND PELVIS WITH CONTRAST TECHNIQUE: Multidetector CT imaging of the abdomen and pelvis was performed using the standard protocol following bolus administration of intravenous contrast. CONTRAST:  120mL ISOVUE-300 IOPAMIDOL (ISOVUE-300) INJECTION 61% COMPARISON:  CT abdomen and pelvis September 07, 2018 and priors. FINDINGS: LOWER CHEST: Small similar bilateral sub pleural effusions. Included heart size is normal. No pericardial effusion. HEPATOBILIARY: Contracted gallbladder with multiple folds in similar small volume pericholecystic fluid and small layering sludge versus stones. Negative liver. PANCREAS: Normal. SPLEEN: Non suspicious. Two subcentimeter cysts versus lymphangioma is measuring to 10 mm. ADRENALS/URINARY TRACT: Kidneys are orthotopic, demonstrating symmetric enhancement. LEFT upper pole scarring. No nephrolithiasis, hydronephrosis or solid renal masses. The unopacified ureters are normal in course and caliber. Delayed imaging through the kidneys demonstrates symmetric prompt contrast excretion within the proximal urinary collecting system. Urinary bladder is partially distended and unremarkable. Normal adrenal glands. STOMACH/BOWEL: Large hiatal hernia. Severe rectosigmoid wall thickening and inflammatory changes associated with diverticular disease. Pericolonic ill-defined 4.5 x 5.1 cm fluid collection, additional ill-defined pericolonic collections. Extraluminal gas tracks to the LEFT  ovarian mass. Mild descending colonic wall thickening and edema. Small and large bowel are normal in course and caliber. VASCULAR/LYMPHATIC: Aortoiliac vessels are normal in course and caliber. Moderate to severe. Calcific atherosclerosis no lymphadenopathy by CT size criteria. REPRODUCTIVE: Uterus is displaced to the RIGHT. Multi-cystic LEFT ovarian 5.2 x 3.5 cm mass inseparable from patient's diverticulitis and abscess. OTHER: Small amount of free fluid in the pelvis. Presacral fat stranding. No intraperitoneal free air. MUSCULOSKELETAL: Nonacute. IMPRESSION: 1. Continued severe rectosigmoid perforated diverticulitis. Ill-defined multiple ill-defined pericolonic pelvic fluid collections measuring to 4.5 x 5.1 cm. Mild descending colitis. No bowel obstruction. 2. Abnormal air tracking to cystic LEFT ovarian mass, possible fistula. Recommend non emergent pelvic ultrasound for further characterization of LEFT ovarian mass. 3. Cholelithiasis and possible acute cholecystitis though, findings are similar to prior CT. 4. Similar small pleural effusions. Aortic Atherosclerosis (ICD10-I70.0). Electronically Signed   By: Elon Alas M.D.   On: 09/22/2018 19:02      Assessment/Plan Principal Problem:   Acute diverticulitis Active Problems:   Near syncope   HLD (hyperlipidemia)   Hypothyroidism   Atrial fibrillation, chronic   Essential hypertension   CKD (chronic kidney disease) stage 2, GFR 60-89 ml/min   Hypokalemia     #1 acute on chronic diverticulitis with perforation: Patient is still struggling with recurrent episodes of diverticulitis.  Not getting any better at this point.  She has no pain though but having persistent diarrhea.  We will readmit the patient.  Continue IV antibiotics.  Obtain new blood cultures and sensitivities.  Surgery to follow.  We may reconsult ID also in the morning.  #2 persistent diarrhea: Most likely due to colitis.  Since patient has been on antibiotics for a  while we will check C. difficile this time around.  #3 near syncope: most likely due to dehydration.  Monitor patient while aggressively hydrating the patient.  #4 hypokalemia: Replete potassium.  Check magnesium level which has been low in the past.  #5 hypothyroidism: Continue with home regimen of levothyroxine.  #6 atrial fibrillation: Chronic no anticoagulation.  #7 HTN: Controlled. Continue treatment  DVT prophylaxis: Lovenox  Code Status: Full  Family Communication: No family available  Disposition Plan: To be determined  Consults called: Surgery,  Dr Lovena Neighbours Admission status: inpatient   Severity of Illness: The appropriate patient status for this patient is INPATIENT. Inpatient status is judged to be reasonable and necessary in order to provide the required intensity of service to ensure the patient's safety. The patient's presenting symptoms, physical exam findings, and initial radiographic and laboratory data in the context of their chronic comorbidities is felt to place them at high risk for further clinical deterioration. Furthermore, it is not anticipated that the patient will be medically stable for discharge from the hospital within 2 midnights of admission. The following factors support the patient status of inpatient.   " The patient's presenting symptoms include nausea vomiting and diarrhea. " The worrisome physical exam findings include abdominal tenderness. " The initial radiographic and laboratory data are worrisome because of diverticulitis with evidence of perforation. " The chronic co-morbidities include chronic diverticulitis.   * I certify that at the point of admission it is my clinical judgment that the patient will require inpatient hospital care spanning beyond 2 midnights from the point of admission due to high intensity of service, high risk for further deterioration and high frequency of surveillance required.Barbette Merino MD [facility] Pager [phone]  If 7PM-7AM, please contact night-coverage [email or web address] Password [email or web address]  09/22/2018, 7:50 PM

## 2018-09-22 NOTE — ED Notes (Signed)
Pt attempted x2 to give urine sample but is unable.

## 2018-09-22 NOTE — ED Notes (Signed)
Pt still unable to void. Pt stated she does not void a lot during the day.

## 2018-09-22 NOTE — Consult Note (Signed)
Reason for Consult:complicated diverticulitis with abscess Referring Physician: Dr Maximiano Coss [name] is an 84 y.o. female.  HPI: 84 year old female with atrial fibrillation not anticoagulated, aortic stenosis, hiatal hernia, hypertension, hyperlipidemia and known complicated diverticulitis with abscess and perforation comes in the emergency room with nausea, diarrhea and discomfort in her lower abdomen.  She initially presented to the hospital in late September with diverticulitis.  She was readmitted from November 8 through the 62VO with complicated diverticulitis with phlegmon.  She also has a left ovarian mass near the area of diverticulitis.  Surgery saw her in the hospital the other week and she declined surgery at that time.  She was seen and discussed twice and both times she declined surgery.  A PICC line was placed and she was placed on long-term IV antibiotics.  She has been getting Rocephin and Flagyl.  She reports that she has been eating some at home mainly soft foods.  She reports some stomach discomfort which she describes in her left lower abdomen.  She has been having bowel movements but started having loose stool yesterday.  She denies any fevers or chills.  She denies any vomiting.  She has some left ovarian mass measuring 5 by 3-1/2 cm of unknown etiology  Past Medical History:  Diagnosis Date  . A-fib ([place])   . Aortic stenosis, moderate    last echo in 2010; AVA 1.1; mild AS per echo in June 2013  . Edema of both legs    s/p laser treatment per Dr. Donnetta Hutching  . Hiatal hernia   . History of chicken pox   . HLD (hyperlipidemia)   . HTN (hypertension)   . Hypothyroidism   . IBS (irritable bowel syndrome)   . Iron deficiency anemia   . Neuropathic pain 09/06/2015  . Rheumatoid arthritis [facility])     Past Surgical History:  Procedure Laterality Date  . APPENDECTOMY  1955  . CARPAL TUNNEL RELEASE Right   . CATARACT EXTRACTION    . ENDOSCOPIC VEIN LASER TREATMENT    .  ENDOVENOUS ABLATION SAPHENOUS VEIN W/ LASER  08-29-2012   left greater saphenous vein   Sherren Mocha Early MD  . ENDOVENOUS ABLATION SAPHENOUS VEIN W/ LASER  09-19-2012   right greater saphenous vein by Curt Jews MD  . EYE SURGERY  2005   Bilateral cataract  . FOOT SURGERY  2003   bilateral Hammer Toe  . stab phlebectomy Right 01-02-2013   10-15 incisions right thigh and calf by Curt Jews MD    Family History  Problem Relation Age of Onset  . Heart disease Mother   . Peripheral vascular disease Mother   . Heart disease Father   . Peripheral vascular disease Father        Right leg amputation  . COPD Father   . Heart disease Brother 66       Heart Disease before age 65  . Heart attack Brother   . Peripheral vascular disease Unknown   . Hypertension Sister     Social History:  reports that she quit smoking about 34 years ago. Her smoking use included cigarettes. She has never used smokeless tobacco. She reports that she does not drink alcohol or use drugs.  Allergies:  Allergies  Allergen Reactions  . Infliximab Other (See Comments)    REACTION: "Enlarged intestines and hernia. Also pushed intestines on lower part of lungs." REACTION: "Enlarged intestines and hernia. Also pushed intestines on lower part of lungs."  . Aspirin Nausea  And Vomiting    Low Dose is ok Stomach problems  . Carvedilol Other (See Comments)    Fatigue/ ill Fatigue/ ill  . Codeine Nausea And Vomiting    "Deathly Sick"  . Penicillins Rash    Has patient had a PCN reaction causing immediate rash, facial/tongue/throat swelling, SOB or lightheadedness with hypotension: Y Has patient had a PCN reaction causing severe rash involving mucus membranes or skin necrosis: Y Has patient had a PCN reaction that required hospitalization: N Has patient had a PCN reaction occurring within the last 10 years: N If all of the above answers are "NO", then may proceed with Cephalosporin use.     Medications: I have  reviewed the patient's current medications.  Results for orders placed or performed during the hospital encounter of 09/22/18 (from the past 48 hour(s))  CBG monitoring, ED     Status: Abnormal   Collection Time: 09/22/18  4:08 PM  Result Value Ref Range   Glucose-Capillary 112 (H) 70 - 99 mg/dL  Basic metabolic panel     Status: Abnormal   Collection Time: 09/22/18  4:22 PM  Result Value Ref Range   Sodium 131 (L) 135 - 145 mmol/L   Potassium 3.3 (L) 3.5 - 5.1 mmol/L   Chloride 93 (L) 98 - 111 mmol/L   CO2 27 22 - 32 mmol/L   Glucose, Bld 121 (H) 70 - 99 mg/dL   BUN 17 8 - 23 mg/dL   Creatinine, Ser 0.86 0.44 - 1.00 mg/dL   Calcium 8.2 (L) 8.9 - 10.3 mg/dL   GFR calc non Af Amer 58 (L) >60 mL/min   GFR calc Af Amer >60 >60 mL/min    Comment: (NOTE) The eGFR has been calculated using the CKD EPI equation. This calculation has not been validated in all clinical situations. eGFR's persistently <60 mL/min signify possible Chronic Kidney Disease.    Anion gap 11 5 - 15    Comment: Performed at [facility], [place] [address], [place], [place] [address]  CBC     Status: Abnormal   Collection Time: 09/22/18  4:22 PM  Result Value Ref Range   WBC 20.7 (H) 4.0 - 10.5 K/uL   RBC 4.39 3.87 - 5.11 MIL/uL   Hemoglobin 11.9 (L) 12.0 - 15.0 g/dL   HCT 38.0 36.0 - 46.0 %   MCV 86.6 80.0 - 100.0 fL   MCH 27.1 26.0 - 34.0 pg   MCHC 31.3 30.0 - 36.0 g/dL   RDW 16.4 (H) 11.5 - 15.5 %   Platelets 273 150 - 400 K/uL   nRBC 0.0 0.0 - 0.2 %    Comment: Performed at [facility], [place] [address], [place], [place] [address]  Magnesium     Status: Abnormal   Collection Time: 09/22/18  4:22 PM  Result Value Ref Range   Magnesium 1.6 (L) 1.7 - 2.4 mg/dL    Comment: Performed at [facility], [place] [address], [place], [place] [address]  I-stat troponin, ED     Status: None   Collection Time: 09/22/18  4:33 PM  Result Value Ref Range   Troponin  i, poc 0.01 0.00 - 0.08 ng/mL   Comment 3            Comment: Due to the release kinetics of cTnI, a negative result within the first hours of the onset of symptoms does not rule out myocardial infarction with certainty. If myocardial infarction is still suspected,  repeat the test at appropriate intervals.   I-Stat Chem 8, ED     Status: Abnormal   Collection Time: 09/22/18  4:35 PM  Result Value Ref Range   Sodium 131 (L) 135 - 145 mmol/L   Potassium 3.2 (L) 3.5 - 5.1 mmol/L   Chloride 92 (L) 98 - 111 mmol/L   BUN 18 8 - 23 mg/dL   Creatinine, Ser 0.80 0.44 - 1.00 mg/dL   Glucose, Bld 123 (H) 70 - 99 mg/dL   Calcium, Ion 1.05 (L) 1.15 - 1.40 mmol/L   TCO2 29 22 - 32 mmol/L   Hemoglobin 12.9 12.0 - 15.0 g/dL   HCT 38.0 36.0 - 46.0 %  I-Stat CG4 Lactic Acid, ED     Status: None   Collection Time: 09/22/18  4:35 PM  Result Value Ref Range   Lactic Acid, Venous 1.88 0.5 - 1.9 mmol/L  I-Stat CG4 Lactic Acid, ED     Status: None   Collection Time: 09/22/18  6:48 PM  Result Value Ref Range   Lactic Acid, Venous 1.42 0.5 - 1.9 mmol/L   CBC Latest Ref Rng & Units 09/22/2018 09/22/2018 09/12/2018  WBC 4.0 - 10.5 K/uL - 20.7(H) 9.7  Hemoglobin 12.0 - 15.0 g/dL 12.9 11.9(L) 9.8(L)  Hematocrit 36.0 - 46.0 % 38.0 38.0 31.9(L)  Platelets 150 - 400 K/uL - 273 344    Ct Abdomen Pelvis W Contrast  Result Date: 09/22/2018 CLINICAL DATA:  Diarrhea since discharge from hospital 1 week ago. Fell this morning. History of diverticulitis, hiatal hernia, appendectomy. EXAM: CT ABDOMEN AND PELVIS WITH CONTRAST TECHNIQUE: Multidetector CT imaging of the abdomen and pelvis was performed using the standard protocol following bolus administration of intravenous contrast. CONTRAST:  160m ISOVUE-300 IOPAMIDOL (ISOVUE-300) INJECTION 61% COMPARISON:  CT abdomen and pelvis September 07, 2018 and priors. FINDINGS: LOWER CHEST: Small similar bilateral sub pleural effusions. Included heart size is normal. No  pericardial effusion. HEPATOBILIARY: Contracted gallbladder with multiple folds in similar small volume pericholecystic fluid and small layering sludge versus stones. Negative liver. PANCREAS: Normal. SPLEEN: Non suspicious. Two subcentimeter cysts versus lymphangioma is measuring to 10 mm. ADRENALS/URINARY TRACT: Kidneys are orthotopic, demonstrating symmetric enhancement. LEFT upper pole scarring. No nephrolithiasis, hydronephrosis or solid renal masses. The unopacified ureters are normal in course and caliber. Delayed imaging through the kidneys demonstrates symmetric prompt contrast excretion within the proximal urinary collecting system. Urinary bladder is partially distended and unremarkable. Normal adrenal glands. STOMACH/BOWEL: Large hiatal hernia. Severe rectosigmoid wall thickening and inflammatory changes associated with diverticular disease. Pericolonic ill-defined 4.5 x 5.1 cm fluid collection, additional ill-defined pericolonic collections. Extraluminal gas tracks to the LEFT ovarian mass. Mild descending colonic wall thickening and edema. Small and large bowel are normal in course and caliber. VASCULAR/LYMPHATIC: Aortoiliac vessels are normal in course and caliber. Moderate to severe. Calcific atherosclerosis no lymphadenopathy by CT size criteria. REPRODUCTIVE: Uterus is displaced to the RIGHT. Multi-cystic LEFT ovarian 5.2 x 3.5 cm mass inseparable from patient's diverticulitis and abscess. OTHER: Small amount of free fluid in the pelvis. Presacral fat stranding. No intraperitoneal free air. MUSCULOSKELETAL: Nonacute. IMPRESSION: 1. Continued severe rectosigmoid perforated diverticulitis. Ill-defined multiple ill-defined pericolonic pelvic fluid collections measuring to 4.5 x 5.1 cm. Mild descending colitis. No bowel obstruction. 2. Abnormal air tracking to cystic LEFT ovarian mass, possible fistula. Recommend non emergent pelvic ultrasound for further characterization of LEFT ovarian mass. 3.  Cholelithiasis and possible acute cholecystitis though, findings are similar to prior CT. 4. Similar small pleural  effusions. Aortic Atherosclerosis (ICD10-I70.0). Electronically Signed   By: Elon Alas M.D.   On: 09/22/2018 19:02    Review of Systems  Constitutional: Positive for malaise/fatigue. Negative for chills, fever and weight loss.  HENT: Negative for nosebleeds.   Eyes: Negative for blurred vision.  Respiratory: Negative for shortness of breath.   Cardiovascular: Negative for chest pain, palpitations, orthopnea and PND.       Denies DOE  Gastrointestinal: Positive for abdominal pain, diarrhea and nausea.  Genitourinary: Negative for dysuria and hematuria.  Musculoskeletal: Negative.   Skin: Negative for itching and rash.  Neurological: Negative for dizziness, focal weakness, seizures, loss of consciousness and headaches.       Denies TIAs, amaurosis fugax  Endo/Heme/Allergies: Does not bruise/bleed easily.  Psychiatric/Behavioral: The patient is not nervous/anxious.    Blood pressure 134/68, pulse 88, temperature 98.3 F (36.8 C), temperature source Oral, resp. rate 17, height 5' (1.524 m), weight 59.9 kg, SpO2 94 %. Physical Exam  Vitals reviewed. Constitutional: She is oriented to person, place, and time. She appears well-developed and well-nourished. No distress.  Elderly female; nontoxic,   HENT:  Head: Normocephalic and atraumatic.  Right Ear: External ear normal.  Left Ear: External ear normal.  Eyes: Conjunctivae are normal. No scleral icterus.  Neck: Normal range of motion. Neck supple. No tracheal deviation present. No thyromegaly present.  Cardiovascular: Normal rate and normal heart sounds.  Respiratory: Effort normal and breath sounds normal. No stridor. No respiratory distress. She has no wheezes.  GI: Soft. She exhibits no distension. There is tenderness in the left lower quadrant. There is no rigidity, no rebound and no guarding.    Old lower  midline incision; soft, nd, TTP in LLQ. No rebound/peritonitis.   Musculoskeletal: She exhibits no edema or tenderness.  Lymphadenopathy:    She has no cervical adenopathy.  Neurological: She is alert and oriented to person, place, and time. She exhibits normal muscle tone.  Skin: Skin is warm and dry. No rash noted. She is not diaphoretic. No erythema. No pallor.  PICC line RUE  Psychiatric: She has a normal mood and affect. Her behavior is normal. Judgment and thought content normal.    Assessment/Plan: Severe perforated sigmoid diverticulitis with abscess Left ovarian mass Atrial fibrillation Aortic stenosis Hypertension Hypokalemia Hypomagnesia Diarrhea  Agree with admission to medical service. Continue IV antibiotics Agree with checking C. Difficile Replace electrolytes Diet as tolerated  I discussed diverticulitis with the patient.  We discussed indications for surgery which would be clinically worsening picture versus failure to progress. We discussed surgery. She states that she is not interested in surgery.  Discussed role of palliative care and discussed palliative care consultation.  Patient states that she is not interested.  She states that she knows what she wants.  She would like to get a little bit better so she can go home  One option would be interventional radiology consultation on Monday to see if there is a window for percutaneous drain.  However this may become a permanent drain and patient may need to understand that.  Since patient is not interested in surgery and clearly expresses her wishes There is really nothing else for [name] to offer  Please call with questions  Leighton Ruff. Redmond Pulling, MD, FACS General, Bariatric, & Minimally Invasive Surgery [facility]   Greer Pickerel 09/22/2018, 10:21 PM

## 2018-09-22 NOTE — ED Notes (Signed)
Pt aware a urine sample is needed. 

## 2018-09-22 NOTE — ED Triage Notes (Signed)
Pt BIB EMS from home. Pt was recently hospitalized for diverticulitis, since d/c has been getting progressively weaker, having diarrhea since d/c 1 week ago.  Pt reports falling this AM, no injury, no LOC, no blood thinners. EMS came this AM initially for the fall, she refused transport then.  Later called again, saying family insisted she come for evaluation.  Pt has been getting IV rocephin (2g daily) at home, not had it today. Has PICC in R arm.  Hx of afib.

## 2018-09-22 NOTE — ED Provider Notes (Signed)
[facility] DEPT Provider Note   CSN: [id number] Arrival date & time: 09/22/18  1514     History   Chief Complaint No chief complaint on file.   HPI [name] is a 84 y.o. female.  84 yo F with significant past medical history of complicated diverticulitis on IV antibiotics through PICC line at home comes in with a chief complaint of worsening nausea and fatigue.  The patient had an episode today where she was walking and then lost consciousness.  States that she just sat down into her rolling walker.  Unsure how long she was down.  Has had continued diarrhea as well.  Nonbloody not dark.  The history is provided by the patient.  Illness  This is a new problem. The current episode started more than 2 days ago. The problem occurs constantly. The problem has been gradually worsening. Associated symptoms include abdominal pain (mild). Pertinent negatives include no chest pain, no headaches and no shortness of breath. Nothing aggravates the symptoms. Nothing relieves the symptoms. The treatment provided no relief.    Past Medical History:  Diagnosis Date  . A-fib ([place])   . Aortic stenosis, moderate    last echo in 2010; AVA 1.1; mild AS per echo in June 2013  . Edema of both legs    s/p laser treatment per Dr. Donnetta Hutching  . Hiatal hernia   . History of chicken pox   . HLD (hyperlipidemia)   . HTN (hypertension)   . Hypothyroidism   . IBS (irritable bowel syndrome)   . Iron deficiency anemia   . Neuropathic pain 09/06/2015  . Rheumatoid arthritis [facility])     Patient Active Problem List   Diagnosis Date Noted  . Hypokalemia   . Hypomagnesemia   . Left ovarian cyst   . Acute diverticulitis 09/07/2018  . Atrial fibrillation with rapid ventricular response ([place]) 09/06/2018  . Leucocytosis 09/06/2018  . A-fib ([place]) 09/06/2018  . Malnutrition of moderate degree 07/26/2018  . Perforation of sigmoid colon due to diverticulitis 07/25/2018  .  Thrombocytosis ([place]) 07/25/2018  . Sepsis ([place]) 07/24/2018  . Anxiety about health 07/03/2018  . Generalized abdominal pain 02/06/2018  . Paresthesia of both lower extremities 02/06/2018  . CKD (chronic kidney disease) stage 2, GFR 60-89 ml/min 01/07/2018  . Normocytic normochromic anemia 01/07/2018  . Hyperglycemia 01/07/2018  . Pyoderma gangrenosum 01/04/2017  . Primary osteoarthritis of both hands 01/04/2017  . Primary osteoarthritis of both feet 01/04/2017  . Primary osteoarthritis of both knees 01/04/2017  . Degenerative joint disease involving multiple joints 01/04/2017  . Neuropathic pain 09/06/2015  . Erythema nodosum 09/21/2014  . Rheumatoid arthritis ([place]) 09/21/2014  . Essential hypertension 09/02/2014  . Rectal bleeding 05/15/2014  . Atrial fibrillation, chronic 05/11/2014  . HLD (hyperlipidemia) 04/02/2014  . Hypothyroidism 04/02/2014  . PAC (premature atrial contraction) 01/23/2014  . Atrial tachycardia, paroxysmal ([place]) 01/23/2014  . Near syncope 01/22/2014  . Varicose veins of lower extremities with other complications 67/67/2094  . Venous insufficiency of both lower extremities 04/19/2012  . Leg edema 01/17/2012  . Aortic stenosis 03/22/2011    Past Surgical History:  Procedure Laterality Date  . APPENDECTOMY  1955  . CARPAL TUNNEL RELEASE Right   . CATARACT EXTRACTION    . ENDOSCOPIC VEIN LASER TREATMENT    . ENDOVENOUS ABLATION SAPHENOUS VEIN W/ LASER  08-29-2012   left greater saphenous vein   Curt Jews MD  . ENDOVENOUS ABLATION SAPHENOUS VEIN W/ LASER  09-19-2012  right greater saphenous vein by Curt Jews MD  . EYE SURGERY  2005   Bilateral cataract  . FOOT SURGERY  2003   bilateral Hammer Toe  . stab phlebectomy Right 01-02-2013   10-15 incisions right thigh and calf by Curt Jews MD     OB History   None      Home Medications    Prior to Admission medications   Medication Sig Start Date End Date Taking? Authorizing Provider    acetaminophen (TYLENOL) 500 MG tablet Take 1,000 mg by mouth every 6 (six) hours as needed (joint pain).    Yes [provider]  ALPRAZolam (XANAX) 0.25 MG tablet Take 1 tablet (0.25 mg total) by mouth at bedtime as needed for anxiety. Patient taking differently: Take 0.25 mg by mouth at bedtime.  09/02/18  Yes Nche, Charlene Brooke, NP  aspirin 81 MG chewable tablet Chew 81 mg by mouth daily.   Yes [provider]  cefTRIAXone (ROCEPHIN) IVPB Inject 2 g into the vein daily. Indication:  diverticular abscesses  Last Day of Therapy:  10/21/18 Labs - Once weekly:  CBC/D and BMP 09/13/18 10/24/18 Yes Eugenie Filler, MD  gabapentin (NEURONTIN) 100 MG capsule Take 1 capsule (100 mg total) by mouth at bedtime. 07/03/18  Yes Nche, Charlene Brooke, NP  levothyroxine (SYNTHROID, LEVOTHROID) 75 MCG tablet Take 1 tablet (75 mcg total) by mouth daily before breakfast. 07/03/18  Yes Nche, Charlene Brooke, NP  metoprolol succinate (TOPROL-XL) 25 MG 24 hr tablet Take 3 tablets (75 mg total) by mouth daily. 09/14/18  Yes Eugenie Filler, MD  metroNIDAZOLE (FLAGYL) 500 MG tablet Take 1 tablet (500 mg total) by mouth 3 (three) times daily. 09/13/18 10/22/18 Yes Eugenie Filler, MD  Olopatadine HCl 0.2 % SOLN Place 1 drop into both eyes daily.  07/20/18  Yes [provider]  ondansetron (ZOFRAN) 4 MG tablet Take 1 tablet (4 mg total) by mouth every 8 (eight) hours as needed for nausea or vomiting. 09/13/18  Yes Eugenie Filler, MD    Family History Family History  Problem Relation Age of Onset  . Heart disease Mother   . Peripheral vascular disease Mother   . Heart disease Father   . Peripheral vascular disease Father        Right leg amputation  . COPD Father   . Heart disease Brother 48       Heart Disease before age 33  . Heart attack Brother   . Peripheral vascular disease Unknown   . Hypertension Sister     Social History Social History   Tobacco Use  . Smoking  status: Former Smoker    Types: Cigarettes    Last attempt to quit: 10/31/1983    Years since quitting: 34.9  . Smokeless tobacco: Never Used  Substance Use Topics  . Alcohol use: No  . Drug use: No     Allergies   Infliximab; Aspirin; Carvedilol; Codeine; and Penicillins   Review of Systems Review of Systems  Constitutional: Negative for chills and fever.  HENT: Negative for congestion and rhinorrhea.   Eyes: Negative for redness and visual disturbance.  Respiratory: Negative for shortness of breath and wheezing.   Cardiovascular: Negative for chest pain and palpitations.  Gastrointestinal: Positive for abdominal pain (mild), diarrhea and nausea. Negative for vomiting.  Genitourinary: Negative for dysuria and urgency.  Musculoskeletal: Negative for arthralgias and myalgias.  Skin: Negative for pallor and wound.  Neurological: Negative for dizziness and headaches.  Physical Exam Updated Vital Signs BP (!) 142/70   Pulse 91   Temp 98.3 F (36.8 C) (Oral)   Resp (!) 27   Ht 5' (1.524 m)   Wt 59.9 kg   SpO2 98%   BMI 25.78 kg/m   Physical Exam  Constitutional: She is oriented to person, place, and time. She appears well-developed and well-nourished. No distress.  HENT:  Head: Normocephalic and atraumatic.  Eyes: Pupils are equal, round, and reactive to light. EOM are normal.  Neck: Normal range of motion. Neck supple.  Cardiovascular: Normal rate and regular rhythm. Exam reveals no gallop and no friction rub.  No murmur heard. Pulmonary/Chest: Effort normal. She has no wheezes. She has no rales.  Abdominal: Soft. She exhibits no distension and no mass. There is tenderness ( mild worst to LLQ). There is no guarding.  Musculoskeletal: She exhibits no edema or tenderness.  Neurological: She is alert and oriented to person, place, and time.  Skin: Skin is warm and dry. She is not diaphoretic.  Psychiatric: She has a normal mood and affect. Her behavior is normal.    Nursing note and vitals reviewed.    ED Treatments / Results  Labs (all labs ordered are listed, but only abnormal results are displayed) Labs Reviewed  BASIC METABOLIC PANEL - Abnormal; Notable for the following components:      Result Value   Sodium 131 (*)    Potassium 3.3 (*)    Chloride 93 (*)    Glucose, Bld 121 (*)    Calcium 8.2 (*)    GFR calc non Af Amer 58 (*)    All other components within normal limits  CBC - Abnormal; Notable for the following components:   WBC 20.7 (*)    Hemoglobin 11.9 (*)    RDW 16.4 (*)    All other components within normal limits  MAGNESIUM - Abnormal; Notable for the following components:   Magnesium 1.6 (*)    All other components within normal limits  CBG MONITORING, ED - Abnormal; Notable for the following components:   Glucose-Capillary 112 (*)    All other components within normal limits  I-STAT CHEM 8, ED - Abnormal; Notable for the following components:   Sodium 131 (*)    Potassium 3.2 (*)    Chloride 92 (*)    Glucose, Bld 123 (*)    Calcium, Ion 1.05 (*)    All other components within normal limits  URINALYSIS, ROUTINE W REFLEX MICROSCOPIC  I-STAT CG4 LACTIC ACID, ED  I-STAT TROPONIN, ED  I-STAT CG4 LACTIC ACID, ED    EKG EKG Interpretation  Date/Time:  Sunday September 22 2018 15:37:28 EST Ventricular Rate:  90 PR Interval:    QRS Duration: 84 QT Interval:  396 QTC Calculation: 485 R Axis:   86 Text Interpretation:  Atrial fibrillation Borderline right axis deviation Borderline low voltage, extremity leads Nonspecific T abnormalities, anterior leads No significant change since last tracing Confirmed by Deno Etienne [phone]) on 09/22/2018 3:46:16 PM   Radiology Ct Abdomen Pelvis W Contrast  Result Date: 09/22/2018 CLINICAL DATA:  Diarrhea since discharge from hospital 1 week ago. Fell this morning. History of diverticulitis, hiatal hernia, appendectomy. EXAM: CT ABDOMEN AND PELVIS WITH CONTRAST TECHNIQUE:  Multidetector CT imaging of the abdomen and pelvis was performed using the standard protocol following bolus administration of intravenous contrast. CONTRAST:  123mL ISOVUE-300 IOPAMIDOL (ISOVUE-300) INJECTION 61% COMPARISON:  CT abdomen and pelvis September 07, 2018 and priors. FINDINGS: LOWER CHEST:  Small similar bilateral sub pleural effusions. Included heart size is normal. No pericardial effusion. HEPATOBILIARY: Contracted gallbladder with multiple folds in similar small volume pericholecystic fluid and small layering sludge versus stones. Negative liver. PANCREAS: Normal. SPLEEN: Non suspicious. Two subcentimeter cysts versus lymphangioma is measuring to 10 mm. ADRENALS/URINARY TRACT: Kidneys are orthotopic, demonstrating symmetric enhancement. LEFT upper pole scarring. No nephrolithiasis, hydronephrosis or solid renal masses. The unopacified ureters are normal in course and caliber. Delayed imaging through the kidneys demonstrates symmetric prompt contrast excretion within the proximal urinary collecting system. Urinary bladder is partially distended and unremarkable. Normal adrenal glands. STOMACH/BOWEL: Large hiatal hernia. Severe rectosigmoid wall thickening and inflammatory changes associated with diverticular disease. Pericolonic ill-defined 4.5 x 5.1 cm fluid collection, additional ill-defined pericolonic collections. Extraluminal gas tracks to the LEFT ovarian mass. Mild descending colonic wall thickening and edema. Small and large bowel are normal in course and caliber. VASCULAR/LYMPHATIC: Aortoiliac vessels are normal in course and caliber. Moderate to severe. Calcific atherosclerosis no lymphadenopathy by CT size criteria. REPRODUCTIVE: Uterus is displaced to the RIGHT. Multi-cystic LEFT ovarian 5.2 x 3.5 cm mass inseparable from patient's diverticulitis and abscess. OTHER: Small amount of free fluid in the pelvis. Presacral fat stranding. No intraperitoneal free air. MUSCULOSKELETAL: Nonacute.  IMPRESSION: 1. Continued severe rectosigmoid perforated diverticulitis. Ill-defined multiple ill-defined pericolonic pelvic fluid collections measuring to 4.5 x 5.1 cm. Mild descending colitis. No bowel obstruction. 2. Abnormal air tracking to cystic LEFT ovarian mass, possible fistula. Recommend non emergent pelvic ultrasound for further characterization of LEFT ovarian mass. 3. Cholelithiasis and possible acute cholecystitis though, findings are similar to prior CT. 4. Similar small pleural effusions. Aortic Atherosclerosis (ICD10-I70.0). Electronically Signed   By: Elon Alas M.D.   On: 09/22/2018 19:02    Procedures Procedures (including critical care time)  Medications Ordered in ED Medications  iopamidol (ISOVUE-300) 61 % injection (has no administration in time range)  sodium chloride (PF) 0.9 % injection (has no administration in time range)  vancomycin (VANCOCIN) 1,250 mg in sodium chloride 0.9 % 250 mL IVPB (has no administration in time range)  ceFEPIme (MAXIPIME) 2 g in sodium chloride 0.9 % 100 mL IVPB (has no administration in time range)  metroNIDAZOLE (FLAGYL) IVPB 500 mg (has no administration in time range)  sodium chloride 0.9 % bolus 1,000 mL (0 mLs Intravenous Stopped 09/22/18 1911)  ondansetron (ZOFRAN) injection 4 mg (4 mg Intravenous Given 09/22/18 1658)  iopamidol (ISOVUE-300) 61 % injection 100 mL (100 mLs Intravenous Contrast Given 09/22/18 1802)     Initial Impression / Assessment and Plan / ED Course  I have reviewed the triage vital signs and the nursing notes.  Pertinent labs & imaging results that were available during my care of the patient were reviewed by me and considered in my medical decision making (see chart for details).     84 yo F with a significant past medical history of complicated diverticulitis here with worsening diarrhea.  Going on for the past week or so.  Patient had episode today where she thought she might of passed out.  Lab work  with hypokalemia and hypomagnesemia.  Renal function appears to be at baseline.  Lactate is normal.  White count is elevated at 20,000.  CT scan with worsening findings from her prior.  I discussed the case with Dr. Lovena Neighbours, no acute surgical intervention but they will follow along in the hospital.  I will broaden out her antibiotics as she does not seem to be improving.  I  sent a C. difficile sample initially though the patient's stool was too solid to test so is unlikely to be C. difficile.  Discussed with the hospitalist for admission.  The patients results and plan were reviewed and discussed.   Any x-rays performed were independently reviewed by myself.   Differential diagnosis were considered with the presenting HPI.  Medications  iopamidol (ISOVUE-300) 61 % injection (has no administration in time range)  sodium chloride (PF) 0.9 % injection (has no administration in time range)  vancomycin (VANCOCIN) 1,250 mg in sodium chloride 0.9 % 250 mL IVPB (has no administration in time range)  ceFEPIme (MAXIPIME) 2 g in sodium chloride 0.9 % 100 mL IVPB (has no administration in time range)  metroNIDAZOLE (FLAGYL) IVPB 500 mg (has no administration in time range)  sodium chloride 0.9 % bolus 1,000 mL (0 mLs Intravenous Stopped 09/22/18 1911)  ondansetron (ZOFRAN) injection 4 mg (4 mg Intravenous Given 09/22/18 1658)  iopamidol (ISOVUE-300) 61 % injection 100 mL (100 mLs Intravenous Contrast Given 09/22/18 1802)    Vitals:   09/22/18 1535 09/22/18 1600 09/22/18 1630 09/22/18 1730  BP: (!) 142/81 (!) 143/74 129/80 (!) 142/70  Pulse: (!) 101 87 94 91  Resp: 20 (!) 21 19 (!) 27  Temp: 98.3 F (36.8 C)     TempSrc: Oral     SpO2: 100% 97% 98% 98%  Weight: 59.9 kg     Height: 5' (1.524 m)       Final diagnoses:  Diverticulitis    Admission/ observation were discussed with the admitting physician, patient and/or family and they are comfortable with the plan.    Final Clinical  Impressions(s) / ED Diagnoses   Final diagnoses:  Diverticulitis    ED Discharge Orders    None       Deno Etienne, DO 09/22/18 1942

## 2018-09-22 NOTE — ED Notes (Signed)
Pt on bedpan; attempting to give UA.

## 2018-09-22 NOTE — ED Notes (Signed)
Bed: WA08 Expected date:  Expected time:  Means of arrival:  Comments: weakness

## 2018-09-23 ENCOUNTER — Inpatient Hospital Stay (HOSPITAL_COMMUNITY): Payer: Medicare Other

## 2018-09-23 DIAGNOSIS — E876 Hypokalemia: Secondary | ICD-10-CM

## 2018-09-23 DIAGNOSIS — A0472 Enterocolitis due to Clostridium difficile, not specified as recurrent: Principal | ICD-10-CM

## 2018-09-23 DIAGNOSIS — K5720 Diverticulitis of large intestine with perforation and abscess without bleeding: Secondary | ICD-10-CM

## 2018-09-23 DIAGNOSIS — I482 Chronic atrial fibrillation, unspecified: Secondary | ICD-10-CM

## 2018-09-23 LAB — CBC
HCT: 33.7 % — ABNORMAL LOW (ref 36.0–46.0)
Hemoglobin: 10.4 g/dL — ABNORMAL LOW (ref 12.0–15.0)
MCH: 26.9 pg (ref 26.0–34.0)
MCHC: 30.9 g/dL (ref 30.0–36.0)
MCV: 87.1 fL (ref 80.0–100.0)
Platelets: 268 10*3/uL (ref 150–400)
RBC: 3.87 MIL/uL (ref 3.87–5.11)
RDW: 16.6 % — ABNORMAL HIGH (ref 11.5–15.5)
WBC: 13.9 10*3/uL — ABNORMAL HIGH (ref 4.0–10.5)
nRBC: 0 % (ref 0.0–0.2)

## 2018-09-23 LAB — COMPREHENSIVE METABOLIC PANEL
ALT: 7 U/L (ref 0–44)
AST: 20 U/L (ref 15–41)
Albumin: 2.3 g/dL — ABNORMAL LOW (ref 3.5–5.0)
Alkaline Phosphatase: 50 U/L (ref 38–126)
Anion gap: 6 (ref 5–15)
BUN: 15 mg/dL (ref 8–23)
CO2: 26 mmol/L (ref 22–32)
Calcium: 7.7 mg/dL — ABNORMAL LOW (ref 8.9–10.3)
Chloride: 100 mmol/L (ref 98–111)
Creatinine, Ser: 0.68 mg/dL (ref 0.44–1.00)
GFR calc Af Amer: >60 mL/min (ref >60)
GFR calc non Af Amer: >60 mL/min (ref >60)
Glucose, Bld: 99 mg/dL (ref 70–99)
Potassium: 2.8 mmol/L — ABNORMAL LOW (ref 3.5–5.1)
Sodium: 132 mmol/L — ABNORMAL LOW (ref 135–145)
Total Bilirubin: 0.7 mg/dL (ref 0.3–1.2)
Total Protein: 5.9 g/dL — ABNORMAL LOW (ref 6.5–8.1)

## 2018-09-23 LAB — MAGNESIUM: Magnesium: 1.6 mg/dL — ABNORMAL LOW (ref 1.7–2.4)

## 2018-09-23 LAB — CLOSTRIDIUM DIFFICILE BY PCR, REFLEXED: Toxigenic C. Difficile by PCR: POSITIVE — AB

## 2018-09-23 MED ORDER — VANCOMYCIN 50 MG/ML ORAL SOLUTION
125.0000 mg | Freq: Four times a day (QID) | ORAL | Status: DC
  Administered 2018-09-23 – 2018-09-26 (×14): 125 mg via ORAL
  Filled 2018-09-23 (×14): qty 2.5

## 2018-09-23 MED ORDER — BOOST / RESOURCE BREEZE PO LIQD CUSTOM
1.0000 | Freq: Three times a day (TID) | ORAL | Status: DC
  Administered 2018-09-23 – 2018-09-25 (×5): 1 via ORAL

## 2018-09-23 MED ORDER — SODIUM CHLORIDE 0.9% FLUSH
10.0000 mL | INTRAVENOUS | Status: DC | PRN
  Administered 2018-09-26: 10 mL
  Filled 2018-09-23: qty 40

## 2018-09-23 MED ORDER — SODIUM CHLORIDE 0.9 % IV SOLN: INTRAVENOUS | Status: DC

## 2018-09-23 MED ORDER — MAGNESIUM SULFATE 4 GM/100ML IV SOLN
4.0000 g | Freq: Once | INTRAVENOUS | Status: AC
  Administered 2018-09-23: 4 g via INTRAVENOUS
  Filled 2018-09-23: qty 100

## 2018-09-23 MED ORDER — ENSURE ENLIVE PO LIQD
237.0000 mL | Freq: Two times a day (BID) | ORAL | Status: DC
  Administered 2018-09-24: 237 mL via ORAL

## 2018-09-23 MED ORDER — POTASSIUM CHLORIDE CRYS ER 20 MEQ PO TBCR
40.0000 meq | EXTENDED_RELEASE_TABLET | ORAL | Status: AC
  Administered 2018-09-23 (×2): 40 meq via ORAL
  Filled 2018-09-23 (×2): qty 2

## 2018-09-23 MED ORDER — POTASSIUM CHLORIDE IN NACL 40-0.9 MEQ/L-% IV SOLN
INTRAVENOUS | Status: DC
  Administered 2018-09-23 – 2018-09-25 (×2): 75 mL/h via INTRAVENOUS
  Filled 2018-09-23 (×5): qty 1000

## 2018-09-23 NOTE — Progress Notes (Signed)
Patient ID: [name], female   DOB: 1927/06/14, 84 y.o.   MRN: [id number] Request received for possible CT-guided drainage of pelvic abscess in patient.  Imaging studies have been reviewed by Dr.Henn and there is currently no drainable abscess. Please page Dr. Anselm Pancoast at [phone] with any additional questions.

## 2018-09-23 NOTE — Progress Notes (Signed)
[facility]  Active pt with [facility] and pharmacy for home IV ABX.  [facility] l teams will follow pt while here to support transition home when ordered.  See OPAT/Home IVABX regimen as per below. cefTRIAXone  IVPB Commonly known as:  ROCEPHIN Inject 2 g into the vein daily. Indication:  diverticular abscesses  Last Day of Therapy:  10/21/18   If patient discharges after hours, please call [phone].   [name] 09/23/2018, 7:54 AM

## 2018-09-23 NOTE — Progress Notes (Addendum)
CC: Complicated diverticulitis with abscess, diarrhea and nausea x 2 days  Subjective: Patient says her pain is much better she is just sore now.  She says she came in because the pain but also 2 days of diarrhea she says she can barely get to the better the chair before she had to go back to the bathroom because of diarrhea.  Objective: Vital signs in last 24 hours: Temp:  [98.3 F (36.8 C)-98.4 F (36.9 C)] 98.4 F (36.9 C) (11/25 0008) Pulse Rate:  [84-108] 86 (11/25 0008) Resp:  [11-27] 15 (11/25 0008) BP: (128-149)/(68-89) 137/70 (11/25 0008) SpO2:  [94 %-100 %] 98 % (11/25 0008) Weight:  [59.9 kg] 59.9 kg (11/24 1535) Last BM Date: 09/22/18 Nothing p.o. recorded.  2226 IV No urine recorded BM x1 recorded Afebrile vital signs are stable NA 132, potassium 2.8,  WBC 20.7 >> 13.9 C. difficile antigen is positive C. difficile toxin is negative Toxigenic C. difficile colitis by PCR Admit CT 09/22/2018:  Continued severe rectosigmoid perforated diverticulitis. Ill-defined multiple ill-defined pericolonic pelvic fluid collections measuring to 4.5 x 5.1 cm. Mild descending colitis. No bowel obstruction.  Abnormal air tracking to cystic LEFT ovarian mass, possible fistula.   Intake/Output from previous day: 11/24 0701 - 11/25 0700 In: 2226.8 [I.V.:776.8; IV Piggyback:1450] Out: -  Intake/Output this shift: No intake/output data recorded.  General appearance: alert, cooperative and no distress GI: Soft, pain is much better today.  She points to her left lower quadrant as source of the main pain and says that is gone.  She is just a bit sore.  She also reports her diarrhea is much improved.  Lab Results:  Recent Labs    09/22/18 1622 09/22/18 1635 09/23/18 0558  WBC 20.7*  --  13.9*  HGB 11.9* 12.9 10.4*  HCT 38.0 38.0 33.7*  PLT 273  --  268    BMET Recent Labs    09/22/18 1622 09/22/18 1635 09/23/18 0558  NA 131* 131* 132*  K 3.3* 3.2* 2.8*  CL 93*  92* 100  CO2 27  --  26  GLUCOSE 121* 123* 99  BUN 17 18 15   CREATININE 0.86 0.80 0.68  CALCIUM 8.2*  --  7.7*   PT/INR No results for input(s): LABPROT, INR in the last 72 hours.  Recent Labs  Lab 09/23/18 0558  AST 20  ALT 7  ALKPHOS 50  BILITOT 0.7  PROT 5.9*  ALBUMIN 2.3*     Lipase     Component Value Date/Time   LIPASE 45 07/24/2018 1656     Medications: . ALPRAZolam  0.25 mg Oral QHS  . aspirin  81 mg Oral Daily  . enoxaparin (LOVENOX) injection  40 mg Subcutaneous QHS  . feeding supplement  1 Container Oral TID BM  . feeding supplement (ENSURE ENLIVE)  237 mL Oral BID BM  . gabapentin  100 mg Oral QHS  . levothyroxine  75 mcg Oral QAC breakfast  . metoprolol succinate  75 mg Oral Daily  . metroNIDAZOLE  500 mg Oral Q8H  . olopatadine  1 drop Both Eyes BID   . cefTRIAXone (ROCEPHIN)  IV 2 g (09/23/18 0944)  . dextrose 5 % and 0.45% NaCl 75 mL/hr at 09/23/18 0400   Anti-infectives (From admission, onward)   Start     Dose/Rate Route Frequency Ordered Stop   09/23/18 1000  cefTRIAXone (ROCEPHIN) 2 g in sodium chloride 0.9 % 100 mL IVPB     2 g  200 mL/hr over 30 Minutes Intravenous Every 24 hours 09/22/18 2351     09/23/18 0600  metroNIDAZOLE (FLAGYL) tablet 500 mg     500 mg Oral Every 8 hours 09/22/18 2338     09/22/18 2345  cefTRIAXone (ROCEPHIN) IVPB  Status:  Discontinued    Note to Pharmacy:  Indication:  diverticular abscesses  Last Day of Therapy:  10/21/18 Labs - Once weekly:  CBC/D and BMP     2 g Intravenous Every 24 hours 09/22/18 2338 09/22/18 2350   09/22/18 1930  vancomycin (VANCOCIN) 1,250 mg in sodium chloride 0.9 % 250 mL IVPB     1,250 mg 166.7 mL/hr over 90 Minutes Intravenous  Once 09/22/18 1924 09/22/18 2209   09/22/18 1930  ceFEPIme (MAXIPIME) 2 g in sodium chloride 0.9 % 100 mL IVPB     2 g 200 mL/hr over 30 Minutes Intravenous  Once 09/22/18 1924 09/22/18 2015   09/22/18 1930  metroNIDAZOLE (FLAGYL) IVPB 500 mg     500 mg 100  mL/hr over 60 Minutes Intravenous  Once 09/22/18 1924 09/22/18 2046     Assessment/Plan Atrial fibrillation Aortic stenosis Hypertension Hypokalemia Hypomagnesemia Hx diarrhea/C. difficile toxin positive  Severe perforated sigmoid diverticulitis with abscess Left ovarian mass  FEN: IV fluids/clear liquids ID: Rocephin/Flagyl 11/24=>> day 2 DVT: Lovenox Follow-up: None  - patient is not interested in surgery   Plan: Patient is not interested in surgery.  I did ask her if you would be interested in a possible drain.  IR has reviewed her CT and she is not a candidate for IR drain.    Says she is not interested in any surgical treatment, we will defer to Medicine and sign off.   LOS: 1 day    JENNINGS,WILLARD 09/23/2018 [phone]  Agree with above.  Alphonsa Overall, MD, [facility] Pager: [phone] Office phone:  [phone]

## 2018-09-23 NOTE — Progress Notes (Signed)
Spoke with RN Rocky Link and let her know that a chest x ray would be ordered to identify PICC Line placement and if PICC was in place then it can be used. RN stated okay and she would pass information on to night shift.

## 2018-09-23 NOTE — Progress Notes (Signed)
PROGRESS NOTE                                                                                                                                                                                                             Patient Demographics:    [name], is a 84 y.o. female, DOB - Mar 24, 1927, YKZ:[id number]  Admit date - 09/22/2018   Admitting Physician Elwyn Reach, MD  Outpatient Primary MD for the patient is Nche, Charlene Brooke, NP  LOS - 1  Outpatient Specialists: Cardiology  No chief complaint on file.      Brief Narrative 84 year old female with complex diverticulitis with perforation since September of this year, last hospitalized and discharged on 11/15 on IV Rocephin and Flagyl per ID.  Also has history of A. fib not on anticoagulation, aortic stenosis, hypertension, hypothyroidism.  During her last hospitalization she had A. fib with RVR.  Patient had refused for surgery and there was no fluid collection that was amenable to drain.  Decided on medical therapy with antibiotic for at least 4 weeks and outpatient ID follow-up. Patient was discharged home and did fine for few days but for the past few days started having nausea and persistent watery diarrhea.  She reported feeling dehydrated and weak and having poor p.o. intake  In the ED blood pressure was mildly elevated, tachycardic in low 100s and tachypneic.  Blood work showed sodium of 131, K of 3.2, lactic acid of 1.8 and elevated white count of 20.7K.  CT of the abdomen and pelvis showed contrast showed persistent rectosigmoid perforated diverticulitis and ill-defined pericolonic pelvic fluid collection measuring 4.5 x5.1 cm, also showed descending colitis but without bowel obstruction.  There is also abnormal air tracking to cystic left ovarian mass.  Patient refused evaluation on it in the past. Admitted for further management and persistent diverticulitis.   General surgery consulted.   Subjective:   Patient reports having some diarrhea earlier this morning.  Denies any abdominal pain or nausea.  Assessment  & Plan :    Principal Problem:   Acute recurrent diverticulitis with perforation Continued IV Rocephin and Flagyl that she was discharged as outpatient.  Stool for C. difficile PCR positive while toxin is negative.  Given picture of diarrhea and on prolonged antibiotic I will treat her for C. difficile with oral vancomycin. Patient  clearly refusing surgery again.  IR consulted who reviewed the images and recommended that there was no drainable abscess. I will place her on full liquid.  Serial abdominal exam.  Active problems C. difficile enteritis Has positive PCR but negative toxin.  Given her clinical presentation and prolonged antibiotic use I will treat her with oral vancomycin. Continue enteric precaution.  Near syncope Secondary to dehydration.  Monitor with fluids.  Hypokalemia/hypomagnesemia Secondary to GI loss.  Being replenished aggressively.  Atrial fibrillation, chronic Rate controlled.  Continue metoprolol.  Not on anticoagulation due to history of?  GI bleed.  Was in rapid A. fib during last hospitalization.  Hypothyroidism  continue Synthroid     CKD (chronic kidney disease) stage 2, GFR 60-89 ml/min Stable at baseline.      Code Status : DNR.  Discussed CODE STATUS in detail with the patient at bedside and she wishes to be DNR.  Agrees with meeting with palliative care for goals of care discussion.   Family Communication  : None at bedside.  Lives with her son who is a caregiver.  Disposition Plan  : Home once improved  Barriers For Discharge : Active symptoms  Consults  : Wing surgery, IR, palliative care  Procedures  : CT abdomen and pelvis  DVT Prophylaxis  :  Lovenox -   Lab Results  Component Value Date   PLT 268 09/23/2018    Antibiotics  :    Anti-infectives (From admission,  onward)   Start     Dose/Rate Route Frequency Ordered Stop   09/23/18 1400  vancomycin (VANCOCIN) 50 mg/mL oral solution 125 mg     125 mg Oral 4 times daily 09/23/18 1058 10/03/18 1359   09/23/18 1000  cefTRIAXone (ROCEPHIN) 2 g in sodium chloride 0.9 % 100 mL IVPB     2 g 200 mL/hr over 30 Minutes Intravenous Every 24 hours 09/22/18 2351     09/23/18 0600  metroNIDAZOLE (FLAGYL) tablet 500 mg     500 mg Oral Every 8 hours 09/22/18 2338     09/22/18 2345  cefTRIAXone (ROCEPHIN) IVPB  Status:  Discontinued    Note to Pharmacy:  Indication:  diverticular abscesses  Last Day of Therapy:  10/21/18 Labs - Once weekly:  CBC/D and BMP     2 g Intravenous Every 24 hours 09/22/18 2338 09/22/18 2350   09/22/18 1930  vancomycin (VANCOCIN) 1,250 mg in sodium chloride 0.9 % 250 mL IVPB     1,250 mg 166.7 mL/hr over 90 Minutes Intravenous  Once 09/22/18 1924 09/22/18 2209   09/22/18 1930  ceFEPIme (MAXIPIME) 2 g in sodium chloride 0.9 % 100 mL IVPB     2 g 200 mL/hr over 30 Minutes Intravenous  Once 09/22/18 1924 09/22/18 2015   09/22/18 1930  metroNIDAZOLE (FLAGYL) IVPB 500 mg     500 mg 100 mL/hr over 60 Minutes Intravenous  Once 09/22/18 1924 09/22/18 2046        Objective:   Vitals:   09/22/18 2130 09/22/18 2200 09/22/18 2230 09/23/18 0008  BP: 138/73 132/71 138/86 137/70  Pulse: 84 87 94 86  Resp: (!) 22 (!) 24 12 15   Temp:    98.4 F (36.9 C)  TempSrc:    Oral  SpO2: 97% 95% 97% 98%  Weight:      Height:        Wt Readings from Last 3 Encounters:  09/22/18 59.9 kg  09/06/18 54.3 kg  08/09/18 59 kg  Intake/Output Summary (Last 24 hours) at 09/23/2018 1221 Last data filed at 09/23/2018 0400 Gross per 24 hour  Intake 2226.77 ml  Output -  Net 2226.77 ml     Physical Exam  Gen: not in distress, fatigued HEENT: no pallor, dry mucosa, supple neck Chest: clear b/l, no added sounds CVS: N S1&S2, no murmurs, rubs or gallop GI: soft, distended, nontender, bowel sounds  present Musculoskeletal: warm, no edema     Data Review:    CBC Recent Labs  Lab 09/22/18 1622 09/22/18 1635 09/23/18 0558  WBC 20.7*  --  13.9*  HGB 11.9* 12.9 10.4*  HCT 38.0 38.0 33.7*  PLT 273  --  268  MCV 86.6  --  87.1  MCH 27.1  --  26.9  MCHC 31.3  --  30.9  RDW 16.4*  --  16.6*    Chemistries  Recent Labs  Lab 09/22/18 1622 09/22/18 1635 09/23/18 0558  NA 131* 131* 132*  K 3.3* 3.2* 2.8*  CL 93* 92* 100  CO2 27  --  26  GLUCOSE 121* 123* 99  BUN 17 18 15   CREATININE 0.86 0.80 0.68  CALCIUM 8.2*  --  7.7*  MG 1.6*  --   --   AST  --   --  20  ALT  --   --  7  ALKPHOS  --   --  50  BILITOT  --   --  0.7   ------------------------------------------------------------------------------------------------------------------ No results for input(s): CHOL, HDL, LDLCALC, TRIG, CHOLHDL, LDLDIRECT in the last 72 hours.  Lab Results  Component Value Date   HGBA1C 6.0 01/07/2018   ------------------------------------------------------------------------------------------------------------------ No results for input(s): TSH, T4TOTAL, T3FREE, THYROIDAB in the last 72 hours.  Invalid input(s): FREET3 ------------------------------------------------------------------------------------------------------------------ No results for input(s): VITAMINB12, FOLATE, FERRITIN, TIBC, IRON, RETICCTPCT in the last 72 hours.  Coagulation profile No results for input(s): INR, PROTIME in the last 168 hours.  No results for input(s): DDIMER in the last 72 hours.  Cardiac Enzymes No results for input(s): CKMB, TROPONINI, MYOGLOBIN in the last 168 hours.  Invalid input(s): CK ------------------------------------------------------------------------------------------------------------------ No results found for: BNP  Inpatient Medications  Scheduled Meds: . ALPRAZolam  0.25 mg Oral QHS  . aspirin  81 mg Oral Daily  . enoxaparin (LOVENOX) injection  40 mg Subcutaneous QHS    . feeding supplement  1 Container Oral TID BM  . feeding supplement (ENSURE ENLIVE)  237 mL Oral BID BM  . gabapentin  100 mg Oral QHS  . levothyroxine  75 mcg Oral QAC breakfast  . metoprolol succinate  75 mg Oral Daily  . metroNIDAZOLE  500 mg Oral Q8H  . olopatadine  1 drop Both Eyes BID  . vancomycin  125 mg Oral QID   Continuous Infusions: . cefTRIAXone (ROCEPHIN)  IV 2 g (09/23/18 0944)  . dextrose 5 % and 0.45% NaCl 75 mL/hr at 09/23/18 0400   PRN Meds:.acetaminophen **OR** acetaminophen, ondansetron **OR** ondansetron (ZOFRAN) IV  Micro Results Recent Results (from the past 240 hour(s))  C difficile quick scan w PCR reflex     Status: Abnormal   Collection Time: 09/22/18  8:14 PM  Result Value Ref Range Status   C Diff antigen POSITIVE (A) NEGATIVE Final   C Diff toxin NEGATIVE NEGATIVE Final   C Diff interpretation Results are indeterminate. See PCR results.  Final    Comment: Performed at [facility], [place] [address] [place], [place] [address]  C. Diff by PCR, Reflexed  Status: Abnormal   Collection Time: 09/22/18  8:14 PM  Result Value Ref Range Status   Toxigenic C. Difficile by PCR POSITIVE (A) NEGATIVE Final    Comment: Positive for toxigenic C. difficile with little to no toxin production. Only treat if clinical presentation suggests symptomatic illness. Performed at [facility], [place] [address] [place], [place] [address]     Radiology Reports Ct Abdomen Pelvis W Contrast  Result Date: 09/22/2018 CLINICAL DATA:  Diarrhea since discharge from hospital 1 week ago. Fell this morning. History of diverticulitis, hiatal hernia, appendectomy. EXAM: CT ABDOMEN AND PELVIS WITH CONTRAST TECHNIQUE: Multidetector CT imaging of the abdomen and pelvis was performed using the standard protocol following bolus administration of intravenous contrast. CONTRAST:  125mL ISOVUE-300 IOPAMIDOL (ISOVUE-300) INJECTION 61% COMPARISON:  CT abdomen and pelvis  September 07, 2018 and priors. FINDINGS: LOWER CHEST: Small similar bilateral sub pleural effusions. Included heart size is normal. No pericardial effusion. HEPATOBILIARY: Contracted gallbladder with multiple folds in similar small volume pericholecystic fluid and small layering sludge versus stones. Negative liver. PANCREAS: Normal. SPLEEN: Non suspicious. Two subcentimeter cysts versus lymphangioma is measuring to 10 mm. ADRENALS/URINARY TRACT: Kidneys are orthotopic, demonstrating symmetric enhancement. LEFT upper pole scarring. No nephrolithiasis, hydronephrosis or solid renal masses. The unopacified ureters are normal in course and caliber. Delayed imaging through the kidneys demonstrates symmetric prompt contrast excretion within the proximal urinary collecting system. Urinary bladder is partially distended and unremarkable. Normal adrenal glands. STOMACH/BOWEL: Large hiatal hernia. Severe rectosigmoid wall thickening and inflammatory changes associated with diverticular disease. Pericolonic ill-defined 4.5 x 5.1 cm fluid collection, additional ill-defined pericolonic collections. Extraluminal gas tracks to the LEFT ovarian mass. Mild descending colonic wall thickening and edema. Small and large bowel are normal in course and caliber. VASCULAR/LYMPHATIC: Aortoiliac vessels are normal in course and caliber. Moderate to severe. Calcific atherosclerosis no lymphadenopathy by CT size criteria. REPRODUCTIVE: Uterus is displaced to the RIGHT. Multi-cystic LEFT ovarian 5.2 x 3.5 cm mass inseparable from patient's diverticulitis and abscess. OTHER: Small amount of free fluid in the pelvis. Presacral fat stranding. No intraperitoneal free air. MUSCULOSKELETAL: Nonacute. IMPRESSION: 1. Continued severe rectosigmoid perforated diverticulitis. Ill-defined multiple ill-defined pericolonic pelvic fluid collections measuring to 4.5 x 5.1 cm. Mild descending colitis. No bowel obstruction. 2. Abnormal air tracking to cystic LEFT  ovarian mass, possible fistula. Recommend non emergent pelvic ultrasound for further characterization of LEFT ovarian mass. 3. Cholelithiasis and possible acute cholecystitis though, findings are similar to prior CT. 4. Similar small pleural effusions. Aortic Atherosclerosis (ICD10-I70.0). Electronically Signed   By: Elon Alas M.D.   On: 09/22/2018 19:02   Ct Abdomen Pelvis W Contrast  Result Date: 09/07/2018 CLINICAL DATA:  Evaluate for diverticulitis. Abdominal pain and nausea with diarrhea. EXAM: CT ABDOMEN AND PELVIS WITH CONTRAST TECHNIQUE: Multidetector CT imaging of the abdomen and pelvis was performed using the standard protocol following bolus administration of intravenous contrast. CONTRAST:  177mL OMNIPAQUE IOHEXOL 300 MG/ML  SOLN COMPARISON:  07/31/2018 and 07/24/2018 FINDINGS: Lower chest: Small bilateral pleural effusions with associated atelectasis slightly improved. Known moderate size hiatal hernia unchanged. Stable cardiomegaly. Hepatobiliary: Stable 1 cm hypodensity over the left lobe of the liver likely a cyst or hemangioma. Gallbladder and biliary tree unremarkable. Pancreas: Normal. Spleen: Couple small splenic hypodensities unchanged likely cysts. Adrenals/Urinary Tract: Adrenal glands are normal. Kidneys are normal in size, shape and position without hydronephrosis or nephrolithiasis. Right ureter and bladder are normal. Mild stable prominence of the left ureter unchanged and likely due  to the inflammatory/infectious process in the left lower quadrant/pelvis related to the colon. Stomach/Bowel: Moderate size hiatal hernia unchanged small bowel is normal. Previous appendectomy. Persistent moderate wall thickening and adjacent inflammatory change and mild free fluid involving a significant segment of sigmoid colon in the left lower quadrant/pelvis which is worse. Findings likely due to a significant acute colitis or diverticulitis. There are small air collections along the periphery  of the inflamed colon several which are likely extraluminal representing microperforations and developing diverticular abscesses. Some of these peripheral air collections are due to diverticula. Extraluminal air collection between the inflamed colon and left ovary likely additional contained perforation/abscess. There are a few adjacent small lymph nodes. Vascular/Lymphatic: Moderate calcified plaque over the abdominal aorta. Few small periaortic lymph nodes and few small lymph nodes adjacent the sigmoid colon. Reproductive: Stable 2 cm cystic structure over the left lower uterine segment unchanged. Uterus and right ovary unchanged. Persistent multi-cystic left ovary measuring 4 x 5.2 cm. Other: None. Musculoskeletal: Unchanged. IMPRESSION: Continued evidence of significant wall thickening and adjacent inflammatory change/fluid involving a moderate segment of the sigmoid colon in the left lower quadrant and pelvis. There has been interval worsening of these changes. Several adjacent air/fluid collections along the inflamed sigmoid colon suggesting micro perforations with developing diverticular abscesses. No significant free peritoneal air. Multi-cystic left ovary measuring 4 x 5.2 cm which may represent a low-grade cystic neoplasm. Stable 2 cm cystic structure along the left side of the lower uterine segment. Large hiatal hernia unchanged. Small bilateral pleural effusions with bibasilar atelectasis improved. 1 cm left liver hypodensity unchanged likely a cyst or hemangioma. Two small splenic hypodensities unchanged likely cysts. Aortic Atherosclerosis (ICD10-I70.0). Electronically Signed   By: Marin Olp M.D.   On: 09/07/2018 11:14   Dg Chest Port 1 View  Result Date: 09/12/2018 CLINICAL DATA:  Status post PICC line placement. EXAM: PORTABLE CHEST 1 VIEW COMPARISON:  11/05/2017 FINDINGS: A right arm PICC line has been placed. The catheter tip is in the lower SVC region. Stable enlargement of the heart with  a large hiatal hernia. Blunting at the costophrenic angles is unchanged. Negative for pulmonary edema. IMPRESSION: PICC line tip is appropriately positioned in the lower SVC region. No acute lung findings. Electronically Signed   By: Markus Daft M.D.   On: 09/12/2018 09:42   [name] Ekg Site Rite  Result Date: 09/10/2018 If Site Rite image not attached, placement could not be confirmed due to current cardiac rhythm.  [name] Ekg Site Rite  Result Date: 09/09/2018 If Site Rite image not attached, placement could not be confirmed due to current cardiac rhythm.   Time Spent in minutes  35   Siddhartha Hoback M.D on 09/23/2018 at 12:21 PM  Between 7am to 7pm - Pager - [phone]  After 7pm go to [email or web address] - password [email or web address]  [facility] -  Office  [phone]

## 2018-09-23 NOTE — Progress Notes (Signed)
Pt arrived to unit via stretcher room 1512. Alert and oriented x 4. General weakness. 0/10 pain. VS taken. Pt oriented to room and callbell. Pt guide at the bedside.initial assessment completed. Will continue to monitor.

## 2018-09-24 DIAGNOSIS — A0472 Enterocolitis due to Clostridium difficile, not specified as recurrent: Secondary | ICD-10-CM | POA: Diagnosis present

## 2018-09-24 DIAGNOSIS — I10 Essential (primary) hypertension: Secondary | ICD-10-CM

## 2018-09-24 DIAGNOSIS — K5792 Diverticulitis of intestine, part unspecified, without perforation or abscess without bleeding: Secondary | ICD-10-CM

## 2018-09-24 LAB — CBC
HCT: 31.2 % — ABNORMAL LOW (ref 36.0–46.0)
Hemoglobin: 9.6 g/dL — ABNORMAL LOW (ref 12.0–15.0)
MCH: 26.5 pg (ref 26.0–34.0)
MCHC: 30.8 g/dL (ref 30.0–36.0)
MCV: 86.2 fL (ref 80.0–100.0)
Platelets: 303 10*3/uL (ref 150–400)
RBC: 3.62 MIL/uL — ABNORMAL LOW (ref 3.87–5.11)
RDW: 16.3 % — ABNORMAL HIGH (ref 11.5–15.5)
WBC: 10.3 10*3/uL (ref 4.0–10.5)
nRBC: 0 % (ref 0.0–0.2)

## 2018-09-24 LAB — BASIC METABOLIC PANEL
Anion gap: 4 — ABNORMAL LOW (ref 5–15)
BUN: 11 mg/dL (ref 8–23)
CO2: 25 mmol/L (ref 22–32)
Calcium: 7.4 mg/dL — ABNORMAL LOW (ref 8.9–10.3)
Chloride: 104 mmol/L (ref 98–111)
Creatinine, Ser: 0.6 mg/dL (ref 0.44–1.00)
GFR calc Af Amer: >60 mL/min (ref >60)
GFR calc non Af Amer: >60 mL/min (ref >60)
Glucose, Bld: 107 mg/dL — ABNORMAL HIGH (ref 70–99)
Potassium: 3.7 mmol/L (ref 3.5–5.1)
Sodium: 133 mmol/L — ABNORMAL LOW (ref 135–145)

## 2018-09-24 LAB — MAGNESIUM: Magnesium: 2.3 mg/dL (ref 1.7–2.4)

## 2018-09-24 LAB — URINALYSIS, ROUTINE W REFLEX MICROSCOPIC
Bilirubin Urine: NEGATIVE
Glucose, UA: NEGATIVE mg/dL
Ketones, ur: NEGATIVE mg/dL
Nitrite: NEGATIVE
Protein, ur: NEGATIVE mg/dL
Specific Gravity, Urine: 1.024 (ref 1.005–1.030)
WBC, UA: >50 WBC/hpf — ABNORMAL HIGH (ref 0–5)
pH: 5 (ref 5.0–8.0)

## 2018-09-24 NOTE — Progress Notes (Signed)
PROGRESS NOTE                                                                                                                                                                                                             Patient Demographics:    [name], is a 84 y.o. female, DOB - 1927/04/19, CWC:[id number]  Admit date - 09/22/2018   Admitting Physician Elwyn Reach, MD  Outpatient Primary MD for the patient is Nche, Charlene Brooke, NP  LOS - 2  Outpatient Specialists: Cardiology  No chief complaint on file.      Brief Narrative 84 year old female with complex diverticulitis with perforation since September of this year, last hospitalized and discharged on 11/15 on IV Rocephin and Flagyl per ID.  Also has history of A. fib not on anticoagulation, aortic stenosis, hypertension, hypothyroidism.  During her last hospitalization she had A. fib with RVR.  Patient had refused for surgery and there was no fluid collection that was amenable to drain.  Decided on medical therapy with antibiotic for at least 4 weeks and outpatient ID follow-up. Patient was discharged home and did fine for few days but for the past few days started having nausea and persistent watery diarrhea.  She reported feeling dehydrated and weak and having poor p.o. intake  In the ED blood pressure was mildly elevated, tachycardic in low 100s and tachypneic.  Blood work showed sodium of 131, K of 3.2, lactic acid of 1.8 and elevated white count of 20.7K.  CT of the abdomen and pelvis showed contrast showed persistent rectosigmoid perforated diverticulitis and ill-defined pericolonic pelvic fluid collection measuring 4.5 x5.1 cm, also showed descending colitis but without bowel obstruction.  There is also abnormal air tracking to cystic left ovarian mass.  Patient refused evaluation on it in the past. Admitted for further management and persistent diverticulitis.   General surgery consulted.   Subjective:   Patient says that she still has diarrhea but frequency has been less from yesterday.  Reports some abdominal discomfort during bowel movement.  Remains afebrile.  No nausea or vomiting.  Assessment  & Plan :   Principal problem Acute recurrent diverticulitis with perforation and?  Abscess Resumed IV Rocephin and Flagyl that she was discharged on as outpatient. Patient clearly refusing surgery again.  IR consulted who reviewed the images and recommended that  there was no drainable abscess. Continue full liquid diet for today, can advance to soft tomorrow if tolerating well.  Active problems C. difficile enteritis Has positive PCR but negative toxin.  Given her clinical presentation and prolonged antibiotic use I will treat her with oral vancomycin for 10 days duration.  Leukocytosis has resolved this morning. Maintain enteric precaution.  Near syncope Secondary to dehydration.  Receiving IV fluids.  Hypokalemia/hypomagnesemia Secondary to GI loss.  Replaced adequately.  Atrial fibrillation, chronic Rate controlled on metoprolol.  Not on anticoagulation due to history of?  GI bleed.  Was in rapid A. fib during last hospitalization.  Hypothyroidism  continue Synthroid     CKD (chronic kidney disease) stage 2, GFR 60-89 ml/min Stable at baseline.      Code Status : DNR.  Discussed CODE STATUS in detail with the patient at bedside and she wishes to be DNR.  Agrees with meeting with palliative care for goals of care discussion.   Family Communication  : None at bedside.  Lives with her son who is her caregiver.  Disposition Plan  : Home possibly in the next 24-48 hours if abdominal symptoms improved.  Also goals of care to be discussed by palliative care.  Barriers For Discharge : Active symptoms  Consults  : [facility] surgery, IR, palliative care  Procedures  : CT abdomen and pelvis  DVT Prophylaxis  :  Lovenox -   Lab Results    Component Value Date   PLT 303 09/24/2018    Antibiotics  :    Anti-infectives (From admission, onward)   Start     Dose/Rate Route Frequency Ordered Stop   09/23/18 1400  vancomycin (VANCOCIN) 50 mg/mL oral solution 125 mg     125 mg Oral 4 times daily 09/23/18 1058 10/03/18 1359   09/23/18 1000  cefTRIAXone (ROCEPHIN) 2 g in sodium chloride 0.9 % 100 mL IVPB     2 g 200 mL/hr over 30 Minutes Intravenous Every 24 hours 09/22/18 2351     09/23/18 0600  metroNIDAZOLE (FLAGYL) tablet 500 mg     500 mg Oral Every 8 hours 09/22/18 2338     09/22/18 2345  cefTRIAXone (ROCEPHIN) IVPB  Status:  Discontinued    Note to Pharmacy:  Indication:  diverticular abscesses  Last Day of Therapy:  10/21/18 Labs - Once weekly:  CBC/D and BMP     2 g Intravenous Every 24 hours 09/22/18 2338 09/22/18 2350   09/22/18 1930  vancomycin (VANCOCIN) 1,250 mg in sodium chloride 0.9 % 250 mL IVPB     1,250 mg 166.7 mL/hr over 90 Minutes Intravenous  Once 09/22/18 1924 09/22/18 2209   09/22/18 1930  ceFEPIme (MAXIPIME) 2 g in sodium chloride 0.9 % 100 mL IVPB     2 g 200 mL/hr over 30 Minutes Intravenous  Once 09/22/18 1924 09/22/18 2015   09/22/18 1930  metroNIDAZOLE (FLAGYL) IVPB 500 mg     500 mg 100 mL/hr over 60 Minutes Intravenous  Once 09/22/18 1924 09/22/18 2046        Objective:   Vitals:   09/23/18 0008 09/23/18 1355 09/23/18 2011 09/24/18 0639  BP: 137/70 135/77 (!) 146/79 131/67  Pulse: 86 82 83 82  Resp: 15 18 20  (!) 24  Temp: 98.4 F (36.9 C) 98.2 F (36.8 C) 98.6 F (37 C) 97.8 F (36.6 C)  TempSrc: Oral  Oral Oral  SpO2: 98% 100%  97%  Weight:      Height:  Wt Readings from Last 3 Encounters:  09/22/18 59.9 kg  09/06/18 54.3 kg  08/09/18 59 kg     Intake/Output Summary (Last 24 hours) at 09/24/2018 0943 Last data filed at 09/24/2018 0925 Gross per 24 hour  Intake 1327.89 ml  Output -  Net 1327.89 ml    Physical exam Elderly female, fatigued, not in  distress HEENT: Moist mucosa, supple neck Chest: Clear bilaterally CVS: S1-S2 regular, no murmurs GI: Soft, distended, nontender, bowel sounds present Muscular skeletal: Warm, no edema      Data Review:    CBC Recent Labs  Lab 09/22/18 1622 09/22/18 1635 09/23/18 0558 09/24/18 0422  WBC 20.7*  --  13.9* 10.3  HGB 11.9* 12.9 10.4* 9.6*  HCT 38.0 38.0 33.7* 31.2*  PLT 273  --  268 303  MCV 86.6  --  87.1 86.2  MCH 27.1  --  26.9 26.5  MCHC 31.3  --  30.9 30.8  RDW 16.4*  --  16.6* 16.3*    Chemistries  Recent Labs  Lab 09/22/18 1622 09/22/18 1635 09/23/18 0558 09/24/18 0422  NA 131* 131* 132* 133*  K 3.3* 3.2* 2.8* 3.7  CL 93* 92* 100 104  CO2 27  --  26 25  GLUCOSE 121* 123* 99 107*  BUN 17 18 15 11   CREATININE 0.86 0.80 0.68 0.60  CALCIUM 8.2*  --  7.7* 7.4*  MG 1.6*  --  1.6* 2.3  AST  --   --  20  --   ALT  --   --  7  --   ALKPHOS  --   --  50  --   BILITOT  --   --  0.7  --    ------------------------------------------------------------------------------------------------------------------ No results for input(s): CHOL, HDL, LDLCALC, TRIG, CHOLHDL, LDLDIRECT in the last 72 hours.  Lab Results  Component Value Date   HGBA1C 6.0 01/07/2018   ------------------------------------------------------------------------------------------------------------------ No results for input(s): TSH, T4TOTAL, T3FREE, THYROIDAB in the last 72 hours.  Invalid input(s): FREET3 ------------------------------------------------------------------------------------------------------------------ No results for input(s): VITAMINB12, FOLATE, FERRITIN, TIBC, IRON, RETICCTPCT in the last 72 hours.  Coagulation profile No results for input(s): INR, PROTIME in the last 168 hours.  No results for input(s): DDIMER in the last 72 hours.  Cardiac Enzymes No results for input(s): CKMB, TROPONINI, MYOGLOBIN in the last 168 hours.  Invalid input(s):  CK ------------------------------------------------------------------------------------------------------------------ No results found for: BNP  Inpatient Medications  Scheduled Meds: . ALPRAZolam  0.25 mg Oral QHS  . aspirin  81 mg Oral Daily  . enoxaparin (LOVENOX) injection  40 mg Subcutaneous QHS  . feeding supplement  1 Container Oral TID BM  . feeding supplement (ENSURE ENLIVE)  237 mL Oral BID BM  . gabapentin  100 mg Oral QHS  . levothyroxine  75 mcg Oral QAC breakfast  . metoprolol succinate  75 mg Oral Daily  . metroNIDAZOLE  500 mg Oral Q8H  . olopatadine  1 drop Both Eyes BID  . vancomycin  125 mg Oral QID   Continuous Infusions: . 0.9 % NaCl with KCl 40 mEq / L 75 mL/hr (09/23/18 1314)  . cefTRIAXone (ROCEPHIN)  IV Stopped (09/23/18 1014)   PRN Meds:.acetaminophen **OR** acetaminophen, ondansetron **OR** ondansetron (ZOFRAN) IV, sodium chloride flush  Micro Results Recent Results (from the past 240 hour(s))  C difficile quick scan w PCR reflex     Status: Abnormal   Collection Time: 09/22/18  8:14 PM  Result Value Ref Range Status   C Diff  antigen POSITIVE (A) NEGATIVE Final   C Diff toxin NEGATIVE NEGATIVE Final   C Diff interpretation Results are indeterminate. See PCR results.  Final    Comment: Performed at [facility], [place] [address], [place], [place] [address]  C. Diff by PCR, Reflexed     Status: Abnormal   Collection Time: 09/22/18  8:14 PM  Result Value Ref Range Status   Toxigenic C. Difficile by PCR POSITIVE (A) NEGATIVE Final    Comment: Positive for toxigenic C. difficile with little to no toxin production. Only treat if clinical presentation suggests symptomatic illness. Performed at [facility], [place] [address] [place],  [address]     Radiology Reports Ct Abdomen Pelvis W Contrast  Result Date: 09/22/2018 CLINICAL DATA:  Diarrhea since discharge from hospital 1 week ago. Fell this morning. History of  diverticulitis, hiatal hernia, appendectomy. EXAM: CT ABDOMEN AND PELVIS WITH CONTRAST TECHNIQUE: Multidetector CT imaging of the abdomen and pelvis was performed using the standard protocol following bolus administration of intravenous contrast. CONTRAST:  189mL ISOVUE-300 IOPAMIDOL (ISOVUE-300) INJECTION 61% COMPARISON:  CT abdomen and pelvis September 07, 2018 and priors. FINDINGS: LOWER CHEST: Small similar bilateral sub pleural effusions. Included heart size is normal. No pericardial effusion. HEPATOBILIARY: Contracted gallbladder with multiple folds in similar small volume pericholecystic fluid and small layering sludge versus stones. Negative liver. PANCREAS: Normal. SPLEEN: Non suspicious. Two subcentimeter cysts versus lymphangioma is measuring to 10 mm. ADRENALS/URINARY TRACT: Kidneys are orthotopic, demonstrating symmetric enhancement. LEFT upper pole scarring. No nephrolithiasis, hydronephrosis or solid renal masses. The unopacified ureters are normal in course and caliber. Delayed imaging through the kidneys demonstrates symmetric prompt contrast excretion within the proximal urinary collecting system. Urinary bladder is partially distended and unremarkable. Normal adrenal glands. STOMACH/BOWEL: Large hiatal hernia. Severe rectosigmoid wall thickening and inflammatory changes associated with diverticular disease. Pericolonic ill-defined 4.5 x 5.1 cm fluid collection, additional ill-defined pericolonic collections. Extraluminal gas tracks to the LEFT ovarian mass. Mild descending colonic wall thickening and edema. Small and large bowel are normal in course and caliber. VASCULAR/LYMPHATIC: Aortoiliac vessels are normal in course and caliber. Moderate to severe. Calcific atherosclerosis no lymphadenopathy by CT size criteria. REPRODUCTIVE: Uterus is displaced to the RIGHT. Multi-cystic LEFT ovarian 5.2 x 3.5 cm mass inseparable from patient's diverticulitis and abscess. OTHER: Small amount of free fluid in  the pelvis. Presacral fat stranding. No intraperitoneal free air. MUSCULOSKELETAL: Nonacute. IMPRESSION: 1. Continued severe rectosigmoid perforated diverticulitis. Ill-defined multiple ill-defined pericolonic pelvic fluid collections measuring to 4.5 x 5.1 cm. Mild descending colitis. No bowel obstruction. 2. Abnormal air tracking to cystic LEFT ovarian mass, possible fistula. Recommend non emergent pelvic ultrasound for further characterization of LEFT ovarian mass. 3. Cholelithiasis and possible acute cholecystitis though, findings are similar to prior CT. 4. Similar small pleural effusions. Aortic Atherosclerosis (ICD10-I70.0). Electronically Signed   By: Elon Alas M.D.   On: 09/22/2018 19:02   Ct Abdomen Pelvis W Contrast  Result Date: 09/07/2018 CLINICAL DATA:  Evaluate for diverticulitis. Abdominal pain and nausea with diarrhea. EXAM: CT ABDOMEN AND PELVIS WITH CONTRAST TECHNIQUE: Multidetector CT imaging of the abdomen and pelvis was performed using the standard protocol following bolus administration of intravenous contrast. CONTRAST:  140mL OMNIPAQUE IOHEXOL 300 MG/ML  SOLN COMPARISON:  07/31/2018 and 07/24/2018 FINDINGS: Lower chest: Small bilateral pleural effusions with associated atelectasis slightly improved. Known moderate size hiatal hernia unchanged. Stable cardiomegaly. Hepatobiliary: Stable 1 cm hypodensity over the left lobe of the liver likely a  cyst or hemangioma. Gallbladder and biliary tree unremarkable. Pancreas: Normal. Spleen: Couple small splenic hypodensities unchanged likely cysts. Adrenals/Urinary Tract: Adrenal glands are normal. Kidneys are normal in size, shape and position without hydronephrosis or nephrolithiasis. Right ureter and bladder are normal. Mild stable prominence of the left ureter unchanged and likely due to the inflammatory/infectious process in the left lower quadrant/pelvis related to the colon. Stomach/Bowel: Moderate size hiatal hernia unchanged small  bowel is normal. Previous appendectomy. Persistent moderate wall thickening and adjacent inflammatory change and mild free fluid involving a significant segment of sigmoid colon in the left lower quadrant/pelvis which is worse. Findings likely due to a significant acute colitis or diverticulitis. There are small air collections along the periphery of the inflamed colon several which are likely extraluminal representing microperforations and developing diverticular abscesses. Some of these peripheral air collections are due to diverticula. Extraluminal air collection between the inflamed colon and left ovary likely additional contained perforation/abscess. There are a few adjacent small lymph nodes. Vascular/Lymphatic: Moderate calcified plaque over the abdominal aorta. Few small periaortic lymph nodes and few small lymph nodes adjacent the sigmoid colon. Reproductive: Stable 2 cm cystic structure over the left lower uterine segment unchanged. Uterus and right ovary unchanged. Persistent multi-cystic left ovary measuring 4 x 5.2 cm. Other: None. Musculoskeletal: Unchanged. IMPRESSION: Continued evidence of significant wall thickening and adjacent inflammatory change/fluid involving a moderate segment of the sigmoid colon in the left lower quadrant and pelvis. There has been interval worsening of these changes. Several adjacent air/fluid collections along the inflamed sigmoid colon suggesting micro perforations with developing diverticular abscesses. No significant free peritoneal air. Multi-cystic left ovary measuring 4 x 5.2 cm which may represent a low-grade cystic neoplasm. Stable 2 cm cystic structure along the left side of the lower uterine segment. Large hiatal hernia unchanged. Small bilateral pleural effusions with bibasilar atelectasis improved. 1 cm left liver hypodensity unchanged likely a cyst or hemangioma. Two small splenic hypodensities unchanged likely cysts. Aortic Atherosclerosis (ICD10-I70.0).  Electronically Signed   By: Marin Olp M.D.   On: 09/07/2018 11:14   Dg Chest Port 1 View  Result Date: 09/23/2018 CLINICAL DATA:  PICC line placement. EXAM: PORTABLE CHEST 1 VIEW COMPARISON:  09/12/2018 FINDINGS: Right-sided PICC line terminates in the expected location of the right atrium. Cardiomediastinal silhouette is normal. Mediastinal contours appear intact. There is no evidence of focal airspace consolidation, pleural effusion or pneumothorax. Osseous structures are without acute abnormality. Soft tissues are grossly normal. IMPRESSION: Right PICC line terminates in the expected location of the proximal right atrium. No evidence of pneumothorax. Electronically Signed   By: Fidela Salisbury M.D.   On: 09/23/2018 20:54   Dg Chest Port 1 View  Result Date: 09/12/2018 CLINICAL DATA:  Status post PICC line placement. EXAM: PORTABLE CHEST 1 VIEW COMPARISON:  11/05/2017 FINDINGS: A right arm PICC line has been placed. The catheter tip is in the lower SVC region. Stable enlargement of the heart with a large hiatal hernia. Blunting at the costophrenic angles is unchanged. Negative for pulmonary edema. IMPRESSION: PICC line tip is appropriately positioned in the lower SVC region. No acute lung findings. Electronically Signed   By: Markus Daft M.D.   On: 09/12/2018 09:42   [name] Ekg Site Rite  Result Date: 09/10/2018 If Site Rite image not attached, placement could not be confirmed due to current cardiac rhythm.  [name] Ekg Site Rite  Result Date: 09/09/2018 If Site Rite image not attached, placement could not be confirmed due  to current cardiac rhythm.   Time Spent in minutes  25   Tajee Savant M.D on 09/24/2018 at 9:43 AM  Between 7am to 7pm - Pager - [phone]  After 7pm go to [email or web address] - password [facility]  [facility] -  Office  [phone]

## 2018-09-24 NOTE — Consult Note (Signed)
   [facility] Inpatient Consult   09/24/2018  [name] April 20, 1927 [id number]     Patient screened for potential [facility] Care Management services due to multiple hospitalizations.  Went to bedside to speak with [name] about [place] Management program. She pleasantly declines [facility] Care Management services. Denies having any concerns with transportation or with medications. States her son lives with her.   Accepted [place] Management brochure with contact information should she change her mind.  Made inpatient RNCM aware [place] Management services were declined.    Marthenia Rolling, MSN-Ed, RN,BSN [facility] Liaison [phone]

## 2018-09-24 NOTE — Consult Note (Signed)
Consultation Note Date: 09/24/2018   Patient Name: [name]  DOB: 09-30-1927  MRN: [id number]  Age / Sex: 84 y.o., female  PCP: Nche, Charlene Brooke, NP Referring Physician: Louellen Molder, MD  Reason for Consultation: Establishing goals of care  HPI/Patient Profile: 84 y.o. female  admitted on 09/22/2018 84 year old female with complex diverticulitis with perforation since September of this year, last hospitalized and discharged on 11/15 on IV Rocephin and Flagyl per ID.  Also has history of A. fib not on anticoagulation, aortic stenosis, hypertension, hypothyroidism.  During her last hospitalization she had A. fib with RVR.  Patient had refused for surgery and there was no fluid collection that was amenable to drain.  Decided on medical therapy with antibiotic for at least 4 weeks and outpatient ID follow-up. Patient was discharged home and did fine for few days but for the past few days started having nausea and persistent watery diarrhea.  She reported feeling dehydrated and weak and having poor p.o. Intake.  Patient has hence been re admitted to hospital medicine service due to persistent diverticulitis. Hospital course also complicated this time by C diff enteritis, concern for perforation.  Patient has been seen by general surgery, did not wish to undergo any surgical procedure. She remains on antibiotics, full liquid diet.   A palliative consult has been placed for additional goals of care discussions.   Clinical Assessment and Goals of Care: The patient is resting in bed. I introduced myself and palliative care as follows: Palliative medicine is specialized medical care for people living with serious illness. It focuses on providing relief from the symptoms and stress of a serious illness. The goal is to improve quality of life for both the patient and the family.  The patient states, "I'm  not dying. I came here to get better, so that I can go home. I don't want hospice, I don't want Morphine."  Attempted to establish trust building and rapport by providing the patient with presence and active listening. Life review also done. Patient's husband was in the [employer], they moved around a lot, she has 2 boys. She lives with her son [name].   Patient states that she didn't want to wear the DNR bracelet, further more, she did not want to discuss further about DNR DNI. She wishes to go home, and have this discussion with her 2 sons along with her pastor. Offered many different ways of approaching this discussion, patient continued to say that this was upsetting for her. Patient kept on saying that she was not a sickly person, she doesn't like taking pills either.   Please note additional discussions as noted below. Thank you for the consult.   NEXT OF KIN  lives with son [name].  She has 2 sons Her husband died 8 years ago, they were married for over 85 years.    SUMMARY OF RECOMMENDATIONS    1. The patient endorses that she agreed to DNR DNI previously, how ever, now she refuses to wear bracelet, I re  discussed with her regarding DNR DNI versus full code. Re iterated my medical recommendations for her to consider DNR DNI. Patient states,"this is all very upsetting for me and my family, I did not come here to die."   At this point, will have to change code status back to full code. Patient wishes to go home and discuss code status with her children and her pastor, she doesn't wish to continue this conversation further, in the hospital at all. I will respect her wishes.   2. Recommend home with home health care, patient asking for social work/case manager to provide her with list of private duty sitters. She states that she lives with her son [name] and that he is having care giver stress and burn out because of her acute illness.   3. Patient continues to endorse that she does  not want surgery.   Continue current mode of care.  Thank you for the consult.   Code Status/Advance Care Planning:  Full code    Symptom Management:    as above   Palliative Prophylaxis:   Bowel Regimen  Additional Recommendations (Limitations, Scope, Preferences):  Full Scope Treatment  Psycho-social/Spiritual:   Desire for further Chaplaincy support:yes  Additional Recommendations: Caregiving  Support/Resources  Prognosis:   Unable to determine  Discharge Planning: Home with Home Health      Primary Diagnoses: Present on Admission: . Acute diverticulitis . Hypokalemia . Essential hypertension . CKD (chronic kidney disease) stage 2, GFR 60-89 ml/min . Atrial fibrillation, chronic . HLD (hyperlipidemia) . Hypothyroidism . Near syncope . Clostridium difficile colitis   I have reviewed the medical record, interviewed the patient and family, and examined the patient. The following aspects are pertinent.  Past Medical History:  Diagnosis Date  . A-fib ([place])   . Aortic stenosis, moderate    last echo in 2010; AVA 1.1; mild AS per echo in June 2013  . Edema of both legs    s/p laser treatment per Dr. Donnetta Hutching  . Hiatal hernia   . History of chicken pox   . HLD (hyperlipidemia)   . HTN (hypertension)   . Hypothyroidism   . IBS (irritable bowel syndrome)   . Iron deficiency anemia   . Neuropathic pain 09/06/2015  . Rheumatoid arthritis ([place])    Social History   Socioeconomic History  . Marital status: Widowed    Spouse name: Not on file  . Number of children: 2  . Years of education: Not on file  . Highest education level: Not on file  Occupational History  . Occupation: retired  Scientific laboratory technician  . Financial resource strain: Not on file  . Food insecurity:    Worry: Not on file    Inability: Not on file  . Transportation needs:    Medical: Not on file    Non-medical: Not on file  Tobacco Use  . Smoking status: Former Smoker    Types:  Cigarettes    Last attempt to quit: 10/31/1983    Years since quitting: 34.9  . Smokeless tobacco: Never Used  Substance and Sexual Activity  . Alcohol use: No  . Drug use: No  . Sexual activity: Never  Lifestyle  . Physical activity:    Days per week: Not on file    Minutes per session: Not on file  . Stress: Not on file  Relationships  . Social connections:    Talks on phone: Not on file    Gets together: Not on file  Attends religious service: Not on file    Active member of club or organization: Not on file    Attends meetings of clubs or organizations: Not on file    Relationship status: Not on file  Other Topics Concern  . Not on file  Social History Narrative   Lives by herself    Family History  Problem Relation Age of Onset  . Heart disease Mother   . Peripheral vascular disease Mother   . Heart disease Father   . Peripheral vascular disease Father        Right leg amputation  . COPD Father   . Heart disease Brother 62       Heart Disease before age 48  . Heart attack Brother   . Peripheral vascular disease Unknown   . Hypertension Sister    Scheduled Meds: . ALPRAZolam  0.25 mg Oral QHS  . aspirin  81 mg Oral Daily  . enoxaparin (LOVENOX) injection  40 mg Subcutaneous QHS  . feeding supplement  1 Container Oral TID BM  . feeding supplement (ENSURE ENLIVE)  237 mL Oral BID BM  . gabapentin  100 mg Oral QHS  . levothyroxine  75 mcg Oral QAC breakfast  . metoprolol succinate  75 mg Oral Daily  . metroNIDAZOLE  500 mg Oral Q8H  . olopatadine  1 drop Both Eyes BID  . vancomycin  125 mg Oral QID   Continuous Infusions: . 0.9 % NaCl with KCl 40 mEq / L 75 mL/hr (09/23/18 1314)  . cefTRIAXone (ROCEPHIN)  IV 2 g (09/24/18 1005)   PRN Meds:.acetaminophen **OR** acetaminophen, ondansetron **OR** ondansetron (ZOFRAN) IV, sodium chloride flush Medications Prior to Admission:  Prior to Admission medications   Medication Sig Start Date End Date Taking?  Authorizing Provider  acetaminophen (TYLENOL) 500 MG tablet Take 1,000 mg by mouth every 6 (six) hours as needed (joint pain).    Yes [provider]  ALPRAZolam (XANAX) 0.25 MG tablet Take 1 tablet (0.25 mg total) by mouth at bedtime as needed for anxiety. Patient taking differently: Take 0.25 mg by mouth at bedtime.  09/02/18  Yes Nche, Charlene Brooke, NP  aspirin 81 MG chewable tablet Chew 81 mg by mouth daily.   Yes [provider]  cefTRIAXone (ROCEPHIN) IVPB Inject 2 g into the vein daily. Indication:  diverticular abscesses  Last Day of Therapy:  10/21/18 Labs - Once weekly:  CBC/D and BMP 09/13/18 10/24/18 Yes Eugenie Filler, MD  gabapentin (NEURONTIN) 100 MG capsule Take 1 capsule (100 mg total) by mouth at bedtime. 07/03/18  Yes Nche, Charlene Brooke, NP  levothyroxine (SYNTHROID, LEVOTHROID) 75 MCG tablet Take 1 tablet (75 mcg total) by mouth daily before breakfast. 07/03/18  Yes Nche, Charlene Brooke, NP  metoprolol succinate (TOPROL-XL) 25 MG 24 hr tablet Take 3 tablets (75 mg total) by mouth daily. 09/14/18  Yes Eugenie Filler, MD  metroNIDAZOLE (FLAGYL) 500 MG tablet Take 1 tablet (500 mg total) by mouth 3 (three) times daily. 09/13/18 10/22/18 Yes Eugenie Filler, MD  Olopatadine HCl 0.2 % SOLN Place 1 drop into both eyes daily.  07/20/18  Yes [provider]  ondansetron (ZOFRAN) 4 MG tablet Take 1 tablet (4 mg total) by mouth every 8 (eight) hours as needed for nausea or vomiting. 09/13/18  Yes Eugenie Filler, MD   Allergies  Allergen Reactions  . Infliximab Other (See Comments)    REACTION: "Enlarged intestines and hernia. Also pushed intestines on lower  part of lungs." REACTION: "Enlarged intestines and hernia. Also pushed intestines on lower part of lungs."  . Aspirin Nausea And Vomiting    Low Dose is ok Stomach problems  . Carvedilol Other (See Comments)    Fatigue/ ill Fatigue/ ill  . Codeine Nausea And Vomiting    "Deathly Sick"  .  Penicillins Rash    Has patient had a PCN reaction causing immediate rash, facial/tongue/throat swelling, SOB or lightheadedness with hypotension: Y Has patient had a PCN reaction causing severe rash involving mucus membranes or skin necrosis: Y Has patient had a PCN reaction that required hospitalization: N Has patient had a PCN reaction occurring within the last 10 years: N If all of the above answers are "NO", then may proceed with Cephalosporin use.    Review of Systems +episodic abdominal pain.   Physical Exam Awake alert Resting in bed Abdomen is some what distended, non tender No edema Non focal Clear   Vital Signs: BP 131/67   Pulse 82   Temp 97.8 F (36.6 C) (Oral)   Resp (!) 24   Ht 5' (1.524 m)   Wt 59.9 kg   SpO2 97%   BMI 25.78 kg/m  Pain Scale: 0-10   Pain Score: 0-No pain   SpO2: SpO2: 97 % O2 Device:SpO2: 97 % O2 Flow Rate: .   IO: Intake/output summary:   Intake/Output Summary (Last 24 hours) at 09/24/2018 1429 Last data filed at 09/24/2018 3559 Gross per 24 hour  Intake 1027.89 ml  Output -  Net 1027.89 ml    LBM: Last BM Date: 09/22/18 Baseline Weight: Weight: 59.9 kg Most recent weight: Weight: 59.9 kg     Palliative Assessment/Data:   PPS 40%   Time In:  1300 Time Out:  1400 Time Total:  60 min   Greater than 50%  of this time was spent counseling and coordinating care related to the above assessment and plan.  Signed by: Loistine Chance, MD  [phone] Please contact Palliative Medicine Team phone at [phone] for questions and concerns.  For individual provider: See Shea Evans

## 2018-09-25 DIAGNOSIS — E782 Mixed hyperlipidemia: Secondary | ICD-10-CM

## 2018-09-25 DIAGNOSIS — R55 Syncope and collapse: Secondary | ICD-10-CM

## 2018-09-25 DIAGNOSIS — N182 Chronic kidney disease, stage 2 (mild): Secondary | ICD-10-CM

## 2018-09-25 DIAGNOSIS — E039 Hypothyroidism, unspecified: Secondary | ICD-10-CM

## 2018-09-25 LAB — COMPREHENSIVE METABOLIC PANEL
ALT: 5 U/L (ref 0–44)
AST: 14 U/L — ABNORMAL LOW (ref 15–41)
Albumin: 2.5 g/dL — ABNORMAL LOW (ref 3.5–5.0)
Alkaline Phosphatase: 50 U/L (ref 38–126)
Anion gap: 5 (ref 5–15)
BUN: 5 mg/dL — ABNORMAL LOW (ref 8–23)
CO2: 24 mmol/L (ref 22–32)
Calcium: 7.6 mg/dL — ABNORMAL LOW (ref 8.9–10.3)
Chloride: 103 mmol/L (ref 98–111)
Creatinine, Ser: 0.62 mg/dL (ref 0.44–1.00)
GFR calc Af Amer: >60 mL/min (ref >60)
GFR calc non Af Amer: >60 mL/min (ref >60)
Glucose, Bld: 108 mg/dL — ABNORMAL HIGH (ref 70–99)
Potassium: 4 mmol/L (ref 3.5–5.1)
Sodium: 132 mmol/L — ABNORMAL LOW (ref 135–145)
Total Bilirubin: 0.5 mg/dL (ref 0.3–1.2)
Total Protein: 5.9 g/dL — ABNORMAL LOW (ref 6.5–8.1)

## 2018-09-25 LAB — CBC WITH DIFFERENTIAL/PLATELET
Abs Immature Granulocytes: 0.05 10*3/uL (ref 0.00–0.07)
Basophils Absolute: 0.1 10*3/uL (ref 0.0–0.1)
Basophils Relative: 1 %
Eosinophils Absolute: 0.7 10*3/uL — ABNORMAL HIGH (ref 0.0–0.5)
Eosinophils Relative: 8 %
HCT: 31.8 % — ABNORMAL LOW (ref 36.0–46.0)
Hemoglobin: 9.9 g/dL — ABNORMAL LOW (ref 12.0–15.0)
Immature Granulocytes: 1 %
Lymphocytes Relative: 18 %
Lymphs Abs: 1.5 10*3/uL (ref 0.7–4.0)
MCH: 27.4 pg (ref 26.0–34.0)
MCHC: 31.1 g/dL (ref 30.0–36.0)
MCV: 88.1 fL (ref 80.0–100.0)
Monocytes Absolute: 0.6 10*3/uL (ref 0.1–1.0)
Monocytes Relative: 7 %
Neutro Abs: 5.7 10*3/uL (ref 1.7–7.7)
Neutrophils Relative %: 65 %
Platelets: 322 10*3/uL (ref 150–400)
RBC: 3.61 MIL/uL — ABNORMAL LOW (ref 3.87–5.11)
RDW: 16.4 % — ABNORMAL HIGH (ref 11.5–15.5)
WBC: 8.5 10*3/uL (ref 4.0–10.5)
nRBC: 0 % (ref 0.0–0.2)

## 2018-09-25 LAB — MAGNESIUM: Magnesium: 1.6 mg/dL — ABNORMAL LOW (ref 1.7–2.4)

## 2018-09-25 LAB — PHOSPHORUS: Phosphorus: 2 mg/dL — ABNORMAL LOW (ref 2.5–4.6)

## 2018-09-25 MED ORDER — POTASSIUM PHOSPHATES 15 MMOLE/5ML IV SOLN
20.0000 mmol | Freq: Once | INTRAVENOUS | Status: AC
  Administered 2018-09-25: 20 mmol via INTRAVENOUS
  Filled 2018-09-25: qty 6.67

## 2018-09-25 MED ORDER — MAGNESIUM SULFATE 2 GM/50ML IV SOLN
2.0000 g | Freq: Once | INTRAVENOUS | Status: AC
  Administered 2018-09-25: 2 g via INTRAVENOUS
  Filled 2018-09-25: qty 50

## 2018-09-25 MED ORDER — ADULT MULTIVITAMIN W/MINERALS CH
1.0000 | ORAL_TABLET | Freq: Every day | ORAL | Status: DC
  Administered 2018-09-25 – 2018-09-26 (×2): 1 via ORAL
  Filled 2018-09-25 (×2): qty 1

## 2018-09-25 MED ORDER — SODIUM CHLORIDE 0.9 % IV SOLN
1.0000 g | Freq: Every day | INTRAVENOUS | Status: DC
  Administered 2018-09-25 – 2018-09-26 (×2): 1000 mg via INTRAVENOUS
  Filled 2018-09-25 (×2): qty 1

## 2018-09-25 NOTE — Evaluation (Signed)
Occupational Therapy Evaluation Patient Details Name: [name] MRN: [id number] DOB: Aug 06, 1927 Today's Date: 09/25/2018    History of Present Illness 84 year old female with complex diverticulitis with perforation since September of this year, last hospitalized and discharged on 11/15 on medical therapy with antibiotic for at least 4 weeks and outpatient ID follow-up. Pt now reports she started having nausea and persistent watery diarrhea for the past few days. CT of the abdomen and pelvis showed contrast showed persistent rectosigmoid perforated diverticulitis and ill-defined pericolonic pelvic fluid collection. Pt admitted for further management and persistent diverticulitis. PMHx includes A. fib not on anticoagulation, aortic stenosis, hypertension, hypothyroidism.      Clinical Impression   This 84 y/o female presents with the above. PTA pt reports she was mostly furniture walking at home (has rollator for longer distance mobility), reports she was completing ADLs without assist. Pt very pleasant during session and agreeable to working with therapy. Pt currently requiring minA for short distance functional mobility in room using RW; requiring setup-minguard for seated UB ADL and modA for LB ADLs at this time. Pt will benefit from continued OT services. She reports her son lives with her and is home 24hr but will need to confirm; feel pt will require 24hr supervision/assist initially at time of discharge. Recommend follow up [place] to maximize her safety and independence with ADLs and mobility after discharge. Pt may also be appropriate for [facility] Program with [facility] if eligible. Will follow.     Follow Up Recommendations  Home health OT;Supervision/Assistance - 24 hour(pending pt has 24hr assist)    Equipment Recommendations  None recommended by OT           Precautions / Restrictions Precautions Precautions: Fall Precaution Comments: per chart pt with x1 fall at home since  recent d/c home Restrictions Weight Bearing Restrictions: No      Mobility Bed Mobility Overal bed mobility: Needs Assistance Bed Mobility: Supine to Sit     Supine to sit: Min assist     General bed mobility comments: cues for LEs over EOB and assist to elevate trunk; pt requires increased time to sit EOB prior to mobility due to pt reports of hx of vertigo with intial sitting  Transfers Overall transfer level: Needs assistance Equipment used: Rolling walker (2 wheeled) Transfers: Sit to/from [facility] Sit to Stand: Min assist Stand pivot transfers: Min assist       General transfer comment: VCs hand placement; assist to rise and steady at RW    Balance Overall balance assessment: Needs assistance Sitting-balance support: Feet supported Sitting balance-Leahy Scale: Good     Standing balance support: Bilateral upper extremity supported Standing balance-Leahy Scale: Poor                             ADL either performed or assessed with clinical judgement   ADL Overall ADL's : Needs assistance/impaired Eating/Feeding: Set up;Sitting   Grooming: Set up;Sitting   Upper Body Bathing: Min guard;Sitting   Lower Body Bathing: Minimal assistance;Sit to/from stand   Upper Body Dressing : Min guard;Sitting   Lower Body Dressing: Moderate assistance;Sit to/from stand Lower Body Dressing Details (indicate cue type and reason): pt required assist to don socks and to thread mesh underwear over LEs; steadying assist provided in standing while pt advances over hips Toilet Transfer: Minimal assistance;Stand-pivot;RW;BSC Toilet Transfer Details (indicate cue type and reason): simulated in transfer to [place] and Hygiene:  Minimal assistance;Sit to/from stand       Functional mobility during ADLs: Minimal assistance;Rolling walker General ADL Comments: pt performing stand pivot to recliner and then practicing taking  few steps forwards/back from recliner using RW     Vision         Perception     Praxis      Pertinent Vitals/Pain Pain Assessment: No/denies pain     Hand Dominance     Extremity/Trunk Assessment Upper Extremity Assessment Upper Extremity Assessment: Generalized weakness   Lower Extremity Assessment Lower Extremity Assessment: Defer to PT evaluation       Communication Communication Communication: No difficulties   Cognition Arousal/Alertness: Awake/alert Behavior During Therapy: WFL for tasks assessed/performed Overall Cognitive Status: Within Functional Limits for tasks assessed                                     General Comments       Exercises     Shoulder Instructions      Home Living Family/patient expects to be discharged to:: Private residence Living Arrangements: Children Available Help at Discharge: Family;Available 24 hours/day Type of Home: House Home Access: Ramped entrance     Home Layout: One level     Bathroom Shower/Tub: Occupational psychologist: Standard     Home Equipment: Environmental consultant - 4 wheels;Shower seat - built in          Prior Functioning/Environment Level of Independence: Independent with assistive device(s);Needs assistance  Gait / Transfers Assistance Needed: pt reports she has a rollator but doesn't typically use in the home (tends to hold onto furniture instead) ADL's / [place] Needed: pt reports she does not require assist for ADLs            OT Problem List: Decreased strength;Decreased range of motion;Decreased activity tolerance;Impaired balance (sitting and/or standing)      OT Treatment/Interventions: Self-care/ADL training;Therapeutic exercise;DME and/or AE instruction;Therapeutic activities;Visual/perceptual remediation/compensation;Patient/family education    OT Goals(Current goals can be found in the care plan section) Acute Rehab OT Goals Patient Stated Goal: to  get better OT Goal Formulation: With patient Time For Goal Achievement: 10/09/18 Potential to Achieve Goals: Good  OT Frequency: Min 2X/week   Barriers to D/C:            Co-evaluation              AM-PAC OT "6 Clicks" Daily Activity     Outcome Measure Help from another person eating meals?: None Help from another person taking care of personal grooming?: A Little Help from another person toileting, which includes using toliet, bedpan, or urinal?: A Lot Help from another person bathing (including washing, rinsing, drying)?: A Little Help from another person to put on and taking off regular upper body clothing?: None Help from another person to put on and taking off regular lower body clothing?: A Lot 6 Click Score: 18   End of Session Equipment Utilized During Treatment: Gait belt;Rolling walker Nurse Communication: Mobility status  Activity Tolerance: Patient tolerated treatment well Patient left: in chair;with call bell/phone within reach;with chair alarm set  OT Visit Diagnosis: Muscle weakness (generalized) (M62.81)                Time: 2992-4268 OT Time Calculation (min): 26 min Charges:  OT General Charges $OT Visit: 1 Visit OT Evaluation $OT Eval Moderate Complexity: 1 Mod OT Treatments $Self  Care/Home Management : 8-22 mins  Lou Cal, OT [facility] Services Pager [phone] Office [phone]   Raymondo Band 09/25/2018, 1:27 PM

## 2018-09-25 NOTE — Care Management Note (Signed)
Case Management Note  Patient Details  Name: [name] MRN: [id number] Date of Birth: [birth year]  Subjective/Objective:                  Discharge planning  Action/Plan: States that she [facility] for [facility] care but felt like that they did not communicate with her well.  Also aide was supposed to come out and help with her bath and did not come/Karen Byrd with [facility] to come and talk with client about these concerns.  Expected Discharge Date:                  Expected Discharge Plan:     In-House Referral:     Discharge planning Services     Post Acute Care Choice:    Choice offered to:     DME Arranged:    DME Agency:     HH Arranged:    HH Agency:     Status of Service:     If discussed at [employer], dates discussed:    Additional Comments:  Leeroy Cha, RN 09/25/2018, 1:46 PM

## 2018-09-25 NOTE — Progress Notes (Signed)
PROGRESS NOTE    [name]  VFI:[id number] DOB: Apr 26, 1927 DOA: 09/22/2018 PCP: [name], NP  Brief Narrative: HPI per Dr. Gala Romney on 09/22/18 [name] is a 84 y.o. female with medical history significant of complex diverticulitis with perforation since September of this year who was last admitted on November 8 this year and discharged November 15 with IV Rocephin and Flagyl per ID team to continue home treatment.  Patient is not a candidate for surgery and is undergoing treatment with conservative measures.  Also has history of aortic stenosis, atrial fibrillation not on anticoagulation hypothyroidism hypertension leukocytosis electrolyte abnormalities including hypokalemia and hypomagnesemia.  Patient came to the ER today due to persistent diarrhea and nausea.  Abdominal pain is less.  She is still on IV antibiotics at home to be taken until December 15.  Patient could not handle it.  She feels dehydrated and weak.  Not able to continue current treatment at home.  She was seen in the ER and evaluated.  Surgical consultation was sought for and recommendation was to readmit patient to the hospital for reevaluation.  **Repeat CT Scan of the abdomen and pelvis showed contrast showed persistent rectosigmoid perforated diverticulitis and ill-defined pericolonic pelvic fluid collection measuring 4.5 x5.1 cm, also showed descending colitis but without bowel obstruction.  There is also abnormal air tracking to cystic left ovarian mass.  Patient refused evaluation on it in the past.  Admitted for further management and persistent diverticulitis.  General surgery consulted and signed off as patient is declining surgical intervention. Palliative Care consulted for [place] discussion and patient wishes to be Full Code and has rescinded her DNR/DNI and wishes to go Home.  Subluxation has been complicated by C. difficile colitis and I discussed the case with Dr. Drucilla Schmidt who  recommends changing IV ceftriaxone and IV metronidazole to IV Invanz and continue p.o. vancomycin for total 10 days.  Assessment & Plan:   Principal Problem:   Acute diverticulitis Active Problems:   Near syncope   HLD (hyperlipidemia)   Hypothyroidism   Atrial fibrillation, chronic   Essential hypertension   CKD (chronic kidney disease) stage 2, GFR 60-89 ml/min   Hypokalemia   Clostridium difficile colitis   Acute recurrent diverticulitis with perforation and?  Abscess -He was resumed on IV Rocephin and Flagyl that she was discharged on as outpatient but this is not been changed IV Invanz per ID recommendations -Patient clearly refusing surgery again.   -IR consulted who reviewed the images and recommended that there was no drainable abscess. -Continued full liquid diet yesterday and will advance to Soft Diet today -She continues to refuse surgery so general surgery is signed off the case -Palliative involved and patient does not want any palliative measures and rescinded her DNR and is now full code -Diet is advanced today -We will obtain PT/OT evaluations -May need a formal ID consultation to discharge but Dr. Drucilla Schmidt recommends continue IV Invanz as well as p.o. vancomycin for now  C. difficile Enteritis -Has positive PCR and Positive Antigen but negative toxin.   -Given her clinical presentation and prolonged antibiotic she was started on treatment with oral vancomycin for 10 days duration.   -Leukocytosis has resolved and improved to 8.5 from 20.7 -Continue to Maintain enteric precautions -C/w Treatment with Oral Vancomycin for now and I discussed the case with ID Dr. Tommy Medal  Near Syncope -Secondary to dehydration.   -Receiving IV fluids with NS + 40 mEQ of KCl at 75  mL/hr -Repeat Orthostatic Vital Signs in AM -PT/OT to evaluate and Treat  Atrial Fibrillation, Chronic -Rate controlled on Metoprolol Succinate 75 mg po Daily.   -Not on anticoagulation due to history  of?  GI bleed. -Was in rapid A. fib during last hospitalization. -Continue to Monitor   Hypothyroidism  -Continue Levothyroxine 75 mcg po Daily  Hyponatremia -Patient's Na+ has been ranging from 131-133 -C/w IVF Hydration as above -Repeat CMP in AM   CKD (chronic kidney disease) stage 2, GFR 60-89 ml/min -Stable at baseline as BUN/Cr was 5/0.62 -Avoid Nephrotoxic Medications/Agents/Contrasts -Repeat CMP in AM   Nonsevere (moderate) malnutrition in the context of acute illness and injury -Nutrition is consulted for further evaluation recommendations -Continue with boost breeze p.o. 3 times daily, Ensure Enlive p.o. twice daily, Magic cup twice daily with meals, and multivitamin with minerals daily  Hypophosphatemia -Patient's phosphorus level this morning was 2.0 -Replete with IV K-Phos 20 mmol -Continue to monitor and replete as necessary -Repeat phosphorus level in a.m.  Hypomagnesemia -Patient magnesium level this morning is 1.6 -Replete with IV mag sulfate 2 g -Continue monitor replete as necessary -Repeat magnesium level in the a.m.  DVT prophylaxis: Enoxaparin 40 mg sq q24h Code Status: FULL CODE Family Communication: No family present at bedside  Disposition Plan: Anticipate D/C Home in the next 24-48 hours if improved and stable   Consultants:   General Surgery  Palliative Care Medicine  Interventional radiology  Discussed the Case with ID Dr. Tommy Medal   Procedures:  None   Antimicrobials: Anti-infectives (From admission, onward)   Start     Dose/Rate Route Frequency Ordered Stop   09/25/18 1230  ertapenem (INVANZ) 1,000 mg in sodium chloride 0.9 % 100 mL IVPB     1 g 200 mL/hr over 30 Minutes Intravenous Daily 09/25/18 1209     09/23/18 1400  vancomycin (VANCOCIN) 50 mg/mL oral solution 125 mg     125 mg Oral 4 times daily 09/23/18 1058 10/03/18 1359   09/23/18 1000  cefTRIAXone (ROCEPHIN) 2 g in sodium chloride 0.9 % 100 mL IVPB  Status:   Discontinued     2 g 200 mL/hr over 30 Minutes Intravenous Every 24 hours 09/22/18 2351 09/25/18 1209   09/23/18 0600  metroNIDAZOLE (FLAGYL) tablet 500 mg  Status:  Discontinued     500 mg Oral Every 8 hours 09/22/18 2338 09/25/18 1209   09/22/18 2345  cefTRIAXone (ROCEPHIN) IVPB  Status:  Discontinued    Note to Pharmacy:  Indication:  diverticular abscesses  Last Day of Therapy:  10/21/18 Labs - Once weekly:  CBC/D and BMP     2 g Intravenous Every 24 hours 09/22/18 2338 09/22/18 2350   09/22/18 1930  vancomycin (VANCOCIN) 1,250 mg in sodium chloride 0.9 % 250 mL IVPB     1,250 mg 166.7 mL/hr over 90 Minutes Intravenous  Once 09/22/18 1924 09/22/18 2209   09/22/18 1930  ceFEPIme (MAXIPIME) 2 g in sodium chloride 0.9 % 100 mL IVPB     2 g 200 mL/hr over 30 Minutes Intravenous  Once 09/22/18 1924 09/22/18 2015   09/22/18 1930  metroNIDAZOLE (FLAGYL) IVPB 500 mg     500 mg 100 mL/hr over 60 Minutes Intravenous  Once 09/22/18 1924 09/22/18 2046     Subjective: Seen and examined at bedside and is doing okay.  Denies chest pain, lightheadedness or dizziness.  Had some mild abdominal pain.  Was wanting to go home.  Tolerated her diet well yesterday  and happy that is going to be advanced later today.  Wanting to work with therapy.  Objective: Vitals:   09/24/18 2105 09/24/18 2107 09/25/18 0539 09/25/18 1409  BP: (!) 152/84 (!) 152/84 (!) 152/79 (!) 155/101  Pulse: 74 74 81 92  Resp: 20 20 (!) 22   Temp: 98.4 F (36.9 C) 98.4 F (36.9 C) 97.9 F (36.6 C) 97.7 F (36.5 C)  TempSrc: Oral Oral Oral Oral  SpO2: 99% (!) 73% 100% 100%  Weight:      Height:        Intake/Output Summary (Last 24 hours) at 09/25/2018 1526 Last data filed at 09/25/2018 1500 Gross per 24 hour  Intake 4257.5 ml  Output -  Net 4257.5 ml   Filed Weights   09/22/18 1535  Weight: 59.9 kg   Examination: Physical Exam:  Constitutional: WN/WD overweight elderly Caucasian female in NAD and appears calm    Eyes: Lids and conjunctivae normal, sclerae anicteric  ENMT: External Ears, Nose appear normal. Grossly normal hearing. Mucous membranes are moist.  Neck: Appears normal, supple, no cervical masses, normal ROM, no appreciable thyromegaly; no JVD Respiratory: Diminished to auscultation bilaterally, no wheezing, rales, rhonchi or crackles. Normal respiratory effort and patient is not tachypenic. No accessory muscle use.  Cardiovascular: RRR, no murmurs / rubs / gallops. S1 and S2 auscultated. No appreciable extremity edema.  Abdomen: Soft, mildly tender, non-distended. No masses palpated. No appreciable hepatosplenomegaly. Bowel sounds positive x4.  GU: Deferred. Musculoskeletal: No clubbing / cyanosis of digits/nails. No joint deformity upper and lower extremities.   Skin: No rashes, lesions, ulcers on a limited skin evaluation. No induration; Warm and dry.  Neurologic: CN 2-12 grossly intact with no focal deficits. Romberg sign and cerebellar reflexes not assessed.  Psychiatric: Normal judgment and insight. Alert and oriented x 2. Normal mood and appropriate affect.   Data Reviewed: I have personally reviewed following labs and imaging studies  CBC: Recent Labs  Lab 09/22/18 1622 09/22/18 1635 09/23/18 0558 09/24/18 0422 09/25/18 0914  WBC 20.7*  --  13.9* 10.3 8.5  NEUTROABS  --   --   --   --  5.7  HGB 11.9* 12.9 10.4* 9.6* 9.9*  HCT 38.0 38.0 33.7* 31.2* 31.8*  MCV 86.6  --  87.1 86.2 88.1  PLT 273  --  268 303 355   Basic Metabolic Panel: Recent Labs  Lab 09/22/18 1622 09/22/18 1635 09/23/18 0558 09/24/18 0422 09/25/18 0914  NA 131* 131* 132* 133* 132*  K 3.3* 3.2* 2.8* 3.7 4.0  CL 93* 92* 100 104 103  CO2 27  --  26 25 24   GLUCOSE 121* 123* 99 107* 108*  BUN 17 18 15 11  5*  CREATININE 0.86 0.80 0.68 0.60 0.62  CALCIUM 8.2*  --  7.7* 7.4* 7.6*  MG 1.6*  --  1.6* 2.3 1.6*  PHOS  --   --   --   --  2.0*   GFR: Estimated Creatinine Clearance: 38.6 mL/min (by C-G  formula based on SCr of 0.62 mg/dL). Liver Function Tests: Recent Labs  Lab 09/23/18 0558 09/25/18 0914  AST 20 14*  ALT 7 5  ALKPHOS 50 50  BILITOT 0.7 0.5  PROT 5.9* 5.9*  ALBUMIN 2.3* 2.5*   No results for input(s): LIPASE, AMYLASE in the last 168 hours. No results for input(s): AMMONIA in the last 168 hours. Coagulation Profile: No results for input(s): INR, PROTIME in the last 168 hours. Cardiac Enzymes: No results for  input(s): CKTOTAL, CKMB, CKMBINDEX, TROPONINI in the last 168 hours. BNP (last 3 results) No results for input(s): PROBNP in the last 8760 hours. HbA1C: No results for input(s): HGBA1C in the last 72 hours. CBG: Recent Labs  Lab 09/22/18 1608  GLUCAP 112*   Lipid Profile: No results for input(s): CHOL, HDL, LDLCALC, TRIG, CHOLHDL, LDLDIRECT in the last 72 hours. Thyroid Function Tests: No results for input(s): TSH, T4TOTAL, FREET4, T3FREE, THYROIDAB in the last 72 hours. Anemia Panel: No results for input(s): VITAMINB12, FOLATE, FERRITIN, TIBC, IRON, RETICCTPCT in the last 72 hours. Sepsis Labs: Recent Labs  Lab 09/22/18 1635 09/22/18 1848  LATICACIDVEN 1.88 1.42    Recent Results (from the past 240 hour(s))  C difficile quick scan w PCR reflex     Status: Abnormal   Collection Time: 09/22/18  8:14 PM  Result Value Ref Range Status   C Diff antigen POSITIVE (A) NEGATIVE Final   C Diff toxin NEGATIVE NEGATIVE Final   C Diff interpretation Results are indeterminate. See PCR results.  Final    Comment: Performed at [facility], [place] [address], [place], [place] [address]  C. Diff by PCR, Reflexed     Status: Abnormal   Collection Time: 09/22/18  8:14 PM  Result Value Ref Range Status   Toxigenic C. Difficile by PCR POSITIVE (A) NEGATIVE Final    Comment: Positive for toxigenic C. difficile with little to no toxin production. Only treat if clinical presentation suggests symptomatic illness. Performed at [facility], [place] [address], [place], [place] [address]     Radiology Studies: Dg Chest Port 1 View  Result Date: 09/23/2018 CLINICAL DATA:  PICC line placement. EXAM: PORTABLE CHEST 1 VIEW COMPARISON:  09/12/2018 FINDINGS: Right-sided PICC line terminates in the expected location of the right atrium. Cardiomediastinal silhouette is normal. Mediastinal contours appear intact. There is no evidence of focal airspace consolidation, pleural effusion or pneumothorax. Osseous structures are without acute abnormality. Soft tissues are grossly normal. IMPRESSION: Right PICC line terminates in the expected location of the proximal right atrium. No evidence of pneumothorax. Electronically Signed   By: Fidela Salisbury M.D.   On: 09/23/2018 20:54   Scheduled Meds: . ALPRAZolam  0.25 mg Oral QHS  . aspirin  81 mg Oral Daily  . enoxaparin (LOVENOX) injection  40 mg Subcutaneous QHS  . feeding supplement  1 Container Oral TID BM  . feeding supplement (ENSURE ENLIVE)  237 mL Oral BID BM  . gabapentin  100 mg Oral QHS  . levothyroxine  75 mcg Oral QAC breakfast  . metoprolol succinate  75 mg Oral Daily  . multivitamin with minerals  1 tablet Oral Daily  . olopatadine  1 drop Both Eyes BID  . vancomycin  125 mg Oral QID   Continuous Infusions: . 0.9 % NaCl with KCl 40 mEq / L 75 mL/hr (09/25/18 1315)  . ertapenem 1,000 mg (09/25/18 1320)    LOS: 3 days   Kerney Elbe, DO [facility] PAGER is on [place]  If 7PM-7AM, please contact night-coverage [email or web address] Password [facility] 09/25/2018, 3:26 PM

## 2018-09-25 NOTE — Progress Notes (Signed)
Initial Nutrition Assessment  DOCUMENTATION CODES:   Non-severe (moderate) malnutrition in context of acute illness/injury  INTERVENTION:   -Continue Boost Breeze po TID, each supplement provides 250 kcal and 9 grams of protein -Continue Ensure Enlive po BID, each supplement provides 350 kcal and 20 grams of protein -Provide Magic cup BID with meals, each supplement provides 290 kcal and 9 grams of protein -Provide Multivitamin with minerals daily  NUTRITION DIAGNOSIS:   Moderate Malnutrition related to diarrhea(diverticulitis) as evidenced by energy intake < or equal to 75% for > or equal to 1 month, mild muscle depletion.  GOAL:   Patient will meet greater than or equal to 90% of their needs  MONITOR:   PO intake, Supplement acceptance, Weight trends, Labs, I & O's  REASON FOR ASSESSMENT:   Malnutrition Screening Tool   ASSESSMENT:   84 year old female with complex diverticulitis with perforation since September of this year, last hospitalized and discharged on 11/15 on IV Rocephin and Flagyl per ID.  Also has history of A. fib not on anticoagulation, aortic stenosis, hypertension, hypothyroidism.   Patient currently not eating well, refusing some meals. On 11/25 pt consumed ~85% of meals. Pt continues to have diarrhea, is c.diff+. Diarrhea related to c.diff and IV antibiotics. Pt in the past has not liked Ensure or Boost drinks but is currently trying them.  Also, ordered Magic cups on meal trays.   Pt's UBW is 150 lb. Pt currently weighs 132 lb. Weight records show that patient has weighed ~132-133 lb for the majority of the year. Pt's weight did decrease to 119 earlier this month.  Medications reviewed. Labs reviewed: Low Na, Phos, Mg  NUTRITION - FOCUSED PHYSICAL EXAM:    Most Recent Value  Orbital Region  No depletion  Upper Arm Region  No depletion  Thoracic and Lumbar Region  Unable to assess  Buccal Region  No depletion  Temple Region  Mild depletion   Clavicle Bone Region  Moderate depletion  Clavicle and Acromion Bone Region  Mild depletion  Scapular Bone Region  Unable to assess  Dorsal Hand  Mild depletion  Patellar Region  Mild depletion  Anterior Thigh Region  Mild depletion  Posterior Calf Region  Mild depletion  Edema (RD Assessment)  None       Diet Order:   Diet Order            DIET SOFT Room service appropriate? Yes; Fluid consistency: Thin  Diet effective now              EDUCATION NEEDS:   No education needs have been identified at this time  Skin:  Skin Assessment: Reviewed RN Assessment  Last BM:  11/26 -diarrhea  Height:   Ht Readings from Last 1 Encounters:  09/22/18 5' (1.524 m)    Weight:   Wt Readings from Last 1 Encounters:  09/22/18 59.9 kg    Ideal Body Weight:  45.5 kg  BMI:  Body mass index is 25.78 kg/m.  Estimated Nutritional Needs:   Kcal:  1600-1800  Protein:  80-90g  Fluid:  1.8L/day   Clayton Bibles, MS, RD, LDN [place] [facility] Pager: [phone] After Hours Pager: [phone]

## 2018-09-25 NOTE — Care Management Important Message (Signed)
Important Message  Patient Details  Name: [name] MRN: [id number] Date of Birth: 30-Nov-1926   Medicare Important Message Given:  Yes    Ori Kreiter 09/25/2018, 10:02 AM

## 2018-09-25 NOTE — Evaluation (Signed)
Physical Therapy Evaluation Patient Details Name: [name] MRN: [id number] DOB: [birth year] Today's Date: 09/25/2018   History of Present Illness  84 year old female with complex diverticulitis with perforation since September of this year, last hospitalized and discharged on 11/15 on medical therapy with antibiotic for at least 4 weeks and outpatient ID follow-up. Pt now reports she started having nausea and persistent watery diarrhea for the past few days. CT of the abdomen and pelvis showed contrast showed persistent rectosigmoid perforated diverticulitis and ill-defined pericolonic pelvic fluid collection. Pt admitted for further management and persistent diverticulitis. PMHx includes A. fib not on anticoagulation, aortic stenosis, hypertension, hypothyroidism.     Clinical Impression  Pt tolerated session well, excited to have gotten up and walked today. Still generalized weakness, recommend RW at this time until pt gets tronger and improves balance on her feet. Recommend someone with her initially as well. Encouraged pt to get OOB with nursing staff assistance and even walking hallway with nursing as well. Will continue to follow for strengthening, activity tolerance, and balance training.     Follow Up Recommendations Home health PT    Equipment Recommendations  (rollator in room)    Recommendations for Other Services       Precautions / Restrictions Precautions Precautions: Fall Precaution Comments: per chart pt with x1 fall at home since recent d/c home Restrictions Weight Bearing Restrictions: No      Mobility  Bed Mobility Overal bed mobility: Needs Assistance Bed Mobility: Supine to Sit     Supine to sit: Min assist Sit to supine: Supervision;HOB elevated   General bed mobility comments: no reports of "dizziness" but pt reports she has had dizziness in the past when she moves from sitting to supine or supine to sitting.   Transfers Overall transfer level:  Needs assistance Equipment used: Rolling walker (2 wheeled) Transfers: Sit to/from [facility] Sit to Stand: Min assist;Min guard Stand pivot transfers: Min assist       General transfer comment: VCs hand placement; assist to rise and steady at [place]. Min Guard be teh second time we moved and stood   Ambulation/Gait Ambulation/Gait assistance: Buyer, retail (Feet): 80 Feet Assistive device: Rolling walker (2 wheeled) Gait Pattern/deviations: Step-through pattern;Decreased stride length Gait velocity: decreased    General Gait Details: increased time. and limited distance due to " I don't want to over do it the first time I have been walking "   Stairs            Wheelchair Mobility    Modified Rankin (Stroke Patients Only)       Balance Overall balance assessment: Needs assistance Sitting-balance support: Feet supported Sitting balance-Leahy Scale: Good     Standing balance support: Bilateral upper extremity supported Standing balance-Leahy Scale: Fair                               Pertinent Vitals/Pain Pain Assessment: No/denies pain    Home Living Family/patient expects to be discharged to:: Private residence Living Arrangements: Children(pt's son lives with her) Available Help at Discharge: Family;Available 24 hours/day Type of Home: House Home Access: Level entry;Ramped entrance     Home Layout: One level Home Equipment: Walker - 4 wheels;Shower seat - built in      Prior Function Level of Independence: Independent with assistive device(s);Needs assistance   Gait / Transfers Assistance Needed: pt reports she has a rollator but doesn't typically use  in the home (tends to hold onto furniture instead)  ADL's / Homemaking Assistance Needed: pt reports she does not require assist for ADLs        Hand Dominance        Extremity/Trunk Assessment   Upper Extremity Assessment Upper Extremity  Assessment: Generalized weakness    Lower Extremity Assessment Lower Extremity Assessment: Generalized weakness       Communication   Communication: No difficulties  Cognition Arousal/Alertness: Awake/alert Behavior During Therapy: WFL for tasks assessed/performed Overall Cognitive Status: Within Functional Limits for tasks assessed                                 General Comments: very sweet      General Comments      Exercises     Assessment/Plan    PT Assessment Patient needs continued PT services  PT Problem List Decreased strength;Decreased activity tolerance;Decreased mobility;Decreased balance;Decreased knowledge of precautions       PT Treatment Interventions Gait training;Functional mobility training;Therapeutic activities;Therapeutic exercise;Balance training    PT Goals (Current goals can be found in the Care Plan section)  Acute Rehab PT Goals Patient Stated Goal: I just want to be more ready before I go home  PT Goal Formulation: With patient Time For Goal Achievement: 10/08/18 Potential to Achieve Goals: Good    Frequency Min 3X/week   Barriers to discharge        Co-evaluation               AM-PAC PT "6 Clicks" Mobility  Outcome Measure Help needed turning from your back to your side while in a flat bed without using bedrails?: A Little Help needed moving from lying on your back to sitting on the side of a flat bed without using bedrails?: A Little Help needed moving to and from a bed to a chair (including a wheelchair)?: A Little Help needed standing up from a chair using your arms (e.g., wheelchair or bedside chair)?: A Little Help needed to walk in hospital room?: A Little Help needed climbing 3-5 steps with a railing? : A Little 6 Click Score: 18    End of Session   Activity Tolerance: Patient tolerated treatment well Patient left: in bed;with call bell/phone within reach;with bed alarm set Nurse Communication:  Mobility status PT Visit Diagnosis: Other abnormalities of gait and mobility (R26.89);Unsteadiness on feet (R26.81)    Time: 1451-1520 PT Time Calculation (min) (ACUTE ONLY): 29 min   Charges:   PT Evaluation $PT Eval Low Complexity: 1 Low PT Treatments $Gait Training: 8-22 mins        Clide Dales, PT Acute Rehabilitation Services Pager: [phone] Office: [phone] 09/25/2018   Clide Dales 09/25/2018, 4:38 PM
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
## 2018-09-26 LAB — CBC WITH DIFFERENTIAL/PLATELET
Abs Immature Granulocytes: 0.04 10*3/uL (ref 0.00–0.07)
Basophils Absolute: 0.1 10*3/uL (ref 0.0–0.1)
Basophils Relative: 1 %
Eosinophils Absolute: 0.6 10*3/uL — ABNORMAL HIGH (ref 0.0–0.5)
Eosinophils Relative: 8 %
HCT: 31.7 % — ABNORMAL LOW (ref 36.0–46.0)
Hemoglobin: 9.7 g/dL — ABNORMAL LOW (ref 12.0–15.0)
Immature Granulocytes: 1 %
Lymphocytes Relative: 27 %
Lymphs Abs: 2.1 10*3/uL (ref 0.7–4.0)
MCH: 26.3 pg (ref 26.0–34.0)
MCHC: 30.6 g/dL (ref 30.0–36.0)
MCV: 85.9 fL (ref 80.0–100.0)
Monocytes Absolute: 0.7 10*3/uL (ref 0.1–1.0)
Monocytes Relative: 9 %
Neutro Abs: 4.2 10*3/uL (ref 1.7–7.7)
Neutrophils Relative %: 54 %
Platelets: 366 10*3/uL (ref 150–400)
RBC: 3.69 MIL/uL — ABNORMAL LOW (ref 3.87–5.11)
RDW: 16.2 % — ABNORMAL HIGH (ref 11.5–15.5)
WBC: 7.6 10*3/uL (ref 4.0–10.5)
nRBC: 0 % (ref 0.0–0.2)

## 2018-09-26 LAB — COMPREHENSIVE METABOLIC PANEL
ALT: 6 U/L (ref 0–44)
AST: 14 U/L — ABNORMAL LOW (ref 15–41)
Albumin: 2.5 g/dL — ABNORMAL LOW (ref 3.5–5.0)
Alkaline Phosphatase: 49 U/L (ref 38–126)
Anion gap: 5 (ref 5–15)
BUN: <5 mg/dL — ABNORMAL LOW (ref 8–23)
CO2: 27 mmol/L (ref 22–32)
Calcium: 7.8 mg/dL — ABNORMAL LOW (ref 8.9–10.3)
Chloride: 103 mmol/L (ref 98–111)
Creatinine, Ser: 0.6 mg/dL (ref 0.44–1.00)
GFR calc Af Amer: >60 mL/min (ref >60)
GFR calc non Af Amer: >60 mL/min (ref >60)
Glucose, Bld: 94 mg/dL (ref 70–99)
Potassium: 4.3 mmol/L (ref 3.5–5.1)
Sodium: 135 mmol/L (ref 135–145)
Total Bilirubin: 0.5 mg/dL (ref 0.3–1.2)
Total Protein: 5.7 g/dL — ABNORMAL LOW (ref 6.5–8.1)

## 2018-09-26 LAB — MAGNESIUM: Magnesium: 2 mg/dL (ref 1.7–2.4)

## 2018-09-26 LAB — PHOSPHORUS: Phosphorus: 3.2 mg/dL (ref 2.5–4.6)

## 2018-09-26 MED ORDER — ENSURE ENLIVE PO LIQD: 237.0000 mL | Freq: Two times a day (BID) | ORAL | 12 refills | Status: DC

## 2018-09-26 MED ORDER — ERTAPENEM IV (FOR PTA / DISCHARGE USE ONLY): 1.0000 g | INTRAVENOUS | 0 refills | Status: DC

## 2018-09-26 MED ORDER — HEPARIN SOD (PORK) LOCK FLUSH 100 UNIT/ML IV SOLN
250.0000 [IU] | INTRAVENOUS | Status: AC | PRN
  Administered 2018-09-26: 250 [IU]

## 2018-09-26 MED ORDER — VANCOMYCIN 50 MG/ML ORAL SOLUTION: 125.0000 mg | Freq: Four times a day (QID) | ORAL | 0 refills | Status: AC

## 2018-09-26 MED ORDER — ADULT MULTIVITAMIN W/MINERALS CH: 1.0000 | ORAL_TABLET | Freq: Every day | ORAL | 0 refills | Status: DC

## 2018-09-26 NOTE — Discharge Summary (Signed)
Physician Discharge Summary  [name] DUK:[id number] DOB: 11-25-1926 DOA: 09/22/2018  PCP: [name], NP  Admit date: 09/22/2018 Discharge date: 09/26/2018  Admitted From: Home Disposition:  Home with [place] (resumption)  Recommendations for Outpatient Follow-up:  1. Follow up with PCP in 1-2 weeks 2. Follow up with General Surgery in 1-2 weeks 3. Follow up with Infectious Diseases in 1-2 weeks; Continue IV Abx and repeat Scan in about a month 4. Please obtain CMP/CBC, Mag, Phos in one week 5. Please follow up on the following pending results:  Home Health: Yes Equipment/Devices: Already has Rollator and IV Pump; Will write for Ertapenem for 1 month    Discharge Condition: Stable, Guarded CODE STATUS: FULL CODE Diet recommendation: Soft Diet   Brief/Interim Summary: HPI per Dr. Gala Romney on 09/22/18 Sydney Cerrato Morrisis a 84 y.o.femalewith medical history significant ofcomplex diverticulitis with perforation since September of this year who was last admitted on November 8 this year and discharged November 15 with IV Rocephin and Flagyl per ID team to continue home treatment. Patient is not a candidate for surgery and is undergoing treatment with conservative measures. Also has history of aortic stenosis, atrial fibrillation not on anticoagulation hypothyroidism hypertension leukocytosis electrolyte abnormalities including hypokalemia and hypomagnesemia. Patient came to the ER today due to persistent diarrhea and nausea. Abdominal pain is less. She is still on IV antibiotics at home to be taken until December 15. Patient could not handle it. She feels dehydrated and weak. Not able to continue current treatment at home. She was seen in the ER and evaluated. Surgical consultation was sought for and recommendation was to readmit patient to the hospital for reevaluation.  **Repeat CT Scan of the abdomen and pelvis showed contrast showed persistent  rectosigmoid perforated diverticulitis and ill-defined pericolonic pelvic fluid collection measuring 4.5 x5.1 cm, also showed descending colitis but without bowel obstruction. There is also abnormal air tracking to cystic left ovarian mass. Patient refused evaluation on it in the past.  Admitted for further management and persistent diverticulitis. General surgery consulted and signed off as patient is declining surgical intervention. Palliative Care consulted for [place] discussion and patient wishes to be Full Code and has rescinded her DNR/DNI and wishes to go Home.  Hospitalization has been complicated by C. difficile colitis/enteritis and I discussed the case with Dr. Drucilla Schmidt who recommends changing IV ceftriaxone and IV metronidazole to IV Invanz for at least 1 month after discharge and continue p.o. vancomycin for total 10 days.  He evaluated and recommended home health again.  She tolerated her soft diet without issues.  She is deemed medically stable to be discharged at this time she will need to follow-up with PCP, general surgery and infectious disease within 1 to 2 weeks.  She will need repeat imaging of her CT of the abdomen pelvis and continue Invanz for now and will up closely with Dr. Drucilla Schmidt of Infectious Diseases.  Discharge Diagnoses:  Principal Problem:   Acute diverticulitis Active Problems:   Near syncope   HLD (hyperlipidemia)   Hypothyroidism   Atrial fibrillation, chronic   Essential hypertension   CKD (chronic kidney disease) stage 2, GFR 60-89 ml/min   Hypokalemia   Clostridium difficile colitis  Acute recurrent diverticulitis with perforation and? Abscess  -She was resumed on IV Rocephin and Flagyl that she was discharged on as outpatient but this is now been changed IV Invanz per ID recommendations and will continue for at least 30 days after D/C  -Patient  clearly refusing surgery again.  -IR consulted who reviewed the images and recommended that there was no  drainable abscess. -Diet advanced to Soft Diet -She continues to refuse surgery so general surgery is signed off the case -Palliative involved and patient does not want any palliative measures and rescinded her DNR and is now full code -PT recommending Home Health PT  -I discussed with Dr. Tommy Medal and recommends continue IV Invanz for 30 days after D/C as well as p.o. vancomycin for now -Will need Repeat Imaging as an outpatient   C. difficile Enteritis/Colitis  -Has positive PCR and Positive Antigen but negative toxin.  -Given her clinical presentation and prolonged antibiotic she was started on treatment with oral vancomycinfor 10 days duration. -Leukocytosis has resolved and improved to 7.6 from 20.7  -Diarreha is more formed  -Continue to MaintainEnteric precautions -C/w Treatment with Oral Vancomycin for 10 days and I discussed the case with ID Dr. Tommy Medal who will follow the patient in the outpatient setting.   Near Syncope, improved  -Secondary to dehydration. -Receiving IV fluids with NS + 40 mEQ of KCl at 75 mL/hr -Repeat Orthostatic Vital Signs and she was not Orthostatic  -PT/OT to evaluate and Treat and recommending Home Health PT  Atrial Fibrillation, Chronic -Rate controlledon Metoprolol Succinate 75 mg po Daily. -Not on anticoagulation due to history of? GI bleed.  -Was in rapid A. fib during last hospitalization. -Continue to Monitor and follow up with PCP as an outpatient   Hypothyroidism  -Continue Levothyroxine 75 mcg po Daily  Hyponatremia -Patient's Na+ has been ranging from 131-133 but now improved to 135 -Continued IVF Hydration as above and now D/C'd at D/C -Repeat CMP as an outpatient   CKD (chronic kidney disease) stage 2, GFR 60-89 ml/min -Stable at baseline as BUN/Cr was <5/0.60 -Avoid Nephrotoxic Medications/Agents/Contrasts -Repeat CMP as an outpatient   Nonsevere (moderate) malnutrition in the context of acute illness and  injury -Nutrition is consulted for further evaluation recommendations -Continue with boost breeze p.o. 3 times daily, Ensure Enlive p.o. twice daily, Magic cup twice daily with meals, and multivitamin with minerals daily  Hypophosphatemia -Patient's phosphorus level this morning was 3.2 -Continue to monitor and replete as necessary -Repeat phosphorus level as an outpatient   Hypomagnesemia -Patient magnesium level this morning is 2.0 -Continue monitor replete as necessary -Repeat magnesium level as an outpatient  Asymptomatic Bacteruria  -Already on Abx -U/A also had 6-10 Epithelial Cells   Discharge Instructions  Discharge Instructions    Call MD for:  difficulty breathing, headache or visual disturbances   Complete by:  As directed    Call MD for:  extreme fatigue   Complete by:  As directed    Call MD for:  hives   Complete by:  As directed    Call MD for:  persistant dizziness or light-headedness   Complete by:  As directed    Call MD for:  persistant nausea and vomiting   Complete by:  As directed    Call MD for:  redness, tenderness, or signs of infection (pain, swelling, redness, odor or green/yellow discharge around incision site)   Complete by:  As directed    Call MD for:  severe uncontrolled pain   Complete by:  As directed    Call MD for:  temperature >100.4   Complete by:  As directed    Diet - low sodium heart healthy   Complete by:  As directed    SOFT DIET  Discharge instructions   Complete by:  As directed    You were cared for by a hospitalist during your hospital stay. If you have any questions about your discharge medications or the care you received while you were in the hospital after you are discharged, you can call the unit and ask to speak with the hospitalist on call if the hospitalist that took care of you is not available. Once you are discharged, your primary care physician will handle any further medical issues. Please note that NO REFILLS  for any discharge medications will be authorized once you are discharged, as it is imperative that you return to your primary care physician (or establish a relationship with a primary care physician if you do not have one) for your aftercare needs so that they can reassess your need for medications and monitor your lab values.  Follow up with PCP, General Surgery, and Infectious Diseases. Take all medications as prescribed. If symptoms change or worsen please return to the ED for evaluation   Home infusion instructions [facility] May follow [place] Dosing Protocol; May administer Cathflo as needed to maintain patency of vascular access device.; Flushing of vascular access device: per [facility] Protocol: 0.9% NaCl pre/post medica...   Complete by:  As directed    Instructions:  May follow [place] Dosing Protocol   Instructions:  May administer Cathflo as needed to maintain patency of vascular access device.   Instructions:  Flushing of vascular access device: per [facility] Protocol: 0.9% NaCl pre/post medication administration and prn patency; Heparin 100 u/ml, 100m for implanted ports and Heparin 10u/ml, 577mfor all other central venous catheters.   Instructions:  May follow [facility] Anaphylaxis Protocol for First Dose Administration in the home: 0.9% NaCl at 25-50 ml/hr to maintain IV access for protocol meds. Epinephrine 0.3 ml IV/IM PRN and Benadryl 25-50 IV/IM PRN s/s of anaphylaxis.   Instructions:  [facility] Coordinator (RN) to assist per patient IV care needs in the home PRN.   Increase activity slowly   Complete by:  As directed      Allergies as of 09/26/2018      Reactions   Infliximab Other (See Comments)   REACTION: "Enlarged intestines and hernia. Also pushed intestines on lower part of lungs." REACTION: "Enlarged intestines and hernia. Also pushed intestines on lower part of lungs."   Aspirin Nausea And Vomiting   Low Dose is ok Stomach problems   Carvedilol  Other (See Comments)   Fatigue/ ill Fatigue/ ill   Codeine Nausea And Vomiting   "Deathly Sick"   Penicillins Rash   Has patient had a PCN reaction causing immediate rash, facial/tongue/throat swelling, SOB or lightheadedness with hypotension: Y Has patient had a PCN reaction causing severe rash involving mucus membranes or skin necrosis: Y Has patient had a PCN reaction that required hospitalization: N Has patient had a PCN reaction occurring within the last 10 years: N If all of the above answers are "NO", then may proceed with Cephalosporin use.      Medication List    STOP taking these medications   cefTRIAXone  IVPB Commonly known as:  ROCEPHIN   metroNIDAZOLE 500 MG tablet Commonly known as:  FLAGYL     TAKE these medications   acetaminophen 500 MG tablet Commonly known as:  TYLENOL Take 1,000 mg by mouth every 6 (six) hours as needed (joint pain).   ALPRAZolam 0.25 MG tablet Commonly known as:  XANAX Take 1 tablet (  0.25 mg total) by mouth at bedtime as needed for anxiety. What changed:  when to take this   aspirin 81 MG chewable tablet Chew 81 mg by mouth daily.   ertapenem  IVPB Commonly known as:  INVANZ Inject 1 g into the vein daily. Indication:  Recurrent diverticulitis with perforation Last Day of Therapy:  10/13/18 Labs - Once weekly:  CBC/D and BMP, Labs - Every other week:  ESR and CRP   feeding supplement (ENSURE ENLIVE) Liqd Take 237 mLs by mouth 2 (two) times daily between meals. Start taking on:  09/27/2018   gabapentin 100 MG capsule Commonly known as:  NEURONTIN Take 1 capsule (100 mg total) by mouth at bedtime.   levothyroxine 75 MCG tablet Commonly known as:  SYNTHROID, LEVOTHROID Take 1 tablet (75 mcg total) by mouth daily before breakfast.   metoprolol succinate 25 MG 24 hr tablet Commonly known as:  TOPROL-XL Take 3 tablets (75 mg total) by mouth daily.   multivitamin with minerals Tabs tablet Take 1 tablet by mouth daily. Start  taking on:  09/27/2018   Olopatadine HCl 0.2 % Soln Place 1 drop into both eyes daily.   ondansetron 4 MG tablet Commonly known as:  ZOFRAN Take 1 tablet (4 mg total) by mouth every 8 (eight) hours as needed for nausea or vomiting.   vancomycin 50 mg/mL  oral solution Commonly known as:  VANCOCIN Take 2.5 mLs (125 mg total) by mouth 4 (four) times daily for 28 doses.            Home Infusion Instuctions  (From admission, onward)         Start     Ordered   09/26/18 0000  Home infusion instructions [facility] May follow [place] Dosing Protocol; May administer Cathflo as needed to maintain patency of vascular access device.; Flushing of vascular access device: per [facility] Protocol: 0.9% NaCl pre/post medica...    Question Answer Comment  Instructions May follow [place] Dosing Protocol   Instructions May administer Cathflo as needed to maintain patency of vascular access device.   Instructions Flushing of vascular access device: per [facility] Protocol: 0.9% NaCl pre/post medication administration and prn patency; Heparin 100 u/ml, 71m for implanted ports and Heparin 10u/ml, 547mfor all other central venous catheters.   Instructions May follow [facility] Anaphylaxis Protocol for First Dose Administration in the home: 0.9% NaCl at 25-50 ml/hr to maintain IV access for protocol meds. Epinephrine 0.3 ml IV/IM PRN and Benadryl 25-50 IV/IM PRN s/s of anaphylaxis.   Instructions [facility] Infusion Coordinator (RN) to assist per patient IV care needs in the home PRN.      09/26/18 1513          Allergies  Allergen Reactions  . Infliximab Other (See Comments)    REACTION: "Enlarged intestines and hernia. Also pushed intestines on lower part of lungs." REACTION: "Enlarged intestines and hernia. Also pushed intestines on lower part of lungs."  . Aspirin Nausea And Vomiting    Low Dose is ok Stomach problems  . Carvedilol Other (See Comments)    Fatigue/ ill Fatigue/ ill   . Codeine Nausea And Vomiting    "Deathly Sick"  . Penicillins Rash    Has patient had a PCN reaction causing immediate rash, facial/tongue/throat swelling, SOB or lightheadedness with hypotension: Y Has patient had a PCN reaction causing severe rash involving mucus membranes or skin necrosis: Y Has patient had a PCN reaction that required hospitalization: N  Has patient had a PCN reaction occurring within the last 10 years: N If all of the above answers are "NO", then may proceed with Cephalosporin use.    Consultations:  General Surgery  Palliative Care Medicine  Interventional radiology  Discussed the Case with ID Dr. Tommy Medal  Procedures/Studies: Ct Abdomen Pelvis W Contrast  Result Date: 09/22/2018 CLINICAL DATA:  Diarrhea since discharge from hospital 1 week ago. Fell this morning. History of diverticulitis, hiatal hernia, appendectomy. EXAM: CT ABDOMEN AND PELVIS WITH CONTRAST TECHNIQUE: Multidetector CT imaging of the abdomen and pelvis was performed using the standard protocol following bolus administration of intravenous contrast. CONTRAST:  113m ISOVUE-300 IOPAMIDOL (ISOVUE-300) INJECTION 61% COMPARISON:  CT abdomen and pelvis September 07, 2018 and priors. FINDINGS: LOWER CHEST: Small similar bilateral sub pleural effusions. Included heart size is normal. No pericardial effusion. HEPATOBILIARY: Contracted gallbladder with multiple folds in similar small volume pericholecystic fluid and small layering sludge versus stones. Negative liver. PANCREAS: Normal. SPLEEN: Non suspicious. Two subcentimeter cysts versus lymphangioma is measuring to 10 mm. ADRENALS/URINARY TRACT: Kidneys are orthotopic, demonstrating symmetric enhancement. LEFT upper pole scarring. No nephrolithiasis, hydronephrosis or solid renal masses. The unopacified ureters are normal in course and caliber. Delayed imaging through the kidneys demonstrates symmetric prompt contrast excretion within the proximal urinary  collecting system. Urinary bladder is partially distended and unremarkable. Normal adrenal glands. STOMACH/BOWEL: Large hiatal hernia. Severe rectosigmoid wall thickening and inflammatory changes associated with diverticular disease. Pericolonic ill-defined 4.5 x 5.1 cm fluid collection, additional ill-defined pericolonic collections. Extraluminal gas tracks to the LEFT ovarian mass. Mild descending colonic wall thickening and edema. Small and large bowel are normal in course and caliber. VASCULAR/LYMPHATIC: Aortoiliac vessels are normal in course and caliber. Moderate to severe. Calcific atherosclerosis no lymphadenopathy by CT size criteria. REPRODUCTIVE: Uterus is displaced to the RIGHT. Multi-cystic LEFT ovarian 5.2 x 3.5 cm mass inseparable from patient's diverticulitis and abscess. OTHER: Small amount of free fluid in the pelvis. Presacral fat stranding. No intraperitoneal free air. MUSCULOSKELETAL: Nonacute. IMPRESSION: 1. Continued severe rectosigmoid perforated diverticulitis. Ill-defined multiple ill-defined pericolonic pelvic fluid collections measuring to 4.5 x 5.1 cm. Mild descending colitis. No bowel obstruction. 2. Abnormal air tracking to cystic LEFT ovarian mass, possible fistula. Recommend non emergent pelvic ultrasound for further characterization of LEFT ovarian mass. 3. Cholelithiasis and possible acute cholecystitis though, findings are similar to prior CT. 4. Similar small pleural effusions. Aortic Atherosclerosis (ICD10-I70.0). Electronically Signed   By: CElon AlasM.D.   On: 09/22/2018 19:02   Ct Abdomen Pelvis W Contrast  Result Date: 09/07/2018 CLINICAL DATA:  Evaluate for diverticulitis. Abdominal pain and nausea with diarrhea. EXAM: CT ABDOMEN AND PELVIS WITH CONTRAST TECHNIQUE: Multidetector CT imaging of the abdomen and pelvis was performed using the standard protocol following bolus administration of intravenous contrast. CONTRAST:  1012mOMNIPAQUE IOHEXOL 300 MG/ML  SOLN  COMPARISON:  07/31/2018 and 07/24/2018 FINDINGS: Lower chest: Small bilateral pleural effusions with associated atelectasis slightly improved. Known moderate size hiatal hernia unchanged. Stable cardiomegaly. Hepatobiliary: Stable 1 cm hypodensity over the left lobe of the liver likely a cyst or hemangioma. Gallbladder and biliary tree unremarkable. Pancreas: Normal. Spleen: Couple small splenic hypodensities unchanged likely cysts. Adrenals/Urinary Tract: Adrenal glands are normal. Kidneys are normal in size, shape and position without hydronephrosis or nephrolithiasis. Right ureter and bladder are normal. Mild stable prominence of the left ureter unchanged and likely due to the inflammatory/infectious process in the left lower quadrant/pelvis related to the colon. Stomach/Bowel: Moderate size hiatal  hernia unchanged small bowel is normal. Previous appendectomy. Persistent moderate wall thickening and adjacent inflammatory change and mild free fluid involving a significant segment of sigmoid colon in the left lower quadrant/pelvis which is worse. Findings likely due to a significant acute colitis or diverticulitis. There are small air collections along the periphery of the inflamed colon several which are likely extraluminal representing microperforations and developing diverticular abscesses. Some of these peripheral air collections are due to diverticula. Extraluminal air collection between the inflamed colon and left ovary likely additional contained perforation/abscess. There are a few adjacent small lymph nodes. Vascular/Lymphatic: Moderate calcified plaque over the abdominal aorta. Few small periaortic lymph nodes and few small lymph nodes adjacent the sigmoid colon. Reproductive: Stable 2 cm cystic structure over the left lower uterine segment unchanged. Uterus and right ovary unchanged. Persistent multi-cystic left ovary measuring 4 x 5.2 cm. Other: None. Musculoskeletal: Unchanged. IMPRESSION: Continued  evidence of significant wall thickening and adjacent inflammatory change/fluid involving a moderate segment of the sigmoid colon in the left lower quadrant and pelvis. There has been interval worsening of these changes. Several adjacent air/fluid collections along the inflamed sigmoid colon suggesting micro perforations with developing diverticular abscesses. No significant free peritoneal air. Multi-cystic left ovary measuring 4 x 5.2 cm which may represent a low-grade cystic neoplasm. Stable 2 cm cystic structure along the left side of the lower uterine segment. Large hiatal hernia unchanged. Small bilateral pleural effusions with bibasilar atelectasis improved. 1 cm left liver hypodensity unchanged likely a cyst or hemangioma. Two small splenic hypodensities unchanged likely cysts. Aortic Atherosclerosis (ICD10-I70.0). Electronically Signed   By: Marin Olp M.D.   On: 09/07/2018 11:14   Dg Chest Port 1 View  Result Date: 09/23/2018 CLINICAL DATA:  PICC line placement. EXAM: PORTABLE CHEST 1 VIEW COMPARISON:  09/12/2018 FINDINGS: Right-sided PICC line terminates in the expected location of the right atrium. Cardiomediastinal silhouette is normal. Mediastinal contours appear intact. There is no evidence of focal airspace consolidation, pleural effusion or pneumothorax. Osseous structures are without acute abnormality. Soft tissues are grossly normal. IMPRESSION: Right PICC line terminates in the expected location of the proximal right atrium. No evidence of pneumothorax. Electronically Signed   By: Fidela Salisbury M.D.   On: 09/23/2018 20:54   Dg Chest Port 1 View  Result Date: 09/12/2018 CLINICAL DATA:  Status post PICC line placement. EXAM: PORTABLE CHEST 1 VIEW COMPARISON:  11/05/2017 FINDINGS: A right arm PICC line has been placed. The catheter tip is in the lower SVC region. Stable enlargement of the heart with a large hiatal hernia. Blunting at the costophrenic angles is unchanged. Negative  for pulmonary edema. IMPRESSION: PICC line tip is appropriately positioned in the lower SVC region. No acute lung findings. Electronically Signed   By: Markus Daft M.D.   On: 09/12/2018 09:42   [name] Ekg Site Rite  Result Date: 09/10/2018 If Site Rite image not attached, placement could not be confirmed due to current cardiac rhythm.  [name] Ekg Site Rite  Result Date: 09/09/2018 If Site Rite image not attached, placement could not be confirmed due to current cardiac rhythm.    Subjective: Seen and examined at bedside was doing well.  Had no complaints.  States diarrhea is improved.  No chest pain, lightheadedness or dizziness.  Worked well with therapy.  No other concerns or complaints at this time and ready go to home.  Discharge Exam: Vitals:   09/26/18 0939 09/26/18 1532  BP:  (!) 169/92  Pulse: 80 91  Resp: 20 16  Temp:  98.2 F (36.8 C)  SpO2:  100%   Vitals:   09/26/18 0902 09/26/18 0908 09/26/18 0939 09/26/18 1532  BP: (!) 163/100 (!) 155/82  (!) 169/92  Pulse: (!) 104 (!) 123 80 91  Resp: 20 (!) _0 Temp: 98.3 F (36.8 C) 98.2 F (36.8 C)  98.2 F (36.8 C)  TempSrc: Oral Oral  Oral  SpO2: 100% 100%  100%  Weight:      Height:       General: Pt is alert, awake, not in acute distress Cardiovascular: RRR, S1/S2 +, no rubs, no gallops Respiratory: Diminished bilaterally, no wheezing, no rhonchi Abdominal: Soft, NT, ND, bowel sounds + Extremities: no edema, no cyanosis  The results of significant diagnostics from this hospitalization (including imaging, microbiology, ancillary and laboratory) are listed below for reference.    Microbiology: Recent Results (from the past 240 hour(s))  C difficile quick scan w PCR reflex     Status: Abnormal   Collection Time: 09/22/18  8:14 PM  Result Value Ref Range Status   C Diff antigen POSITIVE (A) NEGATIVE Final   C Diff toxin NEGATIVE NEGATIVE Final   C Diff interpretation Results are indeterminate. See PCR results.   Final    Comment: Performed at [facility], [place] [address] [place], [place] [address]  C. Diff by PCR, Reflexed     Status: Abnormal   Collection Time: 09/22/18  8:14 PM  Result Value Ref Range Status   Toxigenic C. Difficile by PCR POSITIVE (A) NEGATIVE Final    Comment: Positive for toxigenic C. difficile with little to no toxin production. Only treat if clinical presentation suggests symptomatic illness. Performed at [facility], [place] [address] [place], [place] [address]     Labs: BNP (last 3 results) No results for input(s): BNP in the last 8760 hours. Basic Metabolic Panel: Recent Labs  Lab 09/22/18 1622 09/22/18 1635 09/23/18 0558 09/24/18 0422 09/25/18 0914 09/26/18 0229  NA 131* 131* 132* 133* 132* 135  K 3.3* 3.2* 2.8* 3.7 4.0 4.3  CL 93* 92* 100 104 103 103  CO2 27  --  _1 GLUCOSE 121* 123* 99 107* 108* 89  BUN _2 5* <5*  CREATININE 0.86 0.80 0.68 0.60 0.62 0.60  CALCIUM 8.2*  --  7.7* 7.4* 7.6* 7.8*  MG 1.6*  --  1.6* 2.3 1.6* 2.0  PHOS  --   --   --   --  2.0* 3.2   Liver Function Tests: Recent Labs  Lab 09/23/18 0558 09/25/18 0914 09/26/18 0229  AST 20 14* 14*  ALT _3 ALKPHOS 50 50 49  BILITOT 0.7 0.5 0.5  PROT 5.9* 5.9* 5.7*  ALBUMIN 2.3* 2.5* 2.5*   No results for input(s): LIPASE, AMYLASE in the last 168 hours. No results for input(s): AMMONIA in the last 168 hours. CBC: Recent Labs  Lab 09/22/18 1622 09/22/18 1635 09/23/18 0558 09/24/18 0422 09/25/18 0914 09/26/18 0229  WBC 20.7*  --  13.9* 10.3 8.5 7.6  NEUTROABS  --   --   --   --  5.7 4.2  HGB 11.9* 12.9 10.4* 9.6* 9.9* 9.7*  HCT 38.0 38.0 33.7* 31.2* 31.8* 31.7*  MCV 86.6  --  87.1 86.2 88.1 85.9  PLT 273  --  268 303 322 366   Cardiac Enzymes: No results for input(s): CKTOTAL, CKMB, CKMBINDEX, TROPONINI in  the last 168 hours. BNP: Invalid input(s): POCBNP CBG: Recent Labs  Lab 09/22/18 1608  GLUCAP 112*   D-Dimer No  results for input(s): DDIMER in the last 72 hours. Hgb A1c No results for input(s): HGBA1C in the last 72 hours. Lipid Profile No results for input(s): CHOL, HDL, LDLCALC, TRIG, CHOLHDL, LDLDIRECT in the last 72 hours. Thyroid function studies No results for input(s): TSH, T4TOTAL, T3FREE, THYROIDAB in the last 72 hours.  Invalid input(s): FREET3 Anemia work up No results for input(s): VITAMINB12, FOLATE, FERRITIN, TIBC, IRON, RETICCTPCT in the last 72 hours. Urinalysis    Component Value Date/Time   COLORURINE YELLOW 09/24/2018 2043   APPEARANCEUR CLOUDY (A) 09/24/2018 2043   LABSPEC 1.024 09/24/2018 2043   PHURINE 5.0 09/24/2018 2043   GLUCOSEU NEGATIVE 09/24/2018 2043   HGBUR MODERATE (A) 09/24/2018 2043   BILIRUBINUR NEGATIVE 09/24/2018 2043   KETONESUR NEGATIVE 09/24/2018 2043   PROTEINUR NEGATIVE 09/24/2018 2043   UROBILINOGEN 0.2 07/03/2014 0313   NITRITE NEGATIVE 09/24/2018 2043   LEUKOCYTESUR MODERATE (A) 09/24/2018 2043   Sepsis Labs Invalid input(s): PROCALCITONIN,  WBC,  LACTICIDVEN Microbiology Recent Results (from the past 240 hour(s))  C difficile quick scan w PCR reflex     Status: Abnormal   Collection Time: 09/22/18  8:14 PM  Result Value Ref Range Status   C Diff antigen POSITIVE (A) NEGATIVE Final   C Diff toxin NEGATIVE NEGATIVE Final   C Diff interpretation Results are indeterminate. See PCR results.  Final    Comment: Performed at [facility], [place] [address] [place], [place] [address]  C. Diff by PCR, Reflexed     Status: Abnormal   Collection Time: 09/22/18  8:14 PM  Result Value Ref Range Status   Toxigenic C. Difficile by PCR POSITIVE (A) NEGATIVE Final    Comment: Positive for toxigenic C. difficile with little to no toxin production. Only treat if clinical presentation suggests symptomatic illness. Performed at [facility], [place] [address] [place], [place] [address]    Time coordinating discharge: 35  minutes  SIGNED:  Kerney Elbe, DO [facility] 09/26/2018, 6:43 PM Pager is on [place]  If 7PM-7AM, please contact night-coverage [email or web address] Password [email or web address]

## 2018-09-26 NOTE — Progress Notes (Signed)
[facility] will follow pt at discharge and are aware of pt's discharge today.

## 2018-09-27 LAB — URINE CULTURE: Culture: 80000 — AB

## 2018-09-28 DIAGNOSIS — Z452 Encounter for adjustment and management of vascular access device: Secondary | ICD-10-CM | POA: Diagnosis not present

## 2018-09-28 DIAGNOSIS — I10 Essential (primary) hypertension: Secondary | ICD-10-CM | POA: Diagnosis not present

## 2018-09-28 DIAGNOSIS — K5792 Diverticulitis of intestine, part unspecified, without perforation or abscess without bleeding: Secondary | ICD-10-CM | POA: Diagnosis not present

## 2018-09-28 DIAGNOSIS — E440 Moderate protein-calorie malnutrition: Secondary | ICD-10-CM | POA: Diagnosis not present

## 2018-09-28 DIAGNOSIS — I482 Chronic atrial fibrillation, unspecified: Secondary | ICD-10-CM | POA: Diagnosis not present

## 2018-09-28 DIAGNOSIS — G8929 Other chronic pain: Secondary | ICD-10-CM | POA: Diagnosis not present

## 2018-09-29 DIAGNOSIS — I10 Essential (primary) hypertension: Secondary | ICD-10-CM | POA: Diagnosis not present

## 2018-09-29 DIAGNOSIS — Z452 Encounter for adjustment and management of vascular access device: Secondary | ICD-10-CM | POA: Diagnosis not present

## 2018-09-29 DIAGNOSIS — I482 Chronic atrial fibrillation, unspecified: Secondary | ICD-10-CM | POA: Diagnosis not present

## 2018-09-29 DIAGNOSIS — K5792 Diverticulitis of intestine, part unspecified, without perforation or abscess without bleeding: Secondary | ICD-10-CM | POA: Diagnosis not present

## 2018-09-29 DIAGNOSIS — E440 Moderate protein-calorie malnutrition: Secondary | ICD-10-CM | POA: Diagnosis not present

## 2018-09-29 DIAGNOSIS — G8929 Other chronic pain: Secondary | ICD-10-CM | POA: Diagnosis not present

## 2018-09-30 DIAGNOSIS — K5792 Diverticulitis of intestine, part unspecified, without perforation or abscess without bleeding: Secondary | ICD-10-CM | POA: Diagnosis not present

## 2018-09-30 DIAGNOSIS — G8929 Other chronic pain: Secondary | ICD-10-CM | POA: Diagnosis not present

## 2018-09-30 DIAGNOSIS — I482 Chronic atrial fibrillation, unspecified: Secondary | ICD-10-CM | POA: Diagnosis not present

## 2018-09-30 DIAGNOSIS — I10 Essential (primary) hypertension: Secondary | ICD-10-CM | POA: Diagnosis not present

## 2018-09-30 DIAGNOSIS — Z452 Encounter for adjustment and management of vascular access device: Secondary | ICD-10-CM | POA: Diagnosis not present

## 2018-09-30 DIAGNOSIS — E440 Moderate protein-calorie malnutrition: Secondary | ICD-10-CM | POA: Diagnosis not present

## 2018-10-01 ENCOUNTER — Telehealth: Payer: Self-pay | Admitting: Nurse Practitioner

## 2018-10-01 ENCOUNTER — Telehealth: Payer: Self-pay

## 2018-10-01 ENCOUNTER — Telehealth: Payer: Self-pay | Admitting: *Deleted

## 2018-10-01 ENCOUNTER — Ambulatory Visit: Payer: Medicare Other | Admitting: Nurse Practitioner

## 2018-10-01 NOTE — Telephone Encounter (Signed)
Calling for verification of IV antibiotics written for Ms Hilger. Verification given from orders written at last hospital visit for  Invanz 1 gram every 24 hours via PICC.  According to Nurse patient will be moving to skilled nursing facility.  Laverle Patter,  RN

## 2018-10-01 NOTE — Telephone Encounter (Signed)
[name] is still waiting for approval to go to SNF. In the meantime, she needs to continue IV abx and lab draw at home as prescribed on hospital discharge.

## 2018-10-01 NOTE — Telephone Encounter (Signed)
Talked to [name] and [employer].  [name] stated she is not able to go to SNF due to an outstanding bill. She feels frustrated and her son is not able to provide the care she needs. [name] stated [facility] is working on getting [facility] to SNF. EMS personnel was sent to home yesterday and today to administer IV abx via PICC line. He stated [facility] will also continue administration of IV abx and draw labs as ordered on hospital discharge starting tomorrow 10/01/2018. Advised [name] to remind [facility] nurse to have lab results faxed to me once completed. He verbalized understanding.

## 2018-10-01 NOTE — Telephone Encounter (Signed)
error 

## 2018-10-02 DIAGNOSIS — E440 Moderate protein-calorie malnutrition: Secondary | ICD-10-CM | POA: Diagnosis not present

## 2018-10-02 DIAGNOSIS — I482 Chronic atrial fibrillation, unspecified: Secondary | ICD-10-CM | POA: Diagnosis not present

## 2018-10-02 DIAGNOSIS — I10 Essential (primary) hypertension: Secondary | ICD-10-CM | POA: Diagnosis not present

## 2018-10-02 DIAGNOSIS — G8929 Other chronic pain: Secondary | ICD-10-CM | POA: Diagnosis not present

## 2018-10-02 DIAGNOSIS — K5792 Diverticulitis of intestine, part unspecified, without perforation or abscess without bleeding: Secondary | ICD-10-CM | POA: Diagnosis not present

## 2018-10-02 DIAGNOSIS — Z452 Encounter for adjustment and management of vascular access device: Secondary | ICD-10-CM | POA: Diagnosis not present

## 2018-10-03 DIAGNOSIS — Z452 Encounter for adjustment and management of vascular access device: Secondary | ICD-10-CM | POA: Diagnosis not present

## 2018-10-03 DIAGNOSIS — K5792 Diverticulitis of intestine, part unspecified, without perforation or abscess without bleeding: Secondary | ICD-10-CM | POA: Diagnosis not present

## 2018-10-03 DIAGNOSIS — I482 Chronic atrial fibrillation, unspecified: Secondary | ICD-10-CM | POA: Diagnosis not present

## 2018-10-03 DIAGNOSIS — E440 Moderate protein-calorie malnutrition: Secondary | ICD-10-CM | POA: Diagnosis not present

## 2018-10-03 DIAGNOSIS — G8929 Other chronic pain: Secondary | ICD-10-CM | POA: Diagnosis not present

## 2018-10-03 DIAGNOSIS — I10 Essential (primary) hypertension: Secondary | ICD-10-CM | POA: Diagnosis not present

## 2018-10-04 ENCOUNTER — Encounter: Payer: Self-pay | Admitting: Behavioral Health

## 2018-10-04 ENCOUNTER — Telehealth: Payer: Self-pay | Admitting: Behavioral Health

## 2018-10-04 DIAGNOSIS — G8929 Other chronic pain: Secondary | ICD-10-CM | POA: Diagnosis not present

## 2018-10-04 DIAGNOSIS — E440 Moderate protein-calorie malnutrition: Secondary | ICD-10-CM | POA: Diagnosis not present

## 2018-10-04 DIAGNOSIS — Z452 Encounter for adjustment and management of vascular access device: Secondary | ICD-10-CM | POA: Diagnosis not present

## 2018-10-04 DIAGNOSIS — K5792 Diverticulitis of intestine, part unspecified, without perforation or abscess without bleeding: Secondary | ICD-10-CM | POA: Diagnosis not present

## 2018-10-04 DIAGNOSIS — I10 Essential (primary) hypertension: Secondary | ICD-10-CM | POA: Diagnosis not present

## 2018-10-04 DIAGNOSIS — I482 Chronic atrial fibrillation, unspecified: Secondary | ICD-10-CM | POA: Diagnosis not present

## 2018-10-04 NOTE — Telephone Encounter (Signed)
Sharee Pimple the [place] nurse from [place] called stating she went out to see patient today and patient has not been taking prescription for oral Vancomycin.  Informed her Dr. Tommy Medal would be be made aware.  Patient has a hospital f/u with Dr. Tommy Medal 10/11/2018. Pricilla Riffle RN

## 2018-10-07 NOTE — Telephone Encounter (Signed)
error 

## 2018-10-08 ENCOUNTER — Telehealth: Payer: Self-pay

## 2018-10-08 DIAGNOSIS — R5381 Other malaise: Secondary | ICD-10-CM

## 2018-10-08 NOTE — Telephone Encounter (Signed)
Is I am sorry actually this is a difficult sit situation

## 2018-10-08 NOTE — Telephone Encounter (Signed)
Copied from [facility] [phone]. Topic: General - Other >> Oct 08, 2018 12:36 PM Bea Graff, NT wrote: Reason for CRM:  Ria Comment with [facility] calling to check status on receiving back the written order for social work for this pt that began on 12/2.  CB#: [phone] ext [phone]

## 2018-10-08 NOTE — Addendum Note (Signed)
Addended byShawnie Pons on: 10/08/2018 04:58 PM   Modules accepted: Orders

## 2018-10-08 NOTE — Telephone Encounter (Signed)
Left vm for Lindsay to call back.

## 2018-10-08 NOTE — Telephone Encounter (Signed)
Referral placed for social worker. ?

## 2018-10-08 NOTE — Telephone Encounter (Signed)
Attempted to call [name] on the phone.  Her son [name] stated she was not home.  He then stated she was home.  He would not let RN speak with mother.  Per chart staff can speak to son so I attempted to tell him per Dr. Tommy Medal, would like for his mother to start oral Vancomycin. He stated he has the prescription but they were waiting to see if his Mother was going to rehab.  Informed him she can start the medication now and they can continue to give it to her in rehab.  He  stated he is unclear if his mother is coming to appointment with Dr. Tommy Medal 10/11/2018.  [name] states he is aware that the medication has to be filled at a compounding pharmacy.  Called Home health nurse Sharee Pimple informed her Ms. Stodghill is to start oral Vancomycin.  She states she will work on trying to explain that Ms. Charles needs the prescription.  She states she has had some difficulty with the son [name]. Pricilla Riffle RN

## 2018-10-08 NOTE — Telephone Encounter (Signed)
Needs to restart her oral vancomycin

## 2018-10-08 NOTE — Addendum Note (Signed)
Addended byShawnie Pons on: 10/08/2018 03:18 PM   Modules accepted: Orders

## 2018-10-09 DIAGNOSIS — E440 Moderate protein-calorie malnutrition: Secondary | ICD-10-CM | POA: Diagnosis not present

## 2018-10-09 DIAGNOSIS — I10 Essential (primary) hypertension: Secondary | ICD-10-CM | POA: Diagnosis not present

## 2018-10-09 DIAGNOSIS — Z452 Encounter for adjustment and management of vascular access device: Secondary | ICD-10-CM | POA: Diagnosis not present

## 2018-10-09 DIAGNOSIS — K5792 Diverticulitis of intestine, part unspecified, without perforation or abscess without bleeding: Secondary | ICD-10-CM | POA: Diagnosis not present

## 2018-10-09 DIAGNOSIS — G8929 Other chronic pain: Secondary | ICD-10-CM | POA: Diagnosis not present

## 2018-10-09 DIAGNOSIS — I482 Chronic atrial fibrillation, unspecified: Secondary | ICD-10-CM | POA: Diagnosis not present

## 2018-10-09 DIAGNOSIS — A419 Sepsis, unspecified organism: Secondary | ICD-10-CM | POA: Diagnosis not present

## 2018-10-09 LAB — CBC AND DIFFERENTIAL
HCT: 32 — AB (ref 36–46)
Hemoglobin: 10.7 — AB (ref 12.0–16.0)
Neutrophils Absolute: 5
Platelets: 405 — AB (ref 150–399)
WBC: 8.2

## 2018-10-09 LAB — BASIC METABOLIC PANEL
BUN: 11 (ref 4–21)
Creatinine: 0.7 (ref 0.5–1.1)
Glucose: 103
Potassium: 4.3 (ref 3.4–5.3)
Sodium: 137 (ref 137–147)

## 2018-10-09 NOTE — Telephone Encounter (Signed)
Written order received, waiting for MD to sign.  

## 2018-10-11 ENCOUNTER — Telehealth: Payer: Self-pay | Admitting: Nurse Practitioner

## 2018-10-11 ENCOUNTER — Inpatient Hospital Stay: Payer: Self-pay | Admitting: Infectious Disease

## 2018-10-11 NOTE — Telephone Encounter (Signed)
See phone note

## 2018-10-13 ENCOUNTER — Emergency Department (HOSPITAL_COMMUNITY): Payer: Medicare Other

## 2018-10-13 ENCOUNTER — Inpatient Hospital Stay (HOSPITAL_COMMUNITY)
Admission: EM | Admit: 2018-10-13 | Discharge: 2018-10-16 | DRG: 372 | Disposition: A | Discharge status: Other Acute Inpatient Hospital | Payer: Medicare Other | Attending: Internal Medicine | Admitting: Internal Medicine

## 2018-10-13 ENCOUNTER — Encounter (HOSPITAL_COMMUNITY): Payer: Self-pay | Admitting: Emergency Medicine

## 2018-10-13 ENCOUNTER — Other Ambulatory Visit: Payer: Self-pay

## 2018-10-13 DIAGNOSIS — Z6825 Body mass index (BMI) 25.0-25.9, adult: Secondary | ICD-10-CM

## 2018-10-13 DIAGNOSIS — I129 Hypertensive chronic kidney disease with stage 1 through stage 4 chronic kidney disease, or unspecified chronic kidney disease: Secondary | ICD-10-CM | POA: Diagnosis present

## 2018-10-13 DIAGNOSIS — N83202 Unspecified ovarian cyst, left side: Secondary | ICD-10-CM | POA: Diagnosis present

## 2018-10-13 DIAGNOSIS — I4821 Permanent atrial fibrillation: Secondary | ICD-10-CM

## 2018-10-13 DIAGNOSIS — Z79899 Other long term (current) drug therapy: Secondary | ICD-10-CM | POA: Diagnosis not present

## 2018-10-13 DIAGNOSIS — R000 Tachycardia, unspecified: Secondary | ICD-10-CM | POA: Diagnosis not present

## 2018-10-13 DIAGNOSIS — I959 Hypotension, unspecified: Secondary | ICD-10-CM | POA: Diagnosis present

## 2018-10-13 DIAGNOSIS — Z7982 Long term (current) use of aspirin: Secondary | ICD-10-CM

## 2018-10-13 DIAGNOSIS — R262 Difficulty in walking, not elsewhere classified: Secondary | ICD-10-CM | POA: Diagnosis not present

## 2018-10-13 DIAGNOSIS — E440 Moderate protein-calorie malnutrition: Secondary | ICD-10-CM | POA: Diagnosis not present

## 2018-10-13 DIAGNOSIS — Z7401 Bed confinement status: Secondary | ICD-10-CM | POA: Diagnosis not present

## 2018-10-13 DIAGNOSIS — M2550 Pain in unspecified joint: Secondary | ICD-10-CM | POA: Diagnosis not present

## 2018-10-13 DIAGNOSIS — Z8719 Personal history of other diseases of the digestive system: Secondary | ICD-10-CM | POA: Diagnosis not present

## 2018-10-13 DIAGNOSIS — A0472 Enterocolitis due to Clostridium difficile, not specified as recurrent: Secondary | ICD-10-CM | POA: Diagnosis not present

## 2018-10-13 DIAGNOSIS — E876 Hypokalemia: Secondary | ICD-10-CM | POA: Diagnosis not present

## 2018-10-13 DIAGNOSIS — Z888 Allergy status to other drugs, medicaments and biological substances status: Secondary | ICD-10-CM

## 2018-10-13 DIAGNOSIS — A419 Sepsis, unspecified organism: Secondary | ICD-10-CM | POA: Diagnosis not present

## 2018-10-13 DIAGNOSIS — Z792 Long term (current) use of antibiotics: Secondary | ICD-10-CM

## 2018-10-13 DIAGNOSIS — N182 Chronic kidney disease, stage 2 (mild): Secondary | ICD-10-CM | POA: Diagnosis not present

## 2018-10-13 DIAGNOSIS — I4819 Other persistent atrial fibrillation: Secondary | ICD-10-CM | POA: Diagnosis present

## 2018-10-13 DIAGNOSIS — Z886 Allergy status to analgesic agent status: Secondary | ICD-10-CM

## 2018-10-13 DIAGNOSIS — K5792 Diverticulitis of intestine, part unspecified, without perforation or abscess without bleeding: Secondary | ICD-10-CM | POA: Diagnosis not present

## 2018-10-13 DIAGNOSIS — I350 Nonrheumatic aortic (valve) stenosis: Secondary | ICD-10-CM | POA: Diagnosis present

## 2018-10-13 DIAGNOSIS — E46 Unspecified protein-calorie malnutrition: Secondary | ICD-10-CM | POA: Diagnosis present

## 2018-10-13 DIAGNOSIS — Z8619 Personal history of other infectious and parasitic diseases: Secondary | ICD-10-CM | POA: Diagnosis not present

## 2018-10-13 DIAGNOSIS — N132 Hydronephrosis with renal and ureteral calculous obstruction: Secondary | ICD-10-CM | POA: Diagnosis not present

## 2018-10-13 DIAGNOSIS — R5381 Other malaise: Secondary | ICD-10-CM | POA: Diagnosis present

## 2018-10-13 DIAGNOSIS — Z9049 Acquired absence of other specified parts of digestive tract: Secondary | ICD-10-CM

## 2018-10-13 DIAGNOSIS — E039 Hypothyroidism, unspecified: Secondary | ICD-10-CM | POA: Diagnosis present

## 2018-10-13 DIAGNOSIS — I4891 Unspecified atrial fibrillation: Secondary | ICD-10-CM | POA: Diagnosis not present

## 2018-10-13 DIAGNOSIS — T368X6A Underdosing of other systemic antibiotics, initial encounter: Secondary | ICD-10-CM | POA: Diagnosis present

## 2018-10-13 DIAGNOSIS — N1330 Unspecified hydronephrosis: Secondary | ICD-10-CM | POA: Diagnosis present

## 2018-10-13 DIAGNOSIS — R0682 Tachypnea, not elsewhere classified: Secondary | ICD-10-CM | POA: Diagnosis not present

## 2018-10-13 DIAGNOSIS — M069 Rheumatoid arthritis, unspecified: Secondary | ICD-10-CM | POA: Diagnosis present

## 2018-10-13 DIAGNOSIS — I482 Chronic atrial fibrillation, unspecified: Secondary | ICD-10-CM | POA: Diagnosis present

## 2018-10-13 DIAGNOSIS — K5720 Diverticulitis of large intestine with perforation and abscess without bleeding: Secondary | ICD-10-CM | POA: Diagnosis not present

## 2018-10-13 DIAGNOSIS — N838 Other noninflammatory disorders of ovary, fallopian tube and broad ligament: Secondary | ICD-10-CM | POA: Diagnosis present

## 2018-10-13 DIAGNOSIS — Z95828 Presence of other vascular implants and grafts: Secondary | ICD-10-CM | POA: Diagnosis not present

## 2018-10-13 DIAGNOSIS — N183 Chronic kidney disease, stage 3 unspecified: Secondary | ICD-10-CM | POA: Diagnosis present

## 2018-10-13 DIAGNOSIS — E785 Hyperlipidemia, unspecified: Secondary | ICD-10-CM | POA: Diagnosis present

## 2018-10-13 DIAGNOSIS — Z7189 Other specified counseling: Secondary | ICD-10-CM

## 2018-10-13 DIAGNOSIS — I10 Essential (primary) hypertension: Secondary | ICD-10-CM | POA: Diagnosis not present

## 2018-10-13 DIAGNOSIS — A0471 Enterocolitis due to Clostridium difficile, recurrent: Secondary | ICD-10-CM | POA: Diagnosis not present

## 2018-10-13 DIAGNOSIS — R2681 Unsteadiness on feet: Secondary | ICD-10-CM | POA: Diagnosis not present

## 2018-10-13 DIAGNOSIS — Z87891 Personal history of nicotine dependence: Secondary | ICD-10-CM | POA: Diagnosis not present

## 2018-10-13 DIAGNOSIS — E860 Dehydration: Secondary | ICD-10-CM | POA: Diagnosis present

## 2018-10-13 DIAGNOSIS — Z5329 Procedure and treatment not carried out because of patient's decision for other reasons: Secondary | ICD-10-CM | POA: Diagnosis not present

## 2018-10-13 DIAGNOSIS — R41841 Cognitive communication deficit: Secondary | ICD-10-CM | POA: Diagnosis not present

## 2018-10-13 DIAGNOSIS — Z959 Presence of cardiac and vascular implant and graft, unspecified: Secondary | ICD-10-CM

## 2018-10-13 DIAGNOSIS — Z885 Allergy status to narcotic agent status: Secondary | ICD-10-CM

## 2018-10-13 DIAGNOSIS — R0689 Other abnormalities of breathing: Secondary | ICD-10-CM | POA: Diagnosis not present

## 2018-10-13 DIAGNOSIS — Z880 Allergy status to penicillin: Secondary | ICD-10-CM

## 2018-10-13 DIAGNOSIS — R0902 Hypoxemia: Secondary | ICD-10-CM | POA: Diagnosis not present

## 2018-10-13 DIAGNOSIS — M6281 Muscle weakness (generalized): Secondary | ICD-10-CM | POA: Diagnosis not present

## 2018-10-13 LAB — CBC WITH DIFFERENTIAL/PLATELET
Abs Immature Granulocytes: 0.05 10*3/uL (ref 0.00–0.07)
Basophils Absolute: 0.1 10*3/uL (ref 0.0–0.1)
Basophils Relative: 0 %
Eosinophils Absolute: 0.2 10*3/uL (ref 0.0–0.5)
Eosinophils Relative: 1 %
HCT: 35.8 % — ABNORMAL LOW (ref 36.0–46.0)
Hemoglobin: 11 g/dL — ABNORMAL LOW (ref 12.0–15.0)
Immature Granulocytes: 0 %
Lymphocytes Relative: 16 %
Lymphs Abs: 2.5 10*3/uL (ref 0.7–4.0)
MCH: 26.7 pg (ref 26.0–34.0)
MCHC: 30.7 g/dL (ref 30.0–36.0)
MCV: 86.9 fL (ref 80.0–100.0)
Monocytes Absolute: 1 10*3/uL (ref 0.1–1.0)
Monocytes Relative: 7 %
Neutro Abs: 11.8 10*3/uL — ABNORMAL HIGH (ref 1.7–7.7)
Neutrophils Relative %: 76 %
Platelets: 316 10*3/uL (ref 150–400)
RBC: 4.12 MIL/uL (ref 3.87–5.11)
RDW: 16.2 % — ABNORMAL HIGH (ref 11.5–15.5)
WBC: 15.7 10*3/uL — ABNORMAL HIGH (ref 4.0–10.5)
nRBC: 0 % (ref 0.0–0.2)

## 2018-10-13 LAB — I-STAT CG4 LACTIC ACID, ED: Lactic Acid, Venous: 1.6 mmol/L (ref 0.5–1.9)

## 2018-10-13 LAB — COMPREHENSIVE METABOLIC PANEL
ALT: 8 U/L (ref 0–44)
AST: 24 U/L (ref 15–41)
Albumin: 2.5 g/dL — ABNORMAL LOW (ref 3.5–5.0)
Alkaline Phosphatase: 61 U/L (ref 38–126)
Anion gap: 13 (ref 5–15)
BUN: 10 mg/dL (ref 8–23)
CO2: 23 mmol/L (ref 22–32)
Calcium: 8.3 mg/dL — ABNORMAL LOW (ref 8.9–10.3)
Chloride: 98 mmol/L (ref 98–111)
Creatinine, Ser: 0.98 mg/dL (ref 0.44–1.00)
GFR calc Af Amer: 59 mL/min — ABNORMAL LOW (ref >60)
GFR calc non Af Amer: 51 mL/min — ABNORMAL LOW (ref >60)
Glucose, Bld: 113 mg/dL — ABNORMAL HIGH (ref 70–99)
Potassium: 4.2 mmol/L (ref 3.5–5.1)
Sodium: 134 mmol/L — ABNORMAL LOW (ref 135–145)
Total Bilirubin: 0.8 mg/dL (ref 0.3–1.2)
Total Protein: 6.6 g/dL (ref 6.5–8.1)

## 2018-10-13 LAB — PROTIME-INR
INR: 1.17
Prothrombin Time: 14.8 seconds (ref 11.4–15.2)

## 2018-10-13 LAB — URINALYSIS, ROUTINE W REFLEX MICROSCOPIC
Bilirubin Urine: NEGATIVE
Glucose, UA: NEGATIVE mg/dL
Hgb urine dipstick: NEGATIVE
Ketones, ur: NEGATIVE mg/dL
Leukocytes, UA: NEGATIVE
Nitrite: NEGATIVE
Protein, ur: NEGATIVE mg/dL
Specific Gravity, Urine: 1.043 — ABNORMAL HIGH (ref 1.005–1.030)
pH: 5 (ref 5.0–8.0)

## 2018-10-13 LAB — INFLUENZA PANEL BY PCR (TYPE A & B)
Influenza A By PCR: NEGATIVE
Influenza B By PCR: NEGATIVE

## 2018-10-13 MED ORDER — SODIUM CHLORIDE 0.9 % IV BOLUS (SEPSIS)
1000.0000 mL | Freq: Once | INTRAVENOUS | Status: AC
  Administered 2018-10-13: 1000 mL via INTRAVENOUS

## 2018-10-13 MED ORDER — LEVOTHYROXINE SODIUM 75 MCG PO TABS
75.0000 ug | ORAL_TABLET | Freq: Every day | ORAL | Status: DC
  Administered 2018-10-14 – 2018-10-16 (×3): 75 ug via ORAL
  Filled 2018-10-13 (×3): qty 1

## 2018-10-13 MED ORDER — GERHARDT'S BUTT CREAM
TOPICAL_CREAM | Freq: Four times a day (QID) | CUTANEOUS | Status: DC
  Administered 2018-10-13: 19:00:00 via TOPICAL
  Administered 2018-10-13: 1 via TOPICAL
  Administered 2018-10-14 – 2018-10-16 (×9): via TOPICAL
  Filled 2018-10-13: qty 1

## 2018-10-13 MED ORDER — ACETAMINOPHEN 325 MG PO TABS
650.0000 mg | ORAL_TABLET | Freq: Once | ORAL | Status: AC
  Administered 2018-10-13: 650 mg via ORAL
  Filled 2018-10-13: qty 2

## 2018-10-13 MED ORDER — VANCOMYCIN HCL 10 G IV SOLR
1250.0000 mg | Freq: Once | INTRAVENOUS | Status: AC
  Administered 2018-10-13: 1250 mg via INTRAVENOUS
  Filled 2018-10-13: qty 1250

## 2018-10-13 MED ORDER — IOHEXOL 300 MG/ML  SOLN
100.0000 mL | Freq: Once | INTRAMUSCULAR | Status: AC | PRN
  Administered 2018-10-13: 100 mL via INTRAVENOUS

## 2018-10-13 MED ORDER — ALPRAZOLAM 0.25 MG PO TABS: 0.2500 mg | ORAL_TABLET | Freq: Every evening | ORAL | Status: DC | PRN

## 2018-10-13 MED ORDER — SODIUM CHLORIDE 0.9% FLUSH
10.0000 mL | Freq: Two times a day (BID) | INTRAVENOUS | Status: DC
  Administered 2018-10-13: 10 mL

## 2018-10-13 MED ORDER — ONDANSETRON HCL 4 MG PO TABS: 4.0000 mg | ORAL_TABLET | Freq: Four times a day (QID) | ORAL | Status: DC | PRN

## 2018-10-13 MED ORDER — ENSURE ENLIVE PO LIQD
237.0000 mL | Freq: Two times a day (BID) | ORAL | Status: DC
  Administered 2018-10-13 – 2018-10-15 (×4): 237 mL via ORAL

## 2018-10-13 MED ORDER — ADULT MULTIVITAMIN W/MINERALS CH
1.0000 | ORAL_TABLET | Freq: Every day | ORAL | Status: DC
  Administered 2018-10-13 – 2018-10-16 (×4): 1 via ORAL
  Filled 2018-10-13 (×4): qty 1

## 2018-10-13 MED ORDER — ASPIRIN 81 MG PO CHEW
81.0000 mg | CHEWABLE_TABLET | Freq: Every day | ORAL | Status: DC
  Administered 2018-10-13 – 2018-10-16 (×4): 81 mg via ORAL
  Filled 2018-10-13 (×4): qty 1

## 2018-10-13 MED ORDER — METOPROLOL SUCCINATE ER 50 MG PO TB24
75.0000 mg | ORAL_TABLET | Freq: Every day | ORAL | Status: DC
  Administered 2018-10-14 – 2018-10-16 (×3): 75 mg via ORAL
  Filled 2018-10-13 (×3): qty 1

## 2018-10-13 MED ORDER — ERTAPENEM IV (FOR PTA / DISCHARGE USE ONLY): 1.0000 g | INTRAVENOUS | Status: DC

## 2018-10-13 MED ORDER — ONDANSETRON HCL 4 MG/2ML IJ SOLN: 4.0000 mg | Freq: Four times a day (QID) | INTRAMUSCULAR | Status: DC | PRN

## 2018-10-13 MED ORDER — SODIUM CHLORIDE 0.9 % IV SOLN
INTRAVENOUS | Status: DC
  Administered 2018-10-13 – 2018-10-16 (×5): via INTRAVENOUS

## 2018-10-13 MED ORDER — SODIUM CHLORIDE 0.9% FLUSH
10.0000 mL | INTRAVENOUS | Status: DC | PRN
  Administered 2018-10-15 – 2018-10-16 (×2): 20 mL
  Filled 2018-10-13 (×2): qty 40

## 2018-10-13 MED ORDER — SODIUM CHLORIDE 0.9 % IV SOLN
1.0000 g | INTRAVENOUS | Status: AC
  Administered 2018-10-13: 1000 mg via INTRAVENOUS
  Filled 2018-10-13: qty 1

## 2018-10-13 MED ORDER — VANCOMYCIN 50 MG/ML ORAL SOLUTION
125.0000 mg | Freq: Four times a day (QID) | ORAL | Status: DC
  Administered 2018-10-13 – 2018-10-16 (×13): 125 mg via ORAL
  Filled 2018-10-13 (×16): qty 2.5

## 2018-10-13 MED ORDER — GABAPENTIN 100 MG PO CAPS
100.0000 mg | ORAL_CAPSULE | Freq: Every day | ORAL | Status: DC
  Administered 2018-10-13 – 2018-10-15 (×3): 100 mg via ORAL
  Filled 2018-10-13 (×3): qty 1

## 2018-10-13 NOTE — H&P (Addendum)
PCP:   [name], NP   Chief Complaint: weakness and diarrhea   HPI: this is a 84 y/o female with perforated diverticulitis and C diff. They states she sent her home 3 times in the same condition she came in. At discharge she was to have IV Invanz and PO vancomycin PO. For unclear reasons she has had difficulty getting her PO vancomycin filled and has not had any since d/c. Her diarrhea has been worse but is returning slowly. Additionally she states she has gotten much weaker. She states she eats soft foots such as apple sauce, yogurt, apple juice. She believes she has been having fevers. She was sent in by her son because he states he can do no more for her. She uses a walker at baseline and now is so weak she can only go to the bathroom with the assistance of her son.  Her son is not present at bedside. All history provided by patient and chart review.  From discharge summary from 09/26/18: this 84 y.o.femalewith medical history significant ofcomplex diverticulitis with perforation since September of this year who was last admitted on November 8 this year and discharged November 15 with IV Rocephin and Flagyl per ID team to continue home treatment. Patient is not a candidate for surgery and is undergoing treatment with conservative measures. Also has history of aortic stenosis, atrial fibrillation not on anticoagulation, hypothyroidism, hypertension leukocytosis electrolyte abnormalities including hypokalemia and hypomagnesemia. She is on IV antibiotics at home to be taken until December 15.   From d/c summary from 08/02/18: Left ovarian cyst/mass Incidental finding on CT showing 4.9 x 3.4 cm. Patient will need outpatient MRIs of the pelvis to evaluate for ovarian malignancy. OB/GYN follow-up of discharge.  Review of Systems:  The patient denies anorexia, fever, weight loss, weakness. Diarrhea, vision loss, decreased hearing, hoarseness, chest pain, syncope, dyspnea on exertion,  peripheral edema, balance deficits, hemoptysis, abdominal pain, melena, hematochezia, severe indigestion/heartburn, hematuria, incontinence, genital sores, muscle weakness, suspicious skin lesions, transient blindness, difficulty walking, depression, unusual weight change, abnormal bleeding, enlarged lymph nodes, angioedema, and breast masses.  Past Medical History: Past Medical History:  Diagnosis Date  . A-fib ([place])   . Aortic stenosis, moderate    last echo in 2010; AVA 1.1; mild AS per echo in June 2013  . Edema of both legs    s/p laser treatment per Dr. Donnetta Hutching  . Hiatal hernia   . History of chicken pox   . HLD (hyperlipidemia)   . HTN (hypertension)   . Hypothyroidism   . IBS (irritable bowel syndrome)   . Iron deficiency anemia   . Neuropathic pain 09/06/2015  . Rheumatoid arthritis [facility])    Past Surgical History:  Procedure Laterality Date  . APPENDECTOMY  1955  . CARPAL TUNNEL RELEASE Right   . CATARACT EXTRACTION    . ENDOSCOPIC VEIN LASER TREATMENT    . ENDOVENOUS ABLATION SAPHENOUS VEIN W/ LASER  08-29-2012   left greater saphenous vein   Sherren Mocha Early MD  . ENDOVENOUS ABLATION SAPHENOUS VEIN W/ LASER  09-19-2012   right greater saphenous vein by Curt Jews MD  . EYE SURGERY  2005   Bilateral cataract  . FOOT SURGERY  2003   bilateral Hammer Toe  . stab phlebectomy Right 01-02-2013   10-15 incisions right thigh and calf by Curt Jews MD    Medications: Prior to Admission medications   Medication Sig Start Date End Date Taking? Authorizing Provider  acetaminophen (TYLENOL) 500  MG tablet Take 1,000 mg by mouth every 6 (six) hours as needed (joint pain).    Yes [provider]  ALPRAZolam (XANAX) 0.25 MG tablet Take 1 tablet (0.25 mg total) by mouth at bedtime as needed for anxiety. Patient taking differently: Take 0.25 mg by mouth at bedtime.  09/02/18  Yes Nche, Charlene Brooke, NP  aspirin 81 MG chewable tablet Chew 81 mg by mouth daily.   Yes [provider]  ertapenem (INVANZ) IVPB Inject 1 g into the vein daily. Indication:  Recurrent diverticulitis with perforation Last Day of Therapy:  10/13/18 Labs - Once weekly:  CBC/D and BMP, Labs - Every other week:  ESR and CRP 09/26/18 10/26/18 Yes Sheikh, [place], DO  feeding supplement, ENSURE ENLIVE, (ENSURE ENLIVE) LIQD Take 237 mLs by mouth 2 (two) times daily between meals. 09/27/18  Yes Sheikh, Omair Latif, DO  gabapentin (NEURONTIN) 100 MG capsule Take 1 capsule (100 mg total) by mouth at bedtime. 07/03/18  Yes Nche, Charlene Brooke, NP  levothyroxine (SYNTHROID, LEVOTHROID) 75 MCG tablet Take 1 tablet (75 mcg total) by mouth daily before breakfast. 07/03/18  Yes Nche, Charlene Brooke, NP  metoprolol succinate (TOPROL-XL) 25 MG 24 hr tablet Take 3 tablets (75 mg total) by mouth daily. 09/14/18  Yes Eugenie Filler, MD  Multiple Vitamin (MULTIVITAMIN WITH MINERALS) TABS tablet Take 1 tablet by mouth daily. 09/27/18  Yes Sheikh, Omair Latif, DO  Olopatadine HCl 0.2 % SOLN Place 1 drop into both eyes daily.  07/20/18  Yes [provider]  ondansetron (ZOFRAN) 4 MG tablet Take 1 tablet (4 mg total) by mouth every 8 (eight) hours as needed for nausea or vomiting. 09/13/18  Yes Eugenie Filler, MD    Allergies:   Allergies  Allergen Reactions  . Infliximab Other (See Comments)    REACTION: "Enlarged intestines and hernia. Also pushed intestines on lower part of lungs." REACTION: "Enlarged intestines and hernia. Also pushed intestines on lower part of lungs."  . Aspirin Nausea And Vomiting    Low Dose is ok Stomach problems  . Carvedilol Other (See Comments)    Fatigue/ ill Fatigue/ ill  . Codeine Nausea And Vomiting    "Deathly Sick"  . Penicillins Rash    Has patient had a PCN reaction causing immediate rash, facial/tongue/throat swelling, SOB or lightheadedness with hypotension: Y Has patient had a PCN reaction causing severe rash involving mucus membranes or skin  necrosis: Y Has patient had a PCN reaction that required hospitalization: N Has patient had a PCN reaction occurring within the last 10 years: N If all of the above answers are "NO", then may proceed with Cephalosporin use.     Social History:  reports that she quit smoking about 34 years ago. Her smoking use included cigarettes. She has never used smokeless tobacco. She reports that she does not drink alcohol or use drugs.  Family History: Family History  Problem Relation Age of Onset  . Heart disease Mother   . Peripheral vascular disease Mother   . Heart disease Father   . Peripheral vascular disease Father        Right leg amputation  . COPD Father   . Heart disease Brother 65       Heart Disease before age 46  . Heart attack Brother   . Peripheral vascular disease Other   . Hypertension Sister     Physical Exam: Vitals:   10/13/18 0245 10/13/18 0300 10/13/18 0315 10/13/18 0345  BP: 106/88 123/66 137/72 125/79  Pulse: 91 92 89 96  Resp: (!) 23  (!) 26 (!) 25  Temp:      TempSrc:      SpO2: 95% 95% 96% 94%  Weight:      Height:        General:  Alert and oriented times three, cachectic, dry oral mucosa, weak frail appearing female Eyes: PERRLA, pink conjunctiva, no scleral icterus ENT: Moist oral mucosa, neck supple, no thyromegaly Lungs: clear to ascultation, no wheeze, no crackles, no use of accessory muscles Cardiovascular: regular rate and rhythm, no regurgitation, no gallops,  murmur. No carotid bruits, no JVD Abdomen: soft, positive BS, non-tender, non-distended, no organomegaly, not an acute abdomen GU: not examined Neuro: CN II - XII grossly intact, sensation intact Musculoskeletal: strength 5/5 all extremities, no clubbing, cyanosis or edema Skin: no rash, no subcutaneous crepitation, no decubitus,. PICC line right arm Psych: appropriate patient   Labs on Admission:  Recent Labs    10/13/18 0205  NA 134*  K 4.2  CL 98  CO2 23  GLUCOSE 113*  BUN  10  CREATININE 0.98  CALCIUM 8.3*   Recent Labs    10/13/18 0205  AST 24  ALT 8  ALKPHOS 61  BILITOT 0.8  PROT 6.6  ALBUMIN 2.5*   No results for input(s): LIPASE, AMYLASE in the last 72 hours. Recent Labs    10/13/18 0205  WBC 15.7*  NEUTROABS 11.8*  HGB 11.0*  HCT 35.8*  MCV 86.9  PLT 316   No results for input(s): CKTOTAL, CKMB, CKMBINDEX, TROPONINI in the last 72 hours. Invalid input(s): POCBNP No results for input(s): DDIMER in the last 72 hours. No results for input(s): HGBA1C in the last 72 hours. No results for input(s): CHOL, HDL, LDLCALC, TRIG, CHOLHDL, LDLDIRECT in the last 72 hours. No results for input(s): TSH, T4TOTAL, T3FREE, THYROIDAB in the last 72 hours.  Invalid input(s): FREET3 No results for input(s): VITAMINB12, FOLATE, FERRITIN, TIBC, IRON, RETICCTPCT in the last 72 hours.  Micro Results: No results found for this or any previous visit (from the past 240 hour(s)).   Radiological Exams on Admission: Ct Abdomen Pelvis W Contrast  Result Date: 10/13/2018 CLINICAL DATA:  Abdominal pain. Suspect diverticulitis. History of diverticulitis, appendectomy. EXAM: CT ABDOMEN AND PELVIS WITH CONTRAST TECHNIQUE: Multidetector CT imaging of the abdomen and pelvis was performed using the standard protocol following bolus administration of intravenous contrast. CONTRAST:  190m OMNIPAQUE IOHEXOL 300 MG/ML  SOLN COMPARISON:  CT abdomen and pelvis September 22, 2018. FINDINGS: LOWER CHEST: Chronic small pleural effusions. Heart is upper limits of normal. Coronary artery calcifications. No pericardial effusion. HEPATOBILIARY: Asymmetric gallbladder wall thickening versus sludge with mild chronic inflammation, unchanged. No gallbladder distension. Nonacute liver. Small fatty mass LEFT lobe of the liver is unchanged. PANCREAS: Normal. SPLEEN: 2 small cysts versus lymphangioma, otherwise unremarkable. ADRENALS/URINARY TRACT: Kidneys are orthotopic, demonstrating symmetric  enhancement. LEFT upper pole scarring. No nephrolithiasis, hydronephrosis or solid renal masses. Worsening mild LEFT hydroureteronephrosis to level of the pelvis, transition point in pelvis at level of LEFT ovarian mass. Delayed imaging through the kidneys demonstrates symmetric prompt contrast excretion within the proximal urinary collecting system. Urinary bladder is partially distended and unremarkable. Normal adrenal glands. STOMACH/BOWEL: Large hiatal hernia. Severe rectosigmoid inflammatory changes with pericolonic fat stranding. Colonic air-fluid levels. Small and large bowel normal course and caliber. Moderate colonic diverticulosis. Moderate amount of retained large bowel stool. VASCULAR/LYMPHATIC: Aortoiliac vessels are normal in course and  caliber. Moderate calcific atherosclerosis. No lymphadenopathy by CT size criteria. REPRODUCTIVE: Multi cystic complex 4.8 x 5.6 cm LEFT adnexal mass. Persistent inflammatory changes in hyperemic LEFT fallopian tube with minimal gas. OTHER: Contracted pelvic phlegmon, improved. No intraperitoneal free fluid or free air. Presacral fat stranding again noted. MUSCULOSKELETAL: Nonacute.  Stable spondylosis. IMPRESSION: 1. Continued severe rectosigmoid diverticulitis/colitis. Residual pelvic phlegmon, without drainable fluid collection today. 2. Redemonstration of complex cystic LEFT ovarian mass, air tracking along the LEFT fallopian tube concerning for fistula. Recommend non emergent pelvic ultrasound for further characterization of LEFT ovarian mass. 3. Worsening mild LEFT hydroureteronephrosis obstructed at the level of the LEFT ovarian mass. 4. Cholelithiasis and image findings of chronic cholecystitis. Aortic Atherosclerosis (ICD10-I70.0). Electronically Signed   By: Elon Alas M.D.   On: 10/13/2018 03:54   Dg Chest Port 1 View  Result Date: 10/13/2018 CLINICAL DATA:  Generalized weakness, diarrhea today. Recent diverticulitis. EXAM: PORTABLE CHEST 1 VIEW  COMPARISON:  Chest radiograph September 23, 2018 FINDINGS: Cardiac silhouette is mildly enlarged. Mediastinal silhouette is not suspicious, calcified aortic arch. No pleural effusion or focal consolidation. Moderate hiatal hernia. No pneumothorax. RIGHT PICC distal tip projects in distal superior vena cava. Osteopenia. IMPRESSION: 1. Mild cardiomegaly.  No acute pulmonary process. 2. RIGHT PICC distal tip projects in distal superior vena cava. 3.  Aortic Atherosclerosis (ICD10-I70.0). Electronically Signed   By: Elon Alas M.D.   On: 10/13/2018 02:29    Assessment/Plan Present on Admission: . Clostridium difficile colitis -Admit to MedSurg -Resume p.o. vancomycin -C. difficile colitis ordered if patient has recurrent diarrhea however not necessary as it is known that patient's diarrhea is due to C. difficile and it has not been fully treated  . Ovarian mass -Per patient she has had this ovarian mass for decades.  In the 80s she had a biopsy and it was negative.  Patient has been advised that there is a change and the mass is now causing hydronephrosis.  Patient work-up again recommended.  Will order tumor markers.  Discussed in detail with patient that if this is an ovarian cancer, given her current weakness and comorbidities, she is a poor candidate for surgical or chemotherapeutic interventions.  Patient is aware and expresses understanding.  She states that if she gets better she will follow-up with her OB/GYN  . Hydronephrosis -Consult urology in a.m.  . Acute diverticulitis with known perforation -Continue IV Invanz.  Consult surgery in a.m if thought necessary.  When patient was previously admitted, surgery was consulted and signed off stating the patient was not a surgical candidate  Clinical dehydration/protein calorie deficient malnutrition -Gentle IV fluid hydration -Clear liquid diet for now, continue Ensure  Debility -Patient is now amenable to going to nursing home for  rehab.  She understands that rehab is in a nursing home and that this can be temporary depending on her and her son's wishes.  Social services consulted  . Atrial fibrillation, chronic -Stable, home meds resumed including aspirin.  Patient not on anticoagulation given her many comorbidities  . Hypothyroidism -Stable, home meds resumed  . Essential hypertension -Stable, home meds resumed  Billi Bright 10/13/2018, 4:56 AM

## 2018-10-13 NOTE — Progress Notes (Signed)
Patient was admitted this morning with acute diverticulitis with recent perforation secondary to C. difficile colitis.  She has no complaints since admission.  Patient is able to tolerate diet.  She has some diarrhea but only one episode.  She is on oral vancomycin and IV Invanz which has been continued with.  She did not get that after previous discharge.  We will continue with contact precautions as well as treatment.  Hydration as well as supportive care.  She is not a candidate for surgery despite her perforated diverticulitis.  She has had somewhat low blood pressure today but largely controlled and reversed with IV fluids.  PT and OT ordered.  We will continue treatment until patient is back to baseline.

## 2018-10-13 NOTE — Progress Notes (Signed)
MD [facility] notified of pt. B/P; Stated the pt. Was fine as long as she is unproblematic; No orders given at this time. Will continue to monitor.

## 2018-10-13 NOTE — Progress Notes (Signed)
   10/13/18 0827  Vitals  Temp 98.3 F (36.8 C)  Temp Source Oral  BP 92/61  MAP (mmHg) 73  BP Location Right Arm  BP Method Automatic  Patient Position (if appropriate) Lying  Pulse Rate 72  Pulse Rate Source Dinamap  Resp 15  Oxygen Therapy  SpO2 100 %  O2 Device Room Air  MD [facility] text paged

## 2018-10-13 NOTE — Progress Notes (Signed)
Pt has MASD bilateral buttocks and perineal areas noted present on admission.  Dr. Jonelle Sidle notified and agreed to Gerhardts butt cream and mattress replacement.

## 2018-10-13 NOTE — ED Provider Notes (Signed)
[name] Provider Note   CSN: [id number] Arrival date & time: 10/13/18  0146     History   Chief Complaint Chief Complaint  Patient presents with  . Weakness  . Diarrhea    HPI [name] is a 84 y.o. female with history of chronic diverticulitis, C. difficile, hypertension, RA, atrial fibrillation who presents with generalized weakness and fever.  Patient is currently receiving ertapenem through PICC line daily.  She last received it at noon yesterday.  She reports she was supposed to take oral vancomycin, however it never was arranged.  She denies any abdominal pain.  She has persistent diarrhea/loose stools.  She denies any bloody stools at this time.  She denies any urinary symptoms.  She denies any cough or sore throat.  Fever up to 102, per EMS.  HPI  Past Medical History:  Diagnosis Date  . A-fib ([place])   . Aortic stenosis, moderate    last echo in 2010; AVA 1.1; mild AS per echo in June 2013  . Edema of both legs    s/p laser treatment per Dr. Donnetta Hutching  . Hiatal hernia   . History of chicken pox   . HLD (hyperlipidemia)   . HTN (hypertension)   . Hypothyroidism   . IBS (irritable bowel syndrome)   . Iron deficiency anemia   . Neuropathic pain 09/06/2015  . Rheumatoid arthritis [facility])     Patient Active Problem List   Diagnosis Date Noted  . Hydronephrosis 10/13/2018  . Ovarian mass 10/13/2018  . C. difficile colitis 10/13/2018  . Clostridium difficile colitis 09/24/2018  . Hypokalemia   . Hypomagnesemia   . Left ovarian cyst   . Acute diverticulitis 09/07/2018  . Atrial fibrillation with rapid ventricular response ([place]) 09/06/2018  . Leucocytosis 09/06/2018  . A-fib ([place]) 09/06/2018  . Malnutrition of moderate degree 07/26/2018  . Perforation of sigmoid colon due to diverticulitis 07/25/2018  . Thrombocytosis ([place]) 07/25/2018  . Sepsis ([place]) 07/24/2018  . Anxiety about health 07/03/2018  . Generalized  abdominal pain 02/06/2018  . Paresthesia of both lower extremities 02/06/2018  . CKD (chronic kidney disease) stage 2, GFR 60-89 ml/min 01/07/2018  . Normocytic normochromic anemia 01/07/2018  . Hyperglycemia 01/07/2018  . Pyoderma gangrenosum 01/04/2017  . Primary osteoarthritis of both hands 01/04/2017  . Primary osteoarthritis of both feet 01/04/2017  . Primary osteoarthritis of both knees 01/04/2017  . Degenerative joint disease involving multiple joints 01/04/2017  . Neuropathic pain 09/06/2015  . Erythema nodosum 09/21/2014  . Rheumatoid arthritis ([place]) 09/21/2014  . Essential hypertension 09/02/2014  . Rectal bleeding 05/15/2014  . Atrial fibrillation, chronic 05/11/2014  . HLD (hyperlipidemia) 04/02/2014  . Hypothyroidism 04/02/2014  . PAC (premature atrial contraction) 01/23/2014  . Atrial tachycardia, paroxysmal ([place]) 01/23/2014  . Near syncope 01/22/2014  . Varicose veins of lower extremities with other complications 68/05/8109  . Venous insufficiency of both lower extremities 04/19/2012  . Leg edema 01/17/2012  . Aortic stenosis 03/22/2011    Past Surgical History:  Procedure Laterality Date  . APPENDECTOMY  1955  . CARPAL TUNNEL RELEASE Right   . CATARACT EXTRACTION    . ENDOSCOPIC VEIN LASER TREATMENT    . ENDOVENOUS ABLATION SAPHENOUS VEIN W/ LASER  08-29-2012   left greater saphenous vein   Curt Jews MD  . ENDOVENOUS ABLATION SAPHENOUS VEIN W/ LASER  09-19-2012   right greater saphenous vein by Curt Jews MD  . EYE SURGERY  2005   Bilateral  cataract  . FOOT SURGERY  2003   bilateral Hammer Toe  . stab phlebectomy Right 01-02-2013   10-15 incisions right thigh and calf by Curt Jews MD     OB History   No obstetric history on file.      Home Medications    Prior to Admission medications   Medication Sig Start Date End Date Taking? Authorizing Provider  acetaminophen (TYLENOL) 500 MG tablet Take 1,000 mg by mouth every 6 (six) hours as needed  (joint pain).    Yes [provider]  ALPRAZolam (XANAX) 0.25 MG tablet Take 1 tablet (0.25 mg total) by mouth at bedtime as needed for anxiety. Patient taking differently: Take 0.25 mg by mouth at bedtime.  09/02/18  Yes Nche, Charlene Brooke, NP  aspirin 81 MG chewable tablet Chew 81 mg by mouth daily.   Yes [provider]  ertapenem (INVANZ) IVPB Inject 1 g into the vein daily. Indication:  Recurrent diverticulitis with perforation Last Day of Therapy:  10/13/18 Labs - Once weekly:  CBC/D and BMP, Labs - Every other week:  ESR and CRP 09/26/18 10/26/18 Yes Sheikh, [place], DO  feeding supplement, ENSURE ENLIVE, (ENSURE ENLIVE) LIQD Take 237 mLs by mouth 2 (two) times daily between meals. 09/27/18  Yes Sheikh, Omair Latif, DO  gabapentin (NEURONTIN) 100 MG capsule Take 1 capsule (100 mg total) by mouth at bedtime. 07/03/18  Yes Nche, Charlene Brooke, NP  levothyroxine (SYNTHROID, LEVOTHROID) 75 MCG tablet Take 1 tablet (75 mcg total) by mouth daily before breakfast. 07/03/18  Yes Nche, Charlene Brooke, NP  metoprolol succinate (TOPROL-XL) 25 MG 24 hr tablet Take 3 tablets (75 mg total) by mouth daily. 09/14/18  Yes Eugenie Filler, MD  Multiple Vitamin (MULTIVITAMIN WITH MINERALS) TABS tablet Take 1 tablet by mouth daily. 09/27/18  Yes Sheikh, Omair Latif, DO  Olopatadine HCl 0.2 % SOLN Place 1 drop into both eyes daily.  07/20/18  Yes [provider]  ondansetron (ZOFRAN) 4 MG tablet Take 1 tablet (4 mg total) by mouth every 8 (eight) hours as needed for nausea or vomiting. 09/13/18  Yes Eugenie Filler, MD    Family History Family History  Problem Relation Age of Onset  . Heart disease Mother   . Peripheral vascular disease Mother   . Heart disease Father   . Peripheral vascular disease Father        Right leg amputation  . COPD Father   . Heart disease Brother 106       Heart Disease before age 28  . Heart attack Brother   . Peripheral vascular disease Other    . Hypertension Sister     Social History Social History   Tobacco Use  . Smoking status: Former Smoker    Types: Cigarettes    Last attempt to quit: 10/31/1983    Years since quitting: 34.9  . Smokeless tobacco: Never Used  Substance Use Topics  . Alcohol use: No  . Drug use: No     Allergies   Infliximab; Aspirin; Carvedilol; Codeine; and Penicillins   Review of Systems Review of Systems  Constitutional: Positive for fatigue and fever. Negative for chills.  HENT: Negative for facial swelling and sore throat.   Respiratory: Negative for shortness of breath.   Cardiovascular: Negative for chest pain.  Gastrointestinal: Positive for diarrhea. Negative for abdominal pain, nausea and vomiting.  Genitourinary: Negative for dysuria.  Musculoskeletal: Negative for back pain.  Skin: Negative for rash and wound.  Neurological: Positive for weakness (generalized). Negative for headaches.  Psychiatric/Behavioral: The patient is not nervous/anxious.      Physical Exam Updated Vital Signs BP 111/66   Pulse 98   Temp 97.7 F (36.5 C) (Rectal)   Resp (!) 25   Ht 5' (1.524 m)   Wt 59.9 kg   SpO2 95%   BMI 25.78 kg/m   Physical Exam Vitals signs and nursing note reviewed.  Constitutional:      General: She is not in acute distress.    Appearance: She is well-developed. She is not diaphoretic.  HENT:     Head: Normocephalic and atraumatic.     Mouth/Throat:     Pharynx: No oropharyngeal exudate.  Eyes:     General: No scleral icterus.       Right eye: No discharge.        Left eye: No discharge.     Conjunctiva/sclera: Conjunctivae normal.     Pupils: Pupils are equal, round, and reactive to light.  Neck:     Musculoskeletal: Normal range of motion and neck supple.     Thyroid: No thyromegaly.  Cardiovascular:     Rate and Rhythm: Regular rhythm. Tachycardia present.     Heart sounds: Normal heart sounds. No murmur. No friction rub. No gallop.   Pulmonary:      Effort: Pulmonary effort is normal. No respiratory distress.     Breath sounds: Normal breath sounds. No stridor. No wheezing or rales.  Abdominal:     General: Bowel sounds are normal. There is no distension.     Palpations: Abdomen is soft.     Tenderness: There is no abdominal tenderness. There is no guarding or rebound.  Lymphadenopathy:     Cervical: No cervical adenopathy.  Skin:    General: Skin is warm and dry.     Coloration: Skin is not pale.     Findings: No rash.  Neurological:     Mental Status: She is alert.     Coordination: Coordination normal.      ED Treatments / Results  Labs (all labs ordered are listed, but only abnormal results are displayed) Labs Reviewed  COMPREHENSIVE METABOLIC PANEL - Abnormal; Notable for the following components:      Result Value   Sodium 134 (*)    Glucose, Bld 113 (*)    Calcium 8.3 (*)    Albumin 2.5 (*)    GFR calc non Af Amer 51 (*)    GFR calc Af Amer 59 (*)    All other components within normal limits  CBC WITH DIFFERENTIAL/PLATELET - Abnormal; Notable for the following components:   WBC 15.7 (*)    Hemoglobin 11.0 (*)    HCT 35.8 (*)    RDW 16.2 (*)    Neutro Abs 11.8 (*)    All other components within normal limits  CULTURE, BLOOD (ROUTINE X 2)  CULTURE, BLOOD (ROUTINE X 2)  C DIFFICILE QUICK SCREEN W PCR REFLEX  INFLUENZA PANEL BY PCR (TYPE A & B)  PROTIME-INR  URINALYSIS, ROUTINE W REFLEX MICROSCOPIC  I-STAT CG4 LACTIC ACID, ED    EKG None  Radiology Ct Abdomen Pelvis W Contrast  Result Date: 10/13/2018 CLINICAL DATA:  Abdominal pain. Suspect diverticulitis. History of diverticulitis, appendectomy. EXAM: CT ABDOMEN AND PELVIS WITH CONTRAST TECHNIQUE: Multidetector CT imaging of the abdomen and pelvis was performed using the standard protocol following bolus administration of intravenous contrast. CONTRAST:  147m OMNIPAQUE IOHEXOL 300 MG/ML  SOLN COMPARISON:  CT abdomen and pelvis September 22, 2018.  FINDINGS: LOWER CHEST: Chronic small pleural effusions. Heart is upper limits of normal. Coronary artery calcifications. No pericardial effusion. HEPATOBILIARY: Asymmetric gallbladder wall thickening versus sludge with mild chronic inflammation, unchanged. No gallbladder distension. Nonacute liver. Small fatty mass LEFT lobe of the liver is unchanged. PANCREAS: Normal. SPLEEN: 2 small cysts versus lymphangioma, otherwise unremarkable. ADRENALS/URINARY TRACT: Kidneys are orthotopic, demonstrating symmetric enhancement. LEFT upper pole scarring. No nephrolithiasis, hydronephrosis or solid renal masses. Worsening mild LEFT hydroureteronephrosis to level of the pelvis, transition point in pelvis at level of LEFT ovarian mass. Delayed imaging through the kidneys demonstrates symmetric prompt contrast excretion within the proximal urinary collecting system. Urinary bladder is partially distended and unremarkable. Normal adrenal glands. STOMACH/BOWEL: Large hiatal hernia. Severe rectosigmoid inflammatory changes with pericolonic fat stranding. Colonic air-fluid levels. Small and large bowel normal course and caliber. Moderate colonic diverticulosis. Moderate amount of retained large bowel stool. VASCULAR/LYMPHATIC: Aortoiliac vessels are normal in course and caliber. Moderate calcific atherosclerosis. No lymphadenopathy by CT size criteria. REPRODUCTIVE: Multi cystic complex 4.8 x 5.6 cm LEFT adnexal mass. Persistent inflammatory changes in hyperemic LEFT fallopian tube with minimal gas. OTHER: Contracted pelvic phlegmon, improved. No intraperitoneal free fluid or free air. Presacral fat stranding again noted. MUSCULOSKELETAL: Nonacute.  Stable spondylosis. IMPRESSION: 1. Continued severe rectosigmoid diverticulitis/colitis. Residual pelvic phlegmon, without drainable fluid collection today. 2. Redemonstration of complex cystic LEFT ovarian mass, air tracking along the LEFT fallopian tube concerning for fistula. Recommend  non emergent pelvic ultrasound for further characterization of LEFT ovarian mass. 3. Worsening mild LEFT hydroureteronephrosis obstructed at the level of the LEFT ovarian mass. 4. Cholelithiasis and image findings of chronic cholecystitis. Aortic Atherosclerosis (ICD10-I70.0). Electronically Signed   By: Elon Alas M.D.   On: 10/13/2018 03:54   Dg Chest Port 1 View  Result Date: 10/13/2018 CLINICAL DATA:  Generalized weakness, diarrhea today. Recent diverticulitis. EXAM: PORTABLE CHEST 1 VIEW COMPARISON:  Chest radiograph September 23, 2018 FINDINGS: Cardiac silhouette is mildly enlarged. Mediastinal silhouette is not suspicious, calcified aortic arch. No pleural effusion or focal consolidation. Moderate hiatal hernia. No pneumothorax. RIGHT PICC distal tip projects in distal superior vena cava. Osteopenia. IMPRESSION: 1. Mild cardiomegaly.  No acute pulmonary process. 2. RIGHT PICC distal tip projects in distal superior vena cava. 3.  Aortic Atherosclerosis (ICD10-I70.0). Electronically Signed   By: Elon Alas M.D.   On: 10/13/2018 02:29    Procedures Procedures (including critical care time)  CRITICAL CARE Performed by: Frederica Kuster   Total critical care time: 45 minutes  Critical care time was exclusive of separately billable procedures and treating other patients.  Critical care was necessary to treat or prevent imminent or life-threatening deterioration.  Critical care was time spent personally by me on the following activities: development of treatment plan with patient and/or surrogate as well as nursing, discussions with consultants, evaluation of patient's response to treatment, examination of patient, obtaining history from patient or surrogate, ordering and performing treatments and interventions, ordering and review of laboratory studies, ordering and review of radiographic studies, pulse oximetry and re-evaluation of patient's condition.  Sepsis - Repeat  Assessment  Performed at:    0415  Vitals     Blood pressure 111/66, pulse 98, temperature 97.7 F (36.5 C), temperature source Rectal, resp. rate (!) 25, height 5' (1.524 m), weight 59.9 kg, SpO2 95 %.  Heart:     Regular rate, A-fib  Lungs:    CTA  Capillary Refill:   <  2 sec  Peripheral Pulse:   Radial pulse palpable  Skin:     Normal Color     Medications Ordered in ED Medications  vancomycin (VANCOCIN) 50 mg/mL oral solution 125 mg (has no administration in time range)  sodium chloride 0.9 % bolus 1,000 mL (0 mLs Intravenous Stopped 10/13/18 0250)    And  sodium chloride 0.9 % bolus 1,000 mL (0 mLs Intravenous Stopped 10/13/18 0250)  vancomycin (VANCOCIN) 1,250 mg in sodium chloride 0.9 % 250 mL IVPB (1,250 mg Intravenous New Bag/Given 10/13/18 0241)  iohexol (OMNIPAQUE) 300 MG/ML solution 100 mL (100 mLs Intravenous Contrast Given 10/13/18 0319)  acetaminophen (TYLENOL) tablet 650 mg (650 mg Oral Given 10/13/18 0434)     Initial Impression / Assessment and Plan / ED Course  I have reviewed the triage vital signs and the nursing notes.  Pertinent labs & imaging results that were available during my care of the patient were reviewed by me and considered in my medical decision making (see chart for details).     Patient meeting sepsis criteria on arrival.  Code sepsis called.  Vancomycin initiated and plan to continue ertapenem, however considering already received in the past 12 hours, will not repeat at this time.  Fluids initiated which resolved tachycardia.  Lactic negative.  Blood cultures pending.  Leukocytosis of 15.7.  Influenza negative. CT abdomen pelvis shows continued severe rectosigmoid diverticulitis/colitis with residual pelvic phlegmon, without drainable fluid collection; redemonstration of complex cystic left ovarian mass, air tracking along the left fallopian tube concerning for fistula, recommend nonemergent pelvic ultrasound for further characterization of  left ovarian mass; worsening mild left hydroureteronephrosis obstructing the level of the left ovarian mass; cholelithiasis and imaging findings of chronic cholecystitis.  Patient may ultimately need surgical consultation.  Patient previously positive for C. difficile, but was never treated.  I consulted Dr. Claria Dice [facility] who accepts patient for admission and will also initiate the oral antibiotic necessary for C. Difficile.  I appreciate her assistance with the patient.  Patient also evaluated by my attending Dr. Betsey Holiday who guided the patient's management and agrees with plan.  Final Clinical Impressions(s) / ED Diagnoses   Final diagnoses:  Sepsis without acute organ dysfunction, due to unspecified organism [facility])  C. difficile colitis    ED Discharge Orders    None       Frederica Kuster, PA-C 10/13/18 [id number]    Orpah Greek, MD 10/13/18 [phone]

## 2018-10-13 NOTE — Progress Notes (Signed)
Pt. [name] called and pt. Consented to giving Sydney information about pt. Care. Sydney stated he did not want her to be discharged home. He stated he feels rehab or long-term care may be better options due to the patients multiple hospitalizations.

## 2018-10-13 NOTE — Progress Notes (Signed)
Attempted to give report to [facility] RN x1

## 2018-10-13 NOTE — Plan of Care (Signed)
  Problem: Elimination: Goal: Will not experience complications related to bowel motility Outcome: Not Progressing   Problem: Elimination: Goal: Will not experience complications related to bowel motility Outcome: Not Progressing   Problem: Elimination: Goal: Will not experience complications related to bowel motility Outcome: Not Progressing   Problem: Elimination: Goal: Will not experience complications related to bowel motility Outcome: Not Progressing   Problem: Nutrition: Goal: Adequate nutrition will be maintained Outcome: Progressing

## 2018-10-13 NOTE — ED Triage Notes (Signed)
Pt coming from home per EMS. Reports several episodes of diarrhea today. Recently hospitalized for diverticulitis. Been unable to mobile around house for several days or eat due to generalized weakness. A-fib consistent.

## 2018-10-13 NOTE — ED Notes (Signed)
Pt son Breionna Punt [phone]

## 2018-10-14 ENCOUNTER — Encounter: Payer: Self-pay | Admitting: Nurse Practitioner

## 2018-10-14 DIAGNOSIS — I482 Chronic atrial fibrillation, unspecified: Secondary | ICD-10-CM

## 2018-10-14 DIAGNOSIS — N838 Other noninflammatory disorders of ovary, fallopian tube and broad ligament: Secondary | ICD-10-CM

## 2018-10-14 LAB — CALCIUM: Calcium: 8.6

## 2018-10-14 LAB — BASIC METABOLIC PANEL
Anion gap: 9 (ref 5–15)
BUN: 10 mg/dL (ref 8–23)
CO2: 24 mmol/L (ref 22–32)
Calcium: 7.6 mg/dL — ABNORMAL LOW (ref 8.9–10.3)
Chloride: 103 mmol/L (ref 98–111)
Creatinine, Ser: 0.75 mg/dL (ref 0.44–1.00)
GFR calc Af Amer: >60 mL/min (ref >60)
GFR calc non Af Amer: >60 mL/min (ref >60)
Glucose, Bld: 99 mg/dL (ref 70–99)
Potassium: 3.1 mmol/L — ABNORMAL LOW (ref 3.5–5.1)
Sodium: 136 mmol/L (ref 135–145)

## 2018-10-14 LAB — CBC
HCT: 29.6 % — ABNORMAL LOW (ref 36.0–46.0)
Hemoglobin: 9.1 g/dL — ABNORMAL LOW (ref 12.0–15.0)
MCH: 26.3 pg (ref 26.0–34.0)
MCHC: 30.7 g/dL (ref 30.0–36.0)
MCV: 85.5 fL (ref 80.0–100.0)
Platelets: 273 10*3/uL (ref 150–400)
RBC: 3.46 MIL/uL — ABNORMAL LOW (ref 3.87–5.11)
RDW: 16.1 % — ABNORMAL HIGH (ref 11.5–15.5)
WBC: 11.6 10*3/uL — ABNORMAL HIGH (ref 4.0–10.5)
nRBC: 0 % (ref 0.0–0.2)

## 2018-10-14 LAB — CA 125: Cancer Antigen (CA) 125: 24.5 U/mL (ref 0.0–38.1)

## 2018-10-14 LAB — SEDIMENTATION RATE: Sedimentation Rate-Westergren: 32

## 2018-10-14 LAB — C REACTIVE PROTEIN, FLUID: CRP: 70

## 2018-10-14 LAB — LACTIC ACID, PLASMA: Lactic Acid, Venous: 0.8 mmol/L (ref 0.5–1.9)

## 2018-10-14 MED ORDER — SODIUM CHLORIDE 0.9 % IV SOLN
1.0000 g | INTRAVENOUS | Status: DC
  Administered 2018-10-14 – 2018-10-15 (×2): 1000 mg via INTRAVENOUS
  Filled 2018-10-14 (×3): qty 1

## 2018-10-14 MED ORDER — ACETAMINOPHEN 325 MG PO TABS
650.0000 mg | ORAL_TABLET | Freq: Four times a day (QID) | ORAL | Status: DC | PRN
  Administered 2018-10-14: 650 mg via ORAL
  Filled 2018-10-14: qty 2

## 2018-10-14 MED ORDER — METOPROLOL TARTRATE 5 MG/5ML IV SOLN
5.0000 mg | INTRAVENOUS | Status: AC
  Administered 2018-10-14: 5 mg via INTRAVENOUS
  Filled 2018-10-14: qty 5

## 2018-10-14 MED ORDER — METOPROLOL TARTRATE 5 MG/5ML IV SOLN: 5.0000 mg | Freq: Four times a day (QID) | INTRAVENOUS | Status: DC | PRN

## 2018-10-14 NOTE — Progress Notes (Addendum)
PROGRESS NOTE  [name] YBO:[id number] DOB: 06-09-1927 DOA: 10/13/2018 PCP: [name], NP  HPI/Recap of past 66 hours: 84 year old female with past medical history of current perforated diverticulitis and C. difficile who has had previous hospitalizations for this and was recently discharged approximately 2 weeks ago with plans to be on IV Invanz and p.o. vancomycin.  Unfortunately, she states that she was not able to get her p.o. vancomycin filled and during that time her diarrhea had gotten worse.  She is not felt to be a surgical candidate given history of aortic stenosis and other medical issues.  In addition, she was found to have a left ovarian mass 2 months ago with plans for outpatient OB/GYN follow-up which she did not yet get.  Patient readmitted on early morning of 12/15 with recurrent C. difficile, acute diverticulitis and worsening left hydroureter nephrosis felt to be secondary to this ovarian mass.  Patient started on IV Invanz and p.o. vancomycin and admitted to the hospitalist service.  Today, she is complaining of some mild abdominal cramping.  Nursing reports some issues with urinary retention, resolved with in and out catheterization.  When she stands up, recurrent episodes of rapid atrial fibrillation.  Assessment/Plan: Active Problems:   HLD (hyperlipidemia)   Hypothyroidism: Continue Synthroid   Atrial fibrillation, chronic: Initially tachycardic when she gets up and ambulates.  Gave a dose of IV metoprolol.  Moving to cardiac telemetry.  Chads 2 score of 3.  Not on anticoagulation.    Essential hypertension: PRN hydralazine.    CKD (chronic kidney disease) stage 2, GFR 60-89 ml/min    Acute diverticulitis: Restarted on IV Invanz.  Had fever spike today, but it appeared that Invanz had not been restarted after initial dose in the emergency room.  Have since ordered this.    Clostridium difficile colitis: Recurrent.  Patient started back up on p.o.  vancomycin.  Before she is discharged, will need to ensure that she physically has p.o. vancomycin to go home with.  White count trending downward.    Hydronephrosis: Renal function appears to be stable.  Will consult urology in the morning.    Ovarian mass: Ovarian markers unremarkable.  We will go ahead and plan to get MRI while in hospital.  Patient states she has had this ovarian mass for decades in the 80s, she had a biopsy which was negative.  There is now since been a change in other masses causing hydro-nephrosis.  Tumor marker unremarkable.  Recommendation is for pelvic ultrasound, will hold off until patient's infection and diverticulitis more stable.  Code Status: Full code  Family Communication: Left message for son  Disposition Plan: Continue in hospital until infection better stabilized, ovarian mass clarified   Consultants:  Notified infectious disease of using [place] [facility] in the morning.  Procedures:  None  Antimicrobials:  IV Invanz 12/15-present  P.o. vancomycin 12/15-present  DVT prophylaxis: SCDs   Objective: Vitals:   10/14/18 1500 10/14/18 1839  BP: (!) 147/80 122/86  Pulse: (!) 105 89  Resp:    Temp: (!) 102.8 F (39.3 C)   SpO2: 97%     Intake/Output Summary (Last 24 hours) at 10/14/2018 2013 Last data filed at 10/14/2018 1853 Gross per 24 hour  Intake 1639.78 ml  Output 400 ml  Net 1239.78 ml   Filed Weights   10/13/18 0156  Weight: 59.9 kg   Body mass index is 25.78 kg/m.  Exam:   General: Alert and oriented x2, no  acute distress  HEENT: Normocephalic and atraumatic, mucous membranes slightly dry  Neck: Supple, no JVD  Cardiovascular: Irregular rhythm, rate controlled  Respiratory: Clear to auscultation bilaterally  Abdomen: Soft, mild distention, tender especially the lower quadrant, hypoactive bowel sounds  Musculoskeletal: No clubbing or cyanosis, trace pitting edema  Skin: No skin breaks, tears or  lesions  Psychiatry: Slightly flattened affect, otherwise appropriate, no evidence of psychoses  Neuro: No focal deficits   Data Reviewed: CBC: Recent Labs  Lab 10/09/18 10/13/18 0205 10/14/18 0334  WBC 8.2 15.7* 11.6*  NEUTROABS 5 11.8*  --   HGB 10.7* 11.0* 9.1*  HCT 32* 35.8* 29.6*  MCV  --  86.9 85.5  PLT 405* 316 326   Basic Metabolic Panel: Recent Labs  Lab 10/09/18 10/13/18 0205 10/14/18 0334  NA 137 134* 136  K 4.3 4.2 3.1*  CL  --  98 103  CO2  --  23 24  GLUCOSE  --  113* 99  BUN 11 10 10   CREATININE 0.7 0.98 0.75  CALCIUM 8.6 8.3* 7.6*   GFR: Estimated Creatinine Clearance: 38.6 mL/min (by C-G formula based on SCr of 0.75 mg/dL). Liver Function Tests: Recent Labs  Lab 10/13/18 0205  AST 24  ALT 8  ALKPHOS 61  BILITOT 0.8  PROT 6.6  ALBUMIN 2.5*   No results for input(s): LIPASE, AMYLASE in the last 168 hours. No results for input(s): AMMONIA in the last 168 hours. Coagulation Profile: Recent Labs  Lab 10/13/18 0205  INR 1.17   Cardiac Enzymes: No results for input(s): CKTOTAL, CKMB, CKMBINDEX, TROPONINI in the last 168 hours. BNP (last 3 results) No results for input(s): PROBNP in the last 8760 hours. HbA1C: No results for input(s): HGBA1C in the last 72 hours. CBG: No results for input(s): GLUCAP in the last 168 hours. Lipid Profile: No results for input(s): CHOL, HDL, LDLCALC, TRIG, CHOLHDL, LDLDIRECT in the last 72 hours. Thyroid Function Tests: No results for input(s): TSH, T4TOTAL, FREET4, T3FREE, THYROIDAB in the last 72 hours. Anemia Panel: No results for input(s): VITAMINB12, FOLATE, FERRITIN, TIBC, IRON, RETICCTPCT in the last 72 hours. Urine analysis:    Component Value Date/Time   COLORURINE YELLOW 10/13/2018 1504   APPEARANCEUR CLEAR 10/13/2018 1504   LABSPEC 1.043 (H) 10/13/2018 1504   PHURINE 5.0 10/13/2018 1504   GLUCOSEU NEGATIVE 10/13/2018 1504   HGBUR NEGATIVE 10/13/2018 1504   BILIRUBINUR NEGATIVE 10/13/2018  1504   KETONESUR NEGATIVE 10/13/2018 1504   PROTEINUR NEGATIVE 10/13/2018 1504   UROBILINOGEN 0.2 07/03/2014 0313   NITRITE NEGATIVE 10/13/2018 1504   LEUKOCYTESUR NEGATIVE 10/13/2018 1504   Sepsis Labs: @LABRCNTIP (procalcitonin:4,lacticidven:4)  ) Recent Results (from the past 240 hour(s))  Blood Culture (routine x 2)     Status: None (Preliminary result)   Collection Time: 10/13/18  2:10 AM  Result Value Ref Range Status   Specimen Description BLOOD LEFT ANTECUBITAL  Final   Special Requests   Final    BOTTLES DRAWN AEROBIC AND ANAEROBIC Blood Culture results may not be optimal due to an inadequate volume of blood received in culture bottles   Culture   Final    NO GROWTH 1 DAY Performed at [facility], [place] [address] [place], [place] [address]    Report Status PENDING  Incomplete  Blood Culture (routine x 2)     Status: None (Preliminary result)   Collection Time: 10/13/18  2:28 AM  Result Value Ref Range Status   Specimen Description BLOOD LEFT HAND  Final   Special Requests   Final    BOTTLES DRAWN AEROBIC AND ANAEROBIC Blood Culture results may not be optimal due to an inadequate volume of blood received in culture bottles   Culture   Final    NO GROWTH 1 DAY Performed at [facility] [address], [place], [place] [address]    Report Status PENDING  Incomplete      Studies: No results found.  Scheduled Meds: . aspirin  81 mg Oral Daily  . feeding supplement (ENSURE ENLIVE)  237 mL Oral BID BM  . gabapentin  100 mg Oral QHS  . Gerhardt's butt cream   Topical QID  . levothyroxine  75 mcg Oral QAC breakfast  . metoprolol succinate  75 mg Oral Daily  . multivitamin with minerals  1 tablet Oral Daily  . sodium chloride flush  10-40 mL Intracatheter Q12H  . vancomycin  125 mg Oral QID    Continuous Infusions: . sodium chloride 75 mL/hr at 10/14/18 0823  . ertapenem 200 mL/hr at 10/14/18 1853     LOS: 1 day     Annita Brod, MD [facility]  To reach me or the doctor on call, go to: [email or web address] Password [facility]  10/14/2018, 8:13 PM

## 2018-10-14 NOTE — Evaluation (Signed)
Physical Therapy Evaluation Patient Details Name: [name] MRN: [id number] DOB: 1927-01-21 Today's Date: 10/14/2018   History of Present Illness  Pt is an 84 y.o. female admitted 10/13/18 with worsening weakness and diarrhea; worked up for perforated diverticulitis and C diff. Of note, multiple recent admissions with similar symptoms. PMH includes HTN, IBS, RA, hiatal hernia, a-fib.    Clinical Impression  Pt presents with an overall decrease in functional mobility secondary to above. PTA, pt ambulatory with rollator and lives with son; pt was mod indep, but required increased assist from son during days leading to admission secondary to worsening weakness. Today, pt requires min guard to minA for mobility with RW; session limited secondary to HR up to 160 with short ambulation distance. Pt also with bowel incontinence, requiring assist for lower body bathing/dressing. Discussed recommendaiton for SNF-level therapies to maximize functional mobility and independence prior to return home; pt in agreement with this and motivated to regain strength. Will follow acutely to address established goals.      Follow Up Recommendations SNF;Supervision for mobility/OOB    Equipment Recommendations  None recommended by PT    Recommendations for Other Services       Precautions / Restrictions Precautions Precautions: Fall;Other (comment) Precaution Comments: Bowel incontinence (c diff) Restrictions Weight Bearing Restrictions: No      Mobility  Bed Mobility Overal bed mobility: Needs Assistance Bed Mobility: Supine to Sit     Supine to sit: Supervision     General bed mobility comments: Bowel incontinence upon sitting; cues to scoot towards EOB prior to standing, no physical assist required  Transfers Overall transfer level: Needs assistance Equipment used: Rolling walker (2 wheeled) Transfers: Sit to/from Stand Sit to Stand: Min assist         General transfer comment:  MinA to stand from bed and toilet; pt reliant on UE support. Bowel incontinence upon standing from bed  Ambulation/Gait Ambulation/Gait assistance: Min guard Gait Distance (Feet): 40 Feet Assistive device: Rolling walker (2 wheeled)   Gait velocity: Decreased Gait velocity interpretation: <1.8 ft/sec, indicate of risk for recurrent falls General Gait Details: Amb in room to/from bathroom with RW and close min guard for balance. Deferred further distance secondary to HR up to 160  Stairs            Wheelchair Mobility    Modified Rankin (Stroke Patients Only)       Balance Overall balance assessment: Needs assistance Sitting-balance support: Feet supported Sitting balance-Leahy Scale: Fair Sitting balance - Comments: Required assist for LB bathing and dressing while seated   Standing balance support: Bilateral upper extremity supported Standing balance-Leahy Scale: Fair Standing balance comment: Can static stand without UE support and close min guard; trunk lean on sink when washing hands                             Pertinent Vitals/Pain Pain Assessment: No/denies pain    Home Living Family/patient expects to be discharged to:: Private residence Living Arrangements: Children Available Help at Discharge: Family;Available 24 hours/day Type of Home: House Home Access: Level entry;Ramped entrance     Home Layout: One level Home Equipment: Walker - 4 wheels;Shower seat - built in      Prior Function Level of Independence: Independent with assistive device(s);Needs assistance   Gait / Transfers Assistance Needed: Ambulatory with rollator; days leading to admission, son having to assist pt to bathroom secondary to weakness  Hand Dominance        Extremity/Trunk Assessment   Upper Extremity Assessment Upper Extremity Assessment: Generalized weakness    Lower Extremity Assessment Lower Extremity Assessment: Generalized weakness     Cervical / Trunk Assessment Cervical / Trunk Assessment: Kyphotic  Communication   Communication: No difficulties  Cognition Arousal/Alertness: Awake/alert Behavior During Therapy: WFL for tasks assessed/performed Overall Cognitive Status: Within Functional Limits for tasks assessed                                        General Comments General comments (skin integrity, edema, etc.): HR 120-160 with short ambulation and activity    Exercises     Assessment/Plan    PT Assessment Patient needs continued PT services  PT Problem List Decreased strength;Decreased activity tolerance;Decreased mobility;Decreased balance;Decreased knowledge of precautions       PT Treatment Interventions Gait training;Functional mobility training;Therapeutic activities;Therapeutic exercise;Balance training;DME instruction;Patient/family education    PT Goals (Current goals can be found in the Care Plan section)  Acute Rehab PT Goals Patient Stated Goal: Agreeable to SNF in order to regain strength so son does not have to help her so much at home PT Goal Formulation: With patient Time For Goal Achievement: 10/28/18 Potential to Achieve Goals: Good    Frequency Min 2X/week   Barriers to discharge        Co-evaluation               AM-PAC PT "6 Clicks" Mobility  Outcome Measure Help needed turning from your back to your side while in a flat bed without using bedrails?: A Little Help needed moving from lying on your back to sitting on the side of a flat bed without using bedrails?: A Little Help needed moving to and from a bed to a chair (including a wheelchair)?: A Little Help needed standing up from a chair using your arms (e.g., wheelchair or bedside chair)?: A Little Help needed to walk in hospital room?: A Little Help needed climbing 3-5 steps with a railing? : A Lot 6 Click Score: 17    End of Session Equipment Utilized During Treatment: Gait belt Activity  Tolerance: Patient tolerated treatment well;Treatment limited secondary to medical complications (Comment)(tachycardia) Patient left: in chair;with call bell/phone within reach;with chair alarm set Nurse Communication: Mobility status PT Visit Diagnosis: Other abnormalities of gait and mobility (R26.89);Unsteadiness on feet (R26.81)    Time: 1914-7829 PT Time Calculation (min) (ACUTE ONLY): 35 min   Charges:   PT Evaluation $PT Eval Moderate Complexity: 1 Mod PT Treatments $Therapeutic Activity: 8-22 mins      Mabeline Caras, PT, DPT Acute Rehabilitation Services  Pager [phone] Office [place] 10/14/2018, 5:15 PM

## 2018-10-14 NOTE — Clinical Social Work Note (Signed)
Clinical Social Work Assessment  Patient Details  Name: [name] MRN: [id number] Date of Birth: [birth year]  Date of referral:  10/14/18               Reason for consult:  [employer], Discharge Planning                Permission sought to share information with:  Facility Sport and exercise psychologist, Family Supports Permission granted to share information::  Yes, Verbal Permission Granted  Name::     [name]  Agency::  SNFs  Relationship::  son  Contact Information:  [phone]  Housing/Transportation Living arrangements for the past 2 months:   single family home Source of Information:  Patient Patient Interpreter Needed:  None Criminal Activity/Legal Involvement Pertinent to Current Situation/Hospitalization:  No - Comment as needed Significant Relationships:  Adult Children, Siblings Lives with:  Adult Children Do you feel safe going back to the place where you live?  Yes Need for family participation in patient care:  Yes (Comment)  Care giving concerns: Pt's two sons have been repeatedly calling asking for placement for pt. Pt from home with son [name]. Pt states she is "tired and weak."    Facilities manager / plan:  CSW met with pt at bedside. Introduced self, role, and reason for visit. Pt states that she lives at home with her son [name]. She has another son [name] but after much of the assessment she mentions that her sons do not get along with each other. She requests CSW contact [name] because she lives with him. We discussed what kind of support that she has at home. I made pt aware that pt sons had been calling asking for placement. Pt states she still feels unwell and that she would be interested in rehab but does not elaborate on any of her answers with CSW about where she would like to be referred etc. Pt expresses she is experiencing cramping from medication and diaharrea. CSW will revisit pt once evals are made with offers.   CSW will  contact pt son to explain placement. Pt is A&Ox4.   Employment status:  Retired Forensic scientist:  Medicare PT Recommendations:  Not assessed at this time [place] / Referral to community resources:  [place]  Patient/Family's Response to care: pt amenable to speaking with CSW, permission granted to talk with both sons although she requested main contact be [name] since he is the main individual helping her out.   Patient/Family's Understanding of and Emotional Response to Diagnosis, Current Treatment, and Prognosis:  Pt states understanding of her diagnosis, current treatment and prognosis. She looks visibly uncomfortable on interview with CSW. Pt states interest in rehab, but hard to gauge emotional response due to discomfort.   Emotional Assessment Appearance:  Appears stated age Attitude/Demeanor/Rapport:  Other, Gracious(Quiet) Affect (typically observed):  Quiet, Flat Orientation:  Oriented to Self, Oriented to Place, Oriented to  Time, Oriented to Situation Alcohol / Substance use:  Not Applicable Psych involvement (Current and /or in the community):  No (Comment)  Discharge Needs  Concerns to be addressed:  Care Coordination, Discharge Planning Concerns Readmission within the last 30 days:  Yes Current discharge risk:  Chronically ill, Physical Impairment Barriers to Discharge:  Ship broker, Continued Medical Work up   [employer], [place] 10/14/2018, 12:46 PM

## 2018-10-14 NOTE — Telephone Encounter (Signed)
Left vm for the pt to call back.  

## 2018-10-14 NOTE — NC FL2 (Signed)
[place] LEVEL OF CARE SCREENING TOOL     IDENTIFICATION  Patient Name: [name] Birthdate: 1927/08/15 Sex: female Admission Date (Current Location): 10/13/2018  [facility] and [place] Number:  Herbalist and Address:  [facility], [place] [address] [place], [place] [address]      Provider Number: [id number]  Attending Physician Name and Address:  Annita Brod, MD  Relative Name and Phone Number:  Carla Rashad, son, [phone]    Current Level of Care: Hospital Recommended Level of Care: [place] Prior Approval Number:    Date Approved/Denied:   PASRR Number: [id number]  Discharge Plan: SNF    Current Diagnoses: Patient Active Problem List   Diagnosis Date Noted  . Hydronephrosis 10/13/2018  . Ovarian mass 10/13/2018  . C. difficile colitis 10/13/2018  . Clostridium difficile colitis 09/24/2018  . Hypokalemia   . Hypomagnesemia   . Left ovarian cyst   . Acute diverticulitis 09/07/2018  . Atrial fibrillation with rapid ventricular response ([place]) 09/06/2018  . Leucocytosis 09/06/2018  . A-fib ([place]) 09/06/2018  . Malnutrition of moderate degree 07/26/2018  . Perforation of sigmoid colon due to diverticulitis 07/25/2018  . Thrombocytosis ([place]) 07/25/2018  . Sepsis ([place]) 07/24/2018  . Anxiety about health 07/03/2018  . Generalized abdominal pain 02/06/2018  . Paresthesia of both lower extremities 02/06/2018  . CKD (chronic kidney disease) stage 2, GFR 60-89 ml/min 01/07/2018  . Normocytic normochromic anemia 01/07/2018  . Hyperglycemia 01/07/2018  . Pyoderma gangrenosum 01/04/2017  . Primary osteoarthritis of both hands 01/04/2017  . Primary osteoarthritis of both feet 01/04/2017  . Primary osteoarthritis of both knees 01/04/2017  . Degenerative joint disease involving multiple joints 01/04/2017  . Neuropathic pain 09/06/2015  . Erythema nodosum 09/21/2014  . Rheumatoid arthritis  ([place]) 09/21/2014  . Essential hypertension 09/02/2014  . Rectal bleeding 05/15/2014  . Atrial fibrillation, chronic 05/11/2014  . HLD (hyperlipidemia) 04/02/2014  . Hypothyroidism 04/02/2014  . PAC (premature atrial contraction) 01/23/2014  . Atrial tachycardia, paroxysmal ([place]) 01/23/2014  . Near syncope 01/22/2014  . Varicose veins of lower extremities with other complications 63/87/5643  . Venous insufficiency of both lower extremities 04/19/2012  . Leg edema 01/17/2012  . Aortic stenosis 03/22/2011    Orientation RESPIRATION BLADDER Height & Weight     Self, Time, Situation, Place  Normal Continent Weight: 132 lb (59.9 kg) Height:  5' (152.4 cm)  BEHAVIORAL SYMPTOMS/MOOD NEUROLOGICAL BOWEL NUTRITION STATUS      Continent Diet(see discharge summary)  AMBULATORY STATUS COMMUNICATION OF NEEDS Skin   Limited Assist Verbally Normal                       Personal Care Assistance Level of Assistance  Bathing, Dressing, Feeding Bathing Assistance: Limited assistance Feeding assistance: Independent Dressing Assistance: Limited assistance     Functional Limitations Info  Sight, Hearing, Speech Sight Info: Adequate Hearing Info: Adequate Speech Info: Adequate    SPECIAL CARE FACTORS FREQUENCY  PT (By licensed PT), OT (By licensed OT)     PT Frequency: 5x week OT Frequency: 5x week            Contractures Contractures Info: Not present    Additional Factors Info  Code Status, Allergies, Isolation Precautions Code Status Info: Full Code Allergies Info: INFLIXIMAB, ASPIRIN, CARVEDILOL, CODEINE, PENICILLINS      Isolation Precautions Info: enteric precautions     Current Medications (10/14/2018):  This is the current  hospital active medication list Current Facility-Administered Medications  Medication Dose Route Frequency Provider Last Rate Last Dose  . 0.9 %  sodium chloride infusion   Intravenous Continuous Quintella Baton, MD 75 mL/hr at 10/14/18 0823    .  acetaminophen (TYLENOL) tablet 650 mg  650 mg Oral Q6H PRN Annita Brod, MD   650 mg at 10/14/18 1638  . ALPRAZolam (XANAX) tablet 0.25 mg  0.25 mg Oral QHS PRN Crosley, Debby, MD      . aspirin chewable tablet 81 mg  81 mg Oral Daily Crosley, Debby, MD   81 mg at 10/14/18 0824  . ertapenem (INVANZ) 1,000 mg in sodium chloride 0.9 % 100 mL IVPB  1 g Intravenous Q24H Annita Brod, MD      . feeding supplement (ENSURE ENLIVE) (ENSURE ENLIVE) liquid 237 mL  237 mL Oral BID BM Crosley, Debby, MD   237 mL at 10/14/18 0825  . gabapentin (NEURONTIN) capsule 100 mg  100 mg Oral QHS Crosley, Debby, MD   100 mg at 10/13/18 2238  . Gerhardt's butt cream   Topical QID Gala Romney L, MD      . levothyroxine (SYNTHROID, LEVOTHROID) tablet 75 mcg  75 mcg Oral QAC breakfast Quintella Baton, MD   75 mcg at 10/14/18 0524  . metoprolol succinate (TOPROL-XL) 24 hr tablet 75 mg  75 mg Oral Daily Crosley, Debby, MD   75 mg at 10/14/18 0828  . metoprolol tartrate (LOPRESSOR) injection 5 mg  5 mg Intravenous NOW Annita Brod, MD      . multivitamin with minerals tablet 1 tablet  1 tablet Oral Daily Quintella Baton, MD   1 tablet at 10/14/18 0824  . ondansetron (ZOFRAN) tablet 4 mg  4 mg Oral Q6H PRN Crosley, Debby, MD       Or  . ondansetron (ZOFRAN) injection 4 mg  4 mg Intravenous Q6H PRN Crosley, Debby, MD      . sodium chloride flush (NS) 0.9 % injection 10-40 mL  10-40 mL Intracatheter Q12H Gala Romney L, MD   10 mL at 10/13/18 2239  . sodium chloride flush (NS) 0.9 % injection 10-40 mL  10-40 mL Intracatheter PRN Elwyn Reach, MD      . vancomycin (VANCOCIN) 50 mg/mL oral solution 125 mg  125 mg Oral QID Quintella Baton, MD   125 mg at 10/14/18 1432     Discharge Medications: Please see discharge summary for a list of discharge medications.  Relevant Imaging Results:  Relevant Lab Results:   Additional Information [id number] [place], [place]

## 2018-10-14 NOTE — Social Work (Signed)
CSW acknowledging consult for SNF placement. CSW requesting PT/OT evals for pt to determine level of function and make recommendations.   Westley Hummer, MSW, Dent Work [phone]

## 2018-10-14 NOTE — Telephone Encounter (Signed)
Patient's son, [name] called to request his mother's lab results; RN verified that he's listed on the DPR. [name] was made aware of the labs & provider's recommendations listed below.  Per Sydney Lacy, NP: Stable CBC and BMP.  Normal sed rate abut elevated CRP. This is due to diverticulitis.  Complete abx as prescribed.  Please inquire with home heal about magnesium and phosphorus results?  She also needs to f/up with infectious disase as directly after hospital discharge.   He voiced understanding & reported that his mother was readmitted to the hospital and will discharge to a skilled nursing facility.

## 2018-10-14 NOTE — Progress Notes (Signed)
[facility]  [name] is an active pt with [facility] prior to this admission for [facility] for home IVABX.  [facility] will need to work with Hospital team to support a DC disposition that best meets the patients level of care needed.  If patient discharges after hours, please call [phone].   [name] 10/14/2018, 5:10 PM

## 2018-10-14 NOTE — Progress Notes (Signed)
Pt's son called this evening to check on the status of the Pt. With the permission of the Pt, I advised that we were waiting for orders to move the Pt to step down - Pt had told her son that she would be moving to Cardiac floor due to her HR.  Advised, that we did not not have orders at this time. Son inquired about SNF, advised that CSW was working on this detail, and that he could call and talk to her in the AM, Pt did not have orders at this time, but I am sure they were working on insurance approval and then would look for placement

## 2018-10-14 NOTE — Progress Notes (Signed)
   10/14/18 1500  Vitals  Temp (!) 102.8 F (39.3 C)  Temp Source Oral  BP (!) 147/80  BP Location Left Arm  BP Method Automatic  Patient Position (if appropriate) Lying  Pulse Rate (!) 105  Pulse Rate Source Dinamap  Oxygen Therapy  SpO2 97 %  O2 Device Room Air  MD [place] Pink notified

## 2018-10-14 NOTE — Progress Notes (Signed)
Abstracted result and sent to scan  

## 2018-10-15 ENCOUNTER — Encounter (HOSPITAL_COMMUNITY): Payer: Self-pay | Admitting: General Practice

## 2018-10-15 ENCOUNTER — Inpatient Hospital Stay: Payer: Self-pay | Admitting: Internal Medicine

## 2018-10-15 LAB — CLOSTRIDIUM DIFFICILE BY PCR, REFLEXED: Toxigenic C. Difficile by PCR: POSITIVE — AB

## 2018-10-15 LAB — C DIFFICILE QUICK SCREEN W PCR REFLEX
C Diff antigen: POSITIVE — AB
C Diff toxin: NEGATIVE

## 2018-10-15 LAB — CBC
HCT: 31.4 % — ABNORMAL LOW (ref 36.0–46.0)
Hemoglobin: 9.6 g/dL — ABNORMAL LOW (ref 12.0–15.0)
MCH: 26.6 pg (ref 26.0–34.0)
MCHC: 30.6 g/dL (ref 30.0–36.0)
MCV: 87 fL (ref 80.0–100.0)
Platelets: 320 10*3/uL (ref 150–400)
RBC: 3.61 MIL/uL — ABNORMAL LOW (ref 3.87–5.11)
RDW: 16.1 % — ABNORMAL HIGH (ref 11.5–15.5)
WBC: 14.4 10*3/uL — ABNORMAL HIGH (ref 4.0–10.5)
nRBC: 0 % (ref 0.0–0.2)

## 2018-10-15 LAB — BASIC METABOLIC PANEL
Anion gap: 9 (ref 5–15)
BUN: 8 mg/dL (ref 8–23)
CO2: 23 mmol/L (ref 22–32)
Calcium: 7.6 mg/dL — ABNORMAL LOW (ref 8.9–10.3)
Chloride: 104 mmol/L (ref 98–111)
Creatinine, Ser: 0.72 mg/dL (ref 0.44–1.00)
GFR calc Af Amer: >60 mL/min (ref >60)
GFR calc non Af Amer: >60 mL/min (ref >60)
Glucose, Bld: 91 mg/dL (ref 70–99)
Potassium: 3.3 mmol/L — ABNORMAL LOW (ref 3.5–5.1)
Sodium: 136 mmol/L (ref 135–145)

## 2018-10-15 MED ORDER — POTASSIUM CHLORIDE CRYS ER 20 MEQ PO TBCR
40.0000 meq | EXTENDED_RELEASE_TABLET | Freq: Once | ORAL | Status: AC
  Administered 2018-10-15: 40 meq via ORAL
  Filled 2018-10-15: qty 2

## 2018-10-15 NOTE — Evaluation (Signed)
Occupational Therapy Evaluation Patient Details Name: [name] MRN: [id number] DOB: 07-02-1927 Today's Date: 10/15/2018    History of Present Illness Pt is an 84 y.o. female admitted 10/13/18 with worsening weakness and diarrhea; worked up for perforated diverticulitis and C diff. Of note, multiple recent admissions with similar symptoms. PMH includes HTN, IBS, RA, hiatal hernia, a-fib.   Clinical Impression   Pt with decline in function and safety with ADLs and ADL mobility with decreased strength, balance and endurance. Pt reports that she was independent with selfcare and mobility PTA. Pt would benefit from acute OT services to address impairments to maximize level of function and safety    Follow Up Recommendations  SNF    Equipment Recommendations  None recommended by OT(TBD at next venue of care)    Recommendations for Other Services       Precautions / Restrictions Precautions Precautions: Fall;Other (comment) Precaution Comments: Bowel incontinence (c diff) Restrictions Weight Bearing Restrictions: No      Mobility Bed Mobility Overal bed mobility: Needs Assistance Bed Mobility: Supine to Sit     Supine to sit: Supervision Sit to supine: Supervision;HOB elevated   General bed mobility comments: Bowel incontinence upon rolling  Transfers Overall transfer level: Needs assistance Equipment used: Rolling walker (2 wheeled) Transfers: Sit to/from Stand Sit to Stand: Min assist Stand pivot transfers: Min assist       General transfer comment: MinA to stand from bed and BSC     Balance Overall balance assessment: Needs assistance Sitting-balance support: Feet supported Sitting balance-Leahy Scale: Fair     Standing balance support: Bilateral upper extremity supported;During functional activity Standing balance-Leahy Scale: Fair Standing balance comment: Pt stood at [facility] for OT to assist with hygiene                           ADL  either performed or assessed with clinical judgement   ADL Overall ADL's : Needs assistance/impaired Eating/Feeding: Sitting;Independent   Grooming: Set up;Sitting;Wash/dry face;Wash/dry hands   Upper Body Bathing: Set up;Supervision/ safety;Sitting   Lower Body Bathing: Moderate assistance;Sit to/from stand   Upper Body Dressing : Sitting;Set up;Supervision/safety   Lower Body Dressing: Moderate assistance;Sit to/from stand   Toilet Transfer: Minimal assistance;Stand-pivot;RW;BSC;Ambulation   Toileting- Clothing Manipulation and Hygiene: Sit to/from stand;Moderate assistance       Functional mobility during ADLs: Minimal assistance;Rolling walker       Vision Baseline Vision/History: Wears glasses Wears Glasses: Reading only Patient Visual Report: No change from baseline       Perception     Praxis      Pertinent Vitals/Pain Pain Assessment: No/denies pain     Hand Dominance Right   Extremity/Trunk Assessment Upper Extremity Assessment Upper Extremity Assessment: Generalized weakness   Lower Extremity Assessment Lower Extremity Assessment: Defer to PT evaluation   Cervical / Trunk Assessment Cervical / Trunk Assessment: Kyphotic   Communication Communication Communication: No difficulties   Cognition Arousal/Alertness: Awake/alert Behavior During Therapy: WFL for tasks assessed/performed Overall Cognitive Status: Within Functional Limits for tasks assessed                                     General Comments       Exercises     Shoulder Instructions      Home Living Family/patient expects to be discharged to:: Skilled nursing facility Living Arrangements: Children Available  Help at Discharge: Family;Available 24 hours/day Type of Home: House Home Access: Level entry;Ramped entrance     Home Layout: One level     Bathroom Shower/Tub: Occupational psychologist: Standard     Home Equipment: Environmental consultant - 4 wheels;Shower  seat - built in          Prior Functioning/Environment Level of Independence: Independent with assistive device(s);Needs assistance  Gait / Transfers Assistance Needed: Ambulatory with rollator; days leading to admission, son having to assist pt to bathroom secondary to weakness ADL's / Homemaking Assistance Needed: pt reports she does not require assist for ADLs            OT Problem List: Decreased strength;Decreased activity tolerance;Impaired balance (sitting and/or standing);Decreased knowledge of use of DME or AE      OT Treatment/Interventions: Self-care/ADL training;Therapeutic exercise;DME and/or AE instruction;Therapeutic activities;Visual/perceptual remediation/compensation;Patient/family education    OT Goals(Current goals can be found in the care plan section) Acute Rehab OT Goals Patient Stated Goal: Agreeable to SNF in order to regain strength so son does not have to help her so much at home OT Goal Formulation: With patient Time For Goal Achievement: 10/29/18 Potential to Achieve Goals: Good  OT Frequency: Min 2X/week   Barriers to D/C: Decreased caregiver support          Co-evaluation              AM-PAC OT "6 Clicks" Daily Activity     Outcome Measure Help from another person eating meals?: None Help from another person taking care of personal grooming?: A Little Help from another person toileting, which includes using toliet, bedpan, or urinal?: A Lot Help from another person bathing (including washing, rinsing, drying)?: A Lot Help from another person to put on and taking off regular upper body clothing?: A Little Help from another person to put on and taking off regular lower body clothing?: A Lot 6 Click Score: 16   End of Session Equipment Utilized During Treatment: Gait belt;Rolling walker;Other (comment)(BSC) Nurse Communication: Mobility status  Activity Tolerance: Patient tolerated treatment well Patient left: in chair;with call  bell/phone within reach;with chair alarm set  OT Visit Diagnosis: Muscle weakness (generalized) (M62.81);Other abnormalities of gait and mobility (R26.89)                Time: 5885-0277 OT Time Calculation (min): 31 min Charges:  OT General Charges $OT Visit: 1 Visit OT Evaluation $OT Eval Moderate Complexity: 1 Mod OT Treatments $Therapeutic Activity: 8-22 mins    Britt Bottom 10/15/2018, 1:20 PM

## 2018-10-15 NOTE — Plan of Care (Signed)
  Problem: Education: Goal: Knowledge of General Education information will improve Description: Including pain rating scale, medication(s)/side effects and non-pharmacologic comfort measures Outcome: Progressing   Problem: Clinical Measurements: Goal: Ability to maintain clinical measurements within normal limits will improve Outcome: Progressing   Problem: Clinical Measurements: Goal: Respiratory complications will improve Outcome: Progressing   Problem: Clinical Measurements: Goal: Cardiovascular complication will be avoided Outcome: Progressing   

## 2018-10-15 NOTE — Consult Note (Signed)
Subjective: CC: Left hydronephrosis.  Hx:  I was asked to see the patient in consultation by Dr. Tawanna Solo for progressive left hydronephrosis found on recent CT for diverticulitis.  She has an enlarging cystic left ovarian mass that appears to be the cause of obstruction.  She has no flank pain or hematuria.  Her UA is clear and her Cr is 0.72.  She has no prior GU history.   She has multiple comorbities and is not considered a good surgical candidate.  ROS:  Review of Systems  Gastrointestinal: Positive for diarrhea.  All other systems reviewed and are negative.   Allergies  Allergen Reactions  . Infliximab Other (See Comments)    REACTION: "Enlarged intestines and hernia. Also pushed intestines on lower part of lungs." REACTION: "Enlarged intestines and hernia. Also pushed intestines on lower part of lungs."  . Aspirin Nausea And Vomiting    Low Dose is ok Stomach problems  . Carvedilol Other (See Comments)    Fatigue/ ill Fatigue/ ill  . Codeine Nausea And Vomiting    "Deathly Sick"  . Penicillins Rash    Has patient had a PCN reaction causing immediate rash, facial/tongue/throat swelling, SOB or lightheadedness with hypotension: Y Has patient had a PCN reaction causing severe rash involving mucus membranes or skin necrosis: Y Has patient had a PCN reaction that required hospitalization: N Has patient had a PCN reaction occurring within the last 10 years: N If all of the above answers are "NO", then may proceed with Cephalosporin use.     Past Medical History:  Diagnosis Date  . A-fib ([place])   . Aortic stenosis, moderate    last echo in 2010; AVA 1.1; mild AS per echo in June 2013  . Edema of both legs    s/p laser treatment per Dr. Donnetta Hutching  . Hiatal hernia   . History of chicken pox   . HLD (hyperlipidemia)   . HTN (hypertension)   . Hypothyroidism   . IBS (irritable bowel syndrome)   . Iron deficiency anemia   . Neuropathic pain 09/06/2015  . Rheumatoid arthritis  [facility])     Past Surgical History:  Procedure Laterality Date  . APPENDECTOMY  1955  . CARPAL TUNNEL RELEASE Right   . CATARACT EXTRACTION    . ENDOSCOPIC VEIN LASER TREATMENT    . ENDOVENOUS ABLATION SAPHENOUS VEIN W/ LASER  08-29-2012   left greater saphenous vein   Sherren Mocha Early MD  . ENDOVENOUS ABLATION SAPHENOUS VEIN W/ LASER  09-19-2012   right greater saphenous vein by Curt Jews MD  . EYE SURGERY  2005   Bilateral cataract  . FOOT SURGERY  2003   bilateral Hammer Toe  . stab phlebectomy Right 01-02-2013   10-15 incisions right thigh and calf by Curt Jews MD    Social History   Socioeconomic History  . Marital status: Widowed    Spouse name: Not on file  . Number of children: 2  . Years of education: Not on file  . Highest education level: Not on file  Occupational History  . Occupation: retired  Scientific laboratory technician  . Financial resource strain: Not on file  . Food insecurity:    Worry: Not on file    Inability: Not on file  . Transportation needs:    Medical: Not on file    Non-medical: Not on file  Tobacco Use  . Smoking status: Former Smoker    Types: Cigarettes    Last attempt to quit: 10/31/1983  Years since quitting: 34.9  . Smokeless tobacco: Never Used  Substance and Sexual Activity  . Alcohol use: No  . Drug use: No  . Sexual activity: Never  Lifestyle  . Physical activity:    Days per week: Not on file    Minutes per session: Not on file  . Stress: Not on file  Relationships  . Social connections:    Talks on phone: Not on file    Gets together: Not on file    Attends religious service: Not on file    Active member of club or organization: Not on file    Attends meetings of clubs or organizations: Not on file    Relationship status: Not on file  . Intimate partner violence:    Fear of current or ex partner: Not on file    Emotionally abused: Not on file    Physically abused: Not on file    Forced sexual activity: Not on file  Other Topics  Concern  . Not on file  Social History Narrative   Lives by herself     Family History  Problem Relation Age of Onset  . Heart disease Mother   . Peripheral vascular disease Mother   . Heart disease Father   . Peripheral vascular disease Father        Right leg amputation  . COPD Father   . Heart disease Brother 72       Heart Disease before age 75  . Heart attack Brother   . Peripheral vascular disease Other   . Hypertension Sister     Anti-infectives: Anti-infectives (From admission, onward)   Start     Dose/Rate Route Frequency Ordered Stop   10/14/18 1700  ertapenem (INVANZ) 1,000 mg in sodium chloride 0.9 % 100 mL IVPB     1 g 200 mL/hr over 30 Minutes Intravenous Every 24 hours 10/14/18 1623     10/13/18 1200  ertapenem (INVANZ) 1,000 mg in sodium chloride 0.9 % 100 mL IVPB     1 g 200 mL/hr over 30 Minutes Intravenous Every 24 hours 10/13/18 0909 10/13/18 1334   10/13/18 1000  vancomycin (VANCOCIN) 50 mg/mL oral solution 125 mg     125 mg Oral 4 times daily 10/13/18 0453 10/23/18 0959   10/13/18 0900  ertapenem [facility]) IVPB  Status:  Discontinued    Note to Pharmacy:  Indication:  Recurrent diverticulitis with perforation Last Day of Therapy:  10/13/18 Labs - Once weekly:  CBC/D and BMP, Labs - Every other week:  ESR and CRP     1 g Intravenous Every 24 hours 10/13/18 0852 10/13/18 0903   10/13/18 0215  vancomycin (VANCOCIN) 1,250 mg in sodium chloride 0.9 % 250 mL IVPB     1,250 mg 166.7 mL/hr over 90 Minutes Intravenous  Once 10/13/18 0205 10/13/18 0450      Current Facility-Administered Medications  Medication Dose Route Frequency Provider Last Rate Last Dose  . 0.9 %  sodium chloride infusion   Intravenous Continuous Annita Brod, MD 75 mL/hr at 10/15/18 1304    . acetaminophen (TYLENOL) tablet 650 mg  650 mg Oral Q6H PRN Annita Brod, MD   650 mg at 10/14/18 1638  . ALPRAZolam Duanne Moron) tablet 0.25 mg  0.25 mg Oral QHS PRN Annita Brod, MD       . aspirin chewable tablet 81 mg  81 mg Oral Daily Annita Brod, MD   81 mg at 10/15/18 0858  .  ertapenem (INVANZ) 1,000 mg in sodium chloride 0.9 % 100 mL IVPB  1 g Intravenous Q24H Gevena Barre K, MD 200 mL/hr at 10/15/18 1659 1,000 mg at 10/15/18 1659  . feeding supplement (ENSURE ENLIVE) (ENSURE ENLIVE) liquid 237 mL  237 mL Oral BID BM Annita Brod, MD   237 mL at 10/15/18 0901  . gabapentin (NEURONTIN) capsule 100 mg  100 mg Oral QHS Annita Brod, MD   100 mg at 10/14/18 2101  . Gerhardt's butt cream   Topical QID Annita Brod, MD      . levothyroxine (SYNTHROID, LEVOTHROID) tablet 75 mcg  75 mcg Oral QAC breakfast Annita Brod, MD   75 mcg at 10/15/18 4332  . metoprolol succinate (TOPROL-XL) 24 hr tablet 75 mg  75 mg Oral Daily Annita Brod, MD   75 mg at 10/15/18 0858  . metoprolol tartrate (LOPRESSOR) injection 5 mg  5 mg Intravenous Q6H PRN Annita Brod, MD      . multivitamin with minerals tablet 1 tablet  1 tablet Oral Daily Annita Brod, MD   1 tablet at 10/15/18 0858  . ondansetron (ZOFRAN) tablet 4 mg  4 mg Oral Q6H PRN Annita Brod, MD       Or  . ondansetron [facility]) injection 4 mg  4 mg Intravenous Q6H PRN Annita Brod, MD      . sodium chloride flush (NS) 0.9 % injection 10-40 mL  10-40 mL Intracatheter Q12H Annita Brod, MD   10 mL at 10/13/18 2239  . sodium chloride flush (NS) 0.9 % injection 10-40 mL  10-40 mL Intracatheter PRN Annita Brod, MD   20 mL at 10/15/18 0343  . vancomycin (VANCOCIN) 50 mg/mL oral solution 125 mg  125 mg Oral QID Annita Brod, MD   125 mg at 10/15/18 1655     Objective: Vital signs in last 24 hours: Temp:  [97.9 F (36.6 C)-99.4 F (37.4 C)] 98.3 F (36.8 C) (12/17 1557) Pulse Rate:  [84-111] 111 (12/17 1557) Resp:  [16-18] 17 (12/17 1557) BP: (122-165)/(73-93) 156/92 (12/17 1557) SpO2:  [99 %-100 %] 99 % (12/17 1557) Weight:  [52.2 kg] 52.2 kg (12/17  0534)  Intake/Output from previous day: 12/16 0701 - 12/17 0700 In: 2305.8 [P.O.:580; I.V.:1625.8; IV Piggyback:100.1] Out: -  Intake/Output this shift: Total I/O In: -  Out: 200 [Urine:200]   Physical Exam Constitutional:      Appearance: Normal appearance.  Neck:     Musculoskeletal: Normal range of motion and neck supple.  Cardiovascular:     Rate and Rhythm: Tachycardia present.  Pulmonary:     Effort: Pulmonary effort is normal. No respiratory distress.  Abdominal:     General: Abdomen is flat.     Palpations: Abdomen is soft.     Tenderness: There is no abdominal tenderness.  Musculoskeletal: Normal range of motion.        General: No swelling or tenderness.  Skin:    General: Skin is warm and dry.  Neurological:     General: No focal deficit present.     Mental Status: She is alert and oriented to person, place, and time.  Psychiatric:        Mood and Affect: Mood normal.        Behavior: Behavior normal.     Lab Results:  Recent Labs    10/14/18 0334 10/15/18 0337  WBC 11.6* 14.4*  HGB 9.1* 9.6*  HCT 29.6*  31.4*  PLT 273 320   BMET Recent Labs    10/14/18 0334 10/15/18 0337  NA 136 136  K 3.1* 3.3*  CL 103 104  CO2 24 23  GLUCOSE 99 91  BUN 10 8  CREATININE 0.75 0.72  CALCIUM 7.6* 7.6*   PT/INR Recent Labs    10/13/18 0205  LABPROT 14.8  INR 1.17   ABG No results for input(s): PHART, HCO3 in the last 72 hours.  Invalid input(s): PCO2, PO2  Studies/Results: No results found.   Assessment: Asymptomatic progressive left hydronephrosis secondary to a cystic left ovarian mass.    Our options are to continue observation or consider ureteral stent.  Her renal function is adequate and she has no evidence of UTI.   She is not interested in intervention at this time.  She could f/u with [name] as an outpatient in 4-6 weeks for reevaluation.       CC: Dr. Shelly Coss     Irine Seal 10/15/2018 [phone]

## 2018-10-15 NOTE — Clinical Social Work Note (Signed)
Provided patient with list of bed offers and Medicare.gov scores. Her preferences were [facility] or [place] but neither have beds. Patient will review bed offers with her son and CSW will follow up with decision.  Dayton Scrape, [place]

## 2018-10-15 NOTE — Progress Notes (Signed)
PROGRESS NOTE    [name]  DZH:[id number] DOB: 1927-09-20 DOA: 10/13/2018 PCP: [name], NP   Brief Narrative: Patient is 84 year old female with past medical history of recurrent perforated diverticulitis, C. difficile with previous hospitalization admitted  for the management of recurrent C. difficile, acute diverticulitis and worsening left hydroureter nephrosis.  Started on IV antibiotics and oral vancomycin. She was recently discharged approximately 2 weeks ago with plans to be on IV Invanz and p.o. vancomycin.  Unfortunately, she states that she was not able to get her p.o. vancomycin filled and during that time her diarrhea had gotten worse.  She is not felt to be a surgical candidate given history of aortic stenosis and other medical issues.  In addition, she was found to have a left ovarian mass 2 months ago with plans for outpatient OB/GYN follow-up which she did not yet get. Currently her abdomen pain is much better. Physical therapy evaluated her and recommended skilled nursing facility on discharge.  Assessment & Plan:   Active Problems:   HLD (hyperlipidemia)   Hypothyroidism   Atrial fibrillation, chronic   Essential hypertension   CKD (chronic kidney disease) stage 2, GFR 60-89 ml/min   A-fib (HCC)   Acute diverticulitis   Clostridium difficile colitis   Hydronephrosis   Ovarian mass   C. difficile colitis  C. difficile: Recurrent.  Started on p.o. Vanco.  Patient states she still has loose bowel movements.  C. difficile has been ordered.  Acute diverticulitis: Currently on Invanz.  Afebrile.  Abdominal pain is much better.  Started on diet. Imagings on presentation showed continued severe rectosigmoid diverticulitis/colitis. Residual pelvic phlegmon, without drainable fluid collection.   Chronic A. fib: Continue metoprolol.Chads 2 score of 3.  Not on anticoagulation.  Hypertension: Continue PRN hydralazine for now.  CKD stage II: Currently  stable  Hypokalemia: Supplemented with potassium.  Hypothyroidism: Continue Synthyroid  Hydronephrosis/ovarian mass: Has chronic left ovarian mass for decades.  Plan is to follow-up with GYN as an outpatient.  Imaging showed left-sided hydronephrosis.  Will request for urology evaluation today. Imaging showed:Redemonstration of complex cystic LEFT ovarian mass, air tracking along the LEFT fallopian tube concerning for fistula. Recommend non emergent pelvic ultrasound for further characterization of LEFT ovarian mass. Worsening mild LEFT hydroureteronephrosis obstructed at the level of the LEFT ovarian mass.  Deconditioning/debility: Physical therapy evaluated  the patient recommended skilled facility on discharge.         DVT prophylaxis: SCD Code Status: Full Family Communication: None present at the bedside Disposition Plan: Skilled nursing facility after bed is available and improvement in the diarrhea, pending urology evaluation   Consultants: None  Procedures: None  Antimicrobials: Vancomycin, Invanz since 12/15  Subjective: Patient seen and examined at bedside this morning.  Remains hemodynamically stable.  Comfortable during my evaluation.  Denies any abdominal pain.  Still complains of frequent loose bowel movements.  Objective: Vitals:   10/15/18 0105 10/15/18 0534 10/15/18 0858 10/15/18 0928  BP: (!) 165/79 140/86 (!) 154/76 (!) 157/93  Pulse: 97 84 85 97  Resp: 16 18  17   Temp: 99.4 F (37.4 C) 97.9 F (36.6 C)  99 F (37.2 C)  TempSrc: Oral Oral  Oral  SpO2: 100% 100%  100%  Weight:  52.2 kg    Height:        Intake/Output Summary (Last 24 hours) at 10/15/2018 1000 Last data filed at 10/15/2018 0959 Gross per 24 hour  Intake 2105.83 ml  Output 200 ml  Net 1905.83 ml   Filed Weights   10/13/18 0156 10/15/18 0534  Weight: 59.9 kg 52.2 kg    Examination:  General exam: Appears calm and comfortable ,Not in distress,thin built HEENT:PERRL,Oral  mucosa moist, Ear/Nose normal on gross exam Respiratory system: Bilateral equal air entry, normal vesicular breath sounds, no wheezes or crackles  Cardiovascular system: S1 & S2 heard, RRR. No JVD, murmurs, rubs, gallops or clicks. No pedal edema. Gastrointestinal system: Abdomen is mildly distended, soft and nontender. No organomegaly or masses felt. Normal bowel sounds heard. Central nervous system: Alert and oriented. No focal neurological deficits. Extremities: No edema, no clubbing ,no cyanosis, distal peripheral pulses palpable. Skin: No rashes, lesions or ulcers,no icterus ,no pallor Psychiatry: Judgement and insight appear normal. Mood & affect appropriate.     Data Reviewed: I have personally reviewed following labs and imaging studies  CBC: Recent Labs  Lab 10/09/18 10/13/18 0205 10/14/18 0334 10/15/18 0337  WBC 8.2 15.7* 11.6* 14.4*  NEUTROABS 5 11.8*  --   --   HGB 10.7* 11.0* 9.1* 9.6*  HCT 32* 35.8* 29.6* 31.4*  MCV  --  86.9 85.5 87.0  PLT 405* 316 273 182   Basic Metabolic Panel: Recent Labs  Lab 10/09/18 10/13/18 0205 10/14/18 0334 10/15/18 0337  NA 137 134* 136 136  K 4.3 4.2 3.1* 3.3*  CL  --  98 103 104  CO2  --  23 24 23   GLUCOSE  --  113* 99 91  BUN 11 10 10 8   CREATININE 0.7 0.98 0.75 0.72  CALCIUM 8.6 8.3* 7.6* 7.6*   GFR: Estimated Creatinine Clearance: 34.2 mL/min (by C-G formula based on SCr of 0.72 mg/dL). Liver Function Tests: Recent Labs  Lab 10/13/18 0205  AST 24  ALT 8  ALKPHOS 61  BILITOT 0.8  PROT 6.6  ALBUMIN 2.5*   No results for input(s): LIPASE, AMYLASE in the last 168 hours. No results for input(s): AMMONIA in the last 168 hours. Coagulation Profile: Recent Labs  Lab 10/13/18 0205  INR 1.17   Cardiac Enzymes: No results for input(s): CKTOTAL, CKMB, CKMBINDEX, TROPONINI in the last 168 hours. BNP (last 3 results) No results for input(s): PROBNP in the last 8760 hours. HbA1C: No results for input(s): HGBA1C in the  last 72 hours. CBG: No results for input(s): GLUCAP in the last 168 hours. Lipid Profile: No results for input(s): CHOL, HDL, LDLCALC, TRIG, CHOLHDL, LDLDIRECT in the last 72 hours. Thyroid Function Tests: No results for input(s): TSH, T4TOTAL, FREET4, T3FREE, THYROIDAB in the last 72 hours. Anemia Panel: No results for input(s): VITAMINB12, FOLATE, FERRITIN, TIBC, IRON, RETICCTPCT in the last 72 hours. Sepsis Labs: Recent Labs  Lab 10/13/18 0232 10/14/18 1713  LATICACIDVEN 1.60 0.8    Recent Results (from the past 240 hour(s))  Blood Culture (routine x 2)     Status: None (Preliminary result)   Collection Time: 10/13/18  2:10 AM  Result Value Ref Range Status   Specimen Description BLOOD LEFT ANTECUBITAL  Final   Special Requests   Final    BOTTLES DRAWN AEROBIC AND ANAEROBIC Blood Culture results may not be optimal due to an inadequate volume of blood received in culture bottles   Culture   Final    NO GROWTH 2 DAYS Performed at [facility] [address] [place], [place] [address]    Report Status PENDING  Incomplete  Blood Culture (routine x 2)     Status: None (Preliminary result)  Collection Time: 10/13/18  2:28 AM  Result Value Ref Range Status   Specimen Description BLOOD LEFT HAND  Final   Special Requests   Final    BOTTLES DRAWN AEROBIC AND ANAEROBIC Blood Culture results may not be optimal due to an inadequate volume of blood received in culture bottles   Culture   Final    NO GROWTH 2 DAYS Performed at [facility], [place] [address] [place],  [address]    Report Status PENDING  Incomplete         Radiology Studies: No results found.      Scheduled Meds: . aspirin  81 mg Oral Daily  . feeding supplement (ENSURE ENLIVE)  237 mL Oral BID BM  . gabapentin  100 mg Oral QHS  . Gerhardt's butt cream   Topical QID  . levothyroxine  75 mcg Oral QAC breakfast  . metoprolol succinate  75 mg Oral Daily  . multivitamin with minerals  1  tablet Oral Daily  . potassium chloride  40 mEq Oral Once  . sodium chloride flush  10-40 mL Intracatheter Q12H  . vancomycin  125 mg Oral QID   Continuous Infusions: . sodium chloride 75 mL/hr at 10/15/18 0352  . ertapenem Stopped (10/14/18 1919)     LOS: 2 days    Time spent: 35 mins.More than 50% of that time was spent in counseling and/or coordination of care.      Shelly Coss, MD [facility] Pager [phone]  If 7PM-7AM, please contact night-coverage [email or web address] Password [email or web address] 10/15/2018, 10:00 AM

## 2018-10-15 NOTE — Progress Notes (Signed)
Received orders for potassium PO - gave to Pt prior to transfer to [facility] [address]. Pt taken to [place], she requested that I not call her son and notify him of her transfer this night, due to fact that he would be asleep.  She will let him know in the AM

## 2018-10-15 NOTE — Progress Notes (Signed)
Pt's Potassium level was 3.1 on 01/12/2018 lab, no corrections orders. Paged on call MD/PA for possible orders before Pt is transferred to [facility] , per the request of transferring unit.

## 2018-10-15 NOTE — Plan of Care (Signed)
  Problem: Education: Goal: Knowledge of General Education information will improve Description Including pain rating scale, medication(s)/side effects and non-pharmacologic comfort measures Outcome: Progressing   Problem: Clinical Measurements: Goal: Ability to maintain clinical measurements within normal limits will improve Outcome: Progressing Goal: Will remain free from infection Outcome: Progressing Goal: Respiratory complications will improve Outcome: Progressing Goal: Cardiovascular complication will be avoided Outcome: Progressing   Problem: Coping: Goal: Level of anxiety will decrease Outcome: Progressing   Problem: Elimination: Goal: Will not experience complications related to bowel motility Outcome: Progressing Goal: Will not experience complications related to urinary retention Outcome: Progressing   Problem: Safety: Goal: Ability to remain free from injury will improve Outcome: Progressing   Problem: Skin Integrity: Goal: Risk for impaired skin integrity will decrease Outcome: Progressing

## 2018-10-16 ENCOUNTER — Other Ambulatory Visit: Payer: Self-pay | Admitting: Internal Medicine

## 2018-10-16 DIAGNOSIS — Z87891 Personal history of nicotine dependence: Secondary | ICD-10-CM | POA: Diagnosis not present

## 2018-10-16 DIAGNOSIS — Z886 Allergy status to analgesic agent status: Secondary | ICD-10-CM | POA: Diagnosis not present

## 2018-10-16 DIAGNOSIS — N838 Other noninflammatory disorders of ovary, fallopian tube and broad ligament: Secondary | ICD-10-CM | POA: Diagnosis not present

## 2018-10-16 DIAGNOSIS — Z95828 Presence of other vascular implants and grafts: Secondary | ICD-10-CM | POA: Diagnosis not present

## 2018-10-16 DIAGNOSIS — Z8619 Personal history of other infectious and parasitic diseases: Secondary | ICD-10-CM | POA: Diagnosis not present

## 2018-10-16 DIAGNOSIS — Z885 Allergy status to narcotic agent status: Secondary | ICD-10-CM | POA: Diagnosis not present

## 2018-10-16 DIAGNOSIS — K5720 Diverticulitis of large intestine with perforation and abscess without bleeding: Secondary | ICD-10-CM

## 2018-10-16 DIAGNOSIS — Z8719 Personal history of other diseases of the digestive system: Secondary | ICD-10-CM

## 2018-10-16 DIAGNOSIS — N182 Chronic kidney disease, stage 2 (mild): Secondary | ICD-10-CM | POA: Diagnosis not present

## 2018-10-16 DIAGNOSIS — Z888 Allergy status to other drugs, medicaments and biological substances status: Secondary | ICD-10-CM | POA: Diagnosis not present

## 2018-10-16 DIAGNOSIS — Z7189 Other specified counseling: Secondary | ICD-10-CM

## 2018-10-16 DIAGNOSIS — Z880 Allergy status to penicillin: Secondary | ICD-10-CM

## 2018-10-16 DIAGNOSIS — K5792 Diverticulitis of intestine, part unspecified, without perforation or abscess without bleeding: Secondary | ICD-10-CM | POA: Diagnosis not present

## 2018-10-16 DIAGNOSIS — Z5329 Procedure and treatment not carried out because of patient's decision for other reasons: Secondary | ICD-10-CM

## 2018-10-16 DIAGNOSIS — M0579 Rheumatoid arthritis with rheumatoid factor of multiple sites without organ or systems involvement: Secondary | ICD-10-CM | POA: Diagnosis not present

## 2018-10-16 DIAGNOSIS — M792 Neuralgia and neuritis, unspecified: Secondary | ICD-10-CM | POA: Diagnosis not present

## 2018-10-16 DIAGNOSIS — A0471 Enterocolitis due to Clostridium difficile, recurrent: Principal | ICD-10-CM

## 2018-10-16 DIAGNOSIS — E440 Moderate protein-calorie malnutrition: Secondary | ICD-10-CM | POA: Diagnosis not present

## 2018-10-16 DIAGNOSIS — E039 Hypothyroidism, unspecified: Secondary | ICD-10-CM | POA: Diagnosis not present

## 2018-10-16 DIAGNOSIS — R41841 Cognitive communication deficit: Secondary | ICD-10-CM | POA: Diagnosis not present

## 2018-10-16 DIAGNOSIS — R262 Difficulty in walking, not elsewhere classified: Secondary | ICD-10-CM | POA: Diagnosis not present

## 2018-10-16 DIAGNOSIS — M6281 Muscle weakness (generalized): Secondary | ICD-10-CM | POA: Diagnosis not present

## 2018-10-16 DIAGNOSIS — Z792 Long term (current) use of antibiotics: Secondary | ICD-10-CM | POA: Diagnosis not present

## 2018-10-16 DIAGNOSIS — N83202 Unspecified ovarian cyst, left side: Secondary | ICD-10-CM | POA: Diagnosis not present

## 2018-10-16 DIAGNOSIS — Z959 Presence of cardiac and vascular implant and graft, unspecified: Secondary | ICD-10-CM | POA: Diagnosis not present

## 2018-10-16 DIAGNOSIS — E876 Hypokalemia: Secondary | ICD-10-CM | POA: Diagnosis not present

## 2018-10-16 DIAGNOSIS — I129 Hypertensive chronic kidney disease with stage 1 through stage 4 chronic kidney disease, or unspecified chronic kidney disease: Secondary | ICD-10-CM | POA: Diagnosis not present

## 2018-10-16 DIAGNOSIS — I4821 Permanent atrial fibrillation: Secondary | ICD-10-CM | POA: Diagnosis not present

## 2018-10-16 DIAGNOSIS — I10 Essential (primary) hypertension: Secondary | ICD-10-CM | POA: Diagnosis not present

## 2018-10-16 DIAGNOSIS — N1330 Unspecified hydronephrosis: Secondary | ICD-10-CM | POA: Diagnosis not present

## 2018-10-16 DIAGNOSIS — A0472 Enterocolitis due to Clostridium difficile, not specified as recurrent: Secondary | ICD-10-CM | POA: Diagnosis not present

## 2018-10-16 DIAGNOSIS — E785 Hyperlipidemia, unspecified: Secondary | ICD-10-CM | POA: Diagnosis not present

## 2018-10-16 DIAGNOSIS — M2550 Pain in unspecified joint: Secondary | ICD-10-CM | POA: Diagnosis not present

## 2018-10-16 DIAGNOSIS — R2681 Unsteadiness on feet: Secondary | ICD-10-CM | POA: Diagnosis not present

## 2018-10-16 DIAGNOSIS — I482 Chronic atrial fibrillation, unspecified: Secondary | ICD-10-CM | POA: Diagnosis not present

## 2018-10-16 DIAGNOSIS — Z7401 Bed confinement status: Secondary | ICD-10-CM | POA: Diagnosis not present

## 2018-10-16 LAB — CBC WITH DIFFERENTIAL/PLATELET
Abs Immature Granulocytes: 0.1 10*3/uL — ABNORMAL HIGH (ref 0.00–0.07)
Basophils Absolute: 0.1 10*3/uL (ref 0.0–0.1)
Basophils Relative: 1 %
Eosinophils Absolute: 0.5 10*3/uL (ref 0.0–0.5)
Eosinophils Relative: 5 %
HCT: 29 % — ABNORMAL LOW (ref 36.0–46.0)
Hemoglobin: 9.2 g/dL — ABNORMAL LOW (ref 12.0–15.0)
Immature Granulocytes: 1 %
Lymphocytes Relative: 17 %
Lymphs Abs: 1.7 10*3/uL (ref 0.7–4.0)
MCH: 27.7 pg (ref 26.0–34.0)
MCHC: 31.7 g/dL (ref 30.0–36.0)
MCV: 87.3 fL (ref 80.0–100.0)
Monocytes Absolute: 0.6 10*3/uL (ref 0.1–1.0)
Monocytes Relative: 6 %
Neutro Abs: 7.1 10*3/uL (ref 1.7–7.7)
Neutrophils Relative %: 70 %
Platelets: 332 10*3/uL (ref 150–400)
RBC: 3.32 MIL/uL — ABNORMAL LOW (ref 3.87–5.11)
RDW: 16 % — ABNORMAL HIGH (ref 11.5–15.5)
WBC: 9.9 10*3/uL (ref 4.0–10.5)
nRBC: 0 % (ref 0.0–0.2)

## 2018-10-16 LAB — BASIC METABOLIC PANEL
Anion gap: 10 (ref 5–15)
BUN: 5 mg/dL — ABNORMAL LOW (ref 8–23)
CO2: 22 mmol/L (ref 22–32)
Calcium: 7.4 mg/dL — ABNORMAL LOW (ref 8.9–10.3)
Chloride: 105 mmol/L (ref 98–111)
Creatinine, Ser: 0.61 mg/dL (ref 0.44–1.00)
GFR calc Af Amer: >60 mL/min (ref >60)
GFR calc non Af Amer: >60 mL/min (ref >60)
Glucose, Bld: 86 mg/dL (ref 70–99)
Potassium: 3.3 mmol/L — ABNORMAL LOW (ref 3.5–5.1)
Sodium: 137 mmol/L (ref 135–145)

## 2018-10-16 MED ORDER — SODIUM CHLORIDE 0.9 % IV SOLN: 1.0000 g | INTRAVENOUS | 0 refills | Status: DC

## 2018-10-16 MED ORDER — VANCOMYCIN 50 MG/ML ORAL SOLUTION: 125.0000 mg | Freq: Every day | ORAL | Status: DC

## 2018-10-16 MED ORDER — VANCOMYCIN 50 MG/ML ORAL SOLUTION: 125.0000 mg | Freq: Two times a day (BID) | ORAL | Status: DC

## 2018-10-16 MED ORDER — ENSURE ENLIVE PO LIQD: 237.0000 mL | Freq: Two times a day (BID) | ORAL | 12 refills | Status: DC

## 2018-10-16 MED ORDER — VANCOMYCIN 50 MG/ML ORAL SOLUTION: 125.0000 mg | ORAL | Status: DC

## 2018-10-16 MED ORDER — VANCOMYCIN 50 MG/ML ORAL SOLUTION: 125.0000 mg | ORAL | 0 refills | Status: DC

## 2018-10-16 MED ORDER — ERTAPENEM IV (FOR PTA / DISCHARGE USE ONLY): 1.0000 g | INTRAVENOUS | 0 refills | Status: DC

## 2018-10-16 MED ORDER — VANCOMYCIN 50 MG/ML ORAL SOLUTION
125.0000 mg | Freq: Four times a day (QID) | ORAL | Status: DC
  Administered 2018-10-16: 125 mg via ORAL
  Filled 2018-10-16 (×3): qty 2.5

## 2018-10-16 MED ORDER — VANCOMYCIN 50 MG/ML ORAL SOLUTION: 125.0000 mg | Freq: Four times a day (QID) | ORAL | Status: DC

## 2018-10-16 MED ORDER — VANCOMYCIN 50 MG/ML ORAL SOLUTION: 125.0000 mg | Freq: Every day | ORAL | 0 refills | Status: DC

## 2018-10-16 NOTE — Consult Note (Signed)
   [facility] CM Inpatient Consult   10/16/2018  Matteson Blue 1927-04-08 [id number]  Patient screened for less than 30 day readmissions and high risk score for unplanned readmissions.  Chart review patient admitted with Diverticulitis and for skilled nursing facility.  No current [facility] Care Management needs to follow at facility noted.  For questions contact:   Natividad Brood, RN BSN [facility] Liaison  [phone] business mobile phone Toll free office [phone]

## 2018-10-16 NOTE — Progress Notes (Signed)
Patient ready for discharge. 

## 2018-10-16 NOTE — Clinical Social Work Note (Signed)
Clinical Social Worker facilitated patient discharge including contacting patient family and facility to confirm patient discharge plans.  Clinical information faxed to facility and family agreeable with plan.  CSW arranged ambulance transport via [facility] to [facility].  RN to call [phone] for report prior to discharge.  Clinical Social Worker will sign off for now as social work intervention is no longer needed. Please consult [name] again if new need arises.  [name], [name]

## 2018-10-16 NOTE — Plan of Care (Signed)
  Problem: Education: Goal: Knowledge of General Education information will improve Description Including pain rating scale, medication(s)/side effects and non-pharmacologic comfort measures 10/16/2018 1334 by Tristan Schroeder, RN Outcome: Adequate for Discharge 10/16/2018 1059 by Tristan Schroeder, RN Outcome: Progressing

## 2018-10-16 NOTE — Discharge Summary (Addendum)
Physician Discharge Summary  [name] BHA:[id number] DOB: 15-Jun-1927 DOA: 10/13/2018  PCP: [name], NP  Admit date: 10/13/2018 Discharge date: 10/16/2018  Admitted From: Home Disposition:  SNF Discharge Condition:Stable CODE STATUS:FULL Diet recommendation:  Dysphagia ,soft diet only   Brief/Interim Summary:  Patient is 85 year old female with past medical history of recurrent perforated diverticulitis, C. difficile with previous hospitalization admitted this time  for the management of recurrent C. difficile, acute diverticulitis and worsening left hydroureter nephrosis.  She was started on IV antibiotics and oral vancomycin. She was recently discharged approximately 2 weeks ago with plans to be on IV Invanz and p.o. vancomycin. Unfortunately, she states that she was not able to get her p.o. vancomycin filled and during that time her diarrhea had gotten worse. She is not felt to be a surgical candidate given history of aortic stenosis and other medical issues but she also adamantly denies for surgery.In addition, she was found to have a left ovarian mass which has been there for years with plans for outpatient OB/GYN follow-up. Currently her abdomen pain is much better.  Her diarrhea has slowed down .  C. difficile again came out to be positive on this admission. After discussion with Dr. Lucianne Lei Dam,we  decided to discharge her to skilled nursing facility with 1 more month of Invanz for the treatment of her severe diverticulitis and also vancomycin pulse therapy for C. difficile.  She has high risk of readmission in the near future because of the severity of her colitis.  There was also multiple discussions about goals of care  with palliative care in the past but it has been futile and patient does not want to discuss the CODE STATUS at present.She is a full code for now.  Following problems were addressed during her hospitalization:  C. difficile: Recurrent.  Started  on tapering p.o. Vanco.  Frequency of bowel movements have decreased.  Stool still is more firm today.  Acute diverticulitis: Currently on Invanz.  Afebrile.  Abdominal pain is much better.  Started on soft diet. Imagings on presentation showed continued severe rectosigmoid diverticulitis/colitis. Residual pelvic phlegmon, without drainable fluid collection.  Numerous discussion has been held in the past regarding the surgery.  She denies surgery at present so general surgery had signed off on her previous admissions. After discussion with ID, plan is to continue Invanz for 1 more month.  Chronic A. fib: Continue metoprolol.Chads 2 score of 3. Not on anticoagulation.  Hypertension: Blood pressure stable.  CKD stage II: Currently stable  Hypokalemia: Supplemented with potassium.  Hypothyroidism: Continue Synthyroid  Hydronephrosis/ovarian mass: Has chronic left ovarian mass for decades.  Plan is to follow-up with GYN as an outpatient.  Imaging showed left-sided hydronephrosis.  Requested for urology evaluation the plan is to follow-up as an outpatient.  Deconditioning/debility: Physical therapy evaluated  the patient recommended skilled facility on discharge.    Discharge Diagnoses:  Active Problems:   HLD (hyperlipidemia)   Hypothyroidism   Atrial fibrillation, chronic   Essential hypertension   CKD (chronic kidney disease) stage 2, GFR 60-89 ml/min   A-fib (HCC)   Acute diverticulitis   Clostridium difficile colitis   Hydronephrosis   Ovarian mass   C. difficile colitis   Goals of care, counseling/discussion    Discharge Instructions  Discharge Instructions    Diet general   Complete by:  As directed    Soft diet only   Discharge instructions   Complete by:  As directed    1)  Take prescribed medication as instructed. 2)Check CBC, BMP test in a week. 3)Follow up with urology in 4 weeks.  Name and number the provider group has been attached.   Increase  activity slowly   Complete by:  As directed      Allergies as of 10/16/2018      Reactions   Infliximab Other (See Comments)   REACTION: "Enlarged intestines and hernia. Also pushed intestines on lower part of lungs." REACTION: "Enlarged intestines and hernia. Also pushed intestines on lower part of lungs."   Aspirin Nausea And Vomiting   Low Dose is ok Stomach problems   Carvedilol Other (See Comments)   Fatigue/ ill Fatigue/ ill   Codeine Nausea And Vomiting   "Deathly Sick"   Penicillins Rash   Has patient had a PCN reaction causing immediate rash, facial/tongue/throat swelling, SOB or lightheadedness with hypotension: Y Has patient had a PCN reaction causing severe rash involving mucus membranes or skin necrosis: Y Has patient had a PCN reaction that required hospitalization: N Has patient had a PCN reaction occurring within the last 10 years: N If all of the above answers are "NO", then may proceed with Cephalosporin use.      Medication List    STOP taking these medications   ertapenem  IVPB Commonly known as:  INVANZ     TAKE these medications   acetaminophen 500 MG tablet Commonly known as:  TYLENOL Take 1,000 mg by mouth every 6 (six) hours as needed (joint pain).   ALPRAZolam 0.25 MG tablet Commonly known as:  XANAX Take 1 tablet (0.25 mg total) by mouth at bedtime as needed for anxiety. What changed:  when to take this   aspirin 81 MG chewable tablet Chew 81 mg by mouth daily.   ertapenem 1,000 mg in sodium chloride 0.9 % 100 mL Inject 1,000 mg into the vein daily for 27 days.   feeding supplement (ENSURE ENLIVE) Liqd Take 237 mLs by mouth 2 (two) times daily between meals. What changed:  Another medication with the same name was added. Make sure you understand how and when to take each.   feeding supplement (ENSURE ENLIVE) Liqd Take 237 mLs by mouth 2 (two) times daily between meals. What changed:  You were already taking a medication with the same  name, and this prescription was added. Make sure you understand how and when to take each.   gabapentin 100 MG capsule Commonly known as:  NEURONTIN Take 1 capsule (100 mg total) by mouth at bedtime.   levothyroxine 75 MCG tablet Commonly known as:  SYNTHROID, LEVOTHROID Take 1 tablet (75 mcg total) by mouth daily before breakfast.   metoprolol succinate 25 MG 24 hr tablet Commonly known as:  TOPROL-XL Take 3 tablets (75 mg total) by mouth daily.   multivitamin with minerals Tabs tablet Take 1 tablet by mouth daily.   Olopatadine HCl 0.2 % Soln Place 1 drop into both eyes daily.   ondansetron 4 MG tablet Commonly known as:  ZOFRAN Take 1 tablet (4 mg total) by mouth every 8 (eight) hours as needed for nausea or vomiting.   vancomycin 50 mg/mL  oral solution Commonly known as:  VANCOCIN Take 2.5 mLs (125 mg total) by mouth 4 (four) times daily for 11 days.   vancomycin 50 mg/mL  oral solution Commonly known as:  VANCOCIN Take 2.5 mLs (125 mg total) by mouth 2 (two) times daily for 7 days. Start taking on:  October 30, 2018   vancomycin  50 mg/mL  oral solution Commonly known as:  VANCOCIN Take 2.5 mLs (125 mg total) by mouth daily for 7 days. Start taking on:  November 07, 2018   vancomycin 50 mg/mL  oral solution Commonly known as:  VANCOCIN Take 2.5 mLs (125 mg total) by mouth every other day for 7 days. Start taking on:  November 14, 2018   vancomycin 50 mg/mL  oral solution Commonly known as:  VANCOCIN Take 2.5 mLs (125 mg total) by mouth every 3 (three) days for 7 days. Start taking on:  November 22, 2018       Contact information for follow-up providers    Irine Seal, MD Follow up.   Specialty:  Urology Why:  Please call the office to make an appointment with me or one of our nurse practitioners or PA's for 4-6 weeks from discharge.  Contact information: [address] [place] [address] [phone]            Contact information for after-discharge  care    Destination    [facility] Preferred SNF .   Service:  Skilled Nursing Contact information: [place] [phone]                 Allergies  Allergen Reactions  . Infliximab Other (See Comments)    REACTION: "Enlarged intestines and hernia. Also pushed intestines on lower part of lungs." REACTION: "Enlarged intestines and hernia. Also pushed intestines on lower part of lungs."  . Aspirin Nausea And Vomiting    Low Dose is ok Stomach problems  . Carvedilol Other (See Comments)    Fatigue/ ill Fatigue/ ill  . Codeine Nausea And Vomiting    "Deathly Sick"  . Penicillins Rash    Has patient had a PCN reaction causing immediate rash, facial/tongue/throat swelling, SOB or lightheadedness with hypotension: Y Has patient had a PCN reaction causing severe rash involving mucus membranes or skin necrosis: Y Has patient had a PCN reaction that required hospitalization: N Has patient had a PCN reaction occurring within the last 10 years: N If all of the above answers are "NO", then may proceed with Cephalosporin use.     Consultations:  ID   Procedures/Studies: Ct Abdomen Pelvis W Contrast  Result Date: 10/13/2018 CLINICAL DATA:  Abdominal pain. Suspect diverticulitis. History of diverticulitis, appendectomy. EXAM: CT ABDOMEN AND PELVIS WITH CONTRAST TECHNIQUE: Multidetector CT imaging of the abdomen and pelvis was performed using the standard protocol following bolus administration of intravenous contrast. CONTRAST:  176mL OMNIPAQUE IOHEXOL 300 MG/ML  SOLN COMPARISON:  CT abdomen and pelvis September 22, 2018. FINDINGS: LOWER CHEST: Chronic small pleural effusions. Heart is upper limits of normal. Coronary artery calcifications. No pericardial effusion. HEPATOBILIARY: Asymmetric gallbladder wall thickening versus sludge with mild chronic inflammation, unchanged. No gallbladder distension. Nonacute liver. Small fatty  mass LEFT lobe of the liver is unchanged. PANCREAS: Normal. SPLEEN: 2 small cysts versus lymphangioma, otherwise unremarkable. ADRENALS/URINARY TRACT: Kidneys are orthotopic, demonstrating symmetric enhancement. LEFT upper pole scarring. No nephrolithiasis, hydronephrosis or solid renal masses. Worsening mild LEFT hydroureteronephrosis to level of the pelvis, transition point in pelvis at level of LEFT ovarian mass. Delayed imaging through the kidneys demonstrates symmetric prompt contrast excretion within the proximal urinary collecting system. Urinary bladder is partially distended and unremarkable. Normal adrenal glands. STOMACH/BOWEL: Large hiatal hernia. Severe rectosigmoid inflammatory changes with pericolonic fat stranding. Colonic air-fluid levels. Small and large bowel normal course and caliber. Moderate colonic diverticulosis.  Moderate amount of retained large bowel stool. VASCULAR/LYMPHATIC: Aortoiliac vessels are normal in course and caliber. Moderate calcific atherosclerosis. No lymphadenopathy by CT size criteria. REPRODUCTIVE: Multi cystic complex 4.8 x 5.6 cm LEFT adnexal mass. Persistent inflammatory changes in hyperemic LEFT fallopian tube with minimal gas. OTHER: Contracted pelvic phlegmon, improved. No intraperitoneal free fluid or free air. Presacral fat stranding again noted. MUSCULOSKELETAL: Nonacute.  Stable spondylosis. IMPRESSION: 1. Continued severe rectosigmoid diverticulitis/colitis. Residual pelvic phlegmon, without drainable fluid collection today. 2. Redemonstration of complex cystic LEFT ovarian mass, air tracking along the LEFT fallopian tube concerning for fistula. Recommend non emergent pelvic ultrasound for further characterization of LEFT ovarian mass. 3. Worsening mild LEFT hydroureteronephrosis obstructed at the level of the LEFT ovarian mass. 4. Cholelithiasis and image findings of chronic cholecystitis. Aortic Atherosclerosis (ICD10-I70.0). Electronically Signed   By:  Elon Alas M.D.   On: 10/13/2018 03:54   Ct Abdomen Pelvis W Contrast  Result Date: 09/22/2018 CLINICAL DATA:  Diarrhea since discharge from hospital 1 week ago. Fell this morning. History of diverticulitis, hiatal hernia, appendectomy. EXAM: CT ABDOMEN AND PELVIS WITH CONTRAST TECHNIQUE: Multidetector CT imaging of the abdomen and pelvis was performed using the standard protocol following bolus administration of intravenous contrast. CONTRAST:  143mL ISOVUE-300 IOPAMIDOL (ISOVUE-300) INJECTION 61% COMPARISON:  CT abdomen and pelvis September 07, 2018 and priors. FINDINGS: LOWER CHEST: Small similar bilateral sub pleural effusions. Included heart size is normal. No pericardial effusion. HEPATOBILIARY: Contracted gallbladder with multiple folds in similar small volume pericholecystic fluid and small layering sludge versus stones. Negative liver. PANCREAS: Normal. SPLEEN: Non suspicious. Two subcentimeter cysts versus lymphangioma is measuring to 10 mm. ADRENALS/URINARY TRACT: Kidneys are orthotopic, demonstrating symmetric enhancement. LEFT upper pole scarring. No nephrolithiasis, hydronephrosis or solid renal masses. The unopacified ureters are normal in course and caliber. Delayed imaging through the kidneys demonstrates symmetric prompt contrast excretion within the proximal urinary collecting system. Urinary bladder is partially distended and unremarkable. Normal adrenal glands. STOMACH/BOWEL: Large hiatal hernia. Severe rectosigmoid wall thickening and inflammatory changes associated with diverticular disease. Pericolonic ill-defined 4.5 x 5.1 cm fluid collection, additional ill-defined pericolonic collections. Extraluminal gas tracks to the LEFT ovarian mass. Mild descending colonic wall thickening and edema. Small and large bowel are normal in course and caliber. VASCULAR/LYMPHATIC: Aortoiliac vessels are normal in course and caliber. Moderate to severe. Calcific atherosclerosis no lymphadenopathy by  CT size criteria. REPRODUCTIVE: Uterus is displaced to the RIGHT. Multi-cystic LEFT ovarian 5.2 x 3.5 cm mass inseparable from patient's diverticulitis and abscess. OTHER: Small amount of free fluid in the pelvis. Presacral fat stranding. No intraperitoneal free air. MUSCULOSKELETAL: Nonacute. IMPRESSION: 1. Continued severe rectosigmoid perforated diverticulitis. Ill-defined multiple ill-defined pericolonic pelvic fluid collections measuring to 4.5 x 5.1 cm. Mild descending colitis. No bowel obstruction. 2. Abnormal air tracking to cystic LEFT ovarian mass, possible fistula. Recommend non emergent pelvic ultrasound for further characterization of LEFT ovarian mass. 3. Cholelithiasis and possible acute cholecystitis though, findings are similar to prior CT. 4. Similar small pleural effusions. Aortic Atherosclerosis (ICD10-I70.0). Electronically Signed   By: Elon Alas M.D.   On: 09/22/2018 19:02   Dg Chest Port 1 View  Result Date: 10/13/2018 CLINICAL DATA:  Generalized weakness, diarrhea today. Recent diverticulitis. EXAM: PORTABLE CHEST 1 VIEW COMPARISON:  Chest radiograph September 23, 2018 FINDINGS: Cardiac silhouette is mildly enlarged. Mediastinal silhouette is not suspicious, calcified aortic arch. No pleural effusion or focal consolidation. Moderate hiatal hernia. No pneumothorax. RIGHT PICC distal tip projects in distal superior vena cava.  Osteopenia. IMPRESSION: 1. Mild cardiomegaly.  No acute pulmonary process. 2. RIGHT PICC distal tip projects in distal superior vena cava. 3.  Aortic Atherosclerosis (ICD10-I70.0). Electronically Signed   By: Elon Alas M.D.   On: 10/13/2018 02:29   Dg Chest Port 1 View  Result Date: 09/23/2018 CLINICAL DATA:  PICC line placement. EXAM: PORTABLE CHEST 1 VIEW COMPARISON:  09/12/2018 FINDINGS: Right-sided PICC line terminates in the expected location of the right atrium. Cardiomediastinal silhouette is normal. Mediastinal contours appear intact. There  is no evidence of focal airspace consolidation, pleural effusion or pneumothorax. Osseous structures are without acute abnormality. Soft tissues are grossly normal. IMPRESSION: Right PICC line terminates in the expected location of the proximal right atrium. No evidence of pneumothorax. Electronically Signed   By: Fidela Salisbury M.D.   On: 09/23/2018 20:54       Subjective: Patient seen and examined the bedside this morning.  Remains hemodynamically stable.  Abdominal pain has significantly improved.  Diarrhea has slowed down.  Discharge Exam: Vitals:   10/16/18 0815 10/16/18 1226  BP: (!) 151/92 (!) 151/81  Pulse: 82 98  Resp:  15  Temp:  98.2 F (36.8 C)  SpO2:  100%   Vitals:   10/15/18 1954 10/16/18 0350 10/16/18 0815 10/16/18 1226  BP: (!) 157/87 137/87 (!) 151/92 (!) 151/81  Pulse: 89 87 82 98  Resp: 18 18  15   Temp: 98.7 F (37.1 C) 98.4 F (36.9 C)  98.2 F (36.8 C)  TempSrc: Oral Oral  Oral  SpO2: 98% 98%  100%  Weight:  53.5 kg    Height:        General: Pt is alert, awake, not in acute distress Cardiovascular: RRR, S1/S2 +, no rubs, no gallops Respiratory: CTA bilaterally, no wheezing, no rhonchi Abdominal: Soft, mildly distended, mildly tender, bowel sounds + Extremities: no edema, no cyanosis    The results of significant diagnostics from this hospitalization (including imaging, microbiology, ancillary and laboratory) are listed below for reference.     Microbiology: Recent Results (from the past 240 hour(s))  Blood Culture (routine x 2)     Status: None (Preliminary result)   Collection Time: 10/13/18  2:10 AM  Result Value Ref Range Status   Specimen Description BLOOD LEFT ANTECUBITAL  Final   Special Requests   Final    BOTTLES DRAWN AEROBIC AND ANAEROBIC Blood Culture results may not be optimal due to an inadequate volume of blood received in culture bottles   Culture   Final    NO GROWTH 3 DAYS Performed at [facility], [place]  [address], [place], [place] [address]    Report Status PENDING  Incomplete  Blood Culture (routine x 2)     Status: None (Preliminary result)   Collection Time: 10/13/18  2:28 AM  Result Value Ref Range Status   Specimen Description BLOOD LEFT HAND  Final   Special Requests   Final    BOTTLES DRAWN AEROBIC AND ANAEROBIC Blood Culture results may not be optimal due to an inadequate volume of blood received in culture bottles   Culture   Final    NO GROWTH 3 DAYS Performed at [facility], [place] [address], [place], [place] [address]    Report Status PENDING  Incomplete  C difficile quick scan w PCR reflex     Status: Abnormal   Collection Time: 10/15/18  9:24 AM  Result Value Ref Range Status   C Diff antigen POSITIVE (A)  NEGATIVE Final   C Diff toxin NEGATIVE NEGATIVE Final   C Diff interpretation Results are indeterminate. See PCR results.  Final    Comment: Performed at [facility], [place] [address], [place], [place] [address]  C. Diff by PCR, Reflexed     Status: Abnormal   Collection Time: 10/15/18  9:24 AM  Result Value Ref Range Status   Toxigenic C. Difficile by PCR POSITIVE (A) NEGATIVE Final    Comment: Positive for toxigenic C. difficile with little to no toxin production. Only treat if clinical presentation suggests symptomatic illness. Performed at [facility], [place] [address], [place], [place] [address]      Labs: BNP (last 3 results) No results for input(s): BNP in the last 8760 hours. Basic Metabolic Panel: Recent Labs  Lab 10/13/18 0205 10/14/18 0334 10/15/18 0337 10/16/18 0420  NA 134* 136 136 137  K 4.2 3.1* 3.3* 3.3*  CL 98 103 104 105  CO2 23 24 23 22   GLUCOSE 113* 99 91 86  BUN 10 10 8  5*  CREATININE 0.98 0.75 0.72 0.61  CALCIUM 8.3* 7.6* 7.6* 7.4*   Liver Function Tests: Recent Labs  Lab 10/13/18 0205  AST 24  ALT 8  ALKPHOS 61  BILITOT 0.8  PROT 6.6  ALBUMIN 2.5*   No results for input(s): LIPASE, AMYLASE in the last 168  hours. No results for input(s): AMMONIA in the last 168 hours. CBC: Recent Labs  Lab 10/13/18 0205 10/14/18 0334 10/15/18 0337 10/16/18 0420  WBC 15.7* 11.6* 14.4* 9.9  NEUTROABS 11.8*  --   --  7.1  HGB 11.0* 9.1* 9.6* 9.2*  HCT 35.8* 29.6* 31.4* 29.0*  MCV 86.9 85.5 87.0 87.3  PLT 316 273 320 332   Cardiac Enzymes: No results for input(s): CKTOTAL, CKMB, CKMBINDEX, TROPONINI in the last 168 hours. BNP: Invalid input(s): POCBNP CBG: No results for input(s): GLUCAP in the last 168 hours. D-Dimer No results for input(s): DDIMER in the last 72 hours. Hgb A1c No results for input(s): HGBA1C in the last 72 hours. Lipid Profile No results for input(s): CHOL, HDL, LDLCALC, TRIG, CHOLHDL, LDLDIRECT in the last 72 hours. Thyroid function studies No results for input(s): TSH, T4TOTAL, T3FREE, THYROIDAB in the last 72 hours.  Invalid input(s): FREET3 Anemia work up No results for input(s): VITAMINB12, FOLATE, FERRITIN, TIBC, IRON, RETICCTPCT in the last 72 hours. Urinalysis    Component Value Date/Time   COLORURINE YELLOW 10/13/2018 1504   APPEARANCEUR CLEAR 10/13/2018 1504   LABSPEC 1.043 (H) 10/13/2018 1504   PHURINE 5.0 10/13/2018 1504   GLUCOSEU NEGATIVE 10/13/2018 1504   HGBUR NEGATIVE 10/13/2018 1504   BILIRUBINUR NEGATIVE 10/13/2018 1504   KETONESUR NEGATIVE 10/13/2018 1504   PROTEINUR NEGATIVE 10/13/2018 1504   UROBILINOGEN 0.2 07/03/2014 0313   NITRITE NEGATIVE 10/13/2018 1504   LEUKOCYTESUR NEGATIVE 10/13/2018 1504   Sepsis Labs Invalid input(s): PROCALCITONIN,  WBC,  LACTICIDVEN Microbiology Recent Results (from the past 240 hour(s))  Blood Culture (routine x 2)     Status: None (Preliminary result)   Collection Time: 10/13/18  2:10 AM  Result Value Ref Range Status   Specimen Description BLOOD LEFT ANTECUBITAL  Final   Special Requests   Final    BOTTLES DRAWN AEROBIC AND ANAEROBIC Blood Culture results may not be optimal due to an inadequate volume of  blood received in culture bottles   Culture   Final    NO GROWTH 3 DAYS Performed at [facility]  Lab, [address], [place], [place] [address]    Report Status PENDING  Incomplete  Blood Culture (routine x 2)     Status: None (Preliminary result)   Collection Time: 10/13/18  2:28 AM  Result Value Ref Range Status   Specimen Description BLOOD LEFT HAND  Final   Special Requests   Final    BOTTLES DRAWN AEROBIC AND ANAEROBIC Blood Culture results may not be optimal due to an inadequate volume of blood received in culture bottles   Culture   Final    NO GROWTH 3 DAYS Performed at [facility], [place] [address], [place], [place] [address]    Report Status PENDING  Incomplete  C difficile quick scan w PCR reflex     Status: Abnormal   Collection Time: 10/15/18  9:24 AM  Result Value Ref Range Status   C Diff antigen POSITIVE (A) NEGATIVE Final   C Diff toxin NEGATIVE NEGATIVE Final   C Diff interpretation Results are indeterminate. See PCR results.  Final    Comment: Performed at [facility], [place] [address], [place], [place] [address]  C. Diff by PCR, Reflexed     Status: Abnormal   Collection Time: 10/15/18  9:24 AM  Result Value Ref Range Status   Toxigenic C. Difficile by PCR POSITIVE (A) NEGATIVE Final    Comment: Positive for toxigenic C. difficile with little to no toxin production. Only treat if clinical presentation suggests symptomatic illness. Performed at [facility], [place] [address], [place], [place] [address]     Please note: You were cared for by a hospitalist during your hospital stay. Once you are discharged, your primary care physician will handle any further medical issues. Please note that NO REFILLS for any discharge medications will be authorized once you are discharged, as it is imperative that you return to your primary care physician (or establish a relationship with a primary care physician if you do not have one) for your post hospital  discharge needs so that they can reassess your need for medications and monitor your lab values.    Time coordinating discharge: 40 minutes  SIGNED:   Shelly Coss, MD  [facility] 10/16/2018, 1:27 PM Pager [phone]  If 7PM-7AM, please contact night-coverage [email or web address] Password [email or web address]

## 2018-10-16 NOTE — Clinical Social Work Note (Addendum)
Patient's son called RN yesterday requesting bed offers. CSW gave RN list to read off to him. She stated patient's son is requesting [facility]. They had declined due to open liability claim. CSW called and asked admissions coordinator to reconsider denial. She stated that they have been trying to get her admitted from home for two weeks and have been unable to obtain needed information to close liability claim.  Dayton Scrape, [place] [phone]  12:39 pm Patient has chosen [facility] and they can take her today. Sent MD a message to notify.  Dayton Scrape, [place]

## 2018-10-16 NOTE — Plan of Care (Signed)
  Problem: Education: Goal: Knowledge of General Education information will improve Description Including pain rating scale, medication(s)/side effects and non-pharmacologic comfort measures Outcome: Progressing   Problem: Health Behavior/Discharge Planning: Goal: Ability to manage health-related needs will improve Outcome: Progressing   

## 2018-10-16 NOTE — Consult Note (Signed)
[facility]    Date of Admission:  10/13/2018     Total days of antibiotics 5               Reason for Consult: Diverticulitis / C. Diff  Referring Provider: Tawanna Solo Primary Care Provider: [name], NP   Assessment/Plan:  [name] is an 84 y/o female who is currently being treated for diverticulitis with Invanz until 12/23 who has returned to the hospital C. Diff PCR positive and toxin negative C. Diff colitis. She unfortunately was not able to get the oral vancomycin at home. She is tolerating the Invanz and has no current abdominal pain or cramping despite CT scan showing continued severe rectosigmoid diverticulitis/colitis with residual phlegmon without drainable collection. She is tolerating the oral vancomycin and has stable electrolytes. Currently having close to a dozen bowel movements per day but improving. Question if the diarrhea is multifactorial and be from diverticulitis as well given toxin negative PCR positive C. Diff. She has been afebrile for the last 48 hours. Disposition remains to be determined.  1. Continue current dose of Invanz and oral vancomycin. 2. Continue enteric precautions. 3. Monitor fevers and symptoms of diarrhea.  Active Problems:   HLD (hyperlipidemia)   Hypothyroidism   Atrial fibrillation, chronic   Essential hypertension   CKD (chronic kidney disease) stage 2, GFR 60-89 ml/min   A-fib (HCC)   Acute diverticulitis   Clostridium difficile colitis   Hydronephrosis   Ovarian mass   C. difficile colitis   . aspirin  81 mg Oral Daily  . feeding supplement (ENSURE ENLIVE)  237 mL Oral BID BM  . gabapentin  100 mg Oral QHS  . Gerhardt's butt cream   Topical QID  . levothyroxine  75 mcg Oral QAC breakfast  . metoprolol succinate  75 mg Oral Daily  . multivitamin with minerals  1 tablet Oral Daily  . sodium chloride flush  10-40 mL Intracatheter Q12H  . vancomycin  125 mg Oral QID     HPI: [name] is a 84 y.o. female with previous medical history as detailed below and significant for continued treatment of severe diverticulitis with Invanz from her most recent hospitalization from 11/24-11/28. She has returned to the hospital with increasing weakness and diarrhea. She was previously started on oral vancomycin for C. Difficile and not able to obtain the medications while at home living with her son.  In the ED EMS reported a temperature of 102 with ED vitals having rectal temperature of 97.7. Other vital signs were stable. WBC count was noted to be 15.7. CT of the abdomen and pelvis with continued severe rectosigmoid diverticulitis/colitis. Chest x-ray with good placement of the right side PICC line. She was continued on oral vancomycin and ertapenem.   [name] has been afebrile for the last 48 hours and has improved WBC count with most recent of 9.9. Denies any current abdominal pain, but does continue to have diarrhea that ranges in textures and numbered around a dozen per day.     Review of Systems: Review of Systems  Constitutional: Negative for chills, fever and malaise/fatigue.  Respiratory: Negative for cough, shortness of breath and wheezing.   Cardiovascular: Negative for chest pain.  Gastrointestinal: Positive for diarrhea. Negative for abdominal pain, constipation, nausea and vomiting.  Neurological: Negative for weakness and headaches.     Past Medical History:  Diagnosis Date  . A-fib ([place])   . Aortic stenosis,  moderate    last echo in 2010; AVA 1.1; mild AS per echo in June 2013  . Edema of both legs    s/p laser treatment per Dr. Donnetta Hutching  . Hiatal hernia   . History of chicken pox   . HLD (hyperlipidemia)   . HTN (hypertension)   . Hypothyroidism   . IBS (irritable bowel syndrome)   . Iron deficiency anemia   . Neuropathic pain 09/06/2015  . Rheumatoid arthritis ([place])     Social History   Tobacco Use  . Smoking status: Former Smoker    Types:  Cigarettes    Last attempt to quit: 10/31/1983    Years since quitting: 34.9  . Smokeless tobacco: Never Used  Substance Use Topics  . Alcohol use: No  . Drug use: No    Family History  Problem Relation Age of Onset  . Heart disease Mother   . Peripheral vascular disease Mother   . Heart disease Father   . Peripheral vascular disease Father        Right leg amputation  . COPD Father   . Heart disease Brother 22       Heart Disease before age 43  . Heart attack Brother   . Peripheral vascular disease Other   . Hypertension Sister     Allergies  Allergen Reactions  . Infliximab Other (See Comments)    REACTION: "Enlarged intestines and hernia. Also pushed intestines on lower part of lungs." REACTION: "Enlarged intestines and hernia. Also pushed intestines on lower part of lungs."  . Aspirin Nausea And Vomiting    Low Dose is ok Stomach problems  . Carvedilol Other (See Comments)    Fatigue/ ill Fatigue/ ill  . Codeine Nausea And Vomiting    "Deathly Sick"  . Penicillins Rash    Has patient had a PCN reaction causing immediate rash, facial/tongue/throat swelling, SOB or lightheadedness with hypotension: Y Has patient had a PCN reaction causing severe rash involving mucus membranes or skin necrosis: Y Has patient had a PCN reaction that required hospitalization: N Has patient had a PCN reaction occurring within the last 10 years: N If all of the above answers are "NO", then may proceed with Cephalosporin use.     OBJECTIVE: Blood pressure (!) 151/92, pulse 82, temperature 98.4 F (36.9 C), temperature source Oral, resp. rate 18, height 5' (1.524 m), weight 53.5 kg, SpO2 98 %.  Physical Exam Constitutional:      General: She is not in acute distress.    Appearance: She is well-developed. She is ill-appearing. She is not diaphoretic.     Comments: Seated in the chair; pleasant.   Cardiovascular:     Rate and Rhythm: Normal rate and regular rhythm.     Heart sounds:  Normal heart sounds. No murmur. No gallop.   Pulmonary:     Effort: Pulmonary effort is normal.     Breath sounds: Normal breath sounds.  Abdominal:     General: There is distension.     Palpations: There is no mass.     Tenderness: There is no abdominal tenderness. There is no guarding or rebound.  Skin:    General: Skin is warm and dry.  Neurological:     Mental Status: She is alert.     Lab Results Lab Results  Component Value Date   WBC 9.9 10/16/2018   HGB 9.2 (L) 10/16/2018   HCT 29.0 (L) 10/16/2018   MCV 87.3 10/16/2018   PLT 332 10/16/2018  Lab Results  Component Value Date   CREATININE 0.61 10/16/2018   BUN 5 (L) 10/16/2018   NA 137 10/16/2018   K 3.3 (L) 10/16/2018   CL 105 10/16/2018   CO2 22 10/16/2018    Lab Results  Component Value Date   ALT 8 10/13/2018   AST 24 10/13/2018   ALKPHOS 61 10/13/2018   BILITOT 0.8 10/13/2018     Microbiology: Recent Results (from the past 240 hour(s))  Blood Culture (routine x 2)     Status: None (Preliminary result)   Collection Time: 10/13/18  2:10 AM  Result Value Ref Range Status   Specimen Description BLOOD LEFT ANTECUBITAL  Final   Special Requests   Final    BOTTLES DRAWN AEROBIC AND ANAEROBIC Blood Culture results may not be optimal due to an inadequate volume of blood received in culture bottles   Culture   Final    NO GROWTH 3 DAYS Performed at [facility], [place] [address], [place], [place] [address]    Report Status PENDING  Incomplete  Blood Culture (routine x 2)     Status: None (Preliminary result)   Collection Time: 10/13/18  2:28 AM  Result Value Ref Range Status   Specimen Description BLOOD LEFT HAND  Final   Special Requests   Final    BOTTLES DRAWN AEROBIC AND ANAEROBIC Blood Culture results may not be optimal due to an inadequate volume of blood received in culture bottles   Culture   Final    NO GROWTH 3 DAYS Performed at [facility], [place] [address], [place], [place]  [id number]    Report Status PENDING  Incomplete  C difficile quick scan w PCR reflex     Status: Abnormal   Collection Time: 10/15/18  9:24 AM  Result Value Ref Range Status   C Diff antigen POSITIVE (A) NEGATIVE Final   C Diff toxin NEGATIVE NEGATIVE Final   C Diff interpretation Results are indeterminate. See PCR results.  Final    Comment: Performed at [facility], [place] [address], [place], [place] [address]  C. Diff by PCR, Reflexed     Status: Abnormal   Collection Time: 10/15/18  9:24 AM  Result Value Ref Range Status   Toxigenic C. Difficile by PCR POSITIVE (A) NEGATIVE Final    Comment: Positive for toxigenic C. difficile with little to no toxin production. Only treat if clinical presentation suggests symptomatic illness. Performed at [facility], [place] [address] [place], [place] [address]      Terri Piedra, [place] for [place] Pager  10/16/2018  11:42 AM

## 2018-10-16 NOTE — Progress Notes (Signed)
Patient states she's not eating much because she doesn't like her diet (clear liquid), MD paged- soft diet ordered.

## 2018-10-17 ENCOUNTER — Non-Acute Institutional Stay (SKILLED_NURSING_FACILITY): Payer: Medicare Other | Admitting: Internal Medicine

## 2018-10-17 ENCOUNTER — Encounter: Payer: Self-pay | Admitting: Internal Medicine

## 2018-10-17 DIAGNOSIS — N1330 Unspecified hydronephrosis: Secondary | ICD-10-CM

## 2018-10-17 DIAGNOSIS — I10 Essential (primary) hypertension: Secondary | ICD-10-CM | POA: Diagnosis not present

## 2018-10-17 DIAGNOSIS — N83202 Unspecified ovarian cyst, left side: Secondary | ICD-10-CM

## 2018-10-17 DIAGNOSIS — I482 Chronic atrial fibrillation, unspecified: Secondary | ICD-10-CM

## 2018-10-17 DIAGNOSIS — E876 Hypokalemia: Secondary | ICD-10-CM

## 2018-10-17 DIAGNOSIS — M0579 Rheumatoid arthritis with rheumatoid factor of multiple sites without organ or systems involvement: Secondary | ICD-10-CM

## 2018-10-17 DIAGNOSIS — N838 Other noninflammatory disorders of ovary, fallopian tube and broad ligament: Secondary | ICD-10-CM

## 2018-10-17 DIAGNOSIS — A0472 Enterocolitis due to Clostridium difficile, not specified as recurrent: Secondary | ICD-10-CM

## 2018-10-17 DIAGNOSIS — K5792 Diverticulitis of intestine, part unspecified, without perforation or abscess without bleeding: Secondary | ICD-10-CM

## 2018-10-17 DIAGNOSIS — E039 Hypothyroidism, unspecified: Secondary | ICD-10-CM | POA: Diagnosis not present

## 2018-10-17 NOTE — Progress Notes (Signed)
:  Location:  [facility] Room Number: 846N Place of Service:  SNF (31)  Brodi Nery D. Sheppard Coil, MD  Patient Care Team: Nche, Charlene Brooke, NP as PCP - General (Internal Medicine) Nahser, Wonda Cheng, MD (Cardiology) Gavin Pound, MD as Consulting Physician (Rheumatology) Tommy Medal, Lavell Islam, MD as Consulting Physician (Infectious Diseases) Bo Merino, MD as Consulting Physician (Rheumatology)  Extended Emergency Contact Information Primary Emergency Contact: Teodora Medici [address] [place] Phone: [phone] Mobile Phone: [phone] Relation: Son Secondary Emergency Contact: Berneda [place] Phone: [phone] Mobile Phone: [phone] Relation: Sister     Allergies: Infliximab; Aspirin; Carvedilol; Codeine; and Penicillins  Chief Complaint  Patient presents with  . New Admit To SNF    Admit to [facility]    HPI: Patient is 84 y.o. female past medical history of recurrent perforated diverticulitis, C. difficile with previous hospitalization who was admitted to [facility] from 12/15-18 for recurrent C. difficile acute diverticulitis and worsening left hydroureter nephrosis.  She had been recently discharged 2 weeks prior but was unable to get her p.o. vancomycin filled and during that time her diarrhea gotten worse.  She was not felt to be a surgical candidate given history of aortic stenosis but she also adamantly does not want surgery.  In addition she was found to have a left ovarian mass which is been there for years with plans for outpatient OB/GYN follow-up.  Patient was started on IV Invanz and oral vancomycin.  She is discharged to skilled nursing facility for 1 more month of Invanz for treatment of severe diverticulitis and also vancomycin pulse therapy for C. difficile.  She is at high risk for recurrence.  Patient does not wish to discuss32 with  palliative care.  While at skilled nursing facility patient will be followed for chronic atrial fib treated with metoprolol, not on anticoagulation, hypertension treated with Toprol, and hypothyroidism treated with replacement.  Past Medical History:  Diagnosis Date  . A-fib ([place])   . Aortic stenosis, moderate    last echo in 2010; AVA 1.1; mild AS per echo in June 2013  . Edema of both legs    s/p laser treatment per Dr. Donnetta Hutching  . Hiatal hernia   . History of chicken pox   . HLD (hyperlipidemia)   . HTN (hypertension)   . Hypothyroidism   . IBS (irritable bowel syndrome)   . Iron deficiency anemia   . Neuropathic pain 09/06/2015  . Rheumatoid arthritis [facility])     Past Surgical History:  Procedure Laterality Date  . APPENDECTOMY  1955  . CARPAL TUNNEL RELEASE Right   . CATARACT EXTRACTION    . ENDOSCOPIC VEIN LASER TREATMENT    . ENDOVENOUS ABLATION SAPHENOUS VEIN W/ LASER  08-29-2012   left greater saphenous vein   Sherren Mocha Early MD  . ENDOVENOUS ABLATION SAPHENOUS VEIN W/ LASER  09-19-2012   right greater saphenous vein by Curt Jews MD  . EYE SURGERY  2005   Bilateral cataract  . FOOT SURGERY  2003   bilateral Hammer Toe  . stab phlebectomy Right 01-02-2013   10-15 incisions right thigh and calf by Curt Jews MD    Allergies as of 10/17/2018      Reactions   Infliximab Other (See Comments)   REACTION: "Enlarged intestines and hernia. Also pushed intestines on lower part of lungs." REACTION: "Enlarged intestines and hernia.  Also pushed intestines on lower part of lungs."   Aspirin Nausea And Vomiting   Low Dose is ok Stomach problems   Carvedilol Other (See Comments)   Fatigue/ ill Fatigue/ ill   Codeine Nausea And Vomiting   "Deathly Sick"   Penicillins Rash   Has patient had a PCN reaction causing immediate rash, facial/tongue/throat swelling, SOB or lightheadedness with hypotension: Y Has patient had a PCN reaction causing severe rash involving mucus membranes or  skin necrosis: Y Has patient had a PCN reaction that required hospitalization: N Has patient had a PCN reaction occurring within the last 10 years: N If all of the above answers are "NO", then may proceed with Cephalosporin use.      Medication List       Accurate as of October 17, 2018  8:55 AM. Always use your most recent med list.        acetaminophen 500 MG tablet Commonly known as:  TYLENOL Take 1,000 mg by mouth every 6 (six) hours as needed (joint pain).   ALPRAZolam 0.25 MG tablet Commonly known as:  XANAX Take 1 tablet (0.25 mg total) by mouth at bedtime as needed for anxiety.   aspirin 81 MG chewable tablet Chew 81 mg by mouth daily.   ertapenem  IVPB Commonly known as:  INVANZ Inject 1 g into the vein daily for 27 days. Indication: Diverticulitis  Last Day of Therapy:  11/12/18 Labs - Once weekly:  CBC/D and BMP, Labs - Every other week:  ESR and CRP   feeding supplement (ENSURE ENLIVE) Liqd Take 237 mLs by mouth 2 (two) times daily between meals.   feeding supplement (ENSURE ENLIVE) Liqd Take 237 mLs by mouth 2 (two) times daily between meals.   gabapentin 100 MG capsule Commonly known as:  NEURONTIN Take 1 capsule (100 mg total) by mouth at bedtime.   levothyroxine 75 MCG tablet Commonly known as:  SYNTHROID, LEVOTHROID Take 1 tablet (75 mcg total) by mouth daily before breakfast.   metoprolol succinate 25 MG 24 hr tablet Commonly known as:  TOPROL-XL Take 3 tablets (75 mg total) by mouth daily.   multivitamin with minerals Tabs tablet Take 1 tablet by mouth daily.   Olopatadine HCl 0.2 % Soln Place 1 drop into both eyes daily.   ondansetron 4 MG tablet Commonly known as:  ZOFRAN Take 1 tablet (4 mg total) by mouth every 8 (eight) hours as needed for nausea or vomiting.   vancomycin 50 mg/mL  oral solution Commonly known as:  VANCOCIN Take 2.5 mLs (125 mg total) by mouth 4 (four) times daily for 11 days.   vancomycin 50 mg/mL  oral  solution Commonly known as:  VANCOCIN Take 2.5 mLs (125 mg total) by mouth 2 (two) times daily for 7 days. Start taking on:  October 30, 2018   vancomycin 50 mg/mL  oral solution Commonly known as:  VANCOCIN Take 2.5 mLs (125 mg total) by mouth daily for 7 days. Start taking on:  November 07, 2018   vancomycin 50 mg/mL  oral solution Commonly known as:  VANCOCIN Take 2.5 mLs (125 mg total) by mouth every other day for 7 days. Start taking on:  November 14, 2018   vancomycin 50 mg/mL  oral solution Commonly known as:  VANCOCIN Take 2.5 mLs (125 mg total) by mouth every 3 (three) days for 7 days. Start taking on:  November 22, 2018       No orders of the defined types were  placed in this encounter.   Immunization History  Administered Date(s) Administered  . Influenza, High Dose Seasonal PF 09/17/2017, 08/09/2018  . Influenza,inj,Quad PF,6+ Mos 09/06/2015  . Influenza,trivalent, recombinat, inj, PF 08/01/2011, 06/21/2012  . Influenza-Unspecified 08/01/2011, 06/21/2012, 09/06/2015  . PPD Test 12/04/2011  . Pneumococcal Conjugate-13 08/09/2018  . Pneumococcal Polysaccharide-23 07/04/2011  . Tdap 07/04/2011, 08/17/2014    Social History   Tobacco Use  . Smoking status: Former Smoker    Types: Cigarettes    Last attempt to quit: 10/31/1983    Years since quitting: 34.9  . Smokeless tobacco: Never Used  Substance Use Topics  . Alcohol use: No    Family history is   Family History  Problem Relation Age of Onset  . Heart disease Mother   . Peripheral vascular disease Mother   . Heart disease Father   . Peripheral vascular disease Father        Right leg amputation  . COPD Father   . Heart disease Brother 38       Heart Disease before age 57  . Heart attack Brother   . Peripheral vascular disease Other   . Hypertension Sister       Review of Systems  DATA OBTAINED: from patient, nurse GENERAL:  no fevers, fatigue, appetite changes SKIN: No itching, or  rash EYES: No eye pain, redness, discharge EARS: No earache, tinnitus, change in hearing NOSE: No congestion, drainage or bleeding  MOUTH/THROAT: No mouth or tooth pain, No sore throat RESPIRATORY: No cough, wheezing, SOB CARDIAC: No chest pain, palpitations, lower extremity edema  GI: No abdominal pain, No N/V/D or constipation, No heartburn or reflux  GU: No dysuria, frequency or urgency, or incontinence  MUSCULOSKELETAL: No unrelieved bone/joint pain NEUROLOGIC: No headache, dizziness or focal weakness PSYCHIATRIC: No c/o anxiety or sadness   Vitals:   10/17/18 0853  BP: 132/78  Pulse: 86  Resp: 18  Temp: 97.8 F (36.6 C)    SpO2 Readings from Last 1 Encounters:  10/16/18 100%   Body mass index is 23.05 kg/m.     Physical Exam  GENERAL APPEARANCE: Alert, conversant,  No acute distress.  SKIN: No diaphoresis rash HEAD: Normocephalic, atraumatic  EYES: Conjunctiva/lids clear. Pupils round, reactive. EOMs intact.  EARS: External exam WNL, canals clear. Hearing grossly normal.  NOSE: No deformity or discharge.  MOUTH/THROAT: Lips w/o lesions  RESPIRATORY: Breathing is even, unlabored. Lung sounds are clear   CARDIOVASCULAR: Heart irregular, 2/6 murmur, no rubs or gallops.  Trace peripheral edema.   GASTROINTESTINAL: Abdomen is soft, non-tender, not distended w/ normal bowel sounds. GENITOURINARY: Bladder non tender, not distended  MUSCULOSKELETAL: No abnormal joints or musculature NEUROLOGIC:  Cranial nerves 2-12 grossly intact. Moves all extremities  PSYCHIATRIC: Mood and affect appropriate to situation, no behavioral issues  Patient Active Problem List   Diagnosis Date Noted  . Goals of care, counseling/discussion   . Hydronephrosis 10/13/2018  . Ovarian mass 10/13/2018  . C. difficile colitis 10/13/2018  . Clostridium difficile colitis 09/24/2018  . Hypokalemia   . Hypomagnesemia   . Left ovarian cyst   . Acute diverticulitis 09/07/2018  . Atrial  fibrillation with rapid ventricular response ([place]) 09/06/2018  . Leucocytosis 09/06/2018  . A-fib ([place]) 09/06/2018  . Malnutrition of moderate degree 07/26/2018  . Perforation of sigmoid colon due to diverticulitis 07/25/2018  . Thrombocytosis ([place]) 07/25/2018  . Sepsis ([place]) 07/24/2018  . Anxiety about health 07/03/2018  . Generalized abdominal pain 02/06/2018  . Paresthesia of both  lower extremities 02/06/2018  . CKD (chronic kidney disease) stage 2, GFR 60-89 ml/min 01/07/2018  . Normocytic normochromic anemia 01/07/2018  . Hyperglycemia 01/07/2018  . Pyoderma gangrenosum 01/04/2017  . Primary osteoarthritis of both hands 01/04/2017  . Primary osteoarthritis of both feet 01/04/2017  . Primary osteoarthritis of both knees 01/04/2017  . Degenerative joint disease involving multiple joints 01/04/2017  . Neuropathic pain 09/06/2015  . Erythema nodosum 09/21/2014  . Rheumatoid arthritis ([place]) 09/21/2014  . Essential hypertension 09/02/2014  . Rectal bleeding 05/15/2014  . Atrial fibrillation, chronic 05/11/2014  . HLD (hyperlipidemia) 04/02/2014  . Hypothyroidism 04/02/2014  . PAC (premature atrial contraction) 01/23/2014  . Atrial tachycardia, paroxysmal ([place]) 01/23/2014  . Near syncope 01/22/2014  . Varicose veins of lower extremities with other complications 32/20/2542  . Venous insufficiency of both lower extremities 04/19/2012  . Leg edema 01/17/2012  . Aortic stenosis 03/22/2011      Labs reviewed: Basic Metabolic Panel:    Component Value Date/Time   NA 137 10/16/2018 0420   NA 137 10/09/2018   K 3.3 (L) 10/16/2018 0420   CL 105 10/16/2018 0420   CO2 22 10/16/2018 0420   GLUCOSE 86 10/16/2018 0420   BUN 5 (L) 10/16/2018 0420   BUN 11 10/09/2018   CREATININE 0.61 10/16/2018 0420   CREATININE 0.92 (H) 08/09/2018 1511   CALCIUM 7.4 (L) 10/16/2018 0420   CALCIUM 8.6 10/09/2018   PROT 6.6 10/13/2018 0205   ALBUMIN 2.5 (L) 10/13/2018 0205   AST 24 10/13/2018 0205    ALT 8 10/13/2018 0205   ALKPHOS 61 10/13/2018 0205   BILITOT 0.8 10/13/2018 0205   GFRNONAA >60 10/16/2018 0420   GFRAA >60 10/16/2018 0420    Recent Labs    09/24/18 0422 09/25/18 0914 09/26/18 0229  10/14/18 0334 10/15/18 0337 10/16/18 0420  NA 133* 132* 135   < > 136 136 137  K 3.7 4.0 4.3   < > 3.1* 3.3* 3.3*  CL 104 103 103   < > 103 104 105  CO2 _0 < > _1 GLUCOSE 107* 108* 94   < > 99 91 86  BUN 11 5* <5*   < > 10 8 5*  CREATININE 0.60 0.62 0.60   < > 0.75 0.72 0.61  CALCIUM 7.4* 7.6* 7.8*   < > 7.6* 7.6* 7.4*  MG 2.3 1.6* 2.0  --   --   --   --   PHOS  --  2.0* 3.2  --   --   --   --    < > = values in this interval not displayed.   Liver Function Tests: Recent Labs    09/25/18 0914 09/26/18 0229 10/13/18 0205  AST 14* 14* 24  ALT _2 ALKPHOS 50 49 61  BILITOT 0.5 0.5 0.8  PROT 5.9* 5.7* 6.6  ALBUMIN 2.5* 2.5* 2.5*   Recent Labs    07/24/18 1656  LIPASE 45   No results for input(s): AMMONIA in the last 8760 hours. CBC: Recent Labs    10/09/18 10/13/18 0205 10/14/18 0334 10/15/18 0337 10/16/18 0420  WBC 8.2 15.7* 11.6* 14.4* 9.9  NEUTROABS 5 11.8*  --   --  7.1  HGB 10.7* 11.0* 9.1* 9.6* 9.2*  HCT 32* 35.8* 29.6* 31.4* 29.0*  MCV  --  86.9 85.5 87.0 87.3  PLT 405* 316 273 320 332   Lipid No results for input(s): CHOL, HDL, LDLCALC, TRIG  in the last 8760 hours.  Cardiac Enzymes: No results for input(s): CKTOTAL, CKMB, CKMBINDEX, TROPONINI in the last 8760 hours. BNP: No results for input(s): BNP in the last 8760 hours. No results found for: [facility] Lab Results  Component Value Date   HGBA1C 6.0 01/07/2018   Lab Results  Component Value Date   TSH 1.732 09/06/2018   Lab Results  Component Value Date   VITAMINB12 330 01/07/2018   Lab Results  Component Value Date   FOLATE 9.0 01/07/2018   Lab Results  Component Value Date   IRON 28 (L) 01/07/2018   TIBC 276 04/02/2014   FERRITIN 72 04/02/2014     Imaging and Procedures obtained prior to SNF admission: Ct Abdomen Pelvis W Contrast  Result Date: 10/13/2018 CLINICAL DATA:  Abdominal pain. Suspect diverticulitis. History of diverticulitis, appendectomy. EXAM: CT ABDOMEN AND PELVIS WITH CONTRAST TECHNIQUE: Multidetector CT imaging of the abdomen and pelvis was performed using the standard protocol following bolus administration of intravenous contrast. CONTRAST:  144m OMNIPAQUE IOHEXOL 300 MG/ML  SOLN COMPARISON:  CT abdomen and pelvis September 22, 2018. FINDINGS: LOWER CHEST: Chronic small pleural effusions. Heart is upper limits of normal. Coronary artery calcifications. No pericardial effusion. HEPATOBILIARY: Asymmetric gallbladder wall thickening versus sludge with mild chronic inflammation, unchanged. No gallbladder distension. Nonacute liver. Small fatty mass LEFT lobe of the liver is unchanged. PANCREAS: Normal. SPLEEN: 2 small cysts versus lymphangioma, otherwise unremarkable. ADRENALS/URINARY TRACT: Kidneys are orthotopic, demonstrating symmetric enhancement. LEFT upper pole scarring. No nephrolithiasis, hydronephrosis or solid renal masses. Worsening mild LEFT hydroureteronephrosis to level of the pelvis, transition point in pelvis at level of LEFT ovarian mass. Delayed imaging through the kidneys demonstrates symmetric prompt contrast excretion within the proximal urinary collecting system. Urinary bladder is partially distended and unremarkable. Normal adrenal glands. STOMACH/BOWEL: Large hiatal hernia. Severe rectosigmoid inflammatory changes with pericolonic fat stranding. Colonic air-fluid levels. Small and large bowel normal course and caliber. Moderate colonic diverticulosis. Moderate amount of retained large bowel stool. VASCULAR/LYMPHATIC: Aortoiliac vessels are normal in course and caliber. Moderate calcific atherosclerosis. No lymphadenopathy by CT size criteria. REPRODUCTIVE: Multi cystic complex 4.8 x 5.6 cm LEFT adnexal mass.  Persistent inflammatory changes in hyperemic LEFT fallopian tube with minimal gas. OTHER: Contracted pelvic phlegmon, improved. No intraperitoneal free fluid or free air. Presacral fat stranding again noted. MUSCULOSKELETAL: Nonacute.  Stable spondylosis. IMPRESSION: 1. Continued severe rectosigmoid diverticulitis/colitis. Residual pelvic phlegmon, without drainable fluid collection today. 2. Redemonstration of complex cystic LEFT ovarian mass, air tracking along the LEFT fallopian tube concerning for fistula. Recommend non emergent pelvic ultrasound for further characterization of LEFT ovarian mass. 3. Worsening mild LEFT hydroureteronephrosis obstructed at the level of the LEFT ovarian mass. 4. Cholelithiasis and image findings of chronic cholecystitis. Aortic Atherosclerosis (ICD10-I70.0). Electronically Signed   By: CElon AlasM.D.   On: 10/13/2018 03:54   Dg Chest Port 1 View  Result Date: 10/13/2018 CLINICAL DATA:  Generalized weakness, diarrhea today. Recent diverticulitis. EXAM: PORTABLE CHEST 1 VIEW COMPARISON:  Chest radiograph September 23, 2018 FINDINGS: Cardiac silhouette is mildly enlarged. Mediastinal silhouette is not suspicious, calcified aortic arch. No pleural effusion or focal consolidation. Moderate hiatal hernia. No pneumothorax. RIGHT PICC distal tip projects in distal superior vena cava. Osteopenia. IMPRESSION: 1. Mild cardiomegaly.  No acute pulmonary process. 2. RIGHT PICC distal tip projects in distal superior vena cava. 3.  Aortic Atherosclerosis (ICD10-I70.0). Electronically Signed   By: CElon AlasM.D.   On: 10/13/2018 02:29     Not  all labs, radiology exams or other studies done during hospitalization come through on my EPIC note; however they are reviewed by me.    Assessment and Plan  Recurrent C. difficile/acute diverticulitis- treated with tapering p.o. vancomycin with improvement in the frequency of bowel movements and Invanz IV; imagings on  presentation showed continued severe rectosigmoid diverticulitis/colitis with residual pelvic phlegmon without drainable fluid collection.  Patient does not desire surgery.  After discussion ID plan is to continue Invanz and pulse p.o. vancomycin for 1 more month SNF-admitted for OT/PT and 1 more month of IV Invanz in the continuation of pulse p.o. vancomycin until completion  Chronic atrial fibrillation SNF stable continue Toprol-XL 75 mg daily, patient is not on anticoagulation  Hypertension SNF- controlled on Toprol-XL 75 mg daily; continue current therapy  Hypothyroidism SNF- not stated as uncontrolled; continue replacement with 75 mcg daily  Hypokalemia- replaced SNF- follow-up BMP  Hydronephrosis/ovarian mass-chronic left ovarian mass for decades; plans to follow-up with OB/GYN as outpatient imaging showed left-sided hydronephrosis who will also be followed up as outpatient   Spent greater than 45 minutes;> 50% of time with patient was spent reviewing records, labs, tests and studies, counseling and developing plan of care  Webb Silversmith D. Sheppard Coil, MD

## 2018-10-18 LAB — CULTURE, BLOOD (ROUTINE X 2)
Culture: NO GROWTH
Culture: NO GROWTH

## 2018-10-19 ENCOUNTER — Encounter: Payer: Self-pay | Admitting: Internal Medicine

## 2018-10-21 LAB — CBC AND DIFFERENTIAL
HCT: 31 — AB (ref 36–46)
Hemoglobin: 10.2 — AB (ref 12.0–16.0)
Platelets: 454 — AB (ref 150–399)
WBC: 7.7

## 2018-10-21 LAB — BASIC METABOLIC PANEL
BUN: 7 (ref 4–21)
Creatinine: 0.6 (ref 0.5–1.1)
Glucose: 93
Potassium: 3.2 — AB (ref 3.4–5.3)
Sodium: 140 (ref 137–147)

## 2018-10-25 ENCOUNTER — Non-Acute Institutional Stay (SKILLED_NURSING_FACILITY): Payer: Medicare Other | Admitting: Internal Medicine

## 2018-10-25 ENCOUNTER — Encounter: Payer: Self-pay | Admitting: Internal Medicine

## 2018-10-25 DIAGNOSIS — N838 Other noninflammatory disorders of ovary, fallopian tube and broad ligament: Secondary | ICD-10-CM

## 2018-10-25 DIAGNOSIS — I482 Chronic atrial fibrillation, unspecified: Secondary | ICD-10-CM

## 2018-10-25 DIAGNOSIS — K5792 Diverticulitis of intestine, part unspecified, without perforation or abscess without bleeding: Secondary | ICD-10-CM | POA: Diagnosis not present

## 2018-10-25 DIAGNOSIS — N1330 Unspecified hydronephrosis: Secondary | ICD-10-CM

## 2018-10-25 DIAGNOSIS — E876 Hypokalemia: Secondary | ICD-10-CM

## 2018-10-25 DIAGNOSIS — E039 Hypothyroidism, unspecified: Secondary | ICD-10-CM | POA: Diagnosis not present

## 2018-10-25 DIAGNOSIS — M792 Neuralgia and neuritis, unspecified: Secondary | ICD-10-CM

## 2018-10-25 DIAGNOSIS — I10 Essential (primary) hypertension: Secondary | ICD-10-CM

## 2018-10-25 DIAGNOSIS — A0472 Enterocolitis due to Clostridium difficile, not specified as recurrent: Secondary | ICD-10-CM

## 2018-10-25 NOTE — Progress Notes (Signed)
Location:  [facility] Room Number: 110P Place of Service:  SNF (31)  Noah Delaine. Sheppard Coil, MD  Patient Care Team: Nche, Charlene Brooke, NP as PCP - General (Internal Medicine) Nahser, Wonda Cheng, MD (Cardiology) Gavin Pound, MD as Consulting Physician (Rheumatology) Tommy Medal, Lavell Islam, MD as Consulting Physician (Infectious Diseases) Bo Merino, MD as Consulting Physician (Rheumatology)  Extended Emergency Contact Information Primary Emergency Contact: Teodora Medici [address] of [place] Phone: [phone] Mobile Phone: [phone] Relation: Son Secondary Emergency Contact: Berneda Rose [place] of [place] Phone: [phone] Mobile Phone: [phone] Relation: Sister  Allergies  Allergen Reactions  . Infliximab Other (See Comments)    REACTION: "Enlarged intestines and hernia. Also pushed intestines on lower part of lungs." REACTION: "Enlarged intestines and hernia. Also pushed intestines on lower part of lungs."  . Aspirin Nausea And Vomiting    Low Dose is ok Stomach problems  . Carvedilol Other (See Comments)    Fatigue/ ill Fatigue/ ill  . Codeine Nausea And Vomiting    "Deathly Sick"  . Penicillins Rash    Has patient had a PCN reaction causing immediate rash, facial/tongue/throat swelling, SOB or lightheadedness with hypotension: Y Has patient had a PCN reaction causing severe rash involving mucus membranes or skin necrosis: Y Has patient had a PCN reaction that required hospitalization: N Has patient had a PCN reaction occurring within the last 10 years: N If all of the above answers are "NO", then may proceed with Cephalosporin use.     Chief Complaint  Patient presents with  . Discharge Note    Discharge from [facility]    HPI:  84 y.o. female with past medical history of recurrent perforated diverticulitis, C. difficile with previous hospitalization who was  admitted to [facility] from 12/15-18 for recurrent C. difficile, acute diverticulitis and worsening left hydroureter nephrosis.  She had been recently discharged 2 weeks prior but was unable to get her p.o. vancomycin filled and during that time her diarrhea got worse.  She was not felt to be a surgical candidate given history of aortic stenosis but she also adamantly does not want surgery.  In addition she was found to have a left ovarian mass which is been there for years with plan for outpatient OB/GYN follow-up.  Patient was started on IV Invanz and oral vancomycin.  She was discharged to skilled nursing facility for 1 more month of Invanz for treatment of severe document reticulitis and also vancomycin pulse therapy for C. difficile.  She was at high risk for recurrence.  Patient did not wish to discuss palliative care.  Patient is being discharged to home for the last 15 days of her IV Invanz and for the remainder of her pulse p.o. vancomycin therapy.    Past Medical History:  Diagnosis Date  . A-fib ([place])   . Aortic stenosis, moderate    last echo in 2010; AVA 1.1; mild AS per echo in June 2013  . Edema of both legs    s/p laser treatment per Dr. Donnetta Hutching  . Hiatal hernia   . History of chicken pox   . HLD (hyperlipidemia)   . HTN (hypertension)   . Hypothyroidism   . IBS (irritable bowel syndrome)   . Iron deficiency anemia   . Neuropathic pain 09/06/2015  . Rheumatoid arthritis [facility])     Past Surgical History:  Procedure Laterality Date  .  APPENDECTOMY  1955  . CARPAL TUNNEL RELEASE Right   . CATARACT EXTRACTION    . ENDOSCOPIC VEIN LASER TREATMENT    . ENDOVENOUS ABLATION SAPHENOUS VEIN W/ LASER  08-29-2012   left greater saphenous vein   Sherren Mocha Early MD  . ENDOVENOUS ABLATION SAPHENOUS VEIN W/ LASER  09-19-2012   right greater saphenous vein by Curt Jews MD  . EYE SURGERY  2005   Bilateral cataract  . FOOT SURGERY  2003   bilateral Hammer Toe  . stab phlebectomy  Right 01-02-2013   10-15 incisions right thigh and calf by Curt Jews MD     reports that she quit smoking about 35 years ago. Her smoking use included cigarettes. She has never used smokeless tobacco. She reports that she does not drink alcohol or use drugs. Social History   Socioeconomic History  . Marital status: Widowed    Spouse name: Not on file  . Number of children: 2  . Years of education: Not on file  . Highest education level: Not on file  Occupational History  . Occupation: retired  Scientific laboratory technician  . Financial resource strain: Not on file  . Food insecurity:    Worry: Not on file    Inability: Not on file  . Transportation needs:    Medical: Not on file    Non-medical: Not on file  Tobacco Use  . Smoking status: Former Smoker    Types: Cigarettes    Last attempt to quit: 10/31/1983    Years since quitting: 35.0  . Smokeless tobacco: Never Used  Substance and Sexual Activity  . Alcohol use: No  . Drug use: No  . Sexual activity: Never  Lifestyle  . Physical activity:    Days per week: Not on file    Minutes per session: Not on file  . Stress: Not on file  Relationships  . Social connections:    Talks on phone: Not on file    Gets together: Not on file    Attends religious service: Not on file    Active member of club or organization: Not on file    Attends meetings of clubs or organizations: Not on file    Relationship status: Not on file  . Intimate partner violence:    Fear of current or ex partner: Not on file    Emotionally abused: Not on file    Physically abused: Not on file    Forced sexual activity: Not on file  Other Topics Concern  . Not on file  Social History Narrative   Lives by herself     Pertinent  Health Maintenance Due  Topic Date Due  . DEXA SCAN  07/03/2019 (Originally 06/27/1994)  . INFLUENZA VACCINE  Completed  . PNA vac Low Risk Adult  Completed    Medications: Allergies as of 10/25/2018      Reactions   Infliximab Other  (See Comments)   REACTION: "Enlarged intestines and hernia. Also pushed intestines on lower part of lungs." REACTION: "Enlarged intestines and hernia. Also pushed intestines on lower part of lungs."   Aspirin Nausea And Vomiting   Low Dose is ok Stomach problems   Carvedilol Other (See Comments)   Fatigue/ ill Fatigue/ ill   Codeine Nausea And Vomiting   "Deathly Sick"   Penicillins Rash   Has patient had a PCN reaction causing immediate rash, facial/tongue/throat swelling, SOB or lightheadedness with hypotension: Y Has patient had a PCN reaction causing severe rash involving mucus membranes  or skin necrosis: Y Has patient had a PCN reaction that required hospitalization: N Has patient had a PCN reaction occurring within the last 10 years: N If all of the above answers are "NO", then may proceed with Cephalosporin use.      Medication List       Accurate as of October 25, 2018 12:17 PM. Always use your most recent med list.        acetaminophen 500 MG tablet Commonly known as:  TYLENOL Take 1,000 mg by mouth every 6 (six) hours as needed (joint pain).   aspirin 81 MG chewable tablet Chew 81 mg by mouth daily.   ertapenem  IVPB Commonly known as:  INVANZ Inject 1 g into the vein daily for 27 days. Indication: Diverticulitis  Last Day of Therapy:  11/12/18 Labs - Once weekly:  CBC/D and BMP, Labs - Every other week:  ESR and CRP   feeding supplement (ENSURE ENLIVE) Liqd Take 237 mLs by mouth 2 (two) times daily between meals.   feeding supplement (ENSURE ENLIVE) Liqd Take 237 mLs by mouth 2 (two) times daily between meals.   gabapentin 100 MG capsule Commonly known as:  NEURONTIN Take 1 capsule (100 mg total) by mouth at bedtime.   levothyroxine 75 MCG tablet Commonly known as:  SYNTHROID, LEVOTHROID Take 1 tablet (75 mcg total) by mouth daily before breakfast.   metoprolol succinate 25 MG 24 hr tablet Commonly known as:  TOPROL-XL Take 3 tablets (75 mg total) by  mouth daily.   multivitamin with minerals Tabs tablet Take 1 tablet by mouth daily.   Olopatadine HCl 0.2 % Soln Place 1 drop into both eyes daily.   ondansetron 4 MG tablet Commonly known as:  ZOFRAN Take 1 tablet (4 mg total) by mouth every 8 (eight) hours as needed for nausea or vomiting.   vancomycin 50 mg/mL  oral solution Commonly known as:  VANCOCIN Take 2.5 mLs (125 mg total) by mouth 4 (four) times daily for 11 days.        Vitals:   10/25/18 1209  BP: (!) 154/93  Pulse: 89  Resp: 18  Temp: 97.8 F (36.6 C)  Weight: 118 lb (53.5 kg)  Height: 5' (1.524 m)   Body mass index is 23.05 kg/m.  Physical Exam  GENERAL APPEARANCE: Alert, conversant. No acute distress.  HEENT: Unremarkable. RESPIRATORY: Breathing is even, unlabored. Lung sounds are clear   CARDIOVASCULAR: Heart RRR 2/6 murmur, no rubs or gallops. No peripheral edema.  GASTROINTESTINAL: Abdomen is soft, non-tender, not distended w/ normal bowel sounds.  NEUROLOGIC: Cranial nerves 2-12 grossly intact. Moves all extremities   Labs reviewed: Basic Metabolic Panel: Recent Labs    09/24/18 0422 09/25/18 0914 09/26/18 0229  10/14/18 0334 10/15/18 0337 10/16/18 0420 10/21/18  NA 133* 132* 135   < > 136 136 137 140  K 3.7 4.0 4.3   < > 3.1* 3.3* 3.3* 3.2*  CL 104 103 103   < > 103 104 105  --   CO2 '25 24 27   ' < > '24 23 22  ' --   GLUCOSE 107* 108* 94   < > 99 91 86  --   BUN 11 5* <5*   < > 10 8 5* 7  CREATININE 0.60 0.62 0.60   < > 0.75 0.72 0.61 0.6  CALCIUM 7.4* 7.6* 7.8*   < > 7.6* 7.6* 7.4*  --   MG 2.3 1.6* 2.0  --   --   --   --   --  PHOS  --  2.0* 3.2  --   --   --   --   --    < > = values in this interval not displayed.   No results found for: [facility] Liver Function Tests: Recent Labs    09/25/18 0914 09/26/18 0229 10/13/18 0205  AST 14* 14* 24  ALT '5 6 8  ' ALKPHOS 50 49 61  BILITOT 0.5 0.5 0.8  PROT 5.9* 5.7* 6.6  ALBUMIN 2.5* 2.5* 2.5*   Recent Labs    07/24/18 1656   LIPASE 45   No results for input(s): AMMONIA in the last 8760 hours. CBC: Recent Labs    10/09/18 10/13/18 0205 10/14/18 0334 10/15/18 0337 10/16/18 0420 10/21/18  WBC 8.2 15.7* 11.6* 14.4* 9.9 7.7  NEUTROABS 5 11.8*  --   --  7.1  --   HGB 10.7* 11.0* 9.1* 9.6* 9.2* 10.2*  HCT 32* 35.8* 29.6* 31.4* 29.0* 31*  MCV  --  86.9 85.5 87.0 87.3  --   PLT 405* 316 273 320 332 454*   Lipid No results for input(s): CHOL, HDL, LDLCALC, TRIG in the last 8760 hours. Cardiac Enzymes: No results for input(s): CKTOTAL, CKMB, CKMBINDEX, TROPONINI in the last 8760 hours. BNP: No results for input(s): BNP in the last 8760 hours. CBG: Recent Labs    09/22/18 1608  GLUCAP 112*    Procedures and Imaging Studies During Stay: Ct Abdomen Pelvis W Contrast  Result Date: 10/13/2018 CLINICAL DATA:  Abdominal pain. Suspect diverticulitis. History of diverticulitis, appendectomy. EXAM: CT ABDOMEN AND PELVIS WITH CONTRAST TECHNIQUE: Multidetector CT imaging of the abdomen and pelvis was performed using the standard protocol following bolus administration of intravenous contrast. CONTRAST:  176m OMNIPAQUE IOHEXOL 300 MG/ML  SOLN COMPARISON:  CT abdomen and pelvis September 22, 2018. FINDINGS: LOWER CHEST: Chronic small pleural effusions. Heart is upper limits of normal. Coronary artery calcifications. No pericardial effusion. HEPATOBILIARY: Asymmetric gallbladder wall thickening versus sludge with mild chronic inflammation, unchanged. No gallbladder distension. Nonacute liver. Small fatty mass LEFT lobe of the liver is unchanged. PANCREAS: Normal. SPLEEN: 2 small cysts versus lymphangioma, otherwise unremarkable. ADRENALS/URINARY TRACT: Kidneys are orthotopic, demonstrating symmetric enhancement. LEFT upper pole scarring. No nephrolithiasis, hydronephrosis or solid renal masses. Worsening mild LEFT hydroureteronephrosis to level of the pelvis, transition point in pelvis at level of LEFT ovarian mass. Delayed  imaging through the kidneys demonstrates symmetric prompt contrast excretion within the proximal urinary collecting system. Urinary bladder is partially distended and unremarkable. Normal adrenal glands. STOMACH/BOWEL: Large hiatal hernia. Severe rectosigmoid inflammatory changes with pericolonic fat stranding. Colonic air-fluid levels. Small and large bowel normal course and caliber. Moderate colonic diverticulosis. Moderate amount of retained large bowel stool. VASCULAR/LYMPHATIC: Aortoiliac vessels are normal in course and caliber. Moderate calcific atherosclerosis. No lymphadenopathy by CT size criteria. REPRODUCTIVE: Multi cystic complex 4.8 x 5.6 cm LEFT adnexal mass. Persistent inflammatory changes in hyperemic LEFT fallopian tube with minimal gas. OTHER: Contracted pelvic phlegmon, improved. No intraperitoneal free fluid or free air. Presacral fat stranding again noted. MUSCULOSKELETAL: Nonacute.  Stable spondylosis. IMPRESSION: 1. Continued severe rectosigmoid diverticulitis/colitis. Residual pelvic phlegmon, without drainable fluid collection today. 2. Redemonstration of complex cystic LEFT ovarian mass, air tracking along the LEFT fallopian tube concerning for fistula. Recommend non emergent pelvic ultrasound for further characterization of LEFT ovarian mass. 3. Worsening mild LEFT hydroureteronephrosis obstructed at the level of the LEFT ovarian mass. 4. Cholelithiasis and image findings of chronic cholecystitis. Aortic Atherosclerosis (ICD10-I70.0). Electronically Signed   By:  Elon Alas M.D.   On: 10/13/2018 03:54   Dg Chest Port 1 View  Result Date: 10/13/2018 CLINICAL DATA:  Generalized weakness, diarrhea today. Recent diverticulitis. EXAM: PORTABLE CHEST 1 VIEW COMPARISON:  Chest radiograph September 23, 2018 FINDINGS: Cardiac silhouette is mildly enlarged. Mediastinal silhouette is not suspicious, calcified aortic arch. No pleural effusion or focal consolidation. Moderate hiatal hernia.  No pneumothorax. RIGHT PICC distal tip projects in distal superior vena cava. Osteopenia. IMPRESSION: 1. Mild cardiomegaly.  No acute pulmonary process. 2. RIGHT PICC distal tip projects in distal superior vena cava. 3.  Aortic Atherosclerosis (ICD10-I70.0). Electronically Signed   By: Elon Alas M.D.   On: 10/13/2018 02:29    Assessment/Plan:   No diagnosis found.   Patient is being discharged with the following home health services: OT/PT/nursing  Patient is being discharged with the following durable medical equipment: None  Patient has been advised to f/u with their PCP in 1-2 weeks to bring them up to date on their rehab stay.  Social services at facility was responsible for arranging this appointment.  Pt was provided with a 30 day supply of prescriptions for medications and refills must be obtained from their PCP.  For controlled substances, a more limited supply may be provided adequate until PCP appointment only.  Medications have been reconciled.  I am spent greater than 30 minutes;> 50% of time with patient was spent reviewing records, labs, tests and studies, counseling and developing plan of care  Noah Delaine. Sheppard Coil, MD

## 2018-10-26 ENCOUNTER — Encounter: Payer: Self-pay | Admitting: Internal Medicine

## 2018-10-26 MED ORDER — ASPIRIN 81 MG PO CHEW: 81.0000 mg | CHEWABLE_TABLET | Freq: Every day | ORAL | 0 refills | Status: DC

## 2018-10-26 MED ORDER — ALPRAZOLAM 0.25 MG PO TABS: 0.2500 mg | ORAL_TABLET | Freq: Every evening | ORAL | 0 refills | Status: DC | PRN

## 2018-10-26 MED ORDER — LEVOTHYROXINE SODIUM 75 MCG PO TABS: 75.0000 ug | ORAL_TABLET | Freq: Every day | ORAL | 2 refills | Status: DC

## 2018-10-26 MED ORDER — FIRST-VANCOMYCIN 50 MG/ML PO SOLN: 2.5000 mL | Freq: Every day | ORAL | 0 refills | Status: DC

## 2018-10-26 MED ORDER — OLOPATADINE HCL 0.2 % OP SOLN: 1.0000 [drp] | Freq: Every day | OPHTHALMIC | 2 refills | Status: DC

## 2018-10-26 MED ORDER — FIRST-VANCOMYCIN 50 MG/ML PO SOLN: 2.5000 mL | Freq: Two times a day (BID) | ORAL | 0 refills | Status: AC

## 2018-10-26 MED ORDER — METOPROLOL SUCCINATE ER 25 MG PO TB24: 75.0000 mg | ORAL_TABLET | Freq: Every day | ORAL | 0 refills | Status: DC

## 2018-10-26 MED ORDER — FIRST-VANCOMYCIN 50 MG/ML PO SOLN: 2.5000 mL | ORAL | 0 refills | Status: DC

## 2018-10-26 MED ORDER — GABAPENTIN 100 MG PO CAPS: 100.0000 mg | ORAL_CAPSULE | Freq: Every day | ORAL | 2 refills | Status: DC

## 2018-10-26 MED ORDER — ONDANSETRON HCL 4 MG PO TABS: 4.0000 mg | ORAL_TABLET | Freq: Three times a day (TID) | ORAL | 0 refills | Status: DC | PRN

## 2018-10-26 MED ORDER — ERTAPENEM IV (FOR PTA / DISCHARGE USE ONLY): 1.0000 g | INTRAVENOUS | 0 refills | Status: DC

## 2018-10-29 ENCOUNTER — Other Ambulatory Visit: Payer: Self-pay | Admitting: Internal Medicine

## 2018-10-29 ENCOUNTER — Encounter (HOSPITAL_COMMUNITY): Payer: Self-pay | Admitting: *Deleted

## 2018-10-29 ENCOUNTER — Emergency Department (HOSPITAL_COMMUNITY)
Admission: EM | Admit: 2018-10-29 | Discharge: 2018-10-29 | Disposition: A | Discharge status: Home / Self Care | Payer: Medicare Other | Attending: Emergency Medicine | Admitting: Emergency Medicine

## 2018-10-29 DIAGNOSIS — Z959 Presence of cardiac and vascular implant and graft, unspecified: Secondary | ICD-10-CM | POA: Insufficient documentation

## 2018-10-29 DIAGNOSIS — Z792 Long term (current) use of antibiotics: Secondary | ICD-10-CM | POA: Diagnosis not present

## 2018-10-29 MED ORDER — ALPRAZOLAM 0.25 MG PO TABS: 0.2500 mg | ORAL_TABLET | Freq: Every evening | ORAL | 0 refills | Status: DC | PRN

## 2018-10-29 NOTE — ED Provider Notes (Signed)
MSE was initiated and I personally evaluated the patient and placed orders (if any) at  4:40 PM on October 29, 2018.  Today's Vitals   10/29/18 1639 10/29/18 1645 10/29/18 1700  BP:  (!) 186/92   Pulse:  98   Resp:  18   Temp:  98.3 F (36.8 C)   SpO2:  98%   PainSc: 0-No pain  0-No pain    Patient here from [facility] due to need for antibiotics.  Patient has a PICC line, and is supposed to be receiving antibiotic infusions.  Her son has agreed to give these infusions, however he is currently in the hospital.  Patient's other son is refusing to give the infusion.  Home health is made aware of the situation, and contacted our case management, stating that they will take over giving the infusions.  As such, patient can be discharged back to [facility]. Discussed with patient, who does not want to be in the hospital, does not feel that she needs to be here.  She is agreeable to plan for discharge.  MSE performed, I do not believe pt needs further workup at this time.   The patient appears stable so that the remainder of the MSE may be completed by another provider.   Franchot Heidelberg, PA-C 10/29/18 Peck, [place], DO 10/29/18 2329

## 2018-10-29 NOTE — ED Triage Notes (Signed)
Per EMS, pt sent here from home for IV antibiotics. [facility] called and states they will take the patient and that the patient can be discharged to their facility for IV antibiotics.

## 2018-10-29 NOTE — Progress Notes (Signed)
CSW received a call from [facility] has been accepted for return to [facility] Number for report is: [phone] Pt's unit/room/bed number will be: [id number] Accepting physician: [place] MD  Pt can arrive ASAP today (10/29/18)  Nikki at [facility] that the [place] ask the pt is she still prefers to be "full code".  CSW will update RN and EDP.  Alphonse Guild. Braylie Badami, LCSW, LCAS, CSI Clinical Social Worker Ph: [phone]

## 2018-10-29 NOTE — Progress Notes (Addendum)
CSW received a call from [place] at [facility] who stated [facility] is willing to take the pt back.  Pt was D/C'd from [facility] voluntarily on 12/30 and was going home two days earlier than the plan originally was.  Nikki at [facility] stated she was attempting to facilitate pt's return to [facility] directly back from pt's home when [facility] learned pt  Was en route to the [facility] ED.  Nikki at [facility] stated she can take pt back ASAP today as pt's paperwork is still valid from her stay that ended yesterday.  CSW will confer with EDP and CN and call Nikki back.  4:37 PM  CSW received a call from [facility] ED RN CM who conferred with the ED CN and the EPD is seeing pt now to determine pt's ability to D/C back to [facility] when appropriate.  Per RN CN, ED CN is setting up PTAR should EPD find pt is safe for D/C back to [facility].  4:42 PM CSW received a call from [place] at [facility] who stated   CSW will continue to follow for D/C needs.  Alphonse Guild. Kester Stimpson, LCSW, LCAS, CSI Clinical Social Worker Ph: [phone]

## 2018-10-29 NOTE — ED Notes (Signed)
Bed: [facility] Expected date:  Expected time:  Means of arrival:  Comments: 

## 2018-10-29 NOTE — Discharge Instructions (Addendum)
All medications listed in the paperwork are ok to be continued.   Return to the ER with any new, worsening, or concerning symptoms.

## 2018-10-31 ENCOUNTER — Non-Acute Institutional Stay (SKILLED_NURSING_FACILITY): Payer: Medicare Other | Admitting: Internal Medicine

## 2018-10-31 ENCOUNTER — Encounter: Payer: Self-pay | Admitting: Internal Medicine

## 2018-10-31 DIAGNOSIS — K5792 Diverticulitis of intestine, part unspecified, without perforation or abscess without bleeding: Secondary | ICD-10-CM | POA: Diagnosis not present

## 2018-10-31 DIAGNOSIS — M792 Neuralgia and neuritis, unspecified: Secondary | ICD-10-CM

## 2018-10-31 DIAGNOSIS — A0472 Enterocolitis due to Clostridium difficile, not specified as recurrent: Secondary | ICD-10-CM | POA: Diagnosis not present

## 2018-10-31 DIAGNOSIS — E039 Hypothyroidism, unspecified: Secondary | ICD-10-CM | POA: Diagnosis not present

## 2018-10-31 DIAGNOSIS — K5720 Diverticulitis of large intestine with perforation and abscess without bleeding: Secondary | ICD-10-CM | POA: Diagnosis not present

## 2018-10-31 DIAGNOSIS — I10 Essential (primary) hypertension: Secondary | ICD-10-CM | POA: Diagnosis not present

## 2018-10-31 DIAGNOSIS — I4821 Permanent atrial fibrillation: Secondary | ICD-10-CM | POA: Diagnosis not present

## 2018-10-31 NOTE — Progress Notes (Signed)
:  Location:  [place] Room Number: 322G Place of Service:  SNF (31)  Tatum Corl D. Sheppard Coil, MD  Patient Care Team: Nche, Charlene Brooke, NP as PCP - General (Internal Medicine) Nahser, Wonda Cheng, MD (Cardiology) Gavin Pound, MD as Consulting Physician (Rheumatology) Tommy Medal, Lavell Islam, MD as Consulting Physician (Infectious Diseases) Bo Merino, MD as Consulting Physician (Rheumatology)  Extended Emergency Contact Information Primary Emergency Contact: Teodora Medici [address] [address] of [place] Phone: [phone] Mobile Phone: [phone] Relation: Son Secondary Emergency Contact: Maili of [place] Phone: [phone] Relation: Son     Allergies: Infliximab; Aspirin; Carvedilol; Codeine; and Penicillins  Chief Complaint  Patient presents with  . Readmit To SNF    Admit to [facility]    HPI: Patient is 85 y.o. female with past medical history of recurrent perforated diverticulitis, C. difficile with previous hospitalization, who was [facility] for recurrent C. difficile, acute diverticulitis and worsening left hydroureter nephrosis.  She had been discharged 2 weeks prior but was unable to get her p.o. vancomycin filled and during that time her diarrhea got worse.  She was not felt to be a surgical candidate given history of aortic stenosis but she also adamantly does not want surgery.  In addition she was found to have a left ovarian mass which is been there for years with plans for an outpatient OB/GYN follow-up.  Patient was started on IV Invanz and oral vancomycin.  She was discharged to skilled nursing facility for 1 more month of Invanz for treatment of severe recurrent diverticulitis and also vancomycin pulse therapy for recurrent C. difficile.  Patient was discharged to home for the last 15 days of her IV Invanz in the remainder of her pulse  p.o. vancomycin therapy but family was unable to give her her IV antibiotics and brought her to the emergency department at [facility] for them to give her her IV antibiotics.  At this point it was decided the patient should be readmitted to skilled nursing facility for the remainder of her IV antibiotics.  Patient is admitted to skilled nursing facility for several more weeks of IV antibiotic.  While at skilled nursing facility patient will be followed for neuropathy treated with Neurontin, hypothyroidism treated with replacement hypertension treated with Toprol-XL  Past Medical History:  Diagnosis Date  . A-fib ([place])   . Aortic stenosis, moderate    last echo in 2010; AVA 1.1; mild AS per echo in June 2013  . Edema of both legs    s/p laser treatment per Dr. Donnetta Hutching  . Hiatal hernia   . History of chicken pox   . HLD (hyperlipidemia)   . HTN (hypertension)   . Hypothyroidism   . IBS (irritable bowel syndrome)   . Iron deficiency anemia   . Neuropathic pain 09/06/2015  . Rheumatoid arthritis [facility])     Past Surgical History:  Procedure Laterality Date  . APPENDECTOMY  1955  . CARPAL TUNNEL RELEASE Right   . CATARACT EXTRACTION    . ENDOSCOPIC VEIN LASER TREATMENT    . ENDOVENOUS ABLATION SAPHENOUS VEIN W/ LASER  08-29-2012   left greater saphenous vein   Sherren Mocha Early MD  . ENDOVENOUS ABLATION SAPHENOUS VEIN W/ LASER  09-19-2012   right greater saphenous vein by Curt Jews MD  . EYE SURGERY  2005   Bilateral cataract  . FOOT  SURGERY  2003   bilateral Hammer Toe  . stab phlebectomy Right 01-02-2013   10-15 incisions right thigh and calf by Curt Jews MD    Allergies as of 10/31/2018      Reactions   Infliximab Other (See Comments)   REACTION: "Enlarged intestines and hernia. Also pushed intestines on lower part of lungs." REACTION: "Enlarged intestines and hernia. Also pushed intestines on lower part of lungs."   Aspirin Nausea And Vomiting   Low Dose is ok Stomach problems    Carvedilol Other (See Comments)   Fatigue/ ill Fatigue/ ill   Codeine Nausea And Vomiting   "Deathly Sick"   Penicillins Rash   Has patient had a PCN reaction causing immediate rash, facial/tongue/throat swelling, SOB or lightheadedness with hypotension: Y Has patient had a PCN reaction causing severe rash involving mucus membranes or skin necrosis: Y Has patient had a PCN reaction that required hospitalization: N Has patient had a PCN reaction occurring within the last 10 years: N If all of the above answers are "NO", then may proceed with Cephalosporin use.      Medication List       Accurate as of October 31, 2018  3:02 PM. Always use your most recent med list.        acetaminophen 500 MG tablet Commonly known as:  TYLENOL Take 1,000 mg by mouth every 8 (eight) hours as needed (joint pain).   ALPRAZolam 0.25 MG tablet Commonly known as:  XANAX Take 1 tablet (0.25 mg total) by mouth at bedtime as needed for anxiety (for 14 days). For 7 days   aspirin 81 MG chewable tablet Chew 1 tablet (81 mg total) by mouth daily.   ertapenem  IVPB Commonly known as:  INVANZ Inject 1 g into the vein daily for 27 days. Indication: Diverticulitis  Last Day of Therapy:  11/12/18 Labs - Once weekly:  CBC/D and BMP, Labs - Every other week:  ESR and CRP   feeding supplement (ENSURE ENLIVE) Liqd Take 237 mLs by mouth 2 (two) times daily between meals.   feeding supplement (ENSURE ENLIVE) Liqd Take 237 mLs by mouth 2 (two) times daily between meals.   FIRST-VANCOMYCIN 50 MG/ML Soln Take 2.5 mLs by mouth 2 (two) times daily for 7 days.   FIRST-VANCOMYCIN 50 MG/ML Soln Take 2.5 mLs by mouth daily for 7 days. Start taking on:  November 06, 2018   FIRST-VANCOMYCIN 50 MG/ML Soln Take 2.5 mLs by mouth every other day for 4 doses. Start taking on:  November 14, 2018   FIRST-VANCOMYCIN 50 MG/ML Soln Take 2.5 mLs by mouth every 3 (three) days for 2 doses. Start taking on:  November 21, 2018     gabapentin 100 MG capsule Commonly known as:  NEURONTIN Take 1 capsule (100 mg total) by mouth at bedtime.   levothyroxine 75 MCG tablet Commonly known as:  SYNTHROID, LEVOTHROID Take 1 tablet (75 mcg total) by mouth daily before breakfast.   metoprolol succinate 25 MG 24 hr tablet Commonly known as:  TOPROL-XL Take 3 tablets (75 mg total) by mouth daily.   multivitamin with minerals Tabs tablet Take 1 tablet by mouth daily.   Olopatadine HCl 0.2 % Soln Place 1 drop into both eyes daily.   ondansetron 4 MG tablet Commonly known as:  ZOFRAN Take 1 tablet (4 mg total) by mouth every 8 (eight) hours as needed for nausea or vomiting.       No orders of the defined types were  placed in this encounter.   Immunization History  Administered Date(s) Administered  . Influenza, High Dose Seasonal PF 09/17/2017, 08/09/2018  . Influenza,inj,Quad PF,6+ Mos 09/06/2015  . Influenza,trivalent, recombinat, inj, PF 08/01/2011, 06/21/2012  . Influenza-Unspecified 08/01/2011, 06/21/2012, 09/06/2015  . PPD Test 12/04/2011  . Pneumococcal Conjugate-13 08/09/2018  . Pneumococcal Polysaccharide-23 07/04/2011  . Tdap 07/04/2011, 08/17/2014    Social History   Tobacco Use  . Smoking status: Former Smoker    Types: Cigarettes    Last attempt to quit: 10/31/1983    Years since quitting: 35.0  . Smokeless tobacco: Never Used  Substance Use Topics  . Alcohol use: No    Family history is   Family History  Problem Relation Age of Onset  . Heart disease Mother   . Peripheral vascular disease Mother   . Heart disease Father   . Peripheral vascular disease Father        Right leg amputation  . COPD Father   . Heart disease Brother 43       Heart Disease before age 81  . Heart attack Brother   . Peripheral vascular disease Other   . Hypertension Sister       Review of Systems  DATA OBTAINED: from patient, nurse GENERAL:  no fevers, fatigue, appetite changes SKIN: No itching, or  rash EYES: No eye pain, redness, discharge EARS: No earache, tinnitus, change in hearing NOSE: No congestion, drainage or bleeding  MOUTH/THROAT: No mouth or tooth pain, No sore throat RESPIRATORY: No cough, wheezing, SOB CARDIAC: No chest pain, palpitations, lower extremity edema  GI: No abdominal pain, No N/V/D or constipation, No heartburn or reflux  GU: No dysuria, frequency or urgency, or incontinence  MUSCULOSKELETAL: No unrelieved bone/joint pain NEUROLOGIC: No headache, dizziness or focal weakness PSYCHIATRIC: No c/o anxiety or sadness   Vitals:   10/31/18 1449  BP: (!) 150/87  Pulse: 76  Temp: 98.2 F (36.8 C)    SpO2 Readings from Last 1 Encounters:  10/29/18 98%   Body mass index is 23.05 kg/m.     Physical Exam  GENERAL APPEARANCE: Alert, conversant,  No acute distress.  SKIN: No diaphoresis rash HEAD: Normocephalic, atraumatic  EYES: Conjunctiva/lids clear. Pupils round, reactive. EOMs intact.  EARS: External exam WNL, canals clear. Hearing grossly normal.  NOSE: No deformity or discharge.  MOUTH/THROAT: Lips w/o lesions  RESPIRATORY: Breathing is even, unlabored. Lung sounds are clear   CARDIOVASCULAR: Heart irregular, 2/6 systolic murmur, no rubs or gallops. No peripheral edema.   GASTROINTESTINAL: Abdomen is soft, non-tender, not distended w/ normal bowel sounds. GENITOURINARY: Bladder non tender, not distended  MUSCULOSKELETAL: No abnormal joints or musculature NEUROLOGIC:  Cranial nerves 2-12 grossly intact. Moves all extremities  PSYCHIATRIC: Mood and affect appropriate to situation, no behavioral issues  Patient Active Problem List   Diagnosis Date Noted  . Goals of care, counseling/discussion   . Hydronephrosis 10/13/2018  . Ovarian mass 10/13/2018  . C. difficile colitis 10/13/2018  . Clostridium difficile colitis 09/24/2018  . Hypokalemia   . Hypomagnesemia   . Left ovarian cyst   . Acute diverticulitis 09/07/2018  . Atrial fibrillation  with rapid ventricular response ([place]) 09/06/2018  . Leucocytosis 09/06/2018  . A-fib ([place]) 09/06/2018  . Malnutrition of moderate degree 07/26/2018  . Perforation of sigmoid colon due to diverticulitis 07/25/2018  . Thrombocytosis ([place]) 07/25/2018  . Sepsis ([place]) 07/24/2018  . Anxiety about health 07/03/2018  . Generalized abdominal pain 02/06/2018  . Paresthesia of both lower extremities  02/06/2018  . CKD (chronic kidney disease) stage 2, GFR 60-89 ml/min 01/07/2018  . Normocytic normochromic anemia 01/07/2018  . Hyperglycemia 01/07/2018  . Pyoderma gangrenosum 01/04/2017  . Primary osteoarthritis of both hands 01/04/2017  . Primary osteoarthritis of both feet 01/04/2017  . Primary osteoarthritis of both knees 01/04/2017  . Degenerative joint disease involving multiple joints 01/04/2017  . Neuropathic pain 09/06/2015  . Erythema nodosum 09/21/2014  . Rheumatoid arthritis ([place]) 09/21/2014  . Essential hypertension 09/02/2014  . Rectal bleeding 05/15/2014  . Atrial fibrillation, chronic 05/11/2014  . HLD (hyperlipidemia) 04/02/2014  . Hypothyroidism 04/02/2014  . PAC (premature atrial contraction) 01/23/2014  . Atrial tachycardia, paroxysmal ([place]) 01/23/2014  . Near syncope 01/22/2014  . Varicose veins of lower extremities with other complications 46/50/3546  . Venous insufficiency of both lower extremities 04/19/2012  . Leg edema 01/17/2012  . Aortic stenosis 03/22/2011      Labs reviewed: Basic Metabolic Panel:    Component Value Date/Time   NA 140 10/21/2018   K 3.2 (A) 10/21/2018   CL 105 10/16/2018 0420   CO2 22 10/16/2018 0420   GLUCOSE 86 10/16/2018 0420   BUN 7 10/21/2018   CREATININE 0.6 10/21/2018   CREATININE 0.61 10/16/2018 0420   CREATININE 0.92 (H) 08/09/2018 1511   CALCIUM 7.4 (L) 10/16/2018 0420   CALCIUM 8.6 10/09/2018   PROT 6.6 10/13/2018 0205   ALBUMIN 2.5 (L) 10/13/2018 0205   AST 24 10/13/2018 0205   ALT 8 10/13/2018 0205   ALKPHOS 61  10/13/2018 0205   BILITOT 0.8 10/13/2018 0205   GFRNONAA >60 10/16/2018 0420   GFRAA >60 10/16/2018 0420    Recent Labs    09/24/18 0422 09/25/18 0914 09/26/18 0229  10/14/18 0334 10/15/18 0337 10/16/18 0420 10/21/18  NA 133* 132* 135   < > 136 136 137 140  K 3.7 4.0 4.3   < > 3.1* 3.3* 3.3* 3.2*  CL 104 103 103   < > 103 104 105  --   CO2 '25 24 27   ' < > '24 23 22  ' --   GLUCOSE 107* 108* 94   < > 99 91 86  --   BUN 11 5* <5*   < > 10 8 5* 7  CREATININE 0.60 0.62 0.60   < > 0.75 0.72 0.61 0.6  CALCIUM 7.4* 7.6* 7.8*   < > 7.6* 7.6* 7.4*  --   MG 2.3 1.6* 2.0  --   --   --   --   --   PHOS  --  2.0* 3.2  --   --   --   --   --    < > = values in this interval not displayed.   Liver Function Tests: Recent Labs    09/25/18 0914 09/26/18 0229 10/13/18 0205  AST 14* 14* 24  ALT '5 6 8  ' ALKPHOS 50 49 61  BILITOT 0.5 0.5 0.8  PROT 5.9* 5.7* 6.6  ALBUMIN 2.5* 2.5* 2.5*   Recent Labs    07/24/18 1656  LIPASE 45   No results for input(s): AMMONIA in the last 8760 hours. CBC: Recent Labs    10/09/18 10/13/18 0205 10/14/18 0334 10/15/18 0337 10/16/18 0420 10/21/18  WBC 8.2 15.7* 11.6* 14.4* 9.9 7.7  NEUTROABS 5 11.8*  --   --  7.1  --   HGB 10.7* 11.0* 9.1* 9.6* 9.2* 10.2*  HCT 32* 35.8* 29.6* 31.4* 29.0* 31*  MCV  --  86.9 85.5 87.0  87.3  --   PLT 405* 316 273 320 332 454*   Lipid No results for input(s): CHOL, HDL, LDLCALC, TRIG in the last 8760 hours.  Cardiac Enzymes: No results for input(s): CKTOTAL, CKMB, CKMBINDEX, TROPONINI in the last 8760 hours. BNP: No results for input(s): BNP in the last 8760 hours. No results found for: [facility] Lab Results  Component Value Date   HGBA1C 6.0 01/07/2018   Lab Results  Component Value Date   TSH 1.732 09/06/2018   Lab Results  Component Value Date   VITAMINB12 330 01/07/2018   Lab Results  Component Value Date   FOLATE 9.0 01/07/2018   Lab Results  Component Value Date   IRON 28 (L) 01/07/2018    TIBC 276 04/02/2014   FERRITIN 72 04/02/2014    Imaging and Procedures obtained prior to SNF admission: No results found.   Not all labs, radiology exams or other studies done during hospitalization come through on my EPIC note; however they are reviewed by me.    Assessment and Plan  Recurrent diverticulitis with perforation- Invanz daily for 1 month total with stop date 11/22/2018  Recurrent C. difficile-continue pulse therapy with p.o. vancomycin with a stop date of 11/25/2018  Hypertension-controlled on Toprol-XL 75 mg daily; continue current therapy  Hypothyroidism- not stated as uncontrolled; continue with Synthroid 75 mcg daily  Neuropathy- continue Neurontin 100 mg p.o. nightly  Chronic atrial fibrillation-continue Toprol XL 75 mg daily for rate; patient is not on anticoagulation   Spent greater than 45 minutes;> 50% of time with patient was spent reviewing records, labs, tests and studies, counseling and developing plan of care  Webb Silversmith D. Sheppard Coil, MD

## 2018-11-01 ENCOUNTER — Encounter: Payer: Self-pay | Admitting: Internal Medicine

## 2018-11-04 LAB — BASIC METABOLIC PANEL
BUN: 9 (ref 4–21)
Creatinine: 0.7 (ref 0.5–1.1)
Glucose: 88
Potassium: 4 (ref 3.4–5.3)
Sodium: 141 (ref 137–147)

## 2018-11-04 LAB — CBC AND DIFFERENTIAL
HCT: 28 — AB (ref 36–46)
Hemoglobin: 9.4 — AB (ref 12.0–16.0)
Platelets: 390 (ref 150–399)
WBC: 10.1

## 2018-11-06 ENCOUNTER — Encounter: Payer: Self-pay | Admitting: Internal Medicine

## 2018-11-06 DIAGNOSIS — G629 Polyneuropathy, unspecified: Secondary | ICD-10-CM | POA: Insufficient documentation

## 2018-11-06 LAB — POCT ERYTHROCYTE SEDIMENTATION RATE, NON-AUTOMATED: Sed Rate: 54

## 2018-11-07 ENCOUNTER — Other Ambulatory Visit: Payer: Self-pay | Admitting: *Deleted

## 2018-11-07 NOTE — Patient Outreach (Signed)
[place] [facility]) Care Management  11/07/2018  [name] 09-Apr-1927 [id number]  Facility site visit to [facility] to discuss patient's progress and plan for transitioning to home. Met with [facility] SW and discharge planner.  Marita [facility] states patient has progressed well and patient's planned discharge date is Tuesday 11/12/18.   Visited patient at the bedside.  Patient stated she has struggled with severe diverticulitis and ended up receiving antibiotics through a PICC line. Patient stated she went home from the hospital then was readmitted to the hospital before coming to SNF.  Patient states she has done well with therapy and feels better but is still weak.  Patient denies need for transportation or medication assistance.  [place] [facility] services and gave patient a [facility] packet with contact information.  Patient agreed to services.   Referral placed for [place] to engage patient for transition of care.  [facility] UM Team member aware of [facility] CM referral.   Lydiah Pong RN, [place] Acute Care Coordinator [phone]) Business Mobile [phone]) Toll free office

## 2018-11-11 LAB — CBC AND DIFFERENTIAL
HCT: 28 — AB (ref 36–46)
Hemoglobin: 9.3 — AB (ref 12.0–16.0)
Platelets: 415 — AB (ref 150–399)
WBC: 10

## 2018-11-12 ENCOUNTER — Encounter: Payer: Self-pay | Admitting: Internal Medicine

## 2018-11-12 ENCOUNTER — Non-Acute Institutional Stay (SKILLED_NURSING_FACILITY): Payer: Medicare Other | Admitting: Internal Medicine

## 2018-11-12 DIAGNOSIS — A0472 Enterocolitis due to Clostridium difficile, not specified as recurrent: Secondary | ICD-10-CM | POA: Diagnosis not present

## 2018-11-12 DIAGNOSIS — N1330 Unspecified hydronephrosis: Secondary | ICD-10-CM | POA: Diagnosis not present

## 2018-11-12 DIAGNOSIS — N838 Other noninflammatory disorders of ovary, fallopian tube and broad ligament: Secondary | ICD-10-CM

## 2018-11-12 DIAGNOSIS — I10 Essential (primary) hypertension: Secondary | ICD-10-CM

## 2018-11-12 DIAGNOSIS — E039 Hypothyroidism, unspecified: Secondary | ICD-10-CM | POA: Diagnosis not present

## 2018-11-12 DIAGNOSIS — N83202 Unspecified ovarian cyst, left side: Secondary | ICD-10-CM

## 2018-11-12 DIAGNOSIS — K5792 Diverticulitis of intestine, part unspecified, without perforation or abscess without bleeding: Secondary | ICD-10-CM

## 2018-11-12 DIAGNOSIS — N182 Chronic kidney disease, stage 2 (mild): Secondary | ICD-10-CM | POA: Diagnosis not present

## 2018-11-12 DIAGNOSIS — M792 Neuralgia and neuritis, unspecified: Secondary | ICD-10-CM

## 2018-11-12 DIAGNOSIS — I482 Chronic atrial fibrillation, unspecified: Secondary | ICD-10-CM | POA: Diagnosis not present

## 2018-11-12 DIAGNOSIS — E876 Hypokalemia: Secondary | ICD-10-CM | POA: Diagnosis not present

## 2018-11-12 MED ORDER — FIRST-VANCOMYCIN 50 MG/ML PO SOLN: 125.0000 mg | ORAL | 0 refills | Status: AC

## 2018-11-12 MED ORDER — METOPROLOL SUCCINATE ER 25 MG PO TB24: 75.0000 mg | ORAL_TABLET | Freq: Every day | ORAL | 0 refills | Status: DC

## 2018-11-12 MED ORDER — VANCOMYCIN 50 MG/ML ORAL SOLUTION: 125.0000 mg | Freq: Every day | ORAL | 0 refills | Status: AC

## 2018-11-12 MED ORDER — FIRST-VANCOMYCIN 50 MG/ML PO SOLN: 2.5000 mL | Freq: Every day | ORAL | 0 refills | Status: AC

## 2018-11-12 NOTE — Progress Notes (Signed)
Location:  [facility] Room Number: 161W Place of Service:  SNF (31)  [name]. Sheppard Coil, MD  Patient Care Team: Nche, Charlene Brooke, NP as PCP - General (Internal Medicine) Nahser, Wonda Cheng, MD (Cardiology) Gavin Pound, MD as Consulting Physician (Rheumatology) Tommy Medal, Lavell Islam, MD as Consulting Physician (Infectious Diseases) Bo Merino, MD as Consulting Physician (Rheumatology) Tobi Bastos, RN as Jay Management  Extended Emergency Contact Information Primary Emergency Contact: Teodora Medici [address] [address] of [place] Phone: [phone] Mobile Phone: [phone] Relation: Son Secondary Emergency Contact: Hillegass,Jefferey  [place] Phone: [phone] Relation: Son  Allergies  Allergen Reactions  . Infliximab Other (See Comments)    REACTION: "Enlarged intestines and hernia. Also pushed intestines on lower part of lungs." REACTION: "Enlarged intestines and hernia. Also pushed intestines on lower part of lungs."  . Aspirin Nausea And Vomiting    Low Dose is ok Stomach problems  . Carvedilol Other (See Comments)    Fatigue/ ill Fatigue/ ill  . Codeine Nausea And Vomiting    "Deathly Sick"  . Penicillins Rash    Has patient had a PCN reaction causing immediate rash, facial/tongue/throat swelling, SOB or lightheadedness with hypotension: Y Has patient had a PCN reaction causing severe rash involving mucus membranes or skin necrosis: Y Has patient had a PCN reaction that required hospitalization: N Has patient had a PCN reaction occurring within the last 10 years: N If all of the above answers are "NO", then may proceed with Cephalosporin use.     Chief Complaint  Patient presents with  . Discharge Note    Discharge from [facility]    HPI:  85 y.o. female with past medical history of recurrent perforated diverticulitis, C. difficile with  previous hospitalization who was admitted to [facility] from 12/15-18 for recurrent C. difficile, acute diverticulitis and worsening left hydroureter nephrosis.  She had been recently discharged 2 weeks prior but was unable to get her p.o. vancomycin filled and during that time her diarrhea got worse.  She was not felt to be a surgical candidate given history of aortic stenosis and she also adamantly does not want surgery.  In addition she was found to have a left ovarian mass which is been there for years and plans for an outpatient OB/GYN follow-up.  Patient was started on IV Invance and oral vancomycin.  She was discharged to skilled nursing facility for 1 more month of IV Invance for treatment of severe diverticulitis and also vancomycin pulse therapy for C. difficile.  She is at high risk for recurrence.  Patient did not wish to discuss with palliative care.  Patient was admitted to skilled nursing facility for OT/PT and is now ready to be discharged home.    Past Medical History:  Diagnosis Date  . A-fib ([facility])   . Aortic stenosis, moderate    last echo in 2010; AVA 1.1; mild AS per echo in June 2013  . Edema of both legs    s/p laser treatment per Dr. Donnetta Hutching  . Hiatal hernia   . History of chicken pox   . HLD (hyperlipidemia)   . HTN (hypertension)   . Hypothyroidism   . IBS (irritable bowel syndrome)   . Iron deficiency anemia   . Neuropathic pain 09/06/2015  . Rheumatoid arthritis [facility])     Past Surgical History:  Procedure Laterality Date  .  APPENDECTOMY  1955  . CARPAL TUNNEL RELEASE Right   . CATARACT EXTRACTION    . ENDOSCOPIC VEIN LASER TREATMENT    . ENDOVENOUS ABLATION SAPHENOUS VEIN W/ LASER  08-29-2012   left greater saphenous vein   Sherren Mocha Early MD  . ENDOVENOUS ABLATION SAPHENOUS VEIN W/ LASER  09-19-2012   right greater saphenous vein by Curt Jews MD  . EYE SURGERY  2005   Bilateral cataract  . FOOT SURGERY  2003   bilateral Hammer Toe  . stab phlebectomy  Right 01-02-2013   10-15 incisions right thigh and calf by Curt Jews MD     reports that she quit smoking about 35 years ago. Her smoking use included cigarettes. She has never used smokeless tobacco. She reports that she does not drink alcohol or use drugs. Social History   Socioeconomic History  . Marital status: Widowed    Spouse name: Not on file  . Number of children: 2  . Years of education: Not on file  . Highest education level: Not on file  Occupational History  . Occupation: retired  Scientific laboratory technician  . Financial resource strain: Not on file  . Food insecurity:    Worry: Not on file    Inability: Not on file  . Transportation needs:    Medical: Not on file    Non-medical: Not on file  Tobacco Use  . Smoking status: Former Smoker    Types: Cigarettes    Last attempt to quit: 10/31/1983    Years since quitting: 35.0  . Smokeless tobacco: Never Used  Substance and Sexual Activity  . Alcohol use: No  . Drug use: No  . Sexual activity: Never  Lifestyle  . Physical activity:    Days per week: Not on file    Minutes per session: Not on file  . Stress: Not on file  Relationships  . Social connections:    Talks on phone: Not on file    Gets together: Not on file    Attends religious service: Not on file    Active member of club or organization: Not on file    Attends meetings of clubs or organizations: Not on file    Relationship status: Not on file  . Intimate partner violence:    Fear of current or ex partner: Not on file    Emotionally abused: Not on file    Physically abused: Not on file    Forced sexual activity: Not on file  Other Topics Concern  . Not on file  Social History Narrative   Lives by herself     Pertinent  Health Maintenance Due  Topic Date Due  . DEXA SCAN  07/03/2019 (Originally 06/27/1994)  . INFLUENZA VACCINE  Completed  . PNA vac Low Risk Adult  Completed    Medications: Allergies as of 11/12/2018      Reactions   Infliximab Other  (See Comments)   REACTION: "Enlarged intestines and hernia. Also pushed intestines on lower part of lungs." REACTION: "Enlarged intestines and hernia. Also pushed intestines on lower part of lungs."   Aspirin Nausea And Vomiting   Low Dose is ok Stomach problems   Carvedilol Other (See Comments)   Fatigue/ ill Fatigue/ ill   Codeine Nausea And Vomiting   "Deathly Sick"   Penicillins Rash   Has patient had a PCN reaction causing immediate rash, facial/tongue/throat swelling, SOB or lightheadedness with hypotension: Y Has patient had a PCN reaction causing severe rash involving mucus membranes  or skin necrosis: Y Has patient had a PCN reaction that required hospitalization: N Has patient had a PCN reaction occurring within the last 10 years: N If all of the above answers are "NO", then may proceed with Cephalosporin use.      Medication List       Accurate as of November 12, 2018 11:59 PM. Always use your most recent med list.        acetaminophen 500 MG tablet Commonly known as:  TYLENOL Take 1,000 mg by mouth every 8 (eight) hours as needed (joint pain).   aspirin 81 MG chewable tablet Chew 1 tablet (81 mg total) by mouth daily.   FIRST-VANCOMYCIN 50 MG/ML Soln Take 2.5 mLs by mouth daily for 2 doses.   FIRST-VANCOMYCIN 50 MG/ML Soln Take 125 mg by mouth every other day for 4 doses.   FIRST-VANCOMYCIN 50 MG/ML Soln Take 125 mg by mouth every 3 (three) days for 2 doses. Start taking on:  November 21, 2018   gabapentin 100 MG capsule Commonly known as:  NEURONTIN Take 1 capsule (100 mg total) by mouth at bedtime.   levothyroxine 75 MCG tablet Commonly known as:  SYNTHROID, LEVOTHROID Take 1 tablet (75 mcg total) by mouth daily before breakfast.   metoprolol succinate 25 MG 24 hr tablet Commonly known as:  TOPROL-XL Take 3 tablets (75 mg total) by mouth daily.   multivitamin with minerals Tabs tablet Take 1 tablet by mouth daily.   Olopatadine HCl 0.2 %  Soln Place 1 drop into both eyes daily.   ondansetron 4 MG tablet Commonly known as:  ZOFRAN Take 1 tablet (4 mg total) by mouth every 8 (eight) hours as needed for nausea or vomiting.   vancomycin 50 mg/mL  oral solution Commonly known as:  VANCOCIN Take 2.5 mLs (125 mg total) by mouth daily for 2 days.        Vitals:   11/12/18 0955  BP: 126/73  Pulse: 83  Temp: 98.8 F (37.1 C)  Weight: 118 lb (53.5 kg)  Height: 5' (1.524 m)   Body mass index is 23.05 kg/m.  Physical Exam  GENERAL APPEARANCE: Alert, conversant. No acute distress.  HEENT: Unremarkable. RESPIRATORY: Breathing is even, unlabored. Lung sounds are clear   CARDIOVASCULAR: Heart regular, 2/6 murmur, no rubs or gallops. No peripheral edema.  GASTROINTESTINAL: Abdomen is soft, non-tender, not distended w/ normal bowel sounds.  NEUROLOGIC: Cranial nerves 2-12 grossly intact. Moves all extremities   Labs reviewed: Basic Metabolic Panel: Recent Labs    09/24/18 0422 09/25/18 0914 09/26/18 0229  10/14/18 0334 10/15/18 0337 10/16/18 0420 10/21/18 11/04/18  NA 133* 132* 135   < > 136 136 137 140 141  K 3.7 4.0 4.3   < > 3.1* 3.3* 3.3* 3.2* 4.0  CL 104 103 103   < > 103 104 105  --   --   CO2 25 24 27    < > 24 23 22   --   --   GLUCOSE 107* 108* 94   < > 99 91 86  --   --   BUN 11 5* <5*   < > 10 8 5* 7 9  CREATININE 0.60 0.62 0.60   < > 0.75 0.72 0.61 0.6 0.7  CALCIUM 7.4* 7.6* 7.8*   < > 7.6* 7.6* 7.4*  --   --   MG 2.3 1.6* 2.0  --   --   --   --   --   --  PHOS  --  2.0* 3.2  --   --   --   --   --   --    < > = values in this interval not displayed.   No results found for: [facility] Liver Function Tests: Recent Labs    09/25/18 0914 09/26/18 0229 10/13/18 0205  AST 14* 14* 24  ALT 5 6 8   ALKPHOS 50 49 61  BILITOT 0.5 0.5 0.8  PROT 5.9* 5.7* 6.6  ALBUMIN 2.5* 2.5* 2.5*   Recent Labs    07/24/18 1656  LIPASE 45   No results for input(s): AMMONIA in the last 8760  hours. CBC: Recent Labs    10/09/18 10/13/18 0205 10/14/18 0334 10/15/18 0337 10/16/18 0420 10/21/18 11/04/18 11/11/18  WBC 8.2 15.7* 11.6* 14.4* 9.9 7.7 10.1 10.0  NEUTROABS 5 11.8*  --   --  7.1  --   --   --   HGB 10.7* 11.0* 9.1* 9.6* 9.2* 10.2* 9.4* 9.3*  HCT 32* 35.8* 29.6* 31.4* 29.0* 31* 28* 28*  MCV  --  86.9 85.5 87.0 87.3  --   --   --   PLT 405* 316 273 320 332 454* 390 415*   Lipid No results for input(s): CHOL, HDL, LDLCALC, TRIG in the last 8760 hours. Cardiac Enzymes: No results for input(s): CKTOTAL, CKMB, CKMBINDEX, TROPONINI in the last 8760 hours. BNP: No results for input(s): BNP in the last 8760 hours. CBG: Recent Labs    09/22/18 1608  GLUCAP 112*    Procedures and Imaging Studies During Stay: No results found.  Assessment/Plan:   Acute diverticulitis  Hypothyroidism, unspecified type  Clostridium difficile colitis  Atrial fibrillation, chronic  Essential hypertension  Left ovarian cyst  CKD (chronic kidney disease) stage 2, GFR 60-89 ml/min  Hydronephrosis, unspecified hydronephrosis type  Hypokalemia  Ovarian mass   Patient is being discharged with the following home health services: OT/PT/nursing  Patient is being discharged with the following durable medical equipment: None  Patient has been advised to f/u with their PCP in 1-2 weeks to bring them up to date on their rehab stay.  Social services at facility was responsible for arranging this appointment.  Pt was provided with a 30 day supply of prescriptions for medications and refills must be obtained from their PCP.  For controlled substances, a more limited supply may be provided adequate until PCP appointment only.  Medications have been reconciled.  Time spent greater than 60 minutes;> 50% of time with patient was spent reviewing records, labs, tests and studies, counseling and developing plan of care  [name]. Sheppard Coil, MD

## 2018-11-13 ENCOUNTER — Other Ambulatory Visit: Payer: Self-pay | Admitting: *Deleted

## 2018-11-13 NOTE — Patient Outreach (Signed)
[facility]) Care Management  11/13/2018  Shima Compere 02/12/1927 [phone]  [facility] [facility] discharge 1/14 for Transition of care  Initial transition of care outreach unsuccessful however able to leave a HIPAA approved voice message requesting a call back. Will further engage at that time and rescheduled another outreach call over the next week.. Will also send outreach letter for ongoing communication with this pt.   Raina Mina, RN Care Management Coordinator [place] Office [phone]

## 2018-11-14 ENCOUNTER — Encounter: Payer: Self-pay | Admitting: Internal Medicine

## 2018-11-15 ENCOUNTER — Telehealth: Payer: Self-pay | Admitting: Nurse Practitioner

## 2018-11-15 DIAGNOSIS — Z7982 Long term (current) use of aspirin: Secondary | ICD-10-CM | POA: Diagnosis not present

## 2018-11-15 DIAGNOSIS — A0471 Enterocolitis due to Clostridium difficile, recurrent: Secondary | ICD-10-CM | POA: Diagnosis not present

## 2018-11-15 DIAGNOSIS — K5780 Diverticulitis of intestine, part unspecified, with perforation and abscess without bleeding: Secondary | ICD-10-CM | POA: Diagnosis not present

## 2018-11-15 NOTE — Telephone Encounter (Signed)
Copied from [place]. Topic: Quick Communication - Home Health Verbal Orders >> Nov 15, 2018  3:02 PM Percell Belt A wrote: Caller/Agency: [facility]  Callback Number: [phone] Requesting OT/PT/Skilled Nursing/Social Work: Need [place] for skilled nursing  Frequency:  1 week 5

## 2018-11-15 NOTE — Telephone Encounter (Signed)
[name] please advise.   FYI: charlotte is out of the office until 11/19/2018.

## 2018-11-16 NOTE — Telephone Encounter (Signed)
[name] She is overdue on office visit with me

## 2018-11-18 ENCOUNTER — Other Ambulatory Visit: Payer: Self-pay | Admitting: *Deleted

## 2018-11-18 ENCOUNTER — Telehealth: Payer: Self-pay | Admitting: Nurse Practitioner

## 2018-11-18 DIAGNOSIS — K5780 Diverticulitis of intestine, part unspecified, with perforation and abscess without bleeding: Secondary | ICD-10-CM | POA: Diagnosis not present

## 2018-11-18 DIAGNOSIS — A0471 Enterocolitis due to Clostridium difficile, recurrent: Secondary | ICD-10-CM | POA: Diagnosis not present

## 2018-11-18 DIAGNOSIS — Z7982 Long term (current) use of aspirin: Secondary | ICD-10-CM | POA: Diagnosis not present

## 2018-11-18 NOTE — Telephone Encounter (Signed)
Son calling back and states he will speak with his mother then may call back to schedule.

## 2018-11-18 NOTE — Telephone Encounter (Signed)
Copied from [place] [id number]. Topic: Quick Communication - Home Health Verbal Orders >> Nov 18, 2018  4:44 PM Rutherford Nail, [place] wrote: Caller/Agency: Sharyn Lull, [place] Number: [phone] (can leave a message) Requesting OT/PT/Skilled Nursing/Social Work: Nursing Frequency:  1x a week for 5 weeks

## 2018-11-18 NOTE — Patient Outreach (Signed)
[name]) Care Management  11/18/2018  [name] [birth year] [id number]  Transition of care post SNF discharge (2nd call)  RN spoke with pt today and introduced the [facility] services and purpose for today's call. Discussed her recent discharged via SNF and involved services. Pt states she will have [facility] for PT services and indicates she has no other needs at this time however grateful for the follow up call. RN further offered any assistance with managing her medical issues and introduced other services available at [facility] such as social workers and pharmacy if she has any other needs. Pt indicated no needs and again appreciative for the information. Pt has opt to decline [facility] services both telephonically for transition of care call or other involved services but receptive the the contact name and number if needed in the future. Case will be closed.  Raina Mina, RN Care Management Coordinator [place] Office [phone]

## 2018-11-18 NOTE — Telephone Encounter (Signed)
Left vm for the pt call back, need to make an appt with Nche.   Last ov was 06/2018 and is due for a follow up in 09/2018 (no future appt made).

## 2018-11-19 NOTE — Telephone Encounter (Signed)
Left message inform Sharyn Lull that we need to see the pt first.

## 2018-11-21 ENCOUNTER — Telehealth: Payer: Self-pay | Admitting: Nurse Practitioner

## 2018-11-21 NOTE — Telephone Encounter (Signed)
Copied from [place] [id number]. Topic: General - Other >> Nov 21, 2018 10:55 AM Keene Breath wrote: Reason for CRM: Claiborne Billings with [facility] called to inform the doctor that they have not been able to get in contact with the patient for her PT for over a week.  [facility] will have to D/C the orders and have new orders submittted.  Please advance and call back if you have any questions.  CB# [phone]

## 2018-11-21 NOTE — Telephone Encounter (Signed)
FYI--pt has an appt with Nche on 11/26/2018.

## 2018-11-25 ENCOUNTER — Other Ambulatory Visit: Payer: Self-pay | Admitting: Nurse Practitioner

## 2018-11-25 DIAGNOSIS — F418 Other specified anxiety disorders: Secondary | ICD-10-CM

## 2018-11-25 NOTE — Telephone Encounter (Signed)
[name] please advise, this med discontinued 11/12/2018. Pt has an appt to see you 11/26/2018.

## 2018-11-26 ENCOUNTER — Ambulatory Visit (INDEPENDENT_AMBULATORY_CARE_PROVIDER_SITE_OTHER): Payer: Medicare Other | Admitting: Nurse Practitioner

## 2018-11-26 ENCOUNTER — Encounter: Payer: Self-pay | Admitting: Nurse Practitioner

## 2018-11-26 VITALS — BP 148/70 | HR 84 | Temp 98.8°F | Ht 60.0 in | Wt 116.6 lb

## 2018-11-26 DIAGNOSIS — E039 Hypothyroidism, unspecified: Secondary | ICD-10-CM

## 2018-11-26 DIAGNOSIS — M792 Neuralgia and neuritis, unspecified: Secondary | ICD-10-CM | POA: Diagnosis not present

## 2018-11-26 DIAGNOSIS — I10 Essential (primary) hypertension: Secondary | ICD-10-CM

## 2018-11-26 DIAGNOSIS — F418 Other specified anxiety disorders: Secondary | ICD-10-CM | POA: Diagnosis not present

## 2018-11-26 LAB — TSH: TSH: 2.38 u[IU]/mL (ref 0.35–4.50)

## 2018-11-26 LAB — T4, FREE: Free T4: 1.02 ng/dL (ref 0.60–1.60)

## 2018-11-26 MED ORDER — ALPRAZOLAM 0.25 MG PO TABS: 0.2500 mg | ORAL_TABLET | Freq: Every evening | ORAL | 1 refills | Status: DC | PRN

## 2018-11-26 NOTE — Patient Instructions (Addendum)
Stable thyroid function continue small frequent meals. May also drink Ensure 1bottle twice a day.

## 2018-11-26 NOTE — Progress Notes (Signed)
Subjective:  Patient ID: [name], female    DOB: 06-02-1927  Age: 85 y.o. MRN: [id number]  CC: Follow-up (follow up. pt stated she need a home health aid. xanax consult. )  HPI  accompanied by son   Diverticulitis with abscess hospital discharge to rehab for IV abx from 08/2019 to 10/2018 Returned Home with son 2weeks ago Use of oral vancomycin for C-difficile and diverticulitis Declined repeat CT ABD with contrast due to fear of side effects Reports resolved ABD pain, nausea and diarrhea per patient Son is concerned about continue weight loss since hospitalization. She denies any anorexia or early satiety, has 4small meals per day.  Anxiety: Needs alprazolam refilled today. PMP database reviewed. No red flags. Last filled 09/30/2018, #30tabs Use at bedtime.  Declined additiional home health services at this time  Reviewed past Medical, Social and Family history today.  Outpatient Medications Prior to Visit  Medication Sig Dispense Refill  . acetaminophen (TYLENOL) 500 MG tablet Take 1,000 mg by mouth every 8 (eight) hours as needed (joint pain).     Marland Kitchen aspirin 81 MG chewable tablet Chew 1 tablet (81 mg total) by mouth daily. 30 tablet 0  . gabapentin (NEURONTIN) 100 MG capsule Take 1 capsule (100 mg total) by mouth at bedtime. 30 capsule 2  . metoprolol succinate (TOPROL-XL) 25 MG 24 hr tablet Take 3 tablets (75 mg total) by mouth daily. 90 tablet 0  . Multiple Vitamin (MULTIVITAMIN WITH MINERALS) TABS tablet Take 1 tablet by mouth daily. 30 tablet 0  . Olopatadine HCl 0.2 % SOLN Place 1 drop into both eyes daily. 2.5 mL 2  . ondansetron (ZOFRAN) 4 MG tablet Take 1 tablet (4 mg total) by mouth every 8 (eight) hours as needed for nausea or vomiting. 20 tablet 0  . levothyroxine (SYNTHROID, LEVOTHROID) 75 MCG tablet Take 1 tablet (75 mcg total) by mouth daily before breakfast. 30 tablet 2   No facility-administered medications prior to visit.     ROS See  HPI  Objective:  BP (!) 148/70   Pulse 84   Temp 98.8 F (37.1 C) (Oral)   Ht 5' (1.524 m)   Wt 116 lb 9.6 oz (52.9 kg)   SpO2 100%   BMI 22.77 kg/m   BP Readings from Last 3 Encounters:  11/26/18 (!) 148/70  11/12/18 126/73  10/31/18 (!) 150/87    Wt Readings from Last 3 Encounters:  11/26/18 116 lb 9.6 oz (52.9 kg)  11/12/18 118 lb (53.5 kg)  10/31/18 118 lb (53.5 kg)    Physical Exam Cardiovascular:     Rate and Rhythm: Rhythm irregular.  Abdominal:     General: Bowel sounds are normal. There is no distension.     Palpations: Abdomen is soft. There is no mass.     Tenderness: There is no abdominal tenderness.  Musculoskeletal:     Right lower leg: No edema.     Left lower leg: No edema.  Neurological:     Mental Status: She is alert and oriented to person, place, and time.  Psychiatric:        Mood and Affect: Mood normal.        Thought Content: Thought content normal.     Lab Results  Component Value Date   WBC 10.0 11/11/2018   HGB 9.3 (A) 11/11/2018   HCT 28 (A) 11/11/2018   PLT 415 (A) 11/11/2018   GLUCOSE 86 10/16/2018   CHOL 240 (H) 07/29/2015   TRIG  220.0 (H) 07/29/2015   HDL 72.50 07/29/2015   LDLDIRECT 146.0 07/29/2015   LDLCALC 80 09/30/2012   ALT 8 10/13/2018   AST 24 10/13/2018   NA 141 11/04/2018   K 4.0 11/04/2018   CL 105 10/16/2018   CREATININE 0.7 11/04/2018   BUN 9 11/04/2018   CO2 22 10/16/2018   TSH 2.38 11/26/2018   INR 1.17 10/13/2018   HGBA1C 6.0 01/07/2018    Assessment & Plan:   Ariyanna was seen today for follow-up.  Diagnoses and all orders for this visit:  Anxiety about health -     ALPRAZolam (XANAX) 0.25 MG tablet; Take 1 tablet (0.25 mg total) by mouth at bedtime as needed for anxiety.  Essential hypertension  Hypothyroidism, unspecified type -     TSH -     T4, free -     levothyroxine (SYNTHROID, LEVOTHROID) 75 MCG tablet; Take 1 tablet (75 mcg total) by mouth daily before breakfast.  Neuropathic  pain   I am having Deloria Lair. Keehan start on ALPRAZolam. I am also having her maintain her acetaminophen, multivitamin with minerals, aspirin, gabapentin, Olopatadine HCl, ondansetron, metoprolol succinate, and levothyroxine.  Meds ordered this encounter  Medications  . ALPRAZolam (XANAX) 0.25 MG tablet    Sig: Take 1 tablet (0.25 mg total) by mouth at bedtime as needed for anxiety.    Dispense:  30 tablet    Refill:  1    Order Specific Question:   Supervising Provider    Answer:   Clearance Coots E [[id number]]  . levothyroxine (SYNTHROID, LEVOTHROID) 75 MCG tablet    Sig: Take 1 tablet (75 mcg total) by mouth daily before breakfast.    Dispense:  90 tablet    Refill:  1    Order Specific Question:   Supervising Provider    Answer:   Lucille Passy [[id number]]    Problem List Items Addressed This Visit      Cardiovascular and Mediastinum   Essential hypertension     Endocrine   Hypothyroidism   Relevant Medications   levothyroxine (SYNTHROID, LEVOTHROID) 75 MCG tablet   Other Relevant Orders   TSH (Completed)   T4, free (Completed)     Other   Anxiety about health - Primary   Relevant Medications   ALPRAZolam (XANAX) 0.25 MG tablet   Neuropathic pain       Follow-up: Return in about 4 weeks (around 12/24/2018) for weight loss.  Wilfred Lacy, NP

## 2018-11-27 ENCOUNTER — Encounter: Payer: Self-pay | Admitting: Nurse Practitioner

## 2018-11-27 MED ORDER — LEVOTHYROXINE SODIUM 75 MCG PO TABS: 75.0000 ug | ORAL_TABLET | Freq: Every day | ORAL | 1 refills | Status: DC

## 2018-12-04 ENCOUNTER — Telehealth: Payer: Self-pay | Admitting: Nurse Practitioner

## 2018-12-04 NOTE — Telephone Encounter (Signed)
Copied from [place] [id number]. Topic: Quick Communication - See Telephone Encounter >> Dec 04, 2018  3:54 PM Vernona Rieger wrote: CRM for notification. See Telephone encounter for: 12/04/18.  Patient would like her ALPRAZolam (XANAX) 0.25 MG tablet increased because what she is taking is not helping her sleep. Please Advise.

## 2018-12-04 NOTE — Telephone Encounter (Signed)
Please advise 

## 2018-12-05 NOTE — Telephone Encounter (Signed)
Left vm for the pt to call back, need to inform charlotte's response below. Portia for triage to give detail message.

## 2018-12-05 NOTE — Telephone Encounter (Signed)
I will not recommend increase in dose. We can revisit other medication that were discussed in past. She can make an appt to address this. Thank you

## 2018-12-06 NOTE — Telephone Encounter (Signed)
Pt stated she going to try benadryl.

## 2018-12-09 ENCOUNTER — Ambulatory Visit (INDEPENDENT_AMBULATORY_CARE_PROVIDER_SITE_OTHER): Payer: Medicare Other | Admitting: Nurse Practitioner

## 2018-12-09 ENCOUNTER — Encounter: Payer: Self-pay | Admitting: Nurse Practitioner

## 2018-12-09 VITALS — BP 132/72 | HR 79 | Temp 98.4°F | Ht 60.0 in | Wt 112.2 lb

## 2018-12-09 DIAGNOSIS — M792 Neuralgia and neuritis, unspecified: Secondary | ICD-10-CM

## 2018-12-09 MED ORDER — DULOXETINE HCL 30 MG PO CPEP: 30.0000 mg | ORAL_CAPSULE | Freq: Every day | ORAL | 1 refills | Status: DC

## 2018-12-09 NOTE — Patient Instructions (Addendum)
Start cymbalta 30mg  once a day after breakfast.  Maintain upcoming appt in 2weeks.  If no improvement, will need to refer to neurology.   Peripheral Neuropathy Peripheral neuropathy is a type of nerve damage. It affects nerves that carry signals between the spinal cord and the arms, legs, and the rest of the body (peripheral nerves). It does not affect nerves in the spinal cord or brain. In peripheral neuropathy, one nerve or a group of nerves may be damaged. Peripheral neuropathy is a broad category that includes many specific nerve disorders, like diabetic neuropathy, hereditary neuropathy, and carpal tunnel syndrome. What are the causes? This condition may be caused by:  Diabetes. This is the most common cause of peripheral neuropathy.  Nerve injury.  Pressure or stress on a nerve that lasts a long time.  Lack (deficiency) of B vitamins. This can result from alcoholism, poor diet, or a restricted diet.  Infections.  Autoimmune diseases, such as rheumatoid arthritis and systemic lupus erythematosus.  Nerve diseases that are passed from parent to child (inherited).  Some medicines, such as cancer medicines (chemotherapy).  Poisonous (toxic) substances, such as lead and mercury.  Too little blood flowing to the legs.  Kidney disease.  Thyroid disease. In some cases, the cause of this condition is not known. What are the signs or symptoms? Symptoms of this condition depend on which of your nerves is damaged. Common symptoms include:  Loss of feeling (numbness) in the feet, hands, or both.  Tingling in the feet, hands, or both.  Burning pain.  Very sensitive skin.  Weakness.  Not being able to move a part of the body (paralysis).  Muscle twitching.  Clumsiness or poor coordination.  Loss of balance.  Not being able to control your bladder.  Feeling dizzy.  Sexual problems. How is this diagnosed? Diagnosing and finding the cause of peripheral neuropathy can  be difficult. Your health care provider will take your medical history and do a physical exam. A neurological exam will also be done. This involves checking things that are affected by your brain, spinal cord, and nerves (nervous system). For example, your health care provider will check your reflexes, how you move, and what you can feel. You may have other tests, such as:  Blood tests.  Electromyogram (EMG) and nerve conduction tests. These tests check nerve function and how well the nerves are controlling the muscles.  Imaging tests, such as CT scans or MRI to rule out other causes of your symptoms.  Removing a small piece of nerve to be examined in a lab (nerve biopsy). This is rare.  Removing and examining a small amount of the fluid that surrounds the brain and spinal cord (lumbar puncture). This is rare. How is this treated? Treatment for this condition may involve:  Treating the underlying cause of the neuropathy, such as diabetes, kidney disease, or vitamin deficiencies.  Stopping medicines that can cause neuropathy, such as chemotherapy.  Medicine to relieve pain. Medicines may include: ? Prescription or over-the-counter pain medicine. ? Antiseizure medicine. ? Antidepressants. ? Pain-relieving patches that are applied to painful areas of skin.  Surgery to relieve pressure on a nerve or to destroy a nerve that is causing pain.  Physical therapy to help improve movement and balance.  Devices to help you move around (assistive devices). Follow these instructions at home: Medicines  Take over-the-counter and prescription medicines only as told by your health care provider. Do not take any other medicines without first asking your health  care provider.  Do not drive or use heavy machinery while taking prescription pain medicine. Lifestyle   Do not use any products that contain nicotine or tobacco, such as cigarettes and e-cigarettes. Smoking keeps blood from reaching  damaged nerves. If you need help quitting, ask your health care provider.  Avoid or limit alcohol. Too much alcohol can cause a vitamin B deficiency, and vitamin B is needed for healthy nerves.  Eat a healthy diet. This includes: ? Eating foods that are high in fiber, such as fresh fruits and vegetables, whole grains, and beans. ? Limiting foods that are high in fat and processed sugars, such as fried or sweet foods. General instructions   If you have diabetes, work closely with your health care provider to keep your blood sugar under control.  If you have numbness in your feet: ? Check every day for signs of injury or infection. Watch for redness, warmth, and swelling. ? Wear padded socks and comfortable shoes. These help protect your feet.  Develop a good support system. Living with peripheral neuropathy can be stressful. Consider talking with a mental health specialist or joining a support group.  Use assistive devices and attend physical therapy as told by your health care provider. This may include using a walker or a cane.  Keep all follow-up visits as told by your health care provider. This is important. Contact a health care provider if:  You have new signs or symptoms of peripheral neuropathy.  You are struggling emotionally from dealing with peripheral neuropathy.  Your pain is not well-controlled. Get help right away if:  You have an injury or infection that is not healing normally.  You develop new weakness in an arm or leg.  You fall frequently. Summary  Peripheral neuropathy is when the nerves in the arms, or legs are damaged, resulting in numbness, weakness, or pain.  There are many causes of peripheral neuropathy, including diabetes, pinched nerves, vitamin deficiencies, autoimmune disease, and hereditary conditions.  Diagnosing and finding the cause of peripheral neuropathy can be difficult. Your health care provider will take your medical history, do a  physical exam, and do tests, including blood tests and nerve function tests.  Treatment involves treating the underlying cause of the neuropathy and taking medicines to help control pain. Physical therapy and assistive devices may also help. This information is not intended to replace advice given to you by your health care provider. Make sure you discuss any questions you have with your health care provider. Document Released: 10/06/2002 Document Revised: 12/25/2016 Document Reviewed: 12/25/2016 Elsevier Interactive Patient Education  2019 [employer]  Duloxetine delayed-release capsules What is this medicine? DULOXETINE (doo LOX e teen) is used to treat depression, anxiety, and different types of chronic pain. This medicine may be used for other purposes; ask your health care provider or pharmacist if you have questions. COMMON BRAND NAME(S): Cymbalta, [place] What should I tell my health care provider before I take this medicine? They need to know if you have any of these conditions: -bipolar disorder or a family history of bipolar disorder -glaucoma -kidney disease -liver disease -suicidal thoughts or a previous suicide attempt -taken medicines called MAOIs like Carbex, Eldepryl, Marplan, Nardil, and Parnate within 14 days -an unusual reaction to duloxetine, other medicines, foods, dyes, or preservatives -pregnant or trying to get pregnant -breast-feeding How should I use this medicine? Take this medicine by mouth with a glass of water. Follow the directions on the prescription label. Do not cut, crush  or chew this medicine. You can take this medicine with or without food. Take your medicine at regular intervals. Do not take your medicine more often than directed. Do not stop taking this medicine suddenly except upon the advice of your doctor. Stopping this medicine too quickly may cause serious side effects or your condition may worsen. A special MedGuide will be given to you by the  pharmacist with each prescription and refill. Be sure to read this information carefully each time. Talk to your pediatrician regarding the use of this medicine in children. While this drug may be prescribed for children as young as 27 years of age for selected conditions, precautions do apply. Overdosage: If you think you have taken too much of this medicine contact a poison control center or emergency room at once. NOTE: This medicine is only for you. Do not share this medicine with others. What if I miss a dose? If you miss a dose, take it as soon as you can. If it is almost time for your next dose, take only that dose. Do not take double or extra doses. What may interact with this medicine? Do not take this medicine with any of the following medications: -desvenlafaxine -levomilnacipran -linezolid -MAOIs like Carbex, Eldepryl, Marplan, Nardil, and Parnate -methylene blue (injected into a vein) -milnacipran -thioridazine -venlafaxine This medicine may also interact with the following medications: -alcohol -amphetamines -aspirin and aspirin-like medicines -certain antibiotics like ciprofloxacin and enoxacin -certain medicines for blood pressure, heart disease, irregular heart beat -certain medicines for depression, anxiety, or psychotic disturbances -certain medicines for migraine headache like almotriptan, eletriptan, frovatriptan, naratriptan, rizatriptan, sumatriptan, zolmitriptan -certain medicines that treat or prevent blood clots like warfarin, enoxaparin, and dalteparin -cimetidine -fentanyl -lithium -NSAIDS, medicines for pain and inflammation, like ibuprofen or naproxen -phentermine -procarbazine -rasagiline -sibutramine -St. John's wort -theophylline -tramadol -tryptophan This list may not describe all possible interactions. Give your health care provider a list of all the medicines, herbs, non-prescription drugs, or dietary supplements you use. Also tell them if you  smoke, drink alcohol, or use illegal drugs. Some items may interact with your medicine. What should I watch for while using this medicine? Tell your doctor if your symptoms do not get better or if they get worse. Visit your doctor or health care professional for regular checks on your progress. Because it may take several weeks to see the full effects of this medicine, it is important to continue your treatment as prescribed by your doctor. Patients and their families should watch out for new or worsening thoughts of suicide or depression. Also watch out for sudden changes in feelings such as feeling anxious, agitated, panicky, irritable, hostile, aggressive, impulsive, severely restless, overly excited and hyperactive, or not being able to sleep. If this happens, especially at the beginning of treatment or after a change in dose, call your health care professional. Dennis Bast may get drowsy or dizzy. Do not drive, use machinery, or do anything that needs mental alertness until you know how this medicine affects you. Do not stand or sit up quickly, especially if you are an older patient. This reduces the risk of dizzy or fainting spells. Alcohol may interfere with the effect of this medicine. Avoid alcoholic drinks. This medicine can cause an increase in blood pressure. This medicine can also cause a sudden drop in your blood pressure, which may make you feel faint and increase the chance of a fall. These effects are most common when you first start the medicine or when the  dose is increased, or during use of other medicines that can cause a sudden drop in blood pressure. Check with your doctor for instructions on monitoring your blood pressure while taking this medicine. Your mouth may get dry. Chewing sugarless gum or sucking hard candy, and drinking plenty of water may help. Contact your doctor if the problem does not go away or is severe. What side effects may I notice from receiving this medicine? Side effects  that you should report to your doctor or health care professional as soon as possible: -allergic reactions like skin rash, itching or hives, swelling of the face, lips, or tongue -anxious -breathing problems -confusion -changes in vision -chest pain -confusion -elevated mood, decreased need for sleep, racing thoughts, impulsive behavior -eye pain -fast, irregular heartbeat -feeling faint or lightheaded, falls -feeling agitated, angry, or irritable -hallucination, loss of contact with reality -high blood pressure -loss of balance or coordination -palpitations -redness, blistering, peeling or loosening of the skin, including inside the mouth -restlessness, pacing, inability to keep still -seizures -stiff muscles -suicidal thoughts or other mood changes -trouble passing urine or change in the amount of urine -trouble sleeping -unusual bleeding or bruising -unusually weak or tired -vomiting -yellowing of the eyes or skin Side effects that usually do not require medical attention (report to your doctor or health care professional if they continue or are bothersome): -change in sex drive or performance -change in appetite or weight -constipation -dizziness -dry mouth -headache -increased sweating -nausea -tired This list may not describe all possible side effects. Call your doctor for medical advice about side effects. You may report side effects to FDA at 1-800-FDA-1088. Where should I keep my medicine? Keep out of the reach of children. Store at room temperature between 20 and 25 degrees C (68 to 77 degrees F). Throw away any unused medicine after the expiration date. NOTE: This sheet is a summary. It may not cover all possible information. If you have questions about this medicine, talk to your doctor, pharmacist, or health care provider.  2019 Elsevier/Gold Standard (2016-03-16 18:16:03)

## 2018-12-09 NOTE — Progress Notes (Signed)
Subjective:  Patient ID: [name], female    DOB: December 05, 1926  Age: 85 y.o. MRN: [id number]  CC: Medication Problem (Xanax is not working/pt wants what hospital gave her back in 09/2018)  HPI  Accompanied by son.  Bilateral LE neuropathy: Onset 2015. Start as left foot pain to left leg pain to bilateral LE pain (described as burning sensation, worse at night) She thinks pain is due to Remicade administered by Rheumatology (Dr. Trudie Reed) due to diagnosis of rheumatoid arthritis. Evaluated by podiatry, rheumatology, infectious disease, vein and vascular specialist. Reports pain is limiting ADLs and interferring with sleep. unable to tolerate lyrica (nausea). She does not want to use any other medication due to fear of possible side effects. She will like to resume gabapentin at HS despite side effects (dizziness). She also declined referral to neurology. Hx of significant saphenous vein reflux, s/p venous ablation by Dr. Donnetta Hutching 2013 Hx of venous ulcers, treated by Dr. Donnetta Hutching.  Today is request for increase xanax dose.  Reviewed past Medical, Social and Family history today.  Outpatient Medications Prior to Visit  Medication Sig Dispense Refill  . acetaminophen (TYLENOL) 500 MG tablet Take 1,000 mg by mouth every 8 (eight) hours as needed (joint pain).     Marland Kitchen ALPRAZolam (XANAX) 0.25 MG tablet Take 1 tablet (0.25 mg total) by mouth at bedtime as needed for anxiety. 30 tablet 1  . aspirin 81 MG chewable tablet Chew 1 tablet (81 mg total) by mouth daily. 30 tablet 0  . gabapentin (NEURONTIN) 100 MG capsule Take 1 capsule (100 mg total) by mouth at bedtime. 30 capsule 2  . levothyroxine (SYNTHROID, LEVOTHROID) 75 MCG tablet Take 1 tablet (75 mcg total) by mouth daily before breakfast. 90 tablet 1  . metoprolol succinate (TOPROL-XL) 25 MG 24 hr tablet Take 3 tablets (75 mg total) by mouth daily. 90 tablet 0  . Multiple Vitamin (MULTIVITAMIN WITH MINERALS) TABS tablet Take 1 tablet by  mouth daily. 30 tablet 0  . Olopatadine HCl 0.2 % SOLN Place 1 drop into both eyes daily. 2.5 mL 2  . ondansetron (ZOFRAN) 4 MG tablet Take 1 tablet (4 mg total) by mouth every 8 (eight) hours as needed for nausea or vomiting. 20 tablet 0   No facility-administered medications prior to visit.     ROS See HPI  Objective:  BP 132/72   Pulse 79   Temp 98.4 F (36.9 C) (Oral)   Ht 5' (1.524 m)   Wt 112 lb 3.2 oz (50.9 kg)   SpO2 100%   BMI 21.91 kg/m   BP Readings from Last 3 Encounters:  12/09/18 132/72  11/26/18 (!) 148/70  11/12/18 126/73    Wt Readings from Last 3 Encounters:  12/09/18 112 lb 3.2 oz (50.9 kg)  11/26/18 116 lb 9.6 oz (52.9 kg)  11/12/18 118 lb (53.5 kg)    Physical Exam Vitals signs reviewed.  Cardiovascular:     Rate and Rhythm: Normal rate. Rhythm irregular.     Pulses: Normal pulses.  Musculoskeletal:     Right lower leg: No edema.     Left lower leg: No edema.  Skin:    Findings: No erythema or rash.  Neurological:     Mental Status: She is alert and oriented to person, place, and time.     Sensory: Sensory deficit present.     Lab Results  Component Value Date   WBC 10.0 11/11/2018   HGB 9.3 (A) 11/11/2018   HCT  28 (A) 11/11/2018   PLT 415 (A) 11/11/2018   GLUCOSE 86 10/16/2018   CHOL 240 (H) 07/29/2015   TRIG 220.0 (H) 07/29/2015   HDL 72.50 07/29/2015   LDLDIRECT 146.0 07/29/2015   LDLCALC 80 09/30/2012   ALT 8 10/13/2018   AST 24 10/13/2018   NA 141 11/04/2018   K 4.0 11/04/2018   CL 105 10/16/2018   CREATININE 0.7 11/04/2018   BUN 9 11/04/2018   CO2 22 10/16/2018   TSH 2.38 11/26/2018   INR 1.17 10/13/2018   HGBA1C 6.0 01/07/2018    Assessment & Plan:   [name] was seen today for medication problem.  Diagnoses and all orders for this visit:  Neuropathic pain -     DULoxetine (CYMBALTA) 30 MG capsule; Take 1 capsule (30 mg total) by mouth daily.   I am having [name] start on DULoxetine. I am also having  her maintain her acetaminophen, multivitamin with minerals, aspirin, gabapentin, Olopatadine HCl, ondansetron, metoprolol succinate, ALPRAZolam, and levothyroxine.  Meds ordered this encounter  Medications  . DULoxetine (CYMBALTA) 30 MG capsule    Sig: Take 1 capsule (30 mg total) by mouth daily.    Dispense:  30 capsule    Refill:  1    Order Specific Question:   Supervising Provider    Answer:   Lucille Passy [[id number]]    Problem List Items Addressed This Visit      Other   Neuropathic pain - Primary    Onset 2015. Start as left foot pain to left leg pain to bilateral LE pain (described as burning sensation, worse at night) She thinks pain is due to Remicade administered by Rheumatology (Dr. Trudie Reed) due to diagnosis of rheumatoid arthritis. Evaluated by podiatry, rheumatology, infectious disease, vein and vascular specialist. Reports pain is limiting ADLs and interferring with sleep. unable to tolerate lyrica (nausea). She does not want to use any other medication due to fear of possible side effects. She will like to resume gabapentin at HS despite side effects (dizziness). She also declined referral to neurology. Hx of significant saphenous vein reflux, s/p venous ablation by Dr. Donnetta Hutching 2013 Hx of venous ulcers, treated by Dr. Donnetta Hutching.  Today is request for increase xanax dose. I reminded her that xanax is not used for neuropathic pain, hence an increase dose in inappropriate. I recommended use of elavil or cymbalta for pain. She decided to try cymbalta. If no improvement she will like referral to neurology. Advised her that it can take up to 2weeks for medication to reach therapeutic range. She verbalized understanding. cymbalta 30mg  sent We will f/up in 2weeks.      Relevant Medications   DULoxetine (CYMBALTA) 30 MG capsule       Follow-up: No follow-ups on file.  Wilfred Lacy, NP

## 2018-12-09 NOTE — Assessment & Plan Note (Signed)
Onset 2015. Start as left foot pain to left leg pain to bilateral LE pain (described as burning sensation, worse at night) She thinks pain is due to Remicade administered by Rheumatology (Dr. Trudie Reed) due to diagnosis of rheumatoid arthritis. Evaluated by podiatry, rheumatology, infectious disease, vein and vascular specialist. Reports pain is limiting ADLs and interferring with sleep. unable to tolerate lyrica (nausea). She does not want to use any other medication due to fear of possible side effects. She will like to resume gabapentin at HS despite side effects (dizziness). She also declined referral to neurology. Hx of significant saphenous vein reflux, s/p venous ablation by Dr. Donnetta Hutching 2013 Hx of venous ulcers, treated by Dr. Donnetta Hutching.  Today is request for increase xanax dose. I reminded her that xanax is not used for neuropathic pain, hence an increase dose in inappropriate. I recommended use of elavil or cymbalta for pain. She decided to try cymbalta. If no improvement she will like referral to neurology. Advised her that it can take up to 2weeks for medication to reach therapeutic range. She verbalized understanding. cymbalta 30mg  sent We will f/up in 2weeks.

## 2018-12-24 ENCOUNTER — Ambulatory Visit (INDEPENDENT_AMBULATORY_CARE_PROVIDER_SITE_OTHER): Payer: Medicare Other | Admitting: Nurse Practitioner

## 2018-12-24 ENCOUNTER — Encounter: Payer: Self-pay | Admitting: Nurse Practitioner

## 2018-12-24 VITALS — BP 130/72 | HR 95 | Temp 98.5°F | Ht 60.0 in | Wt 115.4 lb

## 2018-12-24 DIAGNOSIS — M792 Neuralgia and neuritis, unspecified: Secondary | ICD-10-CM | POA: Diagnosis not present

## 2018-12-24 DIAGNOSIS — F418 Other specified anxiety disorders: Secondary | ICD-10-CM

## 2018-12-24 DIAGNOSIS — I10 Essential (primary) hypertension: Secondary | ICD-10-CM | POA: Diagnosis not present

## 2018-12-24 MED ORDER — ALPRAZOLAM 0.25 MG PO TABS: 0.2500 mg | ORAL_TABLET | Freq: Every evening | ORAL | 2 refills | Status: DC | PRN

## 2018-12-24 MED ORDER — METOPROLOL SUCCINATE ER 25 MG PO TB24: 75.0000 mg | ORAL_TABLET | Freq: Every day | ORAL | 5 refills | Status: DC

## 2018-12-24 MED ORDER — GABAPENTIN 100 MG PO CAPS: 100.0000 mg | ORAL_CAPSULE | Freq: Every day | ORAL | 2 refills | Status: DC

## 2018-12-24 NOTE — Progress Notes (Signed)
Subjective:  Patient ID: [name], female    DOB: May 16, 1927  Age: 85 y.o. MRN: [id number]  CC: Follow-up (f/u on weight loss/cymbalta is making her sick,diarrhea/ )  HPI Accompanied by her son.  [name] reports she was unable to tolerate side effects of cymbalta. It caused dizziness and nausea despite taking with food or at bedtime. She will like to use only gabapentin at this time. She does not want referral to neurology either.  HTN: controlled with metoprolol. BP Readings from Last 3 Encounters:  12/24/18 130/72  12/09/18 132/72  11/26/18 (!) 148/70   Anxiety: Stable mood with use of xanax prn  Reviewed past Medical, Social and Family history today.  Outpatient Medications Prior to Visit  Medication Sig Dispense Refill  . acetaminophen (TYLENOL) 500 MG tablet Take 1,000 mg by mouth every 8 (eight) hours as needed (joint pain).     Marland Kitchen aspirin 81 MG chewable tablet Chew 1 tablet (81 mg total) by mouth daily. 30 tablet 0  . levothyroxine (SYNTHROID, LEVOTHROID) 75 MCG tablet Take 1 tablet (75 mcg total) by mouth daily before breakfast. 90 tablet 1  . Multiple Vitamin (MULTIVITAMIN WITH MINERALS) TABS tablet Take 1 tablet by mouth daily. 30 tablet 0  . Olopatadine HCl 0.2 % SOLN Place 1 drop into both eyes daily. 2.5 mL 2  . ondansetron (ZOFRAN) 4 MG tablet Take 1 tablet (4 mg total) by mouth every 8 (eight) hours as needed for nausea or vomiting. 20 tablet 0  . ALPRAZolam (XANAX) 0.25 MG tablet Take 1 tablet (0.25 mg total) by mouth at bedtime as needed for anxiety. 30 tablet 1  . gabapentin (NEURONTIN) 100 MG capsule Take 1 capsule (100 mg total) by mouth at bedtime. 30 capsule 2  . metoprolol succinate (TOPROL-XL) 25 MG 24 hr tablet Take 3 tablets (75 mg total) by mouth daily. 90 tablet 0  . DULoxetine (CYMBALTA) 30 MG capsule Take 1 capsule (30 mg total) by mouth daily. (Patient not taking: Reported on 12/24/2018) 30 capsule 1   No facility-administered  medications prior to visit.     ROS See HPI  Objective:  BP 130/72   Pulse 95   Temp 98.5 F (36.9 C) (Oral)   Ht 5' (1.524 m)   Wt 115 lb 6.4 oz (52.3 kg)   SpO2 98%   BMI 22.54 kg/m   BP Readings from Last 3 Encounters:  12/24/18 130/72  12/09/18 132/72  11/26/18 (!) 148/70    Wt Readings from Last 3 Encounters:  12/24/18 115 lb 6.4 oz (52.3 kg)  12/09/18 112 lb 3.2 oz (50.9 kg)  11/26/18 116 lb 9.6 oz (52.9 kg)    Physical Exam Vitals signs reviewed.  Cardiovascular:     Pulses: Normal pulses.  Pulmonary:     Effort: Pulmonary effort is normal.  Musculoskeletal:     Right lower leg: No edema.     Left lower leg: No edema.  Neurological:     Mental Status: She is alert and oriented to person, place, and time.     Lab Results  Component Value Date   WBC 10.0 11/11/2018   HGB 9.3 (A) 11/11/2018   HCT 28 (A) 11/11/2018   PLT 415 (A) 11/11/2018   GLUCOSE 86 10/16/2018   CHOL 240 (H) 07/29/2015   TRIG 220.0 (H) 07/29/2015   HDL 72.50 07/29/2015   LDLDIRECT 146.0 07/29/2015   LDLCALC 80 09/30/2012   ALT 8 10/13/2018   AST 24 10/13/2018  NA 141 11/04/2018   K 4.0 11/04/2018   CL 105 10/16/2018   CREATININE 0.7 11/04/2018   BUN 9 11/04/2018   CO2 22 10/16/2018   TSH 2.38 11/26/2018   INR 1.17 10/13/2018   HGBA1C 6.0 01/07/2018    Assessment & Plan:   [name] was seen today for follow-up.  Diagnoses and all orders for this visit:  Neuropathic pain -     gabapentin (NEURONTIN) 100 MG capsule; Take 1 capsule (100 mg total) by mouth at bedtime.  Anxiety about health -     ALPRAZolam (XANAX) 0.25 MG tablet; Take 1 tablet (0.25 mg total) by mouth at bedtime as needed for anxiety.  Essential hypertension -     metoprolol succinate (TOPROL-XL) 25 MG 24 hr tablet; Take 3 tablets (75 mg total) by mouth daily.  Hypothyroidism, unspecified type   I have discontinued Sydney Lair. Mcdonald's DULoxetine. I am also having her maintain her acetaminophen,  multivitamin with minerals, aspirin, Olopatadine HCl, ondansetron, levothyroxine, ALPRAZolam, gabapentin, and metoprolol succinate.  Meds ordered this encounter  Medications  . ALPRAZolam (XANAX) 0.25 MG tablet    Sig: Take 1 tablet (0.25 mg total) by mouth at bedtime as needed for anxiety.    Dispense:  30 tablet    Refill:  2    Order Specific Question:   Supervising Provider    Answer:   MATTHEWS, CODY [[id number]]  . gabapentin (NEURONTIN) 100 MG capsule    Sig: Take 1 capsule (100 mg total) by mouth at bedtime.    Dispense:  30 capsule    Refill:  2    Order Specific Question:   Supervising Provider    Answer:   MATTHEWS, CODY [[id number]]  . metoprolol succinate (TOPROL-XL) 25 MG 24 hr tablet    Sig: Take 3 tablets (75 mg total) by mouth daily.    Dispense:  90 tablet    Refill:  5    Order Specific Question:   Supervising Provider    Answer:   MATTHEWS, CODY [[id number]]    Problem List Items Addressed This Visit      Cardiovascular and Mediastinum   Essential hypertension   Relevant Medications   metoprolol succinate (TOPROL-XL) 25 MG 24 hr tablet     Endocrine   Hypothyroidism   Relevant Medications   metoprolol succinate (TOPROL-XL) 25 MG 24 hr tablet     Other   Anxiety about health   Relevant Medications   ALPRAZolam (XANAX) 0.25 MG tablet   Neuropathic pain - Primary   Relevant Medications   gabapentin (NEURONTIN) 100 MG capsule       Follow-up: Return in about 3 months (around 03/24/2019) for HTN, weight loss, and leg pain (36mins).  Wilfred Lacy, NP

## 2018-12-26 ENCOUNTER — Encounter: Payer: Self-pay | Admitting: Nurse Practitioner

## 2018-12-26 NOTE — Assessment & Plan Note (Signed)
[name] reports she was unable to tolerate side effects of cymbalta. It caused dizziness and nausea despite taking with food or at bedtime. She will like to use only gabapentin at this time. She does not want referral to neurology either.

## 2019-02-26 ENCOUNTER — Ambulatory Visit (INDEPENDENT_AMBULATORY_CARE_PROVIDER_SITE_OTHER): Payer: Medicare Other | Admitting: Nurse Practitioner

## 2019-02-26 ENCOUNTER — Other Ambulatory Visit: Payer: Self-pay

## 2019-02-26 ENCOUNTER — Encounter: Payer: Self-pay | Admitting: Nurse Practitioner

## 2019-02-26 VITALS — Ht 60.0 in | Wt 115.0 lb

## 2019-02-26 DIAGNOSIS — R300 Dysuria: Secondary | ICD-10-CM | POA: Diagnosis not present

## 2019-02-26 MED ORDER — SULFAMETHOXAZOLE-TRIMETHOPRIM 800-160 MG PO TABS: 1.0000 | ORAL_TABLET | Freq: Two times a day (BID) | ORAL | 0 refills | Status: DC

## 2019-02-26 NOTE — Progress Notes (Signed)
Virtual Visit via Video Note  I connected with [name] on 02/26/19 at  1:30 PM EDT by a video enabled telemedicine application and verified that I am speaking with the correct person using two identifiers.   I discussed the limitations of evaluation and management by telemedicine and the availability of in person appointments. The patient expressed understanding and agreed to proceed.  Provider: [facility] Patient: Home  CC: pt is c/o dyspuria,frequent urinate/going on 2 days/took azo--not better.   Son present during video call. History of Present Illness: Urinary Tract Infection   This is a new problem. The current episode started in the past 7 days. The problem occurs every urination. The problem has been gradually worsening. The quality of the pain is described as burning. There has been no fever. She is not sexually active. There is no history of pyelonephritis. Associated symptoms include frequency and urgency. Pertinent negatives include no chills, discharge, flank pain, hematuria, hesitancy, nausea, possible pregnancy, sweats or vomiting. She has tried increased fluids (azo) for the symptoms. The treatment provided no relief. There is no history of recurrent UTIs or urinary stasis.  denies any constipation or diarrhea.   Observations/Objective: No vital signs to provide. Physical Exam  Constitutional: She is oriented to person, place, and time. No distress.  Pulmonary/Chest: Effort normal.  Neurological: She is alert and oriented to person, place, and time.  Psychiatric: She has a normal mood and affect. Her behavior is normal.   Assessment and Plan: [name] was seen today for urinary tract infection.  Diagnoses and all orders for this visit:  Dysuria -     sulfamethoxazole-trimethoprim (BACTRIM DS) 800-160 MG tablet; Take 1 tablet by mouth 2 (two) times daily. With food   Follow Up Instructions: Maintain adequate oral hydration May use OTC probiotic 1cap daily or  activia yogurt while taking oral abx. Call office if no improvement in 1week.   I discussed the assessment and treatment plan with the patient. The patient was provided an opportunity to ask questions and all were answered. The patient agreed with the plan and demonstrated an understanding of the instructions.   The patient was advised to call back or seek an in-person evaluation if the symptoms worsen or if the condition fails to improve as anticipated.   Wilfred Lacy, NP

## 2019-03-03 ENCOUNTER — Telehealth (INDEPENDENT_AMBULATORY_CARE_PROVIDER_SITE_OTHER): Payer: Medicare Other | Admitting: Nurse Practitioner

## 2019-03-03 ENCOUNTER — Other Ambulatory Visit: Payer: Medicare Other

## 2019-03-03 DIAGNOSIS — R300 Dysuria: Secondary | ICD-10-CM

## 2019-03-03 DIAGNOSIS — R3915 Urgency of urination: Secondary | ICD-10-CM

## 2019-03-03 DIAGNOSIS — R350 Frequency of micturition: Secondary | ICD-10-CM

## 2019-03-03 MED ORDER — PHENAZOPYRIDINE HCL 100 MG PO TABS: 100.0000 mg | ORAL_TABLET | Freq: Three times a day (TID) | ORAL | 0 refills | Status: DC | PRN

## 2019-03-03 NOTE — Telephone Encounter (Signed)
Pyridium 100mg  TID, #9, no refills

## 2019-03-03 NOTE — Telephone Encounter (Signed)
Needs to complete oral abx as prescribed. Also needs to return to lab to urinalysis with reflex culture. Enter order

## 2019-03-03 NOTE — Telephone Encounter (Signed)
Sorry I forgot that the pt mention she felt sick in her stomach too--feels like vomiting but nothing comes up--please advise.

## 2019-03-03 NOTE — Telephone Encounter (Signed)
ok 

## 2019-03-03 NOTE — Telephone Encounter (Signed)
Son just came back to drop off urine sample.   [name] was wondering if [name] can give her something for burning sensation while waiting on urine result--pleas advise.

## 2019-03-03 NOTE — Telephone Encounter (Signed)
Rx sent and pt is aware.

## 2019-03-03 NOTE — Telephone Encounter (Signed)
Unfortunate we can not accept another specimen cup because we can not ensure sterility of that cup. Her son can pick up specimen and collection hat.

## 2019-03-03 NOTE — Telephone Encounter (Signed)
Copied from [place] [id number]. Topic: General - Inquiry >> Mar 03, 2019  9:17 AM Margot Ables wrote: Reason for CRM: Pt called to f/u with [facility]. She notes she was advised to call back. She is still having burning with urination, frequency, and urgency. If she doesn't get up to go she will have to clean it up. She is not getting much sleep due to the issue. Pt asking how to proceed. Please call back at [phone].

## 2019-03-03 NOTE — Telephone Encounter (Signed)
Pt stated she finish the abx but she still has some burning and frequent urinate. Pt stated she is too sick to come in to give urine sample but she has specimen cup from another doctor--she can bring urine sample using that cup--Angela said it was ok.   [name] pls advise.  Order entered in [place].

## 2019-03-04 MED ORDER — PHENAZOPYRIDINE HCL 100 MG PO TABS: 100.0000 mg | ORAL_TABLET | Freq: Three times a day (TID) | ORAL | 0 refills | Status: DC | PRN

## 2019-03-04 NOTE — Addendum Note (Signed)
Addended by: Wilfred Lacy L on: 03/04/2019 08:07 AM   Modules accepted: Orders

## 2019-03-05 LAB — URINALYSIS W MICROSCOPIC + REFLEX CULTURE
Bilirubin Urine: NEGATIVE
Glucose, UA: NEGATIVE
Ketones, ur: NEGATIVE
Nitrites, Initial: NEGATIVE
Specific Gravity, Urine: 1.015 (ref 1.001–1.03)
pH: <=5 (ref 5.0–8.0)

## 2019-03-05 LAB — URINE CULTURE
MICRO NUMBER:: 446047
SPECIMEN QUALITY:: ADEQUATE

## 2019-03-05 LAB — CULTURE INDICATED

## 2019-03-05 MED ORDER — SOLIFENACIN SUCCINATE 5 MG PO TABS: 5.0000 mg | ORAL_TABLET | Freq: Every day | ORAL | 0 refills | Status: DC

## 2019-03-05 MED ORDER — OXYBUTYNIN CHLORIDE 5 MG PO TABS: 5.0000 mg | ORAL_TABLET | Freq: Two times a day (BID) | ORAL | 1 refills | Status: DC

## 2019-03-05 NOTE — Telephone Encounter (Signed)
Pt. Returned call.  Advised of recommendation by Wilfred Lacy to go to the ED to rule out Diverticulitis flare up.  Stated she is much better today, and does not want to go to the ED.  Reported the "knife-like pain and burning with urination has subsided."  Stated she still has urgency to urinate, and at times does not get to the bathroom quick enough, and is incontinent.  Denied any fever / chills.  Denied any abdominal pain.  Reported she is drinking large quantities of Cranberry juice, water, and orange juice.  Reported the Pyridium has really helped with the discomfort.  Stated "I haven't gone out since we were advised to stay at home, and I am not going to the ER.  Advised will send message to PCP with update on symptoms.  Pt. Agreed with plan.

## 2019-03-05 NOTE — Addendum Note (Signed)
Addended by: Wilfred Lacy L on: 03/05/2019 10:18 AM   Modules accepted: Orders

## 2019-03-05 NOTE — Telephone Encounter (Signed)
Left vm for the pt to call back.  

## 2019-03-05 NOTE — Addendum Note (Signed)
Addended byShawnie Pons on: 03/05/2019 04:00 PM   Modules accepted: Orders

## 2019-03-05 NOTE — Telephone Encounter (Signed)
Pt is aware.  

## 2019-03-05 NOTE — Telephone Encounter (Signed)
Pt call last night around 10:20 pm stating she still experiencing frequent urinate, abd pain. Pt stated Phenazopyridine 100 mg TID didn't seem to help.   Pt still waiting for urine culture, please advise.

## 2019-03-05 NOTE — Telephone Encounter (Signed)
vesicare sent in place of oxybutinin

## 2019-03-05 NOTE — Telephone Encounter (Signed)
Ok to send Oxybutynin Chloride 5 mg tab BID # 60 with 1 refill per [name]. Rx sent.

## 2019-03-05 NOTE — Telephone Encounter (Signed)
[name] please advise 

## 2019-03-05 NOTE — Telephone Encounter (Signed)
Urine culture does not indicates UTI. Please advise to go to  ED to rule out another diverticulitis exacerbation

## 2019-03-05 NOTE — Telephone Encounter (Signed)
I entered referral to urology. Also sent oxybutinin to help with urinary urgency.

## 2019-03-05 NOTE — Addendum Note (Signed)
Addended by: Wilfred Lacy L on: 03/05/2019 01:40 PM   Modules accepted: Orders

## 2019-03-12 NOTE — Telephone Encounter (Signed)
Spoke with the pt and advise message below. Pt stated she spoke with urologist and appt declined by the pt due to pandemic. Pt stated she going to hold on for right now and give [name] a call back if she has questions.

## 2019-03-12 NOTE — Telephone Encounter (Signed)
Pt call back stated she finish the pyridium and burning and frequent urinate is back---oxybutynin is not helping but the pyridium did for couple days. Pt going to call the urology back today--since she missed the call from them.   [name] please advise--pt wants to know what to do in the mean time.

## 2019-03-12 NOTE — Telephone Encounter (Signed)
I recommend hold pyridium refill at this time till she is seen by urologist. Continue oxybutinin as prescribed

## 2019-03-14 ENCOUNTER — Telehealth: Payer: Self-pay

## 2019-03-14 ENCOUNTER — Telehealth: Payer: Self-pay | Admitting: Nurse Practitioner

## 2019-03-14 NOTE — Telephone Encounter (Signed)
Noted, advise the pt if symptoms worse please go to the ED for evaluation between now til 03/20/2019.

## 2019-03-14 NOTE — Telephone Encounter (Signed)
Pt son called and said pt still in pain and wanted to know if Wilfred Lacy would call something else in for her

## 2019-03-14 NOTE — Telephone Encounter (Signed)
Spoke with the pt, she going to call the urology and see if she can get an appt soon.

## 2019-03-14 NOTE — Telephone Encounter (Signed)
[name] please advise, spoke with the pt before and she report oxybutynin didn't really help, she declined an appt with urology when they call her on 03/12/2019. Pt is not better.

## 2019-03-14 NOTE — Telephone Encounter (Signed)
Patient calling again requesting to speak with Wilfred Lacy, NP. She would not disclose any other information. Advised patient that PCP was with patients. She is requesting a call back asap. Please advise.

## 2019-03-14 NOTE — Telephone Encounter (Signed)
Copied from [place] [id number]. Topic: General - Other >> Mar 14, 2019  1:39 PM Rainey Pines A wrote: Patient called to let [facility] know that the patient has an appointment with the urologist Thursday at 1:30

## 2019-03-14 NOTE — Telephone Encounter (Signed)
Unfortunately I do not have another option. I strongly advise her to make appt with urology to further evaluation symptoms.

## 2019-03-25 ENCOUNTER — Ambulatory Visit: Payer: Self-pay | Admitting: Nurse Practitioner

## 2019-04-20 ENCOUNTER — Other Ambulatory Visit: Payer: Self-pay | Admitting: Nurse Practitioner

## 2019-04-20 DIAGNOSIS — F418 Other specified anxiety disorders: Secondary | ICD-10-CM

## 2019-04-21 ENCOUNTER — Telehealth: Payer: Self-pay | Admitting: Nurse Practitioner

## 2019-04-21 NOTE — Telephone Encounter (Signed)
What is the reason for change and is it covered under patient's insurance?

## 2019-04-21 NOTE — Telephone Encounter (Signed)
If covered by her insurance, ok to change. They should notify patient of change

## 2019-04-21 NOTE — Telephone Encounter (Signed)
[name] please advise,  Last refill: 12/24/2018 for 30 tab with 2 refills.  Last OV: 12/24/2018  Next OV: Due in 02/2019 but no appt schedule yet.  PMP checked last 3 pick up for 30 tab at [facility]:  12/25/2018, 02/16/2019, 03/25/2019

## 2019-04-21 NOTE — Telephone Encounter (Signed)
Received message from [name] on [address] request an okey to change manufacture to [name] before they can refill her med. Please advise?

## 2019-04-21 NOTE — Telephone Encounter (Signed)
For better preservation for medication. Old manufacturer had issue with medication being explose to sunlight (If pt leave it out) lead to less effective of medication.

## 2019-04-21 NOTE — Telephone Encounter (Signed)
Response gave to Lauri at [facility]. She also stated they will notify pt and insurance should cover.

## 2019-04-26 ENCOUNTER — Other Ambulatory Visit: Payer: Self-pay | Admitting: Nurse Practitioner

## 2019-04-26 DIAGNOSIS — M792 Neuralgia and neuritis, unspecified: Secondary | ICD-10-CM

## 2019-04-29 ENCOUNTER — Encounter: Payer: Self-pay | Admitting: Nurse Practitioner

## 2019-04-29 ENCOUNTER — Ambulatory Visit (INDEPENDENT_AMBULATORY_CARE_PROVIDER_SITE_OTHER): Payer: Medicare Other | Admitting: Nurse Practitioner

## 2019-04-29 DIAGNOSIS — R350 Frequency of micturition: Secondary | ICD-10-CM | POA: Diagnosis not present

## 2019-04-29 DIAGNOSIS — R3915 Urgency of urination: Secondary | ICD-10-CM

## 2019-04-29 DIAGNOSIS — M792 Neuralgia and neuritis, unspecified: Secondary | ICD-10-CM

## 2019-04-29 DIAGNOSIS — I10 Essential (primary) hypertension: Secondary | ICD-10-CM

## 2019-04-29 DIAGNOSIS — E039 Hypothyroidism, unspecified: Secondary | ICD-10-CM

## 2019-04-29 MED ORDER — PHENAZOPYRIDINE HCL 95 MG PO TABS: 95.0000 mg | ORAL_TABLET | Freq: Every day | ORAL | 0 refills | Status: DC | PRN

## 2019-04-29 MED ORDER — METOPROLOL SUCCINATE ER 25 MG PO TB24: 75.0000 mg | ORAL_TABLET | Freq: Every day | ORAL | 5 refills | Status: DC

## 2019-04-29 MED ORDER — OXYBUTYNIN CHLORIDE ER 5 MG PO TB24: 5.0000 mg | ORAL_TABLET | Freq: Every day | ORAL | 1 refills | Status: DC

## 2019-04-29 MED ORDER — LEVOTHYROXINE SODIUM 75 MCG PO TABS: 75.0000 ug | ORAL_TABLET | Freq: Every day | ORAL | 0 refills | Status: DC

## 2019-04-29 NOTE — Patient Instructions (Addendum)
Continue current medications Schedule with urology when comfortable. Try ditropan at bedtime to help with nocturia. Use pyridium only as needed for dysuria. You will be contacted to schedule 37months f/up appt

## 2019-04-29 NOTE — Progress Notes (Signed)
Virtual Visit via Telephone Note  I connected with [name] on 04/30/19 at  3:30 PM EDT by telephone and verified that I am speaking with the correct person using two identifiers.  Location: Patient: Home Provider: Office   I discussed the limitations, risks, security and privacy concerns of performing an evaluation and management service by telephone and the availability of in person appointments. I also discussed with the patient that there may be a patient responsible charge related to this service. The patient expressed understanding and agreed to proceed.  CC: f/up on HTN, urinary symptoms, and hypothyroidism.  History of Present Illness: HTN: stable with metoprolol  Hypothyroidism: No change in weight, energy level, or LE edema, no sleep. Wt Readings from Last 3 Encounters:  04/29/19 115 lb (52.2 kg)  02/26/19 115 lb (52.2 kg)  12/24/18 115 lb 6.4 oz (52.3 kg)   Urinary urgency, frequency and dysuria: Persistent, worse at bedtime, did not take ditropan as prescribed but will to try at this time. Did not make appt with urology due to fear of exposure to COVID-19. Denies any fever, or diarrhea or constipation or ABD pain or flank pain   Observations/Objective: Alert and oriented x4, normal vice and speech  Assessment and Plan: [name] was seen today for follow-up.  Diagnoses and all orders for this visit:  Hypothyroidism, unspecified type -     levothyroxine (SYNTHROID) 75 MCG tablet; Take 1 tablet (75 mcg total) by mouth daily before breakfast.  Essential hypertension -     metoprolol succinate (TOPROL-XL) 25 MG 24 hr tablet; Take 3 tablets (75 mg total) by mouth daily.  Neuropathic pain  Urinary urgency -     Discontinue: phenazopyridine (PYRIDIUM) 95 MG tablet; Take 1 tablet (95 mg total) by mouth daily as needed for pain. -     oxybutynin (DITROPAN-XL) 5 MG 24 hr tablet; Take 1 tablet (5 mg total) by mouth at bedtime.  Frequent urination -      Discontinue: phenazopyridine (PYRIDIUM) 95 MG tablet; Take 1 tablet (95 mg total) by mouth daily as needed for pain. -     oxybutynin (DITROPAN-XL) 5 MG 24 hr tablet; Take 1 tablet (5 mg total) by mouth at bedtime.    Follow Up Instructions: Continue current medications Schedule with urology when comfortable. Try ditropan at bedtime to help with nocturia. Use pyridium only as needed for dysuria. You will be contacted to schedule 77months f/up appt   I discussed the assessment and treatment plan with the patient. The patient was provided an opportunity to ask questions and all were answered. The patient agreed with the plan and demonstrated an understanding of the instructions.   The patient was advised to call back or seek an in-person evaluation if the symptoms worsen or if the condition fails to improve as anticipated.  I provided 48minutes of non-face-to-face time during this encounter.   Wilfred Lacy, NP

## 2019-04-30 ENCOUNTER — Telehealth: Payer: Self-pay | Admitting: Nurse Practitioner

## 2019-04-30 DIAGNOSIS — R350 Frequency of micturition: Secondary | ICD-10-CM

## 2019-04-30 DIAGNOSIS — R3915 Urgency of urination: Secondary | ICD-10-CM

## 2019-04-30 MED ORDER — PHENAZOPYRIDINE HCL 100 MG PO TABS: 100.0000 mg | ORAL_TABLET | Freq: Every day | ORAL | 0 refills | Status: DC

## 2019-04-30 NOTE — Telephone Encounter (Signed)
Ok to change dose to 100mg 

## 2019-04-30 NOTE — Telephone Encounter (Signed)
[place] stated that Phenazopyridine 95 mg is OTC but the 100 mg is the actual Rx. [name] please advise, do you want her to get OTC or send in new rx for 100 mg?

## 2019-04-30 NOTE — Telephone Encounter (Signed)
New rx sent in, same direction.

## 2019-04-30 NOTE — Telephone Encounter (Signed)
Cletus Gash w/pharmacy called in to be advised. They received a Rx for phenazopyridine (PYRIDIUM) 95 MG tablet  Which is OTC. He would like clarity on if pt is suppose to have a different dose?  CB: [phone]

## 2019-05-08 ENCOUNTER — Telehealth: Payer: Self-pay

## 2019-05-08 NOTE — Telephone Encounter (Signed)
LVM for the pt to call back.

## 2019-05-08 NOTE — Telephone Encounter (Signed)
Copied from [place] [id number]. Topic: General - Other >> May 08, 2019  8:45 AM Keene Breath wrote: Reason for CRM: Patient called to request a call from the doctor's assistant.  Patient stated she has some questions to ask the nurse.  CB# [phone]

## 2019-05-08 NOTE — Telephone Encounter (Addendum)
Pt is calling and the ditropan medication is not working. Pt has an appt with her urologist on 05/19/2019. Pt would like phet to return her call. Pt is still having pain

## 2019-05-08 NOTE — Telephone Encounter (Signed)
Pt is aware, pt will call urology tomorrow to see if she can get in sooner.

## 2019-05-08 NOTE — Telephone Encounter (Signed)
Unfortunately I do not have suggestion on different medication at this time.

## 2019-05-08 NOTE — Telephone Encounter (Signed)
Pt was wondering if [name] can give her something stronger to help with dysuria,burning sensation,frequent urinate until she see the urology on 05/19/2019. Please advise, pt stated it is really over the weekend.

## 2019-05-19 DIAGNOSIS — N131 Hydronephrosis with ureteral stricture, not elsewhere classified: Secondary | ICD-10-CM | POA: Diagnosis not present

## 2019-05-19 DIAGNOSIS — R102 Pelvic and perineal pain: Secondary | ICD-10-CM | POA: Diagnosis not present

## 2019-05-19 DIAGNOSIS — R1904 Left lower quadrant abdominal swelling, mass and lump: Secondary | ICD-10-CM | POA: Diagnosis not present

## 2019-05-19 DIAGNOSIS — R300 Dysuria: Secondary | ICD-10-CM | POA: Diagnosis not present

## 2019-05-19 DIAGNOSIS — N3020 Other chronic cystitis without hematuria: Secondary | ICD-10-CM | POA: Diagnosis not present

## 2019-05-20 LAB — CBC AND DIFFERENTIAL
HCT: 33 — AB (ref 36–46)
Hemoglobin: 10.7 — AB (ref 12.0–16.0)
Platelets: 456 — AB (ref 150–399)
WBC: 10

## 2019-05-22 ENCOUNTER — Encounter: Payer: Self-pay | Admitting: Nurse Practitioner

## 2019-05-22 NOTE — Progress Notes (Signed)
Abstracted result and sent to scan  

## 2019-05-26 DIAGNOSIS — R1110 Vomiting, unspecified: Secondary | ICD-10-CM | POA: Diagnosis not present

## 2019-05-26 DIAGNOSIS — R1904 Left lower quadrant abdominal swelling, mass and lump: Secondary | ICD-10-CM | POA: Diagnosis not present

## 2019-05-28 ENCOUNTER — Encounter: Payer: Self-pay | Admitting: Nurse Practitioner

## 2019-05-28 DIAGNOSIS — N321 Vesicointestinal fistula: Secondary | ICD-10-CM | POA: Insufficient documentation

## 2019-06-02 DIAGNOSIS — N3020 Other chronic cystitis without hematuria: Secondary | ICD-10-CM | POA: Diagnosis not present

## 2019-06-02 DIAGNOSIS — R300 Dysuria: Secondary | ICD-10-CM | POA: Diagnosis not present

## 2019-06-02 DIAGNOSIS — N1330 Unspecified hydronephrosis: Secondary | ICD-10-CM | POA: Diagnosis not present

## 2019-06-03 DIAGNOSIS — H353132 Nonexudative age-related macular degeneration, bilateral, intermediate dry stage: Secondary | ICD-10-CM | POA: Diagnosis not present

## 2019-06-03 DIAGNOSIS — Z961 Presence of intraocular lens: Secondary | ICD-10-CM | POA: Diagnosis not present

## 2019-06-03 DIAGNOSIS — H35433 Paving stone degeneration of retina, bilateral: Secondary | ICD-10-CM | POA: Diagnosis not present

## 2019-06-03 DIAGNOSIS — H35033 Hypertensive retinopathy, bilateral: Secondary | ICD-10-CM | POA: Diagnosis not present

## 2019-06-03 DIAGNOSIS — H524 Presbyopia: Secondary | ICD-10-CM | POA: Diagnosis not present

## 2019-06-24 ENCOUNTER — Other Ambulatory Visit: Payer: Self-pay | Admitting: Nurse Practitioner

## 2019-06-24 DIAGNOSIS — F418 Other specified anxiety disorders: Secondary | ICD-10-CM

## 2019-06-30 ENCOUNTER — Other Ambulatory Visit: Payer: Self-pay

## 2019-06-30 ENCOUNTER — Ambulatory Visit (INDEPENDENT_AMBULATORY_CARE_PROVIDER_SITE_OTHER): Payer: Medicare Other | Admitting: Nurse Practitioner

## 2019-06-30 ENCOUNTER — Encounter: Payer: Self-pay | Admitting: Nurse Practitioner

## 2019-06-30 VITALS — BP 135/67 | HR 73 | Ht 60.0 in | Wt 120.0 lb

## 2019-06-30 DIAGNOSIS — D473 Essential (hemorrhagic) thrombocythemia: Secondary | ICD-10-CM

## 2019-06-30 DIAGNOSIS — I10 Essential (primary) hypertension: Secondary | ICD-10-CM

## 2019-06-30 DIAGNOSIS — D649 Anemia, unspecified: Secondary | ICD-10-CM

## 2019-06-30 DIAGNOSIS — N182 Chronic kidney disease, stage 2 (mild): Secondary | ICD-10-CM

## 2019-06-30 DIAGNOSIS — R7301 Impaired fasting glucose: Secondary | ICD-10-CM

## 2019-06-30 DIAGNOSIS — E039 Hypothyroidism, unspecified: Secondary | ICD-10-CM

## 2019-06-30 DIAGNOSIS — D75839 Thrombocytosis, unspecified: Secondary | ICD-10-CM

## 2019-06-30 DIAGNOSIS — E782 Mixed hyperlipidemia: Secondary | ICD-10-CM

## 2019-06-30 DIAGNOSIS — M792 Neuralgia and neuritis, unspecified: Secondary | ICD-10-CM

## 2019-06-30 MED ORDER — GABAPENTIN 100 MG PO CAPS: 100.0000 mg | ORAL_CAPSULE | Freq: Every day | ORAL | 3 refills | Status: DC

## 2019-06-30 MED ORDER — LEVOTHYROXINE SODIUM 75 MCG PO TABS: 75.0000 ug | ORAL_TABLET | Freq: Every day | ORAL | 0 refills | Status: DC

## 2019-06-30 MED ORDER — METOPROLOL SUCCINATE ER 25 MG PO TB24: 75.0000 mg | ORAL_TABLET | Freq: Every day | ORAL | 11 refills | Status: DC

## 2019-06-30 NOTE — Progress Notes (Signed)
Virtual Visit via Telephone Note  I connected with [name] on 06/30/19 at 10:30 AM EDT by telephone and verified that I am speaking with the correct person using two identifiers.  Location: Patient: home with son Provider: office   I discussed the limitations, risks, security and privacy concerns of performing an evaluation and management service by telephone and the availability of in person appointments. I also discussed with the patient that there may be a patient responsible charge related to this service. The patient expressed understanding and agreed to proceed.  CC: discuss medication dose and f/up on chronic conditions  History of Present Illness: Neuropathy: Chronic, stable, minimal improvement with gabapentin 100mg  at HS. She refuses to increase HS dose or spreading dose to BID.  Does not want to see neurology. Does not want to different medication.  HTN with stage 3 CKD: BP at goal with metoprolol BP Readings from Last 3 Encounters:  06/30/19 135/67  04/29/19 (!) 115/54  12/24/18 130/72   Hypothyroidism: Stable weight, BM and mood. Current dose at 38mcg. Wt Readings from Last 3 Encounters:  06/30/19 120 lb (54.4 kg)  04/29/19 115 lb (52.2 kg)  02/26/19 115 lb (52.2 kg)   Hyperlipidemia with aortic stenosis: Denies to take any statin due to fear of possible side effects Lipid Panel     Component Value Date/Time   CHOL 240 (H) 07/29/2015 1501   TRIG 220.0 (H) 07/29/2015 1501   HDL 72.50 07/29/2015 1501   CHOLHDL 3 07/29/2015 1501   VLDL 44.0 (H) 07/29/2015 1501   LDLCALC 80 09/30/2012 0935   LDLDIRECT 146.0 07/29/2015 1501   Observations/Objective: Alert and oriented x 4, clear voice and normal speech. No physical exam due to telephone call  Assessment and Plan: [name] was seen today for follow-up.  Diagnoses and all orders for this visit:  Neuropathic pain -     gabapentin (NEURONTIN) 100 MG capsule; Take 1 capsule (100 mg total) by mouth  at bedtime.  Essential hypertension -     Basic metabolic panel; Future -     metoprolol succinate (TOPROL-XL) 25 MG 24 hr tablet; Take 3 tablets (75 mg total) by mouth daily.  Hypothyroidism, unspecified type -     TSH; Future -     T4, free; Future -     levothyroxine (SYNTHROID) 75 MCG tablet; Take 1 tablet (75 mcg total) by mouth daily before breakfast. Need lab visit for additional refills  Impaired fasting glucose -     Hemoglobin A1c; Future  CKD (chronic kidney disease) stage 2, GFR 60-89 ml/min -     Basic metabolic panel; Future  Thrombocytosis (HCC) -     CBC w/Diff; Future  Mixed hyperlipidemia -     Hepatic function panel; Future -     Lipid panel; Future  Normocytic normochromic anemia -     CBC w/Diff; Future   Follow Up Instructions: See avs   I discussed the assessment and treatment plan with the patient. The patient was provided an opportunity to ask questions and all were answered. The patient agreed with the plan and demonstrated an understanding of the instructions.   The patient was advised to call back or seek an in-person evaluation if the symptoms worsen or if the condition fails to improve as anticipated.  I provided 12 minutes of non-face-to-face time during this encounter.   Wilfred Lacy, NP

## 2019-06-30 NOTE — Patient Instructions (Addendum)
Maintain current dose of alprazolam and gabapentin for now. Call office to schedule lab appt. Need to be fasting prior to blood draw.

## 2019-07-03 ENCOUNTER — Other Ambulatory Visit: Payer: Self-pay

## 2019-07-03 ENCOUNTER — Other Ambulatory Visit: Payer: Medicare Other

## 2019-07-03 DIAGNOSIS — R7301 Impaired fasting glucose: Secondary | ICD-10-CM

## 2019-07-03 DIAGNOSIS — N182 Chronic kidney disease, stage 2 (mild): Secondary | ICD-10-CM

## 2019-07-03 DIAGNOSIS — D473 Essential (hemorrhagic) thrombocythemia: Secondary | ICD-10-CM | POA: Diagnosis not present

## 2019-07-03 DIAGNOSIS — D649 Anemia, unspecified: Secondary | ICD-10-CM

## 2019-07-03 DIAGNOSIS — I10 Essential (primary) hypertension: Secondary | ICD-10-CM

## 2019-07-03 DIAGNOSIS — E782 Mixed hyperlipidemia: Secondary | ICD-10-CM | POA: Diagnosis not present

## 2019-07-03 DIAGNOSIS — E039 Hypothyroidism, unspecified: Secondary | ICD-10-CM

## 2019-07-03 DIAGNOSIS — D75839 Thrombocytosis, unspecified: Secondary | ICD-10-CM

## 2019-07-05 LAB — LIPID PANEL
Cholesterol: 155 mg/dL (ref <200)
HDL: 51 mg/dL (ref >=50)
LDL Cholesterol (Calc): 80 mg/dL (calc)
Non-HDL Cholesterol (Calc): 104 mg/dL (calc) (ref <130)
Total CHOL/HDL Ratio: 3 (calc) (ref <5.0)
Triglycerides: 143 mg/dL (ref <150)

## 2019-07-05 LAB — HEPATIC FUNCTION PANEL
AG Ratio: 0.9 (calc) — ABNORMAL LOW (ref 1.0–2.5)
ALT: 8 U/L (ref 6–29)
AST: 18 U/L (ref 10–35)
Albumin: 3.4 g/dL — ABNORMAL LOW (ref 3.6–5.1)
Alkaline phosphatase (APISO): 99 U/L (ref 37–153)
Bilirubin, Direct: 0.1 mg/dL (ref 0.0–0.2)
Globulin: 3.6 g/dL (calc) (ref 1.9–3.7)
Indirect Bilirubin: 0.4 mg/dL (calc) (ref 0.2–1.2)
Total Bilirubin: 0.5 mg/dL (ref 0.2–1.2)
Total Protein: 7 g/dL (ref 6.1–8.1)

## 2019-07-05 LAB — CBC WITH DIFFERENTIAL/PLATELET
Absolute Monocytes: 733 cells/uL (ref 200–950)
Basophils Absolute: 56 cells/uL (ref 0–200)
Basophils Relative: 0.5 %
Eosinophils Absolute: 200 cells/uL (ref 15–500)
Eosinophils Relative: 1.8 %
HCT: 31.1 % — ABNORMAL LOW (ref 35.0–45.0)
Hemoglobin: 10 g/dL — ABNORMAL LOW (ref 11.7–15.5)
Lymphs Abs: 3419 cells/uL (ref 850–3900)
MCH: 28.3 pg (ref 27.0–33.0)
MCHC: 32.2 g/dL (ref 32.0–36.0)
MCV: 88.1 fL (ref 80.0–100.0)
MPV: 9 fL (ref 7.5–12.5)
Monocytes Relative: 6.6 %
Neutro Abs: 6693 cells/uL (ref 1500–7800)
Neutrophils Relative %: 60.3 %
Platelets: 341 10*3/uL (ref 140–400)
RBC: 3.53 10*6/uL — ABNORMAL LOW (ref 3.80–5.10)
RDW: 14.5 % (ref 11.0–15.0)
Total Lymphocyte: 30.8 %
WBC: 11.1 10*3/uL — ABNORMAL HIGH (ref 3.8–10.8)

## 2019-07-05 LAB — BASIC METABOLIC PANEL
BUN/Creatinine Ratio: 18 (calc) (ref 6–22)
BUN: 19 mg/dL (ref 7–25)
CO2: 21 mmol/L (ref 20–32)
Calcium: 9.2 mg/dL (ref 8.6–10.4)
Chloride: 105 mmol/L (ref 98–110)
Creat: 1.07 mg/dL — ABNORMAL HIGH (ref 0.60–0.88)
Glucose, Bld: 100 mg/dL — ABNORMAL HIGH (ref 65–99)
Potassium: 4.3 mmol/L (ref 3.5–5.3)
Sodium: 140 mmol/L (ref 135–146)

## 2019-07-05 LAB — HEMOGLOBIN A1C
Hgb A1c MFr Bld: 5.8 % of total Hgb — ABNORMAL HIGH (ref <5.7)
Mean Plasma Glucose: 120 (calc)
eAG (mmol/L): 6.6 (calc)

## 2019-07-05 LAB — TSH: TSH: 1.02 mIU/L (ref 0.40–4.50)

## 2019-07-05 LAB — T4, FREE: Free T4: 1.2 ng/dL (ref 0.8–1.8)

## 2019-07-05 MED ORDER — LEVOTHYROXINE SODIUM 75 MCG PO TABS: 75.0000 ug | ORAL_TABLET | Freq: Every day | ORAL | 1 refills | Status: DC

## 2019-07-09 DIAGNOSIS — N131 Hydronephrosis with ureteral stricture, not elsewhere classified: Secondary | ICD-10-CM | POA: Diagnosis not present

## 2019-07-09 DIAGNOSIS — N1330 Unspecified hydronephrosis: Secondary | ICD-10-CM | POA: Diagnosis not present

## 2019-07-09 DIAGNOSIS — N3020 Other chronic cystitis without hematuria: Secondary | ICD-10-CM | POA: Diagnosis not present

## 2019-07-09 DIAGNOSIS — R300 Dysuria: Secondary | ICD-10-CM | POA: Diagnosis not present

## 2019-07-21 DIAGNOSIS — L602 Onychogryphosis: Secondary | ICD-10-CM | POA: Diagnosis not present

## 2019-07-21 DIAGNOSIS — M21612 Bunion of left foot: Secondary | ICD-10-CM | POA: Diagnosis not present

## 2019-07-21 DIAGNOSIS — M21611 Bunion of right foot: Secondary | ICD-10-CM | POA: Diagnosis not present

## 2019-07-21 DIAGNOSIS — I739 Peripheral vascular disease, unspecified: Secondary | ICD-10-CM | POA: Diagnosis not present

## 2019-07-23 ENCOUNTER — Other Ambulatory Visit: Payer: Self-pay | Admitting: Nurse Practitioner

## 2019-07-23 DIAGNOSIS — F418 Other specified anxiety disorders: Secondary | ICD-10-CM

## 2019-07-23 NOTE — Telephone Encounter (Signed)
[name] please advise, last ov for this problem was 10/2018 but last ov with you were 06/30/2019. Last 3 PMP checked pick up at [facility] for 30 tab were: 04/25/2019, 05/24/2019 and 06/25/2019.

## 2019-08-21 ENCOUNTER — Inpatient Hospital Stay (HOSPITAL_COMMUNITY)
Admission: EM | Admit: 2019-08-21 | Discharge: 2019-08-24 | DRG: 872 | Disposition: A | Discharge status: Home / Self Care | Payer: Medicare Other | Attending: Internal Medicine | Admitting: Internal Medicine

## 2019-08-21 ENCOUNTER — Emergency Department (HOSPITAL_COMMUNITY): Payer: Medicare Other

## 2019-08-21 ENCOUNTER — Other Ambulatory Visit: Payer: Self-pay

## 2019-08-21 DIAGNOSIS — Z9842 Cataract extraction status, left eye: Secondary | ICD-10-CM | POA: Diagnosis not present

## 2019-08-21 DIAGNOSIS — Z888 Allergy status to other drugs, medicaments and biological substances status: Secondary | ICD-10-CM

## 2019-08-21 DIAGNOSIS — E785 Hyperlipidemia, unspecified: Secondary | ICD-10-CM | POA: Diagnosis present

## 2019-08-21 DIAGNOSIS — A4151 Sepsis due to Escherichia coli [E. coli]: Principal | ICD-10-CM | POA: Diagnosis present

## 2019-08-21 DIAGNOSIS — R5381 Other malaise: Secondary | ICD-10-CM | POA: Diagnosis present

## 2019-08-21 DIAGNOSIS — I350 Nonrheumatic aortic (valve) stenosis: Secondary | ICD-10-CM | POA: Diagnosis present

## 2019-08-21 DIAGNOSIS — A419 Sepsis, unspecified organism: Secondary | ICD-10-CM | POA: Diagnosis not present

## 2019-08-21 DIAGNOSIS — Z209 Contact with and (suspected) exposure to unspecified communicable disease: Secondary | ICD-10-CM | POA: Diagnosis not present

## 2019-08-21 DIAGNOSIS — I482 Chronic atrial fibrillation, unspecified: Secondary | ICD-10-CM | POA: Diagnosis present

## 2019-08-21 DIAGNOSIS — Z79890 Hormone replacement therapy (postmenopausal): Secondary | ICD-10-CM | POA: Diagnosis not present

## 2019-08-21 DIAGNOSIS — I129 Hypertensive chronic kidney disease with stage 1 through stage 4 chronic kidney disease, or unspecified chronic kidney disease: Secondary | ICD-10-CM | POA: Diagnosis present

## 2019-08-21 DIAGNOSIS — R0689 Other abnormalities of breathing: Secondary | ICD-10-CM | POA: Diagnosis not present

## 2019-08-21 DIAGNOSIS — N182 Chronic kidney disease, stage 2 (mild): Secondary | ICD-10-CM | POA: Diagnosis present

## 2019-08-21 DIAGNOSIS — Z7982 Long term (current) use of aspirin: Secondary | ICD-10-CM | POA: Diagnosis not present

## 2019-08-21 DIAGNOSIS — Z8249 Family history of ischemic heart disease and other diseases of the circulatory system: Secondary | ICD-10-CM | POA: Diagnosis not present

## 2019-08-21 DIAGNOSIS — N390 Urinary tract infection, site not specified: Secondary | ICD-10-CM | POA: Diagnosis present

## 2019-08-21 DIAGNOSIS — M7918 Myalgia, other site: Secondary | ICD-10-CM | POA: Diagnosis present

## 2019-08-21 DIAGNOSIS — R0600 Dyspnea, unspecified: Secondary | ICD-10-CM

## 2019-08-21 DIAGNOSIS — Z87891 Personal history of nicotine dependence: Secondary | ICD-10-CM

## 2019-08-21 DIAGNOSIS — D509 Iron deficiency anemia, unspecified: Secondary | ICD-10-CM | POA: Diagnosis present

## 2019-08-21 DIAGNOSIS — Z20828 Contact with and (suspected) exposure to other viral communicable diseases: Secondary | ICD-10-CM | POA: Diagnosis present

## 2019-08-21 DIAGNOSIS — N3001 Acute cystitis with hematuria: Secondary | ICD-10-CM | POA: Diagnosis not present

## 2019-08-21 DIAGNOSIS — R509 Fever, unspecified: Secondary | ICD-10-CM | POA: Diagnosis not present

## 2019-08-21 DIAGNOSIS — R0602 Shortness of breath: Secondary | ICD-10-CM | POA: Diagnosis not present

## 2019-08-21 DIAGNOSIS — E039 Hypothyroidism, unspecified: Secondary | ICD-10-CM | POA: Diagnosis present

## 2019-08-21 DIAGNOSIS — N183 Chronic kidney disease, stage 3 unspecified: Secondary | ICD-10-CM | POA: Diagnosis present

## 2019-08-21 DIAGNOSIS — Z885 Allergy status to narcotic agent status: Secondary | ICD-10-CM | POA: Diagnosis not present

## 2019-08-21 DIAGNOSIS — I10 Essential (primary) hypertension: Secondary | ICD-10-CM | POA: Diagnosis not present

## 2019-08-21 DIAGNOSIS — M0579 Rheumatoid arthritis with rheumatoid factor of multiple sites without organ or systems involvement: Secondary | ICD-10-CM | POA: Diagnosis not present

## 2019-08-21 DIAGNOSIS — M069 Rheumatoid arthritis, unspecified: Secondary | ICD-10-CM | POA: Diagnosis present

## 2019-08-21 DIAGNOSIS — E782 Mixed hyperlipidemia: Secondary | ICD-10-CM | POA: Diagnosis not present

## 2019-08-21 DIAGNOSIS — R000 Tachycardia, unspecified: Secondary | ICD-10-CM | POA: Diagnosis not present

## 2019-08-21 DIAGNOSIS — Z9841 Cataract extraction status, right eye: Secondary | ICD-10-CM

## 2019-08-21 DIAGNOSIS — Z825 Family history of asthma and other chronic lower respiratory diseases: Secondary | ICD-10-CM

## 2019-08-21 DIAGNOSIS — F419 Anxiety disorder, unspecified: Secondary | ICD-10-CM | POA: Diagnosis present

## 2019-08-21 DIAGNOSIS — Z880 Allergy status to penicillin: Secondary | ICD-10-CM

## 2019-08-21 DIAGNOSIS — I4891 Unspecified atrial fibrillation: Secondary | ICD-10-CM | POA: Diagnosis not present

## 2019-08-21 DIAGNOSIS — I4819 Other persistent atrial fibrillation: Secondary | ICD-10-CM | POA: Diagnosis present

## 2019-08-21 DIAGNOSIS — Z79899 Other long term (current) drug therapy: Secondary | ICD-10-CM

## 2019-08-21 LAB — CBC WITH DIFFERENTIAL/PLATELET
Abs Immature Granulocytes: 0.09 10*3/uL — ABNORMAL HIGH (ref 0.00–0.07)
Basophils Absolute: 0.1 10*3/uL (ref 0.0–0.1)
Basophils Relative: 0 %
Eosinophils Absolute: 0 10*3/uL (ref 0.0–0.5)
Eosinophils Relative: 0 %
HCT: 35.8 % — ABNORMAL LOW (ref 36.0–46.0)
Hemoglobin: 10.8 g/dL — ABNORMAL LOW (ref 12.0–15.0)
Immature Granulocytes: 1 %
Lymphocytes Relative: 9 %
Lymphs Abs: 1.6 10*3/uL (ref 0.7–4.0)
MCH: 28 pg (ref 26.0–34.0)
MCHC: 30.2 g/dL (ref 30.0–36.0)
MCV: 92.7 fL (ref 80.0–100.0)
Monocytes Absolute: 1.2 10*3/uL — ABNORMAL HIGH (ref 0.1–1.0)
Monocytes Relative: 7 %
Neutro Abs: 15.2 10*3/uL — ABNORMAL HIGH (ref 1.7–7.7)
Neutrophils Relative %: 83 %
Platelets: 248 10*3/uL (ref 150–400)
RBC: 3.86 MIL/uL — ABNORMAL LOW (ref 3.87–5.11)
RDW: 16.4 % — ABNORMAL HIGH (ref 11.5–15.5)
WBC: 18.2 10*3/uL — ABNORMAL HIGH (ref 4.0–10.5)
nRBC: 0 % (ref 0.0–0.2)

## 2019-08-21 LAB — URINALYSIS, ROUTINE W REFLEX MICROSCOPIC
Bilirubin Urine: NEGATIVE
Glucose, UA: NEGATIVE mg/dL
Ketones, ur: NEGATIVE mg/dL
Nitrite: POSITIVE — AB
Protein, ur: 100 mg/dL — AB
RBC / HPF: >50 RBC/hpf — ABNORMAL HIGH (ref 0–5)
Specific Gravity, Urine: 1.013 (ref 1.005–1.030)
WBC, UA: >50 WBC/hpf — ABNORMAL HIGH (ref 0–5)
pH: 5 (ref 5.0–8.0)

## 2019-08-21 LAB — COMPREHENSIVE METABOLIC PANEL
ALT: 21 U/L (ref 0–44)
AST: 34 U/L (ref 15–41)
Albumin: 3.2 g/dL — ABNORMAL LOW (ref 3.5–5.0)
Alkaline Phosphatase: 104 U/L (ref 38–126)
Anion gap: 12 (ref 5–15)
BUN: 14 mg/dL (ref 8–23)
CO2: 21 mmol/L — ABNORMAL LOW (ref 22–32)
Calcium: 8.6 mg/dL — ABNORMAL LOW (ref 8.9–10.3)
Chloride: 101 mmol/L (ref 98–111)
Creatinine, Ser: 1.07 mg/dL — ABNORMAL HIGH (ref 0.44–1.00)
GFR calc Af Amer: 53 mL/min — ABNORMAL LOW (ref >60)
GFR calc non Af Amer: 46 mL/min — ABNORMAL LOW (ref >60)
Glucose, Bld: 136 mg/dL — ABNORMAL HIGH (ref 70–99)
Potassium: 3.6 mmol/L (ref 3.5–5.1)
Sodium: 134 mmol/L — ABNORMAL LOW (ref 135–145)
Total Bilirubin: 1.2 mg/dL (ref 0.3–1.2)
Total Protein: 7.5 g/dL (ref 6.5–8.1)

## 2019-08-21 LAB — PROTIME-INR
INR: 1.1 (ref 0.8–1.2)
Prothrombin Time: 14.4 seconds (ref 11.4–15.2)

## 2019-08-21 LAB — LACTIC ACID, PLASMA
Lactic Acid, Venous: 1.6 mmol/L (ref 0.5–1.9)
Lactic Acid, Venous: 1.9 mmol/L (ref 0.5–1.9)

## 2019-08-21 LAB — APTT: aPTT: 27 seconds (ref 24–36)

## 2019-08-21 MED ORDER — METOPROLOL TARTRATE 5 MG/5ML IV SOLN
5.0000 mg | Freq: Once | INTRAVENOUS | Status: AC
  Administered 2019-08-21: 5 mg via INTRAVENOUS
  Filled 2019-08-21: qty 5

## 2019-08-21 MED ORDER — SODIUM CHLORIDE 0.9 % IV BOLUS
1000.0000 mL | Freq: Once | INTRAVENOUS | Status: AC
  Administered 2019-08-21: 1000 mL via INTRAVENOUS

## 2019-08-21 MED ORDER — SODIUM CHLORIDE 0.9 % IV BOLUS: 1000.0000 mL | Freq: Once | INTRAVENOUS | Status: DC

## 2019-08-21 MED ORDER — SODIUM CHLORIDE 0.9 % IV SOLN
1.0000 g | INTRAVENOUS | Status: AC
  Administered 2019-08-21: 1 g via INTRAVENOUS
  Filled 2019-08-21: qty 10

## 2019-08-21 MED ORDER — ACETAMINOPHEN 325 MG PO TABS
650.0000 mg | ORAL_TABLET | Freq: Once | ORAL | Status: AC
  Administered 2019-08-21: 650 mg via ORAL
  Filled 2019-08-21: qty 2

## 2019-08-21 NOTE — ED Provider Notes (Signed)
[facility] DEPT Provider Note   CSN: [id number] Arrival date & time: 08/21/19  1718     History   Chief Complaint No chief complaint on file.   HPI [name] is a 85 y.o. female.     Patient complains of fever shortness of breath dysuria  The history is provided by the patient. No language interpreter was used.  Fever Max temp prior to arrival:  101 Temp source:  Oral Severity:  Moderate Onset quality:  Sudden Timing:  Constant Progression:  Waxing and waning Chronicity:  New Relieved by:  Nothing Worsened by:  Nothing Associated symptoms: dysuria   Associated symptoms: no chest pain, no congestion, no cough, no diarrhea, no headaches and no rash     Past Medical History:  Diagnosis Date   A-fib ([place])    Aortic stenosis, moderate    last echo in 2010; AVA 1.1; mild AS per echo in June 2013   Edema of both legs    s/p laser treatment per Dr. Donnetta Hutching   Hiatal hernia    History of chicken pox    HLD (hyperlipidemia)    HTN (hypertension)    Hypothyroidism    IBS (irritable bowel syndrome)    Iron deficiency anemia    Neuropathic pain 09/06/2015   Rheumatoid arthritis ([place])     Patient Active Problem List   Diagnosis Date Noted   Colovesical fistula 05/28/2019   Polyneuropathy 11/06/2018   Goals of care, counseling/discussion    Hydronephrosis 10/13/2018   Ovarian mass 10/13/2018   C. difficile colitis 10/13/2018   Clostridium difficile colitis 09/24/2018   Hypokalemia    Hypomagnesemia    Left ovarian cyst    Acute diverticulitis 09/07/2018   Atrial fibrillation with rapid ventricular response ([place]) 09/06/2018   Leucocytosis 09/06/2018   A-fib ([place]) 09/06/2018   Malnutrition of moderate degree 07/26/2018   Perforation of sigmoid colon due to diverticulitis 07/25/2018   Thrombocytosis ([place]) 07/25/2018   Sepsis ([place]) 07/24/2018   Generalized abdominal pain 02/06/2018    Paresthesia of both lower extremities 02/06/2018   CKD (chronic kidney disease) stage 2, GFR 60-89 ml/min 01/07/2018   Normocytic normochromic anemia 01/07/2018   Hyperglycemia 01/07/2018   Iron deficiency 09/19/2017   Pyoderma gangrenosum 01/04/2017   Primary osteoarthritis of both hands 01/04/2017   Primary osteoarthritis of both feet 01/04/2017   Primary osteoarthritis of both knees 01/04/2017   Degenerative joint disease involving multiple joints 01/04/2017   Neuropathic pain 09/06/2015   Erythema nodosum 09/21/2014   Rheumatoid arthritis ([place]) 09/21/2014   Essential hypertension 09/02/2014   Rectal bleeding 05/15/2014   Atrial fibrillation, chronic 05/11/2014   HLD (hyperlipidemia) 04/02/2014   Hypothyroidism 04/02/2014   PAC (premature atrial contraction) 01/23/2014   Atrial tachycardia, paroxysmal ([place]) 01/23/2014   Near syncope 01/22/2014   Varicose veins of lower extremities with other complications [id number]   Localized edema 06/21/2012   Venous insufficiency of both lower extremities 04/19/2012   Leg edema 01/17/2012   Anxiety 11/05/2011   Aortic stenosis 03/22/2011    Past Surgical History:  Procedure Laterality Date   APPENDECTOMY  1955   CARPAL TUNNEL RELEASE Right    CATARACT EXTRACTION     ENDOSCOPIC VEIN LASER TREATMENT     ENDOVENOUS ABLATION SAPHENOUS VEIN W/ LASER  08-29-2012   left greater saphenous vein   Curt Jews MD   ENDOVENOUS ABLATION SAPHENOUS VEIN W/ LASER  09-19-2012   right greater saphenous vein by Curt Jews MD  EYE SURGERY  2005   Bilateral cataract   FOOT SURGERY  2003   bilateral Hammer Toe   stab phlebectomy Right 01-02-2013   10-15 incisions right thigh and calf by Curt Jews MD     OB History   No obstetric history on file.      Home Medications    Prior to Admission medications   Medication Sig Start Date End Date Taking? Authorizing Provider  acetaminophen (TYLENOL) 500 MG tablet  Take 1,000 mg by mouth every 8 (eight) hours as needed (joint pain).     [provider]  ALPRAZolam Duanne Moron) 0.25 MG tablet Take 1 tablet (0.25 mg total) by mouth daily as needed for anxiety. 07/26/19   Nche, Charlene Brooke, NP  aspirin 81 MG chewable tablet Chew 1 tablet (81 mg total) by mouth daily. 10/26/18   Hennie Duos, MD  doxycycline (VIBRA-TABS) 100 MG tablet Take 100 mg by mouth at bedtime. 06/09/19   [provider]  gabapentin (NEURONTIN) 100 MG capsule Take 1 capsule (100 mg total) by mouth at bedtime. 06/30/19   Nche, Charlene Brooke, NP  levothyroxine (SYNTHROID) 75 MCG tablet Take 1 tablet (75 mcg total) by mouth daily before breakfast. 07/05/19   Nche, Charlene Brooke, NP  metoprolol succinate (TOPROL-XL) 25 MG 24 hr tablet Take 3 tablets (75 mg total) by mouth daily. 06/30/19   Nche, Charlene Brooke, NP  Multiple Vitamin (MULTIVITAMIN WITH MINERALS) TABS tablet Take 1 tablet by mouth daily. 09/27/18   Raiford Noble Latif, DO  Olopatadine HCl 0.2 % SOLN Place 1 drop into both eyes daily. 10/26/18   Hennie Duos, MD    Family History Family History  Problem Relation Age of Onset   Heart disease Mother    Peripheral vascular disease Mother    Heart disease Father    Peripheral vascular disease Father        Right leg amputation   COPD Father    Heart disease Brother 48       Heart Disease before age 7   Heart attack Brother    Peripheral vascular disease Other    Hypertension Sister     Social History Social History   Tobacco Use   Smoking status: Former Smoker    Types: Cigarettes    Quit date: 10/31/1983    Years since quitting: 35.8   Smokeless tobacco: Never Used  Substance Use Topics   Alcohol use: No   Drug use: No     Allergies   Infliximab, Aspirin, Carvedilol, Codeine, and Penicillins   Review of Systems Review of Systems  Constitutional: Positive for fever. Negative for appetite change and fatigue.  HENT: Negative  for congestion, ear discharge and sinus pressure.   Eyes: Negative for discharge.  Respiratory: Positive for shortness of breath. Negative for cough.   Cardiovascular: Negative for chest pain.  Gastrointestinal: Negative for abdominal pain and diarrhea.  Genitourinary: Positive for dysuria. Negative for frequency and hematuria.  Musculoskeletal: Negative for back pain.  Skin: Negative for rash.  Neurological: Negative for seizures and headaches.  Psychiatric/Behavioral: Negative for hallucinations.     Physical Exam Updated Vital Signs BP 138/79    Pulse (!) 119    Temp 99.2 F (37.3 C) (Oral)    Resp 18    Ht 5' (1.524 m)    Wt 47.6 kg    SpO2 98%    BMI 20.51 kg/m   Physical Exam Vitals signs and nursing note reviewed.  Constitutional:  Appearance: She is well-developed.  HENT:     Head: Normocephalic.     Nose: Nose normal.  Eyes:     General: No scleral icterus.    Conjunctiva/sclera: Conjunctivae normal.  Neck:     Musculoskeletal: Neck supple.     Thyroid: No thyromegaly.  Cardiovascular:     Heart sounds: No murmur. No friction rub. No gallop.      Comments: Tachycardia and irregular heart beat Pulmonary:     Breath sounds: No stridor. No wheezing or rales.  Chest:     Chest wall: No tenderness.  Abdominal:     General: There is no distension.     Tenderness: There is no abdominal tenderness. There is no rebound.  Musculoskeletal: Normal range of motion.  Lymphadenopathy:     Cervical: No cervical adenopathy.  Skin:    Findings: No erythema or rash.  Neurological:     Mental Status: She is oriented to person, place, and time.     Motor: No abnormal muscle tone.     Coordination: Coordination normal.  Psychiatric:        Behavior: Behavior normal.      ED Treatments / Results  Labs (all labs ordered are listed, but only abnormal results are displayed) Labs Reviewed  COMPREHENSIVE METABOLIC PANEL - Abnormal; Notable for the following components:        Result Value   Sodium 134 (*)    CO2 21 (*)    Glucose, Bld 136 (*)    Creatinine, Ser 1.07 (*)    Calcium 8.6 (*)    Albumin 3.2 (*)    GFR calc non Af Amer 46 (*)    GFR calc Af Amer 53 (*)    All other components within normal limits  CBC WITH DIFFERENTIAL/PLATELET - Abnormal; Notable for the following components:   WBC 18.2 (*)    RBC 3.86 (*)    Hemoglobin 10.8 (*)    HCT 35.8 (*)    RDW 16.4 (*)    Neutro Abs 15.2 (*)    Monocytes Absolute 1.2 (*)    Abs Immature Granulocytes 0.09 (*)    All other components within normal limits  URINALYSIS, ROUTINE W REFLEX MICROSCOPIC - Abnormal; Notable for the following components:   APPearance TURBID (*)    Hgb urine dipstick MODERATE (*)    Protein, ur 100 (*)    Nitrite POSITIVE (*)    Leukocytes,Ua LARGE (*)    RBC / HPF >50 (*)    WBC, UA >50 (*)    Bacteria, UA MANY (*)    All other components within normal limits  CULTURE, BLOOD (ROUTINE X 2)  CULTURE, BLOOD (ROUTINE X 2)  URINE CULTURE  SARS CORONAVIRUS 2 (TAT 6-24 HRS)  LACTIC ACID, PLASMA  APTT  PROTIME-INR  LACTIC ACID, PLASMA    EKG EKG Interpretation  Date/Time:  Thursday August 21 2019 17:39:32 EDT Ventricular Rate:  144 PR Interval:    QRS Duration: 72 QT Interval:  250 QTC Calculation: 387 R Axis:   41 Text Interpretation:  Atrial fibrillation with rapid V-rate Probable anteroseptal infarct, old Repolarization abnormality, prob rate related Confirmed by Milton Ferguson [phone]) on 08/21/2019 9:40:31 PM   Radiology Dg Chest Port 1 View  Result Date: 08/21/2019 CLINICAL DATA:  Pt from home. Pt's son called EMS because has had SOB and fever for 2 days. Pt c/o of burning with urination. PT HX: ex smoker, HTN EXAM: PORTABLE CHEST 1 VIEW COMPARISON:  10/13/2018  FINDINGS: The heart is enlarged and stable in configuration. There is atherosclerotic calcification of the thoracic aorta. The lungs are free of focal consolidations and pleural effusions. Large  hiatal hernia. IMPRESSION: 1. Stable cardiomegaly. 2. Large hiatal hernia. 3.  Aortic atherosclerosis.  (ICD10-I70.0) Electronically Signed   By: Nolon Nations M.D.   On: 08/21/2019 18:33    Procedures Procedures (including critical care time)  Medications Ordered in ED Medications  sodium chloride 0.9 % bolus 1,000 mL (0 mLs Intravenous Stopped 08/21/19 2057)  acetaminophen (TYLENOL) tablet 650 mg (650 mg Oral Given 08/21/19 1843)  cefTRIAXone (ROCEPHIN) 1 g in sodium chloride 0.9 % 100 mL IVPB (0 g Intravenous Stopped 08/21/19 2137)     Initial Impression / Assessment and Plan / ED Course  I have reviewed the triage vital signs and the nursing notes.  Pertinent labs & imaging results that were available during my care of the patient were reviewed by me and considered in my medical decision making (see chart for details).        CRITICAL CARE Performed by: Milton Ferguson Total critical care time: 45 minutes Critical care time was exclusive of separately billable procedures and treating other patients. Critical care was necessary to treat or prevent imminent or life-threatening deterioration. Critical care was time spent personally by me on the following activities: development of treatment plan with patient and/or surrogate as well as nursing, discussions with consultants, evaluation of patient's response to treatment, examination of patient, obtaining history from patient or surrogate, ordering and performing treatments and interventions, ordering and review of laboratory studies, ordering and review of radiographic studies, pulse oximetry and re-evaluation of patient's condition. Patient will be admitted for urinary tract infection and shortness of breath Final Clinical Impressions(s) / ED Diagnoses   Final diagnoses:  Acute cystitis with hematuria    ED Discharge Orders    None       Milton Ferguson, MD 08/21/19 2153

## 2019-08-21 NOTE — ED Triage Notes (Signed)
Pt from home.  Pt's son called EMS bc has had SOB and fever for 2 days.  Pt A&Ox4.  Pt c/o of burning with urination.  O2 98% RR 28 156/98 140 in A-fib.  Hx of afib 28 ETCO2

## 2019-08-21 NOTE — ED Notes (Signed)
ED TO INPATIENT HANDOFF REPORT  ED Nurse Name and Phone #: Gabriel Cirri jon   S Name/Age/Gender [place] 85 y.o. female Room/Bed: WA16/WA16  Code Status   Code Status: Prior  Home/SNF/Other :   Home  Patient oriented to: self, place, time and situation Is this baseline? Yes   Triage Complete: Triage complete  Chief Complaint suspected covid; sepsis  Triage Note Pt from home.  Pt's son called EMS bc has had SOB and fever for 2 days.  Pt A&Ox4.  Pt c/o of burning with urination.  O2 98% RR 28 156/98 140 in A-fib.  Hx of afib 28 ETCO2   Allergies Allergies  Allergen Reactions  . Infliximab Other (See Comments)    REACTION: "Enlarged intestines and hernia. Also pushed intestines on lower part of lungs." REACTION: "Enlarged intestines and hernia. Also pushed intestines on lower part of lungs."  . Aspirin Nausea And Vomiting    Low Dose is ok Stomach problems  . Carvedilol Other (See Comments)    Fatigue/ ill Fatigue/ ill  . Codeine Nausea And Vomiting    "Deathly Sick"  . Penicillins Rash    Has patient had a PCN reaction causing immediate rash, facial/tongue/throat swelling, SOB or lightheadedness with hypotension: Y Has patient had a PCN reaction causing severe rash involving mucus membranes or skin necrosis: Y Has patient had a PCN reaction that required hospitalization: N Has patient had a PCN reaction occurring within the last 10 years: N If all of the above answers are "NO", then may proceed with Cephalosporin use.     Level of Care/Admitting Diagnosis ED Disposition    ED Disposition Condition [facility] Area: [place] [[id number]]  Level of Care: Telemetry [5]  Admit to tele based on following criteria: Other see comments  Comments: sepsis  Covid Evaluation: Asymptomatic Screening Protocol (No Symptoms)  Diagnosis: Sepsis ([place]) [id number]  Admitting Physician: Elwyn Reach [[id number]]  Attending Physician: Elwyn Reach [[id number]]  Estimated length of stay: past midnight tomorrow  Certification:: I certify this patient will need inpatient services for at least 2 midnights  PT Class (Do Not Modify): Inpatient [101]  PT Acc Code (Do Not Modify): Private [1]       B Medical/Surgery History Past Medical History:  Diagnosis Date  . A-fib ([place])   . Aortic stenosis, moderate    last echo in 2010; AVA 1.1; mild AS per echo in June 2013  . Edema of both legs    s/p laser treatment per Dr. Donnetta Hutching  . Hiatal hernia   . History of chicken pox   . HLD (hyperlipidemia)   . HTN (hypertension)   . Hypothyroidism   . IBS (irritable bowel syndrome)   . Iron deficiency anemia   . Neuropathic pain 09/06/2015  . Rheumatoid arthritis [facility])    Past Surgical History:  Procedure Laterality Date  . APPENDECTOMY  1955  . CARPAL TUNNEL RELEASE Right   . CATARACT EXTRACTION    . ENDOSCOPIC VEIN LASER TREATMENT    . ENDOVENOUS ABLATION SAPHENOUS VEIN W/ LASER  08-29-2012   left greater saphenous vein   Sherren Mocha Early MD  . ENDOVENOUS ABLATION SAPHENOUS VEIN W/ LASER  09-19-2012   right greater saphenous vein by Curt Jews MD  . EYE SURGERY  2005   Bilateral cataract  . FOOT SURGERY  2003   bilateral Hammer Toe  . stab phlebectomy Right 01-02-2013   10-15 incisions right thigh and  calf by Curt Jews MD     A IV Location/Drains/Wounds Patient Lines/Drains/Airways Status   Active Line/Drains/Airways    Name:   Placement date:   Placement time:   Site:   Days:   Peripheral IV 08/21/19 Left Wrist   08/21/19    1833    Wrist   less than 1   PICC Single Lumen 09/12/18 PICC Right Brachial 34 cm 0 cm   09/12/18    0903    Brachial   343          Intake/Output Last 24 hours No intake or output data in the 24 hours ending 08/21/19 2328  Labs/Imaging Results for orders placed or performed during the hospital encounter of 08/21/19 (from the past 48 hour(s))  Comprehensive metabolic panel     Status: Abnormal    Collection Time: 08/21/19  6:00 PM  Result Value Ref Range   Sodium 134 (L) 135 - 145 mmol/L   Potassium 3.6 3.5 - 5.1 mmol/L   Chloride 101 98 - 111 mmol/L   CO2 21 (L) 22 - 32 mmol/L   Glucose, Bld 136 (H) 70 - 99 mg/dL   BUN 14 8 - 23 mg/dL   Creatinine, Ser 1.07 (H) 0.44 - 1.00 mg/dL   Calcium 8.6 (L) 8.9 - 10.3 mg/dL   Total Protein 7.5 6.5 - 8.1 g/dL   Albumin 3.2 (L) 3.5 - 5.0 g/dL   AST 34 15 - 41 U/L   ALT 21 0 - 44 U/L   Alkaline Phosphatase 104 38 - 126 U/L   Total Bilirubin 1.2 0.3 - 1.2 mg/dL   GFR calc non Af Amer 46 (L) >60 mL/min   GFR calc Af Amer 53 (L) >60 mL/min   Anion gap 12 5 - 15    Comment: Performed at [facility], [place] [address], [place], [place] [address]  CBC WITH DIFFERENTIAL     Status: Abnormal   Collection Time: 08/21/19  6:00 PM  Result Value Ref Range   WBC 18.2 (H) 4.0 - 10.5 K/uL   RBC 3.86 (L) 3.87 - 5.11 MIL/uL   Hemoglobin 10.8 (L) 12.0 - 15.0 g/dL   HCT 35.8 (L) 36.0 - 46.0 %   MCV 92.7 80.0 - 100.0 fL   MCH 28.0 26.0 - 34.0 pg   MCHC 30.2 30.0 - 36.0 g/dL   RDW 16.4 (H) 11.5 - 15.5 %   Platelets 248 150 - 400 K/uL   nRBC 0.0 0.0 - 0.2 %   Neutrophils Relative % 83 %   Neutro Abs 15.2 (H) 1.7 - 7.7 K/uL   Lymphocytes Relative 9 %   Lymphs Abs 1.6 0.7 - 4.0 K/uL   Monocytes Relative 7 %   Monocytes Absolute 1.2 (H) 0.1 - 1.0 K/uL   Eosinophils Relative 0 %   Eosinophils Absolute 0.0 0.0 - 0.5 K/uL   Basophils Relative 0 %   Basophils Absolute 0.1 0.0 - 0.1 K/uL   Immature Granulocytes 1 %   Abs Immature Granulocytes 0.09 (H) 0.00 - 0.07 K/uL    Comment: Performed at [facility], [place] [address] [place], [place] [address]  APTT     Status: None   Collection Time: 08/21/19  6:00 PM  Result Value Ref Range   aPTT 27 24 - 36 seconds    Comment: Performed at [facility], [place] [address], [place], [place] [address]  Protime-INR     Status: None  Collection Time: 08/21/19   6:00 PM  Result Value Ref Range   Prothrombin Time 14.4 11.4 - 15.2 seconds   INR 1.1 0.8 - 1.2    Comment: (NOTE) INR goal varies based on device and disease states. Performed at [facility], [place] [address] [place], [place] [address]   Urinalysis, Routine w reflex microscopic     Status: Abnormal   Collection Time: 08/21/19  6:00 PM  Result Value Ref Range   Color, Urine YELLOW YELLOW   APPearance TURBID (A) CLEAR   Specific Gravity, Urine 1.013 1.005 - 1.030   pH 5.0 5.0 - 8.0   Glucose, UA NEGATIVE NEGATIVE mg/dL   Hgb urine dipstick MODERATE (A) NEGATIVE   Bilirubin Urine NEGATIVE NEGATIVE   Ketones, ur NEGATIVE NEGATIVE mg/dL   Protein, ur 100 (A) NEGATIVE mg/dL   Nitrite POSITIVE (A) NEGATIVE   Leukocytes,Ua LARGE (A) NEGATIVE   RBC / HPF >50 (H) 0 - 5 RBC/hpf   WBC, UA >50 (H) 0 - 5 WBC/hpf   Bacteria, UA MANY (A) NONE SEEN   Squamous Epithelial / LPF 6-10 0 - 5   WBC Clumps PRESENT    Mucus PRESENT     Comment: Performed at [facility], [place] [address], [place], [place] [address]  Lactic acid, plasma     Status: None   Collection Time: 08/21/19  6:24 PM  Result Value Ref Range   Lactic Acid, Venous 1.6 0.5 - 1.9 mmol/L    Comment: Performed at [facility], [place] [address] [place], [place] [address]  Lactic acid, plasma     Status: None   Collection Time: 08/21/19  8:00 PM  Result Value Ref Range   Lactic Acid, Venous 1.9 0.5 - 1.9 mmol/L    Comment: Performed at [facility], [place] [address] [place], [place] [address]   Dg Chest Port 1 View  Result Date: 08/21/2019 CLINICAL DATA:  Pt from home. Pt's son called EMS because has had SOB and fever for 2 days. Pt c/o of burning with urination. PT HX: ex smoker, HTN EXAM: PORTABLE CHEST 1 VIEW COMPARISON:  10/13/2018 FINDINGS: The heart is enlarged and stable in configuration. There is atherosclerotic calcification of the thoracic aorta. The  lungs are free of focal consolidations and pleural effusions. Large hiatal hernia. IMPRESSION: 1. Stable cardiomegaly. 2. Large hiatal hernia. 3.  Aortic atherosclerosis.  (ICD10-I70.0) Electronically Signed   By: Nolon Nations M.D.   On: 08/21/2019 18:33    Pending Labs Unresulted Labs (From admission, onward)    Start     Ordered   08/21/19 2130  SARS CORONAVIRUS 2 (TAT 6-24 HRS) Nasopharyngeal Nasopharyngeal Swab  (Asymptomatic/Tier 2 Patients Labs)  Once,   STAT    Question Answer Comment  Is this test for diagnosis or screening Screening   Symptomatic for COVID-19 as defined by CDC No   Hospitalized for COVID-19 No   Admitted to ICU for COVID-19 No   Previously tested for COVID-19 No   Resident in a congregate (group) care setting No   Employed in healthcare setting No   Pregnant No      08/21/19 2129   08/21/19 1800  Blood Culture (routine x 2)  BLOOD CULTURE X 2,   STAT     08/21/19 1759   08/21/19 1800  Urine culture  ONCE - STAT,   STAT     08/21/19 1759  Vitals/Pain Today's Vitals   08/21/19 2058 08/21/19 2100 08/21/19 2200 08/21/19 2327  BP: 134/90 138/79 (!) 142/97 (!) 140/99  Pulse: (!) 120 (!) 119  (!) 118  Resp: 15 18 15 16   Temp: 99.2 F (37.3 C)   99.1 F (37.3 C)  TempSrc: Oral   Oral  SpO2: 97% 98%  99%  Weight:      Height:      PainSc: 8    8     Isolation Precautions No active isolations  Medications Medications  sodium chloride 0.9 % bolus 1,000 mL (0 mLs Intravenous Stopped 08/21/19 2057)  acetaminophen (TYLENOL) tablet 650 mg (650 mg Oral Given 08/21/19 1843)  cefTRIAXone (ROCEPHIN) 1 g in sodium chloride 0.9 % 100 mL IVPB (0 g Intravenous Stopped 08/21/19 2137)    Mobility walks Low fall risk   Focused Assessments   R Recommendations: See Admitting Provider Note  Report given to:   Additional Notes:

## 2019-08-21 NOTE — ED Notes (Signed)
Pt refuses in and out -

## 2019-08-21 NOTE — ED Notes (Signed)
Pt said she will urinate if given fluids/ water given

## 2019-08-21 NOTE — H&P (Signed)
History and Physical   [name] [id number] DOB: May 31, 1927 DOA: 08/21/2019  Referring MD/NP/PA: Dr. Roderic Palau  PCP: [name], NP   Outpatient Specialists: None  Patient coming from: Home  Chief Complaint: Fever and shortness of breath for 2 days  HPI: [name] is a 85 y.o. female with medical history significant of atrial fibrillation, aortic stenosis, hypertension, hypothyroidism, iron deficiency anemia rheumatoid arthritis who presented from home with fever chills as well as shortness of breath for 2 days.  Patient has chills and weakness.  She was seen in the ER and evaluated.  No contact with anybody was COVID-19 no prior GI symptoms.  She denied any cough.  Denied any other recent symptoms.  Patient was found to have evidence of UTI.  She is also found to have tachycardia.  She is being admitted with sepsis secondary to UTI..  ED Course: Temperature 100.9 blood pressure 142/97 pulse 135 respirate of 18 oxygen sats 96% room air.  White count is 18.2 hemoglobin is 10.8 and platelet count of 248.  Sodium 134 potassium 3.6 chloride 101 CO2 21 BUN 14 creatinine 1.07 calcium 8.6.  Glucose 136 INR 1.1.  0.9 urinalysis showed nitrites positive WBC clumps with many bacteria also large leukocyte Estrace.  Urine and blood cultures were obtained and patient is being admitted with sepsis.  COVID-19 screen is currently pending  Review of Systems: As per HPI otherwise 10 point review of systems negative.    Past Medical History:  Diagnosis Date  . A-fib ([place])   . Aortic stenosis, moderate    last echo in 2010; AVA 1.1; mild AS per echo in June 2013  . Edema of both legs    s/p laser treatment per Dr. Donnetta Hutching  . Hiatal hernia   . History of chicken pox   . HLD (hyperlipidemia)   . HTN (hypertension)   . Hypothyroidism   . IBS (irritable bowel syndrome)   . Iron deficiency anemia   . Neuropathic pain 09/06/2015  . Rheumatoid arthritis [facility])     Past Surgical  History:  Procedure Laterality Date  . APPENDECTOMY  1955  . CARPAL TUNNEL RELEASE Right   . CATARACT EXTRACTION    . ENDOSCOPIC VEIN LASER TREATMENT    . ENDOVENOUS ABLATION SAPHENOUS VEIN W/ LASER  08-29-2012   left greater saphenous vein   Sherren Mocha Early MD  . ENDOVENOUS ABLATION SAPHENOUS VEIN W/ LASER  09-19-2012   right greater saphenous vein by Curt Jews MD  . EYE SURGERY  2005   Bilateral cataract  . FOOT SURGERY  2003   bilateral Hammer Toe  . stab phlebectomy Right 01-02-2013   10-15 incisions right thigh and calf by Curt Jews MD     reports that she quit smoking about 35 years ago. Her smoking use included cigarettes. She has never used smokeless tobacco. She reports that she does not drink alcohol or use drugs.  Allergies  Allergen Reactions  . Infliximab Other (See Comments)    REACTION: "Enlarged intestines and hernia. Also pushed intestines on lower part of lungs." REACTION: "Enlarged intestines and hernia. Also pushed intestines on lower part of lungs."  . Aspirin Nausea And Vomiting    Low Dose is ok Stomach problems  . Carvedilol Other (See Comments)    Fatigue/ ill Fatigue/ ill  . Codeine Nausea And Vomiting    "Deathly Sick"  . Penicillins Rash    Has patient had a PCN reaction causing immediate rash, facial/tongue/throat swelling,  SOB or lightheadedness with hypotension: Y Has patient had a PCN reaction causing severe rash involving mucus membranes or skin necrosis: Y Has patient had a PCN reaction that required hospitalization: N Has patient had a PCN reaction occurring within the last 10 years: N If all of the above answers are "NO", then may proceed with Cephalosporin use.     Family History  Problem Relation Age of Onset  . Heart disease Mother   . Peripheral vascular disease Mother   . Heart disease Father   . Peripheral vascular disease Father        Right leg amputation  . COPD Father   . Heart disease Brother 34       Heart Disease before  age 47  . Heart attack Brother   . Peripheral vascular disease Other   . Hypertension Sister      Prior to Admission medications   Medication Sig Start Date End Date Taking? Authorizing Provider  acetaminophen (TYLENOL) 500 MG tablet Take 1,000 mg by mouth every 8 (eight) hours as needed (joint pain).     [provider]  ALPRAZolam Duanne Moron) 0.25 MG tablet Take 1 tablet (0.25 mg total) by mouth daily as needed for anxiety. 07/26/19   Nche, Charlene Brooke, NP  aspirin 81 MG chewable tablet Chew 1 tablet (81 mg total) by mouth daily. 10/26/18   Hennie Duos, MD  doxycycline (VIBRA-TABS) 100 MG tablet Take 100 mg by mouth at bedtime. 06/09/19   [provider]  gabapentin (NEURONTIN) 100 MG capsule Take 1 capsule (100 mg total) by mouth at bedtime. 06/30/19   Nche, Charlene Brooke, NP  levothyroxine (SYNTHROID) 75 MCG tablet Take 1 tablet (75 mcg total) by mouth daily before breakfast. 07/05/19   Nche, Charlene Brooke, NP  metoprolol succinate (TOPROL-XL) 25 MG 24 hr tablet Take 3 tablets (75 mg total) by mouth daily. 06/30/19   Nche, Charlene Brooke, NP  Multiple Vitamin (MULTIVITAMIN WITH MINERALS) TABS tablet Take 1 tablet by mouth daily. 09/27/18   Raiford Noble Latif, DO  Olopatadine HCl 0.2 % SOLN Place 1 drop into both eyes daily. 10/26/18   Hennie Duos, MD    Physical Exam: Vitals:   08/21/19 1740 08/21/19 1741 08/21/19 2058 08/21/19 2100  BP: (!) 142/87  134/90 138/79  Pulse: (!) 135  (!) 120 (!) 119  Resp: 16  15 18   Temp: (!) 100.9 F (38.3 C)  99.2 F (37.3 C)   TempSrc: Oral  Oral   SpO2: 96%  97% 98%  Weight:  47.6 kg    Height:  5' (1.524 m)        Constitutional: NAD, anxious and weak Vitals:   08/21/19 1740 08/21/19 1741 08/21/19 2058 08/21/19 2100  BP: (!) 142/87  134/90 138/79  Pulse: (!) 135  (!) 120 (!) 119  Resp: 16  15 18   Temp: (!) 100.9 F (38.3 C)  99.2 F (37.3 C)   TempSrc: Oral  Oral   SpO2: 96%  97% 98%  Weight:  47.6 kg     Height:  5' (1.524 m)     Eyes: PERRL, lids and conjunctivae normal ENMT: Mucous membranes are dry. Posterior pharynx clear of any exudate or lesions.Normal dentition.  Neck: normal, supple, no masses, no thyromegaly Respiratory: clear to auscultation bilaterally, no wheezing, no crackles. Normal respiratory effort. No accessory muscle use.  Cardiovascular: Irregularly irregular rate and rhythm with tachycardia, no murmurs / rubs / gallops. No extremity edema. 2+ pedal  pulses. No carotid bruits.  Abdomen: no tenderness, no masses palpated. No hepatosplenomegaly. Bowel sounds positive.  Musculoskeletal: no clubbing / cyanosis. No joint deformity upper and lower extremities. Good ROM, no contractures. Normal muscle tone.  Skin: no rashes, lesions, ulcers. No induration Neurologic: CN 2-12 grossly intact. Sensation intact, DTR normal. Strength 5/5 in all 4.  Psychiatric: Normal judgment and insight. Alert and oriented x 3. Normal mood.     Labs on Admission: I have personally reviewed following labs and imaging studies  CBC: Recent Labs  Lab 08/21/19 1800  WBC 18.2*  NEUTROABS 15.2*  HGB 10.8*  HCT 35.8*  MCV 92.7  PLT [id number]   Basic Metabolic Panel: Recent Labs  Lab 08/21/19 1800  NA 134*  K 3.6  CL 101  CO2 21*  GLUCOSE 136*  BUN 14  CREATININE 1.07*  CALCIUM 8.6*   GFR: Estimated Creatinine Clearance: 25.1 mL/min (A) (by C-G formula based on SCr of 1.07 mg/dL (H)). Liver Function Tests: Recent Labs  Lab 08/21/19 1800  AST 34  ALT 21  ALKPHOS 104  BILITOT 1.2  PROT 7.5  ALBUMIN 3.2*   No results for input(s): LIPASE, AMYLASE in the last 168 hours. No results for input(s): AMMONIA in the last 168 hours. Coagulation Profile: Recent Labs  Lab 08/21/19 1800  INR 1.1   Cardiac Enzymes: No results for input(s): CKTOTAL, CKMB, CKMBINDEX, TROPONINI in the last 168 hours. BNP (last 3 results) No results for input(s): PROBNP in the last 8760 hours. HbA1C: No  results for input(s): HGBA1C in the last 72 hours. CBG: No results for input(s): GLUCAP in the last 168 hours. Lipid Profile: No results for input(s): CHOL, HDL, LDLCALC, TRIG, CHOLHDL, LDLDIRECT in the last 72 hours. Thyroid Function Tests: No results for input(s): TSH, T4TOTAL, FREET4, T3FREE, THYROIDAB in the last 72 hours. Anemia Panel: No results for input(s): VITAMINB12, FOLATE, FERRITIN, TIBC, IRON, RETICCTPCT in the last 72 hours. Urine analysis:    Component Value Date/Time   COLORURINE YELLOW 08/21/2019 1800   APPEARANCEUR TURBID (A) 08/21/2019 1800   LABSPEC 1.013 08/21/2019 1800   PHURINE 5.0 08/21/2019 1800   GLUCOSEU NEGATIVE 08/21/2019 1800   HGBUR MODERATE (A) 08/21/2019 1800   BILIRUBINUR NEGATIVE 08/21/2019 1800   KETONESUR NEGATIVE 08/21/2019 1800   PROTEINUR 100 (A) 08/21/2019 1800   UROBILINOGEN 0.2 07/03/2014 0313   NITRITE POSITIVE (A) 08/21/2019 1800   LEUKOCYTESUR LARGE (A) 08/21/2019 1800   Sepsis Labs: @LABRCNTIP (procalcitonin:4,lacticidven:4) )No results found for this or any previous visit (from the past 240 hour(s)).   Radiological Exams on Admission: Dg Chest Port 1 View  Result Date: 08/21/2019 CLINICAL DATA:  Pt from home. Pt's son called EMS because has had SOB and fever for 2 days. Pt c/o of burning with urination. PT HX: ex smoker, HTN EXAM: PORTABLE CHEST 1 VIEW COMPARISON:  10/13/2018 FINDINGS: The heart is enlarged and stable in configuration. There is atherosclerotic calcification of the thoracic aorta. The lungs are free of focal consolidations and pleural effusions. Large hiatal hernia. IMPRESSION: 1. Stable cardiomegaly. 2. Large hiatal hernia. 3.  Aortic atherosclerosis.  (ICD10-I70.0) Electronically Signed   By: Nolon Nations M.D.   On: 08/21/2019 18:33      Assessment/Plan Principal Problem:   Sepsis ([place]) Active Problems:   Aortic stenosis   HLD (hyperlipidemia)   Hypothyroidism   Atrial fibrillation, chronic   Essential  hypertension   Rheumatoid arthritis ([facility])   CKD (chronic kidney disease) stage 2, GFR 60-89 ml/min  Anxiety   UTI (urinary tract infection)     #1 sepsis: Secondary to UTI.  Blood cultures and urine cultures have been obtained.  Continue with IV antibiotics while waiting for culture and sensitivity results  #2 UTI: As per above.  IV Rocephin.  Blood and urine cultures obtained.  IV hydration and follow closely  #3 A. fib with RVR: Most likely worsened by sepsis.  Will initiate IV labetalol and if no response Cardizem will be started.  In the meantime however will resume home regimen and monitor.  #4 essential hypertension: Blood pressures largely elevated but we will resume home regimen and monitor  #5 hypothyroidism: Continue levothyroxine  #6 rheumatoid arthritis: Continue home regimen.  #7 chronic kidney disease stage III: Continue to monitor renal function   DVT prophylaxis: Heparin Code Status: Full code Family Communication: No family at bedside Disposition Plan: Home Consults called: None Admission status: Inpatient  Severity of Illness: The appropriate patient status for this patient is INPATIENT. Inpatient status is judged to be reasonable and necessary in order to provide the required intensity of service to ensure the patient's safety. The patient's presenting symptoms, physical exam findings, and initial radiographic and laboratory data in the context of their chronic comorbidities is felt to place them at high risk for further clinical deterioration. Furthermore, it is not anticipated that the patient will be medically stable for discharge from the hospital within 2 midnights of admission. The following factors support the patient status of inpatient.   " The patient's presenting symptoms include fever and weakness. " The worrisome physical exam findings include acutely ill looking was tachycardia. " The initial radiographic and laboratory data are worrisome because  of evidence of UTI and sepsis. " The chronic co-morbidities include A. fib.   * I certify that at the point of admission it is my clinical judgment that the patient will require inpatient hospital care spanning beyond 2 midnights from the point of admission due to high intensity of service, high risk for further deterioration and high frequency of surveillance required.Barbette Merino MD [facility] Pager [phone]  If 7PM-7AM, please contact night-coverage [email or web address] Password [email or web address]  08/21/2019, 10:02 PM

## 2019-08-22 ENCOUNTER — Other Ambulatory Visit: Payer: Self-pay

## 2019-08-22 ENCOUNTER — Encounter (HOSPITAL_COMMUNITY): Payer: Self-pay | Admitting: *Deleted

## 2019-08-22 LAB — COMPREHENSIVE METABOLIC PANEL
ALT: 19 U/L (ref 0–44)
AST: 37 U/L (ref 15–41)
Albumin: 2.8 g/dL — ABNORMAL LOW (ref 3.5–5.0)
Alkaline Phosphatase: 87 U/L (ref 38–126)
Anion gap: 11 (ref 5–15)
BUN: 18 mg/dL (ref 8–23)
CO2: 20 mmol/L — ABNORMAL LOW (ref 22–32)
Calcium: 8.1 mg/dL — ABNORMAL LOW (ref 8.9–10.3)
Chloride: 104 mmol/L (ref 98–111)
Creatinine, Ser: 1.16 mg/dL — ABNORMAL HIGH (ref 0.44–1.00)
GFR calc Af Amer: 48 mL/min — ABNORMAL LOW (ref >60)
GFR calc non Af Amer: 41 mL/min — ABNORMAL LOW (ref >60)
Glucose, Bld: 137 mg/dL — ABNORMAL HIGH (ref 70–99)
Potassium: 3.5 mmol/L (ref 3.5–5.1)
Sodium: 135 mmol/L (ref 135–145)
Total Bilirubin: 1.6 mg/dL — ABNORMAL HIGH (ref 0.3–1.2)
Total Protein: 6.7 g/dL (ref 6.5–8.1)

## 2019-08-22 LAB — CBC
HCT: 30.9 % — ABNORMAL LOW (ref 36.0–46.0)
Hemoglobin: 9.5 g/dL — ABNORMAL LOW (ref 12.0–15.0)
MCH: 27.9 pg (ref 26.0–34.0)
MCHC: 30.7 g/dL (ref 30.0–36.0)
MCV: 90.6 fL (ref 80.0–100.0)
Platelets: 212 10*3/uL (ref 150–400)
RBC: 3.41 MIL/uL — ABNORMAL LOW (ref 3.87–5.11)
RDW: 16.5 % — ABNORMAL HIGH (ref 11.5–15.5)
WBC: 17 10*3/uL — ABNORMAL HIGH (ref 4.0–10.5)
nRBC: 0 % (ref 0.0–0.2)

## 2019-08-22 LAB — BLOOD CULTURE ID PANEL (REFLEXED)

## 2019-08-22 LAB — SARS CORONAVIRUS 2 (TAT 6-24 HRS): SARS Coronavirus 2: NEGATIVE

## 2019-08-22 MED ORDER — ADULT MULTIVITAMIN W/MINERALS CH
1.0000 | ORAL_TABLET | Freq: Every day | ORAL | Status: DC
  Administered 2019-08-22 – 2019-08-24 (×3): 1 via ORAL
  Filled 2019-08-22 (×3): qty 1

## 2019-08-22 MED ORDER — SODIUM CHLORIDE 0.9 % IV SOLN
INTRAVENOUS | Status: DC
  Administered 2019-08-22 – 2019-08-23 (×2): via INTRAVENOUS

## 2019-08-22 MED ORDER — METOPROLOL SUCCINATE ER 50 MG PO TB24
75.0000 mg | ORAL_TABLET | Freq: Every day | ORAL | Status: DC
  Administered 2019-08-22 – 2019-08-24 (×3): 75 mg via ORAL
  Filled 2019-08-22 (×3): qty 1

## 2019-08-22 MED ORDER — SODIUM CHLORIDE 0.9 % IV SOLN
2.0000 g | INTRAVENOUS | Status: DC
  Administered 2019-08-22 – 2019-08-24 (×3): 2 g via INTRAVENOUS
  Filled 2019-08-22 (×3): qty 2

## 2019-08-22 MED ORDER — ACETAMINOPHEN 650 MG RE SUPP: 650.0000 mg | Freq: Four times a day (QID) | RECTAL | Status: DC | PRN

## 2019-08-22 MED ORDER — LEVOTHYROXINE SODIUM 50 MCG PO TABS
75.0000 ug | ORAL_TABLET | Freq: Every day | ORAL | Status: DC
  Administered 2019-08-23 – 2019-08-24 (×2): 75 ug via ORAL
  Filled 2019-08-22 (×2): qty 1

## 2019-08-22 MED ORDER — DILTIAZEM HCL-DEXTROSE 125-5 MG/125ML-% IV SOLN (PREMIX)
5.0000 mg/h | INTRAVENOUS | Status: DC
  Administered 2019-08-22: 5 mg/h via INTRAVENOUS
  Filled 2019-08-22: qty 125

## 2019-08-22 MED ORDER — DILTIAZEM LOAD VIA INFUSION
10.0000 mg | Freq: Once | INTRAVENOUS | Status: AC
  Administered 2019-08-22: 10 mg via INTRAVENOUS
  Filled 2019-08-22: qty 10

## 2019-08-22 MED ORDER — ACETAMINOPHEN 325 MG PO TABS
650.0000 mg | ORAL_TABLET | Freq: Four times a day (QID) | ORAL | Status: DC | PRN
  Administered 2019-08-22 – 2019-08-23 (×2): 650 mg via ORAL
  Filled 2019-08-22 (×2): qty 2

## 2019-08-22 MED ORDER — GABAPENTIN 100 MG PO CAPS
100.0000 mg | ORAL_CAPSULE | Freq: Every day | ORAL | Status: DC
  Administered 2019-08-22 – 2019-08-23 (×2): 100 mg via ORAL
  Filled 2019-08-22 (×2): qty 1

## 2019-08-22 MED ORDER — ENOXAPARIN SODIUM 30 MG/0.3ML ~~LOC~~ SOLN
30.0000 mg | SUBCUTANEOUS | Status: DC
  Administered 2019-08-22 – 2019-08-23 (×2): 30 mg via SUBCUTANEOUS
  Filled 2019-08-22 (×2): qty 0.3

## 2019-08-22 MED ORDER — HEPARIN SODIUM (PORCINE) 5000 UNIT/ML IJ SOLN
5000.0000 [IU] | Freq: Three times a day (TID) | INTRAMUSCULAR | Status: DC
  Administered 2019-08-22 (×2): 5000 [IU] via SUBCUTANEOUS
  Filled 2019-08-22 (×2): qty 1

## 2019-08-22 MED ORDER — SODIUM CHLORIDE 0.9 % IV SOLN
1.0000 g | INTRAVENOUS | Status: DC
  Filled 2019-08-22: qty 10

## 2019-08-22 MED ORDER — ASPIRIN 81 MG PO CHEW
81.0000 mg | CHEWABLE_TABLET | Freq: Every day | ORAL | Status: DC
  Administered 2019-08-22 – 2019-08-24 (×3): 81 mg via ORAL
  Filled 2019-08-22 (×3): qty 1

## 2019-08-22 MED ORDER — SODIUM CHLORIDE 0.9 % IV BOLUS
500.0000 mL | Freq: Once | INTRAVENOUS | Status: AC
  Administered 2019-08-22: 500 mL via INTRAVENOUS

## 2019-08-22 MED ORDER — ENSURE ENLIVE PO LIQD: 237.0000 mL | ORAL | Status: DC

## 2019-08-22 MED ORDER — OLOPATADINE HCL 0.1 % OP SOLN
1.0000 [drp] | Freq: Two times a day (BID) | OPHTHALMIC | Status: DC
  Administered 2019-08-22 – 2019-08-24 (×4): 1 [drp] via OPHTHALMIC
  Filled 2019-08-22: qty 5

## 2019-08-22 MED ORDER — ONDANSETRON HCL 4 MG/2ML IJ SOLN
4.0000 mg | Freq: Four times a day (QID) | INTRAMUSCULAR | Status: DC | PRN
  Administered 2019-08-22: 4 mg via INTRAVENOUS
  Filled 2019-08-22: qty 2

## 2019-08-22 MED ORDER — ALPRAZOLAM 0.25 MG PO TABS: 0.2500 mg | ORAL_TABLET | Freq: Every day | ORAL | Status: DC | PRN

## 2019-08-22 MED ORDER — ONDANSETRON HCL 4 MG PO TABS: 4.0000 mg | ORAL_TABLET | Freq: Four times a day (QID) | ORAL | Status: DC | PRN

## 2019-08-22 NOTE — Progress Notes (Signed)
Yellow MEWs initiated, patient's heart rate have been in the 120s in ED, on getting to the unit on call provider Dr Myna Hidalgo notified, no new order given will continue to monitor patient.

## 2019-08-22 NOTE — Progress Notes (Signed)
PHARMACY - PHYSICIAN COMMUNICATION CRITICAL VALUE ALERT - BLOOD CULTURE IDENTIFICATION (BCID)  [name] is an 85 y.o. female who presented to [facility] on 08/21/2019 with a chief complaint of fever, SOB and dysuria  Assessment: UTI as source  Name of physician (or Provider) Contacted: A. Adhikari via secure chat  Current antibiotics: ceftriaxone 1g dailiy  Changes to prescribed antibiotics recommended:  Increase ceftriaxone to 2g IV daily  Results for orders placed or performed during the hospital encounter of 08/21/19  Blood Culture ID Panel (Reflexed) (Collected: 08/21/2019  6:24 PM)  Result Value Ref Range   Enterococcus species NOT DETECTED NOT DETECTED   Listeria monocytogenes NOT DETECTED NOT DETECTED   Staphylococcus species NOT DETECTED NOT DETECTED   Staphylococcus aureus (BCID) NOT DETECTED NOT DETECTED   Streptococcus species NOT DETECTED NOT DETECTED   Streptococcus agalactiae NOT DETECTED NOT DETECTED   Streptococcus pneumoniae NOT DETECTED NOT DETECTED   Streptococcus pyogenes NOT DETECTED NOT DETECTED   Acinetobacter baumannii NOT DETECTED NOT DETECTED   Enterobacteriaceae species DETECTED (A) NOT DETECTED   Enterobacter cloacae complex NOT DETECTED NOT DETECTED   Escherichia coli DETECTED (A) NOT DETECTED   Klebsiella oxytoca NOT DETECTED NOT DETECTED   Klebsiella pneumoniae NOT DETECTED NOT DETECTED   Proteus species NOT DETECTED NOT DETECTED   Serratia marcescens NOT DETECTED NOT DETECTED   Carbapenem resistance NOT DETECTED NOT DETECTED   Haemophilus influenzae NOT DETECTED NOT DETECTED   Neisseria meningitidis NOT DETECTED NOT DETECTED   Pseudomonas aeruginosa NOT DETECTED NOT DETECTED   Candida albicans NOT DETECTED NOT DETECTED   Candida glabrata NOT DETECTED NOT DETECTED   Candida krusei NOT DETECTED NOT DETECTED   Candida parapsilosis NOT DETECTED NOT DETECTED   Candida tropicalis NOT DETECTED NOT DETECTED    Natale Lay, PharmD  Candidate 08/22/2019  11:45 AM

## 2019-08-22 NOTE — Progress Notes (Signed)
Started Cardizem drip and bolus as ordered and patient's heart rate trending down now, patient in bed resting, no distress noted.  reported off to day shift nurse to F/U with plan of care.

## 2019-08-22 NOTE — Progress Notes (Signed)
Patient's heart rate sustaining in the 160s-180s, noted patient with  tremors stated she is cold, BP in the 130s,  notified Dr. Myna Hidalgo orders written, will continue to monitor patient and Follow plan of care.

## 2019-08-22 NOTE — Progress Notes (Signed)
PROGRESS NOTE    [name]  [id number] DOB: 05-17-1927 DOA: 08/21/2019 PCP: [name], NP   Brief Narrative:  Patient is a 85 year old female with history of A. fib, aortic stenosis, hypertension, hypothyroidism, iron deficiency anemia, rheumatoid arthritis who presents from home with fevers, chills and shortness of breath for last 2 days.  In the emergency department she was found to be febrile, had leukocytosis.  UA showed urinary tract infection.  Now blood cultures showing E. Coli.  Assessment & Plan:   Principal Problem:   Sepsis ([place]) Active Problems:   Aortic stenosis   HLD (hyperlipidemia)   Hypothyroidism   Atrial fibrillation, chronic   Essential hypertension   Rheumatoid arthritis (HCC)   CKD (chronic kidney disease) stage 2, GFR 60-89 ml/min   Anxiety   UTI (urinary tract infection)   E. coli bacteremia/sepsis: Most likely source is urine.  Will follow final blood culture, urine culture report.  Continue current antibiotics.  Currently she is hemodynamically stable, afebrile.Has leucocytosis.  UTI: Management as above.  We will follow-up urine culture.  She has suprapubic tenderness.  A. fib with RVR: Currently rate is controlled.  Continue home metoprolol.  Not on anticoagulation.  Hypertension: Currently blood pressure stable  Hypothyroidism: Continue levothyroxine  Rheumatoid arthritis: Continue home regimen  CKD stage III: Currently kidney function is at baseline.         DVT prophylaxis:Lovenox Code Status: Full Family Communication: Called son on phone for update on 08/22/19 Disposition Plan: Home after full work-up   Consultants: None  Procedures: None  Antimicrobials:  Anti-infectives (From admission, onward)   Start     Dose/Rate Route Frequency Ordered Stop   08/22/19 2200  cefTRIAXone (ROCEPHIN) 1 g in sodium chloride 0.9 % 100 mL IVPB  Status:  Discontinued     1 g 200 mL/hr over 30 Minutes Intravenous Every  24 hours 08/22/19 0046 08/22/19 1143   08/22/19 1200  cefTRIAXone (ROCEPHIN) 2 g in sodium chloride 0.9 % 100 mL IVPB     2 g 200 mL/hr over 30 Minutes Intravenous Every 24 hours 08/22/19 1143     08/21/19 1900  cefTRIAXone (ROCEPHIN) 1 g in sodium chloride 0.9 % 100 mL IVPB     1 g 200 mL/hr over 30 Minutes Intravenous STAT 08/21/19 1853 08/21/19 2137      Subjective: Patient seen and examined at bedside this morning.  Looks comfortable.  Hemodynamically stable.  Rate is well controlled.  Afebrile.  Denies any new complaints.  Feels better  Objective: Vitals:   08/22/19 0655 08/22/19 0712 08/22/19 0838 08/22/19 0900  BP: (!) 153/91 135/62 112/70 105/69  Pulse: (!) 125 (!) 106 (!) 114 94  Resp: (!) 25 (!) 22 (!) 25 19  Temp: 99.7 F (37.6 C)  100 F (37.8 C) 99.3 F (37.4 C)  TempSrc: Oral  Oral Oral  SpO2: 95% 100% 96% 97%  Weight:      Height:        Intake/Output Summary (Last 24 hours) at 08/22/2019 1259 Last data filed at 08/22/2019 [id number] Gross per 24 hour  Intake 799.67 ml  Output -  Net 799.67 ml   Filed Weights   08/21/19 1741  Weight: 47.6 kg    Examination:  General exam: Pleasant elderly female  HEENT:PERRL,Oral mucosa moist, Ear/Nose normal on gross exam Respiratory system: Bilateral equal air entry, normal vesicular breath sounds, no wheezes or crackles  Cardiovascular system: A. fib. No JVD, murmurs, rubs, gallops or clicks.  No pedal edema. Gastrointestinal system: Abdomen is nondistended, soft and has suprapubic tenderness. No organomegaly or masses felt. Normal bowel sounds heard. Central nervous system: Alert and oriented. No focal neurological deficits. Extremities: No edema, no clubbing ,no cyanosis, distal peripheral pulses palpable. Skin: No rashes, lesions or ulcers,no icterus ,no pallor  Data Reviewed: I have personally reviewed following labs and imaging studies  CBC: Recent Labs  Lab 08/21/19 1800 08/22/19 0431  WBC 18.2* 17.0*   NEUTROABS 15.2*  --   HGB 10.8* 9.5*  HCT 35.8* 30.9*  MCV 92.7 90.6  PLT 248 [id number]   Basic Metabolic Panel: Recent Labs  Lab 08/21/19 1800 08/22/19 0431  NA 134* 135  K 3.6 3.5  CL 101 104  CO2 21* 20*  GLUCOSE 136* 137*  BUN 14 18  CREATININE 1.07* 1.16*  CALCIUM 8.6* 8.1*   GFR: Estimated Creatinine Clearance: 23.2 mL/min (A) (by C-G formula based on SCr of 1.16 mg/dL (H)). Liver Function Tests: Recent Labs  Lab 08/21/19 1800 08/22/19 0431  AST 34 37  ALT 21 19  ALKPHOS 104 87  BILITOT 1.2 1.6*  PROT 7.5 6.7  ALBUMIN 3.2* 2.8*   No results for input(s): LIPASE, AMYLASE in the last 168 hours. No results for input(s): AMMONIA in the last 168 hours. Coagulation Profile: Recent Labs  Lab 08/21/19 1800  INR 1.1   Cardiac Enzymes: No results for input(s): CKTOTAL, CKMB, CKMBINDEX, TROPONINI in the last 168 hours. BNP (last 3 results) No results for input(s): PROBNP in the last 8760 hours. HbA1C: No results for input(s): HGBA1C in the last 72 hours. CBG: No results for input(s): GLUCAP in the last 168 hours. Lipid Profile: No results for input(s): CHOL, HDL, LDLCALC, TRIG, CHOLHDL, LDLDIRECT in the last 72 hours. Thyroid Function Tests: No results for input(s): TSH, T4TOTAL, FREET4, T3FREE, THYROIDAB in the last 72 hours. Anemia Panel: No results for input(s): VITAMINB12, FOLATE, FERRITIN, TIBC, IRON, RETICCTPCT in the last 72 hours. Sepsis Labs: Recent Labs  Lab 08/21/19 1824 08/21/19 2000  LATICACIDVEN 1.6 1.9    Recent Results (from the past 240 hour(s))  Blood Culture (routine x 2)     Status: Abnormal (Preliminary result)   Collection Time: 08/21/19  6:24 PM   Specimen: BLOOD RIGHT FOREARM  Result Value Ref Range Status   Specimen Description   Final    BLOOD RIGHT FOREARM Performed at [facility] [address], [place], [place] [address]    Special Requests   Final    BOTTLES DRAWN AEROBIC AND ANAEROBIC Blood Culture  adequate volume Performed at [facility] [address] [place], [place] [address]    Culture  Setup Time (A)  Final    GRAM VARIABLE ROD AEROBIC BOTTLE ONLY Organism ID to follow CRITICAL RESULT CALLED TO, READ BACK BY AND VERIFIED WITH: Shelda Jakes PharmD 11:25 08/22/19 ([email or web address])    Culture   Final    NO GROWTH < 12 HOURS Performed at [facility], [place] [address] [place], [place] [address]    Report Status PENDING  Incomplete  Blood Culture ID Panel (Reflexed)     Status: Abnormal   Collection Time: 08/21/19  6:24 PM  Result Value Ref Range Status   Enterococcus species NOT DETECTED NOT DETECTED Final   Listeria monocytogenes NOT DETECTED NOT DETECTED Final   Staphylococcus species NOT DETECTED NOT DETECTED Final   Staphylococcus aureus (BCID) NOT DETECTED NOT DETECTED Final   Streptococcus species NOT DETECTED  NOT DETECTED Final   Streptococcus agalactiae NOT DETECTED NOT DETECTED Final   Streptococcus pneumoniae NOT DETECTED NOT DETECTED Final   Streptococcus pyogenes NOT DETECTED NOT DETECTED Final   Acinetobacter baumannii NOT DETECTED NOT DETECTED Final   Enterobacteriaceae species DETECTED (A) NOT DETECTED Final    Comment: Enterobacteriaceae represent a large family of gram-negative bacteria, not a single organism. CRITICAL RESULT CALLED TO, READ BACK BY AND VERIFIED WITH: Shelda Jakes PharmD 11:25 08/22/19 ([email or web address])    Enterobacter cloacae complex NOT DETECTED NOT DETECTED Final   Escherichia coli DETECTED (A) NOT DETECTED Final    Comment: CRITICAL RESULT CALLED TO, READ BACK BY AND VERIFIED WITH: Shelda Jakes PharmD 11:25 08/22/19 ([email or web address])    Klebsiella oxytoca NOT DETECTED NOT DETECTED Final   Klebsiella pneumoniae NOT DETECTED NOT DETECTED Final   Proteus species NOT DETECTED NOT DETECTED Final   Serratia marcescens NOT DETECTED NOT DETECTED Final   Carbapenem resistance NOT DETECTED NOT DETECTED Final   Haemophilus influenzae NOT DETECTED  NOT DETECTED Final   Neisseria meningitidis NOT DETECTED NOT DETECTED Final   Pseudomonas aeruginosa NOT DETECTED NOT DETECTED Final   Candida albicans NOT DETECTED NOT DETECTED Final   Candida glabrata NOT DETECTED NOT DETECTED Final   Candida krusei NOT DETECTED NOT DETECTED Final   Candida parapsilosis NOT DETECTED NOT DETECTED Final   Candida tropicalis NOT DETECTED NOT DETECTED Final    Comment: Performed at [facility], [place] [address], [place], [place] [address]  Blood Culture (routine x 2)     Status: None (Preliminary result)   Collection Time: 08/21/19  6:26 PM   Specimen: BLOOD  Result Value Ref Range Status   Specimen Description   Final    BLOOD RIGHT ANTECUBITAL Performed at [facility] [address], [place], [place] [address]    Special Requests   Final    BOTTLES DRAWN AEROBIC AND ANAEROBIC Blood Culture adequate volume Performed at [facility] [address] [place], [place] [address]    Culture   Final    NO GROWTH < 12 HOURS Performed at [facility] [address] [place], [place] [address]    Report Status PENDING  Incomplete  SARS CORONAVIRUS 2 (TAT 6-24 HRS) Nasopharyngeal Nasopharyngeal Swab     Status: None   Collection Time: 08/21/19  9:30 PM   Specimen: Nasopharyngeal Swab  Result Value Ref Range Status   SARS Coronavirus 2 NEGATIVE NEGATIVE Final    Comment: (NOTE) SARS-CoV-2 target nucleic acids are NOT DETECTED. The SARS-CoV-2 RNA is generally detectable in upper and lower respiratory specimens during the acute phase of infection. Negative results do not preclude SARS-CoV-2 infection, do not rule out co-infections with other pathogens, and should not be used as the sole basis for treatment or other patient management decisions. Negative results must be combined with clinical observations, patient history, and epidemiological information. The expected result is Negative. Fact Sheet for  Patients: SugarRoll.be Fact Sheet for Healthcare Providers: https://www.woods-mathews.com/ This test is not yet approved or cleared by the [place] FDA and  has been authorized for detection and/or diagnosis of SARS-CoV-2 by FDA under an Emergency Use Authorization (EUA). This EUA will remain  in effect (meaning this test can be used) for the duration of the COVID-19 declaration under Section 56 4(b)(1) of the Act, 21 U.S.C. section 360bbb-3(b)(1), unless the authorization is terminated or revoked sooner. Performed at [facility], [place] [address], [place], [place]  [id number]          Radiology Studies: Dg Chest Port 1 View  Result Date: 08/21/2019 CLINICAL DATA:  Pt from home. Pt's son called EMS because has had SOB and fever for 2 days. Pt c/o of burning with urination. PT HX: ex smoker, HTN EXAM: PORTABLE CHEST 1 VIEW COMPARISON:  10/13/2018 FINDINGS: The heart is enlarged and stable in configuration. There is atherosclerotic calcification of the thoracic aorta. The lungs are free of focal consolidations and pleural effusions. Large hiatal hernia. IMPRESSION: 1. Stable cardiomegaly. 2. Large hiatal hernia. 3.  Aortic atherosclerosis.  (ICD10-I70.0) Electronically Signed   By: Nolon Nations M.D.   On: 08/21/2019 18:33        Scheduled Meds: . heparin  5,000 Units Subcutaneous Q8H  . metoprolol succinate  75 mg Oral Daily   Continuous Infusions: . sodium chloride 150 mL/hr at 08/22/19 0128  . cefTRIAXone (ROCEPHIN)  IV       LOS: 1 day    Time spent: 25 mins.More than 50% of that time was spent in counseling and/or coordination of care.      Shelly Coss, MD [facility] Pager [phone]  If 7PM-7AM, please contact night-coverage [email or web address] Password [email or web address] 08/22/2019, 12:59 PM

## 2019-08-22 NOTE — Progress Notes (Signed)
Initial Nutrition Assessment  RD working remotely.   DOCUMENTATION CODES:   (unable to assess for malnutrition at this time.)  INTERVENTION:  - will order Ensure Enlive once/day, each supplement provides 350 kcal and 20 grams of protein. - will order Magic Cup BID with meals, each supplement provides 290 kcal and 9 grams of protein. - will order daily multivitamin with minerals.    NUTRITION DIAGNOSIS:   Increased nutrient needs related to acute illness as evidenced by estimated needs.  GOAL:   Patient will meet greater than or equal to 90% of their needs  MONITOR:   PO intake, Supplement acceptance, Labs, Weight trends  REASON FOR ASSESSMENT:   Malnutrition Screening Tool  ASSESSMENT:   85 year old female with history of A. fib, aortic stenosis, HTN, hypothyroidism, iron deficiency anemia, and rheumatoid arthritis. She presented to the ED on 10/22 with fevers, chills, and SOB x2 days. UA in the ED showed UTI.  Patient has not eaten any meals today, has mainly sipped on water. Her appetite was at baseline until the past ~1 week when she first started not feeling well. She denies any chewing or swallowing issues, no abdominal pain/pressure or N/V with PO intakes.   Patient is unsure of UBW but feels that she has been losing weight. Per chart review, admission weight of 105 lb appears to be a stated weight. Weight on 8/31 was 120 lb and prior to that, weight was stable from 10/25/18-6/30 (115-118 lb).  Per notes: - e.coli bacteremia/sepsis - leukocytosis - UTI - afib with RVR-- currently rate controlled - hx of stage 3 CKD   Labs reviewed; creatinine: 1.16 mg/dl, Ca: 8.1 mg/dl, GFR: 41 ml/min. Medications reviewed; 75 mcg oral synthroid/day. IVF; NS @ 75 ml/hr.     NUTRITION - FOCUSED PHYSICAL EXAM:  unable to complete at this time.   Diet Order:   Diet Order            Diet Heart Room service appropriate? Yes; Fluid consistency: Thin  Diet effective now               EDUCATION NEEDS:   No education needs have been identified at this time  Skin:  Skin Assessment: Reviewed RN Assessment  Last BM:  10/23  Height:   Ht Readings from Last 1 Encounters:  08/21/19 5' (1.524 m)    Weight:   Wt Readings from Last 1 Encounters:  08/21/19 47.6 kg    Ideal Body Weight:  45.4 kg  BMI:  Body mass index is 20.51 kg/m.  Estimated Nutritional Needs:   Kcal:  1430-1670 kcal  Protein:  60-70 grams  Fluid:  >/= 1.8 L/day      Jarome Matin, MS, RD, LDN, [facility] Inpatient Clinical Dietitian Pager # [phone] After hours/weekend pager # [phone]

## 2019-08-23 LAB — CBC WITH DIFFERENTIAL/PLATELET
Abs Immature Granulocytes: 0.05 10*3/uL (ref 0.00–0.07)
Basophils Absolute: 0 10*3/uL (ref 0.0–0.1)
Basophils Relative: 0 %
Eosinophils Absolute: 0 10*3/uL (ref 0.0–0.5)
Eosinophils Relative: 0 %
HCT: 30.5 % — ABNORMAL LOW (ref 36.0–46.0)
Hemoglobin: 9.3 g/dL — ABNORMAL LOW (ref 12.0–15.0)
Immature Granulocytes: 1 %
Lymphocytes Relative: 17 %
Lymphs Abs: 1.7 10*3/uL (ref 0.7–4.0)
MCH: 27.7 pg (ref 26.0–34.0)
MCHC: 30.5 g/dL (ref 30.0–36.0)
MCV: 90.8 fL (ref 80.0–100.0)
Monocytes Absolute: 0.8 10*3/uL (ref 0.1–1.0)
Monocytes Relative: 8 %
Neutro Abs: 7.5 10*3/uL (ref 1.7–7.7)
Neutrophils Relative %: 74 %
Platelets: 206 10*3/uL (ref 150–400)
RBC: 3.36 MIL/uL — ABNORMAL LOW (ref 3.87–5.11)
RDW: 16.6 % — ABNORMAL HIGH (ref 11.5–15.5)
WBC: 10.2 10*3/uL (ref 4.0–10.5)
nRBC: 0 % (ref 0.0–0.2)

## 2019-08-23 LAB — BASIC METABOLIC PANEL
Anion gap: 10 (ref 5–15)
BUN: 21 mg/dL (ref 8–23)
CO2: 19 mmol/L — ABNORMAL LOW (ref 22–32)
Calcium: 8.1 mg/dL — ABNORMAL LOW (ref 8.9–10.3)
Chloride: 106 mmol/L (ref 98–111)
Creatinine, Ser: 1.23 mg/dL — ABNORMAL HIGH (ref 0.44–1.00)
GFR calc Af Amer: 45 mL/min — ABNORMAL LOW (ref >60)
GFR calc non Af Amer: 39 mL/min — ABNORMAL LOW (ref >60)
Glucose, Bld: 120 mg/dL — ABNORMAL HIGH (ref 70–99)
Potassium: 3.8 mmol/L (ref 3.5–5.1)
Sodium: 135 mmol/L (ref 135–145)

## 2019-08-23 LAB — URINE CULTURE: Culture: >=100000 — AB

## 2019-08-23 MED ORDER — GERHARDT'S BUTT CREAM
TOPICAL_CREAM | Freq: Three times a day (TID) | CUTANEOUS | Status: DC
  Administered 2019-08-23: 21:00:00 via TOPICAL
  Filled 2019-08-23: qty 1

## 2019-08-23 NOTE — Progress Notes (Signed)
PROGRESS NOTE    [name]  [id number] DOB: July 17, 1927 DOA: 08/21/2019 PCP: [name], NP   Brief Narrative:  Patient is a 85 year old female with history of A. fib, aortic stenosis, hypertension, hypothyroidism, iron deficiency anemia, rheumatoid arthritis who presents from home with fevers, chills and shortness of breath for last 2 days.  In the emergency department she was found to be febrile, had leukocytosis.  UA showed urinary tract infection.  Now blood cultures showing E. Coli.  Assessment & Plan:   Principal Problem:   Sepsis ([place]) Active Problems:   Aortic stenosis   HLD (hyperlipidemia)   Hypothyroidism   Atrial fibrillation, chronic   Essential hypertension   Rheumatoid arthritis (HCC)   CKD (chronic kidney disease) stage 2, GFR 60-89 ml/min   Anxiety   UTI (urinary tract infection)   E. coli bacteremia/sepsis: Most likely source is urine.  Will follow final blood culture report . Continue current antibiotics.  Currently she is hemodynamically stable, afebrile.Has leucocytosis which has improved.  UTI: Management as above.  Urine culture showed pansensitive E. coli She presented with  suprapubic tenderness.  A. fib with RVR: Currently rate is controlled.  Continue home metoprolol.  Not on anticoagulation.  Hypertension: Currently blood pressure stable  Hypothyroidism: Continue levothyroxine  Rheumatoid arthritis: Continue home regimen  CKD stage III: Currently kidney function is at baseline.  Debility/deconditioning: Patient seen by physical therapy and recommended home health on discharge.  Nutrition Problem: Increased nutrient needs Etiology: acute illness      DVT prophylaxis:Lovenox Code Status: Full Family Communication: Called son on phone for update on 08/22/19 Disposition Plan: I am really hopeful that she is able to go home tomorrow with home health.  Urine culture showed pansensitive E. coli.  Blood culture final  sensitivity still pending.  Most likely it will also show  same pansensitive coli.  Patient was started on IV antibiotics just yesterday.  I will like to continue IV antibiotics at least for today, follow-up final blood cultures.   Consultants: None  Procedures: None  Antimicrobials:  Anti-infectives (From admission, onward)   Start     Dose/Rate Route Frequency Ordered Stop   08/22/19 2200  cefTRIAXone (ROCEPHIN) 1 g in sodium chloride 0.9 % 100 mL IVPB  Status:  Discontinued     1 g 200 mL/hr over 30 Minutes Intravenous Every 24 hours 08/22/19 0046 08/22/19 1143   08/22/19 1200  cefTRIAXone (ROCEPHIN) 2 g in sodium chloride 0.9 % 100 mL IVPB     2 g 200 mL/hr over 30 Minutes Intravenous Every 24 hours 08/22/19 1143     08/21/19 1900  cefTRIAXone (ROCEPHIN) 1 g in sodium chloride 0.9 % 100 mL IVPB     1 g 200 mL/hr over 30 Minutes Intravenous STAT 08/21/19 1853 08/21/19 2137      Subjective: Patient seen and examined the bedside this morning.  Hemodynamically stable.  Comfortable.  Afebrile today.  Suprapubic pain has improved.  Eager to go home.  Objective: Vitals:   08/22/19 1450 08/22/19 1600 08/22/19 2058 08/23/19 0521  BP: (!) 101/57 114/68 132/89 123/76  Pulse: 95 88 97 96  Resp: (!) 22 17 16  (!) 22  Temp: 99 F (37.2 C) 99.6 F (37.6 C) 99.2 F (37.3 C) 99.4 F (37.4 C)  TempSrc: Oral Oral Oral   SpO2: 99% 98% 96% 97%  Weight:      Height:        Intake/Output Summary (Last 24 hours) at 08/23/2019  1209 Last data filed at 08/23/2019 0530 Gross per 24 hour  Intake 1893.87 ml  Output -  Net 1893.87 ml   Filed Weights   08/21/19 1741  Weight: 47.6 kg    Examination:   General exam: Pleasant elderly female  HEENT:PERRL,Oral mucosa moist, Ear/Nose normal on gross exam Respiratory system: Bilateral equal air entry, normal vesicular breath sounds, no wheezes or crackles  Cardiovascular system: Afib. No JVD, murmurs, rubs, gallops or clicks.  Gastrointestinal system: Abdomen is nondistended, soft and nontender. No organomegaly or masses felt. Normal bowel sounds heard. Central nervous system: Alert and oriented. No focal neurological deficits. Extremities: No edema, no clubbing ,no cyanosis, distal peripheral pulses palpable. Skin: No rashes, lesions or ulcers,no icterus ,no pallor      Data Reviewed: I have personally reviewed following labs and imaging studies  CBC: Recent Labs  Lab 08/21/19 1800 08/22/19 0431 08/23/19 0500  WBC 18.2* 17.0* 10.2  NEUTROABS 15.2*  --  7.5  HGB 10.8* 9.5* 9.3*  HCT 35.8* 30.9* 30.5*  MCV 92.7 90.6 90.8  PLT 248 212 [id number]   Basic Metabolic Panel: Recent Labs  Lab 08/21/19 1800 08/22/19 0431 08/23/19 0500  NA 134* 135 135  K 3.6 3.5 3.8  CL 101 104 106  CO2 21* 20* 19*  GLUCOSE 136* 137* 120*  BUN 14 18 21   CREATININE 1.07* 1.16* 1.23*  CALCIUM 8.6* 8.1* 8.1*   GFR: Estimated Creatinine Clearance: 21.8 mL/min (A) (by C-G formula based on SCr of 1.23 mg/dL (H)). Liver Function Tests: Recent Labs  Lab 08/21/19 1800 08/22/19 0431  AST 34 37  ALT 21 19  ALKPHOS 104 87  BILITOT 1.2 1.6*  PROT 7.5 6.7  ALBUMIN 3.2* 2.8*   No results for input(s): LIPASE, AMYLASE in the last 168 hours. No results for input(s): AMMONIA in the last 168 hours. Coagulation Profile: Recent Labs  Lab 08/21/19 1800  INR 1.1   Cardiac Enzymes: No results for input(s): CKTOTAL, CKMB, CKMBINDEX, TROPONINI in the last 168 hours. BNP (last 3 results) No results for input(s): PROBNP in the last 8760 hours. HbA1C: No results for input(s): HGBA1C in the last 72 hours. CBG: No results for input(s): GLUCAP in the last 168 hours. Lipid Profile: No results for input(s): CHOL, HDL, LDLCALC, TRIG, CHOLHDL, LDLDIRECT in the last 72 hours. Thyroid Function Tests: No results for input(s): TSH, T4TOTAL, FREET4, T3FREE, THYROIDAB in the last 72 hours. Anemia Panel: No results for input(s): VITAMINB12,  FOLATE, FERRITIN, TIBC, IRON, RETICCTPCT in the last 72 hours. Sepsis Labs: Recent Labs  Lab 08/21/19 1824 08/21/19 2000  LATICACIDVEN 1.6 1.9    Recent Results (from the past 240 hour(s))  Urine culture     Status: Abnormal   Collection Time: 08/21/19  6:00 PM   Specimen: In/Out Cath Urine  Result Value Ref Range Status   Specimen Description   Final    IN/OUT CATH URINE Performed at [facility] [address] [place], [place] [address]    Special Requests   Final    NONE Performed at [facility], [place] [address], [place], [place] [address]    Culture >=100,000 COLONIES/mL ESCHERICHIA COLI (A)  Final   Report Status 08/23/2019 FINAL  Final   Organism ID, Bacteria ESCHERICHIA COLI (A)  Final      Susceptibility   Escherichia coli - MIC*    AMPICILLIN 16 INTERMEDIATE Intermediate     CEFAZOLIN <=4 SENSITIVE Sensitive     CEFTRIAXONE <=  1 SENSITIVE Sensitive     CIPROFLOXACIN <=0.25 SENSITIVE Sensitive     GENTAMICIN <=1 SENSITIVE Sensitive     IMIPENEM <=0.25 SENSITIVE Sensitive     NITROFURANTOIN <=16 SENSITIVE Sensitive     TRIMETH/SULFA <=20 SENSITIVE Sensitive     AMPICILLIN/SULBACTAM 4 SENSITIVE Sensitive     PIP/TAZO <=4 SENSITIVE Sensitive     Extended ESBL NEGATIVE Sensitive     * >=100,000 COLONIES/mL ESCHERICHIA COLI  Blood Culture (routine x 2)     Status: Abnormal (Preliminary result)   Collection Time: 08/21/19  6:24 PM   Specimen: BLOOD RIGHT FOREARM  Result Value Ref Range Status   Specimen Description   Final    BLOOD RIGHT FOREARM Performed at [facility] [address], [place], [place] [address]    Special Requests   Final    BOTTLES DRAWN AEROBIC AND ANAEROBIC Blood Culture adequate volume Performed at [facility] [address], [place], [place] [address]    Culture  Setup Time (A)  Final    GRAM VARIABLE ROD AEROBIC BOTTLE ONLY CRITICAL RESULT CALLED TO, READ  BACK BY AND VERIFIED WITH: Shelda Jakes PharmD 11:25 08/22/19 ([email or web address]) Performed at [facility], [address] [place], [place] [address]    Culture ESCHERICHIA COLI (A)  Final   Report Status PENDING  Incomplete  Blood Culture ID Panel (Reflexed)     Status: Abnormal   Collection Time: 08/21/19  6:24 PM  Result Value Ref Range Status   Enterococcus species NOT DETECTED NOT DETECTED Final   Listeria monocytogenes NOT DETECTED NOT DETECTED Final   Staphylococcus species NOT DETECTED NOT DETECTED Final   Staphylococcus aureus (BCID) NOT DETECTED NOT DETECTED Final   Streptococcus species NOT DETECTED NOT DETECTED Final   Streptococcus agalactiae NOT DETECTED NOT DETECTED Final   Streptococcus pneumoniae NOT DETECTED NOT DETECTED Final   Streptococcus pyogenes NOT DETECTED NOT DETECTED Final   Acinetobacter baumannii NOT DETECTED NOT DETECTED Final   Enterobacteriaceae species DETECTED (A) NOT DETECTED Final    Comment: Enterobacteriaceae represent a large family of gram-negative bacteria, not a single organism. CRITICAL RESULT CALLED TO, READ BACK BY AND VERIFIED WITH: Shelda Jakes PharmD 11:25 08/22/19 ([email or web address])    Enterobacter cloacae complex NOT DETECTED NOT DETECTED Final   Escherichia coli DETECTED (A) NOT DETECTED Final    Comment: CRITICAL RESULT CALLED TO, READ BACK BY AND VERIFIED WITH: Shelda Jakes PharmD 11:25 08/22/19 ([email or web address])    Klebsiella oxytoca NOT DETECTED NOT DETECTED Final   Klebsiella pneumoniae NOT DETECTED NOT DETECTED Final   Proteus species NOT DETECTED NOT DETECTED Final   Serratia marcescens NOT DETECTED NOT DETECTED Final   Carbapenem resistance NOT DETECTED NOT DETECTED Final   Haemophilus influenzae NOT DETECTED NOT DETECTED Final   Neisseria meningitidis NOT DETECTED NOT DETECTED Final   Pseudomonas aeruginosa NOT DETECTED NOT DETECTED Final   Candida albicans NOT DETECTED NOT DETECTED Final   Candida glabrata NOT DETECTED NOT DETECTED Final    Candida krusei NOT DETECTED NOT DETECTED Final   Candida parapsilosis NOT DETECTED NOT DETECTED Final   Candida tropicalis NOT DETECTED NOT DETECTED Final    Comment: Performed at [facility], [place] [address], [place], [place] [address]  Blood Culture (routine x 2)     Status: Abnormal (Preliminary result)   Collection Time: 08/21/19  6:26 PM   Specimen: BLOOD  Result Value Ref Range Status   Specimen Description   Final  BLOOD RIGHT ANTECUBITAL Performed at [facility] [address], [place], [place] [address]    Special Requests   Final    BOTTLES DRAWN AEROBIC AND ANAEROBIC Blood Culture adequate volume Performed at [facility] [address], [place], [place] [address]    Culture  Setup Time   Final    GRAM NEGATIVE RODS AEROBIC BOTTLE ONLY CRITICAL VALUE NOTED.  VALUE IS CONSISTENT WITH PREVIOUSLY REPORTED AND CALLED VALUE. Performed at [facility], [place] [address] [place], [place] [address]    Culture ESCHERICHIA COLI (A)  Final   Report Status PENDING  Incomplete  SARS CORONAVIRUS 2 (TAT 6-24 HRS) Nasopharyngeal Nasopharyngeal Swab     Status: None   Collection Time: 08/21/19  9:30 PM   Specimen: Nasopharyngeal Swab  Result Value Ref Range Status   SARS Coronavirus 2 NEGATIVE NEGATIVE Final    Comment: (NOTE) SARS-CoV-2 target nucleic acids are NOT DETECTED. The SARS-CoV-2 RNA is generally detectable in upper and lower respiratory specimens during the acute phase of infection. Negative results do not preclude SARS-CoV-2 infection, do not rule out co-infections with other pathogens, and should not be used as the sole basis for treatment or other patient management decisions. Negative results must be combined with clinical observations, patient history, and epidemiological information. The expected result is Negative. Fact Sheet for Patients: SugarRoll.be Fact Sheet for Healthcare  Providers: https://www.woods-mathews.com/ This test is not yet approved or cleared by the [place] FDA and  has been authorized for detection and/or diagnosis of SARS-CoV-2 by FDA under an Emergency Use Authorization (EUA). This EUA will remain  in effect (meaning this test can be used) for the duration of the COVID-19 declaration under Section 56 4(b)(1) of the Act, 21 U.S.C. section 360bbb-3(b)(1), unless the authorization is terminated or revoked sooner. Performed at [facility], [place] [address], [place], [place] [address]          Radiology Studies: Dg Chest Port 1 View  Result Date: 08/21/2019 CLINICAL DATA:  Pt from home. Pt's son called EMS because has had SOB and fever for 2 days. Pt c/o of burning with urination. PT HX: ex smoker, HTN EXAM: PORTABLE CHEST 1 VIEW COMPARISON:  10/13/2018 FINDINGS: The heart is enlarged and stable in configuration. There is atherosclerotic calcification of the thoracic aorta. The lungs are free of focal consolidations and pleural effusions. Large hiatal hernia. IMPRESSION: 1. Stable cardiomegaly. 2. Large hiatal hernia. 3.  Aortic atherosclerosis.  (ICD10-I70.0) Electronically Signed   By: Nolon Nations M.D.   On: 08/21/2019 18:33        Scheduled Meds: . aspirin  81 mg Oral Daily  . enoxaparin (LOVENOX) injection  30 mg Subcutaneous Q24H  . feeding supplement (ENSURE ENLIVE)  237 mL Oral Q24H  . gabapentin  100 mg Oral QHS  . levothyroxine  75 mcg Oral QAC breakfast  . metoprolol succinate  75 mg Oral Daily  . multivitamin with minerals  1 tablet Oral Daily  . olopatadine  1 drop Both Eyes BID   Continuous Infusions: . sodium chloride 75 mL/hr at 08/23/19 0530  . cefTRIAXone (ROCEPHIN)  IV 2 g (08/22/19 1518)     LOS: 2 days    Time spent: 25 mins.More than 50% of that time was spent in counseling and/or coordination of care.      Shelly Coss, MD [facility] Pager [phone]  If  7PM-7AM, please contact night-coverage [email or web address] Password [email or web address] 08/23/2019, 12:09 PM

## 2019-08-23 NOTE — Evaluation (Signed)
Physical Therapy Evaluation Patient Details Name: [name] MRN: [id number] DOB: [birth year] Today's Date: 08/23/2019   History of Present Illness  Patient is a 85 year old female admitted to ER 08/21/2019 presenting from home with shortness of breathe x2 days. Diagnosis - sepsis. PMH: a fib, aortic stenosis, HEN, hypothyroidism, iron deficiency, anemia, rheumatoid arthritis, CKD, anxiety, UTI.Marland Kitchen    Clinical Impression  Pt admitted with above diagnosis. Patient performed bed mobility well. Upon standing patient unsteady and reaching for objects and therapist to steady herself. Patient refused use of SPC and RW. Patient agreed to ambulate within her room with use of IV pole and other hand held. Patient unsteady on feet despite her reports of walking in her household without reaching for walls or furniture. Prior to admission, patient's son holds patient's hand while ambulating in the community for safety. Pt currently with functional limitations due to the deficits listed below (see PT Problem List). Pt will benefit from skilled PT to increase their independence and safety with mobility to allow discharge to the venue listed below.       Follow Up Recommendations Home health PT;Supervision for mobility/OOB    Equipment Recommendations  Cane    Recommendations for Other Services       Precautions / Restrictions Precautions Precautions: Fall Restrictions Weight Bearing Restrictions: No      Mobility  Bed Mobility Overal bed mobility: Modified Independent                Transfers Overall transfer level: Needs assistance Equipment used: 1 person hand held assist Transfers: Sit to/from Stand;Stand Pivot Transfers Sit to Stand: Min guard Stand pivot transfers: Min assist       General transfer comment: min assist with 1 hand held. Patient refused to use RW or SPC. Would use IV pole with other hand held  Ambulation/Gait Ambulation/Gait assistance: Min assist Gait  Distance (Feet): 30 Feet Assistive device: 1 person hand held assist;IV Pole Gait Pattern/deviations: Step-through pattern;Decreased step length - right;Decreased step length - left;Decreased stride length;Trunk flexed;Narrow base of support Gait velocity: decreased   General Gait Details: slow, somewhat labored cadence, measured. Limited by fear of further onset of abdominal discomfort.  Stairs            Wheelchair Mobility    Modified Rankin (Stroke Patients Only)       Balance Overall balance assessment: Needs assistance Sitting-balance support: Bilateral upper extremity supported;Feet supported Sitting balance-Leahy Scale: Fair     Standing balance support: Bilateral upper extremity supported;During functional activity Standing balance-Leahy Scale: Fair Standing balance comment: fair with IV pole and other hand held; poor without assistance/assistive device                             Pertinent Vitals/Pain Pain Assessment: 0-10 Pain Score: 6  Pain Descriptors / Indicators: Sore Pain Intervention(s): Limited activity within patient's tolerance;Monitored during session    Home Living Family/patient expects to be discharged to:: Private residence Living Arrangements: Children Available Help at Discharge: Family;Available 24 hours/day Type of Home: House Home Access: Level entry     Home Layout: One level Home Equipment: Walker - 2 wheels;Shower seat - built in      Prior Function Level of Independence: Needs assistance   Gait / Transfers Assistance Needed: walked community distances holding onto son or cart  ADL's / Homemaking Assistance Needed: independent with bath, dress, feed; son does cook, clean, laundry, grocery shopping  Comments: no longer drives     Hand Dominance   Dominant Hand: Right    Extremity/Trunk Assessment   Upper Extremity Assessment Upper Extremity Assessment: Generalized weakness    Lower Extremity  Assessment Lower Extremity Assessment: Generalized weakness    Cervical / Trunk Assessment Cervical / Trunk Assessment: Kyphotic  Communication   Communication: No difficulties  Cognition Arousal/Alertness: Awake/alert Behavior During Therapy: WFL for tasks assessed/performed Overall Cognitive Status: Within Functional Limits for tasks assessed                                        General Comments      Exercises     Assessment/Plan    PT Assessment Patient needs continued PT services  PT Problem List Decreased strength;Decreased mobility;Decreased activity tolerance;Decreased balance;Pain;Decreased knowledge of use of DME       PT Treatment Interventions DME instruction;Therapeutic activities;Gait training;Therapeutic exercise;Patient/family education;Balance training;Neuromuscular re-education    PT Goals (Current goals can be found in the Care Plan section)  Acute Rehab PT Goals Patient Stated Goal: Go home with help from son only. PT Goal Formulation: With patient Time For Goal Achievement: 09/06/19 Potential to Achieve Goals: Fair    Frequency Min 3X/week   Barriers to discharge        Co-evaluation               AM-PAC PT "6 Clicks" Mobility  Outcome Measure Help needed turning from your back to your side while in a flat bed without using bedrails?: A Little Help needed moving from lying on your back to sitting on the side of a flat bed without using bedrails?: A Little Help needed moving to and from a bed to a chair (including a wheelchair)?: A Little Help needed standing up from a chair using your arms (e.g., wheelchair or bedside chair)?: A Little Help needed to walk in hospital room?: A Little Help needed climbing 3-5 steps with a railing? : A Little 6 Click Score: 18    End of Session Equipment Utilized During Treatment: Gait belt Activity Tolerance: Patient tolerated treatment well;Patient limited by pain Patient left: in  chair;with call bell/phone within reach;with chair alarm set Nurse Communication: Mobility status PT Visit Diagnosis: Unsteadiness on feet (R26.81);Other abnormalities of gait and mobility (R26.89);Muscle weakness (generalized) (M62.81)    Time: 0950-1020 PT Time Calculation (min) (ACUTE ONLY): 30 min   Charges:   PT Evaluation $PT Eval Moderate Complexity: 1 Mod PT Treatments $Gait Training: 8-22 mins        Floria Raveling. Hartnett-Rands, MS, PT Per [place] [phone] 08/23/2019, 10:35 AM

## 2019-08-23 NOTE — Plan of Care (Signed)
  Problem: Acute Rehab PT Goals(only PT should resolve) Goal: Patient Will Transfer Sit To/From Stand Outcome: Progressing Flowsheets (Taken 08/23/2019 1040) Patient will transfer sit to/from stand: with min guard assist Goal: Pt Will Transfer Bed To Chair/Chair To Bed Outcome: Progressing Flowsheets (Taken 08/23/2019 1040) Pt will Transfer Bed to Chair/Chair to Bed: min guard assist Goal: Pt Will Perform Standing Balance Or Pre-Gait Outcome: Progressing Flowsheets (Taken 08/23/2019 1040) Pt will perform standing balance or pre-gait:  1-2 min  with unilateral UE support Goal: Pt Will Ambulate Outcome: Progressing Flowsheets (Taken 08/23/2019 1040) Pt will Ambulate:  50 feet  with min guard assist  with least restrictive assistive device Goal: Pt/caregiver will Perform Home Exercise Program Outcome: Progressing Flowsheets (Taken 08/23/2019 1040) Pt/caregiver will Perform Home Exercise Program:  For increased strengthening  For improved balance  With minimal assist   Pamala Hurry D. Hartnett-Rands, MS, PT Per [facility] [place] [phone] 08/23/2019

## 2019-08-24 ENCOUNTER — Inpatient Hospital Stay (HOSPITAL_COMMUNITY): Payer: Medicare Other

## 2019-08-24 DIAGNOSIS — I350 Nonrheumatic aortic (valve) stenosis: Secondary | ICD-10-CM

## 2019-08-24 DIAGNOSIS — E782 Mixed hyperlipidemia: Secondary | ICD-10-CM

## 2019-08-24 DIAGNOSIS — N390 Urinary tract infection, site not specified: Secondary | ICD-10-CM

## 2019-08-24 DIAGNOSIS — M0579 Rheumatoid arthritis with rheumatoid factor of multiple sites without organ or systems involvement: Secondary | ICD-10-CM

## 2019-08-24 LAB — CULTURE, BLOOD (ROUTINE X 2)
Special Requests: ADEQUATE
Special Requests: ADEQUATE

## 2019-08-24 LAB — BASIC METABOLIC PANEL
Anion gap: 9 (ref 5–15)
BUN: 25 mg/dL — ABNORMAL HIGH (ref 8–23)
CO2: 21 mmol/L — ABNORMAL LOW (ref 22–32)
Calcium: 8.2 mg/dL — ABNORMAL LOW (ref 8.9–10.3)
Chloride: 105 mmol/L (ref 98–111)
Creatinine, Ser: 1.23 mg/dL — ABNORMAL HIGH (ref 0.44–1.00)
GFR calc Af Amer: 45 mL/min — ABNORMAL LOW (ref >60)
GFR calc non Af Amer: 39 mL/min — ABNORMAL LOW (ref >60)
Glucose, Bld: 129 mg/dL — ABNORMAL HIGH (ref 70–99)
Potassium: 3.5 mmol/L (ref 3.5–5.1)
Sodium: 135 mmol/L (ref 135–145)

## 2019-08-24 MED ORDER — ALUM & MAG HYDROXIDE-SIMETH 200-200-20 MG/5ML PO SUSP
30.0000 mL | Freq: Once | ORAL | Status: DC
  Filled 2019-08-24: qty 30

## 2019-08-24 MED ORDER — CEFDINIR 300 MG PO CAPS: 300.0000 mg | ORAL_CAPSULE | Freq: Two times a day (BID) | ORAL | 0 refills | Status: DC

## 2019-08-24 MED ORDER — LIDOCAINE VISCOUS HCL 2 % MT SOLN
15.0000 mL | Freq: Once | OROMUCOSAL | Status: DC
  Filled 2019-08-24: qty 15

## 2019-08-24 NOTE — Discharge Summary (Signed)
Physician Discharge Summary  [name] [id number] DOB: 08/09/1927 DOA: 08/21/2019  PCP: [name], NP  Admit date: 08/21/2019 Discharge date: 08/24/2019  Admitted From: Inpatient Disposition: home  Recommendations for Outpatient Follow-up:  1. Follow up with PCP in 1-2 weeks  Home Health:No Equipment/Devices:NONE  Discharge Condition:Stable CODE STATUS:Full code Diet recommendation: Regular healthy diet  Brief/Interim Summary: Patient is a 85 year old female with history of A. fib, aortic stenosis, hypertension, hypothyroidism, iron deficiency anemia, rheumatoid arthritis who presents from home with fevers, chills and shortness of breath for last 2 days.  In the emergency department she was found to be febrile, had leukocytosis.  UA showed urinary tract infection.  Now blood cultures showing E. Coli.  Hospital course: E. coli bacteremia/sepsis:  Given clinical and laboratory findings, I agree most likely source is urine.    Patient is remained hemodynamically stable.  Sepsis resolved, she was discharged to complete a 2-week course of antibiotics based on sensitivity profile.  UTI:  Urine culture showed pansensitive E. coli She presented with  suprapubic tenderness.  Treatment as above  Musculoskeletal pain.  Patient reported chest pain upon physical exam it was clearly reproducible chest pain.  EKG was nonischemic and patient described the chest pain as that reproduced on exam.  A. fib with RVR: Currently rate is controlled.  Continue home metoprolol.  Not on anticoagulation.  Will defer to her primary care physician.  Hypertension: Currently blood pressure stable  Hypothyroidism: Continue levothyroxine  Rheumatoid arthritis: Continue home regimen  CKD stage III: Currently kidney function is at baseline.  Debility/deconditioning: Patient seen by physical therapy and recommended home health on discharge although patient declined.  Nutrition  Problem: Increased nutrient needs Etiology: acute illness  Discharge Diagnoses:  Principal Problem:   Sepsis ([place]) Active Problems:   Aortic stenosis   HLD (hyperlipidemia)   Hypothyroidism   Atrial fibrillation, chronic   Essential hypertension   Rheumatoid arthritis (HCC)   CKD (chronic kidney disease) stage 2, GFR 60-89 ml/min   Anxiety   UTI (urinary tract infection)    Discharge Instructions  Discharge Instructions    Call MD for:   Complete by: As directed    ANY ACUTE CHANGE IN MEDICAL CONDITION   Diet - low sodium heart healthy   Complete by: As directed    Increase activity slowly   Complete by: As directed      Allergies as of 08/24/2019      Reactions   Infliximab Other (See Comments)   REACTION: "Enlarged intestines and hernia. Also pushed intestines on lower part of lungs." REACTION: "Enlarged intestines and hernia. Also pushed intestines on lower part of lungs."   Aspirin Nausea And Vomiting   Low Dose is ok Stomach problems   Carvedilol Other (See Comments)   Fatigue/ ill Fatigue/ ill   Codeine Nausea And Vomiting   "Deathly Sick"   Penicillins Rash   Has patient had a PCN reaction causing immediate rash, facial/tongue/throat swelling, SOB or lightheadedness with hypotension: Y Has patient had a PCN reaction causing severe rash involving mucus membranes or skin necrosis: Y Has patient had a PCN reaction that required hospitalization: N Has patient had a PCN reaction occurring within the last 10 years: N If all of the above answers are "NO", then may proceed with Cephalosporin use.      Medication List    STOP taking these medications   doxycycline 100 MG tablet Commonly known as: VIBRA-TABS     TAKE these medications  acetaminophen 500 MG tablet Commonly known as: TYLENOL Take 1,000 mg by mouth every 8 (eight) hours as needed (joint pain).   ALPRAZolam 0.25 MG tablet Commonly known as: XANAX Take 1 tablet (0.25 mg total) by mouth daily  as needed for anxiety.   aspirin 81 MG chewable tablet Chew 1 tablet (81 mg total) by mouth daily.   cefdinir 300 MG capsule Commonly known as: OMNICEF Take 1 capsule (300 mg total) by mouth 2 (two) times daily for 12 days.   gabapentin 100 MG capsule Commonly known as: NEURONTIN Take 1 capsule (100 mg total) by mouth at bedtime.   levothyroxine 75 MCG tablet Commonly known as: SYNTHROID Take 1 tablet (75 mcg total) by mouth daily before breakfast.   metoprolol succinate 25 MG 24 hr tablet Commonly known as: TOPROL-XL Take 3 tablets (75 mg total) by mouth daily.   multivitamin with minerals Tabs tablet Take 1 tablet by mouth daily.   Olopatadine HCl 0.2 % Soln Place 1 drop into both eyes daily. What changed: when to take this       Allergies  Allergen Reactions  . Infliximab Other (See Comments)    REACTION: "Enlarged intestines and hernia. Also pushed intestines on lower part of lungs." REACTION: "Enlarged intestines and hernia. Also pushed intestines on lower part of lungs."  . Aspirin Nausea And Vomiting    Low Dose is ok Stomach problems  . Carvedilol Other (See Comments)    Fatigue/ ill Fatigue/ ill  . Codeine Nausea And Vomiting    "Deathly Sick"  . Penicillins Rash    Has patient had a PCN reaction causing immediate rash, facial/tongue/throat swelling, SOB or lightheadedness with hypotension: Y Has patient had a PCN reaction causing severe rash involving mucus membranes or skin necrosis: Y Has patient had a PCN reaction that required hospitalization: N Has patient had a PCN reaction occurring within the last 10 years: N If all of the above answers are "NO", then may proceed with Cephalosporin use.     Consultations:  NONE   Procedures/Studies: Dg Chest Port 1 View  Result Date: 08/24/2019 CLINICAL DATA:  Shortness of breath EXAM: PORTABLE CHEST 1 VIEW COMPARISON:  Chest radiograph dated 08/21/2019 FINDINGS: The heart is enlarged. Vascular  calcifications are seen in the aortic arch. The lungs are clear. There is no pleural effusion or pneumothorax. A hiatal hernia is redemonstrated. IMPRESSION: Cardiomegaly. No consolidation or effusion. Electronically Signed   By: Zerita Boers M.D.   On: 08/24/2019 11:33   Dg Chest Port 1 View  Result Date: 08/21/2019 CLINICAL DATA:  Pt from home. Pt's son called EMS because has had SOB and fever for 2 days. Pt c/o of burning with urination. PT HX: ex smoker, HTN EXAM: PORTABLE CHEST 1 VIEW COMPARISON:  10/13/2018 FINDINGS: The heart is enlarged and stable in configuration. There is atherosclerotic calcification of the thoracic aorta. The lungs are free of focal consolidations and pleural effusions. Large hiatal hernia. IMPRESSION: 1. Stable cardiomegaly. 2. Large hiatal hernia. 3.  Aortic atherosclerosis.  (ICD10-I70.0) Electronically Signed   By: Nolon Nations M.D.   On: 08/21/2019 18:33       Subjective: Patient doing well. Musculoskeletal chest pain this morning reproducible on exam not concerning for cardiac etiology Patient felt stable discharge home today.  Discharge Exam: Vitals:   08/24/19 0958 08/24/19 1241  BP: 121/78 122/64  Pulse: (!) 103 95  Resp: (!) 22 (!) 24  Temp: 100 F (37.8 C) 98.3 F (36.8 C)  SpO2: 96% 98%   Vitals:   08/23/19 2158 08/24/19 0507 08/24/19 0958 08/24/19 1241  BP: 119/69 132/82 121/78 122/64  Pulse: 92 70 (!) 103 95  Resp: (!) 30 (!) 29 (!) 22 (!) 24  Temp: 98.4 F (36.9 C) 98.9 F (37.2 C) 100 F (37.8 C) 98.3 F (36.8 C)  TempSrc: Oral Oral Oral Oral  SpO2: 95% 97% 96% 98%  Weight:      Height:        General: Pt is alert, awake, not in acute distress Cardiovascular: RRR, S1/S2 +, no rubs, no gallops Respiratory: CTA bilaterally, no wheezing, no rhonchi Abdominal: Soft, NT, ND, bowel sounds + Extremities: no edema, no cyanosis MSK: Reproducible chest pain with palpation, there is the chest pain patient described  having.    The results of significant diagnostics from this hospitalization (including imaging, microbiology, ancillary and laboratory) are listed below for reference.     Microbiology: Recent Results (from the past 240 hour(s))  Urine culture     Status: Abnormal   Collection Time: 08/21/19  6:00 PM   Specimen: In/Out Cath Urine  Result Value Ref Range Status   Specimen Description   Final    IN/OUT CATH URINE Performed at [facility], [place] [address], [place], [place] [address]    Special Requests   Final    NONE Performed at [facility], [place] [address] [place], [place] [address]    Culture >=100,000 COLONIES/mL ESCHERICHIA COLI (A)  Final   Report Status 08/23/2019 FINAL  Final   Organism ID, Bacteria ESCHERICHIA COLI (A)  Final      Susceptibility   Escherichia coli - MIC*    AMPICILLIN 16 INTERMEDIATE Intermediate     CEFAZOLIN <=4 SENSITIVE Sensitive     CEFTRIAXONE <=1 SENSITIVE Sensitive     CIPROFLOXACIN <=0.25 SENSITIVE Sensitive     GENTAMICIN <=1 SENSITIVE Sensitive     IMIPENEM <=0.25 SENSITIVE Sensitive     NITROFURANTOIN <=16 SENSITIVE Sensitive     TRIMETH/SULFA <=20 SENSITIVE Sensitive     AMPICILLIN/SULBACTAM 4 SENSITIVE Sensitive     PIP/TAZO <=4 SENSITIVE Sensitive     Extended ESBL NEGATIVE Sensitive     * >=100,000 COLONIES/mL ESCHERICHIA COLI  Blood Culture (routine x 2)     Status: Abnormal   Collection Time: 08/21/19  6:24 PM   Specimen: BLOOD RIGHT FOREARM  Result Value Ref Range Status   Specimen Description   Final    BLOOD RIGHT FOREARM Performed at [facility] [address] [place], [place] [address]    Special Requests   Final    BOTTLES DRAWN AEROBIC AND ANAEROBIC Blood Culture adequate volume Performed at [facility] [address], [place], [place] [address]    Culture  Setup Time (A)  Final    GRAM VARIABLE ROD AEROBIC BOTTLE ONLY CRITICAL RESULT  CALLED TO, READ BACK BY AND VERIFIED WITH: Shelda Jakes PharmD 11:25 08/22/19 ([email or web address]) Performed at [facility], [address], [place], [place] [address]    Culture ESCHERICHIA COLI (A)  Final   Report Status 08/24/2019 FINAL  Final   Organism ID, Bacteria ESCHERICHIA COLI  Final      Susceptibility   Escherichia coli - MIC*    AMPICILLIN 16 INTERMEDIATE Intermediate     CEFAZOLIN <=4 SENSITIVE Sensitive     CEFEPIME <=1 SENSITIVE Sensitive     CEFTAZIDIME <=1 SENSITIVE Sensitive     CEFTRIAXONE <=  1 SENSITIVE Sensitive     CIPROFLOXACIN <=0.25 SENSITIVE Sensitive     GENTAMICIN <=1 SENSITIVE Sensitive     IMIPENEM <=0.25 SENSITIVE Sensitive     TRIMETH/SULFA <=20 SENSITIVE Sensitive     AMPICILLIN/SULBACTAM 4 SENSITIVE Sensitive     PIP/TAZO <=4 SENSITIVE Sensitive     Extended ESBL NEGATIVE Sensitive     * ESCHERICHIA COLI  Blood Culture ID Panel (Reflexed)     Status: Abnormal   Collection Time: 08/21/19  6:24 PM  Result Value Ref Range Status   Enterococcus species NOT DETECTED NOT DETECTED Final   Listeria monocytogenes NOT DETECTED NOT DETECTED Final   Staphylococcus species NOT DETECTED NOT DETECTED Final   Staphylococcus aureus (BCID) NOT DETECTED NOT DETECTED Final   Streptococcus species NOT DETECTED NOT DETECTED Final   Streptococcus agalactiae NOT DETECTED NOT DETECTED Final   Streptococcus pneumoniae NOT DETECTED NOT DETECTED Final   Streptococcus pyogenes NOT DETECTED NOT DETECTED Final   Acinetobacter baumannii NOT DETECTED NOT DETECTED Final   Enterobacteriaceae species DETECTED (A) NOT DETECTED Final    Comment: Enterobacteriaceae represent a large family of gram-negative bacteria, not a single organism. CRITICAL RESULT CALLED TO, READ BACK BY AND VERIFIED WITH: Shelda Jakes PharmD 11:25 08/22/19 ([email or web address])    Enterobacter cloacae complex NOT DETECTED NOT DETECTED Final   Escherichia coli DETECTED (A) NOT DETECTED Final    Comment: CRITICAL RESULT CALLED TO,  READ BACK BY AND VERIFIED WITH: Shelda Jakes PharmD 11:25 08/22/19 ([email or web address])    Klebsiella oxytoca NOT DETECTED NOT DETECTED Final   Klebsiella pneumoniae NOT DETECTED NOT DETECTED Final   Proteus species NOT DETECTED NOT DETECTED Final   Serratia marcescens NOT DETECTED NOT DETECTED Final   Carbapenem resistance NOT DETECTED NOT DETECTED Final   Haemophilus influenzae NOT DETECTED NOT DETECTED Final   Neisseria meningitidis NOT DETECTED NOT DETECTED Final   Pseudomonas aeruginosa NOT DETECTED NOT DETECTED Final   Candida albicans NOT DETECTED NOT DETECTED Final   Candida glabrata NOT DETECTED NOT DETECTED Final   Candida krusei NOT DETECTED NOT DETECTED Final   Candida parapsilosis NOT DETECTED NOT DETECTED Final   Candida tropicalis NOT DETECTED NOT DETECTED Final    Comment: Performed at [facility], [place] [address] [place], [place] [address]  Blood Culture (routine x 2)     Status: Abnormal   Collection Time: 08/21/19  6:26 PM   Specimen: BLOOD  Result Value Ref Range Status   Specimen Description   Final    BLOOD RIGHT ANTECUBITAL Performed at [facility] [address], [place], [place] [address]    Special Requests   Final    BOTTLES DRAWN AEROBIC AND ANAEROBIC Blood Culture adequate volume Performed at [facility] [address], [place], [place] [address]    Culture  Setup Time   Final    GRAM NEGATIVE RODS AEROBIC BOTTLE ONLY CRITICAL VALUE NOTED.  VALUE IS CONSISTENT WITH PREVIOUSLY REPORTED AND CALLED VALUE.    Culture (A)  Final    ESCHERICHIA COLI SUSCEPTIBILITIES PERFORMED ON PREVIOUS CULTURE WITHIN THE LAST 5 DAYS. Performed at [facility], [place] [address] [place], [place] [address]    Report Status 08/24/2019 FINAL  Final  SARS CORONAVIRUS 2 (TAT 6-24 HRS) Nasopharyngeal Nasopharyngeal Swab     Status: None   Collection Time: 08/21/19  9:30 PM   Specimen: Nasopharyngeal Swab  Result Value Ref Range Status    SARS Coronavirus 2 NEGATIVE NEGATIVE Final  Comment: (NOTE) SARS-CoV-2 target nucleic acids are NOT DETECTED. The SARS-CoV-2 RNA is generally detectable in upper and lower respiratory specimens during the acute phase of infection. Negative results do not preclude SARS-CoV-2 infection, do not rule out co-infections with other pathogens, and should not be used as the sole basis for treatment or other patient management decisions. Negative results must be combined with clinical observations, patient history, and epidemiological information. The expected result is Negative. Fact Sheet for Patients: SugarRoll.be Fact Sheet for Healthcare Providers: https://www.woods-mathews.com/ This test is not yet approved or cleared by the [place] FDA and  has been authorized for detection and/or diagnosis of SARS-CoV-2 by FDA under an Emergency Use Authorization (EUA). This EUA will remain  in effect (meaning this test can be used) for the duration of the COVID-19 declaration under Section 56 4(b)(1) of the Act, 21 U.S.C. section 360bbb-3(b)(1), unless the authorization is terminated or revoked sooner. Performed at [facility], [place] [address], [place], [place] [address]      Labs: BNP (last 3 results) No results for input(s): BNP in the last 8760 hours. Basic Metabolic Panel: Recent Labs  Lab 08/21/19 1800 08/22/19 0431 08/23/19 0500 08/24/19 0516  NA 134* 135 135 135  K 3.6 3.5 3.8 3.5  CL 101 104 106 105  CO2 21* 20* 19* 21*  GLUCOSE 136* 137* 120* 129*  BUN 14 18 21  25*  CREATININE 1.07* 1.16* 1.23* 1.23*  CALCIUM 8.6* 8.1* 8.1* 8.2*   Liver Function Tests: Recent Labs  Lab 08/21/19 1800 08/22/19 0431  AST 34 37  ALT 21 19  ALKPHOS 104 87  BILITOT 1.2 1.6*  PROT 7.5 6.7  ALBUMIN 3.2* 2.8*   No results for input(s): LIPASE, AMYLASE in the last 168 hours. No results for input(s): AMMONIA in the last 168  hours. CBC: Recent Labs  Lab 08/21/19 1800 08/22/19 0431 08/23/19 0500  WBC 18.2* 17.0* 10.2  NEUTROABS 15.2*  --  7.5  HGB 10.8* 9.5* 9.3*  HCT 35.8* 30.9* 30.5*  MCV 92.7 90.6 90.8  PLT 248 212 206   Cardiac Enzymes: No results for input(s): CKTOTAL, CKMB, CKMBINDEX, TROPONINI in the last 168 hours. BNP: Invalid input(s): POCBNP CBG: No results for input(s): GLUCAP in the last 168 hours. D-Dimer No results for input(s): DDIMER in the last 72 hours. Hgb A1c No results for input(s): HGBA1C in the last 72 hours. Lipid Profile No results for input(s): CHOL, HDL, LDLCALC, TRIG, CHOLHDL, LDLDIRECT in the last 72 hours. Thyroid function studies No results for input(s): TSH, T4TOTAL, T3FREE, THYROIDAB in the last 72 hours.  Invalid input(s): FREET3 Anemia work up No results for input(s): VITAMINB12, FOLATE, FERRITIN, TIBC, IRON, RETICCTPCT in the last 72 hours. Urinalysis    Component Value Date/Time   COLORURINE YELLOW 08/21/2019 1800   APPEARANCEUR TURBID (A) 08/21/2019 1800   LABSPEC 1.013 08/21/2019 1800   PHURINE 5.0 08/21/2019 1800   GLUCOSEU NEGATIVE 08/21/2019 1800   HGBUR MODERATE (A) 08/21/2019 1800   BILIRUBINUR NEGATIVE 08/21/2019 1800   KETONESUR NEGATIVE 08/21/2019 1800   PROTEINUR 100 (A) 08/21/2019 1800   UROBILINOGEN 0.2 07/03/2014 0313   NITRITE POSITIVE (A) 08/21/2019 1800   LEUKOCYTESUR LARGE (A) 08/21/2019 1800   Sepsis Labs Invalid input(s): PROCALCITONIN,  WBC,  LACTICIDVEN Microbiology Recent Results (from the past 240 hour(s))  Urine culture     Status: Abnormal   Collection Time: 08/21/19  6:00 PM   Specimen: In/Out Cath Urine  Result Value Ref Range Status  Specimen Description   Final    IN/OUT CATH URINE Performed at [facility] [address], [place], [place] [address]    Special Requests   Final    NONE Performed at [facility], [place] [address] [place], [place] [address]    Culture  >=100,000 COLONIES/mL ESCHERICHIA COLI (A)  Final   Report Status 08/23/2019 FINAL  Final   Organism ID, Bacteria ESCHERICHIA COLI (A)  Final      Susceptibility   Escherichia coli - MIC*    AMPICILLIN 16 INTERMEDIATE Intermediate     CEFAZOLIN <=4 SENSITIVE Sensitive     CEFTRIAXONE <=1 SENSITIVE Sensitive     CIPROFLOXACIN <=0.25 SENSITIVE Sensitive     GENTAMICIN <=1 SENSITIVE Sensitive     IMIPENEM <=0.25 SENSITIVE Sensitive     NITROFURANTOIN <=16 SENSITIVE Sensitive     TRIMETH/SULFA <=20 SENSITIVE Sensitive     AMPICILLIN/SULBACTAM 4 SENSITIVE Sensitive     PIP/TAZO <=4 SENSITIVE Sensitive     Extended ESBL NEGATIVE Sensitive     * >=100,000 COLONIES/mL ESCHERICHIA COLI  Blood Culture (routine x 2)     Status: Abnormal   Collection Time: 08/21/19  6:24 PM   Specimen: BLOOD RIGHT FOREARM  Result Value Ref Range Status   Specimen Description   Final    BLOOD RIGHT FOREARM Performed at [facility] [address] [place], [place] [address]    Special Requests   Final    BOTTLES DRAWN AEROBIC AND ANAEROBIC Blood Culture adequate volume Performed at [facility] [address] [place],  [address]    Culture  Setup Time (A)  Final    GRAM VARIABLE ROD AEROBIC BOTTLE ONLY CRITICAL RESULT CALLED TO, READ BACK BY AND VERIFIED WITH: Shelda Jakes PharmD 11:25 08/22/19 ([email or web address]) Performed at [facility], [address], [place], [place] [address]    Culture ESCHERICHIA COLI (A)  Final   Report Status 08/24/2019 FINAL  Final   Organism ID, Bacteria ESCHERICHIA COLI  Final      Susceptibility   Escherichia coli - MIC*    AMPICILLIN 16 INTERMEDIATE Intermediate     CEFAZOLIN <=4 SENSITIVE Sensitive     CEFEPIME <=1 SENSITIVE Sensitive     CEFTAZIDIME <=1 SENSITIVE Sensitive     CEFTRIAXONE <=1 SENSITIVE Sensitive     CIPROFLOXACIN <=0.25 SENSITIVE Sensitive     GENTAMICIN <=1 SENSITIVE Sensitive     IMIPENEM <=0.25 SENSITIVE  Sensitive     TRIMETH/SULFA <=20 SENSITIVE Sensitive     AMPICILLIN/SULBACTAM 4 SENSITIVE Sensitive     PIP/TAZO <=4 SENSITIVE Sensitive     Extended ESBL NEGATIVE Sensitive     * ESCHERICHIA COLI  Blood Culture ID Panel (Reflexed)     Status: Abnormal   Collection Time: 08/21/19  6:24 PM  Result Value Ref Range Status   Enterococcus species NOT DETECTED NOT DETECTED Final   Listeria monocytogenes NOT DETECTED NOT DETECTED Final   Staphylococcus species NOT DETECTED NOT DETECTED Final   Staphylococcus aureus (BCID) NOT DETECTED NOT DETECTED Final   Streptococcus species NOT DETECTED NOT DETECTED Final   Streptococcus agalactiae NOT DETECTED NOT DETECTED Final   Streptococcus pneumoniae NOT DETECTED NOT DETECTED Final   Streptococcus pyogenes NOT DETECTED NOT DETECTED Final   Acinetobacter baumannii NOT DETECTED NOT DETECTED Final   Enterobacteriaceae species DETECTED (A) NOT DETECTED Final    Comment: Enterobacteriaceae represent a large family of gram-negative bacteria, not a single organism.  CRITICAL RESULT CALLED TO, READ BACK BY AND VERIFIED WITH: Shelda Jakes PharmD 11:25 08/22/19 ([email or web address])    Enterobacter cloacae complex NOT DETECTED NOT DETECTED Final   Escherichia coli DETECTED (A) NOT DETECTED Final    Comment: CRITICAL RESULT CALLED TO, READ BACK BY AND VERIFIED WITH: Shelda Jakes PharmD 11:25 08/22/19 ([email or web address])    Klebsiella oxytoca NOT DETECTED NOT DETECTED Final   Klebsiella pneumoniae NOT DETECTED NOT DETECTED Final   Proteus species NOT DETECTED NOT DETECTED Final   Serratia marcescens NOT DETECTED NOT DETECTED Final   Carbapenem resistance NOT DETECTED NOT DETECTED Final   Haemophilus influenzae NOT DETECTED NOT DETECTED Final   Neisseria meningitidis NOT DETECTED NOT DETECTED Final   Pseudomonas aeruginosa NOT DETECTED NOT DETECTED Final   Candida albicans NOT DETECTED NOT DETECTED Final   Candida glabrata NOT DETECTED NOT DETECTED Final   Candida krusei NOT DETECTED  NOT DETECTED Final   Candida parapsilosis NOT DETECTED NOT DETECTED Final   Candida tropicalis NOT DETECTED NOT DETECTED Final    Comment: Performed at [facility], [place] [address] [place], [place] [address]  Blood Culture (routine x 2)     Status: Abnormal   Collection Time: 08/21/19  6:26 PM   Specimen: BLOOD  Result Value Ref Range Status   Specimen Description   Final    BLOOD RIGHT ANTECUBITAL Performed at [facility] [address] [place], [place] [address]    Special Requests   Final    BOTTLES DRAWN AEROBIC AND ANAEROBIC Blood Culture adequate volume Performed at [facility] [address], [place], [place] [address]    Culture  Setup Time   Final    GRAM NEGATIVE RODS AEROBIC BOTTLE ONLY CRITICAL VALUE NOTED.  VALUE IS CONSISTENT WITH PREVIOUSLY REPORTED AND CALLED VALUE.    Culture (A)  Final    ESCHERICHIA COLI SUSCEPTIBILITIES PERFORMED ON PREVIOUS CULTURE WITHIN THE LAST 5 DAYS. Performed at [facility], [place] [address], [place], [place] [address]    Report Status 08/24/2019 FINAL  Final  SARS CORONAVIRUS 2 (TAT 6-24 HRS) Nasopharyngeal Nasopharyngeal Swab     Status: None   Collection Time: 08/21/19  9:30 PM   Specimen: Nasopharyngeal Swab  Result Value Ref Range Status   SARS Coronavirus 2 NEGATIVE NEGATIVE Final    Comment: (NOTE) SARS-CoV-2 target nucleic acids are NOT DETECTED. The SARS-CoV-2 RNA is generally detectable in upper and lower respiratory specimens during the acute phase of infection. Negative results do not preclude SARS-CoV-2 infection, do not rule out co-infections with other pathogens, and should not be used as the sole basis for treatment or other patient management decisions. Negative results must be combined with clinical observations, patient history, and epidemiological information. The expected result is Negative. Fact Sheet for  Patients: SugarRoll.be Fact Sheet for Healthcare Providers: https://www.woods-mathews.com/ This test is not yet approved or cleared by the [place] FDA and  has been authorized for detection and/or diagnosis of SARS-CoV-2 by FDA under an Emergency Use Authorization (EUA). This EUA will remain  in effect (meaning this test can be used) for the duration of the COVID-19 declaration under Section 56 4(b)(1) of the Act, 21 U.S.C. section 360bbb-3(b)(1), unless the authorization is terminated or revoked sooner. Performed at [facility], [place] [address] [place], [place] [address]      Time coordinating discharge: Over 30 minutes  SIGNED:   Nicolette Bang, MD  [facility] 08/24/2019, 1:02 PM Pager  If 7PM-7AM, please contact night-coverage [email or web address] Password [email or web address]

## 2019-08-24 NOTE — TOC Progression Note (Signed)
Transition of Care [facility]) - Progression Note    Patient Details  Name: Mirinda Welcher MRN: [id number] Date of Birth: 06-Jul-1927  Transition of Care [facility]) CM/SW Contact  Joaquin Courts, RN Phone Number: 08/24/2019, 12:15 PM  Clinical Narrative:    CM spoke with patient at bedside. Patient declines home health services. Reports has rolling walker at home and declines 3-in-1.    Expected Discharge Plan: Home/Self Care Barriers to Discharge: Continued Medical Work up  Expected Discharge Plan and Services Expected Discharge Plan: Home/Self Care   Discharge Planning Services: CM Consult   Living arrangements for the past 2 months: Apartment                 DME Arranged: N/A DME Agency: NA       HH Arranged: Patient Refused HH           Social Determinants of Health (SDOH) Interventions    Readmission Risk Interventions No flowsheet data found.

## 2019-08-24 NOTE — Progress Notes (Signed)
Pt complaining of tightness in chest and unable to catch her breath. Vitals stable, EKG obtained and placed in chart for review. MD Spongberg made aware. Will continue to monitor patient.

## 2019-08-26 ENCOUNTER — Telehealth: Payer: Self-pay | Admitting: Nurse Practitioner

## 2019-08-26 ENCOUNTER — Telehealth: Payer: Self-pay | Admitting: *Deleted

## 2019-08-26 NOTE — Telephone Encounter (Signed)
[name] please advise/FYI   Copied from [place] [id number]. Topic: General - Other >> Aug 26, 2019 10:08 AM Pauline Good wrote: Reason for CRM: stated pt refused there services for PT. FYI

## 2019-08-26 NOTE — Telephone Encounter (Signed)
Please forward note to [facility] to complete TCM call and schedule hospital f/up appt. We can discuss PT referral during this visit.

## 2019-08-26 NOTE — Telephone Encounter (Signed)
LVM for pt to call office. Pt needs to schedule appt.

## 2019-08-26 NOTE — Telephone Encounter (Signed)
Spoke with the pt, she stated she is not too happy with PT services and what they want the pt and care taker to do at home (that's the reason of denied PT).   Offer a Hospital follow up for the pt but pt denied for right now--pt will call back once is has more energy to come out.   [name]--FYI Angel--FYI

## 2019-08-28 ENCOUNTER — Telehealth: Payer: Self-pay | Admitting: Nurse Practitioner

## 2019-08-28 DIAGNOSIS — N390 Urinary tract infection, site not specified: Secondary | ICD-10-CM

## 2019-08-28 MED ORDER — CIPROFLOXACIN HCL 250 MG PO TABS: 250.0000 mg | ORAL_TABLET | Freq: Two times a day (BID) | ORAL | 0 refills | Status: DC

## 2019-08-28 NOTE — Telephone Encounter (Signed)
[name] is aware

## 2019-08-28 NOTE — Telephone Encounter (Signed)
Pt called stated that ED gave her Cefdinir to take but it is making her nauseous and watery diarrhea since she start to take this med 2 days ago with food. Pt stop taking this med denied other symptoms but pt wants to know what else to do.     Copied from [place] [phone]. Topic: General - Other >> Aug 28, 2019  2:02 PM Yvette Rack wrote: Reason for CRM: Pt stated she needs to speak with Wilfred Lacy or her nurse to discuss a medication. Pt requests call back >> Aug 28, 2019  3:12 PM Kyen Taite, LPN wrote: Left VM for the pt to call back.

## 2019-08-28 NOTE — Telephone Encounter (Signed)
cipro sent x 5days. Start probiotic BID with oral She need to make appt with urology asap.

## 2019-09-02 ENCOUNTER — Other Ambulatory Visit: Payer: Self-pay

## 2019-09-02 ENCOUNTER — Inpatient Hospital Stay (HOSPITAL_COMMUNITY)
Admission: EM | Admit: 2019-09-02 | Discharge: 2019-09-05 | DRG: 291 | Disposition: A | Discharge status: Home / Self Care | Payer: Medicare Other | Attending: Internal Medicine | Admitting: Internal Medicine

## 2019-09-02 ENCOUNTER — Emergency Department (HOSPITAL_COMMUNITY): Payer: Medicare Other

## 2019-09-02 ENCOUNTER — Telehealth: Payer: Self-pay | Admitting: Nurse Practitioner

## 2019-09-02 ENCOUNTER — Ambulatory Visit: Payer: Self-pay

## 2019-09-02 ENCOUNTER — Encounter (HOSPITAL_COMMUNITY): Payer: Self-pay | Admitting: Emergency Medicine

## 2019-09-02 DIAGNOSIS — I429 Cardiomyopathy, unspecified: Secondary | ICD-10-CM | POA: Diagnosis present

## 2019-09-02 DIAGNOSIS — K449 Diaphragmatic hernia without obstruction or gangrene: Secondary | ICD-10-CM | POA: Diagnosis present

## 2019-09-02 DIAGNOSIS — R0609 Other forms of dyspnea: Secondary | ICD-10-CM

## 2019-09-02 DIAGNOSIS — I4821 Permanent atrial fibrillation: Secondary | ICD-10-CM | POA: Diagnosis present

## 2019-09-02 DIAGNOSIS — Z880 Allergy status to penicillin: Secondary | ICD-10-CM | POA: Diagnosis not present

## 2019-09-02 DIAGNOSIS — J9690 Respiratory failure, unspecified, unspecified whether with hypoxia or hypercapnia: Secondary | ICD-10-CM

## 2019-09-02 DIAGNOSIS — I482 Chronic atrial fibrillation, unspecified: Secondary | ICD-10-CM | POA: Diagnosis not present

## 2019-09-02 DIAGNOSIS — E876 Hypokalemia: Secondary | ICD-10-CM | POA: Diagnosis present

## 2019-09-02 DIAGNOSIS — I5031 Acute diastolic (congestive) heart failure: Secondary | ICD-10-CM

## 2019-09-02 DIAGNOSIS — Z20828 Contact with and (suspected) exposure to other viral communicable diseases: Secondary | ICD-10-CM | POA: Diagnosis present

## 2019-09-02 DIAGNOSIS — I509 Heart failure, unspecified: Secondary | ICD-10-CM

## 2019-09-02 DIAGNOSIS — I350 Nonrheumatic aortic (valve) stenosis: Secondary | ICD-10-CM | POA: Diagnosis not present

## 2019-09-02 DIAGNOSIS — Z885 Allergy status to narcotic agent status: Secondary | ICD-10-CM

## 2019-09-02 DIAGNOSIS — Z888 Allergy status to other drugs, medicaments and biological substances status: Secondary | ICD-10-CM | POA: Diagnosis not present

## 2019-09-02 DIAGNOSIS — I10 Essential (primary) hypertension: Secondary | ICD-10-CM | POA: Diagnosis not present

## 2019-09-02 DIAGNOSIS — G629 Polyneuropathy, unspecified: Secondary | ICD-10-CM | POA: Diagnosis present

## 2019-09-02 DIAGNOSIS — Z8249 Family history of ischemic heart disease and other diseases of the circulatory system: Secondary | ICD-10-CM

## 2019-09-02 DIAGNOSIS — I361 Nonrheumatic tricuspid (valve) insufficiency: Secondary | ICD-10-CM | POA: Diagnosis not present

## 2019-09-02 DIAGNOSIS — E8770 Fluid overload, unspecified: Secondary | ICD-10-CM

## 2019-09-02 DIAGNOSIS — D72829 Elevated white blood cell count, unspecified: Secondary | ICD-10-CM | POA: Diagnosis present

## 2019-09-02 DIAGNOSIS — J9600 Acute respiratory failure, unspecified whether with hypoxia or hypercapnia: Secondary | ICD-10-CM | POA: Diagnosis not present

## 2019-09-02 DIAGNOSIS — E039 Hypothyroidism, unspecified: Secondary | ICD-10-CM | POA: Diagnosis present

## 2019-09-02 DIAGNOSIS — I5041 Acute combined systolic (congestive) and diastolic (congestive) heart failure: Secondary | ICD-10-CM | POA: Diagnosis not present

## 2019-09-02 DIAGNOSIS — I5043 Acute on chronic combined systolic (congestive) and diastolic (congestive) heart failure: Secondary | ICD-10-CM | POA: Diagnosis present

## 2019-09-02 DIAGNOSIS — J9601 Acute respiratory failure with hypoxia: Secondary | ICD-10-CM | POA: Diagnosis present

## 2019-09-02 DIAGNOSIS — I130 Hypertensive heart and chronic kidney disease with heart failure and stage 1 through stage 4 chronic kidney disease, or unspecified chronic kidney disease: Principal | ICD-10-CM | POA: Diagnosis present

## 2019-09-02 DIAGNOSIS — E785 Hyperlipidemia, unspecified: Secondary | ICD-10-CM | POA: Diagnosis present

## 2019-09-02 DIAGNOSIS — F419 Anxiety disorder, unspecified: Secondary | ICD-10-CM | POA: Diagnosis present

## 2019-09-02 DIAGNOSIS — D631 Anemia in chronic kidney disease: Secondary | ICD-10-CM | POA: Diagnosis present

## 2019-09-02 DIAGNOSIS — Z7982 Long term (current) use of aspirin: Secondary | ICD-10-CM

## 2019-09-02 DIAGNOSIS — N289 Disorder of kidney and ureter, unspecified: Secondary | ICD-10-CM | POA: Diagnosis not present

## 2019-09-02 DIAGNOSIS — R7989 Other specified abnormal findings of blood chemistry: Secondary | ICD-10-CM

## 2019-09-02 DIAGNOSIS — I4819 Other persistent atrial fibrillation: Secondary | ICD-10-CM | POA: Diagnosis present

## 2019-09-02 DIAGNOSIS — Z87891 Personal history of nicotine dependence: Secondary | ICD-10-CM

## 2019-09-02 DIAGNOSIS — Z825 Family history of asthma and other chronic lower respiratory diseases: Secondary | ICD-10-CM | POA: Diagnosis not present

## 2019-09-02 DIAGNOSIS — M069 Rheumatoid arthritis, unspecified: Secondary | ICD-10-CM | POA: Diagnosis present

## 2019-09-02 DIAGNOSIS — Z886 Allergy status to analgesic agent status: Secondary | ICD-10-CM

## 2019-09-02 DIAGNOSIS — I083 Combined rheumatic disorders of mitral, aortic and tricuspid valves: Secondary | ICD-10-CM | POA: Diagnosis present

## 2019-09-02 DIAGNOSIS — N182 Chronic kidney disease, stage 2 (mild): Secondary | ICD-10-CM | POA: Diagnosis present

## 2019-09-02 DIAGNOSIS — R0602 Shortness of breath: Secondary | ICD-10-CM

## 2019-09-02 DIAGNOSIS — I110 Hypertensive heart disease with heart failure: Secondary | ICD-10-CM | POA: Diagnosis not present

## 2019-09-02 DIAGNOSIS — I700 Atherosclerosis of aorta: Secondary | ICD-10-CM | POA: Diagnosis present

## 2019-09-02 DIAGNOSIS — Z79890 Hormone replacement therapy (postmenopausal): Secondary | ICD-10-CM | POA: Diagnosis not present

## 2019-09-02 HISTORY — DX: Heart failure, unspecified: I50.9

## 2019-09-02 LAB — CBC WITH DIFFERENTIAL/PLATELET
Abs Immature Granulocytes: 0.06 10*3/uL (ref 0.00–0.07)
Basophils Absolute: 0.1 10*3/uL (ref 0.0–0.1)
Basophils Relative: 1 %
Eosinophils Absolute: 0.2 10*3/uL (ref 0.0–0.5)
Eosinophils Relative: 2 %
HCT: 33 % — ABNORMAL LOW (ref 36.0–46.0)
Hemoglobin: 9.9 g/dL — ABNORMAL LOW (ref 12.0–15.0)
Immature Granulocytes: 1 %
Lymphocytes Relative: 16 %
Lymphs Abs: 1.8 10*3/uL (ref 0.7–4.0)
MCH: 26.8 pg (ref 26.0–34.0)
MCHC: 30 g/dL (ref 30.0–36.0)
MCV: 89.4 fL (ref 80.0–100.0)
Monocytes Absolute: 0.6 10*3/uL (ref 0.1–1.0)
Monocytes Relative: 5 %
Neutro Abs: 8.7 10*3/uL — ABNORMAL HIGH (ref 1.7–7.7)
Neutrophils Relative %: 75 %
Platelets: 463 10*3/uL — ABNORMAL HIGH (ref 150–400)
RBC: 3.69 MIL/uL — ABNORMAL LOW (ref 3.87–5.11)
RDW: 15.7 % — ABNORMAL HIGH (ref 11.5–15.5)
WBC: 11.4 10*3/uL — ABNORMAL HIGH (ref 4.0–10.5)
nRBC: 0 % (ref 0.0–0.2)

## 2019-09-02 LAB — COMPREHENSIVE METABOLIC PANEL
ALT: 14 U/L (ref 0–44)
AST: 25 U/L (ref 15–41)
Albumin: 2.9 g/dL — ABNORMAL LOW (ref 3.5–5.0)
Alkaline Phosphatase: 101 U/L (ref 38–126)
Anion gap: 9 (ref 5–15)
BUN: 9 mg/dL (ref 8–23)
CO2: 26 mmol/L (ref 22–32)
Calcium: 8.1 mg/dL — ABNORMAL LOW (ref 8.9–10.3)
Chloride: 102 mmol/L (ref 98–111)
Creatinine, Ser: 0.83 mg/dL (ref 0.44–1.00)
GFR calc Af Amer: >60 mL/min (ref >60)
GFR calc non Af Amer: >60 mL/min (ref >60)
Glucose, Bld: 104 mg/dL — ABNORMAL HIGH (ref 70–99)
Potassium: 3.3 mmol/L — ABNORMAL LOW (ref 3.5–5.1)
Sodium: 137 mmol/L (ref 135–145)
Total Bilirubin: 1 mg/dL (ref 0.3–1.2)
Total Protein: 6.8 g/dL (ref 6.5–8.1)

## 2019-09-02 LAB — D-DIMER, QUANTITATIVE: D-Dimer, Quant: 2.38 ug/mL-FEU — ABNORMAL HIGH (ref 0.00–0.50)

## 2019-09-02 LAB — BRAIN NATRIURETIC PEPTIDE: B Natriuretic Peptide: 1352.1 pg/mL — ABNORMAL HIGH (ref 0.0–100.0)

## 2019-09-02 LAB — TROPONIN I (HIGH SENSITIVITY)
Troponin I (High Sensitivity): 23 ng/L — ABNORMAL HIGH (ref <18)
Troponin I (High Sensitivity): 25 ng/L — ABNORMAL HIGH (ref <18)

## 2019-09-02 MED ORDER — FUROSEMIDE 10 MG/ML IJ SOLN
40.0000 mg | Freq: Once | INTRAMUSCULAR | Status: AC
  Administered 2019-09-02: 19:00:00 40 mg via INTRAVENOUS
  Filled 2019-09-02: qty 4

## 2019-09-02 MED ORDER — SODIUM CHLORIDE 0.9% FLUSH
3.0000 mL | Freq: Two times a day (BID) | INTRAVENOUS | Status: DC
  Administered 2019-09-02 – 2019-09-04 (×5): 3 mL via INTRAVENOUS

## 2019-09-02 MED ORDER — ACETAMINOPHEN 650 MG RE SUPP: 650.0000 mg | Freq: Four times a day (QID) | RECTAL | Status: DC | PRN

## 2019-09-02 MED ORDER — ACETAMINOPHEN 325 MG PO TABS
650.0000 mg | ORAL_TABLET | Freq: Four times a day (QID) | ORAL | Status: DC | PRN
  Administered 2019-09-03: 03:00:00 650 mg via ORAL
  Filled 2019-09-02: qty 2

## 2019-09-02 MED ORDER — ENOXAPARIN SODIUM 40 MG/0.4ML ~~LOC~~ SOLN
40.0000 mg | SUBCUTANEOUS | Status: DC
  Administered 2019-09-02 – 2019-09-03 (×2): 40 mg via SUBCUTANEOUS
  Filled 2019-09-02 (×2): qty 0.4

## 2019-09-02 MED ORDER — SODIUM CHLORIDE 0.9 % IV SOLN
250.0000 mL | INTRAVENOUS | Status: DC | PRN
  Administered 2019-09-03: 250 mL via INTRAVENOUS

## 2019-09-02 MED ORDER — SODIUM CHLORIDE 0.9% FLUSH
3.0000 mL | INTRAVENOUS | Status: DC | PRN
  Administered 2019-09-05: 3 mL via INTRAVENOUS
  Filled 2019-09-02: qty 3

## 2019-09-02 MED ORDER — FUROSEMIDE 10 MG/ML IJ SOLN
40.0000 mg | Freq: Every day | INTRAMUSCULAR | Status: DC
  Administered 2019-09-03 – 2019-09-04 (×2): 40 mg via INTRAVENOUS
  Filled 2019-09-02 (×3): qty 4

## 2019-09-02 NOTE — Telephone Encounter (Signed)
Please advised patient and son to schedule office visit to discuss and evaluate for oxygen need. Thank you

## 2019-09-02 NOTE — Telephone Encounter (Signed)
[name] please advise, you need to see the pt first right?    Copied from [place] [id number]. Topic: General - Inquiry >> Sep 02, 2019  8:07 AM Richardo Priest, NT wrote: Reason for CRM: Pt's son called in stating he would like if PCP can prescribe his mother oxygen as she was on it while she was in hospital, and feels now she is affected without it. Pt's son call back is [phone]. Please advise.

## 2019-09-02 NOTE — ED Provider Notes (Signed)
[facility] DEPT Provider Note   CSN: [id number] Arrival date & time: 09/02/19  1639     History   Chief Complaint Chief Complaint  Patient presents with   Shortness of Breath    HPI [name] is a 85 y.o. female.  Presents emerged from with chief complaint shortness of breath.  Patient states that she has been short of breath over the past couple weeks.  States this started prior to her last admission.  States shortness of breath is worse with any sort of exertion.  But does have some shortness of breath rest.  Worse with lying flat.  States that she has had no cough, no fever.  She denies any leg pain or leg swelling.  Has been taking her prescribed cephalexin.  Recent admission for urosepsis, E. coli bacteremia.  History of atrial fibrillation not on anticoagulation.  Aortic stenosis, last echo in 2015 demonstrated mild AS.  Hyperlipidemia, hypertension, rheumatoid arthritis.     HPI  Past Medical History:  Diagnosis Date   A-fib [facility])    Aortic stenosis, moderate    last echo in 2010; AVA 1.1; mild AS per echo in June 2013   Edema of both legs    s/p laser treatment per Dr. Donnetta Hutching   Hiatal hernia    History of chicken pox    HLD (hyperlipidemia)    HTN (hypertension)    Hypothyroidism    IBS (irritable bowel syndrome)    Iron deficiency anemia    Neuropathic pain 09/06/2015   Rheumatoid arthritis ([place])     Patient Active Problem List   Diagnosis Date Noted   UTI (urinary tract infection) 08/21/2019   Colovesical fistula 05/28/2019   Polyneuropathy 11/06/2018   Goals of care, counseling/discussion    Hydronephrosis 10/13/2018   Ovarian mass 10/13/2018   C. difficile colitis 10/13/2018   Clostridium difficile colitis 09/24/2018   Hypokalemia    Hypomagnesemia    Left ovarian cyst    Acute diverticulitis 09/07/2018   Atrial fibrillation with rapid ventricular response ([place]) 09/06/2018    Leucocytosis 09/06/2018   A-fib ([place]) 09/06/2018   Malnutrition of moderate degree 07/26/2018   Perforation of sigmoid colon due to diverticulitis 07/25/2018   Thrombocytosis ([place]) 07/25/2018   Sepsis ([place]) 07/24/2018   Generalized abdominal pain 02/06/2018   Paresthesia of both lower extremities 02/06/2018   CKD (chronic kidney disease) stage 2, GFR 60-89 ml/min 01/07/2018   Normocytic normochromic anemia 01/07/2018   Hyperglycemia 01/07/2018   Iron deficiency 09/19/2017   Pyoderma gangrenosum 01/04/2017   Primary osteoarthritis of both hands 01/04/2017   Primary osteoarthritis of both feet 01/04/2017   Primary osteoarthritis of both knees 01/04/2017   Degenerative joint disease involving multiple joints 01/04/2017   Neuropathic pain 09/06/2015   Erythema nodosum 09/21/2014   Rheumatoid arthritis ([place]) 09/21/2014   Essential hypertension 09/02/2014   Rectal bleeding 05/15/2014   Atrial fibrillation, chronic 05/11/2014   HLD (hyperlipidemia) 04/02/2014   Hypothyroidism 04/02/2014   PAC (premature atrial contraction) 01/23/2014   Atrial tachycardia, paroxysmal ([place]) 01/23/2014   Near syncope 01/22/2014   Varicose veins of lower extremities with other complications [id number]   Localized edema 06/21/2012   Venous insufficiency of both lower extremities 04/19/2012   Leg edema 01/17/2012   Anxiety 11/05/2011   Aortic stenosis 03/22/2011    Past Surgical History:  Procedure Laterality Date   APPENDECTOMY  1955   CARPAL TUNNEL RELEASE Right    CATARACT EXTRACTION  ENDOSCOPIC VEIN LASER TREATMENT     ENDOVENOUS ABLATION SAPHENOUS VEIN W/ LASER  08-29-2012   left greater saphenous vein   Curt Jews MD   ENDOVENOUS ABLATION SAPHENOUS VEIN W/ LASER  09-19-2012   right greater saphenous vein by Curt Jews MD   EYE SURGERY  2005   Bilateral cataract   FOOT SURGERY  2003   bilateral Hammer Toe   stab phlebectomy Right 01-02-2013    10-15 incisions right thigh and calf by Curt Jews MD     OB History   No obstetric history on file.      Home Medications    Prior to Admission medications   Medication Sig Start Date End Date Taking? Authorizing Provider  acetaminophen (TYLENOL) 500 MG tablet Take 1,000 mg by mouth every 8 (eight) hours as needed (joint pain).     [provider]  ALPRAZolam Duanne Moron) 0.25 MG tablet Take 1 tablet (0.25 mg total) by mouth daily as needed for anxiety. 07/26/19   Nche, Charlene Brooke, NP  aspirin 81 MG chewable tablet Chew 1 tablet (81 mg total) by mouth daily. 10/26/18   Hennie Duos, MD  cefdinir (OMNICEF) 300 MG capsule Take 1 capsule (300 mg total) by mouth 2 (two) times daily for 12 days. 08/24/19 09/05/19  Spongberg, Audie Pinto, MD  ciprofloxacin (CIPRO) 250 MG tablet Take 1 tablet (250 mg total) by mouth 2 (two) times daily. 08/28/19   Nche, Charlene Brooke, NP  gabapentin (NEURONTIN) 100 MG capsule Take 1 capsule (100 mg total) by mouth at bedtime. 06/30/19   Nche, Charlene Brooke, NP  levothyroxine (SYNTHROID) 75 MCG tablet Take 1 tablet (75 mcg total) by mouth daily before breakfast. 07/05/19   Nche, Charlene Brooke, NP  metoprolol succinate (TOPROL-XL) 25 MG 24 hr tablet Take 3 tablets (75 mg total) by mouth daily. 06/30/19   Nche, Charlene Brooke, NP  Multiple Vitamin (MULTIVITAMIN WITH MINERALS) TABS tablet Take 1 tablet by mouth daily. Patient not taking: Reported on 08/22/2019 09/27/18   Raiford Noble Latif, DO  Olopatadine HCl 0.2 % SOLN Place 1 drop into both eyes daily. Patient taking differently: Place 1 drop into both eyes 4 (four) times daily.  10/26/18   Hennie Duos, MD    Family History Family History  Problem Relation Age of Onset   Heart disease Mother    Peripheral vascular disease Mother    Heart disease Father    Peripheral vascular disease Father        Right leg amputation   COPD Father    Heart disease Brother 70       Heart Disease  before age 77   Heart attack Brother    Peripheral vascular disease Other    Hypertension Sister     Social History Social History   Tobacco Use   Smoking status: Former Smoker    Types: Cigarettes    Quit date: 10/31/1983    Years since quitting: 35.8   Smokeless tobacco: Never Used  Substance Use Topics   Alcohol use: No   Drug use: No     Allergies   Infliximab, Aspirin, Carvedilol, Codeine, and Penicillins   Review of Systems Review of Systems  Constitutional: Negative for chills and fever.  HENT: Negative for ear pain and sore throat.   Eyes: Negative for pain and visual disturbance.  Respiratory: Positive for shortness of breath. Negative for cough.   Cardiovascular: Negative for chest pain and palpitations.  Gastrointestinal: Negative for abdominal pain  and vomiting.  Genitourinary: Negative for dysuria and hematuria.  Musculoskeletal: Negative for arthralgias and back pain.  Skin: Negative for color change and rash.  Neurological: Negative for seizures and syncope.  All other systems reviewed and are negative.    Physical Exam Updated Vital Signs BP (!) 159/88    Pulse 86    Temp 99.1 F (37.3 C) (Oral)    Resp 20    Ht 5' (1.524 m)    Wt 54.4 kg    SpO2 99%    BMI 23.44 kg/m   Physical Exam Vitals signs and nursing note reviewed.  Constitutional:      General: She is not in acute distress.    Appearance: She is well-developed.  HENT:     Head: Normocephalic and atraumatic.  Eyes:     Conjunctiva/sclera: Conjunctivae normal.  Neck:     Musculoskeletal: Neck supple.  Cardiovascular:     Rate and Rhythm: Normal rate and regular rhythm.     Heart sounds: No murmur.  Pulmonary:     Effort: Pulmonary effort is normal.     Comments: Somewhat diminished breath sounds in the bases, mild tachypnea Abdominal:     Palpations: Abdomen is soft.     Tenderness: There is no abdominal tenderness.  Skin:    General: Skin is warm and dry.  Neurological:      General: No focal deficit present.     Mental Status: She is alert.      ED Treatments / Results  Labs (all labs ordered are listed, but only abnormal results are displayed) Labs Reviewed  CBC WITH DIFFERENTIAL/PLATELET - Abnormal; Notable for the following components:      Result Value   WBC 11.4 (*)    RBC 3.69 (*)    Hemoglobin 9.9 (*)    HCT 33.0 (*)    RDW 15.7 (*)    Platelets 463 (*)    Neutro Abs 8.7 (*)    All other components within normal limits  COMPREHENSIVE METABOLIC PANEL - Abnormal; Notable for the following components:   Potassium 3.3 (*)    Glucose, Bld 104 (*)    Calcium 8.1 (*)    Albumin 2.9 (*)    All other components within normal limits  D-DIMER, QUANTITATIVE (NOT AT [facility]) - Abnormal; Notable for the following components:   D-Dimer, Quant 2.38 (*)    All other components within normal limits  TROPONIN I (HIGH SENSITIVITY) - Abnormal; Notable for the following components:   Troponin I (High Sensitivity) 23 (*)    All other components within normal limits  BRAIN NATRIURETIC PEPTIDE    EKG None  Radiology Dg Chest Port 1 View  Result Date: 09/02/2019 CLINICAL DATA:  Shortness of breath for 1 week EXAM: PORTABLE CHEST 1 VIEW COMPARISON:  Radiograph 08/24/2019 FINDINGS: Increasing hazy interstitial opacities throughout the lungs with cephalized, indistinct pulmonary vascularity and some septal thickening in the lung periphery. Hazy opacity obscures both hemidiaphragms suggesting a bilateral pleural effusions. Cardiomegaly is similar to comparison though portions of the cardiac borders are obscured by overlying opacity. The aorta is calcified. No pneumothorax. No acute osseous or soft tissue abnormality. Known hiatal hernia is poorly visualized. IMPRESSION: Features suggesting CHF/volume overload with cardiomegaly, interstitial edema and bilateral effusions. Aortic Atherosclerosis (ICD10-I70.0). Electronically Signed   By: Lovena Le M.D.   On:  09/02/2019 18:07    Procedures Procedures (including critical care time)  Medications Ordered in ED Medications - No data to display  Initial Impression / Assessment and Plan / ED Course  I have reviewed the triage vital signs and the nursing notes.  Pertinent labs & imaging results that were available during my care of the patient were reviewed by me and considered in my medical decision making (see chart for details).        85 year old lady presented to the ER with worsening dyspnea on exertion.  Past medical history of A. fib, mild AS, recent admission for urosepsis.  Chest x-ray concerning for significant bilateral edema, pleural effusions.  Suspect fluid overload as etiology for her symptoms.  Will start Lasix.  Had check D-dimer given exertional dyspnea which was elevated.  Patient refusing CT despite my best efforts to convince her otherwise. Will need a new echo regardless while in hospital. Discussed this point with hosptialist.  Most likely etiology for her symptoms today is the fluid overload state.    Dr. Maudie Mercury will accept patient.  Final Clinical Impressions(s) / ED Diagnoses   Final diagnoses:  Respiratory failure, unspecified chronicity, unspecified whether with hypoxia or hypercapnia (HCC)  Dyspnea on exertion  Elevated brain natriuretic peptide (BNP) level  Hypervolemia, unspecified hypervolemia type  Acute heart failure, unspecified heart failure type [facility])    ED Discharge Orders    None       Lucrezia Starch, MD 09/02/19 2002

## 2019-09-02 NOTE — ED Triage Notes (Signed)
Patient arrived by EMS from home.   Pt c/o SOB x 1 week.   Pt has history of Respiratory issues of unknown cause. Patient was seen in ED 08/21/2019.

## 2019-09-02 NOTE — Telephone Encounter (Signed)
Please help call and offer an appt for the pt.

## 2019-09-02 NOTE — H&P (Signed)
TRH H&P    Patient Demographics:    [name], is a 85 y.o. female  MRN: [id number]  DOB - 08/30/1927  Admit Date - 09/02/2019  Referring MD/NP/PA: Roslynn Amble  Outpatient Primary MD for the patient is Nche, Charlene Brooke, NP  Patient coming from:  home  Chief complaint- dyspnea   HPI:    [name]  is a 85 y.o. female, w hypothyroidism, iron deficiency anemia, RA, hypertension, hyperlipidemia, Pafib, Aortic stenosis, was recently admitted on 08/21/2019, E. Coli sepsis, presents with dyspnea since discharge. Pt has had slight dry cough, pt denies fever, chills, cp, palp, n/v, abd, pain, dysuria, hematuria.  Pt denies orthopnea, pnd, weight gain.   In ED,  T 99.1  P 88 R 21, Bp 168/105  Pox 98% on RA Wt 54.4 kg  CXR IMPRESSION: Features suggesting CHF/volume overload with cardiomegaly, interstitial edema and bilateral effusions.  Aortic Atherosclerosis (ICD10-I70.0).  Wbc 11.4, Hgb 9.9 Plt 463  Na 137, K 3.3, Bun 9, Creatinine 0.83 Ast 25, Alt 14 D dimer 2.38 Trop 23 BNP 1,352.1  Ekg not in computer, will reorder.   Pt wil be admitted for acute CHF    Review of systems:    In addition to the HPI above,  No Fever-chills, No Headache, No changes with Vision or hearing, No problems swallowing food or Liquids, No Chest pain,   No Abdominal pain, No Nausea or Vomiting, bowel movements are regular, No Blood in stool or Urine, No dysuria, No new skin rashes or bruises, No new joints pains-aches,  No new weakness, tingling, numbness in any extremity, No recent weight gain or loss, No polyuria, polydypsia or polyphagia, No significant Mental Stressors.  All other systems reviewed and are negative.    Past History of the following :    Past Medical History:  Diagnosis Date  . A-fib ([place])   . Aortic stenosis, moderate    last echo in 2010; AVA 1.1; mild AS per echo in June 2013   . Edema of both legs    s/p laser treatment per Dr. Donnetta Hutching  . Hiatal hernia   . History of chicken pox   . HLD (hyperlipidemia)   . HTN (hypertension)   . Hypothyroidism   . IBS (irritable bowel syndrome)   . Iron deficiency anemia   . Neuropathic pain 09/06/2015  . Rheumatoid arthritis [facility])       Past Surgical History:  Procedure Laterality Date  . APPENDECTOMY  1955  . CARPAL TUNNEL RELEASE Right   . CATARACT EXTRACTION    . ENDOSCOPIC VEIN LASER TREATMENT    . ENDOVENOUS ABLATION SAPHENOUS VEIN W/ LASER  08-29-2012   left greater saphenous vein   Sherren Mocha Early MD  . ENDOVENOUS ABLATION SAPHENOUS VEIN W/ LASER  09-19-2012   right greater saphenous vein by Curt Jews MD  . EYE SURGERY  2005   Bilateral cataract  . FOOT SURGERY  2003   bilateral Hammer Toe  . stab phlebectomy Right 01-02-2013   10-15 incisions right thigh and calf by  Curt Jews MD      Social History:      Social History   Tobacco Use  . Smoking status: Former Smoker    Types: Cigarettes    Quit date: 10/31/1983    Years since quitting: 35.8  . Smokeless tobacco: Never Used  Substance Use Topics  . Alcohol use: No       Family History :     Family History  Problem Relation Age of Onset  . Heart disease Mother   . Peripheral vascular disease Mother   . Heart disease Father   . Peripheral vascular disease Father        Right leg amputation  . COPD Father   . Heart disease Brother 81       Heart Disease before age 53  . Heart attack Brother   . Peripheral vascular disease Other   . Hypertension Sister        Home Medications:   Prior to Admission medications   Medication Sig Start Date End Date Taking? Authorizing Provider  ALPRAZolam (XANAX) 0.25 MG tablet Take 1 tablet (0.25 mg total) by mouth daily as needed for anxiety. 07/26/19  Yes Nche, Charlene Brooke, NP  aspirin 81 MG chewable tablet Chew 1 tablet (81 mg total) by mouth daily. 10/26/18  Yes Hennie Duos, MD  cefdinir  (OMNICEF) 300 MG capsule Take 1 capsule (300 mg total) by mouth 2 (two) times daily for 12 days. 08/24/19 09/05/19 Yes Spongberg, Audie Pinto, MD  ciprofloxacin (CIPRO) 250 MG tablet Take 1 tablet (250 mg total) by mouth 2 (two) times daily. 08/28/19  Yes Nche, Charlene Brooke, NP  gabapentin (NEURONTIN) 100 MG capsule Take 1 capsule (100 mg total) by mouth at bedtime. 06/30/19  Yes Nche, Charlene Brooke, NP  levothyroxine (SYNTHROID) 75 MCG tablet Take 1 tablet (75 mcg total) by mouth daily before breakfast. 07/05/19  Yes Nche, Charlene Brooke, NP  metoprolol succinate (TOPROL-XL) 25 MG 24 hr tablet Take 3 tablets (75 mg total) by mouth daily. 06/30/19  Yes Nche, Charlene Brooke, NP  Olopatadine HCl 0.2 % SOLN Place 1 drop into both eyes daily. Patient taking differently: Place 1 drop into both eyes 4 (four) times daily.  10/26/18  Yes Hennie Duos, MD  acetaminophen (TYLENOL) 500 MG tablet Take 1,000 mg by mouth every 8 (eight) hours as needed (joint pain).     [provider]  Multiple Vitamin (MULTIVITAMIN WITH MINERALS) TABS tablet Take 1 tablet by mouth daily. Patient not taking: Reported on 08/22/2019 09/27/18   Kerney Elbe, DO     Allergies:     Allergies  Allergen Reactions  . Infliximab Other (See Comments)    REACTION: "Enlarged intestines and hernia. Also pushed intestines on lower part of lungs." REACTION: "Enlarged intestines and hernia. Also pushed intestines on lower part of lungs."  . Aspirin Nausea And Vomiting    Low Dose is ok Stomach problems  . Carvedilol Other (See Comments)    Fatigue/ ill Fatigue/ ill  . Codeine Nausea And Vomiting    "Deathly Sick"  . Penicillins Rash    Has patient had a PCN reaction causing immediate rash, facial/tongue/throat swelling, SOB or lightheadedness with hypotension: Y Has patient had a PCN reaction causing severe rash involving mucus membranes or skin necrosis: Y Has patient had a PCN reaction that required  hospitalization: N Has patient had a PCN reaction occurring within the last 10 years: N If all of the above answers are "  NO", then may proceed with Cephalosporin use.      Physical Exam:   Vitals  Blood pressure (!) 162/98, pulse 83, temperature 99.4 F (37.4 C), temperature source Oral, resp. rate (!) 22, height 5' (1.524 m), weight 54.4 kg, SpO2 100 %.  1.  General: aoxo3   2. Psychiatric: euthymic  3. Neurologic: cn2-12 intact, reflexes 2+ symmetric, diffuse with no clonus, motor 5/5 in all 4 ext  4. HEENMT:  Anicteric, pupils 1.79mm symmetric, direct, consensual,  Neck: slight  jvd  5. Respiratory : Bibasilar crackles , no wheezes  6. Cardiovascular : rrr s1, s2, 2/6 sem rusb  7. Gastrointestinal:  Abd: soft, nt, nd, +bs  8. Skin:  Ext: no c/c/e,  No rash  9.Musculoskeletal:  Good ROM,     Data Review:    CBC Recent Labs  Lab 09/02/19 1726  WBC 11.4*  HGB 9.9*  HCT 33.0*  PLT 463*  MCV 89.4  MCH 26.8  MCHC 30.0  RDW 15.7*  LYMPHSABS 1.8  MONOABS 0.6  EOSABS 0.2  BASOSABS 0.1   ------------------------------------------------------------------------------------------------------------------  Results for orders placed or performed during the hospital encounter of 09/02/19 (from the past 48 hour(s))  CBC with Differential     Status: Abnormal   Collection Time: 09/02/19  5:26 PM  Result Value Ref Range   WBC 11.4 (H) 4.0 - 10.5 K/uL   RBC 3.69 (L) 3.87 - 5.11 MIL/uL   Hemoglobin 9.9 (L) 12.0 - 15.0 g/dL   HCT 33.0 (L) 36.0 - 46.0 %   MCV 89.4 80.0 - 100.0 fL   MCH 26.8 26.0 - 34.0 pg   MCHC 30.0 30.0 - 36.0 g/dL   RDW 15.7 (H) 11.5 - 15.5 %   Platelets 463 (H) 150 - 400 K/uL   nRBC 0.0 0.0 - 0.2 %   Neutrophils Relative % 75 %   Neutro Abs 8.7 (H) 1.7 - 7.7 K/uL   Lymphocytes Relative 16 %   Lymphs Abs 1.8 0.7 - 4.0 K/uL   Monocytes Relative 5 %   Monocytes Absolute 0.6 0.1 - 1.0 K/uL   Eosinophils Relative 2 %   Eosinophils Absolute  0.2 0.0 - 0.5 K/uL   Basophils Relative 1 %   Basophils Absolute 0.1 0.0 - 0.1 K/uL   Immature Granulocytes 1 %   Abs Immature Granulocytes 0.06 0.00 - 0.07 K/uL    Comment: Performed at [facility], [place] [address], [place], [place] [address]  Troponin I (High Sensitivity)     Status: Abnormal   Collection Time: 09/02/19  5:26 PM  Result Value Ref Range   Troponin I (High Sensitivity) 23 (H) <18 ng/L    Comment: (NOTE) Elevated high sensitivity troponin I (hsTnI) values and significant  changes across serial measurements may suggest ACS but many other  chronic and acute conditions are known to elevate hsTnI results.  Refer to the "Links" section for chest pain algorithms and additional  guidance. Performed at [facility], [place] [address] [place], [place] [address]   Comprehensive metabolic panel     Status: Abnormal   Collection Time: 09/02/19  5:26 PM  Result Value Ref Range   Sodium 137 135 - 145 mmol/L   Potassium 3.3 (L) 3.5 - 5.1 mmol/L   Chloride 102 98 - 111 mmol/L   CO2 26 22 - 32 mmol/L   Glucose, Bld 104 (H) 70 - 99 mg/dL   BUN 9 8 - 23 mg/dL   Creatinine,  Ser 0.83 0.44 - 1.00 mg/dL   Calcium 8.1 (L) 8.9 - 10.3 mg/dL   Total Protein 6.8 6.5 - 8.1 g/dL   Albumin 2.9 (L) 3.5 - 5.0 g/dL   AST 25 15 - 41 U/L   ALT 14 0 - 44 U/L   Alkaline Phosphatase 101 38 - 126 U/L   Total Bilirubin 1.0 0.3 - 1.2 mg/dL   GFR calc non Af Amer >60 >60 mL/min   GFR calc Af Amer >60 >60 mL/min   Anion gap 9 5 - 15    Comment: Performed at [facility], [place] [address] [place], [place] [address]  D-dimer, quantitative (not at [facility])     Status: Abnormal   Collection Time: 09/02/19  5:26 PM  Result Value Ref Range   D-Dimer, Quant 2.38 (H) 0.00 - 0.50 ug/mL-FEU    Comment: (NOTE) At the manufacturer cut-off of 0.50 ug/mL FEU, this assay has been documented to exclude PE with a sensitivity and negative predictive value of 97 to  99%.  At this time, this assay has not been approved by the FDA to exclude DVT/VTE. Results should be correlated with clinical presentation. Performed at [facility], [place] [address], [place], [place] [address]   Brain natriuretic peptide     Status: Abnormal   Collection Time: 09/02/19  5:27 PM  Result Value Ref Range   B Natriuretic Peptide 1,352.1 (H) 0.0 - 100.0 pg/mL    Comment: Performed at [facility], [place] [address], [place], [place] [address]    Chemistries  Recent Labs  Lab 09/02/19 1726  NA 137  K 3.3*  CL 102  CO2 26  GLUCOSE 104*  BUN 9  CREATININE 0.83  CALCIUM 8.1*  AST 25  ALT 14  ALKPHOS 101  BILITOT 1.0   ------------------------------------------------------------------------------------------------------------------  ------------------------------------------------------------------------------------------------------------------ GFR: Estimated Creatinine Clearance: 32.4 mL/min (by C-G formula based on SCr of 0.83 mg/dL). Liver Function Tests: Recent Labs  Lab 09/02/19 1726  AST 25  ALT 14  ALKPHOS 101  BILITOT 1.0  PROT 6.8  ALBUMIN 2.9*   No results for input(s): LIPASE, AMYLASE in the last 168 hours. No results for input(s): AMMONIA in the last 168 hours. Coagulation Profile: No results for input(s): INR, PROTIME in the last 168 hours. Cardiac Enzymes: No results for input(s): CKTOTAL, CKMB, CKMBINDEX, TROPONINI in the last 168 hours. BNP (last 3 results) No results for input(s): PROBNP in the last 8760 hours. HbA1C: No results for input(s): HGBA1C in the last 72 hours. CBG: No results for input(s): GLUCAP in the last 168 hours. Lipid Profile: No results for input(s): CHOL, HDL, LDLCALC, TRIG, CHOLHDL, LDLDIRECT in the last 72 hours. Thyroid Function Tests: No results for input(s): TSH, T4TOTAL, FREET4, T3FREE, THYROIDAB in the last 72 hours. Anemia Panel: No results for input(s): VITAMINB12,  FOLATE, FERRITIN, TIBC, IRON, RETICCTPCT in the last 72 hours.  --------------------------------------------------------------------------------------------------------------- Urine analysis:    Component Value Date/Time   COLORURINE YELLOW 08/21/2019 1800   APPEARANCEUR TURBID (A) 08/21/2019 1800   LABSPEC 1.013 08/21/2019 1800   PHURINE 5.0 08/21/2019 1800   GLUCOSEU NEGATIVE 08/21/2019 1800   HGBUR MODERATE (A) 08/21/2019 1800   BILIRUBINUR NEGATIVE 08/21/2019 1800   KETONESUR NEGATIVE 08/21/2019 1800   PROTEINUR 100 (A) 08/21/2019 1800   UROBILINOGEN 0.2 07/03/2014 0313   NITRITE POSITIVE (A) 08/21/2019 1800   LEUKOCYTESUR LARGE (A) 08/21/2019 1800      Imaging Results:    Dg Chest [facility]  1 View  Result Date: 09/02/2019 CLINICAL DATA:  Shortness of breath for 1 week EXAM: PORTABLE CHEST 1 VIEW COMPARISON:  Radiograph 08/24/2019 FINDINGS: Increasing hazy interstitial opacities throughout the lungs with cephalized, indistinct pulmonary vascularity and some septal thickening in the lung periphery. Hazy opacity obscures both hemidiaphragms suggesting a bilateral pleural effusions. Cardiomegaly is similar to comparison though portions of the cardiac borders are obscured by overlying opacity. The aorta is calcified. No pneumothorax. No acute osseous or soft tissue abnormality. Known hiatal hernia is poorly visualized. IMPRESSION: Features suggesting CHF/volume overload with cardiomegaly, interstitial edema and bilateral effusions. Aortic Atherosclerosis (ICD10-I70.0). Electronically Signed   By: Lovena Le M.D.   On: 09/02/2019 18:07      Assessment & Plan:    Principal Problem:   Acute CHF (congestive heart failure) (HCC) Active Problems:   Aortic stenosis   Hypothyroidism   Atrial fibrillation, chronic   Essential hypertension  Dyspnea secondary to Acute CHF / Aortic stenosis  Acute Diastolic CHF Tele Trop I [id number] x2 Check cardiac echo Lasix 40mg  iv qday Cont Toprol XL 75mg   po qday  H/o Aortic stenosis Check cardiac echo as above Please consult cardiology if severe AS  Pafib Cont Toprol XL as above Cont Aspirin 81mg  po qday  Hypothyroidism Check TSH  Cont Levothyroxine 75 micrograms po qday  Neuropathy Cont Gabapentin 100mg  po qhs  Anxiety Cont Xanax 0.25mg  po qday prn    DVT Prophylaxis-   Lovenox - SCDs   AM Labs Ordered, also please review Full Orders  Family Communication: Admission, patients condition and plan of care including tests being ordered have been discussed with the patient who indicate understanding and agree with the plan and Code Status.  Code Status:   FULL CODE per patient, attempted to notify  son that patient admitted to [facility], but mailbox says, not set up yet.   Admission status: Inpatient: Based on patients clinical presentation and evaluation of above clinical data, I have made determination that patient meets Inpatient criteria at this time.  Pt will require iv lasix for acute chf,  Pt has high risk of clinical deterioration,  Pt will require > 2 nites stay.   Time spent in minutes : 70 minutes   Jani Gravel M.D on 09/02/2019 at 9:52 PM

## 2019-09-02 NOTE — Telephone Encounter (Signed)
Pt. Reports she is having shortness of breath with exertion and is sleeping "propped up." "I don't feeel any better than when I went to the emergency room." Requests visit sooner than Nov. 10. Warm transfer to [facility] in the practice. Answer Assessment - Initial Assessment Questions 1. RESPIRATORY STATUS: "Describe your breathing?" (e.g., wheezing, shortness of breath, unable to speak, severe coughing)      Shortness of breath 2. ONSET: "When did this breathing problem begin?"      2 weeks ago 3. PATTERN "Does the difficult breathing come and go, or has it been constant since it started?"      Comes and goes 4. SEVERITY: "How bad is your breathing?" (e.g., mild, moderate, severe)    - MILD: No SOB at rest, mild SOB with walking, speaks normally in sentences, can lay down, no retractions, pulse < 100.    - MODERATE: SOB at rest, SOB with minimal exertion and prefers to sit, cannot lie down flat, speaks in phrases, mild retractions, audible wheezing, pulse 100-120.    - SEVERE: Very SOB at rest, speaks in single words, struggling to breathe, sitting hunched forward, retractions, pulse > 120      Moderate 5. RECURRENT SYMPTOM: "Have you had difficulty breathing before?" If so, ask: "When was the last time?" and "What happened that time?"      No 6. CARDIAC HISTORY: "Do you have any history of heart disease?" (e.g., heart attack, angina, bypass surgery, angioplasty)      Yes 7. LUNG HISTORY: "Do you have any history of lung disease?"  (e.g., pulmonary embolus, asthma, emphysema)     No 8. CAUSE: "What do you think is causing the breathing problem?"      Unsure 9. OTHER SYMPTOMS: "Do you have any other symptoms? (e.g., dizziness, runny nose, cough, chest pain, fever)     No 10. PREGNANCY: "Is there any chance you are pregnant?" "When was your last menstrual period?"       No 11. TRAVEL: "Have you traveled out of the country in the last month?" (e.g., travel history, exposures)        No  Protocols used: BREATHING DIFFICULTY-A-AH

## 2019-09-02 NOTE — Telephone Encounter (Signed)
I called pt to schedule an appt and she declined, she said she will handle it on her own and hung up

## 2019-09-02 NOTE — ED Notes (Signed)
Patient refused CT Angio Study. MD Dykstra made aware.

## 2019-09-02 NOTE — Telephone Encounter (Signed)
Pt has called back again and she says she wants a call back today and seems  Upset that no one has called her. [phone]

## 2019-09-02 NOTE — Telephone Encounter (Signed)
[name] is aware.

## 2019-09-02 NOTE — Telephone Encounter (Signed)
[name] the son called and said thanks for the hepl his mom is now at [facility] [employer] [facility].

## 2019-09-03 ENCOUNTER — Inpatient Hospital Stay (HOSPITAL_COMMUNITY): Payer: Medicare Other

## 2019-09-03 DIAGNOSIS — J9601 Acute respiratory failure with hypoxia: Secondary | ICD-10-CM

## 2019-09-03 DIAGNOSIS — I361 Nonrheumatic tricuspid (valve) insufficiency: Secondary | ICD-10-CM

## 2019-09-03 DIAGNOSIS — I10 Essential (primary) hypertension: Secondary | ICD-10-CM

## 2019-09-03 LAB — COMPREHENSIVE METABOLIC PANEL
ALT: 13 U/L (ref 0–44)
AST: 20 U/L (ref 15–41)
Albumin: 2.7 g/dL — ABNORMAL LOW (ref 3.5–5.0)
Alkaline Phosphatase: 91 U/L (ref 38–126)
Anion gap: 11 (ref 5–15)
BUN: 9 mg/dL (ref 8–23)
CO2: 28 mmol/L (ref 22–32)
Calcium: 7.9 mg/dL — ABNORMAL LOW (ref 8.9–10.3)
Chloride: 97 mmol/L — ABNORMAL LOW (ref 98–111)
Creatinine, Ser: 0.95 mg/dL (ref 0.44–1.00)
GFR calc Af Amer: >60 mL/min (ref >60)
GFR calc non Af Amer: 53 mL/min — ABNORMAL LOW (ref >60)
Glucose, Bld: 116 mg/dL — ABNORMAL HIGH (ref 70–99)
Potassium: 2.6 mmol/L — CL (ref 3.5–5.1)
Sodium: 136 mmol/L (ref 135–145)
Total Bilirubin: 0.8 mg/dL (ref 0.3–1.2)
Total Protein: 6.4 g/dL — ABNORMAL LOW (ref 6.5–8.1)

## 2019-09-03 LAB — MAGNESIUM: Magnesium: 1.3 mg/dL — ABNORMAL LOW (ref 1.7–2.4)

## 2019-09-03 LAB — CBC
HCT: 30.8 % — ABNORMAL LOW (ref 36.0–46.0)
Hemoglobin: 9.5 g/dL — ABNORMAL LOW (ref 12.0–15.0)
MCH: 27.2 pg (ref 26.0–34.0)
MCHC: 30.8 g/dL (ref 30.0–36.0)
MCV: 88.3 fL (ref 80.0–100.0)
Platelets: 431 10*3/uL — ABNORMAL HIGH (ref 150–400)
RBC: 3.49 MIL/uL — ABNORMAL LOW (ref 3.87–5.11)
RDW: 15.4 % (ref 11.5–15.5)
WBC: 11.8 10*3/uL — ABNORMAL HIGH (ref 4.0–10.5)
nRBC: 0 % (ref 0.0–0.2)

## 2019-09-03 LAB — ECHOCARDIOGRAM COMPLETE
Height: 60 in
Weight: 1904.77 oz

## 2019-09-03 LAB — SARS CORONAVIRUS 2 (TAT 6-24 HRS): SARS Coronavirus 2: NEGATIVE

## 2019-09-03 LAB — TROPONIN I (HIGH SENSITIVITY): Troponin I (High Sensitivity): 26 ng/L — ABNORMAL HIGH (ref <18)

## 2019-09-03 MED ORDER — POTASSIUM CHLORIDE CRYS ER 20 MEQ PO TBCR
40.0000 meq | EXTENDED_RELEASE_TABLET | Freq: Once | ORAL | Status: AC
  Administered 2019-09-03: 40 meq via ORAL
  Filled 2019-09-03: qty 2

## 2019-09-03 MED ORDER — POTASSIUM CHLORIDE CRYS ER 20 MEQ PO TBCR
40.0000 meq | EXTENDED_RELEASE_TABLET | Freq: Two times a day (BID) | ORAL | Status: DC
  Administered 2019-09-03 – 2019-09-04 (×4): 40 meq via ORAL
  Filled 2019-09-03 (×4): qty 2

## 2019-09-03 MED ORDER — POTASSIUM CHLORIDE 10 MEQ/100ML IV SOLN
10.0000 meq | INTRAVENOUS | Status: DC
  Administered 2019-09-03: 10 meq via INTRAVENOUS
  Filled 2019-09-03: qty 100

## 2019-09-03 MED ORDER — METOPROLOL SUCCINATE ER 50 MG PO TB24
75.0000 mg | ORAL_TABLET | Freq: Every day | ORAL | Status: DC
  Administered 2019-09-03 – 2019-09-05 (×3): 75 mg via ORAL
  Filled 2019-09-03 (×3): qty 1

## 2019-09-03 MED ORDER — LEVOTHYROXINE SODIUM 75 MCG PO TABS
75.0000 ug | ORAL_TABLET | Freq: Every day | ORAL | Status: DC
  Administered 2019-09-03 – 2019-09-05 (×3): 75 ug via ORAL
  Filled 2019-09-03 (×3): qty 1

## 2019-09-03 MED ORDER — ALPRAZOLAM 0.25 MG PO TABS
0.2500 mg | ORAL_TABLET | Freq: Every day | ORAL | Status: DC | PRN
  Administered 2019-09-03 – 2019-09-04 (×2): 0.25 mg via ORAL
  Filled 2019-09-03 (×2): qty 1

## 2019-09-03 MED ORDER — CHLORHEXIDINE GLUCONATE CLOTH 2 % EX PADS
6.0000 | MEDICATED_PAD | Freq: Every day | CUTANEOUS | Status: DC
  Administered 2019-09-05: 11:00:00 6 via TOPICAL

## 2019-09-03 MED ORDER — ASPIRIN 81 MG PO CHEW
81.0000 mg | CHEWABLE_TABLET | Freq: Every day | ORAL | Status: DC
  Administered 2019-09-03 – 2019-09-05 (×3): 81 mg via ORAL
  Filled 2019-09-03 (×3): qty 1

## 2019-09-03 MED ORDER — MAGNESIUM SULFATE 50 % IJ SOLN
3.0000 g | Freq: Once | INTRAVENOUS | Status: AC
  Administered 2019-09-03: 3 g via INTRAVENOUS
  Filled 2019-09-03: qty 6

## 2019-09-03 MED ORDER — OLOPATADINE HCL 0.1 % OP SOLN
1.0000 [drp] | Freq: Four times a day (QID) | OPHTHALMIC | Status: DC
  Administered 2019-09-03 – 2019-09-05 (×9): 1 [drp] via OPHTHALMIC
  Filled 2019-09-03: qty 5

## 2019-09-03 MED ORDER — GABAPENTIN 100 MG PO CAPS
100.0000 mg | ORAL_CAPSULE | Freq: Every day | ORAL | Status: DC
  Administered 2019-09-03 – 2019-09-04 (×2): 100 mg via ORAL
  Filled 2019-09-03 (×2): qty 1

## 2019-09-03 NOTE — Progress Notes (Signed)
  Echocardiogram 2D Echocardiogram has been performed.  Darlina Sicilian M 09/03/2019, 9:09 AM

## 2019-09-03 NOTE — Progress Notes (Signed)
PROGRESS NOTE    [name]  [id number] DOB: Apr 13, 1927 DOA: 09/02/2019 PCP: [name], NP  Brief Narrative:  HPI per Dr. Jani Gravel on 09/02/2019 [name]  is a 85 y.o. female, w hypothyroidism, iron deficiency anemia, RA, hypertension, hyperlipidemia, Pafib, Aortic stenosis, was recently admitted on 08/21/2019, E. Coli sepsis, presents with dyspnea since discharge. Pt has had slight dry cough, pt denies fever, chills, cp, palp, n/v, abd, pain, dysuria, hematuria.  Pt denies orthopnea, pnd, weight gain.   In ED,  T 99.1  P 88 R 21, Bp 168/105  Pox 98% on RA Wt 54.4 kg  CXR IMPRESSION: Features suggesting CHF/volume overload with cardiomegaly, interstitial edema and bilateral effusions.  Aortic Atherosclerosis (ICD10-I70.0).  Wbc 11.4, Hgb 9.9 Plt 463  Na 137, K 3.3, Bun 9, Creatinine 0.83 Ast 25, Alt 14 D dimer 2.38 Trop 23 BNP 1,352.1  Ekg not in computer, will reorder.   Pt wil be admitted for acute CHF  **Interim History Continues to be a little bit dyspneic but not as much as yesterday.  Refusing CTA and VQ scan.  Continue diuresis.  No nausea or vomiting.  No other concerns reported at this time and asking for home prescription for oxygen  Assessment & Plan:   Principal Problem:   Acute CHF (congestive heart failure) ([place]) Active Problems:   Aortic stenosis   Hypothyroidism   Atrial fibrillation, chronic   Essential hypertension  Acute Respiratory Failure with Hypoxia in the setting of acute Systolic CHF\ -Continue supplemental oxygen via nasal cannula and wean O2 as tolerated -continuous pulse oximetry and maintain O2 saturation greater 90% -Repeat CXR in AM -D Dimer was elevated but patient refused CTA Chest PE and V/Q Scan  -We will need an ambulatory O2 screen prior to discharge  Acute Systolic and Acute on Chronic Diastolic CHF -C/w Tele -BNP was elevated at 1352 -high-sensitivity troponins are relatively flat and ranged  from 23 and trended up to 26 -Echocardiogram shows new systolic dysfunction with an EF of 35 to [id number] and diastolic dysfunction could not be assessed due to atrial fibrillation -Continue with IV Lasix 40 mg p.o. daily -Cont Toprol XL 75mg  po qday -Strict I's/O's and Daily Weights -Repeat CXR in AM  -We will consult cardiology for further evaluation recommendations given her new systolic dysfunction  H/o Aortic Stenosis -ECHOCardiogram showed no actual stenosis but showed "The aortic valve is tricuspid. . There is Severely thickening and Severe calcifcation of the aortic valve. Aortic valve regurgitation is trivial. The aortic valve is structurally normal, with no evidence of sclerosis or stenosis. There is  Severely thickening of the aortic valve. Severe calcifcation. Aortic valve mean gradient measures 12.8 mmHg. Aortic valve peak gradient measures 21.8 mmHg. Aortic valve area, by VTI measures 0.42 cm." -Will Consult Cardiology for further assistance   Atrial Fibrillation; Paroxysmal  -Continue with Telemetry -Currently in Atrial Fibrillation -Cont Toprol XL as above -Cont Aspirin 81mg  po qday  Hypothyroidism -Check TSH in AM  -Cont Levothyroxine 75 micrograms po qday  Neuropathy -Continue Gabapentin 100mg  po qhs  Anxiety -Continue Xanax 0.25mg  po qday prn   Hypokalemia -In the setting of diuresis The patient potassium is now 2.6 this morning-replete with p.o. potassium chloride 40 mEQ BID x2 doses -Patient refused her IV potassium supplementation -Continue to monitor and replete as necessary -Repeat CMP in a.m.  Hypomagnesemia -Patient's magnesium level this morning was 1.3 -Replete with IV mag sulfate 3 g -Continue to monitor output as necessary -  Repeat magnesium level in the a.m.  Normocytic anemia The patient's hemoglobin/hematocrit went from 9.9/33.0 and is now 9.5/30.8 next-check anemia panel in the a.m. -Continue to monitor for signs and symptoms of bleeding;  currently no overt bleeding -Repeat CBC in a.m.  Leukocytosis -Mild and elevated at 11.9 -likely reactive in the setting of CHF -Continue to monitor and trend and repeat CBC in a.m.  Thrombocytosis  -platelet count on admission was 463,000 is now trending down to 431,000 -Continue to monitor and trend  DVT prophylaxis: Enoxaparin 40 mg sq q24h Code Status: FULL CODE  Family Communication: No family present at bedside  Disposition Plan: Home when Medically Stable   Consultants:   Will Consult Cardiology in AM    Procedures:  ECHOCARDIOGRAM IMPRESSIONS    1. Left ventricular ejection fraction, by visual estimation, is 35-40%. There is mildly increased left ventricular hypertrophy of the basal septum.  2. There is akinesis of the apical septal, apical, apical inferior , mid anteroseptal and apical anterior walls.  3. Left ventricular diastolic function could not be evaluated due to underlying atrial fibrillation.  4. Elevated left ventricular end-diastolic pressure.  5. Global right ventricle has mildly reduced systolic function.The right ventricular size is normal. No increase in right ventricular wall thickness.  6. Left atrial size was moderately dilated.  7. Right atrial size was moderately dilated.  8. The mitral valve is normal in structure. Trace mitral valve regurgitation. No evidence of mitral stenosis.  9. The tricuspid valve is normal in structure. Tricuspid valve regurgitation is mild. 10. The aortic valve is tricuspid. There is Severely thickening of the aortic valve. There is Severe calcifcation of the aortic valve. Aortic valve regurgitation is trivial. No evidence of aortic valve sclerosis or stenosis. The peak velocity and mean  gradient are c/w mild AS but these are likely underestimated in the setting of severe LV dysfunction. The AVA is calculated at 0.42 cm and dimensionless index 0.27. Visually valve appears at least moderate to severly stenotic. 11. The  pulmonic valve was normal in structure. Pulmonic valve regurgitation is trivial. 12. Mildly elevated pulmonary artery systolic pressure. 13. The inferior vena cava is normal in size with greater than 50% respiratory variability, suggesting right atrial pressure of 3 mmHg. 14. Trivial pericardial effusion is present. 15. The pericardial effusion is localized near the right atrium. 16. Cannot rule out apical thrombus. Recommend limited study with definity contrast.  FINDINGS  Left Ventricle: Left ventricular ejection fraction, by visual estimation, is 35 to 40%. There is mildly increased left ventricular hypertrophy. The left ventricular diastology could not be evaluated due to atrial fibrillation. Left ventricular diastolic  function could not be evaluated. Elevated left ventricular end-diastolic pressure. There is akinesis of the apical septal, apical, apical inferior , mid anteroseptal and apical anterior walls.  Right Ventricle: The right ventricular size is normal. No increase in right ventricular wall thickness. Global RV systolic function is has mildly reduced systolic function. The tricuspid regurgitant velocity is 2.64 m/s, and with an assumed right atrial  pressure of 3 mmHg, the estimated right ventricular systolic pressure is mildly elevated at 30.9 mmHg.  Left Atrium: Left atrial size was moderately dilated.  Right Atrium: Right atrial size was moderately dilated  Pericardium: Trivial pericardial effusion is present. The pericardial effusion is localized near the right atrium.  Mitral Valve: The mitral valve is normal in structure. No evidence of mitral valve stenosis by observation. Trace mitral valve regurgitation.  Tricuspid Valve: The tricuspid valve is  normal in structure. Tricuspid valve regurgitation is mild.  Aortic Valve: The aortic valve is tricuspid. . There is Severely thickening and Severe calcifcation of the aortic valve. Aortic valve regurgitation is trivial.  The aortic valve is structurally normal, with no evidence of sclerosis or stenosis. There is  Severely thickening of the aortic valve. Severe calcifcation. Aortic valve mean gradient measures 12.8 mmHg. Aortic valve peak gradient measures 21.8 mmHg. Aortic valve area, by VTI measures 0.42 cm.  Pulmonic Valve: The pulmonic valve was normal in structure. Pulmonic valve regurgitation is trivial.  Aorta: The aortic root, ascending aorta and aortic arch are all structurally normal, with no evidence of dilitation or obstruction.  Venous: The inferior vena cava is normal in size with greater than 50% respiratory variability, suggesting right atrial pressure of 3 mmHg.  IAS/Shunts: No atrial level shunt detected by color flow Doppler. No ventricular septal defect is seen or detected. There is no evidence of an atrial septal defect.     LEFT VENTRICLE PLAX 2D LVIDd:         3.30 cm       Diastology LVIDs:         2.00 cm       LV e' lateral:   7.56 cm/s LV PW:         1.00 cm       LV E/e' lateral: 14.8 LV IVS:        1.20 cm       LV e' medial:    5.44 cm/s LVOT diam:     1.40 cm       LV E/e' medial:  20.6 LV SV:         31 ml LV SV Index:   20.65 LVOT Area:     1.54 cm   LV Volumes (MOD) LV area d, A2C:    25.20 cm LV area d, A4C:    22.80 cm LV area s, A2C:    20.60 cm LV area s, A4C:    19.40 cm LV major d, A2C:   6.78 cm LV major d, A4C:   5.91 cm LV major s, A2C:   6.27 cm LV major s, A4C:   6.01 cm LV vol d, MOD A2C: 77.0 ml LV vol d, MOD A4C: 72.0 ml LV vol s, MOD A2C: 54.2 ml LV vol s, MOD A4C: 50.2 ml LV SV MOD A2C:     22.8 ml LV SV MOD A4C:     72.0 ml LV SV MOD BP:      26.6 ml  RIGHT VENTRICLE RV S prime:     7.18 cm/s TAPSE (M-mode): 1.4 cm  LEFT ATRIUM             Index LA diam:        4.50 cm 3.01 cm/m LA Vol (A2C):   54.7 ml 36.53 ml/m LA Vol (A4C):   73.3 ml 48.96 ml/m LA Biplane Vol: 65.6 ml 43.81 ml/m  AORTIC VALVE AV Area (Vmax):     0.44 cm AV Area (Vmean):   0.43 cm AV Area (VTI):     0.42 cm AV Vmax:           233.40 cm/s AV Vmean:          168.000 cm/s AV VTI:            0.469 m AV Peak Grad:      21.8 mmHg AV Mean Grad:      12.8  mmHg LVOT Vmax:         67.20 cm/s LVOT Vmean:        46.600 cm/s LVOT VTI:          0.128 m LVOT/AV VTI ratio: 0.27   AORTA Ao Root diam: 2.20 cm Ao Asc diam:  3.25 cm  MITRAL VALVE                        TRICUSPID VALVE MV Area (PHT): 5.14 cm             TR Peak grad:   27.9 mmHg MV PHT:        42.78 msec           TR Vmax:        264.00 cm/s MV Decel Time: 148 msec MV E velocity: 112.00 cm/s 103 cm/s SHUNTS                                     Systemic VTI:  0.13 m                                     Systemic Diam: 1.40 cm   Antimicrobials: Anti-infectives (From admission, onward)   None     Subjective: Seen and examined at bedside and patient was still dyspneic but not as much as yesterday.  No chest pain currently but was asking for oxygen prescription.  No lightheadedness or dizziness.  No other concerns reported at this time.  Objective: Vitals:   09/02/19 2000 09/02/19 2132 09/03/19 0602 09/03/19 1306  BP: (!) 158/92 (!) 162/98 114/74 134/80  Pulse: 82 83 78 83  Resp: (!) 24 (!) 22 16 16   Temp:  99.4 F (37.4 C) 98.1 F (36.7 C) 98.6 F (37 C)  TempSrc:  Oral Oral Oral  SpO2: 100% 100% 97% 100%  Weight:   54 kg   Height:   5' (1.524 m)     Intake/Output Summary (Last 24 hours) at 09/03/2019 1926 Last data filed at 09/03/2019 [id number] Gross per 24 hour  Intake --  Output 1800 ml  Net -1800 ml   Filed Weights   09/02/19 1655 09/03/19 0602  Weight: 54.4 kg 54 kg   Examination: Physical Exam:  Constitutional: Thin elderly Caucasian female in NAD and appears calm  Eyes: Lids and conjunctivae normal, sclerae anicteric  ENMT: External Ears, Nose appear normal. Grossly normal hearing. Mucous membranes are moist.  Neck: Appears normal, supple, no  cervical masses, normal ROM, no appreciable thyromegaly; no JVD Respiratory: Diminished to auscultation bilaterally, no wheezing, rales, rhonchi or crackles. Normal respiratory effort and patient is not tachypenic. No accessory muscle use.  Cardiovascular: Irregularly Irregular, no murmurs / rubs / gallops. S1 and S2 auscultated. 1+ LE edema  Abdomen: Soft, non-tender, Distended. No masses palpated. Bowel sounds positive x4.  GU: Deferred. Musculoskeletal: No clubbing / cyanosis of digits/nails. No joint deformity upper and lower extremities. Skin: No rashes, lesions, ulcers on a limited skin evaluation. No induration; Warm and dry.  Neurologic: CN 2-12 grossly intact with no focal deficits.Romberg sign and cerebellar reflexes not assessed.  Psychiatric: Normal judgment and insight. Alert and oriented x 3. Anxious mood and appropriate affect.   Data Reviewed: I have personally reviewed following labs and imaging studies  CBC: Recent Labs  Lab 09/02/19 1726 09/03/19 0437  WBC 11.4* 11.8*  NEUTROABS 8.7*  --   HGB 9.9* 9.5*  HCT 33.0* 30.8*  MCV 89.4 88.3  PLT 463* [id number]*   Basic Metabolic Panel: Recent Labs  Lab 09/02/19 1726 09/03/19 0437  NA 137 136  K 3.3* 2.6*  CL 102 97*  CO2 26 28  GLUCOSE 104* 116*  BUN 9 9  CREATININE 0.83 0.95  CALCIUM 8.1* 7.9*  MG  --  1.3*   GFR: Estimated Creatinine Clearance: 28.3 mL/min (by C-G formula based on SCr of 0.95 mg/dL). Liver Function Tests: Recent Labs  Lab 09/02/19 1726 09/03/19 0437  AST 25 20  ALT 14 13  ALKPHOS 101 91  BILITOT 1.0 0.8  PROT 6.8 6.4*  ALBUMIN 2.9* 2.7*   No results for input(s): LIPASE, AMYLASE in the last 168 hours. No results for input(s): AMMONIA in the last 168 hours. Coagulation Profile: No results for input(s): INR, PROTIME in the last 168 hours. Cardiac Enzymes: No results for input(s): CKTOTAL, CKMB, CKMBINDEX, TROPONINI in the last 168 hours. BNP (last 3 results) No results for input(s):  PROBNP in the last 8760 hours. HbA1C: No results for input(s): HGBA1C in the last 72 hours. CBG: No results for input(s): GLUCAP in the last 168 hours. Lipid Profile: No results for input(s): CHOL, HDL, LDLCALC, TRIG, CHOLHDL, LDLDIRECT in the last 72 hours. Thyroid Function Tests: No results for input(s): TSH, T4TOTAL, FREET4, T3FREE, THYROIDAB in the last 72 hours. Anemia Panel: No results for input(s): VITAMINB12, FOLATE, FERRITIN, TIBC, IRON, RETICCTPCT in the last 72 hours. Sepsis Labs: No results for input(s): PROCALCITON, LATICACIDVEN in the last 168 hours.  Recent Results (from the past 240 hour(s))  SARS CORONAVIRUS 2 (TAT 6-24 HRS) Nasopharyngeal Nasopharyngeal Swab     Status: None   Collection Time: 09/02/19  6:59 PM   Specimen: Nasopharyngeal Swab  Result Value Ref Range Status   SARS Coronavirus 2 NEGATIVE NEGATIVE Final    Comment: (NOTE) SARS-CoV-2 target nucleic acids are NOT DETECTED. The SARS-CoV-2 RNA is generally detectable in upper and lower respiratory specimens during the acute phase of infection. Negative results do not preclude SARS-CoV-2 infection, do not rule out co-infections with other pathogens, and should not be used as the sole basis for treatment or other patient management decisions. Negative results must be combined with clinical observations, patient history, and epidemiological information. The expected result is Negative. Fact Sheet for Patients: SugarRoll.be Fact Sheet for Healthcare Providers: https://www.woods-mathews.com/ This test is not yet approved or cleared by the [place] FDA and  has been authorized for detection and/or diagnosis of SARS-CoV-2 by FDA under an Emergency Use Authorization (EUA). This EUA will remain  in effect (meaning this test can be used) for the duration of the COVID-19 declaration under Section 56 4(b)(1) of the Act, 21 U.S.C. section 360bbb-3(b)(1), unless the  authorization is terminated or revoked sooner. Performed at [facility], [place] [address], [place], [place] [address]     Radiology Studies: Dg Chest Port 1 View  Result Date: 09/02/2019 CLINICAL DATA:  Shortness of breath for 1 week EXAM: PORTABLE CHEST 1 VIEW COMPARISON:  Radiograph 08/24/2019 FINDINGS: Increasing hazy interstitial opacities throughout the lungs with cephalized, indistinct pulmonary vascularity and some septal thickening in the lung periphery. Hazy opacity obscures both hemidiaphragms suggesting a bilateral pleural effusions. Cardiomegaly is similar to comparison though portions of the cardiac borders are obscured by overlying opacity. The aorta is calcified. No pneumothorax. No acute  osseous or soft tissue abnormality. Known hiatal hernia is poorly visualized. IMPRESSION: Features suggesting CHF/volume overload with cardiomegaly, interstitial edema and bilateral effusions. Aortic Atherosclerosis (ICD10-I70.0). Electronically Signed   By: Lovena Le M.D.   On: 09/02/2019 18:07    Scheduled Meds:  aspirin  81 mg Oral Daily   Chlorhexidine Gluconate Cloth  6 each Topical Daily   enoxaparin (LOVENOX) injection  40 mg Subcutaneous Q24H   furosemide  40 mg Intravenous Daily   gabapentin  100 mg Oral QHS   levothyroxine  75 mcg Oral Q0600   metoprolol succinate  75 mg Oral Daily   olopatadine  1 drop Both Eyes QID   potassium chloride  40 mEq Oral BID   sodium chloride flush  3 mL Intravenous Q12H   Continuous Infusions:  sodium chloride 250 mL (09/03/19 0613)    LOS: 1 day   Kerney Elbe, DO [facility] PAGER is on AMION  If 7PM-7AM, please contact night-coverage [email or web address] Password [facility] 09/03/2019, 7:26 PM

## 2019-09-03 NOTE — TOC Initial Note (Signed)
Transition of Care [facility]) - Initial/Assessment Note    Patient Details  Name: [name] MRN: [id number] Date of Birth: Nov 19, 1926  Transition of [facility]) CM/SW Contact:    Dessa Phi, RN Phone Number: 09/03/2019, 2:54 PM  Clinical Narrative: Patient plans t d/c home, has support.                  Expected Discharge Plan: Home/Self Care     Patient Goals and CMS Choice Patient states their goals for this hospitalization and ongoing recovery are:: go home      Expected Discharge Plan and Services Expected Discharge Plan: Home/Self Care   Discharge Planning Services: CM Consult   Living arrangements for the past 2 months: Apartment                                      Prior Living Arrangements/Services Living arrangements for the past 2 months: Apartment Lives with:: Adult Children Patient language and need for interpreter reviewed:: Yes Do you feel safe going back to the place where you live?: Yes      Need for Family Participation in Patient Care: No (Comment) Care giver support system in place?: Yes (comment)   Criminal Activity/Legal Involvement Pertinent to Current Situation/Hospitalization: No - Comment as needed  Activities of Daily Living Home Assistive Devices/Equipment: Walker (specify type) ADL Screening (condition at time of admission) Patient's cognitive ability adequate to safely complete daily activities?: Yes Is the patient deaf or have difficulty hearing?: No Does the patient have difficulty seeing, even when wearing glasses/contacts?: No Does the patient have difficulty concentrating, remembering, or making decisions?: No Patient able to express need for assistance with ADLs?: Yes Does the patient have difficulty dressing or bathing?: No Independently performs ADLs?: Yes (appropriate for developmental age) Does the patient have difficulty walking or climbing stairs?: Yes Weakness of Legs: Both Weakness of Arms/Hands:  Both  Permission Sought/Granted Permission sought to share information with : Case Manager Permission granted to share information with : Yes, Verbal Permission Granted  Share Information with NAMEJuliann Pulse [facility] [id number]           Emotional Assessment Appearance:: Appears stated age Attitude/Demeanor/Rapport: Gracious Affect (typically observed): Accepting Orientation: : Oriented to Self, Oriented to Place, Oriented to  Time, Oriented to Situation Alcohol / Substance Use: Not Applicable Psych Involvement: No (comment)  Admission diagnosis:  SOB (shortness of breath) [R06.02] Dyspnea on exertion [R06.00] Elevated brain natriuretic peptide (BNP) level [R79.89] Acute heart failure, unspecified heart failure type ([place]) [I50.9] Respiratory failure, unspecified chronicity, unspecified whether with hypoxia or hypercapnia (HCC) [J96.90] Hypervolemia, unspecified hypervolemia type [E87.70] Patient Active Problem List   Diagnosis Date Noted  . Acute CHF (congestive heart failure) ([place]) 09/02/2019  . UTI (urinary tract infection) 08/21/2019  . Colovesical fistula 05/28/2019  . Polyneuropathy 11/06/2018  . Goals of care, counseling/discussion   . Hydronephrosis 10/13/2018  . Ovarian mass 10/13/2018  . C. difficile colitis 10/13/2018  . Clostridium difficile colitis 09/24/2018  . Hypokalemia   . Hypomagnesemia   . Left ovarian cyst   . Acute diverticulitis 09/07/2018  . Atrial fibrillation with rapid ventricular response ([place]) 09/06/2018  . Leucocytosis 09/06/2018  . A-fib ([place]) 09/06/2018  . Malnutrition of moderate degree 07/26/2018  . Perforation of sigmoid colon due to diverticulitis 07/25/2018  . Thrombocytosis ([place]) 07/25/2018  . Sepsis ([place]) 07/24/2018  . Generalized abdominal pain 02/06/2018  .  Paresthesia of both lower extremities 02/06/2018  . CKD (chronic kidney disease) stage 2, GFR 60-89 ml/min 01/07/2018  . Normocytic normochromic anemia 01/07/2018  .  Hyperglycemia 01/07/2018  . Iron deficiency 09/19/2017  . Pyoderma gangrenosum 01/04/2017  . Primary osteoarthritis of both hands 01/04/2017  . Primary osteoarthritis of both feet 01/04/2017  . Primary osteoarthritis of both knees 01/04/2017  . Degenerative joint disease involving multiple joints 01/04/2017  . Neuropathic pain 09/06/2015  . Erythema nodosum 09/21/2014  . Rheumatoid arthritis ([place]) 09/21/2014  . Essential hypertension 09/02/2014  . Rectal bleeding 05/15/2014  . Atrial fibrillation, chronic 05/11/2014  . HLD (hyperlipidemia) 04/02/2014  . Hypothyroidism 04/02/2014  . PAC (premature atrial contraction) 01/23/2014  . Atrial tachycardia, paroxysmal ([place]) 01/23/2014  . Near syncope 01/22/2014  . Varicose veins of lower extremities with other complications [id number]  . Localized edema 06/21/2012  . Venous insufficiency of both lower extremities 04/19/2012  . Leg edema 01/17/2012  . Anxiety 11/05/2011  . Aortic stenosis 03/22/2011   PCP:  [name], NP Pharmacy:   [place], [place] [address] Phone: [phone] Fax: [phone]     Social Determinants of Health (SDOH) Interventions    Readmission Risk Interventions No flowsheet data found.

## 2019-09-03 NOTE — Progress Notes (Signed)
Pt refusing NM Pulmonary Perf and vent. Pt educated about the reason for the test. Pt verbalized understanding. MD informed.

## 2019-09-03 NOTE — Evaluation (Signed)
Physical Therapy Evaluation Patient Details Name: [name] MRN: [id number] DOB: 08/01/1927 Today's Date: 09/03/2019   History of Present Illness  85 yo female admitted with CHF. Hx of hypothyroidism, anemia, RA, CKD, A fib, aortic stenosis, neuropathy  Clinical Impression  On eval, pt was Min guard assist for mobility. She walked ~150 feet while holding on to the IV pole for support. She declines RW use. O2 levels: 97% on RA at rest, 95% on RA during ambulation. Dyspnea 2/4 with activity. Discussed d/c plan-pt plans to return home with son assisting as needed. She declines HHPT f/u. Will follow during hospital stay.     Follow Up Recommendations No PT follow up(pt declines PT f/u)    Equipment Recommendations  None recommended by PT    Recommendations for Other Services       Precautions / Restrictions Precautions Precautions: Fall Restrictions Weight Bearing Restrictions: No      Mobility  Bed Mobility Overal bed mobility: Modified Independent                Transfers Overall transfer level: Needs assistance   Transfers: Sit to/from Stand Sit to Stand: Min guard         General transfer comment: Pt used IV pole to pull up on.  Ambulation/Gait Ambulation/Gait assistance: Min guard Gait Distance (Feet): 150 Feet Assistive device: IV Pole Gait Pattern/deviations: Step-through pattern;Decreased stride length     General Gait Details: close guard for safety. O2 95% on RA  Stairs            Wheelchair Mobility    Modified Rankin (Stroke Patients Only)       Balance Overall balance assessment: Needs assistance         Standing balance support: Bilateral upper extremity supported Standing balance-Leahy Scale: Poor                               Pertinent Vitals/Pain Pain Assessment: 0-10    Home Living Family/patient expects to be discharged to:: Private residence Living Arrangements: Children Available Help at  Discharge: Family;Available 24 hours/day Type of Home: House Home Access: Level entry     Home Layout: One level Home Equipment: Walker - 2 wheels;Shower seat - built in      Prior Production designer, theatre/television/film / Transfers Assistance Needed: walked community distances holding onto son or cart  ADL's / [employer] Assistance Needed: independent with bath, dress, feed; son does cook, clean, laundry, grocery shopping  Comments: no longer drives     Hand Dominance        Extremity/Trunk Assessment   Upper Extremity Assessment Upper Extremity Assessment: Generalized weakness    Lower Extremity Assessment Lower Extremity Assessment: Generalized weakness    Cervical / Trunk Assessment Cervical / Trunk Assessment: Kyphotic  Communication   Communication: No difficulties  Cognition Arousal/Alertness: Awake/alert Behavior During Therapy: WFL for tasks assessed/performed Overall Cognitive Status: Within Functional Limits for tasks assessed                                        General Comments      Exercises     Assessment/Plan    PT Assessment Patient needs continued PT services  PT Problem List Decreased strength;Decreased mobility;Decreased activity tolerance;Decreased balance;Pain;Decreased knowledge of use of DME  PT Treatment Interventions DME instruction;Therapeutic activities;Gait training;Therapeutic exercise;Patient/family education;Balance training;Neuromuscular re-education    PT Goals (Current goals can be found in the Care Plan section)  Acute Rehab PT Goals Patient Stated Goal: Go home with help from son only. PT Goal Formulation: With patient Time For Goal Achievement: 09/17/19 Potential to Achieve Goals: Fair    Frequency Min 3X/week   Barriers to discharge        Co-evaluation               AM-PAC PT "6 Clicks" Mobility  Outcome Measure Help needed turning from your back to your side while in a flat bed without using  bedrails?: A Little Help needed moving from lying on your back to sitting on the side of a flat bed without using bedrails?: A Little Help needed moving to and from a bed to a chair (including a wheelchair)?: A Little Help needed standing up from a chair using your arms (e.g., wheelchair or bedside chair)?: A Little Help needed to walk in hospital room?: A Little Help needed climbing 3-5 steps with a railing? : A Little 6 Click Score: 18    End of Session   Activity Tolerance: Patient tolerated treatment well Patient left: in bed;with call bell/phone within reach;with bed alarm set   PT Visit Diagnosis: Unsteadiness on feet (R26.81);Other abnormalities of gait and mobility (R26.89);Muscle weakness (generalized) (M62.81)    Time: [id number] PT Time Calculation (min) (ACUTE ONLY): 18 min   Charges:   PT Evaluation $PT Eval Moderate Complexity: [place], PT Acute Rehabilitation Services Pager: [phone] Office: [phone]

## 2019-09-04 ENCOUNTER — Encounter (HOSPITAL_COMMUNITY): Payer: Self-pay | Admitting: Cardiology

## 2019-09-04 DIAGNOSIS — I482 Chronic atrial fibrillation, unspecified: Secondary | ICD-10-CM

## 2019-09-04 DIAGNOSIS — I5041 Acute combined systolic (congestive) and diastolic (congestive) heart failure: Secondary | ICD-10-CM

## 2019-09-04 DIAGNOSIS — I350 Nonrheumatic aortic (valve) stenosis: Secondary | ICD-10-CM

## 2019-09-04 DIAGNOSIS — N289 Disorder of kidney and ureter, unspecified: Secondary | ICD-10-CM

## 2019-09-04 LAB — COMPREHENSIVE METABOLIC PANEL
ALT: 14 U/L (ref 0–44)
AST: 19 U/L (ref 15–41)
Albumin: 2.7 g/dL — ABNORMAL LOW (ref 3.5–5.0)
Alkaline Phosphatase: 78 U/L (ref 38–126)
Anion gap: 11 (ref 5–15)
BUN: 11 mg/dL (ref 8–23)
CO2: 29 mmol/L (ref 22–32)
Calcium: 8.3 mg/dL — ABNORMAL LOW (ref 8.9–10.3)
Chloride: 98 mmol/L (ref 98–111)
Creatinine, Ser: 1.07 mg/dL — ABNORMAL HIGH (ref 0.44–1.00)
GFR calc Af Amer: 53 mL/min — ABNORMAL LOW (ref >60)
GFR calc non Af Amer: 46 mL/min — ABNORMAL LOW (ref >60)
Glucose, Bld: 104 mg/dL — ABNORMAL HIGH (ref 70–99)
Potassium: 4 mmol/L (ref 3.5–5.1)
Sodium: 138 mmol/L (ref 135–145)
Total Bilirubin: 0.8 mg/dL (ref 0.3–1.2)
Total Protein: 6.4 g/dL — ABNORMAL LOW (ref 6.5–8.1)

## 2019-09-04 LAB — CBC WITH DIFFERENTIAL/PLATELET
Abs Immature Granulocytes: 0.03 10*3/uL (ref 0.00–0.07)
Basophils Absolute: 0.1 10*3/uL (ref 0.0–0.1)
Basophils Relative: 1 %
Eosinophils Absolute: 0.2 10*3/uL (ref 0.0–0.5)
Eosinophils Relative: 3 %
HCT: 31.5 % — ABNORMAL LOW (ref 36.0–46.0)
Hemoglobin: 9.6 g/dL — ABNORMAL LOW (ref 12.0–15.0)
Immature Granulocytes: 0 %
Lymphocytes Relative: 37 %
Lymphs Abs: 2.7 10*3/uL (ref 0.7–4.0)
MCH: 27.1 pg (ref 26.0–34.0)
MCHC: 30.5 g/dL (ref 30.0–36.0)
MCV: 89 fL (ref 80.0–100.0)
Monocytes Absolute: 0.6 10*3/uL (ref 0.1–1.0)
Monocytes Relative: 8 %
Neutro Abs: 3.6 10*3/uL (ref 1.7–7.7)
Neutrophils Relative %: 51 %
Platelets: 408 10*3/uL — ABNORMAL HIGH (ref 150–400)
RBC: 3.54 MIL/uL — ABNORMAL LOW (ref 3.87–5.11)
RDW: 15.5 % (ref 11.5–15.5)
WBC: 7.2 10*3/uL (ref 4.0–10.5)
nRBC: 0 % (ref 0.0–0.2)

## 2019-09-04 LAB — MAGNESIUM: Magnesium: 2 mg/dL (ref 1.7–2.4)

## 2019-09-04 LAB — PHOSPHORUS: Phosphorus: 3.3 mg/dL (ref 2.5–4.6)

## 2019-09-04 MED ORDER — IRBESARTAN 75 MG PO TABS
37.5000 mg | ORAL_TABLET | Freq: Every day | ORAL | Status: DC
  Administered 2019-09-04 – 2019-09-05 (×2): 37.5 mg via ORAL
  Filled 2019-09-04 (×2): qty 0.5

## 2019-09-04 MED ORDER — ENOXAPARIN SODIUM 30 MG/0.3ML ~~LOC~~ SOLN
30.0000 mg | SUBCUTANEOUS | Status: DC
  Administered 2019-09-04: 30 mg via SUBCUTANEOUS
  Filled 2019-09-04: qty 0.3

## 2019-09-04 NOTE — Consult Note (Signed)
Cardiology Consultation:   Patient ID: [name] MRN: [id number]; DOB: Jan 06, 1927  Admit date: 2019-09-10 Date of Consult: 09/04/2019  Primary Care Provider: [name], NP Primary Cardiologist: No primary care provider on file. HP MD  Primary Electrophysiologist:  None    Patient Profile:   [name] is a 85 y.o. female with a hx of Permanent AF since 2016, HTN, HLD, aortic stenosis hypothyroidism, iron def anemia, RA  who is being seen today for the evaluation of atrial fib at the request of Dr. Alfredia Ferguson..  History of Present Illness:   [name] pt has seen Dr. Acie Fredrickson in 2016 but has not seen cards since.   Previously refused anticoagulant, and last visit with Dr. Acie Fredrickson she was in a fib, refusing anticoagulation so it seems at that point she was rate control only.  EKGs since that time are all a fib.  No prior nuc studies or caths.   Now admitted 09-10-19 with dyspnea.  Recent hospitalization in 08/21/19 with E coli sepsis with UTI. And blood cultures with e coli.  She was + 1200 at discharge if accurate.   She was discharged with a fib rate control, on metoprolol.  Now admitted with SOB.  She has been diuresed.    Echo done yesterday with EF 35-40%, LA &RA moderately dilated.  Tr MR, mild TR, severe calcification of the aortic valve. The peak velocity and mean gradient are c/w mild AS but these are likely underestimated in the setting of severe LV dysfunction.  The AVA is calculated at 0.42 cm and dimensionless index 0.27. Visually valve appears at least moderate to severly stenotic.  Trivial pericardial effusion. Cannot rule out apical thrombus. Recommend limited study with definity contrast.   EKG:  The EKG was personally reviewed and demonstrates:  A fib at 102 with artifact making interpretation more difficult but no ST changes from prior 08/24/19 EKG Telemetry:  Telemetry was personally reviewed and demonstrates:  A fib rate controlled  Hs troponin 23,  25,26 , BNP 1352  On admit K+ 2.6 now 4.0  Na 138, Cr 1.07 Mg 2.0  hgb 9.6, hct 31 plts 408  DDimer 2.38 covid neg  PCXR  IMPRESSION: Features suggesting CHF/volume overload with cardiomegaly, interstitial edema and bilateral effusions. Aortic Atherosclerosis (ICD10-I70 VQ pending   Pt neg 3150  And wt down from 54.4 Kg to 53.9 kg  She is on lasix 40 daily and has rec'd 2 doses.   On her home metoprolol XL 75 mg daily.   BP 151/82 rate controlled no chest pain or SOB now. Was able to be flat in bed without SOB  No angina, though with eating too much pe rpt she has chest pain and SOB but always related to food and she has a large hiatal hernia.  Hx of lt ovarian mass on CT abd in 2019.   Heart Pathway Score:     Past Medical History:  Diagnosis Date  . A-fib ([name])   . Aortic stenosis, moderate    last echo in 2010; AVA 1.1; mild AS per echo in June 2013  . Edema of both legs    s/p laser treatment per Dr. Donnetta Hutching  . Hiatal hernia   . History of chicken pox   . HLD (hyperlipidemia)   . HTN (hypertension)   . Hypothyroidism   . IBS (irritable bowel syndrome)   . Iron deficiency anemia   . Neuropathic pain 09/06/2015  . Rheumatoid arthritis ([place])  Past Surgical History:  Procedure Laterality Date  . APPENDECTOMY  1955  . CARPAL TUNNEL RELEASE Right   . CATARACT EXTRACTION    . ENDOSCOPIC VEIN LASER TREATMENT    . ENDOVENOUS ABLATION SAPHENOUS VEIN W/ LASER  08-29-2012   left greater saphenous vein   Sherren Mocha Early MD  . ENDOVENOUS ABLATION SAPHENOUS VEIN W/ LASER  09-19-2012   right greater saphenous vein by Curt Jews MD  . EYE SURGERY  2005   Bilateral cataract  . FOOT SURGERY  2003   bilateral Hammer Toe  . stab phlebectomy Right 01-02-2013   10-15 incisions right thigh and calf by Curt Jews MD     Home Medications:  Prior to Admission medications   Medication Sig Start Date End Date Taking? Authorizing Provider  ALPRAZolam (XANAX) 0.25 MG tablet Take 1 tablet  (0.25 mg total) by mouth daily as needed for anxiety. 07/26/19  Yes Nche, Charlene Brooke, NP  aspirin 81 MG chewable tablet Chew 1 tablet (81 mg total) by mouth daily. 10/26/18  Yes Hennie Duos, MD  cefdinir (OMNICEF) 300 MG capsule Take 1 capsule (300 mg total) by mouth 2 (two) times daily for 12 days. 08/24/19 09/05/19 Yes Spongberg, Audie Pinto, MD  ciprofloxacin (CIPRO) 250 MG tablet Take 1 tablet (250 mg total) by mouth 2 (two) times daily. 08/28/19  Yes Nche, Charlene Brooke, NP  gabapentin (NEURONTIN) 100 MG capsule Take 1 capsule (100 mg total) by mouth at bedtime. 06/30/19  Yes Nche, Charlene Brooke, NP  levothyroxine (SYNTHROID) 75 MCG tablet Take 1 tablet (75 mcg total) by mouth daily before breakfast. 07/05/19  Yes Nche, Charlene Brooke, NP  metoprolol succinate (TOPROL-XL) 25 MG 24 hr tablet Take 3 tablets (75 mg total) by mouth daily. 06/30/19  Yes Nche, Charlene Brooke, NP  Olopatadine HCl 0.2 % SOLN Place 1 drop into both eyes daily. Patient taking differently: Place 1 drop into both eyes 4 (four) times daily.  10/26/18  Yes Hennie Duos, MD  acetaminophen (TYLENOL) 500 MG tablet Take 1,000 mg by mouth every 8 (eight) hours as needed (joint pain).     [provider]  Multiple Vitamin (MULTIVITAMIN WITH MINERALS) TABS tablet Take 1 tablet by mouth daily. Patient not taking: Reported on 08/22/2019 09/27/18   Kerney Elbe, DO    Inpatient Medications: Scheduled Meds: . aspirin  81 mg Oral Daily  . Chlorhexidine Gluconate Cloth  6 each Topical Daily  . enoxaparin (LOVENOX) injection  40 mg Subcutaneous Q24H  . furosemide  40 mg Intravenous Daily  . gabapentin  100 mg Oral QHS  . levothyroxine  75 mcg Oral Q0600  . metoprolol succinate  75 mg Oral Daily  . olopatadine  1 drop Both Eyes QID  . potassium chloride  40 mEq Oral BID  . sodium chloride flush  3 mL Intravenous Q12H   Continuous Infusions: . sodium chloride 250 mL (09/03/19 0613)   PRN Meds: sodium  chloride, acetaminophen **OR** acetaminophen, ALPRAZolam, sodium chloride flush  Allergies:    Allergies  Allergen Reactions  . Infliximab Other (See Comments)    REACTION: "Enlarged intestines and hernia. Also pushed intestines on lower part of lungs." REACTION: "Enlarged intestines and hernia. Also pushed intestines on lower part of lungs."  . Aspirin Nausea And Vomiting    Low Dose is ok Stomach problems  . Carvedilol Other (See Comments)    Fatigue/ ill Fatigue/ ill  . Codeine Nausea And Vomiting    "Deathly Sick"  .  Penicillins Rash    Has patient had a PCN reaction causing immediate rash, facial/tongue/throat swelling, SOB or lightheadedness with hypotension: Y Has patient had a PCN reaction causing severe rash involving mucus membranes or skin necrosis: Y Has patient had a PCN reaction that required hospitalization: N Has patient had a PCN reaction occurring within the last 10 years: N If all of the above answers are "NO", then may proceed with Cephalosporin use.     Social History:   Social History   Socioeconomic History  . Marital status: Widowed    Spouse name: Not on file  . Number of children: 2  . Years of education: Not on file  . Highest education level: Not on file  Occupational History  . Occupation: retired  Scientific laboratory technician  . Financial resource strain: Not on file  . Food insecurity    Worry: Not on file    Inability: Not on file  . Transportation needs    Medical: Not on file    Non-medical: Not on file  Tobacco Use  . Smoking status: Former Smoker    Types: Cigarettes    Quit date: 10/31/1983    Years since quitting: 35.8  . Smokeless tobacco: Never Used  Substance and Sexual Activity  . Alcohol use: No  . Drug use: No  . Sexual activity: Never  Lifestyle  . Physical activity    Days per week: Not on file    Minutes per session: Not on file  . Stress: Not on file  Relationships  . Social Herbalist on phone: Not on file    Gets  together: Not on file    Attends religious service: Not on file    Active member of club or organization: Not on file    Attends meetings of clubs or organizations: Not on file    Relationship status: Not on file  . Intimate partner violence    Fear of current or ex partner: Not on file    Emotionally abused: Not on file    Physically abused: Not on file    Forced sexual activity: Not on file  Other Topics Concern  . Not on file  Social History Narrative   Son lives with patient.    Family History:    Family History  Problem Relation Age of Onset  . Heart disease Mother   . Peripheral vascular disease Mother   . Heart disease Father   . Peripheral vascular disease Father        Right leg amputation  . COPD Father   . Heart disease Brother 76       Heart Disease before age 1  . Heart attack Brother   . Peripheral vascular disease Other   . Hypertension Sister      ROS:  Please see the history of present illness.  General:no colds or fevers, no weight changes Skin:no rashes or ulcers HEENT:no blurred vision, no congestion CV:see HPI PUL:see HPI GI:no diarrhea constipation or melena, + indigestion with overeating due to hiatal hernia GU:no hematuria, no dysuria recent UTI MS:no joint pain, no claudication Neuro:no syncope, no lightheadedness Endo:no diabetes, + thyroid disease  All other ROS reviewed and negative.     Physical Exam/Data:   Vitals:   09/03/19 0602 09/03/19 1306 09/03/19 2024 09/04/19 0517  BP: 114/74 134/80 (!) 159/81 (!) 151/82  Pulse: 78 83 75 83  Resp: 16 16 18 18   Temp: 98.1 F (36.7 C) 98.6 F (37  C) 98.2 F (36.8 C) 98.7 F (37.1 C)  TempSrc: Oral Oral Oral   SpO2: 97% 100% 99% 96%  Weight: 54 kg   53.9 kg  Height: 5' (1.524 m)       Intake/Output Summary (Last 24 hours) at 09/04/2019 0851 Last data filed at 09/04/2019 0541 Gross per 24 hour  Intake -  Output 1350 ml  Net -1350 ml   Last 3 Weights 09/04/2019 09/03/2019 09/02/2019   Weight (lbs) 118 lb 13.3 oz 119 lb 0.8 oz 120 lb  Weight (kg) 53.9 kg 54 kg 54.432 kg     Body mass index is 23.21 kg/m.  General:  Frail female, in no acute distress HEENT: normal Lymph: no adenopathy Neck: no JVD at 30 degrees Endocrine:  No thryomegaly Vascular: No carotid bruits; pedal pulses 2+ bilaterally   Cardiac:  normal S1, S2; RRR; [id number] systolic murmur  No gallup rub or click Lungs:  Clear to diminished to auscultation bilaterally, no wheezing, rhonchi or rales  Abd: soft, nontender, no hepatomegaly  Ext: no edema, + varicosities  Musculoskeletal:  No deformities, BUE and BLE strength normal and equal Skin: warm and dry  Neuro:  Alert and oriented X 3 MAE follows commands, no focal abnormalities noted Psych:  Normal affect    Relevant CV Studies: Echo 09/03/19  IMPRESSIONS    1. Left ventricular ejection fraction, by visual estimation, is 35-40%. There is mildly increased left ventricular hypertrophy of the basal septum.  2. There is akinesis of the apical septal, apical, apical inferior , mid anteroseptal and apical anterior walls.  3. Left ventricular diastolic function could not be evaluated due to underlying atrial fibrillation.  4. Elevated left ventricular end-diastolic pressure.  5. Global right ventricle has mildly reduced systolic function.The right ventricular size is normal. No increase in right ventricular wall thickness.  6. Left atrial size was moderately dilated.  7. Right atrial size was moderately dilated.  8. The mitral valve is normal in structure. Trace mitral valve regurgitation. No evidence of mitral stenosis.  9. The tricuspid valve is normal in structure. Tricuspid valve regurgitation is mild. 10. The aortic valve is tricuspid. There is Severely thickening of the aortic valve. There is Severe calcifcation of the aortic valve. Aortic valve regurgitation is trivial. No evidence of aortic valve sclerosis or stenosis. The peak velocity and mean   gradient are c/w mild AS but these are likely underestimated in the setting of severe LV dysfunction. The AVA is calculated at 0.42 cm and dimensionless index 0.27. Visually valve appears at least moderate to severly stenotic. 11. The pulmonic valve was normal in structure. Pulmonic valve regurgitation is trivial. 12. Mildly elevated pulmonary artery systolic pressure. 13. The inferior vena cava is normal in size with greater than 50% respiratory variability, suggesting right atrial pressure of 3 mmHg. 14. Trivial pericardial effusion is present. 15. The pericardial effusion is localized near the right atrium. 16. Cannot rule out apical thrombus. Recommend limited study with definity contrast.  FINDINGS  Left Ventricle: Left ventricular ejection fraction, by visual estimation, is 35 to 40%. There is mildly increased left ventricular hypertrophy. The left ventricular diastology could not be evaluated due to atrial fibrillation. Left ventricular diastolic  function could not be evaluated. Elevated left ventricular end-diastolic pressure. There is akinesis of the apical septal, apical, apical inferior , mid anteroseptal and apical anterior walls.  Right Ventricle: The right ventricular size is normal. No increase in right ventricular wall thickness. Global RV systolic function is  has mildly reduced systolic function. The tricuspid regurgitant velocity is 2.64 m/s, and with an assumed right atrial  pressure of 3 mmHg, the estimated right ventricular systolic pressure is mildly elevated at 30.9 mmHg.  Left Atrium: Left atrial size was moderately dilated.  Right Atrium: Right atrial size was moderately dilated  Pericardium: Trivial pericardial effusion is present. The pericardial effusion is localized near the right atrium.  Mitral Valve: The mitral valve is normal in structure. No evidence of mitral valve stenosis by observation. Trace mitral valve regurgitation.  Tricuspid Valve: The  tricuspid valve is normal in structure. Tricuspid valve regurgitation is mild.  Aortic Valve: The aortic valve is tricuspid. . There is Severely thickening and Severe calcifcation of the aortic valve. Aortic valve regurgitation is trivial. The aortic valve is structurally normal, with no evidence of sclerosis or stenosis. There is  Severely thickening of the aortic valve. Severe calcifcation. Aortic valve mean gradient measures 12.8 mmHg. Aortic valve peak gradient measures 21.8 mmHg. Aortic valve area, by VTI measures 0.42 cm.  Pulmonic Valve: The pulmonic valve was normal in structure. Pulmonic valve regurgitation is trivial.  Aorta: The aortic root, ascending aorta and aortic arch are all structurally normal, with no evidence of dilitation or obstruction.  Venous: The inferior vena cava is normal in size with greater than 50% respiratory variability, suggesting right atrial pressure of 3 mmHg.  IAS/Shunts: No atrial level shunt detected by color flow Doppler. No ventricular septal defect is seen or detected. There is no evidence of an atrial septal defect.    Laboratory Data:  High Sensitivity Troponin:   Recent Labs  Lab 09/02/19 1726 09/02/19 2121 09/03/19 0437  TROPONINIHS 23* 25* 26*     Chemistry Recent Labs  Lab 09/02/19 1726 09/03/19 0437 09/04/19 0509  NA 137 136 138  K 3.3* 2.6* 4.0  CL 102 97* 98  CO2 26 28 29   GLUCOSE 104* 116* 104*  BUN 9 9 11   CREATININE 0.83 0.95 1.07*  CALCIUM 8.1* 7.9* 8.3*  GFRNONAA >60 53* 46*  GFRAA >60 >60 53*  ANIONGAP 9 11 11     Recent Labs  Lab 09/02/19 1726 09/03/19 0437 09/04/19 0509  PROT 6.8 6.4* 6.4*  ALBUMIN 2.9* 2.7* 2.7*  AST 25 20 19   ALT 14 13 14   ALKPHOS 101 91 78  BILITOT 1.0 0.8 0.8   Hematology Recent Labs  Lab 09/02/19 1726 09/03/19 0437 09/04/19 0509  WBC 11.4* 11.8* 7.2  RBC 3.69* 3.49* 3.54*  HGB 9.9* 9.5* 9.6*  HCT 33.0* 30.8* 31.5*  MCV 89.4 88.3 89.0  MCH 26.8 27.2 27.1  MCHC  30.0 30.8 30.5  RDW 15.7* 15.4 15.5  PLT 463* 431* 408*   BNP Recent Labs  Lab 09/02/19 1727  BNP 1,352.1*    DDimer  Recent Labs  Lab 09/02/19 1726  DDIMER 2.38*     Radiology/Studies:  Dg Chest Port 1 View  Result Date: 09/02/2019 CLINICAL DATA:  Shortness of breath for 1 week EXAM: PORTABLE CHEST 1 VIEW COMPARISON:  Radiograph 08/24/2019 FINDINGS: Increasing hazy interstitial opacities throughout the lungs with cephalized, indistinct pulmonary vascularity and some septal thickening in the lung periphery. Hazy opacity obscures both hemidiaphragms suggesting a bilateral pleural effusions. Cardiomegaly is similar to comparison though portions of the cardiac borders are obscured by overlying opacity. The aorta is calcified. No pneumothorax. No acute osseous or soft tissue abnormality. Known hiatal hernia is poorly visualized. IMPRESSION: Features suggesting CHF/volume overload with cardiomegaly, interstitial edema and bilateral effusions.  Aortic Atherosclerosis (ICD10-I70.0). Electronically Signed   By: Lovena Le M.D.   On: 09/02/2019 18:07    Assessment and Plan:   1. Permanent atrial fib since 2016, pt has refused anticoagulation due to hx of GI Bleed.  CHA2DS2VASc of 5.  She has been aware of her risk of CVA continue BB 2. Acute CHF systolic/diastolic and with AS.  Neg 3150 since admit and wt decreased almost Kg. On lasix 40 mg IV per day.  May be able to change to po -was not on diuretics at home.  Would do low dose if Dr. Audie Box agrees 3. AS mod to severe, Dr. Audie Box to eval for reason for CHF in combination with decreased EF. Mild AS on echo 2018. Recent bacteremia with e coli  in Oct.  As well. 4. Cardiomyopathy with new decrease in EF (presumed new at least from 2018)  Last echo 2018 through [facility] with normal LV size and function.  Her a fib may be playing a role with decreased EF as well.       For questions or updates, please contact [facility] Please consult  [email or web address] for contact info under     Signed, Cecilie Kicks, NP  09/04/2019 8:51 AM

## 2019-09-04 NOTE — Progress Notes (Signed)
PROGRESS NOTE    [name]  [id number] DOB: July 25, 1927 DOA: 09/02/2019 PCP: [name], NP  Brief Narrative:  HPI per Dr. Jani Gravel on 09/02/2019 [name]  is a 85 y.o. female, w hypothyroidism, iron deficiency anemia, RA, hypertension, hyperlipidemia, Pafib, Aortic stenosis, was recently admitted on 08/21/2019, E. Coli sepsis, presents with dyspnea since discharge. Pt has had slight dry cough, pt denies fever, chills, cp, palp, n/v, abd, pain, dysuria, hematuria.  Pt denies orthopnea, pnd, weight gain.   In ED,  T 99.1  P 88 R 21, Bp 168/105  Pox 98% on RA Wt 54.4 kg  CXR IMPRESSION: Features suggesting CHF/volume overload with cardiomegaly, interstitial edema and bilateral effusions.  Aortic Atherosclerosis (ICD10-I70.0).  Wbc 11.4, Hgb 9.9 Plt 463  Na 137, K 3.3, Bun 9, Creatinine 0.83 Ast 25, Alt 14 D dimer 2.38 Trop 23 BNP 1,352.1  Ekg not in computer, will reorder.   Pt wil be admitted for acute CHF  **Interim History Continued to be a little bit dyspneic but not as much as yesterday.  Refusing CTA and VQ scan.  Continuing diuresis.  Cardiology was consulted for further evaluation recommendations and they are optimizing her heart failure medications.  Given her severely calcified aortic valve with restricted movement Dr. Davina Poke feels that this may represent low flow low gradient severe aortic stenosis and recommend an eventual evaluation for potential TAVR.  Assessment & Plan:   Principal Problem:   Acute CHF (congestive heart failure) (HCC) Active Problems:   Aortic stenosis   Hypothyroidism   Atrial fibrillation, chronic   Essential hypertension  Acute Respiratory Failure with Hypoxia in the setting of acute Systolic CHF, improving -Continue supplemental oxygen via nasal cannula and wean O2 as tolerated -continuous pulse oximetry and maintain O2 saturation greater 90% -Repeat CXR in AM -D Dimer was elevated but patient refused  CTA Chest PE and V/Q Scan  -We will need an ambulatory O2 screen prior to discharge and will do this in the a.m.  Acute Systolic and Acute on Chronic Diastolic CHF -C/w Tele -BNP was elevated at 1352 -high-sensitivity troponins are relatively flat and ranged from 23 and trended up to 26 -Echocardiogram shows new systolic dysfunction with an EF of 35 to [id number] and diastolic dysfunction could not be assessed due to atrial fibrillation -Continue with IV Lasix 40 mg p.o. daily today and then likely will be transitioned to p.o. diuretics in the a.m. -Cont Toprol XL 75mg  po qday -Strict I's/O's and Daily Weights; patient is -3.128 L since admission and weight is down 2 pounds since admission -Repeat CXR in AM  -Cardiology is consulted for further evaluation recommendations given her new systolic CHF and likely severe low-flow low gradient aortic stenosis and Dr. Davina Poke recommending adding irbesartan 3 7.5 mg p.o. daily and changing to p.o. Lasix in the a.m.  H/o Aortic Stenosis -ECHOCardiogram showed no actual stenosis but showed "The aortic valve is tricuspid. . There is Severely thickening and Severe calcifcation of the aortic valve. Aortic valve regurgitation is trivial. The aortic valve is structurally normal, with no evidence of sclerosis or stenosis. There is  Severely thickening of the aortic valve. Severe calcifcation. Aortic valve mean gradient measures 12.8 mmHg. Aortic valve peak gradient measures 21.8 mmHg. Aortic valve area, by VTI measures 0.42 cm." -Cardiology was consulted for further evaluation Dr. Marisue Ivan feel that her aortic valve is severely calcified with restricted movement may represent low-flow low gradient severe aortic stenosis and he feels that she  will likely need to be evaluated for potential TAVR in the near future  Atrial Fibrillation; Permanent -Continue with Telemetry -Currently in Atrial Fibrillation -Cont Toprol XL as above -Cont Aspirin 81mg  po qday for now -Has  refused Eliquis in the past and continues to do so  Renal Insuffiencey  -In the setting of Diuresis -Patient's BUN/Cr went from 9/0.83 -> 11/1.07 now -Avoid Nephrotoxic Medications if Possible -Continue to Monitor and Trend Renal Fxn Closely -Repeat CMP in AM   Hypothyroidism -Check TSH in AM  -Cont Levothyroxine 75 micrograms po qday  Neuropathy -Continue Gabapentin 100mg  po qhs  Anxiety -Continue Xanax 0.25mg  po qday prn   Hypokalemia, improved  -In the setting of diuresis; Improved -K+ this AM was 4.0 -Patient refused her IV potassium supplementation yesterday  -Continue to monitor and replete as necessary -Repeat CMP in a.m.  Hypomagnesemia -Patient's magnesium level this morning was 2.0 -Continue to monitor output as necessary -Repeat magnesium level in the a.m.  Normocytic Anemia The patient's hemoglobin/hematocrit is stable at 9.6/31.5 -Check Anemia Panel in the AM  -Continue to monitor for signs and symptoms of bleeding; currently no overt bleeding -Repeat CBC in a.m.  Leukocytosis -Mild and elevated at 11.9 -likely reactive in the setting of CHF -Continue to monitor and trend and repeat CBC in a.m.  Thrombocytosis  -platelet count on admission was 463,000 is now trending down to 408,000 -Continue to monitor and trend  DVT prophylaxis: Enoxaparin 40 mg sq q24h Code Status: FULL CODE  Family Communication: No family present at bedside  Disposition Plan: Home when Medically Stable and anticipate D/C in the next 24-48 hours  Consultants:   Will Consult Cardiology in AM    Procedures:  ECHOCARDIOGRAM IMPRESSIONS    1. Left ventricular ejection fraction, by visual estimation, is 35-40%. There is mildly increased left ventricular hypertrophy of the basal septum.  2. There is akinesis of the apical septal, apical, apical inferior , mid anteroseptal and apical anterior walls.  3. Left ventricular diastolic function could not be evaluated due to  underlying atrial fibrillation.  4. Elevated left ventricular end-diastolic pressure.  5. Global right ventricle has mildly reduced systolic function.The right ventricular size is normal. No increase in right ventricular wall thickness.  6. Left atrial size was moderately dilated.  7. Right atrial size was moderately dilated.  8. The mitral valve is normal in structure. Trace mitral valve regurgitation. No evidence of mitral stenosis.  9. The tricuspid valve is normal in structure. Tricuspid valve regurgitation is mild. 10. The aortic valve is tricuspid. There is Severely thickening of the aortic valve. There is Severe calcifcation of the aortic valve. Aortic valve regurgitation is trivial. No evidence of aortic valve sclerosis or stenosis. The peak velocity and mean  gradient are c/w mild AS but these are likely underestimated in the setting of severe LV dysfunction. The AVA is calculated at 0.42 cm and dimensionless index 0.27. Visually valve appears at least moderate to severly stenotic. 11. The pulmonic valve was normal in structure. Pulmonic valve regurgitation is trivial. 12. Mildly elevated pulmonary artery systolic pressure. 13. The inferior vena cava is normal in size with greater than 50% respiratory variability, suggesting right atrial pressure of 3 mmHg. 14. Trivial pericardial effusion is present. 15. The pericardial effusion is localized near the right atrium. 16. Cannot rule out apical thrombus. Recommend limited study with definity contrast.  FINDINGS  Left Ventricle: Left ventricular ejection fraction, by visual estimation, is 35 to 40%. There is mildly increased  left ventricular hypertrophy. The left ventricular diastology could not be evaluated due to atrial fibrillation. Left ventricular diastolic  function could not be evaluated. Elevated left ventricular end-diastolic pressure. There is akinesis of the apical septal, apical, apical inferior , mid anteroseptal and apical  anterior walls.  Right Ventricle: The right ventricular size is normal. No increase in right ventricular wall thickness. Global RV systolic function is has mildly reduced systolic function. The tricuspid regurgitant velocity is 2.64 m/s, and with an assumed right atrial  pressure of 3 mmHg, the estimated right ventricular systolic pressure is mildly elevated at 30.9 mmHg.  Left Atrium: Left atrial size was moderately dilated.  Right Atrium: Right atrial size was moderately dilated  Pericardium: Trivial pericardial effusion is present. The pericardial effusion is localized near the right atrium.  Mitral Valve: The mitral valve is normal in structure. No evidence of mitral valve stenosis by observation. Trace mitral valve regurgitation.  Tricuspid Valve: The tricuspid valve is normal in structure. Tricuspid valve regurgitation is mild.  Aortic Valve: The aortic valve is tricuspid. . There is Severely thickening and Severe calcifcation of the aortic valve. Aortic valve regurgitation is trivial. The aortic valve is structurally normal, with no evidence of sclerosis or stenosis. There is  Severely thickening of the aortic valve. Severe calcifcation. Aortic valve mean gradient measures 12.8 mmHg. Aortic valve peak gradient measures 21.8 mmHg. Aortic valve area, by VTI measures 0.42 cm.  Pulmonic Valve: The pulmonic valve was normal in structure. Pulmonic valve regurgitation is trivial.  Aorta: The aortic root, ascending aorta and aortic arch are all structurally normal, with no evidence of dilitation or obstruction.  Venous: The inferior vena cava is normal in size with greater than 50% respiratory variability, suggesting right atrial pressure of 3 mmHg.  IAS/Shunts: No atrial level shunt detected by color flow Doppler. No ventricular septal defect is seen or detected. There is no evidence of an atrial septal defect.     LEFT VENTRICLE PLAX 2D LVIDd:         3.30 cm        Diastology LVIDs:         2.00 cm       LV e' lateral:   7.56 cm/s LV PW:         1.00 cm       LV E/e' lateral: 14.8 LV IVS:        1.20 cm       LV e' medial:    5.44 cm/s LVOT diam:     1.40 cm       LV E/e' medial:  20.6 LV SV:         31 ml LV SV Index:   20.65 LVOT Area:     1.54 cm   LV Volumes (MOD) LV area d, A2C:    25.20 cm LV area d, A4C:    22.80 cm LV area s, A2C:    20.60 cm LV area s, A4C:    19.40 cm LV major d, A2C:   6.78 cm LV major d, A4C:   5.91 cm LV major s, A2C:   6.27 cm LV major s, A4C:   6.01 cm LV vol d, MOD A2C: 77.0 ml LV vol d, MOD A4C: 72.0 ml LV vol s, MOD A2C: 54.2 ml LV vol s, MOD A4C: 50.2 ml LV SV MOD A2C:     22.8 ml LV SV MOD A4C:     72.0 ml LV SV MOD BP:  26.6 ml  RIGHT VENTRICLE RV S prime:     7.18 cm/s TAPSE (M-mode): 1.4 cm  LEFT ATRIUM             Index LA diam:        4.50 cm 3.01 cm/m LA Vol (A2C):   54.7 ml 36.53 ml/m LA Vol (A4C):   73.3 ml 48.96 ml/m LA Biplane Vol: 65.6 ml 43.81 ml/m  AORTIC VALVE AV Area (Vmax):    0.44 cm AV Area (Vmean):   0.43 cm AV Area (VTI):     0.42 cm AV Vmax:           233.40 cm/s AV Vmean:          168.000 cm/s AV VTI:            0.469 m AV Peak Grad:      21.8 mmHg AV Mean Grad:      12.8 mmHg LVOT Vmax:         67.20 cm/s LVOT Vmean:        46.600 cm/s LVOT VTI:          0.128 m LVOT/AV VTI ratio: 0.27   AORTA Ao Root diam: 2.20 cm Ao Asc diam:  3.25 cm  MITRAL VALVE                        TRICUSPID VALVE MV Area (PHT): 5.14 cm             TR Peak grad:   27.9 mmHg MV PHT:        42.78 msec           TR Vmax:        264.00 cm/s MV Decel Time: 148 msec MV E velocity: 112.00 cm/s 103 cm/s SHUNTS                                     Systemic VTI:  0.13 m                                     Systemic Diam: 1.40 cm   Antimicrobials: Anti-infectives (From admission, onward)   None     Subjective: Seen and examined at bedside and he is laying in bed denied  any chest pain but states that she was still mildly short worker still asking for prescription for home oxygen.  No nausea or vomiting.  Still refusing Eliquis.  No other concerns or complaints at this time and think she is getting little bit better.  Objective: Vitals:   09/03/19 2024 09/04/19 0517 09/04/19 1016 09/04/19 1247  BP: (!) 159/81 (!) 151/82 134/71 138/75  Pulse: 75 83 88 80  Resp: 18 18  16   Temp: 98.2 F (36.8 C) 98.7 F (37.1 C)  98.2 F (36.8 C)  TempSrc: Oral   Oral  SpO2: 99% 96%  98%  Weight:  53.9 kg    Height:        Intake/Output Summary (Last 24 hours) at 09/04/2019 1700 Last data filed at 09/04/2019 1400 Gross per 24 hour  Intake 21.27 ml  Output 1350 ml  Net -1328.73 ml   Filed Weights   09/02/19 1655 09/03/19 0602 09/04/19 0517  Weight: 54.4 kg 54 kg 53.9 kg   Examination: Physical Exam:  Constitutional: Thin  elderly Caucasian female currently in NAD and appears calm but does appear slightly uncomfortable  Eyes: Lids and conjunctivae normal, sclerae anicteric  ENMT: External Ears, Nose appear normal. Slightly hard of hearing. Mucous membranes are moist.  Neck: Appears normal, supple, no cervical masses, normal ROM, no appreciable thyromegaly; no JVD Respiratory: Diminished to auscultation bilaterally, no wheezing, rales, rhonchi or crackles. Normal respiratory effort and patient is not tachypenic. No accessory muscle use. Unlabored breathing  Cardiovascular: Irregularly Irregular, no murmurs / rubs / gallops. S1 and S2 auscultated. Trace extremity edema Abdomen: Soft, non-tender, Distended mildly. No masses palpated. No appreciable hepatosplenomegaly. Bowel sounds positive x4.  GU: Deferred. Musculoskeletal: No clubbing / cyanosis of digits/nails. No joint deformity upper and lower extremities.  Skin: No rashes, lesions, ulcers on a limited skin evaluation. No induration; Warm and dry.  Neurologic: CN 2-12 grossly intact with no focal deficits.  Romberg sign and cerebellar reflexes not assessed.  Psychiatric: Normal judgment and insight. Alert and oriented x 3. Pleasant mood and appropriate affect.   Data Reviewed: I have personally reviewed following labs and imaging studies  CBC: Recent Labs  Lab 09/02/19 1726 09/03/19 0437 09/04/19 0509  WBC 11.4* 11.8* 7.2  NEUTROABS 8.7*  --  3.6  HGB 9.9* 9.5* 9.6*  HCT 33.0* 30.8* 31.5*  MCV 89.4 88.3 89.0  PLT 463* 431* [id number]*   Basic Metabolic Panel: Recent Labs  Lab 09/02/19 1726 09/03/19 0437 09/04/19 0509  NA 137 136 138  K 3.3* 2.6* 4.0  CL 102 97* 98  CO2 26 28 29   GLUCOSE 104* 116* 104*  BUN 9 9 11   CREATININE 0.83 0.95 1.07*  CALCIUM 8.1* 7.9* 8.3*  MG  --  1.3* 2.0  PHOS  --   --  3.3   GFR: Estimated Creatinine Clearance: 25.1 mL/min (A) (by C-G formula based on SCr of 1.07 mg/dL (H)). Liver Function Tests: Recent Labs  Lab 09/02/19 1726 09/03/19 0437 09/04/19 0509  AST 25 20 19   ALT 14 13 14   ALKPHOS 101 91 78  BILITOT 1.0 0.8 0.8  PROT 6.8 6.4* 6.4*  ALBUMIN 2.9* 2.7* 2.7*   No results for input(s): LIPASE, AMYLASE in the last 168 hours. No results for input(s): AMMONIA in the last 168 hours. Coagulation Profile: No results for input(s): INR, PROTIME in the last 168 hours. Cardiac Enzymes: No results for input(s): CKTOTAL, CKMB, CKMBINDEX, TROPONINI in the last 168 hours. BNP (last 3 results) No results for input(s): PROBNP in the last 8760 hours. HbA1C: No results for input(s): HGBA1C in the last 72 hours. CBG: No results for input(s): GLUCAP in the last 168 hours. Lipid Profile: No results for input(s): CHOL, HDL, LDLCALC, TRIG, CHOLHDL, LDLDIRECT in the last 72 hours. Thyroid Function Tests: No results for input(s): TSH, T4TOTAL, FREET4, T3FREE, THYROIDAB in the last 72 hours. Anemia Panel: No results for input(s): VITAMINB12, FOLATE, FERRITIN, TIBC, IRON, RETICCTPCT in the last 72 hours. Sepsis Labs: No results for input(s):  PROCALCITON, LATICACIDVEN in the last 168 hours.  Recent Results (from the past 240 hour(s))  SARS CORONAVIRUS 2 (TAT 6-24 HRS) Nasopharyngeal Nasopharyngeal Swab     Status: None   Collection Time: 09/02/19  6:59 PM   Specimen: Nasopharyngeal Swab  Result Value Ref Range Status   SARS Coronavirus 2 NEGATIVE NEGATIVE Final    Comment: (NOTE) SARS-CoV-2 target nucleic acids are NOT DETECTED. The SARS-CoV-2 RNA is generally detectable in upper and lower respiratory specimens during the acute phase of infection. Negative  results do not preclude SARS-CoV-2 infection, do not rule out co-infections with other pathogens, and should not be used as the sole basis for treatment or other patient management decisions. Negative results must be combined with clinical observations, patient history, and epidemiological information. The expected result is Negative. Fact Sheet for Patients: SugarRoll.be Fact Sheet for Healthcare Providers: https://www.woods-mathews.com/ This test is not yet approved or cleared by the [place] FDA and  has been authorized for detection and/or diagnosis of SARS-CoV-2 by FDA under an Emergency Use Authorization (EUA). This EUA will remain  in effect (meaning this test can be used) for the duration of the COVID-19 declaration under Section 56 4(b)(1) of the Act, 21 U.S.C. section 360bbb-3(b)(1), unless the authorization is terminated or revoked sooner. Performed at [facility], [place] [address] [place], [place] [address]     Radiology Studies: Dg Chest Port 1 View  Result Date: 09/02/2019 CLINICAL DATA:  Shortness of breath for 1 week EXAM: PORTABLE CHEST 1 VIEW COMPARISON:  Radiograph 08/24/2019 FINDINGS: Increasing hazy interstitial opacities throughout the lungs with cephalized, indistinct pulmonary vascularity and some septal thickening in the lung periphery. Hazy opacity obscures both hemidiaphragms suggesting a  bilateral pleural effusions. Cardiomegaly is similar to comparison though portions of the cardiac borders are obscured by overlying opacity. The aorta is calcified. No pneumothorax. No acute osseous or soft tissue abnormality. Known hiatal hernia is poorly visualized. IMPRESSION: Features suggesting CHF/volume overload with cardiomegaly, interstitial edema and bilateral effusions. Aortic Atherosclerosis (ICD10-I70.0). Electronically Signed   By: Lovena Le M.D.   On: 09/02/2019 18:07    Scheduled Meds: . aspirin  81 mg Oral Daily  . Chlorhexidine Gluconate Cloth  6 each Topical Daily  . enoxaparin (LOVENOX) injection  30 mg Subcutaneous Q24H  . furosemide  40 mg Intravenous Daily  . gabapentin  100 mg Oral QHS  . irbesartan  37.5 mg Oral Daily  . levothyroxine  75 mcg Oral Q0600  . metoprolol succinate  75 mg Oral Daily  . olopatadine  1 drop Both Eyes QID  . potassium chloride  40 mEq Oral BID  . sodium chloride flush  3 mL Intravenous Q12H   Continuous Infusions: . sodium chloride 250 mL (09/03/19 [id number])    LOS: 2 days   Kerney Elbe, DO [facility] PAGER is on [place]  If 7PM-7AM, please contact night-coverage [email or web address] Password [email or web address] 09/04/2019, 5:00 PM

## 2019-09-05 ENCOUNTER — Telehealth: Payer: Self-pay

## 2019-09-05 ENCOUNTER — Inpatient Hospital Stay (HOSPITAL_COMMUNITY): Payer: Medicare Other

## 2019-09-05 DIAGNOSIS — I429 Cardiomyopathy, unspecified: Secondary | ICD-10-CM

## 2019-09-05 LAB — CBC WITH DIFFERENTIAL/PLATELET
Abs Immature Granulocytes: 0.05 10*3/uL (ref 0.00–0.07)
Basophils Absolute: 0.1 10*3/uL (ref 0.0–0.1)
Basophils Relative: 1 %
Eosinophils Absolute: 0.3 10*3/uL (ref 0.0–0.5)
Eosinophils Relative: 2 %
HCT: 34.8 % — ABNORMAL LOW (ref 36.0–46.0)
Hemoglobin: 10.4 g/dL — ABNORMAL LOW (ref 12.0–15.0)
Immature Granulocytes: 0 %
Lymphocytes Relative: 25 %
Lymphs Abs: 3 10*3/uL (ref 0.7–4.0)
MCH: 26.7 pg (ref 26.0–34.0)
MCHC: 29.9 g/dL — ABNORMAL LOW (ref 30.0–36.0)
MCV: 89.2 fL (ref 80.0–100.0)
Monocytes Absolute: 0.8 10*3/uL (ref 0.1–1.0)
Monocytes Relative: 6 %
Neutro Abs: 8 10*3/uL — ABNORMAL HIGH (ref 1.7–7.7)
Neutrophils Relative %: 66 %
Platelets: 445 10*3/uL — ABNORMAL HIGH (ref 150–400)
RBC: 3.9 MIL/uL (ref 3.87–5.11)
RDW: 15.6 % — ABNORMAL HIGH (ref 11.5–15.5)
WBC: 12.2 10*3/uL — ABNORMAL HIGH (ref 4.0–10.5)
nRBC: 0 % (ref 0.0–0.2)

## 2019-09-05 LAB — VITAMIN B12: Vitamin B-12: 540 pg/mL (ref 180–914)

## 2019-09-05 LAB — ECHOCARDIOGRAM LIMITED
Height: 60 in
Weight: 1827.17 oz

## 2019-09-05 LAB — BASIC METABOLIC PANEL
Anion gap: 11 (ref 5–15)
BUN: 15 mg/dL (ref 8–23)
CO2: 27 mmol/L (ref 22–32)
Calcium: 8.5 mg/dL — ABNORMAL LOW (ref 8.9–10.3)
Chloride: 98 mmol/L (ref 98–111)
Creatinine, Ser: 1.12 mg/dL — ABNORMAL HIGH (ref 0.44–1.00)
GFR calc Af Amer: 50 mL/min — ABNORMAL LOW (ref >60)
GFR calc non Af Amer: 43 mL/min — ABNORMAL LOW (ref >60)
Glucose, Bld: 103 mg/dL — ABNORMAL HIGH (ref 70–99)
Potassium: 5 mmol/L (ref 3.5–5.1)
Sodium: 136 mmol/L (ref 135–145)

## 2019-09-05 LAB — RETICULOCYTES
Immature Retic Fract: 14.7 % (ref 2.3–15.9)
RBC.: 3.87 MIL/uL (ref 3.87–5.11)
Retic Count, Absolute: 69.3 10*3/uL (ref 19.0–186.0)
Retic Ct Pct: 1.8 % (ref 0.4–3.1)

## 2019-09-05 LAB — FOLATE: Folate: 20 ng/mL (ref >5.9)

## 2019-09-05 LAB — FERRITIN: Ferritin: 48 ng/mL (ref 11–307)

## 2019-09-05 LAB — PHOSPHORUS: Phosphorus: 3.9 mg/dL (ref 2.5–4.6)

## 2019-09-05 LAB — MAGNESIUM: Magnesium: 1.8 mg/dL (ref 1.7–2.4)

## 2019-09-05 LAB — TSH: TSH: 1.17 u[IU]/mL (ref 0.350–4.500)

## 2019-09-05 LAB — IRON AND TIBC
Iron: 30 ug/dL (ref 28–170)
Saturation Ratios: 10 % — ABNORMAL LOW (ref 10.4–31.8)
TIBC: 303 ug/dL (ref 250–450)
UIBC: 273 ug/dL

## 2019-09-05 MED ORDER — FUROSEMIDE 40 MG PO TABS: 40.0000 mg | ORAL_TABLET | Freq: Every day | ORAL | 0 refills | Status: DC

## 2019-09-05 MED ORDER — IRBESARTAN 75 MG PO TABS: 37.5000 mg | ORAL_TABLET | Freq: Every day | ORAL | 0 refills | Status: DC

## 2019-09-05 MED ORDER — FUROSEMIDE 40 MG PO TABS
40.0000 mg | ORAL_TABLET | Freq: Every day | ORAL | Status: DC
  Administered 2019-09-05: 40 mg via ORAL
  Filled 2019-09-05: qty 1

## 2019-09-05 MED ORDER — PERFLUTREN LIPID MICROSPHERE
1.0000 mL | INTRAVENOUS | Status: AC | PRN
  Administered 2019-09-05: 09:00:00 2 mL via INTRAVENOUS
  Filled 2019-09-05: qty 10

## 2019-09-05 NOTE — Telephone Encounter (Signed)
Spoke to patient advised Dr.ONeil wanted me to schedule you appointment with him 09/12/19.Appointment scheduled 11/13 at 9:40 am.Directions to office given to patient.

## 2019-09-05 NOTE — Discharge Summary (Signed)
Physician Discharge Summary  [name] [id number] DOB: 1926-12-14 DOA: 09/02/2019  PCP: Sydney Buffy, NP  Admit date: 09/02/2019 Discharge date: 09/05/2019  Admitted From: Home Disposition: Home  Recommendations for Outpatient Follow-up:  1. Follow up with PCP in 1-2 weeks 2. Follow up with Cardiology within 1-2 weeks 3. Please obtain CMP/CBC, Mag, Phos in one week 4. Please follow up on the following pending results:  Home Health: No Equipment/Devices: None   Discharge Condition: Stable CODE STATUS: FULL CODE Diet recommendation: Heart Healthy Diet   Brief/Interim Summary: HPI per Dr. Jani Gravel on 09/02/2019 [name] a89 y.o.female,w hypothyroidism, iron deficiency anemia, RA, hypertension, hyperlipidemia, Pafib, Aortic stenosis, was recently admitted on 08/21/2019, E. Coli sepsis, presents with dyspnea since discharge. Pt has had slight dry cough, pt denies fever, chills, cp, palp, n/v, abd, pain, dysuria, hematuria. Pt denies orthopnea, pnd, weight gain.   In ED,  T 99.1 P 88 R 21, Bp 168/105 Pox 98% on RA Wt 54.4 kg  CXR IMPRESSION: Features suggesting CHF/volume overload with cardiomegaly, interstitial edema and bilateral effusions.  Aortic Atherosclerosis (ICD10-I70.0).  Wbc 11.4, Hgb 9.9 Plt 463  Na 137, K 3.3, Bun 9, Creatinine 0.83 Ast 25, Alt 14 D dimer 2.38 Trop 23 BNP 1,352.1  Ekg not in computer, will reorder.   Pt wil be admitted for acute CHF  **Interim History Continued to be a little bit dyspneic but not as much as yesterday and has improved.  Refusing CTA and VQ scan.  Continuing diuresis and this was changed to po.  Cardiology was consulted for further evaluation recommendations and they are optimizing her heart failure medications.  Given her severely calcified aortic valve with restricted movement Dr. Audie Box feels that this may represent low flow low gradient severe aortic stenosis and recommend an eventual  evaluation for potential TAVR.  Cardiology deemed the patient stable for discharge and she will need to follow-up with them within 1 week and she will also need to see her PCP within 1 week.  Will not give the patient any more antibiotics as she had completed treatment for E. coli bacteremia for 7 days prior to even getting admitted.  Discharge Diagnoses:  Principal Problem:   Acute CHF (congestive heart failure) (HCC) Active Problems:   Aortic stenosis   Hypothyroidism   Atrial fibrillation, chronic   Essential hypertension  Acute Respiratory Failure with Hypoxia in the setting of acute Systolic CHF, improving -Continue supplemental oxygen via nasal cannula and wean O2 as tolerated -continuous pulse oximetry and maintain O2 saturation greater 90% -Repeat CXR as an outpatient -D Dimer was elevated but patient refused CTA Chest PE and V/Q Scan  -We will need an ambulatory O2 screen prior to discharge and will do this in the a.m. and she did not desaturate  Acute Systolic and Acute on Chronic Diastolic CHF, improving -C/w Tele -BNP was elevated at 1352 -high-sensitivity troponins are relatively flat and ranged from 23 and trended up to 26 -Echocardiogram shows new systolic dysfunction with an EF of 35 to [id number] and diastolic dysfunction could not be assessed due to atrial fibrillation -Continued with IV Lasix 40 mg p.o. daily ill be transitioned to p.o. diuretics this Am -Cont Toprol XL 75mg  po qday -Strict I's/O's and Daily Weights; patient is -2.405 L since admission and weight is down 2 pounds since admission -Repeat CXR as an outpatient -Cardiology is consulted for further evaluation recommendations given her new systolic CHF and likely severe low-flow low gradient aortic stenosis and  Dr. Audie Box recommending adding irbesartan 3 7.5 mg p.o. daily and changing to p.o. Lasix this AM -She will need to see cardiology within 1 week  H/o Aortic Stenosis -ECHOCardiogram showed no actual  stenosis but showed "The aortic valve is tricuspid. . There is Severely thickening and Severe calcifcation of the aortic valve. Aortic valve regurgitation is trivial. The aortic valve is structurally normal, with no evidence of sclerosis or stenosis. There is  Severely thickening of the aortic valve. Severe calcifcation. Aortic valve mean gradient measures 12.8 mmHg. Aortic valve peak gradient measures 21.8 mmHg. Aortic valve area, by VTI measures 0.42 cm." -Cardiology was consulted for further evaluation Dr. Marisue Ivan feel that her aortic valve is severely calcified with restricted movement may represent low-flow low gradient severe aortic stenosis and he feels that she will likely need to be evaluated for potential TAVR in the near future -She has a follow-up appointment with Dr. Audie Box within 1 week  Atrial Fibrillation; Permanent -Continue with Telemetry -Currently in Atrial Fibrillation -Cont Toprol XL as above -Cont Aspirin 81mg  po qday for now -Has refused Eliquis in the past and continues to do so  Renal Insuffiencey  -In the setting of Diuresis -Patient's BUN/Cr went from 9/0.83 -> 11/1.07 -> 15/1.12 -Avoid Nephrotoxic Medications if Possible -Continue to Monitor and Trend Renal Fxn Closely -Repeat CMP as an outpatient   Hypothyroidism -Checked TSH and was 1.170 -Cont Levothyroxine 75 micrograms po qday  Neuropathy -Continue Gabapentin 100mg  po qhs  Anxiety -Continue Xanax 0.25mg  po qday prn  Hypokalemia, improved  -In the setting of diuresis; Improved -K+ this AM was 5.0 -Continue to monitor and replete as necessary -Repeat CMP within 1 week   Hypomagnesemia -Patient's magnesium level this morning was 1.8 -Continue to monitor output as necessary -Repeat magnesium level in the a.m.  Normocytic Anemia The patient's hemoglobin/hematocrit is stable at 9.6/31.5 -Check Anemia Panel in the AM  -Continue to monitor for signs and symptoms of bleeding; currently no  overt bleeding -Repeat CBC in a.m.  Leukocytosis -Mild and elevated at 11.9 and had improved to 7.2 and back up to 12.2 -likely reactive in the setting of CHF and now Diuresis with hemoconcentration -Continue to monitor and trend and repeat CBC in a.m. -Do not feel like this is related to UTI as she is completely treated for her E. coli bacteremia -Continue to monitor and trend and repeat in an outpatient setting  Thrombocytosis  -platelet count on admission was 463,000 is now trending down to 408,000 and now is back up to 445,000 -Continue to monitor and trend -Repeat CBC in the outpatient setting  Recent E Coli Bacteremia  -Completed Abx Course for 7 Days prior to being admitted so we will discontinue antibiotics at discharge  Discharge Instructions   Allergies as of 09/05/2019      Reactions   Infliximab Other (See Comments)   REACTION: "Enlarged intestines and hernia. Also pushed intestines on lower part of lungs." REACTION: "Enlarged intestines and hernia. Also pushed intestines on lower part of lungs."   Aspirin Nausea And Vomiting   Low Dose is ok Stomach problems   Carvedilol Other (See Comments)   Fatigue/ ill Fatigue/ ill   Codeine Nausea And Vomiting   "Deathly Sick"   Penicillins Rash   Has patient had a PCN reaction causing immediate rash, facial/tongue/throat swelling, SOB or lightheadedness with hypotension: Y Has patient had a PCN reaction causing severe rash involving mucus membranes or skin necrosis: Y Has patient had a PCN reaction  that required hospitalization: N Has patient had a PCN reaction occurring within the last 10 years: N If all of the above answers are "NO", then may proceed with Cephalosporin use.      Medication List    STOP taking these medications   cefdinir 300 MG capsule Commonly known as: OMNICEF   ciprofloxacin 250 MG tablet Commonly known as: Cipro     TAKE these medications   acetaminophen 500 MG tablet Commonly known as:  TYLENOL Take 1,000 mg by mouth every 8 (eight) hours as needed (joint pain).   ALPRAZolam 0.25 MG tablet Commonly known as: XANAX Take 1 tablet (0.25 mg total) by mouth daily as needed for anxiety.   aspirin 81 MG chewable tablet Chew 1 tablet (81 mg total) by mouth daily.   furosemide 40 MG tablet Commonly known as: LASIX Take 1 tablet (40 mg total) by mouth daily. Start taking on: September 06, 2019   gabapentin 100 MG capsule Commonly known as: NEURONTIN Take 1 capsule (100 mg total) by mouth at bedtime.   irbesartan 75 MG tablet Commonly known as: AVAPRO Take 0.5 tablets (37.5 mg total) by mouth daily. Start taking on: September 06, 2019   levothyroxine 75 MCG tablet Commonly known as: SYNTHROID Take 1 tablet (75 mcg total) by mouth daily before breakfast.   metoprolol succinate 25 MG 24 hr tablet Commonly known as: TOPROL-XL Take 3 tablets (75 mg total) by mouth daily.   multivitamin with minerals Tabs tablet Take 1 tablet by mouth daily.   Olopatadine HCl 0.2 % Soln Place 1 drop into both eyes daily. What changed: when to take this      Follow-up Information    Burnell Blanks, MD Follow up on 09/11/2019.   Specialty: Cardiology Why: Please arrive 15 minutes early for your 8:45am [facility] appointment to discuss your aortic valve stenosis Contact information: [place] [place] [address] [phone]        O'Neal, [place] Thomas, MD Follow up on 09/12/2019.   Specialties: Internal Medicine, Cardiology Why: Please arrive 15 minutes early for your 9:40am post-hospital general cardiology appointment Contact information: [place] [address] [phone]          Allergies  Allergen Reactions  . Infliximab Other (See Comments)    REACTION: "Enlarged intestines and hernia. Also pushed intestines on lower part of lungs." REACTION: "Enlarged intestines and hernia. Also pushed intestines on lower part  of lungs."  . Aspirin Nausea And Vomiting    Low Dose is ok Stomach problems  . Carvedilol Other (See Comments)    Fatigue/ ill Fatigue/ ill  . Codeine Nausea And Vomiting    "Deathly Sick"  . Penicillins Rash    Has patient had a PCN reaction causing immediate rash, facial/tongue/throat swelling, SOB or lightheadedness with hypotension: Y Has patient had a PCN reaction causing severe rash involving mucus membranes or skin necrosis: Y Has patient had a PCN reaction that required hospitalization: N Has patient had a PCN reaction occurring within the last 10 years: N If all of the above answers are "NO", then may proceed with Cephalosporin use.    Consultations:  Cardiology  Procedures/Studies: Dg Chest Port 1 View  Result Date: 09/02/2019 CLINICAL DATA:  Shortness of breath for 1 week EXAM: PORTABLE CHEST 1 VIEW COMPARISON:  Radiograph 08/24/2019 FINDINGS: Increasing hazy interstitial opacities throughout the lungs with cephalized, indistinct pulmonary vascularity and some septal thickening in the lung periphery. Hazy opacity obscures  both hemidiaphragms suggesting a bilateral pleural effusions. Cardiomegaly is similar to comparison though portions of the cardiac borders are obscured by overlying opacity. The aorta is calcified. No pneumothorax. No acute osseous or soft tissue abnormality. Known hiatal hernia is poorly visualized. IMPRESSION: Features suggesting CHF/volume overload with cardiomegaly, interstitial edema and bilateral effusions. Aortic Atherosclerosis (ICD10-I70.0). Electronically Signed   By: Lovena Le M.D.   On: 09/02/2019 18:07   Dg Chest Port 1 View  Result Date: 08/24/2019 CLINICAL DATA:  Shortness of breath EXAM: PORTABLE CHEST 1 VIEW COMPARISON:  Chest radiograph dated 08/21/2019 FINDINGS: The heart is enlarged. Vascular calcifications are seen in the aortic arch. The lungs are clear. There is no pleural effusion or pneumothorax. A hiatal hernia is redemonstrated.  IMPRESSION: Cardiomegaly. No consolidation or effusion. Electronically Signed   By: Zerita Boers M.D.   On: 08/24/2019 11:33   Dg Chest Port 1 View  Result Date: 08/21/2019 CLINICAL DATA:  Pt from home. Pt's son called EMS because has had SOB and fever for 2 days. Pt c/o of burning with urination. PT HX: ex smoker, HTN EXAM: PORTABLE CHEST 1 VIEW COMPARISON:  10/13/2018 FINDINGS: The heart is enlarged and stable in configuration. There is atherosclerotic calcification of the thoracic aorta. The lungs are free of focal consolidations and pleural effusions. Large hiatal hernia. IMPRESSION: 1. Stable cardiomegaly. 2. Large hiatal hernia. 3.  Aortic atherosclerosis.  (ICD10-I70.0) Electronically Signed   By: Nolon Nations M.D.   On: 08/21/2019 18:33    ECHOCARDIOGRAM 11/4 IMPRESSIONS    1. Left ventricular ejection fraction, by visual estimation, is 35-40%. There is mildly increased left ventricular hypertrophy of the basal septum.  2. There is akinesis of the apical septal, apical, apical inferior , mid anteroseptal and apical anterior walls.  3. Left ventricular diastolic function could not be evaluated due to underlying atrial fibrillation.  4. Elevated left ventricular end-diastolic pressure.  5. Global right ventricle has mildly reduced systolic function.The right ventricular size is normal. No increase in right ventricular wall thickness.  6. Left atrial size was moderately dilated.  7. Right atrial size was moderately dilated.  8. The mitral valve is normal in structure. Trace mitral valve regurgitation. No evidence of mitral stenosis.  9. The tricuspid valve is normal in structure. Tricuspid valve regurgitation is mild. 10. The aortic valve is tricuspid. There is Severely thickening of the aortic valve. There is Severe calcifcation of the aortic valve. Aortic valve regurgitation is trivial. No evidence of aortic valve sclerosis or stenosis. The peak velocity and mean  gradient are c/w  mild AS but these are likely underestimated in the setting of severe LV dysfunction. The AVA is calculated at 0.42 cm and dimensionless index 0.27. Visually valve appears at least moderate to severly stenotic. 11. The pulmonic valve was normal in structure. Pulmonic valve regurgitation is trivial. 12. Mildly elevated pulmonary artery systolic pressure. 13. The inferior vena cava is normal in size with greater than 50% respiratory variability, suggesting right atrial pressure of 3 mmHg. 14. Trivial pericardial effusion is present. 15. The pericardial effusion is localized near the right atrium. 16. Cannot rule out apical thrombus. Recommend limited study with definity contrast.  FINDINGS  Left Ventricle: Left ventricular ejection fraction, by visual estimation, is 35 to 40%. There is mildly increased left ventricular hypertrophy. The left ventricular diastology could not be evaluated due to atrial fibrillation. Left ventricular diastolic  function could not be evaluated. Elevated left ventricular end-diastolic pressure. There is akinesis of the  apical septal, apical, apical inferior , mid anteroseptal and apical anterior walls.  Right Ventricle: The right ventricular size is normal. No increase in right ventricular wall thickness. Global RV systolic function is has mildly reduced systolic function. The tricuspid regurgitant velocity is 2.64 m/s, and with an assumed right atrial  pressure of 3 mmHg, the estimated right ventricular systolic pressure is mildly elevated at 30.9 mmHg.  Left Atrium: Left atrial size was moderately dilated.  Right Atrium: Right atrial size was moderately dilated  Pericardium: Trivial pericardial effusion is present. The pericardial effusion is localized near the right atrium.  Mitral Valve: The mitral valve is normal in structure. No evidence of mitral valve stenosis by observation. Trace mitral valve regurgitation.  Tricuspid Valve: The tricuspid valve is  normal in structure. Tricuspid valve regurgitation is mild.  Aortic Valve: The aortic valve is tricuspid. . There is Severely thickening and Severe calcifcation of the aortic valve. Aortic valve regurgitation is trivial. The aortic valve is structurally normal, with no evidence of sclerosis or stenosis. There is  Severely thickening of the aortic valve. Severe calcifcation. Aortic valve mean gradient measures 12.8 mmHg. Aortic valve peak gradient measures 21.8 mmHg. Aortic valve area, by VTI measures 0.42 cm.  Pulmonic Valve: The pulmonic valve was normal in structure. Pulmonic valve regurgitation is trivial.  Aorta: The aortic root, ascending aorta and aortic arch are all structurally normal, with no evidence of dilitation or obstruction.  Venous: The inferior vena cava is normal in size with greater than 50% respiratory variability, suggesting right atrial pressure of 3 mmHg.  IAS/Shunts: No atrial level shunt detected by color flow Doppler. No ventricular septal defect is seen or detected. There is no evidence of an atrial septal defect.     LEFT VENTRICLE PLAX 2D LVIDd:         3.30 cm       Diastology LVIDs:         2.00 cm       LV e' lateral:   7.56 cm/s LV PW:         1.00 cm       LV E/e' lateral: 14.8 LV IVS:        1.20 cm       LV e' medial:    5.44 cm/s LVOT diam:     1.40 cm       LV E/e' medial:  20.6 LV SV:         31 ml LV SV Index:   20.65 LVOT Area:     1.54 cm   LV Volumes (MOD) LV area d, A2C:    25.20 cm LV area d, A4C:    22.80 cm LV area s, A2C:    20.60 cm LV area s, A4C:    19.40 cm LV major d, A2C:   6.78 cm LV major d, A4C:   5.91 cm LV major s, A2C:   6.27 cm LV major s, A4C:   6.01 cm LV vol d, MOD A2C: 77.0 ml LV vol d, MOD A4C: 72.0 ml LV vol s, MOD A2C: 54.2 ml LV vol s, MOD A4C: 50.2 ml LV SV MOD A2C:     22.8 ml LV SV MOD A4C:     72.0 ml LV SV MOD BP:      26.6 ml  RIGHT VENTRICLE RV S prime:     7.18 cm/s TAPSE (M-mode):  1.4 cm  LEFT ATRIUM  Index LA diam:        4.50 cm 3.01 cm/m LA Vol (A2C):   54.7 ml 36.53 ml/m LA Vol (A4C):   73.3 ml 48.96 ml/m LA Biplane Vol: 65.6 ml 43.81 ml/m  AORTIC VALVE AV Area (Vmax):    0.44 cm AV Area (Vmean):   0.43 cm AV Area (VTI):     0.42 cm AV Vmax:           233.40 cm/s AV Vmean:          168.000 cm/s AV VTI:            0.469 m AV Peak Grad:      21.8 mmHg AV Mean Grad:      12.8 mmHg LVOT Vmax:         67.20 cm/s LVOT Vmean:        46.600 cm/s LVOT VTI:          0.128 m LVOT/AV VTI ratio: 0.27   AORTA Ao Root diam: 2.20 cm Ao Asc diam:  3.25 cm  MITRAL VALVE                        TRICUSPID VALVE MV Area (PHT): 5.14 cm             TR Peak grad:   27.9 mmHg MV PHT:        42.78 msec           TR Vmax:        264.00 cm/s MV Decel Time: 148 msec MV E velocity: 112.00 cm/s 103 cm/s SHUNTS                                     Systemic VTI:  0.13 m                                     Systemic Diam: 1.40 cm   LIMITED ECHOCARDIOGRAM 09/05/2019 IMPRESSIONS    1. Left ventricular ejection fraction, by visual estimation, is 35 to 40%. The left ventricle has moderately decreased function. There is mildly increased left ventricular hypertrophy.  2. Mid and apical anterior wall, mid and apical anterior septum, and apex are abnormal.  3. No apical thrombus seen  4. Definity contrast agent was given IV to delineate the left ventricular endocardial borders.  FINDINGS  Left Ventricle: Left ventricular ejection fraction, by visual estimation, is 35 to 40%. The left ventricle has moderately decreased function. Definity contrast agent was given IV to delineate the left ventricular endocardial borders. There is mildly  increased left ventricular hypertrophy.    LV Wall Scoring: The mid and apical anterior wall, mid and apical anterior septum, and apex are akinetic. All remaining scored segments are normal.  Right Ventricle: The right  ventricular size is not assessed. Right vetricular wall thickness was not assessed. Global RV systolic function is was not assessed.  Left Atrium: Left atrial size was not assessed.  Right Atrium: Right atrial size was not assessed  Pericardium: The pericardium was not assessed.  Mitral Valve: The mitral valve was not assessed. Not assessed mitral valve regurgitation.  Tricuspid Valve: The tricuspid valve is not assessed. Tricuspid valve regurgitation not assessed.  Aortic Valve: The aortic valve was not assessed. Aortic valve regurgitation not assessed.  Pulmonic Valve: The  pulmonic valve was not assessed. Pulmonic valve regurgitation not assessed.  Aorta: Aortic root could not be assessed.  Shunts: The interatrial septum was not assessed.   Subjective: Seen and examined at bedside she is feeling better.  Denies chest pain, lightheadedness or dizziness but still asking about why she did not qualify for oxygen.  Denies any other complaints or concerns at this stable for discharge and she will follow up with cardiology within 1 week.  Discharge Exam: Vitals:   09/05/19 0931 09/05/19 1100  BP: 119/64 (!) 105/56  Pulse: 75 79  Resp:  (!) 22  Temp:    SpO2:  98%   Vitals:   09/05/19 0510 09/05/19 0900 09/05/19 0931 09/05/19 1100  BP: 131/81 (!) 117/103 119/64 (!) 105/56  Pulse: 91 (!) 103 75 79  Resp: 18   (!) 22  Temp: 98.7 F (37.1 C)     TempSrc:      SpO2: 96%   98%  Weight: 51.8 kg     Height:       General: Pt is alert, awake, not in acute distress Cardiovascular: RRR, S1/S2 +, no rubs, no gallops; has a systolic murmur noted Respiratory: Diminished bilaterally, no wheezing, no rhonchi Abdominal: Soft, NT, ND, bowel sounds + Extremities: No appreciable edema, no cyanosis  The results of significant diagnostics from this hospitalization (including imaging, microbiology, ancillary and laboratory) are listed below for reference.    Microbiology: Recent  Results (from the past 240 hour(s))  SARS CORONAVIRUS 2 (TAT 6-24 HRS) Nasopharyngeal Nasopharyngeal Swab     Status: None   Collection Time: 09/02/19  6:59 PM   Specimen: Nasopharyngeal Swab  Result Value Ref Range Status   SARS Coronavirus 2 NEGATIVE NEGATIVE Final    Comment: (NOTE) SARS-CoV-2 target nucleic acids are NOT DETECTED. The SARS-CoV-2 RNA is generally detectable in upper and lower respiratory specimens during the acute phase of infection. Negative results do not preclude SARS-CoV-2 infection, do not rule out co-infections with other pathogens, and should not be used as the sole basis for treatment or other patient management decisions. Negative results must be combined with clinical observations, patient history, and epidemiological information. The expected result is Negative. Fact Sheet for Patients: SugarRoll.be Fact Sheet for Healthcare Providers: https://www.woods-mathews.com/ This test is not yet approved or cleared by the [place] FDA and  has been authorized for detection and/or diagnosis of SARS-CoV-2 by FDA under an Emergency Use Authorization (EUA). This EUA will remain  in effect (meaning this test can be used) for the duration of the COVID-19 declaration under Section 56 4(b)(1) of the Act, 21 U.S.C. section 360bbb-3(b)(1), unless the authorization is terminated or revoked sooner. Performed at [facility], [place] [address], [place], [place] [address]     Labs: BNP (last 3 results) Recent Labs    09/02/19 1727  BNP [id number]*   Basic Metabolic Panel: Recent Labs  Lab 09/02/19 1726 09/03/19 0437 09/04/19 0509 09/05/19 0723 09/05/19 0803  NA 137 136 138 136  --   K 3.3* 2.6* 4.0 5.0  --   CL 102 97* 98 98  --   CO2 26 28 29 27   --   GLUCOSE 104* 116* 104* 103*  --   BUN 9 9 11 15   --   CREATININE 0.83 0.95 1.07* 1.12*  --   CALCIUM 8.1* 7.9* 8.3* 8.5*  --   MG  --  1.3* 2.0  --  1.8  PHOS   --   --  3.3  --  3.9   Liver Function Tests: Recent Labs  Lab 09/02/19 1726 09/03/19 0437 09/04/19 0509  AST 25 20 19   ALT 14 13 14   ALKPHOS 101 91 78  BILITOT 1.0 0.8 0.8  PROT 6.8 6.4* 6.4*  ALBUMIN 2.9* 2.7* 2.7*   No results for input(s): LIPASE, AMYLASE in the last 168 hours. No results for input(s): AMMONIA in the last 168 hours. CBC: Recent Labs  Lab 09/02/19 1726 09/03/19 0437 09/04/19 0509 09/05/19 0803  WBC 11.4* 11.8* 7.2 12.2*  NEUTROABS 8.7*  --  3.6 8.0*  HGB 9.9* 9.5* 9.6* 10.4*  HCT 33.0* 30.8* 31.5* 34.8*  MCV 89.4 88.3 89.0 89.2  PLT 463* 431* 408* 445*   Cardiac Enzymes: No results for input(s): CKTOTAL, CKMB, CKMBINDEX, TROPONINI in the last 168 hours. BNP: Invalid input(s): POCBNP CBG: No results for input(s): GLUCAP in the last 168 hours. D-Dimer Recent Labs    09/02/19 1726  DDIMER 2.38*   Hgb A1c No results for input(s): HGBA1C in the last 72 hours. Lipid Profile No results for input(s): CHOL, HDL, LDLCALC, TRIG, CHOLHDL, LDLDIRECT in the last 72 hours. Thyroid function studies Recent Labs    09/05/19 0515  TSH 1.170   Anemia work up Recent Labs    09/05/19 0515  VITAMINB12 540  FOLATE 20.0  FERRITIN 48  TIBC 303  IRON 30  RETICCTPCT 1.8   Urinalysis    Component Value Date/Time   COLORURINE YELLOW 08/21/2019 1800   APPEARANCEUR TURBID (A) 08/21/2019 1800   LABSPEC 1.013 08/21/2019 1800   PHURINE 5.0 08/21/2019 1800   GLUCOSEU NEGATIVE 08/21/2019 1800   HGBUR MODERATE (A) 08/21/2019 1800   BILIRUBINUR NEGATIVE 08/21/2019 1800   KETONESUR NEGATIVE 08/21/2019 1800   PROTEINUR 100 (A) 08/21/2019 1800   UROBILINOGEN 0.2 07/03/2014 0313   NITRITE POSITIVE (A) 08/21/2019 1800   LEUKOCYTESUR LARGE (A) 08/21/2019 1800   Sepsis Labs Invalid input(s): PROCALCITONIN,  WBC,  LACTICIDVEN Microbiology Recent Results (from the past 240 hour(s))  SARS CORONAVIRUS 2 (TAT 6-24 HRS) Nasopharyngeal Nasopharyngeal Swab      Status: None   Collection Time: 09/02/19  6:59 PM   Specimen: Nasopharyngeal Swab  Result Value Ref Range Status   SARS Coronavirus 2 NEGATIVE NEGATIVE Final    Comment: (NOTE) SARS-CoV-2 target nucleic acids are NOT DETECTED. The SARS-CoV-2 RNA is generally detectable in upper and lower respiratory specimens during the acute phase of infection. Negative results do not preclude SARS-CoV-2 infection, do not rule out co-infections with other pathogens, and should not be used as the sole basis for treatment or other patient management decisions. Negative results must be combined with clinical observations, patient history, and epidemiological information. The expected result is Negative. Fact Sheet for Patients: SugarRoll.be Fact Sheet for Healthcare Providers: https://www.woods-mathews.com/ This test is not yet approved or cleared by the [place] FDA and  has been authorized for detection and/or diagnosis of SARS-CoV-2 by FDA under an Emergency Use Authorization (EUA). This EUA will remain  in effect (meaning this test can be used) for the duration of the COVID-19 declaration under Section 56 4(b)(1) of the Act, 21 U.S.C. section 360bbb-3(b)(1), unless the authorization is terminated or revoked sooner. Performed at [facility], [place] [address], [place], [place] [address]    Time coordinating discharge: 81 Minutes   SIGNED:  Kerney Elbe, DO [facility] 09/05/2019, 11:43 AM Pager is on [place]  If 7PM-7AM, please contact night-coverage [email or web address] Password [email or web address]

## 2019-09-05 NOTE — Progress Notes (Signed)
  Echocardiogram 2D Echocardiogram has been performed.  Jennette Dubin 09/05/2019, 9:26 AM

## 2019-09-05 NOTE — Progress Notes (Signed)
Physical Therapy Treatment Patient Details Name: [name] MRN: [id number] DOB: Dec 18, 1926 Today's Date: 09/05/2019    History of Present Illness 85 yo female admitted with CHF. Hx of hypothyroidism, anemia, RA, CKD, A fib, aortic stenosis, neuropathy    PT Comments    Pt assisted to [facility] due to urinary urgency (taking lasix).  Pt then ambulated in hallway.  SPO2 remained 88-90% on room air (see mobility section below).    Follow Up Recommendations  No PT follow up     Equipment Recommendations  None recommended by PT    Recommendations for Other Services       Precautions / Restrictions Precautions Precautions: Fall Restrictions Weight Bearing Restrictions: No    Mobility  Bed Mobility Overal bed mobility: Modified Independent                Transfers Overall transfer level: Needs assistance Equipment used: Rolling walker (2 wheeled) Transfers: Sit to/from [facility] Sit to Stand: Min guard Stand pivot transfers: Min guard          Ambulation/Gait Ambulation/Gait assistance: Min guard Gait Distance (Feet): 300 Feet Assistive device: Rolling walker (2 wheeled) Gait Pattern/deviations: Step-through pattern;Decreased stride length     General Gait Details: min/guard for safety, pt mobilizing well with RW, SPO2 reading 88-90% on room air however once back in room, continuous pulse ox reading 98%   Stairs             Wheelchair Mobility    Modified Rankin (Stroke Patients Only)       Balance                                            Cognition Arousal/Alertness: Awake/alert Behavior During Therapy: WFL for tasks assessed/performed Overall Cognitive Status: Within Functional Limits for tasks assessed                                        Exercises      General Comments        Pertinent Vitals/Pain Pain Assessment: No/denies pain Pain Intervention(s): Monitored during  session    Home Living                      Prior Function            PT Goals (current goals can now be found in the care plan section) Progress towards PT goals: Progressing toward goals    Frequency    Min 3X/week      PT Plan Current plan remains appropriate    Co-evaluation              AM-PAC PT "6 Clicks" Mobility   Outcome Measure  Help needed turning from your back to your side while in a flat bed without using bedrails?: A Little Help needed moving from lying on your back to sitting on the side of a flat bed without using bedrails?: A Little Help needed moving to and from a bed to a chair (including a wheelchair)?: A Little Help needed standing up from a chair using your arms (e.g., wheelchair or bedside chair)?: A Little Help needed to walk in hospital room?: A Little Help needed climbing 3-5 steps with a railing? : A Little 6 Click  Score: 18    End of Session   Activity Tolerance: Patient tolerated treatment well Patient left: Other (comment)(in bathroom, agreeable to use pull cord, nurse tech notified)   PT Visit Diagnosis: Other abnormalities of gait and mobility (R26.89);Muscle weakness (generalized) (M62.81)     Time: [id number] PT Time Calculation (min) (ACUTE ONLY): 19 min  Charges:  $Gait Training: 8-22 mins                     Carmelia Bake, PT, DPT Acute Rehabilitation Services Office: [phone] Pager: [phone]  Trena Platt 09/05/2019, 12:11 PM

## 2019-09-05 NOTE — Progress Notes (Signed)
Cardiology Progress Note  Patient ID: [name] MRN: [id number] DOB: Mar 27, 1927 Date of Encounter: 09/05/2019  Primary Cardiologist: Evalina Field, MD  Subjective  Started on p.o. Lasix today.  Blood pressure stable on metoprolol and Avapro.  Kidney function stable.  ROS:  All other ROS reviewed and negative. Pertinent positives noted in the HPI.     Inpatient Medications  Scheduled Meds: . aspirin  81 mg Oral Daily  . Chlorhexidine Gluconate Cloth  6 each Topical Daily  . enoxaparin (LOVENOX) injection  30 mg Subcutaneous Q24H  . furosemide  40 mg Oral Daily  . gabapentin  100 mg Oral QHS  . irbesartan  37.5 mg Oral Daily  . levothyroxine  75 mcg Oral Q0600  . metoprolol succinate  75 mg Oral Daily  . olopatadine  1 drop Both Eyes QID  . sodium chloride flush  3 mL Intravenous Q12H   Continuous Infusions: . sodium chloride 250 mL (09/03/19 0613)   PRN Meds: sodium chloride, acetaminophen **OR** acetaminophen, ALPRAZolam, sodium chloride flush   Vital Signs   Vitals:   09/05/19 0510 09/05/19 0900 09/05/19 0931 09/05/19 1100  BP: 131/81 (!) 117/103 119/64 (!) 105/56  Pulse: 91 (!) 103 75 79  Resp: 18   (!) 22  Temp: 98.7 F (37.1 C)     TempSrc:      SpO2: 96%   98%  Weight: 51.8 kg     Height:        Intake/Output Summary (Last 24 hours) at 09/05/2019 1333 Last data filed at 09/05/2019 0938 Gross per 24 hour  Intake 603 ml  Output 600 ml  Net 3 ml   Last 3 Weights 09/05/2019 09/04/2019 09/03/2019  Weight (lbs) 114 lb 3.2 oz 118 lb 13.3 oz 119 lb 0.8 oz  Weight (kg) 51.8 kg 53.9 kg 54 kg      Telemetry  Overnight telemetry shows atrial fibrillation with heart rate in the 80s, which I personally reviewed.   ECG  The most recent ECG shows atrial fibrillation, which I personally reviewed.   Physical Exam   Vitals:   09/05/19 0510 09/05/19 0900 09/05/19 0931 09/05/19 1100  BP: 131/81 (!) 117/103 119/64 (!) 105/56  Pulse: 91 (!) 103 75 79  Resp:  18   (!) 22  Temp: 98.7 F (37.1 C)     TempSrc:      SpO2: 96%   98%  Weight: 51.8 kg     Height:         Intake/Output Summary (Last 24 hours) at 09/05/2019 1333 Last data filed at 09/05/2019 0938 Gross per 24 hour  Intake 603 ml  Output 600 ml  Net 3 ml    Last 3 Weights 09/05/2019 09/04/2019 09/03/2019  Weight (lbs) 114 lb 3.2 oz 118 lb 13.3 oz 119 lb 0.8 oz  Weight (kg) 51.8 kg 53.9 kg 54 kg    Body mass index is 22.3 kg/m.  General: Well nourished, well developed, in no acute distress Head: Atraumatic, normal size  Eyes: PEERLA, EOMI  Neck: Supple, no JVD Endocrine: No thryomegaly Cardiac: Normal S1, S2; RRR; no murmurs, rubs, or gallops Lungs: Clear to auscultation bilaterally, no wheezing, rhonchi or rales  Abd: Soft, nontender, no hepatomegaly  Ext: No edema, pulses 2+ Musculoskeletal: No deformities, BUE and BLE strength normal and equal Skin: Warm and dry, no rashes   Neuro: Alert and oriented to person, place, time, and situation, CNII-XII grossly intact, no focal deficits  Psych: Normal mood  and affect   Labs  High Sensitivity Troponin:   Recent Labs  Lab 09/02/19 1726 09/02/19 2121 09/03/19 0437  TROPONINIHS 23* 25* 26*     Cardiac EnzymesNo results for input(s): TROPONINI in the last 168 hours. No results for input(s): TROPIPOC in the last 168 hours.  Chemistry Recent Labs  Lab 09/02/19 1726 09/03/19 0437 09/04/19 0509 09/05/19 0723  NA 137 136 138 136  K 3.3* 2.6* 4.0 5.0  CL 102 97* 98 98  CO2 26 28 29 27   GLUCOSE 104* 116* 104* 103*  BUN 9 9 11 15   CREATININE 0.83 0.95 1.07* 1.12*  CALCIUM 8.1* 7.9* 8.3* 8.5*  PROT 6.8 6.4* 6.4*  --   ALBUMIN 2.9* 2.7* 2.7*  --   AST 25 20 19   --   ALT 14 13 14   --   ALKPHOS 101 91 78  --   BILITOT 1.0 0.8 0.8  --   GFRNONAA >60 53* 46* 43*  GFRAA >60 >60 53* 50*  ANIONGAP 9 11 11 11     Hematology Recent Labs  Lab 09/03/19 0437 09/04/19 0509 09/05/19 0515 09/05/19 0803  WBC 11.8* 7.2  --  12.2*   RBC 3.49* 3.54* 3.87 3.90  HGB 9.5* 9.6*  --  10.4*  HCT 30.8* 31.5*  --  34.8*  MCV 88.3 89.0  --  89.2  MCH 27.2 27.1  --  26.7  MCHC 30.8 30.5  --  29.9*  RDW 15.4 15.5  --  15.6*  PLT 431* 408*  --  445*   BNP Recent Labs  Lab 09/02/19 1727  BNP 1,352.1*    DDimer  Recent Labs  Lab 09/02/19 1726  DDIMER 2.38*     Radiology  No results found.  Cardiac Studies  TTE 09/03/2019  1. Left ventricular ejection fraction, by visual estimation, is 35-40%. There is mildly increased left ventricular hypertrophy of the basal septum.  2. There is akinesis of the apical septal, apical, apical inferior , mid anteroseptal and apical anterior walls.  3. Left ventricular diastolic function could not be evaluated due to underlying atrial fibrillation.  4. Elevated left ventricular end-diastolic pressure.  5. Global right ventricle has mildly reduced systolic function.The right ventricular size is normal. No increase in right ventricular wall thickness.  6. Left atrial size was moderately dilated.  7. Right atrial size was moderately dilated.  8. The mitral valve is normal in structure. Trace mitral valve regurgitation. No evidence of mitral stenosis.  9. The tricuspid valve is normal in structure. Tricuspid valve regurgitation is mild. 10. The aortic valve is tricuspid. There is Severely thickening of the aortic valve. There is Severe calcifcation of the aortic valve. Aortic valve regurgitation is trivial. No evidence of aortic valve sclerosis or stenosis. The peak velocity and mean  gradient are c/w mild AS but these are likely underestimated in the setting of severe LV dysfunction. The AVA is calculated at 0.42 cm and dimensionless index 0.27. Visually valve appears at least moderate to severly stenotic. 11. The pulmonic valve was normal in structure. Pulmonic valve regurgitation is trivial. 12. Mildly elevated pulmonary artery systolic pressure. 13. The inferior vena cava is normal in  size with greater than 50% respiratory variability, suggesting right atrial pressure of 3 mmHg. 14. Trivial pericardial effusion is present. 15. The pericardial effusion is localized near the right atrium. 16. Cannot rule out apical thrombus. Recommend limited study with definity contrast.  Patient Profile  Ms. Diguglielmo is a 85 year old female with  history of permanent atrial fibrillation, hypertension, hyperlipidemia, aortic stenosis who was admitted with shortness of breath and subsequently diagnosed with acute systolic heart failure and likely severe low-flow low gradient aortic stenosis.    Assessment & Plan   1.  Likely low-flow low gradient severe aortic stenosis/newly diagnosed congestive heart failure, ejection fraction 35 to 40% with regional wall motion normalities -Her echocardiogram shows severely calcified with restricted movement of the aortic valve.  I do suspect this is likely low flow low gradient severe aortic stenosis.  Her ejection fraction is reduced with concerns for LAD infarction in the past.  However she has no evidence of Q waves on her EKG.  This could represent a Takotsubo cardiomyopathy. -Nonetheless given her age and her desire for less aggressive therapy at this time, I think we will continue her metoprolol succinate 75 mg daily as well as her Avapro 37.5 mg daily.  We will discharge her with 40 mg p.o. Lasix daily. -We will arrange follow-up for her to see me in clinic as well as for her to be evaluated by structural heart team for potential TAVR.  A left heart cath will be part that work-up and I think this is warranted given the cardiomyopathy and the regional wall motion normalities.  2.  Permanent atrial fibrillation -Rate control with metoprolol -She has refused anticoagulation and I am okay with this  [facility] will sign off.   Medication Recommendations: Medications as above Other recommendations (labs, testing, etc): We will follow her as an outpatient  Follow up as an outpatient: We will arrange follow-up appointments  For questions or updates, please contact [place] Please consult [email or web address] for contact info under        Signed, [place] Bells T. Audie Box, [place]  09/05/2019 1:33 PM

## 2019-09-05 NOTE — Care Management Important Message (Signed)
Important Message IM Letter given to [name] to present to the Patient Patient DetailsName: [name] MRN: [id number] Date of Birth: Oct 15, 1927   Medicare Important Message Given:  Yes     Kerin Salen 09/05/2019, 11:16 AM

## 2019-09-05 NOTE — Progress Notes (Signed)
Pt had 8 beats wide QRS. BP 105/56, HR 79, 98% on RA. Asymptomatic. MD notified.

## 2019-09-05 NOTE — Discharge Instructions (Signed)

## 2019-09-08 ENCOUNTER — Telehealth: Payer: Self-pay | Admitting: Nurse Practitioner

## 2019-09-08 ENCOUNTER — Telehealth: Payer: Self-pay | Admitting: *Deleted

## 2019-09-08 NOTE — Telephone Encounter (Signed)
Unable to reach pt. Pt has follow up scheduled for 09/12/19.

## 2019-09-08 NOTE — Telephone Encounter (Signed)
Please help call and offer [facility] follow up for the pt.   Copied from [place] [id number]. Topic: General - Other >> Sep 08, 2019 10:27 AM Keene Breath wrote: Reason for CRM: Patient called to ask the nurse or doctor to call her because she just got out of the hospital and is not sure if she needs to come in or if she can have a virtual appt.  Please call to discuss at [phone]

## 2019-09-09 ENCOUNTER — Telehealth: Payer: Medicare Other | Admitting: Nurse Practitioner

## 2019-09-09 NOTE — Telephone Encounter (Signed)
Unable to reach pt x2

## 2019-09-10 NOTE — Telephone Encounter (Signed)
Unable to reach pt x3. Pt has appt 09/12/19

## 2019-09-11 ENCOUNTER — Ambulatory Visit: Payer: Medicare Other | Admitting: Cardiovascular Disease

## 2019-09-12 ENCOUNTER — Encounter: Payer: Self-pay | Admitting: Nurse Practitioner

## 2019-09-12 ENCOUNTER — Ambulatory Visit (INDEPENDENT_AMBULATORY_CARE_PROVIDER_SITE_OTHER): Payer: Medicare Other | Admitting: Nurse Practitioner

## 2019-09-12 ENCOUNTER — Ambulatory Visit (INDEPENDENT_AMBULATORY_CARE_PROVIDER_SITE_OTHER): Payer: Medicare Other

## 2019-09-12 ENCOUNTER — Ambulatory Visit: Payer: Medicare Other | Admitting: Cardiovascular Disease

## 2019-09-12 ENCOUNTER — Other Ambulatory Visit: Payer: Self-pay

## 2019-09-12 VITALS — BP 110/70 | HR 64 | Temp 96.5°F | Ht 60.0 in | Wt 111.0 lb

## 2019-09-12 DIAGNOSIS — N182 Chronic kidney disease, stage 2 (mild): Secondary | ICD-10-CM | POA: Diagnosis not present

## 2019-09-12 DIAGNOSIS — I5041 Acute combined systolic (congestive) and diastolic (congestive) heart failure: Secondary | ICD-10-CM | POA: Diagnosis not present

## 2019-09-12 DIAGNOSIS — I4821 Permanent atrial fibrillation: Secondary | ICD-10-CM | POA: Diagnosis not present

## 2019-09-12 DIAGNOSIS — R8290 Unspecified abnormal findings in urine: Secondary | ICD-10-CM | POA: Diagnosis not present

## 2019-09-12 DIAGNOSIS — N390 Urinary tract infection, site not specified: Secondary | ICD-10-CM

## 2019-09-12 DIAGNOSIS — I482 Chronic atrial fibrillation, unspecified: Secondary | ICD-10-CM | POA: Diagnosis not present

## 2019-09-12 DIAGNOSIS — J90 Pleural effusion, not elsewhere classified: Secondary | ICD-10-CM | POA: Diagnosis not present

## 2019-09-12 DIAGNOSIS — N1832 Chronic kidney disease, stage 3b: Secondary | ICD-10-CM

## 2019-09-12 DIAGNOSIS — N321 Vesicointestinal fistula: Secondary | ICD-10-CM | POA: Diagnosis not present

## 2019-09-12 NOTE — Progress Notes (Signed)
Subjective:  Patient ID: [name], female    DOB: December 29, 1926  Age: 85 y.o. MRN: [id number]  CC: Hospitalization Follow-up (hospital follow up/furosemine and irbesartan consult?)  HPI Accompanied by Her son-[name] Hospitalization:11/03-11/03/2019 Reviewed discharge summary.  Ms. Luebbe present to [facility] with SOB and cough. She was diagnosed with acute CHF exacerbation. This is secondary to severe calcified aortic valve with restricted movement. With elevated Ddimer, CTA  Or VQ scan was recommended, but she declined. She was diuresed, her medications adjusted and discharged home with her son Hoover Browns) She also had recent hospitalization due to urosepsis secondary to colovesicula fistula. During hospitalization she was treated with addition oral Iv abx. She was afebrile during discharge, so no oral abx was given. I reviewed lab results and echocardiogram report from recent hospitalization.  Today she denies any SOB or dizziness or fever or ABD pain or nausea, or chest pain or palpitations. She did not maintain appt with cardiology. She has not scheduled f/up with urology either. She states she is aware of the need for valve replacement but she is very concerned about possible respiratory failure and death with general anesthesia. She is like additional explanation of the procedure, before making a final decision.  Medication list reconciled with ms. Justice Rocher and her son. Reviewed past Medical, and Social history today.  Outpatient Medications Prior to Visit  Medication Sig Dispense Refill  . acetaminophen (TYLENOL) 500 MG tablet Take 1,000 mg by mouth every 8 (eight) hours as needed (joint pain).     Marland Kitchen ALPRAZolam (XANAX) 0.25 MG tablet Take 1 tablet (0.25 mg total) by mouth daily as needed for anxiety. 30 tablet 2  . aspirin 81 MG chewable tablet Chew 1 tablet (81 mg total) by mouth daily. 30 tablet 0  . gabapentin (NEURONTIN) 100 MG capsule Take 1 capsule (100 mg total) by mouth at  bedtime. 90 capsule 3  . irbesartan (AVAPRO) 75 MG tablet Take 0.5 tablets (37.5 mg total) by mouth daily. 30 tablet 0  . levothyroxine (SYNTHROID) 75 MCG tablet Take 1 tablet (75 mcg total) by mouth daily before breakfast. 90 tablet 1  . metoprolol succinate (TOPROL-XL) 25 MG 24 hr tablet Take 3 tablets (75 mg total) by mouth daily. 90 tablet 11  . Multiple Vitamin (MULTIVITAMIN WITH MINERALS) TABS tablet Take 1 tablet by mouth daily. 30 tablet 0  . Olopatadine HCl 0.2 % SOLN Place 1 drop into both eyes daily. (Patient taking differently: Place 1 drop into both eyes 4 (four) times daily. ) 2.5 mL 2  . furosemide (LASIX) 40 MG tablet Take 1 tablet (40 mg total) by mouth daily. 30 tablet 0   No facility-administered medications prior to visit.    ROS See HPI  Objective:  BP 110/70   Pulse 64   Temp (!) 96.5 F (35.8 C) (Tympanic)   Ht 5' (1.524 m)   Wt 111 lb (50.3 kg)   SpO2 100%   BMI 21.68 kg/m   BP Readings from Last 3 Encounters:  09/12/19 110/70  09/05/19 131/73  08/24/19 122/64   Wt Readings from Last 3 Encounters:  09/12/19 111 lb (50.3 kg)  09/05/19 114 lb 3.2 oz (51.8 kg)  08/21/19 105 lb (47.6 kg)   Physical Exam Vitals signs reviewed.  Constitutional:      General: She is not in acute distress.    Appearance: She is not toxic-appearing or diaphoretic.  Cardiovascular:     Rate and Rhythm: Normal rate. Rhythm irregular.  Pulses: Normal pulses.     Heart sounds: Murmur present.  Pulmonary:     Effort: Pulmonary effort is normal.     Breath sounds: Normal breath sounds.  Musculoskeletal:     Right lower leg: Edema present.     Left lower leg: Edema present.  Neurological:     Mental Status: She is alert and oriented to person, place, and time.  Psychiatric:        Mood and Affect: Mood normal.        Behavior: Behavior normal.     Lab Results  Component Value Date   WBC 9.9 09/12/2019   HGB 11.0 (L) 09/12/2019   HCT 32.2 (L) 09/12/2019   PLT  434 (H) 09/12/2019   GLUCOSE 109 (H) 09/12/2019   CHOL 155 07/03/2019   TRIG 143 07/03/2019   HDL 51 07/03/2019   LDLDIRECT 146.0 07/29/2015   LDLCALC 80 07/03/2019   ALT 11 09/12/2019   AST 22 09/12/2019   NA 135 09/12/2019   K 4.7 09/12/2019   CL 94 (L) 09/12/2019   CREATININE 1.38 (H) 09/12/2019   BUN 34 (H) 09/12/2019   CO2 28 09/12/2019   TSH 1.170 09/05/2019   INR 1.1 08/21/2019   HGBA1C 5.8 (H) 07/03/2019    Dg Chest Port 1 View  Result Date: 09/02/2019 CLINICAL DATA:  Shortness of breath for 1 week EXAM: PORTABLE CHEST 1 VIEW COMPARISON:  Radiograph 08/24/2019 FINDINGS: Increasing hazy interstitial opacities throughout the lungs with cephalized, indistinct pulmonary vascularity and some septal thickening in the lung periphery. Hazy opacity obscures both hemidiaphragms suggesting a bilateral pleural effusions. Cardiomegaly is similar to comparison though portions of the cardiac borders are obscured by overlying opacity. The aorta is calcified. No pneumothorax. No acute osseous or soft tissue abnormality. Known hiatal hernia is poorly visualized. IMPRESSION: Features suggesting CHF/volume overload with cardiomegaly, interstitial edema and bilateral effusions. Aortic Atherosclerosis (ICD10-I70.0). Electronically Signed   By: Lovena Le M.D.   On: 09/02/2019 18:07    Assessment & Plan:   Lindaann was seen today for hospitalization follow-up.  Diagnoses and all orders for this visit:  Acute combined systolic and diastolic congestive heart failure ([place]) -     CXR -     Ambulatory referral to Nephrology -     furosemide (LASIX) 20 MG tablet; Take 1 tablet (20 mg total) by mouth daily.  Recurrent UTI  Colovesical fistula -     Urinalysis w microscopic + reflex cultur -     Urine Culture -     REFLEXIVE URINE CULTURE  Stage 3b chronic kidney disease -     CBC -     CMP -     Phosphorus -     Ambulatory referral to Nephrology -     furosemide (LASIX) 20 MG tablet; Take 1  tablet (20 mg total) by mouth daily. -     HTN_1 BMP; Future -     Phosphorus; Future  Atrial fibrillation, chronic -     CBC -     CMP -     Magnesium   I have changed Deloria Lair. Seelig's furosemide. I am also having her maintain her acetaminophen, multivitamin with minerals, aspirin, Olopatadine HCl, metoprolol succinate, gabapentin, levothyroxine, ALPRAZolam, and irbesartan.  Meds ordered this encounter  Medications  . furosemide (LASIX) 20 MG tablet    Sig: Take 1 tablet (20 mg total) by mouth daily.    Dispense:  14 tablet    Refill:  0    Order Specific Question:   Supervising Provider    Answer:   MATTHEWS, CODY [[id number]]    Problem List Items Addressed This Visit      Cardiovascular and Mediastinum   Acute CHF (congestive heart failure) ([place]) - Primary    Echocardiogram done 09/05/2019: 1. Left ventricular ejection fraction, by visual estimation, is 35 to 40%. The left ventricle has moderately decreased function. There is mildly increased left ventricular hypertrophy.  2. Mid and apical anterior wall, mid and apical anterior septum, and apex are abnormal.  3. No apical thrombus seen  4. Definity contrast agent was given IV to delineate the left ventricular endocardial borders.  Clear lungs and no LE edema Stable Vital signs. Reports compliance with medications. Repeat CXR today: cardiomegaly, no pleural effusion. Advised Ms. Shone and [name] to schedule f/up with cardiology ASAP. Repeat BMP indicates worsening renal function, so decrease furosemide to 20mg . Repeat BMP in 2weeks. Maintain metoprolol XL and irbesartan.       Relevant Medications   furosemide (LASIX) 20 MG tablet   Other Relevant Orders   CXR (Completed)   Ambulatory referral to Nephrology   Atrial fibrillation, chronic    Rate controlled with metoprolol XL 75mg . Severe aortic stenosis present She is very hesitant about any surgical intervention.      Relevant Medications   furosemide (LASIX)  20 MG tablet   Other Relevant Orders   CBC (Completed)   CMP (Completed)   Magnesium (Completed)     Digestive   Colovesical fistula   Relevant Orders   Urinalysis w microscopic + reflex cultur (Completed)   Urine Culture (Completed)   REFLEXIVE URINE CULTURE (Completed)     Genitourinary   Chronic kidney disease (CKD), stage III (moderate)    Normal potassium and sodium Elevated phosphorus Decrease furosemide to 20mg  Entered referral to nephrology.      Relevant Medications   furosemide (LASIX) 20 MG tablet   Other Relevant Orders   CBC (Completed)   CMP (Completed)   Phosphorus (Completed)   Ambulatory referral to Nephrology   HTN_1 BMP   Phosphorus   UTI (urinary tract infection)      Follow-up: Return in about 3 months (around 12/13/2019) for hypothyroidism.  Wilfred Lacy, NP

## 2019-09-12 NOTE — Patient Instructions (Addendum)
Please schedule f/up appt with cardiology and urology ASAP.  Abnormal urinalysis, urine culture pending. Stable cbc Normal magnesium Worsening renal function and phosphorus. Decrease furosemide to 20mg  daily. I also entered referral to nephrology Return to lab in 2weeks for repeat BMP and phosphorus

## 2019-09-13 MED ORDER — FUROSEMIDE 20 MG PO TABS: 20.0000 mg | ORAL_TABLET | Freq: Every day | ORAL | 0 refills | Status: DC

## 2019-09-14 LAB — COMPREHENSIVE METABOLIC PANEL
AG Ratio: 1 (calc) (ref 1.0–2.5)
ALT: 11 U/L (ref 6–29)
AST: 22 U/L (ref 10–35)
Albumin: 4 g/dL (ref 3.6–5.1)
Alkaline phosphatase (APISO): 104 U/L (ref 37–153)
BUN/Creatinine Ratio: 25 (calc) — ABNORMAL HIGH (ref 6–22)
BUN: 34 mg/dL — ABNORMAL HIGH (ref 7–25)
CO2: 28 mmol/L (ref 20–32)
Calcium: 9.6 mg/dL (ref 8.6–10.4)
Chloride: 94 mmol/L — ABNORMAL LOW (ref 98–110)
Creat: 1.38 mg/dL — ABNORMAL HIGH (ref 0.60–0.88)
Globulin: 4.1 g/dL (calc) — ABNORMAL HIGH (ref 1.9–3.7)
Glucose, Bld: 109 mg/dL — ABNORMAL HIGH (ref 65–99)
Potassium: 4.7 mmol/L (ref 3.5–5.3)
Sodium: 135 mmol/L (ref 135–146)
Total Bilirubin: 0.6 mg/dL (ref 0.2–1.2)
Total Protein: 8.1 g/dL (ref 6.1–8.1)

## 2019-09-14 LAB — CBC
HCT: 32.2 % — ABNORMAL LOW (ref 35.0–45.0)
Hemoglobin: 11 g/dL — ABNORMAL LOW (ref 11.7–15.5)
MCH: 28.4 pg (ref 27.0–33.0)
MCHC: 34.2 g/dL (ref 32.0–36.0)
MCV: 83.2 fL (ref 80.0–100.0)
MPV: 9.2 fL (ref 7.5–12.5)
Platelets: 434 10*3/uL — ABNORMAL HIGH (ref 140–400)
RBC: 3.87 10*6/uL (ref 3.80–5.10)
RDW: 14.4 % (ref 11.0–15.0)
WBC: 9.9 10*3/uL (ref 3.8–10.8)

## 2019-09-14 LAB — URINE CULTURE
MICRO NUMBER:: 1101462
SPECIMEN QUALITY:: ADEQUATE

## 2019-09-14 LAB — URINALYSIS W MICROSCOPIC + REFLEX CULTURE
Bacteria, UA: NONE SEEN /HPF
Bilirubin Urine: NEGATIVE
Glucose, UA: NEGATIVE
Hyaline Cast: NONE SEEN /LPF
Ketones, ur: NEGATIVE
Nitrites, Initial: NEGATIVE
Specific Gravity, Urine: 1.008 (ref 1.001–1.03)
Squamous Epithelial / HPF: NONE SEEN /HPF (ref <=5)
WBC, UA: >=60 /HPF — AB (ref 0–5)
pH: 6.5 (ref 5.0–8.0)

## 2019-09-14 LAB — PHOSPHORUS: Phosphorus: 4.7 mg/dL — ABNORMAL HIGH (ref 2.1–4.3)

## 2019-09-14 LAB — MAGNESIUM: Magnesium: 1.9 mg/dL (ref 1.5–2.5)

## 2019-09-14 LAB — CULTURE INDICATED

## 2019-09-16 ENCOUNTER — Encounter: Payer: Self-pay | Admitting: Nurse Practitioner

## 2019-09-16 DIAGNOSIS — I700 Atherosclerosis of aorta: Secondary | ICD-10-CM | POA: Insufficient documentation

## 2019-09-16 NOTE — Assessment & Plan Note (Addendum)
Rate controlled with metoprolol XL 75mg . Severe aortic stenosis present She is very hesitant about any surgical intervention.

## 2019-09-16 NOTE — Assessment & Plan Note (Addendum)
Echocardiogram done 09/05/2019: 1. Left ventricular ejection fraction, by visual estimation, is 35 to 40%. The left ventricle has moderately decreased function. There is mildly increased left ventricular hypertrophy.  2. Mid and apical anterior wall, mid and apical anterior septum, and apex are abnormal.  3. No apical thrombus seen  4. Definity contrast agent was given IV to delineate the left ventricular endocardial borders.  Clear lungs and no LE edema Stable Vital signs. Reports compliance with medications. Repeat CXR today: cardiomegaly, no pleural effusion. Advised Ms. Magadan and [name] to schedule f/up with cardiology ASAP. Repeat BMP indicates worsening renal function, so decrease furosemide to 20mg . Repeat BMP in 2weeks. Maintain metoprolol XL and irbesartan.

## 2019-09-16 NOTE — Assessment & Plan Note (Signed)
Normal potassium and sodium Elevated phosphorus Decrease furosemide to 20mg  Entered referral to nephrology.

## 2019-09-16 NOTE — Assessment & Plan Note (Deleted)
Rate controlled with metoprolol XL 75mg  daily.

## 2019-09-30 ENCOUNTER — Telehealth: Payer: Self-pay

## 2019-09-30 NOTE — Telephone Encounter (Signed)
PN-Pt called stating RTC to you/I advised that I would get you the message as I cannot find a reason for call/thx dmf

## 2019-09-30 NOTE — Telephone Encounter (Signed)
Pt need to get lab work done--pt stated she going to call us back to schedule lab appt once she know her schedule.

## 2019-10-01 ENCOUNTER — Telehealth: Payer: Self-pay | Admitting: Nurse Practitioner

## 2019-10-01 DIAGNOSIS — N182 Chronic kidney disease, stage 2 (mild): Secondary | ICD-10-CM

## 2019-10-01 DIAGNOSIS — I10 Essential (primary) hypertension: Secondary | ICD-10-CM

## 2019-10-01 NOTE — Telephone Encounter (Signed)
Spoke with the pt's son [name]) and he is aware of message.

## 2019-10-01 NOTE — Telephone Encounter (Signed)
I spoke with patient and advised her to discontinue Avapro per Dr.Goldsborough. Take BP daily  keep a record of the readings and send via mychart in a week. If BP > 140/80 infrom pcp and she'll send in amlodipine RX. Patient said she would call to schedule lab visit when she has free time to come in.

## 2019-10-01 NOTE — Telephone Encounter (Signed)
Please inform [name] that Dr. Moshe Cipro with [facility] recommended for Avapro to be discontinued in order to improve renal function She should stop this medication, monitor BP daily and record, send BP readings via mychart in 1week. If BP is >140/80, I will need to send amlodipine RX I will also like for her to go to lab for repeat BMP in 27month.

## 2019-10-01 NOTE — Telephone Encounter (Signed)
LVM for the pt to call back.

## 2019-10-01 NOTE — Telephone Encounter (Signed)
Pt son called and stated that pt did not really understand message. [name] would like a call back from the nurse for further explanation.

## 2019-10-03 ENCOUNTER — Ambulatory Visit: Payer: Medicare Other | Admitting: Cardiovascular Disease

## 2019-10-06 ENCOUNTER — Ambulatory Visit: Payer: Medicare Other | Admitting: Cardiovascular Disease

## 2019-10-09 ENCOUNTER — Telehealth: Payer: Self-pay | Admitting: Nurse Practitioner

## 2019-10-09 NOTE — Telephone Encounter (Signed)
Pt called to report that her BP on 12/2/--128/73, 12/3/--119/75, 12/7--124/76 and 12/8/--119/77--.   Pt also mention that cardiology wants to replace her heart valve but pt denied   [name]--   Copied from [place]. Topic: General - Other >> Oct 08, 2019 11:49 AM [name] wrote: Patient requesting call back from [facility]- Patient would not disclose any other information.

## 2019-10-09 NOTE — Telephone Encounter (Signed)
Thank you for BP readings. I still need her to go to lab for blood draw

## 2019-10-09 NOTE — Telephone Encounter (Signed)
Pt stated she going to call next Monday or Tuesday to schedule lab appt.

## 2019-10-13 ENCOUNTER — Telehealth: Payer: Self-pay | Admitting: Nurse Practitioner

## 2019-10-13 NOTE — Telephone Encounter (Signed)
LVM for the pt to call back.

## 2019-10-13 NOTE — Telephone Encounter (Signed)
Spoke with the pt and advise her to take lasix 20 mg as PCP directed (pt stated she is taking 20 mg). Pt mention that she is SOB on exertion at times but better when rest. Pt thinking this might be coming from her medications (not sure which one). [name] please advise.

## 2019-10-13 NOTE — Telephone Encounter (Signed)
Patient is calling to discuss her medications. Patient feels that since [place] removed some of her medications she is not breathing well. Please advise [phone]

## 2019-10-14 ENCOUNTER — Other Ambulatory Visit: Payer: Self-pay

## 2019-10-14 ENCOUNTER — Other Ambulatory Visit (INDEPENDENT_AMBULATORY_CARE_PROVIDER_SITE_OTHER): Payer: Medicare Other

## 2019-10-14 DIAGNOSIS — N182 Chronic kidney disease, stage 2 (mild): Secondary | ICD-10-CM

## 2019-10-14 DIAGNOSIS — N1832 Chronic kidney disease, stage 3b: Secondary | ICD-10-CM

## 2019-10-14 DIAGNOSIS — I10 Essential (primary) hypertension: Secondary | ICD-10-CM

## 2019-10-14 LAB — BASIC METABOLIC PANEL
BUN: 33 mg/dL — ABNORMAL HIGH (ref 6–23)
CO2: 29 mEq/L (ref 19–32)
Calcium: 9.7 mg/dL (ref 8.4–10.5)
Chloride: 100 mEq/L (ref 96–112)
Creatinine, Ser: 1.38 mg/dL — ABNORMAL HIGH (ref 0.40–1.20)
GFR: 35.89 mL/min — ABNORMAL LOW (ref >60.00)
Glucose, Bld: 107 mg/dL — ABNORMAL HIGH (ref 70–99)
Potassium: 4.8 mEq/L (ref 3.5–5.1)
Sodium: 137 mEq/L (ref 135–145)

## 2019-10-14 LAB — RENAL FUNCTION PANEL
Albumin: 3.9 g/dL (ref 3.5–5.2)
BUN: 33 mg/dL — ABNORMAL HIGH (ref 6–23)
CO2: 29 mEq/L (ref 19–32)
Calcium: 9.7 mg/dL (ref 8.4–10.5)
Chloride: 100 mEq/L (ref 96–112)
Creatinine, Ser: 1.38 mg/dL — ABNORMAL HIGH (ref 0.40–1.20)
GFR: 35.89 mL/min — ABNORMAL LOW (ref >60.00)
Glucose, Bld: 107 mg/dL — ABNORMAL HIGH (ref 70–99)
Phosphorus: 4.7 mg/dL — ABNORMAL HIGH (ref 2.3–4.6)
Potassium: 4.8 mEq/L (ref 3.5–5.1)
Sodium: 137 mEq/L (ref 135–145)

## 2019-10-14 LAB — PHOSPHORUS: Phosphorus: 4.7 mg/dL — ABNORMAL HIGH (ref 2.3–4.6)

## 2019-10-14 NOTE — Telephone Encounter (Signed)
Lasix dose was decreased due to worsening renal function. She was also instructed several times to return to lab for repeat lab. I also instructed her to schedule appt with cardiology for CHF management. She needs to schedule F2F appt with me today or contact cardiology for appt.

## 2019-10-14 NOTE — Progress Notes (Signed)
la 

## 2019-10-14 NOTE — Telephone Encounter (Signed)
Spoke with the pt, she is aware of your message below. Pt stated she does not have SOB at the moment but if its get worse she will call PCP,cardilogy or ED. Pt denied appt for today with [name] but she will come for lab work at 2:40pm.

## 2019-10-16 ENCOUNTER — Telehealth: Payer: Self-pay

## 2019-10-16 NOTE — Telephone Encounter (Signed)
  [facility]   MULTIDISCIPLINARY HEART VALVE TEAM  Pt has been hesitant to come in for TAVR Consult.  Most recent apt on 12/7 with Dr Burt Knack was cancelled by the pt due to illness (11/12 apt, pt cancelled TAVR eval with Dr Angelena Form).  I left the pt a message to contact me to discuss rescheduling this appointment if she is interested in discussing TAVR with our structural heart team.

## 2019-10-21 ENCOUNTER — Other Ambulatory Visit: Payer: Self-pay | Admitting: Nurse Practitioner

## 2019-10-21 DIAGNOSIS — F418 Other specified anxiety disorders: Secondary | ICD-10-CM

## 2019-10-21 DIAGNOSIS — E039 Hypothyroidism, unspecified: Secondary | ICD-10-CM

## 2019-10-21 DIAGNOSIS — I5041 Acute combined systolic (congestive) and diastolic (congestive) heart failure: Secondary | ICD-10-CM

## 2019-10-21 DIAGNOSIS — N1832 Chronic kidney disease, stage 3b: Secondary | ICD-10-CM

## 2019-10-21 NOTE — Telephone Encounter (Signed)
[name] please advise, last ov was 09/12/2019--pt scheduled to see you in 12/12/2019. Do you want to wait and address the refill then?  Lasix last refill sent in 07/26/2019 for 14 tabs Levothyroxine last refill sent 07/05/19--for 6 mo supply Xanax--last refill sent 07/26/19 for 30 tab with 2 refills--PMP check  09/26/19--30 tab at [facility] 08/27/19--30 tab at [facility] 07/26/19--30 tab at [facility].

## 2019-11-10 LAB — HM DIABETES EYE EXAM

## 2019-11-26 ENCOUNTER — Encounter: Payer: Self-pay | Admitting: Nurse Practitioner

## 2019-11-26 ENCOUNTER — Other Ambulatory Visit: Payer: Self-pay | Admitting: Nurse Practitioner

## 2019-11-26 DIAGNOSIS — N1832 Chronic kidney disease, stage 3b: Secondary | ICD-10-CM

## 2019-11-26 DIAGNOSIS — I5041 Acute combined systolic (congestive) and diastolic (congestive) heart failure: Secondary | ICD-10-CM

## 2019-11-26 DIAGNOSIS — F418 Other specified anxiety disorders: Secondary | ICD-10-CM

## 2019-11-26 NOTE — Progress Notes (Signed)
Abstracted result and sent to scan  

## 2019-11-26 NOTE — Telephone Encounter (Signed)
[name] please advise on both med:  Last ov for anxiety was 11/2018 but had ov with you 09/12/2019.  Last refill: 10/21/2019--30 tab  MPM checked last 3 pick up from [place] for 30 tab.   10/28/2019 09/26/2019 08/27/2019

## 2019-11-27 ENCOUNTER — Telehealth: Payer: Self-pay | Admitting: Nurse Practitioner

## 2019-11-27 NOTE — Telephone Encounter (Signed)
Pt stated she declined appt for both.   Pt stated she does not want to do test that nephrology going to do Pt stated that does not wait to wast cardiology time and her times

## 2019-11-27 NOTE — Telephone Encounter (Signed)
Inquire if she made appt with nephrology? I see she did not make appt with cardiology either. Furosemide refill was declined due to lack of f/up with nephrology and cardiology.

## 2019-12-12 ENCOUNTER — Other Ambulatory Visit: Payer: Self-pay

## 2019-12-12 ENCOUNTER — Encounter: Payer: Self-pay | Admitting: Nurse Practitioner

## 2019-12-12 ENCOUNTER — Ambulatory Visit (INDEPENDENT_AMBULATORY_CARE_PROVIDER_SITE_OTHER): Payer: Medicare Other | Admitting: Nurse Practitioner

## 2019-12-12 VITALS — BP 105/66 | Ht 60.0 in | Wt 130.0 lb

## 2019-12-12 DIAGNOSIS — N1832 Chronic kidney disease, stage 3b: Secondary | ICD-10-CM | POA: Diagnosis not present

## 2019-12-12 DIAGNOSIS — I10 Essential (primary) hypertension: Secondary | ICD-10-CM

## 2019-12-12 DIAGNOSIS — E039 Hypothyroidism, unspecified: Secondary | ICD-10-CM | POA: Diagnosis not present

## 2019-12-12 NOTE — Patient Instructions (Signed)
Call office and schedule lab appt and f/up appt (F2F). Schedule appt with nephrology ASAP, number and address provided to patient and son.

## 2019-12-12 NOTE — Progress Notes (Signed)
Virtual Visit via Telephone Note  I connected with@ on 12/18/19 at  2:00 PM EST by enabled telemedicine application and verified that I am speaking with the correct person using two identifiers.  Location: Patient:Home Provider: Office Participants: patient, son Careers adviser) and provider  I discussed the limitations of evaluation and management by telemedicine and the availability of in person appointments. I also discussed with the patient that there may be a patient responsible charge related to this service. The patient expressed understanding and agreed to proceed.  CC:HTN, hypothyroidism f/up  History of Present Illness: Ms. Kisner is at home with her son-[name]. We are following up on HTN, HKD and hypothyroidism. She has not made appt with neprhologist as previously discussed. She did not maintain her appt with cardiology either. No CP, no LE edema, no palpitations, no SOB or PND, no ABD distension, no fever. [name] states diarrhea and nausea have resolved BP and weight are stable. BP Readings from Last 3 Encounters:  12/12/19 105/66  09/12/19 110/70  09/05/19 131/73   Wt Readings from Last 3 Encounters:  12/12/19 130 lb (59 kg)  09/12/19 111 lb (50.3 kg)  09/05/19 114 lb 3.2 oz (51.8 kg)   Observations/Objective: Alert and oriented x3, normal speech. Limited exam due to telephone call  Assessment and Plan: Payden was seen today for follow-up.  Diagnoses and all orders for this visit:  Essential hypertension -     Basic metabolic panel; Future -     Phosphorus; Future  Hypothyroidism, unspecified type -     TSH; Future -     T4, free; Future  Stage 3b chronic kidney disease -     Basic metabolic panel; Future -     Phosphorus; Future -     CBC; Future   Follow Up Instructions: See avs   I discussed the assessment and treatment plan with the patient. The patient was provided an opportunity to ask questions and all were answered. The patient agreed with the  plan and demonstrated an understanding of the instructions.   The patient was advised to call back or seek an in-person evaluation if the symptoms worsen or if the condition fails to improve as anticipated.  I provided 14 minutes of non-face-to-face time during this encounter.   Wilfred Lacy, NP

## 2019-12-28 ENCOUNTER — Other Ambulatory Visit: Payer: Self-pay | Admitting: Nurse Practitioner

## 2019-12-28 DIAGNOSIS — F418 Other specified anxiety disorders: Secondary | ICD-10-CM

## 2020-01-21 DIAGNOSIS — N3020 Other chronic cystitis without hematuria: Secondary | ICD-10-CM | POA: Diagnosis not present

## 2020-01-21 DIAGNOSIS — N1330 Unspecified hydronephrosis: Secondary | ICD-10-CM | POA: Diagnosis not present

## 2020-01-21 DIAGNOSIS — R300 Dysuria: Secondary | ICD-10-CM | POA: Diagnosis not present

## 2020-01-25 ENCOUNTER — Other Ambulatory Visit: Payer: Self-pay

## 2020-01-25 ENCOUNTER — Emergency Department (HOSPITAL_COMMUNITY)
Admission: EM | Admit: 2020-01-25 | Discharge: 2020-01-26 | Disposition: A | Discharge status: Home / Self Care | Payer: Medicare Other | Attending: Emergency Medicine | Admitting: Emergency Medicine

## 2020-01-25 ENCOUNTER — Encounter (HOSPITAL_COMMUNITY): Payer: Self-pay | Admitting: *Deleted

## 2020-01-25 DIAGNOSIS — R079 Chest pain, unspecified: Secondary | ICD-10-CM | POA: Diagnosis not present

## 2020-01-25 DIAGNOSIS — N3000 Acute cystitis without hematuria: Secondary | ICD-10-CM

## 2020-01-25 DIAGNOSIS — F17200 Nicotine dependence, unspecified, uncomplicated: Secondary | ICD-10-CM | POA: Insufficient documentation

## 2020-01-25 DIAGNOSIS — N183 Chronic kidney disease, stage 3 unspecified: Secondary | ICD-10-CM | POA: Insufficient documentation

## 2020-01-25 DIAGNOSIS — I499 Cardiac arrhythmia, unspecified: Secondary | ICD-10-CM | POA: Diagnosis not present

## 2020-01-25 DIAGNOSIS — R0789 Other chest pain: Secondary | ICD-10-CM | POA: Diagnosis not present

## 2020-01-25 DIAGNOSIS — E039 Hypothyroidism, unspecified: Secondary | ICD-10-CM | POA: Diagnosis not present

## 2020-01-25 DIAGNOSIS — I4891 Unspecified atrial fibrillation: Secondary | ICD-10-CM

## 2020-01-25 DIAGNOSIS — Z79899 Other long term (current) drug therapy: Secondary | ICD-10-CM | POA: Insufficient documentation

## 2020-01-25 DIAGNOSIS — I10 Essential (primary) hypertension: Secondary | ICD-10-CM | POA: Diagnosis not present

## 2020-01-25 DIAGNOSIS — I129 Hypertensive chronic kidney disease with stage 1 through stage 4 chronic kidney disease, or unspecified chronic kidney disease: Secondary | ICD-10-CM | POA: Insufficient documentation

## 2020-01-25 DIAGNOSIS — Z7982 Long term (current) use of aspirin: Secondary | ICD-10-CM | POA: Insufficient documentation

## 2020-01-25 DIAGNOSIS — R109 Unspecified abdominal pain: Secondary | ICD-10-CM | POA: Diagnosis present

## 2020-01-25 LAB — LIPASE, BLOOD: Lipase: 92 U/L — ABNORMAL HIGH (ref 11–51)

## 2020-01-25 LAB — CBC
HCT: 35.2 % — ABNORMAL LOW (ref 36.0–46.0)
Hemoglobin: 11 g/dL — ABNORMAL LOW (ref 12.0–15.0)
MCH: 27.7 pg (ref 26.0–34.0)
MCHC: 31.3 g/dL (ref 30.0–36.0)
MCV: 88.7 fL (ref 80.0–100.0)
Platelets: 316 10*3/uL (ref 150–400)
RBC: 3.97 MIL/uL (ref 3.87–5.11)
RDW: 16.1 % — ABNORMAL HIGH (ref 11.5–15.5)
WBC: 14.1 10*3/uL — ABNORMAL HIGH (ref 4.0–10.5)
nRBC: 0 % (ref 0.0–0.2)

## 2020-01-25 LAB — URINALYSIS, ROUTINE W REFLEX MICROSCOPIC
Bilirubin Urine: NEGATIVE
Glucose, UA: NEGATIVE mg/dL
Ketones, ur: NEGATIVE mg/dL
Nitrite: NEGATIVE
Protein, ur: 30 mg/dL — AB
Specific Gravity, Urine: 1.011 (ref 1.005–1.030)
WBC, UA: >50 WBC/hpf — ABNORMAL HIGH (ref 0–5)
pH: 5 (ref 5.0–8.0)

## 2020-01-25 LAB — TROPONIN I (HIGH SENSITIVITY)
Troponin I (High Sensitivity): 13 ng/L (ref <18)
Troponin I (High Sensitivity): 14 ng/L (ref <18)

## 2020-01-25 LAB — COMPREHENSIVE METABOLIC PANEL
ALT: 16 U/L (ref 0–44)
AST: 24 U/L (ref 15–41)
Albumin: 3.2 g/dL — ABNORMAL LOW (ref 3.5–5.0)
Alkaline Phosphatase: 108 U/L (ref 38–126)
Anion gap: 12 (ref 5–15)
BUN: 20 mg/dL (ref 8–23)
CO2: 19 mmol/L — ABNORMAL LOW (ref 22–32)
Calcium: 9 mg/dL (ref 8.9–10.3)
Chloride: 106 mmol/L (ref 98–111)
Creatinine, Ser: 1.26 mg/dL — ABNORMAL HIGH (ref 0.44–1.00)
GFR calc Af Amer: 43 mL/min — ABNORMAL LOW (ref >60)
GFR calc non Af Amer: 37 mL/min — ABNORMAL LOW (ref >60)
Glucose, Bld: 111 mg/dL — ABNORMAL HIGH (ref 70–99)
Potassium: 4.2 mmol/L (ref 3.5–5.1)
Sodium: 137 mmol/L (ref 135–145)
Total Bilirubin: 0.5 mg/dL (ref 0.3–1.2)
Total Protein: 7.5 g/dL (ref 6.5–8.1)

## 2020-01-25 MED ORDER — CIPROFLOXACIN IN D5W 400 MG/200ML IV SOLN
400.0000 mg | Freq: Once | INTRAVENOUS | Status: AC
  Administered 2020-01-25: 400 mg via INTRAVENOUS
  Filled 2020-01-25: qty 200

## 2020-01-25 MED ORDER — CIPROFLOXACIN HCL 500 MG PO TABS: 500.0000 mg | ORAL_TABLET | Freq: Two times a day (BID) | ORAL | 0 refills | Status: DC

## 2020-01-25 MED ORDER — FENTANYL CITRATE (PF) 100 MCG/2ML IJ SOLN
50.0000 ug | Freq: Once | INTRAMUSCULAR | Status: AC
  Administered 2020-01-25: 50 ug via INTRAVENOUS
  Filled 2020-01-25: qty 2

## 2020-01-25 MED ORDER — DILTIAZEM HCL 25 MG/5ML IV SOLN
15.0000 mg | Freq: Once | INTRAVENOUS | Status: AC
  Administered 2020-01-25: 15 mg via INTRAVENOUS
  Filled 2020-01-25: qty 5

## 2020-01-25 MED ORDER — SODIUM CHLORIDE 0.9% FLUSH
3.0000 mL | Freq: Once | INTRAVENOUS | Status: AC
  Administered 2020-01-25: 3 mL via INTRAVENOUS

## 2020-01-25 NOTE — ED Triage Notes (Signed)
The pt arrived  By gems from home chest and abd pain with tachycardia today  Ems report af  The pt reports that she has ahole in her bladder and enlarged bowels from some type medicine   She is on an antibiotic and she reports each time she has this type medicine she has chest and arm pain  Mainly at present she has abd pain  Hr af  Iv rt forearm  Ems gave her 557ml nss on the way here

## 2020-01-25 NOTE — ED Provider Notes (Signed)
[facility] EMERGENCY DEPARTMENT Provider Note   CSN: [id number] Arrival date & time: 01/25/20  1922     History Chief Complaint  Patient presents with  . Abdominal Pain  . Tachycardia    [name] is a 86 y.o. female.  HPI   86 year old female with abdominal pain.  History of colovesicular fistula.  Recurrent urinary tract infections.  Worsening pain the last few days.  Denies any fevers or chills.  She denies any chest pain to me as noted in triage note.  She is noted to be in A. fib.  Assisted the same.  She reports compliance with her medications.  Past Medical History:  Diagnosis Date  . A-fib ([place])   . Aortic stenosis, moderate    last echo in 2010; AVA 1.1; mild AS per echo in June 2013  . Edema of both legs    s/p laser treatment per Dr. Donnetta Hutching  . Hiatal hernia   . History of chicken pox   . HLD (hyperlipidemia)   . HTN (hypertension)   . Hypothyroidism   . IBS (irritable bowel syndrome)   . Iron deficiency anemia   . Neuropathic pain 09/06/2015  . Rheumatoid arthritis [facility])     Patient Active Problem List   Diagnosis Date Noted  . Aortic atherosclerosis ([place]) 09/16/2019  . Acute CHF (congestive heart failure) ([place]) 09/02/2019  . UTI (urinary tract infection) 08/21/2019  . Colovesical fistula 05/28/2019  . Polyneuropathy 11/06/2018  . Goals of care, counseling/discussion   . Hydronephrosis 10/13/2018  . Ovarian mass 10/13/2018  . C. difficile colitis 10/13/2018  . Clostridium difficile colitis 09/24/2018  . Hypokalemia   . Hypomagnesemia   . Left ovarian cyst   . Acute diverticulitis 09/07/2018  . Leucocytosis 09/06/2018  . A-fib ([place]) 09/06/2018  . Malnutrition of moderate degree 07/26/2018  . Perforation of sigmoid colon due to diverticulitis 07/25/2018  . Thrombocytosis ([place]) 07/25/2018  . Sepsis ([place]) 07/24/2018  . Generalized abdominal pain 02/06/2018  . Paresthesia of both lower extremities 02/06/2018  . Chronic  kidney disease (CKD), stage III (moderate) 01/07/2018  . Normocytic normochromic anemia 01/07/2018  . Hyperglycemia 01/07/2018  . Iron deficiency 09/19/2017  . Pyoderma gangrenosum 01/04/2017  . Primary osteoarthritis of both hands 01/04/2017  . Primary osteoarthritis of both feet 01/04/2017  . Primary osteoarthritis of both knees 01/04/2017  . Degenerative joint disease involving multiple joints 01/04/2017  . Neuropathic pain 09/06/2015  . Erythema nodosum 09/21/2014  . Rheumatoid arthritis ([place]) 09/21/2014  . Essential hypertension 09/02/2014  . Rectal bleeding 05/15/2014  . Atrial fibrillation, chronic 05/11/2014  . HLD (hyperlipidemia) 04/02/2014  . Hypothyroidism 04/02/2014  . PAC (premature atrial contraction) 01/23/2014  . Near syncope 01/22/2014  . Varicose veins of lower extremities with other complications [id number]  . Localized edema 06/21/2012  . Venous insufficiency of both lower extremities 04/19/2012  . Leg edema 01/17/2012  . Anxiety 11/05/2011  . Aortic stenosis 03/22/2011    Past Surgical History:  Procedure Laterality Date  . APPENDECTOMY  1955  . CARPAL TUNNEL RELEASE Right   . CATARACT EXTRACTION    . ENDOSCOPIC VEIN LASER TREATMENT    . ENDOVENOUS ABLATION SAPHENOUS VEIN W/ LASER  08-29-2012   left greater saphenous vein   Sherren Mocha Early MD  . ENDOVENOUS ABLATION SAPHENOUS VEIN W/ LASER  09-19-2012   right greater saphenous vein by Curt Jews MD  . EYE SURGERY  2005   Bilateral cataract  . FOOT SURGERY  2003  bilateral Hammer Toe  . stab phlebectomy Right 01-02-2013   10-15 incisions right thigh and calf by Curt Jews MD     OB History   No obstetric history on file.     Family History  Problem Relation Age of Onset  . Heart disease Mother   . Peripheral vascular disease Mother   . Heart disease Father   . Peripheral vascular disease Father        Right leg amputation  . COPD Father   . Heart disease Brother 53       Heart Disease before  age 6  . Heart attack Brother   . Peripheral vascular disease Other   . Hypertension Sister     Social History   Tobacco Use  . Smoking status: Former Smoker    Types: Cigarettes    Quit date: 10/31/1983    Years since quitting: 36.2  . Smokeless tobacco: Never Used  Substance Use Topics  . Alcohol use: No  . Drug use: No    Home Medications Prior to Admission medications   Medication Sig Start Date End Date Taking? Authorizing Provider  acetaminophen (TYLENOL) 500 MG tablet Take 1,000 mg by mouth every 8 (eight) hours as needed (joint pain).     [provider]  ALPRAZolam Duanne Moron) 0.25 MG tablet TAKE 1 TABLET BY MOUTH ONCE DAILY AS NEEDED FOR ANXIETY 12/29/19   Nche, Charlene Brooke, NP  aspirin 81 MG chewable tablet Chew 1 tablet (81 mg total) by mouth daily. 10/26/18   Hennie Duos, MD  furosemide (LASIX) 20 MG tablet Take 1 tablet (20 mg total) by mouth daily. No additional refills without appt with nephrology and cardiology 10/21/19   Nche, Charlene Brooke, NP  gabapentin (NEURONTIN) 100 MG capsule Take 1 capsule (100 mg total) by mouth at bedtime. 06/30/19   Nche, Charlene Brooke, NP  irbesartan (AVAPRO) 75 MG tablet Take 0.5 tablets (37.5 mg total) by mouth daily. 09/06/19   Raiford Noble Latif, DO  levothyroxine (SYNTHROID) 75 MCG tablet TAKE 1 TABLET BY MOUTH ONCE DAILY BEFORE BREAKFAST 10/21/19   Nche, Charlene Brooke, NP  metoprolol succinate (TOPROL-XL) 25 MG 24 hr tablet Take 3 tablets (75 mg total) by mouth daily. 06/30/19   Nche, Charlene Brooke, NP  Multiple Vitamin (MULTIVITAMIN WITH MINERALS) TABS tablet Take 1 tablet by mouth daily. 09/27/18   Raiford Noble Latif, DO  Olopatadine HCl 0.2 % SOLN Place 1 drop into both eyes daily. Patient taking differently: Place 1 drop into both eyes 4 (four) times daily.  10/26/18   Hennie Duos, MD    Allergies    Infliximab, Aspirin, Carvedilol, Codeine, and Penicillins  Review of Systems   Review of Systems All  systems reviewed and negative, other than as noted in HPI.  Physical Exam Updated Vital Signs BP 140/89   Pulse (!) 108   Temp 98.5 F (36.9 C) (Oral)   Resp (!) 21   Ht 5' (1.524 m)   Wt 49.9 kg   SpO2 100%   BMI 21.48 kg/m   Physical Exam Vitals and nursing note reviewed.  Constitutional:      General: She is not in acute distress.    Appearance: She is well-developed.  HENT:     Head: Normocephalic and atraumatic.  Eyes:     General:        Right eye: No discharge.        Left eye: No discharge.     Conjunctiva/sclera: Conjunctivae  normal.  Cardiovascular:     Rate and Rhythm: Tachycardia present. Rhythm irregular.     Heart sounds: Normal heart sounds. No murmur. No friction rub. No gallop.      Comments: Mild tachycardia.  Irregularly irregular Pulmonary:     Effort: Pulmonary effort is normal. No respiratory distress.     Breath sounds: Normal breath sounds.  Abdominal:     General: There is no distension.     Palpations: Abdomen is soft.     Tenderness: There is abdominal tenderness.     Comments: Mild suprapubic tenderness without rebound or guarding.  No distention.  Musculoskeletal:        General: No tenderness.     Cervical back: Neck supple.  Skin:    General: Skin is warm and dry.  Neurological:     Mental Status: She is alert.  Psychiatric:        Behavior: Behavior normal.        Thought Content: Thought content normal.     ED Results / Procedures / Treatments   Labs (all labs ordered are listed, but only abnormal results are displayed) Labs Reviewed  LIPASE, BLOOD - Abnormal; Notable for the following components:      Result Value   Lipase 92 (*)    All other components within normal limits  COMPREHENSIVE METABOLIC PANEL - Abnormal; Notable for the following components:   CO2 19 (*)    Glucose, Bld 111 (*)    Creatinine, Ser 1.26 (*)    Albumin 3.2 (*)    GFR calc non Af Amer 37 (*)    GFR calc Af Amer 43 (*)    All other components  within normal limits  CBC - Abnormal; Notable for the following components:   WBC 14.1 (*)    Hemoglobin 11.0 (*)    HCT 35.2 (*)    RDW 16.1 (*)    All other components within normal limits  URINALYSIS, ROUTINE W REFLEX MICROSCOPIC - Abnormal; Notable for the following components:   APPearance CLOUDY (*)    Hgb urine dipstick SMALL (*)    Protein, ur 30 (*)    Leukocytes,Ua LARGE (*)    WBC, UA >50 (*)    Bacteria, UA RARE (*)    All other components within normal limits  TROPONIN I (HIGH SENSITIVITY)    EKG EKG Interpretation  Date/Time:  Sunday January 25 2020 19:23:38 EDT Ventricular Rate:  105 PR Interval:    QRS Duration: 80 QT Interval:  336 QTC Calculation: 444 R Axis:   46 Text Interpretation: Atrial fibrillation with rapid ventricular response with premature ventricular or aberrantly conducted complexes Abnormal ECG Confirmed by ,  ([id number]) on 01/25/2020 8:52:23 PM   Radiology No results found.  Procedures Procedures (including critical care time)  Medications Ordered in ED Medications  sodium chloride flush (NS) 0.9 % injection 3 mL (3 mLs Intravenous Given 01/25/20 2042)    ED Course  I have reviewed the triage vital signs and the nursing notes.  Pertinent labs & imaging results that were available during my care of the patient were reviewed by me and considered in my medical decision making (see chart for details).    MDM Rules/Calculators/A&P                      86  year old female with lower abdominal pain.  Symptoms consistent with cystitis.  She has a known colovesicular fistula.  Since she is symptomatic though, will  treat.  History of permanent A. fib.  Rate controlled.  Outpatient follow-up.  Final Clinical Impression(s) / ED Diagnoses Final diagnoses:  Acute cystitis without hematuria  Atrial fibrillation with rapid ventricular response [facility])    Rx / DC Orders ED Discharge Orders    None       Virgel Manifold, MD 01/28/20  [phone]

## 2020-01-26 ENCOUNTER — Telehealth (HOSPITAL_COMMUNITY): Payer: Self-pay

## 2020-01-26 ENCOUNTER — Other Ambulatory Visit: Payer: Self-pay | Admitting: Nurse Practitioner

## 2020-01-26 DIAGNOSIS — Z743 Need for continuous supervision: Secondary | ICD-10-CM | POA: Diagnosis not present

## 2020-01-26 DIAGNOSIS — R52 Pain, unspecified: Secondary | ICD-10-CM | POA: Diagnosis not present

## 2020-01-26 DIAGNOSIS — F418 Other specified anxiety disorders: Secondary | ICD-10-CM

## 2020-01-26 DIAGNOSIS — R279 Unspecified lack of coordination: Secondary | ICD-10-CM | POA: Diagnosis not present

## 2020-01-26 DIAGNOSIS — I959 Hypotension, unspecified: Secondary | ICD-10-CM | POA: Diagnosis not present

## 2020-01-26 DIAGNOSIS — R1084 Generalized abdominal pain: Secondary | ICD-10-CM | POA: Diagnosis not present

## 2020-01-26 NOTE — ED Notes (Signed)
Patient verbalizes understanding of discharge instructions. Opportunity for questioning and answers were provided. Armband removed by staff, pt discharged from ED. Pt. ambulatory and discharged home with [facility]

## 2020-01-26 NOTE — Telephone Encounter (Signed)
Reached out to patient to see if she was interested in a ED f/u appointment. She decline

## 2020-01-26 NOTE — ED Notes (Signed)
[facility] Called to take patient home (address on file) 

## 2020-01-26 NOTE — Telephone Encounter (Signed)
Please advise Last ov for this med was 12/24/18 Last refill 12/29/2019  Last 3 pick up at [facility]: 12/29/19 11/27/19 10/28/19

## 2020-02-06 ENCOUNTER — Encounter: Payer: Self-pay | Admitting: Nurse Practitioner

## 2020-02-06 ENCOUNTER — Other Ambulatory Visit: Payer: Self-pay

## 2020-02-06 ENCOUNTER — Ambulatory Visit (INDEPENDENT_AMBULATORY_CARE_PROVIDER_SITE_OTHER): Payer: Medicare Other | Admitting: Nurse Practitioner

## 2020-02-06 VITALS — BP 146/77 | HR 82 | Ht 60.0 in | Wt 116.0 lb

## 2020-02-06 DIAGNOSIS — R197 Diarrhea, unspecified: Secondary | ICD-10-CM

## 2020-02-06 NOTE — Progress Notes (Signed)
Virtual Visit via Telephone Note  I connected with [name] on 02/06/20 at  2:00 PM EDT by telephone and verified that I am speaking with the correct person using two identifiers.  Location: Patient: home Provider: office Participants: patient and provider  I discussed the limitations, risks, security and privacy concerns of performing an evaluation and management service by telephone and the availability of in person appointments. I also discussed with the patient that there may be a patient responsible charge related to this service. The patient expressed understanding and agreed to proceed.  CC: diarrhea and weakness 2days ago.  History of Present Illness: [name] reports she had 2episodes of diarrhea 2days ago, this led to generalized weakness and SOB. These symptoms have now resolved with use of kaopectate 4x/day.  She denies any PND, palpitations, hematochezia, diaphoresis, nausea, vomiting, dizziness or syncope.   Observations/Objective: Reviewed vital signs reported Alert and oriented x 4 Limited exam due telephone call  Assessment and Plan: [name] was seen today for diarrhea and shortness of breath.  Diagnoses and all orders for this visit:  Diarrhea, unspecified type   Follow Up Instructions: See avs   I discussed the assessment and treatment plan with the patient. The patient was provided an opportunity to ask questions and all were answered. The patient agreed with the plan and demonstrated an understanding of the instructions.   The patient was advised to call back or seek an in-person evaluation if the symptoms worsen or if the condition fails to improve as anticipated.  I provided 15 minutes of non-face-to-face time during this encounter.   Wilfred Lacy, NP

## 2020-02-09 ENCOUNTER — Other Ambulatory Visit: Payer: Self-pay

## 2020-02-09 ENCOUNTER — Encounter (HOSPITAL_COMMUNITY): Payer: Self-pay

## 2020-02-09 DIAGNOSIS — E872 Acidosis: Secondary | ICD-10-CM | POA: Diagnosis not present

## 2020-02-09 DIAGNOSIS — N183 Chronic kidney disease, stage 3 unspecified: Secondary | ICD-10-CM | POA: Diagnosis present

## 2020-02-09 DIAGNOSIS — Z87891 Personal history of nicotine dependence: Secondary | ICD-10-CM

## 2020-02-09 DIAGNOSIS — G629 Polyneuropathy, unspecified: Secondary | ICD-10-CM | POA: Diagnosis present

## 2020-02-09 DIAGNOSIS — Z825 Family history of asthma and other chronic lower respiratory diseases: Secondary | ICD-10-CM

## 2020-02-09 DIAGNOSIS — B9620 Unspecified Escherichia coli [E. coli] as the cause of diseases classified elsewhere: Secondary | ICD-10-CM | POA: Diagnosis present

## 2020-02-09 DIAGNOSIS — Z20822 Contact with and (suspected) exposure to covid-19: Secondary | ICD-10-CM | POA: Diagnosis not present

## 2020-02-09 DIAGNOSIS — N321 Vesicointestinal fistula: Secondary | ICD-10-CM | POA: Diagnosis present

## 2020-02-09 DIAGNOSIS — N170 Acute kidney failure with tubular necrosis: Secondary | ICD-10-CM | POA: Diagnosis not present

## 2020-02-09 DIAGNOSIS — Z79899 Other long term (current) drug therapy: Secondary | ICD-10-CM

## 2020-02-09 DIAGNOSIS — Z7982 Long term (current) use of aspirin: Secondary | ICD-10-CM

## 2020-02-09 DIAGNOSIS — N3090 Cystitis, unspecified without hematuria: Secondary | ICD-10-CM | POA: Diagnosis not present

## 2020-02-09 DIAGNOSIS — Z880 Allergy status to penicillin: Secondary | ICD-10-CM

## 2020-02-09 DIAGNOSIS — N3020 Other chronic cystitis without hematuria: Secondary | ICD-10-CM | POA: Diagnosis not present

## 2020-02-09 DIAGNOSIS — R1084 Generalized abdominal pain: Secondary | ICD-10-CM | POA: Diagnosis not present

## 2020-02-09 DIAGNOSIS — I5022 Chronic systolic (congestive) heart failure: Secondary | ICD-10-CM | POA: Diagnosis present

## 2020-02-09 DIAGNOSIS — N3000 Acute cystitis without hematuria: Secondary | ICD-10-CM | POA: Diagnosis not present

## 2020-02-09 DIAGNOSIS — I2510 Atherosclerotic heart disease of native coronary artery without angina pectoris: Secondary | ICD-10-CM | POA: Diagnosis present

## 2020-02-09 DIAGNOSIS — Z79890 Hormone replacement therapy (postmenopausal): Secondary | ICD-10-CM

## 2020-02-09 DIAGNOSIS — D638 Anemia in other chronic diseases classified elsewhere: Secondary | ICD-10-CM | POA: Diagnosis present

## 2020-02-09 DIAGNOSIS — K449 Diaphragmatic hernia without obstruction or gangrene: Secondary | ICD-10-CM | POA: Diagnosis not present

## 2020-02-09 DIAGNOSIS — I4891 Unspecified atrial fibrillation: Secondary | ICD-10-CM | POA: Diagnosis not present

## 2020-02-09 DIAGNOSIS — N1330 Unspecified hydronephrosis: Secondary | ICD-10-CM | POA: Diagnosis not present

## 2020-02-09 DIAGNOSIS — M069 Rheumatoid arthritis, unspecified: Secondary | ICD-10-CM | POA: Diagnosis present

## 2020-02-09 DIAGNOSIS — Z8249 Family history of ischemic heart disease and other diseases of the circulatory system: Secondary | ICD-10-CM

## 2020-02-09 DIAGNOSIS — R300 Dysuria: Secondary | ICD-10-CM | POA: Diagnosis not present

## 2020-02-09 DIAGNOSIS — I4821 Permanent atrial fibrillation: Secondary | ICD-10-CM | POA: Diagnosis not present

## 2020-02-09 DIAGNOSIS — J80 Acute respiratory distress syndrome: Secondary | ICD-10-CM | POA: Diagnosis not present

## 2020-02-09 DIAGNOSIS — E039 Hypothyroidism, unspecified: Secondary | ICD-10-CM | POA: Diagnosis present

## 2020-02-09 DIAGNOSIS — I130 Hypertensive heart and chronic kidney disease with heart failure and stage 1 through stage 4 chronic kidney disease, or unspecified chronic kidney disease: Secondary | ICD-10-CM | POA: Diagnosis present

## 2020-02-09 DIAGNOSIS — Z1612 Extended spectrum beta lactamase (ESBL) resistance: Secondary | ICD-10-CM | POA: Diagnosis present

## 2020-02-09 DIAGNOSIS — R0602 Shortness of breath: Secondary | ICD-10-CM | POA: Diagnosis not present

## 2020-02-09 DIAGNOSIS — D509 Iron deficiency anemia, unspecified: Secondary | ICD-10-CM | POA: Diagnosis present

## 2020-02-09 DIAGNOSIS — I350 Nonrheumatic aortic (valve) stenosis: Secondary | ICD-10-CM | POA: Diagnosis present

## 2020-02-09 DIAGNOSIS — R1030 Lower abdominal pain, unspecified: Secondary | ICD-10-CM | POA: Diagnosis not present

## 2020-02-09 DIAGNOSIS — I959 Hypotension, unspecified: Secondary | ICD-10-CM | POA: Diagnosis present

## 2020-02-09 DIAGNOSIS — N1339 Other hydronephrosis: Secondary | ICD-10-CM | POA: Diagnosis present

## 2020-02-09 MED ORDER — SODIUM CHLORIDE 0.9% FLUSH: 3.0000 mL | Freq: Once | INTRAVENOUS | Status: DC

## 2020-02-09 NOTE — ED Notes (Signed)
Pt called in ED lobby for labs- no answer x1. [employer]

## 2020-02-09 NOTE — ED Triage Notes (Signed)
Patient arrived via [facility] with complaints of lower abdominal pain for 3 years off and on. Reporting due to her history she is prone to UTIs and pain today seems the same. Originally stating with EMS she felt short of breath which has now resolved.

## 2020-02-10 ENCOUNTER — Inpatient Hospital Stay (HOSPITAL_COMMUNITY)
Admission: EM | Admit: 2020-02-10 | Discharge: 2020-02-16 | DRG: 689 | Disposition: A | Discharge status: Skilled Nursing Facility | Payer: Medicare Other | Attending: Family Medicine | Admitting: Family Medicine

## 2020-02-10 ENCOUNTER — Emergency Department (HOSPITAL_COMMUNITY): Payer: Medicare Other

## 2020-02-10 DIAGNOSIS — N3090 Cystitis, unspecified without hematuria: Secondary | ICD-10-CM | POA: Diagnosis not present

## 2020-02-10 DIAGNOSIS — R109 Unspecified abdominal pain: Principal | ICD-10-CM

## 2020-02-10 DIAGNOSIS — Z8249 Family history of ischemic heart disease and other diseases of the circulatory system: Secondary | ICD-10-CM | POA: Diagnosis not present

## 2020-02-10 DIAGNOSIS — N321 Vesicointestinal fistula: Secondary | ICD-10-CM

## 2020-02-10 DIAGNOSIS — F418 Other specified anxiety disorders: Secondary | ICD-10-CM

## 2020-02-10 DIAGNOSIS — M6281 Muscle weakness (generalized): Secondary | ICD-10-CM | POA: Diagnosis not present

## 2020-02-10 DIAGNOSIS — Z1612 Extended spectrum beta lactamase (ESBL) resistance: Secondary | ICD-10-CM | POA: Diagnosis not present

## 2020-02-10 DIAGNOSIS — I4891 Unspecified atrial fibrillation: Secondary | ICD-10-CM

## 2020-02-10 DIAGNOSIS — N1330 Unspecified hydronephrosis: Secondary | ICD-10-CM | POA: Diagnosis not present

## 2020-02-10 DIAGNOSIS — N183 Chronic kidney disease, stage 3 unspecified: Secondary | ICD-10-CM | POA: Diagnosis not present

## 2020-02-10 DIAGNOSIS — Z20822 Contact with and (suspected) exposure to covid-19: Secondary | ICD-10-CM | POA: Diagnosis not present

## 2020-02-10 DIAGNOSIS — Z79890 Hormone replacement therapy (postmenopausal): Secondary | ICD-10-CM | POA: Diagnosis not present

## 2020-02-10 DIAGNOSIS — R279 Unspecified lack of coordination: Secondary | ICD-10-CM | POA: Diagnosis not present

## 2020-02-10 DIAGNOSIS — Z452 Encounter for adjustment and management of vascular access device: Secondary | ICD-10-CM | POA: Diagnosis not present

## 2020-02-10 DIAGNOSIS — R1084 Generalized abdominal pain: Secondary | ICD-10-CM | POA: Diagnosis not present

## 2020-02-10 DIAGNOSIS — R1030 Lower abdominal pain, unspecified: Secondary | ICD-10-CM | POA: Diagnosis not present

## 2020-02-10 DIAGNOSIS — D509 Iron deficiency anemia, unspecified: Secondary | ICD-10-CM | POA: Diagnosis present

## 2020-02-10 DIAGNOSIS — Z743 Need for continuous supervision: Secondary | ICD-10-CM | POA: Diagnosis not present

## 2020-02-10 DIAGNOSIS — R531 Weakness: Secondary | ICD-10-CM | POA: Diagnosis not present

## 2020-02-10 DIAGNOSIS — J9811 Atelectasis: Secondary | ICD-10-CM | POA: Diagnosis not present

## 2020-02-10 DIAGNOSIS — I350 Nonrheumatic aortic (valve) stenosis: Secondary | ICD-10-CM | POA: Diagnosis not present

## 2020-02-10 DIAGNOSIS — I5022 Chronic systolic (congestive) heart failure: Secondary | ICD-10-CM | POA: Diagnosis not present

## 2020-02-10 DIAGNOSIS — N3000 Acute cystitis without hematuria: Secondary | ICD-10-CM | POA: Diagnosis not present

## 2020-02-10 DIAGNOSIS — D649 Anemia, unspecified: Secondary | ICD-10-CM | POA: Diagnosis not present

## 2020-02-10 DIAGNOSIS — R41841 Cognitive communication deficit: Secondary | ICD-10-CM | POA: Diagnosis not present

## 2020-02-10 DIAGNOSIS — Z880 Allergy status to penicillin: Secondary | ICD-10-CM | POA: Diagnosis not present

## 2020-02-10 DIAGNOSIS — Z825 Family history of asthma and other chronic lower respiratory diseases: Secondary | ICD-10-CM | POA: Diagnosis not present

## 2020-02-10 DIAGNOSIS — N3592 Unspecified urethral stricture, female: Secondary | ICD-10-CM | POA: Diagnosis not present

## 2020-02-10 DIAGNOSIS — I872 Venous insufficiency (chronic) (peripheral): Secondary | ICD-10-CM | POA: Diagnosis not present

## 2020-02-10 DIAGNOSIS — Z7982 Long term (current) use of aspirin: Secondary | ICD-10-CM | POA: Diagnosis not present

## 2020-02-10 DIAGNOSIS — I4821 Permanent atrial fibrillation: Secondary | ICD-10-CM | POA: Diagnosis not present

## 2020-02-10 DIAGNOSIS — D638 Anemia in other chronic diseases classified elsewhere: Secondary | ICD-10-CM | POA: Diagnosis present

## 2020-02-10 DIAGNOSIS — K449 Diaphragmatic hernia without obstruction or gangrene: Secondary | ICD-10-CM | POA: Diagnosis not present

## 2020-02-10 DIAGNOSIS — R0602 Shortness of breath: Secondary | ICD-10-CM | POA: Diagnosis not present

## 2020-02-10 DIAGNOSIS — R2681 Unsteadiness on feet: Secondary | ICD-10-CM | POA: Diagnosis not present

## 2020-02-10 DIAGNOSIS — I2510 Atherosclerotic heart disease of native coronary artery without angina pectoris: Secondary | ICD-10-CM | POA: Diagnosis present

## 2020-02-10 DIAGNOSIS — Z87891 Personal history of nicotine dependence: Secondary | ICD-10-CM | POA: Diagnosis not present

## 2020-02-10 DIAGNOSIS — J810 Acute pulmonary edema: Secondary | ICD-10-CM | POA: Diagnosis not present

## 2020-02-10 DIAGNOSIS — I129 Hypertensive chronic kidney disease with stage 1 through stage 4 chronic kidney disease, or unspecified chronic kidney disease: Secondary | ICD-10-CM | POA: Diagnosis not present

## 2020-02-10 DIAGNOSIS — I130 Hypertensive heart and chronic kidney disease with heart failure and stage 1 through stage 4 chronic kidney disease, or unspecified chronic kidney disease: Secondary | ICD-10-CM | POA: Diagnosis not present

## 2020-02-10 DIAGNOSIS — Z79899 Other long term (current) drug therapy: Secondary | ICD-10-CM | POA: Diagnosis not present

## 2020-02-10 DIAGNOSIS — E872 Acidosis: Secondary | ICD-10-CM | POA: Diagnosis not present

## 2020-02-10 DIAGNOSIS — R062 Wheezing: Secondary | ICD-10-CM

## 2020-02-10 DIAGNOSIS — N1339 Other hydronephrosis: Secondary | ICD-10-CM | POA: Diagnosis not present

## 2020-02-10 DIAGNOSIS — G629 Polyneuropathy, unspecified: Secondary | ICD-10-CM | POA: Diagnosis present

## 2020-02-10 DIAGNOSIS — R55 Syncope and collapse: Secondary | ICD-10-CM | POA: Diagnosis not present

## 2020-02-10 DIAGNOSIS — N170 Acute kidney failure with tubular necrosis: Secondary | ICD-10-CM | POA: Diagnosis not present

## 2020-02-10 DIAGNOSIS — R000 Tachycardia, unspecified: Secondary | ICD-10-CM | POA: Diagnosis not present

## 2020-02-10 DIAGNOSIS — B9620 Unspecified Escherichia coli [E. coli] as the cause of diseases classified elsewhere: Secondary | ICD-10-CM | POA: Diagnosis not present

## 2020-02-10 DIAGNOSIS — E039 Hypothyroidism, unspecified: Secondary | ICD-10-CM | POA: Diagnosis present

## 2020-02-10 LAB — URINALYSIS, ROUTINE W REFLEX MICROSCOPIC
Bilirubin Urine: NEGATIVE
Glucose, UA: NEGATIVE mg/dL
Nitrite: POSITIVE — AB
Protein, ur: >300 mg/dL — AB
Specific Gravity, Urine: 1.025 (ref 1.005–1.030)
pH: 6 (ref 5.0–8.0)

## 2020-02-10 LAB — TROPONIN I (HIGH SENSITIVITY): Troponin I (High Sensitivity): 7 ng/L (ref <18)

## 2020-02-10 LAB — COMPREHENSIVE METABOLIC PANEL
ALT: 15 U/L (ref 0–44)
AST: 24 U/L (ref 15–41)
Albumin: 3.6 g/dL (ref 3.5–5.0)
Alkaline Phosphatase: 101 U/L (ref 38–126)
Anion gap: 9 (ref 5–15)
BUN: 26 mg/dL — ABNORMAL HIGH (ref 8–23)
CO2: 18 mmol/L — ABNORMAL LOW (ref 22–32)
Calcium: 8.9 mg/dL (ref 8.9–10.3)
Chloride: 111 mmol/L (ref 98–111)
Creatinine, Ser: 1.39 mg/dL — ABNORMAL HIGH (ref 0.44–1.00)
GFR calc Af Amer: 39 mL/min — ABNORMAL LOW (ref >60)
GFR calc non Af Amer: 33 mL/min — ABNORMAL LOW (ref >60)
Glucose, Bld: 105 mg/dL — ABNORMAL HIGH (ref 70–99)
Potassium: 4.3 mmol/L (ref 3.5–5.1)
Sodium: 138 mmol/L (ref 135–145)
Total Bilirubin: 0.6 mg/dL (ref 0.3–1.2)
Total Protein: 7.8 g/dL (ref 6.5–8.1)

## 2020-02-10 LAB — URINALYSIS, MICROSCOPIC (REFLEX): WBC, UA: >50 WBC/hpf (ref 0–5)

## 2020-02-10 LAB — CBC
HCT: 28.4 % — ABNORMAL LOW (ref 36.0–46.0)
Hemoglobin: 8.8 g/dL — ABNORMAL LOW (ref 12.0–15.0)
MCH: 27.8 pg (ref 26.0–34.0)
MCHC: 31 g/dL (ref 30.0–36.0)
MCV: 89.6 fL (ref 80.0–100.0)
Platelets: 423 10*3/uL — ABNORMAL HIGH (ref 150–400)
RBC: 3.17 MIL/uL — ABNORMAL LOW (ref 3.87–5.11)
RDW: 17.6 % — ABNORMAL HIGH (ref 11.5–15.5)
WBC: 10.6 10*3/uL — ABNORMAL HIGH (ref 4.0–10.5)
nRBC: 0 % (ref 0.0–0.2)

## 2020-02-10 LAB — SARS CORONAVIRUS 2 (TAT 6-24 HRS): SARS Coronavirus 2: NEGATIVE

## 2020-02-10 LAB — LIPASE, BLOOD: Lipase: 98 U/L — ABNORMAL HIGH (ref 11–51)

## 2020-02-10 MED ORDER — HYDROMORPHONE HCL 1 MG/ML IJ SOLN
0.5000 mg | INTRAMUSCULAR | Status: DC | PRN
  Administered 2020-02-10 – 2020-02-12 (×4): 0.5 mg via INTRAVENOUS
  Filled 2020-02-10 (×4): qty 0.5

## 2020-02-10 MED ORDER — SODIUM CHLORIDE (PF) 0.9 % IJ SOLN
INTRAMUSCULAR | Status: AC
  Filled 2020-02-10: qty 50

## 2020-02-10 MED ORDER — ONDANSETRON HCL 4 MG PO TABS: 4.0000 mg | ORAL_TABLET | Freq: Four times a day (QID) | ORAL | Status: DC | PRN

## 2020-02-10 MED ORDER — IOHEXOL 300 MG/ML  SOLN
75.0000 mL | Freq: Once | INTRAMUSCULAR | Status: AC | PRN
  Administered 2020-02-10: 75 mL via INTRAVENOUS

## 2020-02-10 MED ORDER — LEVOTHYROXINE SODIUM 50 MCG PO TABS
75.0000 ug | ORAL_TABLET | Freq: Every day | ORAL | Status: DC
  Administered 2020-02-11 – 2020-02-16 (×6): 75 ug via ORAL
  Filled 2020-02-10 (×7): qty 1

## 2020-02-10 MED ORDER — ASPIRIN 81 MG PO CHEW
81.0000 mg | CHEWABLE_TABLET | Freq: Every day | ORAL | Status: DC
  Administered 2020-02-10 – 2020-02-16 (×7): 81 mg via ORAL
  Filled 2020-02-10 (×7): qty 1

## 2020-02-10 MED ORDER — METRONIDAZOLE IN NACL 5-0.79 MG/ML-% IV SOLN
500.0000 mg | Freq: Three times a day (TID) | INTRAVENOUS | Status: DC
  Administered 2020-02-10 – 2020-02-13 (×8): 500 mg via INTRAVENOUS
  Filled 2020-02-10 (×8): qty 100

## 2020-02-10 MED ORDER — METRONIDAZOLE IN NACL 5-0.79 MG/ML-% IV SOLN
500.0000 mg | INTRAVENOUS | Status: AC
  Administered 2020-02-10: 500 mg via INTRAVENOUS
  Filled 2020-02-10: qty 100

## 2020-02-10 MED ORDER — FENTANYL CITRATE (PF) 100 MCG/2ML IJ SOLN
50.0000 ug | Freq: Once | INTRAMUSCULAR | Status: AC
  Administered 2020-02-10: 50 ug via INTRAVENOUS
  Filled 2020-02-10: qty 2

## 2020-02-10 MED ORDER — ADULT MULTIVITAMIN W/MINERALS CH
1.0000 | ORAL_TABLET | Freq: Every day | ORAL | Status: DC
  Administered 2020-02-15: 1 via ORAL
  Filled 2020-02-10 (×6): qty 1

## 2020-02-10 MED ORDER — ONDANSETRON HCL 4 MG/2ML IJ SOLN
4.0000 mg | Freq: Four times a day (QID) | INTRAMUSCULAR | Status: DC | PRN
  Administered 2020-02-11: 4 mg via INTRAVENOUS
  Filled 2020-02-10: qty 2

## 2020-02-10 MED ORDER — IOHEXOL 9 MG/ML PO SOLN: 1000.0000 mL | ORAL | Status: AC

## 2020-02-10 MED ORDER — ONDANSETRON HCL 4 MG/2ML IJ SOLN
4.0000 mg | Freq: Once | INTRAMUSCULAR | Status: AC
  Administered 2020-02-10: 4 mg via INTRAVENOUS
  Filled 2020-02-10: qty 2

## 2020-02-10 MED ORDER — ALPRAZOLAM 0.25 MG PO TABS
0.2500 mg | ORAL_TABLET | Freq: Two times a day (BID) | ORAL | Status: DC | PRN
  Administered 2020-02-11 – 2020-02-15 (×6): 0.25 mg via ORAL
  Filled 2020-02-10 (×8): qty 1

## 2020-02-10 MED ORDER — SODIUM CHLORIDE 0.9 % IV SOLN
2.0000 g | Freq: Once | INTRAVENOUS | Status: AC
  Administered 2020-02-10: 10:00:00 2 g via INTRAVENOUS
  Filled 2020-02-10: qty 2

## 2020-02-10 MED ORDER — ACETAMINOPHEN 500 MG PO TABS
1000.0000 mg | ORAL_TABLET | Freq: Three times a day (TID) | ORAL | Status: DC | PRN
  Administered 2020-02-11 – 2020-02-13 (×4): 1000 mg via ORAL
  Filled 2020-02-10 (×4): qty 2

## 2020-02-10 MED ORDER — IOHEXOL 9 MG/ML PO SOLN
ORAL | Status: AC
  Administered 2020-02-10: 1000 mL via ORAL
  Filled 2020-02-10: qty 1000

## 2020-02-10 MED ORDER — SODIUM CHLORIDE 0.9 % IV SOLN: INTRAVENOUS | Status: AC

## 2020-02-10 MED ORDER — GABAPENTIN 100 MG PO CAPS
100.0000 mg | ORAL_CAPSULE | Freq: Every day | ORAL | Status: DC
  Administered 2020-02-10 – 2020-02-15 (×5): 100 mg via ORAL
  Filled 2020-02-10 (×6): qty 1

## 2020-02-10 MED ORDER — METOPROLOL SUCCINATE ER 50 MG PO TB24
75.0000 mg | ORAL_TABLET | Freq: Every day | ORAL | Status: DC
  Administered 2020-02-10 – 2020-02-16 (×6): 75 mg via ORAL
  Filled 2020-02-10 (×7): qty 1

## 2020-02-10 MED ORDER — HEPARIN SODIUM (PORCINE) 5000 UNIT/ML IJ SOLN
5000.0000 [IU] | Freq: Two times a day (BID) | INTRAMUSCULAR | Status: DC
  Administered 2020-02-12 – 2020-02-16 (×8): 5000 [IU] via SUBCUTANEOUS
  Filled 2020-02-10 (×9): qty 1

## 2020-02-10 MED ORDER — SODIUM CHLORIDE 0.9 % IV SOLN
2.0000 g | INTRAVENOUS | Status: DC
  Administered 2020-02-11 – 2020-02-12 (×2): 2 g via INTRAVENOUS
  Filled 2020-02-10 (×3): qty 2

## 2020-02-10 NOTE — ED Notes (Signed)
Patient is complaining of chest pain at this time. MD made aware and EKG done. MD placing order for EKG and Troponin

## 2020-02-10 NOTE — ED Notes (Signed)
RN updated patient's family on plan of care. Patient's son Merry Proud made aware of condition.

## 2020-02-10 NOTE — ED Provider Notes (Signed)
Patient seen after prior ED provider  Patient's work-up demonstrates evidence of acute on chronic cystitis.  Antibiotics initiated in the ED.  Patient would benefit from admission.    Hospitalist service is aware case and will evaluate for admission.  Patient understands plan of care and need for admission.     Valarie Merino, MD 02/10/20 1105

## 2020-02-10 NOTE — ED Notes (Signed)
Patty, RN and myself assisted the pt in cleaning up after having a BM in the bed. New sheets placed and new purewick put on pt. Pt had also pulled out her IV so a new one was placed.

## 2020-02-10 NOTE — Progress Notes (Signed)
A consult was received from an ED physician for Cefepime per pharmacy dosing.  The patient's profile has been reviewed for ht/wt/allergies/indication/available labs.   Noted PCN allergy (rash) - pt has tolerated Cefepime and ceftriaxone on several occasions. SCr 1.39 (near baseline) with CrCl ~ 19 ml/min  A one time order has been placed for Cefepime 2g.  Further antibiotics/pharmacy consults should be ordered by admitting physician if indicated.                       Thank you,  Gretta Arab PharmD, BCPS Pager: [phone] 02/10/2020 8:18 AM

## 2020-02-10 NOTE — Progress Notes (Signed)
Patient came to the floor for the ED. She is in the yellow MEWS due to HR at 128. During report ED nurse informed me that patient HR has been elevated and she has been in afib. ED doctors are aware of high heart rate and patient being in atrial fib.  Patient is asymptomatic and is resting in bed. Charge nurse [place] was with me when yellow MEWS discovered. Did not notify MD as this is not an acute change for patient.   VS at 1724 T 98.3 BP 122/90 HR 128 RR 20 O2 100 % on RA.

## 2020-02-10 NOTE — H&P (Signed)
History and Physical    [name] [id number] DOB: Mar 08, 1927 DOA: 02/10/2020  PCP: [name], NP Patient coming from: Home  I have personally briefly reviewed patient's old medical records in [place]  Chief Complaint: Abdominal pain  HPI: [name] is a 86 y.o. female with medical history significant of atrial fibrillation (refused anticoagulation), aortic stenosis, iron deficiency anemia, HTN, HLD, CKD, hypothyroidism, rheumatoid arthritis, colovesical fistula (declined surgery) who presents with acute on chronic worsening abdominal pain.  Patient reports for years she has been dealing with lower abdominal pain due to fistula but in the last 3 weeks the pain has become much more intense.  She she reports that she has sharp pain every time she tries to urinate.  She reports constant burning with urination as well.  She reports that has significantly worsened to the point that she gets short of breath from the pain.  She notices now that she cannot walk very far before she becomes short of breath due to the pain.  Any fevers, chills, nausea or vomiting.  She reports 2 days of self-limited diarrhea.  She denies any cough.  On review of systems she becomes very nonspecific and states she has multiple medical problems and multiple chronic symptoms but nothing is new and not when she currently wants addressed.  She tells me over however the pain and urinary symptoms are the only thing that bothered her.  She does however tell me after her CT scan she developed a traveling pain from her left lower extremity up to her left upper extremity and then all over her body.  This is now resolved.  Non-smoker, no alcohol, no drugs  She lives at home with her son, she has 2 sons.  She reports he is her caregiver.  She reports she ambulates without any DME but does have a walker  We do talk about all her medical problems and she explains that she does not want to have  any invasive procedures done and has refused surgery in the past for her fistula.  She also would not want to be on anticoagulation for atrial fibrillation.   Review of Systems: As per HPI otherwise 10 point review of systems negative.    Past Medical History:  Diagnosis Date   A-fib [facility])    Aortic stenosis, moderate    last echo in 2010; AVA 1.1; mild AS per echo in June 2013   Edema of both legs    s/p laser treatment per Dr. Donnetta Hutching   Hiatal hernia    History of chicken pox    HLD (hyperlipidemia)    HTN (hypertension)    Hypothyroidism    IBS (irritable bowel syndrome)    Iron deficiency anemia    Neuropathic pain 09/06/2015   Rheumatoid arthritis ([place])     Past Surgical History:  Procedure Laterality Date   APPENDECTOMY  1955   CARPAL TUNNEL RELEASE Right    CATARACT EXTRACTION     ENDOSCOPIC VEIN LASER TREATMENT     ENDOVENOUS ABLATION SAPHENOUS VEIN W/ LASER  08-29-2012   left greater saphenous vein   Curt Jews MD   ENDOVENOUS ABLATION SAPHENOUS VEIN W/ LASER  09-19-2012   right greater saphenous vein by Curt Jews MD   EYE SURGERY  2005   Bilateral cataract   FOOT SURGERY  2003   bilateral Hammer Toe   stab phlebectomy Right 01-02-2013   10-15 incisions right thigh and calf by Curt Jews MD  reports that she quit smoking about 36 years ago. Her smoking use included cigarettes. She has never used smokeless tobacco. She reports that she does not drink alcohol or use drugs.  Allergies  Allergen Reactions   Infliximab Other (See Comments)    REACTION: "Enlarged intestines and hernia. Also pushed intestines on lower part of lungs." REACTION: "Enlarged intestines and hernia. Also pushed intestines on lower part of lungs."   Aspirin Nausea And Vomiting    Low Dose is ok Stomach problems   Carvedilol Other (See Comments)    Fatigue/ ill Fatigue/ ill   Codeine Nausea And Vomiting    "Deathly Sick"   Penicillins Rash    Has patient  had a PCN reaction causing immediate rash, facial/tongue/throat swelling, SOB or lightheadedness with hypotension: Y Has patient had a PCN reaction causing severe rash involving mucus membranes or skin necrosis: Y Has patient had a PCN reaction that required hospitalization: N Has patient had a PCN reaction occurring within the last 10 years: N If all of the above answers are "NO", then may proceed with Cephalosporin use.     Family History  Problem Relation Age of Onset   Heart disease Mother    Peripheral vascular disease Mother    Heart disease Father    Peripheral vascular disease Father        Right leg amputation   COPD Father    Heart disease Brother 11       Heart Disease before age 62   Heart attack Brother    Peripheral vascular disease Other    Hypertension Sister      Prior to Admission medications   Medication Sig Start Date End Date Taking? Authorizing Provider  acetaminophen (TYLENOL) 500 MG tablet Take 1,000 mg by mouth every 8 (eight) hours as needed (joint pain).    Yes [provider]  ALPRAZolam (XANAX) 0.25 MG tablet TAKE 1 TABLET BY MOUTH ONCE DAILY AS NEEDED FOR ANXIETY Patient taking differently: Take 0.25 mg by mouth 2 (two) times daily as needed for anxiety (restless legs).  01/26/20  Yes Nche, Charlene Brooke, NP  aspirin 81 MG chewable tablet Chew 1 tablet (81 mg total) by mouth daily. 10/26/18  Yes Hennie Duos, MD  gabapentin (NEURONTIN) 100 MG capsule Take 1 capsule (100 mg total) by mouth at bedtime. 06/30/19  Yes Nche, Charlene Brooke, NP  levothyroxine (SYNTHROID) 75 MCG tablet TAKE 1 TABLET BY MOUTH ONCE DAILY BEFORE BREAKFAST Patient taking differently: Take 75 mcg by mouth daily before breakfast.  10/21/19  Yes Nche, Charlene Brooke, NP  metoprolol succinate (TOPROL-XL) 25 MG 24 hr tablet Take 3 tablets (75 mg total) by mouth daily. 06/30/19  Yes Nche, Charlene Brooke, NP  Multiple Vitamin (MULTIVITAMIN WITH MINERALS) TABS tablet Take  1 tablet by mouth daily. 09/27/18  Yes Sheikh, Omair Latif, DO  furosemide (LASIX) 20 MG tablet Take 1 tablet (20 mg total) by mouth daily. No additional refills without appt with nephrology and cardiology Patient not taking: Reported on 02/10/2020 10/21/19   Nche, Charlene Brooke, NP  irbesartan (AVAPRO) 75 MG tablet Take 0.5 tablets (37.5 mg total) by mouth daily. Patient not taking: Reported on 02/10/2020 09/06/19   Raiford Noble Latif, DO  Olopatadine HCl 0.2 % SOLN Place 1 drop into both eyes daily. Patient not taking: Reported on 02/10/2020 10/26/18   Hennie Duos, MD    Physical Exam: Vitals:   02/10/20 0730 02/10/20 0900 02/10/20 0930 02/10/20 1008  BP: 136/77 131/72  105/85 (!) 139/91  Pulse: 83 77 93   Resp: 19 19 19  (!) 26  Temp:      TempSrc:      SpO2: 96% 97% 100%     Vitals:   02/10/20 0730 02/10/20 0900 02/10/20 0930 02/10/20 1008  BP: 136/77 131/72 105/85 (!) 139/91  Pulse: 83 77 93   Resp: 19 19 19  (!) 26  Temp:      TempSrc:      SpO2: 96% 97% 100%      Constitutional: In distress from pain otherwise pleasant Eyes: PERRL, EOMI ENMT: Mucous membranes are moist. Posterior pharynx clear of any exudate or lesions.Normal dentition.  Neck: normal, supple, no masses, no thyromegaly Respiratory: clear to auscultation bilaterally, no wheezing, no crackles. Normal respiratory effort. No accessory muscle use.  Cardiovascular: Tachycardic rate and irregular rhythm, + systolic murmurs , no rubs / gallops.  Trace bilateral lower extreme edema. 2+ pedal pulses. No carotid bruits.  Abdomen: no tenderness, no masses palpated. No hepatosplenomegaly. Bowel sounds positive.  Musculoskeletal: no clubbing / cyanosis. No joint deformity upper and lower extremities. Good ROM, no contractures. Normal muscle tone.  Skin: no rashes, lesions, ulcers. No induration Neurologic: CN 2-12 grossly intact.  Strength and sensation grossly intact Psychiatric: Emotionally labile but suspect due  to pain, otherwise alert and oriented   Labs on Admission: I have personally reviewed following labs and imaging studies  CBC: Recent Labs  Lab 02/10/20 0026  WBC 10.6*  HGB 8.8*  HCT 28.4*  MCV 89.6  PLT 99991111*   Basic Metabolic Panel: Recent Labs  Lab 02/10/20 0026  NA 138  K 4.3  CL 111  CO2 18*  GLUCOSE 105*  BUN 26*  CREATININE 1.39*  CALCIUM 8.9   GFR: Estimated Creatinine Clearance: 19.3 mL/min (A) (by C-G formula based on SCr of 1.39 mg/dL (H)). Liver Function Tests: Recent Labs  Lab 02/10/20 0026  AST 24  ALT 15  ALKPHOS 101  BILITOT 0.6  PROT 7.8  ALBUMIN 3.6   Recent Labs  Lab 02/10/20 0026  LIPASE 98*   No results for input(s): AMMONIA in the last 168 hours. Coagulation Profile: No results for input(s): INR, PROTIME in the last 168 hours. Cardiac Enzymes: No results for input(s): CKTOTAL, CKMB, CKMBINDEX, TROPONINI in the last 168 hours. BNP (last 3 results) No results for input(s): PROBNP in the last 8760 hours. HbA1C: No results for input(s): HGBA1C in the last 72 hours. CBG: No results for input(s): GLUCAP in the last 168 hours. Lipid Profile: No results for input(s): CHOL, HDL, LDLCALC, TRIG, CHOLHDL, LDLDIRECT in the last 72 hours. Thyroid Function Tests: No results for input(s): TSH, T4TOTAL, FREET4, T3FREE, THYROIDAB in the last 72 hours. Anemia Panel: No results for input(s): VITAMINB12, FOLATE, FERRITIN, TIBC, IRON, RETICCTPCT in the last 72 hours. Urine analysis:    Component Value Date/Time   COLORURINE YELLOW 02/09/2020 2142   APPEARANCEUR TURBID (A) 02/09/2020 2142   LABSPEC 1.025 02/09/2020 2142   PHURINE 6.0 02/09/2020 2142   GLUCOSEU NEGATIVE 02/09/2020 2142   HGBUR LARGE (A) 02/09/2020 2142   BILIRUBINUR NEGATIVE 02/09/2020 2142   KETONESUR TRACE (A) 02/09/2020 2142   PROTEINUR >300 (A) 02/09/2020 2142   UROBILINOGEN 0.2 07/03/2014 0313   NITRITE POSITIVE (A) 02/09/2020 2142   LEUKOCYTESUR MODERATE (A) 02/09/2020  2142    Radiological Exams on Admission: CT ABDOMEN PELVIS W CONTRAST  Result Date: 02/10/2020 CLINICAL DATA:  Generalized lower abdominal pain intermittently for 3 years. History  of UTI. EXAM: CT ABDOMEN AND PELVIS WITH CONTRAST TECHNIQUE: Multidetector CT imaging of the abdomen and pelvis was performed using the standard protocol following bolus administration of intravenous contrast. CONTRAST:  57mL OMNIPAQUE IOHEXOL 300 MG/ML  SOLN COMPARISON:  10/13/2018 CT abdomen/pelvis. FINDINGS: Lower chest: No significant pulmonary nodules or acute consolidative airspace disease. Right coronary atherosclerosis. Hepatobiliary: Normal liver size. No liver mass. Normal gallbladder with no radiopaque cholelithiasis. No biliary ductal dilatation. Pancreas: Normal, with no mass or duct dilation. Spleen: Normal size spleen. Low-attenuation subcentimeter inferior splenic lesion is unchanged and too small to characterize, considered benign. No new splenic lesions. Adrenals/Urinary Tract: Normal adrenals. Chronic moderate left hydroureteronephrosis to the level of the upper left pelvic ureter with worsening urothelial wall thickening and hyperenhancement in the dilated portion of the upper left urinary tract. Asymmetrically delayed left contrast nephrogram. No right hydronephrosis. Subcentimeter hypodense posterior interpolar right renal cortical lesion is too small to characterize and is unchanged, considered benign. No additional renal lesions. Diffuse bladder wall thickening with gas in the nondependent bladder lumen. There is loss of the normal fat plane between the sigmoid colon and left superior bladder wall with intervening gas and soft tissue most compatible with a chronic colovesical fistula, unchanged from prior CT (series 4/image 53). Stomach/Bowel: Large hiatal hernia. Partially visualized stomach is nondistended and otherwise normal. Normal caliber small bowel with no small bowel wall thickening. Appendectomy.  Oral contrast transits to the rectum. Moderate diffuse colonic diverticulosis. There is marked wall thickening and mucosal hyperenhancement throughout sigmoid colon with some associated pericolonic fat stranding, not substantially changed from 05/26/2019 CT. Evidence of chronic colovesical fistula between sigmoid colon and left superior bladder wall as detailed above. No discrete drainable pericolonic abscess on today's scan. Vascular/Lymphatic: Atherosclerotic nonaneurysmal abdominal aorta. Patent portal, splenic and renal veins. No pathologically enlarged lymph nodes in the abdomen or pelvis. Reproductive: Complex lobulated 6.2 x 4.7 cm cystic left adnexal mass with mixed internal cystic and soft tissue density (series 2/image 50), previously 6.3 x 5.2 cm, not substantially changed. No right adnexal mass. Unremarkable uterus. Other: No ascites. Musculoskeletal: No aggressive appearing focal osseous lesions. Moderate thoracolumbar spondylosis. IMPRESSION: 1. Chronic severe diverticular disease in the sigmoid colon with chronic colovesical fistula. Underlying bladder neoplasm cannot be excluded by imaging, however is less favored given the chronicity of this process. No discrete drainable pericolonic abscess on today's scan. No bowel obstruction. 2. Chronic left urinary tract stricture at the level of the upper left pelvic ureter with moderate left hydroureteronephrosis, presumably due to extrinsic involvement by the chronic inflammatory diverticular process in the sigmoid colon. Worsening urothelial wall thickening and hyperenhancement in the dilated upper left urinary tract, suggesting ascending left urinary tract infection. 3. Diffuse bladder wall thickening with bladder gas, suggesting active cystitis in this patient with colovesical fistula. 4. Stable complex lobulated 6.2 cm cystic left adnexal mass, indeterminate for cystic ovarian neoplasm. 5. Large hiatal hernia. 6. Coronary atherosclerosis. 7. Aortic  Atherosclerosis (ICD10-I70.0). Electronically Signed   By: Ilona Sorrel M.D.   On: 02/10/2020 10:31   DG Chest Port 1 View  Result Date: 02/10/2020 CLINICAL DATA:  Shortness of breath this morning EXAM: PORTABLE CHEST 1 VIEW COMPARISON:  09/12/2019 FINDINGS: Chronic enlarged cardiopericardial size accentuated by a large hiatal hernia. There is no edema, consolidation, effusion, or pneumothorax. Lung volumes are low IMPRESSION: 1. Cardiomegaly without failure. 2. Hiatal hernia. Electronically Signed   By: Monte Fantasia M.D.   On: 02/10/2020 06:43    EKG: Independently  reviewed.   Assessment/Plan Zharick Galdo is a 86 y.o. female with medical history significant of atrial fibrillation (refused anticoagulation), aortic stenosis, iron deficiency anemia, HTN, HLD, hypothyroidism, rheumatoid arthritis, colovesical fistula (declined surgery) who presents with acute on chronic worsening abdominal pain and urinary discomfort 2/2 acute cystitis.  #Acute cystitis and possible L sided pyelo #Chronic colovesical fistula #Chronic left urinary tract stricture complicated by left hydroureteronephrosis -The CT of her abdomen pelvis reveals previously noted fistula, impression of underlying bladder neoplasm cannot be excluded.  Bladder wall does show thickening with gas. -Many other concerning abnormalities are chronic in nature including the hydronephrosis, fistula.  Patient again declines procedural evaluation.  Her renal function is largely stable.  She is presently in pain and does not want further intervention. -At this time given connection of the GI tract will cover empirically with Cefepime and Flagyl and follow-up urine culture -Perhaps tomorrow given the chronicity of her complaints would recommend again discussing with patient having urology evaluate given her anatomy is likely going to make current presentation more frequent; she previously declined procedures including stent and presently does  not want procedures -Dr. Jeffie Pollock has previously consulted - d/w patient and Urology in AM  #Severe aortic stenosis #Permanent atrial fibrillation with RVR #Suspected CAD #Chronic systolic heart failure -Patient has previously declined anticoagulation and given age and her general goals of care this is reasonable.  She declines multiple medications and procedures.  She is previously discussed this with cardiology.  Cardiology has evaluated her aortic stenosis previously it was felt to be severe aortic stenosis, suspected low-flow low gradient.  Suspect RVR rate now is mediated more by pain. -Echo November 2020 reveals EF of 35 to 40% -Continue metoprolol, aspirin, held Lasix presently -Patient to follow-up with cardiology, question her desire to pursue TAVR given larger goals of care but it is recommended -Monitor on telemetry and monitor electrolytes  #Normocytic anemia -Hemoglobin 8.8, previously 11.0, no reports of bleeding -Suspect this is combination of anemia of chronic renal disease and anemia of chronic disease as well as iron deficiency anemia given elevated RDW and prior iron profile -Patient denies any bleeding, will monitor -We will repeat iron panel and consider starting iron supplementation  #Stable 6.2 cm left adnexal mass -Indeterminate for cystic ovarian neoplasm, consider pelvic ultrasound -Could consider tumor markers pending patient goals of care  # CKD -Renal function at recent baseline -Monitor I's and O's and avoid nephrotoxic agents  #Hypothyroidism -Continue levothyroxine  #Peripheral neuropathy -Continue gabapentin  #History of rheumatoid arthritis - no acute treatment, had previously been on infliximab and attributes a lot of her current problems to this medication  #Goals of care -Patient seems to wish limited interventions and has has not followed up recently with specialist.  She is previously been DNR/DNI but wishes to be full code as she wants to  discuss her CODE STATUS with her sons in the past her first  DVT prophylaxis: Heparin subcutaneous Code Status: Full code-does not want to do DNR/DNI this admission until discussion with her sons and pastor Admission status: Telemetry   Truddie Hidden MD [facility] Pager [phone]  If 7PM-7AM, please contact night-coverage [email or web address] Password [email or web address]  02/10/2020, 11:01 AM

## 2020-02-10 NOTE — Progress Notes (Signed)
Pharmacy Antibiotic Note  [name] is a 86 y.o. female admitted on 02/10/2020 with intra-abdominal infection.  Patient with history of a colovesicular fistula and reports stool in her bladder.   Patient received Cefepime 2gm IV x 1 dose in the ED today.  Upon admission, Zosyn per RX ordered for intra-abdominal infection. Patient with noted PCN allergy (rash).  Contacted Dr Andria Frames and received order to d/c Zosyn order and for pharmacy to dose Cefepime and Metronidazole.    Plan: - D/C Zosyn protocol per discussion with Dr Andria Frames - Cefepime 2gm IV q24h - Metronidazole 500mg  IV q8h - Follow renal function     Temp (24hrs), Avg:98.3 F (36.8 C), Min:98.3 F (36.8 C), Max:98.3 F (36.8 C)  Recent Labs  Lab 02/10/20 0026  WBC 10.6*  CREATININE 1.39*    Estimated Creatinine Clearance: 19.3 mL/min (A) (by C-G formula based on SCr of 1.39 mg/dL (H)).    Allergies  Allergen Reactions  . Infliximab Other (See Comments)    REACTION: "Enlarged intestines and hernia. Also pushed intestines on lower part of lungs." REACTION: "Enlarged intestines and hernia. Also pushed intestines on lower part of lungs."  . Aspirin Nausea And Vomiting    Low Dose is ok Stomach problems  . Carvedilol Other (See Comments)    Fatigue/ ill Fatigue/ ill  . Codeine Nausea And Vomiting    "Deathly Sick"  . Penicillins Rash    Has patient had a PCN reaction causing immediate rash, facial/tongue/throat swelling, SOB or lightheadedness with hypotension: Y Has patient had a PCN reaction causing severe rash involving mucus membranes or skin necrosis: Y Has patient had a PCN reaction that required hospitalization: N Has patient had a PCN reaction occurring within the last 10 years: N If all of the above answers are "NO", then may proceed with Cephalosporin use.     Antimicrobials this admission: 4/13 Cefepime >>   4/13 Metronidazole >>    Dose adjustments this admission:    Microbiology  results: 4/13 Covid-19: negative 4/12 UCx: sent   Thank you for allowing pharmacy to be a part of this patient's care.  Everette Rank, PharmD 02/10/2020 3:07 PM

## 2020-02-10 NOTE — ED Provider Notes (Signed)
[facility] DEPT Provider Note   CSN: [id number] Arrival date & time: 02/09/20  2120     History Chief Complaint  Patient presents with  . Abdominal Pain    [name] is a 86 y.o. female.  The history is provided by the patient.  Abdominal Pain Pain location:  Suprapubic Pain quality: aching   Pain radiates to:  Does not radiate Pain severity:  Moderate Onset quality:  Gradual Duration: 3 years. Timing:  Constant Progression:  Unchanged Chronicity:  Chronic Relieved by:  Nothing Worsened by:  Movement and palpation Associated symptoms: dysuria and shortness of breath   Associated symptoms: no chest pain, no cough, no fever, no hematemesis, no hematochezia and no vomiting   Patient with history atrial fibrillation, hypertension presents with abdominal pain.  She reports has had this pain for up to 3 years.  She reports painful urination.  She reports she has stool in her bladder and this is causes pain. No fevers or vomiting.  No chest pain.  She does report recent shortness of breath.     Past Medical History:  Diagnosis Date  . A-fib ([place])   . Aortic stenosis, moderate    last echo in 2010; AVA 1.1; mild AS per echo in June 2013  . Edema of both legs    s/p laser treatment per Dr. Donnetta Hutching  . Hiatal hernia   . History of chicken pox   . HLD (hyperlipidemia)   . HTN (hypertension)   . Hypothyroidism   . IBS (irritable bowel syndrome)   . Iron deficiency anemia   . Neuropathic pain 09/06/2015  . Rheumatoid arthritis [facility])     Patient Active Problem List   Diagnosis Date Noted  . Aortic atherosclerosis ([place]) 09/16/2019  . Acute CHF (congestive heart failure) ([place]) 09/02/2019  . UTI (urinary tract infection) 08/21/2019  . Colovesical fistula 05/28/2019  . Polyneuropathy 11/06/2018  . Goals of care, counseling/discussion   . Hydronephrosis 10/13/2018  . Ovarian mass 10/13/2018  . C. difficile colitis 10/13/2018  .  Clostridium difficile colitis 09/24/2018  . Hypokalemia   . Hypomagnesemia   . Left ovarian cyst   . Acute diverticulitis 09/07/2018  . Leucocytosis 09/06/2018  . A-fib ([place]) 09/06/2018  . Malnutrition of moderate degree 07/26/2018  . Perforation of sigmoid colon due to diverticulitis 07/25/2018  . Thrombocytosis ([place]) 07/25/2018  . Sepsis ([place]) 07/24/2018  . Generalized abdominal pain 02/06/2018  . Paresthesia of both lower extremities 02/06/2018  . Chronic kidney disease (CKD), stage III (moderate) 01/07/2018  . Normocytic normochromic anemia 01/07/2018  . Hyperglycemia 01/07/2018  . Iron deficiency 09/19/2017  . Pyoderma gangrenosum 01/04/2017  . Primary osteoarthritis of both hands 01/04/2017  . Primary osteoarthritis of both feet 01/04/2017  . Primary osteoarthritis of both knees 01/04/2017  . Degenerative joint disease involving multiple joints 01/04/2017  . Neuropathic pain 09/06/2015  . Erythema nodosum 09/21/2014  . Rheumatoid arthritis ([place]) 09/21/2014  . Essential hypertension 09/02/2014  . Rectal bleeding 05/15/2014  . Atrial fibrillation, chronic 05/11/2014  . HLD (hyperlipidemia) 04/02/2014  . Hypothyroidism 04/02/2014  . PAC (premature atrial contraction) 01/23/2014  . Near syncope 01/22/2014  . Varicose veins of lower extremities with other complications [id number]  . Localized edema 06/21/2012  . Venous insufficiency of both lower extremities 04/19/2012  . Leg edema 01/17/2012  . Anxiety 11/05/2011  . Aortic stenosis 03/22/2011    Past Surgical History:  Procedure Laterality Date  . APPENDECTOMY  1955  . CARPAL  TUNNEL RELEASE Right   . CATARACT EXTRACTION    . ENDOSCOPIC VEIN LASER TREATMENT    . ENDOVENOUS ABLATION SAPHENOUS VEIN W/ LASER  08-29-2012   left greater saphenous vein   Sherren Mocha Early MD  . ENDOVENOUS ABLATION SAPHENOUS VEIN W/ LASER  09-19-2012   right greater saphenous vein by Curt Jews MD  . EYE SURGERY  2005   Bilateral cataract  .  FOOT SURGERY  2003   bilateral Hammer Toe  . stab phlebectomy Right 01-02-2013   10-15 incisions right thigh and calf by Curt Jews MD     OB History   No obstetric history on file.     Family History  Problem Relation Age of Onset  . Heart disease Mother   . Peripheral vascular disease Mother   . Heart disease Father   . Peripheral vascular disease Father        Right leg amputation  . COPD Father   . Heart disease Brother 80       Heart Disease before age 74  . Heart attack Brother   . Peripheral vascular disease Other   . Hypertension Sister     Social History   Tobacco Use  . Smoking status: Former Smoker    Types: Cigarettes    Quit date: 10/31/1983    Years since quitting: 36.3  . Smokeless tobacco: Never Used  Substance Use Topics  . Alcohol use: No  . Drug use: No    Home Medications Prior to Admission medications   Medication Sig Start Date End Date Taking? Authorizing Provider  acetaminophen (TYLENOL) 500 MG tablet Take 1,000 mg by mouth every 8 (eight) hours as needed (joint pain).     [provider]  ALPRAZolam Duanne Moron) 0.25 MG tablet TAKE 1 TABLET BY MOUTH ONCE DAILY AS NEEDED FOR ANXIETY 01/26/20   Nche, Charlene Brooke, NP  aspirin 81 MG chewable tablet Chew 1 tablet (81 mg total) by mouth daily. 10/26/18   Hennie Duos, MD  furosemide (LASIX) 20 MG tablet Take 1 tablet (20 mg total) by mouth daily. No additional refills without appt with nephrology and cardiology 10/21/19   Nche, Charlene Brooke, NP  gabapentin (NEURONTIN) 100 MG capsule Take 1 capsule (100 mg total) by mouth at bedtime. 06/30/19   Nche, Charlene Brooke, NP  irbesartan (AVAPRO) 75 MG tablet Take 0.5 tablets (37.5 mg total) by mouth daily. 09/06/19   Raiford Noble Latif, DO  levothyroxine (SYNTHROID) 75 MCG tablet TAKE 1 TABLET BY MOUTH ONCE DAILY BEFORE BREAKFAST 10/21/19   Nche, Charlene Brooke, NP  metoprolol succinate (TOPROL-XL) 25 MG 24 hr tablet Take 3 tablets (75 mg total) by  mouth daily. 06/30/19   Nche, Charlene Brooke, NP  Multiple Vitamin (MULTIVITAMIN WITH MINERALS) TABS tablet Take 1 tablet by mouth daily. 09/27/18   Raiford Noble Latif, DO  Olopatadine HCl 0.2 % SOLN Place 1 drop into both eyes daily. Patient taking differently: Place 1 drop into both eyes 4 (four) times daily.  10/26/18   Hennie Duos, MD    Allergies    Infliximab, Aspirin, Carvedilol, Codeine, and Penicillins  Review of Systems   Review of Systems  Constitutional: Negative for fever.  Respiratory: Positive for shortness of breath. Negative for cough.   Cardiovascular: Negative for chest pain.  Gastrointestinal: Positive for abdominal pain. Negative for hematemesis, hematochezia and vomiting.  Genitourinary: Positive for dysuria.  All other systems reviewed and are negative.   Physical Exam Updated Vital Signs BP Marland Kitchen)  148/80 (BP Location: Right Arm)   Pulse 82   Temp 98.3 F (36.8 C) (Oral)   Resp 19   SpO2 96%   Physical Exam CONSTITUTIONAL: Well developed/well nourished HEAD: Normocephalic/atraumatic EYES: EOMI ENMT: Mucous membranes moist NECK: supple no meningeal signs SPINE/BACK:entire spine nontender CV: S1/S2 noted LUNGS: Lungs are clear to auscultation bilaterally, no apparent distress ABDOMEN: soft, moderate suprapubic tenderness, no rebound or guarding, bowel sounds noted throughout abdomen Rectal-stool is black, no blood.  Female chaperone present GU:no cva tenderness NEURO: Pt is awake/alert/appropriate, moves all extremitiesx4.  No facial droop.  EXTREMITIES: pulses normal/equal, full ROM SKIN: warm, color normal PSYCH: no abnormalities of mood noted, alert and oriented to situation  ED Results / Procedures / Treatments   Labs (all labs ordered are listed, but only abnormal results are displayed) Labs Reviewed  LIPASE, BLOOD - Abnormal; Notable for the following components:      Result Value   Lipase 98 (*)    All other components within normal  limits  COMPREHENSIVE METABOLIC PANEL - Abnormal; Notable for the following components:   CO2 18 (*)    Glucose, Bld 105 (*)    BUN 26 (*)    Creatinine, Ser 1.39 (*)    GFR calc non Af Amer 33 (*)    GFR calc Af Amer 39 (*)    All other components within normal limits  CBC - Abnormal; Notable for the following components:   WBC 10.6 (*)    RBC 3.17 (*)    Hemoglobin 8.8 (*)    HCT 28.4 (*)    RDW 17.6 (*)    Platelets 423 (*)    All other components within normal limits  SARS CORONAVIRUS 2 (TAT 6-24 HRS)  URINALYSIS, ROUTINE W REFLEX MICROSCOPIC  POC OCCULT BLOOD, ED    EKG EKG Interpretation  Date/Time:  Monday February 09 2020 21:42:22 EDT Ventricular Rate:  80 PR Interval:    QRS Duration: 94 QT Interval:  374 QTC Calculation: 432 R Axis:   69 Text Interpretation: Atrial fibrillation Low voltage, precordial leads Probable anteroseptal infarct, old Borderline T abnormalities, inferior leads Confirmed by Ripley Fraise [phone]) on 02/10/2020 6:27:09 AM   Radiology DG Chest Port 1 View  Result Date: 02/10/2020 CLINICAL DATA:  Shortness of breath this morning EXAM: PORTABLE CHEST 1 VIEW COMPARISON:  09/12/2019 FINDINGS: Chronic enlarged cardiopericardial size accentuated by a large hiatal hernia. There is no edema, consolidation, effusion, or pneumothorax. Lung volumes are low IMPRESSION: 1. Cardiomegaly without failure. 2. Hiatal hernia. Electronically Signed   By: Monte Fantasia M.D.   On: 02/10/2020 06:43    Procedures Procedures   Medications Ordered in ED Medications  sodium chloride flush (NS) 0.9 % injection 3 mL (has no administration in time range)  fentaNYL (SUBLIMAZE) injection 50 mcg (has no administration in time range)  ondansetron (ZOFRAN) injection 4 mg (has no administration in time range)    ED Course  I have reviewed the triage vital signs and the nursing notes.  Pertinent labs & imaging results that were available during my care of the patient  were reviewed by me and considered in my medical decision making (see chart for details).    MDM Rules/Calculators/A&P                      6:54 AM Patient presents with suprapubic abdominal pain.  She reports has had this for some time, and reports she has stool in her bladder.  She apparently has a history of a colovesicular fistula. Patient does have evidence of worsening anemia, but denies any recent blood loss.   This patient presents to the ED for concern of abdominal pain, this involves an extensive number of treatment options, and is a complaint that carries with it a high risk of complications and morbidity.  The differential diagnosis includes intestinal perforation, intra-abdominal abscess, UTI   Lab Tests:   I Ordered, reviewed, and interpreted labs, which included Hemoccult, blood count, metabolic panel, urinalysis  Medicines ordered:   I ordered medication fentanyl for pain  Imaging Studies ordered:   I ordered imaging studies which included CT abdomen pelvis and chest xray, chest x-ray is negative   Additional history obtained:     Previous records obtained and reviewed    Reevaluation:  After the interventions stated above, I reevaluated the patient and found stable  .  7:12 AM Patient with history of colovesicular fistula presented with increasing abdominal pain.  She has significant lower abdominal tenderness. Signed out to dr Francia Greaves with ct imaging pending  Final Clinical Impression(s) / ED Diagnoses Final diagnoses:  None    Rx / DC Orders ED Discharge Orders    None       Ripley Fraise, MD 02/10/20 [phone]

## 2020-02-10 NOTE — ED Notes (Signed)
RN attempted to assess patient's plan of care and was unable to understand a clear course for patient's request.  Patient reports she does not want to have antibiotics as they make her sick and does not want surgery to fix her fistula due to her age. Patient reports she has fecal matter coming from her vagina. Patient keeps saying "I just want to stay with my sons and be comfortable until the good Lord takes me home"

## 2020-02-10 NOTE — ED Notes (Signed)
Rn attempted to call both of patient's emergency contacts to give an update without success.

## 2020-02-11 DIAGNOSIS — R109 Unspecified abdominal pain: Secondary | ICD-10-CM

## 2020-02-11 DIAGNOSIS — N3090 Cystitis, unspecified without hematuria: Secondary | ICD-10-CM | POA: Diagnosis not present

## 2020-02-11 LAB — CBC
HCT: 24.4 % — ABNORMAL LOW (ref 36.0–46.0)
Hemoglobin: 7.3 g/dL — ABNORMAL LOW (ref 12.0–15.0)
MCH: 27.5 pg (ref 26.0–34.0)
MCHC: 29.9 g/dL — ABNORMAL LOW (ref 30.0–36.0)
MCV: 92.1 fL (ref 80.0–100.0)
Platelets: 308 10*3/uL (ref 150–400)
RBC: 2.65 MIL/uL — ABNORMAL LOW (ref 3.87–5.11)
RDW: 17.9 % — ABNORMAL HIGH (ref 11.5–15.5)
WBC: 10.1 10*3/uL (ref 4.0–10.5)
nRBC: 0 % (ref 0.0–0.2)

## 2020-02-11 LAB — RETICULOCYTES
Immature Retic Fract: 26.8 % — ABNORMAL HIGH (ref 2.3–15.9)
RBC.: 2.68 MIL/uL — ABNORMAL LOW (ref 3.87–5.11)
Retic Count, Absolute: 76.9 10*3/uL (ref 19.0–186.0)
Retic Ct Pct: 2.9 % (ref 0.4–3.1)

## 2020-02-11 LAB — IRON AND TIBC
Iron: 25 ug/dL — ABNORMAL LOW (ref 28–170)
Saturation Ratios: 8 % — ABNORMAL LOW (ref 10.4–31.8)
TIBC: 302 ug/dL (ref 250–450)
UIBC: 277 ug/dL

## 2020-02-11 LAB — BASIC METABOLIC PANEL
Anion gap: 7 (ref 5–15)
BUN: 28 mg/dL — ABNORMAL HIGH (ref 8–23)
CO2: 17 mmol/L — ABNORMAL LOW (ref 22–32)
Calcium: 8.3 mg/dL — ABNORMAL LOW (ref 8.9–10.3)
Chloride: 112 mmol/L — ABNORMAL HIGH (ref 98–111)
Creatinine, Ser: 1.67 mg/dL — ABNORMAL HIGH (ref 0.44–1.00)
GFR calc Af Amer: 31 mL/min — ABNORMAL LOW (ref >60)
GFR calc non Af Amer: 27 mL/min — ABNORMAL LOW (ref >60)
Glucose, Bld: 108 mg/dL — ABNORMAL HIGH (ref 70–99)
Potassium: 5 mmol/L (ref 3.5–5.1)
Sodium: 136 mmol/L (ref 135–145)

## 2020-02-11 LAB — VITAMIN B12: Vitamin B-12: 1388 pg/mL — ABNORMAL HIGH (ref 180–914)

## 2020-02-11 LAB — FERRITIN: Ferritin: 34 ng/mL (ref 11–307)

## 2020-02-11 LAB — GLUCOSE, CAPILLARY: Glucose-Capillary: 101 mg/dL — ABNORMAL HIGH (ref 70–99)

## 2020-02-11 LAB — FOLATE: Folate: 21.8 ng/mL (ref >5.9)

## 2020-02-11 MED ORDER — MAGNESIUM OXIDE 400 (241.3 MG) MG PO TABS
400.0000 mg | ORAL_TABLET | Freq: Two times a day (BID) | ORAL | Status: AC
  Administered 2020-02-11 – 2020-02-12 (×2): 400 mg via ORAL
  Filled 2020-02-11 (×2): qty 1

## 2020-02-11 MED ORDER — SODIUM CHLORIDE 0.9 % IV SOLN: INTRAVENOUS | Status: DC

## 2020-02-11 NOTE — Evaluation (Signed)
Occupational Therapy Evaluation Patient Details Name: [name] MRN: [id number] DOB: [birth year] Today's Date: 02/11/2020    History of Present Illness 86 yo female admitted with cystitis. Hx of hypothyroidism, anemia, RA, CKD, A fib, aortic stenosis, neuropathic pain   Clinical Impression   Pt admitted with the above diagnoses and presents with below problem list. Pt will benefit from continued acute OT to address the below listed deficits and maximize independence with basic ADLs prior to d/c home. PTA pt was independent with basic ADLs, holds onto someone's arm vs cart for external support for mobility outside the home. Pt currently min A with UB/LB ADLs, toilet transfers, and functional mobility. DOE 2/4 standing EOB and reports onset of SOB with minimal activity. Pt lives with her son and plans to d/c home. OT to follow acutely.      Follow Up Recommendations  Home health OT;Supervision/Assistance - 24 hour    Equipment Recommendations  None recommended by OT    Recommendations for Other Services       Precautions / Restrictions Precautions Precautions: Fall Restrictions Weight Bearing Restrictions: No      Mobility Bed Mobility Overal bed mobility: Needs Assistance Bed Mobility: Supine to Sit;Sit to Supine     Supine to sit: Min guard Sit to supine: Min guard   General bed mobility comments: min guard for safety  Transfers Overall transfer level: Needs assistance Equipment used: 1 person hand held assist Transfers: Sit to/from Stand Sit to Stand: Min assist         General transfer comment: light steadying assist. to/from EOB. pt declined rw "I don't like those things".    Balance Overall balance assessment: Needs assistance         Standing balance support: Bilateral upper extremity supported;Single extremity supported Standing balance-Leahy Scale: Poor Standing balance comment: reliant on UE support                            ADL either performed or assessed with clinical judgement   ADL Overall ADL's : Needs assistance/impaired Eating/Feeding: Set up;Sitting   Grooming: Set up;Sitting   Upper Body Bathing: Set up;Sitting;Minimal assistance   Lower Body Bathing: Minimal assistance;Sit to/from stand   Upper Body Dressing : Minimal assistance;Sitting   Lower Body Dressing: Minimal assistance;Sit to/from stand   Toilet Transfer: Minimal assistance   Toileting- Clothing Manipulation and Hygiene: Minimal assistance         General ADL Comments: Pt completed bed mobility, sat EOB, then stood from EOB. Pt DOE 2/4 standing EOB.     Vision         Perception     Praxis      Pertinent Vitals/Pain Pain Assessment: Faces Pain Score: 5  Faces Pain Scale: Hurts little more Pain Location: bil lower legs (chronic per pt) Pain Descriptors / Indicators: Grimacing;Sore Pain Intervention(s): Monitored during session;Repositioned     Hand Dominance Right   Extremity/Trunk Assessment Upper Extremity Assessment Upper Extremity Assessment: Generalized weakness   Lower Extremity Assessment Lower Extremity Assessment: Defer to PT evaluation   Cervical / Trunk Assessment Cervical / Trunk Assessment: Kyphotic   Communication Communication Communication: No difficulties   Cognition Arousal/Alertness: Awake/alert Behavior During Therapy: WFL for tasks assessed/performed Overall Cognitive Status: Within Functional Limits for tasks assessed  General Comments       Exercises     Shoulder Instructions      Home Living Family/patient expects to be discharged to:: Private residence Living Arrangements: Children(son) Available Help at Discharge: Family;Available 24 hours/day Type of Home: House Home Access: Level entry     Home Layout: One level     Bathroom Shower/Tub: Door;Walk-in Psychologist, prison and probation services: Handicapped height Bathroom  Accessibility: Yes   Home Equipment: Environmental consultant - 2 wheels;Shower seat - built in          Prior Functioning/Environment Level of Independence: Needs assistance  Gait / Transfers Assistance Needed: walked community distances holding onto son or cart ADL's / Homemaking Assistance Needed: independent with bath, dress, feed; son does cook, clean, laundry, grocery shopping Communication / Swallowing Assistance Needed: no difficulties Comments: no longer drives        OT Problem List: Decreased strength;Decreased activity tolerance;Impaired balance (sitting and/or standing);Decreased knowledge of use of DME or AE;Decreased knowledge of precautions;Cardiopulmonary status limiting activity;Pain      OT Treatment/Interventions: Self-care/ADL training;Energy conservation;DME and/or AE instruction;Therapeutic activities;Patient/family education;Balance training    OT Goals(Current goals can be found in the care plan section) Acute Rehab OT Goals Patient Stated Goal: feel better, stop getting SOB, go home, values her independence OT Goal Formulation: With patient Time For Goal Achievement: 02/25/20 Potential to Achieve Goals: Good ADL Goals Pt Will Perform Grooming: with set-up;sitting Pt Will Perform Upper Body Bathing: with set-up;sitting Pt Will Perform Lower Body Bathing: with supervision;sit to/from stand Pt Will Transfer to Toilet: with supervision;ambulating Pt Will Perform Toileting - Clothing Manipulation and hygiene: with supervision;sit to/from stand  OT Frequency: Min 2X/week   Barriers to D/C:            Co-evaluation              AM-PAC OT "6 Clicks" Daily Activity     Outcome Measure Help from another person eating meals?: None Help from another person taking care of personal grooming?: None Help from another person toileting, which includes using toliet, bedpan, or urinal?: A Little Help from another person bathing (including washing, rinsing, drying)?: A  Little Help from another person to put on and taking off regular upper body clothing?: A Little Help from another person to put on and taking off regular lower body clothing?: A Little 6 Click Score: 20   End of Session    Activity Tolerance: Other (comment);Patient limited by fatigue(DOE 2/4 standing EOB) Patient left: in bed;with call bell/phone within reach;with bed alarm set  OT Visit Diagnosis: Unsteadiness on feet (R26.81);Muscle weakness (generalized) (M62.81);Pain                Time: [id number] OT Time Calculation (min): 22 min Charges:  OT General Charges $OT Visit: 1 Visit OT Evaluation $OT Eval Low Complexity: [place], OT Acute Rehabilitation Services Pager: [phone] Office: [phone]   Hortencia Pilar 02/11/2020, 2:04 PM

## 2020-02-11 NOTE — Consult Note (Addendum)
Urology Consult   Physician requesting consult: Dr Darrick Meigs  Reason for consult: Colovesical fistula, abdominal pain  History of Present Illness: [name] is a 86 y.o. female well-known to our office, followed by Dr. Irine Seal.  She has history of significant diverticular disease, a colonic mass, with subsequent left hydronephrosis and a colovesical fistula.  She is most symptomatic with frequent urinary tract infections, dysuria, frequency, occasional pneumaturia and feculent urine.  She is seen quite often in our office, most recently earlier this week.  Recent urine culture grew out  A. baumannii, Enterococcus as well as E. coli.  Enterococcus was susceptible to ampicillin, vancomycin, ciprofloxacin and levofloxacin.  The other species together were sensitive to third-generation cephalosporins, Bactrim, Carbapenem's, ciprofloxacin, levofloxacin and gentamicin.  She is recently admitted for shortness of breath, worsening abdominal pain and significant dysuria.  In the emergency room, urine was infected appearing.  White blood cell count was 10,000.  CT abdomen and pelvis revealed chronic severe diverticular disease with chronic colovesical fistula.  There was bladder wall thickening.  There was left hydronephrosis.  Gas was present in the bladder consistent with fistula.  Urologic consultation is requested for further management.  Past Medical History:  Diagnosis Date  . A-fib ([place])   . Aortic stenosis, moderate    last echo in 2010; AVA 1.1; mild AS per echo in June 2013  . Edema of both legs    s/p laser treatment per Dr. Donnetta Hutching  . Hiatal hernia   . History of chicken pox   . HLD (hyperlipidemia)   . HTN (hypertension)   . Hypothyroidism   . IBS (irritable bowel syndrome)   . Iron deficiency anemia   . Neuropathic pain 09/06/2015  . Rheumatoid arthritis [facility])     Past Surgical History:  Procedure Laterality Date  . APPENDECTOMY  1955  . CARPAL TUNNEL RELEASE Right   .  CATARACT EXTRACTION    . ENDOSCOPIC VEIN LASER TREATMENT    . ENDOVENOUS ABLATION SAPHENOUS VEIN W/ LASER  08-29-2012   left greater saphenous vein   Sherren Mocha Early MD  . ENDOVENOUS ABLATION SAPHENOUS VEIN W/ LASER  09-19-2012   right greater saphenous vein by Curt Jews MD  . EYE SURGERY  2005   Bilateral cataract  . FOOT SURGERY  2003   bilateral Hammer Toe  . stab phlebectomy Right 01-02-2013   10-15 incisions right thigh and calf by Curt Jews MD     Current Hospital Medications: Scheduled Meds: . aspirin  81 mg Oral Daily  . gabapentin  100 mg Oral QHS  . heparin  5,000 Units Subcutaneous Q12H  . levothyroxine  75 mcg Oral QAC breakfast  . magnesium oxide  400 mg Oral BID  . metoprolol succinate  75 mg Oral Daily  . multivitamin with minerals  1 tablet Oral Daily  . sodium chloride flush  3 mL Intravenous Once   Continuous Infusions: . sodium chloride 75 mL/hr at 02/11/20 1752  . ceFEPime (MAXIPIME) IV 2 g (02/11/20 1008)  . metronidazole 500 mg (02/11/20 1527)   PRN Meds:.acetaminophen, ALPRAZolam, HYDROmorphone (DILAUDID) injection, ondansetron **OR** ondansetron (ZOFRAN) IV  Allergies:  Allergies  Allergen Reactions  . Infliximab Other (See Comments)    REACTION: "Enlarged intestines and hernia. Also pushed intestines on lower part of lungs." REACTION: "Enlarged intestines and hernia. Also pushed intestines on lower part of lungs."  . Aspirin Nausea And Vomiting    Low Dose is ok Stomach problems  . Carvedilol Other (  See Comments)    Fatigue/ ill Fatigue/ ill  . Codeine Nausea And Vomiting    "Deathly Sick"  . Penicillins Rash    Has patient had a PCN reaction causing immediate rash, facial/tongue/throat swelling, SOB or lightheadedness with hypotension: Y Has patient had a PCN reaction causing severe rash involving mucus membranes or skin necrosis: Y Has patient had a PCN reaction that required hospitalization: N Has patient had a PCN reaction occurring within  the last 10 years: N If all of the above answers are "NO", then may proceed with Cephalosporin use.     Family History  Problem Relation Age of Onset  . Heart disease Mother   . Peripheral vascular disease Mother   . Heart disease Father   . Peripheral vascular disease Father        Right leg amputation  . COPD Father   . Heart disease Brother 75       Heart Disease before age 68  . Heart attack Brother   . Peripheral vascular disease Other   . Hypertension Sister     Social History:  reports that she quit smoking about 36 years ago. Her smoking use included cigarettes. She has never used smokeless tobacco. She reports that she does not drink alcohol or use drugs.  ROS: A complete review of systems was performed.  All systems are negative except for pertinent findings as noted.  Physical Exam:  Vital signs in last 24 hours: Temp:  [98.4 F (36.9 C)-98.5 F (36.9 C)] 98.4 F (36.9 C) (04/14 1517) Pulse Rate:  [76-101] 79 (04/14 1517) Resp:  [17-19] 17 (04/14 0333) BP: (88-112)/(45-81) 108/60 (04/14 1517) SpO2:  [98 %-100 %] 100 % (04/14 1517) General:  Alert and oriented, No acute distress HEENT: Normocephalic, atraumatic Neck: No JVD or lymphadenopathy Cardiovascular: Regular rate  Lungs: Normal inspiratory/expiratory excursion Abdomen: Soft, there was suprapubic and left lower quadrant tenderness.  No rebound or guarding. Back: No CVA tenderness Extremities: No edema Neurologic: Grossly intact  Laboratory Data:  Recent Labs    02/10/20 0026 02/11/20 0549  WBC 10.6* 10.1  HGB 8.8* 7.3*  HCT 28.4* 24.4*  PLT 423* 308    Recent Labs    02/10/20 0026 02/11/20 0549  NA 138 136  K 4.3 5.0  CL 111 112*  GLUCOSE 105* 108*  BUN 26* 28*  CALCIUM 8.9 8.3*  CREATININE 1.39* 1.67*     Results for orders placed or performed during the hospital encounter of 02/10/20 (from the past 24 hour(s))  Basic metabolic panel     Status: Abnormal   Collection Time:  02/11/20  5:49 AM  Result Value Ref Range   Sodium 136 135 - 145 mmol/L   Potassium 5.0 3.5 - 5.1 mmol/L   Chloride 112 (H) 98 - 111 mmol/L   CO2 17 (L) 22 - 32 mmol/L   Glucose, Bld 108 (H) 70 - 99 mg/dL   BUN 28 (H) 8 - 23 mg/dL   Creatinine, Ser 1.67 (H) 0.44 - 1.00 mg/dL   Calcium 8.3 (L) 8.9 - 10.3 mg/dL   GFR calc non Af Amer 27 (L) >60 mL/min   GFR calc Af Amer 31 (L) >60 mL/min   Anion gap 7 5 - 15  CBC     Status: Abnormal   Collection Time: 02/11/20  5:49 AM  Result Value Ref Range   WBC 10.1 4.0 - 10.5 K/uL   RBC 2.65 (L) 3.87 - 5.11 MIL/uL   Hemoglobin 7.3 (  L) 12.0 - 15.0 g/dL   HCT 24.4 (L) 36.0 - 46.0 %   MCV 92.1 80.0 - 100.0 fL   MCH 27.5 26.0 - 34.0 pg   MCHC 29.9 (L) 30.0 - 36.0 g/dL   RDW 17.9 (H) 11.5 - 15.5 %   Platelets 308 150 - 400 K/uL   nRBC 0.0 0.0 - 0.2 %  Glucose, capillary     Status: Abnormal   Collection Time: 02/11/20  7:40 AM  Result Value Ref Range   Glucose-Capillary 101 (H) 70 - 99 mg/dL  Vitamin B12     Status: Abnormal   Collection Time: 02/11/20  4:37 PM  Result Value Ref Range   Vitamin B-12 1,388 (H) 180 - 914 pg/mL  Folate     Status: None   Collection Time: 02/11/20  4:37 PM  Result Value Ref Range   Folate 21.8 >5.9 ng/mL  Iron and TIBC     Status: Abnormal   Collection Time: 02/11/20  4:37 PM  Result Value Ref Range   Iron 25 (L) 28 - 170 ug/dL   TIBC 302 250 - 450 ug/dL   Saturation Ratios 8 (L) 10.4 - 31.8 %   UIBC 277 ug/dL  Ferritin     Status: None   Collection Time: 02/11/20  4:37 PM  Result Value Ref Range   Ferritin 34 11 - 307 ng/mL  Reticulocytes     Status: Abnormal   Collection Time: 02/11/20  4:37 PM  Result Value Ref Range   Retic Ct Pct 2.9 0.4 - 3.1 %   RBC. 2.68 (L) 3.87 - 5.11 MIL/uL   Retic Count, Absolute 76.9 19.0 - 186.0 K/uL   Immature Retic Fract 26.8 (H) 2.3 - 15.9 %   Recent Results (from the past 240 hour(s))  SARS CORONAVIRUS 2 (TAT 6-24 HRS) Nasopharyngeal     Status: None    Collection Time: 02/10/20  6:29 AM   Specimen: Nasopharyngeal  Result Value Ref Range Status   SARS Coronavirus 2 NEGATIVE NEGATIVE Final    Comment: (NOTE) SARS-CoV-2 target nucleic acids are NOT DETECTED. The SARS-CoV-2 RNA is generally detectable in upper and lower respiratory specimens during the acute phase of infection. Negative results do not preclude SARS-CoV-2 infection, do not rule out co-infections with other pathogens, and should not be used as the sole basis for treatment or other patient management decisions. Negative results must be combined with clinical observations, patient history, and epidemiological information. The expected result is Negative. Fact Sheet for Patients: SugarRoll.be Fact Sheet for Healthcare Providers: https://www.woods-mathews.com/ This test is not yet approved or cleared by the [place] FDA and  has been authorized for detection and/or diagnosis of SARS-CoV-2 by FDA under an Emergency Use Authorization (EUA). This EUA will remain  in effect (meaning this test can be used) for the duration of the COVID-19 declaration under Section 56 4(b)(1) of the Act, 21 U.S.C. section 360bbb-3(b)(1), unless the authorization is terminated or revoked sooner. Performed at [facility], [place] [address] [place], [place] [address]     Renal Function: Recent Labs    02/10/20 0026 02/11/20 0549  CREATININE 1.39* 1.67*   Estimated Creatinine Clearance: 16.1 mL/min (A) (by C-G formula based on SCr of 1.67 mg/dL (H)).  Radiologic Imaging: CT ABDOMEN PELVIS W CONTRAST  Result Date: 02/10/2020 CLINICAL DATA:  Generalized lower abdominal pain intermittently for 3 years. History of UTI. EXAM: CT ABDOMEN AND PELVIS WITH CONTRAST TECHNIQUE: Multidetector CT imaging of the abdomen  and pelvis was performed using the standard protocol following bolus administration of intravenous contrast. CONTRAST:  90mL OMNIPAQUE  IOHEXOL 300 MG/ML  SOLN COMPARISON:  10/13/2018 CT abdomen/pelvis. FINDINGS: Lower chest: No significant pulmonary nodules or acute consolidative airspace disease. Right coronary atherosclerosis. Hepatobiliary: Normal liver size. No liver mass. Normal gallbladder with no radiopaque cholelithiasis. No biliary ductal dilatation. Pancreas: Normal, with no mass or duct dilation. Spleen: Normal size spleen. Low-attenuation subcentimeter inferior splenic lesion is unchanged and too small to characterize, considered benign. No new splenic lesions. Adrenals/Urinary Tract: Normal adrenals. Chronic moderate left hydroureteronephrosis to the level of the upper left pelvic ureter with worsening urothelial wall thickening and hyperenhancement in the dilated portion of the upper left urinary tract. Asymmetrically delayed left contrast nephrogram. No right hydronephrosis. Subcentimeter hypodense posterior interpolar right renal cortical lesion is too small to characterize and is unchanged, considered benign. No additional renal lesions. Diffuse bladder wall thickening with gas in the nondependent bladder lumen. There is loss of the normal fat plane between the sigmoid colon and left superior bladder wall with intervening gas and soft tissue most compatible with a chronic colovesical fistula, unchanged from prior CT (series 4/image 53). Stomach/Bowel: Large hiatal hernia. Partially visualized stomach is nondistended and otherwise normal. Normal caliber small bowel with no small bowel wall thickening. Appendectomy. Oral contrast transits to the rectum. Moderate diffuse colonic diverticulosis. There is marked wall thickening and mucosal hyperenhancement throughout sigmoid colon with some associated pericolonic fat stranding, not substantially changed from 05/26/2019 CT. Evidence of chronic colovesical fistula between sigmoid colon and left superior bladder wall as detailed above. No discrete drainable pericolonic abscess on today's  scan. Vascular/Lymphatic: Atherosclerotic nonaneurysmal abdominal aorta. Patent portal, splenic and renal veins. No pathologically enlarged lymph nodes in the abdomen or pelvis. Reproductive: Complex lobulated 6.2 x 4.7 cm cystic left adnexal mass with mixed internal cystic and soft tissue density (series 2/image 50), previously 6.3 x 5.2 cm, not substantially changed. No right adnexal mass. Unremarkable uterus. Other: No ascites. Musculoskeletal: No aggressive appearing focal osseous lesions. Moderate thoracolumbar spondylosis. IMPRESSION: 1. Chronic severe diverticular disease in the sigmoid colon with chronic colovesical fistula. Underlying bladder neoplasm cannot be excluded by imaging, however is less favored given the chronicity of this process. No discrete drainable pericolonic abscess on today's scan. No bowel obstruction. 2. Chronic left urinary tract stricture at the level of the upper left pelvic ureter with moderate left hydroureteronephrosis, presumably due to extrinsic involvement by the chronic inflammatory diverticular process in the sigmoid colon. Worsening urothelial wall thickening and hyperenhancement in the dilated upper left urinary tract, suggesting ascending left urinary tract infection. 3. Diffuse bladder wall thickening with bladder gas, suggesting active cystitis in this patient with colovesical fistula. 4. Stable complex lobulated 6.2 cm cystic left adnexal mass, indeterminate for cystic ovarian neoplasm. 5. Large hiatal hernia. 6. Coronary atherosclerosis. 7. Aortic Atherosclerosis (ICD10-I70.0). Electronically Signed   By: Ilona Sorrel M.D.   On: 02/10/2020 10:31   DG Chest Port 1 View  Result Date: 02/10/2020 CLINICAL DATA:  Shortness of breath this morning EXAM: PORTABLE CHEST 1 VIEW COMPARISON:  09/12/2019 FINDINGS: Chronic enlarged cardiopericardial size accentuated by a large hiatal hernia. There is no edema, consolidation, effusion, or pneumothorax. Lung volumes are low  IMPRESSION: 1. Cardiomegaly without failure. 2. Hiatal hernia. Electronically Signed   By: Monte Fantasia M.D.   On: 02/10/2020 06:43    I independently reviewed the above imaging studies.  Impression/Assessment:  Chronic/severe diverticulitis with colonic mass with sequelae  including left hydronephrosis and chronic colovesical fistula with recent urinary tract infections.  Molecular UTI/PCR report from our office 2 days ago revealed 3 species-E. coli, Enterococcus and  A. Baumannii.  Unfortunately, we do not have much to offer if she does not have surgical management of her disease.  At this point, she does not need drainage of her left renal unit i.e. stent placement or percutaneous nephrostomy tube, as she more than likely does not have upper tract infection.  She should be covered adequately with her metronidazole and cefepime, although cefepime alone should probably cover her urinary tract infection.  However, she obviously may well have a diverticulitis flare as well.  I did offer her a Foley catheter, this would decompress her bladder and decrease her frequency and dysuria.  However, she wants to pass on that for now.  I do not think she needs further urologic management at this point.  Just treat the urinary infection, and she can follow-up on an as-needed basis in our office.

## 2020-02-11 NOTE — Evaluation (Signed)
Physical Therapy Evaluation Patient Details Name: [name] MRN: [id number] DOB: [birth year] Today's Date: 02/11/2020   History of Present Illness  86 yo female admitted with cystitis. Hx of hypothyroidism, anemia, RA, CKD, A fib, aortic stenosis, neuropathic pain  Clinical Impression  Pt admitted with above diagnosis.  Pt currently with functional limitations due to the deficits listed below (see PT Problem List). Pt will benefit from skilled PT to increase their independence and safety with mobility to allow discharge to the venue listed below.  Pt reports chronic pain in lower legs from previous laser treatment surgery and declined ambulating however agreeable to OOB to recliner.  Pt reports her son assists her at home with any needs.  Pt plans to return home on d/c and politely declines HHPT.     Follow Up Recommendations Home health PT;Supervision for mobility/OOB(pt politely declines HHPT, has had many times before and did not find helpful)    Equipment Recommendations  None recommended by PT    Recommendations for Other Services       Precautions / Restrictions Precautions Precautions: Fall Restrictions Weight Bearing Restrictions: No      Mobility  Bed Mobility Overal bed mobility: Needs Assistance Bed Mobility: Supine to Sit     Supine to sit: Min guard     General bed mobility comments: provided a hand for pt to self assist trunk  Transfers Overall transfer level: Needs assistance Equipment used: 1 person hand held assist Transfers: Sit to/from Stand Sit to Stand: Min assist         General transfer comment: light assist to rise and steady; pt declined using RW but needed UE support, provided HHA and pt used armrests of recliner  Ambulation/Gait             General Gait Details: pt declined at this time due to LE pain (she reports chronic)  Stairs            Wheelchair Mobility    Modified Rankin (Stroke Patients Only)        Balance Overall balance assessment: Needs assistance         Standing balance support: Bilateral upper extremity supported Standing balance-Leahy Scale: Poor Standing balance comment: reliant on UE support                             Pertinent Vitals/Pain Pain Assessment: 0-10 Pain Score: 5  Pain Location: bil lower legs (chronic per pt) Pain Descriptors / Indicators: Grimacing;Sore Pain Intervention(s): Repositioned;Monitored during session    [place] expects to be discharged to:: Private residence Living Arrangements: Children(son) Available Help at Discharge: Family;Available 24 hours/day Type of Home: House Home Access: Level entry     Home Layout: One level Home Equipment: Walker - 2 wheels;Shower seat - built in      Prior Function Level of Independence: Needs assistance   Gait / Transfers Assistance Needed: walked community distances holding onto son or cart  ADL's / Homemaking Assistance Needed: independent with bath, dress, feed; son does cook, clean, laundry, grocery shopping        Hand Dominance        Extremity/Trunk Assessment        Lower Extremity Assessment Lower Extremity Assessment: Generalized weakness    Cervical / Trunk Assessment Cervical / Trunk Assessment: Kyphotic  Communication   Communication: No difficulties  Cognition Arousal/Alertness: Awake/alert Behavior During Therapy: WFL for tasks assessed/performed Overall Cognitive Status:  Within Functional Limits for tasks assessed                                        General Comments      Exercises     Assessment/Plan    PT Assessment Patient needs continued PT services  PT Problem List Decreased strength;Decreased mobility;Decreased balance;Decreased activity tolerance       PT Treatment Interventions DME instruction;Therapeutic activities;Gait training;Therapeutic exercise;Patient/family education;Functional mobility  training;Balance training    PT Goals (Current goals can be found in the Care Plan section)  Acute Rehab PT Goals PT Goal Formulation: With patient Time For Goal Achievement: 02/25/20 Potential to Achieve Goals: Good    Frequency Min 3X/week   Barriers to discharge        Co-evaluation               AM-PAC PT "6 Clicks" Mobility  Outcome Measure Help needed turning from your back to your side while in a flat bed without using bedrails?: A Little Help needed moving from lying on your back to sitting on the side of a flat bed without using bedrails?: A Little Help needed moving to and from a bed to a chair (including a wheelchair)?: A Little Help needed standing up from a chair using your arms (e.g., wheelchair or bedside chair)?: A Little Help needed to walk in hospital room?: A Little Help needed climbing 3-5 steps with a railing? : A Lot 6 Click Score: 17    End of Session Equipment Utilized During Treatment: Gait belt Activity Tolerance: Patient tolerated treatment well Patient left: in chair;with call bell/phone within reach;with chair alarm set;with nursing/sitter in room Nurse Communication: Mobility status PT Visit Diagnosis: Other abnormalities of gait and mobility (R26.89)    Time: [id number] PT Time Calculation (min) (ACUTE ONLY): 18 min   Charges:   PT Evaluation $PT Eval Low Complexity: 1 Low         Kati PT, DPT Acute Rehabilitation Services Office: [phone]  Trena Platt 02/11/2020, 12:20 PM

## 2020-02-11 NOTE — Progress Notes (Signed)
[facility]  PROGRESS NOTE  [name] [id number] DOB: 1926-12-18 DOA: 02/10/2020 PCP: [name], NP   Brief HPI:   86 year old female with history of atrial fibrillation, not on anticoagulation (patient refused to be on anticoagulation), aortic stenosis, iron deficiency anemia, hypertension, hyperlipidemia, hypothyroidism, rheumatoid arthritis, colovesical fistula came to hospital with acute on chronic worsening abdominal pain.  Patient has been followed by urology as outpatient.  She has refused surgery.  Patient reported that she has been dealing with a low abdominal pain due to fistula for many years but the pain has become more intense over the past 3 weeks.  Also reported constant burning with urination.    Subjective   Patient seen and examined, complains of abdominal pain   Assessment/Plan:     1. Acute cystitis/chronic colovesical fistula/chronic left urinary tract stricture-CT of the abdomen pelvis revealed previously noted fistula, impression of underlying bladder neoplasm cannot be excluded.  Bladder wall does show thickening with gas.  Also showed chronic hydronephrosis.  Patient declined procedural evaluation.  Patient has been started on cefepime and Flagyl.  Urine culture is pending.  Urology has been consulted.  Dr. Diona Fanti will see patient in the hospital. 2. Permanent atrial fibrillation with RVR-patient has declined anticoagulation in the past. 3. Severe aortic stenosis-patient has seen cardiology in the past at was felt to be cirrhotic stenosis.  Patient will follow up with cardiology as outpatient for discussion for TAVR. 4. Normocytic anemia-patient's hemoglobin is 7.3 today, she presented with hemoglobin of 8.8, likely anemia of chronic disease and iron deficiency anemia.We will obtain anemia panel.  Her previous hemoglobin was 11.0 on 01/25/2020. 5. Acute kidney injury on CKD stage III-patient baseline creatinine is around 1.3, today  creatinine is 1.67.  Likely from dehydration.  Will start gentle IV hydration with normal saline.  Follow BMP in a.m. 6. Stable 6.2 cm left adnexal mass-stable complex lobulated 6.2 cm cystic left adnexal mass, indeterminate for cystic ovarian neoplasm.  Patient will need pelvic ultrasound as outpatient. 7. Hypothyroidism-continue levothyroxine 8. Peripheral neuropathy-continue gabapentin     SpO2: 100 %   COVID-19 Labs  No results for input(s): DDIMER, FERRITIN, LDH, CRP in the last 72 hours.  Lab Results  Component Value Date   SARSCOV2NAA NEGATIVE 02/10/2020   [place] NEGATIVE 09/02/2019   [place] NEGATIVE 08/21/2019     CBG: Recent Labs  Lab 02/11/20 0740  GLUCAP 101*    CBC: Recent Labs  Lab 02/10/20 0026 02/11/20 0549  WBC 10.6* 10.1  HGB 8.8* 7.3*  HCT 28.4* 24.4*  MCV 89.6 92.1  PLT 423* [id number]    Basic Metabolic Panel: Recent Labs  Lab 02/10/20 0026 02/11/20 0549  NA 138 136  K 4.3 5.0  CL 111 112*  CO2 18* 17*  GLUCOSE 105* 108*  BUN 26* 28*  CREATININE 1.39* 1.67*  CALCIUM 8.9 8.3*     Liver Function Tests: Recent Labs  Lab 02/10/20 0026  AST 24  ALT 15  ALKPHOS 101  BILITOT 0.6  PROT 7.8  ALBUMIN 3.6      DVT prophylaxis: Heparin  Code Status: Full code  Family Communication: No family at bedside  Disposition Plan: l patient mated with low abdominal pain, has chronic colovesical fistula.  Likely from cystitis.  Started on IV antibiotics.  Barrier to discharge-pending urine culture results.  Urology consultation.     Scheduled medications:  . aspirin  81 mg Oral Daily  . gabapentin  100 mg Oral QHS  .  heparin  5,000 Units Subcutaneous Q12H  . levothyroxine  75 mcg Oral QAC breakfast  . metoprolol succinate  75 mg Oral Daily  . multivitamin with minerals  1 tablet Oral Daily  . sodium chloride flush  3 mL Intravenous Once    Consultants:  None   Procedures:  None   Antibiotics:   Anti-infectives (From  admission, onward)   Start     Dose/Rate Route Frequency Ordered Stop   02/11/20 1000  ceFEPIme (MAXIPIME) 2 g in sodium chloride 0.9 % 100 mL IVPB     2 g 200 mL/hr over 30 Minutes Intravenous Every 24 hours 02/10/20 1506     02/10/20 2300  metroNIDAZOLE (FLAGYL) IVPB 500 mg     500 mg 100 mL/hr over 60 Minutes Intravenous Every 8 hours 02/10/20 1505     02/10/20 1515  metroNIDAZOLE (FLAGYL) IVPB 500 mg     500 mg 100 mL/hr over 60 Minutes Intravenous STAT 02/10/20 1504 02/10/20 1655   02/10/20 0845  ceFEPIme (MAXIPIME) 2 g in sodium chloride 0.9 % 100 mL IVPB     2 g 200 mL/hr over 30 Minutes Intravenous  Once 02/10/20 0835 02/10/20 1049       Objective   Vitals:   02/10/20 2316 02/11/20 0333 02/11/20 0413 02/11/20 1517  BP: (!) 112/55 (!) 88/57 (!) 90/45 108/60  Pulse: (!) 101 85 76 79  Resp: 17 17    Temp: 98.5 F (36.9 C) 98.5 F (36.9 C)  98.4 F (36.9 C)  TempSrc:    Oral  SpO2: 98% 99%  100%    Intake/Output Summary (Last 24 hours) at 02/11/2020 1605 Last data filed at 02/11/2020 1500 Gross per 24 hour  Intake 1111.73 ml  Output 200 ml  Net 911.73 ml    04/12 1901 - 04/14 0700 In: 1208.8 [I.V.:918.3] Out: -   There were no vitals filed for this visit.  Physical Examination:    General-appears in no acute distress  Heart-S1-S2, irregular, no murmur auscultated  Lungs-clear to auscultation bilaterally, no wheezing or crackles auscultated  Abdomen-soft, nontender, no organomegaly  Extremities-no edema in the lower extremities  Neuro-alert, oriented x3, no focal deficit noted    Data Reviewed:   Recent Results (from the past 240 hour(s))  SARS CORONAVIRUS 2 (TAT 6-24 HRS) Nasopharyngeal     Status: None   Collection Time: 02/10/20  6:29 AM   Specimen: Nasopharyngeal  Result Value Ref Range Status   SARS Coronavirus 2 NEGATIVE NEGATIVE Final    Comment: (NOTE) SARS-CoV-2 target nucleic acids are NOT DETECTED. The SARS-CoV-2 RNA is generally  detectable in upper and lower respiratory specimens during the acute phase of infection. Negative results do not preclude SARS-CoV-2 infection, do not rule out co-infections with other pathogens, and should not be used as the sole basis for treatment or other patient management decisions. Negative results must be combined with clinical observations, patient history, and epidemiological information. The expected result is Negative. Fact Sheet for Patients: SugarRoll.be Fact Sheet for Healthcare Providers: https://www.woods-mathews.com/ This test is not yet approved or cleared by the [place] FDA and  has been authorized for detection and/or diagnosis of SARS-CoV-2 by FDA under an Emergency Use Authorization (EUA). This EUA will remain  in effect (meaning this test can be used) for the duration of the COVID-19 declaration under Section 56 4(b)(1) of the Act, 21 U.S.C. section 360bbb-3(b)(1), unless the authorization is terminated or revoked sooner. Performed at [facility], [place]  [address], [place],  [address]     Recent Labs  Lab 02/10/20 0026  LIPASE 98*   No results for input(s): AMMONIA in the last 168 hours.  Cardiac Enzymes: No results for input(s): CKTOTAL, CKMB, CKMBINDEX, TROPONINI in the last 168 hours. BNP (last 3 results) Recent Labs    09/02/19 1727  BNP 1,352.1*    ProBNP (last 3 results) No results for input(s): PROBNP in the last 8760 hours.  Studies:  CT ABDOMEN PELVIS W CONTRAST  Result Date: 02/10/2020 CLINICAL DATA:  Generalized lower abdominal pain intermittently for 3 years. History of UTI. EXAM: CT ABDOMEN AND PELVIS WITH CONTRAST TECHNIQUE: Multidetector CT imaging of the abdomen and pelvis was performed using the standard protocol following bolus administration of intravenous contrast. CONTRAST:  90mL OMNIPAQUE IOHEXOL 300 MG/ML  SOLN COMPARISON:  10/13/2018 CT abdomen/pelvis. FINDINGS:  Lower chest: No significant pulmonary nodules or acute consolidative airspace disease. Right coronary atherosclerosis. Hepatobiliary: Normal liver size. No liver mass. Normal gallbladder with no radiopaque cholelithiasis. No biliary ductal dilatation. Pancreas: Normal, with no mass or duct dilation. Spleen: Normal size spleen. Low-attenuation subcentimeter inferior splenic lesion is unchanged and too small to characterize, considered benign. No new splenic lesions. Adrenals/Urinary Tract: Normal adrenals. Chronic moderate left hydroureteronephrosis to the level of the upper left pelvic ureter with worsening urothelial wall thickening and hyperenhancement in the dilated portion of the upper left urinary tract. Asymmetrically delayed left contrast nephrogram. No right hydronephrosis. Subcentimeter hypodense posterior interpolar right renal cortical lesion is too small to characterize and is unchanged, considered benign. No additional renal lesions. Diffuse bladder wall thickening with gas in the nondependent bladder lumen. There is loss of the normal fat plane between the sigmoid colon and left superior bladder wall with intervening gas and soft tissue most compatible with a chronic colovesical fistula, unchanged from prior CT (series 4/image 53). Stomach/Bowel: Large hiatal hernia. Partially visualized stomach is nondistended and otherwise normal. Normal caliber small bowel with no small bowel wall thickening. Appendectomy. Oral contrast transits to the rectum. Moderate diffuse colonic diverticulosis. There is marked wall thickening and mucosal hyperenhancement throughout sigmoid colon with some associated pericolonic fat stranding, not substantially changed from 05/26/2019 CT. Evidence of chronic colovesical fistula between sigmoid colon and left superior bladder wall as detailed above. No discrete drainable pericolonic abscess on today's scan. Vascular/Lymphatic: Atherosclerotic nonaneurysmal abdominal aorta.  Patent portal, splenic and renal veins. No pathologically enlarged lymph nodes in the abdomen or pelvis. Reproductive: Complex lobulated 6.2 x 4.7 cm cystic left adnexal mass with mixed internal cystic and soft tissue density (series 2/image 50), previously 6.3 x 5.2 cm, not substantially changed. No right adnexal mass. Unremarkable uterus. Other: No ascites. Musculoskeletal: No aggressive appearing focal osseous lesions. Moderate thoracolumbar spondylosis. IMPRESSION: 1. Chronic severe diverticular disease in the sigmoid colon with chronic colovesical fistula. Underlying bladder neoplasm cannot be excluded by imaging, however is less favored given the chronicity of this process. No discrete drainable pericolonic abscess on today's scan. No bowel obstruction. 2. Chronic left urinary tract stricture at the level of the upper left pelvic ureter with moderate left hydroureteronephrosis, presumably due to extrinsic involvement by the chronic inflammatory diverticular process in the sigmoid colon. Worsening urothelial wall thickening and hyperenhancement in the dilated upper left urinary tract, suggesting ascending left urinary tract infection. 3. Diffuse bladder wall thickening with bladder gas, suggesting active cystitis in this patient with colovesical fistula. 4. Stable complex lobulated 6.2 cm cystic left adnexal mass, indeterminate for cystic ovarian neoplasm. 5.  Large hiatal hernia. 6. Coronary atherosclerosis. 7. Aortic Atherosclerosis (ICD10-I70.0). Electronically Signed   By: Ilona Sorrel M.D.   On: 02/10/2020 10:31   DG Chest Port 1 View  Result Date: 02/10/2020 CLINICAL DATA:  Shortness of breath this morning EXAM: PORTABLE CHEST 1 VIEW COMPARISON:  09/12/2019 FINDINGS: Chronic enlarged cardiopericardial size accentuated by a large hiatal hernia. There is no edema, consolidation, effusion, or pneumothorax. Lung volumes are low IMPRESSION: 1. Cardiomegaly without failure. 2. Hiatal hernia. Electronically  Signed   By: Monte Fantasia M.D.   On: 02/10/2020 06:43     Admission status: The appropriate admission status for this patient is INPATIENT. Inpatient status is judged to be reasonable and necessary in order to provide the required intensity of service to ensure the patient's safety. The patient's presenting symptoms, physical exam findings, and initial radiographic and laboratory data in the context of their chronic comorbidities is felt to place them at high risk for further clinical deterioration. Furthermore, it is not anticipated that the patient will be medically stable for discharge from the hospital within 2 midnights of admission. The following factors support the admission status of inpatient.    The patient's presenting symptoms include abdominal pain The worrisome physical exam findings include abdominal tenderness. The initial radiographic and laboratory data are worrisome because of UTI. The chronic co-morbidities include chronic colovesical fistula.    * I certify that at the point of admission it is my clinical judgment that the patient will require inpatient hospital care spanning beyond 2 midnights from the point of admission due to high intensity of service, high risk for further deterioration and high frequency of surveillance required.Oswald Hillock   [facility] If 7PM-7AM, please contact night-coverage at [email or web address], Office  [phone]   02/11/2020, 4:05 PM  LOS: 1 day

## 2020-02-12 DIAGNOSIS — R109 Unspecified abdominal pain: Secondary | ICD-10-CM | POA: Diagnosis not present

## 2020-02-12 DIAGNOSIS — N3090 Cystitis, unspecified without hematuria: Secondary | ICD-10-CM | POA: Diagnosis not present

## 2020-02-12 LAB — CBC
HCT: 25.2 % — ABNORMAL LOW (ref 36.0–46.0)
Hemoglobin: 7.5 g/dL — ABNORMAL LOW (ref 12.0–15.0)
MCH: 27.6 pg (ref 26.0–34.0)
MCHC: 29.8 g/dL — ABNORMAL LOW (ref 30.0–36.0)
MCV: 92.6 fL (ref 80.0–100.0)
Platelets: 309 10*3/uL (ref 150–400)
RBC: 2.72 MIL/uL — ABNORMAL LOW (ref 3.87–5.11)
RDW: 18 % — ABNORMAL HIGH (ref 11.5–15.5)
WBC: 10.8 10*3/uL — ABNORMAL HIGH (ref 4.0–10.5)
nRBC: 0 % (ref 0.0–0.2)

## 2020-02-12 LAB — BASIC METABOLIC PANEL
Anion gap: 8 (ref 5–15)
BUN: 34 mg/dL — ABNORMAL HIGH (ref 8–23)
CO2: 17 mmol/L — ABNORMAL LOW (ref 22–32)
Calcium: 8.4 mg/dL — ABNORMAL LOW (ref 8.9–10.3)
Chloride: 114 mmol/L — ABNORMAL HIGH (ref 98–111)
Creatinine, Ser: 1.81 mg/dL — ABNORMAL HIGH (ref 0.44–1.00)
GFR calc Af Amer: 28 mL/min — ABNORMAL LOW (ref >60)
GFR calc non Af Amer: 24 mL/min — ABNORMAL LOW (ref >60)
Glucose, Bld: 154 mg/dL — ABNORMAL HIGH (ref 70–99)
Potassium: 4.6 mmol/L (ref 3.5–5.1)
Sodium: 139 mmol/L (ref 135–145)

## 2020-02-12 LAB — GLUCOSE, CAPILLARY: Glucose-Capillary: 141 mg/dL — ABNORMAL HIGH (ref 70–99)

## 2020-02-12 LAB — CREATININE, URINE, RANDOM: Creatinine, Urine: 109.06 mg/dL

## 2020-02-12 LAB — OCCULT BLOOD X 1 CARD TO LAB, STOOL
Fecal Occult Bld: NEGATIVE
Fecal Occult Bld: POSITIVE — AB

## 2020-02-12 LAB — SODIUM, URINE, RANDOM: Sodium, Ur: 66 mmol/L

## 2020-02-12 MED ORDER — FERROUS SULFATE 325 (65 FE) MG PO TABS
325.0000 mg | ORAL_TABLET | Freq: Two times a day (BID) | ORAL | Status: DC
  Filled 2020-02-12 (×3): qty 1

## 2020-02-12 MED ORDER — FUROSEMIDE 20 MG PO TABS: 20.0000 mg | ORAL_TABLET | Freq: Every day | ORAL | Status: DC

## 2020-02-12 NOTE — TOC Initial Note (Signed)
Transition of Care [facility]) - Initial/Assessment Note    Patient Details  Name: [name] MRN: [id number] Date of Birth: [birth year]  [facility] CM/SW Contact:    Trish Mage, LCSW Phone Number: 02/12/2020, 4:26 PM  Clinical Narrative:   CSW met with patient to follow up on recommendation of [facility].  Found patient engaging, but overall avoidant of decision making.  She lives here in [place] with adult son; has another son in area as well.  Son with whom she lives is retired and they are together round the clock for the most part.  She is not willing to commit to [facility] services without talking to son, but declined to call him with me, stating "he is usually not up this early in the day."  She stated she would talk to him at some point to find out his recommendation, and make a decision based on that. TOC will continue to follow during the course of hospitalization.                 Expected Discharge Plan: Home/Self Care Barriers to Discharge: No Barriers Identified   Patient Goals and CMS Choice        Expected Discharge Plan and Services Expected Discharge Plan: Home/Self Care   Discharge Planning Services: CM Consult   Living arrangements for the past 2 months: Single Family Home                                      Prior Living Arrangements/Services Living arrangements for the past 2 months: Single Family Home Lives with:: Adult Children Patient language and need for interpreter reviewed:: Yes Do you feel safe going back to the place where you live?: Yes      Need for Family Participation in Patient Care: Yes (Comment) Care giver support system in place?: Yes (comment) Current home services: DME Criminal Activity/Legal Involvement Pertinent to Current Situation/Hospitalization: No - Comment as needed  Activities of Daily Living Home Assistive Devices/Equipment: Walker (specify type), Built-in shower seat, Dentures (specify type)(walk-in  shower, front wheeled walker, upper/lower partial plates) ADL Screening (condition at time of admission) Patient's cognitive ability adequate to safely complete daily activities?: Yes Is the patient deaf or have difficulty hearing?: No Does the patient have difficulty seeing, even when wearing glasses/contacts?: No Does the patient have difficulty concentrating, remembering, or making decisions?: No Patient able to express need for assistance with ADLs?: Yes Does the patient have difficulty dressing or bathing?: No Independently performs ADLs?: Yes (appropriate for developmental age) Does the patient have difficulty walking or climbing stairs?: Yes(don't do stairs) Weakness of Legs: Both Weakness of Arms/Hands: None  Permission Sought/Granted Permission sought to share information with : Family Supports Permission granted to share information with : Yes, Verbal Permission Granted  Share Information with NAME: son Dellis Filbert           Emotional Assessment Appearance:: Appears stated age Attitude/Demeanor/Rapport: Engaged Affect (typically observed): Appropriate Orientation: : Oriented to Self, Oriented to Place, Oriented to Situation Alcohol / Substance Use: Not Applicable Psych Involvement: No (comment)  Admission diagnosis:  Cystitis [N30.90] Abdominal pain, unspecified abdominal location [R10.9] Patient Active Problem List   Diagnosis Date Noted  . Cystitis 02/10/2020  . Aortic atherosclerosis ([place]) 09/16/2019  . Acute CHF (congestive heart failure) ([place]) 09/02/2019  . UTI (urinary tract infection) 08/21/2019  . Colovesical fistula 05/28/2019  . Polyneuropathy  11/06/2018  . Goals of care, counseling/discussion   . Hydronephrosis 10/13/2018  . Ovarian mass 10/13/2018  . C. difficile colitis 10/13/2018  . Clostridium difficile colitis 09/24/2018  . Hypokalemia   . Hypomagnesemia   . Left ovarian cyst   . Acute diverticulitis 09/07/2018  . Leucocytosis 09/06/2018  . A-fib  ([place]) 09/06/2018  . Malnutrition of moderate degree 07/26/2018  . Perforation of sigmoid colon due to diverticulitis 07/25/2018  . Thrombocytosis ([place]) 07/25/2018  . Sepsis ([place]) 07/24/2018  . Generalized abdominal pain 02/06/2018  . Paresthesia of both lower extremities 02/06/2018  . Chronic kidney disease (CKD), stage III (moderate) 01/07/2018  . Normocytic normochromic anemia 01/07/2018  . Hyperglycemia 01/07/2018  . Iron deficiency 09/19/2017  . Pyoderma gangrenosum 01/04/2017  . Primary osteoarthritis of both hands 01/04/2017  . Primary osteoarthritis of both feet 01/04/2017  . Primary osteoarthritis of both knees 01/04/2017  . Degenerative joint disease involving multiple joints 01/04/2017  . Neuropathic pain 09/06/2015  . Erythema nodosum 09/21/2014  . Rheumatoid arthritis ([place]) 09/21/2014  . Essential hypertension 09/02/2014  . Rectal bleeding 05/15/2014  . Atrial fibrillation, chronic 05/11/2014  . HLD (hyperlipidemia) 04/02/2014  . Hypothyroidism 04/02/2014  . PAC (premature atrial contraction) 01/23/2014  . Near syncope 01/22/2014  . Varicose veins of lower extremities with other complications 81/85/6314  . Localized edema 06/21/2012  . Venous insufficiency of both lower extremities 04/19/2012  . Leg edema 01/17/2012  . Anxiety 11/05/2011  . Aortic stenosis 03/22/2011   PCP:  [name], NP Pharmacy:   [facility], [place] [address] Phone: [phone] Fax: [phone]     Social Determinants of Health (SDOH) Interventions    Readmission Risk Interventions Readmission Risk Prevention Plan 09/03/2019  Transportation Screening Complete  PCP or Specialist Appt within 3-5 Days Complete  HRI or [place] Complete  Social Work Consult for [place] Planning/Counseling Complete  Palliative Care Screening Not Applicable  Medication Review Press photographer)  Complete  Some recent data might be hidden

## 2020-02-12 NOTE — Progress Notes (Signed)
[facility]  PROGRESS NOTE  [name] [id number] DOB: 04-22-1927 DOA: 02/10/2020 PCP: [name], NP   Brief HPI:   86 year old female with history of atrial fibrillation, not on anticoagulation (patient refused to be on anticoagulation), aortic stenosis, iron deficiency anemia, hypertension, hyperlipidemia, hypothyroidism, rheumatoid arthritis, colovesical fistula came to hospital with acute on chronic worsening abdominal pain.  Patient has been followed by urology as outpatient.  She has refused surgery.  Patient reported that she has been dealing with a low abdominal pain due to fistula for many years but the pain has become more intense over the past 3 weeks.  Also reported constant burning with urination.  Subjective   Patient seen and examined, appreciate urology input.  Urine culture growing E. coli.   Assessment/Plan:    1. Acute cystitis/chronic colovesical fistula/chronic left urinary tract stricture-CT of the abdomen pelvis revealed previously noted fistula, impression of underlying bladder neoplasm cannot be excluded.  Bladder wall does show thickening with gas.  Also showed chronic hydronephrosis.  Patient declined procedural evaluation.  Patient has been started on cefepime and Flagyl.  Urine culture is growing E. coli.  Urology has been consulted.  Dr. Diona Fanti has seen the patient.  Urology has nothing new to offer.  Patient does not want surgery. 2. Permanent atrial fibrillation with RVR-patient has declined anticoagulation in the past. 3. Severe aortic stenosis-patient has seen cardiology in the past at was felt to be cirrhotic stenosis.  Patient will follow up with cardiology as outpatient for discussion for TAVR. 4. Normocytic anemia-patient's hemoglobin is 7.5 today, she presented with hemoglobin of 8.8, likely anemia of chronic disease and iron deficiency anemia..  Her previous hemoglobin was 11.0 on 01/25/2020.  Iron is 25, TIBC 302, saturation  8%, folate 21.8, vitamin B12 1,388.  We will start her on ferrous sulfate 325 mg p.o. twice daily.  Will obtain FOBT. 5. Acute kidney injury on CKD stage III-patient baseline creatinine is around 1.3, today creatinine is up to 1.81.  Likely hypotension induced ischemic injury.  Will obtain urine sodium and urine creatinine.  Increase IV normal saline at 125 mL/h.  Follow BMP in a.m.   6. Stable 6.2 cm left adnexal mass-stable complex lobulated 6.2 cm cystic left adnexal mass, indeterminate for cystic ovarian neoplasm.  Patient will need pelvic ultrasound as outpatient.  Discussed with patient, she does not want further evaluation for left adnexal mass.   7. Hypothyroidism-continue levothyroxine 8. Peripheral neuropathy-continue gabapentin     SpO2: 100 % O2 Flow Rate (L/min): 2 L/min   COVID-19 Labs  Recent Labs    02/11/20 1637  FERRITIN 34    Lab Results  Component Value Date   SARSCOV2NAA NEGATIVE 02/10/2020   [place] NEGATIVE 09/02/2019   [place] NEGATIVE 08/21/2019     CBG: Recent Labs  Lab 02/11/20 0740 02/12/20 0730  GLUCAP 101* 141*    CBC: Recent Labs  Lab 02/10/20 0026 02/11/20 0549 02/12/20 0603  WBC 10.6* 10.1 10.8*  HGB 8.8* 7.3* 7.5*  HCT 28.4* 24.4* 25.2*  MCV 89.6 92.1 92.6  PLT 423* 308 [id number]    Basic Metabolic Panel: Recent Labs  Lab 02/10/20 0026 02/11/20 0549 02/12/20 0603  NA 138 136 139  K 4.3 5.0 4.6  CL 111 112* 114*  CO2 18* 17* 17*  GLUCOSE 105* 108* 154*  BUN 26* 28* 34*  CREATININE 1.39* 1.67* 1.81*  CALCIUM 8.9 8.3* 8.4*     Liver Function Tests: Recent Labs  Lab 02/10/20  0026  AST 24  ALT 15  ALKPHOS 101  BILITOT 0.6  PROT 7.8  ALBUMIN 3.6      DVT prophylaxis: Heparin  Code Status: Full code  Family Communication: No family at bedside  Disposition Plan: l patient mated with low abdominal pain, has chronic colovesical fistula.  Likely from cystitis.  Started on IV antibiotics.  Barrier to  discharge-pending urine culture results.      Scheduled medications:  . aspirin  81 mg Oral Daily  . gabapentin  100 mg Oral QHS  . heparin  5,000 Units Subcutaneous Q12H  . levothyroxine  75 mcg Oral QAC breakfast  . metoprolol succinate  75 mg Oral Daily  . multivitamin with minerals  1 tablet Oral Daily  . sodium chloride flush  3 mL Intravenous Once    Consultants:  None   Procedures:  None   Antibiotics:   Anti-infectives (From admission, onward)   Start     Dose/Rate Route Frequency Ordered Stop   02/11/20 1000  ceFEPIme (MAXIPIME) 2 g in sodium chloride 0.9 % 100 mL IVPB     2 g 200 mL/hr over 30 Minutes Intravenous Every 24 hours 02/10/20 1506     02/10/20 2300  metroNIDAZOLE (FLAGYL) IVPB 500 mg     500 mg 100 mL/hr over 60 Minutes Intravenous Every 8 hours 02/10/20 1505     02/10/20 1515  metroNIDAZOLE (FLAGYL) IVPB 500 mg     500 mg 100 mL/hr over 60 Minutes Intravenous STAT 02/10/20 1504 02/10/20 1655   02/10/20 0845  ceFEPIme (MAXIPIME) 2 g in sodium chloride 0.9 % 100 mL IVPB     2 g 200 mL/hr over 30 Minutes Intravenous  Once 02/10/20 0835 02/10/20 1049       Objective   Vitals:   02/11/20 1517 02/11/20 2247 02/12/20 0603 02/12/20 1403  BP: 108/60 114/64 114/71 100/66  Pulse: 79 79 66 71  Resp:  15 18 19   Temp: 98.4 F (36.9 C) 98 F (36.7 C) 97.8 F (36.6 C) 97.7 F (36.5 C)  TempSrc: Oral Oral Oral Oral  SpO2: 100% 100% 100% 100%   No intake or output data in the 24 hours ending 02/12/20 1555  04/13 1901 - 04/15 0700 In: 1011.7 [I.V.:918.3] Out: 200 [Urine:200]  There were no vitals filed for this visit.  Physical Examination:    General-appears in no acute distress  Heart-S1-S2, regular, no murmur auscultated  Lungs-clear to auscultation bilaterally, no wheezing or crackles auscultated  Abdomen-soft, mild generalized tenderness to palpation  Extremities-no edema in the lower extremities  Neuro-alert, oriented x3, no  focal deficit noted    Data Reviewed:   Recent Results (from the past 240 hour(s))  SARS CORONAVIRUS 2 (TAT 6-24 HRS) Nasopharyngeal     Status: None   Collection Time: 02/10/20  6:29 AM   Specimen: Nasopharyngeal  Result Value Ref Range Status   SARS Coronavirus 2 NEGATIVE NEGATIVE Final    Comment: (NOTE) SARS-CoV-2 target nucleic acids are NOT DETECTED. The SARS-CoV-2 RNA is generally detectable in upper and lower respiratory specimens during the acute phase of infection. Negative results do not preclude SARS-CoV-2 infection, do not rule out co-infections with other pathogens, and should not be used as the sole basis for treatment or other patient management decisions. Negative results must be combined with clinical observations, patient history, and epidemiological information. The expected result is Negative. Fact Sheet for Patients: SugarRoll.be Fact Sheet for Healthcare Providers: https://www.woods-mathews.com/ This test is not yet  approved or cleared by the [place] and  has been authorized for detection and/or diagnosis of SARS-CoV-2 by FDA under an Emergency Use Authorization (EUA). This EUA will remain  in effect (meaning this test can be used) for the duration of the COVID-19 declaration under Section 56 4(b)(1) of the Act, 21 U.S.C. section 360bbb-3(b)(1), unless the authorization is terminated or revoked sooner. Performed at [facility], [place] [address], [place], [place] [address]   Urine culture     Status: Abnormal (Preliminary result)   Collection Time: 02/10/20  7:10 AM   Specimen: Urine, Catheterized  Result Value Ref Range Status   Specimen Description   Final    URINE, CATHETERIZED Performed at [facility] [address] [place], [place] [address]    Special Requests   Final    NONE Performed at [facility], [place] [address], [place], [place] [address]     Culture (A)  Final    >=100,000 COLONIES/mL ESCHERICHIA COLI SUSCEPTIBILITIES TO FOLLOW Performed at [facility], [place] [address] [place], [place] [address]    Report Status PENDING  Incomplete    Recent Labs  Lab 02/10/20 0026  LIPASE 98*   No results for input(s): AMMONIA in the last 168 hours.  Cardiac Enzymes: No results for input(s): CKTOTAL, CKMB, CKMBINDEX, TROPONINI in the last 168 hours. BNP (last 3 results) Recent Labs    09/02/19 1727  BNP 1,352.1*    ProBNP (last 3 results) No results for input(s): PROBNP in the last 8760 hours.  Studies:  No results found.   Admission status: The appropriate admission status for this patient is INPATIENT. Inpatient status is judged to be reasonable and necessary in order to provide the required intensity of service to ensure the patient's safety. The patient's presenting symptoms, physical exam findings, and initial radiographic and laboratory data in the context of their chronic comorbidities is felt to place them at high risk for further clinical deterioration. Furthermore, it is not anticipated that the patient will be medically stable for discharge from the hospital within 2 midnights of admission. The following factors support the admission status of inpatient.    The patient's presenting symptoms include abdominal pain The worrisome physical exam findings include abdominal tenderness. The initial radiographic and laboratory data are worrisome because of UTI. The chronic co-morbidities include chronic colovesical fistula.    * I certify that at the point of admission it is my clinical judgment that the patient will require inpatient hospital care spanning beyond 2 midnights from the point of admission due to high intensity of service, high risk for further deterioration and high frequency of surveillance required.Oswald Hillock   [facility] If 7PM-7AM, please contact night-coverage at  [email or web address], Office  [phone]   02/12/2020, 3:55 PM  LOS: 2 days

## 2020-02-13 ENCOUNTER — Inpatient Hospital Stay: Payer: Self-pay

## 2020-02-13 DIAGNOSIS — N3090 Cystitis, unspecified without hematuria: Secondary | ICD-10-CM | POA: Diagnosis not present

## 2020-02-13 DIAGNOSIS — R109 Unspecified abdominal pain: Secondary | ICD-10-CM | POA: Diagnosis not present

## 2020-02-13 LAB — BASIC METABOLIC PANEL
Anion gap: 6 (ref 5–15)
BUN: 29 mg/dL — ABNORMAL HIGH (ref 8–23)
CO2: 17 mmol/L — ABNORMAL LOW (ref 22–32)
Calcium: 8.4 mg/dL — ABNORMAL LOW (ref 8.9–10.3)
Chloride: 115 mmol/L — ABNORMAL HIGH (ref 98–111)
Creatinine, Ser: 1.57 mg/dL — ABNORMAL HIGH (ref 0.44–1.00)
GFR calc Af Amer: 33 mL/min — ABNORMAL LOW (ref >60)
GFR calc non Af Amer: 29 mL/min — ABNORMAL LOW (ref >60)
Glucose, Bld: 134 mg/dL — ABNORMAL HIGH (ref 70–99)
Potassium: 4.4 mmol/L (ref 3.5–5.1)
Sodium: 138 mmol/L (ref 135–145)

## 2020-02-13 LAB — CBC
HCT: 27 % — ABNORMAL LOW (ref 36.0–46.0)
Hemoglobin: 8.1 g/dL — ABNORMAL LOW (ref 12.0–15.0)
MCH: 27.8 pg (ref 26.0–34.0)
MCHC: 30 g/dL (ref 30.0–36.0)
MCV: 92.8 fL (ref 80.0–100.0)
Platelets: 344 10*3/uL (ref 150–400)
RBC: 2.91 MIL/uL — ABNORMAL LOW (ref 3.87–5.11)
RDW: 18.5 % — ABNORMAL HIGH (ref 11.5–15.5)
WBC: 16.8 10*3/uL — ABNORMAL HIGH (ref 4.0–10.5)
nRBC: 0.1 % (ref 0.0–0.2)

## 2020-02-13 LAB — URINE CULTURE: Culture: >=100000 — AB

## 2020-02-13 LAB — GLUCOSE, CAPILLARY: Glucose-Capillary: 131 mg/dL — ABNORMAL HIGH (ref 70–99)

## 2020-02-13 MED ORDER — SODIUM CHLORIDE 0.9 % IV SOLN
500.0000 mg | Freq: Two times a day (BID) | INTRAVENOUS | Status: AC
  Administered 2020-02-13 (×2): 500 mg via INTRAVENOUS
  Filled 2020-02-13 (×2): qty 500

## 2020-02-13 MED ORDER — SODIUM BICARBONATE 650 MG PO TABS
650.0000 mg | ORAL_TABLET | Freq: Two times a day (BID) | ORAL | Status: DC
  Administered 2020-02-13 – 2020-02-16 (×5): 650 mg via ORAL
  Filled 2020-02-13 (×6): qty 1

## 2020-02-13 MED ORDER — LUBRIDERM SERIOUSLY SENSITIVE EX LOTN
TOPICAL_LOTION | Freq: Two times a day (BID) | CUTANEOUS | Status: DC | PRN
  Filled 2020-02-13: qty 562

## 2020-02-13 MED ORDER — SODIUM CHLORIDE 0.9 % IV SOLN
500.0000 mg | INTRAVENOUS | Status: DC
  Administered 2020-02-14 – 2020-02-16 (×3): 500 mg via INTRAVENOUS
  Filled 2020-02-13 (×3): qty 0.5

## 2020-02-13 NOTE — Progress Notes (Signed)
Physical Therapy Treatment and Discharge Patient Details Name: [name] MRN: [id number] DOB: Mar 20, 1927 Today's Date: 02/13/2020    History of Present Illness 86 yo female admitted with cystitis. Hx of hypothyroidism, anemia, RA, CKD, A fib, aortic stenosis, neuropathic pain    PT Comments    Pt sitting EOB on arrival.  Pt declines to mobilize.  Pt requesting to wash her lips so provided with warm washcloth.  Pt also performed a few ankle pumps however declined any further exercises.  Pt states she does not feel up to physical therapy while she is not feeling well.  She appears frustrated and sad with medical staff, "they don't listen to me.  They think I'm senile but I'm not.  My mind is all here."  Listened to pt and stated physical therapy would listen to her wishes to not mobilize or visit during acute stay if that is her desire.  Pt does decline physical therapy services at this time.  She feels she will be able to continue with her baseline household mobility upon return home and has son to assist if/when needed.  PT to sign off per pt wishes.   Follow Up Recommendations  Supervision for mobility/OOB;Home health PT(pt declines PT)     Equipment Recommendations  None recommended by PT    Recommendations for Other Services       Precautions / Restrictions Precautions Precautions: Fall Restrictions Weight Bearing Restrictions: No    Mobility  Bed Mobility Overal bed mobility: Needs Assistance             General bed mobility comments: pt sitting EOB on arrival  Transfers                    Ambulation/Gait                 Stairs             Wheelchair Mobility    Modified Rankin (Stroke Patients Only)       Balance   Sitting-balance support: No upper extremity supported;Feet supported Sitting balance-Leahy Scale: Good                                      Cognition Arousal/Alertness: Awake/alert Behavior  During Therapy: WFL for tasks assessed/performed Overall Cognitive Status: Within Functional Limits for tasks assessed                                        Exercises      General Comments        Pertinent Vitals/Pain Pain Assessment: Faces Faces Pain Scale: Hurts little more Pain Location: bil lower legs (chronic per pt) Pain Descriptors / Indicators: Grimacing;Sore Pain Intervention(s): Monitored during session    Home Living                      Prior Function            PT Goals (current goals can now be found in the care plan section) Progress towards PT goals: (pt declines PT at this time)    Frequency           PT Plan Other (comment)(d/c from PT)    Co-evaluation  AM-PAC PT "6 Clicks" Mobility   Outcome Measure  Help needed turning from your back to your side while in a flat bed without using bedrails?: A Little Help needed moving from lying on your back to sitting on the side of a flat bed without using bedrails?: A Little Help needed moving to and from a bed to a chair (including a wheelchair)?: A Little Help needed standing up from a chair using your arms (e.g., wheelchair or bedside chair)?: A Little Help needed to walk in hospital room?: A Little Help needed climbing 3-5 steps with a railing? : A Lot 6 Click Score: 17    End of Session   Activity Tolerance: Patient tolerated treatment well Patient left: with call bell/phone within reach;in bed         Time: [id number] PT Time Calculation (min) (ACUTE ONLY): 18 min  Charges:  $Therapeutic Activity: 8-22 mins                     Arlyce Dice, DPT Acute Rehabilitation Services Office: [phone]   Trena Platt 02/13/2020, 3:16 PM

## 2020-02-13 NOTE — Progress Notes (Signed)
PHARMACY CONSULT NOTE FOR:  OUTPATIENT  PARENTERAL ANTIBIOTIC THERAPY (OPAT)  Indication: Complicated ESBL E. Coli UTI Regimen: Ertapenem 500mg  IV q24h End date: 4/26/20201  IV antibiotic discharge orders are pended. To discharging provider:  please sign these orders via discharge navigator,  Select New Orders & click on the button choice - Manage This Unsigned Work.     Thank you for allowing pharmacy to be a part of this patient's care.  Doreene Eland, PharmD, BCPS.   Work Cell: [phone] 02/13/2020 3:28 PM

## 2020-02-13 NOTE — TOC Progression Note (Signed)
Transition of Care [facility]) - Progression Note    Patient Details  Name: [name] MRN: [id number] Date of Birth: 04/28/1927  [facility]) CM/SW Contact  Shade Flood, LCSW Phone Number: 02/13/2020, 3:07 PM  Clinical Narrative:     TOC following. Per MD, pt will need IV antibiotics at dc. Discussed HH vs SNF with pt who states she is not going to SNF. Called pt's son with her permission to discuss. Son states that he has assisted pt with IV medication at home in the past and he can do it again. Discussed provider options and referred to [facility] at son request.   Updated Santiago Glad and Pam with [facility] that MD anticipating dc home tomorrow. Dusting in Pharmacy aware and reaching out to MD to prepare for dc needs.  Weekend TOC will be available to assist with ongoing dc planning needs.  Expected Discharge Plan: Home/Self Care Barriers to Discharge: Continued Medical Work up  Expected Discharge Plan and Services Expected Discharge Plan: Home/Self Care In-house Referral: Clinical Social Work Discharge Planning Services: CM Consult Post Acute Care Choice: [place] arrangements for the past 2 months: [place]: RN, PT, IV Antibiotics HH Agency: [place] ([facility]) Date [place]: 02/13/20   Representative spoke with at [place]: [place] ([place]) Interventions    Readmission Risk Interventions Readmission Risk Prevention Plan 09/03/2019  Transportation Screening Complete  PCP or Specialist Appt within 3-5 Days Complete  HRI or [place] Complete  Social Work Consult for [place] Planning/Counseling Complete  Palliative Care Screening Not Applicable  Medication Review Press photographer) Complete  Some recent data might be hidden

## 2020-02-13 NOTE — Progress Notes (Signed)
Pharmacy Antibiotic Note  [name] is a 86 y.o. female admitted on 02/10/2020 with intra-abdominal infection.  Patient with history of a colovesicular fistula and reports stool in her bladder.   Patient received Cefepime 2gm IV x 1 dose in the ED today.  Upon admission, Zosyn per RX ordered for intra-abdominal infection. Patient with noted PCN allergy (rash).  Contacted Dr Andria Frames and received order to d/c Zosyn order and for pharmacy to dose Cefepime and Metronidazole.   Ucx cx + ESBL-->changing Cefepime/Flagyl to Meropenem  02/13/2020: - Afebrile - WBC remains elevated, trending UP - Scr 1.57, CrCl ~ 40ml/min - UCx + ESBL Ecoli  Plan: - D/C Cefepime & Flagyl - Meropenem 500mg  IV q12h (renally dose adjusted) - Follow renal function - When appropriate for discharge, may consider discharge on ertapenem 500mg  IV daily instead of Meropenem if once daily abx desired.      Temp (24hrs), Avg:97.9 F (36.6 C), Min:97.7 F (36.5 C), Max:98 F (36.7 C)  Recent Labs  Lab 02/10/20 0026 02/11/20 0549 02/12/20 0603 02/13/20 0537  WBC 10.6* 10.1 10.8* 16.8*  CREATININE 1.39* 1.67* 1.81* 1.57*    Estimated Creatinine Clearance: 17.1 mL/min (A) (by C-G formula based on SCr of 1.57 mg/dL (H)).    Allergies  Allergen Reactions  . Infliximab Other (See Comments)    REACTION: "Enlarged intestines and hernia. Also pushed intestines on lower part of lungs." REACTION: "Enlarged intestines and hernia. Also pushed intestines on lower part of lungs."  . Aspirin Nausea And Vomiting    Low Dose is ok Stomach problems  . Carvedilol Other (See Comments)    Fatigue/ ill Fatigue/ ill  . Codeine Nausea And Vomiting    "Deathly Sick"  . Penicillins Rash    Has patient had a PCN reaction causing immediate rash, facial/tongue/throat swelling, SOB or lightheadedness with hypotension: Y Has patient had a PCN reaction causing severe rash involving mucus membranes or skin necrosis: Y Has patient  had a PCN reaction that required hospitalization: N Has patient had a PCN reaction occurring within the last 10 years: N If all of the above answers are "NO", then may proceed with Cephalosporin use.     Antimicrobials this admission: 4/13 Cefepime >>  4/16 4/13 Metronidazole >>  4/16 4/16 Meropenem>>  Dose adjustments this admission:   Microbiology results: 4/13 Covid-19: negative 4/12 UCx: +ESBL Ecoli  Thank you for allowing pharmacy to be a part of this patient's care.  Netta Cedars, PharmD 02/13/2020 9:13 AM

## 2020-02-13 NOTE — TOC Progression Note (Signed)
Transition of Care [facility]) - Progression Note    Patient Details  Name: [name] MRN: [id number] Date of Birth: 1926/11/28  Transition of Care [facility]) CM/SW Contact  Shade Flood, LCSW Phone Number: 02/13/2020, 4:50 PM  Clinical Narrative:     TOC notified by MD that pt's other son, Merry Proud, here at the hospital and expresses concern about pt going home with IV anbx. He would like pt to go to SNF. This LCSW spoke with pt and Merry Proud in pt's room and pt is now agreeable to SNF placement. She has been at [facility] in the past and this is her first choice. She is agreeable to referrals to other facilities in case [facility] cannot accept her.   Referrals started. Pt still needs PICC placed. Updated [facility] and Pam at [facility] of change in plan.  Weekend TOC will follow. Updated MD and informed pt will need new covid test within 48 hours of dc.   Expected Discharge Plan: Home/Self Care Barriers to Discharge: Continued Medical Work up  Expected Discharge Plan and Services Expected Discharge Plan: Home/Self Care In-house Referral: Clinical Social Work Discharge Planning Services: CM Consult Post Acute Care Choice: [place] arrangements for the past 2 months: [place]: RN, PT, IV Antibiotics HH Agency: [place] ([facility]) Date [place]: 02/13/20   Representative spoke with at [place]: [place] ([place]) Interventions    Readmission Risk Interventions Readmission Risk Prevention Plan 09/03/2019  Transportation Screening Complete  PCP or Specialist Appt within 3-5 Days Complete  HRI or [place] Complete  Social Work Consult for [place] Planning/Counseling Complete  Palliative Care Screening Not Applicable  Medication Review Press photographer) Complete  Some recent data might be hidden

## 2020-02-13 NOTE — Progress Notes (Signed)
Son [name] would like to be consulted about patient's plan of care. He can be reached at [phone]. Mr. [name] and Probation officer discussed patient's refusal to have PICC inserted today. Mr. [name] states his brother may not be able and/or willing to care for patient at home.

## 2020-02-13 NOTE — Care Management Important Message (Signed)
Important Message  Patient Details IM Letter given to RN to present to the Patient Name: [name] MRN: [id number] Date of Birth: 22-Aug-1927   Medicare Important Message Given:  Yes     Kerin Salen 02/13/2020, 10:22 AM

## 2020-02-13 NOTE — Progress Notes (Signed)
[facility]  PROGRESS NOTE  [name] [id number] DOB: 1926/11/07 DOA: 02/10/2020 PCP: [name], NP   Brief HPI:   86 year old female with history of atrial fibrillation, not on anticoagulation (patient refused to be on anticoagulation), aortic stenosis, iron deficiency anemia, hypertension, hyperlipidemia, hypothyroidism, rheumatoid arthritis, colovesical fistula came to hospital with acute on chronic worsening abdominal pain.  Patient has been followed by urology as outpatient.  She has refused surgery.  Patient reported that she has been dealing with a low abdominal pain due to fistula for many years but the pain has become more intense over the past 3 weeks.  Also reported constant burning with urination.  Subjective   Patient seen and examined, urine culture growing ESBL E. coli.  Started on meropenem.   Assessment/Plan:    1. Acute cystitis/chronic colovesical fistula/chronic left urinary tract stricture-CT of the abdomen pelvis revealed previously noted fistula, impression of underlying bladder neoplasm cannot be excluded.  Bladder wall does show thickening with gas.  Also showed chronic hydronephrosis.  Patient declined procedural evaluation.  Patient was started on cefepime and Flagyl.  Urine culture grew ESBL E. coli.  Switch to meropenem.  Will obtain PICC line and discharged on ertapenem for total of 10 days of therapy.    Urology has nothing new to offer.  Patient does not want surgery. 2. Permanent atrial fibrillation with RVR-patient has declined anticoagulation in the past. 3. Severe aortic stenosis-patient has seen cardiology in the past at was felt to be cirrhotic stenosis.  Patient will follow up with cardiology as outpatient for discussion for TAVR. 4. Normocytic anemia-patient's hemoglobin is 7.5 today, she presented with hemoglobin of 8.8, likely anemia of chronic disease and iron deficiency anemia..  Her previous hemoglobin was 11.0 on 01/25/2020.   Iron is 25, TIBC 302, saturation 8%, folate 21.8, vitamin B12 1,388.  We will start her on ferrous sulfate 325 mg p.o. twice daily.  Will obtain FOBT. 5. Acute kidney injury on CKD stage III-patient baseline creatinine is around 1.3, today creatinine is down to 1.57.  Likely hypotension induced ischemic injury, ATN will obtain urine sodium and urine creatinine.  Continue IV normal saline at 123XX123 mL/h. 6. Metabolic acidosis-bicarb is 17.  Will start sodium bicarb tablets 650 mg p.o. twice daily. 7. Stable 6.2 cm left adnexal mass-stable complex lobulated 6.2 cm cystic left adnexal mass, indeterminate for cystic ovarian neoplasm.  Patient will need pelvic ultrasound as outpatient.  Discussed with patient, she does not want further evaluation for left adnexal mass.   8. Hypothyroidism-continue levothyroxine 9. Peripheral neuropathy-continue gabapentin     SpO2: 100 % O2 Flow Rate (L/min): 2 L/min   COVID-19 Labs  Recent Labs    02/11/20 1637  FERRITIN 34    Lab Results  Component Value Date   SARSCOV2NAA NEGATIVE 02/10/2020   [place] NEGATIVE 09/02/2019   [place] NEGATIVE 08/21/2019     CBG: Recent Labs  Lab 02/11/20 0740 02/12/20 0730 02/13/20 0755  GLUCAP 101* 141* 131*    CBC: Recent Labs  Lab 02/10/20 0026 02/11/20 0549 02/12/20 0603 02/13/20 0537  WBC 10.6* 10.1 10.8* 16.8*  HGB 8.8* 7.3* 7.5* 8.1*  HCT 28.4* 24.4* 25.2* 27.0*  MCV 89.6 92.1 92.6 92.8  PLT 423* 308 309 [id number]    Basic Metabolic Panel: Recent Labs  Lab 02/10/20 0026 02/11/20 0549 02/12/20 0603 02/13/20 0537  NA 138 136 139 138  K 4.3 5.0 4.6 4.4  CL 111 112* 114* 115*  CO2 18*  17* 17* 17*  GLUCOSE 105* 108* 154* 134*  BUN 26* 28* 34* 29*  CREATININE 1.39* 1.67* 1.81* 1.57*  CALCIUM 8.9 8.3* 8.4* 8.4*     Liver Function Tests: Recent Labs  Lab 02/10/20 0026  AST 24  ALT 15  ALKPHOS 101  BILITOT 0.6  PROT 7.8  ALBUMIN 3.6      DVT prophylaxis: Heparin  Code  Status: Full code  Family Communication: No family at bedside  Disposition Plan: l patient mated with low abdominal pain, has chronic colovesical fistula.  Likely from cystitis.  Started on IV antibiotics.  Barrier to discharge-pending urine culture results.      Scheduled medications:  . aspirin  81 mg Oral Daily  . ferrous sulfate  325 mg Oral BID WC  . gabapentin  100 mg Oral QHS  . heparin  5,000 Units Subcutaneous Q12H  . levothyroxine  75 mcg Oral QAC breakfast  . metoprolol succinate  75 mg Oral Daily  . multivitamin with minerals  1 tablet Oral Daily  . sodium chloride flush  3 mL Intravenous Once    Consultants:  None   Procedures:  None   Antibiotics:   Anti-infectives (From admission, onward)   Start     Dose/Rate Route Frequency Ordered Stop   02/14/20 1000  ertapenem (INVANZ) 500 mg in sodium chloride 0.9 % 50 mL IVPB     500 mg 100 mL/hr over 30 Minutes Intravenous Every 24 hours 02/13/20 1525     02/13/20 1000  meropenem (MERREM) 500 mg in sodium chloride 0.9 % 100 mL IVPB     500 mg 200 mL/hr over 30 Minutes Intravenous Every 12 hours 02/13/20 0918 02/14/20 0959   02/11/20 1000  ceFEPIme (MAXIPIME) 2 g in sodium chloride 0.9 % 100 mL IVPB  Status:  Discontinued     2 g 200 mL/hr over 30 Minutes Intravenous Every 24 hours 02/10/20 1506 02/13/20 0910   02/10/20 2300  metroNIDAZOLE (FLAGYL) IVPB 500 mg  Status:  Discontinued     500 mg 100 mL/hr over 60 Minutes Intravenous Every 8 hours 02/10/20 1505 02/13/20 0910   02/10/20 1515  metroNIDAZOLE (FLAGYL) IVPB 500 mg     500 mg 100 mL/hr over 60 Minutes Intravenous STAT 02/10/20 1504 02/10/20 1655   02/10/20 0845  ceFEPIme (MAXIPIME) 2 g in sodium chloride 0.9 % 100 mL IVPB     2 g 200 mL/hr over 30 Minutes Intravenous  Once 02/10/20 0835 02/10/20 1049       Objective   Vitals:   02/12/20 1957 02/13/20 0533 02/13/20 1113 02/13/20 1434  BP: 120/87 133/81 134/74 117/86  Pulse: 70 88 72 99  Resp: 18  16  20   Temp: 98 F (36.7 C) 97.9 F (36.6 C) 98 F (36.7 C) 98.6 F (37 C)  TempSrc: Oral Oral Oral Oral  SpO2: 93% 100% 96% 100%    Intake/Output Summary (Last 24 hours) at 02/13/2020 1540 Last data filed at 02/13/2020 1115 Gross per 24 hour  Intake 240 ml  Output 200 ml  Net 40 ml    04/14 1901 - 04/16 0700 In: 240 [P.O.:240] Out: -   There were no vitals filed for this visit.  Physical Examination:    General-appears in no acute distress  Heart-S1-S2, regular, no murmur auscultated  Lungs-clear to auscultation bilaterally, no wheezing or crackles auscultated  Abdomen-soft, nontender, no organomegaly  Extremities-no edema in the lower extremities  Neuro-alert, oriented x3, no focal deficit noted  Data Reviewed:   Recent Results (from the past 240 hour(s))  SARS CORONAVIRUS 2 (TAT 6-24 HRS) Nasopharyngeal     Status: None   Collection Time: 02/10/20  6:29 AM   Specimen: Nasopharyngeal  Result Value Ref Range Status   SARS Coronavirus 2 NEGATIVE NEGATIVE Final    Comment: (NOTE) SARS-CoV-2 target nucleic acids are NOT DETECTED. The SARS-CoV-2 RNA is generally detectable in upper and lower respiratory specimens during the acute phase of infection. Negative results do not preclude SARS-CoV-2 infection, do not rule out co-infections with other pathogens, and should not be used as the sole basis for treatment or other patient management decisions. Negative results must be combined with clinical observations, patient history, and epidemiological information. The expected result is Negative. Fact Sheet for Patients: SugarRoll.be Fact Sheet for Healthcare Providers: https://www.woods-mathews.com/ This test is not yet approved or cleared by the [place] FDA and  has been authorized for detection and/or diagnosis of SARS-CoV-2 by FDA under an Emergency Use Authorization (EUA). This EUA will remain  in effect  (meaning this test can be used) for the duration of the COVID-19 declaration under Section 56 4(b)(1) of the Act, 21 U.S.C. section 360bbb-3(b)(1), unless the authorization is terminated or revoked sooner. Performed at [facility], [place] [address] [place], [place] [address]   Urine culture     Status: Abnormal   Collection Time: 02/10/20  7:10 AM   Specimen: Urine, Catheterized  Result Value Ref Range Status   Specimen Description   Final    URINE, CATHETERIZED Performed at [facility] [address], [place], [place] [address]    Special Requests   Final    NONE Performed at [facility], [place] [address] [place], [place] [address]    Culture (A)  Final    >=100,000 COLONIES/mL ESCHERICHIA COLI Confirmed Extended Spectrum Beta-Lactamase Producer (ESBL).  In bloodstream infections from ESBL organisms, carbapenems are preferred over piperacillin/tazobactam. They are shown to have a lower risk of mortality.    Report Status 02/13/2020 FINAL  Final   Organism ID, Bacteria ESCHERICHIA COLI (A)  Final      Susceptibility   Escherichia coli - MIC*    AMPICILLIN >=32 RESISTANT Resistant     CEFAZOLIN >=64 RESISTANT Resistant     CEFTRIAXONE >=64 RESISTANT Resistant     CIPROFLOXACIN >=4 RESISTANT Resistant     GENTAMICIN <=1 SENSITIVE Sensitive     IMIPENEM <=0.25 SENSITIVE Sensitive     NITROFURANTOIN 64 INTERMEDIATE Intermediate     TRIMETH/SULFA <=20 SENSITIVE Sensitive     AMPICILLIN/SULBACTAM 8 SENSITIVE Sensitive     PIP/TAZO <=4 SENSITIVE Sensitive     * >=100,000 COLONIES/mL ESCHERICHIA COLI    Recent Labs  Lab 02/10/20 0026  LIPASE 98*   No results for input(s): AMMONIA in the last 168 hours.  Cardiac Enzymes: No results for input(s): CKTOTAL, CKMB, CKMBINDEX, TROPONINI in the last 168 hours. BNP (last 3 results) Recent Labs    09/02/19 1727  BNP 1,352.1*    ProBNP (last 3 results) No results for input(s): PROBNP  in the last 8760 hours.  Studies:  [name] EKG SITE RITE  Result Date: 02/13/2020 If Site Rite image not attached, placement could not be confirmed due to current cardiac rhythm.    Admission status: The appropriate admission status for this patient is INPATIENT. Inpatient status is judged to be reasonable and necessary in order to provide the required intensity of service to ensure the  patient's safety. The patient's presenting symptoms, physical exam findings, and initial radiographic and laboratory data in the context of their chronic comorbidities is felt to place them at high risk for further clinical deterioration. Furthermore, it is not anticipated that the patient will be medically stable for discharge from the hospital within 2 midnights of admission. The following factors support the admission status of inpatient.    The patient's presenting symptoms include abdominal pain The worrisome physical exam findings include abdominal tenderness. The initial radiographic and laboratory data are worrisome because of UTI. The chronic co-morbidities include chronic colovesical fistula.    * I certify that at the point of admission it is my clinical judgment that the patient will require inpatient hospital care spanning beyond 2 midnights from the point of admission due to high intensity of service, high risk for further deterioration and high frequency of surveillance required.Oswald Hillock   [facility] If 7PM-7AM, please contact night-coverage at [email or web address], Office  [phone]   02/13/2020, 3:40 PM  LOS: 3 days

## 2020-02-13 NOTE — Progress Notes (Signed)
Discussed PICC line insertions explaining risk and benefits.  Patient states had PICC in past and is reluctant to proceed as has questions such as insurance coverage and care at home.  Concerns Son may not be able to help with PICC infusions at home.  States she is very claustrophobic and concern of sterile drape. Today patient having diarrhea and wish to put off PICC insertion until feeling better and have questions answered.

## 2020-02-13 NOTE — NC FL2 (Signed)
[place] LEVEL OF CARE SCREENING TOOL     IDENTIFICATION  Patient Name: [name] Birthdate: [birth year] Sex: female Admission Date (Current Location): 02/10/2020  [facility] and [place] Number:  Herbalist and Address:  [facility],  [place] [address], [place]      Provider Number: [id number]  Attending Physician Name and Address:  Oswald Hillock, MD  Relative Name and Phone Number:       Current Level of Care: Hospital Recommended Level of Care: [place] Prior Approval Number:    Date Approved/Denied:   PASRR Number: [id number]  Discharge Plan: SNF    Current Diagnoses: Patient Active Problem List   Diagnosis Date Noted  . Cystitis 02/10/2020  . Aortic atherosclerosis ([place]) 09/16/2019  . Acute CHF (congestive heart failure) ([place]) 09/02/2019  . UTI (urinary tract infection) 08/21/2019  . Colovesical fistula 05/28/2019  . Polyneuropathy 11/06/2018  . Goals of care, counseling/discussion   . Hydronephrosis 10/13/2018  . Ovarian mass 10/13/2018  . C. difficile colitis 10/13/2018  . Clostridium difficile colitis 09/24/2018  . Hypokalemia   . Hypomagnesemia   . Left ovarian cyst   . Acute diverticulitis 09/07/2018  . Leucocytosis 09/06/2018  . A-fib ([place]) 09/06/2018  . Malnutrition of moderate degree 07/26/2018  . Perforation of sigmoid colon due to diverticulitis 07/25/2018  . Thrombocytosis ([place]) 07/25/2018  . Sepsis ([place]) 07/24/2018  . Generalized abdominal pain 02/06/2018  . Paresthesia of both lower extremities 02/06/2018  . Chronic kidney disease (CKD), stage III (moderate) 01/07/2018  . Normocytic normochromic anemia 01/07/2018  . Hyperglycemia 01/07/2018  . Iron deficiency 09/19/2017  . Pyoderma gangrenosum 01/04/2017  . Primary osteoarthritis of both hands 01/04/2017  . Primary osteoarthritis of both feet 01/04/2017  . Primary osteoarthritis of both knees 01/04/2017  . Degenerative  joint disease involving multiple joints 01/04/2017  . Neuropathic pain 09/06/2015  . Erythema nodosum 09/21/2014  . Rheumatoid arthritis ([place]) 09/21/2014  . Essential hypertension 09/02/2014  . Rectal bleeding 05/15/2014  . Atrial fibrillation, chronic 05/11/2014  . HLD (hyperlipidemia) 04/02/2014  . Hypothyroidism 04/02/2014  . PAC (premature atrial contraction) 01/23/2014  . Near syncope 01/22/2014  . Varicose veins of lower extremities with other complications [id number]  . Localized edema 06/21/2012  . Venous insufficiency of both lower extremities 04/19/2012  . Leg edema 01/17/2012  . Anxiety 11/05/2011  . Aortic stenosis 03/22/2011    Orientation RESPIRATION BLADDER Height & Weight     Self, Time, Situation, Place  Normal Continent Weight:   Height:     BEHAVIORAL SYMPTOMS/MOOD NEUROLOGICAL BOWEL NUTRITION STATUS      Continent Diet(see dc summary)  AMBULATORY STATUS COMMUNICATION OF NEEDS Skin   Limited Assist Verbally Normal                       Personal Care Assistance Level of Assistance  Bathing, Feeding, Dressing Bathing Assistance: Limited assistance Feeding assistance: Independent Dressing Assistance: Limited assistance     Functional Limitations Info  Sight, Hearing, Speech Sight Info: Adequate Hearing Info: Adequate Speech Info: Adequate    SPECIAL CARE FACTORS FREQUENCY                       Contractures Contractures Info: Not present    Additional Factors Info  Code Status, Allergies, Psychotropic Code Status Info: Full Allergies Info: Infliximab, Aspirin, Carvedilol, Codeine, Penicillins Psychotropic Info: xanax  Current Medications (02/13/2020):  This is the current hospital active medication list Current Facility-Administered Medications  Medication Dose Route Frequency Provider Last Rate Last Admin  . 0.9 %  sodium chloride infusion   Intravenous Continuous Oswald Hillock, MD 100 mL/hr at 02/13/20 1617 Rate Change at  02/13/20 1617  . acetaminophen (TYLENOL) tablet 1,000 mg  1,000 mg Oral Q8H PRN Truddie Hidden, MD   1,000 mg at 02/13/20 1531  . ALPRAZolam Duanne Moron) tablet 0.25 mg  0.25 mg Oral BID PRN Truddie Hidden, MD   0.25 mg at 02/12/20 2110  . aspirin chewable tablet 81 mg  81 mg Oral Daily Truddie Hidden, MD   81 mg at 02/13/20 1020  . [START ON 02/14/2020] ertapenem (INVANZ) 500 mg in sodium chloride 0.9 % 50 mL IVPB  500 mg Intravenous Q24H [place], Gagan S, MD      . ferrous sulfate tablet 325 mg  325 mg Oral BID WC Darrick Meigs, Gagan S, MD      . gabapentin (NEURONTIN) capsule 100 mg  100 mg Oral QHS Truddie Hidden, MD   100 mg at 02/12/20 2110  . heparin injection 5,000 Units  5,000 Units Subcutaneous Q12H Truddie Hidden, MD   5,000 Units at 02/13/20 1021  . HYDROmorphone (DILAUDID) injection 0.5 mg  0.5 mg Intravenous Q3H PRN Truddie Hidden, MD   0.5 mg at 02/12/20 2244  . levothyroxine (SYNTHROID) tablet 75 mcg  75 mcg Oral QAC breakfast Truddie Hidden, MD   75 mcg at 02/13/20 0547  . meropenem (MERREM) 500 mg in sodium chloride 0.9 % 100 mL IVPB  500 mg Intravenous Q12H Oswald Hillock, MD 200 mL/hr at 02/13/20 1255 500 mg at 02/13/20 1255  . metoprolol succinate (TOPROL-XL) 24 hr tablet 75 mg  75 mg Oral Daily Truddie Hidden, MD   75 mg at 02/13/20 1021  . multivitamin with minerals tablet 1 tablet  1 tablet Oral Daily Truddie Hidden, MD      . ondansetron [facility]) tablet 4 mg  4 mg Oral Q6H PRN Truddie Hidden, MD       Or  . ondansetron [facility]) injection 4 mg  4 mg Intravenous Q6H PRN Truddie Hidden, MD   4 mg at 02/11/20 2121  . sodium bicarbonate tablet 650 mg  650 mg Oral BID [place], Gagan S, MD      . sodium chloride flush (NS) 0.9 % injection 3 mL  3 mL Intravenous Once Ripley Fraise, MD         Discharge Medications: Please see discharge summary for a list of discharge medications.  Relevant Imaging Results:  Relevant Lab Results:   Additional Information SSN: [place], [place]

## 2020-02-14 ENCOUNTER — Inpatient Hospital Stay (HOSPITAL_COMMUNITY): Payer: Medicare Other

## 2020-02-14 DIAGNOSIS — R109 Unspecified abdominal pain: Secondary | ICD-10-CM | POA: Diagnosis not present

## 2020-02-14 DIAGNOSIS — N3090 Cystitis, unspecified without hematuria: Secondary | ICD-10-CM | POA: Diagnosis not present

## 2020-02-14 LAB — BASIC METABOLIC PANEL
Anion gap: 8 (ref 5–15)
BUN: 25 mg/dL — ABNORMAL HIGH (ref 8–23)
CO2: 15 mmol/L — ABNORMAL LOW (ref 22–32)
Calcium: 8.3 mg/dL — ABNORMAL LOW (ref 8.9–10.3)
Chloride: 117 mmol/L — ABNORMAL HIGH (ref 98–111)
Creatinine, Ser: 1.54 mg/dL — ABNORMAL HIGH (ref 0.44–1.00)
GFR calc Af Amer: 34 mL/min — ABNORMAL LOW (ref >60)
GFR calc non Af Amer: 29 mL/min — ABNORMAL LOW (ref >60)
Glucose, Bld: 102 mg/dL — ABNORMAL HIGH (ref 70–99)
Potassium: 3.9 mmol/L (ref 3.5–5.1)
Sodium: 140 mmol/L (ref 135–145)

## 2020-02-14 LAB — C DIFFICILE (CDIFF) QUICK SCRN (NO PCR REFLEX)
C Diff antigen: NEGATIVE
C Diff interpretation: NEGATIVE
C Diff toxin: NEGATIVE

## 2020-02-14 LAB — CBC
HCT: 26.2 % — ABNORMAL LOW (ref 36.0–46.0)
Hemoglobin: 7.8 g/dL — ABNORMAL LOW (ref 12.0–15.0)
MCH: 27.6 pg (ref 26.0–34.0)
MCHC: 29.8 g/dL — ABNORMAL LOW (ref 30.0–36.0)
MCV: 92.6 fL (ref 80.0–100.0)
Platelets: 359 10*3/uL (ref 150–400)
RBC: 2.83 MIL/uL — ABNORMAL LOW (ref 3.87–5.11)
RDW: 18.7 % — ABNORMAL HIGH (ref 11.5–15.5)
WBC: 12.2 10*3/uL — ABNORMAL HIGH (ref 4.0–10.5)
nRBC: 0.2 % (ref 0.0–0.2)

## 2020-02-14 LAB — OCCULT BLOOD X 1 CARD TO LAB, STOOL: Fecal Occult Bld: POSITIVE — AB

## 2020-02-14 LAB — GLUCOSE, CAPILLARY: Glucose-Capillary: 134 mg/dL — ABNORMAL HIGH (ref 70–99)

## 2020-02-14 MED ORDER — LACTATED RINGERS IV SOLN: INTRAVENOUS | Status: DC

## 2020-02-14 MED ORDER — SODIUM CHLORIDE 0.9% FLUSH: 10.0000 mL | INTRAVENOUS | Status: DC | PRN

## 2020-02-14 MED ORDER — LOPERAMIDE HCL 2 MG PO CAPS
2.0000 mg | ORAL_CAPSULE | ORAL | Status: DC | PRN
  Administered 2020-02-14 – 2020-02-15 (×2): 2 mg via ORAL
  Filled 2020-02-14 (×2): qty 1

## 2020-02-14 MED ORDER — CHLORHEXIDINE GLUCONATE CLOTH 2 % EX PADS
6.0000 | MEDICATED_PAD | Freq: Every day | CUTANEOUS | Status: DC
  Administered 2020-02-14 – 2020-02-16 (×3): 6 via TOPICAL

## 2020-02-14 MED ORDER — LORAZEPAM 2 MG/ML IJ SOLN
0.5000 mg | Freq: Once | INTRAMUSCULAR | Status: AC
  Administered 2020-02-14: 0.5 mg via INTRAVENOUS
  Filled 2020-02-14: qty 1

## 2020-02-14 NOTE — Progress Notes (Signed)
Spoke with Olene Craven RN and Dr Darrick Meigs re PICC placement.  No solid d/c orders at this time, pt with recent episode of loose BM, RN and MD to reinforce need of PICC to pt today.   RN to communicate any change in plans re PICC placement/d/c to SNF etc.Will continue to hold off PICC until later.

## 2020-02-14 NOTE — TOC Progression Note (Signed)
Transition of Care [facility]) - Progression Note    Patient Details  Name: [name] MRN: [id number] Date of Birth: November 19, 1926  Transition of Care [facility]) CM/SW [place], [place] Phone Number: 02/14/2020, 2:05 PM  Clinical Narrative:    CSW confirmed bed at [facility] on Monday. Patient will need a covid test  No more than 48 hours prior to discharge. PICC line to be placed.  Patient son notified.    Expected Discharge Plan: [place] Barriers to Discharge: Continued Medical Work up  Expected Discharge Plan and Services Expected Discharge Plan: [place] In-house Referral: Clinical Social Work Discharge Planning Services: CM Consult Post Acute Care Choice: [place] arrangements for the past 2 months: [place]: RN, PT, IV Antibiotics HH Agency: [place] ([facility]) Date [place]: 02/13/20   Representative spoke with at [place]: [place] ([facility]) Interventions    Readmission Risk Interventions Readmission Risk Prevention Plan 09/03/2019  Transportation Screening Complete  PCP or Specialist Appt within 3-5 Days Complete  HRI or [place] Complete  Social Work Consult for [place] Planning/Counseling Complete  Palliative Care Screening Not Applicable  Medication Review Press photographer) Complete  Some recent data might be hidden

## 2020-02-14 NOTE — Progress Notes (Signed)
Peripherally Inserted Central Catheter Placement  The IV Nurse has discussed with the patient and/or persons authorized to consent for the patient, the purpose of this procedure and the potential benefits and risks involved with this procedure.  The benefits include less needle sticks, lab draws from the catheter, and the patient may be discharged home with the catheter. Risks include, but not limited to, infection, bleeding, blood clot (thrombus formation), and puncture of an artery; nerve damage and irregular heartbeat and possibility to perform a PICC exchange if needed/ordered by physician.  Alternatives to this procedure were also discussed.  Bard Power PICC patient education guide, fact sheet on infection prevention and patient information card has been provided to patient /or left at bedside.  Premed already given to pt, pt lethargic for consent.  Telephone consent obtained from son, Jacqulynn Cadet.  Pt agreeable at bedside.  PICC Placement Documentation  PICC Single Lumen [id number] PICC Right Basilic 35 cm 0 cm (Active)  Indication for Insertion or Continuance of Line Home intravenous therapies (PICC only) 02/14/20 1637  Exposed Catheter (cm) 0 cm 02/14/20 1637  Site Assessment Clean;Dry;Intact 02/14/20 1637  Line Status Flushed;Saline locked;Blood return noted 02/14/20 1637  Dressing Type Transparent 02/14/20 1637  Dressing Status Clean;Dry;Intact;Antimicrobial disc in place 02/14/20 [place] checked and tightened 02/14/20 1637  Line Adjustment (NICU/IV Team Only) No 02/14/20 1637  Dressing Intervention New dressing 02/14/20 1637  Dressing Change Due 02/21/20 02/14/20 1637       Rolena Infante 02/14/2020, 4:38 PM

## 2020-02-14 NOTE — Progress Notes (Signed)
Cdiff testing negative. Enteric precautions discontinued and placed on contact precautions d/t ESBL in the urine.

## 2020-02-14 NOTE — Progress Notes (Signed)
[facility]  PROGRESS NOTE  [name] [id number] DOB: [birth year] DOA: 02/10/2020 PCP: [name], NP   Brief HPI:   86 year old female with history of atrial fibrillation, not on anticoagulation (patient refused to be on anticoagulation), aortic stenosis, iron deficiency anemia, hypertension, hyperlipidemia, hypothyroidism, rheumatoid arthritis, colovesical fistula came to hospital with acute on chronic worsening abdominal pain.  Patient has been followed by urology as outpatient.  She has refused surgery.  Patient reported that she has been dealing with a low abdominal pain due to fistula for many years but the pain has become more intense over the past 3 weeks.  Also reported constant burning with urination.  Subjective   Patient seen and examined, urine culture growing ESBL E. coli.  Started on meropenem.  She refused PICC line yesterday, but is agreeable to get PICC line to go to skilled nursing facility to complete IV antibiotics.   Assessment/Plan:    1. Acute cystitis/chronic colovesical fistula/chronic left urinary tract stricture-CT of the abdomen pelvis revealed previously noted fistula, impression of underlying bladder neoplasm cannot be excluded.  Bladder wall does show thickening with gas.  Also showed chronic hydronephrosis.  Patient declined procedural evaluation.  Patient was started on cefepime and Flagyl.  Urine culture grew ESBL E. coli.  Switch to meropenem.  Will order PICC line again, patient will be discharged to skilled nursing facility on ertapenem for total of 10 days of therapy.    Urology has nothing new to offer.  Patient does not want surgery. 2. Permanent atrial fibrillation with RVR-patient has declined anticoagulation in the past. 3. Severe aortic stenosis-patient has seen cardiology in the past at was felt to be cirrhotic stenosis.  Patient will follow up with cardiology as outpatient for discussion for TAVR. 4. Normocytic  anemia-patient's hemoglobin is 7.8 today, she presented with hemoglobin of 8.8, likely anemia of chronic disease and iron deficiency anemia..  Her previous hemoglobin was 11.0 on 01/25/2020.  Iron is 25, TIBC 302, saturation 8%, folate 21.8, vitamin B12 1,388.  We will start her on ferrous sulfate 325 mg p.o. twice daily.  Will obtain FOBT. 5. Acute kidney injury on CKD stage III-patient baseline creatinine is around 1.3, creatinine went up to 1.8.  Today creatinine is down to 1.54.  Likely hypotension induced ischemic injury, ATN.  6. Metabolic acidosis-bicarb is 15, likely hyperchloremic metabolic acidosis.  We will switch IV fluids to Ringer lactate.  Continue sodium bicarb 650 mg po bid. 7. Stable 6.2 cm left adnexal mass-stable complex lobulated 6.2 cm cystic left adnexal mass, indeterminate for cystic ovarian neoplasm.  Patient will need pelvic ultrasound as outpatient.  Discussed with patient, she does not want further evaluation for left adnexal mass.   8. Hypothyroidism-continue levothyroxine 9. Peripheral neuropathy-continue gabapentin     SpO2: 100 % O2 Flow Rate (L/min): 2 L/min   COVID-19 Labs  Recent Labs    02/11/20 1637  FERRITIN 34    Lab Results  Component Value Date   SARSCOV2NAA NEGATIVE 02/10/2020   SARSCOV2NAA NEGATIVE 09/02/2019   [place] NEGATIVE 08/21/2019     CBG: Recent Labs  Lab 02/11/20 0740 02/12/20 0730 02/13/20 0755 02/14/20 1002  GLUCAP 101* 141* 131* 134*    CBC: Recent Labs  Lab 02/10/20 0026 02/11/20 0549 02/12/20 0603 02/13/20 0537 02/14/20 0615  WBC 10.6* 10.1 10.8* 16.8* 12.2*  HGB 8.8* 7.3* 7.5* 8.1* 7.8*  HCT 28.4* 24.4* 25.2* 27.0* 26.2*  MCV 89.6 92.1 92.6 92.8 92.6  PLT 423* 308  309 344 [id number]    Basic Metabolic Panel: Recent Labs  Lab 02/10/20 0026 02/11/20 0549 02/12/20 0603 02/13/20 0537 02/14/20 0615  NA 138 136 139 138 140  K 4.3 5.0 4.6 4.4 3.9  CL 111 112* 114* 115* 117*  CO2 18* 17* 17* 17* 15*   GLUCOSE 105* 108* 154* 134* 102*  BUN 26* 28* 34* 29* 25*  CREATININE 1.39* 1.67* 1.81* 1.57* 1.54*  CALCIUM 8.9 8.3* 8.4* 8.4* 8.3*     Liver Function Tests: Recent Labs  Lab 02/10/20 0026  AST 24  ALT 15  ALKPHOS 101  BILITOT 0.6  PROT 7.8  ALBUMIN 3.6      DVT prophylaxis: Heparin  Code Status: Full code  Family Communication: No family at bedside  Disposition Plan: l patient mated with low abdominal pain, has chronic colovesical fistula.  Likely from cystitis.  Started on IV antibiotics.  Barrier to discharge-pending urine culture results.      Scheduled medications:  . aspirin  81 mg Oral Daily  . ferrous sulfate  325 mg Oral BID WC  . gabapentin  100 mg Oral QHS  . heparin  5,000 Units Subcutaneous Q12H  . levothyroxine  75 mcg Oral QAC breakfast  . LORazepam  0.5 mg Intravenous Once  . metoprolol succinate  75 mg Oral Daily  . multivitamin with minerals  1 tablet Oral Daily  . sodium bicarbonate  650 mg Oral BID  . sodium chloride flush  3 mL Intravenous Once    Consultants:  None   Procedures:  None   Antibiotics:   Anti-infectives (From admission, onward)   Start     Dose/Rate Route Frequency Ordered Stop   02/14/20 1000  ertapenem (INVANZ) 500 mg in sodium chloride 0.9 % 50 mL IVPB     500 mg 100 mL/hr over 30 Minutes Intravenous Every 24 hours 02/13/20 1525     02/13/20 1000  meropenem (MERREM) 500 mg in sodium chloride 0.9 % 100 mL IVPB     500 mg 200 mL/hr over 30 Minutes Intravenous Every 12 hours 02/13/20 0918 02/14/20 0700   02/11/20 1000  ceFEPIme (MAXIPIME) 2 g in sodium chloride 0.9 % 100 mL IVPB  Status:  Discontinued     2 g 200 mL/hr over 30 Minutes Intravenous Every 24 hours 02/10/20 1506 02/13/20 0910   02/10/20 2300  metroNIDAZOLE (FLAGYL) IVPB 500 mg  Status:  Discontinued     500 mg 100 mL/hr over 60 Minutes Intravenous Every 8 hours 02/10/20 1505 02/13/20 0910   02/10/20 1515  metroNIDAZOLE (FLAGYL) IVPB 500 mg      500 mg 100 mL/hr over 60 Minutes Intravenous STAT 02/10/20 1504 02/10/20 1655   02/10/20 0845  ceFEPIme (MAXIPIME) 2 g in sodium chloride 0.9 % 100 mL IVPB     2 g 200 mL/hr over 30 Minutes Intravenous  Once 02/10/20 0835 02/10/20 1049       Objective   Vitals:   02/13/20 1113 02/13/20 1434 02/13/20 2109 02/14/20 0554  BP: 134/74 117/86 115/68 134/81  Pulse: 72 99 87 82  Resp:  20 16 20   Temp: 98 F (36.7 C) 98.6 F (37 C) 97.9 F (36.6 C) 98 F (36.7 C)  TempSrc: Oral Oral    SpO2: 96% 100% 100% 100%    Intake/Output Summary (Last 24 hours) at 02/14/2020 1042 Last data filed at 02/13/2020 1800 Gross per 24 hour  Intake 2805.02 ml  Output 200 ml  Net 2605.02 ml  04/15 1901 - 04/17 0700 In: 2805 [I.V.:2703.1] Out: 200 [Urine:200]  There were no vitals filed for this visit.  Physical Examination:   General-appears in no acute distress Heart-S1-S2, regular, no murmur auscultated Lungs-clear to auscultation bilaterally, no wheezing or crackles auscultated Abdomen-soft, nontender, no organomegaly Extremities-no edema in the lower extremities Neuro-alert, oriented x3, no focal deficit noted   Data Reviewed:   Recent Results (from the past 240 hour(s))  SARS CORONAVIRUS 2 (TAT 6-24 HRS) Nasopharyngeal     Status: None   Collection Time: 02/10/20  6:29 AM   Specimen: Nasopharyngeal  Result Value Ref Range Status   SARS Coronavirus 2 NEGATIVE NEGATIVE Final    Comment: (NOTE) SARS-CoV-2 target nucleic acids are NOT DETECTED. The SARS-CoV-2 RNA is generally detectable in upper and lower respiratory specimens during the acute phase of infection. Negative results do not preclude SARS-CoV-2 infection, do not rule out co-infections with other pathogens, and should not be used as the sole basis for treatment or other patient management decisions. Negative results must be combined with clinical observations, patient history, and epidemiological information. The  expected result is Negative. Fact Sheet for Patients: SugarRoll.be Fact Sheet for Healthcare Providers: https://www.woods-mathews.com/ This test is not yet approved or cleared by the [place] FDA and  has been authorized for detection and/or diagnosis of SARS-CoV-2 by FDA under an Emergency Use Authorization (EUA). This EUA will remain  in effect (meaning this test can be used) for the duration of the COVID-19 declaration under Section 56 4(b)(1) of the Act, 21 U.S.C. section 360bbb-3(b)(1), unless the authorization is terminated or revoked sooner. Performed at [facility], [place] [address], [place], [place] [address]   Urine culture     Status: Abnormal   Collection Time: 02/10/20  7:10 AM   Specimen: Urine, Catheterized  Result Value Ref Range Status   Specimen Description   Final    URINE, CATHETERIZED Performed at [facility] [address], [place], [place] [address]    Special Requests   Final    NONE Performed at [facility], [place] [address], [place], [place] [address]    Culture (A)  Final    >=100,000 COLONIES/mL ESCHERICHIA COLI Confirmed Extended Spectrum Beta-Lactamase Producer (ESBL).  In bloodstream infections from ESBL organisms, carbapenems are preferred over piperacillin/tazobactam. They are shown to have a lower risk of mortality.    Report Status 02/13/2020 FINAL  Final   Organism ID, Bacteria ESCHERICHIA COLI (A)  Final      Susceptibility   Escherichia coli - MIC*    AMPICILLIN >=32 RESISTANT Resistant     CEFAZOLIN >=64 RESISTANT Resistant     CEFTRIAXONE >=64 RESISTANT Resistant     CIPROFLOXACIN >=4 RESISTANT Resistant     GENTAMICIN <=1 SENSITIVE Sensitive     IMIPENEM <=0.25 SENSITIVE Sensitive     NITROFURANTOIN 64 INTERMEDIATE Intermediate     TRIMETH/SULFA <=20 SENSITIVE Sensitive     AMPICILLIN/SULBACTAM 8 SENSITIVE Sensitive     PIP/TAZO <=4 SENSITIVE  Sensitive     * >=100,000 COLONIES/mL ESCHERICHIA COLI  C Difficile Quick Screen (NO PCR Reflex)     Status: None   Collection Time: 02/14/20  6:00 AM   Specimen: STOOL  Result Value Ref Range Status   C Diff antigen NEGATIVE NEGATIVE Final   C Diff toxin NEGATIVE NEGATIVE Final   C Diff interpretation NEGATIVE  Corrected    Comment: VALID Performed at [facility]  [address] [place], [place] [address] CORRECTED ON 04/17 AT [id number]: PREVIOUSLY REPORTED AS VALID     Recent Labs  Lab 02/10/20 0026  LIPASE 98*   No results for input(s): AMMONIA in the last 168 hours.  Cardiac Enzymes: No results for input(s): CKTOTAL, CKMB, CKMBINDEX, TROPONINI in the last 168 hours. BNP (last 3 results) Recent Labs    09/02/19 1727  BNP 1,352.1*    ProBNP (last 3 results) No results for input(s): PROBNP in the last 8760 hours.  Studies:  [name] EKG SITE RITE  Result Date: 02/13/2020 If Site Rite image not attached, placement could not be confirmed due to current cardiac rhythm.    Admission status: The appropriate admission status for this patient is INPATIENT. Inpatient status is judged to be reasonable and necessary in order to provide the required intensity of service to ensure the patient's safety. The patient's presenting symptoms, physical exam findings, and initial radiographic and laboratory data in the context of their chronic comorbidities is felt to place them at high risk for further clinical deterioration. Furthermore, it is not anticipated that the patient will be medically stable for discharge from the hospital within 2 midnights of admission. The following factors support the admission status of inpatient.    The patient's presenting symptoms include abdominal pain The worrisome physical exam findings include abdominal tenderness. The initial radiographic and laboratory data are worrisome because of UTI. The chronic co-morbidities include chronic  colovesical fistula.    * I certify that at the point of admission it is my clinical judgment that the patient will require inpatient hospital care spanning beyond 2 midnights from the point of admission due to high intensity of service, high risk for further deterioration and high frequency of surveillance required.Oswald Hillock   [facility] If 7PM-7AM, please contact night-coverage at [email or web address], Office  [phone]   02/14/2020, 10:42 AM  LOS: 4 days

## 2020-02-15 ENCOUNTER — Inpatient Hospital Stay (HOSPITAL_COMMUNITY): Payer: Medicare Other

## 2020-02-15 DIAGNOSIS — R109 Unspecified abdominal pain: Secondary | ICD-10-CM | POA: Diagnosis not present

## 2020-02-15 DIAGNOSIS — N3090 Cystitis, unspecified without hematuria: Secondary | ICD-10-CM | POA: Diagnosis not present

## 2020-02-15 LAB — CREATININE, SERUM
Creatinine, Ser: 1.18 mg/dL — ABNORMAL HIGH (ref 0.44–1.00)
GFR calc Af Amer: 47 mL/min — ABNORMAL LOW (ref >60)
GFR calc non Af Amer: 41 mL/min — ABNORMAL LOW (ref >60)

## 2020-02-15 LAB — GLUCOSE, CAPILLARY: Glucose-Capillary: 100 mg/dL — ABNORMAL HIGH (ref 70–99)

## 2020-02-15 MED ORDER — IPRATROPIUM-ALBUTEROL 0.5-2.5 (3) MG/3ML IN SOLN: 3.0000 mL | RESPIRATORY_TRACT | Status: DC | PRN

## 2020-02-15 NOTE — Progress Notes (Signed)
[facility]  PROGRESS NOTE  [name] [id number] DOB: October 08, 1927 DOA: 02/10/2020 PCP: [name], NP   Brief HPI:   86 year old female with history of atrial fibrillation, not on anticoagulation (patient refused to be on anticoagulation), aortic stenosis, iron deficiency anemia, hypertension, hyperlipidemia, hypothyroidism, rheumatoid arthritis, colovesical fistula came to hospital with acute on chronic worsening abdominal pain.  Patient has been followed by urology as outpatient.  She has refused surgery.  Patient reported that she has been dealing with a low abdominal pain due to fistula for many years but the pain has become more intense over the past 3 weeks.  Also reported constant burning with urination.  Subjective   Patient seen and examined, urine culture growing ESBL E. coli. Patient started on meropenem. Awaiting to go to skilled nursing facility. PICC line   Assessment/Plan:    1. Acute cystitis/chronic colovesical fistula/chronic left urinary tract stricture-CT of the abdomen pelvis revealed previously noted fistula, impression of underlying bladder neoplasm cannot be excluded.  Bladder wall does show thickening with gas.  Also showed chronic hydronephrosis.  Patient declined procedural evaluation.  Patient was started on cefepime and Flagyl.  Urine culture grew ESBL E. coli.  Switch to meropenem.  Will order PICC line again, patient will be discharged to skilled nursing facility on ertapenem for total of 10 days of therapy.    Urology has nothing new to offer.  Patient does not want surgery. 2. Permanent atrial fibrillation with RVR-patient has declined anticoagulation in the past. 3. Severe aortic stenosis-patient has seen cardiology in the past at was felt to be cirrhotic stenosis.  Patient will follow up with cardiology as outpatient for discussion for TAVR. 4. Normocytic anemia-patient's hemoglobin is 7.8 today, she presented with hemoglobin of 8.8,  likely anemia of chronic disease and iron deficiency anemia..  Her previous hemoglobin was 11.0 on 01/25/2020.  Iron is 25, TIBC 302, saturation 8%, folate 21.8, vitamin B12 1,388.  We will start her on ferrous sulfate 325 mg p.o. twice daily.  Will obtain FOBT. 5. Acute kidney injury on CKD stage III-patient baseline creatinine is around 1.3, creatinine went up to 1.8.  Today creatinine is down to 1.18 likely hypotension induced ischemic injury, ATN. IV fluids were held this morning due to development of fluid overload. We will continue to hold IV fluids as kidney function has improved. 6. Metabolic acidosis-bicarb is 15, likely hyperchloremic metabolic acidosis. Patient was started on Ringer lactate which has been discontinued.   Continue sodium bicarb 650 mg po bid. 7. Stable 6.2 cm left adnexal mass-stable complex lobulated 6.2 cm cystic left adnexal mass, indeterminate for cystic ovarian neoplasm.  Patient will need pelvic ultrasound as outpatient.  Discussed with patient, she does not want further evaluation for left adnexal mass.   8. Hypothyroidism-continue levothyroxine 9. Peripheral neuropathy-continue gabapentin     SpO2: 99 % O2 Flow Rate (L/min): 2 L/min(weaned from 3L to 2L)   COVID-19 Labs  No results for input(s): DDIMER, FERRITIN, LDH, CRP in the last 72 hours.  Lab Results  Component Value Date   SARSCOV2NAA NEGATIVE 02/10/2020   [place] NEGATIVE 09/02/2019   [place] NEGATIVE 08/21/2019     CBG: Recent Labs  Lab 02/11/20 0740 02/12/20 0730 02/13/20 0755 02/14/20 1002 02/15/20 0802  GLUCAP 101* 141* 131* 134* 100*    CBC: Recent Labs  Lab 02/10/20 0026 02/11/20 0549 02/12/20 0603 02/13/20 0537 02/14/20 0615  WBC 10.6* 10.1 10.8* 16.8* 12.2*  HGB 8.8* 7.3* 7.5* 8.1* 7.8*  HCT 28.4* 24.4* 25.2* 27.0* 26.2*  MCV 89.6 92.1 92.6 92.8 92.6  PLT 423* 308 309 344 [id number]    Basic Metabolic Panel: Recent Labs  Lab 02/10/20 0026 02/10/20 0026  02/11/20 0549 02/12/20 0603 02/13/20 0537 02/14/20 0615 02/15/20 0846  NA 138  --  136 139 138 140  --   K 4.3  --  5.0 4.6 4.4 3.9  --   CL 111  --  112* 114* 115* 117*  --   CO2 18*  --  17* 17* 17* 15*  --   GLUCOSE 105*  --  108* 154* 134* 102*  --   BUN 26*  --  28* 34* 29* 25*  --   CREATININE 1.39*   < > 1.67* 1.81* 1.57* 1.54* 1.18*  CALCIUM 8.9  --  8.3* 8.4* 8.4* 8.3*  --    < > = values in this interval not displayed.     Liver Function Tests: Recent Labs  Lab 02/10/20 0026  AST 24  ALT 15  ALKPHOS 101  BILITOT 0.6  PROT 7.8  ALBUMIN 3.6      DVT prophylaxis: Heparin  Code Status: Full code  Family Communication: No family at bedside  Disposition Plan: Patient admitted with low abdominal pain, has chronic colovesical fistula.  Likely from cystitis.  Started on IV antibiotics.  Barrier to discharge-pending urine culture results.      Scheduled medications:  . aspirin  81 mg Oral Daily  . Chlorhexidine Gluconate Cloth  6 each Topical Daily  . ferrous sulfate  325 mg Oral BID WC  . gabapentin  100 mg Oral QHS  . heparin  5,000 Units Subcutaneous Q12H  . levothyroxine  75 mcg Oral QAC breakfast  . metoprolol succinate  75 mg Oral Daily  . multivitamin with minerals  1 tablet Oral Daily  . sodium bicarbonate  650 mg Oral BID  . sodium chloride flush  3 mL Intravenous Once    Consultants:  None   Procedures:  None   Antibiotics:   Anti-infectives (From admission, onward)   Start     Dose/Rate Route Frequency Ordered Stop   02/14/20 1000  ertapenem (INVANZ) 500 mg in sodium chloride 0.9 % 50 mL IVPB     500 mg 100 mL/hr over 30 Minutes Intravenous Every 24 hours 02/13/20 1525     02/13/20 1000  meropenem (MERREM) 500 mg in sodium chloride 0.9 % 100 mL IVPB     500 mg 200 mL/hr over 30 Minutes Intravenous Every 12 hours 02/13/20 0918 02/14/20 0700   02/11/20 1000  ceFEPIme (MAXIPIME) 2 g in sodium chloride 0.9 % 100 mL IVPB  Status:   Discontinued     2 g 200 mL/hr over 30 Minutes Intravenous Every 24 hours 02/10/20 1506 02/13/20 0910   02/10/20 2300  metroNIDAZOLE (FLAGYL) IVPB 500 mg  Status:  Discontinued     500 mg 100 mL/hr over 60 Minutes Intravenous Every 8 hours 02/10/20 1505 02/13/20 0910   02/10/20 1515  metroNIDAZOLE (FLAGYL) IVPB 500 mg     500 mg 100 mL/hr over 60 Minutes Intravenous STAT 02/10/20 1504 02/10/20 1655   02/10/20 0845  ceFEPIme (MAXIPIME) 2 g in sodium chloride 0.9 % 100 mL IVPB     2 g 200 mL/hr over 30 Minutes Intravenous  Once 02/10/20 0835 02/10/20 1049       Objective   Vitals:   02/14/20 0554 02/14/20 1411 02/14/20 2001 02/15/20 0500  BP: 134/81  111/70 129/90 113/80  Pulse: 82 76 89 81  Resp: 20  19 16   Temp: 98 F (36.7 C) 98.6 F (37 C) 98 F (36.7 C) 98.1 F (36.7 C)  TempSrc:  Oral Oral Oral  SpO2: 100% 100% 99% 99%    Intake/Output Summary (Last 24 hours) at 02/15/2020 1426 Last data filed at 02/15/2020 0400 Gross per 24 hour  Intake 1176.5 ml  Output --  Net 1176.5 ml    04/16 1901 - 04/18 0700 In: 1176.5 [I.V.:1126.5] Out: 100 [Urine:100]  There were no vitals filed for this visit.  Physical Examination:   General-appears in no acute distress Heart-S1-S2, regular, no murmur auscultated Lungs-clear to auscultation bilaterally, no wheezing or crackles auscultated Abdomen-soft, nontender, no organomegaly Extremities-no edema in the lower extremities Neuro-alert, oriented x3, no focal deficit noted   Data Reviewed:   Recent Results (from the past 240 hour(s))  SARS CORONAVIRUS 2 (TAT 6-24 HRS) Nasopharyngeal     Status: None   Collection Time: 02/10/20  6:29 AM   Specimen: Nasopharyngeal  Result Value Ref Range Status   SARS Coronavirus 2 NEGATIVE NEGATIVE Final    Comment: (NOTE) SARS-CoV-2 target nucleic acids are NOT DETECTED. The SARS-CoV-2 RNA is generally detectable in upper and lower respiratory specimens during the acute phase of  infection. Negative results do not preclude SARS-CoV-2 infection, do not rule out co-infections with other pathogens, and should not be used as the sole basis for treatment or other patient management decisions. Negative results must be combined with clinical observations, patient history, and epidemiological information. The expected result is Negative. Fact Sheet for Patients: SugarRoll.be Fact Sheet for Healthcare Providers: https://www.woods-mathews.com/ This test is not yet approved or cleared by the [place] FDA and  has been authorized for detection and/or diagnosis of SARS-CoV-2 by FDA under an Emergency Use Authorization (EUA). This EUA will remain  in effect (meaning this test can be used) for the duration of the COVID-19 declaration under Section 56 4(b)(1) of the Act, 21 U.S.C. section 360bbb-3(b)(1), unless the authorization is terminated or revoked sooner. Performed at [facility], [place] [address], [place], [place] [address]   Urine culture     Status: Abnormal   Collection Time: 02/10/20  7:10 AM   Specimen: Urine, Catheterized  Result Value Ref Range Status   Specimen Description   Final    URINE, CATHETERIZED Performed at [place] [address] [place], [place] [address]    Special Requests   Final    NONE Performed at [facility], [place] [address], [place],  [address]    Culture (A)  Final    >=100,000 COLONIES/mL ESCHERICHIA COLI Confirmed Extended Spectrum Beta-Lactamase Producer (ESBL).  In bloodstream infections from ESBL organisms, carbapenems are preferred over piperacillin/tazobactam. They are shown to have a lower risk of mortality.    Report Status 02/13/2020 FINAL  Final   Organism ID, Bacteria ESCHERICHIA COLI (A)  Final      Susceptibility   Escherichia coli - MIC*    AMPICILLIN >=32 RESISTANT Resistant     CEFAZOLIN >=64 RESISTANT Resistant      CEFTRIAXONE >=64 RESISTANT Resistant     CIPROFLOXACIN >=4 RESISTANT Resistant     GENTAMICIN <=1 SENSITIVE Sensitive     IMIPENEM <=0.25 SENSITIVE Sensitive     NITROFURANTOIN 64 INTERMEDIATE Intermediate     TRIMETH/SULFA <=20 SENSITIVE Sensitive     AMPICILLIN/SULBACTAM 8 SENSITIVE Sensitive     PIP/TAZO <=4 SENSITIVE  Sensitive     * >=100,000 COLONIES/mL ESCHERICHIA COLI  C Difficile Quick Screen (NO PCR Reflex)     Status: None   Collection Time: 02/14/20  6:00 AM   Specimen: STOOL  Result Value Ref Range Status   C Diff antigen NEGATIVE NEGATIVE Final   C Diff toxin NEGATIVE NEGATIVE Final   C Diff interpretation NEGATIVE  Corrected    Comment: VALID Performed at [facility] [address], [place],  [address] CORRECTED ON 04/17 AT [id number]: PREVIOUSLY REPORTED AS VALID     Recent Labs  Lab 02/10/20 0026  LIPASE 98*   No results for input(s): AMMONIA in the last 168 hours.  Cardiac Enzymes: No results for input(s): CKTOTAL, CKMB, CKMBINDEX, TROPONINI in the last 168 hours. BNP (last 3 results) Recent Labs    09/02/19 1727  BNP 1,352.1*    ProBNP (last 3 results) No results for input(s): PROBNP in the last 8760 hours.  Studies:  DG CHEST PORT 1 VIEW  Result Date: 02/15/2020 CLINICAL DATA:  Wheezing. EXAM: PORTABLE CHEST 1 VIEW COMPARISON:  1 day prior FINDINGS: Patient rotated right. Mild cardiomegaly. Hiatal hernia. Possible tiny right pleural effusion. No pneumothorax. Right PICC line tip at caval atrial junction. Suspect developing mild pulmonary venous congestion. Persistent bibasilar atelectasis. Numerous leads and wires project over the chest. IMPRESSION: 1. Cardiomegaly with suspicion of developing mild pulmonary venous congestion. Possible layering right pleural effusion. 2. Similar bibasilar atelectasis. 3. Hiatal hernia. Electronically Signed   By: Abigail Miyamoto M.D.   On: 02/15/2020 08:07   DG CHEST PORT 1 VIEW  Result Date:  02/14/2020 CLINICAL DATA:  PICC placement EXAM: PORTABLE CHEST 1 VIEW COMPARISON:  02/10/2020 chest radiograph. FINDINGS: Right PICC terminates over the cavoatrial junction. Stable cardiomediastinal silhouette with mild cardiomegaly and moderate hiatal hernia. No pneumothorax. No pleural effusion. No overt pulmonary edema. Mild medial bibasilar atelectasis. IMPRESSION: 1. Right PICC terminates over the cavoatrial junction. 2. Stable mild cardiomegaly without overt pulmonary edema. 3. Mild medial bibasilar atelectasis. Moderate hiatal hernia. Electronically Signed   By: Ilona Sorrel M.D.   On: 02/14/2020 17:13     Admission status: The appropriate admission status for this patient is INPATIENT. Inpatient status is judged to be reasonable and necessary in order to provide the required intensity of service to ensure the patient's safety. The patient's presenting symptoms, physical exam findings, and initial radiographic and laboratory data in the context of their chronic comorbidities is felt to place them at high risk for further clinical deterioration. Furthermore, it is not anticipated that the patient will be medically stable for discharge from the hospital within 2 midnights of admission. The following factors support the admission status of inpatient.    The patient's presenting symptoms include abdominal pain The worrisome physical exam findings include abdominal tenderness. The initial radiographic and laboratory data are worrisome because of UTI. The chronic co-morbidities include chronic colovesical fistula.    * I certify that at the point of admission it is my clinical judgment that the patient will require inpatient hospital care spanning beyond 2 midnights from the point of admission due to high intensity of service, high risk for further deterioration and high frequency of surveillance required.Oswald Hillock   [facility] If 7PM-7AM, please contact night-coverage at  [email or web address], Office  [phone]   02/15/2020, 2:26 PM  LOS: 5 days

## 2020-02-15 NOTE — Progress Notes (Signed)
Pt with significant wheeze and SOB while I was repositioning her this morning. I have been weaning O2 during the night with anticipation of d/c Monday. She remains 99% on 2LNC. Pt with hx of CHF and receiving IVF @ 75/hr. Provider made aware.

## 2020-02-16 DIAGNOSIS — R000 Tachycardia, unspecified: Secondary | ICD-10-CM | POA: Diagnosis not present

## 2020-02-16 DIAGNOSIS — R531 Weakness: Secondary | ICD-10-CM | POA: Diagnosis not present

## 2020-02-16 DIAGNOSIS — N1330 Unspecified hydronephrosis: Secondary | ICD-10-CM | POA: Diagnosis not present

## 2020-02-16 DIAGNOSIS — R2681 Unsteadiness on feet: Secondary | ICD-10-CM | POA: Diagnosis not present

## 2020-02-16 DIAGNOSIS — N183 Chronic kidney disease, stage 3 unspecified: Secondary | ICD-10-CM | POA: Diagnosis not present

## 2020-02-16 DIAGNOSIS — B9620 Unspecified Escherichia coli [E. coli] as the cause of diseases classified elsewhere: Secondary | ICD-10-CM | POA: Diagnosis not present

## 2020-02-16 DIAGNOSIS — E872 Acidosis: Secondary | ICD-10-CM | POA: Diagnosis not present

## 2020-02-16 DIAGNOSIS — N3000 Acute cystitis without hematuria: Secondary | ICD-10-CM | POA: Diagnosis not present

## 2020-02-16 DIAGNOSIS — E039 Hypothyroidism, unspecified: Secondary | ICD-10-CM | POA: Diagnosis not present

## 2020-02-16 DIAGNOSIS — N131 Hydronephrosis with ureteral stricture, not elsewhere classified: Secondary | ICD-10-CM | POA: Diagnosis not present

## 2020-02-16 DIAGNOSIS — N3090 Cystitis, unspecified without hematuria: Secondary | ICD-10-CM | POA: Diagnosis not present

## 2020-02-16 DIAGNOSIS — I5041 Acute combined systolic (congestive) and diastolic (congestive) heart failure: Secondary | ICD-10-CM | POA: Diagnosis not present

## 2020-02-16 DIAGNOSIS — N179 Acute kidney failure, unspecified: Secondary | ICD-10-CM | POA: Diagnosis not present

## 2020-02-16 DIAGNOSIS — R109 Unspecified abdominal pain: Secondary | ICD-10-CM | POA: Diagnosis not present

## 2020-02-16 DIAGNOSIS — R279 Unspecified lack of coordination: Secondary | ICD-10-CM | POA: Diagnosis not present

## 2020-02-16 DIAGNOSIS — Z452 Encounter for adjustment and management of vascular access device: Secondary | ICD-10-CM | POA: Diagnosis not present

## 2020-02-16 DIAGNOSIS — M792 Neuralgia and neuritis, unspecified: Secondary | ICD-10-CM | POA: Diagnosis not present

## 2020-02-16 DIAGNOSIS — D649 Anemia, unspecified: Secondary | ICD-10-CM | POA: Diagnosis not present

## 2020-02-16 DIAGNOSIS — J069 Acute upper respiratory infection, unspecified: Secondary | ICD-10-CM | POA: Diagnosis not present

## 2020-02-16 DIAGNOSIS — D508 Other iron deficiency anemias: Secondary | ICD-10-CM | POA: Diagnosis not present

## 2020-02-16 DIAGNOSIS — N838 Other noninflammatory disorders of ovary, fallopian tube and broad ligament: Secondary | ICD-10-CM | POA: Diagnosis not present

## 2020-02-16 DIAGNOSIS — M6281 Muscle weakness (generalized): Secondary | ICD-10-CM | POA: Diagnosis not present

## 2020-02-16 DIAGNOSIS — I129 Hypertensive chronic kidney disease with stage 1 through stage 4 chronic kidney disease, or unspecified chronic kidney disease: Secondary | ICD-10-CM | POA: Diagnosis not present

## 2020-02-16 DIAGNOSIS — I10 Essential (primary) hypertension: Secondary | ICD-10-CM | POA: Diagnosis not present

## 2020-02-16 DIAGNOSIS — N1832 Chronic kidney disease, stage 3b: Secondary | ICD-10-CM | POA: Diagnosis not present

## 2020-02-16 DIAGNOSIS — N3592 Unspecified urethral stricture, female: Secondary | ICD-10-CM | POA: Diagnosis not present

## 2020-02-16 DIAGNOSIS — I872 Venous insufficiency (chronic) (peripheral): Secondary | ICD-10-CM | POA: Diagnosis not present

## 2020-02-16 DIAGNOSIS — I482 Chronic atrial fibrillation, unspecified: Secondary | ICD-10-CM | POA: Diagnosis not present

## 2020-02-16 DIAGNOSIS — J810 Acute pulmonary edema: Secondary | ICD-10-CM | POA: Diagnosis not present

## 2020-02-16 DIAGNOSIS — Z743 Need for continuous supervision: Secondary | ICD-10-CM | POA: Diagnosis not present

## 2020-02-16 DIAGNOSIS — I4821 Permanent atrial fibrillation: Secondary | ICD-10-CM | POA: Diagnosis not present

## 2020-02-16 DIAGNOSIS — R55 Syncope and collapse: Secondary | ICD-10-CM | POA: Diagnosis not present

## 2020-02-16 DIAGNOSIS — G629 Polyneuropathy, unspecified: Secondary | ICD-10-CM | POA: Diagnosis not present

## 2020-02-16 DIAGNOSIS — N189 Chronic kidney disease, unspecified: Secondary | ICD-10-CM | POA: Diagnosis not present

## 2020-02-16 DIAGNOSIS — I350 Nonrheumatic aortic (valve) stenosis: Secondary | ICD-10-CM | POA: Diagnosis not present

## 2020-02-16 DIAGNOSIS — N321 Vesicointestinal fistula: Secondary | ICD-10-CM | POA: Diagnosis not present

## 2020-02-16 DIAGNOSIS — R41841 Cognitive communication deficit: Secondary | ICD-10-CM | POA: Diagnosis not present

## 2020-02-16 LAB — CREATININE, SERUM
Creatinine, Ser: 1.08 mg/dL — ABNORMAL HIGH (ref 0.44–1.00)
GFR calc Af Amer: 52 mL/min — ABNORMAL LOW (ref >60)
GFR calc non Af Amer: 45 mL/min — ABNORMAL LOW (ref >60)

## 2020-02-16 LAB — SARS CORONAVIRUS 2 (TAT 6-24 HRS): SARS Coronavirus 2: NEGATIVE

## 2020-02-16 LAB — GLUCOSE, CAPILLARY: Glucose-Capillary: 117 mg/dL — ABNORMAL HIGH (ref 70–99)

## 2020-02-16 MED ORDER — ALPRAZOLAM 0.25 MG PO TABS: 0.2500 mg | ORAL_TABLET | Freq: Two times a day (BID) | ORAL | 0 refills | Status: DC | PRN

## 2020-02-16 MED ORDER — ERTAPENEM IV (FOR PTA / DISCHARGE USE ONLY): 500.0000 mg | INTRAVENOUS | 0 refills | Status: AC

## 2020-02-16 MED ORDER — FERROUS SULFATE 325 (65 FE) MG PO TABS: 325.0000 mg | ORAL_TABLET | Freq: Two times a day (BID) | ORAL | 3 refills | Status: DC

## 2020-02-16 NOTE — Progress Notes (Signed)
RN provided update regarding discharge to rehab to patients son. No further questions at this time.

## 2020-02-16 NOTE — Discharge Summary (Addendum)
Physician Discharge Summary  [name] [id number] DOB: 1927-08-04 DOA: 02/10/2020  PCP: [name], NP  Admit date: 02/10/2020 Discharge date: 02/16/2020  Time spent: 50* minutes  Recommendations for Outpatient Follow-up:  1. Check BMP in 3 days  Discharge Diagnoses:  Active Problems:   Cystitis   Discharge Condition: Stable  Diet recommendation: Heart healthy diet  There were no vitals filed for this visit.  History of present illness:  86 year old female with history of atrial fibrillation, not on anticoagulation (patient refused to be on anticoagulation), aortic stenosis, iron deficiency anemia, hypertension, hyperlipidemia, hypothyroidism, rheumatoid arthritis, colovesical fistula came to hospital with acute on chronic worsening abdominal pain.  Patient has been followed by urology as outpatient.  She has refused surgery.  Patient reported that she has been dealing with a low abdominal pain due to fistula for many years but the pain has become more intense over the past 3 weeks.  Also reported constant burning with urination.   Hospital Course:   1. Acute cystitis/chronic colovesical fistula/chronic left urinary tract stricture-CT of the abdomen pelvis revealed previously noted fistula, impression of underlying bladder neoplasm cannot be excluded.  Bladder wall does show thickening with gas.  Also showed chronic hydronephrosis.  Patient declined procedural evaluation.  Patient was started on cefepime and Flagyl.  Urine culture grew ESBL E. coli.  Switch to meropenem.  Will order PICC line again, patient will be discharged to skilled nursing facility on ertapenem for total of 10 days of therapy.    Stop date 02/23/2020.  Urology has nothing new to offer.  Patient does not want surgery. 2. Pulmonary edema-patient developed mild pulmonary edema due to fluid overload.  IV fluids have been discontinued.  Patient is currently on 2 L/min of oxygen.  Consider weaning off  oxygen over next few days. 3. Permanent atrial fibrillation with RVR-patient has declined anticoagulation in the past. 4. Severe aortic stenosis-patient has seen cardiology in the past at was felt to be cirrhotic stenosis.  Patient will follow up with cardiology as outpatient for discussion for TAVR. 5. Normocytic anemia-patient's hemoglobin is 7.8 today, she presented with hemoglobin of 8.8, likely anemia of chronic disease and iron deficiency anemia..  Her previous hemoglobin was 11.0 on 01/25/2020.  Iron is 25, TIBC 302, saturation 8%, folate 21.8, vitamin B12 1,388.  We will start her on ferrous sulfate 325 mg p.o. twice daily.   6. Acute kidney injury on CKD stage III-resolved, patient baseline creatinine is around 1.3, creatinine went up to 1.8.  Today creatinine is down to 1.08.  Likely hypotension induced ischemic injury, ATN.  7. Metabolic acidosis-bicarb is 15, likely hyperchloremic metabolic acidosis. Patient was started on Ringer lactate which has been discontinued.     She was started on sodium bicarb tablets.  She will need to repeat BMP in 3 days. 8. Stable 6.2 cm left adnexal mass-stable complex lobulated 6.2 cm cystic left adnexal mass, indeterminate for cystic ovarian neoplasm.  Patient will need pelvic ultrasound as outpatient.  Discussed with patient, she does not want further evaluation for left adnexal mass.   9. Hypothyroidism-continue levothyroxine 10. Peripheral neuropathy-continue gabapentin   Procedures:    Consultations:  Urology  Discharge Exam: Vitals:   02/15/20 2029 02/16/20 0545  BP: 100/79 99/60  Pulse: 72 94  Resp: 17 16  Temp: 98.4 F (36.9 C) 98.3 F (36.8 C)  SpO2: 100% 100%    General: Appears in no acute distress Cardiovascular: S1-S2, regular Respiratory: Clear to auscultation bilaterally  Discharge Instructions   Discharge Instructions    Diet - low sodium heart healthy   Complete by: As directed    Home infusion instructions    Complete by: As directed    Instructions: Flushing of vascular access device: 0.9% NaCl pre/post medication administration and prn patency; Heparin 100 u/ml, 110ml for implanted ports and Heparin 10u/ml, 64ml for all other central venous catheters.   Increase activity slowly   Complete by: As directed      Allergies as of 02/16/2020      Reactions   Infliximab Other (See Comments)   REACTION: "Enlarged intestines and hernia. Also pushed intestines on lower part of lungs." REACTION: "Enlarged intestines and hernia. Also pushed intestines on lower part of lungs."   Aspirin Nausea And Vomiting   Low Dose is ok Stomach problems   Carvedilol Other (See Comments)   Fatigue/ ill Fatigue/ ill   Codeine Nausea And Vomiting   "Deathly Sick"   Penicillins Rash   Has patient had a PCN reaction causing immediate rash, facial/tongue/throat swelling, SOB or lightheadedness with hypotension: Y Has patient had a PCN reaction causing severe rash involving mucus membranes or skin necrosis: Y Has patient had a PCN reaction that required hospitalization: N Has patient had a PCN reaction occurring within the last 10 years: N If all of the above answers are "NO", then may proceed with Cephalosporin use.      Medication List    STOP taking these medications   furosemide 20 MG tablet Commonly known as: LASIX   irbesartan 75 MG tablet Commonly known as: AVAPRO     TAKE these medications   acetaminophen 500 MG tablet Commonly known as: TYLENOL Take 1,000 mg by mouth every 8 (eight) hours as needed (joint pain).   ALPRAZolam 0.25 MG tablet Commonly known as: XANAX Take 1 tablet (0.25 mg total) by mouth 2 (two) times daily as needed for anxiety (restless legs). What changed: See the new instructions.   aspirin 81 MG chewable tablet Chew 1 tablet (81 mg total) by mouth daily.   ertapenem  IVPB Commonly known as: INVANZ Inject 500 mg into the vein daily for 9 days. Indication: Complicated ESBL E.  Coli UTI Last Day of Therapy: 02/23/2020 Labs - Once weekly:  CBC/D and BMP   ferrous sulfate 325 (65 FE) MG tablet Take 1 tablet (325 mg total) by mouth 2 (two) times daily with a meal.   gabapentin 100 MG capsule Commonly known as: NEURONTIN Take 1 capsule (100 mg total) by mouth at bedtime.   levothyroxine 75 MCG tablet Commonly known as: SYNTHROID TAKE 1 TABLET BY MOUTH ONCE DAILY BEFORE BREAKFAST What changed: See the new instructions.   metoprolol succinate 25 MG 24 hr tablet Commonly known as: TOPROL-XL Take 3 tablets (75 mg total) by mouth daily.   multivitamin with minerals Tabs tablet Take 1 tablet by mouth daily.   Olopatadine HCl 0.2 % Soln Place 1 drop into both eyes daily.            Home Infusion Instuctions  (From admission, onward)         Start     Ordered   02/16/20 0000  Home infusion instructions    Question:  Instructions  Answer:  Flushing of vascular access device: 0.9% NaCl pre/post medication administration and prn patency; Heparin 100 u/ml, 61ml for implanted ports and Heparin 10u/ml, 51ml for all other central venous catheters.   02/16/20 1107  Allergies  Allergen Reactions  . Infliximab Other (See Comments)    REACTION: "Enlarged intestines and hernia. Also pushed intestines on lower part of lungs." REACTION: "Enlarged intestines and hernia. Also pushed intestines on lower part of lungs."  . Aspirin Nausea And Vomiting    Low Dose is ok Stomach problems  . Carvedilol Other (See Comments)    Fatigue/ ill Fatigue/ ill  . Codeine Nausea And Vomiting    "Deathly Sick"  . Penicillins Rash    Has patient had a PCN reaction causing immediate rash, facial/tongue/throat swelling, SOB or lightheadedness with hypotension: Y Has patient had a PCN reaction causing severe rash involving mucus membranes or skin necrosis: Y Has patient had a PCN reaction that required hospitalization: N Has patient had a PCN reaction occurring within the  last 10 years: N If all of the above answers are "NO", then may proceed with Cephalosporin use.    Contact information for after-discharge care    Destination    [facility] Preferred SNF .   Service: Skilled Nursing Contact information: [address] [place] [phone]               The results of significant diagnostics from this hospitalization (including imaging, microbiology, ancillary and laboratory) are listed below for reference.    Significant Diagnostic Studies: CT ABDOMEN PELVIS W CONTRAST  Result Date: 02/10/2020 CLINICAL DATA:  Generalized lower abdominal pain intermittently for 3 years. History of UTI. EXAM: CT ABDOMEN AND PELVIS WITH CONTRAST TECHNIQUE: Multidetector CT imaging of the abdomen and pelvis was performed using the standard protocol following bolus administration of intravenous contrast. CONTRAST:  24mL OMNIPAQUE IOHEXOL 300 MG/ML  SOLN COMPARISON:  10/13/2018 CT abdomen/pelvis. FINDINGS: Lower chest: No significant pulmonary nodules or acute consolidative airspace disease. Right coronary atherosclerosis. Hepatobiliary: Normal liver size. No liver mass. Normal gallbladder with no radiopaque cholelithiasis. No biliary ductal dilatation. Pancreas: Normal, with no mass or duct dilation. Spleen: Normal size spleen. Low-attenuation subcentimeter inferior splenic lesion is unchanged and too small to characterize, considered benign. No new splenic lesions. Adrenals/Urinary Tract: Normal adrenals. Chronic moderate left hydroureteronephrosis to the level of the upper left pelvic ureter with worsening urothelial wall thickening and hyperenhancement in the dilated portion of the upper left urinary tract. Asymmetrically delayed left contrast nephrogram. No right hydronephrosis. Subcentimeter hypodense posterior interpolar right renal cortical lesion is too small to characterize and is unchanged, considered benign. No additional  renal lesions. Diffuse bladder wall thickening with gas in the nondependent bladder lumen. There is loss of the normal fat plane between the sigmoid colon and left superior bladder wall with intervening gas and soft tissue most compatible with a chronic colovesical fistula, unchanged from prior CT (series 4/image 53). Stomach/Bowel: Large hiatal hernia. Partially visualized stomach is nondistended and otherwise normal. Normal caliber small bowel with no small bowel wall thickening. Appendectomy. Oral contrast transits to the rectum. Moderate diffuse colonic diverticulosis. There is marked wall thickening and mucosal hyperenhancement throughout sigmoid colon with some associated pericolonic fat stranding, not substantially changed from 05/26/2019 CT. Evidence of chronic colovesical fistula between sigmoid colon and left superior bladder wall as detailed above. No discrete drainable pericolonic abscess on today's scan. Vascular/Lymphatic: Atherosclerotic nonaneurysmal abdominal aorta. Patent portal, splenic and renal veins. No pathologically enlarged lymph nodes in the abdomen or pelvis. Reproductive: Complex lobulated 6.2 x 4.7 cm cystic left adnexal mass with mixed internal cystic and soft tissue density (series 2/image 50), previously 6.3 x 5.2  cm, not substantially changed. No right adnexal mass. Unremarkable uterus. Other: No ascites. Musculoskeletal: No aggressive appearing focal osseous lesions. Moderate thoracolumbar spondylosis. IMPRESSION: 1. Chronic severe diverticular disease in the sigmoid colon with chronic colovesical fistula. Underlying bladder neoplasm cannot be excluded by imaging, however is less favored given the chronicity of this process. No discrete drainable pericolonic abscess on today's scan. No bowel obstruction. 2. Chronic left urinary tract stricture at the level of the upper left pelvic ureter with moderate left hydroureteronephrosis, presumably due to extrinsic involvement by the chronic  inflammatory diverticular process in the sigmoid colon. Worsening urothelial wall thickening and hyperenhancement in the dilated upper left urinary tract, suggesting ascending left urinary tract infection. 3. Diffuse bladder wall thickening with bladder gas, suggesting active cystitis in this patient with colovesical fistula. 4. Stable complex lobulated 6.2 cm cystic left adnexal mass, indeterminate for cystic ovarian neoplasm. 5. Large hiatal hernia. 6. Coronary atherosclerosis. 7. Aortic Atherosclerosis (ICD10-I70.0). Electronically Signed   By: Ilona Sorrel M.D.   On: 02/10/2020 10:31   DG CHEST PORT 1 VIEW  Result Date: 02/15/2020 CLINICAL DATA:  Wheezing. EXAM: PORTABLE CHEST 1 VIEW COMPARISON:  1 day prior FINDINGS: Patient rotated right. Mild cardiomegaly. Hiatal hernia. Possible tiny right pleural effusion. No pneumothorax. Right PICC line tip at caval atrial junction. Suspect developing mild pulmonary venous congestion. Persistent bibasilar atelectasis. Numerous leads and wires project over the chest. IMPRESSION: 1. Cardiomegaly with suspicion of developing mild pulmonary venous congestion. Possible layering right pleural effusion. 2. Similar bibasilar atelectasis. 3. Hiatal hernia. Electronically Signed   By: Abigail Miyamoto M.D.   On: 02/15/2020 08:07   DG CHEST PORT 1 VIEW  Result Date: 02/14/2020 CLINICAL DATA:  PICC placement EXAM: PORTABLE CHEST 1 VIEW COMPARISON:  02/10/2020 chest radiograph. FINDINGS: Right PICC terminates over the cavoatrial junction. Stable cardiomediastinal silhouette with mild cardiomegaly and moderate hiatal hernia. No pneumothorax. No pleural effusion. No overt pulmonary edema. Mild medial bibasilar atelectasis. IMPRESSION: 1. Right PICC terminates over the cavoatrial junction. 2. Stable mild cardiomegaly without overt pulmonary edema. 3. Mild medial bibasilar atelectasis. Moderate hiatal hernia. Electronically Signed   By: Ilona Sorrel M.D.   On: 02/14/2020 17:13   DG  Chest Port 1 View  Result Date: 02/10/2020 CLINICAL DATA:  Shortness of breath this morning EXAM: PORTABLE CHEST 1 VIEW COMPARISON:  09/12/2019 FINDINGS: Chronic enlarged cardiopericardial size accentuated by a large hiatal hernia. There is no edema, consolidation, effusion, or pneumothorax. Lung volumes are low IMPRESSION: 1. Cardiomegaly without failure. 2. Hiatal hernia. Electronically Signed   By: Monte Fantasia M.D.   On: 02/10/2020 06:43   [name] EKG SITE RITE  Result Date: 02/13/2020 If Site Rite image not attached, placement could not be confirmed due to current cardiac rhythm.   Microbiology: Recent Results (from the past 240 hour(s))  SARS CORONAVIRUS 2 (TAT 6-24 HRS) Nasopharyngeal     Status: None   Collection Time: 02/10/20  6:29 AM   Specimen: Nasopharyngeal  Result Value Ref Range Status   SARS Coronavirus 2 NEGATIVE NEGATIVE Final    Comment: (NOTE) SARS-CoV-2 target nucleic acids are NOT DETECTED. The SARS-CoV-2 RNA is generally detectable in upper and lower respiratory specimens during the acute phase of infection. Negative results do not preclude SARS-CoV-2 infection, do not rule out co-infections with other pathogens, and should not be used as the sole basis for treatment or other patient management decisions. Negative results must be combined with clinical observations, patient history, and epidemiological information. The expected  result is Negative. Fact Sheet for Patients: SugarRoll.be Fact Sheet for Healthcare Providers: https://www.woods-mathews.com/ This test is not yet approved or cleared by the [place] FDA and  has been authorized for detection and/or diagnosis of SARS-CoV-2 by FDA under an Emergency Use Authorization (EUA). This EUA will remain  in effect (meaning this test can be used) for the duration of the COVID-19 declaration under Section 56 4(b)(1) of the Act, 21 U.S.C. section 360bbb-3(b)(1), unless  the authorization is terminated or revoked sooner. Performed at [facility], [place] [address] [place], [place] [address]   Urine culture     Status: Abnormal   Collection Time: 02/10/20  7:10 AM   Specimen: Urine, Catheterized  Result Value Ref Range Status   Specimen Description   Final    URINE, CATHETERIZED Performed at [facility] [address], [place], [place] [address]    Special Requests   Final    NONE Performed at [facility], [place] [address], [place], [place] [address]    Culture (A)  Final    >=100,000 COLONIES/mL ESCHERICHIA COLI Confirmed Extended Spectrum Beta-Lactamase Producer (ESBL).  In bloodstream infections from ESBL organisms, carbapenems are preferred over piperacillin/tazobactam. They are shown to have a lower risk of mortality.    Report Status 02/13/2020 FINAL  Final   Organism ID, Bacteria ESCHERICHIA COLI (A)  Final      Susceptibility   Escherichia coli - MIC*    AMPICILLIN >=32 RESISTANT Resistant     CEFAZOLIN >=64 RESISTANT Resistant     CEFTRIAXONE >=64 RESISTANT Resistant     CIPROFLOXACIN >=4 RESISTANT Resistant     GENTAMICIN <=1 SENSITIVE Sensitive     IMIPENEM <=0.25 SENSITIVE Sensitive     NITROFURANTOIN 64 INTERMEDIATE Intermediate     TRIMETH/SULFA <=20 SENSITIVE Sensitive     AMPICILLIN/SULBACTAM 8 SENSITIVE Sensitive     PIP/TAZO <=4 SENSITIVE Sensitive     * >=100,000 COLONIES/mL ESCHERICHIA COLI  C Difficile Quick Screen (NO PCR Reflex)     Status: None   Collection Time: 02/14/20  6:00 AM   Specimen: STOOL  Result Value Ref Range Status   C Diff antigen NEGATIVE NEGATIVE Final   C Diff toxin NEGATIVE NEGATIVE Final   C Diff interpretation NEGATIVE  Corrected    Comment: VALID Performed at [facility] [address] [place], [place] [address] CORRECTED ON 04/17 AT [id number]: PREVIOUSLY REPORTED AS VALID   SARS CORONAVIRUS 2 (TAT 6-24 HRS) Nasopharyngeal  Nasopharyngeal Swab     Status: None   Collection Time: 02/14/20  8:37 AM   Specimen: Nasopharyngeal Swab  Result Value Ref Range Status   SARS Coronavirus 2 NEGATIVE NEGATIVE Final    Comment: (NOTE) SARS-CoV-2 target nucleic acids are NOT DETECTED. The SARS-CoV-2 RNA is generally detectable in upper and lower respiratory specimens during the acute phase of infection. Negative results do not preclude SARS-CoV-2 infection, do not rule out co-infections with other pathogens, and should not be used as the sole basis for treatment or other patient management decisions. Negative results must be combined with clinical observations, patient history, and epidemiological information. The expected result is Negative. Fact Sheet for Patients: SugarRoll.be Fact Sheet for Healthcare Providers: https://www.woods-mathews.com/ This test is not yet approved or cleared by the [place] FDA and  has been authorized for detection and/or diagnosis of SARS-CoV-2 by FDA under an Emergency Use Authorization (EUA). This EUA will remain  in effect (meaning this test can  be used) for the duration of the COVID-19 declaration under Section 56 4(b)(1) of the Act, 21 U.S.C. section 360bbb-3(b)(1), unless the authorization is terminated or revoked sooner. Performed at [facility], [place] [address], [place], [place] [address]      Labs: Basic Metabolic Panel: Recent Labs  Lab 02/10/20 0026 02/10/20 0026 02/11/20 0549 02/11/20 0549 02/12/20 0603 02/13/20 0537 02/14/20 0615 02/15/20 0846 02/16/20 0156  NA 138  --  136  --  139 138 140  --   --   K 4.3  --  5.0  --  4.6 4.4 3.9  --   --   CL 111  --  112*  --  114* 115* 117*  --   --   CO2 18*  --  17*  --  17* 17* 15*  --   --   GLUCOSE 105*  --  108*  --  154* 134* 102*  --   --   BUN 26*  --  28*  --  34* 29* 25*  --   --   CREATININE 1.39*   < > 1.67*   < > 1.81* 1.57* 1.54* 1.18* 1.08*  CALCIUM  8.9  --  8.3*  --  8.4* 8.4* 8.3*  --   --    < > = values in this interval not displayed.   Liver Function Tests: Recent Labs  Lab 02/10/20 0026  AST 24  ALT 15  ALKPHOS 101  BILITOT 0.6  PROT 7.8  ALBUMIN 3.6   Recent Labs  Lab 02/10/20 0026  LIPASE 98*   No results for input(s): AMMONIA in the last 168 hours. CBC: Recent Labs  Lab 02/10/20 0026 02/11/20 0549 02/12/20 0603 02/13/20 0537 02/14/20 0615  WBC 10.6* 10.1 10.8* 16.8* 12.2*  HGB 8.8* 7.3* 7.5* 8.1* 7.8*  HCT 28.4* 24.4* 25.2* 27.0* 26.2*  MCV 89.6 92.1 92.6 92.8 92.6  PLT 423* 308 309 344 359   Cardiac Enzymes: No results for input(s): CKTOTAL, CKMB, CKMBINDEX, TROPONINI in the last 168 hours. BNP: BNP (last 3 results) Recent Labs    09/02/19 1727  BNP 1,352.1*     CBG: Recent Labs  Lab 02/12/20 0730 02/13/20 0755 02/14/20 1002 02/15/20 0802 02/16/20 0802  GLUCAP 141* 131* 134* 100* 117*       Signed:  Oswald Hillock MD.  [facility] 02/16/2020, 11:12 AM

## 2020-02-16 NOTE — Care Management Important Message (Signed)
Important Message  Patient Details IM Letter given to Nancy Marus RN Case Manager to present to the Patient Name: [name] MRN: [id number] Date of Birth: May 16, 1927   Medicare Important Message Given:  Yes     Kerin Salen 02/16/2020, 10:26 AM

## 2020-02-16 NOTE — Progress Notes (Signed)
Patient is being discharged to [facility]. [facility] transportation arranged per SW. Discharge instructions printed and placed in packet for transport.   RN called and spoke with Lenward Chancellor at [facility] to provide report for patient. No further questions at this time.

## 2020-02-16 NOTE — TOC Progression Note (Signed)
Transition of Care [facility]) - Progression Note    Patient Details  Name: [name] MRN: [id number] Date of Birth: 27-Mar-1927  Transition [facility]) CM/SW Contact  Joaquin Courts, RN Phone Number: 02/16/2020, 12:03 PM  Clinical Narrative:   CM confirmed bed availability at [facility].  Patient assigned to bed 501.  Discharge summary faxed to facility and [facility] transport arranged.     Expected Discharge Plan: [place] Barriers to Discharge: No Barriers Identified  Expected Discharge Plan and Services Expected Discharge Plan: [place] In-house Referral: Clinical Social Work Discharge Planning Services: CM Consult Post Acute Care Choice: [place] Living arrangements for the past 2 months: [place] Expected Discharge Date: 02/16/20                         HH Arranged: RN, PT, IV Antibiotics HH Agency: [place] ([place]) Date [place]: 02/13/20   Representative spoke with at [place]: [place] ([place]) Interventions    Readmission Risk Interventions Readmission Risk Prevention Plan 09/03/2019  Transportation Screening Complete  PCP or Specialist Appt within 3-5 Days Complete  HRI or [place] Complete  Social Work Consult for [place] Planning/Counseling Complete  Palliative Care Screening Not Applicable  Medication Review [employer]) Complete  Some recent data might be hidden

## 2020-02-17 ENCOUNTER — Encounter: Payer: Self-pay | Admitting: Internal Medicine

## 2020-02-17 ENCOUNTER — Non-Acute Institutional Stay (SKILLED_NURSING_FACILITY): Payer: Medicare Other | Admitting: Internal Medicine

## 2020-02-17 DIAGNOSIS — N189 Chronic kidney disease, unspecified: Secondary | ICD-10-CM | POA: Diagnosis not present

## 2020-02-17 DIAGNOSIS — D508 Other iron deficiency anemias: Secondary | ICD-10-CM | POA: Diagnosis not present

## 2020-02-17 DIAGNOSIS — I482 Chronic atrial fibrillation, unspecified: Secondary | ICD-10-CM

## 2020-02-17 DIAGNOSIS — I5041 Acute combined systolic (congestive) and diastolic (congestive) heart failure: Secondary | ICD-10-CM | POA: Diagnosis not present

## 2020-02-17 DIAGNOSIS — N131 Hydronephrosis with ureteral stricture, not elsewhere classified: Secondary | ICD-10-CM

## 2020-02-17 DIAGNOSIS — G629 Polyneuropathy, unspecified: Secondary | ICD-10-CM | POA: Diagnosis not present

## 2020-02-17 DIAGNOSIS — N179 Acute kidney failure, unspecified: Secondary | ICD-10-CM

## 2020-02-17 DIAGNOSIS — E872 Acidosis, unspecified: Secondary | ICD-10-CM

## 2020-02-17 DIAGNOSIS — N3090 Cystitis, unspecified without hematuria: Secondary | ICD-10-CM | POA: Diagnosis not present

## 2020-02-17 DIAGNOSIS — E039 Hypothyroidism, unspecified: Secondary | ICD-10-CM

## 2020-02-17 DIAGNOSIS — N838 Other noninflammatory disorders of ovary, fallopian tube and broad ligament: Secondary | ICD-10-CM | POA: Diagnosis not present

## 2020-02-17 DIAGNOSIS — N321 Vesicointestinal fistula: Secondary | ICD-10-CM

## 2020-02-17 NOTE — Progress Notes (Signed)
: Provider:  Hennie Duos., MD Location:  [facility] Room Number: [id number] Place of Service:  SNF ([phone])  PCP: [name], NP Patient Care Team: Nche, Charlene Brooke, NP as PCP - General (Internal Medicine) O'Neal, Cassie Freer, MD as PCP - Cardiology (Internal Medicine) Gavin Pound, MD as Consulting Physician (Rheumatology) Tommy Medal, Lavell Islam, MD as Consulting Physician (Infectious Diseases) Bo Merino, MD as Consulting Physician (Rheumatology)  Extended Emergency Contact Information Primary Emergency Contact: [place] of [place] Phone: [phone] Relation: Son Secondary Emergency Contact: Teodora Medici [address] [address] [place]itter of [place] Phone: [phone] Mobile Phone: [phone] Relation: Son     Allergies: Infliximab, Aspirin, Carvedilol, Codeine, and Penicillins  Chief Complaint  Patient presents with  . New Admit To SNF    New admission to [facility] SNF    HPI: Patient is a 86 y.o. female with atrial fibrillation, not on anticoagulation secondary to patient wishes, aortic stenosis, iron deficiency anemia, hypertension, hyperlipidemia, hypothyroidism, RA, and colovesicular fistula who presented to [facility] long hospital with acute on chronic worsening abdominal pain.  Patient has been followed by urology as an outpatient but has refused surgery.  Patient reported she been dealing with low abdominal pain due to fistula for many years but the pain had become more intense over the prior 3 weeks.  Patient also reported constant burning with urination.  Patient is admitted to hospital from 4/13-19 where patient was treated for acute cystitis with CT showing bladder wall thickening with gas and chronic hydronephrosis.  Patient was started on cefepime and Flagyl, urine culture grew out E. coli and patient was switched to meropenem.  Patient also developed some mild pulmonary edema  due to fluid overload which resolved with discontinuation of IV fluids. patient did require oxygen.  Hospital course was also complicated by acute on chronic kidney disease stage III R high creatinine of 1.8 which resolved with IV fluid as well as metabolic acidosis and patient was started on sodium bicarb tablets.  Patient was also found to have a left adnexal mass which had been seen before which was suspicious for cancer patient does not want further evaluation.  Patient is admitted to skilled nursing facility for OT/PT.  While at skilled nursing facility patient will be followed for hypothyroidism treated with replacement, peripheral neuropathy treated with gabapentin and atrial fibrillation treated with Toprol and ASA.  Past Medical History:  Diagnosis Date  . A-fib ([place])   . Aortic stenosis, moderate    last echo in 2010; AVA 1.1; mild AS per echo in June 2013  . Edema of both legs    s/p laser treatment per Dr. Donnetta Hutching  . Hiatal hernia   . History of chicken pox   . HLD (hyperlipidemia)   . HTN (hypertension)   . Hypothyroidism   . IBS (irritable bowel syndrome)   . Iron deficiency anemia   . Neuropathic pain 09/06/2015  . Rheumatoid arthritis [facility])     Past Surgical History:  Procedure Laterality Date  . APPENDECTOMY  1955  . CARPAL TUNNEL RELEASE Right   . CATARACT EXTRACTION    . ENDOSCOPIC VEIN LASER TREATMENT    . ENDOVENOUS ABLATION SAPHENOUS VEIN W/ LASER  08-29-2012   left greater saphenous vein   Curt Jews MD  . ENDOVENOUS ABLATION SAPHENOUS VEIN W/ LASER  09-19-2012   right greater saphenous vein by Curt Jews MD  .  EYE SURGERY  2005   Bilateral cataract  . FOOT SURGERY  2003   bilateral Hammer Toe  . stab phlebectomy Right 01-02-2013   10-15 incisions right thigh and calf by Curt Jews MD    Allergies as of 02/17/2020      Reactions   Infliximab Other (See Comments)   REACTION: "Enlarged intestines and hernia. Also pushed intestines on lower part of  lungs." REACTION: "Enlarged intestines and hernia. Also pushed intestines on lower part of lungs."   Aspirin Nausea And Vomiting   Low Dose is ok Stomach problems   Carvedilol Other (See Comments)   Fatigue/ ill Fatigue/ ill   Codeine Nausea And Vomiting   "Deathly Sick"   Penicillins Rash   Has patient had a PCN reaction causing immediate rash, facial/tongue/throat swelling, SOB or lightheadedness with hypotension: Y Has patient had a PCN reaction causing severe rash involving mucus membranes or skin necrosis: Y Has patient had a PCN reaction that required hospitalization: N Has patient had a PCN reaction occurring within the last 10 years: N If all of the above answers are "NO", then may proceed with Cephalosporin use.      Medication List       Accurate as of February 17, 2020 11:14 AM. If you have any questions, ask your nurse or doctor.        acetaminophen 500 MG tablet Commonly known as: TYLENOL Take 1,000 mg by mouth every 8 (eight) hours as needed (joint pain).   ALPRAZolam 0.25 MG tablet Commonly known as: XANAX Take 1 tablet (0.25 mg total) by mouth 2 (two) times daily as needed for anxiety (restless legs).   aspirin 81 MG chewable tablet Chew 1 tablet (81 mg total) by mouth daily.   ertapenem  IVPB Commonly known as: INVANZ Inject 500 mg into the vein daily for 9 days. Indication: Complicated ESBL E. Coli UTI Last Day of Therapy: 02/23/2020 Labs - Once weekly:  CBC/D and BMP   ferrous sulfate 325 (65 FE) MG tablet Take 325 mg by mouth. With a meal What changed: Another medication with the same name was removed. Continue taking this medication, and follow the directions you see here. Changed by: Inocencio Homes, MD   gabapentin 100 MG capsule Commonly known as: NEURONTIN Take 1 capsule (100 mg total) by mouth at bedtime.   levothyroxine 75 MCG tablet Commonly known as: SYNTHROID TAKE 1 TABLET BY MOUTH ONCE DAILY BEFORE BREAKFAST   metoprolol succinate 25  MG 24 hr tablet Commonly known as: TOPROL-XL Take 3 tablets (75 mg total) by mouth daily.   multivitamin with minerals Tabs tablet Take 1 tablet by mouth daily.   Olopatadine HCl 0.2 % Soln Place 1 drop into both eyes daily.       No orders of the defined types were placed in this encounter.   Immunization History  Administered Date(s) Administered  . Influenza, High Dose Seasonal PF 09/17/2017, 08/09/2018  . Influenza,inj,Quad PF,6+ Mos 09/06/2015  . Influenza,trivalent, recombinat, inj, PF 08/01/2011, 06/21/2012  . Influenza-Unspecified 08/01/2011, 06/21/2012, 09/06/2015, 07/31/2019  . PPD Test 12/04/2011  . Pneumococcal Conjugate-13 08/09/2018  . Pneumococcal Polysaccharide-23 07/04/2011  . Tdap 07/04/2011, 08/17/2014    Social History   Tobacco Use  . Smoking status: Former Smoker    Types: Cigarettes    Quit date: 10/31/1983    Years since quitting: 36.3  . Smokeless tobacco: Never Used  Substance Use Topics  . Alcohol use: No    Family history is  Family History  Problem Relation Age of Onset  . Heart disease Mother   . Peripheral vascular disease Mother   . Heart disease Father   . Peripheral vascular disease Father        Right leg amputation  . COPD Father   . Heart disease Brother 55       Heart Disease before age 45  . Heart attack Brother   . Peripheral vascular disease Other   . Hypertension Sister       Review of Systems   GENERAL:  no fevers, fatigue, appetite changes SKIN: No itching, or rash EYES: No eye pain, redness, discharge EARS: No earache, tinnitus, change in hearing NOSE: No congestion, drainage or bleeding  MOUTH/THROAT: No mouth or tooth pain, No sore throat RESPIRATORY: No cough, wheezing, SOB CARDIAC: No chest pain, palpitations, lower extremity edema  GI: No abdominal pain, No N/V/D or constipation, No heartburn or reflux  GU: No dysuria, frequency or urgency, or incontinence  MUSCULOSKELETAL: No unrelieved bone/joint  pain NEUROLOGIC: No headache, dizziness or focal weakness PSYCHIATRIC: No c/o anxiety or sadness   Vitals:   02/17/20 1107  BP: 131/74  Pulse: 83  Resp: 16  Temp: 97.7 F (36.5 C)  SpO2: 100%    SpO2 Readings from Last 1 Encounters:  02/17/20 100%   Body mass index is 22.65 kg/m.     Physical Exam  GENERAL APPEARANCE: Alert, conversant,  No acute distress.  SKIN: No diaphoresis rash HEAD: Normocephalic, atraumatic  EYES: Conjunctiva/lids clear. Pupils round, reactive. EOMs intact.  EARS: External exam WNL, canals clear. Hearing grossly normal.  NOSE: No deformity or discharge.  MOUTH/THROAT: Lips w/o lesions  RESPIRATORY: Breathing is even, unlabored. Lung sounds are clear   CARDIOVASCULAR: Heart irregular with 3/6 murmur, no rubs or gallops.  1+ peripheral edema.   GASTROINTESTINAL: Abdomen is soft, non-tender, not distended w/ normal bowel sounds. GENITOURINARY: Bladder non tender, not distended  MUSCULOSKELETAL: No abnormal joints or musculature NEUROLOGIC:  Cranial nerves 2-12 grossly intact. Moves all extremities  PSYCHIATRIC: Mood and affect appropriate to situation, no behavioral issues  Patient Active Problem List   Diagnosis Date Noted  . Cystitis 02/10/2020  . Aortic atherosclerosis ([place]) 09/16/2019  . Acute CHF (congestive heart failure) ([place]) 09/02/2019  . UTI (urinary tract infection) 08/21/2019  . Colovesical fistula 05/28/2019  . Polyneuropathy 11/06/2018  . Goals of care, counseling/discussion   . Hydronephrosis 10/13/2018  . Ovarian mass 10/13/2018  . C. difficile colitis 10/13/2018  . Clostridium difficile colitis 09/24/2018  . Hypokalemia   . Hypomagnesemia   . Left ovarian cyst   . Acute diverticulitis 09/07/2018  . Leucocytosis 09/06/2018  . A-fib ([place]) 09/06/2018  . Malnutrition of moderate degree 07/26/2018  . Perforation of sigmoid colon due to diverticulitis 07/25/2018  . Thrombocytosis ([place]) 07/25/2018  . Sepsis ([place]) 07/24/2018   . Generalized abdominal pain 02/06/2018  . Paresthesia of both lower extremities 02/06/2018  . Chronic kidney disease (CKD), stage III (moderate) 01/07/2018  . Normocytic normochromic anemia 01/07/2018  . Hyperglycemia 01/07/2018  . Iron deficiency 09/19/2017  . Pyoderma gangrenosum 01/04/2017  . Primary osteoarthritis of both hands 01/04/2017  . Primary osteoarthritis of both feet 01/04/2017  . Primary osteoarthritis of both knees 01/04/2017  . Degenerative joint disease involving multiple joints 01/04/2017  . Neuropathic pain 09/06/2015  . Erythema nodosum 09/21/2014  . Rheumatoid arthritis ([place]) 09/21/2014  . Essential hypertension 09/02/2014  . Rectal bleeding 05/15/2014  . Atrial fibrillation, chronic 05/11/2014  .  HLD (hyperlipidemia) 04/02/2014  . Hypothyroidism 04/02/2014  . PAC (premature atrial contraction) 01/23/2014  . Near syncope 01/22/2014  . Varicose veins of lower extremities with other complications [id number]  . Localized edema 06/21/2012  . Venous insufficiency of both lower extremities 04/19/2012  . Leg edema 01/17/2012  . Anxiety 11/05/2011  . Aortic stenosis 03/22/2011      Labs reviewed: Basic Metabolic Panel:    Component Value Date/Time   NA 140 02/14/2020 0615   NA 141 11/04/2018 0000   K 3.9 02/14/2020 0615   CL 117 (H) 02/14/2020 0615   CO2 15 (L) 02/14/2020 0615   GLUCOSE 102 (H) 02/14/2020 0615   BUN 25 (H) 02/14/2020 0615   BUN 9 11/04/2018 0000   CREATININE 1.08 (H) 02/16/2020 0156   CREATININE 1.38 (H) 09/12/2019 1541   CALCIUM 8.3 (L) 02/14/2020 0615   CALCIUM 8.6 10/09/2018 0000   PROT 7.8 02/10/2020 0026   ALBUMIN 3.6 02/10/2020 0026   AST 24 02/10/2020 0026   ALT 15 02/10/2020 0026   ALKPHOS 101 02/10/2020 0026   BILITOT 0.6 02/10/2020 0026   GFRNONAA 45 (L) 02/16/2020 0156   GFRAA 52 (L) 02/16/2020 0156    Recent Labs    07/03/19 1531 09/04/19 0509 09/05/19 0723 09/05/19 0803 09/12/19 1541 09/12/19 1541  10/14/19 1419 01/25/20 1943 02/12/20 0603 02/12/20 0603 02/13/20 0537 02/13/20 0537 02/14/20 0615 02/15/20 0846 02/16/20 0156  NA   < > 138   < >  --  135   < > 137  137   < > 139  --  138  --  140  --   --   K   < > 4.0   < >  --  4.7   < > 4.8  4.8   < > 4.6  --  4.4  --  3.9  --   --   CL   < > 98   < >  --  94*   < > 100  100   < > 114*  --  115*  --  117*  --   --   CO2   < > 29   < >  --  28   < > 29  29   < > 17*  --  17*  --  15*  --   --   GLUCOSE   < > 104*   < >  --  109*   < > 107*  107*   < > 154*  --  134*  --  102*  --   --   BUN   < > 11   < >  --  34*   < > 33*  33*   < > 34*  --  29*  --  25*  --   --   CREATININE   < > 1.07*   < >  --  1.38*  --  1.38*  1.38*   < > 1.81*   < > 1.57*   < > 1.54* 1.18* 1.08*  CALCIUM   < > 8.3*   < >  --  9.6   < > 9.7  9.7   < > 8.4*  --  8.4*  --  8.3*  --   --   MG  --  2.0  --  1.8 1.9  --   --   --   --   --   --   --   --   --   --  PHOS  --  3.3  --  3.9 4.7*  --  4.7*  4.7*  --   --   --   --   --   --   --   --    < > = values in this interval not displayed.   Liver Function Tests: Recent Labs    09/04/19 0509 09/04/19 0509 09/12/19 1541 10/14/19 1419 01/25/20 1943 02/10/20 0026  AST 19   < > 22  --  24 24  ALT 14   < > 11  --  16 15  ALKPHOS 78  --   --   --  108 101  BILITOT 0.8   < > 0.6  --  0.5 0.6  PROT 6.4*   < > 8.1  --  7.5 7.8  ALBUMIN 2.7*   < >  --  3.9 3.2* 3.6   < > = values in this interval not displayed.   Recent Labs    01/25/20 1943 02/10/20 0026  LIPASE 92* 98*   No results for input(s): AMMONIA in the last 8760 hours. CBC: Recent Labs    09/02/19 1726 09/03/19 0437 09/04/19 0509 09/04/19 0509 09/05/19 0803 09/12/19 1541 02/12/20 0603 02/13/20 0537 02/14/20 0615  WBC 11.4*   < > 7.2   < > 12.2*   < > 10.8* 16.8* 12.2*  NEUTROABS 8.7*  --  3.6  --  8.0*  --   --   --   --   HGB 9.9*   < > 9.6*   < > 10.4*   < > 7.5* 8.1* 7.8*  HCT 33.0*   < > 31.5*   < > 34.8*   < >  25.2* 27.0* 26.2*  MCV 89.4   < > 89.0   < > 89.2   < > 92.6 92.8 92.6  PLT 463*   < > 408*   < > 445*   < > 309 344 359   < > = values in this interval not displayed.   Lipid Recent Labs    07/03/19 1531  CHOL 155  HDL 51  LDLCALC 80  TRIG 143    Cardiac Enzymes: No results for input(s): CKTOTAL, CKMB, CKMBINDEX, TROPONINI in the last 8760 hours. BNP: Recent Labs    09/02/19 1727  BNP 1,352.1*   No results found for: [facility] Lab Results  Component Value Date   HGBA1C 5.8 (H) 07/03/2019   Lab Results  Component Value Date   TSH 1.170 09/05/2019   Lab Results  Component Value Date   VITAMINB12 1,388 (H) 02/11/2020   Lab Results  Component Value Date   FOLATE 21.8 02/11/2020   Lab Results  Component Value Date   IRON 25 (L) 02/11/2020   TIBC 302 02/11/2020   FERRITIN 34 02/11/2020    Imaging and Procedures obtained prior to SNF admission: CT ABDOMEN PELVIS W CONTRAST  Result Date: 02/10/2020 CLINICAL DATA:  Generalized lower abdominal pain intermittently for 3 years. History of UTI. EXAM: CT ABDOMEN AND PELVIS WITH CONTRAST TECHNIQUE: Multidetector CT imaging of the abdomen and pelvis was performed using the standard protocol following bolus administration of intravenous contrast. CONTRAST:  47mL OMNIPAQUE IOHEXOL 300 MG/ML  SOLN COMPARISON:  10/13/2018 CT abdomen/pelvis. FINDINGS: Lower chest: No significant pulmonary nodules or acute consolidative airspace disease. Right coronary atherosclerosis. Hepatobiliary: Normal liver size. No liver mass. Normal gallbladder with no radiopaque cholelithiasis. No biliary ductal dilatation. Pancreas: Normal, with no mass or duct dilation. Spleen:  Normal size spleen. Low-attenuation subcentimeter inferior splenic lesion is unchanged and too small to characterize, considered benign. No new splenic lesions. Adrenals/Urinary Tract: Normal adrenals. Chronic moderate left hydroureteronephrosis to the level of the upper left pelvic  ureter with worsening urothelial wall thickening and hyperenhancement in the dilated portion of the upper left urinary tract. Asymmetrically delayed left contrast nephrogram. No right hydronephrosis. Subcentimeter hypodense posterior interpolar right renal cortical lesion is too small to characterize and is unchanged, considered benign. No additional renal lesions. Diffuse bladder wall thickening with gas in the nondependent bladder lumen. There is loss of the normal fat plane between the sigmoid colon and left superior bladder wall with intervening gas and soft tissue most compatible with a chronic colovesical fistula, unchanged from prior CT (series 4/image 53). Stomach/Bowel: Large hiatal hernia. Partially visualized stomach is nondistended and otherwise normal. Normal caliber small bowel with no small bowel wall thickening. Appendectomy. Oral contrast transits to the rectum. Moderate diffuse colonic diverticulosis. There is marked wall thickening and mucosal hyperenhancement throughout sigmoid colon with some associated pericolonic fat stranding, not substantially changed from 05/26/2019 CT. Evidence of chronic colovesical fistula between sigmoid colon and left superior bladder wall as detailed above. No discrete drainable pericolonic abscess on today's scan. Vascular/Lymphatic: Atherosclerotic nonaneurysmal abdominal aorta. Patent portal, splenic and renal veins. No pathologically enlarged lymph nodes in the abdomen or pelvis. Reproductive: Complex lobulated 6.2 x 4.7 cm cystic left adnexal mass with mixed internal cystic and soft tissue density (series 2/image 50), previously 6.3 x 5.2 cm, not substantially changed. No right adnexal mass. Unremarkable uterus. Other: No ascites. Musculoskeletal: No aggressive appearing focal osseous lesions. Moderate thoracolumbar spondylosis. IMPRESSION: 1. Chronic severe diverticular disease in the sigmoid colon with chronic colovesical fistula. Underlying bladder neoplasm  cannot be excluded by imaging, however is less favored given the chronicity of this process. No discrete drainable pericolonic abscess on today's scan. No bowel obstruction. 2. Chronic left urinary tract stricture at the level of the upper left pelvic ureter with moderate left hydroureteronephrosis, presumably due to extrinsic involvement by the chronic inflammatory diverticular process in the sigmoid colon. Worsening urothelial wall thickening and hyperenhancement in the dilated upper left urinary tract, suggesting ascending left urinary tract infection. 3. Diffuse bladder wall thickening with bladder gas, suggesting active cystitis in this patient with colovesical fistula. 4. Stable complex lobulated 6.2 cm cystic left adnexal mass, indeterminate for cystic ovarian neoplasm. 5. Large hiatal hernia. 6. Coronary atherosclerosis. 7. Aortic Atherosclerosis (ICD10-I70.0). Electronically Signed   By: Ilona Sorrel M.D.   On: 02/10/2020 10:31   DG Chest Port 1 View  Result Date: 02/10/2020 CLINICAL DATA:  Shortness of breath this morning EXAM: PORTABLE CHEST 1 VIEW COMPARISON:  09/12/2019 FINDINGS: Chronic enlarged cardiopericardial size accentuated by a large hiatal hernia. There is no edema, consolidation, effusion, or pneumothorax. Lung volumes are low IMPRESSION: 1. Cardiomegaly without failure. 2. Hiatal hernia. Electronically Signed   By: Monte Fantasia M.D.   On: 02/10/2020 06:43     Not all labs, radiology exams or other studies done during hospitalization come through on my EPIC note; however they are reviewed by me.    Assessment and Plan  Acute cystitis/chronic colovesicular fistula/hydronephrosis left-patient started on cefepime and Flagyl and when urine culture grew out ESBL E. coli we will switch to meropenem.  Patient will be discharged on ertapenem for total of 10 days of therapy with a stop date of 4/26; neurology has nothing new to offer, patient does not want surgery SNF-admitted  for  OT/PT; continue ertapenem 500 mg IV for 9 more days with a stop therapy of 4/26  Pulmonary edema/acute kidney injury on CKD stage III secondary to ATN -due to fluid overload requiring O2; resolved with stopping IV fluids; creatinine reached a high of 1.8, baseline around 1.3 SNF-follow-up BMP in 3 days  Metabolic acidosis-bicarb 15; patient started on LR and then sodium bicarb tablets SNF-continue sodium bicarb 1 g 3 times daily with meals; follow-up BMP 3 days  Normocytic anemia-DC hemoglobin 7.8; patient was started on iron SNF-continue FeSO4 325 mg daily  Left adnexal mass-6.2 cm stable, suspicious for ovarian neoplasm; patient does not want further work-up  Hypothyroidism SNF-not stated as uncontrolled; continue Synthroid 75 mcg daily  Peripheral neuropathy SNF-no status uncontrolled; continue Neurontin 100 mg nightly  Chronic atrial fibrillation SNF-continue Toprol-XL 75 mg daily and ASA 81 mg daily prophylaxis   Time spent greater than 45 minutes;> 50% of time with patient was spent reviewing records, labs, tests and studies, counseling and developing plan of care  Hennie Duos, MD

## 2020-02-18 ENCOUNTER — Other Ambulatory Visit: Payer: Self-pay | Admitting: *Deleted

## 2020-02-18 NOTE — Patient Outreach (Signed)
Screened for potential [facility] Care Management needs as a benefit of  [employer].  Mrs. Zieser is currently receiving skilled therapy at [facility].  Writer attended telephonic interdisciplinary team meeting to assess for disposition needs and transition plan for resident.   Facility reports member is from home with son. Continues with iv antibiotics. Will continue to follow for transition plans and potential [facility] needs.   Marthenia Rolling, MSN-Ed, RN,BSN [place] Acute Care Coordinator [phone] [facility]) [phone]  (Toll free office)

## 2020-02-22 ENCOUNTER — Encounter: Payer: Self-pay | Admitting: Internal Medicine

## 2020-02-22 DIAGNOSIS — E872 Acidosis, unspecified: Secondary | ICD-10-CM | POA: Insufficient documentation

## 2020-02-22 DIAGNOSIS — D509 Iron deficiency anemia, unspecified: Secondary | ICD-10-CM

## 2020-02-22 DIAGNOSIS — N179 Acute kidney failure, unspecified: Secondary | ICD-10-CM | POA: Insufficient documentation

## 2020-02-22 HISTORY — DX: Iron deficiency anemia, unspecified: D50.9

## 2020-02-25 ENCOUNTER — Other Ambulatory Visit: Payer: Self-pay | Admitting: *Deleted

## 2020-02-25 ENCOUNTER — Non-Acute Institutional Stay (SKILLED_NURSING_FACILITY): Payer: Medicare Other | Admitting: Internal Medicine

## 2020-02-25 ENCOUNTER — Encounter: Payer: Self-pay | Admitting: Internal Medicine

## 2020-02-25 DIAGNOSIS — I10 Essential (primary) hypertension: Secondary | ICD-10-CM

## 2020-02-25 DIAGNOSIS — D508 Other iron deficiency anemias: Secondary | ICD-10-CM

## 2020-02-25 DIAGNOSIS — I4821 Permanent atrial fibrillation: Secondary | ICD-10-CM | POA: Diagnosis not present

## 2020-02-25 DIAGNOSIS — N1832 Chronic kidney disease, stage 3b: Secondary | ICD-10-CM

## 2020-02-25 DIAGNOSIS — E039 Hypothyroidism, unspecified: Secondary | ICD-10-CM

## 2020-02-25 DIAGNOSIS — M792 Neuralgia and neuritis, unspecified: Secondary | ICD-10-CM | POA: Diagnosis not present

## 2020-02-25 DIAGNOSIS — N3000 Acute cystitis without hematuria: Secondary | ICD-10-CM

## 2020-02-25 DIAGNOSIS — E872 Acidosis, unspecified: Secondary | ICD-10-CM

## 2020-02-25 MED ORDER — METOPROLOL SUCCINATE ER 25 MG PO TB24: 75.0000 mg | ORAL_TABLET | Freq: Every day | ORAL | 0 refills | Status: DC

## 2020-02-25 MED ORDER — LEVOTHYROXINE SODIUM 75 MCG PO TABS: ORAL_TABLET | ORAL | 0 refills | Status: DC

## 2020-02-25 MED ORDER — OLOPATADINE HCL 0.2 % OP SOLN: 1.0000 [drp] | Freq: Every day | OPHTHALMIC | 0 refills | Status: DC

## 2020-02-25 MED ORDER — ALPRAZOLAM 0.25 MG PO TABS: 0.2500 mg | ORAL_TABLET | Freq: Two times a day (BID) | ORAL | 0 refills | Status: DC

## 2020-02-25 MED ORDER — GABAPENTIN 100 MG PO CAPS: 100.0000 mg | ORAL_CAPSULE | Freq: Every day | ORAL | 0 refills | Status: DC

## 2020-02-25 NOTE — Patient Outreach (Signed)
[place] Coordinator follow up  Received return call from [facility] son/DPR [name] [phone]. Patient identifiers confirmed. [name] affirms member will return home with him on 02/26/20.  Explained [place] Management services for [facility] for care coordination and complex case management. [name] is agreeable. Discussed whether he would be interested in palliative care referral for ongoing goals of care discussion. [name] states "when she gets home we are going to sit down and talk about what she wants. Let's hold off on that right now." [name] also declined [facility] for home visits.  Will make referral for [facility] for complex case management and care coordination.  Verified with facility that member will have [place].   Mrs. Fuell has a medical history of atrial fibrillation, aortic stenosis, iron deficiency anemia, HTN, HLD, hypothyroidism, RA, colovesical fistula, pulmonary edema, atrial fibrillation with RVR, severe aortic stenosis, adnexal mass, peripheral neuropathy.  Marthenia Rolling, MSN-Ed, RN,BSN [place] Acute Care Coordinator [phone] [facility]) [phone]  (Toll free office)

## 2020-02-25 NOTE — Patient Outreach (Signed)
Screened for potential [facility] Care Management needs as a benefit of  [employer].  [name] is currently receiving skilled therapy at [facility].   Writer attended telephonic interdisciplinary team meeting to assess for disposition needs and transition plan for resident.   Facility reports patient driven discharge is set for tomorrow 02/26/20 to home with son. Writer to follow up with facility SW to confirm home health agency. Member will go home on oxygen.   Telephone call made to son/DPR [name] [phone]. Patient identifiers confirmed. Merry Proud advises Probation officer to call his brother [name] to discuss [facility] services. Merry Proud states member lives with [name].  Telephone call made to Danila Rackham (son/DPR) on both numbers [phone] and [phone]. No answer. HIPAA compliant voicemail messages left on both lines to request return call.   Marthenia Rolling, MSN-Ed, RN,BSN [place] Acute Care Coordinator [phone] [facility]) [phone]  (Toll free office)

## 2020-02-25 NOTE — Progress Notes (Signed)
Location:    [facility] Room Number: [id number] Place of Service:  SNF (31)  Provider: Lorne Skeens  PCP: [name], NP Patient Care Team: [name], NP as PCP - General (Internal Medicine) O'Neal, Cassie Freer, MD as PCP - Cardiology (Internal Medicine) Gavin Pound, MD as Consulting Physician (Rheumatology) Tommy Medal, Lavell Islam, MD as Consulting Physician (Infectious Diseases) Bo Merino, MD as Consulting Physician (Rheumatology)  Extended Emergency Contact Information Primary Emergency Contact: [place] of [place] Phone: [phone] Relation: Son Secondary Emergency Contact: Teodora Medici [address] Johnnette Litter of [place] Phone: [phone] Mobile Phone: [phone] Relation: Son  Code Status: Full Code Goals of care:  Advanced Directive information Advanced Directives 02/25/2020  Does Patient Have a Medical Advance Directive? Yes  Type of Advance Directive (No Data)  Does patient want to make changes to medical advance directive? No - Patient declined  Copy of [place] in Chart? -  Would patient like information on creating a medical advance directive? -     Allergies  Allergen Reactions  . Infliximab Other (See Comments)    REACTION: "Enlarged intestines and hernia. Also pushed intestines on lower part of lungs." REACTION: "Enlarged intestines and hernia. Also pushed intestines on lower part of lungs."  . Aspirin Nausea And Vomiting    Low Dose is ok Stomach problems  . Carvedilol Other (See Comments)    Fatigue/ ill Fatigue/ ill  . Codeine Nausea And Vomiting    "Deathly Sick"  . Penicillins Rash    Has patient had a PCN reaction causing immediate rash, facial/tongue/throat swelling, SOB or lightheadedness with hypotension: Y Has patient had a PCN reaction causing severe rash involving mucus membranes or skin necrosis: Y Has  patient had a PCN reaction that required hospitalization: N Has patient had a PCN reaction occurring within the last 10 years: N If all of the above answers are "NO", then may proceed with Cephalosporin use.     Chief Complaint  Patient presents with  . Discharge Note    Discharge Visit    HPI:  86 y.o. female seen today for discharge from facility slated for tomorrow 11/28/2019  Patient has been here for short-term rehab after hospitalization for a UTI.  She has a history of atrial fibrillation not on anticoagulation secondary to her wishes as well as aortic stenosis iron deficiency anemia hypertension hyperlipidemia hypothyroidism rheumatoid arthritis and colovesicular fistula.  She presented to the hospital with worsening abdominal pain-she has been followed by urology as an outpatient but has refused surgery.  Apparently she has some history of chronic pain but it was worsening.  She also had burning with urination.  She was treated for acute cystitis with CT showing bladder wall thickening with gas and chronic hydronephrosis.  She was started on cephapirin and Flagyl-urine culture grew out E. coli she was switched to meropenem.  She also developed some mild pulmonary edema because of fluid overload which resolved with discontinuing her IV fluids.  Hospital course was complicated by acute on chronic kidney disease stage III with creatinine rising up to 1.8-this resolved with IV fluids-she was also thought to have metabolic acidosis was started on sodium bicarb tablets.  She also has a left adnexal mass which is suspicious for cancer but she does not want further evaluation.  She has been here for PT OT as well as to complete her antibiotics.  Her stay here has been unremarkable per nursing she is doing well-labs today show stability her hemoglobin is 8.3 which is an improvement from 7.9 last week.  Electrolytes appear to be within normal range her sodium is  142.  Creatinine is now 0.94.  She will be going home with her son-she will need PT OT and nursing support as well as oxygen supplementation.        Past Medical History:  Diagnosis Date  . A-fib ([place])   . Aortic stenosis, moderate    last echo in 2010; AVA 1.1; mild AS per echo in June 2013  . Edema of both legs    s/p laser treatment per Dr. Donnetta Hutching  . Hiatal hernia   . History of chicken pox   . HLD (hyperlipidemia)   . HTN (hypertension)   . Hypothyroidism   . IBS (irritable bowel syndrome)   . Iron deficiency anemia   . Neuropathic pain 09/06/2015  . Rheumatoid arthritis [facility])     Past Surgical History:  Procedure Laterality Date  . APPENDECTOMY  1955  . CARPAL TUNNEL RELEASE Right   . CATARACT EXTRACTION    . ENDOSCOPIC VEIN LASER TREATMENT    . ENDOVENOUS ABLATION SAPHENOUS VEIN W/ LASER  08-29-2012   left greater saphenous vein   Sherren Mocha Early MD  . ENDOVENOUS ABLATION SAPHENOUS VEIN W/ LASER  09-19-2012   right greater saphenous vein by Curt Jews MD  . EYE SURGERY  2005   Bilateral cataract  . FOOT SURGERY  2003   bilateral Hammer Toe  . stab phlebectomy Right 01-02-2013   10-15 incisions right thigh and calf by Curt Jews MD      reports that she quit smoking about 36 years ago. Her smoking use included cigarettes. She has never used smokeless tobacco. She reports that she does not drink alcohol or use drugs. Social History   Socioeconomic History  . Marital status: Widowed    Spouse name: Not on file  . Number of children: 2  . Years of education: Not on file  . Highest education level: Not on file  Occupational History  . Occupation: retired  Tobacco Use  . Smoking status: Former Smoker    Types: Cigarettes    Quit date: 10/31/1983    Years since quitting: 36.3  . Smokeless tobacco: Never Used  Substance and Sexual Activity  . Alcohol use: No  . Drug use: No  . Sexual activity: Never  Other Topics Concern  . Not on file  Social History  Narrative   Son lives with patient.   Social Determinants of Health   Financial Resource Strain:   . Difficulty of Paying Living Expenses:   Food Insecurity:   . Worried About Charity fundraiser in the Last Year:   . Arboriculturist in the Last Year:   Transportation Needs:   . Film/video editor (Medical):   Marland Kitchen Lack of Transportation (Non-Medical):   Physical Activity:   . Days of Exercise per Week:   . Minutes of Exercise per Session:   Stress:   . Feeling of Stress :   Social Connections:   . Frequency of Communication with Friends and Family:   . Frequency of Social Gatherings with Friends and Family:   . Attends Religious Services:   . Active Member of Clubs or Organizations:   . Attends Archivist Meetings:   Marland Kitchen Marital Status:   Intimate Partner Violence:   . Fear  of Current or Ex-Partner:   . Emotionally Abused:   Marland Kitchen Physically Abused:   . Sexually Abused:    Functional Status Survey:    Allergies  Allergen Reactions  . Infliximab Other (See Comments)    REACTION: "Enlarged intestines and hernia. Also pushed intestines on lower part of lungs." REACTION: "Enlarged intestines and hernia. Also pushed intestines on lower part of lungs."  . Aspirin Nausea And Vomiting    Low Dose is ok Stomach problems  . Carvedilol Other (See Comments)    Fatigue/ ill Fatigue/ ill  . Codeine Nausea And Vomiting    "Deathly Sick"  . Penicillins Rash    Has patient had a PCN reaction causing immediate rash, facial/tongue/throat swelling, SOB or lightheadedness with hypotension: Y Has patient had a PCN reaction causing severe rash involving mucus membranes or skin necrosis: Y Has patient had a PCN reaction that required hospitalization: N Has patient had a PCN reaction occurring within the last 10 years: N If all of the above answers are "NO", then may proceed with Cephalosporin use.     Pertinent  Health Maintenance Due  Topic Date Due  . DEXA SCAN  Never done   . INFLUENZA VACCINE  05/30/2020  . PNA vac Low Risk Adult  Completed    Medications: Allergies as of 02/25/2020      Reactions   Infliximab Other (See Comments)   REACTION: "Enlarged intestines and hernia. Also pushed intestines on lower part of lungs." REACTION: "Enlarged intestines and hernia. Also pushed intestines on lower part of lungs."   Aspirin Nausea And Vomiting   Low Dose is ok Stomach problems   Carvedilol Other (See Comments)   Fatigue/ ill Fatigue/ ill   Codeine Nausea And Vomiting   "Deathly Sick"   Penicillins Rash   Has patient had a PCN reaction causing immediate rash, facial/tongue/throat swelling, SOB or lightheadedness with hypotension: Y Has patient had a PCN reaction causing severe rash involving mucus membranes or skin necrosis: Y Has patient had a PCN reaction that required hospitalization: N Has patient had a PCN reaction occurring within the last 10 years: N If all of the above answers are "NO", then may proceed with Cephalosporin use.      Medication List       Accurate as of February 25, 2020 10:11 AM. If you have any questions, ask your nurse or doctor.        acetaminophen 500 MG tablet Commonly known as: TYLENOL Take 1,000 mg by mouth every 8 (eight) hours as needed (joint pain).   ALPRAZolam 0.25 MG tablet Commonly known as: XANAX Take 0.25 mg by mouth 2 (two) times daily. FOR ANXIETY/RESTLESS LEGS   aspirin 81 MG chewable tablet Chew 1 tablet (81 mg total) by mouth daily.   ertapenem  IVPB Commonly known as: INVANZ Inject 500 mg into the vein daily for 9 days. Indication: Complicated ESBL E. Coli UTI Last Day of Therapy: 02/23/2020 Labs - Once weekly:  CBC/D and BMP   ferrous sulfate 325 (65 FE) MG tablet Take 325 mg by mouth. With a meal   gabapentin 100 MG capsule Commonly known as: NEURONTIN Take 1 capsule (100 mg total) by mouth at bedtime.   levothyroxine 75 MCG tablet Commonly known as: SYNTHROID TAKE 1 TABLET BY MOUTH  ONCE DAILY BEFORE BREAKFAST   metoprolol succinate 25 MG 24 hr tablet Commonly known as: TOPROL-XL Take 3 tablets (75 mg total) by mouth daily.   multivitamin with minerals Tabs tablet  Take 1 tablet by mouth daily.   Olopatadine HCl 0.2 % Soln Place 1 drop into both eyes daily.       Review of Systems   General she is not complaining of any fever or chills.  Skin does not complain of rashes or itching.  Head ears eyes nose mouth and throat does not complain of visual changes or sore throat.  Respiratory is not complaining of being short of breath or having a cough she is on oxygen. -Has chronic lower extremity edema  Cardiac is not complaining of chest pain  GI is not complaining of abdominal pain at this point nausea vomiting diarrhea constipation.  GU is completing treatment for UTI does not really complain of suprapubic pain today.  Musculoskeletal-does not complain of joint pain at this time.  Neurologic planes of dizziness headache or focal weaknesses.  Or syncope.  Psych not complaining of being depressed or anxious    Vitals:   02/25/20 1006  BP: 134/90  Pulse: 77  Resp: 17  Temp: (!) 97.1 F (36.2 C)  TempSrc: Oral  SpO2: 100%  Weight: 128 lb 11.2 oz (58.4 kg)  Height: 5' (1.524 m)   Body mass index is 25.13 kg/m. Physical Exam General this is a pleasant elderly female in no distress.  Her skin is warm and dry.  Eyes visual acuity appears to be intact sclera and conjunctive are clear.  Chest is clear to auscultation with somewhat shallow air entry there is no labored breathing.  Heart is regular rate and rhythm with occasional irregular beats she does have a 3 out of 6 systolic murmur-appears to have 1+ lower extremity edema bilaterally.  Abdomen is soft nontender with positive bowel sounds.  Musculoskeletal does move all extremities x4 it appears at relative baseline she does use a walker.  Neurologic appears grossly intact cannot  appreciate lateralizing findings.  Psych she is pleasant and appropriate   Labs reviewed: February 26, 2020.  WBC 7.5 hemoglobin 8.3 platelets 426.  Sodium 142 potassium 4.4 BUN 9 creatinine 0.94.  February 19, 2020.  WBC 9.3 hemoglobin 7.9 platelets 323.  Sodium 141 potassium 3.9 BUN 11 creatinine 0.98.  Calcium 7.9  Basic Metabolic Panel: Recent Labs    07/03/19 1531 09/04/19 0509 09/05/19 0723 09/05/19 0803 09/12/19 1541 09/12/19 1541 10/14/19 1419 01/25/20 1943 02/12/20 0603 02/12/20 0603 02/13/20 0537 02/13/20 0537 02/14/20 0615 02/15/20 0846 02/16/20 0156  NA   < > 138   < >  --  135   < > 137  137   < > 139  --  138  --  140  --   --   K   < > 4.0   < >  --  4.7   < > 4.8  4.8   < > 4.6  --  4.4  --  3.9  --   --   CL   < > 98   < >  --  94*   < > 100  100   < > 114*  --  115*  --  117*  --   --   CO2   < > 29   < >  --  28   < > 29  29   < > 17*  --  17*  --  15*  --   --   GLUCOSE   < > 104*   < >  --  109*   < > 107*  107*   < >  154*  --  134*  --  102*  --   --   BUN   < > 11   < >  --  34*   < > 33*  33*   < > 34*  --  29*  --  25*  --   --   CREATININE   < > 1.07*   < >  --  1.38*  --  1.38*  1.38*   < > 1.81*   < > 1.57*   < > 1.54* 1.18* 1.08*  CALCIUM   < > 8.3*   < >  --  9.6   < > 9.7  9.7   < > 8.4*  --  8.4*  --  8.3*  --   --   MG  --  2.0  --  1.8 1.9  --   --   --   --   --   --   --   --   --   --   PHOS  --  3.3  --  3.9 4.7*  --  4.7*  4.7*  --   --   --   --   --   --   --   --    < > = values in this interval not displayed.   Liver Function Tests: Recent Labs    09/04/19 0509 09/04/19 0509 09/12/19 1541 10/14/19 1419 01/25/20 1943 02/10/20 0026  AST 19   < > 22  --  24 24  ALT 14   < > 11  --  16 15  ALKPHOS 78  --   --   --  108 101  BILITOT 0.8   < > 0.6  --  0.5 0.6  PROT 6.4*   < > 8.1  --  7.5 7.8  ALBUMIN 2.7*   < >  --  3.9 3.2* 3.6   < > = values in this interval not displayed.   Recent Labs    01/25/20 1943  02/10/20 0026  LIPASE 92* 98*   No results for input(s): AMMONIA in the last 8760 hours. CBC: Recent Labs    09/02/19 1726 09/03/19 0437 09/04/19 0509 09/04/19 0509 09/05/19 0803 09/12/19 1541 02/12/20 0603 02/13/20 0537 02/14/20 0615  WBC 11.4*   < > 7.2   < > 12.2*   < > 10.8* 16.8* 12.2*  NEUTROABS 8.7*  --  3.6  --  8.0*  --   --   --   --   HGB 9.9*   < > 9.6*   < > 10.4*   < > 7.5* 8.1* 7.8*  HCT 33.0*   < > 31.5*   < > 34.8*   < > 25.2* 27.0* 26.2*  MCV 89.4   < > 89.0   < > 89.2   < > 92.6 92.8 92.6  PLT 463*   < > 408*   < > 445*   < > 309 344 359   < > = values in this interval not displayed.   Cardiac Enzymes: No results for input(s): CKTOTAL, CKMB, CKMBINDEX, TROPONINI in the last 8760 hours. BNP: Invalid input(s): POCBNP CBG: Recent Labs    02/14/20 1002 02/15/20 0802 02/16/20 0802  GLUCAP 134* 100* 117*    Procedures and Imaging Studies During Stay: CT ABDOMEN PELVIS W CONTRAST  Result Date: 02/10/2020 CLINICAL DATA:  Generalized lower abdominal pain intermittently for 3 years. History of UTI. EXAM: CT ABDOMEN AND  PELVIS WITH CONTRAST TECHNIQUE: Multidetector CT imaging of the abdomen and pelvis was performed using the standard protocol following bolus administration of intravenous contrast. CONTRAST:  20mL OMNIPAQUE IOHEXOL 300 MG/ML  SOLN COMPARISON:  10/13/2018 CT abdomen/pelvis. FINDINGS: Lower chest: No significant pulmonary nodules or acute consolidative airspace disease. Right coronary atherosclerosis. Hepatobiliary: Normal liver size. No liver mass. Normal gallbladder with no radiopaque cholelithiasis. No biliary ductal dilatation. Pancreas: Normal, with no mass or duct dilation. Spleen: Normal size spleen. Low-attenuation subcentimeter inferior splenic lesion is unchanged and too small to characterize, considered benign. No new splenic lesions. Adrenals/Urinary Tract: Normal adrenals. Chronic moderate left hydroureteronephrosis to the level of the upper  left pelvic ureter with worsening urothelial wall thickening and hyperenhancement in the dilated portion of the upper left urinary tract. Asymmetrically delayed left contrast nephrogram. No right hydronephrosis. Subcentimeter hypodense posterior interpolar right renal cortical lesion is too small to characterize and is unchanged, considered benign. No additional renal lesions. Diffuse bladder wall thickening with gas in the nondependent bladder lumen. There is loss of the normal fat plane between the sigmoid colon and left superior bladder wall with intervening gas and soft tissue most compatible with a chronic colovesical fistula, unchanged from prior CT (series 4/image 53). Stomach/Bowel: Large hiatal hernia. Partially visualized stomach is nondistended and otherwise normal. Normal caliber small bowel with no small bowel wall thickening. Appendectomy. Oral contrast transits to the rectum. Moderate diffuse colonic diverticulosis. There is marked wall thickening and mucosal hyperenhancement throughout sigmoid colon with some associated pericolonic fat stranding, not substantially changed from 05/26/2019 CT. Evidence of chronic colovesical fistula between sigmoid colon and left superior bladder wall as detailed above. No discrete drainable pericolonic abscess on today's scan. Vascular/Lymphatic: Atherosclerotic nonaneurysmal abdominal aorta. Patent portal, splenic and renal veins. No pathologically enlarged lymph nodes in the abdomen or pelvis. Reproductive: Complex lobulated 6.2 x 4.7 cm cystic left adnexal mass with mixed internal cystic and soft tissue density (series 2/image 50), previously 6.3 x 5.2 cm, not substantially changed. No right adnexal mass. Unremarkable uterus. Other: No ascites. Musculoskeletal: No aggressive appearing focal osseous lesions. Moderate thoracolumbar spondylosis. IMPRESSION: 1. Chronic severe diverticular disease in the sigmoid colon with chronic colovesical fistula. Underlying bladder  neoplasm cannot be excluded by imaging, however is less favored given the chronicity of this process. No discrete drainable pericolonic abscess on today's scan. No bowel obstruction. 2. Chronic left urinary tract stricture at the level of the upper left pelvic ureter with moderate left hydroureteronephrosis, presumably due to extrinsic involvement by the chronic inflammatory diverticular process in the sigmoid colon. Worsening urothelial wall thickening and hyperenhancement in the dilated upper left urinary tract, suggesting ascending left urinary tract infection. 3. Diffuse bladder wall thickening with bladder gas, suggesting active cystitis in this patient with colovesical fistula. 4. Stable complex lobulated 6.2 cm cystic left adnexal mass, indeterminate for cystic ovarian neoplasm. 5. Large hiatal hernia. 6. Coronary atherosclerosis. 7. Aortic Atherosclerosis (ICD10-I70.0). Electronically Signed   By: Ilona Sorrel M.D.   On: 02/10/2020 10:31   DG CHEST PORT 1 VIEW  Result Date: 02/15/2020 CLINICAL DATA:  Wheezing. EXAM: PORTABLE CHEST 1 VIEW COMPARISON:  1 day prior FINDINGS: Patient rotated right. Mild cardiomegaly. Hiatal hernia. Possible tiny right pleural effusion. No pneumothorax. Right PICC line tip at caval atrial junction. Suspect developing mild pulmonary venous congestion. Persistent bibasilar atelectasis. Numerous leads and wires project over the chest. IMPRESSION: 1. Cardiomegaly with suspicion of developing mild pulmonary venous congestion. Possible layering right pleural effusion. 2. Similar  bibasilar atelectasis. 3. Hiatal hernia. Electronically Signed   By: Abigail Miyamoto M.D.   On: 02/15/2020 08:07   DG CHEST PORT 1 VIEW  Result Date: 02/14/2020 CLINICAL DATA:  PICC placement EXAM: PORTABLE CHEST 1 VIEW COMPARISON:  02/10/2020 chest radiograph. FINDINGS: Right PICC terminates over the cavoatrial junction. Stable cardiomediastinal silhouette with mild cardiomegaly and moderate hiatal  hernia. No pneumothorax. No pleural effusion. No overt pulmonary edema. Mild medial bibasilar atelectasis. IMPRESSION: 1. Right PICC terminates over the cavoatrial junction. 2. Stable mild cardiomegaly without overt pulmonary edema. 3. Mild medial bibasilar atelectasis. Moderate hiatal hernia. Electronically Signed   By: Ilona Sorrel M.D.   On: 02/14/2020 17:13   DG Chest Port 1 View  Result Date: 02/10/2020 CLINICAL DATA:  Shortness of breath this morning EXAM: PORTABLE CHEST 1 VIEW COMPARISON:  09/12/2019 FINDINGS: Chronic enlarged cardiopericardial size accentuated by a large hiatal hernia. There is no edema, consolidation, effusion, or pneumothorax. Lung volumes are low IMPRESSION: 1. Cardiomegaly without failure. 2. Hiatal hernia. Electronically Signed   By: Monte Fantasia M.D.   On: 02/10/2020 06:43   [name] EKG SITE RITE  Result Date: 02/13/2020 If Site Rite image not attached, placement could not be confirmed due to current cardiac rhythm.   Assessment/Plan:    #1 history of acute cystitis with chronic colovesicular fistula-left-sided hydronephrosis-she was started on cephapirin and Flagyl and urine culture grew out ESBL E. coli-was switched  to meropenem-she is on ertapenem has just finished that as of today-patient does not want any further surgery-  #2 history of pulmonary edema-complicated with acute kidney injury on top of chronic stage III kidney disease-this was thought due to fluid overload-resolved with stopping IV fluids-creatinine did rise up to 1.8-it is now down to [id number].  3.  Metabolic acidosis-one-time bicarb of 15 it is now 24 an updated lab she is on sodium bicarb tablets-will need follow-up by primary care provider.  4.-History of normocytic anemia-hemoglobin appears to be rising at 8.3 today it was 7.9 she is on iron 325 mg a day.  5.  History of left adnexal mass-this was 6.2 cm is thought to be stable-suspicious for ovarian neoplasm-patient has expressed desires for no  further work-up.  6.  History of hypothyroidism she is on Synthroid 75 mcg a day not stated as uncontrolled-will warrant follow-up by primary care provider.  7.  History of neuropathy continues on 100 mg of Neurontin nightly at this point appears to be stable.  8.  History of atrial fibrillation this appears to be rate controlled on Toprol-XL 75 mg a day she is also on aspirin 81 mg a day for prophylaxis.   History of anxiety she is on trazodone 0.25 mg twice daily appears to be effective and well-tolerated.  Again she will be going home she will be with her son she will need expedient primary care follow-up as well as PT OT and nursing support-she also will need oxygen via nasal cannula at 2 L/min-.  [id number] note greater than 30 minutes spent on this discharge summary-greater than 50% of time spent coordinating a plan of care for numerous diagnoses

## 2020-02-27 ENCOUNTER — Encounter: Payer: Self-pay | Admitting: Internal Medicine

## 2020-03-01 ENCOUNTER — Telehealth: Payer: Self-pay | Admitting: Nurse Practitioner

## 2020-03-01 ENCOUNTER — Other Ambulatory Visit: Payer: Self-pay | Admitting: *Deleted

## 2020-03-01 DIAGNOSIS — I5041 Acute combined systolic (congestive) and diastolic (congestive) heart failure: Secondary | ICD-10-CM

## 2020-03-01 DIAGNOSIS — R0902 Hypoxemia: Secondary | ICD-10-CM

## 2020-03-01 NOTE — Telephone Encounter (Signed)
[name] please advise.   Pt son [name]) is also calling to see if you could order pt a small oxygen tank and have it sent to [facility].

## 2020-03-01 NOTE — Telephone Encounter (Signed)
Eldridge Scot is calling and wanted to let [name] know that patient wanted to change her order from PT/ OT nursing to only PT to start on 5/7/. Pls advise [phone]

## 2020-03-01 NOTE — Telephone Encounter (Signed)
Patient son [name]) is calling and wanted to see if [name] could order patient a small over the shoulder oxygen tank sent to [facility].  CB is [phone]

## 2020-03-01 NOTE — Telephone Encounter (Signed)
Pt's son [name] was notified about making a follow up appointment he stated that pt couldn't leave the house without small oxygen tank and that it was urgent she has to have a small tank to even come in for a visit.   Home Health was also contacted, the pt's son, was also the one that cancelled the OT and requested just PT, he felt the OT wasn't necessary.

## 2020-03-01 NOTE — Patient Outreach (Signed)
[facility]) Care Management  03/01/2020  Barbe Thorup August 31, 1927 [id number]   Referral 4/29 Initial Outreach 5/3 Post-op discharged SNF  Telephone Assessment-Enrolled (CHF)  RN spoke with the DPR son Domino Fontana) who provider today's information. RN introduced [facility] and the purpose for today's call. Son very receptive and discuss pt's current issues and involvement with other agencies. Verified pt will have Experiment with [facility]. Pt lives with the pt and is the primary caregiver for this pt. RN inquired on pt's medical history and recent SNF. Son states pt main issues at this time related to SOB however can't find the source as pt states pt's CHF is controlled with no swelling noted anywhere. States pt had  History of edema but this has resolved with no other related issues. States pt's pulmonologist pending further exam. Son states he will be contact Dr. Lorayne Marek for possible office visit if needed due to 'ts recent discharged. All medical condition discussed as the primary caregiver would like more education about pt's CHF.   Case Manager discussed a plan of care related to CHF and offered to enroll pt into the [facility] program for services. Son very receptive and would like monthly follow up call but receptive to a follow up call next week to complete the initial assessment. Offered to review the pt's medication list however son indicates this has been done and pt has a medication dispense (decline this offer). RN stress the importance that pt follow the recommended medication regimen to avoid acute events. Quickly reveiwed all medications and verified pt has a sufficient of refills. All goals and interventions were discussed along with educations on the HF zones and symptom related issues. RN will notify pt's primary provider on pt's disposition with [facility] along with the plan of care. No further questions or inquires at this time.  [facility] CM Care Plan Problem One     Most Recent  Value  Care Plan Problem One  Deficient Knowledge related to CHF unfamiliarity with information/education  Role Documenting the Problem One  Care Management Telephonic Coordinator  Care Plan for Problem One  Active  THN Long Term Goal   Pt/Caregiver will verbalize a plan of action in the YELLOW zone within the next 90 days.  THN Long Term Goal Start Date  03/01/20  Interventions for Problem One Long Term Goal  Will eductaion on the HF zones and wht to do if acute symptoms occur. Will send printable educational material vi emmi and review accordingy for ongiong increase in caregiver's knowledge base. Will stress the importance of early intervention to prevent acute symtpoms leading to the red zone.  THN CM Short Term Goal #1   Caregiver/pt will verbalize maintaining wight within the 2 lbs of goal within the next 30 days.  THN CM Short Term Goal #1 Start Date  03/01/20  Interventions for Short Term Goal #1  Will discuss the importance of avoiding fluid retention with daily weights to avoid acute symptoms from occuring. Will education on 3 lbs overnight and 5 lbs within one week to contact the pt's provider to avoid other cute symptoms from occuring. Will strongly enocurage caregiver to review the printed education materil that will be sent the address.  THN CM Short Term Goal #2   Pt will keep all medical appointments scheduled within the next 30 days.  THN CM Short Term Goal #2 Start Date  03/01/20  Interventions for Short Term Goal #2  Will verify pt's has sufficient transportation to all  pending medical appointment and stress the importance of pt's attendance. Will offer additinal sources of transportation if needed.      Raina Mina, RN Care Management Coordinator [place] Office [phone]

## 2020-03-01 NOTE — Telephone Encounter (Signed)
[name] and [name] had declined OT through [facility] due to lack of progress with their service in past.  Since it has been several year since this occurred, I recommended for them to try [facility] services (PT and OT) again. If they had same negative expereince, they can request for another home health company for PT and OT. They agreed and stated they will call and schedule appt for OT eval as well.  [name] was discharge home from [facility] facility on 02/27/2020. She was discharge with continuous oxygen 2L via nasal cannula. She has an oxygen concentrator at home, but needs portable tank order sent to [facility]. Order entered.

## 2020-03-01 NOTE — Telephone Encounter (Signed)
What is the reason for discontinuing OT? She needs hospital f/up appt in office (F2F, 79mins) to discuss need for oxygen. Please have Ronnie complete TCM call prior to this appt.

## 2020-03-02 NOTE — Telephone Encounter (Signed)
Faxed orders to [facility] for portable oxygen tank.

## 2020-03-03 ENCOUNTER — Inpatient Hospital Stay: Payer: Medicare Other | Admitting: Nurse Practitioner

## 2020-03-05 ENCOUNTER — Telehealth: Payer: Self-pay | Admitting: Nurse Practitioner

## 2020-03-05 DIAGNOSIS — I350 Nonrheumatic aortic (valve) stenosis: Secondary | ICD-10-CM | POA: Diagnosis not present

## 2020-03-05 DIAGNOSIS — D631 Anemia in chronic kidney disease: Secondary | ICD-10-CM | POA: Diagnosis not present

## 2020-03-05 DIAGNOSIS — I130 Hypertensive heart and chronic kidney disease with heart failure and stage 1 through stage 4 chronic kidney disease, or unspecified chronic kidney disease: Secondary | ICD-10-CM | POA: Diagnosis not present

## 2020-03-05 DIAGNOSIS — M150 Primary generalized (osteo)arthritis: Secondary | ICD-10-CM | POA: Diagnosis not present

## 2020-03-05 DIAGNOSIS — I5022 Chronic systolic (congestive) heart failure: Secondary | ICD-10-CM | POA: Diagnosis not present

## 2020-03-05 DIAGNOSIS — Z1612 Extended spectrum beta lactamase (ESBL) resistance: Secondary | ICD-10-CM | POA: Diagnosis not present

## 2020-03-05 DIAGNOSIS — N136 Pyonephrosis: Secondary | ICD-10-CM | POA: Diagnosis not present

## 2020-03-05 DIAGNOSIS — N3090 Cystitis, unspecified without hematuria: Secondary | ICD-10-CM | POA: Diagnosis not present

## 2020-03-05 DIAGNOSIS — Z7982 Long term (current) use of aspirin: Secondary | ICD-10-CM | POA: Diagnosis not present

## 2020-03-05 DIAGNOSIS — M069 Rheumatoid arthritis, unspecified: Secondary | ICD-10-CM | POA: Diagnosis not present

## 2020-03-05 DIAGNOSIS — N3000 Acute cystitis without hematuria: Secondary | ICD-10-CM | POA: Diagnosis not present

## 2020-03-05 DIAGNOSIS — K449 Diaphragmatic hernia without obstruction or gangrene: Secondary | ICD-10-CM | POA: Diagnosis not present

## 2020-03-05 DIAGNOSIS — G629 Polyneuropathy, unspecified: Secondary | ICD-10-CM | POA: Diagnosis not present

## 2020-03-05 DIAGNOSIS — Z87891 Personal history of nicotine dependence: Secondary | ICD-10-CM | POA: Diagnosis not present

## 2020-03-05 DIAGNOSIS — N321 Vesicointestinal fistula: Secondary | ICD-10-CM | POA: Diagnosis not present

## 2020-03-05 DIAGNOSIS — R1909 Other intra-abdominal and pelvic swelling, mass and lump: Secondary | ICD-10-CM | POA: Diagnosis not present

## 2020-03-05 DIAGNOSIS — E039 Hypothyroidism, unspecified: Secondary | ICD-10-CM | POA: Diagnosis not present

## 2020-03-05 DIAGNOSIS — B9620 Unspecified Escherichia coli [E. coli] as the cause of diseases classified elsewhere: Secondary | ICD-10-CM | POA: Diagnosis not present

## 2020-03-05 DIAGNOSIS — N183 Chronic kidney disease, stage 3 unspecified: Secondary | ICD-10-CM | POA: Diagnosis not present

## 2020-03-05 DIAGNOSIS — I4821 Permanent atrial fibrillation: Secondary | ICD-10-CM | POA: Diagnosis not present

## 2020-03-05 DIAGNOSIS — I872 Venous insufficiency (chronic) (peripheral): Secondary | ICD-10-CM | POA: Diagnosis not present

## 2020-03-05 DIAGNOSIS — E785 Hyperlipidemia, unspecified: Secondary | ICD-10-CM | POA: Diagnosis not present

## 2020-03-05 DIAGNOSIS — D509 Iron deficiency anemia, unspecified: Secondary | ICD-10-CM | POA: Diagnosis not present

## 2020-03-05 DIAGNOSIS — K589 Irritable bowel syndrome without diarrhea: Secondary | ICD-10-CM | POA: Diagnosis not present

## 2020-03-05 NOTE — Telephone Encounter (Signed)
[facility] is calling for verbal orders to continue to work with patient in the home. Orders are 1x a week for 1 week , 2x a week for 3 weeks and 1 time a week for 5 weeks. CB is [phone]

## 2020-03-08 ENCOUNTER — Other Ambulatory Visit: Payer: Self-pay | Admitting: *Deleted

## 2020-03-08 NOTE — Telephone Encounter (Signed)
Verbal orders given to [employer] at [facility].

## 2020-03-08 NOTE — Telephone Encounter (Signed)
ok 

## 2020-03-08 NOTE — Telephone Encounter (Signed)
[name] please advise.  [facility] is calling for verbal orders to continue home health care.   Orders are 1x per week for 1 week, 2x a week for 3 weeks and 1x a week for 5 weeks.

## 2020-03-08 NOTE — Patient Outreach (Signed)
[facility] [facility]) Care Management  03/08/2020  [name] [birth year] [id number]   Telephone Assessment-Unsuccessful  RN attempted outreach call however unsuccessful. RN able to leave a HIPAA approved voice message requesting a call back.  Note message left with home contact # and the son [name] mobile contact. Will rescheduled another call over the next week for ongoing services.  Raina Mina, RN Care Management Coordinator [place] Office [phone]

## 2020-03-09 DIAGNOSIS — I130 Hypertensive heart and chronic kidney disease with heart failure and stage 1 through stage 4 chronic kidney disease, or unspecified chronic kidney disease: Secondary | ICD-10-CM | POA: Diagnosis not present

## 2020-03-09 DIAGNOSIS — N136 Pyonephrosis: Secondary | ICD-10-CM | POA: Diagnosis not present

## 2020-03-09 DIAGNOSIS — B9620 Unspecified Escherichia coli [E. coli] as the cause of diseases classified elsewhere: Secondary | ICD-10-CM | POA: Diagnosis not present

## 2020-03-09 DIAGNOSIS — Z1612 Extended spectrum beta lactamase (ESBL) resistance: Secondary | ICD-10-CM | POA: Diagnosis not present

## 2020-03-09 DIAGNOSIS — N3000 Acute cystitis without hematuria: Secondary | ICD-10-CM | POA: Diagnosis not present

## 2020-03-09 DIAGNOSIS — N321 Vesicointestinal fistula: Secondary | ICD-10-CM | POA: Diagnosis not present

## 2020-03-11 DIAGNOSIS — N136 Pyonephrosis: Secondary | ICD-10-CM | POA: Diagnosis not present

## 2020-03-11 DIAGNOSIS — N321 Vesicointestinal fistula: Secondary | ICD-10-CM | POA: Diagnosis not present

## 2020-03-11 DIAGNOSIS — B9620 Unspecified Escherichia coli [E. coli] as the cause of diseases classified elsewhere: Secondary | ICD-10-CM | POA: Diagnosis not present

## 2020-03-11 DIAGNOSIS — I130 Hypertensive heart and chronic kidney disease with heart failure and stage 1 through stage 4 chronic kidney disease, or unspecified chronic kidney disease: Secondary | ICD-10-CM | POA: Diagnosis not present

## 2020-03-11 DIAGNOSIS — N3000 Acute cystitis without hematuria: Secondary | ICD-10-CM | POA: Diagnosis not present

## 2020-03-11 DIAGNOSIS — Z1612 Extended spectrum beta lactamase (ESBL) resistance: Secondary | ICD-10-CM | POA: Diagnosis not present

## 2020-03-11 NOTE — Telephone Encounter (Signed)
[facility] was called and confirmed that an oxygen tank was delivered to the patient today.   I tried to call patient to make sure they received the tank and couldn't get them I left the patient a message stating to let us know if it was delivered or not and if they had any questions or concerns to call the office back.

## 2020-03-11 NOTE — Telephone Encounter (Signed)
Patient is calling back to check the status of oxygen tank. Pls advise. CB is [phone]

## 2020-03-15 ENCOUNTER — Encounter: Payer: Self-pay | Admitting: *Deleted

## 2020-03-15 ENCOUNTER — Other Ambulatory Visit: Payer: Self-pay | Admitting: *Deleted

## 2020-03-15 NOTE — Patient Outreach (Addendum)
[facility]) Care Management  03/15/2020  Eulalee Shindler 25-Jul-1927 [id number]   Telephone Assessment (Completed initial assessment)  RN completed the initial assessment with the son Rhett Bannister). No major issues at this time as pt continues to weight with supportive family to assist with her ongoing ADLs. Denies any HF symptoms today remaining in the GREEN zone. Will continue to educate pt's caregiver on when to call the provider with any precipitating symptoms to avoid acute symptoms from getting worse.  Son states he is working with [facility] on obtaining a portable O2 tank so pt can be more portable in living the home however the agency has this item on back order at this time. Note pt has sufficient oxygen just was requesting portable tanks for increase in her mobility. Son is aware if this is not resolved soon or further issues or barriers are preventing pt from obtaining this DME to please contact this RN to assist further.  Plan: Will follow up next month as scheduled for ongoing HF management of care and pt's receival of the portable tanks.   Raina Mina, RN Care Management Coordinator [place] Office [phone]

## 2020-03-16 ENCOUNTER — Telehealth: Payer: Self-pay | Admitting: Nurse Practitioner

## 2020-03-16 DIAGNOSIS — I5041 Acute combined systolic (congestive) and diastolic (congestive) heart failure: Secondary | ICD-10-CM

## 2020-03-16 DIAGNOSIS — R0902 Hypoxemia: Secondary | ICD-10-CM

## 2020-03-16 NOTE — Telephone Encounter (Signed)
Patient's son called and said she needs a prescription for a shoulder bag for her oxygen.

## 2020-03-16 NOTE — Telephone Encounter (Signed)
[name] please advise.  Pt's son is asking for a RX for a shoulder bag for pt's oxygen.

## 2020-03-16 NOTE — Telephone Encounter (Signed)
I am not sure how this order is written. I assumed this will come with portable tank. Ask DME company how order should be written. Thank you

## 2020-03-17 NOTE — Telephone Encounter (Signed)
Patient son is calling back about oxygen tank. CB is [phone]

## 2020-03-18 DIAGNOSIS — N321 Vesicointestinal fistula: Secondary | ICD-10-CM | POA: Diagnosis not present

## 2020-03-18 DIAGNOSIS — B9620 Unspecified Escherichia coli [E. coli] as the cause of diseases classified elsewhere: Secondary | ICD-10-CM | POA: Diagnosis not present

## 2020-03-18 DIAGNOSIS — I130 Hypertensive heart and chronic kidney disease with heart failure and stage 1 through stage 4 chronic kidney disease, or unspecified chronic kidney disease: Secondary | ICD-10-CM | POA: Diagnosis not present

## 2020-03-18 DIAGNOSIS — N3000 Acute cystitis without hematuria: Secondary | ICD-10-CM | POA: Diagnosis not present

## 2020-03-18 DIAGNOSIS — N136 Pyonephrosis: Secondary | ICD-10-CM | POA: Diagnosis not present

## 2020-03-18 DIAGNOSIS — Z1612 Extended spectrum beta lactamase (ESBL) resistance: Secondary | ICD-10-CM | POA: Diagnosis not present

## 2020-03-18 NOTE — Telephone Encounter (Signed)
[facility] was contacted and a portable oxygen shoulder bag is ordered and should arrive in 3-5 business days.  Left voicemail to let pt know and to call if any questions or concerns.

## 2020-03-23 ENCOUNTER — Other Ambulatory Visit: Payer: Self-pay | Admitting: Internal Medicine

## 2020-03-25 DIAGNOSIS — N321 Vesicointestinal fistula: Secondary | ICD-10-CM | POA: Diagnosis not present

## 2020-03-25 DIAGNOSIS — Z1612 Extended spectrum beta lactamase (ESBL) resistance: Secondary | ICD-10-CM | POA: Diagnosis not present

## 2020-03-25 DIAGNOSIS — N136 Pyonephrosis: Secondary | ICD-10-CM | POA: Diagnosis not present

## 2020-03-25 DIAGNOSIS — I130 Hypertensive heart and chronic kidney disease with heart failure and stage 1 through stage 4 chronic kidney disease, or unspecified chronic kidney disease: Secondary | ICD-10-CM | POA: Diagnosis not present

## 2020-03-25 DIAGNOSIS — N3000 Acute cystitis without hematuria: Secondary | ICD-10-CM | POA: Diagnosis not present

## 2020-03-25 DIAGNOSIS — B9620 Unspecified Escherichia coli [E. coli] as the cause of diseases classified elsewhere: Secondary | ICD-10-CM | POA: Diagnosis not present

## 2020-03-26 NOTE — Telephone Encounter (Signed)
Patient called back and stated that she still haven't received her portable oxygen. Pls advise. CB is [phone]

## 2020-03-30 NOTE — Telephone Encounter (Signed)
Left voicemail for pt to call office and let us know if she received her shoulder bag for her oxygen.

## 2020-03-30 NOTE — Telephone Encounter (Signed)
Patient is returning the call. CB is [phone]

## 2020-03-30 NOTE — Telephone Encounter (Signed)
Left voicemail for pt informing her [facility] was contacted and there was a billing problem there was a wrong code put in by [facility] and they are working to fix it and ship to pt in 2-3 business days. Pt was told if they had any questions or concerns to give [name] a call back.

## 2020-03-31 DIAGNOSIS — N3000 Acute cystitis without hematuria: Secondary | ICD-10-CM | POA: Diagnosis not present

## 2020-03-31 DIAGNOSIS — N321 Vesicointestinal fistula: Secondary | ICD-10-CM | POA: Diagnosis not present

## 2020-03-31 DIAGNOSIS — B9620 Unspecified Escherichia coli [E. coli] as the cause of diseases classified elsewhere: Secondary | ICD-10-CM | POA: Diagnosis not present

## 2020-03-31 DIAGNOSIS — Z1612 Extended spectrum beta lactamase (ESBL) resistance: Secondary | ICD-10-CM | POA: Diagnosis not present

## 2020-03-31 DIAGNOSIS — I130 Hypertensive heart and chronic kidney disease with heart failure and stage 1 through stage 4 chronic kidney disease, or unspecified chronic kidney disease: Secondary | ICD-10-CM | POA: Diagnosis not present

## 2020-03-31 DIAGNOSIS — N136 Pyonephrosis: Secondary | ICD-10-CM | POA: Diagnosis not present

## 2020-04-01 NOTE — Telephone Encounter (Signed)
Left a voicemail per DPR letting them know that [facility] was contacted and stated it was shipped on 03/30/20 and they said to allow 2-3 business days for delivery. I also left [facility]'s phone number for them to call and check on the status as well. Pt was also told that if they have any questions or concerns or don't receive the shoulder bag to give [name] a call back.

## 2020-04-01 NOTE — Telephone Encounter (Signed)
Patient's son called and stated they have not yet gotten the shoulder bag and every time he calls [facility], they have a service that takes messages and he never gets a return phone call. Is there another source they get this from? Please call son with update.

## 2020-04-02 NOTE — Telephone Encounter (Signed)
Patient called to check status of shoulder bag. Stated she did not receive voicemail information. I verified her phone number and then gave her previous information that it was shipped on 03/30/20 with 2-3 day shipping. Patient stated she was unhappy with [facility] and would not use them after she receives this item.

## 2020-04-04 DIAGNOSIS — G629 Polyneuropathy, unspecified: Secondary | ICD-10-CM | POA: Diagnosis not present

## 2020-04-04 DIAGNOSIS — N321 Vesicointestinal fistula: Secondary | ICD-10-CM | POA: Diagnosis not present

## 2020-04-04 DIAGNOSIS — Z1612 Extended spectrum beta lactamase (ESBL) resistance: Secondary | ICD-10-CM | POA: Diagnosis not present

## 2020-04-04 DIAGNOSIS — I350 Nonrheumatic aortic (valve) stenosis: Secondary | ICD-10-CM | POA: Diagnosis not present

## 2020-04-04 DIAGNOSIS — K589 Irritable bowel syndrome without diarrhea: Secondary | ICD-10-CM | POA: Diagnosis not present

## 2020-04-04 DIAGNOSIS — R1909 Other intra-abdominal and pelvic swelling, mass and lump: Secondary | ICD-10-CM | POA: Diagnosis not present

## 2020-04-04 DIAGNOSIS — E039 Hypothyroidism, unspecified: Secondary | ICD-10-CM | POA: Diagnosis not present

## 2020-04-04 DIAGNOSIS — D509 Iron deficiency anemia, unspecified: Secondary | ICD-10-CM | POA: Diagnosis not present

## 2020-04-04 DIAGNOSIS — M150 Primary generalized (osteo)arthritis: Secondary | ICD-10-CM | POA: Diagnosis not present

## 2020-04-04 DIAGNOSIS — N183 Chronic kidney disease, stage 3 unspecified: Secondary | ICD-10-CM | POA: Diagnosis not present

## 2020-04-04 DIAGNOSIS — K449 Diaphragmatic hernia without obstruction or gangrene: Secondary | ICD-10-CM | POA: Diagnosis not present

## 2020-04-04 DIAGNOSIS — M069 Rheumatoid arthritis, unspecified: Secondary | ICD-10-CM | POA: Diagnosis not present

## 2020-04-04 DIAGNOSIS — Z87891 Personal history of nicotine dependence: Secondary | ICD-10-CM | POA: Diagnosis not present

## 2020-04-04 DIAGNOSIS — Z7982 Long term (current) use of aspirin: Secondary | ICD-10-CM | POA: Diagnosis not present

## 2020-04-04 DIAGNOSIS — N3000 Acute cystitis without hematuria: Secondary | ICD-10-CM | POA: Diagnosis not present

## 2020-04-04 DIAGNOSIS — E785 Hyperlipidemia, unspecified: Secondary | ICD-10-CM | POA: Diagnosis not present

## 2020-04-04 DIAGNOSIS — D631 Anemia in chronic kidney disease: Secondary | ICD-10-CM | POA: Diagnosis not present

## 2020-04-04 DIAGNOSIS — I4821 Permanent atrial fibrillation: Secondary | ICD-10-CM | POA: Diagnosis not present

## 2020-04-04 DIAGNOSIS — I130 Hypertensive heart and chronic kidney disease with heart failure and stage 1 through stage 4 chronic kidney disease, or unspecified chronic kidney disease: Secondary | ICD-10-CM | POA: Diagnosis not present

## 2020-04-04 DIAGNOSIS — I5022 Chronic systolic (congestive) heart failure: Secondary | ICD-10-CM | POA: Diagnosis not present

## 2020-04-04 DIAGNOSIS — N136 Pyonephrosis: Secondary | ICD-10-CM | POA: Diagnosis not present

## 2020-04-04 DIAGNOSIS — I872 Venous insufficiency (chronic) (peripheral): Secondary | ICD-10-CM | POA: Diagnosis not present

## 2020-04-04 DIAGNOSIS — B9620 Unspecified Escherichia coli [E. coli] as the cause of diseases classified elsewhere: Secondary | ICD-10-CM | POA: Diagnosis not present

## 2020-04-05 NOTE — Telephone Encounter (Signed)
Patient called back and wanted to let [name] know that she still has not received her oxygen tank and would like to speak to someone. CB is [phone]

## 2020-04-06 NOTE — Telephone Encounter (Signed)
Patient son is calling back regarding oxygen. CB is [phone]

## 2020-04-06 NOTE — Telephone Encounter (Signed)
Patient son called back and wanted to see if they can switch companies. Son stated that patient hasn't been able to get out of the house due to not having a portable oxygen. Patient has been waiting for weeks for portable shoulder tank. Pls advise. CB is [phone]

## 2020-04-07 ENCOUNTER — Telehealth: Payer: Self-pay | Admitting: Nurse Practitioner

## 2020-04-07 DIAGNOSIS — N321 Vesicointestinal fistula: Secondary | ICD-10-CM | POA: Diagnosis not present

## 2020-04-07 DIAGNOSIS — I130 Hypertensive heart and chronic kidney disease with heart failure and stage 1 through stage 4 chronic kidney disease, or unspecified chronic kidney disease: Secondary | ICD-10-CM | POA: Diagnosis not present

## 2020-04-07 DIAGNOSIS — N136 Pyonephrosis: Secondary | ICD-10-CM | POA: Diagnosis not present

## 2020-04-07 DIAGNOSIS — N3000 Acute cystitis without hematuria: Secondary | ICD-10-CM | POA: Diagnosis not present

## 2020-04-07 DIAGNOSIS — Z1612 Extended spectrum beta lactamase (ESBL) resistance: Secondary | ICD-10-CM | POA: Diagnosis not present

## 2020-04-07 DIAGNOSIS — B9620 Unspecified Escherichia coli [E. coli] as the cause of diseases classified elsewhere: Secondary | ICD-10-CM | POA: Diagnosis not present

## 2020-04-07 NOTE — Telephone Encounter (Signed)
Left a message for [name] to give [name] a call back for clarification.

## 2020-04-07 NOTE — Telephone Encounter (Addendum)
[name] called and stated that she saw patient today and wanted to see if [name] can put an order for patient to see a Education officer, museum. Caller stated that patient stated that she was depressed and having issues with her son. Patient didn't say she felt unsafe but needed guidance. Pls advise. CB is [phone]

## 2020-04-07 NOTE — Telephone Encounter (Signed)
[facility] was called again an was informed that the shoulder bag that was ordered is now on backorder an will ship when it comes in stock. [name] was called and informed but also told pt he can get this from a medical supply store and he asked if he needed a prescription and he was told he didn't. Pt's son stated he was gonna go to [facility] and let us know if he has any issues obtaining the shoulder bag.

## 2020-04-07 NOTE — Telephone Encounter (Signed)
[name] please advise pt.  [name] called asking if an order can be put in for a Education officer, museum. She seen pt today and said pt stated she was depressed and having issues with son.

## 2020-04-07 NOTE — Telephone Encounter (Signed)
Call number provided for clarification

## 2020-04-08 NOTE — Addendum Note (Signed)
Addended by: Wilfred Lacy L on: 04/08/2020 12:54 PM   Modules accepted: Orders

## 2020-04-08 NOTE — Telephone Encounter (Signed)
Pt calling back and is saying that they were told they do need a prescription in order to purchase, please advise. She wanted to know if she could pick up a prescription for it tomorrow

## 2020-04-08 NOTE — Telephone Encounter (Signed)
Pt called and made an appointment for tomorrow 04/09/2020 virtually and RX is printed and signed for Oxygen shoulder bag-will inform pt at visit tomorrow.

## 2020-04-08 NOTE — Telephone Encounter (Signed)
LVM for [name] to call office. Need to have conference call or video appt with him and [name].

## 2020-04-08 NOTE — Telephone Encounter (Signed)
LVM to call office. rx printed and signed

## 2020-04-08 NOTE — Telephone Encounter (Signed)
[name] please advise.  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 She asked the pt if she felt unsafe and she said she doesn't feel unsafe but just felt like son needs helps and has problems. [name] said she didn't know what the protocol was for this or if a Education officer, museum needed to be involved.

## 2020-04-09 ENCOUNTER — Encounter: Payer: Medicare Other | Admitting: Nurse Practitioner

## 2020-04-09 ENCOUNTER — Encounter: Payer: Self-pay | Admitting: Nurse Practitioner

## 2020-04-09 ENCOUNTER — Other Ambulatory Visit: Payer: Self-pay

## 2020-04-09 ENCOUNTER — Telehealth: Payer: Self-pay

## 2020-04-09 DIAGNOSIS — I5042 Chronic combined systolic (congestive) and diastolic (congestive) heart failure: Secondary | ICD-10-CM

## 2020-04-09 DIAGNOSIS — R0902 Hypoxemia: Secondary | ICD-10-CM

## 2020-04-09 NOTE — Telephone Encounter (Signed)
That is the number I called twice earlier. I will not be able to return their call today. Please schedule a video appt for Monday.
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
## 2020-04-09 NOTE — Telephone Encounter (Signed)
Left message for pt to call back and make an appointment, will all back.

## 2020-04-12 NOTE — Progress Notes (Signed)
No Show This encounter was created in error - please disregard. 

## 2020-04-13 ENCOUNTER — Telehealth: Payer: Self-pay

## 2020-04-13 NOTE — Telephone Encounter (Signed)
Patient son called back and stated that he can't make an appointment until patient has a portable oxygen tank. Son asked if they can use [facility] on [address] or [facility] in [place], please advise. [phone]

## 2020-04-13 NOTE — Addendum Note (Signed)
Addended by: Leana Gamer on: 04/13/2020 04:29 PM   Modules accepted: Orders

## 2020-04-13 NOTE — Telephone Encounter (Signed)
Left voicemail letting the son know to call the office and schedule an appointment when he can be present with pt and that we have a prescription printed for the bag for portable oxygen tank an to also let us know if there is a pharmacy or supply store he would like [name] to fax this to.

## 2020-04-13 NOTE — Telephone Encounter (Signed)
Rx for portable oxygen tank was faxed over to [facility] @ [phone] along with the last nursing home note.

## 2020-04-13 NOTE — Telephone Encounter (Signed)
Pt was notified and wanted to fax Rx for portable oxygen bag to [facility] on [address]. Rx was faxed.  [name] please advise. Pt did say he was going to call back an make an appointment, he thinks pt is having horrible panic attacks and may need medication.

## 2020-04-13 NOTE — Telephone Encounter (Signed)
Received message from after hours service 04/12/20 6:37pm: "Caller states he is having problems getting a portable oxygen unit."

## 2020-04-13 NOTE — Telephone Encounter (Signed)
[name] stated Ms. Dohmen does not have a portable oxygen concerntrator. Once was delivered 02/24/2020, but had to be returned due to malfunction. He was informed that this will be replaced, but it wasn't. He initially called requesting for a bag, but he was referring a portable oxygen concentrator. He has not reached out to [place] to inquire about new portable concentrator.  I called [facility] and talked to an agent whom confirmed that a [employer] portable concentrator was delivered 02/24/2020, but does not see any note which indicates, it was returned. She advised to leave a message with the Physiological scientist, so previous portable oxygen concentrator can be traced.  I asked if another order can be faxed to dispense a portable oxygen tank/cylinder? She stated, that an order can be faxed with the original nursing home assessment.  I informed [name] and Ms. Kaczmarek about above plan and my conversation with [place]. I advised him to call the office if he does not hear from [facility] by Thursday. He verbalized understanding.

## 2020-04-14 NOTE — Telephone Encounter (Signed)
error 

## 2020-04-15 ENCOUNTER — Other Ambulatory Visit: Payer: Self-pay | Admitting: *Deleted

## 2020-04-15 ENCOUNTER — Telehealth: Payer: Self-pay | Admitting: Nurse Practitioner

## 2020-04-15 NOTE — Telephone Encounter (Signed)
Only think I will recommend at this time is alprazolam till her appt in office If another medication is needed, she needs to schedule a video appt to discuss this. Telephone appts are not appropriate.

## 2020-04-15 NOTE — Patient Outreach (Signed)
[facility]) Care Management  04/15/2020  [name] Feb 03, 1927 [id number]   Telephone Assessment-Successful  RN spoke with pt today and inquired on an update on pt's ongoing management of care. Pt reports she does not weight with the last weight at 113 lbs. RN stress the importance of daily weights in order to monitor her HF related to fluid retention. Pt verbalized an understanding but states she will not weight daily. RN inquired if she would at least attempt a weekly weight and educated on the possible risk with symptoms that may appear and what to do if acute. Pt has agreed to weight weekly after emptying her bladder and document these weight in the [place]. Care plan discussed along with goals and interventions adjusted accordingly. Will continue to educate on HF and the risk if not monitored. Case plan updated accordingly.  Other issues pt is address related to a smaller O2 tank. States she is working with provider and [employer] on obtaining a more smaller yet portable tank. RN strongly encouraged pt to use the available O2 tanks to prevent acute SOB or breathing issues while she awaits the smaller tanks. Pt verbalized an understanding and aware of the importance of oxygenation.  Plan: Will follow up next month with ongoing management of care and re-evaluate pt;s adhere with her weights and management of care.  [facility] CM Care Plan Problem One     Most Recent Value  Care Plan Problem One Deficient Knowledge related to CHF unfamiliarity with information/education  Role Documenting the Problem One Care Management Telephonic Coordinator  Care Plan for Problem One Active  THN Long Term Goal  Pt/Caregiver will verbalize a plan of action in the YELLOW zone within the next 90 days.  THN Long Term Goal Start Date 03/01/20  THN CM Short Term Goal #1  Caregiver/pt will verbalize maintaining weight within the 2 lbs of goal within the next 30 days.  THN CM Short Term Goal  #1 Start Date 03/01/20  Interventions for Short Term Goal #1 Will continue to educate and stress the importance to adhere to weights to monitor fluid retention and prevent acute events. Will set regimen on pt's participation with her HF monitoring with weekly weights since pt decline to weigh daily. Encouraged to doucment in [place] for providers to view.  THN CM Short Term Goal #2  Pt will keep all medical appointments scheduled within the next 30 days.  THN CM Short Term Goal #2 Start Date 03/01/20  [facility] CM Short Term Goal #2 Met Date 04/15/20      Raina Mina, RN Care Management Coordinator [place] Office [phone]

## 2020-04-15 NOTE — Telephone Encounter (Signed)
[name] please advise.  Pt called and asking for something for her panic attacks.

## 2020-04-15 NOTE — Telephone Encounter (Signed)
Patient is calling to see if she can have something sent in for her panic attacks. She said that she had one this morning. If approved, please send to [facility] in [facility] and call patient at [phone] to let her know that it has been sent in.  Thank you, Burley Saver

## 2020-04-15 NOTE — Telephone Encounter (Signed)
[name]  Pt's son was notified and he understood he said he feels like they can make it to the appointment next Thursday without changes to medication. I also asked pt if they got the oxygen tank and he in fact said they got one. He said it did look like a deep sea tank but they did receive it.

## 2020-04-21 ENCOUNTER — Ambulatory Visit (INDEPENDENT_AMBULATORY_CARE_PROVIDER_SITE_OTHER): Payer: Medicare Other

## 2020-04-21 ENCOUNTER — Other Ambulatory Visit: Payer: Self-pay

## 2020-04-21 ENCOUNTER — Ambulatory Visit (INDEPENDENT_AMBULATORY_CARE_PROVIDER_SITE_OTHER): Payer: Medicare Other | Admitting: Nurse Practitioner

## 2020-04-21 ENCOUNTER — Encounter: Payer: Self-pay | Admitting: Nurse Practitioner

## 2020-04-21 VITALS — BP 130/82 | HR 65 | Temp 96.4°F | Ht 60.0 in | Wt 116.4 lb

## 2020-04-21 DIAGNOSIS — E039 Hypothyroidism, unspecified: Secondary | ICD-10-CM | POA: Diagnosis not present

## 2020-04-21 DIAGNOSIS — I5042 Chronic combined systolic (congestive) and diastolic (congestive) heart failure: Secondary | ICD-10-CM | POA: Insufficient documentation

## 2020-04-21 DIAGNOSIS — N1832 Chronic kidney disease, stage 3b: Secondary | ICD-10-CM

## 2020-04-21 DIAGNOSIS — Z7189 Other specified counseling: Secondary | ICD-10-CM | POA: Diagnosis not present

## 2020-04-21 DIAGNOSIS — F411 Generalized anxiety disorder: Secondary | ICD-10-CM

## 2020-04-21 DIAGNOSIS — I700 Atherosclerosis of aorta: Secondary | ICD-10-CM

## 2020-04-21 DIAGNOSIS — R0902 Hypoxemia: Secondary | ICD-10-CM

## 2020-04-21 LAB — CBC WITH DIFFERENTIAL/PLATELET
Basophils Absolute: 0.2 10*3/uL — ABNORMAL HIGH (ref 0.0–0.1)
Basophils Relative: 1.4 % (ref 0.0–3.0)
Eosinophils Absolute: 0.3 10*3/uL (ref 0.0–0.7)
Eosinophils Relative: 2.1 % (ref 0.0–5.0)
HCT: 28.6 % — ABNORMAL LOW (ref 36.0–46.0)
Hemoglobin: 9.2 g/dL — ABNORMAL LOW (ref 12.0–15.0)
Lymphocytes Relative: 30.5 % (ref 12.0–46.0)
Lymphs Abs: 3.7 10*3/uL (ref 0.7–4.0)
MCHC: 32.1 g/dL (ref 30.0–36.0)
MCV: 84.2 fl (ref 78.0–100.0)
Monocytes Absolute: 0.9 10*3/uL (ref 0.1–1.0)
Monocytes Relative: 7.6 % (ref 3.0–12.0)
Neutro Abs: 7.2 10*3/uL (ref 1.4–7.7)
Neutrophils Relative %: 58.4 % (ref 43.0–77.0)
Platelets: 348 10*3/uL (ref 150.0–400.0)
RBC: 3.4 Mil/uL — ABNORMAL LOW (ref 3.87–5.11)
RDW: 18.1 % — ABNORMAL HIGH (ref 11.5–15.5)
WBC: 12.3 10*3/uL — ABNORMAL HIGH (ref 4.0–10.5)

## 2020-04-21 LAB — BASIC METABOLIC PANEL
BUN: 16 mg/dL (ref 6–23)
CO2: 24 mEq/L (ref 19–32)
Calcium: 8.7 mg/dL (ref 8.4–10.5)
Chloride: 101 mEq/L (ref 96–112)
Creatinine, Ser: 0.96 mg/dL (ref 0.40–1.20)
GFR: 54.5 mL/min — ABNORMAL LOW (ref >60.00)
Glucose, Bld: 94 mg/dL (ref 70–99)
Potassium: 3.3 mEq/L — ABNORMAL LOW (ref 3.5–5.1)
Sodium: 138 mEq/L (ref 135–145)

## 2020-04-21 LAB — TSH: TSH: 2.75 u[IU]/mL (ref 0.35–4.50)

## 2020-04-21 LAB — T4, FREE: Free T4: 1.09 ng/dL (ref 0.60–1.60)

## 2020-04-21 MED ORDER — ALPRAZOLAM 0.25 MG PO TABS: 0.2500 mg | ORAL_TABLET | Freq: Every evening | ORAL | 1 refills | Status: DC | PRN

## 2020-04-21 MED ORDER — DULOXETINE HCL 20 MG PO CPEP: 20.0000 mg | ORAL_CAPSULE | Freq: Every day | ORAL | 5 refills | Status: DC

## 2020-04-21 NOTE — Assessment & Plan Note (Signed)
Worsening mood Agreed to start cymbalta 20mg  at hs Maintain alprazolam dose at 0.25mg  dailyprn. F/up in 61month

## 2020-04-21 NOTE — Progress Notes (Signed)
Subjective:  Patient ID: [name], female    DOB: 1927-09-08  Age: 86 y.o. MRN: [id number]  CC: Follow-up (oxygen/leg swelling//panic attacks-left leg swells more than right more swelling comes and goes for some time now but last week or so it has swole more//pt said o2 tank still isn't right its a big an not portable(in car today)//pt son mentioned her having panic attacks more and getting upset//no covid vaccinations)  HPI Accompanied by her son-[name]. Anxiety: Reports increase panic attacks since last hospitalization No improvement with alprazolam BID. PMP database indicates it was last filled 01/2020  CHF: Chronic SOB with exertion, hypoxia and worsening LE edema. Declined to schedule appt with cardiology because she does not want any invasive procedure. No diuretic use at this time. BP Readings from Last 3 Encounters:  04/21/20 130/82  02/25/20 134/90  02/17/20 131/74   Wt Readings from Last 3 Encounters:  04/21/20 116 lb 6.4 oz (52.8 kg)  04/09/20 113 lb (51.3 kg)  02/25/20 128 lb 11.2 oz (58.4 kg)   Reviewed past Medical, Social and Family history today.  Outpatient Medications Prior to Visit  Medication Sig Dispense Refill  . acetaminophen (TYLENOL) 500 MG tablet Take 1,000 mg by mouth every 8 (eight) hours as needed (joint pain).     Marland Kitchen aspirin 81 MG chewable tablet Chew 1 tablet (81 mg total) by mouth daily. 30 tablet 0  . ferrous sulfate 325 (65 FE) MG tablet Take 325 mg by mouth. With a meal    . gabapentin (NEURONTIN) 100 MG capsule Take 1 capsule (100 mg total) by mouth at bedtime. 30 capsule 0  . metoprolol succinate (TOPROL-XL) 25 MG 24 hr tablet Take 3 tablets (75 mg total) by mouth daily. 90 tablet 0  . Multiple Vitamin (MULTIVITAMIN WITH MINERALS) TABS tablet Take 1 tablet by mouth daily. 30 tablet 0  . Olopatadine HCl 0.2 % SOLN Place 1 drop into both eyes daily. 2.5 mL 0  . ALPRAZolam (XANAX) 0.25 MG tablet Take 1 tablet (0.25 mg total) by mouth  2 (two) times daily. FOR ANXIETY/RESTLESS LEGS 60 tablet 0  . levothyroxine (SYNTHROID) 75 MCG tablet TAKE 1 TABLET BY MOUTH ONCE DAILY BEFORE BREAKFAST 30 tablet 0   No facility-administered medications prior to visit.    ROS See HPI  Objective:  BP 130/82   Pulse 65   Temp (!) 96.4 F (35.8 C) (Tympanic)   Ht 5' (1.524 m)   Wt 116 lb 6.4 oz (52.8 kg)   SpO2 91% Comment: O2 4L [place]  BMI 22.73 kg/m   BP Readings from Last 3 Encounters:  04/21/20 130/82  02/25/20 134/90  02/17/20 131/74   Wt Readings from Last 3 Encounters:  04/21/20 116 lb 6.4 oz (52.8 kg)  04/09/20 113 lb (51.3 kg)  02/25/20 128 lb 11.2 oz (58.4 kg)   Physical Exam Vitals reviewed.  Cardiovascular:     Rate and Rhythm: Rhythm irregular.     Pulses: Normal pulses.     Heart sounds: Murmur heard.   Pulmonary:     Effort: Pulmonary effort is normal.     Breath sounds: No stridor. No wheezing, rhonchi or rales.  Musculoskeletal:     Right lower leg: Edema present.     Left lower leg: Edema present.  Skin:    Findings: No erythema.  Neurological:     Mental Status: She is alert and oriented to person, place, and time.     Lab Results  Component Value  Date   WBC 12.3 (H) 04/21/2020   HGB 9.2 Repeated and verified X2. (L) 04/21/2020   HCT 28.6 (L) 04/21/2020   PLT 348.0 04/21/2020   GLUCOSE 94 04/21/2020   CHOL 155 07/03/2019   TRIG 143 07/03/2019   HDL 51 07/03/2019   LDLDIRECT 146.0 07/29/2015   LDLCALC 80 07/03/2019   ALT 15 02/10/2020   AST 24 02/10/2020   NA 138 04/21/2020   K 3.3 (L) 04/21/2020   CL 101 04/21/2020   CREATININE 0.96 04/21/2020   BUN 16 04/21/2020   CO2 24 04/21/2020   TSH 2.75 04/21/2020   INR 1.1 08/21/2019   HGBA1C 5.8 (H) 07/03/2019   DG Chest Port 1 View  Result Date: 02/10/2020 CLINICAL DATA:  Shortness of breath this morning EXAM: PORTABLE CHEST 1 VIEW COMPARISON:  09/12/2019 FINDINGS: Chronic enlarged cardiopericardial size accentuated by a large hiatal  hernia. There is no edema, consolidation, effusion, or pneumothorax. Lung volumes are low IMPRESSION: 1. Cardiomegaly without failure. 2. Hiatal hernia. Electronically Signed   By: Monte Fantasia M.D.   On: 02/10/2020 06:43    Assessment & Plan:  This visit occurred during the SARS-CoV-2 public health emergency.  Safety protocols were in place, including screening questions prior to the visit, additional usage of staff PPE, and extensive cleaning of exam room while observing appropriate contact time as indicated for disinfecting solutions.   Liala was seen today for follow-up.  Diagnoses and all orders for this visit:  Hypoxia -     Amb Referral to Palliative Care -     CBC w/Diff -     DG Chest 2 View -     For home use only DME oxygen  Aortic atherosclerosis (HCC) -     Amb Referral to Palliative Care  Goals of care, counseling/discussion -     Amb Referral to Palliative Care  Stage 3b chronic kidney disease -     Amb Referral to Palliative Care -     For home use only DME oxygen -     Basic metabolic panel  Chronic combined systolic and diastolic congestive heart failure (HCC) -     Amb Referral to Palliative Care -     For home use only DME oxygen -     Basic metabolic panel -     furosemide (LASIX) 20 MG tablet; Take 1 tablet (20 mg total) by mouth daily. -     potassium chloride SA (KLOR-CON) 20 MEQ tablet; Take 2 tablets (40 mEq total) by mouth 2 (two) times daily.  Hypothyroidism, unspecified type -     TSH -     T4, free -     levothyroxine (SYNTHROID) 75 MCG tablet; TAKE 1 TABLET BY MOUTH ONCE DAILY BEFORE BREAKFAST  GAD (generalized anxiety disorder) -     DULoxetine (CYMBALTA) 20 MG capsule; Take 1 capsule (20 mg total) by mouth daily. -     ALPRAZolam (XANAX) 0.25 MG tablet; Take 1 tablet (0.25 mg total) by mouth at bedtime as needed for anxiety. FOR ANXIETY/RESTLESS LEGS   I have changed Deloria Lair. Haney's ALPRAZolam. I am also having her start on DULoxetine,  furosemide, and potassium chloride SA. Additionally, I am having her maintain her acetaminophen, multivitamin with minerals, aspirin, ferrous sulfate, gabapentin, metoprolol succinate, Olopatadine HCl, and levothyroxine.  Meds ordered this encounter  Medications  . DULoxetine (CYMBALTA) 20 MG capsule    Sig: Take 1 capsule (20 mg total) by mouth daily.  Dispense:  30 capsule    Refill:  5    Order Specific Question:   Supervising Provider    Answer:   Ronnald Nian [[id number]]  . ALPRAZolam (XANAX) 0.25 MG tablet    Sig: Take 1 tablet (0.25 mg total) by mouth at bedtime as needed for anxiety. FOR ANXIETY/RESTLESS LEGS    Dispense:  30 tablet    Refill:  1    Order Specific Question:   Supervising Provider    Answer:   Ronnald Nian [[id number]]  . furosemide (LASIX) 20 MG tablet    Sig: Take 1 tablet (20 mg total) by mouth daily.    Dispense:  30 tablet    Refill:  0    Order Specific Question:   Supervising Provider    Answer:   Ronnald Nian [[id number]]  . potassium chloride SA (KLOR-CON) 20 MEQ tablet    Sig: Take 2 tablets (40 mEq total) by mouth 2 (two) times daily.    Dispense:  60 tablet    Refill:  0    Order Specific Question:   Supervising Provider    Answer:   Ronnald Nian [[id number]]  . levothyroxine (SYNTHROID) 75 MCG tablet    Sig: TAKE 1 TABLET BY MOUTH ONCE DAILY BEFORE BREAKFAST    Dispense:  90 tablet    Refill:  1    Order Specific Question:   Supervising Provider    Answer:   Ronnald Nian [[id number]]    Problem List Items Addressed This Visit      Cardiovascular and Mediastinum   Aortic atherosclerosis ([place])   Relevant Medications   furosemide (LASIX) 20 MG tablet   Other Relevant Orders   Amb Referral to Palliative Care   Chronic combined systolic and diastolic congestive heart failure ([place])    Note from 08/2019 OV: Echocardiogram done 09/05/2019: 1. Left ventricular ejection fraction, by visual estimation, is 35 to 40%. The left  ventricle has moderately decreased function. There is mildly increased left ventricular hypertrophy. 2. Mid and apical anterior wall, mid and apical anterior septum, and apex are abnormal. 3. No apical thrombus seen 4. Definity contrast agent was given IV to delineate the left ventricular endocardial borders. Clear lungs and no LE edema Stable Vital signs. Reports compliance with medications. Repeat CXR today: cardiomegaly, no pleural effusion. Advised Ms. Fiala and [name] to schedule f/up with cardiology ASAP. Repeat BMP indicates worsening renal function, so decrease furosemide to 20mg . Repeat BMP in 2weeks. Maintain metoprolol XL and irbesartan.  Clear lung sounds today, but bilateral LE edema. No apparent respiratory distress. She does not wish to schedule appt with cardiology Repeat CXR and labs today       Relevant Medications   furosemide (LASIX) 20 MG tablet   potassium chloride SA (KLOR-CON) 20 MEQ tablet   Other Relevant Orders   Amb Referral to Palliative Care   For home use only DME oxygen   Basic metabolic panel (Completed)     Respiratory   Hypoxia - Primary   Relevant Orders   Amb Referral to Palliative Care   CBC w/Diff (Completed)   DG Chest 2 View (Completed)   For home use only DME oxygen     Endocrine   Hypothyroid   Relevant Medications   levothyroxine (SYNTHROID) 75 MCG tablet   Other Relevant Orders   TSH (Completed)   T4, free (Completed)     Genitourinary   Chronic kidney disease, stage 3 unspecified   Relevant  Orders   Amb Referral to Palliative Care   For home use only DME oxygen   Basic metabolic panel (Completed)     Other   Anxiety    Worsening mood Agreed to start cymbalta 20mg  at hs Maintain alprazolam dose at 0.25mg  dailyprn. F/up in 4month      Relevant Medications   DULoxetine (CYMBALTA) 20 MG capsule   ALPRAZolam (XANAX) 0.25 MG tablet   Goals of care, counseling/discussion    She wishes to continue managing  symptoms with medications, but does not want any invasive procedures. Declined appt with cardiology or urology or gastroenterology or GYN. Decline home health services (nurse aid, nurse, PT or OT) Agreed to palliative care services.      Relevant Orders   Amb Referral to Palliative Care      Follow-up: Return in about 4 weeks (around 05/19/2020) for anxiety (21mins).  Wilfred Lacy, NP

## 2020-04-21 NOTE — Assessment & Plan Note (Signed)
She wishes to continue managing symptoms with medications, but does not want any invasive procedures. Declined appt with cardiology or urology or gastroenterology or GYN. Decline home health services (nurse aid, nurse, PT or OT) Agreed to palliative care services.

## 2020-04-21 NOTE — Assessment & Plan Note (Signed)
Note from 08/2019 OV: Echocardiogram done 09/05/2019: 1. Left ventricular ejection fraction, by visual estimation, is 35 to 40%. The left ventricle has moderately decreased function. There is mildly increased left ventricular hypertrophy. 2. Mid and apical anterior wall, mid and apical anterior septum, and apex are abnormal. 3. No apical thrombus seen 4. Definity contrast agent was given IV to delineate the left ventricular endocardial borders. Clear lungs and no LE edema Stable Vital signs. Reports compliance with medications. Repeat CXR today: cardiomegaly, no pleural effusion. Advised [name] and [name] to schedule f/up with cardiology ASAP. Repeat BMP indicates worsening renal function, so decrease furosemide to 20mg . Repeat BMP in 2weeks. Maintain metoprolol XL and irbesartan.  Clear lung sounds today, but bilateral LE edema. No apparent respiratory distress. She does not wish to schedule appt with cardiology Repeat CXR and labs today

## 2020-04-21 NOTE — Patient Instructions (Addendum)
Start cymbalta at bedtime Maintain current xanax dose.  Stable thyroid and renal function, but low potassium Resume furosemide and potassium. New rx sent Stable cbc with persistent anemia and elevated WBC. F/up in 65month as discussed  I have send oxygen order to another company.  You will be contacted by palliative care services.

## 2020-04-22 ENCOUNTER — Ambulatory Visit: Payer: Medicare Other | Admitting: Nurse Practitioner

## 2020-04-23 ENCOUNTER — Telehealth: Payer: Self-pay | Admitting: Nurse Practitioner

## 2020-04-23 MED ORDER — LEVOTHYROXINE SODIUM 75 MCG PO TABS: ORAL_TABLET | ORAL | 1 refills | Status: DC

## 2020-04-23 MED ORDER — FUROSEMIDE 20 MG PO TABS: 20.0000 mg | ORAL_TABLET | Freq: Every day | ORAL | 0 refills | Status: DC

## 2020-04-23 MED ORDER — POTASSIUM CHLORIDE CRYS ER 20 MEQ PO TBCR: 40.0000 meq | EXTENDED_RELEASE_TABLET | Freq: Two times a day (BID) | ORAL | 0 refills | Status: DC

## 2020-04-23 NOTE — Telephone Encounter (Signed)
Patient's son received a "vague" text message from an [place], but he is unsure which company. He would like to know the status of her oxygen order.

## 2020-04-23 NOTE — Telephone Encounter (Signed)
Kristie please advise.  I'm not sure which company the oxygen is with [name] said you were handling it. I couldn't find anything in the chart, pt asking for status of oxygen, he said he received some sort of text but no company name just a call back number an the number was for [employer] it was [phone], I thought we wasn't using them anymore per pt and issues we had.

## 2020-04-26 ENCOUNTER — Telehealth: Payer: Self-pay | Admitting: Nurse Practitioner

## 2020-04-26 NOTE — Telephone Encounter (Signed)
[name] please advise.  Pt son called an was wondering why we was still dealing with [employer], He claims to have been put on a 9 min hold atleast 5x. He is requesting to have anyone else but them to help with the O2.

## 2020-04-26 NOTE — Telephone Encounter (Signed)
I was informed, that is the agency her insurance with accept. Please forward this to [facility]

## 2020-04-26 NOTE — Telephone Encounter (Signed)
Kristie per [place] she wanted me to forward this to you as well so you will have the [place]. So [employer] is the only company that pt's ins. Accepts?

## 2020-04-26 NOTE — Telephone Encounter (Signed)
Patient is requesting a small oxygen bag/tank. Something she can carry like a purse.

## 2020-04-26 NOTE — Telephone Encounter (Signed)
I understand that they have the O2 contract with the insurance.? It's confusing but I have forwarded [name] a message. I'm working with their DME team contacts and they assure me the pt is going to have an RT come out to evaluate her for the best portable system for her that meets the guidelines for insurance. They don't all cover the "newest" equipment.

## 2020-04-28 NOTE — Telephone Encounter (Signed)
Kristie please advise.  We have sent [employer] a prescription for this already I'm not sure if we need to do that again but pt still hasn't got a bag yet.

## 2020-04-29 NOTE — Telephone Encounter (Signed)
[name] FYI-Pt has a respiratory therapy consult scheduled for today with [employer] to eval for portable o2 system.

## 2020-04-29 NOTE — Telephone Encounter (Signed)
Pt has a respiratory therapy consult today with [employer] to eval the portable system she has and what is appropriate for her & within coverage guidelines for her insurance.

## 2020-04-29 NOTE — Telephone Encounter (Signed)
See other tel notes - [facility] has competitive bidding in place for DME, O2, etc and there are certain companies contracted to provide services.

## 2020-05-07 ENCOUNTER — Telehealth: Payer: Self-pay | Admitting: Nurse Practitioner

## 2020-05-07 NOTE — Telephone Encounter (Signed)
[name] please advise.  Pt's son called asking if his mom could get a refill for Xanax. He states that at her last appointment it was changed from 1 daily to 2 but the new Rx didn't reflect the change and he states she will run out soon.  Alprazolam (Xanax) 30 tabs   1-refills Last filled-04/21/20 Last OV-04/21/20

## 2020-05-07 NOTE — Telephone Encounter (Signed)
Patient's son called to say she was previously taking 1 Xanax daily and at her last appt, [name] told her to take two per day. However, the last refill sent in was only for 30 pills with same QD sig. Patient will be needing a refill soon.

## 2020-05-09 NOTE — Telephone Encounter (Signed)
No instruction was given to increase dose. Dose was maintained and she was to start cymbalta.

## 2020-05-10 NOTE — Telephone Encounter (Signed)
Pt/ pt son was called and message was left to return call to review medications and how they are to be taken

## 2020-05-11 ENCOUNTER — Telehealth: Payer: Self-pay | Admitting: Nurse Practitioner

## 2020-05-11 NOTE — Telephone Encounter (Signed)
Pt son was contacted and he verbally understood that there was not increase in the Xanax and that Cymbalta was added.

## 2020-05-11 NOTE — Telephone Encounter (Signed)
Patient is calling and wanted to speak to someone regarding medication. CB is [phone]

## 2020-05-11 NOTE — Telephone Encounter (Signed)
Pt son was contacted and verbally understand there was no increase in Xanax but Cymbalta was added.

## 2020-05-18 ENCOUNTER — Other Ambulatory Visit: Payer: Self-pay | Admitting: *Deleted

## 2020-05-18 NOTE — Patient Outreach (Addendum)
[facility]) Care Management  05/18/2020  [name] 09/26/1927 [id number]    Telephone Assessment-CHF  RN spoke with DPR today son [name] who provided an update on p'ts ongoing management of care. Reports pt was started on Lasix a few weeks ago for some swelling to her LE. RN discussed the last conversation with pt to weight weekly if not daily. Son states "they keep look out" for any swelling. RN reviewed all medications and explained how Lasix reduces fluid retention. Son denies any swelling as pt is in the GREEN zone today with no retention of fluids to her LE or difficulty breathing. Reports pt's weights are around 113-114 lbs. States pt hs been weighing several times a week in attempts to manage her HF. Discussed the plan of care and pt's responsibility for managing her care along with the caregiver support to reduce acute symptoms from occurring by closely monitoring her fluids, low sodium diet and taking all her medications. In addition to attending all medical appointments to prevention acute issues from occurring with earlier interventions. RN strongly encouraged the caregiver reiterate on the plan discussed for pt's participation.  Plan: Will update the care plan and follow up next month with ongoing care management services or assisting this pt with her ongoing CHF. Will continue to update pt's provider accordingly.   Goals Addressed            This Visit's Progress   . stay healthy       CARE PLAN ENTRY (see longtitudinal plan of care for additional care plan information)   Current Barriers:  Marland Kitchen Knowledge deficit related to basic heart failure pathophysiology and self care management . Knowledge Deficits related to heart failure medications  Case Manager Clinical Goal(s):  Marland Kitchen Over the next 90 days, patient will verbalize understanding of Heart Failure Action Plan and when to call doctor . Over the next 30 days, patient will take all Heart Failure  mediations as prescribed . Over the next 30 days, patient will weigh daily and record (notifying MD of 3 lb weight gain over night or 5 lb in a week)  Interventions:  . Basic overview and discussion of pathophysiology of Heart Failure reviewed  . Discussed importance of daily weight and advised patient to weigh and record daily . Reviewed role of diuretics in prevention of fluid overload and management of heart failure  Patient Self Care Activities:  . Takes Heart Failure Medications as prescribed . Weighs daily and record (notifying MD of 3 lb weight gain over night or 5 lb in a week) . Verbalizes understanding of and follows CHF Action Plan  Initial goal documentation        Raina Mina, RN Care Management Coordinator [place] Office [phone]

## 2020-05-19 ENCOUNTER — Other Ambulatory Visit: Payer: Self-pay

## 2020-05-20 ENCOUNTER — Ambulatory Visit (INDEPENDENT_AMBULATORY_CARE_PROVIDER_SITE_OTHER): Payer: Medicare Other | Admitting: Nurse Practitioner

## 2020-05-20 ENCOUNTER — Encounter: Payer: Self-pay | Admitting: Nurse Practitioner

## 2020-05-20 VITALS — BP 128/80 | HR 70 | Temp 96.5°F | Ht 60.0 in | Wt 111.2 lb

## 2020-05-20 DIAGNOSIS — F411 Generalized anxiety disorder: Secondary | ICD-10-CM | POA: Insufficient documentation

## 2020-05-20 DIAGNOSIS — I5042 Chronic combined systolic (congestive) and diastolic (congestive) heart failure: Secondary | ICD-10-CM

## 2020-05-20 HISTORY — DX: Generalized anxiety disorder: F41.1

## 2020-05-20 MED ORDER — FUROSEMIDE 20 MG PO TABS: 20.0000 mg | ORAL_TABLET | Freq: Every day | ORAL | 5 refills | Status: DC | PRN

## 2020-05-20 MED ORDER — ALPRAZOLAM 0.25 MG PO TABS: 0.2500 mg | ORAL_TABLET | Freq: Every evening | ORAL | 2 refills | Status: DC | PRN

## 2020-05-20 MED ORDER — POTASSIUM CHLORIDE CRYS ER 20 MEQ PO TBCR: 20.0000 meq | EXTENDED_RELEASE_TABLET | Freq: Every day | ORAL | 5 refills | Status: DC | PRN

## 2020-05-20 NOTE — Progress Notes (Signed)
Subjective:  Patient ID: [name], female    DOB: 1927/08/01  Age: 86 y.o. MRN: [id number]  CC: Follow-up (4 week f/up for anxiety-duloxentine-pt hasn't started Cymbalta yet shes scared wants to talk to you before starting it-no covid vaccines)  HPI  Accompanied by her son-[name]  Chronic combined systolic and diastolic congestive heart failure (HCC) Improved LE edema and clear lung sounds. weight is down by 5lbs. Normal BP and HR BP Readings from Last 3 Encounters:  05/20/20 128/80  04/21/20 130/82  02/25/20 134/90   Metoprolol and irbesartan discontinued by patient due to fear of side effects. With CKD-3, advised to use furosemide and potassium prn, for LE edema and weight gain >5lbs in 3days. Daily weight check recommended F/up in 30months  Anxiety Did not start cymbalta due to fear of possible side effects I encouraged [name] to take cymbalta with food in order to avoid reliance on higher doses of alprazolam, which only provides short term relief. Advised her of possible side effects (nausea and increase sedation). She is to take medication with food and/or at bedtime if causes somnolence.  She agreed to start medication and maintain alprazolam at current dose (refill sent). F/up in 41months or sooner if experiences any adverse side effects    Reviewed past Medical, Social and Family history today.  Outpatient Medications Prior to Visit  Medication Sig Dispense Refill  . acetaminophen (TYLENOL) 500 MG tablet Take 1,000 mg by mouth every 8 (eight) hours as needed (joint pain).     Marland Kitchen aspirin 81 MG chewable tablet Chew 1 tablet (81 mg total) by mouth daily. 30 tablet 0  . ferrous sulfate 325 (65 FE) MG tablet Take 325 mg by mouth. With a meal    . levothyroxine (SYNTHROID) 75 MCG tablet TAKE 1 TABLET BY MOUTH ONCE DAILY BEFORE BREAKFAST 90 tablet 1  . Multiple Vitamin (MULTIVITAMIN WITH MINERALS) TABS tablet Take 1 tablet by mouth daily. 30 tablet 0  .  Olopatadine HCl 0.2 % SOLN Place 1 drop into both eyes daily. 2.5 mL 0  . ALPRAZolam (XANAX) 0.25 MG tablet Take 1 tablet (0.25 mg total) by mouth at bedtime as needed for anxiety. FOR ANXIETY/RESTLESS LEGS 30 tablet 1  . furosemide (LASIX) 20 MG tablet Take 1 tablet (20 mg total) by mouth daily. 30 tablet 0  . gabapentin (NEURONTIN) 100 MG capsule Take 1 capsule (100 mg total) by mouth at bedtime. 30 capsule 0  . metoprolol succinate (TOPROL-XL) 25 MG 24 hr tablet Take 3 tablets (75 mg total) by mouth daily. 90 tablet 0  . potassium chloride SA (KLOR-CON) 20 MEQ tablet Take 2 tablets (40 mEq total) by mouth 2 (two) times daily. 60 tablet 0  . DULoxetine (CYMBALTA) 20 MG capsule Take 1 capsule (20 mg total) by mouth daily. (Patient not taking: Reported on 05/20/2020) 30 capsule 5   No facility-administered medications prior to visit.    ROS See HPI  Objective:  BP 128/80   Pulse 70   Temp (!) 96.5 F (35.8 C) (Tympanic)   Ht 5' (1.524 m)   Wt 111 lb 3.2 oz (50.4 kg)   SpO2 100%   BMI 21.72 kg/m   Physical Exam Cardiovascular:     Rate and Rhythm: Normal rate. Rhythm irregular.     Pulses: Normal pulses.     Heart sounds: Normal heart sounds.  Pulmonary:     Effort: Pulmonary effort is normal.     Breath sounds: Normal breath sounds.  Abdominal:     Palpations: Abdomen is soft.     Tenderness: There is no guarding.  Musculoskeletal:     Right lower leg: Edema present.     Left lower leg: Edema present.     Comments: Trace bilateral LE edema, non pitting  Skin:    Findings: Erythema present.  Neurological:     Mental Status: She is alert and oriented to person, place, and time.  Psychiatric:        Attention and Perception: Attention normal.        Mood and Affect: Mood is anxious.        Speech: Speech normal.        Thought Content: Thought content normal.        Cognition and Memory: She exhibits impaired recent memory. She does not exhibit impaired remote memory.     Assessment & Plan:  This visit occurred during the SARS-CoV-2 public health emergency.  Safety protocols were in place, including screening questions prior to the visit, additional usage of staff PPE, and extensive cleaning of exam room while observing appropriate contact time as indicated for disinfecting solutions.   [name] was seen today for follow-up.  Diagnoses and all orders for this visit:  GAD (generalized anxiety disorder) -     ALPRAZolam (XANAX) 0.25 MG tablet; Take 1 tablet (0.25 mg total) by mouth at bedtime as needed for anxiety. FOR ANXIETY/RESTLESS LEGS  Chronic combined systolic and diastolic congestive heart failure (HCC) -     furosemide (LASIX) 20 MG tablet; Take 1 tablet (20 mg total) by mouth daily as needed. -     potassium chloride SA (KLOR-CON) 20 MEQ tablet; Take 1 tablet (20 mEq total) by mouth daily as needed.    Problem List Items Addressed This Visit      Cardiovascular and Mediastinum   Chronic combined systolic and diastolic congestive heart failure (HCC)    Improved LE edema and clear lung sounds. weight is down by 5lbs. Normal BP and HR BP Readings from Last 3 Encounters:  05/20/20 128/80  04/21/20 130/82  02/25/20 134/90   Metoprolol and irbesartan discontinued by patient due to fear of side effects. With CKD-3, advised to use furosemide and potassium prn, for LE edema and weight gain >5lbs in 3days. Daily weight check recommended F/up in 56months      Relevant Medications   furosemide (LASIX) 20 MG tablet   potassium chloride SA (KLOR-CON) 20 MEQ tablet     Other   Anxiety - Primary    Did not start cymbalta due to fear of possible side effects I encouraged [name] to take cymbalta with food in order to avoid reliance on higher doses of alprazolam, which only provides short term relief. Advised her of possible side effects (nausea and increase sedation). She is to take medication with food and/or at bedtime if causes  somnolence.  She agreed to start medication and maintain alprazolam at current dose (refill sent). F/up in 37months or sooner if experiences any adverse side effects        Relevant Medications   ALPRAZolam (XANAX) 0.25 MG tablet      I have spent 7mins with this patient regarding history taking, documentation, formulating plan and discussing treatment options with patient.  Follow-up: Return in about 3 months (around 08/20/2020) for HTN, Hypothyroidism and CHF (53mins, repeat BMP, TSh and T4).  Wilfred Lacy, NP

## 2020-05-20 NOTE — Patient Instructions (Addendum)
Take furosemide and potassium if worsening LE edema and weight gain >5lbs in 3days. Monitor weight every morning before breakfast.  Start cymbalta, take with food Maintain xanax dose at 1tab ad bedtime.

## 2020-05-20 NOTE — Assessment & Plan Note (Addendum)
Did not start cymbalta due to fear of possible side effects I encouraged [name] to take cymbalta with food in order to avoid reliance on higher doses of alprazolam, which only provides short term relief. Advised her of possible side effects (nausea and increase sedation). She is to take medication with food and/or at bedtime if causes somnolence.  She agreed to start medication and maintain alprazolam at current dose (refill sent). F/up in 44months or sooner if experiences any adverse side effects

## 2020-05-20 NOTE — Assessment & Plan Note (Addendum)
Improved LE edema and clear lung sounds. weight is down by 5lbs. Normal BP and HR BP Readings from Last 3 Encounters:  05/20/20 128/80  04/21/20 130/82  02/25/20 134/90   Metoprolol and irbesartan discontinued by patient due to fear of side effects. With CKD-3, advised to use furosemide and potassium prn, for LE edema and weight gain >5lbs in 3days. Daily weight check recommended F/up in 73months

## 2020-05-31 ENCOUNTER — Telehealth: Payer: Self-pay | Admitting: Nurse Practitioner

## 2020-05-31 NOTE — Telephone Encounter (Signed)
Patient is calling to speak to the clinical staff. I asked if I could assist her, but she said that she needed to speak to [facility] or the nurse and did not give details for the call. Please give her a call back at [phone].  Thank you

## 2020-06-02 NOTE — Telephone Encounter (Signed)
She needs an urgent appt with urology or go to hospital.

## 2020-06-02 NOTE — Telephone Encounter (Signed)
[name] please advise.  Pt called stating she is having bladder problems. She stated it fills up with feces. When asked if she had a hard time urinating she said no but sometimes there is pain when she urinates. Pt also mentioned having issues back in November with a hospital stay. Pt was hard to follow. Pt has a urology appointment on 06/21/20, I mentioned this to her an calling them I even gave her the number and she said they were no help to her.   Call son [name] to see or does she need an appointment? She ask if she could schedule with you.

## 2020-06-02 NOTE — Telephone Encounter (Signed)
Pt was notified an verbally understood. 

## 2020-06-02 NOTE — Telephone Encounter (Signed)
Patient is calling back regarding previous message left, please advise. CB is [phone].

## 2020-06-15 ENCOUNTER — Telehealth: Payer: Self-pay

## 2020-06-15 NOTE — Telephone Encounter (Signed)
Left message for patient to call back. Please schedule Annual Medicare Wellness visit with Nurse Health Advisor.

## 2020-06-17 ENCOUNTER — Telehealth: Payer: Self-pay | Admitting: Nurse Practitioner

## 2020-06-17 NOTE — Telephone Encounter (Signed)
Patient has a UTI and son would like something sent in for her. He states that he does want to make a trip to the ED if this can be avoided. If approved, please send medication to [facility] on [address] and call [name] at [phone].

## 2020-06-17 NOTE — Telephone Encounter (Signed)
Please advise 

## 2020-06-18 ENCOUNTER — Inpatient Hospital Stay (HOSPITAL_COMMUNITY)
Admission: EM | Admit: 2020-06-18 | Discharge: 2020-06-21 | DRG: 871 | Disposition: A | Discharge status: Home / Self Care | Payer: Medicare Other | Attending: Internal Medicine | Admitting: Internal Medicine

## 2020-06-18 ENCOUNTER — Emergency Department (HOSPITAL_COMMUNITY): Payer: Medicare Other

## 2020-06-18 ENCOUNTER — Encounter (HOSPITAL_COMMUNITY): Payer: Self-pay

## 2020-06-18 ENCOUNTER — Other Ambulatory Visit: Payer: Self-pay

## 2020-06-18 DIAGNOSIS — I5042 Chronic combined systolic (congestive) and diastolic (congestive) heart failure: Secondary | ICD-10-CM | POA: Diagnosis present

## 2020-06-18 DIAGNOSIS — Z20822 Contact with and (suspected) exposure to covid-19: Secondary | ICD-10-CM | POA: Diagnosis present

## 2020-06-18 DIAGNOSIS — I4819 Other persistent atrial fibrillation: Secondary | ICD-10-CM | POA: Diagnosis present

## 2020-06-18 DIAGNOSIS — R52 Pain, unspecified: Secondary | ICD-10-CM | POA: Diagnosis not present

## 2020-06-18 DIAGNOSIS — Z9842 Cataract extraction status, left eye: Secondary | ICD-10-CM

## 2020-06-18 DIAGNOSIS — A4159 Other Gram-negative sepsis: Principal | ICD-10-CM | POA: Diagnosis present

## 2020-06-18 DIAGNOSIS — Z888 Allergy status to other drugs, medicaments and biological substances status: Secondary | ICD-10-CM

## 2020-06-18 DIAGNOSIS — F411 Generalized anxiety disorder: Secondary | ICD-10-CM | POA: Diagnosis present

## 2020-06-18 DIAGNOSIS — N136 Pyonephrosis: Secondary | ICD-10-CM | POA: Diagnosis present

## 2020-06-18 DIAGNOSIS — N838 Other noninflammatory disorders of ovary, fallopian tube and broad ligament: Secondary | ICD-10-CM | POA: Diagnosis not present

## 2020-06-18 DIAGNOSIS — Z87440 Personal history of urinary (tract) infections: Secondary | ICD-10-CM

## 2020-06-18 DIAGNOSIS — E785 Hyperlipidemia, unspecified: Secondary | ICD-10-CM | POA: Diagnosis present

## 2020-06-18 DIAGNOSIS — Z8249 Family history of ischemic heart disease and other diseases of the circulatory system: Secondary | ICD-10-CM

## 2020-06-18 DIAGNOSIS — Z87891 Personal history of nicotine dependence: Secondary | ICD-10-CM

## 2020-06-18 DIAGNOSIS — N131 Hydronephrosis with ureteral stricture, not elsewhere classified: Secondary | ICD-10-CM

## 2020-06-18 DIAGNOSIS — Z7982 Long term (current) use of aspirin: Secondary | ICD-10-CM

## 2020-06-18 DIAGNOSIS — I10 Essential (primary) hypertension: Secondary | ICD-10-CM | POA: Diagnosis present

## 2020-06-18 DIAGNOSIS — N321 Vesicointestinal fistula: Secondary | ICD-10-CM | POA: Diagnosis present

## 2020-06-18 DIAGNOSIS — N3000 Acute cystitis without hematuria: Secondary | ICD-10-CM

## 2020-06-18 DIAGNOSIS — Z79890 Hormone replacement therapy (postmenopausal): Secondary | ICD-10-CM

## 2020-06-18 DIAGNOSIS — M069 Rheumatoid arthritis, unspecified: Secondary | ICD-10-CM | POA: Diagnosis present

## 2020-06-18 DIAGNOSIS — N1330 Unspecified hydronephrosis: Secondary | ICD-10-CM | POA: Diagnosis not present

## 2020-06-18 DIAGNOSIS — E039 Hypothyroidism, unspecified: Secondary | ICD-10-CM | POA: Diagnosis present

## 2020-06-18 DIAGNOSIS — I350 Nonrheumatic aortic (valve) stenosis: Secondary | ICD-10-CM | POA: Diagnosis present

## 2020-06-18 DIAGNOSIS — Z880 Allergy status to penicillin: Secondary | ICD-10-CM | POA: Diagnosis not present

## 2020-06-18 DIAGNOSIS — G9341 Metabolic encephalopathy: Secondary | ICD-10-CM | POA: Diagnosis present

## 2020-06-18 DIAGNOSIS — A419 Sepsis, unspecified organism: Secondary | ICD-10-CM

## 2020-06-18 DIAGNOSIS — N179 Acute kidney failure, unspecified: Secondary | ICD-10-CM | POA: Diagnosis not present

## 2020-06-18 DIAGNOSIS — Z885 Allergy status to narcotic agent status: Secondary | ICD-10-CM

## 2020-06-18 DIAGNOSIS — N390 Urinary tract infection, site not specified: Secondary | ICD-10-CM | POA: Diagnosis present

## 2020-06-18 DIAGNOSIS — I110 Hypertensive heart disease with heart failure: Secondary | ICD-10-CM | POA: Diagnosis present

## 2020-06-18 DIAGNOSIS — Z8619 Personal history of other infectious and parasitic diseases: Secondary | ICD-10-CM

## 2020-06-18 DIAGNOSIS — Z79899 Other long term (current) drug therapy: Secondary | ICD-10-CM | POA: Diagnosis not present

## 2020-06-18 DIAGNOSIS — N83202 Unspecified ovarian cyst, left side: Secondary | ICD-10-CM | POA: Diagnosis present

## 2020-06-18 DIAGNOSIS — Z9841 Cataract extraction status, right eye: Secondary | ICD-10-CM | POA: Diagnosis not present

## 2020-06-18 DIAGNOSIS — I4891 Unspecified atrial fibrillation: Secondary | ICD-10-CM | POA: Diagnosis present

## 2020-06-18 DIAGNOSIS — R6520 Severe sepsis without septic shock: Secondary | ICD-10-CM

## 2020-06-18 DIAGNOSIS — R1084 Generalized abdominal pain: Secondary | ICD-10-CM | POA: Diagnosis not present

## 2020-06-18 DIAGNOSIS — Z825 Family history of asthma and other chronic lower respiratory diseases: Secondary | ICD-10-CM

## 2020-06-18 DIAGNOSIS — K439 Ventral hernia without obstruction or gangrene: Secondary | ICD-10-CM | POA: Diagnosis not present

## 2020-06-18 LAB — COMPREHENSIVE METABOLIC PANEL
ALT: 23 U/L (ref 0–44)
AST: 40 U/L (ref 15–41)
Albumin: 3.6 g/dL (ref 3.5–5.0)
Alkaline Phosphatase: 131 U/L — ABNORMAL HIGH (ref 38–126)
Anion gap: 9 (ref 5–15)
BUN: 50 mg/dL — ABNORMAL HIGH (ref 8–23)
CO2: 20 mmol/L — ABNORMAL LOW (ref 22–32)
Calcium: 8.7 mg/dL — ABNORMAL LOW (ref 8.9–10.3)
Chloride: 107 mmol/L (ref 98–111)
Creatinine, Ser: 2.56 mg/dL — ABNORMAL HIGH (ref 0.44–1.00)
GFR calc Af Amer: 18 mL/min — ABNORMAL LOW (ref >60)
GFR calc non Af Amer: 16 mL/min — ABNORMAL LOW (ref >60)
Glucose, Bld: 99 mg/dL (ref 70–99)
Potassium: 5.6 mmol/L — ABNORMAL HIGH (ref 3.5–5.1)
Sodium: 136 mmol/L (ref 135–145)
Total Bilirubin: 0.5 mg/dL (ref 0.3–1.2)
Total Protein: 6.9 g/dL (ref 6.5–8.1)

## 2020-06-18 LAB — CBC WITH DIFFERENTIAL/PLATELET
Abs Immature Granulocytes: 0.03 10*3/uL (ref 0.00–0.07)
Basophils Absolute: 0.1 10*3/uL (ref 0.0–0.1)
Basophils Relative: 1 %
Eosinophils Absolute: 0.5 10*3/uL (ref 0.0–0.5)
Eosinophils Relative: 5 %
HCT: 30.7 % — ABNORMAL LOW (ref 36.0–46.0)
Hemoglobin: 8.7 g/dL — ABNORMAL LOW (ref 12.0–15.0)
Immature Granulocytes: 0 %
Lymphocytes Relative: 19 %
Lymphs Abs: 1.9 10*3/uL (ref 0.7–4.0)
MCH: 22.8 pg — ABNORMAL LOW (ref 26.0–34.0)
MCHC: 28.3 g/dL — ABNORMAL LOW (ref 30.0–36.0)
MCV: 80.4 fL (ref 80.0–100.0)
Monocytes Absolute: 0.6 10*3/uL (ref 0.1–1.0)
Monocytes Relative: 6 %
Neutro Abs: 7 10*3/uL (ref 1.7–7.7)
Neutrophils Relative %: 69 %
Platelets: 323 10*3/uL (ref 150–400)
RBC: 3.82 MIL/uL — ABNORMAL LOW (ref 3.87–5.11)
RDW: 17.7 % — ABNORMAL HIGH (ref 11.5–15.5)
WBC: 10.1 10*3/uL (ref 4.0–10.5)
nRBC: 0 % (ref 0.0–0.2)

## 2020-06-18 LAB — URINALYSIS, ROUTINE W REFLEX MICROSCOPIC
Bilirubin Urine: NEGATIVE
Glucose, UA: NEGATIVE mg/dL
Ketones, ur: NEGATIVE mg/dL
Nitrite: POSITIVE — AB
Protein, ur: 100 mg/dL — AB
Specific Gravity, Urine: 1.014 (ref 1.005–1.030)
WBC, UA: >50 WBC/hpf — ABNORMAL HIGH (ref 0–5)
pH: 5 (ref 5.0–8.0)

## 2020-06-18 MED ORDER — SODIUM CHLORIDE 0.9 % IV SOLN: 1.0000 g | INTRAVENOUS | Status: DC

## 2020-06-18 MED ORDER — ASPIRIN 81 MG PO CHEW
81.0000 mg | CHEWABLE_TABLET | Freq: Every day | ORAL | Status: DC
  Administered 2020-06-19 – 2020-06-21 (×3): 81 mg via ORAL
  Filled 2020-06-18 (×3): qty 1

## 2020-06-18 MED ORDER — SODIUM CHLORIDE 0.9 % IV SOLN
1.0000 g | Freq: Two times a day (BID) | INTRAVENOUS | Status: DC
  Administered 2020-06-19 – 2020-06-20 (×4): 1 g via INTRAVENOUS
  Filled 2020-06-18 (×5): qty 1

## 2020-06-18 MED ORDER — OLOPATADINE HCL 0.1 % OP SOLN
1.0000 [drp] | Freq: Every day | OPHTHALMIC | Status: DC
  Administered 2020-06-19 – 2020-06-21 (×3): 1 [drp] via OPHTHALMIC
  Filled 2020-06-18: qty 5

## 2020-06-18 MED ORDER — GABAPENTIN 100 MG PO CAPS
100.0000 mg | ORAL_CAPSULE | Freq: Three times a day (TID) | ORAL | Status: DC
  Administered 2020-06-19 – 2020-06-20 (×5): 100 mg via ORAL
  Filled 2020-06-18 (×7): qty 1

## 2020-06-18 MED ORDER — ENOXAPARIN SODIUM 30 MG/0.3ML ~~LOC~~ SOLN
30.0000 mg | SUBCUTANEOUS | Status: DC
  Administered 2020-06-19 – 2020-06-21 (×3): 30 mg via SUBCUTANEOUS
  Filled 2020-06-18 (×3): qty 0.3

## 2020-06-18 MED ORDER — ADULT MULTIVITAMIN W/MINERALS CH
1.0000 | ORAL_TABLET | Freq: Every day | ORAL | Status: DC
  Administered 2020-06-19: 1 via ORAL
  Filled 2020-06-18 (×2): qty 1

## 2020-06-18 MED ORDER — ONDANSETRON HCL 4 MG/2ML IJ SOLN: 4.0000 mg | Freq: Four times a day (QID) | INTRAMUSCULAR | Status: DC | PRN

## 2020-06-18 MED ORDER — SODIUM CHLORIDE 0.9 % IV SOLN: INTRAVENOUS | Status: DC

## 2020-06-18 MED ORDER — METOPROLOL SUCCINATE ER 50 MG PO TB24
75.0000 mg | ORAL_TABLET | Freq: Every day | ORAL | Status: DC
  Administered 2020-06-19 – 2020-06-21 (×3): 75 mg via ORAL
  Filled 2020-06-18 (×3): qty 1

## 2020-06-18 MED ORDER — ACETAMINOPHEN 650 MG RE SUPP: 650.0000 mg | Freq: Four times a day (QID) | RECTAL | Status: DC | PRN

## 2020-06-18 MED ORDER — ACETAMINOPHEN 325 MG PO TABS
650.0000 mg | ORAL_TABLET | Freq: Four times a day (QID) | ORAL | Status: DC | PRN
  Administered 2020-06-19 (×2): 650 mg via ORAL
  Filled 2020-06-18 (×2): qty 2

## 2020-06-18 MED ORDER — SODIUM CHLORIDE 0.9 % IV SOLN
1.0000 g | INTRAVENOUS | Status: AC
  Administered 2020-06-19: 1 g via INTRAVENOUS
  Filled 2020-06-18: qty 1

## 2020-06-18 MED ORDER — LEVOTHYROXINE SODIUM 50 MCG PO TABS
75.0000 ug | ORAL_TABLET | Freq: Every day | ORAL | Status: DC
  Administered 2020-06-20 – 2020-06-21 (×2): 75 ug via ORAL
  Filled 2020-06-18 (×2): qty 1

## 2020-06-18 MED ORDER — ONDANSETRON HCL 4 MG PO TABS: 4.0000 mg | ORAL_TABLET | Freq: Four times a day (QID) | ORAL | Status: DC | PRN

## 2020-06-18 MED ORDER — SODIUM CHLORIDE 0.9 % IV SOLN: 1.0000 g | Freq: Once | INTRAVENOUS | Status: DC

## 2020-06-18 MED ORDER — ALPRAZOLAM 0.25 MG PO TABS
0.2500 mg | ORAL_TABLET | Freq: Every evening | ORAL | Status: DC | PRN
  Administered 2020-06-19 – 2020-06-20 (×3): 0.25 mg via ORAL
  Filled 2020-06-18 (×4): qty 1

## 2020-06-18 NOTE — ED Notes (Signed)
Bladder scan with 40ml resulted.

## 2020-06-18 NOTE — Telephone Encounter (Signed)
Appt scheduled

## 2020-06-18 NOTE — ED Provider Notes (Signed)
I saw and evaluated the patient, reviewed the resident's note and I agree with the findings and plan.  EKG:   86 year old female presents with left lower quadrant abdominal pain radiating to her suprapubic region.  Has history of UTI.  Denies any fever or chills.  On exam she has no CVA tenderness.  She however does have some moderate left lower quadrant pain.  Will order labs and CT and reevaluate   Lacretia Leigh, MD 06/18/20 2027

## 2020-06-18 NOTE — ED Provider Notes (Signed)
[facility] DEPT Provider Note   CSN: [id number] Arrival date & time: 06/18/20  1933     History Chief Complaint  Patient presents with  . Abdominal Pain    LLQ X1 WEEK    [name] is a 86 y.o. female.  Patient presents to the Ed via EMS for 1 week of abdominal pain. Reports left lower and suprapubic abdominal pain and extreme pain on urination.  She denies any fevers, nausea, vomiting, diarrhea or constipation, no vaginal bleeding or discharge. She has tried AZO and cranberry juice.  She reports having similar symptoms recently and was treated with antibiotics but unsure what this is.  Last BM today that was loose and non bloody. She reports that she has a hole in her bladder and she has an appointment with Urology on 08/23. She reports that she had previously seen Urology and was recommended to have surgery but she did not want to go that route.  She has not received COVID vaccine.        Past Medical History:  Diagnosis Date  . A-fib ([place])   . Acute CHF (congestive heart failure) ([place]) 09/02/2019  . Aortic stenosis, moderate    last echo in 2010; AVA 1.1; mild AS per echo in June 2013  . Edema of both legs    s/p laser treatment per Dr. Donnetta Hutching  . Hiatal hernia   . History of chicken pox   . HLD (hyperlipidemia)   . HTN (hypertension)   . Hypothyroidism   . IBS (irritable bowel syndrome)   . Iron deficiency anemia   . Neuropathic pain 09/06/2015  . Rheumatoid arthritis [facility])     Patient Active Problem List   Diagnosis Date Noted  . GAD (generalized anxiety disorder) 05/20/2020  . Chronic combined systolic and diastolic congestive heart failure ([place]) 04/21/2020  . Hypoxia 04/21/2020  . AKI (acute kidney injury) ([place]) 02/22/2020  . Metabolic acidosis 78/93/8101  . Iron deficiency anemia 02/22/2020  . Cystitis 02/10/2020  . Aortic atherosclerosis ([place]) 09/16/2019  . UTI (urinary tract infection) 08/21/2019  . Colovesical  fistula 05/28/2019  . Polyneuropathy 11/06/2018  . Goals of care, counseling/discussion   . Hydronephrosis 10/13/2018  . Ovarian mass 10/13/2018  . C. difficile colitis 10/13/2018  . Clostridium difficile colitis 09/24/2018  . Hypokalemia   . Hypomagnesemia   . Left ovarian cyst   . Acute diverticulitis 09/07/2018  . Leucocytosis 09/06/2018  . Malnutrition of moderate degree 07/26/2018  . Perforation of sigmoid colon due to diverticulitis 07/25/2018  . Sepsis ([place]) 07/24/2018  . Generalized abdominal pain 02/06/2018  . Paresthesia of both lower extremities 02/06/2018  . Normocytic normochromic anemia 01/07/2018  . Hyperglycemia 01/07/2018  . Iron deficiency 09/19/2017  . Increased platelet count 09/18/2017  . Pyoderma gangrenosum 01/04/2017  . Primary osteoarthritis of both hands 01/04/2017  . Primary osteoarthritis of both feet 01/04/2017  . Primary osteoarthritis of both knees 01/04/2017  . Degenerative joint disease involving multiple joints 01/04/2017  . Neuropathic pain 09/06/2015  . Erythema nodosum 09/21/2014  . Rheumatoid arthritis ([place]) 09/21/2014  . Benign essential hypertension 09/02/2014  . Rectal bleeding 05/15/2014  . Persistent atrial fibrillation ([place]) 05/11/2014  . Hyperlipidemia 04/02/2014  . PAC (premature atrial contraction) 01/23/2014  . Near syncope 01/22/2014  . Varicose veins of lower extremities with other complications 75/07/2584  . Hypothyroid 06/21/2012  . Localized edema 06/21/2012  . Hypertension 04/19/2012  . Leg edema 01/17/2012  . Anxiety 11/05/2011  . Aortic  stenosis 03/22/2011    Past Surgical History:  Procedure Laterality Date  . APPENDECTOMY  1955  . CARPAL TUNNEL RELEASE Right   . CATARACT EXTRACTION    . ENDOSCOPIC VEIN LASER TREATMENT    . ENDOVENOUS ABLATION SAPHENOUS VEIN W/ LASER  08-29-2012   left greater saphenous vein   Sherren Mocha Early MD  . ENDOVENOUS ABLATION SAPHENOUS VEIN W/ LASER  09-19-2012   right greater saphenous  vein by Curt Jews MD  . EYE SURGERY  2005   Bilateral cataract  . FOOT SURGERY  2003   bilateral Hammer Toe  . stab phlebectomy Right 01-02-2013   10-15 incisions right thigh and calf by Curt Jews MD     OB History   No obstetric history on file.     Family History  Problem Relation Age of Onset  . Heart disease Mother   . Peripheral vascular disease Mother   . Heart disease Father   . Peripheral vascular disease Father        Right leg amputation  . COPD Father   . Heart disease Brother 95       Heart Disease before age 81  . Heart attack Brother   . Peripheral vascular disease Other   . Hypertension Sister     Social History   Tobacco Use  . Smoking status: Former Smoker    Types: Cigarettes    Quit date: 10/31/1983    Years since quitting: 36.6  . Smokeless tobacco: Never Used  Vaping Use  . Vaping Use: Never used  Substance Use Topics  . Alcohol use: No  . Drug use: No    Home Medications Prior to Admission medications   Medication Sig Start Date End Date Taking? Authorizing Provider  acetaminophen (TYLENOL) 500 MG tablet Take 1,000 mg by mouth every 8 (eight) hours as needed (joint pain).    Yes [provider]  ALPRAZolam (XANAX) 0.25 MG tablet Take 1 tablet (0.25 mg total) by mouth at bedtime as needed for anxiety. FOR ANXIETY/RESTLESS LEGS 05/20/20  Yes Nche, Charlene Brooke, NP  aspirin 81 MG chewable tablet Chew 1 tablet (81 mg total) by mouth daily. 10/26/18  Yes Hennie Duos, MD  furosemide (LASIX) 20 MG tablet Take 1 tablet (20 mg total) by mouth daily as needed. 05/20/20  Yes Nche, Charlene Brooke, NP  gabapentin (NEURONTIN) 100 MG capsule Take 100 mg by mouth 3 (three) times daily.   Yes [provider]  levothyroxine (SYNTHROID) 75 MCG tablet TAKE 1 TABLET BY MOUTH ONCE DAILY BEFORE BREAKFAST 04/23/20  Yes Nche, Charlene Brooke, NP  metoprolol succinate (TOPROL-XL) 25 MG 24 hr tablet Take 75 mg by mouth daily. 06/13/20  Yes [provider]  Multiple Vitamin (MULTIVITAMIN WITH MINERALS) TABS tablet Take 1 tablet by mouth daily. 09/27/18  Yes Sheikh, Omair Latif, DO  Olopatadine HCl 0.2 % SOLN Place 1 drop into both eyes daily. 02/25/20  Yes Lassen, Arlo C, PA-C  potassium chloride SA (KLOR-CON) 20 MEQ tablet Take 1 tablet (20 mEq total) by mouth daily as needed. 05/20/20  Yes Nche, Charlene Brooke, NP  DULoxetine (CYMBALTA) 20 MG capsule Take 1 capsule (20 mg total) by mouth daily. Patient not taking: Reported on 05/20/2020 04/21/20   [name], NP    Allergies    Infliximab, Aspirin, Carvedilol, Codeine, and Penicillins  Review of Systems   Review of Systems  Constitutional: Negative for appetite change, chills and fever.  Gastrointestinal: Positive for abdominal pain  and constipation. Negative for blood in stool, diarrhea, nausea and vomiting.  Genitourinary: Positive for difficulty urinating and dysuria. Negative for decreased urine volume, flank pain, hematuria, vaginal bleeding and vaginal discharge.  Neurological: Negative for dizziness and weakness.    Physical Exam Updated Vital Signs BP (!) 150/78   Pulse 80   Temp 98.7 F (37.1 C) (Oral)   Resp 18   Ht 5' (1.524 m)   Wt 51.3 kg   SpO2 95%   BMI 22.07 kg/m   Physical Exam Constitutional:      General: She is not in acute distress. HENT:     Mouth/Throat:     Mouth: Mucous membranes are moist.  Cardiovascular:     Rate and Rhythm: Rhythm irregular.     Heart sounds: Murmur heard.   Pulmonary:     Effort: Pulmonary effort is normal.  Abdominal:     General: Abdomen is protuberant. Bowel sounds are increased. There is no distension or abdominal bruit.     Palpations: Abdomen is soft.     Tenderness: There is abdominal tenderness in the suprapubic area and left lower quadrant. There is right CVA tenderness. There is no left CVA tenderness, guarding or rebound.  Skin:    General: Skin is warm and dry.     Capillary Refill:  Capillary refill takes less than 2 seconds.  Neurological:     General: No focal deficit present.     Mental Status: She is alert and oriented to person, place, and time.     ED Results / Procedures / Treatments   Labs (all labs ordered are listed, but only abnormal results are displayed) Labs Reviewed  URINALYSIS, ROUTINE W REFLEX MICROSCOPIC - Abnormal; Notable for the following components:      Result Value   Color, Urine AMBER (*)    Hgb urine dipstick SMALL (*)    Protein, ur 100 (*)    Nitrite POSITIVE (*)    Leukocytes,Ua SMALL (*)    WBC, UA >50 (*)    Bacteria, UA RARE (*)    All other components within normal limits  CBC WITH DIFFERENTIAL/PLATELET - Abnormal; Notable for the following components:   RBC 3.82 (*)    Hemoglobin 8.7 (*)    HCT 30.7 (*)    MCH 22.8 (*)    MCHC 28.3 (*)    RDW 17.7 (*)    All other components within normal limits  COMPREHENSIVE METABOLIC PANEL - Abnormal; Notable for the following components:   Potassium 5.6 (*)    CO2 20 (*)    BUN 50 (*)    Creatinine, Ser 2.56 (*)    Calcium 8.7 (*)    Alkaline Phosphatase 131 (*)    GFR calc non Af Amer 16 (*)    GFR calc Af Amer 18 (*)    All other components within normal limits  SARS CORONAVIRUS 2 BY RT PCR (HOSPITAL ORDER, [facility])  CBC  BASIC METABOLIC PANEL    EKG None  Radiology CT ABDOMEN PELVIS WO CONTRAST  Result Date: 06/18/2020 CLINICAL DATA:  Left lower quadrant abdominal pain. EXAM: CT ABDOMEN AND PELVIS WITHOUT CONTRAST TECHNIQUE: Multidetector CT imaging of the abdomen and pelvis was performed following the standard protocol without IV contrast. COMPARISON:  February 10, 2020 FINDINGS: Lower chest: No acute abnormality. Hepatobiliary: No focal liver abnormality is seen. No gallstones, gallbladder wall thickening, or biliary dilatation. Pancreas: Unremarkable. No pancreatic ductal dilatation or surrounding inflammatory changes.  Spleen: Normal in size  without focal abnormality. Adrenals/Urinary Tract: Adrenal glands are unremarkable. Kidneys are normal in size, without focal lesions. Stable moderate severity left-sided hydronephrosis and hydroureter is seen. A mild-to-moderate amount of air is seen within the lumen of a partially contracted urinary bladder. This is seen on the prior exam mild diffuse urinary bladder wall thickening is again seen. This is most prominent along the anterolateral aspect of the bladder on the left and is stable in severity when compared to the prior study. Stomach/Bowel: There is a very large gastric hernia. The appendix is not identified. No evidence of bowel dilatation. Numerous diverticula are seen throughout the sigmoid colon. Persistent marked severity thickening of the sigmoid colon is seen. Vascular/Lymphatic: There is marked severity calcification of the abdominal aorta and bilateral common iliac arteries, without evidence of aneurysmal dilatation. No enlarged abdominal or pelvic lymph nodes. Reproductive: The uterus is positioned to the right of midline and is otherwise normal in appearance. Predominant stable 3.3 cm x 2.0 cm, 1.5 cm x 1.3 cm and 1.5 cm x 1.2 cm cysts are seen along the anterior aspect of the left adnexa. Other: No abdominal wall hernia or abnormality. No abdominopelvic ascites. Musculoskeletal: Multilevel degenerative changes are again seen throughout the lumbar spine. IMPRESSION: 1. Marked severity diverticular disease throughout the sigmoid colon, unchanged in appearance when compared to the prior study. The presence of an underlying neoplastic process cannot be excluded. 2. Air within the urinary bladder lumen with stable asymmetric bladder wall thickening, consistent with the patient's known chronic colovesical fistula and subsequent cystitis. 3. Stable left adnexal cystic lesion. 4. Large gastric hernia. 5. Stable moderate severity left-sided hydronephrosis and hydroureter. Aortic Atherosclerosis  (ICD10-I70.0). Electronically Signed   By: Virgina Norfolk M.D.   On: 06/18/2020 21:11    Procedures Procedures (including critical care time)  Medications Ordered in ED Medications  cefTRIAXone (ROCEPHIN) 1 g in sodium chloride 0.9 % 100 mL IVPB (has no administration in time range)  0.9 %  sodium chloride infusion (has no administration in time range)  ondansetron (ZOFRAN) tablet 4 mg (has no administration in time range)    Or  ondansetron (ZOFRAN) injection 4 mg (has no administration in time range)  enoxaparin (LOVENOX) injection 30 mg (has no administration in time range)  acetaminophen (TYLENOL) tablet 650 mg (has no administration in time range)    Or  acetaminophen (TYLENOL) suppository 650 mg (has no administration in time range)    ED Course  I have reviewed the triage vital signs and the nursing notes.  Pertinent labs & imaging results that were available during my care of the patient were reviewed by me and considered in my medical decision making (see chart for details).    MDM Rules/Calculators/A&P                          Erdine Hulen is a 86 y.o. female who presented to the ED for abdominal pain for a week.  History of colovesicular fistula.  Vital signs stable and afebrile.  Abdominal exam positive for LLQ and suprapubic tenderness.  Urine positive for UTI. Labs significant for Cr 2.56 and Potassium 5.6.  Bladder scan shows 96mls of urine.  Differentials include acute cystitis, diverticulitis, abdominal abscess given history of colovesicular fistula.  CT abd/pelvis shows air within the urinary bladder that is unchanged from prior study. Start IV N/S at 125/hr and Rocephin 1gm now.  Will call inpatient team for  admission given AKI and possible pyelonephritis.  Final Clinical Impression(s) / ED Diagnoses Final diagnoses:  AKI (acute kidney injury) [facility])    Rx / [place] Orders ED Discharge Orders    None       Carollee Leitz, MD 06/18/20 2313    Lacretia Leigh, MD 06/22/20 [phone]

## 2020-06-18 NOTE — Progress Notes (Signed)
Pharmacy Antibiotic Note  [name] is a 86 y.o. female admitted on 06/18/2020 with UTI.  Pharmacy has been consulted for Meropenem dosing.   Hx ESBL UTI in April.  Afebrile, WBC WNL AKI- baseline Scr ~1  Plan: Meropenem 1gm IV q12h Monitor renal function and cx data    Height: 5' (152.4 cm) Weight: 51.3 kg (113 lb) IBW/kg (Calculated) : 45.5  Temp (24hrs), Avg:98.7 F (37.1 C), Min:98.7 F (37.1 C), Max:98.7 F (37.1 C)  Recent Labs  Lab 06/18/20 2146  WBC 10.1  CREATININE 2.56*    Estimated Creatinine Clearance: 10.5 mL/min (A) (by C-G formula based on SCr of 2.56 mg/dL (H)).    Allergies  Allergen Reactions  . Infliximab Other (See Comments)    REACTION: "Enlarged intestines and hernia. Also pushed intestines on lower part of lungs." REACTION: "Enlarged intestines and hernia. Also pushed intestines on lower part of lungs."  . Aspirin Nausea And Vomiting    Low Dose is ok Stomach problems  . Carvedilol Other (See Comments)    Fatigue/ ill Fatigue/ ill  . Codeine Nausea And Vomiting    "Deathly Sick"  . Penicillins Rash    Has patient had a PCN reaction causing immediate rash, facial/tongue/throat swelling, SOB or lightheadedness with hypotension: Y Has patient had a PCN reaction causing severe rash involving mucus membranes or skin necrosis: Y Has patient had a PCN reaction that required hospitalization: N Has patient had a PCN reaction occurring within the last 10 years: N If all of the above answers are "NO", then may proceed with Cephalosporin use.     Antimicrobials this admission: 8/20 Meropenem >>   Dose adjustments this admission:  Microbiology results: BCx: UCx:  02/10/20 UCx: ESBL Ecoli  Thank you for allowing pharmacy to be a part of this patient's care.  Netta Cedars PharmD, BCPS 06/18/2020 11:25 PM

## 2020-06-18 NOTE — H&P (Signed)
History and Physical    [name] GTX:[id number] DOB: [birth year] DOA: 06/18/2020  PCP: [name], NP  Patient coming from: Home  I have personally briefly reviewed patient's old medical records in [place]  Chief Complaint: Abd pain  HPI: [name] is a 86 y.o. female with medical history significant of A.Fib not on anticoagulation, HFpEF, severe AS was supposed to follow up with cards after April admit to see about TAVR, severe diverticular dz with colovesicular fistula.  Pt had ESBL E.Coli UTI in April due to colovesicular fistula.  Treated with carbapenems, declined invasive work up and treatment: states "at my age?"   She reports that she has a hole in her bladder and she has an appointment with Urology on 08/23. She reports that she had previously seen Urology and was recommended to have surgery but she did not want to go that route.  Patient presents to the Ed via EMS for 1 week of abdominal pain. Reports left lower and suprapubic abdominal pain and extreme pain on urination.  She denies any fevers, nausea, vomiting, diarrhea or constipation, no vaginal bleeding or discharge. She has tried AZO and cranberry juice.   ED Course: UA c/w UTI  CT reveals: 1) air in bladder c/w known colovesicular fistula 2) chronic and stable cystic adenexal mass 3) chronic and stable L hydronephrosis. 4) severe diverticular dz in sigmoid, unchanged since apirl, cant rule out underlying neoplasm.   Review of Systems: As per HPI, otherwise all review of systems negative.  Past Medical History:  Diagnosis Date  . A-fib ([place])   . Acute CHF (congestive heart failure) ([place]) 09/02/2019  . Aortic stenosis, moderate    last echo in 2010; AVA 1.1; mild AS per echo in June 2013  . Edema of both legs    s/p laser treatment per Dr. Donnetta Hutching  . Hiatal hernia   . History of chicken pox   . HLD (hyperlipidemia)   . HTN (hypertension)   . Hypothyroidism   . IBS (irritable  bowel syndrome)   . Iron deficiency anemia   . Neuropathic pain 09/06/2015  . Rheumatoid arthritis [facility])     Past Surgical History:  Procedure Laterality Date  . APPENDECTOMY  1955  . CARPAL TUNNEL RELEASE Right   . CATARACT EXTRACTION    . ENDOSCOPIC VEIN LASER TREATMENT    . ENDOVENOUS ABLATION SAPHENOUS VEIN W/ LASER  08-29-2012   left greater saphenous vein   Sherren Mocha Early MD  . ENDOVENOUS ABLATION SAPHENOUS VEIN W/ LASER  09-19-2012   right greater saphenous vein by Curt Jews MD  . EYE SURGERY  2005   Bilateral cataract  . FOOT SURGERY  2003   bilateral Hammer Toe  . stab phlebectomy Right 01-02-2013   10-15 incisions right thigh and calf by Curt Jews MD     reports that she quit smoking about 36 years ago. Her smoking use included cigarettes. She has never used smokeless tobacco. She reports that she does not drink alcohol and does not use drugs.  Allergies  Allergen Reactions  . Infliximab Other (See Comments)    REACTION: "Enlarged intestines and hernia. Also pushed intestines on lower part of lungs." REACTION: "Enlarged intestines and hernia. Also pushed intestines on lower part of lungs."  . Aspirin Nausea And Vomiting    Low Dose is ok Stomach problems  . Carvedilol Other (See Comments)    Fatigue/ ill Fatigue/ ill  . Codeine Nausea And Vomiting    "  Deathly Sick"  . Penicillins Rash    Has patient had a PCN reaction causing immediate rash, facial/tongue/throat swelling, SOB or lightheadedness with hypotension: Y Has patient had a PCN reaction causing severe rash involving mucus membranes or skin necrosis: Y Has patient had a PCN reaction that required hospitalization: N Has patient had a PCN reaction occurring within the last 10 years: N If all of the above answers are "NO", then may proceed with Cephalosporin use.     Family History  Problem Relation Age of Onset  . Heart disease Mother   . Peripheral vascular disease Mother   . Heart disease Father   .  Peripheral vascular disease Father        Right leg amputation  . COPD Father   . Heart disease Brother 22       Heart Disease before age 24  . Heart attack Brother   . Peripheral vascular disease Other   . Hypertension Sister      Prior to Admission medications   Medication Sig Start Date End Date Taking? Authorizing Provider  acetaminophen (TYLENOL) 500 MG tablet Take 1,000 mg by mouth every 8 (eight) hours as needed (joint pain).    Yes [provider]  ALPRAZolam (XANAX) 0.25 MG tablet Take 1 tablet (0.25 mg total) by mouth at bedtime as needed for anxiety. FOR ANXIETY/RESTLESS LEGS 05/20/20  Yes Nche, Charlene Brooke, NP  aspirin 81 MG chewable tablet Chew 1 tablet (81 mg total) by mouth daily. 10/26/18  Yes Hennie Duos, MD  furosemide (LASIX) 20 MG tablet Take 1 tablet (20 mg total) by mouth daily as needed. 05/20/20  Yes Nche, Charlene Brooke, NP  gabapentin (NEURONTIN) 100 MG capsule Take 100 mg by mouth 3 (three) times daily.   Yes [provider]  levothyroxine (SYNTHROID) 75 MCG tablet TAKE 1 TABLET BY MOUTH ONCE DAILY BEFORE BREAKFAST 04/23/20  Yes Nche, Charlene Brooke, NP  metoprolol succinate (TOPROL-XL) 25 MG 24 hr tablet Take 75 mg by mouth daily. 06/13/20  Yes [provider]  Multiple Vitamin (MULTIVITAMIN WITH MINERALS) TABS tablet Take 1 tablet by mouth daily. 09/27/18  Yes Sheikh, Omair Latif, DO  Olopatadine HCl 0.2 % SOLN Place 1 drop into both eyes daily. 02/25/20  Yes Lassen, Arlo C, PA-C  potassium chloride SA (KLOR-CON) 20 MEQ tablet Take 1 tablet (20 mEq total) by mouth daily as needed. 05/20/20  Yes Nche, Charlene Brooke, NP    Physical Exam: Vitals:   06/18/20 1946 06/18/20 2003 06/18/20 2005 06/18/20 2219  BP:  (!) 142/71  (!) 150/78  Pulse:  83  80  Resp:  16  18  Temp:  98.7 F (37.1 C)    TempSrc:  Oral    SpO2: 97% 99%  95%  Weight:   51.3 kg   Height:   5' (1.524 m)     Constitutional: NAD, calm, comfortable Eyes: PERRL,  lids and conjunctivae normal ENMT: Mucous membranes are moist. Posterior pharynx clear of any exudate or lesions.Normal dentition.  Neck: normal, supple, no masses, no thyromegaly Respiratory: clear to auscultation bilaterally, no wheezing, no crackles. Normal respiratory effort. No accessory muscle use.  Cardiovascular: Regular rate and rhythm, no murmurs / rubs / gallops. No extremity edema. 2+ pedal pulses. No carotid bruits.  Abdomen: no tenderness, no masses palpated. No hepatosplenomegaly. Bowel sounds positive.  Musculoskeletal: no clubbing / cyanosis. No joint deformity upper and lower extremities. Good ROM, no contractures. Normal muscle tone.  Skin: no rashes,  lesions, ulcers. No induration Neurologic: CN 2-12 grossly intact. Sensation intact, DTR normal. Strength 5/5 in all 4.  Psychiatric: Normal judgment and insight. Alert and oriented x 3. Normal mood.    Labs on Admission: I have personally reviewed following labs and imaging studies  CBC: Recent Labs  Lab 06/18/20 2146  WBC 10.1  NEUTROABS 7.0  HGB 8.7*  HCT 30.7*  MCV 80.4  PLT 503   Basic Metabolic Panel: Recent Labs  Lab 06/18/20 2146  NA 136  K 5.6*  CL 107  CO2 20*  GLUCOSE 99  BUN 50*  CREATININE 2.56*  CALCIUM 8.7*   GFR: Estimated Creatinine Clearance: 10.5 mL/min (A) (by C-G formula based on SCr of 2.56 mg/dL (H)). Liver Function Tests: Recent Labs  Lab 06/18/20 2146  AST 40  ALT 23  ALKPHOS 131*  BILITOT 0.5  PROT 6.9  ALBUMIN 3.6   No results for input(s): LIPASE, AMYLASE in the last 168 hours. No results for input(s): AMMONIA in the last 168 hours. Coagulation Profile: No results for input(s): INR, PROTIME in the last 168 hours. Cardiac Enzymes: No results for input(s): CKTOTAL, CKMB, CKMBINDEX, TROPONINI in the last 168 hours. BNP (last 3 results) No results for input(s): PROBNP in the last 8760 hours. HbA1C: No results for input(s): HGBA1C in the last 72 hours. CBG: No  results for input(s): GLUCAP in the last 168 hours. Lipid Profile: No results for input(s): CHOL, HDL, LDLCALC, TRIG, CHOLHDL, LDLDIRECT in the last 72 hours. Thyroid Function Tests: No results for input(s): TSH, T4TOTAL, FREET4, T3FREE, THYROIDAB in the last 72 hours. Anemia Panel: No results for input(s): VITAMINB12, FOLATE, FERRITIN, TIBC, IRON, RETICCTPCT in the last 72 hours. Urine analysis:    Component Value Date/Time   COLORURINE AMBER (A) 06/18/2020 2146   APPEARANCEUR CLEAR 06/18/2020 2146   LABSPEC 1.014 06/18/2020 2146   PHURINE 5.0 06/18/2020 2146   GLUCOSEU NEGATIVE 06/18/2020 2146   HGBUR SMALL (A) 06/18/2020 2146   BILIRUBINUR NEGATIVE 06/18/2020 2146   [place] NEGATIVE 06/18/2020 2146   PROTEINUR 100 (A) 06/18/2020 2146   UROBILINOGEN 0.2 07/03/2014 0313   NITRITE POSITIVE (A) 06/18/2020 2146   LEUKOCYTESUR SMALL (A) 06/18/2020 2146    Radiological Exams on Admission: CT ABDOMEN PELVIS WO CONTRAST  Result Date: 06/18/2020 CLINICAL DATA:  Left lower quadrant abdominal pain. EXAM: CT ABDOMEN AND PELVIS WITHOUT CONTRAST TECHNIQUE: Multidetector CT imaging of the abdomen and pelvis was performed following the standard protocol without IV contrast. COMPARISON:  February 10, 2020 FINDINGS: Lower chest: No acute abnormality. Hepatobiliary: No focal liver abnormality is seen. No gallstones, gallbladder wall thickening, or biliary dilatation. Pancreas: Unremarkable. No pancreatic ductal dilatation or surrounding inflammatory changes. Spleen: Normal in size without focal abnormality. Adrenals/Urinary Tract: Adrenal glands are unremarkable. Kidneys are normal in size, without focal lesions. Stable moderate severity left-sided hydronephrosis and hydroureter is seen. A mild-to-moderate amount of air is seen within the lumen of a partially contracted urinary bladder. This is seen on the prior exam mild diffuse urinary bladder wall thickening is again seen. This is most prominent along  the anterolateral aspect of the bladder on the left and is stable in severity when compared to the prior study. Stomach/Bowel: There is a very large gastric hernia. The appendix is not identified. No evidence of bowel dilatation. Numerous diverticula are seen throughout the sigmoid colon. Persistent marked severity thickening of the sigmoid colon is seen. Vascular/Lymphatic: There is marked severity calcification of the abdominal aorta and bilateral common iliac  arteries, without evidence of aneurysmal dilatation. No enlarged abdominal or pelvic lymph nodes. Reproductive: The uterus is positioned to the right of midline and is otherwise normal in appearance. Predominant stable 3.3 cm x 2.0 cm, 1.5 cm x 1.3 cm and 1.5 cm x 1.2 cm cysts are seen along the anterior aspect of the left adnexa. Other: No abdominal wall hernia or abnormality. No abdominopelvic ascites. Musculoskeletal: Multilevel degenerative changes are again seen throughout the lumbar spine. IMPRESSION: 1. Marked severity diverticular disease throughout the sigmoid colon, unchanged in appearance when compared to the prior study. The presence of an underlying neoplastic process cannot be excluded. 2. Air within the urinary bladder lumen with stable asymmetric bladder wall thickening, consistent with the patient's known chronic colovesical fistula and subsequent cystitis. 3. Stable left adnexal cystic lesion. 4. Large gastric hernia. 5. Stable moderate severity left-sided hydronephrosis and hydroureter. Aortic Atherosclerosis (ICD10-I70.0). Electronically Signed   By: Virgina Norfolk M.D.   On: 06/18/2020 21:11    EKG: Independently reviewed.  Assessment/Plan Principal Problem:   UTI (urinary tract infection) Active Problems:   Aortic stenosis   Persistent atrial fibrillation (HCC)   Benign essential hypertension   Left ovarian cyst   Hydronephrosis   Colovesical fistula   AKI (acute kidney injury) (HCC)   Chronic combined systolic and  diastolic congestive heart failure ([place])    1. UTI due to colovesicular fistula - 1. Empiric merrem given h/o ESBL in April 2. Culture pending 2. AKI -  1. ? Pre-renal due to UTI 2. Hold diuretics 3. IVF: NS at 125 cc/hr 4. Strict intake and output 5. Repeat BMP in AM 3. A.Fib - 1. Cont metoprolol for rate control 2. Has declined anticoagulation in the past 4. L hydronephrosis, L ovarian cyst - 1. Chronic and unchanged on imaging 5. HTN - 1. Cont metoprolol 6. Chronic combined CHF - 1. Holding diuretics 7. Severe AS - 1. Supposed to follow up outpt for possible TAVR 2. dont see where she did this 3. Though not sure she would want invasive procedures given age. Sounds like she doesn't for the colovesicular fistula 1. Might be time to get Pal care involved to discuss goals of care, code status, etc.  DVT prophylaxis: Lovenox Code Status: Full code for now Family Communication: None Disposition Plan: Home after AKI resolved Consults called: None, consider pal care consult in AM Admission status: Admit to inpatient  Severity of Illness: The appropriate patient status for this patient is INPATIENT. Inpatient status is judged to be reasonable and necessary in order to provide the required intensity of service to ensure the patient's safety. The patient's presenting symptoms, physical exam findings, and initial radiographic and laboratory data in the context of their chronic comorbidities is felt to place them at high risk for further clinical deterioration. Furthermore, it is not anticipated that the patient will be medically stable for discharge from the hospital within 2 midnights of admission. The following factors support the patient status of inpatient.   IP status for AKI with creat of 2.5 up from 0.9 in June.  * I certify that at the point of admission it is my clinical judgment that the patient will require inpatient hospital care spanning beyond 2 midnights from the point of  admission due to high intensity of service, high risk for further deterioration and high frequency of surveillance required.*    [name] M. DO [facility]  How to contact the [facility] Attending or Consulting provider [place] or  covering provider during after hours [place], for this patient?  1. Check the care team in [facility] and look for a) attending/consulting [facility] provider listed and b) the [name] team listed 2. Log into [email or web address]  Amion Physician Scheduling and messaging for groups and whole hospitals  On call and physician scheduling software for group practices, residents, hospitalists and other medical providers for call, clinic, rotation and shift schedules. On[employer] is a hospital-wide system for scheduling doctors and paging doctors on call. EasyPlot is for scientific plotting and data analysis.  [email or web address]  and use [place]'s universal password to access. If you do not have the password, please contact the hospital operator.  3. Locate the [facility] provider you are looking for under [facility] and page to a number that you can be directly reached. 4. If you still have difficulty reaching the provider, please page the [facility] (Director on Call) for the Hospitalists listed on amion for assistance.  06/18/2020, 11:49 PM

## 2020-06-18 NOTE — Telephone Encounter (Signed)
Needs to schedule video appt to discuss urinary symptoms or she should schedule appt with urology

## 2020-06-18 NOTE — ED Triage Notes (Signed)
Pt BIB EMS c/o LLQ abdominal pain, painful urination, and nausea and dry heaving x1 week. Pt denies N/V/D, but does indorse previous urine infections. Pt sts she has been taking Azo w/o relief.  BP-124/78 HR-74 RR- 16 O2- 97%

## 2020-06-19 DIAGNOSIS — A419 Sepsis, unspecified organism: Secondary | ICD-10-CM

## 2020-06-19 DIAGNOSIS — R6520 Severe sepsis without septic shock: Secondary | ICD-10-CM

## 2020-06-19 LAB — CBC
HCT: 31.4 % — ABNORMAL LOW (ref 36.0–46.0)
Hemoglobin: 8.9 g/dL — ABNORMAL LOW (ref 12.0–15.0)
MCH: 22.9 pg — ABNORMAL LOW (ref 26.0–34.0)
MCHC: 28.3 g/dL — ABNORMAL LOW (ref 30.0–36.0)
MCV: 80.9 fL (ref 80.0–100.0)
Platelets: 304 10*3/uL (ref 150–400)
RBC: 3.88 MIL/uL (ref 3.87–5.11)
RDW: 17.7 % — ABNORMAL HIGH (ref 11.5–15.5)
WBC: 10.4 10*3/uL (ref 4.0–10.5)
nRBC: 0 % (ref 0.0–0.2)

## 2020-06-19 LAB — BASIC METABOLIC PANEL
Anion gap: 10 (ref 5–15)
Anion gap: 14 (ref 5–15)
BUN: 49 mg/dL — ABNORMAL HIGH (ref 8–23)
BUN: 47 mg/dL — ABNORMAL HIGH (ref 8–23)
CO2: 19 mmol/L — ABNORMAL LOW (ref 22–32)
CO2: 20 mmol/L — ABNORMAL LOW (ref 22–32)
Calcium: 9 mg/dL (ref 8.9–10.3)
Calcium: 8.9 mg/dL (ref 8.9–10.3)
Chloride: 109 mmol/L (ref 98–111)
Chloride: 105 mmol/L (ref 98–111)
Creatinine, Ser: 2.42 mg/dL — ABNORMAL HIGH (ref 0.44–1.00)
Creatinine, Ser: 2.11 mg/dL — ABNORMAL HIGH (ref 0.44–1.00)
GFR calc Af Amer: 20 mL/min — ABNORMAL LOW (ref >60)
GFR calc Af Amer: 23 mL/min — ABNORMAL LOW (ref >60)
GFR calc non Af Amer: 17 mL/min — ABNORMAL LOW (ref >60)
GFR calc non Af Amer: 20 mL/min — ABNORMAL LOW (ref >60)
Glucose, Bld: 105 mg/dL — ABNORMAL HIGH (ref 70–99)
Glucose, Bld: 114 mg/dL — ABNORMAL HIGH (ref 70–99)
Potassium: 5.7 mmol/L — ABNORMAL HIGH (ref 3.5–5.1)
Potassium: 5.5 mmol/L — ABNORMAL HIGH (ref 3.5–5.1)
Sodium: 138 mmol/L (ref 135–145)
Sodium: 139 mmol/L (ref 135–145)

## 2020-06-19 LAB — SARS CORONAVIRUS 2 BY RT PCR (HOSPITAL ORDER, PERFORMED IN ~~LOC~~ HOSPITAL LAB): SARS Coronavirus 2: NEGATIVE

## 2020-06-19 NOTE — Hospital Course (Addendum)
[name] is a 86 year old female with PMH colovesical fistula, rheumatoid arthritis, IDA, hypothyroidism, hypertension, hyperlipidemia, moderate aortic stenosis, PAF (not on anticoagulation), CHF who presented to the hospital with abdominal pain.  She has had recurrent hospitalizations in the past for ongoing UTIs related to her fistula.  Last hospitalization was in April 2021 at which time she grew E. coli ESBL.  She has also declined surgical intervention for her fistula stating she does not want it due to her advanced age. She also has not considered palliative care/hospice. She underwent CT abdomen/pelvis in the ER which showed known severe diverticular disease.  Also revealed "Air within the urinary bladder lumen with stable asymmetric bladder wall thickening, consistent with the patient's known chronic colovesical fistula and subsequent cystitis.  Stable moderate severity left-sided hydronephrosis and hydroureter."  She was started on meropenem given her history of ESBL E. coli.  Also started on fluids in setting of acute kidney injury.  She was also evaluated by physical therapy and followed daily.  Urine cultures returned with Klebsiella.  She was deescalated down to [place] to complete a total of 14-day course of antibiotics at discharge for complicated UTI.  She was informed that she likely would have recurrent infections with her ongoing untreated colovesical fistula.  She again stated prior to discharge that she had discussed with her son, and they are electing to continue to avoid any surgical intervention at this time.  She understands recommendations for returning to the hospital including recurrent fevers, worsening abdominal pain, and worsening dysuria.  Prescription sent to her pharmacy at time of discharge.

## 2020-06-19 NOTE — Assessment & Plan Note (Addendum)
-  Patient has known fistula.  She has declined surgical intervention in context of her advanced age she says -Outpatient follow-up with urology to discuss chronic suppressive antibiotic therapy

## 2020-06-19 NOTE — Progress Notes (Signed)
PROGRESS NOTE    [name]   OMV:[id number]  DOB: 1927-05-05  DOA: 06/18/2020     1  PCP: [name], NP  CC: Abdominal pain  Hospital Course: Ms. Rief is a 86 year old female with PMH colovesical fistula, rheumatoid arthritis, IDA, hypothyroidism, hypertension, hyperlipidemia, moderate aortic stenosis, PAF (not on anticoagulation), CHF who presented to the hospital with abdominal pain.  She has had recurrent hospitalizations in the past for ongoing UTIs related to her fistula.  Last hospitalization was in April 2021 at which time she grew E. coli ESBL.  She has also declined surgical intervention for her fistula stating she does not want it due to her advanced age. She also has not considered palliative care/hospice. She underwent CT abdomen/pelvis in the ER which showed known severe diverticular disease.  Also revealed "Air within the urinary bladder lumen with stable asymmetric bladder wall thickening, consistent with the patient's known chronic colovesical fistula and subsequent cystitis.  Stable moderate severity left-sided hydronephrosis and hydroureter."  She was started on meropenem given her history of ESBL E. coli.  Also started on fluids in setting of acute kidney injury.   Interval History:  Seen in the ER this morning still waiting on a room.  She was severely lethargic and unable to answer many questions.  She appeared uncomfortable but was in no obvious distress.  No family present.  Old records reviewed in assessment of this patient  ROS: Review of systems not obtained due to patient factors.  Extreme lethargy  Assessment & Plan: Persistent atrial fibrillation (HCC) - not on AC - continue lopressor  Benign essential hypertension - continue lopressor  Hydronephrosis - see fistula and sepsis  Left ovarian cyst - chronic and unchanged on imaging  Chronic combined systolic and diastolic congestive heart failure (HCC) - hold diuretics; on  IVF in setting of AKI - monitor for s/s exacerbation   Aortic stenosis - followed outpatient; was to undergo workup for possible TAVR; she does not appear to want much surgery at her age - monitor for now and if improves will discuss further if she wants outpatient workup again for TAVR  AKI (acute kidney injury) ([place]) - continue IVF and monitor response  - BMP in am   UTI (urinary tract infection) - likely associated with fistula - hx ESBL infection; follow-up pending cultures - continue meropenem, de-escalate if able   Colovesical fistula -Patient has known fistula.  She has declined surgical intervention in context of her advanced age she says -She has been seen by urology in the past, will need to clarify if she needs to be on long-term suppressive therapy  Severe sepsis with acute organ dysfunction (HCC) - tachycardia (127), tachypnea (20), AKI (creat 2.56 up from baseline 1), acute encephalopathy; source considered UTI/urinary 2/2 colovesical fistula with history of ESBL infection - continue IVF - continue meropenem; f/u cultures; narrow as able  -If fever obtain blood cultures   Antimicrobials: Meropenem 06/18/2020>> present  DVT prophylaxis: Lovenox Code Status: Full Family Communication: None present Disposition Plan: Status is: Inpatient  Remains inpatient appropriate because:Altered mental status, Unsafe d/c plan, IV treatments appropriate due to intensity of illness or inability to take PO and Inpatient level of care appropriate due to severity of illness   Dispo: The patient is from: Home              Anticipated d/c is to: Pending PT eval  Anticipated d/c date is: 3 days              Patient currently is not medically stable to d/c.       Objective: Blood pressure (!) 150/83, pulse 78, temperature 97.9 F (36.6 C), temperature source Oral, resp. rate 15, height 5' (1.524 m), weight 51.3 kg, SpO2 (!) 89 %.  Examination: General appearance:  Pleasant elderly woman resting in bed in no distress Head: Normocephalic, without obvious abnormality, atraumatic Eyes: EOMI Lungs: clear to auscultation bilaterally Heart: regular rate and rhythm and S1, S2 normal Abdomen: normal findings: bowel sounds normal and soft, non-tender Extremities: No edema Skin: mobility and turgor normal Neurologic: Grossly normal  Consultants:   None  Procedures:   None  Data Reviewed: I have personally reviewed following labs and imaging studies Results for orders placed or performed during the hospital encounter of 06/18/20 (from the past 24 hour(s))  Urinalysis, Routine w reflex microscopic Urine, Clean Catch     Status: Abnormal   Collection Time: 06/18/20  9:46 PM  Result Value Ref Range   Color, Urine AMBER (A) YELLOW   APPearance CLEAR CLEAR   Specific Gravity, Urine 1.014 1.005 - 1.030   pH 5.0 5.0 - 8.0   Glucose, UA NEGATIVE NEGATIVE mg/dL   Hgb urine dipstick SMALL (A) NEGATIVE   Bilirubin Urine NEGATIVE NEGATIVE   Ketones, ur NEGATIVE NEGATIVE mg/dL   Protein, ur 100 (A) NEGATIVE mg/dL   Nitrite POSITIVE (A) NEGATIVE   Leukocytes,Ua SMALL (A) NEGATIVE   RBC / HPF 6-10 0 - 5 RBC/hpf   WBC, UA >50 (H) 0 - 5 WBC/hpf   Bacteria, UA RARE (A) NONE SEEN   Squamous Epithelial / LPF 0-5 0 - 5  CBC with Differential     Status: Abnormal   Collection Time: 06/18/20  9:46 PM  Result Value Ref Range   WBC 10.1 4.0 - 10.5 K/uL   RBC 3.82 (L) 3.87 - 5.11 MIL/uL   Hemoglobin 8.7 (L) 12.0 - 15.0 g/dL   HCT 30.7 (L) 36 - 46 %   MCV 80.4 80.0 - 100.0 fL   MCH 22.8 (L) 26.0 - 34.0 pg   MCHC 28.3 (L) 30.0 - 36.0 g/dL   RDW 17.7 (H) 11.5 - 15.5 %   Platelets 323 150 - 400 K/uL   nRBC 0.0 0.0 - 0.2 %   Neutrophils Relative % 69 %   Neutro Abs 7.0 1.7 - 7.7 K/uL   Lymphocytes Relative 19 %   Lymphs Abs 1.9 0.7 - 4.0 K/uL   Monocytes Relative 6 %   Monocytes Absolute 0.6 0 - 1 K/uL   Eosinophils Relative 5 %   Eosinophils Absolute 0.5 0 - 0  K/uL   Basophils Relative 1 %   Basophils Absolute 0.1 0 - 0 K/uL   Immature Granulocytes 0 %   Abs Immature Granulocytes 0.03 0.00 - 0.07 K/uL  Comprehensive metabolic panel     Status: Abnormal   Collection Time: 06/18/20  9:46 PM  Result Value Ref Range   Sodium 136 135 - 145 mmol/L   Potassium 5.6 (H) 3.5 - 5.1 mmol/L   Chloride 107 98 - 111 mmol/L   CO2 20 (L) 22 - 32 mmol/L   Glucose, Bld 99 70 - 99 mg/dL   BUN 50 (H) 8 - 23 mg/dL   Creatinine, Ser 2.56 (H) 0.44 - 1.00 mg/dL   Calcium 8.7 (L) 8.9 - 10.3 mg/dL   Total Protein 6.9  6.5 - 8.1 g/dL   Albumin 3.6 3.5 - 5.0 g/dL   AST 40 15 - 41 U/L   ALT 23 0 - 44 U/L   Alkaline Phosphatase 131 (H) 38 - 126 U/L   Total Bilirubin 0.5 0.3 - 1.2 mg/dL   GFR calc non Af Amer 16 (L) >60 mL/min   GFR calc Af Amer 18 (L) >60 mL/min   Anion gap 9 5 - 15  SARS Coronavirus 2 by RT PCR (hospital order, performed in [place] hospital lab) Nasopharyngeal Nasopharyngeal Swab     Status: None   Collection Time: 06/18/20 11:36 PM   Specimen: Nasopharyngeal Swab  Result Value Ref Range   SARS Coronavirus 2 NEGATIVE NEGATIVE  CBC     Status: Abnormal   Collection Time: 06/19/20  5:00 AM  Result Value Ref Range   WBC 10.4 4.0 - 10.5 K/uL   RBC 3.88 3.87 - 5.11 MIL/uL   Hemoglobin 8.9 (L) 12.0 - 15.0 g/dL   HCT 31.4 (L) 36 - 46 %   MCV 80.9 80.0 - 100.0 fL   MCH 22.9 (L) 26.0 - 34.0 pg   MCHC 28.3 (L) 30.0 - 36.0 g/dL   RDW 17.7 (H) 11.5 - 15.5 %   Platelets 304 150 - 400 K/uL   nRBC 0.0 0.0 - 0.2 %  Basic metabolic panel     Status: Abnormal   Collection Time: 06/19/20  5:00 AM  Result Value Ref Range   Sodium 138 135 - 145 mmol/L   Potassium 5.7 (H) 3.5 - 5.1 mmol/L   Chloride 109 98 - 111 mmol/L   CO2 19 (L) 22 - 32 mmol/L   Glucose, Bld 105 (H) 70 - 99 mg/dL   BUN 49 (H) 8 - 23 mg/dL   Creatinine, Ser 2.42 (H) 0.44 - 1.00 mg/dL   Calcium 9.0 8.9 - 10.3 mg/dL   GFR calc non Af Amer 17 (L) >60 mL/min   GFR calc Af Amer 20 (L)  >60 mL/min   Anion gap 10 5 - 15  Basic metabolic panel     Status: Abnormal   Collection Time: 06/19/20  2:07 PM  Result Value Ref Range   Sodium 139 135 - 145 mmol/L   Potassium 5.5 (H) 3.5 - 5.1 mmol/L   Chloride 105 98 - 111 mmol/L   CO2 20 (L) 22 - 32 mmol/L   Glucose, Bld 114 (H) 70 - 99 mg/dL   BUN 47 (H) 8 - 23 mg/dL   Creatinine, Ser 2.11 (H) 0.44 - 1.00 mg/dL   Calcium 8.9 8.9 - 10.3 mg/dL   GFR calc non Af Amer 20 (L) >60 mL/min   GFR calc Af Amer 23 (L) >60 mL/min   Anion gap 14 5 - 15    Recent Results (from the past 240 hour(s))  SARS Coronavirus 2 by RT PCR (hospital order, performed in [place] hospital lab) Nasopharyngeal Nasopharyngeal Swab     Status: None   Collection Time: 06/18/20 11:36 PM   Specimen: Nasopharyngeal Swab  Result Value Ref Range Status   SARS Coronavirus 2 NEGATIVE NEGATIVE Final    Comment: (NOTE) SARS-CoV-2 target nucleic acids are NOT DETECTED.  The SARS-CoV-2 RNA is generally detectable in upper and lower respiratory specimens during the acute phase of infection. The lowest concentration of SARS-CoV-2 viral copies this assay can detect is 250 copies / mL. A negative result does not preclude SARS-CoV-2 infection and should not be used as the sole  basis for treatment or other patient management decisions.  A negative result may occur with improper specimen collection / handling, submission of specimen other than nasopharyngeal swab, presence of viral mutation(s) within the areas targeted by this assay, and inadequate number of viral copies (<250 copies / mL). A negative result must be combined with clinical observations, patient history, and epidemiological information.  Fact Sheet for Patients:   StrictlyIdeas.no  Fact Sheet for Healthcare Providers: BankingDealers.co.za  This test is not yet approved or  cleared by the [place] FDA and has been authorized for detection and/or  diagnosis of SARS-CoV-2 by FDA under an Emergency Use Authorization (EUA).  This EUA will remain in effect (meaning this test can be used) for the duration of the COVID-19 declaration under Section 564(b)(1) of the Act, 21 U.S.C. section 360bbb-3(b)(1), unless the authorization is terminated or revoked sooner.  Performed at [facility], [place] [address] [place], [place] [address]      Radiology Studies: CT ABDOMEN PELVIS WO CONTRAST  Result Date: 06/18/2020 CLINICAL DATA:  Left lower quadrant abdominal pain. EXAM: CT ABDOMEN AND PELVIS WITHOUT CONTRAST TECHNIQUE: Multidetector CT imaging of the abdomen and pelvis was performed following the standard protocol without IV contrast. COMPARISON:  February 10, 2020 FINDINGS: Lower chest: No acute abnormality. Hepatobiliary: No focal liver abnormality is seen. No gallstones, gallbladder wall thickening, or biliary dilatation. Pancreas: Unremarkable. No pancreatic ductal dilatation or surrounding inflammatory changes. Spleen: Normal in size without focal abnormality. Adrenals/Urinary Tract: Adrenal glands are unremarkable. Kidneys are normal in size, without focal lesions. Stable moderate severity left-sided hydronephrosis and hydroureter is seen. A mild-to-moderate amount of air is seen within the lumen of a partially contracted urinary bladder. This is seen on the prior exam mild diffuse urinary bladder wall thickening is again seen. This is most prominent along the anterolateral aspect of the bladder on the left and is stable in severity when compared to the prior study. Stomach/Bowel: There is a very large gastric hernia. The appendix is not identified. No evidence of bowel dilatation. Numerous diverticula are seen throughout the sigmoid colon. Persistent marked severity thickening of the sigmoid colon is seen. Vascular/Lymphatic: There is marked severity calcification of the abdominal aorta and bilateral common iliac arteries, without  evidence of aneurysmal dilatation. No enlarged abdominal or pelvic lymph nodes. Reproductive: The uterus is positioned to the right of midline and is otherwise normal in appearance. Predominant stable 3.3 cm x 2.0 cm, 1.5 cm x 1.3 cm and 1.5 cm x 1.2 cm cysts are seen along the anterior aspect of the left adnexa. Other: No abdominal wall hernia or abnormality. No abdominopelvic ascites. Musculoskeletal: Multilevel degenerative changes are again seen throughout the lumbar spine. IMPRESSION: 1. Marked severity diverticular disease throughout the sigmoid colon, unchanged in appearance when compared to the prior study. The presence of an underlying neoplastic process cannot be excluded. 2. Air within the urinary bladder lumen with stable asymmetric bladder wall thickening, consistent with the patient's known chronic colovesical fistula and subsequent cystitis. 3. Stable left adnexal cystic lesion. 4. Large gastric hernia. 5. Stable moderate severity left-sided hydronephrosis and hydroureter. Aortic Atherosclerosis (ICD10-I70.0). Electronically Signed   By: Virgina Norfolk M.D.   On: 06/18/2020 21:11   CT ABDOMEN PELVIS WO CONTRAST  Final Result      Scheduled Meds: . aspirin  81 mg Oral Daily  . enoxaparin (LOVENOX) injection  30 mg Subcutaneous Q24H  . gabapentin  100 mg Oral TID  . levothyroxine  75  mcg Oral Q0600  . metoprolol succinate  75 mg Oral Daily  . multivitamin with minerals  1 tablet Oral Daily  . olopatadine  1 drop Both Eyes Daily   PRN Meds: acetaminophen **OR** acetaminophen, ALPRAZolam, ondansetron **OR** ondansetron (ZOFRAN) IV Continuous Infusions: . sodium chloride 125 mL/hr at 06/19/20 0617  . meropenem (MERREM) IV 1 g (06/19/20 1223)      LOS: 1 day  Time spent: Greater than 50% of the 35 minute visit was spent in counseling/coordination of care for the patient as laid out in the A&P.   Dwyane Dee, MD [facility] 06/19/2020, 3:39 PM  Contact via secure  chat.  To contact the attending provider between 7A-7P or the covering provider during after hours 7P-7A, please log into the web site [email or web address] and access using universal [place] password for that web site. If you do not have the password, please call the hospital operator.

## 2020-06-19 NOTE — Assessment & Plan Note (Signed)
-   hold diuretics; on IVF in setting of AKI - monitor for s/s exacerbation

## 2020-06-19 NOTE — Assessment & Plan Note (Signed)
-   not on [facility] - continue lopressor

## 2020-06-19 NOTE — Assessment & Plan Note (Addendum)
-   followed outpatient; was to undergo workup for possible TAVR; she does not appear to want much surgery at her age - monitor for now and if improves will discuss further if she wants outpatient workup again for TAVR; discussed bedside with her on 06/20/20 and she is still not wanting any surgery and says she will talk with her sons also more about this when she goes home

## 2020-06-19 NOTE — Assessment & Plan Note (Signed)
-   chronic and unchanged on imaging

## 2020-06-19 NOTE — Assessment & Plan Note (Addendum)
-   tachycardia (127), tachypnea (20), AKI (creat 2.56 up from baseline 1), acute encephalopathy; source considered UTI/urinary 2/2 colovesical fistula with history of ESBL infection - continue IVF - continue meropenem; f/u cultures (Urine growing Klebsiella, see UTI); discharged on Vantin, see UTI

## 2020-06-19 NOTE — Assessment & Plan Note (Addendum)
Resolved with fluids °

## 2020-06-19 NOTE — Assessment & Plan Note (Signed)
-   see fistula and sepsis

## 2020-06-19 NOTE — Assessment & Plan Note (Signed)
-   continue lopressor

## 2020-06-19 NOTE — Assessment & Plan Note (Addendum)
-   likely associated with fistula - hx ESBL infection; follow-up pending cultures - UCx now growing Klebsiella; MIC high for first generation cephalosporins.  Will discharge on Vantin to complete total of 2-week course

## 2020-06-20 LAB — BASIC METABOLIC PANEL
Anion gap: 7 (ref 5–15)
BUN: 35 mg/dL — ABNORMAL HIGH (ref 8–23)
CO2: 20 mmol/L — ABNORMAL LOW (ref 22–32)
Calcium: 8.3 mg/dL — ABNORMAL LOW (ref 8.9–10.3)
Chloride: 109 mmol/L (ref 98–111)
Creatinine, Ser: 1.67 mg/dL — ABNORMAL HIGH (ref 0.44–1.00)
GFR calc Af Amer: 31 mL/min — ABNORMAL LOW (ref >60)
GFR calc non Af Amer: 27 mL/min — ABNORMAL LOW (ref >60)
Glucose, Bld: 99 mg/dL (ref 70–99)
Potassium: 4.7 mmol/L (ref 3.5–5.1)
Sodium: 136 mmol/L (ref 135–145)

## 2020-06-20 LAB — CBC WITH DIFFERENTIAL/PLATELET
Abs Immature Granulocytes: 0.03 10*3/uL (ref 0.00–0.07)
Basophils Absolute: 0.1 10*3/uL (ref 0.0–0.1)
Basophils Relative: 1 %
Eosinophils Absolute: 0.3 10*3/uL (ref 0.0–0.5)
Eosinophils Relative: 5 %
HCT: 27 % — ABNORMAL LOW (ref 36.0–46.0)
Hemoglobin: 7.7 g/dL — ABNORMAL LOW (ref 12.0–15.0)
Immature Granulocytes: 1 %
Lymphocytes Relative: 18 %
Lymphs Abs: 1.1 10*3/uL (ref 0.7–4.0)
MCH: 22.6 pg — ABNORMAL LOW (ref 26.0–34.0)
MCHC: 28.5 g/dL — ABNORMAL LOW (ref 30.0–36.0)
MCV: 79.2 fL — ABNORMAL LOW (ref 80.0–100.0)
Monocytes Absolute: 0.5 10*3/uL (ref 0.1–1.0)
Monocytes Relative: 9 %
Neutro Abs: 4.2 10*3/uL (ref 1.7–7.7)
Neutrophils Relative %: 66 %
Platelets: 276 10*3/uL (ref 150–400)
RBC: 3.41 MIL/uL — ABNORMAL LOW (ref 3.87–5.11)
RDW: 17.8 % — ABNORMAL HIGH (ref 11.5–15.5)
WBC: 6.3 10*3/uL (ref 4.0–10.5)
nRBC: 0 % (ref 0.0–0.2)

## 2020-06-20 LAB — MAGNESIUM: Magnesium: 1.8 mg/dL (ref 1.7–2.4)

## 2020-06-20 NOTE — Progress Notes (Signed)
PROGRESS NOTE    [name]   WNU:[id number]  DOB: 1927-04-28  DOA: 06/18/2020     2  PCP: [name], NP  CC: Abdominal pain  Hospital Course: [name] is a 86 year old female with PMH colovesical fistula, rheumatoid arthritis, IDA, hypothyroidism, hypertension, hyperlipidemia, moderate aortic stenosis, PAF (not on anticoagulation), CHF who presented to the hospital with abdominal pain.  She has had recurrent hospitalizations in the past for ongoing UTIs related to her fistula.  Last hospitalization was in April 2021 at which time she grew E. coli ESBL.  She has also declined surgical intervention for her fistula stating she does not want it due to her advanced age. She also has not considered palliative care/hospice. She underwent CT abdomen/pelvis in the ER which showed known severe diverticular disease.  Also revealed "Air within the urinary bladder lumen with stable asymmetric bladder wall thickening, consistent with the patient's known chronic colovesical fistula and subsequent cystitis.  Stable moderate severity Sydney-sided hydronephrosis and hydroureter."  She was started on meropenem given her history of ESBL E. coli.  Also started on fluids in setting of acute kidney injury.  She was also evaluated by physical therapy and followed daily.   Interval History:  No events overnight.  Complains of some lower abdominal pain this morning and states that it does burn when she voids.  Discussed with her that this is due to her underlying fistula and explained that surgery would be the recommended treatment; she is still against any surgery at this time.  We also discussed previous work-up for TAVR in the past and she is also still not wanting to consider any surgery, and says that she will talk with her sons more at home after discharge. Denies feeling feverish nor any chills.  Old records reviewed in assessment of this patient  ROS: Constitutional: negative for  chills and fevers, Respiratory: negative for cough, Cardiovascular: negative for chest pain and Gastrointestinal: positive for abdominal pain   Assessment & Plan: Persistent atrial fibrillation (HCC) - not on AC - continue lopressor  Benign essential hypertension - continue lopressor  Hydronephrosis - see fistula and sepsis  [name] - chronic and unchanged on imaging  Chronic combined systolic and diastolic congestive heart failure (HCC) - hold diuretics; on IVF in setting of AKI - monitor for s/s exacerbation   Aortic stenosis - followed outpatient; was to undergo workup for possible TAVR; she does not appear to want much surgery at her age - monitor for now and if improves will discuss further if she wants outpatient workup again for TAVR; discussed bedside with her on 06/20/20 and she is still not wanting any surgery and says she will talk with her sons also more about this when she goes home   AKI (acute kidney injury) ([place]) - continue IVF and monitor response  - BMP in am   UTI (urinary tract infection) - likely associated with fistula - hx ESBL infection; follow-up pending cultures - UCx now growing Klebsiella; follow up for further sens - continue meropenem, de-escalate pending further culture results   Colovesical fistula -Patient has known fistula.  She has declined surgical intervention in context of her advanced age she says -She has been seen by urology in the past, will need to clarify if she needs to be on long-term suppressive therapy; follows with Dr. Jeffie Pollock, will reach out after the weekend  Severe sepsis with acute organ dysfunction ([place]) - tachycardia (127), tachypnea (20), AKI (creat 2.56  up from baseline 1), acute encephalopathy; source considered UTI/urinary 2/2 colovesical fistula with history of ESBL infection - continue IVF - continue meropenem; f/u cultures (Urine growing Klebsiella, see UTI); narrow as able  -If fever obtain blood  cultures   Antimicrobials: Meropenem 06/18/2020>> present  DVT prophylaxis: Lovenox Code Status: Full Family Communication: None present Disposition Plan: Status is: Inpatient  Remains inpatient appropriate because:Altered mental status, Unsafe d/c plan, IV treatments appropriate due to intensity of illness or inability to take PO and Inpatient level of care appropriate due to severity of illness   Dispo: The patient is from: Home              Anticipated d/c is to: Pending PT eval              Anticipated d/c date is: 3 days              Patient currently is not medically stable to d/c.  Objective: Blood pressure 122/79, pulse 76, temperature 98.1 F (36.7 C), temperature source Oral, resp. rate 20, height 5' (1.524 m), weight 51.3 kg, SpO2 95 %.  Examination: General appearance: Pleasant elderly woman resting in bed in no distress Head: Normocephalic, without obvious abnormality, atraumatic Eyes: EOMI Lungs: clear to auscultation bilaterally Heart: regular rate and rhythm and S1, S2 normal Abdomen: Minimal tenderness to palpation in lower abdominal fields, no rebound or guarding.  Bowel sounds present Extremities: No edema Skin: mobility and turgor normal Neurologic: Grossly normal  Consultants:   None  Procedures:   None  Data Reviewed: I have personally reviewed following labs and imaging studies Results for orders placed or performed during the hospital encounter of 06/18/20 (from the past 24 hour(s))  Basic metabolic panel     Status: Abnormal   Collection Time: 06/19/20  2:07 PM  Result Value Ref Range   Sodium 139 135 - 145 mmol/L   Potassium 5.5 (H) 3.5 - 5.1 mmol/L   Chloride 105 98 - 111 mmol/L   CO2 20 (L) 22 - 32 mmol/L   Glucose, Bld 114 (H) 70 - 99 mg/dL   BUN 47 (H) 8 - 23 mg/dL   Creatinine, Ser 2.11 (H) 0.44 - 1.00 mg/dL   Calcium 8.9 8.9 - 10.3 mg/dL   GFR calc non Af Amer 20 (L) >60 mL/min   GFR calc Af Amer 23 (L) >60 mL/min   Anion gap 14  5 - 15  Basic metabolic panel     Status: Abnormal   Collection Time: 06/20/20  5:34 AM  Result Value Ref Range   Sodium 136 135 - 145 mmol/L   Potassium 4.7 3.5 - 5.1 mmol/L   Chloride 109 98 - 111 mmol/L   CO2 20 (L) 22 - 32 mmol/L   Glucose, Bld 99 70 - 99 mg/dL   BUN 35 (H) 8 - 23 mg/dL   Creatinine, Ser 1.67 (H) 0.44 - 1.00 mg/dL   Calcium 8.3 (L) 8.9 - 10.3 mg/dL   GFR calc non Af Amer 27 (L) >60 mL/min   GFR calc Af Amer 31 (L) >60 mL/min   Anion gap 7 5 - 15  CBC with Differential/Platelet     Status: Abnormal   Collection Time: 06/20/20  5:34 AM  Result Value Ref Range   WBC 6.3 4.0 - 10.5 K/uL   RBC 3.41 (L) 3.87 - 5.11 MIL/uL   Hemoglobin 7.7 (L) 12.0 - 15.0 g/dL   HCT 27.0 (L) 36 - 46 %  MCV 79.2 (L) 80.0 - 100.0 fL   MCH 22.6 (L) 26.0 - 34.0 pg   MCHC 28.5 (L) 30.0 - 36.0 g/dL   RDW 17.8 (H) 11.5 - 15.5 %   Platelets 276 150 - 400 K/uL   nRBC 0.0 0.0 - 0.2 %   Neutrophils Relative % 66 %   Neutro Abs 4.2 1.7 - 7.7 K/uL   Lymphocytes Relative 18 %   Lymphs Abs 1.1 0.7 - 4.0 K/uL   Monocytes Relative 9 %   Monocytes Absolute 0.5 0 - 1 K/uL   Eosinophils Relative 5 %   Eosinophils Absolute 0.3 0 - 0 K/uL   Basophils Relative 1 %   Basophils Absolute 0.1 0 - 0 K/uL   Immature Granulocytes 1 %   Abs Immature Granulocytes 0.03 0.00 - 0.07 K/uL  Magnesium     Status: None   Collection Time: 06/20/20  5:34 AM  Result Value Ref Range   Magnesium 1.8 1.7 - 2.4 mg/dL    Recent Results (from the past 240 hour(s))  Culture, Urine     Status: Abnormal (Preliminary result)   Collection Time: 06/18/20  9:46 PM   Specimen: Urine, Random  Result Value Ref Range Status   Specimen Description   Final    URINE, RANDOM Performed at [facility], [place] [address] [place], [place] [address]    Special Requests   Final    NONE Performed at [facility], [place] [address] [place], [place] [address]    Culture (A)  Final    >=100,000  COLONIES/mL KLEBSIELLA OXYTOCA SUSCEPTIBILITIES TO FOLLOW Performed at [facility], [place] [address] [place], [place] [address]    Report Status PENDING  Incomplete  SARS Coronavirus 2 by RT PCR (hospital order, performed in [facility] hospital lab) Nasopharyngeal Nasopharyngeal Swab     Status: None   Collection Time: 06/18/20 11:36 PM   Specimen: Nasopharyngeal Swab  Result Value Ref Range Status   SARS Coronavirus 2 NEGATIVE NEGATIVE Final    Comment: (NOTE) SARS-CoV-2 target nucleic acids are NOT DETECTED.  The SARS-CoV-2 RNA is generally detectable in upper and lower respiratory specimens during the acute phase of infection. The lowest concentration of SARS-CoV-2 viral copies this assay can detect is 250 copies / mL. A negative result does not preclude SARS-CoV-2 infection and should not be used as the sole basis for treatment or other patient management decisions.  A negative result may occur with improper specimen collection / handling, submission of specimen other than nasopharyngeal swab, presence of viral mutation(s) within the areas targeted by this assay, and inadequate number of viral copies (<250 copies / mL). A negative result must be combined with clinical observations, patient history, and epidemiological information.  Fact Sheet for Patients:   StrictlyIdeas.no  Fact Sheet for Healthcare Providers: BankingDealers.co.za  This test is not yet approved or  cleared by the [place] FDA and has been authorized for detection and/or diagnosis of SARS-CoV-2 by FDA under an Emergency Use Authorization (EUA).  This EUA will remain in effect (meaning this test can be used) for the duration of the COVID-19 declaration under Section 564(b)(1) of the Act, 21 U.S.C. section 360bbb-3(b)(1), unless the authorization is terminated or revoked sooner.  Performed at [facility], [place] [address], [place], [place] [address]      Radiology Studies: CT ABDOMEN PELVIS WO CONTRAST  Result Date: 06/18/2020 CLINICAL DATA:  Sydney lower quadrant abdominal pain.  EXAM: CT ABDOMEN AND PELVIS WITHOUT CONTRAST TECHNIQUE: Multidetector CT imaging of the abdomen and pelvis was performed following the standard protocol without IV contrast. COMPARISON:  February 10, 2020 FINDINGS: Lower chest: No acute abnormality. Hepatobiliary: No focal liver abnormality is seen. No gallstones, gallbladder wall thickening, or biliary dilatation. Pancreas: Unremarkable. No pancreatic ductal dilatation or surrounding inflammatory changes. Spleen: Normal in size without focal abnormality. Adrenals/Urinary Tract: Adrenal glands are unremarkable. Kidneys are normal in size, without focal lesions. Stable moderate severity Sydney-sided hydronephrosis and hydroureter is seen. A mild-to-moderate amount of air is seen within the lumen of a partially contracted urinary bladder. This is seen on the prior exam mild diffuse urinary bladder wall thickening is again seen. This is most prominent along the anterolateral aspect of the bladder on the Sydney and is stable in severity when compared to the prior study. Stomach/Bowel: There is a very large gastric hernia. The appendix is not identified. No evidence of bowel dilatation. Numerous diverticula are seen throughout the sigmoid colon. Persistent marked severity thickening of the sigmoid colon is seen. Vascular/Lymphatic: There is marked severity calcification of the abdominal aorta and bilateral common iliac arteries, without evidence of aneurysmal dilatation. No enlarged abdominal or pelvic lymph nodes. Reproductive: The uterus is positioned to the right of midline and is otherwise normal in appearance. Predominant stable 3.3 cm x 2.0 cm, 1.5 cm x 1.3 cm and 1.5 cm x 1.2 cm cysts are seen along the anterior aspect of the Sydney adnexa. Other: No abdominal wall hernia or abnormality. No abdominopelvic  ascites. Musculoskeletal: Multilevel degenerative changes are again seen throughout the lumbar spine. IMPRESSION: 1. Marked severity diverticular disease throughout the sigmoid colon, unchanged in appearance when compared to the prior study. The presence of an underlying neoplastic process cannot be excluded. 2. Air within the urinary bladder lumen with stable asymmetric bladder wall thickening, consistent with the patient's known chronic colovesical fistula and subsequent cystitis. 3. Stable Sydney adnexal cystic lesion. 4. Large gastric hernia. 5. Stable moderate severity Sydney-sided hydronephrosis and hydroureter. Aortic Atherosclerosis (ICD10-I70.0). Electronically Signed   By: Virgina Norfolk M.D.   On: 06/18/2020 21:11   CT ABDOMEN PELVIS WO CONTRAST  Final Result      Scheduled Meds: . aspirin  81 mg Oral Daily  . enoxaparin (LOVENOX) injection  30 mg Subcutaneous Q24H  . gabapentin  100 mg Oral TID  . levothyroxine  75 mcg Oral Q0600  . metoprolol succinate  75 mg Oral Daily  . multivitamin with minerals  1 tablet Oral Daily  . olopatadine  1 drop Both Eyes Daily   PRN Meds: acetaminophen **OR** acetaminophen, ALPRAZolam, ondansetron **OR** ondansetron (ZOFRAN) IV Continuous Infusions: . sodium chloride 125 mL/hr at 06/20/20 1211  . meropenem (MERREM) IV 1 g (06/20/20 1213)      LOS: 2 days  Time spent: Greater than 50% of the 35 minute visit was spent in counseling/coordination of care for the patient as laid out in the A&P.   Dwyane Dee, MD [facility] 06/20/2020, 12:15 PM  Contact via secure chat.  To contact the attending provider between 7A-7P or the covering provider during after hours 7P-7A, please log into the web site [email or web address] and access using universal [place] password for that web site. If you do not have the password, please call the hospital operator.

## 2020-06-20 NOTE — Evaluation (Signed)
Physical Therapy Evaluation Patient Details Name: [name] MRN: [id number] DOB: May 12, 1927 Today's Date: 06/20/2020   History of Present Illness  86 year old female with PMH colovesical fistula, rheumatoid arthritis, IDA, hypothyroidism, hypertension, hyperlipidemia, moderate aortic stenosis, PAF, CHF who presented to the hospital with abdominal pain.  She has had recurrent hospitalizations in the past for ongoing UTIs related to her fistula.   admitted with sepsis.  Clinical Impression  Pt admitted with above diagnosis.  Pt agreeable to OOB to chair, having some nausea since she ate breakfast and therefore declined further activity. Pt will likely be able to d/c home with son who is her caregiver.  May benefit from HHPT vs no f/u pending progress in acute setting   Pt currently with functional limitations due to the deficits listed below (see PT Problem List). Pt will benefit from skilled PT to increase their independence and safety with mobility to allow discharge to the venue listed below.       Follow Up Recommendations Follow surgeon's recommendation for DC plan and follow-up therapies    Equipment Recommendations  None recommended by PT    Recommendations for Other Services       Precautions / Restrictions Precautions Precautions: Fall Restrictions Weight Bearing Restrictions: No      Mobility  Bed Mobility Overal bed mobility: Needs Assistance Bed Mobility: Supine to Sit     Supine to sit: Supervision;Min guard     General bed mobility comments: for safety  Transfers Overall transfer level: Needs assistance Equipment used: None Transfers: Sit to/from [employer] Transfers Sit to Stand: Min guard Stand pivot transfers: Min guard;Min assist       General transfer comment: cues for safe transition, min to steady. activity limited by nausea  Ambulation/Gait             General Gait Details: deferred d/t nausea  Stairs             Wheelchair Mobility    Modified Rankin (Stroke Patients Only)       Balance Overall balance assessment: Needs assistance Sitting-balance support: Feet supported;No upper extremity supported Sitting balance-Leahy Scale: Good       Standing balance-Leahy Scale: Fair Standing balance comment: unilateral UE support for dynamic activity                             Pertinent Vitals/Pain Pain Assessment: Faces Faces Pain Scale: Hurts little more Pain Location: perineal region Pain Descriptors / Indicators: Burning Pain Intervention(s): Monitored during session    Home Living Family/patient expects to be discharged to:: Private residence Living Arrangements: Children Available Help at Discharge: Family;Available 24 hours/day (son) Type of Home: House Home Access: Level entry     Home Layout: One level Home Equipment: Walker - 2 wheels;Shower seat - built in;Walker - 4 wheels      Prior Function Level of Independence: Needs assistance;Independent with assistive device(s)   Gait / Transfers Assistance Needed: amb without device inside home, uses rollator for longer distances however hasn't been going out much on the advice of her son d/t covid  ADL's / Homemaking Assistance Needed: independent with bath, dress, feed; son does cook, clean, laundry, grocery shopping  Comments: no longer drives     Hand Dominance        Extremity/Trunk Assessment   Upper Extremity Assessment Upper Extremity Assessment: Generalized weakness;Defer to OT evaluation    Lower Extremity Assessment Lower Extremity Assessment: Generalized weakness  Communication   Communication: No difficulties  Cognition Arousal/Alertness: Awake/alert Behavior During Therapy: WFL for tasks assessed/performed Overall Cognitive Status: Within Functional Limits for tasks assessed                                 General Comments: pt alert and oriented at time of PT eval       General Comments      Exercises     Assessment/Plan    PT Assessment Patient needs continued PT services  PT Problem List Decreased strength;Decreased mobility;Decreased activity tolerance;Decreased knowledge of use of DME;Decreased balance       PT Treatment Interventions DME instruction;Therapeutic exercise;Gait training;Functional mobility training;Therapeutic activities;Patient/family education    PT Goals (Current goals can be found in the Care Plan section)  Acute Rehab PT Goals Patient Stated Goal: home with son PT Goal Formulation: With patient Time For Goal Achievement: 07/04/20 Potential to Achieve Goals: Good    Frequency Min 3X/week   Barriers to discharge        Co-evaluation               AM-PAC PT "6 Clicks" Mobility  Outcome Measure Help needed turning from your back to your side while in a flat bed without using bedrails?: A Little Help needed moving from lying on your back to sitting on the side of a flat bed without using bedrails?: A Little Help needed moving to and from a bed to a chair (including a wheelchair)?: A Little Help needed standing up from a chair using your arms (e.g., wheelchair or bedside chair)?: A Little Help needed to walk in hospital room?: A Little Help needed climbing 3-5 steps with a railing? : A Little 6 Click Score: 18    End of Session Equipment Utilized During Treatment: Gait belt Activity Tolerance: Patient tolerated treatment well;Other (comment) (limited by nausea) Patient left: with call bell/phone within reach;in chair;with chair alarm set   PT Visit Diagnosis: Difficulty in walking, not elsewhere classified (R26.2);Muscle weakness (generalized) (M62.81)    Time: 4034-7425 PT Time Calculation (min) (ACUTE ONLY): 15 min   Charges:   PT Evaluation $PT Eval Low Complexity: [place], PT  Acute Rehab Dept ([place]) [phone] Pager [phone]  06/20/2020   [facility] 06/20/2020,  10:48 AM

## 2020-06-21 ENCOUNTER — Ambulatory Visit: Payer: Self-pay | Admitting: *Deleted

## 2020-06-21 ENCOUNTER — Telehealth: Payer: Medicare Other | Admitting: Nurse Practitioner

## 2020-06-21 DIAGNOSIS — N390 Urinary tract infection, site not specified: Secondary | ICD-10-CM

## 2020-06-21 LAB — BASIC METABOLIC PANEL
Anion gap: 10 (ref 5–15)
BUN: 29 mg/dL — ABNORMAL HIGH (ref 8–23)
CO2: 17 mmol/L — ABNORMAL LOW (ref 22–32)
Calcium: 8.3 mg/dL — ABNORMAL LOW (ref 8.9–10.3)
Chloride: 111 mmol/L (ref 98–111)
Creatinine, Ser: 1.46 mg/dL — ABNORMAL HIGH (ref 0.44–1.00)
GFR calc Af Amer: 36 mL/min — ABNORMAL LOW (ref >60)
GFR calc non Af Amer: 31 mL/min — ABNORMAL LOW (ref >60)
Glucose, Bld: 97 mg/dL (ref 70–99)
Potassium: 4.5 mmol/L (ref 3.5–5.1)
Sodium: 138 mmol/L (ref 135–145)

## 2020-06-21 LAB — CBC WITH DIFFERENTIAL/PLATELET
Abs Immature Granulocytes: 0.03 10*3/uL (ref 0.00–0.07)
Basophils Absolute: 0.1 10*3/uL (ref 0.0–0.1)
Basophils Relative: 1 %
Eosinophils Absolute: 0.3 10*3/uL (ref 0.0–0.5)
Eosinophils Relative: 4 %
HCT: 26.3 % — ABNORMAL LOW (ref 36.0–46.0)
Hemoglobin: 7.5 g/dL — ABNORMAL LOW (ref 12.0–15.0)
Immature Granulocytes: 0 %
Lymphocytes Relative: 24 %
Lymphs Abs: 1.8 10*3/uL (ref 0.7–4.0)
MCH: 22.9 pg — ABNORMAL LOW (ref 26.0–34.0)
MCHC: 28.5 g/dL — ABNORMAL LOW (ref 30.0–36.0)
MCV: 80.4 fL (ref 80.0–100.0)
Monocytes Absolute: 0.7 10*3/uL (ref 0.1–1.0)
Monocytes Relative: 9 %
Neutro Abs: 4.7 10*3/uL (ref 1.7–7.7)
Neutrophils Relative %: 62 %
Platelets: 243 10*3/uL (ref 150–400)
RBC: 3.27 MIL/uL — ABNORMAL LOW (ref 3.87–5.11)
RDW: 18.3 % — ABNORMAL HIGH (ref 11.5–15.5)
WBC: 7.4 10*3/uL (ref 4.0–10.5)
nRBC: 0 % (ref 0.0–0.2)

## 2020-06-21 LAB — URINE CULTURE: Culture: >=100000 — AB

## 2020-06-21 LAB — MAGNESIUM: Magnesium: 1.6 mg/dL — ABNORMAL LOW (ref 1.7–2.4)

## 2020-06-21 MED ORDER — CEPHALEXIN 500 MG PO CAPS
500.0000 mg | ORAL_CAPSULE | Freq: Two times a day (BID) | ORAL | Status: DC
  Administered 2020-06-21: 500 mg via ORAL
  Filled 2020-06-21: qty 1

## 2020-06-21 MED ORDER — CEFPODOXIME PROXETIL 200 MG PO TABS: 200.0000 mg | ORAL_TABLET | Freq: Two times a day (BID) | ORAL | 0 refills | Status: AC

## 2020-06-21 MED ORDER — MAGNESIUM OXIDE 400 (241.3 MG) MG PO TABS
800.0000 mg | ORAL_TABLET | Freq: Once | ORAL | Status: AC
  Administered 2020-06-21: 800 mg via ORAL
  Filled 2020-06-21: qty 2

## 2020-06-21 NOTE — Progress Notes (Signed)
Pharmacy Antibiotic Note  [name] is a 86 y.o. female admitted on 06/18/2020 with UTI.  Pharmacy has been consulted for Meropenem dosing.   Hx ESBL UTI in April.  Afebrile, WBC WNL AKI- baseline Scr ~1  Plan: Continue meropenem 1gm IV q12h Monitor renal function and cx data    Height: 5' (152.4 cm) Weight: 51.3 kg (113 lb) IBW/kg (Calculated) : 45.5  Temp (24hrs), Avg:98.9 F (37.2 C), Min:98 F (36.7 C), Max:100.3 F (37.9 C)  Recent Labs  Lab 06/18/20 2146 06/19/20 0500 06/19/20 1407 06/20/20 0534 06/21/20 0533  WBC 10.1 10.4  --  6.3 7.4  CREATININE 2.56* 2.42* 2.11* 1.67* 1.46*    Estimated Creatinine Clearance: 18.4 mL/min (A) (by C-G formula based on SCr of 1.46 mg/dL (H)).    Allergies  Allergen Reactions  . Infliximab Other (See Comments)    REACTION: "Enlarged intestines and hernia. Also pushed intestines on lower part of lungs." REACTION: "Enlarged intestines and hernia. Also pushed intestines on lower part of lungs."  . Aspirin Nausea And Vomiting    Low Dose is ok Stomach problems  . Carvedilol Other (See Comments)    Fatigue/ ill Fatigue/ ill  . Codeine Nausea And Vomiting    "Deathly Sick"  . Penicillins Rash    Has patient had a PCN reaction causing immediate rash, facial/tongue/throat swelling, SOB or lightheadedness with hypotension: Y Has patient had a PCN reaction causing severe rash involving mucus membranes or skin necrosis: Y Has patient had a PCN reaction that required hospitalization: N Has patient had a PCN reaction occurring within the last 10 years: N If all of the above answers are "NO", then may proceed with Cephalosporin use.     Antimicrobials this admission: 8/20 Meropenem >>   Dose adjustments this admission:  Microbiology results: 8/20 UCx: KLebsiella oxytoca  02/10/20 UCx: ESBL Ecoli  Thank you for allowing pharmacy to be a part of this patient's care.  Ulice Dash D PharmD, BCPS 06/21/2020 7:45 AM

## 2020-06-21 NOTE — Discharge Instructions (Signed)
Follow up with urology to discuss long term antibiotics after discharge.

## 2020-06-21 NOTE — Care Management Important Message (Signed)
Important Message  Patient Details IM Letter given to the Patient Name: [name] MRN: [id number] Date of Birth: 1927/03/02   Medicare Important Message Given:  Yes     Kerin Salen 06/21/2020, 9:45 AM

## 2020-06-21 NOTE — Discharge Summary (Signed)
Physician Discharge Summary  [name] TKZ:[id number] DOB: 1927-09-10 DOA: 06/18/2020  PCP: [name], NP  Admit date: 06/18/2020 Discharge date: 06/21/2020  Admitted From: home Disposition:  home Discharging physician: Dwyane Dee, MD  Recommendations for Outpatient Follow-up:  1. Follow-up with urology  Patient discharged to home in Discharge Condition: stable CODE STATUS: Full Diet recommendation:  Diet Orders (From admission, onward)    Start     Ordered   06/18/20 2312  Diet Heart Room service appropriate? Yes; Fluid consistency: Thin  Diet effective now       Question Answer Comment  Room service appropriate? Yes   Fluid consistency: Thin      06/18/20 2311          Hospital Course: [name] is a 86 year old female with PMH colovesical fistula, rheumatoid arthritis, IDA, hypothyroidism, hypertension, hyperlipidemia, moderate aortic stenosis, PAF (not on anticoagulation), CHF who presented to the hospital with abdominal pain.  She has had recurrent hospitalizations in the past for ongoing UTIs related to her fistula.  Last hospitalization was in April 2021 at which time she grew E. coli ESBL.  She has also declined surgical intervention for her fistula stating she does not want it due to her advanced age. She also has not considered palliative care/hospice. She underwent CT abdomen/pelvis in the ER which showed known severe diverticular disease.  Also revealed "Air within the urinary bladder lumen with stable asymmetric bladder wall thickening, consistent with the patient's known chronic colovesical fistula and subsequent cystitis.  Stable moderate severity left-sided hydronephrosis and hydroureter."  She was started on meropenem given her history of ESBL E. coli.  Also started on fluids in setting of acute kidney injury.  She was also evaluated by physical therapy and followed daily.  Urine cultures returned with Klebsiella.  She was deescalated down  to Vantin to complete a total of 14-day course of antibiotics at discharge for complicated UTI.  She was informed that she likely would have recurrent infections with her ongoing untreated colovesical fistula.  She again stated prior to discharge that she had discussed with her son, and they are electing to continue to avoid any surgical intervention at this time.  She understands recommendations for returning to the hospital including recurrent fevers, worsening abdominal pain, and worsening dysuria.  Prescription sent to her pharmacy at time of discharge.   Persistent atrial fibrillation (HCC) - not on AC - continue lopressor  Benign essential hypertension - continue lopressor  Hydronephrosis - see fistula and sepsis  Left ovarian cyst - chronic and unchanged on imaging  Chronic combined systolic and diastolic congestive heart failure (HCC) - hold diuretics; on IVF in setting of AKI - monitor for s/s exacerbation   Aortic stenosis - followed outpatient; was to undergo workup for possible TAVR; she does not appear to want much surgery at her age - monitor for now and if improves will discuss further if she wants outpatient workup again for TAVR; discussed bedside with her on 06/20/20 and she is still not wanting any surgery and says she will talk with her sons also more about this when she goes home   AKI (acute kidney injury) ([place]) -Resolved with fluids  Complicated UTI (urinary tract infection) - likely associated with fistula - hx ESBL infection; follow-up pending cultures - UCx now growing Klebsiella; MIC high for first generation cephalosporins.  Will discharge on Vantin to complete total of 2-week course  Colovesical fistula -Patient has known fistula.  She has declined  surgical intervention in context of her advanced age she says -Outpatient follow-up with urology to discuss chronic suppressive antibiotic therapy  Severe sepsis with acute organ dysfunction (HCC) -  tachycardia (127), tachypnea (20), AKI (creat 2.56 up from baseline 1), acute encephalopathy; source considered UTI/urinary 2/2 colovesical fistula with history of ESBL infection - continue IVF - continue meropenem; f/u cultures (Urine growing Klebsiella, see UTI); discharged on Vantin, see UTI    The patient's chronic medical conditions were treated accordingly per the patient's home medication regimen except as noted.  On day of discharge, patient was felt deemed stable for discharge. Patient/family member advised to call PCP or come back to ER if needed.   Discharge Diagnoses:   Principal Diagnosis: Severe sepsis with acute organ dysfunction [facility])  Active Hospital Problems   Diagnosis Date Noted  . Complicated UTI (urinary tract infection) 08/21/2019    Priority: High  . Colovesical fistula 05/28/2019    Priority: High  . Chronic combined systolic and diastolic congestive heart failure ([place]) 04/21/2020  . Hydronephrosis 10/13/2018  . Left ovarian cyst   . Benign essential hypertension 09/02/2014  . Persistent atrial fibrillation ([place]) 05/11/2014  . Aortic stenosis 03/22/2011    Resolved Hospital Problems   Diagnosis Date Noted Date Resolved  . Severe sepsis with acute organ dysfunction [facility]) 06/19/2020 06/21/2020    Priority: High  . AKI (acute kidney injury) ([place]) 02/22/2020 06/21/2020    Priority: Medium    Discharge Instructions    Increase activity slowly   Complete by: As directed      Allergies as of 06/21/2020      Reactions   Infliximab Other (See Comments)   REACTION: "Enlarged intestines and hernia. Also pushed intestines on lower part of lungs." REACTION: "Enlarged intestines and hernia. Also pushed intestines on lower part of lungs."   Aspirin Nausea And Vomiting   Low Dose is ok Stomach problems   Carvedilol Other (See Comments)   Fatigue/ ill Fatigue/ ill   Codeine Nausea And Vomiting   "Deathly Sick"   Penicillins Rash   Has patient had a PCN  reaction causing immediate rash, facial/tongue/throat swelling, SOB or lightheadedness with hypotension: Y Has patient had a PCN reaction causing severe rash involving mucus membranes or skin necrosis: Y Has patient had a PCN reaction that required hospitalization: N Has patient had a PCN reaction occurring within the last 10 years: N If all of the above answers are "NO", then may proceed with Cephalosporin use.      Medication List    TAKE these medications   acetaminophen 500 MG tablet Commonly known as: TYLENOL Take 1,000 mg by mouth every 8 (eight) hours as needed (joint pain).   ALPRAZolam 0.25 MG tablet Commonly known as: XANAX Take 1 tablet (0.25 mg total) by mouth at bedtime as needed for anxiety. FOR ANXIETY/RESTLESS LEGS   aspirin 81 MG chewable tablet Chew 1 tablet (81 mg total) by mouth daily.   cefpodoxime 200 MG tablet Commonly known as: VANTIN Take 1 tablet (200 mg total) by mouth 2 (two) times daily for 12 days.   furosemide 20 MG tablet Commonly known as: LASIX Take 1 tablet (20 mg total) by mouth daily as needed.   gabapentin 100 MG capsule Commonly known as: NEURONTIN Take 100 mg by mouth 3 (three) times daily.   levothyroxine 75 MCG tablet Commonly known as: SYNTHROID TAKE 1 TABLET BY MOUTH ONCE DAILY BEFORE BREAKFAST   metoprolol succinate 25 MG 24 hr tablet Commonly known  as: TOPROL-XL Take 75 mg by mouth daily.   multivitamin with minerals Tabs tablet Take 1 tablet by mouth daily.   Olopatadine HCl 0.2 % Soln Place 1 drop into both eyes daily.   potassium chloride SA 20 MEQ tablet Commonly known as: KLOR-CON Take 1 tablet (20 mEq total) by mouth daily as needed.       Follow-up Information    Nche, Charlene Brooke, NP. Schedule an appointment as soon as possible for a visit in 1 week.   Specialty: Internal Medicine Why: If symptoms worsen Contact information: [place] [address] [phone]        Irine Seal, MD. Schedule an appointment as soon as possible for a visit in 1 week(s).   Specialty: Urology Contact information: [address] [place] [address] [phone]              Allergies  Allergen Reactions  . Infliximab Other (See Comments)    REACTION: "Enlarged intestines and hernia. Also pushed intestines on lower part of lungs." REACTION: "Enlarged intestines and hernia. Also pushed intestines on lower part of lungs."  . Aspirin Nausea And Vomiting    Low Dose is ok Stomach problems  . Carvedilol Other (See Comments)    Fatigue/ ill Fatigue/ ill  . Codeine Nausea And Vomiting    "Deathly Sick"  . Penicillins Rash    Has patient had a PCN reaction causing immediate rash, facial/tongue/throat swelling, SOB or lightheadedness with hypotension: Y Has patient had a PCN reaction causing severe rash involving mucus membranes or skin necrosis: Y Has patient had a PCN reaction that required hospitalization: N Has patient had a PCN reaction occurring within the last 10 years: N If all of the above answers are "NO", then may proceed with Cephalosporin use.     Discharge Exam: BP 125/68 (BP Location: Right Arm)   Pulse 74   Temp 98.5 F (36.9 C) (Oral)   Resp 14   Ht 5' (1.524 m)   Wt 51.3 kg   SpO2 95%   BMI 22.07 kg/m  General appearance: Pleasant elderly woman resting in bed in no distress Head: Normocephalic, without obvious abnormality, atraumatic Eyes: EOMI Lungs: clear to auscultation bilaterally Heart: regular rate and rhythm and S1, S2 normal Abdomen: Minimal tenderness to palpation in lower abdominal fields, no rebound or guarding.  Bowel sounds present Extremities: No edema Skin: mobility and turgor normal Neurologic: Grossly normal  The results of significant diagnostics from this hospitalization (including imaging, microbiology, ancillary and laboratory) are listed below for reference.   Microbiology: Recent Results (from the past 240 hour(s))   Culture, Urine     Status: Abnormal   Collection Time: 06/18/20  9:46 PM   Specimen: Urine, Random  Result Value Ref Range Status   Specimen Description   Final    URINE, RANDOM Performed at [facility] [address], [place], [place] [address]    Special Requests   Final    NONE Performed at [facility], [place] [address] [place], [place] [address]    Culture >=100,000 COLONIES/mL KLEBSIELLA OXYTOCA (A)  Final   Report Status 06/21/2020 FINAL  Final   Organism ID, Bacteria KLEBSIELLA OXYTOCA (A)  Final      Susceptibility   Klebsiella oxytoca - MIC*    AMPICILLIN >=32 RESISTANT Resistant     CEFAZOLIN 8 SENSITIVE Sensitive     CEFTRIAXONE <=0.25 SENSITIVE Sensitive     CIPROFLOXACIN <=0.25  SENSITIVE Sensitive     GENTAMICIN <=1 SENSITIVE Sensitive     IMIPENEM <=0.25 SENSITIVE Sensitive     NITROFURANTOIN 32 SENSITIVE Sensitive     TRIMETH/SULFA <=20 SENSITIVE Sensitive     AMPICILLIN/SULBACTAM 16 INTERMEDIATE Intermediate     PIP/TAZO <=4 SENSITIVE Sensitive     * >=100,000 COLONIES/mL KLEBSIELLA OXYTOCA  SARS Coronavirus 2 by RT PCR (hospital order, performed in [place] hospital lab) Nasopharyngeal Nasopharyngeal Swab     Status: None   Collection Time: 06/18/20 11:36 PM   Specimen: Nasopharyngeal Swab  Result Value Ref Range Status   SARS Coronavirus 2 NEGATIVE NEGATIVE Final    Comment: (NOTE) SARS-CoV-2 target nucleic acids are NOT DETECTED.  The SARS-CoV-2 RNA is generally detectable in upper and lower respiratory specimens during the acute phase of infection. The lowest concentration of SARS-CoV-2 viral copies this assay can detect is 250 copies / mL. A negative result does not preclude SARS-CoV-2 infection and should not be used as the sole basis for treatment or other patient management decisions.  A negative result may occur with improper specimen collection / handling, submission of specimen other than nasopharyngeal  swab, presence of viral mutation(s) within the areas targeted by this assay, and inadequate number of viral copies (<250 copies / mL). A negative result must be combined with clinical observations, patient history, and epidemiological information.  Fact Sheet for Patients:   StrictlyIdeas.no  Fact Sheet for Healthcare Providers: BankingDealers.co.za  This test is not yet approved or  cleared by the [place] FDA and has been authorized for detection and/or diagnosis of SARS-CoV-2 by FDA under an Emergency Use Authorization (EUA).  This EUA will remain in effect (meaning this test can be used) for the duration of the COVID-19 declaration under Section 564(b)(1) of the Act, 21 U.S.C. section 360bbb-3(b)(1), unless the authorization is terminated or revoked sooner.  Performed at [facility], [place] [address] [place], [place] [address]      Labs: BNP (last 3 results) Recent Labs    09/02/19 1727  BNP 5,726.2*   Basic Metabolic Panel: Recent Labs  Lab 06/18/20 2146 06/19/20 0500 06/19/20 1407 06/20/20 0534 06/21/20 0533  NA 136 138 139 136 138  K 5.6* 5.7* 5.5* 4.7 4.5  CL 107 109 105 109 111  CO2 20* 19* 20* 20* 17*  GLUCOSE 99 105* 114* 99 97  BUN 50* 49* 47* 35* 29*  CREATININE 2.56* 2.42* 2.11* 1.67* 1.46*  CALCIUM 8.7* 9.0 8.9 8.3* 8.3*  MG  --   --   --  1.8 1.6*   Liver Function Tests: Recent Labs  Lab 06/18/20 2146  AST 40  ALT 23  ALKPHOS 131*  BILITOT 0.5  PROT 6.9  ALBUMIN 3.6   No results for input(s): LIPASE, AMYLASE in the last 168 hours. No results for input(s): AMMONIA in the last 168 hours. CBC: Recent Labs  Lab 06/18/20 2146 06/19/20 0500 06/20/20 0534 06/21/20 0533  WBC 10.1 10.4 6.3 7.4  NEUTROABS 7.0  --  4.2 4.7  HGB 8.7* 8.9* 7.7* 7.5*  HCT 30.7* 31.4* 27.0* 26.3*  MCV 80.4 80.9 79.2* 80.4  PLT 323 304 276 243   Cardiac Enzymes: No results for input(s):  CKTOTAL, CKMB, CKMBINDEX, TROPONINI in the last 168 hours. BNP: Invalid input(s): POCBNP CBG: No results for input(s): GLUCAP in the last 168 hours. D-Dimer No results for input(s): DDIMER in the last 72 hours. Hgb A1c No results for input(s): HGBA1C in the last  72 hours. Lipid Profile No results for input(s): CHOL, HDL, LDLCALC, TRIG, CHOLHDL, LDLDIRECT in the last 72 hours. Thyroid function studies No results for input(s): TSH, T4TOTAL, T3FREE, THYROIDAB in the last 72 hours.  Invalid input(s): FREET3 Anemia work up No results for input(s): VITAMINB12, FOLATE, FERRITIN, TIBC, IRON, RETICCTPCT in the last 72 hours. Urinalysis    Component Value Date/Time   COLORURINE AMBER (A) 06/18/2020 2146   APPEARANCEUR CLEAR 06/18/2020 2146   LABSPEC 1.014 06/18/2020 2146   PHURINE 5.0 06/18/2020 2146   GLUCOSEU NEGATIVE 06/18/2020 2146   HGBUR SMALL (A) 06/18/2020 2146   BILIRUBINUR NEGATIVE 06/18/2020 2146   [place] NEGATIVE 06/18/2020 2146   PROTEINUR 100 (A) 06/18/2020 2146   UROBILINOGEN 0.2 07/03/2014 0313   NITRITE POSITIVE (A) 06/18/2020 2146   LEUKOCYTESUR SMALL (A) 06/18/2020 2146   Sepsis Labs Invalid input(s): PROCALCITONIN,  WBC,  LACTICIDVEN Microbiology Recent Results (from the past 240 hour(s))  Culture, Urine     Status: Abnormal   Collection Time: 06/18/20  9:46 PM   Specimen: Urine, Random  Result Value Ref Range Status   Specimen Description   Final    URINE, RANDOM Performed at [facility], [place] [address], [place], [place] [address]    Special Requests   Final    NONE Performed at [facility], [place] [address] [place], [place] [address]    Culture >=100,000 COLONIES/mL KLEBSIELLA OXYTOCA (A)  Final   Report Status 06/21/2020 FINAL  Final   Organism ID, Bacteria KLEBSIELLA OXYTOCA (A)  Final      Susceptibility   Klebsiella oxytoca - MIC*    AMPICILLIN >=32 RESISTANT Resistant     CEFAZOLIN 8 SENSITIVE Sensitive      CEFTRIAXONE <=0.25 SENSITIVE Sensitive     CIPROFLOXACIN <=0.25 SENSITIVE Sensitive     GENTAMICIN <=1 SENSITIVE Sensitive     IMIPENEM <=0.25 SENSITIVE Sensitive     NITROFURANTOIN 32 SENSITIVE Sensitive     TRIMETH/SULFA <=20 SENSITIVE Sensitive     AMPICILLIN/SULBACTAM 16 INTERMEDIATE Intermediate     PIP/TAZO <=4 SENSITIVE Sensitive     * >=100,000 COLONIES/mL KLEBSIELLA OXYTOCA  SARS Coronavirus 2 by RT PCR (hospital order, performed in [place] hospital lab) Nasopharyngeal Nasopharyngeal Swab     Status: None   Collection Time: 06/18/20 11:36 PM   Specimen: Nasopharyngeal Swab  Result Value Ref Range Status   SARS Coronavirus 2 NEGATIVE NEGATIVE Final    Comment: (NOTE) SARS-CoV-2 target nucleic acids are NOT DETECTED.  The SARS-CoV-2 RNA is generally detectable in upper and lower respiratory specimens during the acute phase of infection. The lowest concentration of SARS-CoV-2 viral copies this assay can detect is 250 copies / mL. A negative result does not preclude SARS-CoV-2 infection and should not be used as the sole basis for treatment or other patient management decisions.  A negative result may occur with improper specimen collection / handling, submission of specimen other than nasopharyngeal swab, presence of viral mutation(s) within the areas targeted by this assay, and inadequate number of viral copies (<250 copies / mL). A negative result must be combined with clinical observations, patient history, and epidemiological information.  Fact Sheet for Patients:   StrictlyIdeas.no  Fact Sheet for Healthcare Providers: BankingDealers.co.za  This test is not yet approved or  cleared by the [place] FDA and has been authorized for detection and/or diagnosis of SARS-CoV-2 by FDA under an Emergency Use Authorization (EUA).  This EUA will remain in effect (meaning this  test can be used) for the duration of  the COVID-19 declaration under Section 564(b)(1) of the Act, 21 U.S.C. section 360bbb-3(b)(1), unless the authorization is terminated or revoked sooner.  Performed at [facility], [place] [address], [place], [place] [address]     Procedures/Studies: CT ABDOMEN PELVIS WO CONTRAST  Result Date: 06/18/2020 CLINICAL DATA:  Left lower quadrant abdominal pain. EXAM: CT ABDOMEN AND PELVIS WITHOUT CONTRAST TECHNIQUE: Multidetector CT imaging of the abdomen and pelvis was performed following the standard protocol without IV contrast. COMPARISON:  February 10, 2020 FINDINGS: Lower chest: No acute abnormality. Hepatobiliary: No focal liver abnormality is seen. No gallstones, gallbladder wall thickening, or biliary dilatation. Pancreas: Unremarkable. No pancreatic ductal dilatation or surrounding inflammatory changes. Spleen: Normal in size without focal abnormality. Adrenals/Urinary Tract: Adrenal glands are unremarkable. Kidneys are normal in size, without focal lesions. Stable moderate severity left-sided hydronephrosis and hydroureter is seen. A mild-to-moderate amount of air is seen within the lumen of a partially contracted urinary bladder. This is seen on the prior exam mild diffuse urinary bladder wall thickening is again seen. This is most prominent along the anterolateral aspect of the bladder on the left and is stable in severity when compared to the prior study. Stomach/Bowel: There is a very large gastric hernia. The appendix is not identified. No evidence of bowel dilatation. Numerous diverticula are seen throughout the sigmoid colon. Persistent marked severity thickening of the sigmoid colon is seen. Vascular/Lymphatic: There is marked severity calcification of the abdominal aorta and bilateral common iliac arteries, without evidence of aneurysmal dilatation. No enlarged abdominal or pelvic lymph nodes. Reproductive: The uterus is positioned to the right of midline and is otherwise  normal in appearance. Predominant stable 3.3 cm x 2.0 cm, 1.5 cm x 1.3 cm and 1.5 cm x 1.2 cm cysts are seen along the anterior aspect of the left adnexa. Other: No abdominal wall hernia or abnormality. No abdominopelvic ascites. Musculoskeletal: Multilevel degenerative changes are again seen throughout the lumbar spine. IMPRESSION: 1. Marked severity diverticular disease throughout the sigmoid colon, unchanged in appearance when compared to the prior study. The presence of an underlying neoplastic process cannot be excluded. 2. Air within the urinary bladder lumen with stable asymmetric bladder wall thickening, consistent with the patient's known chronic colovesical fistula and subsequent cystitis. 3. Stable left adnexal cystic lesion. 4. Large gastric hernia. 5. Stable moderate severity left-sided hydronephrosis and hydroureter. Aortic Atherosclerosis (ICD10-I70.0). Electronically Signed   By: Virgina Norfolk M.D.   On: 06/18/2020 21:11     Time coordinating discharge: Over 30 minutes    Dwyane Dee, MD  [facility] 06/21/2020, 12:46 PM Pager: Secure chat  If 7PM-7AM, please contact night-coverage [email or web address] Password [email or web address]

## 2020-06-21 NOTE — Consult Note (Signed)
   [facility] Inpatient Consult   06/21/2020  Pamla Pangle [birth year] [phone]  Patient is currently active with [facility] for chronic disease management services.  Patient has been engaged by a [place] Chief Strategy Officer.  Will continue to follow for progression and disposition and update community care manager of patient admission.  Of note, [facility] Care Management services does not replace or interfere with any services that are arranged by inpatient case management or social work.  Netta Cedars, MSN, [facility] Liaison Nurse Mobile Phone [phone]  Toll free office [phone]

## 2020-06-23 ENCOUNTER — Encounter: Payer: Self-pay | Admitting: Nurse Practitioner

## 2020-06-23 ENCOUNTER — Telehealth (INDEPENDENT_AMBULATORY_CARE_PROVIDER_SITE_OTHER): Payer: Medicare Other | Admitting: Nurse Practitioner

## 2020-06-23 ENCOUNTER — Telehealth: Payer: Self-pay

## 2020-06-23 VITALS — Temp 97.0°F | Ht 63.0 in | Wt 113.0 lb

## 2020-06-23 DIAGNOSIS — N390 Urinary tract infection, site not specified: Secondary | ICD-10-CM

## 2020-06-23 DIAGNOSIS — N321 Vesicointestinal fistula: Secondary | ICD-10-CM

## 2020-06-23 NOTE — Telephone Encounter (Signed)
Transition Care Management Unsuccessful Follow-up Telephone Call  Date of discharge and from where:  06/21/20 [name]  Attempts:  1st Attempt  Reason for unsuccessful TCM follow-up call:  Spoke with patient briefly but mid-way through the conversation patient's son got on the phone & stated that he did not like the questions that were being asked.  He stated that the same questions were asked when they left the hospital & he was tired of the same old questions. He stated that to be 86 years old his mom was doing fairly well & that he was there to help take care of her if she needed anything. I apologized that I had upset him with the questions & insrtucted them to call the office if they needed anything. Patient has a video visit with [name] this afternoon for UTI symptoms.

## 2020-06-23 NOTE — Progress Notes (Signed)
Virtual Visit via Video Note  I connected with@ on 06/23/20 at  2:00 PM EDT by a video enabled telemedicine application and verified that I am speaking with the correct person using two identifiers. Interactive audio and video telecommunications were attempted between this provider and patient, however failed, due to patient having technical difficulties.  We continued and completed visit with audio only.   Location: Patient:Home Provider: Office Participants: son-michael, patient and provider   I discussed the limitations of evaluation and management by telemedicine and the availability of in person appointments. I also discussed with the patient that there may be a patient responsible charge related to this service. The patient expressed understanding and agreed to proceed.  CE:[id number] f/up  History of Present Illness: [name] was discharged 08/23. TCM was attempted but not completed. She was admitted due to urosepsis secondary to a colovesicula fistula. She declined any surgical procedure, so she was treated with IV abx and discharged with oral abx. IV fluid was also administered due to acute kidney injury.  Reviewed of lab results(cbc,BMP, mag, urinalysis and urine culture)  and radiology report (CT ABD/pelvis) Today she reports, her symptoms have not changed (ABD pain and dysuria). She denies any fever. She has not start oral abx sent. [name] states he will get oral abx tomorrow. [name] declines to schedule an appt with urology or cardiology.   Observations/Objective: Alert and oriented x4 No video for physical exam  Assessment and Plan: Roselyn was seen today for follow-up.  Diagnoses and all orders for this visit:  Complicated UTI (urinary tract infection)  Colovesical fistula   Follow Up Instructions: Start oral abx as prescribed by hospital provider Schedule appt with [facility] Advised [name] and [name] to return to the hospital if symptoms worsen.    I discussed the assessment and treatment plan with the patient. The patient was provided an opportunity to ask questions and all were answered. The patient agreed with the plan and demonstrated an understanding of the instructions.   The patient was advised to call back or seek an in-person evaluation if the symptoms worsen or if the condition fails to improve as anticipated.  I spent 64mins on telephone with Ms. Cassady and Ralene Ok, NP

## 2020-06-23 NOTE — Telephone Encounter (Signed)
Ok, thank you

## 2020-06-24 ENCOUNTER — Other Ambulatory Visit: Payer: Self-pay | Admitting: *Deleted

## 2020-06-24 ENCOUNTER — Telehealth: Payer: Self-pay | Admitting: Nurse Practitioner

## 2020-06-24 NOTE — Telephone Encounter (Signed)
Patient son is calling back and requesting a call back, please advise. CB is [phone].

## 2020-06-24 NOTE — Telephone Encounter (Signed)
Patient's son called, stated patient talked to [place] yesterday about someone coming to the house for home health care. [facility] called them and he is not happy with their service. He would like someone to call him to review this home healthcare information. He is requesting a call back asap.

## 2020-06-24 NOTE — Telephone Encounter (Signed)
Spoke with patient son and he states Pallative care did reach out to him and they are now just trying to work on a plan of care. He states he would like his contact number to be the first number we call in regards to his mom because calling her number just gets her all confused. His number is [phone] [name])

## 2020-06-24 NOTE — Patient Outreach (Signed)
[facility]) Care Management  06/24/2020  [name] 1926-11-30 [id number]   Telephone Assessment-Successful  RN spoke with pt today concerning her recent discharge. Pt reports ongoing issues post op but some improvement. Pt report home O2 ranging from 2-4 liters. States she does not weigh daily but has permission to take extra fluid medication if she has weight gain over 3 lbs. Pt indicates some swelling to her LE as RN requested her to weigh if possible for a baseline weight. Pt weighed at 115 lbs from her normal readings of 113 lbs. Pt requested RN to speak with her son [name] who verified the above information and states he just pick up all her medications and pt just took her fluid pill. RN attempted to gather additional information concerning pt's ongoing management of care. Caregiver son indicates pt will have weekly visits by an agency and states he cancelled [facility] services earlier and does not wish to continue ongoing [facility] services at this time.   Son grateful and appreciative but feel pt does not feel pt needs [facility] services. No further services initiated as RN will close this case and notify the provider of the change in services. RN informed the caregiver if services are needed in the future please reach out the [facility] services for additional services. Again caregiver appreciative and grateful. Call ended and case will be closed.  Raina Mina, RN Care Management Coordinator [place] Office [phone]

## 2020-06-25 ENCOUNTER — Telehealth: Payer: Self-pay

## 2020-06-25 NOTE — Telephone Encounter (Signed)
Telephone call to patient to schedule palliative care visit with patient. A message was left for patient.

## 2020-07-06 ENCOUNTER — Telehealth: Payer: Self-pay

## 2020-07-06 NOTE — Telephone Encounter (Signed)
(  2:20p) SW attempted to call patient several times and did not receive and answer, but left a message. SW then telephoned patient's son-[name]. SW talked with Jacqulynn Cadet to advise him that she was trying to reach patient for the past week and half. Jacqulynn Cadet explained that patient was hard of hearing and will not answer her phone. He stated that his older brother lives with her as well and may not answer the phone.Jacqulynn Cadet asked for more information about palliative care. SW provided education about the services, visit frequency and role in her overall care. Jacqulynn Cadet explained that they have tried to have nurses from other agencies visit with patient before and she eventually sent them away, stating she didn't need them. Jacqulynn Cadet advised that patient has good days and bad days, but overall she was doing okay. He advised that patient goes out to get her hair and nails done and his brother prepares her meals. SW inquired how if patient needed assistance with personal care needs. Jacqulynn Cadet advised that patient is independent for personal care needs. SW asked Jacqulynn Cadet to please talk with his mother about the program as we would love to visit and provide support to her and have her or he could give [name] a call back. Jacqulynn Cadet stated that SW could text patient and she may respond back. SW again, strongly encouraged Jacqulynn Cadet to discuss palliative care services with patient and follow-up with SW as soon as possible.

## 2020-07-07 NOTE — Telephone Encounter (Signed)
I called [phone] and spoke to pt and her son [name]. This is the best ph# to contact. They noted they hadn't been in touch but left msgs for [facility]. I called [facility] and spoke with Stacy. She said that today she forwarded the updated ph # to [facility]. I called back and advised pt and [name] to expect a call back w/in 24 hours.

## 2020-07-09 NOTE — Telephone Encounter (Signed)
Patient is calling back and stated that she still hasn't heard from palliative care yet, please advise. CB is [phone]

## 2020-07-09 NOTE — Telephone Encounter (Signed)
Please provide status update. Have you been able to contact the pt or son [name]? Thank you.

## 2020-07-12 ENCOUNTER — Telehealth: Payer: Self-pay | Admitting: Nurse Practitioner

## 2020-07-12 NOTE — Telephone Encounter (Signed)
Patient is calling to speak to staff regarding her cords for her oxygen tank. She did not know the name of the company, but states she still has not received the replacement cords. She also would like a carrying case ordered. Please call her back on [phone] and advise.

## 2020-07-12 NOTE — Telephone Encounter (Signed)
Pt son states he has been going back and forth with palliative care and could never get anything accomplished. Pt needs a portal oxygen tank and new nasal canals, I informed pt son that he would need to reach out to [employer] for the oxygen issues and to continue to call palliative care because it appears they have been trying to contact him. Pt son verbalized understanding.

## 2020-07-13 ENCOUNTER — Telehealth: Payer: Self-pay | Admitting: *Deleted

## 2020-07-13 NOTE — Telephone Encounter (Signed)
Called and spoke with patient to schedule a Palliative care home visit. Visit scheduled for Friday 9/17@2p .

## 2020-07-15 ENCOUNTER — Telehealth: Payer: Self-pay | Admitting: Nurse Practitioner

## 2020-07-15 DIAGNOSIS — R102 Pelvic and perineal pain: Secondary | ICD-10-CM | POA: Diagnosis not present

## 2020-07-15 DIAGNOSIS — N321 Vesicointestinal fistula: Secondary | ICD-10-CM | POA: Diagnosis not present

## 2020-07-15 DIAGNOSIS — N390 Urinary tract infection, site not specified: Secondary | ICD-10-CM

## 2020-07-15 DIAGNOSIS — N1330 Unspecified hydronephrosis: Secondary | ICD-10-CM | POA: Diagnosis not present

## 2020-07-15 DIAGNOSIS — N3020 Other chronic cystitis without hematuria: Secondary | ICD-10-CM | POA: Diagnosis not present

## 2020-07-15 MED ORDER — FOSFOMYCIN TROMETHAMINE 3 G PO PACK: 3.0000 g | PACK | Freq: Once | ORAL | 0 refills | Status: AC

## 2020-07-15 MED ORDER — PHENAZOPYRIDINE HCL 100 MG PO TABS: 100.0000 mg | ORAL_TABLET | Freq: Three times a day (TID) | ORAL | 0 refills | Status: DC

## 2020-07-15 NOTE — Telephone Encounter (Signed)
Fosfomycin and pyridium sent

## 2020-07-15 NOTE — Telephone Encounter (Signed)
Patients son [name] is calling to see if patient can get something called in for pain. He states that she has a really bad bladder infection, but is not able to come in for an appointment. Please call him back at [phone] and advise.

## 2020-07-15 NOTE — Telephone Encounter (Signed)
Please advise 

## 2020-07-15 NOTE — Telephone Encounter (Signed)
Pt son notified and verbalized understanding.  ?

## 2020-07-16 ENCOUNTER — Other Ambulatory Visit: Payer: Medicare Other | Admitting: *Deleted

## 2020-07-16 ENCOUNTER — Other Ambulatory Visit: Payer: Medicare Other

## 2020-07-16 ENCOUNTER — Other Ambulatory Visit: Payer: Self-pay

## 2020-07-16 DIAGNOSIS — Z515 Encounter for palliative care: Secondary | ICD-10-CM

## 2020-07-19 NOTE — Progress Notes (Signed)
COMMUNITY PALLIATIVE CARE SW NOTE  PATIENT NAME: [name] DOB: 03-21-1927 MRN: [id number]  PRIMARY CARE PROVIDER: [name], NP  RESPONSIBLE PARTY:  Acct ID - Guarantor Home Phone Work Phone Relationship Acct Type  [id number] - Schnebly,[name] [id number]  Self P/F     [place], [place] [address]     PLAN OF CARE and INTERVENTIONS:             1. GOALS OF CARE/ ADVANCE CARE PLANNING:  Goal is for patient to remain in her home. Patient is a DNR, form in the home.  2. SOCIAL/EMOTIONAL/SPIRITUAL ASSESSMENT/ INTERVENTIONS:  SW and RN completed an initial visit with patient at her home. She was present with her sonLegrand Mcdonald. The team provided education around palliative care services, visit frequency and the team role in care. Patient provided verbal consent to services. Literature and contact information was provided to patient and her son for their review/education. SW provided medical status and social history on herself. Patient report that she has restless legs that causes discomfort. She has a history of swelling and arthritis. She has shortness of breath with activity. Patient started ABT yesterday for infection. Patient is on 2L of 02 at night. Patient is independent for personal care needs. Her son [name] lives with her and serves as her PCG/POA. Patient is from [place]. She is widowed. Her husband was a retired Scientist, research (life sciences). Patient worked in Scientist, research (medical). She has two son-[name] and [name]. Patient has a living will, HCPOA/POA and DNR in the home. SW completed education about palliative care services, supportive presence, active listening, assessment of needs and comfort of patient and coping of PCG.   3. PATIENT/CAREGIVER EDUCATION/ COPING:  Patient appears to be coping well. She is in good spirits. She is alert and oriented to x3, 4. PERSONAL EMERGENCY PLAN:  911 can be activated for emergencies.  5. COMMUNITY RESOURCES COORDINATION/ HEALTH CARE  NAVIGATION:  Patient is able to go out and run errands with her son. She go out 1-2x a week.  6. FINANCIAL/LEGAL CONCERNS/INTERVENTIONS:  None     SOCIAL HX:  Social History   Tobacco Use  . Smoking status: Former Smoker    Types: Cigarettes    Quit date: 10/31/1983    Years since quitting: 36.7  . Smokeless tobacco: Never Used  Substance Use Topics  . Alcohol use: No    CODE STATUS: DNR, form in th home ADVANCED DIRECTIVES: No MOST FORM COMPLETE:  No HOSPICE EDUCATION PROVIDED: No  PPS: Patient is alert and oriented to self x3. She ambulate with a walker outside of her home. Patient is independent for personal care.       [address] [facility], [place]

## 2020-07-22 ENCOUNTER — Telehealth: Payer: Self-pay | Admitting: Nurse Practitioner

## 2020-07-22 DIAGNOSIS — R0902 Hypoxemia: Secondary | ICD-10-CM

## 2020-07-22 DIAGNOSIS — I5042 Chronic combined systolic (congestive) and diastolic (congestive) heart failure: Secondary | ICD-10-CM

## 2020-07-22 NOTE — Telephone Encounter (Signed)
Patient is calling again for a portable oxygen tank. She states she has called several times before and still has not received one.

## 2020-07-23 NOTE — Telephone Encounter (Signed)
Patient's son states he called oxygen company to order the portably oxygen tank and they told him that [name] has to send an RX to [employer], phones [phone].

## 2020-07-23 NOTE — Telephone Encounter (Signed)
Patient called back and insists that our office has to order this for her. She states if she calls the company, they will not give her anything and [name] has to order it for her.

## 2020-07-23 NOTE — Telephone Encounter (Signed)
This has been discussed with patient and she has been informed to reach out to her the company that supplies her with her oxygen tank to see how they can help with getting her a portable one.

## 2020-07-26 NOTE — Telephone Encounter (Signed)
Order entered. Please fax to [facility]

## 2020-07-26 NOTE — Telephone Encounter (Signed)
Patient is calling back to check the status of portably oxygen tank, please advise. CB is [phone].

## 2020-07-26 NOTE — Telephone Encounter (Signed)
Spoke with son and confirmed we have put in order to [employer] and they informed our office they were back ordered and it would take time before patient received POC.

## 2020-07-26 NOTE — Addendum Note (Signed)
Addended by: Leana Gamer on: 07/26/2020 03:41 PM   Modules accepted: Orders

## 2020-07-30 NOTE — Progress Notes (Signed)
COMMUNITY PALLIATIVE CARE RN NOTE  PATIENT NAME: [name] DOB: Aug 30, 1927 MRN: [id number]  PRIMARY CARE PROVIDER: [name], NP  RESPONSIBLE PARTY: [place]ia [place] (son) Acct ID - Guarantor Home Phone Work Phone Relationship Acct Type  [id number] - Reasoner,[name] [phone]  Self P/F     [place], [place], [place] [address]   Covid-19 Pre-screening Negative  PLAN OF CARE and INTERVENTION:  1. ADVANCE CARE PLANNING/GOALS OF CARE: Goal is to not go into a nursing home. She wants to remain in her home.  2. PATIENT/CAREGIVER EDUCATION: Explained Palliative care services 3. DISEASE STATUS: Joint visit made with Palliative care SW, M. Lonon. Met with patient and her son, [name] in their home. [name] says he moved into his mother's condo about 2 years ago as her health started to deteriorate. She reports arthritic pain in her hands and legs. She says Tylenol is effective in managing pain. She also has restless legs and at times has to take a Xanax to help. She spoke about her history of panic attacks. She is ambulatory and able to perform ADLs independently. She does become short of breath with exertion and requires frequent rest periods. She wears Oxygen at 2L/min via [place] during the night. She talks about her biggest issue d/t a hole in her bladder after a surgery she had. She says her bladder will fill up with feces and she requires a hospitalization every 3 months to get it cleaned out. She started on an antibiotic (Cefpodoxime twice daily) yesterday for infection. She reports painful urination. She was also prescribed Pyridium 3x/day to help and she has a cream insert to help with irritation. She continues to travel outside of her home to the beauty and nail shop. She enjoys going out and reading books. She says that the words have to be in a larger print in order for her to read them. She is agreeable to future visits from Palliative care. Will continue to monitor.     HISTORY OF PRESENT ILLNESS: This is a 86 yo female with a h/o Afib, CHF, aortic stenosis, HTN, hyperlipidemia and anxiety. Palliative care team has been asked to follow patient for additional support. Will visit patient monthly and PRN.   CODE STATUS: DNR ADVANCED DIRECTIVES: Y (Living Will, POA) MOST FORM: no PPS: 50%   (Duration of visit and documentation 75 minutes)   Daryl Eastern, RN BSN

## 2020-08-04 ENCOUNTER — Encounter (HOSPITAL_COMMUNITY): Payer: Self-pay

## 2020-08-04 ENCOUNTER — Inpatient Hospital Stay (HOSPITAL_COMMUNITY)
Admission: EM | Admit: 2020-08-04 | Discharge: 2020-08-07 | DRG: 690 | Disposition: A | Discharge status: Home Health | Payer: Medicare Other | Attending: Internal Medicine | Admitting: Internal Medicine

## 2020-08-04 ENCOUNTER — Other Ambulatory Visit: Payer: Self-pay

## 2020-08-04 ENCOUNTER — Emergency Department (HOSPITAL_COMMUNITY): Payer: Medicare Other

## 2020-08-04 DIAGNOSIS — R1084 Generalized abdominal pain: Secondary | ICD-10-CM | POA: Diagnosis not present

## 2020-08-04 DIAGNOSIS — Z7982 Long term (current) use of aspirin: Secondary | ICD-10-CM | POA: Diagnosis not present

## 2020-08-04 DIAGNOSIS — Z79890 Hormone replacement therapy (postmenopausal): Secondary | ICD-10-CM | POA: Diagnosis not present

## 2020-08-04 DIAGNOSIS — Z91041 Radiographic dye allergy status: Secondary | ICD-10-CM | POA: Diagnosis not present

## 2020-08-04 DIAGNOSIS — W19XXXA Unspecified fall, initial encounter: Secondary | ICD-10-CM

## 2020-08-04 DIAGNOSIS — K449 Diaphragmatic hernia without obstruction or gangrene: Secondary | ICD-10-CM | POA: Diagnosis not present

## 2020-08-04 DIAGNOSIS — I110 Hypertensive heart disease with heart failure: Secondary | ICD-10-CM | POA: Diagnosis present

## 2020-08-04 DIAGNOSIS — Z9981 Dependence on supplemental oxygen: Secondary | ICD-10-CM

## 2020-08-04 DIAGNOSIS — Z888 Allergy status to other drugs, medicaments and biological substances status: Secondary | ICD-10-CM

## 2020-08-04 DIAGNOSIS — M47816 Spondylosis without myelopathy or radiculopathy, lumbar region: Secondary | ICD-10-CM | POA: Diagnosis present

## 2020-08-04 DIAGNOSIS — Z1611 Resistance to penicillins: Secondary | ICD-10-CM | POA: Diagnosis present

## 2020-08-04 DIAGNOSIS — N1330 Unspecified hydronephrosis: Secondary | ICD-10-CM | POA: Diagnosis present

## 2020-08-04 DIAGNOSIS — Z885 Allergy status to narcotic agent status: Secondary | ICD-10-CM | POA: Diagnosis not present

## 2020-08-04 DIAGNOSIS — N390 Urinary tract infection, site not specified: Secondary | ICD-10-CM | POA: Diagnosis not present

## 2020-08-04 DIAGNOSIS — I4819 Other persistent atrial fibrillation: Secondary | ICD-10-CM | POA: Diagnosis present

## 2020-08-04 DIAGNOSIS — I10 Essential (primary) hypertension: Secondary | ICD-10-CM | POA: Diagnosis present

## 2020-08-04 DIAGNOSIS — E611 Iron deficiency: Secondary | ICD-10-CM | POA: Diagnosis present

## 2020-08-04 DIAGNOSIS — E039 Hypothyroidism, unspecified: Secondary | ICD-10-CM | POA: Diagnosis present

## 2020-08-04 DIAGNOSIS — Z825 Family history of asthma and other chronic lower respiratory diseases: Secondary | ICD-10-CM

## 2020-08-04 DIAGNOSIS — N136 Pyonephrosis: Secondary | ICD-10-CM | POA: Diagnosis not present

## 2020-08-04 DIAGNOSIS — B961 Klebsiella pneumoniae [K. pneumoniae] as the cause of diseases classified elsewhere: Secondary | ICD-10-CM | POA: Diagnosis present

## 2020-08-04 DIAGNOSIS — I350 Nonrheumatic aortic (valve) stenosis: Secondary | ICD-10-CM | POA: Diagnosis present

## 2020-08-04 DIAGNOSIS — I700 Atherosclerosis of aorta: Secondary | ICD-10-CM | POA: Diagnosis not present

## 2020-08-04 DIAGNOSIS — Z880 Allergy status to penicillin: Secondary | ICD-10-CM

## 2020-08-04 DIAGNOSIS — N179 Acute kidney failure, unspecified: Secondary | ICD-10-CM | POA: Diagnosis present

## 2020-08-04 DIAGNOSIS — K5792 Diverticulitis of intestine, part unspecified, without perforation or abscess without bleeding: Secondary | ICD-10-CM | POA: Diagnosis present

## 2020-08-04 DIAGNOSIS — Z886 Allergy status to analgesic agent status: Secondary | ICD-10-CM

## 2020-08-04 DIAGNOSIS — Z8249 Family history of ischemic heart disease and other diseases of the circulatory system: Secondary | ICD-10-CM

## 2020-08-04 DIAGNOSIS — M158 Other polyosteoarthritis: Secondary | ICD-10-CM | POA: Diagnosis not present

## 2020-08-04 DIAGNOSIS — I5042 Chronic combined systolic (congestive) and diastolic (congestive) heart failure: Secondary | ICD-10-CM | POA: Diagnosis present

## 2020-08-04 DIAGNOSIS — Z79899 Other long term (current) drug therapy: Secondary | ICD-10-CM | POA: Diagnosis not present

## 2020-08-04 DIAGNOSIS — N321 Vesicointestinal fistula: Secondary | ICD-10-CM | POA: Diagnosis present

## 2020-08-04 DIAGNOSIS — Z9842 Cataract extraction status, left eye: Secondary | ICD-10-CM

## 2020-08-04 DIAGNOSIS — G2581 Restless legs syndrome: Secondary | ICD-10-CM | POA: Diagnosis present

## 2020-08-04 DIAGNOSIS — E785 Hyperlipidemia, unspecified: Secondary | ICD-10-CM | POA: Diagnosis present

## 2020-08-04 DIAGNOSIS — Z87891 Personal history of nicotine dependence: Secondary | ICD-10-CM

## 2020-08-04 DIAGNOSIS — R109 Unspecified abdominal pain: Secondary | ICD-10-CM | POA: Diagnosis not present

## 2020-08-04 DIAGNOSIS — Z9841 Cataract extraction status, right eye: Secondary | ICD-10-CM

## 2020-08-04 DIAGNOSIS — M159 Polyosteoarthritis, unspecified: Secondary | ICD-10-CM | POA: Diagnosis present

## 2020-08-04 DIAGNOSIS — Z20822 Contact with and (suspected) exposure to covid-19: Secondary | ICD-10-CM | POA: Diagnosis present

## 2020-08-04 DIAGNOSIS — Z87440 Personal history of urinary (tract) infections: Secondary | ICD-10-CM

## 2020-08-04 DIAGNOSIS — D494 Neoplasm of unspecified behavior of bladder: Secondary | ICD-10-CM | POA: Diagnosis not present

## 2020-08-04 DIAGNOSIS — F419 Anxiety disorder, unspecified: Secondary | ICD-10-CM | POA: Diagnosis present

## 2020-08-04 DIAGNOSIS — M069 Rheumatoid arthritis, unspecified: Secondary | ICD-10-CM | POA: Diagnosis present

## 2020-08-04 DIAGNOSIS — N399 Disorder of urinary system, unspecified: Secondary | ICD-10-CM | POA: Diagnosis not present

## 2020-08-04 LAB — CBC WITH DIFFERENTIAL/PLATELET
Abs Immature Granulocytes: 0.04 10*3/uL (ref 0.00–0.07)
Basophils Absolute: 0.1 10*3/uL (ref 0.0–0.1)
Basophils Relative: 1 %
Eosinophils Absolute: 0.2 10*3/uL (ref 0.0–0.5)
Eosinophils Relative: 2 %
HCT: 29.7 % — ABNORMAL LOW (ref 36.0–46.0)
Hemoglobin: 8.8 g/dL — ABNORMAL LOW (ref 12.0–15.0)
Immature Granulocytes: 0 %
Lymphocytes Relative: 16 %
Lymphs Abs: 1.7 10*3/uL (ref 0.7–4.0)
MCH: 24.2 pg — ABNORMAL LOW (ref 26.0–34.0)
MCHC: 29.6 g/dL — ABNORMAL LOW (ref 30.0–36.0)
MCV: 81.8 fL (ref 80.0–100.0)
Monocytes Absolute: 0.9 10*3/uL (ref 0.1–1.0)
Monocytes Relative: 8 %
Neutro Abs: 7.8 10*3/uL — ABNORMAL HIGH (ref 1.7–7.7)
Neutrophils Relative %: 73 %
Platelets: 328 10*3/uL (ref 150–400)
RBC: 3.63 MIL/uL — ABNORMAL LOW (ref 3.87–5.11)
RDW: 20.4 % — ABNORMAL HIGH (ref 11.5–15.5)
WBC: 10.6 10*3/uL — ABNORMAL HIGH (ref 4.0–10.5)
nRBC: 0 % (ref 0.0–0.2)

## 2020-08-04 LAB — BASIC METABOLIC PANEL
Anion gap: 12 (ref 5–15)
BUN: 29 mg/dL — ABNORMAL HIGH (ref 8–23)
CO2: 22 mmol/L (ref 22–32)
Calcium: 9.4 mg/dL (ref 8.9–10.3)
Chloride: 104 mmol/L (ref 98–111)
Creatinine, Ser: 1.22 mg/dL — ABNORMAL HIGH (ref 0.44–1.00)
GFR calc non Af Amer: 39 mL/min — ABNORMAL LOW (ref >60)
Glucose, Bld: 113 mg/dL — ABNORMAL HIGH (ref 70–99)
Potassium: 4.3 mmol/L (ref 3.5–5.1)
Sodium: 138 mmol/L (ref 135–145)

## 2020-08-04 LAB — CBC
HCT: 27.1 % — ABNORMAL LOW (ref 36.0–46.0)
Hemoglobin: 8.2 g/dL — ABNORMAL LOW (ref 12.0–15.0)
MCH: 24.5 pg — ABNORMAL LOW (ref 26.0–34.0)
MCHC: 30.3 g/dL (ref 30.0–36.0)
MCV: 80.9 fL (ref 80.0–100.0)
Platelets: 316 10*3/uL (ref 150–400)
RBC: 3.35 MIL/uL — ABNORMAL LOW (ref 3.87–5.11)
RDW: 20.5 % — ABNORMAL HIGH (ref 11.5–15.5)
WBC: 9.4 10*3/uL (ref 4.0–10.5)
nRBC: 0 % (ref 0.0–0.2)

## 2020-08-04 LAB — IRON AND TIBC
Iron: 44 ug/dL (ref 28–170)
Saturation Ratios: 12 % (ref 10.4–31.8)
TIBC: 376 ug/dL (ref 250–450)
UIBC: 332 ug/dL

## 2020-08-04 LAB — LACTIC ACID, PLASMA: Lactic Acid, Venous: 0.8 mmol/L (ref 0.5–1.9)

## 2020-08-04 LAB — URINALYSIS, ROUTINE W REFLEX MICROSCOPIC
Bilirubin Urine: NEGATIVE
Glucose, UA: NEGATIVE mg/dL
Ketones, ur: NEGATIVE mg/dL
Nitrite: NEGATIVE
Protein, ur: 100 mg/dL — AB
RBC / HPF: >50 RBC/hpf — ABNORMAL HIGH (ref 0–5)
Specific Gravity, Urine: 1.019 (ref 1.005–1.030)
WBC, UA: >50 WBC/hpf — ABNORMAL HIGH (ref 0–5)
pH: 5 (ref 5.0–8.0)

## 2020-08-04 LAB — HEMOGLOBIN A1C
Hgb A1c MFr Bld: 5.7 % — ABNORMAL HIGH (ref 4.8–5.6)
Mean Plasma Glucose: 116.89 mg/dL

## 2020-08-04 LAB — RESPIRATORY PANEL BY RT PCR (FLU A&B, COVID)
Influenza A by PCR: NEGATIVE
Influenza B by PCR: NEGATIVE
SARS Coronavirus 2 by RT PCR: NEGATIVE

## 2020-08-04 LAB — TSH: TSH: 0.787 u[IU]/mL (ref 0.350–4.500)

## 2020-08-04 LAB — MAGNESIUM: Magnesium: 1.7 mg/dL (ref 1.7–2.4)

## 2020-08-04 LAB — BRAIN NATRIURETIC PEPTIDE: B Natriuretic Peptide: 530.4 pg/mL — ABNORMAL HIGH (ref 0.0–100.0)

## 2020-08-04 LAB — PHOSPHORUS: Phosphorus: 3.7 mg/dL (ref 2.5–4.6)

## 2020-08-04 MED ORDER — IOHEXOL 300 MG/ML  SOLN: 75.0000 mL | Freq: Once | INTRAMUSCULAR | Status: DC | PRN

## 2020-08-04 MED ORDER — METOPROLOL SUCCINATE ER 50 MG PO TB24
75.0000 mg | ORAL_TABLET | Freq: Every day | ORAL | Status: DC
  Administered 2020-08-04 – 2020-08-07 (×4): 75 mg via ORAL
  Filled 2020-08-04: qty 1
  Filled 2020-08-04: qty 2
  Filled 2020-08-04 (×2): qty 1

## 2020-08-04 MED ORDER — HYDROCODONE-ACETAMINOPHEN 5-325 MG PO TABS
1.0000 | ORAL_TABLET | Freq: Four times a day (QID) | ORAL | Status: DC | PRN
  Filled 2020-08-04: qty 1

## 2020-08-04 MED ORDER — FENTANYL CITRATE (PF) 100 MCG/2ML IJ SOLN
50.0000 ug | Freq: Once | INTRAMUSCULAR | Status: AC
  Administered 2020-08-04: 50 ug via INTRAVENOUS
  Filled 2020-08-04: qty 2

## 2020-08-04 MED ORDER — GABAPENTIN 100 MG PO CAPS
100.0000 mg | ORAL_CAPSULE | Freq: Three times a day (TID) | ORAL | Status: DC
  Administered 2020-08-04 – 2020-08-06 (×4): 100 mg via ORAL
  Filled 2020-08-04 (×6): qty 1

## 2020-08-04 MED ORDER — SODIUM CHLORIDE 0.9 % IV SOLN: INTRAVENOUS | Status: AC

## 2020-08-04 MED ORDER — SODIUM CHLORIDE (PF) 0.9 % IJ SOLN
INTRAMUSCULAR | Status: AC
  Filled 2020-08-04: qty 50

## 2020-08-04 MED ORDER — OLOPATADINE HCL 0.1 % OP SOLN
1.0000 [drp] | Freq: Two times a day (BID) | OPHTHALMIC | Status: DC
  Administered 2020-08-04 – 2020-08-07 (×6): 1 [drp] via OPHTHALMIC
  Filled 2020-08-04: qty 5

## 2020-08-04 MED ORDER — SODIUM CHLORIDE 0.9 % IV BOLUS
500.0000 mL | Freq: Once | INTRAVENOUS | Status: AC
  Administered 2020-08-04: 500 mL via INTRAVENOUS

## 2020-08-04 MED ORDER — ONDANSETRON HCL 4 MG PO TABS: 4.0000 mg | ORAL_TABLET | Freq: Four times a day (QID) | ORAL | Status: DC | PRN

## 2020-08-04 MED ORDER — ONDANSETRON HCL 4 MG/2ML IJ SOLN: 4.0000 mg | Freq: Four times a day (QID) | INTRAMUSCULAR | Status: DC | PRN

## 2020-08-04 MED ORDER — PHENAZOPYRIDINE HCL 100 MG PO TABS: 100.0000 mg | ORAL_TABLET | Freq: Three times a day (TID) | ORAL | Status: DC

## 2020-08-04 MED ORDER — ALPRAZOLAM 0.25 MG PO TABS
0.2500 mg | ORAL_TABLET | Freq: Every evening | ORAL | Status: DC | PRN
  Administered 2020-08-04 – 2020-08-06 (×3): 0.25 mg via ORAL
  Filled 2020-08-04 (×3): qty 1

## 2020-08-04 MED ORDER — LEVALBUTEROL HCL 0.63 MG/3ML IN NEBU: 0.6300 mg | INHALATION_SOLUTION | Freq: Four times a day (QID) | RESPIRATORY_TRACT | Status: DC | PRN

## 2020-08-04 MED ORDER — SODIUM CHLORIDE 0.9 % IV SOLN
500.0000 mg | Freq: Once | INTRAVENOUS | Status: AC
  Administered 2020-08-04: 500 mg via INTRAVENOUS
  Filled 2020-08-04: qty 500

## 2020-08-04 MED ORDER — HEPARIN SODIUM (PORCINE) 5000 UNIT/ML IJ SOLN
5000.0000 [IU] | Freq: Three times a day (TID) | INTRAMUSCULAR | Status: DC
  Administered 2020-08-04: 5000 [IU] via SUBCUTANEOUS
  Filled 2020-08-04 (×2): qty 1

## 2020-08-04 MED ORDER — DOCUSATE SODIUM 100 MG PO CAPS
100.0000 mg | ORAL_CAPSULE | Freq: Two times a day (BID) | ORAL | Status: DC
  Filled 2020-08-04 (×5): qty 1

## 2020-08-04 MED ORDER — ADULT MULTIVITAMIN W/MINERALS CH
1.0000 | ORAL_TABLET | Freq: Every day | ORAL | Status: DC
  Administered 2020-08-04 – 2020-08-07 (×4): 1 via ORAL
  Filled 2020-08-04 (×4): qty 1

## 2020-08-04 MED ORDER — FUROSEMIDE 20 MG PO TABS
20.0000 mg | ORAL_TABLET | Freq: Every day | ORAL | Status: DC
  Filled 2020-08-04 (×3): qty 1

## 2020-08-04 MED ORDER — SORBITOL 70 % SOLN
30.0000 mL | Freq: Every day | Status: DC | PRN
  Filled 2020-08-04: qty 30

## 2020-08-04 MED ORDER — SENNOSIDES-DOCUSATE SODIUM 8.6-50 MG PO TABS: 1.0000 | ORAL_TABLET | Freq: Every evening | ORAL | Status: DC | PRN

## 2020-08-04 MED ORDER — PENTOSAN POLYSULFATE SODIUM 100 MG PO CAPS
100.0000 mg | ORAL_CAPSULE | Freq: Three times a day (TID) | ORAL | Status: DC
  Administered 2020-08-04 – 2020-08-06 (×6): 100 mg via ORAL
  Filled 2020-08-04 (×10): qty 1

## 2020-08-04 MED ORDER — ALBUTEROL SULFATE (2.5 MG/3ML) 0.083% IN NEBU: 2.5000 mg | INHALATION_SOLUTION | RESPIRATORY_TRACT | Status: DC | PRN

## 2020-08-04 MED ORDER — ACETAMINOPHEN 325 MG PO TABS
650.0000 mg | ORAL_TABLET | Freq: Four times a day (QID) | ORAL | Status: DC | PRN
  Administered 2020-08-04 – 2020-08-05 (×2): 650 mg via ORAL
  Filled 2020-08-04 (×2): qty 2

## 2020-08-04 MED ORDER — ASPIRIN 81 MG PO CHEW
81.0000 mg | CHEWABLE_TABLET | Freq: Every day | ORAL | Status: DC
  Administered 2020-08-04 – 2020-08-07 (×4): 81 mg via ORAL
  Filled 2020-08-04 (×4): qty 1

## 2020-08-04 MED ORDER — SODIUM CHLORIDE 0.9% FLUSH
3.0000 mL | Freq: Two times a day (BID) | INTRAVENOUS | Status: DC
  Administered 2020-08-05 – 2020-08-07 (×4): 3 mL via INTRAVENOUS

## 2020-08-04 MED ORDER — SODIUM CHLORIDE 0.9 % IV SOLN
500.0000 mg | Freq: Two times a day (BID) | INTRAVENOUS | Status: DC
  Administered 2020-08-05 – 2020-08-06 (×3): 500 mg via INTRAVENOUS
  Filled 2020-08-04 (×3): qty 500
  Filled 2020-08-04: qty 0.5

## 2020-08-04 MED ORDER — LEVOTHYROXINE SODIUM 50 MCG PO TABS
75.0000 ug | ORAL_TABLET | Freq: Every day | ORAL | Status: DC
  Administered 2020-08-05 – 2020-08-07 (×3): 75 ug via ORAL
  Filled 2020-08-04 (×3): qty 1

## 2020-08-04 MED ORDER — TRAZODONE HCL 50 MG PO TABS: 50.0000 mg | ORAL_TABLET | Freq: Every evening | ORAL | Status: DC | PRN

## 2020-08-04 MED ORDER — ACETAMINOPHEN 650 MG RE SUPP: 650.0000 mg | Freq: Four times a day (QID) | RECTAL | Status: DC | PRN

## 2020-08-04 NOTE — ED Notes (Signed)
Per Nunzio Cory in lab- 1 more lavendar lab tube needs to be submitted for lab testing ordered.  Primary RN Crystal made aware.

## 2020-08-04 NOTE — Progress Notes (Signed)
ANTICOAGULATION CONSULT NOTE - Initial Consult  Pharmacy Consult for Heparin Indication: VTE prophylaxis  Allergies  Allergen Reactions  . Infliximab Other (See Comments)    REACTION: "Enlarged intestines and hernia. Also pushed intestines on lower part of lungs." REACTION: "Enlarged intestines and hernia. Also pushed intestines on lower part of lungs."  . Aspirin Nausea And Vomiting    Low Dose is ok Stomach problems  . Carvedilol Other (See Comments)    Fatigue/ ill Fatigue/ ill  . Codeine Nausea And Vomiting    "Deathly Sick"  . Contrast Media [Iodinated Diagnostic Agents] Other (See Comments)    All over body pain  . Penicillins Rash    Has patient had a PCN reaction causing immediate rash, facial/tongue/throat swelling, SOB or lightheadedness with hypotension: Y Has patient had a PCN reaction causing severe rash involving mucus membranes or skin necrosis: Y Has patient had a PCN reaction that required hospitalization: N Has patient had a PCN reaction occurring within the last 10 years: N If all of the above answers are "NO", then may proceed with Cephalosporin use.     Patient Measurements: Weight: 51.3 kg (113 lb)  Vital Signs: Temp: 98.3 F (36.8 C) (10/06 1110) Temp Source: Oral (10/06 1110) BP: 146/94 (10/06 1345) Pulse Rate: 113 (10/06 1345)  Labs: Recent Labs    08/04/20 1141  HGB 8.8*  HCT 29.7*  PLT 328  CREATININE 1.22*    Estimated Creatinine Clearance: 24.3 mL/min (A) (by C-G formula based on SCr of 1.22 mg/dL (H)).   Medical History: Past Medical History:  Diagnosis Date  . A-fib ([place])   . Acute CHF (congestive heart failure) ([place]) 09/02/2019  . Aortic stenosis, moderate    last echo in 2010; AVA 1.1; mild AS per echo in June 2013  . Edema of both legs    s/p laser treatment per Dr. Donnetta Hutching  . Hiatal hernia   . History of chicken pox   . HLD (hyperlipidemia)   . HTN (hypertension)   . Hypothyroidism   . IBS (irritable bowel syndrome)    . Iron deficiency anemia   . Neuropathic pain 09/06/2015  . Rheumatoid arthritis ([place])    Assessment: 81 yoF admitted 10/6 with dysuria, known fistula.  PHARMACY CONSULT: Heparin for VTE prophylaxis  Wt: 51 kg, BMI 20 Scr:  1.22, CrCl 24 ml/hr  CBC:  Hgb 8.8 is low/stable (last Hgb 7.5 in 05/2020), Plt WNL.   Plan:  Heparin 5000 units [place] q8h Pharmacy will sign off consult.  Gretta Arab PharmD, BCPS Clinical Pharmacist [facility] main pharmacy [phone] 08/04/2020 2:48 PM

## 2020-08-04 NOTE — Progress Notes (Signed)
Pharmacy Antibiotic Note  [name] is a 86 y.o. female admitted on 08/04/2020 with UTI.  Pharmacy has been consulted for Meropenem dosing for history of ESBL organisms.  Plan: Meropenem 500 mg IV q12h Follow up renal function, culture results, and clinical course.  Weight: 51.3 kg (113 lb)  Temp (24hrs), Avg:98.3 F (36.8 C), Min:98.3 F (36.8 C), Max:98.3 F (36.8 C)  Recent Labs  Lab 08/04/20 1141 08/04/20 1458  WBC 10.6* 9.4  CREATININE 1.22*  --   LATICACIDVEN 0.8  --     Estimated Creatinine Clearance: 24.3 mL/min (A) (by C-G formula based on SCr of 1.22 mg/dL (H)).    Allergies  Allergen Reactions  . Infliximab Other (See Comments)    REACTION: "Enlarged intestines and hernia. Also pushed intestines on lower part of lungs." REACTION: "Enlarged intestines and hernia. Also pushed intestines on lower part of lungs."  . Aspirin Nausea And Vomiting    Low Dose is ok Stomach problems  . Carvedilol Other (See Comments)    Fatigue/ ill Fatigue/ ill  . Codeine Nausea And Vomiting    "Deathly Sick"  . Contrast Media [Iodinated Diagnostic Agents] Other (See Comments)    All over body pain  . Penicillins Rash    Has patient had a PCN reaction causing immediate rash, facial/tongue/throat swelling, SOB or lightheadedness with hypotension: Y Has patient had a PCN reaction causing severe rash involving mucus membranes or skin necrosis: Y Has patient had a PCN reaction that required hospitalization: N Has patient had a PCN reaction occurring within the last 10 years: N If all of the above answers are "NO", then may proceed with Cephalosporin use.     Thank you for allowing pharmacy to be a part of this patient's care.  Gretta Arab PharmD, BCPS Clinical Pharmacist [facility] main pharmacy [phone] 08/04/2020 5:26 PM

## 2020-08-04 NOTE — ED Notes (Signed)
Pt ambulatory to restroom at this time. 

## 2020-08-04 NOTE — Progress Notes (Signed)
A consult was received from an ED physician for meropenem per pharmacy dosing.  The patient's profile has been reviewed for ht/wt/allergies/indication/available labs.    - scr 1.22 (crcl~24)  A one time order has been placed for meropenem 500 mg IV x1 .  Further antibiotics/pharmacy consults should be ordered by admitting physician if indicated.                       Thank you, Lynelle Doctor 08/04/2020  2:12 PM

## 2020-08-04 NOTE — Consult Note (Signed)
Urology Consult  Referring physician: Tyron Russell Reason for referral: UTI  Chief Complaint: UTI/fistula  History of Present Illness: I was consulted to assess the patient well-known to our service followed by Dr. Jeffie Pollock.  She has a colovesicle fistula and severe diverticular disease.  She is known to have an asymmetric thickened bladder and developed recurrent urinary tract infections requiring hospitalization from the colovesical fistula.  She is increasing and then stable moderate severe left-sided hydronephrosis.  Also has complex adnexal mass. she gets positive cultures treated.  She is not a surgical candidate and does not wish to have surgery.  In the last year she has had congestive heart failure and severe aortic stenosis and again is not an operative patient.   She was seen in our office July 15, 2020 and had a positive culture pan sensitive to every antibiotic tested.  She was given cefpodoxime   She came in today with lower abdominal pain.  She had some dry heaves.  Patient is tachycardic but afebrile.  Serum creatinine 1.22.  Hemoglobin 8.8.  White blood count 10.6.  CT scan today demonstrated stable left-sided hydronephrosis and hydroureter to the level of the upper pelvis similar to previous study.  Again has air in the bladder like she has in the past.  Findings in keeping with chronic fistula.  Still has 6 cm x 5 cm left adnexal mass.  Past Medical History:  Diagnosis Date  . A-fib ([place])   . Acute CHF (congestive heart failure) ([place]) 09/02/2019  . Aortic stenosis, moderate    last echo in 2010; AVA 1.1; mild AS per echo in June 2013  . Edema of both legs    s/p laser treatment per Dr. Donnetta Hutching  . Hiatal hernia   . History of chicken pox   . HLD (hyperlipidemia)   . HTN (hypertension)   . Hypothyroidism   . IBS (irritable bowel syndrome)   . Iron deficiency anemia   . Neuropathic pain 09/06/2015  . Rheumatoid arthritis [facility])    Past Surgical History:  Procedure  Laterality Date  . APPENDECTOMY  1955  . CARPAL TUNNEL RELEASE Right   . CATARACT EXTRACTION    . ENDOSCOPIC VEIN LASER TREATMENT    . ENDOVENOUS ABLATION SAPHENOUS VEIN W/ LASER  08-29-2012   left greater saphenous vein   Sherren Mocha Early MD  . ENDOVENOUS ABLATION SAPHENOUS VEIN W/ LASER  09-19-2012   right greater saphenous vein by Curt Jews MD  . EYE SURGERY  2005   Bilateral cataract  . FOOT SURGERY  2003   bilateral Hammer Toe  . stab phlebectomy Right 01-02-2013   10-15 incisions right thigh and calf by Curt Jews MD    Medications: I have reviewed the patient's current medications. Allergies:  Allergies  Allergen Reactions  . Infliximab Other (See Comments)    REACTION: "Enlarged intestines and hernia. Also pushed intestines on lower part of lungs." REACTION: "Enlarged intestines and hernia. Also pushed intestines on lower part of lungs."  . Aspirin Nausea And Vomiting    Low Dose is ok Stomach problems  . Carvedilol Other (See Comments)    Fatigue/ ill Fatigue/ ill  . Codeine Nausea And Vomiting    "Deathly Sick"  . Contrast Media [Iodinated Diagnostic Agents] Other (See Comments)    All over body pain  . Penicillins Rash    Has patient had a PCN reaction causing immediate rash, facial/tongue/throat swelling, SOB or lightheadedness with hypotension: Y Has patient had a PCN reaction causing  severe rash involving mucus membranes or skin necrosis: Y Has patient had a PCN reaction that required hospitalization: N Has patient had a PCN reaction occurring within the last 10 years: N If all of the above answers are "NO", then may proceed with Cephalosporin use.     Family History  Problem Relation Age of Onset  . Heart disease Mother   . Peripheral vascular disease Mother   . Heart disease Father   . Peripheral vascular disease Father        Right leg amputation  . COPD Father   . Heart disease Brother 73       Heart Disease before age 71  . Heart attack Brother   .  Peripheral vascular disease Other   . Hypertension Sister    Social History:  reports that she quit smoking about 36 years ago. Her smoking use included cigarettes. She has never used smokeless tobacco. She reports that she does not drink alcohol and does not use drugs.  ROS: All systems are reviewed and negative except as noted. Rest negative  Physical Exam:  Vital signs in last 24 hours: Temp:  [98.3 F (36.8 C)] 98.3 F (36.8 C) (10/06 1110) Pulse Rate:  [78-113] 96 (10/06 1600) Resp:  [16-22] 22 (10/06 1600) BP: (131-146)/(72-94) 138/82 (10/06 1600) SpO2:  [96 %-100 %] 100 % (10/06 1600) Weight:  [51.3 kg] 51.3 kg (10/06 1109)  Cardiovascular: Skin warm; not flushed Respiratory: Breaths quiet; no shortness of breath Abdomen: No masses Neurological: Normal sensation to touch Musculoskeletal: Normal motor function arms and legs Lymphatics: No inguinal adenopathy Skin: No rashes Genitourinary:no distress belly non tender  Laboratory Data:  Results for orders placed or performed during the hospital encounter of 08/04/20 (from the past 72 hour(s))  Urinalysis, Routine w reflex microscopic Urine, Catheterized     Status: Abnormal   Collection Time: 08/04/20 11:19 AM  Result Value Ref Range   Color, Urine YELLOW YELLOW   APPearance TURBID (A) CLEAR   Specific Gravity, Urine 1.019 1.005 - 1.030   pH 5.0 5.0 - 8.0   Glucose, UA NEGATIVE NEGATIVE mg/dL   Hgb urine dipstick MODERATE (A) NEGATIVE   Bilirubin Urine NEGATIVE NEGATIVE   Ketones, ur NEGATIVE NEGATIVE mg/dL   Protein, ur 100 (A) NEGATIVE mg/dL   Nitrite NEGATIVE NEGATIVE   Leukocytes,Ua LARGE (A) NEGATIVE   RBC / HPF >50 (H) 0 - 5 RBC/hpf   WBC, UA >50 (H) 0 - 5 WBC/hpf   Bacteria, UA FEW (A) NONE SEEN   WBC Clumps PRESENT     Comment: Performed at [facility], [place] [address], [place], [place] [address]  Lactic acid, plasma     Status: None   Collection Time: 08/04/20 11:41 AM  Result Value  Ref Range   Lactic Acid, Venous 0.8 0.5 - 1.9 mmol/L    Comment: Performed at [facility], [place] [address] [place], [place] [address]  Basic metabolic panel     Status: Abnormal   Collection Time: 08/04/20 11:41 AM  Result Value Ref Range   Sodium 138 135 - 145 mmol/L   Potassium 4.3 3.5 - 5.1 mmol/L   Chloride 104 98 - 111 mmol/L   CO2 22 22 - 32 mmol/L   Glucose, Bld 113 (H) 70 - 99 mg/dL    Comment: Glucose reference range applies only to samples taken after fasting for at least 8 hours.   BUN 29 (H) 8 - 23 mg/dL  Creatinine, Ser 1.22 (H) 0.44 - 1.00 mg/dL   Calcium 9.4 8.9 - 10.3 mg/dL   GFR calc non Af Amer 39 (L) >60 mL/min   Anion gap 12 5 - 15    Comment: Performed at [facility], [place] [address] [place], [place] [address]  CBC with Differential     Status: Abnormal   Collection Time: 08/04/20 11:41 AM  Result Value Ref Range   WBC 10.6 (H) 4.0 - 10.5 K/uL   RBC 3.63 (L) 3.87 - 5.11 MIL/uL   Hemoglobin 8.8 (L) 12.0 - 15.0 g/dL   HCT 29.7 (L) 36 - 46 %   MCV 81.8 80.0 - 100.0 fL   MCH 24.2 (L) 26.0 - 34.0 pg   MCHC 29.6 (L) 30.0 - 36.0 g/dL   RDW 20.4 (H) 11.5 - 15.5 %   Platelets 328 150 - 400 K/uL   nRBC 0.0 0.0 - 0.2 %   Neutrophils Relative % 73 %   Neutro Abs 7.8 (H) 1.7 - 7.7 K/uL   Lymphocytes Relative 16 %   Lymphs Abs 1.7 0.7 - 4.0 K/uL   Monocytes Relative 8 %   Monocytes Absolute 0.9 0 - 1 K/uL   Eosinophils Relative 2 %   Eosinophils Absolute 0.2 0 - 0 K/uL   Basophils Relative 1 %   Basophils Absolute 0.1 0 - 0 K/uL   Immature Granulocytes 0 %   Abs Immature Granulocytes 0.04 0.00 - 0.07 K/uL    Comment: Performed at [facility], [place] [address], [place], [place] [address]  Respiratory Panel by RT PCR (Flu A&B, Covid) - Nasopharyngeal Swab     Status: None   Collection Time: 08/04/20  2:58 PM   Specimen: Nasopharyngeal Swab  Result Value Ref Range   SARS Coronavirus 2 by RT PCR NEGATIVE  NEGATIVE    Comment: (NOTE) SARS-CoV-2 target nucleic acids are NOT DETECTED.  The SARS-CoV-2 RNA is generally detectable in upper respiratoy specimens during the acute phase of infection. The lowest concentration of SARS-CoV-2 viral copies this assay can detect is 131 copies/mL. A negative result does not preclude SARS-Cov-2 infection and should not be used as the sole basis for treatment or other patient management decisions. A negative result may occur with  improper specimen collection/handling, submission of specimen other than nasopharyngeal swab, presence of viral mutation(s) within the areas targeted by this assay, and inadequate number of viral copies (<131 copies/mL). A negative result must be combined with clinical observations, patient history, and epidemiological information. The expected result is Negative.  Fact Sheet for Patients:  PinkCheek.be  Fact Sheet for Healthcare Providers:  GravelBags.it  This test is no t yet approved or cleared by the [place] FDA and  has been authorized for detection and/or diagnosis of SARS-CoV-2 by FDA under an Emergency Use Authorization (EUA). This EUA will remain  in effect (meaning this test can be used) for the duration of the COVID-19 declaration under Section 564(b)(1) of the Act, 21 U.S.C. section 360bbb-3(b)(1), unless the authorization is terminated or revoked sooner.     Influenza A by PCR NEGATIVE NEGATIVE   Influenza B by PCR NEGATIVE NEGATIVE    Comment: (NOTE) The Xpert Xpress SARS-CoV-2/FLU/RSV assay is intended as an aid in  the diagnosis of influenza from Nasopharyngeal swab specimens and  should not be used as a sole basis for treatment. Nasal washings and  aspirates are unacceptable for Xpert Xpress SARS-CoV-2/FLU/RSV  testing.  Fact Sheet for Patients:  PinkCheek.be  Fact Sheet for Healthcare  Providers: GravelBags.it  This test is not yet approved or cleared by the [place] FDA and  has been authorized for detection and/or diagnosis of SARS-CoV-2 by  FDA under an Emergency Use Authorization (EUA). This EUA will remain  in effect (meaning this test can be used) for the duration of the  Covid-19 declaration under Section 564(b)(1) of the Act, 21  U.S.C. section 360bbb-3(b)(1), unless the authorization is  terminated or revoked. Performed at [facility], [place] [address] [place], [place] [address]   CBC     Status: Abnormal   Collection Time: 08/04/20  2:58 PM  Result Value Ref Range   WBC 9.4 4.0 - 10.5 K/uL   RBC 3.35 (L) 3.87 - 5.11 MIL/uL   Hemoglobin 8.2 (L) 12.0 - 15.0 g/dL   HCT 27.1 (L) 36 - 46 %   MCV 80.9 80.0 - 100.0 fL   MCH 24.5 (L) 26.0 - 34.0 pg   MCHC 30.3 30.0 - 36.0 g/dL   RDW 20.5 (H) 11.5 - 15.5 %   Platelets 316 150 - 400 K/uL   nRBC 0.0 0.0 - 0.2 %    Comment: Performed at [facility], [place] [address], [place], [place] [address]  Magnesium     Status: None   Collection Time: 08/04/20  2:58 PM  Result Value Ref Range   Magnesium 1.7 1.7 - 2.4 mg/dL    Comment: Performed at [facility], [place] [address], [place], [place] [address]  Phosphorus     Status: None   Collection Time: 08/04/20  2:58 PM  Result Value Ref Range   Phosphorus 3.7 2.5 - 4.6 mg/dL    Comment: Performed at [facility], [place] [address], [place], [name] [address]  TSH     Status: None   Collection Time: 08/04/20  2:58 PM  Result Value Ref Range   TSH 0.787 0.350 - 4.500 uIU/mL    Comment: Performed by a 3rd Generation assay with a functional sensitivity of <=0.01 uIU/mL. Performed at [facility], [place] [address] [place], [place] [address]    Recent Results (from the past 240 hour(s))  Respiratory Panel by RT PCR (Flu A&B, Covid) - Nasopharyngeal  Swab     Status: None   Collection Time: 08/04/20  2:58 PM   Specimen: Nasopharyngeal Swab  Result Value Ref Range Status   SARS Coronavirus 2 by RT PCR NEGATIVE NEGATIVE Final    Comment: (NOTE) SARS-CoV-2 target nucleic acids are NOT DETECTED.  The SARS-CoV-2 RNA is generally detectable in upper respiratoy specimens during the acute phase of infection. The lowest concentration of SARS-CoV-2 viral copies this assay can detect is 131 copies/mL. A negative result does not preclude SARS-Cov-2 infection and should not be used as the sole basis for treatment or other patient management decisions. A negative result may occur with  improper specimen collection/handling, submission of specimen other than nasopharyngeal swab, presence of viral mutation(s) within the areas targeted by this assay, and inadequate number of viral copies (<131 copies/mL). A negative result must be combined with clinical observations, patient history, and epidemiological information. The expected result is Negative.  Fact Sheet for Patients:  PinkCheek.be  Fact Sheet for Healthcare Providers:  GravelBags.it  This test is no t yet approved or cleared by the [place] FDA and  has been authorized for detection and/or diagnosis of SARS-CoV-2 by FDA under an Emergency Use Authorization (  EUA). This EUA will remain  in effect (meaning this test can be used) for the duration of the COVID-19 declaration under Section 564(b)(1) of the Act, 21 U.S.C. section 360bbb-3(b)(1), unless the authorization is terminated or revoked sooner.     Influenza A by PCR NEGATIVE NEGATIVE Final   Influenza B by PCR NEGATIVE NEGATIVE Final    Comment: (NOTE) The Xpert Xpress SARS-CoV-2/FLU/RSV assay is intended as an aid in  the diagnosis of influenza from Nasopharyngeal swab specimens and  should not be used as a sole basis for treatment. Nasal washings and  aspirates are  unacceptable for Xpert Xpress SARS-CoV-2/FLU/RSV  testing.  Fact Sheet for Patients: PinkCheek.be  Fact Sheet for Healthcare Providers: GravelBags.it  This test is not yet approved or cleared by the [place] FDA and  has been authorized for detection and/or diagnosis of SARS-CoV-2 by  FDA under an Emergency Use Authorization (EUA). This EUA will remain  in effect (meaning this test can be used) for the duration of the  Covid-19 declaration under Section 564(b)(1) of the Act, 21  U.S.C. section 360bbb-3(b)(1), unless the authorization is  terminated or revoked. Performed at [facility], [place] [address], [place],  [address]    Creatinine: Recent Labs    08/04/20 1141  CREATININE 1.22*    Xrays: See report/chart Noted above  Impression/Assessment:  The patient has recurrent urinary tract infection.  She is not a surgical candidate and does not wish to proceed with surgery.  Each infection should be treated based upon the culture results.  Recurrence rate is high but she will continue to have the infections treated as needed.  Plan:  No surgical intervention at this stage.  No need for stent or percutaneous drainage  Jaquon Gingerich A Kendrell Lottman 08/04/2020, 4:26 PM

## 2020-08-04 NOTE — ED Notes (Signed)
Pt updated on plan of care. Pt provided with cranberry juice.

## 2020-08-04 NOTE — ED Triage Notes (Signed)
Pt arrives via EMS from home with complaints of bladder pain ongoing for 3 years. Pt reports that the pain is worse and her PCP advised she come to ED for evaluation. Pt reports dysuria. Pt states " I have a hole in my bladder and they wanted to operate but I told them No not at my age" Pt denies fevers or chills at home.

## 2020-08-04 NOTE — H&P (Signed)
History and Physical   Patient: [name]                            PCP: [name], NP                    DOB: February 20, 1927            DOA: 08/04/2020 [id number]             DOS: 08/04/2020, 4:17 PM  Patient coming from:   HOME I have personally reviewed patient's medical records, in electronic medical records, including:  [place], and care everywhere.    Chief Complaint:   Chief Complaint  Patient presents with  . Dysuria    History of present illness:    [name] is a 86 y.o. female with medical history significant of with extensive past medical history including hydronephrosis vesicular fistula recurrent UTI presenting once again with dysuria. (Past medical history significant for paroxysmal A. fib, CHF, aortic stenosis, HLD, HTN, hypothyroidism, IBS, RA... Chronically O2 dependent 2 L at home) Currently lives independently with family at home.   Patient Denies having: Fever, Chills, Cough, SOB, Chest Pain, Abd pain, N/V/D, headache, dizziness, lightheadedness,  Dysuria, Joint pain, rash, open wounds  ED Course:   Hemodynamically stable, afebrile temp 98.3, CBC WBC 9.4, hemoglobin 8.2, hematocrit 27.1 CMP within normal limits exception of BUN 29, creatinine 1.22  UA positive for leukocyte Estrace > 50 WBCs Cultures were obtained -Based on previous culture of ESBL meropenem was initiated   CT abdomen pelvis-IMPRESSION: 1. Chronic wall thickening in the sigmoid colon likely indicative of chronic diverticulitis with apparent colovesical fistula which has been noted previously. Scattered diverticula elsewhere in the colon. No bowel obstruction. No free air or portal venous air.  2. Diffuse thickening of the urinary bladder wall persists with air in the bladder consistent with chronic colovesical fistula. This degree of urinary bladder wall thickening is likely of inflammatory etiology. Underlying urinary bladder neoplasm could  present in this manner.  3. Complex left adnexal region mass remains, similar to prior studies. This mass must be considered indeterminate for potential ovarian neoplastic lesion. 4. Aortic Atherosclerosis (ICD10-I70.0). Foci of coronary artery calcification as well as pelvic and mesenteric arterial calcification noted as well. 5.  Large hiatal hernia again noted. 6. Chronic hydronephrosis and ureterectasis   Admitted for complicated recurrent UTI  Review of Systems: As per HPI, otherwise 10 point review of systems were negative.   ----------------------------------------------------------------------------------------------------------------------  Allergies  Allergen Reactions  . Infliximab Other (See Comments)    REACTION: "Enlarged intestines and hernia. Also pushed intestines on lower part of lungs." REACTION: "Enlarged intestines and hernia. Also pushed intestines on lower part of lungs."  . Aspirin Nausea And Vomiting    Low Dose is ok Stomach problems  . Carvedilol Other (See Comments)    Fatigue/ ill Fatigue/ ill  . Codeine Nausea And Vomiting    "Deathly Sick"  . Contrast Media [Iodinated Diagnostic Agents] Other (See Comments)    All over body pain  . Penicillins Rash    Has patient had a PCN reaction causing immediate rash, facial/tongue/throat swelling, SOB or lightheadedness with hypotension: Y Has patient had a PCN reaction causing severe rash involving mucus membranes or skin necrosis: Y Has patient had a PCN reaction that required hospitalization: N Has patient had a PCN reaction occurring within the last 10 years: N If  all of the above answers are "NO", then may proceed with Cephalosporin use.     Home MEDs:  Prior to Admission medications   Medication Sig Start Date End Date Taking? Authorizing Provider  acetaminophen (TYLENOL) 500 MG tablet Take 1,000 mg by mouth every 8 (eight) hours as needed (joint pain).     [provider]    ALPRAZolam Duanne Moron) 0.25 MG tablet Take 1 tablet (0.25 mg total) by mouth at bedtime as needed for anxiety. FOR ANXIETY/RESTLESS LEGS 05/20/20   Nche, Charlene Brooke, NP  aspirin 81 MG chewable tablet Chew 1 tablet (81 mg total) by mouth daily. 10/26/18   Hennie Duos, MD  furosemide (LASIX) 20 MG tablet Take 1 tablet (20 mg total) by mouth daily as needed. 05/20/20   Nche, Charlene Brooke, NP  gabapentin (NEURONTIN) 100 MG capsule Take 100 mg by mouth 3 (three) times daily.    [provider]  levothyroxine (SYNTHROID) 75 MCG tablet TAKE 1 TABLET BY MOUTH ONCE DAILY BEFORE BREAKFAST 04/23/20   Nche, Charlene Brooke, NP  metoprolol succinate (TOPROL-XL) 25 MG 24 hr tablet Take 75 mg by mouth daily. 06/13/20   [provider]  Multiple Vitamin (MULTIVITAMIN WITH MINERALS) TABS tablet Take 1 tablet by mouth daily. 09/27/18   Raiford Noble Latif, DO  Olopatadine HCl 0.2 % SOLN Place 1 drop into both eyes daily. 02/25/20   Wille Celeste, PA-C  phenazopyridine (PYRIDIUM) 100 MG tablet Take 1 tablet (100 mg total) by mouth 3 (three) times daily with meals. 07/15/20   Nche, Charlene Brooke, NP  potassium chloride SA (KLOR-CON) 20 MEQ tablet Take 1 tablet (20 mEq total) by mouth daily as needed. 05/20/20   Nche, Charlene Brooke, NP    PRN MEDs: acetaminophen **OR** acetaminophen, albuterol, ALPRAZolam, iohexol, levalbuterol, ondansetron **OR** ondansetron (ZOFRAN) IV, senna-docusate, sorbitol, traZODone  Past Medical History:  Diagnosis Date  . A-fib ([place])   . Acute CHF (congestive heart failure) ([place]) 09/02/2019  . Aortic stenosis, moderate    last echo in 2010; AVA 1.1; mild AS per echo in June 2013  . Edema of both legs    s/p laser treatment per Dr. Donnetta Hutching  . Hiatal hernia   . History of chicken pox   . HLD (hyperlipidemia)   . HTN (hypertension)   . Hypothyroidism   . IBS (irritable bowel syndrome)   . Iron deficiency anemia   . Neuropathic pain 09/06/2015  . Rheumatoid arthritis  [facility])     Past Surgical History:  Procedure Laterality Date  . APPENDECTOMY  1955  . CARPAL TUNNEL RELEASE Right   . CATARACT EXTRACTION    . ENDOSCOPIC VEIN LASER TREATMENT    . ENDOVENOUS ABLATION SAPHENOUS VEIN W/ LASER  08-29-2012   left greater saphenous vein   Sherren Mocha Early MD  . ENDOVENOUS ABLATION SAPHENOUS VEIN W/ LASER  09-19-2012   right greater saphenous vein by Curt Jews MD  . EYE SURGERY  2005   Bilateral cataract  . FOOT SURGERY  2003   bilateral Hammer Toe  . stab phlebectomy Right 01-02-2013   10-15 incisions right thigh and calf by Curt Jews MD     reports that she quit smoking about 36 years ago. Her smoking use included cigarettes. She has never used smokeless tobacco. She reports that she does not drink alcohol and does not use drugs.   Family History  Problem Relation Age of Onset  . Heart disease Mother   . Peripheral vascular disease Mother   .  Heart disease Father   . Peripheral vascular disease Father        Right leg amputation  . COPD Father   . Heart disease Brother 29       Heart Disease before age 4  . Heart attack Brother   . Peripheral vascular disease Other   . Hypertension Sister     Physical Exam:   Vitals:   08/04/20 1430 08/04/20 1545 08/04/20 1559 08/04/20 1600  BP: 131/72 134/74 134/74 138/82  Pulse: 87 78 97 96  Resp: 17 20  (!) 22  Temp:      TempSrc:      SpO2: 100% 100%  100%  Weight:       Constitutional: NAD, calm, comfortable Eyes: PERRL, lids and conjunctivae normal ENMT: Mucous membranes are moist. Posterior pharynx clear of any exudate or lesions.Normal dentition.  Neck: normal, supple, no masses, no thyromegaly Respiratory: clear to auscultation bilaterally, no wheezing, no crackles. Normal respiratory effort. No accessory muscle use.  Cardiovascular: Regular rate and rhythm, no murmurs / rubs / gallops. No extremity edema. 2+ pedal pulses. No carotid bruits.  Abdomen: no tenderness, no masses palpated. No  hepatosplenomegaly. Bowel sounds positive.  Musculoskeletal: Generalized weaknesses, no clubbing / cyanosis. No joint deformity upper and lower extremities. Good ROM, no contractures. Normal muscle tone.  Neurologic: CN II-XII grossly intact. Sensation intact, DTR normal. Strength 5/5 in all 4.  Psychiatric: Normal judgment and insight. Alert and oriented x 3. Normal mood.  Skin: no rashes, lesions, ulcers. No induration  Wounds: per nursing documentation         Labs on admission:    I have personally reviewed following labs and imaging studies  CBC: Recent Labs  Lab 08/04/20 1141 08/04/20 1458  WBC 10.6* 9.4  NEUTROABS 7.8*  --   HGB 8.8* 8.2*  HCT 29.7* 27.1*  MCV 81.8 80.9  PLT 328 638   Basic Metabolic Panel: Recent Labs  Lab 08/04/20 1141 08/04/20 1458  NA 138  --   K 4.3  --   CL 104  --   CO2 22  --   GLUCOSE 113*  --   BUN 29*  --   CREATININE 1.22*  --   CALCIUM 9.4  --   MG  --  1.7  PHOS  --  3.7   GFR: Estimated Creatinine Clearance: 24.3 mL/min (A) (by C-G formula based on SCr of 1.22 mg/dL (H)). Liver Function Tests: No results for input(s): AST, ALT, ALKPHOS, BILITOT, PROT, ALBUMIN in the last 168 hours. No results for input(s): LIPASE, AMYLASE in the last 168 hours. No results for input(s): AMMONIA in the last 168 hours. Coagulation Profile: No results for input(s): INR, PROTIME in the last 168 hours. Cardiac Enzymes: No results for input(s): CKTOTAL, CKMB, CKMBINDEX, TROPONINI in the last 168 hours. BNP (last 3 results) No results for input(s): PROBNP in the last 8760 hours. HbA1C: No results for input(s): HGBA1C in the last 72 hours. CBG: No results for input(s): GLUCAP in the last 168 hours. Lipid Profile: No results for input(s): CHOL, HDL, LDLCALC, TRIG, CHOLHDL, LDLDIRECT in the last 72 hours. Thyroid Function Tests: No results for input(s): TSH, T4TOTAL, FREET4, T3FREE, THYROIDAB in the last 72 hours. Anemia Panel: No results  for input(s): VITAMINB12, FOLATE, FERRITIN, TIBC, IRON, RETICCTPCT in the last 72 hours. Urine analysis:    Component Value Date/Time   COLORURINE YELLOW 08/04/2020 1119   APPEARANCEUR TURBID (A) 08/04/2020 1119   LABSPEC 1.019 08/04/2020  [place] 5.0 08/04/2020 [place] 08/04/2020 1119   HGBUR MODERATE (A) 08/04/2020 [place] 08/04/2020 1119   [place] 08/04/2020 1119   PROTEINUR 100 (A) 08/04/2020 1119   UROBILINOGEN 0.2 07/03/2014 0313   NITRITE NEGATIVE 08/04/2020 1119   LEUKOCYTESUR LARGE (A) 08/04/2020 1119     Radiologic Exams on Admission:   CT ABDOMEN PELVIS WO CONTRAST  Result Date: 08/04/2020 CLINICAL DATA:  Lower abdominal pain.  Known colovesical fistula EXAM: CT ABDOMEN AND PELVIS WITHOUT CONTRAST TECHNIQUE: Multidetector CT imaging of the abdomen and pelvis was performed following the standard protocol without oral or IV contrast. COMPARISON:  CT abdomen and pelvis February 10, 2020 and June 18, 2020; earlier CT October 13, 2018 FINDINGS: Lower chest: No edema or airspace opacity. Large hiatal type hernia is again noted. Foci of coronary artery calcification evident. Hepatobiliary: No focal liver lesions are appreciable on this noncontrast enhanced study. Gallbladder wall does not appear appreciably thickened. There is no appreciable biliary duct dilatation. Pancreas: No a pancreatic mass or inflammatory focus. Spleen: No splenic lesions evident. Adrenals/Urinary Tract: Adrenals bilaterally appear normal. There is stable left hydronephrosis and ureterectasis to the level of the left upper pelvis, similar to prior study. No renal mass on either side. No hydronephrosis on the right. No renal or ureteral calculi evident on either side. The urinary bladder wall remains markedly thickened with air in the urinary bladder. Loss of fat plane between sigmoid colon and urinary bladder, felt to be consistent with colovesical fistula,  chronic. The air seen within the urinary bladder has been present previously and is felt to arise secondary to chronic colovesical fistula. Stomach/Bowel: There is extensive wall thickening throughout the sigmoid colon region, likely with chronic diverticulitis. Loss of fat plane between the sigmoid colon and bladder felt to represent chronic colovesical fistula. No new bowel inflammatory change evident. Scattered noninflamed diverticula noted throughout the colon. There is no appreciable bowel obstruction. The terminal ileal region appears normal. There is no pneumoperitoneum or portal venous air evident. Note that rectum is mildly distended with stool and air. No rectal wall thickening. Vascular/Lymphatic: There is no abdominal aortic aneurysm. There is extensive aortic and pelvic arterial vascular calcification as well as scattered foci of mesenteric arterial vascular calcification. No adenopathy evident in the abdomen or pelvis. Reproductive: Uterus is anteverted and canted to the right. Previously noted complex partially cystic left adnexal mass is again noted measuring 5.8 x 5.5 cm. No new pelvic mass evident. Other: Appendix absent. No abscess or ascites evident in the abdomen or pelvis. Fat noted in each inguinal ring. Musculoskeletal: There is extensive arthropathy throughout the lumbar spine. No blastic or lytic bone lesions are evident. No intramuscular or abdominal wall lesions. IMPRESSION: 1. Chronic wall thickening in the sigmoid colon likely indicative of chronic diverticulitis with apparent colovesical fistula which has been noted previously. Scattered diverticula elsewhere in the colon. No bowel obstruction. No free air or portal venous air. 2. Diffuse thickening of the urinary bladder wall persists with air in the bladder consistent with chronic colovesical fistula. This degree of urinary bladder wall thickening is likely of inflammatory etiology. Underlying urinary bladder neoplasm could present  in this manner. 3. Complex left adnexal region mass remains, similar to prior studies. This mass must be considered indeterminate for potential ovarian neoplastic lesion. 4. Aortic Atherosclerosis (ICD10-I70.0). Foci of coronary artery calcification as well as pelvic and mesenteric arterial calcification noted as well. 5.  Large  hiatal hernia again noted. 6. Chronic hydronephrosis and ureterectasis to the level of the upper pelvis on the left with probable stricture in the ureter in the upper pelvic region. This apparent stricture not well seen on noncontrast enhanced study. Electronically Signed   By: Lowella Grip III M.D.   On: 08/04/2020 14:03    EKG:   Independently reviewed.  Orders placed or performed during the hospital encounter of 08/04/20  . EKG 12-Lead   ---------------------------------------------------------------------------------------------------------------------------------------    Assessment / Plan:   Principal Problem:   Complicated UTI (urinary tract infection) Active Problems:   Aortic stenosis   Hypertension   Hyperlipidemia   Persistent atrial fibrillation (HCC)   Degenerative joint disease involving multiple joints   Hydronephrosis   Iron deficiency   Chronic combined systolic and diastolic congestive heart failure (HCC)   Principal Problem:   Complicated UTI (urinary tract infection) -with significant dysuria .  Chronic hydronephrosis -CT urogram reviewed, chronic changes-  chronic diverticulitis with apparent colovesical fistula  -No signs of sepsis -Urologist was called, case was discussed in detail -Cultures have been obtained -Based on previous urine culture, ESBL, Klebsiella... Patient has been initiated on meropenem which will be continued   Active Problems:   Aortic stenosis -stable, will monitor closely   Hypertension -stable continue home medication Lasix, Toprol,   Hyperlipidemia -not on any statins   P- atrial fibrillation (HCC)  -not chronically anticoagulated, on beta-blockers rate controlled   Degenerative joint disease involving multiple joints Hypothyroidism -continue Synthroid   Iron deficiency -monitoring H&H, check an iron level, likely restarting iron   Chronic combined systolic and diastolic congestive heart failure ([place]) -we will monitor for volume overload gentle IV fluid hydration for infection-then restarting Lasix     Cultures:  08/04/2020 blood/urine cultures   Antimicrobial: 08/04/2020 IV meropenem >>>   Consults called:  Urologist-Case was discussed CT was reviewed with urologist will see the patient accordingly -------------------------------------------------------------------------------------------------------------------------------------------- DVT prophylaxis: SCD/Compression stockings and Heparin SQ Code Status:   Code Status: Full Code   Admission status: Patient will be admitted as Inpatient, with a greater than 2 midnight length of stay.   Family Communication:  none at bedside  (The above findings and plan of care has been discussed with patient in detail, the patient expressed understanding and agreement of above plan)  --------------------------------------------------------------------------------------------------------------------------------------------------  Disposition Plan: >3 days Status is: Inpatient  Remains inpatient appropriate because:Inpatient level of care appropriate due to severity of illness needing IV antibiotics.. Subspecialty consultation and evaluation   Dispo: The patient is from: Home              Anticipated d/c is to: Home              Anticipated d/c date is: 3 days              Patient currently is not medically stable to d/c.     -------------------------------------------------------------------------------------------------------------------------------------------- Time spent: > than  80  Min.   SIGNED: Deatra James, MD, FACP,  FHM. [facility],  Pager (Please use [email or web address] to page to text)  If 7PM-7AM, please contact night-coverage [email or web address] 08/04/2020, 4:17 PM

## 2020-08-04 NOTE — ED Provider Notes (Signed)
[facility] DEPT Provider Note   CSN: [id number] Arrival date & time: 08/04/20  1038     History Chief Complaint  Patient presents with  . Dysuria    [name] is a 86 y.o. female.  HPI 86 year old female presents with dysuria.  She has a history of recurrent urinary tract infections and has a fistula that is contributing but has not been repaired due to patient declining.  She has not had a fever.  She has lower abdominal discomfort and states that since she has gotten out of the hospital last time, she has seen her urologist and was placed on a new antibiotic that she does not know the name of.  She took her last dose of it this morning.  Pain is severe, 10/10.  She has had some dry heaving.  No back pain.  Past Medical History:  Diagnosis Date  . A-fib ([place])   . Acute CHF (congestive heart failure) ([place]) 09/02/2019  . Aortic stenosis, moderate    last echo in 2010; AVA 1.1; mild AS per echo in June 2013  . Edema of both legs    s/p laser treatment per Dr. Donnetta Hutching  . Hiatal hernia   . History of chicken pox   . HLD (hyperlipidemia)   . HTN (hypertension)   . Hypothyroidism   . IBS (irritable bowel syndrome)   . Iron deficiency anemia   . Neuropathic pain 09/06/2015  . Rheumatoid arthritis [facility])     Patient Active Problem List   Diagnosis Date Noted  . GAD (generalized anxiety disorder) 05/20/2020  . Chronic combined systolic and diastolic congestive heart failure ([place]) 04/21/2020  . Hypoxia 04/21/2020  . Metabolic acidosis 16/55/3748  . Iron deficiency anemia 02/22/2020  . Cystitis 02/10/2020  . Aortic atherosclerosis ([place]) 09/16/2019  . Complicated UTI (urinary tract infection) 08/21/2019  . Colovesical fistula 05/28/2019  . Polyneuropathy 11/06/2018  . Goals of care, counseling/discussion   . Hydronephrosis 10/13/2018  . Ovarian mass 10/13/2018  . C. difficile colitis 10/13/2018  . Clostridium difficile colitis 09/24/2018    . Hypokalemia   . Hypomagnesemia   . Left ovarian cyst   . Acute diverticulitis 09/07/2018  . Leucocytosis 09/06/2018  . Malnutrition of moderate degree 07/26/2018  . Perforation of sigmoid colon due to diverticulitis 07/25/2018  . Sepsis ([place]) 07/24/2018  . Generalized abdominal pain 02/06/2018  . Paresthesia of both lower extremities 02/06/2018  . Normocytic normochromic anemia 01/07/2018  . Iron deficiency 09/19/2017  . Increased platelet count 09/18/2017  . Pyoderma gangrenosum 01/04/2017  . Primary osteoarthritis of both hands 01/04/2017  . Primary osteoarthritis of both feet 01/04/2017  . Primary osteoarthritis of both knees 01/04/2017  . Degenerative joint disease involving multiple joints 01/04/2017  . Neuropathic pain 09/06/2015  . Erythema nodosum 09/21/2014  . Rheumatoid arthritis ([place]) 09/21/2014  . Benign essential hypertension 09/02/2014  . Rectal bleeding 05/15/2014  . Persistent atrial fibrillation ([place]) 05/11/2014  . Hyperlipidemia 04/02/2014  . PAC (premature atrial contraction) 01/23/2014  . Varicose veins of lower extremities with other complications 27/04/8674  . Hypothyroid 06/21/2012  . Localized edema 06/21/2012  . Hypertension 04/19/2012  . Edema of lower extremity 01/17/2012  . Anxiety 11/05/2011  . Aortic stenosis 03/22/2011    Past Surgical History:  Procedure Laterality Date  . APPENDECTOMY  1955  . CARPAL TUNNEL RELEASE Right   . CATARACT EXTRACTION    . ENDOSCOPIC VEIN LASER TREATMENT    . ENDOVENOUS ABLATION SAPHENOUS VEIN W/  LASER  08-29-2012   left greater saphenous vein   Curt Jews MD  . ENDOVENOUS ABLATION SAPHENOUS VEIN W/ LASER  09-19-2012   right greater saphenous vein by Curt Jews MD  . EYE SURGERY  2005   Bilateral cataract  . FOOT SURGERY  2003   bilateral Hammer Toe  . stab phlebectomy Right 01-02-2013   10-15 incisions right thigh and calf by Curt Jews MD     OB History   No obstetric history on file.      Family History  Problem Relation Age of Onset  . Heart disease Mother   . Peripheral vascular disease Mother   . Heart disease Father   . Peripheral vascular disease Father        Right leg amputation  . COPD Father   . Heart disease Brother 43       Heart Disease before age 68  . Heart attack Brother   . Peripheral vascular disease Other   . Hypertension Sister     Social History   Tobacco Use  . Smoking status: Former Smoker    Types: Cigarettes    Quit date: 10/31/1983    Years since quitting: 36.7  . Smokeless tobacco: Never Used  Vaping Use  . Vaping Use: Never used  Substance Use Topics  . Alcohol use: No  . Drug use: No    Home Medications Prior to Admission medications   Medication Sig Start Date End Date Taking? Authorizing Provider  acetaminophen (TYLENOL) 500 MG tablet Take 1,000 mg by mouth every 8 (eight) hours as needed (joint pain).     [provider]  ALPRAZolam Duanne Moron) 0.25 MG tablet Take 1 tablet (0.25 mg total) by mouth at bedtime as needed for anxiety. FOR ANXIETY/RESTLESS LEGS 05/20/20   Nche, Charlene Brooke, NP  aspirin 81 MG chewable tablet Chew 1 tablet (81 mg total) by mouth daily. 10/26/18   Hennie Duos, MD  furosemide (LASIX) 20 MG tablet Take 1 tablet (20 mg total) by mouth daily as needed. 05/20/20   Nche, Charlene Brooke, NP  gabapentin (NEURONTIN) 100 MG capsule Take 100 mg by mouth 3 (three) times daily.    [provider]  levothyroxine (SYNTHROID) 75 MCG tablet TAKE 1 TABLET BY MOUTH ONCE DAILY BEFORE BREAKFAST 04/23/20   Nche, Charlene Brooke, NP  metoprolol succinate (TOPROL-XL) 25 MG 24 hr tablet Take 75 mg by mouth daily. 06/13/20   [provider]  Multiple Vitamin (MULTIVITAMIN WITH MINERALS) TABS tablet Take 1 tablet by mouth daily. 09/27/18   Raiford Noble Latif, DO  Olopatadine HCl 0.2 % SOLN Place 1 drop into both eyes daily. 02/25/20   Wille Celeste, PA-C  phenazopyridine (PYRIDIUM) 100 MG tablet Take  1 tablet (100 mg total) by mouth 3 (three) times daily with meals. 07/15/20   Nche, Charlene Brooke, NP  potassium chloride SA (KLOR-CON) 20 MEQ tablet Take 1 tablet (20 mEq total) by mouth daily as needed. 05/20/20   Nche, Charlene Brooke, NP    Allergies    Infliximab, Aspirin, Carvedilol, Codeine, Contrast media [iodinated diagnostic agents], and Penicillins  Review of Systems   Review of Systems  Constitutional: Negative for fever.  Gastrointestinal: Positive for abdominal pain and vomiting.  Genitourinary: Positive for dysuria.  Musculoskeletal: Negative for back pain.  All other systems reviewed and are negative.   Physical Exam Updated Vital Signs BP (!) 146/94   Pulse (!) 113   Temp 98.3 F (36.8 C) (Oral)  Resp (!) 21   Wt 51.3 kg   SpO2 96%   BMI 20.02 kg/m   Physical Exam Vitals and nursing note reviewed.  Constitutional:      Appearance: She is well-developed.  HENT:     Head: Normocephalic and atraumatic.     Right Ear: External ear normal.     Left Ear: External ear normal.     Nose: Nose normal.  Eyes:     General:        Right eye: No discharge.        Left eye: No discharge.  Cardiovascular:     Rate and Rhythm: Normal rate and regular rhythm.     Heart sounds: Normal heart sounds.  Pulmonary:     Effort: Pulmonary effort is normal.     Breath sounds: Normal breath sounds.  Abdominal:     Palpations: Abdomen is soft.     Tenderness: There is abdominal tenderness in the suprapubic area.  Skin:    General: Skin is warm and dry.  Neurological:     Mental Status: She is alert.  Psychiatric:        Mood and Affect: Mood is not anxious.     ED Results / Procedures / Treatments   Labs (all labs ordered are listed, but only abnormal results are displayed) Labs Reviewed  URINALYSIS, ROUTINE W REFLEX MICROSCOPIC - Abnormal; Notable for the following components:      Result Value   APPearance TURBID (*)    Hgb urine dipstick MODERATE (*)    Protein,  ur 100 (*)    Leukocytes,Ua LARGE (*)    RBC / HPF >50 (*)    WBC, UA >50 (*)    Bacteria, UA FEW (*)    All other components within normal limits  BASIC METABOLIC PANEL - Abnormal; Notable for the following components:   Glucose, Bld 113 (*)    BUN 29 (*)    Creatinine, Ser 1.22 (*)    GFR calc non Af Amer 39 (*)    All other components within normal limits  CBC WITH DIFFERENTIAL/PLATELET - Abnormal; Notable for the following components:   WBC 10.6 (*)    RBC 3.63 (*)    Hemoglobin 8.8 (*)    HCT 29.7 (*)    MCH 24.2 (*)    MCHC 29.6 (*)    RDW 20.4 (*)    Neutro Abs 7.8 (*)    All other components within normal limits  URINE CULTURE  RESPIRATORY PANEL BY RT PCR (FLU A&B, COVID)  LACTIC ACID, PLASMA  CBC  MAGNESIUM  PHOSPHORUS  BRAIN NATRIURETIC PEPTIDE  TSH  HEMOGLOBIN A1C    EKG None  Radiology CT ABDOMEN PELVIS WO CONTRAST  Result Date: 08/04/2020 CLINICAL DATA:  Lower abdominal pain.  Known colovesical fistula EXAM: CT ABDOMEN AND PELVIS WITHOUT CONTRAST TECHNIQUE: Multidetector CT imaging of the abdomen and pelvis was performed following the standard protocol without oral or IV contrast. COMPARISON:  CT abdomen and pelvis February 10, 2020 and June 18, 2020; earlier CT October 13, 2018 FINDINGS: Lower chest: No edema or airspace opacity. Large hiatal type hernia is again noted. Foci of coronary artery calcification evident. Hepatobiliary: No focal liver lesions are appreciable on this noncontrast enhanced study. Gallbladder wall does not appear appreciably thickened. There is no appreciable biliary duct dilatation. Pancreas: No a pancreatic mass or inflammatory focus. Spleen: No splenic lesions evident. Adrenals/Urinary Tract: Adrenals bilaterally appear normal. There is stable left hydronephrosis and  ureterectasis to the level of the left upper pelvis, similar to prior study. No renal mass on either side. No hydronephrosis on the right. No renal or ureteral calculi  evident on either side. The urinary bladder wall remains markedly thickened with air in the urinary bladder. Loss of fat plane between sigmoid colon and urinary bladder, felt to be consistent with colovesical fistula, chronic. The air seen within the urinary bladder has been present previously and is felt to arise secondary to chronic colovesical fistula. Stomach/Bowel: There is extensive wall thickening throughout the sigmoid colon region, likely with chronic diverticulitis. Loss of fat plane between the sigmoid colon and bladder felt to represent chronic colovesical fistula. No new bowel inflammatory change evident. Scattered noninflamed diverticula noted throughout the colon. There is no appreciable bowel obstruction. The terminal ileal region appears normal. There is no pneumoperitoneum or portal venous air evident. Note that rectum is mildly distended with stool and air. No rectal wall thickening. Vascular/Lymphatic: There is no abdominal aortic aneurysm. There is extensive aortic and pelvic arterial vascular calcification as well as scattered foci of mesenteric arterial vascular calcification. No adenopathy evident in the abdomen or pelvis. Reproductive: Uterus is anteverted and canted to the right. Previously noted complex partially cystic left adnexal mass is again noted measuring 5.8 x 5.5 cm. No new pelvic mass evident. Other: Appendix absent. No abscess or ascites evident in the abdomen or pelvis. Fat noted in each inguinal ring. Musculoskeletal: There is extensive arthropathy throughout the lumbar spine. No blastic or lytic bone lesions are evident. No intramuscular or abdominal wall lesions. IMPRESSION: 1. Chronic wall thickening in the sigmoid colon likely indicative of chronic diverticulitis with apparent colovesical fistula which has been noted previously. Scattered diverticula elsewhere in the colon. No bowel obstruction. No free air or portal venous air. 2. Diffuse thickening of the urinary bladder  wall persists with air in the bladder consistent with chronic colovesical fistula. This degree of urinary bladder wall thickening is likely of inflammatory etiology. Underlying urinary bladder neoplasm could present in this manner. 3. Complex left adnexal region mass remains, similar to prior studies. This mass must be considered indeterminate for potential ovarian neoplastic lesion. 4. Aortic Atherosclerosis (ICD10-I70.0). Foci of coronary artery calcification as well as pelvic and mesenteric arterial calcification noted as well. 5.  Large hiatal hernia again noted. 6. Chronic hydronephrosis and ureterectasis to the level of the upper pelvis on the left with probable stricture in the ureter in the upper pelvic region. This apparent stricture not well seen on noncontrast enhanced study. Electronically Signed   By: Lowella Grip III M.D.   On: 08/04/2020 14:03    Procedures Procedures (including critical care time)  Medications Ordered in ED Medications  iohexol (OMNIPAQUE) 300 MG/ML solution 75 mL ( Intravenous Canceled Entry 08/04/20 1348)  sodium chloride (PF) 0.9 % injection (has no administration in time range)  meropenem (MERREM) 500 mg in sodium chloride 0.9 % 100 mL IVPB (has no administration in time range)  heparin injection 5,000 Units (has no administration in time range)  sodium chloride flush (NS) 0.9 % injection 3 mL (has no administration in time range)  0.9 %  sodium chloride infusion (has no administration in time range)  traZODone (DESYREL) tablet 50 mg (has no administration in time range)  docusate sodium (COLACE) capsule 100 mg (has no administration in time range)  senna-docusate (Senokot-S) tablet 1 tablet (has no administration in time range)  sorbitol 70 % solution 30 mL (has no administration in  time range)  ondansetron (ZOFRAN) tablet 4 mg (has no administration in time range)    Or  ondansetron (ZOFRAN) injection 4 mg (has no administration in time range)   albuterol (PROVENTIL) (2.5 MG/3ML) 0.083% nebulizer solution 2.5 mg (has no administration in time range)  levalbuterol (XOPENEX) nebulizer solution 0.63 mg (has no administration in time range)  acetaminophen (TYLENOL) tablet 650 mg (has no administration in time range)    Or  acetaminophen (TYLENOL) suppository 650 mg (has no administration in time range)  sodium chloride 0.9 % bolus 500 mL (0 mLs Intravenous Stopped 08/04/20 1300)  fentaNYL (SUBLIMAZE) injection 50 mcg (50 mcg Intravenous Given 08/04/20 1153)    ED Course  I have reviewed the triage vital signs and the nursing notes.  Pertinent labs & imaging results that were available during my care of the patient were reviewed by me and considered in my medical decision making (see chart for details).    MDM Rules/Calculators/A&P                          Patient is not ill-appearing or septic though she does appear to have a recurrent urinary tract infection that did not respond to outpatient antibiotics. CT was obtained that does not show any other acute intra-abdominal process. Labs do show a leukocytosis as well as UTI. Given she failed outpatient antibiotics (Vantin according to pharmacy tech who has reviewed her most recent medicines), I think she will need IV antibiotics. She has a history of ESBL so will be given meropenem. Hospitalist to admit.  Final Clinical Impression(s) / ED Diagnoses Final diagnoses:  Recurrent UTI (urinary tract infection)    Rx / DC Orders ED Discharge Orders    None       Sherwood Gambler, MD 08/04/20 1442

## 2020-08-05 DIAGNOSIS — I5042 Chronic combined systolic (congestive) and diastolic (congestive) heart failure: Secondary | ICD-10-CM

## 2020-08-05 DIAGNOSIS — M158 Other polyosteoarthritis: Secondary | ICD-10-CM

## 2020-08-05 LAB — CBC
HCT: 25.9 % — ABNORMAL LOW (ref 36.0–46.0)
Hemoglobin: 7.8 g/dL — ABNORMAL LOW (ref 12.0–15.0)
MCH: 24.8 pg — ABNORMAL LOW (ref 26.0–34.0)
MCHC: 30.1 g/dL (ref 30.0–36.0)
MCV: 82.2 fL (ref 80.0–100.0)
Platelets: 261 10*3/uL (ref 150–400)
RBC: 3.15 MIL/uL — ABNORMAL LOW (ref 3.87–5.11)
RDW: 20.4 % — ABNORMAL HIGH (ref 11.5–15.5)
WBC: 8.3 10*3/uL (ref 4.0–10.5)
nRBC: 0 % (ref 0.0–0.2)

## 2020-08-05 LAB — BASIC METABOLIC PANEL
Anion gap: 6 (ref 5–15)
BUN: 27 mg/dL — ABNORMAL HIGH (ref 8–23)
CO2: 22 mmol/L (ref 22–32)
Calcium: 8.6 mg/dL — ABNORMAL LOW (ref 8.9–10.3)
Chloride: 110 mmol/L (ref 98–111)
Creatinine, Ser: 1.37 mg/dL — ABNORMAL HIGH (ref 0.44–1.00)
GFR calc non Af Amer: 34 mL/min — ABNORMAL LOW (ref >60)
Glucose, Bld: 104 mg/dL — ABNORMAL HIGH (ref 70–99)
Potassium: 4.5 mmol/L (ref 3.5–5.1)
Sodium: 138 mmol/L (ref 135–145)

## 2020-08-05 LAB — PROTIME-INR
INR: 1.1 (ref 0.8–1.2)
Prothrombin Time: 14.2 seconds (ref 11.4–15.2)

## 2020-08-05 LAB — GLUCOSE, CAPILLARY
Glucose-Capillary: 129 mg/dL — ABNORMAL HIGH (ref 70–99)
Glucose-Capillary: 89 mg/dL (ref 70–99)

## 2020-08-05 LAB — APTT: aPTT: 34 seconds (ref 24–36)

## 2020-08-05 MED ORDER — HEPARIN SODIUM (PORCINE) 5000 UNIT/ML IJ SOLN
5000.0000 [IU] | Freq: Three times a day (TID) | INTRAMUSCULAR | Status: DC
  Administered 2020-08-05 – 2020-08-07 (×6): 5000 [IU] via SUBCUTANEOUS
  Filled 2020-08-05 (×7): qty 1

## 2020-08-05 NOTE — Progress Notes (Signed)
PROGRESS NOTE    [name]  GBT:[id number] DOB: 08/08/1927 DOA: 08/04/2020 PCP: [name], NP    Brief Narrative:  86 y.o. female with medical history significant of with extensive past medical history including hydronephrosis vesicular fistula recurrent UTI presenting once again with dysuria. (Past medical history significant for paroxysmal A. fib, CHF, aortic stenosis, HLD, HTN, hypothyroidism, IBS, RA... Chronically O2 dependent 2 L at home) Currently lives independently with family at home.   Patient Denies having: Fever, Chills, Cough, SOB, Chest Pain, Abd pain, N/V/D, headache, dizziness, lightheadedness,  Dysuria, Joint pain, rash, open wounds  ED Course:   Hemodynamically stable, afebrile temp 98.3, CBC WBC 9.4, hemoglobin 8.2, hematocrit 27.1 CMP within normal limits exception of BUN 29, creatinine 1.22  UA positive for leukocyte Estrace > 50 WBCs Cultures were obtained -Based on previous culture of ESBL meropenem was initiated  Assessment & Plan:   Principal Problem:   Complicated UTI (urinary tract infection) Active Problems:   Aortic stenosis   Hypertension   Hyperlipidemia   Persistent atrial fibrillation (HCC)   Degenerative joint disease involving multiple joints   Hydronephrosis   Iron deficiency   Chronic combined systolic and diastolic congestive heart failure (HCC)   Principal Problem:   Complicated UTI (urinary tract infection) -with significant dysuria .  Chronic hydronephrosis -CT urogram with chronic changes-  chronic diverticulitis with apparent colovesical fistula noted -Urologist consulted, appreciate input. Recommendation to treat UTI and to tailor abx accordingly based on culture results -Pending urine culture results -Based on previous urine culture, ESBL, Klebsiella... Patient was started on meropenem which will be continued   Active Problems:   Aortic stenosis -stable, systolic murmur on exam. Most recent 2d echo  from 2020 with AV not assessed on that study, reviewed    Hypertension -stable continue home medication Lasix, Toprol as tolerated. BP controlled    Hyperlipidemia -not on any statins    P- atrial fibrillation (HCC) -not chronically anticoagulated, continue on beta-blockers rate controlled    Degenerative joint disease involving multiple joints  Hypothyroidism -continue Synthroid per home regimen    Iron deficiency - Iron level noted to be within normal limits at 44 with normal ferritin    Chronic combined systolic and diastolic congestive heart failure (HCC) -most recent echo in 2020 reviewed with EF of 35-40%. Currently clinically euvolemic   DVT prophylaxis: Heparin sub q Code Status: Full Family Communication: Pt in room, family not at bedside  Status is: Inpatient  Remains inpatient appropriate because:IV treatments appropriate due to intensity of illness or inability to take PO   Dispo: The patient is from: Home              Anticipated d/c is to: Home              Anticipated d/c date is: 2 days              Patient currently is not medically stable to d/c.       Consultants:   Urology  Procedures:     Antimicrobials: Anti-infectives (From admission, onward)   Start     Dose/Rate Route Frequency Ordered Stop   08/05/20 0200  meropenem (MERREM) 500 mg in sodium chloride 0.9 % 100 mL IVPB        500 mg 200 mL/hr over 30 Minutes Intravenous Every 12 hours 08/04/20 1727     08/04/20 1415  meropenem (MERREM) 500 mg in sodium chloride 0.9 % 100 mL IVPB  500 mg 200 mL/hr over 30 Minutes Intravenous  Once 08/04/20 1414 08/04/20 1527       Subjective: Hoping to go home soon  Objective: Vitals:   08/05/20 0233 08/05/20 0635 08/05/20 0952 08/05/20 1336  BP: 106/72 (!) 109/51 134/83 134/70  Pulse: 75 64 86 91  Resp: 14 16 17 16   Temp: 97.8 F (36.6 C) 98 F (36.7 C) 98.2 F (36.8 C) 98.1 F (36.7 C)  TempSrc: Oral Oral Oral Oral  SpO2: 100%  100%    Weight:      Height:        Intake/Output Summary (Last 24 hours) at 08/05/2020 1609 Last data filed at 08/05/2020 1336 Gross per 24 hour  Intake 1617.73 ml  Output 0 ml  Net 1617.73 ml   Filed Weights   08/04/20 1109  Weight: 51.3 kg    Examination:  General exam: Appears calm and comfortable  Respiratory system: Clear to auscultation. Respiratory effort normal. Cardiovascular system: S1 & S2 heard, Regular Gastrointestinal system: Abdomen is nondistended, soft and nontender. No organomegaly or masses felt. Normal bowel sounds heard. Central nervous system: Alert and oriented. No focal neurological deficits. Extremities: Symmetric 5 x 5 power. Skin: No rashes, lesions  Psychiatry: Judgement and insight appear normal. Mood & affect appropriate.   Data Reviewed: I have personally reviewed following labs and imaging studies  CBC: Recent Labs  Lab 08/04/20 1141 08/04/20 1458 08/05/20 0522  WBC 10.6* 9.4 8.3  NEUTROABS 7.8*  --   --   HGB 8.8* 8.2* 7.8*  HCT 29.7* 27.1* 25.9*  MCV 81.8 80.9 82.2  PLT 328 316 350   Basic Metabolic Panel: Recent Labs  Lab 08/04/20 1141 08/04/20 1458 08/05/20 0522  NA 138  --  138  K 4.3  --  4.5  CL 104  --  110  CO2 22  --  22  GLUCOSE 113*  --  104*  BUN 29*  --  27*  CREATININE 1.22*  --  1.37*  CALCIUM 9.4  --  8.6*  MG  --  1.7  --   PHOS  --  3.7  --    GFR: Estimated Creatinine Clearance: 18.2 mL/min (A) (by C-G formula based on SCr of 1.37 mg/dL (H)). Liver Function Tests: No results for input(s): AST, ALT, ALKPHOS, BILITOT, PROT, ALBUMIN in the last 168 hours. No results for input(s): LIPASE, AMYLASE in the last 168 hours. No results for input(s): AMMONIA in the last 168 hours. Coagulation Profile: Recent Labs  Lab 08/05/20 0522  INR 1.1   Cardiac Enzymes: No results for input(s): CKTOTAL, CKMB, CKMBINDEX, TROPONINI in the last 168 hours. BNP (last 3 results) No results for input(s): PROBNP in the  last 8760 hours. HbA1C: Recent Labs    08/04/20 1557  HGBA1C 5.7*   CBG: Recent Labs  Lab 08/05/20 0631 08/05/20 0742  GLUCAP 129* 89   Lipid Profile: No results for input(s): CHOL, HDL, LDLCALC, TRIG, CHOLHDL, LDLDIRECT in the last 72 hours. Thyroid Function Tests: Recent Labs    08/04/20 1458  TSH 0.787   Anemia Panel: Recent Labs    08/04/20 1458  TIBC 376  IRON 44   Sepsis Labs: Recent Labs  Lab 08/04/20 1141  LATICACIDVEN 0.8    Recent Results (from the past 240 hour(s))  Urine culture     Status: Abnormal (Preliminary result)   Collection Time: 08/04/20 11:19 AM   Specimen: Urine, Catheterized  Result Value Ref Range Status   Specimen  Description   Final    URINE, CATHETERIZED Performed at [facility], [place] [address] [place], [place] [address]    Special Requests   Final    Normal Performed at [facility], [place] [address] [place], [place] [address]    Culture (A)  Final    >=100,000 COLONIES/mL KLEBSIELLA OXYTOCA SUSCEPTIBILITIES TO FOLLOW Performed at [facility], [place] [address] [place], [place] [address]    Report Status PENDING  Incomplete  Respiratory Panel by RT PCR (Flu A&B, Covid) - Nasopharyngeal Swab     Status: None   Collection Time: 08/04/20  2:58 PM   Specimen: Nasopharyngeal Swab  Result Value Ref Range Status   SARS Coronavirus 2 by RT PCR NEGATIVE NEGATIVE Final    Comment: (NOTE) SARS-CoV-2 target nucleic acids are NOT DETECTED.  The SARS-CoV-2 RNA is generally detectable in upper respiratoy specimens during the acute phase of infection. The lowest concentration of SARS-CoV-2 viral copies this assay can detect is 131 copies/mL. A negative result does not preclude SARS-Cov-2 infection and should not be used as the sole basis for treatment or other patient management decisions. A negative result may occur with  improper specimen collection/handling, submission of specimen  other than nasopharyngeal swab, presence of viral mutation(s) within the areas targeted by this assay, and inadequate number of viral copies (<131 copies/mL). A negative result must be combined with clinical observations, patient history, and epidemiological information. The expected result is Negative.  Fact Sheet for Patients:  PinkCheek.be  Fact Sheet for Healthcare Providers:  GravelBags.it  This test is no t yet approved or cleared by the [place] FDA and  has been authorized for detection and/or diagnosis of SARS-CoV-2 by FDA under an Emergency Use Authorization (EUA). This EUA will remain  in effect (meaning this test can be used) for the duration of the COVID-19 declaration under Section 564(b)(1) of the Act, 21 U.S.C. section 360bbb-3(b)(1), unless the authorization is terminated or revoked sooner.     Influenza A by PCR NEGATIVE NEGATIVE Final   Influenza B by PCR NEGATIVE NEGATIVE Final    Comment: (NOTE) The Xpert Xpress SARS-CoV-2/FLU/RSV assay is intended as an aid in  the diagnosis of influenza from Nasopharyngeal swab specimens and  should not be used as a sole basis for treatment. Nasal washings and  aspirates are unacceptable for Xpert Xpress SARS-CoV-2/FLU/RSV  testing.  Fact Sheet for Patients: PinkCheek.be  Fact Sheet for Healthcare Providers: GravelBags.it  This test is not yet approved or cleared by the [place] FDA and  has been authorized for detection and/or diagnosis of SARS-CoV-2 by  FDA under an Emergency Use Authorization (EUA). This EUA will remain  in effect (meaning this test can be used) for the duration of the  Covid-19 declaration under Section 564(b)(1) of the Act, 21  U.S.C. section 360bbb-3(b)(1), unless the authorization is  terminated or revoked. Performed at [facility], [place] [address] [place], [place] [address]      Radiology Studies: CT ABDOMEN PELVIS WO CONTRAST  Result Date: 08/04/2020 CLINICAL DATA:  Lower abdominal pain.  Known colovesical fistula EXAM: CT ABDOMEN AND PELVIS WITHOUT CONTRAST TECHNIQUE: Multidetector CT imaging of the abdomen and pelvis was performed following the standard protocol without oral or IV contrast. COMPARISON:  CT abdomen and pelvis February 10, 2020 and June 18, 2020; earlier CT October 13, 2018 FINDINGS: Lower chest: No edema or airspace opacity. Large hiatal type hernia is again noted.  Foci of coronary artery calcification evident. Hepatobiliary: No focal liver lesions are appreciable on this noncontrast enhanced study. Gallbladder wall does not appear appreciably thickened. There is no appreciable biliary duct dilatation. Pancreas: No a pancreatic mass or inflammatory focus. Spleen: No splenic lesions evident. Adrenals/Urinary Tract: Adrenals bilaterally appear normal. There is stable left hydronephrosis and ureterectasis to the level of the left upper pelvis, similar to prior study. No renal mass on either side. No hydronephrosis on the right. No renal or ureteral calculi evident on either side. The urinary bladder wall remains markedly thickened with air in the urinary bladder. Loss of fat plane between sigmoid colon and urinary bladder, felt to be consistent with colovesical fistula, chronic. The air seen within the urinary bladder has been present previously and is felt to arise secondary to chronic colovesical fistula. Stomach/Bowel: There is extensive wall thickening throughout the sigmoid colon region, likely with chronic diverticulitis. Loss of fat plane between the sigmoid colon and bladder felt to represent chronic colovesical fistula. No new bowel inflammatory change evident. Scattered noninflamed diverticula noted throughout the colon. There is no appreciable bowel obstruction. The terminal ileal region appears normal. There is no  pneumoperitoneum or portal venous air evident. Note that rectum is mildly distended with stool and air. No rectal wall thickening. Vascular/Lymphatic: There is no abdominal aortic aneurysm. There is extensive aortic and pelvic arterial vascular calcification as well as scattered foci of mesenteric arterial vascular calcification. No adenopathy evident in the abdomen or pelvis. Reproductive: Uterus is anteverted and canted to the right. Previously noted complex partially cystic left adnexal mass is again noted measuring 5.8 x 5.5 cm. No new pelvic mass evident. Other: Appendix absent. No abscess or ascites evident in the abdomen or pelvis. Fat noted in each inguinal ring. Musculoskeletal: There is extensive arthropathy throughout the lumbar spine. No blastic or lytic bone lesions are evident. No intramuscular or abdominal wall lesions. IMPRESSION: 1. Chronic wall thickening in the sigmoid colon likely indicative of chronic diverticulitis with apparent colovesical fistula which has been noted previously. Scattered diverticula elsewhere in the colon. No bowel obstruction. No free air or portal venous air. 2. Diffuse thickening of the urinary bladder wall persists with air in the bladder consistent with chronic colovesical fistula. This degree of urinary bladder wall thickening is likely of inflammatory etiology. Underlying urinary bladder neoplasm could present in this manner. 3. Complex left adnexal region mass remains, similar to prior studies. This mass must be considered indeterminate for potential ovarian neoplastic lesion. 4. Aortic Atherosclerosis (ICD10-I70.0). Foci of coronary artery calcification as well as pelvic and mesenteric arterial calcification noted as well. 5.  Large hiatal hernia again noted. 6. Chronic hydronephrosis and ureterectasis to the level of the upper pelvis on the left with probable stricture in the ureter in the upper pelvic region. This apparent stricture not well seen on noncontrast  enhanced study. Electronically Signed   By: Lowella Grip III M.D.   On: 08/04/2020 14:03    Scheduled Meds: . aspirin  81 mg Oral Daily  . docusate sodium  100 mg Oral BID  . furosemide  20 mg Oral Daily  . gabapentin  100 mg Oral TID  . heparin injection (subcutaneous)  5,000 Units Subcutaneous Q8H  . levothyroxine  75 mcg Oral Q0600  . metoprolol succinate  75 mg Oral Daily  . multivitamin with minerals  1 tablet Oral Daily  . olopatadine  1 drop Both Eyes BID  . pentosan polysulfate  100 mg Oral TID  .  sodium chloride flush  3 mL Intravenous Q12H   Continuous Infusions: . meropenem (MERREM) IV 500 mg (08/05/20 1505)     LOS: 1 day   Marylu Lund, MD [facility] Pager On Amion  If 7PM-7AM, please contact night-coverage 08/05/2020, 4:09 PM

## 2020-08-05 NOTE — Evaluation (Addendum)
Occupational Therapy Evaluation Patient Details Name: [name] MRN: [id number] DOB: [birth year] Today's Date: 08/05/2020    History of Present Illness 86 y.o. female with PMH including hydronephrosis vesicular fistula, paroxysmal A. fib, CHF, aortic stenosis, HLD, HTN, hypothyroidism, IBS, RA. H/o recurrent UTI presenting once again with dysuria.   Clinical Impression   Pt admitted with the above diagnoses and presents with below problem list. Pt will benefit from continued acute OT to address the below listed deficits and maximize independence with basic ADLs prior to d/c home with family. PTA pt was mod I to min guard with most ADLs, does endorse struggling recently with LB ADLs. She lives with her son who takes care of all IADLs (household management, etc) and is with her whenever she is up and walking. Pt endorses consistently using O2 at night, inconsistently during the day "I feel fine without it." Pt removed [place] at start of session with sats remaining 98-100 throughout session. Did recommend pt try to obtain a portable pulse ox for accurate measurement at home so she can have objective as well as subjective data for making O2 related decisions.      Follow Up Recommendations  No OT follow up;Supervision/Assistance - 24 hour    Equipment Recommendations  None recommended by OT ; would benefit from [facility] aide for assist with LB bathing/dressing     Recommendations for Other Services       Precautions / Restrictions Precautions Precautions: Fall Restrictions Weight Bearing Restrictions: No      Mobility Bed Mobility               General bed mobility comments: up in recliner  Transfers Overall transfer level: Needs assistance Equipment used: None Transfers: Sit to/from Stand Sit to Stand: Min guard         General transfer comment: min guard for safety    Balance Overall balance assessment: Needs assistance Sitting-balance support: No upper extremity  supported;Feet supported Sitting balance-Leahy Scale: Fair     Standing balance support: Single extremity supported;No upper extremity supported Standing balance-Leahy Scale: Fair Standing balance comment: single extremity support for dynamic tasks                           ADL either performed or assessed with clinical judgement   ADL Overall ADL's : Needs assistance/impaired Eating/Feeding: Set up;Sitting   Grooming: Set up;Sitting   Upper Body Bathing: Set up;Sitting   Lower Body Bathing: Minimal assistance;Sit to/from stand   Upper Body Dressing : Set up;Sitting   Lower Body Dressing: Minimal assistance;Sit to/from stand   Toilet Transfer: Min guard;Ambulation;Comfort height toilet   Toileting- Clothing Manipulation and Hygiene: Min guard;Sitting/lateral lean;Sit to/from stand   Tub/ Banker: Min guard   Functional mobility during ADLs: Min guard General ADL Comments: Min guard to walk in the room and complete simulated toilet transfer     Vision         Perception     Praxis      Pertinent Vitals/Pain Pain Assessment: Faces Faces Pain Scale: Hurts a little bit Pain Location: unspecified, mild grimacing with mobility Pain Intervention(s): Monitored during session     Hand Dominance Right   Extremity/Trunk Assessment Upper Extremity Assessment Upper Extremity Assessment: Generalized weakness;Overall [facility] for tasks assessed   Lower Extremity Assessment Lower Extremity Assessment: Defer to PT evaluation   Cervical / Trunk Assessment Cervical / Trunk Assessment: Kyphotic   Communication Communication Communication: No  difficulties   Cognition Arousal/Alertness: Awake/alert Behavior During Therapy: WFL for tasks assessed/performed Overall Cognitive Status: Within Functional Limits for tasks assessed                                     General Comments  Pt endorses using O2 at night. She has not been consistently  using during the day because she feels fine without. Recommended pt obtain pulse ox for accurate monitoring at home. Of note, O2 sats 98-100 on RA throughout session. Nursing notified.    Exercises     Shoulder Instructions      Home Living Family/patient expects to be discharged to:: Private residence Living Arrangements: Children;Other (Comment) (lives with her son) Available Help at Discharge: Family;Available 24 hours/day Type of Home: House Home Access: Level entry     Home Layout: One level     Bathroom Shower/Tub: Door;Walk-in Psychologist, prison and probation services: Handicapped height Bathroom Accessibility: Yes   Home Equipment: Environmental consultant - 2 wheels;Shower seat - built in;Walker - 4 wheels          Prior Functioning/Environment Level of Independence: Needs assistance;Independent with assistive device(s)  Gait / Transfers Assistance Needed: intermittent single extremity support from environment (ie furniture). does not wish to use a walker ADL's / Homemaking Assistance Needed: endorses struggling with LB bathing and dressing recently. Son takes care of IADLS.            OT Problem List: Decreased activity tolerance;Impaired balance (sitting and/or standing);Decreased knowledge of precautions;Decreased knowledge of use of DME or AE      OT Treatment/Interventions: Self-care/ADL training;Energy conservation;DME and/or AE instruction;Therapeutic activities;Patient/family education;Balance training    OT Goals(Current goals can be found in the care plan section) Acute Rehab OT Goals Patient Stated Goal: return home, look into [facility] aide to assist with bathing OT Goal Formulation: With patient Time For Goal Achievement: 08/19/20 Potential to Achieve Goals: Good ADL Goals Pt Will Perform Grooming: with set-up;sitting;standing Pt Will Perform Lower Body Bathing: with min guard assist;with adaptive equipment;sit to/from stand Pt Will Perform Lower Body Dressing: with min guard  assist;sit to/from stand;with adaptive equipment Pt Will Transfer to Toilet: ambulating;with supervision Pt Will Perform Toileting - Clothing Manipulation and hygiene: with supervision;sitting/lateral leans;sit to/from stand  OT Frequency: Min 2X/week   Barriers to D/C:            Co-evaluation              AM-PAC OT "6 Clicks" Daily Activity     Outcome Measure Help from another person eating meals?: None Help from another person taking care of personal grooming?: None Help from another person toileting, which includes using toliet, bedpan, or urinal?: A Little Help from another person bathing (including washing, rinsing, drying)?: A Little Help from another person to put on and taking off regular upper body clothing?: None Help from another person to put on and taking off regular lower body clothing?: A Little 6 Click Score: 21   End of Session Nurse Communication: Other (comment) (see general comments)  Activity Tolerance: Patient tolerated treatment well Patient left: in chair;with call bell/phone within reach  OT Visit Diagnosis: Other abnormalities of gait and mobility (R26.89);Muscle weakness (generalized) (M62.81);Unsteadiness on feet (R26.81)                Time: 2878-6767 OT Time Calculation (min): 27 min Charges:  OT General Charges $OT Visit: 1 Visit  OT Evaluation $OT Eval Low Complexity: 1 Low OT Treatments $Self Care/Home Management : 8-22 mins  Tyrone Schimke, OT Acute Rehabilitation Services Pager: [phone] Office: [phone]   Hortencia Pilar 08/05/2020, 12:34 PM

## 2020-08-05 NOTE — Progress Notes (Signed)
[facility] Community Based Palliative Care  This patient is enrolled in our palliative care services in the community.   [facility] will continue to follow for any discharge planning needs and to coordinate continuation of palliative care.  If you have questions or need assistance, please call [phone] or contact the hospital Liaison listed on AMION.  Thank you for the opportunity to participate in this patient's care.  Sherry Gibson, RN [facility] Hospital Liaison [phone] 

## 2020-08-05 NOTE — Evaluation (Signed)
Physical Therapy Evaluation Patient Details Name: [name] MRN: [id number] DOB: 14-Jan-1927 Today's Date: 08/05/2020   History of Present Illness  86 y.o. female with PMH including hydronephrosis vesicular fistula, paroxysmal A. fib, CHF, aortic stenosis, HLD, HTN, hypothyroidism, IBS, RA. H/o recurrent UTI presenting once again with dysuria. Dx of complicated UTI.  Clinical Impression  Pt admitted with above diagnosis. Pt ambulated 160' holding IV pole, no loss of balance. She is likely near baseline with mobility. She reports she holds on to furniture and the walls when walking at home, she is not open to using a cane nor a RW, though she does have a RW at home. Pt has 24* assist from her son at home.  Pt currently with functional limitations due to the deficits listed below (see PT Problem List). Pt will benefit from skilled PT to increase their independence and safety with mobility to allow discharge to the venue listed below.       Follow Up Recommendations Home health PT;Supervision for mobility/OOB    Equipment Recommendations  None recommended by PT    Recommendations for Other Services       Precautions / Restrictions Precautions Precautions: Fall Precaution Comments: pt denies falls in past 1 year Restrictions Weight Bearing Restrictions: No      Mobility  Bed Mobility               General bed mobility comments: up in recliner  Transfers Overall transfer level: Needs assistance Equipment used: None Transfers: Sit to/from Stand Sit to Stand: Supervision         General transfer comment: supervision for safety  Ambulation/Gait   Gait Distance (Feet): 160 Feet Assistive device: IV Pole Gait Pattern/deviations: Step-through pattern;Decreased stride length Gait velocity: WFL   General Gait Details: steady with IV pole, pt did not want to ambulate without holding IV pole, she refused use of cane/walker  Stairs            Wheelchair  Mobility    Modified Rankin (Stroke Patients Only)       Balance Overall balance assessment: Needs assistance Sitting-balance support: No upper extremity supported;Feet supported Sitting balance-Leahy Scale: Fair     Standing balance support: Single extremity supported;No upper extremity supported Standing balance-Leahy Scale: Fair Standing balance comment: single extremity support for dynamic tasks                             Pertinent Vitals/Pain Pain Assessment: 0-10 Pain Score: 8  Faces Pain Scale: Hurts a little bit Pain Location: bladder area Pain Descriptors / Indicators: Sore ("raw") Pain Intervention(s): Limited activity within patient's tolerance;Monitored during session    Home Living Family/patient expects to be discharged to:: Private residence Living Arrangements: Children (lives with her son) Available Help at Discharge: Family;Available 24 hours/day Type of Home: House Home Access: Level entry     Home Layout: One level Home Equipment: Walker - 2 wheels;Shower seat - built in;Walker - 4 wheels      Prior Function Level of Independence: Needs assistance;Independent with assistive device(s)   Gait / Transfers Assistance Needed: intermittent single extremity support from environment (ie furniture). does not wish to use a walker or a cane. Reports she does use a RW when walking laps on her deck, but uses walls/furniture inside.  ADL's / Homemaking Assistance Needed: endorses struggling with LB bathing and dressing recently. Son takes care of IADLS.  Comments: no longer drives  Hand Dominance   Dominant Hand: Right    Extremity/Trunk Assessment   Upper Extremity Assessment Upper Extremity Assessment: Defer to OT evaluation    Lower Extremity Assessment Lower Extremity Assessment: Overall WFL for tasks assessed    Cervical / Trunk Assessment Cervical / Trunk Assessment: Kyphotic  Communication   Communication: No difficulties   Cognition Arousal/Alertness: Awake/alert Behavior During Therapy: WFL for tasks assessed/performed Overall Cognitive Status: Within Functional Limits for tasks assessed                                        General Comments General comments (skin integrity, edema, etc.): Pt endorses using O2 at night. She has not been consistently using during the day because she feels fine without. Recommended pt obtain pulse ox for accurate monitoring at home. Of note, O2 sats 98-100 on RA throughout session. Nursing notified.    Exercises     Assessment/Plan    PT Assessment Patient needs continued PT services  PT Problem List Decreased activity tolerance;Decreased balance;Decreased mobility       PT Treatment Interventions Gait training;Therapeutic exercise;Therapeutic activities;Patient/family education    PT Goals (Current goals can be found in the Care Plan section)  Acute Rehab PT Goals Patient Stated Goal: return home, look into [facility] aide to assist with bathing PT Goal Formulation: With patient Time For Goal Achievement: 08/19/20 Potential to Achieve Goals: Good    Frequency Min 3X/week   Barriers to discharge        Co-evaluation               AM-PAC PT "6 Clicks" Mobility  Outcome Measure Help needed turning from your back to your side while in a flat bed without using bedrails?: None Help needed moving from lying on your back to sitting on the side of a flat bed without using bedrails?: None Help needed moving to and from a bed to a chair (including a wheelchair)?: None Help needed standing up from a chair using your arms (e.g., wheelchair or bedside chair)?: None Help needed to walk in hospital room?: A Little Help needed climbing 3-5 steps with a railing? : A Little 6 Click Score: 22    End of Session Equipment Utilized During Treatment: Gait belt Activity Tolerance: Patient tolerated treatment well Patient left: in chair;with call bell/phone  within reach Nurse Communication: Mobility status PT Visit Diagnosis: Difficulty in walking, not elsewhere classified (R26.2);Pain    Time: 6553-7482 PT Time Calculation (min) (ACUTE ONLY): 8 min   Charges:   PT Evaluation $PT Eval Low Complexity: 1 Low          Blondell Reveal Kistler PT 08/05/2020  Acute Rehabilitation Services Pager [phone] Office [phone]

## 2020-08-05 NOTE — Plan of Care (Signed)
Pt reports pain 6/10 in lower abdomen.  No further complaints at this time.   Problem: Education: Goal: Knowledge of General Education information will improve Description: Including pain rating scale, medication(s)/side effects and non-pharmacologic comfort measures Outcome: Progressing   Problem: Health Behavior/Discharge Planning: Goal: Ability to manage health-related needs will improve Outcome: Progressing   Problem: Clinical Measurements: Goal: Ability to maintain clinical measurements within normal limits will improve Outcome: Progressing Goal: Will remain free from infection Outcome: Progressing Goal: Diagnostic test results will improve Outcome: Progressing Goal: Respiratory complications will improve Outcome: Progressing Goal: Cardiovascular complication will be avoided Outcome: Progressing   Problem: Activity: Goal: Risk for activity intolerance will decrease Outcome: Progressing   Problem: Nutrition: Goal: Adequate nutrition will be maintained Outcome: Progressing   Problem: Coping: Goal: Level of anxiety will decrease Outcome: Progressing   Problem: Elimination: Goal: Will not experience complications related to bowel motility Outcome: Progressing Goal: Will not experience complications related to urinary retention Outcome: Progressing   Problem: Safety: Goal: Ability to remain free from injury will improve Outcome: Progressing   Problem: Skin Integrity: Goal: Risk for impaired skin integrity will decrease Outcome: Progressing   Problem: Pain Managment: Goal: General experience of comfort will improve Outcome: Not Progressing

## 2020-08-06 LAB — URINE CULTURE
Culture: >=100000 — AB
Special Requests: NORMAL

## 2020-08-06 LAB — BASIC METABOLIC PANEL
Anion gap: 4 — ABNORMAL LOW (ref 5–15)
BUN: 20 mg/dL (ref 8–23)
CO2: 23 mmol/L (ref 22–32)
Calcium: 8.5 mg/dL — ABNORMAL LOW (ref 8.9–10.3)
Chloride: 111 mmol/L (ref 98–111)
Creatinine, Ser: 1.15 mg/dL — ABNORMAL HIGH (ref 0.44–1.00)
GFR calc non Af Amer: 42 mL/min — ABNORMAL LOW (ref >60)
Glucose, Bld: 124 mg/dL — ABNORMAL HIGH (ref 70–99)
Potassium: 4.4 mmol/L (ref 3.5–5.1)
Sodium: 138 mmol/L (ref 135–145)

## 2020-08-06 LAB — CBC
HCT: 27.3 % — ABNORMAL LOW (ref 36.0–46.0)
Hemoglobin: 8 g/dL — ABNORMAL LOW (ref 12.0–15.0)
MCH: 24.2 pg — ABNORMAL LOW (ref 26.0–34.0)
MCHC: 29.3 g/dL — ABNORMAL LOW (ref 30.0–36.0)
MCV: 82.7 fL (ref 80.0–100.0)
Platelets: 262 10*3/uL (ref 150–400)
RBC: 3.3 MIL/uL — ABNORMAL LOW (ref 3.87–5.11)
RDW: 20.7 % — ABNORMAL HIGH (ref 11.5–15.5)
WBC: 8.1 10*3/uL (ref 4.0–10.5)
nRBC: 0 % (ref 0.0–0.2)

## 2020-08-06 LAB — GLUCOSE, CAPILLARY: Glucose-Capillary: 95 mg/dL (ref 70–99)

## 2020-08-06 MED ORDER — CEPHALEXIN 500 MG PO CAPS
500.0000 mg | ORAL_CAPSULE | Freq: Two times a day (BID) | ORAL | Status: DC
  Administered 2020-08-06 – 2020-08-07 (×3): 500 mg via ORAL
  Filled 2020-08-06 (×3): qty 1

## 2020-08-06 NOTE — Progress Notes (Signed)
SATURATION QUALIFICATIONS: (This note is used to comply with regulatory documentation for home oxygen)  Patient Saturations on Room Air at Rest = 100%  Patient Saturations on Room Air while Ambulating = 95%  Patient Saturations on 0 Liters of oxygen while Ambulating = 95%  Please briefly explain why patient needs home oxygen: N/A 

## 2020-08-06 NOTE — TOC Transition Note (Signed)
Transition of Care [facility]) - CM/SW Discharge Note   Patient Details  Name: [name] MRN: [id number] Date of Birth: 07/21/1927  [facility] of [facility] CM/SW Contact:  Lennart Pall, LCSW Phone Number: 08/06/2020, 3:52 PM   Clinical Narrative:    Alerted that dc anticipated for pt tomorrow.  Orders in for HHPT and O2.  Spoke with pt and son who report pt already has home oxygen.  They are agreeable to HHPT and referral placed with [facility].  No further TOC needs noted.   Final next level of care: [place] Barriers to Discharge: Continued Medical Work up   Patient Goals and CMS Choice Patient states their goals for this hospitalization and ongoing recovery are:: return home CMS Medicare.gov Compare Post Acute Care list provided to:: Patient Choice offered to / list presented to : Patient  Discharge Placement                       Discharge Plan and Services                DME Arranged: N/A DME Agency: NA       HH Arranged: PT HH Agency: [place] Date [place]: 08/06/20 Time [place]: [id number] Representative spoke with at [place]: [place] Determinants of Health ([place]) Interventions     Readmission Risk Interventions Readmission Risk Prevention Plan 02/16/2020 09/03/2019  Transportation Screening - Complete  PCP or Specialist Appt within 3-5 Days - Complete  HRI or [place] - Complete  Social Work Consult for [place] Planning/Counseling - Complete  Palliative Care Screening - Not Applicable  Medication Review Press photographer) - Complete  PCP or Specialist appointment within 3-5 days of discharge Not Complete -  PCP/Specialist Appt Not Complete comments patient will discharge to SNF -  [place] or Home Care Consult Complete -  SW Recovery Care/Counseling Consult Complete -  Palliative Care Screening Not Applicable -  [place] Complete -  Some  recent data might be hidden

## 2020-08-06 NOTE — Care Management Important Message (Signed)
Important Message  Patient Details IM Letter given to the Patient Name: [name] MRN: [id number] Date of Birth: 04-Feb-1927   Medicare Important Message Given:  Yes     Kerin Salen 08/06/2020, 10:46 AM

## 2020-08-06 NOTE — Progress Notes (Signed)
Nutrition Note  RD consulted for diet education regarding regulating bowel movements. Pt with history of hydronephrosis vesicular fistula and diverticulitis.  Reviewed foods to avoid to help with frequent stools. Pt states whenever she eats she instantly has a bowel movement. Reviewed fiber containing foods and how to maintain bowel movements while also bulking up the stool. Encouraged daily activity and adequate fluid intakes.  Handout in discharge instructions.  Patient on a regular diet.  PO intakes: 50-75%.   If nutrition issues arise, please consult RD.   Clayton Bibles, MS, RD, LDN Inpatient Clinical Dietitian Contact information available via Amion

## 2020-08-06 NOTE — Progress Notes (Signed)
PROGRESS NOTE    [name]  ZDG:[id number] DOB: 1927/09/30 DOA: 08/04/2020 PCP: [name], NP    Brief Narrative:  86 y.o. female with medical history significant of with extensive past medical history including hydronephrosis vesicular fistula recurrent UTI presenting once again with dysuria. (Past medical history significant for paroxysmal A. fib, CHF, aortic stenosis, HLD, HTN, hypothyroidism, IBS, RA... Chronically O2 dependent 2 L at home) Currently lives independently with family at home.   Patient Denies having: Fever, Chills, Cough, SOB, Chest Pain, Abd pain, N/V/D, headache, dizziness, lightheadedness,  Dysuria, Joint pain, rash, open wounds  ED Course:   Hemodynamically stable, afebrile temp 98.3, CBC WBC 9.4, hemoglobin 8.2, hematocrit 27.1 CMP within normal limits exception of BUN 29, creatinine 1.22  UA positive for leukocyte Estrace > 50 WBCs Cultures were obtained -Based on previous culture of ESBL meropenem was initiated  Assessment & Plan:   Principal Problem:   Complicated UTI (urinary tract infection) Active Problems:   Aortic stenosis   Hypertension   Hyperlipidemia   Persistent atrial fibrillation (HCC)   Degenerative joint disease involving multiple joints   Hydronephrosis   Iron deficiency   Chronic combined systolic and diastolic congestive heart failure (HCC)   Principal Problem:   Complicated UTI (urinary tract infection) -with significant dysuria .  Chronic hydronephrosis -CT urogram with chronic changes-  chronic diverticulitis with apparent colovesical fistula noted -Urologist consulted, appreciate input. Recommendation to treat UTI and to tailor abx accordingly based on culture results -Urine culture pos for Klebsiella oxytoca resistant to ampicillin -Patient was empirically started on meropenem at time of presentation. Given urine culture results. Have transitioned to keflex -Given pt's complicated anatomy and high risk  for resistant organism, would monitor overnight on oral keflex to confirm pt will tolerated regimen. Pt has hx of early re-admission for recurrent UTI  Active Problems:   Aortic stenosis -stable, systolic murmur on exam. Most recent 2d echo from 2020 with AV not assessed on that study, was earlier reviewed    Hypertension -stable continue home medication Lasix and Toprol as tolerated. BP controlled at this time    Hyperlipidemia -not on any statins    P- atrial fibrillation ([place]) -not chronically anticoagulated, continue on beta-blockers as tolerated. Currently rate controlled    Degenerative joint disease involving multiple joints  Hypothyroidism -continue Synthroid per home regimen    Iron deficiency - Iron level noted to be within normal limits at 44 with normal ferritin    Chronic combined systolic and diastolic congestive heart failure (HCC) -most recent echo in 2020 reviewed with EF of 35-40%. Currently clinically euvolemic  DVT prophylaxis: Heparin sub q Code Status: Full Family Communication: Pt in room, family not at bedside  Status is: Inpatient  Remains inpatient appropriate because:IV treatments appropriate due to intensity of illness or inability to take PO   Dispo: The patient is from: Home              Anticipated d/c is to: Home              Anticipated d/c date is: 1 day              Patient currently is not medically stable to d/c.   Consultants:   Urology  Procedures:     Antimicrobials: Anti-infectives (From admission, onward)   Start     Dose/Rate Route Frequency Ordered Stop   08/06/20 1000  cephALEXin (KEFLEX) capsule 500 mg  500 mg Oral Every 12 hours 08/06/20 0933     08/05/20 0200  meropenem (MERREM) 500 mg in sodium chloride 0.9 % 100 mL IVPB  Status:  Discontinued        500 mg 200 mL/hr over 30 Minutes Intravenous Every 12 hours 08/04/20 1727 08/06/20 0933   08/04/20 1415  meropenem (MERREM) 500 mg in sodium chloride 0.9 % 100 mL  IVPB        500 mg 200 mL/hr over 30 Minutes Intravenous  Once 08/04/20 1414 08/04/20 1527      Subjective: Feeling better. Still with abd discomfort described as 6/10 pain with "burning"  Objective: Vitals:   08/05/20 1336 08/05/20 2024 08/06/20 0537 08/06/20 1341  BP: 134/70 100/61 138/67 129/71  Pulse: 91 92 75 76  Resp: 16 16 16 16   Temp: 98.1 F (36.7 C) 98.4 F (36.9 C) 97.9 F (36.6 C) 98.4 F (36.9 C)  TempSrc: Oral Oral Oral Oral  SpO2:  100% 100% 100%  Weight:      Height:        Intake/Output Summary (Last 24 hours) at 08/06/2020 1559 Last data filed at 08/06/2020 4098 Gross per 24 hour  Intake 200 ml  Output --  Net 200 ml   Filed Weights   08/04/20 1109  Weight: 51.3 kg    Examination: General exam: Conversant, in no acute distress Respiratory system: normal chest rise, clear, no audible wheezing Cardiovascular system: regular rhythm, s1-s2 Gastrointestinal system: Nondistended, nontender, pos BS Central nervous system: No seizures, no tremors Extremities: No cyanosis, no joint deformities Skin: No rashes, no pallor Psychiatry: Affect normal // no auditory hallucinations   Data Reviewed: I have personally reviewed following labs and imaging studies  CBC: Recent Labs  Lab 08/04/20 1141 08/04/20 1458 08/05/20 0522 08/06/20 0517  WBC 10.6* 9.4 8.3 8.1  NEUTROABS 7.8*  --   --   --   HGB 8.8* 8.2* 7.8* 8.0*  HCT 29.7* 27.1* 25.9* 27.3*  MCV 81.8 80.9 82.2 82.7  PLT 328 316 261 119   Basic Metabolic Panel: Recent Labs  Lab 08/04/20 1141 08/04/20 1458 08/05/20 0522 08/06/20 0517  NA 138  --  138 138  K 4.3  --  4.5 4.4  CL 104  --  110 111  CO2 22  --  22 23  GLUCOSE 113*  --  104* 124*  BUN 29*  --  27* 20  CREATININE 1.22*  --  1.37* 1.15*  CALCIUM 9.4  --  8.6* 8.5*  MG  --  1.7  --   --   PHOS  --  3.7  --   --    GFR: Estimated Creatinine Clearance: 21.7 mL/min (A) (by C-G formula based on SCr of 1.15 mg/dL (H)). Liver  Function Tests: No results for input(s): AST, ALT, ALKPHOS, BILITOT, PROT, ALBUMIN in the last 168 hours. No results for input(s): LIPASE, AMYLASE in the last 168 hours. No results for input(s): AMMONIA in the last 168 hours. Coagulation Profile: Recent Labs  Lab 08/05/20 0522  INR 1.1   Cardiac Enzymes: No results for input(s): CKTOTAL, CKMB, CKMBINDEX, TROPONINI in the last 168 hours. BNP (last 3 results) No results for input(s): PROBNP in the last 8760 hours. HbA1C: Recent Labs    08/04/20 1557  HGBA1C 5.7*   CBG: Recent Labs  Lab 08/05/20 0631 08/05/20 0742 08/06/20 0731  GLUCAP 129* 89 95   Lipid Profile: No results for input(s): CHOL, HDL, LDLCALC, TRIG, CHOLHDL,  LDLDIRECT in the last 72 hours. Thyroid Function Tests: Recent Labs    08/04/20 1458  TSH 0.787   Anemia Panel: Recent Labs    08/04/20 1458  TIBC 376  IRON 44   Sepsis Labs: Recent Labs  Lab 08/04/20 1141  LATICACIDVEN 0.8    Recent Results (from the past 240 hour(s))  Urine culture     Status: Abnormal   Collection Time: 08/04/20 11:19 AM   Specimen: Urine, Catheterized  Result Value Ref Range Status   Specimen Description   Final    URINE, CATHETERIZED Performed at [facility] [address], [place], [place] [address]    Special Requests   Final    Normal Performed at [facility], [place] [address] [place], [place] [address]    Culture >=100,000 COLONIES/mL KLEBSIELLA OXYTOCA (A)  Final   Report Status 08/06/2020 FINAL  Final   Organism ID, Bacteria KLEBSIELLA OXYTOCA (A)  Final      Susceptibility   Klebsiella oxytoca - MIC*    AMPICILLIN >=32 RESISTANT Resistant     CEFAZOLIN 8 SENSITIVE Sensitive     CEFTRIAXONE <=0.25 SENSITIVE Sensitive     CIPROFLOXACIN <=0.25 SENSITIVE Sensitive     GENTAMICIN <=1 SENSITIVE Sensitive     IMIPENEM <=0.25 SENSITIVE Sensitive     NITROFURANTOIN 32 SENSITIVE Sensitive     TRIMETH/SULFA <=20  SENSITIVE Sensitive     AMPICILLIN/SULBACTAM 8 SENSITIVE Sensitive     PIP/TAZO <=4 SENSITIVE Sensitive     * >=100,000 COLONIES/mL KLEBSIELLA OXYTOCA  Respiratory Panel by RT PCR (Flu A&B, Covid) - Nasopharyngeal Swab     Status: None   Collection Time: 08/04/20  2:58 PM   Specimen: Nasopharyngeal Swab  Result Value Ref Range Status   SARS Coronavirus 2 by RT PCR NEGATIVE NEGATIVE Final    Comment: (NOTE) SARS-CoV-2 target nucleic acids are NOT DETECTED.  The SARS-CoV-2 RNA is generally detectable in upper respiratoy specimens during the acute phase of infection. The lowest concentration of SARS-CoV-2 viral copies this assay can detect is 131 copies/mL. A negative result does not preclude SARS-Cov-2 infection and should not be used as the sole basis for treatment or other patient management decisions. A negative result may occur with  improper specimen collection/handling, submission of specimen other than nasopharyngeal swab, presence of viral mutation(s) within the areas targeted by this assay, and inadequate number of viral copies (<131 copies/mL). A negative result must be combined with clinical observations, patient history, and epidemiological information. The expected result is Negative.  Fact Sheet for Patients:  PinkCheek.be  Fact Sheet for Healthcare Providers:  GravelBags.it  This test is no t yet approved or cleared by the [place] FDA and  has been authorized for detection and/or diagnosis of SARS-CoV-2 by FDA under an Emergency Use Authorization (EUA). This EUA will remain  in effect (meaning this test can be used) for the duration of the COVID-19 declaration under Section 564(b)(1) of the Act, 21 U.S.C. section 360bbb-3(b)(1), unless the authorization is terminated or revoked sooner.     Influenza A by PCR NEGATIVE NEGATIVE Final   Influenza B by PCR NEGATIVE NEGATIVE Final    Comment: (NOTE) The  Xpert Xpress SARS-CoV-2/FLU/RSV assay is intended as an aid in  the diagnosis of influenza from Nasopharyngeal swab specimens and  should not be used as a sole basis for treatment. Nasal washings and  aspirates are unacceptable for Xpert Xpress SARS-CoV-2/FLU/RSV  testing.  Fact Sheet for Patients: PinkCheek.be  Fact Sheet for Healthcare Providers: GravelBags.it  This test is not yet approved or cleared by the [place] FDA and  has been authorized for detection and/or diagnosis of SARS-CoV-2 by  FDA under an Emergency Use Authorization (EUA). This EUA will remain  in effect (meaning this test can be used) for the duration of the  Covid-19 declaration under Section 564(b)(1) of the Act, 21  U.S.C. section 360bbb-3(b)(1), unless the authorization is  terminated or revoked. Performed at [facility], [place] [address], [place], [place] [address]      Radiology Studies: No results found.  Scheduled Meds: . aspirin  81 mg Oral Daily  . cephALEXin  500 mg Oral Q12H  . docusate sodium  100 mg Oral BID  . furosemide  20 mg Oral Daily  . gabapentin  100 mg Oral TID  . heparin injection (subcutaneous)  5,000 Units Subcutaneous Q8H  . levothyroxine  75 mcg Oral Q0600  . metoprolol succinate  75 mg Oral Daily  . multivitamin with minerals  1 tablet Oral Daily  . olopatadine  1 drop Both Eyes BID  . pentosan polysulfate  100 mg Oral TID  . sodium chloride flush  3 mL Intravenous Q12H   Continuous Infusions:    LOS: 2 days   Marylu Lund, MD [facility] Pager On Amion  If 7PM-7AM, please contact night-coverage 08/06/2020, 3:59 PM

## 2020-08-06 NOTE — Discharge Instructions (Signed)
Low Fiber Nutrition Therapy   You may need a low-fiber diet if you have Crohn's disease, diverticulitis, gastroparesis, ulcerative colitis, a new colostomy, or new ileostomy. A low-fiber diet may also be needed following radiation therapy to the pelvis and lower bowel or recent intestinal surgery.  A low-fiber diet reduces the frequency and volume of your stools. This lessens irritation to the gastrointestinal (GI) tract and can help you heal. Use this diet if you have a stricture so your intestine doesn't get blocked. The goal of this diet is to get less than 13 grams of fiber daily. It's also important to eat enough protein foods while you are on a low-fiber diet.  Drink nutrition supplements that have 1 gram of fiber or less in each serving. If your stricture is severe or if your inflammation is severe, drink more liquids to reduce symptoms and to get enough calories and protein.  Tips  Eat about 5 to 6 small meals daily or about every 3 to 4 hours. Do not skip meals.   Every time you eat, include a small amount of protein (1 to 2 ounces) plus an additional food. Low fiber starch foods are the best choice to eat with protein.   Limit acidic, spicy and high-fat or fried and greasy foods to reduce GI symptoms.   Use a psyllium supplement such as Metamucil to help regulate bowel movements  Do not eat raw fruits and vegetables while on this diet. All fruits and vegetables need to be cooked and without peels or skins.   Drink a lot of fluids, at least 8 cups of fluid each day. Limit drinks with caffeine, sugar, and sugar substitutes.   Plain water is the best choice. Avoid mixing drink packets or flavor drops into water. .   Take a chewable multivitamin with minerals. Gummy vitamins do not have enough minerals and can block an ostomy and non-chewable supplements are not easily digested. Chewable supplements must be used if you have a stricture or ostomy.   If you are lactose intolerant, you  may need to eat low-lactose dairy products. If you can't tolerate dairy, ask your RDN about how you can get enough calcium from other foods.   Do not take a calcium supplement. They can cause a blockage.   It is important to add high-calcium foods gradually to your diet and monitor for symptoms to avoid a blockage.   Do not add more fiber to your diet until your health care provider or registered dietitian nutritionist (RDN) tells you it's OK. Fiber is part of whole grains, fruits and vegetables (foods from plants) and needs to be slowly added back in to your diet when your body is healed.   Choose foods that have been safely handled and prepared to lower your risk of foodborne illness. Talk to your RDN or see the Food Safety Nutrition Therapy handout for more information.   Foods Recommended These foods are low in fat and fiber and will help with your GI symptoms. Food Group Foods Recommended  Grains  Choose grain foods with less than 2 grams of fiber per serving. Refined white flour products--for example, enriched white bread without seeds, crackers or pasta Cream of wheat or rice Grits (fine ground) Tortillas: white flour or corn White rice, well-cooked (do not rinse, or soak before cooking) Cold and hot cereals made from white or refined flour such as puffed rice or corn flakes  Protein Foods  Lean, very tender, well-cooked poultry or fish; red  meats: beef, pork or lamb (slow cook until soft; chop meats if you have stricture or ostomy) Eggs, well-cooked Smooth nut butters such as almond, peanut, or sunflower Tofu  Dairy  If you have lactose intolerance, drinking milk products from cows or goats may make diarrhea worse. Foods marked with an asterisk (*) have lactose. Milk: fat-free, 1% or 2% * (choose best tolerated) Lactose-free milk Buttermilk* Fortified non-dairy milks: almond, cashew, coconut, or rice (be aware that these options are not good sources of protein so you will need to  eat an additional protein food) Kefir Yogurt*/lactose-free yogurt with active cultures (without nuts, fruit, granola or chocolate) Mild cheese* (hard and aged cheeses tend to be lower in lactose such as cheddar, swiss or parmesan) Cottage cheese* or lactose-free cottage cheese Low-fat ice cream* or lactose-free ice cream Sherbet* (usually lower lactose)  Vegetables  Canned and well-cooked vegetables without seeds, skins, or hulls  Carrots or green beans, cooked White, red or yellow potatoes without skins Strained vegetable juice  Fruit Soft, and well-cooked fruits without skins, seeds, or membranes Canned fruit in juice: peaches, pears, or applesauce Fruit juice without pulp diluted by half with water may be tolerated better Fruit drinks fortified with vitamin C may be tolerated better than 100% fruit juice  Oils  When possible, choose healthy oils and fats, such as olive and canola oils, plant oils rather than solid fats.  Other  Broth and strained soups made from allowed foods Desserts (small portions) without whole grains, seeds, nuts, raisins, or coconut Jelly (clear)   Foods Not Recommended These foods are higher in fat and fiber and may make your GI symptoms worse.  Food Group Foods Not Recommended  Grains  Bread, whole wheat or with whole grain flour or seeds or nuts Brown rice, quinoa, kasha, barley Tortillas: whole grain Whole wheat pasta Whole grain and high-fiber cereals, including oatmeal, bran flakes or shredded wheat Popcorn  Protein Foods  Steak, pork chops, or other meats that are fatty or have gristle Fried meat, poultry, or fish Seafood with a tough or rubbery texture, such as shrimp Luncheon meats such as bologna and salami Sausage, bacon, or hot dogs Dried beans, peas, or lentils Hummus Sushi Nuts and chunky nut butters  Dairy  Whole milk Pea milk and soymilk (may cause diarrhea, gas, bloating, and abdominal pain) Cream Half-and-half Sour cream Yogurt  with added fruit, nuts, or granola or chocolate  Vegetables  Alfalfa or bean sprouts (high fiber and risk for bacteria) Raw or undercooked vegetables: beets; broccoli; brussels sprouts; cabbage; cauliflower; collard, mustard, or turnip greens; corn; cucumber; green peas or any kind of peas; kale; lima beans; mushrooms; okra; olives; pickles and relish; onions; parsnips; peppers; potato skins; sauerkraut; spinach; tomatoes  Fruit Raw fruit Dried fruit Avocado, berries, coconut Canned fruit in syrup Canned fruit with mandarin oranges, papaya or pineapple Fruit juice with pulp Prune juice Fruit skin  Oils  Pork rinds   Low-Fiber (8 grams) Sample 1-Day Menu  Breakfast  cup cream of wheat (0.5 gram fiber)  1 slice white toast (1 gram fiber)  1 teaspoon margarine, soft tub  2 scrambled eggs   Morning Snack 1 cup lactose-free nutrition supplement  Lunch 2 slices white bread (2 grams fiber)  3 tablespoons tuna  1 tablespoon mayonnaise  1 cup chicken noodle soup (1 gram fiber)   cup apple juice   Afternoon Snack 6 saltine crackers (0.5 gram fiber)  2 ounces low-fat cheddar cheese  Evening Meal 3  ounces tender chicken breast  1 cup white rice (0.5 gram fiber)   cup cooked canned green beans (2 grams fiber)   cup cranberry juice   Evening Snack 1 cup lactose-free nutrition supplement   Foods That May Help Thicken Stool  Applesauce  Bananas  Barley (when OK to have fiber)  Cheese  Marshmallows  Oatmeal (soluble fiber)  Pasta (white, sauces may increase symptoms)  Peanut butter, creamy  Potatoes, no skin  Pretzels  Saltines  Tapioca  White bread   White rice, boiled  Yogurt  Copyright 2020  [employer]    Copyright 2020  [employer]

## 2020-08-07 LAB — CBC
HCT: 26.6 % — ABNORMAL LOW (ref 36.0–46.0)
Hemoglobin: 7.9 g/dL — ABNORMAL LOW (ref 12.0–15.0)
MCH: 24.6 pg — ABNORMAL LOW (ref 26.0–34.0)
MCHC: 29.7 g/dL — ABNORMAL LOW (ref 30.0–36.0)
MCV: 82.9 fL (ref 80.0–100.0)
Platelets: 279 10*3/uL (ref 150–400)
RBC: 3.21 MIL/uL — ABNORMAL LOW (ref 3.87–5.11)
RDW: 20.7 % — ABNORMAL HIGH (ref 11.5–15.5)
WBC: 9.2 10*3/uL (ref 4.0–10.5)
nRBC: 0 % (ref 0.0–0.2)

## 2020-08-07 LAB — BASIC METABOLIC PANEL
Anion gap: 10 (ref 5–15)
BUN: 18 mg/dL (ref 8–23)
CO2: 22 mmol/L (ref 22–32)
Calcium: 8.8 mg/dL — ABNORMAL LOW (ref 8.9–10.3)
Chloride: 107 mmol/L (ref 98–111)
Creatinine, Ser: 1.05 mg/dL — ABNORMAL HIGH (ref 0.44–1.00)
GFR, Estimated: 46 mL/min — ABNORMAL LOW (ref >60)
Glucose, Bld: 91 mg/dL (ref 70–99)
Potassium: 4.3 mmol/L (ref 3.5–5.1)
Sodium: 139 mmol/L (ref 135–145)

## 2020-08-07 LAB — GLUCOSE, CAPILLARY: Glucose-Capillary: 109 mg/dL — ABNORMAL HIGH (ref 70–99)

## 2020-08-07 MED ORDER — CEPHALEXIN 500 MG PO CAPS: 500.0000 mg | ORAL_CAPSULE | Freq: Two times a day (BID) | ORAL | 0 refills | Status: AC

## 2020-08-07 NOTE — Progress Notes (Signed)
Pt discharged home with all belongings. No questions or concerns at time of discharge. Encouraged pt to reach out to PCP if any concerns arise.

## 2020-08-07 NOTE — Discharge Summary (Signed)
Physician Discharge Summary  [name] DVV:[id number] DOB: 1926-11-06 DOA: 08/04/2020  PCP: [name], NP  Admit date: 08/04/2020 Discharge date: 08/07/2020  Admitted From: Home Disposition:  home  Recommendations for Outpatient Follow-up:  1. Follow up with PCP in 1-2 weeks 2. Follow up with Urology at next earliest appointment 3. Follow up with outpatient Palliative Care services  Home Health:PT   Discharge Condition:Improved CODE STATUS:Full Diet recommendation: Regular   Brief/Interim Summary: 86 y.o.femalewith medical history significant ofwith extensive past medical history including hydronephrosis vesicular fistula recurrent UTI presenting once again with dysuria. (Past medical history significant for paroxysmal A. fib, CHF, aortic stenosis, HLD, HTN, hypothyroidism, IBS,RA.Marland KitchenMarland KitchenChronically O2 dependent 2 L at home) Currently lives independently with family at home.  Patient Denies having: Fever, Chills, Cough, SOB, Chest Pain, Abd pain, N/V/D, headache, dizziness, lightheadedness, Dysuria, Joint pain, rash, open wounds  Discharge Diagnoses:  Principal Problem:   Complicated UTI (urinary tract infection) Active Problems:   Aortic stenosis   Hypertension   Hyperlipidemia   Persistent atrial fibrillation (HCC)   Degenerative joint disease involving multiple joints   Hydronephrosis   Iron deficiency   Chronic combined systolic and diastolic congestive heart failure (HCC)  Principal Problem: Complicated UTI (urinary tract infection) -with significant dysuria.Chronic hydronephrosis -CT urogram with chronic changes-chronic diverticulitis with apparent colovesical fistulanoted -Urologist consulted, appreciate input. Recommendation to treat UTI and to tailor abx accordingly based on culture results -Urine culture pos for Klebsiella oxytoca resistant to ampicillin -Patient was empirically started on meropenem at time of presentation. Given  urine culture results, transitioned to keflex. Will prescribe 7 more days of abx to complete 10 days of treatment -Given pt's complicated anatomy and high risk for resistant organism, patient is certainly expected to have recurrent symptomatic UTI in the near future despite appropriately treating current Klebsiella uti. Patient and family is aware of this. -Have recommended patient to follow up closely with Urology -Outpatient Palliative Care is following  Active Problems: Aortic stenosis -stable, systolic murmur on exam. Most recent 2d echo from 2020 with AV not assessed on that study, was earlier reviewed  Hypertension -stable continue home medication Lasix and Toprol as tolerated. BP controlled at this time  Hyperlipidemia -not on any statins  P-atrial fibrillation (HCC) -not chronically anticoagulated, continue on beta-blockers as tolerated. Currently rate controlled  Degenerative joint disease involving multiple joints  Hypothyroidism-continue Synthroid per home regimen  Iron deficiency - Iron level noted to be within normal limits at 44 with normal ferritin  Chronic combined systolic and diastolic congestive heart failure (HCC) -most recent echo in 2020 reviewed with EF of 35-40%. Currently clinically euvolemic  Discharge Instructions   Allergies as of 08/07/2020      Reactions   Infliximab Other (See Comments)   REACTION: "Enlarged intestines and hernia. Also pushed intestines on lower part of lungs." REACTION: "Enlarged intestines and hernia. Also pushed intestines on lower part of lungs."   Aspirin Nausea And Vomiting   Low Dose is ok Stomach problems   Carvedilol Other (See Comments)   Fatigue/ ill Fatigue/ ill   Codeine Nausea And Vomiting   "Deathly Sick"   Contrast Media [iodinated Diagnostic Agents] Other (See Comments)   All over body pain   Penicillins Rash   Has patient had a PCN reaction causing immediate rash, facial/tongue/throat  swelling, SOB or lightheadedness with hypotension: Y Has patient had a PCN reaction causing severe rash involving mucus membranes or skin necrosis: Y Has patient had a PCN reaction that  required hospitalization: N Has patient had a PCN reaction occurring within the last 10 years: N If all of the above answers are "NO", then may proceed with Cephalosporin use.      Medication List    TAKE these medications   acetaminophen 500 MG tablet Commonly known as: TYLENOL Take 1,000 mg by mouth every 8 (eight) hours as needed for moderate pain (joint pain).   ALPRAZolam 0.25 MG tablet Commonly known as: XANAX Take 1 tablet (0.25 mg total) by mouth at bedtime as needed for anxiety. FOR ANXIETY/RESTLESS LEGS What changed: additional instructions   aspirin 81 MG chewable tablet Chew 1 tablet (81 mg total) by mouth daily.   cephALEXin 500 MG capsule Commonly known as: KEFLEX Take 1 capsule (500 mg total) by mouth every 12 (twelve) hours for 7 days.   fosfomycin 3 g Pack Commonly known as: MONUROL Take 3 g by mouth once. Repeat in 10 days if needed.   furosemide 20 MG tablet Commonly known as: LASIX Take 1 tablet (20 mg total) by mouth daily as needed. What changed: reasons to take this   gabapentin 100 MG capsule Commonly known as: NEURONTIN Take 100 mg by mouth daily.   HYDROcodone-acetaminophen 5-325 MG tablet Commonly known as: NORCO/VICODIN Take 1 tablet by mouth every 6 (six) hours as needed for moderate pain.   levothyroxine 75 MCG tablet Commonly known as: SYNTHROID TAKE 1 TABLET BY MOUTH ONCE DAILY BEFORE BREAKFAST What changed:   how much to take  how to take this  when to take this  additional instructions   metoprolol succinate 25 MG 24 hr tablet Commonly known as: TOPROL-XL Take 75 mg by mouth daily.   multivitamin with minerals Tabs tablet Take 1 tablet by mouth daily.   Olopatadine HCl 0.2 % Soln Place 1 drop into both eyes daily. What changed: when to  take this   phenazopyridine 100 MG tablet Commonly known as: PYRIDIUM Take 1 tablet (100 mg total) by mouth 3 (three) times daily with meals.   potassium chloride SA 20 MEQ tablet Commonly known as: KLOR-CON Take 1 tablet (20 mEq total) by mouth daily as needed. What changed: when to take this   VITAMIN B-6 PO Take 1 tablet by mouth daily.       Follow-up Information    [name] Follow up.   Specialty: Home Health Services Why: to provide home health physical therapy Contact information: [address] [place] [address] [phone]              Allergies  Allergen Reactions  . Infliximab Other (See Comments)    REACTION: "Enlarged intestines and hernia. Also pushed intestines on lower part of lungs." REACTION: "Enlarged intestines and hernia. Also pushed intestines on lower part of lungs."  . Aspirin Nausea And Vomiting    Low Dose is ok Stomach problems  . Carvedilol Other (See Comments)    Fatigue/ ill Fatigue/ ill  . Codeine Nausea And Vomiting    "Deathly Sick"  . Contrast Media [Iodinated Diagnostic Agents] Other (See Comments)    All over body pain  . Penicillins Rash    Has patient had a PCN reaction causing immediate rash, facial/tongue/throat swelling, SOB or lightheadedness with hypotension: Y Has patient had a PCN reaction causing severe rash involving mucus membranes or skin necrosis: Y Has patient had a PCN reaction that required hospitalization: N Has patient had a PCN reaction occurring within the last 10 years: N If  all of the above answers are "NO", then may proceed with Cephalosporin use.     Consultations:  Urology  Procedures/Studies: CT ABDOMEN PELVIS WO CONTRAST  Result Date: 08/04/2020 CLINICAL DATA:  Lower abdominal pain.  Known colovesical fistula EXAM: CT ABDOMEN AND PELVIS WITHOUT CONTRAST TECHNIQUE: Multidetector CT imaging of the abdomen and pelvis was performed following the standard protocol  without oral or IV contrast. COMPARISON:  CT abdomen and pelvis February 10, 2020 and June 18, 2020; earlier CT October 13, 2018 FINDINGS: Lower chest: No edema or airspace opacity. Large hiatal type hernia is again noted. Foci of coronary artery calcification evident. Hepatobiliary: No focal liver lesions are appreciable on this noncontrast enhanced study. Gallbladder wall does not appear appreciably thickened. There is no appreciable biliary duct dilatation. Pancreas: No a pancreatic mass or inflammatory focus. Spleen: No splenic lesions evident. Adrenals/Urinary Tract: Adrenals bilaterally appear normal. There is stable left hydronephrosis and ureterectasis to the level of the left upper pelvis, similar to prior study. No renal mass on either side. No hydronephrosis on the right. No renal or ureteral calculi evident on either side. The urinary bladder wall remains markedly thickened with air in the urinary bladder. Loss of fat plane between sigmoid colon and urinary bladder, felt to be consistent with colovesical fistula, chronic. The air seen within the urinary bladder has been present previously and is felt to arise secondary to chronic colovesical fistula. Stomach/Bowel: There is extensive wall thickening throughout the sigmoid colon region, likely with chronic diverticulitis. Loss of fat plane between the sigmoid colon and bladder felt to represent chronic colovesical fistula. No new bowel inflammatory change evident. Scattered noninflamed diverticula noted throughout the colon. There is no appreciable bowel obstruction. The terminal ileal region appears normal. There is no pneumoperitoneum or portal venous air evident. Note that rectum is mildly distended with stool and air. No rectal wall thickening. Vascular/Lymphatic: There is no abdominal aortic aneurysm. There is extensive aortic and pelvic arterial vascular calcification as well as scattered foci of mesenteric arterial vascular calcification. No  adenopathy evident in the abdomen or pelvis. Reproductive: Uterus is anteverted and canted to the right. Previously noted complex partially cystic left adnexal mass is again noted measuring 5.8 x 5.5 cm. No new pelvic mass evident. Other: Appendix absent. No abscess or ascites evident in the abdomen or pelvis. Fat noted in each inguinal ring. Musculoskeletal: There is extensive arthropathy throughout the lumbar spine. No blastic or lytic bone lesions are evident. No intramuscular or abdominal wall lesions. IMPRESSION: 1. Chronic wall thickening in the sigmoid colon likely indicative of chronic diverticulitis with apparent colovesical fistula which has been noted previously. Scattered diverticula elsewhere in the colon. No bowel obstruction. No free air or portal venous air. 2. Diffuse thickening of the urinary bladder wall persists with air in the bladder consistent with chronic colovesical fistula. This degree of urinary bladder wall thickening is likely of inflammatory etiology. Underlying urinary bladder neoplasm could present in this manner. 3. Complex left adnexal region mass remains, similar to prior studies. This mass must be considered indeterminate for potential ovarian neoplastic lesion. 4. Aortic Atherosclerosis (ICD10-I70.0). Foci of coronary artery calcification as well as pelvic and mesenteric arterial calcification noted as well. 5.  Large hiatal hernia again noted. 6. Chronic hydronephrosis and ureterectasis to the level of the upper pelvis on the left with probable stricture in the ureter in the upper pelvic region. This apparent stricture not well seen on noncontrast enhanced study. Electronically Signed   By: Gwyndolyn Saxon  Jasmine December III M.D.   On: 08/04/2020 14:03    Subjective: States abd discomfort is improved today  Discharge Exam: Vitals:   08/07/20 0540 08/07/20 0540  BP: (!) 109/56 (!) 109/56  Pulse: 74 66  Resp: 14 14  Temp: 97.6 F (36.4 C) 97.6 F (36.4 C)  SpO2: 98% 99%    Vitals:   08/06/20 1341 08/06/20 2217 08/07/20 0540 08/07/20 0540  BP: 129/71 134/72 (!) 109/56 (!) 109/56  Pulse: 76 (!) 101 74 66  Resp: 16 14 14 14   Temp: 98.4 F (36.9 C) 99.8 F (37.7 C) 97.6 F (36.4 C) 97.6 F (36.4 C)  TempSrc: Oral Oral Oral Oral  SpO2: 100% 100% 98% 99%  Weight:   50.9 kg   Height:        General: Pt is alert, awake, not in acute distress Cardiovascular: RRR, S1/S2 +, no rubs, no gallops Respiratory: CTA bilaterally, no wheezing, no rhonchi Abdominal: Soft, NT, ND, bowel sounds + Extremities: no edema, no cyanosis   The results of significant diagnostics from this hospitalization (including imaging, microbiology, ancillary and laboratory) are listed below for reference.     Microbiology: Recent Results (from the past 240 hour(s))  Urine culture     Status: Abnormal   Collection Time: 08/04/20 11:19 AM   Specimen: Urine, Catheterized  Result Value Ref Range Status   Specimen Description   Final    URINE, CATHETERIZED Performed at [place] [address] [place], [place] [address]    Special Requests   Final    Normal Performed at [facility], [place] [address], [place], [place] [address]    Culture >=100,000 COLONIES/mL KLEBSIELLA OXYTOCA (A)  Final   Report Status 08/06/2020 FINAL  Final   Organism ID, Bacteria KLEBSIELLA OXYTOCA (A)  Final      Susceptibility   Klebsiella oxytoca - MIC*    AMPICILLIN >=32 RESISTANT Resistant     CEFAZOLIN 8 SENSITIVE Sensitive     CEFTRIAXONE <=0.25 SENSITIVE Sensitive     CIPROFLOXACIN <=0.25 SENSITIVE Sensitive     GENTAMICIN <=1 SENSITIVE Sensitive     IMIPENEM <=0.25 SENSITIVE Sensitive     NITROFURANTOIN 32 SENSITIVE Sensitive     TRIMETH/SULFA <=20 SENSITIVE Sensitive     AMPICILLIN/SULBACTAM 8 SENSITIVE Sensitive     PIP/TAZO <=4 SENSITIVE Sensitive     * >=100,000 COLONIES/mL KLEBSIELLA OXYTOCA  Respiratory Panel by RT PCR (Flu A&B, Covid) -  Nasopharyngeal Swab     Status: None   Collection Time: 08/04/20  2:58 PM   Specimen: Nasopharyngeal Swab  Result Value Ref Range Status   SARS Coronavirus 2 by RT PCR NEGATIVE NEGATIVE Final    Comment: (NOTE) SARS-CoV-2 target nucleic acids are NOT DETECTED.  The SARS-CoV-2 RNA is generally detectable in upper respiratoy specimens during the acute phase of infection. The lowest concentration of SARS-CoV-2 viral copies this assay can detect is 131 copies/mL. A negative result does not preclude SARS-Cov-2 infection and should not be used as the sole basis for treatment or other patient management decisions. A negative result may occur with  improper specimen collection/handling, submission of specimen other than nasopharyngeal swab, presence of viral mutation(s) within the areas targeted by this assay, and inadequate number of viral copies (<131 copies/mL). A negative result must be combined with clinical observations, patient history, and epidemiological information. The expected result is Negative.  Fact Sheet for Patients:  PinkCheek.be  Fact Sheet for Healthcare Providers:  GravelBags.it  This  test is no t yet approved or cleared by the [place] and  has been authorized for detection and/or diagnosis of SARS-CoV-2 by FDA under an Emergency Use Authorization (EUA). This EUA will remain  in effect (meaning this test can be used) for the duration of the COVID-19 declaration under Section 564(b)(1) of the Act, 21 U.S.C. section 360bbb-3(b)(1), unless the authorization is terminated or revoked sooner.     Influenza A by PCR NEGATIVE NEGATIVE Final   Influenza B by PCR NEGATIVE NEGATIVE Final    Comment: (NOTE) The Xpert Xpress SARS-CoV-2/FLU/RSV assay is intended as an aid in  the diagnosis of influenza from Nasopharyngeal swab specimens and  should not be used as a sole basis for treatment. Nasal washings and   aspirates are unacceptable for Xpert Xpress SARS-CoV-2/FLU/RSV  testing.  Fact Sheet for Patients: PinkCheek.be  Fact Sheet for Healthcare Providers: GravelBags.it  This test is not yet approved or cleared by the [place] FDA and  has been authorized for detection and/or diagnosis of SARS-CoV-2 by  FDA under an Emergency Use Authorization (EUA). This EUA will remain  in effect (meaning this test can be used) for the duration of the  Covid-19 declaration under Section 564(b)(1) of the Act, 21  U.S.C. section 360bbb-3(b)(1), unless the authorization is  terminated or revoked. Performed at [facility], [place] [address] [place], [place] [address]      Labs: BNP (last 3 results) Recent Labs    09/02/19 1727 08/04/20 1458  BNP 1,352.1* 614.4*   Basic Metabolic Panel: Recent Labs  Lab 08/04/20 1141 08/04/20 1458 08/05/20 0522 08/06/20 0517 08/07/20 0618  NA 138  --  138 138 139  K 4.3  --  4.5 4.4 4.3  CL 104  --  110 111 107  CO2 22  --  22 23 22   GLUCOSE 113*  --  104* 124* 91  BUN 29*  --  27* 20 18  CREATININE 1.22*  --  1.37* 1.15* 1.05*  CALCIUM 9.4  --  8.6* 8.5* 8.8*  MG  --  1.7  --   --   --   PHOS  --  3.7  --   --   --    Liver Function Tests: No results for input(s): AST, ALT, ALKPHOS, BILITOT, PROT, ALBUMIN in the last 168 hours. No results for input(s): LIPASE, AMYLASE in the last 168 hours. No results for input(s): AMMONIA in the last 168 hours. CBC: Recent Labs  Lab 08/04/20 1141 08/04/20 1458 08/05/20 0522 08/06/20 0517 08/07/20 0618  WBC 10.6* 9.4 8.3 8.1 9.2  NEUTROABS 7.8*  --   --   --   --   HGB 8.8* 8.2* 7.8* 8.0* 7.9*  HCT 29.7* 27.1* 25.9* 27.3* 26.6*  MCV 81.8 80.9 82.2 82.7 82.9  PLT 328 316 261 262 279   Cardiac Enzymes: No results for input(s): CKTOTAL, CKMB, CKMBINDEX, TROPONINI in the last 168 hours. BNP: Invalid input(s):  POCBNP CBG: Recent Labs  Lab 08/05/20 0631 08/05/20 0742 08/06/20 0731 08/07/20 0731  GLUCAP 129* 89 95 109*   D-Dimer No results for input(s): DDIMER in the last 72 hours. Hgb A1c Recent Labs    08/04/20 1557  HGBA1C 5.7*   Lipid Profile No results for input(s): CHOL, HDL, LDLCALC, TRIG, CHOLHDL, LDLDIRECT in the last 72 hours. Thyroid function studies Recent Labs    08/04/20 1458  TSH 0.787   Anemia work up [employer]    08/04/20  1458  TIBC 376  IRON 44   Urinalysis    Component Value Date/Time   COLORURINE YELLOW 08/04/2020 1119   APPEARANCEUR TURBID (A) 08/04/2020 1119   LABSPEC 1.019 08/04/2020 1119   PHURINE 5.0 08/04/2020 1119   GLUCOSEU NEGATIVE 08/04/2020 1119   HGBUR MODERATE (A) 08/04/2020 1119   BILIRUBINUR NEGATIVE 08/04/2020 1119   KETONESUR NEGATIVE 08/04/2020 1119   PROTEINUR 100 (A) 08/04/2020 1119   UROBILINOGEN 0.2 07/03/2014 0313   NITRITE NEGATIVE 08/04/2020 1119   LEUKOCYTESUR LARGE (A) 08/04/2020 1119   Sepsis Labs Invalid input(s): PROCALCITONIN,  WBC,  LACTICIDVEN Microbiology Recent Results (from the past 240 hour(s))  Urine culture     Status: Abnormal   Collection Time: 08/04/20 11:19 AM   Specimen: Urine, Catheterized  Result Value Ref Range Status   Specimen Description   Final    URINE, CATHETERIZED Performed at [facility], [place] [address], [place], [place] [address]    Special Requests   Final    Normal Performed at [facility], [place] [address], [place], [place] [address]    Culture >=100,000 COLONIES/mL KLEBSIELLA OXYTOCA (A)  Final   Report Status 08/06/2020 FINAL  Final   Organism ID, Bacteria KLEBSIELLA OXYTOCA (A)  Final      Susceptibility   Klebsiella oxytoca - MIC*    AMPICILLIN >=32 RESISTANT Resistant     CEFAZOLIN 8 SENSITIVE Sensitive     CEFTRIAXONE <=0.25 SENSITIVE Sensitive     CIPROFLOXACIN <=0.25 SENSITIVE Sensitive     GENTAMICIN <=1 SENSITIVE Sensitive      IMIPENEM <=0.25 SENSITIVE Sensitive     NITROFURANTOIN 32 SENSITIVE Sensitive     TRIMETH/SULFA <=20 SENSITIVE Sensitive     AMPICILLIN/SULBACTAM 8 SENSITIVE Sensitive     PIP/TAZO <=4 SENSITIVE Sensitive     * >=100,000 COLONIES/mL KLEBSIELLA OXYTOCA  Respiratory Panel by RT PCR (Flu A&B, Covid) - Nasopharyngeal Swab     Status: None   Collection Time: 08/04/20  2:58 PM   Specimen: Nasopharyngeal Swab  Result Value Ref Range Status   SARS Coronavirus 2 by RT PCR NEGATIVE NEGATIVE Final    Comment: (NOTE) SARS-CoV-2 target nucleic acids are NOT DETECTED.  The SARS-CoV-2 RNA is generally detectable in upper respiratoy specimens during the acute phase of infection. The lowest concentration of SARS-CoV-2 viral copies this assay can detect is 131 copies/mL. A negative result does not preclude SARS-Cov-2 infection and should not be used as the sole basis for treatment or other patient management decisions. A negative result may occur with  improper specimen collection/handling, submission of specimen other than nasopharyngeal swab, presence of viral mutation(s) within the areas targeted by this assay, and inadequate number of viral copies (<131 copies/mL). A negative result must be combined with clinical observations, patient history, and epidemiological information. The expected result is Negative.  Fact Sheet for Patients:  PinkCheek.be  Fact Sheet for Healthcare Providers:  GravelBags.it  This test is no t yet approved or cleared by the [place] FDA and  has been authorized for detection and/or diagnosis of SARS-CoV-2 by FDA under an Emergency Use Authorization (EUA). This EUA will remain  in effect (meaning this test can be used) for the duration of the COVID-19 declaration under Section 564(b)(1) of the Act, 21 U.S.C. section 360bbb-3(b)(1), unless the authorization is terminated or revoked sooner.      Influenza A by PCR NEGATIVE NEGATIVE Final   Influenza B by PCR NEGATIVE NEGATIVE Final    Comment: (  NOTE) The Xpert Xpress SARS-CoV-2/FLU/RSV assay is intended as an aid in  the diagnosis of influenza from Nasopharyngeal swab specimens and  should not be used as a sole basis for treatment. Nasal washings and  aspirates are unacceptable for Xpert Xpress SARS-CoV-2/FLU/RSV  testing.  Fact Sheet for Patients: PinkCheek.be  Fact Sheet for Healthcare Providers: GravelBags.it  This test is not yet approved or cleared by the [place] FDA and  has been authorized for detection and/or diagnosis of SARS-CoV-2 by  FDA under an Emergency Use Authorization (EUA). This EUA will remain  in effect (meaning this test can be used) for the duration of the  Covid-19 declaration under Section 564(b)(1) of the Act, 21  U.S.C. section 360bbb-3(b)(1), unless the authorization is  terminated or revoked. Performed at [facility], [place] [address], [place], [place] [address]    Time spent: 61min  SIGNED:   Marylu Lund, MD  [facility] 08/07/2020, 12:15 PM  If 7PM-7AM, please contact night-coverage

## 2020-08-07 NOTE — Progress Notes (Signed)
[facility] ([facility]) Community Based Palliative Care   °    °This patient is enrolled in our palliative care services in the community.  [facility] will continue to follow for any discharge planning needs and to coordinate continuation of palliative care.   °If you have questions or need assistance, please call [phone] or contact the hospital Liaison listed on AMION.    ° °Thank you for the opportunity to participate in this patient’s care. °    °Chrislyn King, BSN, RN °[facility] Hospital Liaison   °[phone] (24h on call) ° °

## 2020-08-09 ENCOUNTER — Telehealth: Payer: Self-pay

## 2020-08-09 NOTE — Telephone Encounter (Signed)
Unfortunately I am not able to send her order to another company. Choice of company is dictated by her insurance.

## 2020-08-09 NOTE — Telephone Encounter (Signed)
Transition Care Management Follow-up Telephone Call  Date of discharge and from where: 08/07/2020-  How have you been since you were released from the hospital? Good  Any questions or concerns? No  Items Reviewed:  Did the pt receive and understand the discharge instructions provided? Yes   Medications obtained and verified? Yes   Any new allergies since your discharge? No   Dietary orders reviewed? Yes  Do you have support at home? Yes   Functional Questionnaire: (I = Independent and D = Dependent) ADLs: I with assistance  Bathing/Dressing- I-with assistance  Meal Prep- D  Eating- I  Maintaining continence- I  Transferring/Ambulation- I  Managing Meds- I  Follow up appointments reviewed:   PCP Hospital f/u appt confirmed? Yes  Scheduled to see Wilfred Lacy on 08/18/20 @ 1:30.  [facility] f/u appt confirmed? Yes  Scheduled to see Urologist on 08/10/20 @ 2:30-per patient.  Are transportation arrangements needed? No   If their condition worsens, is the pt aware to call PCP or go to the Emergency Dept.? Yes  Was the patient provided with contact information for the PCP's office or ED? Yes  Was to pt encouraged to call back with questions or concerns? Yes

## 2020-08-09 NOTE — Telephone Encounter (Signed)
1st attempt TCM call. Left message for pt to call back.

## 2020-08-09 NOTE — Telephone Encounter (Signed)
Patient states she was told by the company who supplies her oxygen that the portable tanks are on back order. She is very upset by this because she says every time she has an appt she has to take the large oxygen tank with her and it is very difficult. She wants to know if you could possibly refer her to a different supply company for her oxygen.

## 2020-08-12 ENCOUNTER — Emergency Department (HOSPITAL_COMMUNITY): Payer: Medicare Other

## 2020-08-12 ENCOUNTER — Inpatient Hospital Stay (HOSPITAL_COMMUNITY)
Admission: EM | Admit: 2020-08-12 | Discharge: 2020-08-14 | DRG: 392 | Disposition: A | Discharge status: Home Health | Payer: Medicare Other | Attending: Internal Medicine | Admitting: Internal Medicine

## 2020-08-12 ENCOUNTER — Encounter (HOSPITAL_COMMUNITY): Payer: Self-pay

## 2020-08-12 ENCOUNTER — Other Ambulatory Visit: Payer: Self-pay

## 2020-08-12 DIAGNOSIS — Z87440 Personal history of urinary (tract) infections: Secondary | ICD-10-CM

## 2020-08-12 DIAGNOSIS — Z9981 Dependence on supplemental oxygen: Secondary | ICD-10-CM

## 2020-08-12 DIAGNOSIS — Z20822 Contact with and (suspected) exposure to covid-19: Secondary | ICD-10-CM | POA: Diagnosis not present

## 2020-08-12 DIAGNOSIS — Z825 Family history of asthma and other chronic lower respiratory diseases: Secondary | ICD-10-CM

## 2020-08-12 DIAGNOSIS — R1013 Epigastric pain: Secondary | ICD-10-CM

## 2020-08-12 DIAGNOSIS — I110 Hypertensive heart disease with heart failure: Secondary | ICD-10-CM | POA: Diagnosis present

## 2020-08-12 DIAGNOSIS — N1330 Unspecified hydronephrosis: Secondary | ICD-10-CM | POA: Diagnosis present

## 2020-08-12 DIAGNOSIS — I10 Essential (primary) hypertension: Secondary | ICD-10-CM | POA: Diagnosis present

## 2020-08-12 DIAGNOSIS — K5732 Diverticulitis of large intestine without perforation or abscess without bleeding: Secondary | ICD-10-CM | POA: Diagnosis not present

## 2020-08-12 DIAGNOSIS — R109 Unspecified abdominal pain: Secondary | ICD-10-CM | POA: Diagnosis present

## 2020-08-12 DIAGNOSIS — Z79899 Other long term (current) drug therapy: Secondary | ICD-10-CM

## 2020-08-12 DIAGNOSIS — Z880 Allergy status to penicillin: Secondary | ICD-10-CM

## 2020-08-12 DIAGNOSIS — K449 Diaphragmatic hernia without obstruction or gangrene: Secondary | ICD-10-CM | POA: Diagnosis not present

## 2020-08-12 DIAGNOSIS — R935 Abnormal findings on diagnostic imaging of other abdominal regions, including retroperitoneum: Secondary | ICD-10-CM

## 2020-08-12 DIAGNOSIS — I700 Atherosclerosis of aorta: Secondary | ICD-10-CM | POA: Diagnosis not present

## 2020-08-12 DIAGNOSIS — K2100 Gastro-esophageal reflux disease with esophagitis, without bleeding: Principal | ICD-10-CM | POA: Diagnosis present

## 2020-08-12 DIAGNOSIS — Z7982 Long term (current) use of aspirin: Secondary | ICD-10-CM

## 2020-08-12 DIAGNOSIS — N321 Vesicointestinal fistula: Secondary | ICD-10-CM | POA: Diagnosis present

## 2020-08-12 DIAGNOSIS — I5042 Chronic combined systolic (congestive) and diastolic (congestive) heart failure: Secondary | ICD-10-CM | POA: Diagnosis present

## 2020-08-12 DIAGNOSIS — Z79890 Hormone replacement therapy (postmenopausal): Secondary | ICD-10-CM

## 2020-08-12 DIAGNOSIS — J9859 Other diseases of mediastinum, not elsewhere classified: Secondary | ICD-10-CM | POA: Diagnosis not present

## 2020-08-12 DIAGNOSIS — I350 Nonrheumatic aortic (valve) stenosis: Secondary | ICD-10-CM | POA: Diagnosis present

## 2020-08-12 DIAGNOSIS — Z87891 Personal history of nicotine dependence: Secondary | ICD-10-CM

## 2020-08-12 DIAGNOSIS — D509 Iron deficiency anemia, unspecified: Secondary | ICD-10-CM | POA: Diagnosis present

## 2020-08-12 DIAGNOSIS — I4819 Other persistent atrial fibrillation: Secondary | ICD-10-CM | POA: Diagnosis not present

## 2020-08-12 DIAGNOSIS — Z8249 Family history of ischemic heart disease and other diseases of the circulatory system: Secondary | ICD-10-CM

## 2020-08-12 DIAGNOSIS — J9610 Chronic respiratory failure, unspecified whether with hypoxia or hypercapnia: Secondary | ICD-10-CM | POA: Diagnosis not present

## 2020-08-12 DIAGNOSIS — E039 Hypothyroidism, unspecified: Secondary | ICD-10-CM | POA: Diagnosis present

## 2020-08-12 LAB — CBC WITH DIFFERENTIAL/PLATELET
Abs Immature Granulocytes: 0.09 10*3/uL — ABNORMAL HIGH (ref 0.00–0.07)
Basophils Absolute: 0.1 10*3/uL (ref 0.0–0.1)
Basophils Relative: 1 %
Eosinophils Absolute: 0.2 10*3/uL (ref 0.0–0.5)
Eosinophils Relative: 2 %
HCT: 30.6 % — ABNORMAL LOW (ref 36.0–46.0)
Hemoglobin: 9.1 g/dL — ABNORMAL LOW (ref 12.0–15.0)
Immature Granulocytes: 1 %
Lymphocytes Relative: 16 %
Lymphs Abs: 2.2 10*3/uL (ref 0.7–4.0)
MCH: 24.4 pg — ABNORMAL LOW (ref 26.0–34.0)
MCHC: 29.7 g/dL — ABNORMAL LOW (ref 30.0–36.0)
MCV: 82 fL (ref 80.0–100.0)
Monocytes Absolute: 0.7 10*3/uL (ref 0.1–1.0)
Monocytes Relative: 5 %
Neutro Abs: 10.7 10*3/uL — ABNORMAL HIGH (ref 1.7–7.7)
Neutrophils Relative %: 75 %
Platelets: 338 10*3/uL (ref 150–400)
RBC: 3.73 MIL/uL — ABNORMAL LOW (ref 3.87–5.11)
RDW: 19.9 % — ABNORMAL HIGH (ref 11.5–15.5)
WBC: 13.9 10*3/uL — ABNORMAL HIGH (ref 4.0–10.5)
nRBC: 0 % (ref 0.0–0.2)

## 2020-08-12 LAB — COMPREHENSIVE METABOLIC PANEL
ALT: 22 U/L (ref 0–44)
AST: 38 U/L (ref 15–41)
Albumin: 3.6 g/dL (ref 3.5–5.0)
Alkaline Phosphatase: 87 U/L (ref 38–126)
Anion gap: 9 (ref 5–15)
BUN: 27 mg/dL — ABNORMAL HIGH (ref 8–23)
CO2: 23 mmol/L (ref 22–32)
Calcium: 8.9 mg/dL (ref 8.9–10.3)
Chloride: 106 mmol/L (ref 98–111)
Creatinine, Ser: 1.08 mg/dL — ABNORMAL HIGH (ref 0.44–1.00)
GFR, Estimated: 45 mL/min — ABNORMAL LOW (ref >60)
Glucose, Bld: 141 mg/dL — ABNORMAL HIGH (ref 70–99)
Potassium: 4.2 mmol/L (ref 3.5–5.1)
Sodium: 138 mmol/L (ref 135–145)
Total Bilirubin: 0.6 mg/dL (ref 0.3–1.2)
Total Protein: 7.8 g/dL (ref 6.5–8.1)

## 2020-08-12 LAB — TROPONIN I (HIGH SENSITIVITY)
Troponin I (High Sensitivity): 7 ng/L (ref <18)
Troponin I (High Sensitivity): 8 ng/L (ref <18)

## 2020-08-12 LAB — CBG MONITORING, ED: Glucose-Capillary: 123 mg/dL — ABNORMAL HIGH (ref 70–99)

## 2020-08-12 LAB — LACTIC ACID, PLASMA: Lactic Acid, Venous: 1.8 mmol/L (ref 0.5–1.9)

## 2020-08-12 LAB — LIPASE, BLOOD: Lipase: 83 U/L — ABNORMAL HIGH (ref 11–51)

## 2020-08-12 MED ORDER — MORPHINE SULFATE (PF) 2 MG/ML IV SOLN
2.0000 mg | Freq: Once | INTRAVENOUS | Status: AC
  Administered 2020-08-12: 2 mg via INTRAVENOUS
  Filled 2020-08-12: qty 1

## 2020-08-12 MED ORDER — PROMETHAZINE HCL 25 MG/ML IJ SOLN
12.5000 mg | Freq: Once | INTRAMUSCULAR | Status: AC
  Administered 2020-08-12: 12.5 mg via INTRAVENOUS
  Filled 2020-08-12: qty 1

## 2020-08-12 MED ORDER — ONDANSETRON HCL 4 MG/2ML IJ SOLN
4.0000 mg | Freq: Once | INTRAMUSCULAR | Status: AC
  Administered 2020-08-12: 4 mg via INTRAVENOUS
  Filled 2020-08-12: qty 2

## 2020-08-12 NOTE — ED Provider Notes (Signed)
  Physical Exam  BP (!) 181/111   Pulse 65   Temp 98.1 F (36.7 C) (Oral)   Resp 18   Ht 4\' 11"  (1.499 m)   Wt 50.9 kg   SpO2 96%   BMI 22.66 kg/m   Physical Exam  Gen: sleepy from the pain medication, able to respond/follow commands Abd: diffuse ttp. No peritonitis   ED Course/Procedures   Clinical Course as of Aug 12 2306  Thu Aug 12, 4865  10832 86 year old female history of A. fib here with upper abdominal pain and nausea after eating a big meal. She said she has had this on and off before. She looks uncomfortable and is tender in her epigastrium and subxiphoid area. EKG looks like her chronic A. fib. Getting labs troponin LFTs. Pain and nausea medication.   [MB]  2224 EKG shows atrial fibrillation rate of 92 low voltage nonspecific ST-T's similar to prior EKG 4/21.   [MB]    Clinical Course User Index [MB] Hayden Rasmussen, MD    Procedures  MDM   Patient signed out to me by Alvera Singh, PA-C. Please see previous notes for further history.  In brief, patient presenting for evaluation of epigastric abdominal pain. She has a complex medical history including chronic colovesical fistula, large hiatal hernia, and chronic UTIs. Recently seen and admitted for complicated UTI discharged on Keflex. Patient reports worsened abdominal pain this afternoon after eating a large meal. She has spit up, but no true vomiting. No fevers/chills. Additional history of A. fib not on anticoagulation. Cardiac work-up overall has been reassuring. Initial troponin negative, pending second. Labs show mild leukocytosis when compared to previous at 14. She is pending a CT, however at this time her symptoms are uncontrolled and she does not feel she can tolerate a CT. As such, she is received a second round of pain and nausea control, will reassess and reattempt CT.  CT shows worsening hiatal hernia now with stranding.  Also shows chronic inflammation as noted previously.  Per radiology, patient may  benefit from direct visualization, and clinically her sxs correlate with hiatal hernia.  After several rounds of pain and nausea medication, patient reports she is still having a lot of discomfort and feeling very nauseous.  As such, will call for admission. Protonix started.   Discussed with Dr[facility] Hal [facility], patient to be admitted      Franchot Heidelberg, PA-C 08/13/20 [place], Grayce Sessions, MD 08/13/20 [phone]

## 2020-08-12 NOTE — ED Triage Notes (Signed)
Pt presents from home, c/o generalized abd pain after eating dinner with constant nausea. States she believes she overate. Denies vomiting, fevers, normal BM earlier today

## 2020-08-12 NOTE — ED Notes (Signed)
Patient transported to X-ray 

## 2020-08-12 NOTE — ED Provider Notes (Signed)
[name]   CSN: [id number] Arrival date & time: 08/12/20  2104     History Chief Complaint  Patient presents with   Abdominal Pain    [name] is a 86 y.o. female.  Patient with history of atrial fibrillation not currently on anticoagulation, history of mixed congestive heart failure on Lasix, recent admission for complicated UTI 2/2 chronic colovesical fistula, discharged on Keflex, large hiatal hernia -- presents to the emergency department for evaluation of epigastric pain and left arm pain.  Patient states that she ate around 5 PM today (BBQ, cole slaw, and cake).  About an hour later she developed severe pain in the upper abdomen lower chest with associated nausea and left arm pain.  She thinks that she ate too much and that is why she is having pain and is "feeling so sick".  Patient denies shortness of breath or trouble breathing.  No vomiting or diaphoresis.  Patient denies diarrhea.  EMS was called for transport.  Patient is typically on 2 L nasal cannula supplemental oxygen at home per her report.  She denies history of coronary artery disease, previous stents, or MI.        Past Medical History:  Diagnosis Date   A-fib ([place])    Acute CHF (congestive heart failure) ([place]) 09/02/2019   Aortic stenosis, moderate    last echo in 2010; AVA 1.1; mild AS per echo in June 2013   Edema of both legs    s/p laser treatment per Dr. Donnetta Hutching   Hiatal hernia    History of chicken pox    HLD (hyperlipidemia)    HTN (hypertension)    Hypothyroidism    IBS (irritable bowel syndrome)    Iron deficiency anemia    Neuropathic pain 09/06/2015   Rheumatoid arthritis ([place])     Patient Active Problem List   Diagnosis Date Noted   GAD (generalized anxiety disorder) 05/20/2020   Chronic combined systolic and diastolic congestive heart failure ([place]) 04/21/2020   Hypoxia 80/99/8338   Metabolic acidosis  25/02/3975   Iron deficiency anemia 02/22/2020   Cystitis 02/10/2020   Aortic atherosclerosis ([place]) 73/41/9379   Complicated UTI (urinary tract infection) 08/21/2019   Colovesical fistula 05/28/2019   Polyneuropathy 11/06/2018   Goals of care, counseling/discussion    Hydronephrosis 10/13/2018   Ovarian mass 10/13/2018   C. difficile colitis 10/13/2018   Clostridium difficile colitis 09/24/2018   Hypokalemia    Hypomagnesemia    Left ovarian cyst    Acute diverticulitis 09/07/2018   Leucocytosis 09/06/2018   Malnutrition of moderate degree 07/26/2018   Perforation of sigmoid colon due to diverticulitis 07/25/2018   Sepsis ([place]) 07/24/2018   Generalized abdominal pain 02/06/2018   Paresthesia of both lower extremities 02/06/2018   Normocytic normochromic anemia 01/07/2018   Iron deficiency 09/19/2017   Increased platelet count 09/18/2017   Pyoderma gangrenosum 01/04/2017   Primary osteoarthritis of both hands 01/04/2017   Primary osteoarthritis of both feet 01/04/2017   Primary osteoarthritis of both knees 01/04/2017   Degenerative joint disease involving multiple joints 01/04/2017   Neuropathic pain 09/06/2015   Erythema nodosum 09/21/2014   Rheumatoid arthritis ([place]) 09/21/2014   Benign essential hypertension 09/02/2014   Rectal bleeding 05/15/2014   Persistent atrial fibrillation ([place]) 05/11/2014   Hyperlipidemia 04/02/2014   PAC (premature atrial contraction) 01/23/2014   Varicose veins of lower extremities with other complications 02/40/9735   Hypothyroid 06/21/2012   Localized edema 06/21/2012   Hypertension  04/19/2012   Edema of lower extremity 01/17/2012   Anxiety 11/05/2011   Aortic stenosis 03/22/2011    Past Surgical History:  Procedure Laterality Date   [place] Right    CATARACT EXTRACTION     ENDOSCOPIC VEIN LASER TREATMENT     ENDOVENOUS ABLATION SAPHENOUS VEIN W/  LASER  08-29-2012   left greater saphenous vein   Curt Jews MD   ENDOVENOUS ABLATION SAPHENOUS VEIN W/ LASER  09-19-2012   right greater saphenous vein by Curt Jews MD   EYE SURGERY  2005   Bilateral cataract   FOOT SURGERY  2003   bilateral Hammer Toe   stab phlebectomy Right 01-02-2013   10-15 incisions right thigh and calf by Curt Jews MD     OB History   No obstetric history on file.     Family History  Problem Relation Age of Onset   Heart disease Mother    Peripheral vascular disease Mother    Heart disease Father    Peripheral vascular disease Father        Right leg amputation   COPD Father    Heart disease Brother 25       Heart Disease before age 23   Heart attack Brother    Peripheral vascular disease Other    Hypertension Sister     Social History   Tobacco Use   Smoking status: Former Smoker    Types: Cigarettes    Quit date: 10/31/1983    Years since quitting: 36.8   Smokeless tobacco: Never Used  Vaping Use   Vaping Use: Never used  Substance Use Topics   Alcohol use: No   Drug use: No    Home Medications Prior to Admission medications   Medication Sig Start Date End Date Taking? Authorizing Provider  acetaminophen (TYLENOL) 500 MG tablet Take 1,000 mg by mouth every 8 (eight) hours as needed for moderate pain (joint pain).     [provider]  ALPRAZolam Duanne Moron) 0.25 MG tablet Take 1 tablet (0.25 mg total) by mouth at bedtime as needed for anxiety. FOR ANXIETY/RESTLESS LEGS Patient taking differently: Take 0.25 mg by mouth at bedtime as needed for anxiety.  05/20/20   Nche, Charlene Brooke, NP  aspirin 81 MG chewable tablet Chew 1 tablet (81 mg total) by mouth daily. 10/26/18   Hennie Duos, MD  cephALEXin (KEFLEX) 500 MG capsule Take 1 capsule (500 mg total) by mouth every 12 (twelve) hours for 7 days. 08/07/20 08/14/20  Donne Hazel, MD  fosfomycin (MONUROL) 3 g PACK Take 3 g by mouth once. Repeat in 10 days if  needed. 07/26/20   [provider]  furosemide (LASIX) 20 MG tablet Take 1 tablet (20 mg total) by mouth daily as needed. Patient taking differently: Take 20 mg by mouth daily as needed for fluid.  05/20/20   Nche, Charlene Brooke, NP  gabapentin (NEURONTIN) 100 MG capsule Take 100 mg by mouth daily.     [provider]  HYDROcodone-acetaminophen (NORCO/VICODIN) 5-325 MG tablet Take 1 tablet by mouth every 6 (six) hours as needed for moderate pain.  07/30/20   [provider]  levothyroxine (SYNTHROID) 75 MCG tablet TAKE 1 TABLET BY MOUTH ONCE DAILY BEFORE BREAKFAST Patient taking differently: Take 75 mcg by mouth daily before breakfast.  04/23/20   Nche, Charlene Brooke, NP  metoprolol succinate (TOPROL-XL) 25 MG 24 hr tablet Take 75 mg by mouth daily. 06/13/20  [provider]  Multiple Vitamin (MULTIVITAMIN WITH MINERALS) TABS tablet Take 1 tablet by mouth daily. Patient not taking: Reported on 08/04/2020 09/27/18   Raiford Noble Latif, DO  Olopatadine HCl 0.2 % SOLN Place 1 drop into both eyes daily. Patient taking differently: Place 1 drop into both eyes in the morning, at noon, in the evening, and at bedtime.  02/25/20   Wille Celeste, PA-C  phenazopyridine (PYRIDIUM) 100 MG tablet Take 1 tablet (100 mg total) by mouth 3 (three) times daily with meals. Patient not taking: Reported on 08/04/2020 07/15/20   Nche, Charlene Brooke, NP  potassium chloride SA (KLOR-CON) 20 MEQ tablet Take 1 tablet (20 mEq total) by mouth daily as needed. Patient taking differently: Take 20 mEq by mouth daily.  05/20/20   Nche, Charlene Brooke, NP  Pyridoxine HCl (VITAMIN B-6 PO) Take 1 tablet by mouth daily.    [provider]    Allergies    Infliximab, Aspirin, Carvedilol, Codeine, Contrast media [iodinated diagnostic agents], and Penicillins  Review of Systems   Review of Systems  Constitutional: Negative for diaphoresis and fever.  Eyes: Negative for redness.  Respiratory:  Negative for cough and shortness of breath.   Cardiovascular: Negative for chest pain, palpitations and leg swelling.  Gastrointestinal: Positive for abdominal pain and nausea. Negative for vomiting.  Genitourinary: Negative for dysuria.  Musculoskeletal: Positive for myalgias. Negative for back pain and neck pain.  Skin: Negative for rash.  Neurological: Negative for syncope and light-headedness.  Psychiatric/Behavioral: The patient is not nervous/anxious.     Physical Exam Updated Vital Signs BP (!) 169/91    Pulse 86    Temp 98.1 F (36.7 C) (Oral)    Resp 18    Ht 4\' 11"  (1.499 m)    Wt 50.9 kg    SpO2 97%    BMI 22.66 kg/m   Physical Exam Vitals and nursing Mcdonald reviewed.  Constitutional:      General: She is in acute distress (appears uncomfortable).     Appearance: She is well-developed. She is not diaphoretic.  HENT:     Head: Normocephalic and atraumatic.     Mouth/Throat:     Mouth: Mucous membranes are not dry.  Eyes:     Conjunctiva/sclera: Conjunctivae normal.  Neck:     Vascular: Normal carotid pulses. No carotid bruit or JVD.     Trachea: Trachea normal. No tracheal deviation.  Cardiovascular:     Rate and Rhythm: Normal rate and regular rhythm.     Pulses: No decreased pulses.     Heart sounds: Normal heart sounds, S1 normal and S2 normal. No murmur heard.   Pulmonary:     Effort: Pulmonary effort is normal. No respiratory distress.     Breath sounds: No wheezing.  Chest:     Chest wall: No tenderness.  Abdominal:     General: Bowel sounds are normal.     Palpations: Abdomen is soft.     Tenderness: There is abdominal tenderness (mild discomfort with palpation. ) in the epigastric area. There is no guarding or rebound.  Musculoskeletal:        General: Normal range of motion.     Cervical back: Normal range of motion and neck supple. No muscular tenderness.  Skin:    General: Skin is warm and dry.     Coloration: Skin is not pale.  Neurological:      Mental Status: She is alert.     ED Results /  Procedures / Treatments   Labs (all labs ordered are listed, but only abnormal results are displayed) Labs Reviewed  CBC WITH DIFFERENTIAL/PLATELET - Abnormal; Notable for the following components:      Result Value   WBC 13.9 (*)    RBC 3.73 (*)    Hemoglobin 9.1 (*)    HCT 30.6 (*)    MCH 24.4 (*)    MCHC 29.7 (*)    RDW 19.9 (*)    Neutro Abs 10.7 (*)    Abs Immature Granulocytes 0.09 (*)    All other components within normal limits  COMPREHENSIVE METABOLIC PANEL - Abnormal; Notable for the following components:   Glucose, Bld 141 (*)    BUN 27 (*)    Creatinine, Ser 1.08 (*)    GFR, Estimated 45 (*)    All other components within normal limits  LIPASE, BLOOD - Abnormal; Notable for the following components:   Lipase 83 (*)    All other components within normal limits  LACTIC ACID, PLASMA  TROPONIN I (HIGH SENSITIVITY)  TROPONIN I (HIGH SENSITIVITY)    EKG None  Radiology DG Chest 2 View  Result Date: 08/12/2020 CLINICAL DATA:  Epigastric pain EXAM: CHEST - 2 VIEW COMPARISON:  04/21/2020 FINDINGS: Stable cardiomediastinal contours. Moderate-sized hiatal hernia. Atherosclerotic calcification of the aortic knob. No focal airspace consolidation, pleural effusion, or pneumothorax. Exaggerated thoracic kyphosis. IMPRESSION: No active cardiopulmonary disease. Electronically Signed   By: Davina Poke D.O.   On: 08/12/2020 21:56    Procedures Procedures (including critical care time)  Medications Ordered in ED Medications  promethazine (PHENERGAN) injection 12.5 mg (has no administration in time range)  morphine 2 MG/ML injection 2 mg (has no administration in time range)  morphine 2 MG/ML injection 2 mg (2 mg Intravenous Given 08/12/20 2132)  ondansetron (ZOFRAN) injection 4 mg (4 mg Intravenous Given 08/12/20 2132)    ED Course  I have reviewed the triage vital signs and the nursing notes.  Pertinent labs &  imaging results that were available during my care of the patient were reviewed by me and considered in my medical decision making (see chart for details).  Patient seen and examined. Work-up initiated. Medications ordered.   Vital signs reviewed and are as follows: BP (!) 169/91    Pulse 86    Temp 98.1 F (36.7 C) (Oral)    Resp 18    Ht 4\' 11"  (1.499 m)    Wt 50.9 kg    SpO2 97%    BMI 22.66 kg/m   Reviewed EKG - shows atrial fibrillation.   10:51 PM Reviewed results. From a cardiac standpoint her labs are reassuring.   Pt updated. She continues to complain of pain and nausea. I talked to her about CT scan. She states that she can't tolerate it right now due to symptoms. She says repeatedly that she just can't take it anymore. Will give additional nausea and pain medications. I told her I was worried about her, discussed the possibility of an infection with her high WBC count, blood clot, bowel blockage. Hopefully she will consent to CT.    11:06 PM Signout to [place] PA-C at shift change.   Clinical Course as of Aug 12 2229  Thu Aug 13, 7455  515 86 year old female history of A. fib here with upper abdominal pain and nausea after eating a big meal. She said she has had this on and off before. She looks uncomfortable and is tender in her epigastrium and  subxiphoid area. EKG looks like her chronic A. fib. Getting labs troponin LFTs. Pain and nausea medication.   [MB]  2224 EKG shows atrial fibrillation rate of 92 low voltage nonspecific ST-T's similar to prior EKG 4/21.   [MB]    Clinical Course User Index [MB] Hayden Rasmussen, MD   MDM Rules/Calculators/A&P                          Pending completion of work-up.    Final Clinical Impression(s) / ED Diagnoses Final diagnoses:  Epigastric pain    Rx / DC Orders ED Discharge Orders    None       Carlisle Cater, Hershal Coria 08/12/20 2306    Hayden Rasmussen, MD 08/13/20 1252

## 2020-08-13 ENCOUNTER — Other Ambulatory Visit: Payer: Self-pay

## 2020-08-13 ENCOUNTER — Encounter (HOSPITAL_COMMUNITY): Payer: Self-pay | Admitting: Internal Medicine

## 2020-08-13 DIAGNOSIS — R1013 Epigastric pain: Secondary | ICD-10-CM | POA: Diagnosis not present

## 2020-08-13 DIAGNOSIS — I4819 Other persistent atrial fibrillation: Secondary | ICD-10-CM

## 2020-08-13 DIAGNOSIS — Z7982 Long term (current) use of aspirin: Secondary | ICD-10-CM | POA: Diagnosis not present

## 2020-08-13 DIAGNOSIS — E039 Hypothyroidism, unspecified: Secondary | ICD-10-CM | POA: Diagnosis present

## 2020-08-13 DIAGNOSIS — Z880 Allergy status to penicillin: Secondary | ICD-10-CM | POA: Diagnosis not present

## 2020-08-13 DIAGNOSIS — Z79899 Other long term (current) drug therapy: Secondary | ICD-10-CM | POA: Diagnosis not present

## 2020-08-13 DIAGNOSIS — I5042 Chronic combined systolic (congestive) and diastolic (congestive) heart failure: Secondary | ICD-10-CM | POA: Diagnosis present

## 2020-08-13 DIAGNOSIS — R935 Abnormal findings on diagnostic imaging of other abdominal regions, including retroperitoneum: Secondary | ICD-10-CM | POA: Diagnosis not present

## 2020-08-13 DIAGNOSIS — K2100 Gastro-esophageal reflux disease with esophagitis, without bleeding: Secondary | ICD-10-CM | POA: Diagnosis present

## 2020-08-13 DIAGNOSIS — Z825 Family history of asthma and other chronic lower respiratory diseases: Secondary | ICD-10-CM | POA: Diagnosis not present

## 2020-08-13 DIAGNOSIS — D509 Iron deficiency anemia, unspecified: Secondary | ICD-10-CM | POA: Diagnosis present

## 2020-08-13 DIAGNOSIS — J9610 Chronic respiratory failure, unspecified whether with hypoxia or hypercapnia: Secondary | ICD-10-CM | POA: Diagnosis present

## 2020-08-13 DIAGNOSIS — K449 Diaphragmatic hernia without obstruction or gangrene: Secondary | ICD-10-CM | POA: Diagnosis present

## 2020-08-13 DIAGNOSIS — Z20822 Contact with and (suspected) exposure to covid-19: Secondary | ICD-10-CM | POA: Diagnosis present

## 2020-08-13 DIAGNOSIS — K5732 Diverticulitis of large intestine without perforation or abscess without bleeding: Secondary | ICD-10-CM | POA: Diagnosis present

## 2020-08-13 DIAGNOSIS — Z8249 Family history of ischemic heart disease and other diseases of the circulatory system: Secondary | ICD-10-CM | POA: Diagnosis not present

## 2020-08-13 DIAGNOSIS — Z87440 Personal history of urinary (tract) infections: Secondary | ICD-10-CM | POA: Diagnosis not present

## 2020-08-13 DIAGNOSIS — I350 Nonrheumatic aortic (valve) stenosis: Secondary | ICD-10-CM | POA: Diagnosis present

## 2020-08-13 DIAGNOSIS — Z9981 Dependence on supplemental oxygen: Secondary | ICD-10-CM | POA: Diagnosis not present

## 2020-08-13 DIAGNOSIS — I10 Essential (primary) hypertension: Secondary | ICD-10-CM | POA: Diagnosis not present

## 2020-08-13 DIAGNOSIS — Z79890 Hormone replacement therapy (postmenopausal): Secondary | ICD-10-CM | POA: Diagnosis not present

## 2020-08-13 DIAGNOSIS — I110 Hypertensive heart disease with heart failure: Secondary | ICD-10-CM | POA: Diagnosis present

## 2020-08-13 DIAGNOSIS — R109 Unspecified abdominal pain: Secondary | ICD-10-CM | POA: Diagnosis present

## 2020-08-13 DIAGNOSIS — Z87891 Personal history of nicotine dependence: Secondary | ICD-10-CM | POA: Diagnosis not present

## 2020-08-13 DIAGNOSIS — N321 Vesicointestinal fistula: Secondary | ICD-10-CM | POA: Diagnosis present

## 2020-08-13 DIAGNOSIS — N1330 Unspecified hydronephrosis: Secondary | ICD-10-CM | POA: Diagnosis present

## 2020-08-13 LAB — CBC WITH DIFFERENTIAL/PLATELET
Abs Immature Granulocytes: 0.09 10*3/uL — ABNORMAL HIGH (ref 0.00–0.07)
Basophils Absolute: 0 10*3/uL (ref 0.0–0.1)
Basophils Relative: 0 %
Eosinophils Absolute: 0 10*3/uL (ref 0.0–0.5)
Eosinophils Relative: 0 %
HCT: 29.4 % — ABNORMAL LOW (ref 36.0–46.0)
Hemoglobin: 8.8 g/dL — ABNORMAL LOW (ref 12.0–15.0)
Immature Granulocytes: 1 %
Lymphocytes Relative: 7 %
Lymphs Abs: 1 10*3/uL (ref 0.7–4.0)
MCH: 24.3 pg — ABNORMAL LOW (ref 26.0–34.0)
MCHC: 29.9 g/dL — ABNORMAL LOW (ref 30.0–36.0)
MCV: 81.2 fL (ref 80.0–100.0)
Monocytes Absolute: 0.5 10*3/uL (ref 0.1–1.0)
Monocytes Relative: 4 %
Neutro Abs: 13 10*3/uL — ABNORMAL HIGH (ref 1.7–7.7)
Neutrophils Relative %: 88 %
Platelets: 309 10*3/uL (ref 150–400)
RBC: 3.62 MIL/uL — ABNORMAL LOW (ref 3.87–5.11)
RDW: 19.9 % — ABNORMAL HIGH (ref 11.5–15.5)
WBC: 14.6 10*3/uL — ABNORMAL HIGH (ref 4.0–10.5)
nRBC: 0 % (ref 0.0–0.2)

## 2020-08-13 LAB — BASIC METABOLIC PANEL
Anion gap: 11 (ref 5–15)
BUN: 25 mg/dL — ABNORMAL HIGH (ref 8–23)
CO2: 24 mmol/L (ref 22–32)
Calcium: 8.8 mg/dL — ABNORMAL LOW (ref 8.9–10.3)
Chloride: 104 mmol/L (ref 98–111)
Creatinine, Ser: 1.02 mg/dL — ABNORMAL HIGH (ref 0.44–1.00)
GFR, Estimated: 48 mL/min — ABNORMAL LOW (ref >60)
Glucose, Bld: 176 mg/dL — ABNORMAL HIGH (ref 70–99)
Potassium: 4.6 mmol/L (ref 3.5–5.1)
Sodium: 139 mmol/L (ref 135–145)

## 2020-08-13 LAB — URINALYSIS, ROUTINE W REFLEX MICROSCOPIC
Bilirubin Urine: NEGATIVE
Glucose, UA: NEGATIVE mg/dL
Hgb urine dipstick: NEGATIVE
Ketones, ur: NEGATIVE mg/dL
Nitrite: NEGATIVE
Protein, ur: NEGATIVE mg/dL
Specific Gravity, Urine: 1.019 (ref 1.005–1.030)
WBC, UA: >50 WBC/hpf — ABNORMAL HIGH (ref 0–5)
pH: 5 (ref 5.0–8.0)

## 2020-08-13 LAB — HEPATIC FUNCTION PANEL
ALT: 22 U/L (ref 0–44)
AST: 38 U/L (ref 15–41)
Albumin: 3.5 g/dL (ref 3.5–5.0)
Alkaline Phosphatase: 85 U/L (ref 38–126)
Bilirubin, Direct: 0.1 mg/dL (ref 0.0–0.2)
Indirect Bilirubin: 0.6 mg/dL (ref 0.3–0.9)
Total Bilirubin: 0.7 mg/dL (ref 0.3–1.2)
Total Protein: 7.5 g/dL (ref 6.5–8.1)

## 2020-08-13 LAB — RESPIRATORY PANEL BY RT PCR (FLU A&B, COVID)
Influenza A by PCR: NEGATIVE
Influenza B by PCR: NEGATIVE
SARS Coronavirus 2 by RT PCR: NEGATIVE

## 2020-08-13 LAB — GLUCOSE, CAPILLARY
Glucose-Capillary: 116 mg/dL — ABNORMAL HIGH (ref 70–99)
Glucose-Capillary: 94 mg/dL (ref 70–99)

## 2020-08-13 MED ORDER — METOPROLOL TARTRATE 5 MG/5ML IV SOLN
2.5000 mg | Freq: Four times a day (QID) | INTRAVENOUS | Status: DC
  Administered 2020-08-13 – 2020-08-14 (×6): 2.5 mg via INTRAVENOUS
  Filled 2020-08-13 (×5): qty 5

## 2020-08-13 MED ORDER — SODIUM CHLORIDE 0.9 % IV SOLN
500.0000 mg | Freq: Two times a day (BID) | INTRAVENOUS | Status: DC
  Administered 2020-08-13 (×2): 500 mg via INTRAVENOUS
  Filled 2020-08-13 (×2): qty 0.5

## 2020-08-13 MED ORDER — DEXTROSE-NACL 5-0.9 % IV SOLN: INTRAVENOUS | Status: DC

## 2020-08-13 MED ORDER — PANTOPRAZOLE SODIUM 40 MG IV SOLR
40.0000 mg | Freq: Once | INTRAVENOUS | Status: AC
  Administered 2020-08-13: 40 mg via INTRAVENOUS
  Filled 2020-08-13: qty 40

## 2020-08-13 MED ORDER — GABAPENTIN 100 MG PO CAPS
100.0000 mg | ORAL_CAPSULE | Freq: Once | ORAL | Status: AC
  Administered 2020-08-13: 100 mg via ORAL
  Filled 2020-08-13: qty 1

## 2020-08-13 MED ORDER — FENTANYL CITRATE (PF) 100 MCG/2ML IJ SOLN
25.0000 ug | Freq: Once | INTRAMUSCULAR | Status: DC
  Filled 2020-08-13: qty 2

## 2020-08-13 MED ORDER — MORPHINE SULFATE (PF) 2 MG/ML IV SOLN: 1.0000 mg | INTRAVENOUS | Status: DC | PRN

## 2020-08-13 MED ORDER — SODIUM CHLORIDE 0.9 % IV SOLN
2.0000 g | INTRAVENOUS | Status: DC
  Administered 2020-08-14: 2 g via INTRAVENOUS
  Filled 2020-08-13: qty 2

## 2020-08-13 MED ORDER — PANTOPRAZOLE SODIUM 40 MG IV SOLR
40.0000 mg | Freq: Two times a day (BID) | INTRAVENOUS | Status: DC
  Administered 2020-08-13: 40 mg via INTRAVENOUS
  Filled 2020-08-13: qty 40

## 2020-08-13 MED ORDER — ONDANSETRON HCL 4 MG/2ML IJ SOLN
4.0000 mg | Freq: Three times a day (TID) | INTRAMUSCULAR | Status: DC | PRN
  Administered 2020-08-13: 4 mg via INTRAVENOUS
  Filled 2020-08-13: qty 2

## 2020-08-13 MED ORDER — ONDANSETRON HCL 4 MG/2ML IJ SOLN
4.0000 mg | Freq: Once | INTRAMUSCULAR | Status: AC
  Administered 2020-08-13: 4 mg via INTRAVENOUS
  Filled 2020-08-13: qty 2

## 2020-08-13 MED ORDER — LEVOTHYROXINE SODIUM 100 MCG/5ML IV SOLN
37.5000 ug | Freq: Every day | INTRAVENOUS | Status: DC
  Administered 2020-08-13 – 2020-08-14 (×2): 37.5 ug via INTRAVENOUS
  Filled 2020-08-13 (×2): qty 5

## 2020-08-13 MED ORDER — ALPRAZOLAM 0.25 MG PO TABS
0.2500 mg | ORAL_TABLET | Freq: Once | ORAL | Status: AC
  Administered 2020-08-13: 0.25 mg via ORAL
  Filled 2020-08-13: qty 1

## 2020-08-13 MED ORDER — PANTOPRAZOLE SODIUM 40 MG PO TBEC
40.0000 mg | DELAYED_RELEASE_TABLET | Freq: Every day | ORAL | Status: DC
  Filled 2020-08-13: qty 1

## 2020-08-13 NOTE — H&P (Addendum)
History and Physical    [name] MGQ:[id number] DOB: 05/04/1927 DOA: 08/12/2020  PCP: [name], NP  Patient coming from: Home.  Chief Complaint: Epigastric pain.  HPI: [name] is a 86 y.o. female with history of chronic systolic and diastolic CHF, paroxysmal atrial fibrillation not on anticoagulation, hypertension, aortic stenosis, hypothyroidism, chronic iron deficiency anemia who was recently admitted for complicated UTI was discharged home after initially being treated on meropenem with Keflex was doing well and had gone to get her head done and later in the evening yesterday patient ate dinner when patient states that she may have swallowed fast when she started having severe epigastric pain with some one episode of vomiting.  Denies any blood in the vomitus had had normal bowel movement yesterday.  The pain was worsening and persistent and patient decided to come to the ER.  ED Course: In the ER patient was afebrile not hypoxic.  CT abdomen pelvis shows chronic features in the abdomen including chronic diverticulitis with chronic colovesicular and coloovarian fistulas with persistent left-sided hydronephrosis.  The CAT scan also showing large fluid-filled hiatal hernia with the inflammation which has increased in size from previous.  Patient was placed on Protonix and admitted for further observation.  Covid test was negative labs show WBC count of 13.9 lactic acid was normal high sensitive troponin was negative.  Review of Systems: As per HPI, rest all negative.   Past Medical History:  Diagnosis Date  . A-fib ([place])   . Acute CHF (congestive heart failure) ([place]) 09/02/2019  . Aortic stenosis, moderate    last echo in 2010; AVA 1.1; mild AS per echo in June 2013  . Edema of both legs    s/p laser treatment per Dr. Donnetta Hutching  . Hiatal hernia   . History of chicken pox   . HLD (hyperlipidemia)   . HTN (hypertension)   . Hypothyroidism   . IBS (irritable  bowel syndrome)   . Iron deficiency anemia   . Neuropathic pain 09/06/2015  . Rheumatoid arthritis [facility])     Past Surgical History:  Procedure Laterality Date  . APPENDECTOMY  1955  . CARPAL TUNNEL RELEASE Right   . CATARACT EXTRACTION    . ENDOSCOPIC VEIN LASER TREATMENT    . ENDOVENOUS ABLATION SAPHENOUS VEIN W/ LASER  08-29-2012   left greater saphenous vein   Sherren Mocha Early MD  . ENDOVENOUS ABLATION SAPHENOUS VEIN W/ LASER  09-19-2012   right greater saphenous vein by Curt Jews MD  . EYE SURGERY  2005   Bilateral cataract  . FOOT SURGERY  2003   bilateral Hammer Toe  . stab phlebectomy Right 01-02-2013   10-15 incisions right thigh and calf by Curt Jews MD     reports that she quit smoking about 36 years ago. Her smoking use included cigarettes. She has never used smokeless tobacco. She reports that she does not drink alcohol and does not use drugs.  Allergies  Allergen Reactions  . Infliximab Other (See Comments)    REACTION: "Enlarged intestines and hernia. Also pushed intestines on lower part of lungs." REACTION: "Enlarged intestines and hernia. Also pushed intestines on lower part of lungs."  . Aspirin Nausea And Vomiting    Low Dose is ok Stomach problems  . Carvedilol Other (See Comments)    Fatigue/ ill Fatigue/ ill  . Codeine Nausea And Vomiting    "Deathly Sick"  . Contrast Media [Iodinated Diagnostic Agents] Other (See Comments)    All  over body pain  . Penicillins Rash    Has patient had a PCN reaction causing immediate rash, facial/tongue/throat swelling, SOB or lightheadedness with hypotension: Y Has patient had a PCN reaction causing severe rash involving mucus membranes or skin necrosis: Y Has patient had a PCN reaction that required hospitalization: N Has patient had a PCN reaction occurring within the last 10 years: N If all of the above answers are "NO", then may proceed with Cephalosporin use.     Family History  Problem Relation Age of Onset  .  Heart disease Mother   . Peripheral vascular disease Mother   . Heart disease Father   . Peripheral vascular disease Father        Right leg amputation  . COPD Father   . Heart disease Brother 25       Heart Disease before age 45  . Heart attack Brother   . Peripheral vascular disease Other   . Hypertension Sister     Prior to Admission medications   Medication Sig Start Date End Date Taking? Authorizing Provider  acetaminophen (TYLENOL) 500 MG tablet Take 1,000 mg by mouth every 8 (eight) hours as needed for moderate pain (joint pain).    Yes [provider]  ALPRAZolam (XANAX) 0.25 MG tablet Take 1 tablet (0.25 mg total) by mouth at bedtime as needed for anxiety. FOR ANXIETY/RESTLESS LEGS Patient taking differently: Take 0.25 mg by mouth at bedtime as needed for anxiety.  05/20/20  Yes Nche, Charlene Brooke, NP  aspirin 81 MG chewable tablet Chew 1 tablet (81 mg total) by mouth daily. 10/26/18  Yes Hennie Duos, MD  cephALEXin (KEFLEX) 500 MG capsule Take 1 capsule (500 mg total) by mouth every 12 (twelve) hours for 7 days. 08/07/20 08/14/20 Yes Donne Hazel, MD  fosfomycin (MONUROL) 3 g PACK Take 3 g by mouth once. Repeat in 10 days if needed. 07/26/20  Yes [provider]  gabapentin (NEURONTIN) 100 MG capsule Take 100 mg by mouth daily.    Yes [provider]  HYDROcodone-acetaminophen (NORCO/VICODIN) 5-325 MG tablet Take 1 tablet by mouth every 6 (six) hours as needed for moderate pain.  07/30/20  Yes [provider]  levothyroxine (SYNTHROID) 75 MCG tablet TAKE 1 TABLET BY MOUTH ONCE DAILY BEFORE BREAKFAST Patient taking differently: Take 75 mcg by mouth daily before breakfast.  04/23/20  Yes Nche, Charlene Brooke, NP  metoprolol succinate (TOPROL-XL) 25 MG 24 hr tablet Take 75 mg by mouth daily. 06/13/20  Yes [provider]  Olopatadine HCl 0.2 % SOLN Place 1 drop into both eyes daily. Patient taking differently: Place 1 drop into both  eyes in the morning, at noon, in the evening, and at bedtime.  02/25/20  Yes Lassen, Arlo C, PA-C  potassium chloride SA (KLOR-CON) 20 MEQ tablet Take 1 tablet (20 mEq total) by mouth daily as needed. Patient taking differently: Take 20 mEq by mouth daily.  05/20/20  Yes Nche, Charlene Brooke, NP  Pyridoxine HCl (VITAMIN B-6 PO) Take 1 tablet by mouth daily.   Yes [provider]  furosemide (LASIX) 20 MG tablet Take 1 tablet (20 mg total) by mouth daily as needed. Patient not taking: Reported on 08/12/2020 05/20/20   Nche, Charlene Brooke, NP  Multiple Vitamin (MULTIVITAMIN WITH MINERALS) TABS tablet Take 1 tablet by mouth daily. Patient not taking: Reported on 08/04/2020 09/27/18   Raiford Noble Latif, DO  phenazopyridine (PYRIDIUM) 100 MG tablet Take 1 tablet (100 mg total)  by mouth 3 (three) times daily with meals. Patient not taking: Reported on 08/04/2020 07/15/20   Nche, Charlene Brooke, NP    Physical Exam: Constitutional: Moderately built and nourished. Vitals:   08/12/20 2120 08/12/20 2305 08/13/20 0117  BP: (!) 169/91 (!) 181/111 (!) 180/118  Pulse: 86 65 (!) 103  Resp: 18 18 15   Temp: 98.1 F (36.7 C)    TempSrc: Oral    SpO2: 97% 96% 98%  Weight: 50.9 kg    Height: 4\' 11"  (1.499 m)     Eyes: Anicteric no pallor. ENMT: No discharge from the ears eyes nose or mouth. Neck: No mass felt.  No neck rigidity. Respiratory: No rhonchi or crepitations. Cardiovascular: S1-S2 heard. Abdomen: Soft nontender bowel sounds present. Musculoskeletal: No edema.   Skin: No rash. Neurologic: Alert awake oriented to time place and person.  Moves all extremities. Psychiatric: Appears normal.  Normal affect.   Labs on Admission: I have personally reviewed following labs and imaging studies  CBC: Recent Labs  Lab 08/06/20 0517 08/07/20 0618 08/12/20 2133  WBC 8.1 9.2 13.9*  NEUTROABS  --   --  10.7*  HGB 8.0* 7.9* 9.1*  HCT 27.3* 26.6* 30.6*  MCV 82.7 82.9 82.0  PLT 262 279 841    Basic Metabolic Panel: Recent Labs  Lab 08/06/20 0517 08/07/20 0618 08/12/20 2133  NA 138 139 138  K 4.4 4.3 4.2  CL 111 107 106  CO2 23 22 23   GLUCOSE 124* 91 141*  BUN 20 18 27*  CREATININE 1.15* 1.05* 1.08*  CALCIUM 8.5* 8.8* 8.9   GFR: Estimated Creatinine Clearance: 23.1 mL/min (A) (by C-G formula based on SCr of 1.08 mg/dL (H)). Liver Function Tests: Recent Labs  Lab 08/12/20 2133  AST 38  ALT 22  ALKPHOS 87  BILITOT 0.6  PROT 7.8  ALBUMIN 3.6   Recent Labs  Lab 08/12/20 2133  LIPASE 83*   No results for input(s): AMMONIA in the last 168 hours. Coagulation Profile: No results for input(s): INR, PROTIME in the last 168 hours. Cardiac Enzymes: No results for input(s): CKTOTAL, CKMB, CKMBINDEX, TROPONINI in the last 168 hours. BNP (last 3 results) No results for input(s): PROBNP in the last 8760 hours. HbA1C: No results for input(s): HGBA1C in the last 72 hours. CBG: Recent Labs  Lab 08/06/20 0731 08/07/20 0731 08/12/20 2325  GLUCAP 95 109* 123*   Lipid Profile: No results for input(s): CHOL, HDL, LDLCALC, TRIG, CHOLHDL, LDLDIRECT in the last 72 hours. Thyroid Function Tests: No results for input(s): TSH, T4TOTAL, FREET4, T3FREE, THYROIDAB in the last 72 hours. Anemia Panel: No results for input(s): VITAMINB12, FOLATE, FERRITIN, TIBC, IRON, RETICCTPCT in the last 72 hours. Urine analysis:    Component Value Date/Time   COLORURINE YELLOW 08/04/2020 1119   APPEARANCEUR TURBID (A) 08/04/2020 1119   LABSPEC 1.019 08/04/2020 1119   PHURINE 5.0 08/04/2020 1119   GLUCOSEU NEGATIVE 08/04/2020 1119   HGBUR MODERATE (A) 08/04/2020 1119   BILIRUBINUR NEGATIVE 08/04/2020 1119   KETONESUR NEGATIVE 08/04/2020 1119   PROTEINUR 100 (A) 08/04/2020 1119   UROBILINOGEN 0.2 07/03/2014 0313   NITRITE NEGATIVE 08/04/2020 1119   LEUKOCYTESUR LARGE (A) 08/04/2020 1119   Sepsis Labs: @LABRCNTIP (procalcitonin:4,lacticidven:4) ) Recent Results (from the past 240  hour(s))  Urine culture     Status: Abnormal   Collection Time: 08/04/20 11:19 AM   Specimen: Urine, Catheterized  Result Value Ref Range Status   Specimen Description   Final    URINE, CATHETERIZED  Performed at [facility], [place] [address], [place], [place] [address]    Special Requests   Final    Normal Performed at [facility], [place] [address] [place], [place] [address]    Culture >=100,000 COLONIES/mL KLEBSIELLA OXYTOCA (A)  Final   Report Status 08/06/2020 FINAL  Final   Organism ID, Bacteria KLEBSIELLA OXYTOCA (A)  Final      Susceptibility   Klebsiella oxytoca - MIC*    AMPICILLIN >=32 RESISTANT Resistant     CEFAZOLIN 8 SENSITIVE Sensitive     CEFTRIAXONE <=0.25 SENSITIVE Sensitive     CIPROFLOXACIN <=0.25 SENSITIVE Sensitive     GENTAMICIN <=1 SENSITIVE Sensitive     IMIPENEM <=0.25 SENSITIVE Sensitive     NITROFURANTOIN 32 SENSITIVE Sensitive     TRIMETH/SULFA <=20 SENSITIVE Sensitive     AMPICILLIN/SULBACTAM 8 SENSITIVE Sensitive     PIP/TAZO <=4 SENSITIVE Sensitive     * >=100,000 COLONIES/mL KLEBSIELLA OXYTOCA  Respiratory Panel by RT PCR (Flu A&B, Covid) - Nasopharyngeal Swab     Status: None   Collection Time: 08/04/20  2:58 PM   Specimen: Nasopharyngeal Swab  Result Value Ref Range Status   SARS Coronavirus 2 by RT PCR NEGATIVE NEGATIVE Final    Comment: (NOTE) SARS-CoV-2 target nucleic acids are NOT DETECTED.  The SARS-CoV-2 RNA is generally detectable in upper respiratoy specimens during the acute phase of infection. The lowest concentration of SARS-CoV-2 viral copies this assay can detect is 131 copies/mL. A negative result does not preclude SARS-Cov-2 infection and should not be used as the sole basis for treatment or other patient management decisions. A negative result may occur with  improper specimen collection/handling, submission of specimen other than nasopharyngeal swab, presence of viral mutation(s) within  the areas targeted by this assay, and inadequate number of viral copies (<131 copies/mL). A negative result must be combined with clinical observations, patient history, and epidemiological information. The expected result is Negative.  Fact Sheet for Patients:  PinkCheek.be  Fact Sheet for Healthcare Providers:  GravelBags.it  This test is no t yet approved or cleared by the [place] FDA and  has been authorized for detection and/or diagnosis of SARS-CoV-2 by FDA under an Emergency Use Authorization (EUA). This EUA will remain  in effect (meaning this test can be used) for the duration of the COVID-19 declaration under Section 564(b)(1) of the Act, 21 U.S.C. section 360bbb-3(b)(1), unless the authorization is terminated or revoked sooner.     Influenza A by PCR NEGATIVE NEGATIVE Final   Influenza B by PCR NEGATIVE NEGATIVE Final    Comment: (NOTE) The Xpert Xpress SARS-CoV-2/FLU/RSV assay is intended as an aid in  the diagnosis of influenza from Nasopharyngeal swab specimens and  should not be used as a sole basis for treatment. Nasal washings and  aspirates are unacceptable for Xpert Xpress SARS-CoV-2/FLU/RSV  testing.  Fact Sheet for Patients: PinkCheek.be  Fact Sheet for Healthcare Providers: GravelBags.it  This test is not yet approved or cleared by the [place] FDA and  has been authorized for detection and/or diagnosis of SARS-CoV-2 by  FDA under an Emergency Use Authorization (EUA). This EUA will remain  in effect (meaning this test can be used) for the duration of the  Covid-19 declaration under Section 564(b)(1) of the Act, 21  U.S.C. section 360bbb-3(b)(1), unless the authorization is  terminated or revoked. Performed at [facility], [place] [address] [place], [place] [address]  Radiological Exams on Admission: CT  ABDOMEN PELVIS WO CONTRAST  Result Date: 08/12/2020 CLINICAL DATA:  Epigastric pain which began after eating dinner EXAM: CT ABDOMEN AND PELVIS WITHOUT CONTRAST TECHNIQUE: Multidetector CT imaging of the abdomen and pelvis was performed following the standard protocol without IV contrast. COMPARISON:  None. FINDINGS: Lower chest: Trace bilateral effusions with some adjacent atelectatic change. Lung bases otherwise clear. Cardiomegaly with predominantly right heart enlargement and extensive coronary artery calcifications. Trace pericardial fluid is likely within physiologic normal. Hypoattenuation of the cardiac blood pool compatible with a mild anemia. Hepatobiliary: No visible focal liver lesion on this unenhanced CT. Normal gallbladder and biliary tree. No gallbladder wall thickening, pericholecystic inflammation or visible calcified gallstones. Pancreas: No pancreatic ductal dilatation or surrounding inflammatory changes. Spleen: Normal in size. No concerning splenic lesions. Adrenals/Urinary Tract: Normal adrenal glands. Persistent left hydronephrosis and ureterectasis to the level of the pelvis in the vicinity of the extensive inflammatory change seen in the left lower quadrant suggestive of a possible inflammatory stricture. No right urinary tract dilatation is seen. No focal renal lesions are evident. Mild bilateral perinephric stranding is similar to prior and nonspecific given age and diminished renal function as well as inflammatory changes in pelvis. The urinary bladder remains circumferentially though asymmetrically thickened with loss of discernible fat plane between the left superolateral bladder dome and adjacent: And a small tract of radiolucent gas compatible with colovesicular fistula. Anti dependently layering gas is again present within the bladder lumen. Margins of the posterolateral bladder also difficult to discern from the adjacent left adnexal structures as well. Stomach/Bowel: Large  fluid-filled hiatal hernia, increasing distension from comparison. Some mild thickening and stranding is noted in the lower mediastinum. No free mediastinal air or fluid. Distal stomach is more unremarkable. No focal small bowel thickening or dilatation. Diffuse pancolonic diverticulosis is seen. There is redemonstration of a focal segmental thickening of the proximal to mid sigmoid colon with surrounding inflammatory phlegmon and colovesicular fistulization as detailed above. Additionally, there is persistent loss of discernible fat plane between this phlegmonous region and the multilobulated cystic structure in the left adnexa concerning for coloovarian fistula and concomitant tubo-ovarian abscess. This collection measures approximately 4.6 x 6.1, not significantly changed in size when measured at similar levels to comparison. No new fluid collections or abscesses are seen. Vascular/Lymphatic: Atherosclerotic calcifications within the abdominal aorta and branch vessels. No aneurysm or ectasia. No enlarged abdominopelvic lymph nodes. Reactive adenopathy in the deep pelvis. Reproductive: Anteverted uterus deviated slightly rightward. Inflammatory changes of the left adnexa with possible colo ovarian fistula/tubo-ovarian abscess as described above. The right ovary is less well visualized given several adjacent collapsed small bowel loops but appears relatively quiescent. Other: Inflammatory changes and phlegmon centered in the left lower quadrant with trace amount of reactive free fluid in the deep pelvis. Mild body wall edema is present as well. Fat containing umbilical and bilateral inguinal hernias. Some focal skin thickening and soft tissue stranding superficial to the sacrum. Musculoskeletal: The osseous structures appear diffusely demineralized which may limit detection of small or nondisplaced fractures. No acute osseous abnormality or suspicious osseous lesion. Multilevel degenerative changes are present in  the imaged portions of the spine. Levocurvature of the thoracolumbar spine, apex L2. Chronic anterior wedging at L2 may be degenerative or physiologic in nature. Additional degenerative changes in the hips and pelvis. IMPRESSION: 1. Redemonstration of a focal segmental thickening of the proximal to mid sigmoid colon with surrounding inflammatory phlegmon compatible with a chronic diverticulitis with  demonstrable colovesicular fistula. Contiguous with the inflammation is a stable multilobulated cystic structure in the left adnexa concerning for coloovarian fistula and concomitant tubo-ovarian abscess as well. 2. More circumferential thickening of the bladder wall likely reflecting a concomitant chronic cystitis. 3. Persistent left hydronephrosis and ureterectasis to the level of the pelvis in the vicinity of the extensive inflammatory change seen in the left lower quadrant suggestive of a possible inflammatory stricture. 4. Large fluid-filled hiatal hernia, increasing distension from comparison. Some mild thickening and stranding is noted in the lower mediastinum. Could reflect inflammation of the hiatal hernia and or esophagitis. Recommend correlation with clinical features given the acute presentation. Consider direct visualization as clinically warranted. 5. Trace bilateral effusions with some adjacent atelectatic change. 6. Cardiomegaly with predominantly right heart enlargement and extensive coronary artery calcifications. Some features of mild anasarca as well with body wall edema. 7. Focal skin thickening superficial to the sacrum concerning for developing sacral decubitus ulceration. Recommend correlation with direct visualization. No subjacent evidence of osteomyelitis. 8. Hypoattenuation of the cardiac blood pool compatible with a mild anemia. 9. Aortic Atherosclerosis (ICD10-I70.0). Electronically Signed   By: Lovena Le M.D.   On: 08/12/2020 23:42   DG Chest 2 View  Result Date: 08/12/2020 CLINICAL  DATA:  Epigastric pain EXAM: CHEST - 2 VIEW COMPARISON:  04/21/2020 FINDINGS: Stable cardiomediastinal contours. Moderate-sized hiatal hernia. Atherosclerotic calcification of the aortic knob. No focal airspace consolidation, pleural effusion, or pneumothorax. Exaggerated thoracic kyphosis. IMPRESSION: No active cardiopulmonary disease. Electronically Signed   By: Davina Poke D.O.   On: 08/12/2020 21:56     Assessment/Plan Principal Problem:   Abdominal pain Active Problems:   Aortic stenosis   Hypertension   Persistent atrial fibrillation (HCC)   Benign essential hypertension   Chronic combined systolic and diastolic congestive heart failure ([facility])    1. Severe epigastric pain with CT scan showing inflammation around the hiatal hernia with increased fluid levels for now we will keep patient n.p.o. IV Protonix consult GI.  We will send messages to [facility] since patient was seen by them previously. 2. History of paroxysmal atrial fibrillation presently I am placing patient on IV metoprolol since patient is n.p.o.  Not on anticoagulation. 3. Hypertension in addition to IV metoprolol we will keep patient as needed IV hydralazine.  4. History of chronic systolic and diastolic CHF and aortic stenosis.  CT scan does show some signs of fluid overload but clinically patient has not.  We will continue to observe. 5. Chronic anemia follow CBC. 6. Recently admitted for complicated UTI with chronic findings in the abdomen which appears to be stable except for the findings around the esophagus.  For now I am keeping patient on meropenem.  Since patient have severe abdominal pain with ongoing nausea will need close monitoring for any further worsening in inpatient status.   DVT prophylaxis: SCDs for now.  Will avoid anticoagulation in anticipation of procedure. Code Status: Full code. Family Communication: Discussed with patient. Disposition Plan: Home. Consults called: We will  send a message for GI on-call. Admission status: Inpatient.   Rise Patience MD [facility] Pager [phone].  If 7PM-7AM, please contact night-coverage [email or web address] Password [email or web address]  08/13/2020, 2:23 AM

## 2020-08-13 NOTE — Progress Notes (Signed)
Pharmacy Antibiotic Note  [name] is a 86 y.o. female admitted on 08/12/2020 with intra-abdominal infection.  Pharmacy has been consulted for Meropenem dosing.  Plan: Meropenem 500mg  IV q12h Follow renal function  Height: 4\' 11"  (149.9 cm) Weight: 50.9 kg (112 lb 3.4 oz) IBW/kg (Calculated) : 43.2  Temp (24hrs), Avg:98.1 F (36.7 C), Min:98.1 F (36.7 C), Max:98.1 F (36.7 C)  Recent Labs  Lab 08/06/20 0517 08/07/20 0618 08/12/20 2133  WBC 8.1 9.2 13.9*  CREATININE 1.15* 1.05* 1.08*  LATICACIDVEN  --   --  1.8    Estimated Creatinine Clearance: 23.1 mL/min (A) (by C-G formula based on SCr of 1.08 mg/dL (H)).    Allergies  Allergen Reactions  . Infliximab Other (See Comments)    REACTION: "Enlarged intestines and hernia. Also pushed intestines on lower part of lungs." REACTION: "Enlarged intestines and hernia. Also pushed intestines on lower part of lungs."  . Aspirin Nausea And Vomiting    Low Dose is ok Stomach problems  . Carvedilol Other (See Comments)    Fatigue/ ill Fatigue/ ill  . Codeine Nausea And Vomiting    "Deathly Sick"  . Contrast Media [Iodinated Diagnostic Agents] Other (See Comments)    All over body pain  . Penicillins Rash    Has patient had a PCN reaction causing immediate rash, facial/tongue/throat swelling, SOB or lightheadedness with hypotension: Y Has patient had a PCN reaction causing severe rash involving mucus membranes or skin necrosis: Y Has patient had a PCN reaction that required hospitalization: N Has patient had a PCN reaction occurring within the last 10 years: N If all of the above answers are "NO", then may proceed with Cephalosporin use.     Antimicrobials this admission: 10/15 meropenem >>     Dose adjustments this admission:    Microbiology results:  Thank you for allowing pharmacy to be a part of this patient's care.  Everette Rank, PharmD 08/13/2020 4:30 AM

## 2020-08-13 NOTE — Plan of Care (Addendum)
0430- Admitted from ED, aox4 not in distress, vomiting and abdominal pain noted, placed on telemetry afib rate controlled, oriented to the use of call and informed on high fall risk plan, refused to wear yellow socks. IV fluids started as ordered 0510- Informed hospitalist on call to order prn meds for nausea and pain, pt has prn morphine but refused due to episodes of drowsiness at the ED.  0615- Ordered fentanyl IV but refused, verbalized that ab pain is minimal but more of being nauseated.  Problem: Education: Goal: Knowledge of General Education information will improve Description: Including pain rating scale, medication(s)/side effects and non-pharmacologic comfort measures Outcome: Progressing   Problem: Health Behavior/Discharge Planning: Goal: Ability to manage health-related needs will improve Outcome: Progressing   Problem: Clinical Measurements: Goal: Ability to maintain clinical measurements within normal limits will improve Outcome: Progressing Goal: Will remain free from infection Outcome: Progressing Goal: Diagnostic test results will improve Outcome: Progressing Goal: Respiratory complications will improve Outcome: Progressing Goal: Cardiovascular complication will be avoided Outcome: Progressing   Problem: Activity: Goal: Risk for activity intolerance will decrease Outcome: Progressing   Problem: Nutrition: Goal: Adequate nutrition will be maintained Outcome: Progressing   Problem: Coping: Goal: Level of anxiety will decrease Outcome: Progressing   Problem: Elimination: Goal: Will not experience complications related to bowel motility Outcome: Progressing Goal: Will not experience complications related to urinary retention Outcome: Progressing   Problem: Pain Managment: Goal: General experience of comfort will improve Outcome: Progressing   Problem: Safety: Goal: Ability to remain free from injury will improve Outcome: Progressing   Problem: Skin  Integrity: Goal: Risk for impaired skin integrity will decrease Outcome: Progressing

## 2020-08-13 NOTE — Consult Note (Addendum)
Consultation  Referring Provider: [facility]/ [place] Primary Care Physician:  [name], NP Primary Gastroenterologist:  Dr.Pyrtle 2016  Reason for Consultation:  Epigastric pain  HPI: [name] is a 86 y.o. female, known remotely to Dr. Hilarie Fredrickson, who we are asked to see for an episode of acute epigastric/subxiphoid pain which occurred last night after eating dinner.  Symptoms have resolved and she feels fine this morning. Patient has history of congestive heart failure, significant aortic stenosis, atrial fibrillation not on anticoagulation.  She is O2 dependent at home. She has had a complicated situation which she said had onset about 3 years ago with a colovesicular fistula, and recurrent urinary tract infections which have required admissions with intermittent urinary sepsis.  She had admission in August 2021 and again about a week ago and was just discharged on 08/07/2020.  CT of the abdomen pelvis done without contrast on admission last night showed redemonstration of focal segmental thickening of the proximal mid sigmoid colon with surrounding inflammatory phlegmon compatible with chronic diverticulitis and colovesicular fistula there is also a multilobulated cystic structure in the left adnexa concerning for coloovarian fistula and possible tubo-ovarian abscess.  There is more circumferential thickening of the bladder wall consistent with chronic cystitis.  There is persistent left hydronephrosis and ureterectasis to the level of the pelvis where there is extensive inflammatory changes in the left lower quadrant suggestive of possible inflammatory stricture. Large fluid-filled hiatal hernia, increasing distention from comparison some mild thickening and stranding in the lower mediastinum which could reflect esophagitis.  Patient has been followed by urology/Dr.Wrenn, and prior notes reviewed.  Patient has declined any intervention or surgery for this process.  Patient says  she feels fine today other than her ongoing lower abdominal pain which has been chronic.  She denies any issues with ongoing heartburn or indigestion.  She says she has an episode similar to what happened last night once every 3 to 4 months, sometimes this requires self-induced vomiting for alleviation of symptoms and other times eventually the food will go on down and she feels fine. She says that she knows if she eats too much it is more likely to happen.  She had eaten dinner last night and then tried to eat some cake when she had onset of symptoms which did not resolve over a few hours and therefore came to the emergency room.  She says this is very uncomfortable, but has completely resolved. She attributes these symptoms to being part of the process going on in her pelvis which she thinks resulted from being given Remicade several years ago.  Labs on admit showed WBC of 14.6 hemoglobin 8.8/hematocrit of 29.4 troponin negative, LFTs within normal limits. Blood cultures and urine cultures have been ordered.   Past Medical History:  Diagnosis Date  . A-fib ([place])   . Acute CHF (congestive heart failure) ([place]) 09/02/2019  . Aortic stenosis, moderate    last echo in 2010; AVA 1.1; mild AS per echo in June 2013  . Edema of both legs    s/p laser treatment per Dr. Donnetta Hutching  . Hiatal hernia   . History of chicken pox   . HLD (hyperlipidemia)   . HTN (hypertension)   . Hypothyroidism   . IBS (irritable bowel syndrome)   . Iron deficiency anemia   . Neuropathic pain 09/06/2015  . Rheumatoid arthritis [facility])     Past Surgical History:  Procedure Laterality Date  . APPENDECTOMY  1955  . CARPAL TUNNEL  RELEASE Right   . CATARACT EXTRACTION    . ENDOSCOPIC VEIN LASER TREATMENT    . ENDOVENOUS ABLATION SAPHENOUS VEIN W/ LASER  08-29-2012   left greater saphenous vein   Sherren Mocha Early MD  . ENDOVENOUS ABLATION SAPHENOUS VEIN W/ LASER  09-19-2012   right greater saphenous vein by Curt Jews MD  . EYE  SURGERY  2005   Bilateral cataract  . FOOT SURGERY  2003   bilateral Hammer Toe  . stab phlebectomy Right 01-02-2013   10-15 incisions right thigh and calf by Curt Jews MD    Prior to Admission medications   Medication Sig Start Date End Date Taking? Authorizing Provider  acetaminophen (TYLENOL) 500 MG tablet Take 1,000 mg by mouth every 8 (eight) hours as needed for moderate pain (joint pain).    Yes [provider]  ALPRAZolam (XANAX) 0.25 MG tablet Take 1 tablet (0.25 mg total) by mouth at bedtime as needed for anxiety. FOR ANXIETY/RESTLESS LEGS Patient taking differently: Take 0.25 mg by mouth at bedtime as needed for anxiety.  05/20/20  Yes Nche, Charlene Brooke, NP  aspirin 81 MG chewable tablet Chew 1 tablet (81 mg total) by mouth daily. 10/26/18  Yes Hennie Duos, MD  cephALEXin (KEFLEX) 500 MG capsule Take 1 capsule (500 mg total) by mouth every 12 (twelve) hours for 7 days. 08/07/20 08/14/20 Yes Donne Hazel, MD  fosfomycin (MONUROL) 3 g PACK Take 3 g by mouth once. Repeat in 10 days if needed. 07/26/20  Yes [provider]  gabapentin (NEURONTIN) 100 MG capsule Take 100 mg by mouth daily.    Yes [provider]  HYDROcodone-acetaminophen (NORCO/VICODIN) 5-325 MG tablet Take 1 tablet by mouth every 6 (six) hours as needed for moderate pain.  07/30/20  Yes [provider]  levothyroxine (SYNTHROID) 75 MCG tablet TAKE 1 TABLET BY MOUTH ONCE DAILY BEFORE BREAKFAST Patient taking differently: Take 75 mcg by mouth daily before breakfast.  04/23/20  Yes Nche, Charlene Brooke, NP  metoprolol succinate (TOPROL-XL) 25 MG 24 hr tablet Take 75 mg by mouth daily. 06/13/20  Yes [provider]  Olopatadine HCl 0.2 % SOLN Place 1 drop into both eyes daily. Patient taking differently: Place 1 drop into both eyes in the morning, at noon, in the evening, and at bedtime.  02/25/20  Yes Lassen, Arlo C, PA-C  potassium chloride SA (KLOR-CON) 20 MEQ tablet Take  1 tablet (20 mEq total) by mouth daily as needed. Patient taking differently: Take 20 mEq by mouth daily.  05/20/20  Yes Nche, Charlene Brooke, NP  Pyridoxine HCl (VITAMIN B-6 PO) Take 1 tablet by mouth daily.   Yes [provider]  furosemide (LASIX) 20 MG tablet Take 1 tablet (20 mg total) by mouth daily as needed. Patient not taking: Reported on 08/12/2020 05/20/20   Nche, Charlene Brooke, NP  Multiple Vitamin (MULTIVITAMIN WITH MINERALS) TABS tablet Take 1 tablet by mouth daily. Patient not taking: Reported on 08/04/2020 09/27/18   Raiford Noble Latif, DO  phenazopyridine (PYRIDIUM) 100 MG tablet Take 1 tablet (100 mg total) by mouth 3 (three) times daily with meals. Patient not taking: Reported on 08/04/2020 07/15/20   Nche, Charlene Brooke, NP    Current Facility-Administered Medications  Medication Dose Route Frequency Provider Last Rate Last Admin  . dextrose 5 %-0.9 % sodium chloride infusion   Intravenous Continuous Eugenie Filler, MD      . fentaNYL (SUBLIMAZE) injection 25 mcg  25 mcg Intravenous Once  Lang Snow, FNP      . levothyroxine (SYNTHROID, LEVOTHROID) injection 37.5 mcg  37.5 mcg Intravenous Daily Rise Patience, MD      . meropenem [facility]) 500 mg in sodium chloride 0.9 % 100 mL IVPB  500 mg Intravenous Q12H Poindexter, Leann T, RPH 200 mL/hr at 08/13/20 0547 500 mg at 08/13/20 0547  . metoprolol tartrate (LOPRESSOR) injection 2.5 mg  2.5 mg Intravenous Q6H Rise Patience, MD   2.5 mg at 08/13/20 0418  . morphine 2 MG/ML injection 1 mg  1 mg Intravenous Q4H PRN Rise Patience, MD      . ondansetron [facility]) injection 4 mg  4 mg Intravenous Q8H PRN Lang Snow, FNP   4 mg at 08/13/20 0546  . pantoprazole (PROTONIX) injection 40 mg  40 mg Intravenous Q12H Rise Patience, MD   40 mg at 08/13/20 8182    Allergies as of 08/12/2020 - Review Complete 08/12/2020  Allergen Reaction Noted  . Infliximab Other (See Comments) 06/22/2015  . Aspirin  Nausea And Vomiting 02/17/2011  . Carvedilol Other (See Comments) 05/08/2012  . Codeine Nausea And Vomiting 02/17/2011  . Contrast media [iodinated diagnostic agents] Other (See Comments) 08/04/2020  . Penicillins Rash 02/17/2011    Family History  Problem Relation Age of Onset  . Heart disease Mother   . Peripheral vascular disease Mother   . Heart disease Father   . Peripheral vascular disease Father        Right leg amputation  . COPD Father   . Heart disease Brother 81       Heart Disease before age 84  . Heart attack Brother   . Peripheral vascular disease Other   . Hypertension Sister     Social History   Socioeconomic History  . Marital status: Widowed    Spouse name: Not on file  . Number of children: 2  . Years of education: Not on file  . Highest education level: Not on file  Occupational History  . Occupation: retired  Tobacco Use  . Smoking status: Former Smoker    Types: Cigarettes    Quit date: 10/31/1983    Years since quitting: 36.8  . Smokeless tobacco: Never Used  Vaping Use  . Vaping Use: Never used  Substance and Sexual Activity  . Alcohol use: No  . Drug use: No  . Sexual activity: Never  Other Topics Concern  . Not on file  Social History Narrative   Son lives with patient.   Social Determinants of Health   Financial Resource Strain:   . Difficulty of Paying Living Expenses: Not on file  Food Insecurity:   . Worried About Charity fundraiser in the Last Year: Not on file  . Ran Out of Food in the Last Year: Not on file  Transportation Needs:   . Lack of Transportation (Medical): Not on file  . Lack of Transportation (Non-Medical): Not on file  Physical Activity:   . Days of Exercise per Week: Not on file  . Minutes of Exercise per Session: Not on file  Stress:   . Feeling of Stress : Not on file  Social Connections:   . Frequency of Communication with Friends and Family: Not on file  . Frequency of Social Gatherings with Friends  and Family: Not on file  . Attends Religious Services: Not on file  . Active Member of Clubs or Organizations: Not on file  . Attends Archivist Meetings: Not  on file  . Marital Status: Not on file  Intimate Partner Violence:   . Fear of Current or Ex-Partner: Not on file  . Emotionally Abused: Not on file  . Physically Abused: Not on file  . Sexually Abused: Not on file    Pertinent positive and negative review of systems were noted in the above HPI section.  All other review of systems was otherwise negative.  Physical Exam: Vital signs in last 24 hours: Temp:  [98 F (36.7 C)-98.5 F (36.9 C)] 98 F (36.7 C) (10/15 0825) Pulse Rate:  [65-103] 73 (10/15 0825) Resp:  [15-23] 18 (10/15 0825) BP: (140-181)/(67-118) 140/67 (10/15 0825) SpO2:  [95 %-99 %] 99 % (10/15 0436) Weight:  [50.9 kg] 50.9 kg (10/15 0436) Last BM Date: 08/08/20 General:   Alert,  Well-developed, well-nourished very elderly WF, pleasant and cooperative in NAD Head:  Normocephalic and atraumatic. Eyes:  Sclera clear, no icterus.   Conjunctiva pink. Ears:  Normal auditory acuity. Nose:  No deformity, discharge,  or lesions. Mouth:  No deformity or lesions.   Neck:  Supple; no masses or thyromegaly. Lungs:  Clear throughout to auscultation.   No wheezes, crackles, or rhonchi. Heart:  Regular rate and rhythm; syst murmur Abdomen:  Soft,tender left lower abdomen,and suprapubic BS active,nonpalp mass or hsm.   Rectal:  Deferred  Msk:  Symmetrical without gross deformities. . Pulses:  Normal pulses noted. Extremities:  Without clubbing or edema. Neurologic:  Alert and  oriented x4;  grossly normal neurologically. Skin:  Intact without significant lesions or rashes.. Psych:  Alert and cooperative. Normal mood and affect.  Intake/Output from previous day: No intake/output data recorded. Intake/Output this shift: No intake/output data recorded.  Lab Results: Recent Labs    08/12/20 2133  08/13/20 0519  WBC 13.9* 14.6*  HGB 9.1* 8.8*  HCT 30.6* 29.4*  PLT 338 309   BMET Recent Labs    08/12/20 2133 08/13/20 0519  NA 138 139  K 4.2 4.6  CL 106 104  CO2 23 24  GLUCOSE 141* 176*  BUN 27* 25*  CREATININE 1.08* 1.02*  CALCIUM 8.9 8.8*   LFT Recent Labs    08/13/20 0519  PROT 7.5  ALBUMIN 3.5  AST 38  ALT 22  ALKPHOS 85  BILITOT 0.7  BILIDIR 0.1  IBILI 0.6   PT/INR No results for input(s): LABPROT, INR in the last 72 hours. Hepatitis Panel No results for input(s): HEPBSAG, HCVAB, HEPAIGM, HEPBIGM in the last 72 hours.    IMPRESSION:   # 68  87 year old female admitted last evening through the emergency room after an episode of acute subxiphoid and epigastric pain which occurred after eating dinner. Patient relates having previous similar episodes which occur once every 3 to 4 months and only if she eats too fast or too much.  These are generally relieved by regurgitation and time however the episode last evening lasted for a few hours without resolution. What she is describing is consistent with transient episodes of food impaction/lodging.  She has a large hiatal hernia on imaging, suspect distal esophageal stricture or dysmotility. She likely also has component of esophagitis by CT.  Symptoms have completely resolved.  #2 Complex chronic pelvic inflammatory process dating back to 2019, with involvement of the colon, bladder and left ovary and ureter.  She may have fistula between the left ovary and colon/bladder and has had chronic colovesicular fistula over the past few years.  She has recurrent episodes of urinary tract infections  and urosepsis.  #3 atrial fibrillation 4.  Aortic stenosis 5.  Chronic respiratory failure on home O2 6.  Chronic anemia  Plan; full liquid diet, advance to soft diet as tolerates Discussed barium swallow with the patient as initial exam.  She is not sure that she wants to pursue this, as she is not sure that she would  have anything done if she did have a stricture.  Add daily Omeprazole 40 mg po qam.  Regarding her chronic complex pelvic process, it appears she is primarily been managed by urology, and has declined to have any invasive or surgical intervention.Marland Kitchen  Would reinvolve urology.  We will speak with her again later this afternoon and can pursue barium swallow if she is agreeable, otherwise no role for other GI intervention at this time.    Arish Redner PA-C 08/13/2020, 9:39 AM

## 2020-08-13 NOTE — Plan of Care (Signed)

## 2020-08-13 NOTE — Progress Notes (Signed)
I have seen and assessed patient and agree with Dr. Moise Boring assessment and plan. Patient is a very pleasant 86 year old female history of chronic systolic and diastolic CHF, paroxysmal atrial fibrillation not on anticoagulation, hypertension, iron deficiency anemia, chronic complicated diverticulitis with colovesicular fistula, recurrent UTIs due to colovesicular fistula, chronic complex cystic adnexal structure suggestive of coloovarian fistula, recently hospitalized for complicated UTI presenting back to the ED with an episode of emesis and persistent worsening epigastric abdominal pain after swallowing fast per patient. CT abdomen and pelvis done showed chronic features in the abdomen including chronic diverticulitis with chronic colovesicular colovaginal fistula with persistent left hydronephrosis unchanged from prior hospitalization. It is noted per urology note from recent hospitalization that patient is not a surgical candidate nor patient is not interested in any surgical interventions at this time and will more than likely have recurrent UTIs which needed to be treated empirically based on culture results. GI consulted due to patient's symptoms of emesis and epigastric upper abdominal pain. Appreciate GI input and recommendations.  No charge.

## 2020-08-14 DIAGNOSIS — I350 Nonrheumatic aortic (valve) stenosis: Secondary | ICD-10-CM

## 2020-08-14 LAB — CBC WITH DIFFERENTIAL/PLATELET
Abs Immature Granulocytes: 0.03 10*3/uL (ref 0.00–0.07)
Basophils Absolute: 0.1 10*3/uL (ref 0.0–0.1)
Basophils Relative: 1 %
Eosinophils Absolute: 0.3 10*3/uL (ref 0.0–0.5)
Eosinophils Relative: 4 %
HCT: 26.2 % — ABNORMAL LOW (ref 36.0–46.0)
Hemoglobin: 7.7 g/dL — ABNORMAL LOW (ref 12.0–15.0)
Immature Granulocytes: 0 %
Lymphocytes Relative: 16 %
Lymphs Abs: 1.5 10*3/uL (ref 0.7–4.0)
MCH: 24.4 pg — ABNORMAL LOW (ref 26.0–34.0)
MCHC: 29.4 g/dL — ABNORMAL LOW (ref 30.0–36.0)
MCV: 82.9 fL (ref 80.0–100.0)
Monocytes Absolute: 0.9 10*3/uL (ref 0.1–1.0)
Monocytes Relative: 10 %
Neutro Abs: 6.1 10*3/uL (ref 1.7–7.7)
Neutrophils Relative %: 69 %
Platelets: 258 10*3/uL (ref 150–400)
RBC: 3.16 MIL/uL — ABNORMAL LOW (ref 3.87–5.11)
RDW: 19.7 % — ABNORMAL HIGH (ref 11.5–15.5)
WBC: 8.9 10*3/uL (ref 4.0–10.5)
nRBC: 0 % (ref 0.0–0.2)

## 2020-08-14 LAB — BASIC METABOLIC PANEL
Anion gap: 9 (ref 5–15)
BUN: 22 mg/dL (ref 8–23)
CO2: 22 mmol/L (ref 22–32)
Calcium: 8.2 mg/dL — ABNORMAL LOW (ref 8.9–10.3)
Chloride: 108 mmol/L (ref 98–111)
Creatinine, Ser: 0.96 mg/dL (ref 0.44–1.00)
GFR, Estimated: 52 mL/min — ABNORMAL LOW (ref >60)
Glucose, Bld: 88 mg/dL (ref 70–99)
Potassium: 4.7 mmol/L (ref 3.5–5.1)
Sodium: 139 mmol/L (ref 135–145)

## 2020-08-14 LAB — URINE CULTURE: Culture: <10000 — AB

## 2020-08-14 LAB — GLUCOSE, CAPILLARY
Glucose-Capillary: 75 mg/dL (ref 70–99)
Glucose-Capillary: 126 mg/dL — ABNORMAL HIGH (ref 70–99)

## 2020-08-14 LAB — MAGNESIUM: Magnesium: 1.9 mg/dL (ref 1.7–2.4)

## 2020-08-14 MED ORDER — PANTOPRAZOLE SODIUM 40 MG PO TBEC
40.0000 mg | DELAYED_RELEASE_TABLET | Freq: Every day | ORAL | 2 refills | Status: DC
Start: 2020-08-14 — End: 2021-08-15

## 2020-08-14 NOTE — Plan of Care (Signed)

## 2020-08-14 NOTE — Evaluation (Signed)
Physical Therapy Evaluation Patient Details Name: [name] MRN: [id number] DOB: 04-Aug-1927 Today's Date: 08/14/2020   History of Present Illness  Pt is 86 yo female with PMH including CHF, afib not on anticoagulation, HTN, aortic stenosis, hypothyroidism, and anemia.  She was recently admitted for UTI.  Now presents with epigastric pain. CT abdomen and GI consult obtained - pt declining further intervention/testing and GI has signed off as majority of findings were chronic.  Pt reports to therapy today - she just ate too much too quick.  Clinical Impression  Pt admitted with above diagnosis. Pt presenting with mild deficits in mobility, balance, endurance, and safety.  She was able to ambulate in room with min guard without AD.  Her HR increased significantly with activity (max 160 bpm) but returned quickly to 100 bpm - notified RN, pt has hx of afib.  Pt has necessary DME and 24 hr support. Pt currently with functional limitations due to the deficits listed below (see PT Problem List). Pt will benefit from skilled PT to increase their independence and safety with mobility to allow discharge to the venue listed below.       Follow Up Recommendations Home health PT;Supervision for mobility/OOB    Equipment Recommendations   (pt asking about small portable O2 tank)    Recommendations for Other Services       Precautions / Restrictions Precautions Precautions: Fall Precaution Comments: pt denies falls in past 1 year; watch HR      Mobility  Bed Mobility Overal bed mobility: Modified Independent Bed Mobility: Supine to Sit     Supine to sit: Modified independent (Device/Increase time);HOB elevated        Transfers Overall transfer level: Needs assistance Equipment used: None Transfers: Sit to/from Stand Sit to Stand: Supervision         General transfer comment: supervision for safety; performed x 2  Ambulation/Gait Ambulation/Gait assistance: Min guard Gait  Distance (Feet): 20 Feet (20'x2) Assistive device: None (occasional use of furniture) Gait Pattern/deviations: Step-through pattern;Trunk flexed;Decreased stride length Gait velocity: decreased   General Gait Details: Ambulated to/from bathroom with min guard for safety; no LOB; did fatigue easily and declined further ambulation.  Upon sitting PT looked at telemetry box and noted HR up to 150s-160s bpm (with clear signal) but quickly returned to 100 bpm in < 8minute.  Notified RN who reports pt with hx of Afib.  O2 sats were 100% at rest.  After ambulation reading low 80's but with poor pleth.  When obtained good pleth by having pt relax O2 sats back to upper 90's.  Stairs            Wheelchair Mobility    Modified Rankin (Stroke Patients Only)       Balance Overall balance assessment: Needs assistance Sitting-balance support: No upper extremity supported;Feet supported Sitting balance-Leahy Scale: Good     Standing balance support: No upper extremity supported Standing balance-Leahy Scale: Fair                               Pertinent Vitals/Pain Pain Assessment: No/denies pain    Home Living Family/patient expects to be discharged to:: Private residence Living Arrangements: Children Available Help at Discharge: Family;Available 24 hours/day Type of Home: House (condo) Home Access: Level entry     Home Layout: One level Home Equipment: [place] - 2 wheels;Shower seat - built in;Grab bars - tub/shower  Prior Function     Gait / Transfers Assistance Needed: Reports she uses RW to walk out on patio for exercise but just holds furniture if needed in house  ADL's / [place] Needed: Pt was independent with dressing and toielting, had min A with baths.  Son does most of IADLs but pt reports she can make a meal if needed.  Comments: Pt was getting HHPT     Hand Dominance   Dominant Hand: Right    Extremity/Trunk Assessment   Upper  Extremity Assessment Upper Extremity Assessment: Defer to OT evaluation    Lower Extremity Assessment Lower Extremity Assessment: Overall WFL for tasks assessed    Cervical / Trunk Assessment Cervical / Trunk Assessment: Kyphotic  Communication   Communication: No difficulties  Cognition Arousal/Alertness: Awake/alert Behavior During Therapy: WFL for tasks assessed/performed Overall Cognitive Status: Within Functional Limits for tasks assessed                                        General Comments General comments (skin integrity, edema, etc.): Pt asking about smaller portable O2 tank -notified case manager    Exercises     Assessment/Plan    PT Assessment Patient needs continued PT services  PT Problem List Decreased activity tolerance;Decreased balance;Decreased mobility;Decreased safety awareness;Cardiopulmonary status limiting activity;Decreased knowledge of use of DME       PT Treatment Interventions Gait training;Therapeutic exercise;Therapeutic activities;Patient/family education;DME instruction;Balance training    PT Goals (Current goals can be found in the Care Plan section)  Acute Rehab PT Goals Patient Stated Goal: Return home with 24 hr care from son PT Goal Formulation: With patient Time For Goal Achievement: 08/28/20 Potential to Achieve Goals: Good    Frequency Min 3X/week   Barriers to discharge        Co-evaluation               AM-PAC PT "6 Clicks" Mobility  Outcome Measure Help needed turning from your back to your side while in a flat bed without using bedrails?: None Help needed moving from lying on your back to sitting on the side of a flat bed without using bedrails?: None Help needed moving to and from a bed to a chair (including a wheelchair)?: None Help needed standing up from a chair using your arms (e.g., wheelchair or bedside chair)?: None Help needed to walk in hospital room?: A Little Help needed climbing 3-5  steps with a railing? : A Little 6 Click Score: 22    End of Session Equipment Utilized During Treatment: Gait belt Activity Tolerance: Other (comment);Patient limited by fatigue (elevated HR) Patient left: in chair;with call bell/phone within reach;with chair alarm set Nurse Communication: Mobility status PT Visit Diagnosis: Difficulty in walking, not elsewhere classified (R26.2)    Time: 7681-1572 PT Time Calculation (min) (ACUTE ONLY): 28 min   Charges:   PT Evaluation $PT Eval Low Complexity: 1 Low PT Treatments $Therapeutic Activity: 8-22 mins        Abran Richard, PT Acute Rehab Services Pager [phone] [facility] [phone]    Karlton Lemon 08/14/2020, 11:50 AM

## 2020-08-14 NOTE — TOC Progression Note (Signed)
Transition of Care [facility]) - Progression Note    Patient Details  Name: [name] MRN: [id number] Date of Birth: 1926/11/25  Transition of Care [facility] CM/SW Contact  Joaquin Courts, RN Phone Number: 08/14/2020, 2:41 PM  Clinical Narrative:    Alvis Lemmings for [place] and OT.    Expected Discharge Plan: [place] Barriers to Discharge: No Barriers Identified  Expected Discharge Plan and Services Expected Discharge Plan: [place]   Discharge Planning Services: CM Consult Post Acute Care Choice: Resumption of Svcs/PTA Provider Living arrangements for the past 2 months: Single Family Home Expected Discharge Date: 08/14/20                         HH Arranged: PT, OT HH Agency: [place] Date [place]: 08/14/20 Time [place]: 1440 Representative spoke with at [place]: [place] ([place]) Interventions    Readmission Risk Interventions Readmission Risk Prevention Plan 02/16/2020 09/03/2019  Transportation Screening - Complete  PCP or Specialist Appt within 3-5 Days - Complete  HRI or [place] - Complete  Social Work Consult for [place] Planning/Counseling - Complete  Palliative Care Screening - Not Applicable  Medication Review [employer]) - Complete  PCP or Specialist appointment within 3-5 days of discharge Not Complete -  PCP/Specialist Appt Not Complete comments patient will discharge to SNF -  [place] or Home Care Consult Complete -  SW Recovery Care/Counseling Consult Complete -  Palliative Care Screening Not Applicable -  [place] Complete -  Some recent data might be hidden

## 2020-08-14 NOTE — Progress Notes (Addendum)
Patient refused Protonix. Snack provided for low blood sugar this AM.

## 2020-08-14 NOTE — Discharge Summary (Signed)
Physician Discharge Summary  [name] YPP:[id number] DOB: 1927-04-09 DOA: 08/12/2020  PCP: [name], NP  Admit date: 08/12/2020 Discharge date: 08/14/2020  Time spent: 55 minutes  Recommendations for Outpatient Follow-up:  1. Follow-up with Dr. Hilarie Fredrickson, gastroenterology in 2 to 3 weeks. 2. Follow-up with Dr. Jeffie Pollock, urology in 1 week. 3. Follow-up with Nche, Charlene Brooke, NP as scheduled.  On follow-up patient will need a basic metabolic profile done to follow-up on electrolytes and renal function.  Patient will need a CBC done to follow-up on H&H. 4. Follow-up with outpatient palliative care services.   Discharge Diagnoses:  Principal Problem:   Abdominal pain Active Problems:   Aortic stenosis   Hypertension   Persistent atrial fibrillation (HCC)   Benign essential hypertension   Chronic combined systolic and diastolic congestive heart failure (HCC)   Hiatal hernia   Abnormal CT of the abdomen   Discharge Condition: Stable and improved  Diet recommendation: Heart healthy  Filed Weights   08/12/20 2120 08/13/20 0436  Weight: 50.9 kg 50.9 kg    History of present illness:  HPI per Dr. Corinna Capra [name] is a 86 y.o. female with history of chronic systolic and diastolic CHF, paroxysmal atrial fibrillation not on anticoagulation, hypertension, aortic stenosis, hypothyroidism, chronic iron deficiency anemia who was recently admitted for complicated UTI was discharged home after initially being treated on meropenem with Keflex was doing well and had gone to get her head done and later in the evening, 1 day prior to admission, patient ate dinner when patient stated that she may have swallowed fast when she started having severe epigastric pain with some one episode of vomiting.  Denied any blood in the vomitus had had normal bowel movement the day prior to admission.  The pain was worsening and persistent and patient decided to come to the  ER.  ED Course: In the ER patient was afebrile not hypoxic.  CT abdomen pelvis showed chronic features in the abdomen including chronic diverticulitis with chronic colovesicular and coloovarian fistulas with persistent left-sided hydronephrosis.  The CAT scan also showing large fluid-filled hiatal hernia with the inflammation which has increased in size from previous.  Patient was placed on Protonix and admitted for further observation.  Covid test was negative labs show WBC count of 13.9 lactic acid was normal high sensitive troponin was negative.  Hospital Course:  1 severe epigastric pain/vomiting Patient had presented severe epigastric pain and an episode of vomiting.  CT abdomen and pelvis which was done showed inflammation around the ischial hernia with increased fluid levels, chronic features in the abdomen including chronic diverticulitis with chronic colovesicular and colovaginal fistulas with persistent left sided hydronephrosis.  Patient was initially made n.p.o., placed on gentle hydration, GI consulted.  Patient seen in consultation by Dr. Hilarie Fredrickson gastroenterology who felt patient's symptoms likely related to reflux, large hiatal hernia and possibly esophagitis.  Patient was adamant to GI that she did not want any further evaluation for her intermittent chronic symptoms.  Barium esophagram with upper GI series and upper endoscopy were offered however patient was clear she did not wish to pursue either of these tests.  GI subsequently recommended daily PPI which patient was started on.  Patient was placed back on a diet which he tolerated.  Did not have any severe epigastric abdominal pain nor did she have any further emesis.  Patient be discharged home in stable and improved condition and is to follow-up with GI in the outpatient setting.  2.  Paroxysmal atrial fibrillation Patient was maintained on IV metoprolol for rate control on presentation as patient was initially made n.p.o.  Patient  noted not to be on anticoagulation.  Patient will be resumed back on her home regimen of oral Toprol on discharge.  Outpatient follow-up with cardiology.  3.  Hypertension Controlled on IV metoprolol during the hospitalization.  Patient be resumed back on her home oral antihypertensive medications on discharge.  4.  History of chronic systolic diastolic heart failure/aortic stenosis Remained stable.  Patient remained asymptomatic.  Outpatient follow-up.  5.  Recent admission for complicated UTI/chronic findings on CT abdomen Patient noted to recently have been hospitalized for complicated UTI and had been discharged home on Keflex which she was tolerating.  CT abdomen and pelvis which was done did show chronic findings of diverticulitis, chronic inflammatory phlegmon due to diverticulitis, colovesicular fistula, cystic structure in the left adnexa suggestive of coloovarian fistula.  Patient initially placed on IV Merrem subsequently downgraded to IV Rocephin during the hospitalization.  Patient be discharged back home on home regimen of Keflex she was recently discharged home to complete course of treatment.  It was noted during prior hospitalization that patient was seen by urology and is noted that patient is not a surgical candidate nor did patient wish to have any surgery.  To treat each infection based on culture results and patient noted will have a high recurrence rate will continue to have infections to be treated as needed.  Patient will follow up closely in the outpatient setting with urology post discharge as previously recommended during last hospitalization.  Procedures:  CT abdomen and pelvis 08/12/2020  Chest x-ray 08/12/2020    Consultations:  Gastroenterology: Dr. Hilarie Fredrickson 08/13/2020  Discharge Exam: Vitals:   08/14/20 0532 08/14/20 1324  BP: 122/67 118/81  Pulse: 77 97  Resp: 18 17  Temp: 98.5 F (36.9 C) 99 F (37.2 C)  SpO2: 98% 98%    General:  NAD Cardiovascular: Irregularly irregular. Respiratory: CTAB GI: Chronic lower abdominal pain unchanged per patient.  Positive bowel sounds.  No rebound.  No guarding.  Discharge Instructions   Discharge Instructions    Diet - low sodium heart healthy   Complete by: As directed    Increase activity slowly   Complete by: As directed      Allergies as of 08/14/2020      Reactions   Infliximab Other (See Comments)   REACTION: "Enlarged intestines and hernia. Also pushed intestines on lower part of lungs." REACTION: "Enlarged intestines and hernia. Also pushed intestines on lower part of lungs."   Aspirin Nausea And Vomiting   Low Dose is ok Stomach problems   Carvedilol Other (See Comments)   Fatigue/ ill Fatigue/ ill   Codeine Nausea And Vomiting   "Deathly Sick"   Contrast Media [iodinated Diagnostic Agents] Other (See Comments)   All over body pain   Penicillins Rash   Has patient had a PCN reaction causing immediate rash, facial/tongue/throat swelling, SOB or lightheadedness with hypotension: Y Has patient had a PCN reaction causing severe rash involving mucus membranes or skin necrosis: Y Has patient had a PCN reaction that required hospitalization: N Has patient had a PCN reaction occurring within the last 10 years: N If all of the above answers are "NO", then may proceed with Cephalosporin use.      Medication List    STOP taking these medications   multivitamin with minerals Tabs tablet   phenazopyridine 100 MG tablet Commonly  known as: PYRIDIUM     TAKE these medications   acetaminophen 500 MG tablet Commonly known as: TYLENOL Take 1,000 mg by mouth every 8 (eight) hours as needed for moderate pain (joint pain).   ALPRAZolam 0.25 MG tablet Commonly known as: XANAX Take 1 tablet (0.25 mg total) by mouth at bedtime as needed for anxiety. FOR ANXIETY/RESTLESS LEGS What changed: additional instructions   aspirin 81 MG chewable tablet Chew 1 tablet (81 mg  total) by mouth daily.   cephALEXin 500 MG capsule Commonly known as: KEFLEX Take 1 capsule (500 mg total) by mouth every 12 (twelve) hours for 7 days.   fosfomycin 3 g Pack Commonly known as: MONUROL Take 3 g by mouth once. Repeat in 10 days if needed.   furosemide 20 MG tablet Commonly known as: LASIX Take 1 tablet (20 mg total) by mouth daily as needed.   gabapentin 100 MG capsule Commonly known as: NEURONTIN Take 100 mg by mouth daily.   HYDROcodone-acetaminophen 5-325 MG tablet Commonly known as: NORCO/VICODIN Take 1 tablet by mouth every 6 (six) hours as needed for moderate pain.   levothyroxine 75 MCG tablet Commonly known as: SYNTHROID TAKE 1 TABLET BY MOUTH ONCE DAILY BEFORE BREAKFAST What changed:   how much to take  how to take this  when to take this  additional instructions   metoprolol succinate 25 MG 24 hr tablet Commonly known as: TOPROL-XL Take 75 mg by mouth daily.   Olopatadine HCl 0.2 % Soln Place 1 drop into both eyes daily. What changed: when to take this   pantoprazole 40 MG tablet Commonly known as: PROTONIX Take 1 tablet (40 mg total) by mouth daily at 6 (six) AM.   potassium chloride SA 20 MEQ tablet Commonly known as: KLOR-CON Take 1 tablet (20 mEq total) by mouth daily as needed. What changed: when to take this   VITAMIN B-6 PO Take 1 tablet by mouth daily.      Allergies  Allergen Reactions  . Infliximab Other (See Comments)    REACTION: "Enlarged intestines and hernia. Also pushed intestines on lower part of lungs." REACTION: "Enlarged intestines and hernia. Also pushed intestines on lower part of lungs."  . Aspirin Nausea And Vomiting    Low Dose is ok Stomach problems  . Carvedilol Other (See Comments)    Fatigue/ ill Fatigue/ ill  . Codeine Nausea And Vomiting    "Deathly Sick"  . Contrast Media [Iodinated Diagnostic Agents] Other (See Comments)    All over body pain  . Penicillins Rash    Has patient had a PCN  reaction causing immediate rash, facial/tongue/throat swelling, SOB or lightheadedness with hypotension: Y Has patient had a PCN reaction causing severe rash involving mucus membranes or skin necrosis: Y Has patient had a PCN reaction that required hospitalization: N Has patient had a PCN reaction occurring within the last 10 years: N If all of the above answers are "NO", then may proceed with Cephalosporin use.     Follow-up Information    Nche, Charlene Brooke, NP. Schedule an appointment as soon as possible for a visit.   Specialty: Internal Medicine Why: f/u as scheduled Contact information: [place] [address] [phone]        Geralynn Rile, MD .   Specialties: Internal Medicine, Cardiology, Radiology Contact information: [place] [address] [phone]        Irine Seal, MD. Schedule an appointment as soon as possible for  a visit in 1 week(s).   Specialty: Urology Why: f/u in 1 week or as scheduled. Contact information: [place] [address] [phone]        Jerene Bears, MD. Schedule an appointment as soon as possible for a visit in 2 week(s).   Specialty: Gastroenterology Why: f/u in 2-3 weeks. Contact information: [address] [place] [address] [phone]        [facility] Follow up.   Specialty: Home Health Services Why: agency will provide home health physical and occupational therapy. Contact information: [place] [address] [phone]                The results of significant diagnostics from this hospitalization (including imaging, microbiology, ancillary and laboratory) are listed below for reference.    Significant Diagnostic Studies: CT ABDOMEN PELVIS WO CONTRAST  Result Date: 08/12/2020 CLINICAL DATA:  Epigastric pain which began after eating dinner EXAM: CT ABDOMEN AND PELVIS WITHOUT CONTRAST  TECHNIQUE: Multidetector CT imaging of the abdomen and pelvis was performed following the standard protocol without IV contrast. COMPARISON:  None. FINDINGS: Lower chest: Trace bilateral effusions with some adjacent atelectatic change. Lung bases otherwise clear. Cardiomegaly with predominantly right heart enlargement and extensive coronary artery calcifications. Trace pericardial fluid is likely within physiologic normal. Hypoattenuation of the cardiac blood pool compatible with a mild anemia. Hepatobiliary: No visible focal liver lesion on this unenhanced CT. Normal gallbladder and biliary tree. No gallbladder wall thickening, pericholecystic inflammation or visible calcified gallstones. Pancreas: No pancreatic ductal dilatation or surrounding inflammatory changes. Spleen: Normal in size. No concerning splenic lesions. Adrenals/Urinary Tract: Normal adrenal glands. Persistent left hydronephrosis and ureterectasis to the level of the pelvis in the vicinity of the extensive inflammatory change seen in the left lower quadrant suggestive of a possible inflammatory stricture. No right urinary tract dilatation is seen. No focal renal lesions are evident. Mild bilateral perinephric stranding is similar to prior and nonspecific given age and diminished renal function as well as inflammatory changes in pelvis. The urinary bladder remains circumferentially though asymmetrically thickened with loss of discernible fat plane between the left superolateral bladder dome and adjacent: And a small tract of radiolucent gas compatible with colovesicular fistula. Anti dependently layering gas is again present within the bladder lumen. Margins of the posterolateral bladder also difficult to discern from the adjacent left adnexal structures as well. Stomach/Bowel: Large fluid-filled hiatal hernia, increasing distension from comparison. Some mild thickening and stranding is noted in the lower mediastinum. No free mediastinal air or  fluid. Distal stomach is more unremarkable. No focal small bowel thickening or dilatation. Diffuse pancolonic diverticulosis is seen. There is redemonstration of a focal segmental thickening of the proximal to mid sigmoid colon with surrounding inflammatory phlegmon and colovesicular fistulization as detailed above. Additionally, there is persistent loss of discernible fat plane between this phlegmonous region and the multilobulated cystic structure in the left adnexa concerning for coloovarian fistula and concomitant tubo-ovarian abscess. This collection measures approximately 4.6 x 6.1, not significantly changed in size when measured at similar levels to comparison. No new fluid collections or abscesses are seen. Vascular/Lymphatic: Atherosclerotic calcifications within the abdominal aorta and branch vessels. No aneurysm or ectasia. No enlarged abdominopelvic lymph nodes. Reactive adenopathy in the deep pelvis. Reproductive: Anteverted uterus deviated slightly rightward. Inflammatory changes of the left adnexa with possible colo ovarian fistula/tubo-ovarian abscess as described above. The right ovary is less well visualized given several adjacent  collapsed small bowel loops but appears relatively quiescent. Other: Inflammatory changes and phlegmon centered in the left lower quadrant with trace amount of reactive free fluid in the deep pelvis. Mild body wall edema is present as well. Fat containing umbilical and bilateral inguinal hernias. Some focal skin thickening and soft tissue stranding superficial to the sacrum. Musculoskeletal: The osseous structures appear diffusely demineralized which may limit detection of small or nondisplaced fractures. No acute osseous abnormality or suspicious osseous lesion. Multilevel degenerative changes are present in the imaged portions of the spine. Levocurvature of the thoracolumbar spine, apex L2. Chronic anterior wedging at L2 may be degenerative or physiologic in nature.  Additional degenerative changes in the hips and pelvis. IMPRESSION: 1. Redemonstration of a focal segmental thickening of the proximal to mid sigmoid colon with surrounding inflammatory phlegmon compatible with a chronic diverticulitis with demonstrable colovesicular fistula. Contiguous with the inflammation is a stable multilobulated cystic structure in the left adnexa concerning for coloovarian fistula and concomitant tubo-ovarian abscess as well. 2. More circumferential thickening of the bladder wall likely reflecting a concomitant chronic cystitis. 3. Persistent left hydronephrosis and ureterectasis to the level of the pelvis in the vicinity of the extensive inflammatory change seen in the left lower quadrant suggestive of a possible inflammatory stricture. 4. Large fluid-filled hiatal hernia, increasing distension from comparison. Some mild thickening and stranding is noted in the lower mediastinum. Could reflect inflammation of the hiatal hernia and or esophagitis. Recommend correlation with clinical features given the acute presentation. Consider direct visualization as clinically warranted. 5. Trace bilateral effusions with some adjacent atelectatic change. 6. Cardiomegaly with predominantly right heart enlargement and extensive coronary artery calcifications. Some features of mild anasarca as well with body wall edema. 7. Focal skin thickening superficial to the sacrum concerning for developing sacral decubitus ulceration. Recommend correlation with direct visualization. No subjacent evidence of osteomyelitis. 8. Hypoattenuation of the cardiac blood pool compatible with a mild anemia. 9. Aortic Atherosclerosis (ICD10-I70.0). Electronically Signed   By: Lovena Le M.D.   On: 08/12/2020 23:42   CT ABDOMEN PELVIS WO CONTRAST  Result Date: 08/04/2020 CLINICAL DATA:  Lower abdominal pain.  Known colovesical fistula EXAM: CT ABDOMEN AND PELVIS WITHOUT CONTRAST TECHNIQUE: Multidetector CT imaging of the  abdomen and pelvis was performed following the standard protocol without oral or IV contrast. COMPARISON:  CT abdomen and pelvis February 10, 2020 and June 18, 2020; earlier CT October 13, 2018 FINDINGS: Lower chest: No edema or airspace opacity. Large hiatal type hernia is again noted. Foci of coronary artery calcification evident. Hepatobiliary: No focal liver lesions are appreciable on this noncontrast enhanced study. Gallbladder wall does not appear appreciably thickened. There is no appreciable biliary duct dilatation. Pancreas: No a pancreatic mass or inflammatory focus. Spleen: No splenic lesions evident. Adrenals/Urinary Tract: Adrenals bilaterally appear normal. There is stable left hydronephrosis and ureterectasis to the level of the left upper pelvis, similar to prior study. No renal mass on either side. No hydronephrosis on the right. No renal or ureteral calculi evident on either side. The urinary bladder wall remains markedly thickened with air in the urinary bladder. Loss of fat plane between sigmoid colon and urinary bladder, felt to be consistent with colovesical fistula, chronic. The air seen within the urinary bladder has been present previously and is felt to arise secondary to chronic colovesical fistula. Stomach/Bowel: There is extensive wall thickening throughout the sigmoid colon region, likely with chronic diverticulitis. Loss of fat plane between the sigmoid colon and bladder felt  to represent chronic colovesical fistula. No new bowel inflammatory change evident. Scattered noninflamed diverticula noted throughout the colon. There is no appreciable bowel obstruction. The terminal ileal region appears normal. There is no pneumoperitoneum or portal venous air evident. Note that rectum is mildly distended with stool and air. No rectal wall thickening. Vascular/Lymphatic: There is no abdominal aortic aneurysm. There is extensive aortic and pelvic arterial vascular calcification as well as  scattered foci of mesenteric arterial vascular calcification. No adenopathy evident in the abdomen or pelvis. Reproductive: Uterus is anteverted and canted to the right. Previously noted complex partially cystic left adnexal mass is again noted measuring 5.8 x 5.5 cm. No new pelvic mass evident. Other: Appendix absent. No abscess or ascites evident in the abdomen or pelvis. Fat noted in each inguinal ring. Musculoskeletal: There is extensive arthropathy throughout the lumbar spine. No blastic or lytic bone lesions are evident. No intramuscular or abdominal wall lesions. IMPRESSION: 1. Chronic wall thickening in the sigmoid colon likely indicative of chronic diverticulitis with apparent colovesical fistula which has been noted previously. Scattered diverticula elsewhere in the colon. No bowel obstruction. No free air or portal venous air. 2. Diffuse thickening of the urinary bladder wall persists with air in the bladder consistent with chronic colovesical fistula. This degree of urinary bladder wall thickening is likely of inflammatory etiology. Underlying urinary bladder neoplasm could present in this manner. 3. Complex left adnexal region mass remains, similar to prior studies. This mass must be considered indeterminate for potential ovarian neoplastic lesion. 4. Aortic Atherosclerosis (ICD10-I70.0). Foci of coronary artery calcification as well as pelvic and mesenteric arterial calcification noted as well. 5.  Large hiatal hernia again noted. 6. Chronic hydronephrosis and ureterectasis to the level of the upper pelvis on the left with probable stricture in the ureter in the upper pelvic region. This apparent stricture not well seen on noncontrast enhanced study. Electronically Signed   By: Lowella Grip III M.D.   On: 08/04/2020 14:03   DG Chest 2 View  Result Date: 08/12/2020 CLINICAL DATA:  Epigastric pain EXAM: CHEST - 2 VIEW COMPARISON:  04/21/2020 FINDINGS: Stable cardiomediastinal contours.  Moderate-sized hiatal hernia. Atherosclerotic calcification of the aortic knob. No focal airspace consolidation, pleural effusion, or pneumothorax. Exaggerated thoracic kyphosis. IMPRESSION: No active cardiopulmonary disease. Electronically Signed   By: Davina Poke D.O.   On: 08/12/2020 21:56    Microbiology: Recent Results (from the past 240 hour(s))  Respiratory Panel by RT PCR (Flu A&B, Covid) - Nasopharyngeal Swab     Status: None   Collection Time: 08/04/20  2:58 PM   Specimen: Nasopharyngeal Swab  Result Value Ref Range Status   SARS Coronavirus 2 by RT PCR NEGATIVE NEGATIVE Final    Comment: (NOTE) SARS-CoV-2 target nucleic acids are NOT DETECTED.  The SARS-CoV-2 RNA is generally detectable in upper respiratoy specimens during the acute phase of infection. The lowest concentration of SARS-CoV-2 viral copies this assay can detect is 131 copies/mL. A negative result does not preclude SARS-Cov-2 infection and should not be used as the sole basis for treatment or other patient management decisions. A negative result may occur with  improper specimen collection/handling, submission of specimen other than nasopharyngeal swab, presence of viral mutation(s) within the areas targeted by this assay, and inadequate number of viral copies (<131 copies/mL). A negative result must be combined with clinical observations, patient history, and epidemiological information. The expected result is Negative.  Fact Sheet for Patients:  PinkCheek.be  Fact Sheet for Healthcare Providers:  GravelBags.it  This test is no t yet approved or cleared by the [place] and  has been authorized for detection and/or diagnosis of SARS-CoV-2 by FDA under an Emergency Use Authorization (EUA). This EUA will remain  in effect (meaning this test can be used) for the duration of the COVID-19 declaration under Section 564(b)(1) of the Act, 21  U.S.C. section 360bbb-3(b)(1), unless the authorization is terminated or revoked sooner.     Influenza A by PCR NEGATIVE NEGATIVE Final   Influenza B by PCR NEGATIVE NEGATIVE Final    Comment: (NOTE) The Xpert Xpress SARS-CoV-2/FLU/RSV assay is intended as an aid in  the diagnosis of influenza from Nasopharyngeal swab specimens and  should not be used as a sole basis for treatment. Nasal washings and  aspirates are unacceptable for Xpert Xpress SARS-CoV-2/FLU/RSV  testing.  Fact Sheet for Patients: PinkCheek.be  Fact Sheet for Healthcare Providers: GravelBags.it  This test is not yet approved or cleared by the [place] FDA and  has been authorized for detection and/or diagnosis of SARS-CoV-2 by  FDA under an Emergency Use Authorization (EUA). This EUA will remain  in effect (meaning this test can be used) for the duration of the  Covid-19 declaration under Section 564(b)(1) of the Act, 21  U.S.C. section 360bbb-3(b)(1), unless the authorization is  terminated or revoked. Performed at [facility], [place] [address], [place], [place] [address]   Respiratory Panel by RT PCR (Flu A&B, Covid) - Nasopharyngeal Swab     Status: None   Collection Time: 08/13/20 12:12 AM   Specimen: Nasopharyngeal Swab  Result Value Ref Range Status   SARS Coronavirus 2 by RT PCR NEGATIVE NEGATIVE Final    Comment: (NOTE) SARS-CoV-2 target nucleic acids are NOT DETECTED.  The SARS-CoV-2 RNA is generally detectable in upper respiratoy specimens during the acute phase of infection. The lowest concentration of SARS-CoV-2 viral copies this assay can detect is 131 copies/mL. A negative result does not preclude SARS-Cov-2 infection and should not be used as the sole basis for treatment or other patient management decisions. A negative result may occur with  improper specimen collection/handling, submission of specimen  other than nasopharyngeal swab, presence of viral mutation(s) within the areas targeted by this assay, and inadequate number of viral copies (<131 copies/mL). A negative result must be combined with clinical observations, patient history, and epidemiological information. The expected result is Negative.  Fact Sheet for Patients:  PinkCheek.be  Fact Sheet for Healthcare Providers:  GravelBags.it  This test is no t yet approved or cleared by the [place] FDA and  has been authorized for detection and/or diagnosis of SARS-CoV-2 by FDA under an Emergency Use Authorization (EUA). This EUA will remain  in effect (meaning this test can be used) for the duration of the COVID-19 declaration under Section 564(b)(1) of the Act, 21 U.S.C. section 360bbb-3(b)(1), unless the authorization is terminated or revoked sooner.     Influenza A by PCR NEGATIVE NEGATIVE Final   Influenza B by PCR NEGATIVE NEGATIVE Final    Comment: (NOTE) The Xpert Xpress SARS-CoV-2/FLU/RSV assay is intended as an aid in  the diagnosis of influenza from Nasopharyngeal swab specimens and  should not be used as a sole basis for treatment. Nasal washings and  aspirates are unacceptable for Xpert Xpress SARS-CoV-2/FLU/RSV  testing.  Fact Sheet for Patients: PinkCheek.be  Fact Sheet for Healthcare Providers: GravelBags.it  This test is not yet approved or cleared by the [place] and  has been authorized for detection and/or diagnosis of SARS-CoV-2 by  FDA under an Emergency Use Authorization (EUA). This EUA will remain  in effect (meaning this test can be used) for the duration of the  Covid-19 declaration under Section 564(b)(1) of the Act, 21  U.S.C. section 360bbb-3(b)(1), unless the authorization is  terminated or revoked. Performed at [facility], [place] [address] [place], [place] [address]   Culture, blood (Routine X 2) w Reflex to ID Panel     Status: None (Preliminary result)   Collection Time: 08/13/20  8:57 AM   Specimen: BLOOD LEFT HAND  Result Value Ref Range Status   Specimen Description   Final    BLOOD LEFT HAND Performed at [facility] [address], [place], [place] [address]    Special Requests   Final    BOTTLES DRAWN AEROBIC AND ANAEROBIC Blood Culture adequate volume Performed at [facility] [address] [place], [place] [address]    Culture   Final    NO GROWTH < 24 HOURS Performed at [facility] [address] [place], [place] [address]    Report Status PENDING  Incomplete  Culture, blood (Routine X 2) w Reflex to ID Panel     Status: None (Preliminary result)   Collection Time: 08/13/20  9:02 AM   Specimen: BLOOD  Result Value Ref Range Status   Specimen Description   Final    BLOOD RIGHT ANTECUBITAL Performed at [facility] [address], [place], [place] [address]    Special Requests   Final    BOTTLES DRAWN AEROBIC AND ANAEROBIC Blood Culture adequate volume Performed at [facility] [address], [place], [place] [address]    Culture   Final    NO GROWTH < 24 HOURS Performed at [facility] [address] [place],  [address]    Report Status PENDING  Incomplete     Labs: Basic Metabolic Panel: Recent Labs  Lab 08/12/20 2133 08/13/20 0519 08/14/20 0546  NA 138 139 139  K 4.2 4.6 4.7  CL 106 104 108  CO2 23 24 22   GLUCOSE 141* 176* 88  BUN 27* 25* 22  CREATININE 1.08* 1.02* 0.96  CALCIUM 8.9 8.8* 8.2*  MG  --   --  1.9   Liver Function Tests: Recent Labs  Lab 08/12/20 2133 08/13/20 0519  AST 38 38  ALT 22 22  ALKPHOS 87 85  BILITOT 0.6 0.7  PROT 7.8 7.5  ALBUMIN 3.6 3.5   Recent Labs  Lab 08/12/20 2133  LIPASE 83*   No results for input(s): AMMONIA in the last 168  hours. CBC: Recent Labs  Lab 08/12/20 2133 08/13/20 0519 08/14/20 0546  WBC 13.9* 14.6* 8.9  NEUTROABS 10.7* 13.0* 6.1  HGB 9.1* 8.8* 7.7*  HCT 30.6* 29.4* 26.2*  MCV 82.0 81.2 82.9  PLT 338 309 258   Cardiac Enzymes: No results for input(s): CKTOTAL, CKMB, CKMBINDEX, TROPONINI in the last 168 hours. BNP: BNP (last 3 results) Recent Labs    09/02/19 1727 08/04/20 1458  BNP 1,352.1* 530.4*    ProBNP (last 3 results) No results for input(s): PROBNP in the last 8760 hours.  CBG: Recent Labs  Lab 08/12/20 2325 08/13/20 1159 08/13/20 2032 08/14/20 0601 08/14/20 1205  GLUCAP 123* 116* 94 75 126*       Signed:  Irine Seal MD.  [facility] 08/14/2020, 2:42 PM

## 2020-08-14 NOTE — Progress Notes (Addendum)
Physician Discharge Summary  Lady Wisham OVF:[id number] DOB: 03/26/1927 DOA: 08/12/2020  PCP: [name], NP  Admit date: 08/12/2020 Discharge date: 08/14/2020  Time spent: 55 minutes  Recommendations for Outpatient Follow-up:  1. Follow-up with Dr. Hilarie Fredrickson, gastroenterology in 2 to 3 weeks. 2. Follow-up with Dr. Jeffie Pollock, urology in 1 week. 3. Follow-up with Nche, Charlene Brooke, NP as scheduled.  On follow-up patient will need a basic metabolic profile done to follow-up on electrolytes and renal function.  Patient will need a CBC done to follow-up on H&H. 4. Follow-up with outpatient palliative care services.   Discharge Diagnoses:  Principal Problem:   Abdominal pain Active Problems:   Aortic stenosis   Hypertension   Persistent atrial fibrillation (HCC)   Benign essential hypertension   Chronic combined systolic and diastolic congestive heart failure (HCC)   Hiatal hernia   Abnormal CT of the abdomen   Discharge Condition: Stable and improved  Diet recommendation: Heart healthy  Filed Weights   08/12/20 2120 08/13/20 0436  Weight: 50.9 kg 50.9 kg    History of present illness:  HPI per Dr. Corinna Capra Jazlen Ogarro is a 86 y.o. female with history of chronic systolic and diastolic CHF, paroxysmal atrial fibrillation not on anticoagulation, hypertension, aortic stenosis, hypothyroidism, chronic iron deficiency anemia who was recently admitted for complicated UTI was discharged home after initially being treated on meropenem with Keflex was doing well and had gone to get her head done and later in the evening, 1 day prior to admission, patient ate dinner when patient stated that she may have swallowed fast when she started having severe epigastric pain with some one episode of vomiting.  Denied any blood in the vomitus had had normal bowel movement the day prior to admission.  The pain was worsening and persistent and patient decided to come to the  ER.  ED Course: In the ER patient was afebrile not hypoxic.  CT abdomen pelvis showed chronic features in the abdomen including chronic diverticulitis with chronic colovesicular and coloovarian fistulas with persistent left-sided hydronephrosis.  The CAT scan also showing large fluid-filled hiatal hernia with the inflammation which has increased in size from previous.  Patient was placed on Protonix and admitted for further observation.  Covid test was negative labs show WBC count of 13.9 lactic acid was normal high sensitive troponin was negative.  Hospital Course:  1 severe epigastric pain/vomiting Patient had presented severe epigastric pain and an episode of vomiting.  CT abdomen and pelvis which was done showed inflammation around the ischial hernia with increased fluid levels, chronic features in the abdomen including chronic diverticulitis with chronic colovesicular and colovaginal fistulas with persistent left sided hydronephrosis.  Patient was initially made n.p.o., placed on gentle hydration, GI consulted.  Patient seen in consultation by Dr. Hilarie Fredrickson gastroenterology who felt patient's symptoms likely related to reflux, large hiatal hernia and possibly esophagitis.  Patient was adamant to GI that she did not want any further evaluation for her intermittent chronic symptoms.  Barium esophagram with upper GI series and upper endoscopy were offered however patient was clear she did not wish to pursue either of these tests.  GI subsequently recommended daily PPI which patient was started on.  Patient was placed back on a diet which he tolerated.  Did not have any severe epigastric abdominal pain nor did she have any further emesis.  Patient be discharged home in stable and improved condition and is to follow-up with GI in the outpatient setting.  2.  Paroxysmal atrial fibrillation Patient was maintained on IV metoprolol for rate control on presentation as patient was initially made n.p.o.  Patient  noted not to be on anticoagulation.  Patient will be resumed back on her home regimen of oral Toprol on discharge.  Outpatient follow-up with cardiology.  3.  Hypertension Controlled on IV metoprolol during the hospitalization.  Patient be resumed back on her home oral antihypertensive medications on discharge.  4.  History of chronic systolic diastolic heart failure/aortic stenosis Remained stable.  Patient remained asymptomatic.  Outpatient follow-up.  5.  Recent admission for complicated UTI/chronic findings on CT abdomen Patient noted to recently have been hospitalized for complicated UTI and had been discharged home on Keflex which she was tolerating.  CT abdomen and pelvis which was done did show chronic findings of diverticulitis, chronic inflammatory phlegmon due to diverticulitis, colovesicular fistula, cystic structure in the left adnexa suggestive of coloovarian fistula.  Patient initially placed on IV Merrem subsequently downgraded to IV Rocephin during the hospitalization.  Patient be discharged back home on home regimen of Keflex she was recently discharged home to complete course of treatment.  It was noted during prior hospitalization that patient was seen by urology and is noted that patient is not a surgical candidate nor did patient wish to have any surgery.  To treat each infection based on culture results and patient noted will have a high recurrence rate will continue to have infections to be treated as needed.  Patient will follow up closely in the outpatient setting with urology post discharge as previously recommended during last hospitalization.  Procedures:  CT abdomen and pelvis 08/12/2020  Chest x-ray 08/12/2020    Consultations:  Gastroenterology: Dr. Hilarie Fredrickson 08/13/2020  Discharge Exam: Vitals:   08/14/20 0532 08/14/20 1324  BP: 122/67 118/81  Pulse: 77 97  Resp: 18 17  Temp: 98.5 F (36.9 C) 99 F (37.2 C)  SpO2: 98% 98%    General:  NAD Cardiovascular: Irregularly irregular. Respiratory: CTAB GI: Chronic lower abdominal pain unchanged per patient.  Positive bowel sounds.  No rebound.  No guarding.  Discharge Instructions   Discharge Instructions    Diet - low sodium heart healthy   Complete by: As directed    Increase activity slowly   Complete by: As directed      Allergies as of 08/14/2020      Reactions   Infliximab Other (See Comments)   REACTION: "Enlarged intestines and hernia. Also pushed intestines on lower part of lungs." REACTION: "Enlarged intestines and hernia. Also pushed intestines on lower part of lungs."   Aspirin Nausea And Vomiting   Low Dose is ok Stomach problems   Carvedilol Other (See Comments)   Fatigue/ ill Fatigue/ ill   Codeine Nausea And Vomiting   "Deathly Sick"   Contrast Media [iodinated Diagnostic Agents] Other (See Comments)   All over body pain   Penicillins Rash   Has patient had a PCN reaction causing immediate rash, facial/tongue/throat swelling, SOB or lightheadedness with hypotension: Y Has patient had a PCN reaction causing severe rash involving mucus membranes or skin necrosis: Y Has patient had a PCN reaction that required hospitalization: N Has patient had a PCN reaction occurring within the last 10 years: N If all of the above answers are "NO", then may proceed with Cephalosporin use.      Medication List    STOP taking these medications   multivitamin with minerals Tabs tablet   phenazopyridine 100 MG tablet Commonly  known as: PYRIDIUM     TAKE these medications   acetaminophen 500 MG tablet Commonly known as: TYLENOL Take 1,000 mg by mouth every 8 (eight) hours as needed for moderate pain (joint pain).   ALPRAZolam 0.25 MG tablet Commonly known as: XANAX Take 1 tablet (0.25 mg total) by mouth at bedtime as needed for anxiety. FOR ANXIETY/RESTLESS LEGS What changed: additional instructions   aspirin 81 MG chewable tablet Chew 1 tablet (81 mg  total) by mouth daily.   cephALEXin 500 MG capsule Commonly known as: KEFLEX Take 1 capsule (500 mg total) by mouth every 12 (twelve) hours for 7 days.   fosfomycin 3 g Pack Commonly known as: MONUROL Take 3 g by mouth once. Repeat in 10 days if needed.   furosemide 20 MG tablet Commonly known as: LASIX Take 1 tablet (20 mg total) by mouth daily as needed.   gabapentin 100 MG capsule Commonly known as: NEURONTIN Take 100 mg by mouth daily.   HYDROcodone-acetaminophen 5-325 MG tablet Commonly known as: NORCO/VICODIN Take 1 tablet by mouth every 6 (six) hours as needed for moderate pain.   levothyroxine 75 MCG tablet Commonly known as: SYNTHROID TAKE 1 TABLET BY MOUTH ONCE DAILY BEFORE BREAKFAST What changed:   how much to take  how to take this  when to take this  additional instructions   metoprolol succinate 25 MG 24 hr tablet Commonly known as: TOPROL-XL Take 75 mg by mouth daily.   Olopatadine HCl 0.2 % Soln Place 1 drop into both eyes daily. What changed: when to take this   pantoprazole 40 MG tablet Commonly known as: PROTONIX Take 1 tablet (40 mg total) by mouth daily at 6 (six) AM.   potassium chloride SA 20 MEQ tablet Commonly known as: KLOR-CON Take 1 tablet (20 mEq total) by mouth daily as needed. What changed: when to take this   VITAMIN B-6 PO Take 1 tablet by mouth daily.      Allergies  Allergen Reactions  . Infliximab Other (See Comments)    REACTION: "Enlarged intestines and hernia. Also pushed intestines on lower part of lungs." REACTION: "Enlarged intestines and hernia. Also pushed intestines on lower part of lungs."  . Aspirin Nausea And Vomiting    Low Dose is ok Stomach problems  . Carvedilol Other (See Comments)    Fatigue/ ill Fatigue/ ill  . Codeine Nausea And Vomiting    "Deathly Sick"  . Contrast Media [Iodinated Diagnostic Agents] Other (See Comments)    All over body pain  . Penicillins Rash    Has patient had a PCN  reaction causing immediate rash, facial/tongue/throat swelling, SOB or lightheadedness with hypotension: Y Has patient had a PCN reaction causing severe rash involving mucus membranes or skin necrosis: Y Has patient had a PCN reaction that required hospitalization: N Has patient had a PCN reaction occurring within the last 10 years: N If all of the above answers are "NO", then may proceed with Cephalosporin use.     Follow-up Information    Nche, Charlene Brooke, NP. Schedule an appointment as soon as possible for a visit.   Specialty: Internal Medicine Why: f/u as scheduled Contact information: [place] [address] [phone]        Geralynn Rile, MD .   Specialties: Internal Medicine, Cardiology, Radiology Contact information: [place] [address] [phone]        Irine Seal, MD. Schedule an appointment as soon as possible for  a visit in 1 week(s).   Specialty: Urology Why: f/u in 1 week or as scheduled. Contact information: [place] [address] [phone]        Jerene Bears, MD. Schedule an appointment as soon as possible for a visit in 2 week(s).   Specialty: Gastroenterology Why: f/u in 2-3 weeks. Contact information: [address] [address] [phone]                The results of significant diagnostics from this hospitalization (including imaging, microbiology, ancillary and laboratory) are listed below for reference.    Significant Diagnostic Studies: CT ABDOMEN PELVIS WO CONTRAST  Result Date: 08/12/2020 CLINICAL DATA:  Epigastric pain which began after eating dinner EXAM: CT ABDOMEN AND PELVIS WITHOUT CONTRAST TECHNIQUE: Multidetector CT imaging of the abdomen and pelvis was performed following the standard protocol without IV contrast. COMPARISON:  None. FINDINGS: Lower chest: Trace bilateral effusions with some adjacent atelectatic change. Lung bases  otherwise clear. Cardiomegaly with predominantly right heart enlargement and extensive coronary artery calcifications. Trace pericardial fluid is likely within physiologic normal. Hypoattenuation of the cardiac blood pool compatible with a mild anemia. Hepatobiliary: No visible focal liver lesion on this unenhanced CT. Normal gallbladder and biliary tree. No gallbladder wall thickening, pericholecystic inflammation or visible calcified gallstones. Pancreas: No pancreatic ductal dilatation or surrounding inflammatory changes. Spleen: Normal in size. No concerning splenic lesions. Adrenals/Urinary Tract: Normal adrenal glands. Persistent left hydronephrosis and ureterectasis to the level of the pelvis in the vicinity of the extensive inflammatory change seen in the left lower quadrant suggestive of a possible inflammatory stricture. No right urinary tract dilatation is seen. No focal renal lesions are evident. Mild bilateral perinephric stranding is similar to prior and nonspecific given age and diminished renal function as well as inflammatory changes in pelvis. The urinary bladder remains circumferentially though asymmetrically thickened with loss of discernible fat plane between the left superolateral bladder dome and adjacent: And a small tract of radiolucent gas compatible with colovesicular fistula. Anti dependently layering gas is again present within the bladder lumen. Margins of the posterolateral bladder also difficult to discern from the adjacent left adnexal structures as well. Stomach/Bowel: Large fluid-filled hiatal hernia, increasing distension from comparison. Some mild thickening and stranding is noted in the lower mediastinum. No free mediastinal air or fluid. Distal stomach is more unremarkable. No focal small bowel thickening or dilatation. Diffuse pancolonic diverticulosis is seen. There is redemonstration of a focal segmental thickening of the proximal to mid sigmoid colon with surrounding  inflammatory phlegmon and colovesicular fistulization as detailed above. Additionally, there is persistent loss of discernible fat plane between this phlegmonous region and the multilobulated cystic structure in the left adnexa concerning for coloovarian fistula and concomitant tubo-ovarian abscess. This collection measures approximately 4.6 x 6.1, not significantly changed in size when measured at similar levels to comparison. No new fluid collections or abscesses are seen. Vascular/Lymphatic: Atherosclerotic calcifications within the abdominal aorta and branch vessels. No aneurysm or ectasia. No enlarged abdominopelvic lymph nodes. Reactive adenopathy in the deep pelvis. Reproductive: Anteverted uterus deviated slightly rightward. Inflammatory changes of the left adnexa with possible colo ovarian fistula/tubo-ovarian abscess as described above. The right ovary is less well visualized given several adjacent collapsed small bowel loops but appears relatively quiescent. Other: Inflammatory changes and phlegmon centered in the left lower quadrant with trace amount of reactive free fluid in the deep pelvis. Mild body wall edema is present as well. Fat containing  umbilical and bilateral inguinal hernias. Some focal skin thickening and soft tissue stranding superficial to the sacrum. Musculoskeletal: The osseous structures appear diffusely demineralized which may limit detection of small or nondisplaced fractures. No acute osseous abnormality or suspicious osseous lesion. Multilevel degenerative changes are present in the imaged portions of the spine. Levocurvature of the thoracolumbar spine, apex L2. Chronic anterior wedging at L2 may be degenerative or physiologic in nature. Additional degenerative changes in the hips and pelvis. IMPRESSION: 1. Redemonstration of a focal segmental thickening of the proximal to mid sigmoid colon with surrounding inflammatory phlegmon compatible with a chronic diverticulitis with  demonstrable colovesicular fistula. Contiguous with the inflammation is a stable multilobulated cystic structure in the left adnexa concerning for coloovarian fistula and concomitant tubo-ovarian abscess as well. 2. More circumferential thickening of the bladder wall likely reflecting a concomitant chronic cystitis. 3. Persistent left hydronephrosis and ureterectasis to the level of the pelvis in the vicinity of the extensive inflammatory change seen in the left lower quadrant suggestive of a possible inflammatory stricture. 4. Large fluid-filled hiatal hernia, increasing distension from comparison. Some mild thickening and stranding is noted in the lower mediastinum. Could reflect inflammation of the hiatal hernia and or esophagitis. Recommend correlation with clinical features given the acute presentation. Consider direct visualization as clinically warranted. 5. Trace bilateral effusions with some adjacent atelectatic change. 6. Cardiomegaly with predominantly right heart enlargement and extensive coronary artery calcifications. Some features of mild anasarca as well with body wall edema. 7. Focal skin thickening superficial to the sacrum concerning for developing sacral decubitus ulceration. Recommend correlation with direct visualization. No subjacent evidence of osteomyelitis. 8. Hypoattenuation of the cardiac blood pool compatible with a mild anemia. 9. Aortic Atherosclerosis (ICD10-I70.0). Electronically Signed   By: Lovena Le M.D.   On: 08/12/2020 23:42   CT ABDOMEN PELVIS WO CONTRAST  Result Date: 08/04/2020 CLINICAL DATA:  Lower abdominal pain.  Known colovesical fistula EXAM: CT ABDOMEN AND PELVIS WITHOUT CONTRAST TECHNIQUE: Multidetector CT imaging of the abdomen and pelvis was performed following the standard protocol without oral or IV contrast. COMPARISON:  CT abdomen and pelvis February 10, 2020 and June 18, 2020; earlier CT October 13, 2018 FINDINGS: Lower chest: No edema or airspace  opacity. Large hiatal type hernia is again noted. Foci of coronary artery calcification evident. Hepatobiliary: No focal liver lesions are appreciable on this noncontrast enhanced study. Gallbladder wall does not appear appreciably thickened. There is no appreciable biliary duct dilatation. Pancreas: No a pancreatic mass or inflammatory focus. Spleen: No splenic lesions evident. Adrenals/Urinary Tract: Adrenals bilaterally appear normal. There is stable left hydronephrosis and ureterectasis to the level of the left upper pelvis, similar to prior study. No renal mass on either side. No hydronephrosis on the right. No renal or ureteral calculi evident on either side. The urinary bladder wall remains markedly thickened with air in the urinary bladder. Loss of fat plane between sigmoid colon and urinary bladder, felt to be consistent with colovesical fistula, chronic. The air seen within the urinary bladder has been present previously and is felt to arise secondary to chronic colovesical fistula. Stomach/Bowel: There is extensive wall thickening throughout the sigmoid colon region, likely with chronic diverticulitis. Loss of fat plane between the sigmoid colon and bladder felt to represent chronic colovesical fistula. No new bowel inflammatory change evident. Scattered noninflamed diverticula noted throughout the colon. There is no appreciable bowel obstruction. The terminal ileal region appears normal. There is no pneumoperitoneum or portal venous air evident. Note  that rectum is mildly distended with stool and air. No rectal wall thickening. Vascular/Lymphatic: There is no abdominal aortic aneurysm. There is extensive aortic and pelvic arterial vascular calcification as well as scattered foci of mesenteric arterial vascular calcification. No adenopathy evident in the abdomen or pelvis. Reproductive: Uterus is anteverted and canted to the right. Previously noted complex partially cystic left adnexal mass is again noted  measuring 5.8 x 5.5 cm. No new pelvic mass evident. Other: Appendix absent. No abscess or ascites evident in the abdomen or pelvis. Fat noted in each inguinal ring. Musculoskeletal: There is extensive arthropathy throughout the lumbar spine. No blastic or lytic bone lesions are evident. No intramuscular or abdominal wall lesions. IMPRESSION: 1. Chronic wall thickening in the sigmoid colon likely indicative of chronic diverticulitis with apparent colovesical fistula which has been noted previously. Scattered diverticula elsewhere in the colon. No bowel obstruction. No free air or portal venous air. 2. Diffuse thickening of the urinary bladder wall persists with air in the bladder consistent with chronic colovesical fistula. This degree of urinary bladder wall thickening is likely of inflammatory etiology. Underlying urinary bladder neoplasm could present in this manner. 3. Complex left adnexal region mass remains, similar to prior studies. This mass must be considered indeterminate for potential ovarian neoplastic lesion. 4. Aortic Atherosclerosis (ICD10-I70.0). Foci of coronary artery calcification as well as pelvic and mesenteric arterial calcification noted as well. 5.  Large hiatal hernia again noted. 6. Chronic hydronephrosis and ureterectasis to the level of the upper pelvis on the left with probable stricture in the ureter in the upper pelvic region. This apparent stricture not well seen on noncontrast enhanced study. Electronically Signed   By: Lowella Grip III M.D.   On: 08/04/2020 14:03   DG Chest 2 View  Result Date: 08/12/2020 CLINICAL DATA:  Epigastric pain EXAM: CHEST - 2 VIEW COMPARISON:  04/21/2020 FINDINGS: Stable cardiomediastinal contours. Moderate-sized hiatal hernia. Atherosclerotic calcification of the aortic knob. No focal airspace consolidation, pleural effusion, or pneumothorax. Exaggerated thoracic kyphosis. IMPRESSION: No active cardiopulmonary disease. Electronically Signed   By:  Davina Poke D.O.   On: 08/12/2020 21:56    Microbiology: Recent Results (from the past 240 hour(s))  Respiratory Panel by RT PCR (Flu A&B, Covid) - Nasopharyngeal Swab     Status: None   Collection Time: 08/04/20  2:58 PM   Specimen: Nasopharyngeal Swab  Result Value Ref Range Status   SARS Coronavirus 2 by RT PCR NEGATIVE NEGATIVE Final    Comment: (NOTE) SARS-CoV-2 target nucleic acids are NOT DETECTED.  The SARS-CoV-2 RNA is generally detectable in upper respiratoy specimens during the acute phase of infection. The lowest concentration of SARS-CoV-2 viral copies this assay can detect is 131 copies/mL. A negative result does not preclude SARS-Cov-2 infection and should not be used as the sole basis for treatment or other patient management decisions. A negative result may occur with  improper specimen collection/handling, submission of specimen other than nasopharyngeal swab, presence of viral mutation(s) within the areas targeted by this assay, and inadequate number of viral copies (<131 copies/mL). A negative result must be combined with clinical observations, patient history, and epidemiological information. The expected result is Negative.  Fact Sheet for Patients:  PinkCheek.be  Fact Sheet for Healthcare Providers:  GravelBags.it  This test is no t yet approved or cleared by the [place] FDA and  has been authorized for detection and/or diagnosis of SARS-CoV-2 by FDA under an Emergency Use Authorization (EUA). This EUA will remain  in effect (meaning this test can be used) for the duration of the COVID-19 declaration under Section 564(b)(1) of the Act, 21 U.S.C. section 360bbb-3(b)(1), unless the authorization is terminated or revoked sooner.     Influenza A by PCR NEGATIVE NEGATIVE Final   Influenza B by PCR NEGATIVE NEGATIVE Final    Comment: (NOTE) The Xpert Xpress SARS-CoV-2/FLU/RSV assay is  intended as an aid in  the diagnosis of influenza from Nasopharyngeal swab specimens and  should not be used as a sole basis for treatment. Nasal washings and  aspirates are unacceptable for Xpert Xpress SARS-CoV-2/FLU/RSV  testing.  Fact Sheet for Patients: PinkCheek.be  Fact Sheet for Healthcare Providers: GravelBags.it  This test is not yet approved or cleared by the [place] FDA and  has been authorized for detection and/or diagnosis of SARS-CoV-2 by  FDA under an Emergency Use Authorization (EUA). This EUA will remain  in effect (meaning this test can be used) for the duration of the  Covid-19 declaration under Section 564(b)(1) of the Act, 21  U.S.C. section 360bbb-3(b)(1), unless the authorization is  terminated or revoked. Performed at [facility], [place] [address], [place], [place] [address]   Respiratory Panel by RT PCR (Flu A&B, Covid) - Nasopharyngeal Swab     Status: None   Collection Time: 08/13/20 12:12 AM   Specimen: Nasopharyngeal Swab  Result Value Ref Range Status   SARS Coronavirus 2 by RT PCR NEGATIVE NEGATIVE Final    Comment: (NOTE) SARS-CoV-2 target nucleic acids are NOT DETECTED.  The SARS-CoV-2 RNA is generally detectable in upper respiratoy specimens during the acute phase of infection. The lowest concentration of SARS-CoV-2 viral copies this assay can detect is 131 copies/mL. A negative result does not preclude SARS-Cov-2 infection and should not be used as the sole basis for treatment or other patient management decisions. A negative result may occur with  improper specimen collection/handling, submission of specimen other than nasopharyngeal swab, presence of viral mutation(s) within the areas targeted by this assay, and inadequate number of viral copies (<131 copies/mL). A negative result must be combined with clinical observations, patient history, and  epidemiological information. The expected result is Negative.  Fact Sheet for Patients:  PinkCheek.be  Fact Sheet for Healthcare Providers:  GravelBags.it  This test is no t yet approved or cleared by the [place] FDA and  has been authorized for detection and/or diagnosis of SARS-CoV-2 by FDA under an Emergency Use Authorization (EUA). This EUA will remain  in effect (meaning this test can be used) for the duration of the COVID-19 declaration under Section 564(b)(1) of the Act, 21 U.S.C. section 360bbb-3(b)(1), unless the authorization is terminated or revoked sooner.     Influenza A by PCR NEGATIVE NEGATIVE Final   Influenza B by PCR NEGATIVE NEGATIVE Final    Comment: (NOTE) The Xpert Xpress SARS-CoV-2/FLU/RSV assay is intended as an aid in  the diagnosis of influenza from Nasopharyngeal swab specimens and  should not be used as a sole basis for treatment. Nasal washings and  aspirates are unacceptable for Xpert Xpress SARS-CoV-2/FLU/RSV  testing.  Fact Sheet for Patients: PinkCheek.be  Fact Sheet for Healthcare Providers: GravelBags.it  This test is not yet approved or cleared by the [place] FDA and  has been authorized for detection and/or diagnosis of SARS-CoV-2 by  FDA under an Emergency Use Authorization (EUA). This EUA will remain  in effect (meaning this test can be used) for the duration of the  Covid-19 declaration  under Section 564(b)(1) of the Act, 21  U.S.C. section 360bbb-3(b)(1), unless the authorization is  terminated or revoked. Performed at [facility], [place] [address] [place], [place] [address]   Culture, blood (Routine X 2) w Reflex to ID Panel     Status: None (Preliminary result)   Collection Time: 08/13/20  8:57 AM   Specimen: BLOOD LEFT HAND  Result Value Ref Range Status   Specimen Description   Final     BLOOD LEFT HAND Performed at [facility] [address], [place], [place] [address]    Special Requests   Final    BOTTLES DRAWN AEROBIC AND ANAEROBIC Blood Culture adequate volume Performed at [facility] [address], [place], [place] [address]    Culture   Final    NO GROWTH < 24 HOURS Performed at [facility] [address], [place], [place] [address]    Report Status PENDING  Incomplete  Culture, blood (Routine X 2) w Reflex to ID Panel     Status: None (Preliminary result)   Collection Time: 08/13/20  9:02 AM   Specimen: BLOOD  Result Value Ref Range Status   Specimen Description   Final    BLOOD RIGHT ANTECUBITAL Performed at [facility] [address], [place], [place] [address]    Special Requests   Final    BOTTLES DRAWN AEROBIC AND ANAEROBIC Blood Culture adequate volume Performed at [facility] [address] [place], [place] [address]    Culture   Final    NO GROWTH < 24 HOURS Performed at [facility] [address] [place], [place] [address]    Report Status PENDING  Incomplete     Labs: Basic Metabolic Panel: Recent Labs  Lab 08/12/20 2133 08/13/20 0519 08/14/20 0546  NA 138 139 139  K 4.2 4.6 4.7  CL 106 104 108  CO2 23 24 22   GLUCOSE 141* 176* 88  BUN 27* 25* 22  CREATININE 1.08* 1.02* 0.96  CALCIUM 8.9 8.8* 8.2*  MG  --   --  1.9   Liver Function Tests: Recent Labs  Lab 08/12/20 2133 08/13/20 0519  AST 38 38  ALT 22 22  ALKPHOS 87 85  BILITOT 0.6 0.7  PROT 7.8 7.5  ALBUMIN 3.6 3.5   Recent Labs  Lab 08/12/20 2133  LIPASE 83*   No results for input(s): AMMONIA in the last 168 hours. CBC: Recent Labs  Lab 08/12/20 2133 08/13/20 0519 08/14/20 0546  WBC 13.9* 14.6* 8.9  NEUTROABS 10.7* 13.0* 6.1  HGB 9.1* 8.8* 7.7*  HCT 30.6* 29.4* 26.2*  MCV 82.0 81.2 82.9  PLT 338 309 258   Cardiac Enzymes: No results for input(s):  CKTOTAL, CKMB, CKMBINDEX, TROPONINI in the last 168 hours. BNP: BNP (last 3 results) Recent Labs    09/02/19 1727 08/04/20 1458  BNP 1,352.1* 530.4*    ProBNP (last 3 results) No results for input(s): PROBNP in the last 8760 hours.  CBG: Recent Labs  Lab 08/12/20 2325 08/13/20 1159 08/13/20 2032 08/14/20 0601 08/14/20 1205  GLUCAP 123* 116* 94 75 126*       Signed:  Irine Seal MD.  [facility] 08/14/2020, 2:23 PM

## 2020-08-14 NOTE — Evaluation (Signed)
Occupational Therapy Evaluation Patient Details Name: [name] MRN: [id number] DOB: 1926-11-01 Today's Date: 08/14/2020    History of Present Illness Pt is 86 yo female with PMH including CHF, afib not on anticoagulation, HTN, aortic stenosis, hypothyroidism, and anemia.  She was recently admitted for UTI.  Now presents with epigastric pain. CT abdomen and GI consult obtained - pt declining further intervention/testing and GI has signed off as majority of findings were chronic.  Pt reports to therapy today - she just ate too much too quick.   Clinical Impression   PTA, pt was living with her son and was independent with ADLs and light IADLs; son assists as pt needs. Currently, pt requires Supervision-Min Guard A for ADLs and functional mobility. Pt presenting with decreased balance, strength, and activity tolerance; however, do not feel she is far from her baseline. HR elevating to 146 with activity. Answered all pt questions. Recommend dc home once medically stable per physician. All acute OT needs met and will sign off. Thank you.    Follow Up Recommendations  Home health OT;Supervision/Assistance - 24 hour    Equipment Recommendations  None recommended by OT    Recommendations for Other Services       Precautions / Restrictions Precautions Precautions: Fall Precaution Comments: pt denies falls in past 1 year; watch HR      Mobility Bed Mobility Overal bed mobility: Modified Independent Bed Mobility: Supine to Sit     Supine to sit: Modified independent (Device/Increase time);HOB elevated     General bed mobility comments: In recliner upon arrival  Transfers Overall transfer level: Needs assistance Equipment used: None Transfers: Sit to/from Stand Sit to Stand: Min guard         General transfer comment: Min Guard A for safety    Balance Overall balance assessment: Needs assistance Sitting-balance support: No upper extremity supported;Feet  supported Sitting balance-Leahy Scale: Good     Standing balance support: No upper extremity supported Standing balance-Leahy Scale: Fair                             ADL either performed or assessed with clinical judgement   ADL Overall ADL's : Needs assistance/impaired Eating/Feeding: Set up;Sitting   Grooming: Set up;Supervision/safety;Standing   Upper Body Bathing: Supervision/ safety;Set up;Sitting   Lower Body Bathing: Min guard;Sit to/from stand   Upper Body Dressing : Supervision/safety;Set up;Sitting   Lower Body Dressing: Min guard;Sit to/from stand Lower Body Dressing Details (indicate cue type and reason): doffing soiled underwear; donned new ones Toilet Transfer: Min guard;Ambulation;Regular Toilet;Grab bars   Toileting- Clothing Manipulation and Hygiene: Min guard;Sitting/lateral lean;Sit to/from stand       Functional mobility during ADLs: Min guard General ADL Comments: Pt presenitng with decreased balance and strength. Very motivated. Performing at [facility]/History: Wears glasses Wears Glasses: Reading only Patient Visual Report: No change from baseline       Perception     Praxis      Pertinent Vitals/Pain Pain Assessment: No/denies pain     Hand Dominance Right   Extremity/Trunk Assessment Upper Extremity Assessment Upper Extremity Assessment: Overall WFL for tasks assessed (Weaker grasp but this is baseline)   Lower Extremity Assessment Lower Extremity Assessment: Defer to PT evaluation   Cervical / Trunk Assessment Cervical / Trunk Assessment: Kyphotic   Communication Communication Communication: No difficulties   Cognition Arousal/Alertness: Awake/alert Behavior During Therapy:  WFL for tasks assessed/performed Overall Cognitive Status: Within Functional Limits for tasks assessed                                     General Comments  HR elevating to 146  with activity. O2 monitor not picking up SpO2    Exercises     Shoulder Instructions      Home Living Family/patient expects to be discharged to:: Private residence Living Arrangements: Children Available Help at Discharge: Family;Available 24 hours/day Type of Home: House (Condo) Home Access: Level entry     Home Layout: One level     Bathroom Shower/Tub: Door;Walk-in Psychologist, prison and probation services: Handicapped height Bathroom Accessibility: Yes   Home Equipment: Environmental consultant - 2 wheels;Shower seat - built in;Grab bars - tub/shower          Prior Functioning/Environment Level of Independence: Needs assistance  Gait / Transfers Assistance Needed: Reports she uses RW to walk out on patio for exercise but just holds furniture if needed in house ADL's / [place] Needed: Pt was independent with dressing and toielting, had min A with baths.  Son does most of IADLs but pt reports she can make a meal if needed. Communication / Swallowing Assistance Needed: no difficulties Comments: Pt was getting HHPT        OT Problem List: Decreased strength;Decreased range of motion;Decreased activity tolerance;Impaired balance (sitting and/or standing);Decreased safety awareness;Decreased knowledge of use of DME or AE;Decreased knowledge of precautions      OT Treatment/Interventions:      OT Goals(Current goals can be found in the care plan section) Acute Rehab OT Goals Patient Stated Goal: Return home with 24 hr care from son OT Goal Formulation: With patient Time For Goal Achievement: 08/28/20 Potential to Achieve Goals: Good  OT Frequency:     Barriers to D/C:            Co-evaluation              AM-PAC OT "6 Clicks" Daily Activity     Outcome Measure Help from another person eating meals?: A Little Help from another person taking care of personal grooming?: A Little Help from another person toileting, which includes using toliet, bedpan, or urinal?: A Little Help  from another person bathing (including washing, rinsing, drying)?: A Little Help from another person to put on and taking off regular upper body clothing?: A Little Help from another person to put on and taking off regular lower body clothing?: A Little 6 Click Score: 18   End of Session Nurse Communication: Mobility status  Activity Tolerance: Patient tolerated treatment well Patient left: in chair;with call bell/phone within reach;with chair alarm set  OT Visit Diagnosis: Unsteadiness on feet (R26.81);Other abnormalities of gait and mobility (R26.89);Muscle weakness (generalized) (M62.81)                Time: 3361-2244 OT Time Calculation (min): 20 min Charges:  OT General Charges $OT Visit: 1 Visit OT Evaluation $OT Eval Low Complexity: [place], OTR/L Acute Rehab Pager: [phone] Office: [place] 08/14/2020, 3:10 PM

## 2020-08-16 LAB — GLUCOSE, CAPILLARY: Glucose-Capillary: 94 mg/dL (ref 70–99)

## 2020-08-17 ENCOUNTER — Telehealth: Payer: Self-pay

## 2020-08-17 NOTE — Telephone Encounter (Signed)
Transition Care Management Follow-up Telephone Call  Date of discharge and from where: 08/14/20-[facility]  How have you been since you were released from the hospital? ok  Any questions or concerns? No  Items Reviewed:  Did the pt receive and understand the discharge instructions provided? Yes   Medications obtained and verified? Yes   Other? No  n/a  Any new allergies since your discharge? No   Dietary orders reviewed? Yes  Do you have support at home? Yes   Home Care and Equipment/Supplies: Were home health services ordered? yes If so, what is the name of the agency? [facility]  Has the agency set up a time to come to the patient's home? no Were any new equipment or medical supplies ordered?  No What is the name of the medical supply agency? n/a Were you able to get the supplies/equipment? not applicable Do you have any questions related to the use of the equipment or supplies? No  Functional Questionnaire: (I = Independent and D = Dependent) ADLs: I  Bathing/Dressing- I  Meal Prep- I  Eating- I  Maintaining continence- I  Transferring/Ambulation- I  Managing Meds- I  Follow up appointments reviewed:   PCP Hospital f/u appt confirmed? Yes  Scheduled to see Wilfred Lacy on 08/25/20 @ 2:00pm.  [facility] f/u appt confirmed? No  Patient to schedule  Are transportation arrangements needed? No   If their condition worsens, is the pt aware to call PCP or go to the Emergency Dept.? Yes  Was the patient provided with contact information for the PCP's office or ED? Yes  Was to pt encouraged to call back with questions or concerns? Yes

## 2020-08-18 ENCOUNTER — Inpatient Hospital Stay: Payer: Medicare Other | Admitting: Nurse Practitioner

## 2020-08-18 LAB — CULTURE, BLOOD (ROUTINE X 2)
Culture: NO GROWTH
Culture: NO GROWTH
Special Requests: ADEQUATE
Special Requests: ADEQUATE

## 2020-08-19 DIAGNOSIS — I700 Atherosclerosis of aorta: Secondary | ICD-10-CM | POA: Diagnosis not present

## 2020-08-19 DIAGNOSIS — N321 Vesicointestinal fistula: Secondary | ICD-10-CM | POA: Diagnosis not present

## 2020-08-19 DIAGNOSIS — I480 Paroxysmal atrial fibrillation: Secondary | ICD-10-CM | POA: Diagnosis not present

## 2020-08-19 DIAGNOSIS — Z87440 Personal history of urinary (tract) infections: Secondary | ICD-10-CM | POA: Diagnosis not present

## 2020-08-19 DIAGNOSIS — K5792 Diverticulitis of intestine, part unspecified, without perforation or abscess without bleeding: Secondary | ICD-10-CM | POA: Diagnosis not present

## 2020-08-19 DIAGNOSIS — E611 Iron deficiency: Secondary | ICD-10-CM | POA: Diagnosis not present

## 2020-08-19 DIAGNOSIS — B961 Klebsiella pneumoniae [K. pneumoniae] as the cause of diseases classified elsewhere: Secondary | ICD-10-CM | POA: Diagnosis not present

## 2020-08-19 DIAGNOSIS — I350 Nonrheumatic aortic (valve) stenosis: Secondary | ICD-10-CM | POA: Diagnosis not present

## 2020-08-19 DIAGNOSIS — Z9181 History of falling: Secondary | ICD-10-CM | POA: Diagnosis not present

## 2020-08-19 DIAGNOSIS — I10 Essential (primary) hypertension: Secondary | ICD-10-CM | POA: Diagnosis not present

## 2020-08-19 DIAGNOSIS — E785 Hyperlipidemia, unspecified: Secondary | ICD-10-CM | POA: Diagnosis not present

## 2020-08-19 DIAGNOSIS — M150 Primary generalized (osteo)arthritis: Secondary | ICD-10-CM | POA: Diagnosis not present

## 2020-08-19 DIAGNOSIS — M069 Rheumatoid arthritis, unspecified: Secondary | ICD-10-CM | POA: Diagnosis not present

## 2020-08-19 DIAGNOSIS — K449 Diaphragmatic hernia without obstruction or gangrene: Secondary | ICD-10-CM | POA: Diagnosis not present

## 2020-08-19 DIAGNOSIS — J449 Chronic obstructive pulmonary disease, unspecified: Secondary | ICD-10-CM | POA: Diagnosis not present

## 2020-08-19 DIAGNOSIS — I5042 Chronic combined systolic (congestive) and diastolic (congestive) heart failure: Secondary | ICD-10-CM | POA: Diagnosis not present

## 2020-08-19 DIAGNOSIS — K589 Irritable bowel syndrome without diarrhea: Secondary | ICD-10-CM | POA: Diagnosis not present

## 2020-08-19 DIAGNOSIS — E039 Hypothyroidism, unspecified: Secondary | ICD-10-CM | POA: Diagnosis not present

## 2020-08-19 DIAGNOSIS — I110 Hypertensive heart disease with heart failure: Secondary | ICD-10-CM | POA: Diagnosis not present

## 2020-08-19 DIAGNOSIS — Z7982 Long term (current) use of aspirin: Secondary | ICD-10-CM | POA: Diagnosis not present

## 2020-08-19 DIAGNOSIS — I4819 Other persistent atrial fibrillation: Secondary | ICD-10-CM | POA: Diagnosis not present

## 2020-08-19 DIAGNOSIS — N136 Pyonephrosis: Secondary | ICD-10-CM | POA: Diagnosis not present

## 2020-08-19 DIAGNOSIS — Z9981 Dependence on supplemental oxygen: Secondary | ICD-10-CM | POA: Diagnosis not present

## 2020-08-19 DIAGNOSIS — R1900 Intra-abdominal and pelvic swelling, mass and lump, unspecified site: Secondary | ICD-10-CM | POA: Diagnosis not present

## 2020-08-25 ENCOUNTER — Inpatient Hospital Stay: Payer: Medicare Other | Admitting: Nurse Practitioner

## 2020-08-27 ENCOUNTER — Inpatient Hospital Stay: Payer: Medicare Other | Admitting: Nurse Practitioner

## 2020-09-01 DIAGNOSIS — N3020 Other chronic cystitis without hematuria: Secondary | ICD-10-CM | POA: Diagnosis not present

## 2020-09-01 DIAGNOSIS — N321 Vesicointestinal fistula: Secondary | ICD-10-CM | POA: Diagnosis not present

## 2020-09-01 DIAGNOSIS — N1330 Unspecified hydronephrosis: Secondary | ICD-10-CM | POA: Diagnosis not present

## 2020-09-02 ENCOUNTER — Other Ambulatory Visit: Payer: Self-pay

## 2020-09-03 ENCOUNTER — Ambulatory Visit (INDEPENDENT_AMBULATORY_CARE_PROVIDER_SITE_OTHER): Payer: Medicare Other | Admitting: Nurse Practitioner

## 2020-09-03 ENCOUNTER — Encounter: Payer: Self-pay | Admitting: Nurse Practitioner

## 2020-09-03 VITALS — BP 128/60 | Temp 96.1°F | Ht 60.0 in | Wt 109.6 lb

## 2020-09-03 DIAGNOSIS — D500 Iron deficiency anemia secondary to blood loss (chronic): Secondary | ICD-10-CM | POA: Diagnosis not present

## 2020-09-03 DIAGNOSIS — M0579 Rheumatoid arthritis with rheumatoid factor of multiple sites without organ or systems involvement: Secondary | ICD-10-CM | POA: Diagnosis not present

## 2020-09-03 DIAGNOSIS — I10 Essential (primary) hypertension: Secondary | ICD-10-CM | POA: Diagnosis not present

## 2020-09-03 DIAGNOSIS — E039 Hypothyroidism, unspecified: Secondary | ICD-10-CM | POA: Diagnosis not present

## 2020-09-03 DIAGNOSIS — F411 Generalized anxiety disorder: Secondary | ICD-10-CM

## 2020-09-03 DIAGNOSIS — M6281 Muscle weakness (generalized): Secondary | ICD-10-CM | POA: Diagnosis not present

## 2020-09-03 DIAGNOSIS — N1831 Chronic kidney disease, stage 3a: Secondary | ICD-10-CM

## 2020-09-03 DIAGNOSIS — D631 Anemia in chronic kidney disease: Secondary | ICD-10-CM | POA: Diagnosis not present

## 2020-09-03 DIAGNOSIS — N321 Vesicointestinal fistula: Secondary | ICD-10-CM | POA: Diagnosis not present

## 2020-09-03 DIAGNOSIS — D649 Anemia, unspecified: Secondary | ICD-10-CM

## 2020-09-03 MED ORDER — ALPRAZOLAM 0.25 MG PO TABS: 0.2500 mg | ORAL_TABLET | Freq: Every evening | ORAL | 2 refills | Status: DC | PRN

## 2020-09-03 MED ORDER — METOPROLOL SUCCINATE ER 25 MG PO TB24: 75.0000 mg | ORAL_TABLET | Freq: Every day | ORAL | 11 refills | Status: DC

## 2020-09-03 NOTE — Patient Instructions (Addendum)
Need to schedule appt with Gastroenterology [phone])  and urology ([phone])  Low tsh, decrease levothyroxine to 7mcg, new rx sent. Stable cbc with persistent anemia and elevated WBC Anemia due to chronic GI bleed which has led to low iron. Start oral ferrous sulfate 1tab BID or will you consider going to hematology to discuss iron transfusion? Return to lab for urine collection.

## 2020-09-03 NOTE — Assessment & Plan Note (Deleted)
Stable mood with use of alprazolam daily. Declined use of any other medication

## 2020-09-03 NOTE — Assessment & Plan Note (Signed)
Chronic LE edema No cellulitis Declines to use compression stocking, stating it causes more pain

## 2020-09-03 NOTE — Assessment & Plan Note (Signed)
Stable mood with use of alprazolam daily. Declined use of any other medication

## 2020-09-03 NOTE — Assessment & Plan Note (Addendum)
Stable BP with metoprolol BP Readings from Last 3 Encounters:  09/03/20 128/60  08/14/20 118/81  08/07/20 (!) 109/56   Continue medication

## 2020-09-03 NOTE — Progress Notes (Signed)
Subjective:  Patient ID: [name], female    DOB: 11-03-1926  Age: 86 y.o. MRN: [id number]  CC: Hospitalization Follow-up (Pt has been doing well since leaving the hospital per son. )  HPI Accompanied by son.  Malnutrition of moderate degree continuous weight loss Advised about need for small frequent meals and supplement meals with ensure of boost. Wt Readings from Last 3 Encounters:  09/03/20 109 lb 9.6 oz (49.7 kg)  08/13/20 112 lb 3.4 oz (50.9 kg)  08/07/20 112 lb 3.4 oz (50.9 kg)    CMP     Component Value Date/Time   NA 139 08/14/2020 0546   NA 141 11/04/2018 0000   K 4.7 08/14/2020 0546   CL 108 08/14/2020 0546   CO2 22 08/14/2020 0546   GLUCOSE 88 08/14/2020 0546   BUN 22 08/14/2020 0546   BUN 9 11/04/2018 0000   CREATININE 0.96 08/14/2020 0546   CREATININE 1.38 (H) 09/12/2019 1541   CALCIUM 8.2 (L) 08/14/2020 0546   CALCIUM 8.6 10/09/2018 0000   PROT 7.5 08/13/2020 0519   ALBUMIN 3.5 08/13/2020 0519   AST 38 08/13/2020 0519   ALT 22 08/13/2020 0519   ALKPHOS 85 08/13/2020 0519   BILITOT 0.7 08/13/2020 0519   GFRNONAA 52 (L) 08/14/2020 0546   GFRAA 36 (L) 06/21/2020 0533     Varicose veins of lower extremities with ulcer, unspecified laterality (HCC) Chronic LE edema No cellulitis Declines to use compression stocking, stating it causes more pain  Anxiety Stable mood with use of alprazolam daily. Declined use of any other medication  Benign essential hypertension Stable BP with metoprolol BP Readings from Last 3 Encounters:  09/03/20 128/60  08/14/20 118/81  08/07/20 (!) 109/56   Continue medication  Anemia Stable cbc with persistent anemia and elevated WBC Anemia due to chronic GI bleed which has led to low iron. Start oral ferrous sulfate 1tab BID or will you consider going to hematology to discuss iron transfusion? Advised to schedule appt with Dr. Hilarie Fredrickson (GI) as instructed during recent hospitalization  Hypothyroid Low tsh,  decrease levothyroxine to 33mcg, new rx sent. F/up in 71months  Wt Readings from Last 3 Encounters:  09/03/20 109 lb 9.6 oz (49.7 kg)  08/13/20 112 lb 3.4 oz (50.9 kg)  08/07/20 112 lb 3.4 oz (50.9 kg)   Reviewed past Medical, Social and Family history today.  Outpatient Medications Prior to Visit  Medication Sig Dispense Refill  . acetaminophen (TYLENOL) 500 MG tablet Take 1,000 mg by mouth every 8 (eight) hours as needed for moderate pain (joint pain).     Marland Kitchen aspirin 81 MG chewable tablet Chew 1 tablet (81 mg total) by mouth daily. 30 tablet 0  . fosfomycin (MONUROL) 3 g PACK Take 3 g by mouth once. Repeat in 10 days if needed.    . gabapentin (NEURONTIN) 100 MG capsule Take 100 mg by mouth daily.     Marland Kitchen HYDROcodone-acetaminophen (NORCO/VICODIN) 5-325 MG tablet Take 1 tablet by mouth every 6 (six) hours as needed for moderate pain.     Marland Kitchen Olopatadine HCl 0.2 % SOLN Place 1 drop into both eyes daily. (Patient taking differently: Place 1 drop into both eyes in the morning, at noon, in the evening, and at bedtime. ) 2.5 mL 0  . pantoprazole (PROTONIX) 40 MG tablet Take 1 tablet (40 mg total) by mouth daily at 6 (six) AM. 30 tablet 2  . potassium chloride SA (KLOR-CON) 20 MEQ tablet Take 1 tablet (20 mEq  total) by mouth daily as needed. (Patient taking differently: Take 20 mEq by mouth daily. ) 30 tablet 5  . Pyridoxine HCl (VITAMIN B-6 PO) Take 1 tablet by mouth daily.    Marland Kitchen ALPRAZolam (XANAX) 0.25 MG tablet Take 1 tablet (0.25 mg total) by mouth at bedtime as needed for anxiety. FOR ANXIETY/RESTLESS LEGS (Patient taking differently: Take 0.25 mg by mouth at bedtime as needed for anxiety. ) 30 tablet 2  . levothyroxine (SYNTHROID) 75 MCG tablet TAKE 1 TABLET BY MOUTH ONCE DAILY BEFORE BREAKFAST (Patient taking differently: Take 75 mcg by mouth daily before breakfast. ) 90 tablet 1  . metoprolol succinate (TOPROL-XL) 25 MG 24 hr tablet Take 75 mg by mouth daily.    . furosemide (LASIX) 20 MG tablet  Take 1 tablet (20 mg total) by mouth daily as needed. (Patient not taking: Reported on 08/12/2020) 30 tablet 5   No facility-administered medications prior to visit.    ROS See HPI  Objective:  BP 128/60 (BP Location: Right Arm, Patient Position: Sitting, Cuff Size: Normal)   Temp (!) 96.1 F (35.6 C) (Temporal)   Ht 5' (1.524 m)   Wt 109 lb 9.6 oz (49.7 kg)   SpO2 97%   BMI 21.40 kg/m   Physical Exam Vitals reviewed.  Constitutional:      General: She is not in acute distress. Cardiovascular:     Rate and Rhythm: Normal rate. Rhythm irregular.     Heart sounds: Murmur heard.   Pulmonary:     Effort: Pulmonary effort is normal.     Breath sounds: Normal breath sounds.  Abdominal:     General: Bowel sounds are normal. There is no distension.     Palpations: Abdomen is soft.     Tenderness: There is no abdominal tenderness. There is no guarding.  Musculoskeletal:     Right lower leg: Edema present.     Left lower leg: Edema present.  Skin:    Findings: No erythema.  Neurological:     Mental Status: She is alert and oriented to person, place, and time.  Psychiatric:        Mood and Affect: Mood normal.        Behavior: Behavior normal.        Thought Content: Thought content normal.    Assessment & Plan:  This visit occurred during the SARS-CoV-2 public health emergency.  Safety protocols were in place, including screening questions prior to the visit, additional usage of staff PPE, and extensive cleaning of exam room while observing appropriate contact time as indicated for disinfecting solutions.   Kenedie was seen today for hospitalization follow-up.  Diagnoses and all orders for this visit:  Benign essential hypertension -     Cancel: Basic metabolic panel -     Basic metabolic panel -     metoprolol succinate (TOPROL-XL) 25 MG 24 hr tablet; Take 3 tablets (75 mg total) by mouth daily.  Hypothyroidism, unspecified type -     Cancel: TSH -     Cancel: T4,  free -     TSH -     T4, free -     levothyroxine (SYNTHROID) 50 MCG tablet; Take 1 tablet (50 mcg total) by mouth daily before breakfast.  Stage 3a chronic kidney disease (HCC) -     Cancel: Basic metabolic panel -     Basic metabolic panel  Colovesical fistula -     Cancel: Urinalysis w microscopic + reflex cultur -  Urinalysis w microscopic + reflex cultur; Future  Generalized muscle weakness -     Ambulatory referral to Home Health  Rheumatoid arthritis involving multiple sites with positive rheumatoid factor ([place]) -     Ambulatory referral to [facility]  GAD (generalized anxiety disorder) -     ALPRAZolam (XANAX) 0.25 MG tablet; Take 1 tablet (0.25 mg total) by mouth at bedtime as needed for anxiety. FOR ANXIETY/RESTLESS LEGS  Iron deficiency anemia due to chronic blood loss -     CBC w/Diff -     Iron, TIBC and Ferritin Panel -     ferrous gluconate (FERGON) 324 MG tablet; Take 1 tablet (324 mg total) by mouth 2 (two) times daily with a meal.  Other orders -     Cancel: CBC w/Diff -     Cancel: Iron, TIBC and Ferritin Panel -     Urinalysis w microscopic + reflex cultur    Problem List Items Addressed This Visit      Cardiovascular and Mediastinum   Benign essential hypertension - Primary    Stable BP with metoprolol BP Readings from Last 3 Encounters:  09/03/20 128/60  08/14/20 118/81  08/07/20 (!) 109/56   Continue medication      Relevant Medications   metoprolol succinate (TOPROL-XL) 25 MG 24 hr tablet   Other Relevant Orders   Basic metabolic panel (Completed)     Digestive   Colovesical fistula   Relevant Orders   Urinalysis w microscopic + reflex cultur     Endocrine   Hypothyroid    Low tsh, decrease levothyroxine to 43mcg, new rx sent. F/up in 19months      Relevant Medications   metoprolol succinate (TOPROL-XL) 25 MG 24 hr tablet   levothyroxine (SYNTHROID) 50 MCG tablet   Other Relevant Orders   TSH (Completed)   T4, free  (Completed)     Musculoskeletal and Integument   Rheumatoid arthritis ([place])   Relevant Orders   Ambulatory referral to [facility]     Other   Anemia    Stable cbc with persistent anemia and elevated WBC Anemia due to chronic GI bleed which has led to low iron. Start oral ferrous sulfate 1tab BID or will you consider going to hematology to discuss iron transfusion? Advised to schedule appt with Dr. Hilarie Fredrickson (GI) as instructed during recent hospitalization      Relevant Medications   ferrous gluconate (FERGON) 324 MG tablet   Other Relevant Orders   CBC w/Diff (Completed)   Iron, TIBC and Ferritin Panel (Completed)   Anxiety    Stable mood with use of alprazolam daily. Declined use of any other medication      Relevant Medications   ALPRAZolam (XANAX) 0.25 MG tablet (Start on 09/22/2020)    Other Visit Diagnoses    Stage 3a chronic kidney disease ([place])       Relevant Orders   Basic metabolic panel (Completed)   Generalized muscle weakness       Relevant Orders   Ambulatory referral to Home Health      Follow-up: Return in about 6 months (around 03/03/2021) for HTN and hypothyroidism (29mins).  Wilfred Lacy, NP

## 2020-09-03 NOTE — Assessment & Plan Note (Addendum)
continuous weight loss Advised about need for small frequent meals and supplement meals with ensure of boost. Wt Readings from Last 3 Encounters:  09/03/20 109 lb 9.6 oz (49.7 kg)  08/13/20 112 lb 3.4 oz (50.9 kg)  08/07/20 112 lb 3.4 oz (50.9 kg)    CMP     Component Value Date/Time   NA 139 08/14/2020 0546   NA 141 11/04/2018 0000   K 4.7 08/14/2020 0546   CL 108 08/14/2020 0546   CO2 22 08/14/2020 0546   GLUCOSE 88 08/14/2020 0546   BUN 22 08/14/2020 0546   BUN 9 11/04/2018 0000   CREATININE 0.96 08/14/2020 0546   CREATININE 1.38 (H) 09/12/2019 1541   CALCIUM 8.2 (L) 08/14/2020 0546   CALCIUM 8.6 10/09/2018 0000   PROT 7.5 08/13/2020 0519   ALBUMIN 3.5 08/13/2020 0519   AST 38 08/13/2020 0519   ALT 22 08/13/2020 0519   ALKPHOS 85 08/13/2020 0519   BILITOT 0.7 08/13/2020 0519   GFRNONAA 52 (L) 08/14/2020 0546   GFRAA 36 (L) 06/21/2020 0533

## 2020-09-03 NOTE — Assessment & Plan Note (Addendum)
Stable cbc with persistent anemia and elevated WBC Anemia due to chronic GI bleed which has led to low iron. Start oral ferrous sulfate 1tab BID or will you consider going to hematology to discuss iron transfusion? Advised to schedule appt with Dr. Hilarie Fredrickson (GI) as instructed during recent hospitalization

## 2020-09-06 LAB — CBC WITH DIFFERENTIAL/PLATELET
Absolute Monocytes: 780 cells/uL (ref 200–950)
Basophils Absolute: 72 cells/uL (ref 0–200)
Basophils Relative: 0.6 %
Eosinophils Absolute: 312 cells/uL (ref 15–500)
Eosinophils Relative: 2.6 %
HCT: 28.7 % — ABNORMAL LOW (ref 35.0–45.0)
Hemoglobin: 9.1 g/dL — ABNORMAL LOW (ref 11.7–15.5)
Lymphs Abs: 2016 cells/uL (ref 850–3900)
MCH: 24.5 pg — ABNORMAL LOW (ref 27.0–33.0)
MCHC: 31.7 g/dL — ABNORMAL LOW (ref 32.0–36.0)
MCV: 77.4 fL — ABNORMAL LOW (ref 80.0–100.0)
MPV: 9.1 fL (ref 7.5–12.5)
Monocytes Relative: 6.5 %
Neutro Abs: 8820 cells/uL — ABNORMAL HIGH (ref 1500–7800)
Neutrophils Relative %: 73.5 %
Platelets: 364 10*3/uL (ref 140–400)
RBC: 3.71 10*6/uL — ABNORMAL LOW (ref 3.80–5.10)
RDW: 15.9 % — ABNORMAL HIGH (ref 11.0–15.0)
Total Lymphocyte: 16.8 %
WBC: 12 10*3/uL — ABNORMAL HIGH (ref 3.8–10.8)

## 2020-09-06 LAB — T4, FREE: Free T4: 1.3 ng/dL (ref 0.8–1.8)

## 2020-09-06 LAB — TSH: TSH: 0.26 mIU/L — ABNORMAL LOW (ref 0.40–4.50)

## 2020-09-06 LAB — IRON,TIBC AND FERRITIN PANEL
%SAT: 6 % (calc) — ABNORMAL LOW (ref 16–45)
Ferritin: 11 ng/mL — ABNORMAL LOW (ref 16–288)
Iron: 24 ug/dL — ABNORMAL LOW (ref 45–160)
TIBC: 404 mcg/dL (calc) (ref 250–450)

## 2020-09-06 LAB — BASIC METABOLIC PANEL
BUN/Creatinine Ratio: 23 (calc) — ABNORMAL HIGH (ref 6–22)
BUN: 26 mg/dL — ABNORMAL HIGH (ref 7–25)
CO2: 24 mmol/L (ref 20–32)
Calcium: 9.5 mg/dL (ref 8.6–10.4)
Chloride: 103 mmol/L (ref 98–110)
Creat: 1.13 mg/dL — ABNORMAL HIGH (ref 0.60–0.88)
Glucose, Bld: 103 mg/dL — ABNORMAL HIGH (ref 65–99)
Potassium: 5.5 mmol/L — ABNORMAL HIGH (ref 3.5–5.3)
Sodium: 136 mmol/L (ref 135–146)

## 2020-09-06 LAB — URINALYSIS W MICROSCOPIC + REFLEX CULTURE

## 2020-09-06 IMAGING — CT CT ABD-PELV W/ CM
2 of 5 series · 15 of 46 positions shown, 17 images · IV contrast (APPLIED)
Comparison: 07/24/2018

CLINICAL DATA: Abdominal pain, fever, question abscess, recently
diagnosed with colitis versus diverticulitis of the sigmoid colon

EXAM:
CT ABDOMEN AND PELVIS WITH CONTRAST
TECHNIQUE: Multidetector CT imaging of the abdomen and pelvis was performed
using the standard protocol following bolus administration of
intravenous contrast. Sagittal and coronal MPR images reconstructed
from axial data set.
CONTRAST:  100mL OMNIPAQUE IOHEXOL 300 MG/ML SOLN IV. Dilute oral
contrast.

[Series 2: axial st · axial · 0.81mm/px · z∈[-463,-103]mm · 12 of 82 slices shown, 14 images]
[im 5/82  soft-tissue]
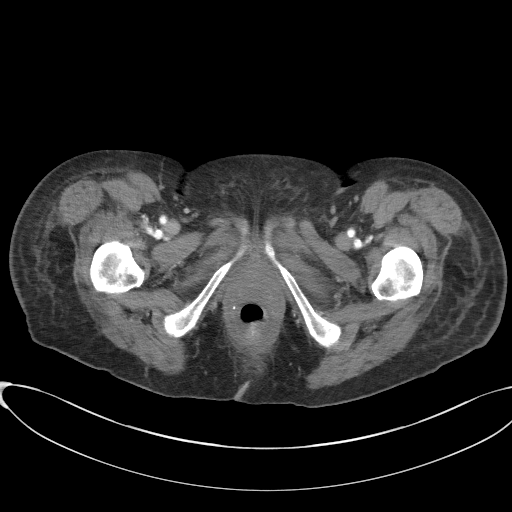
[im 5/82  bone]
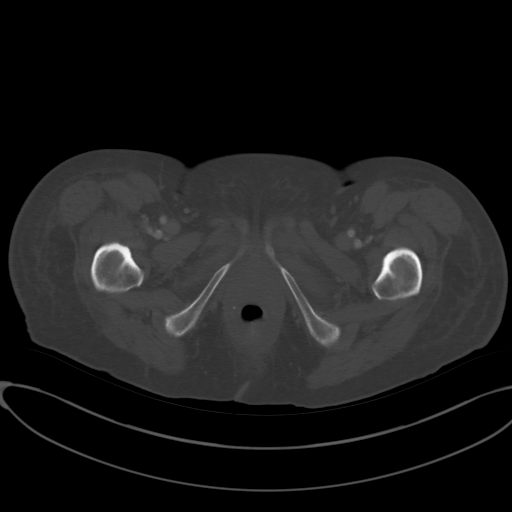
[im 15/82  soft-tissue]
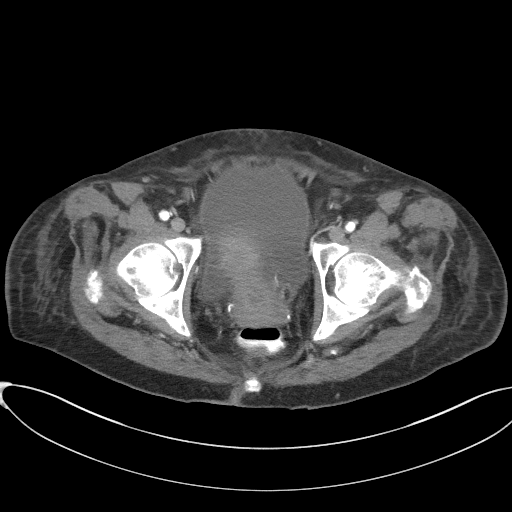
[im 20/82  soft-tissue]
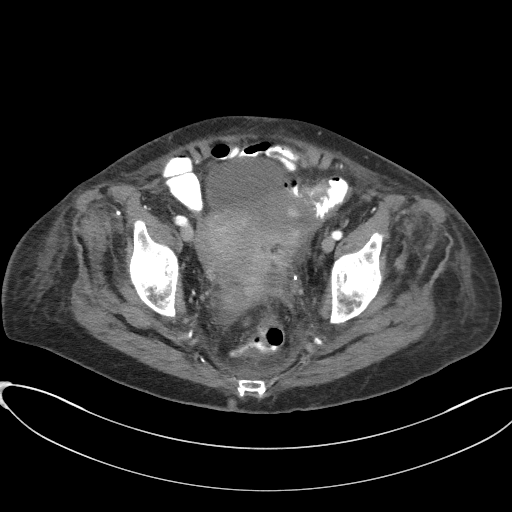
[im 24/82  soft-tissue]
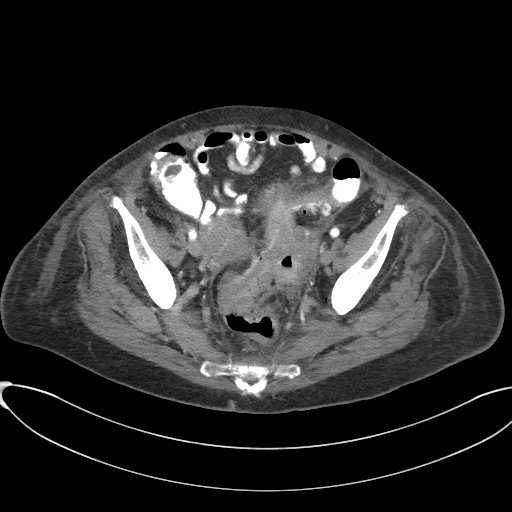
[im 34/82  soft-tissue]
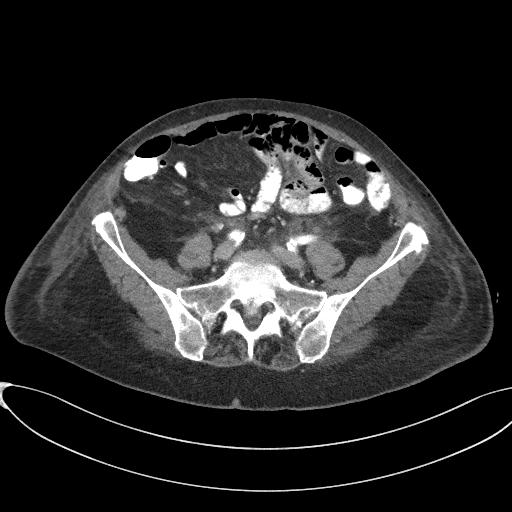
[im 39/82  soft-tissue]
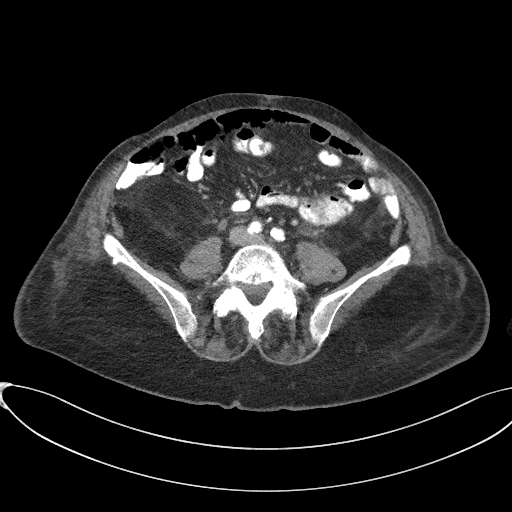
[im 43/82  soft-tissue]
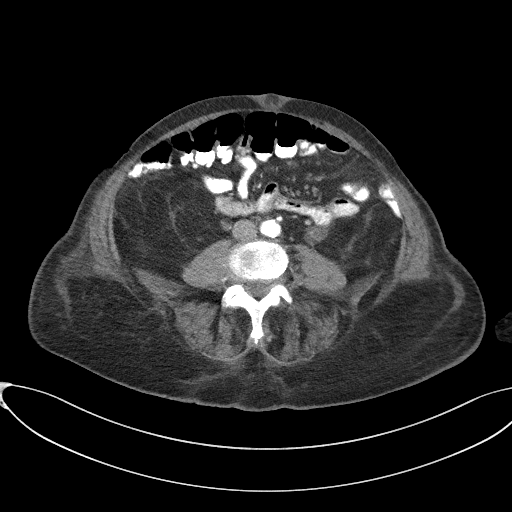
[im 53/82  soft-tissue]
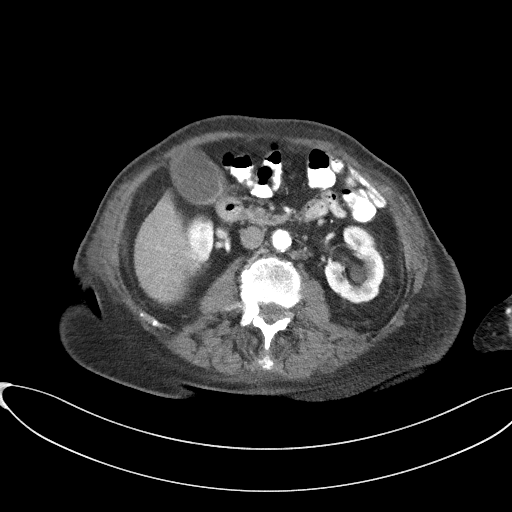
[im 58/82  soft-tissue]
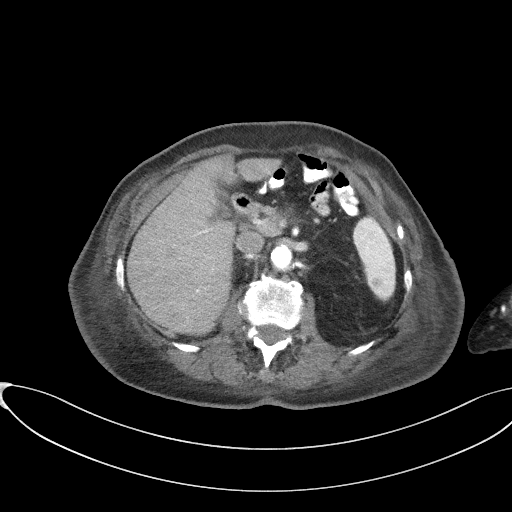
[im 58/82  bone]
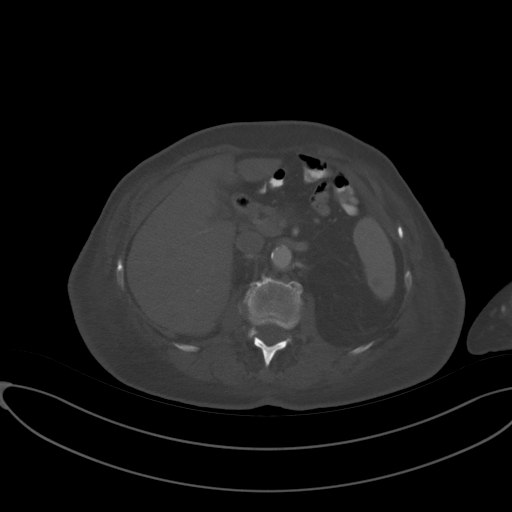
[im 62/82  soft-tissue]
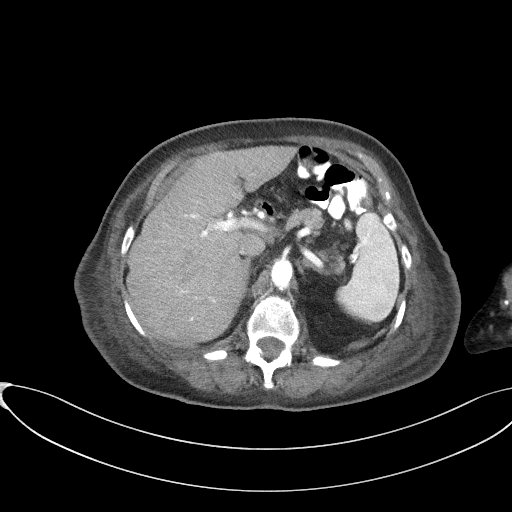
[im 72/82  soft-tissue]
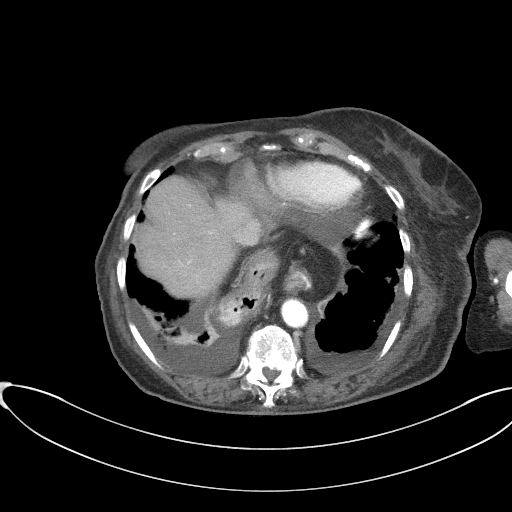
[im 77/82  soft-tissue]
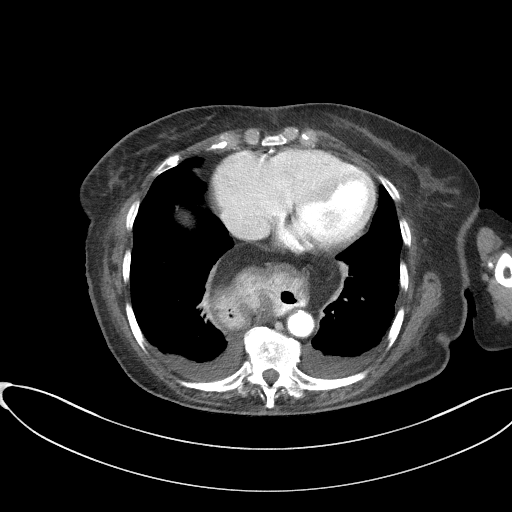

[Series 5: coronal st · coronal · 0.64mm/px · 3 of 85 slices shown]
[im 29/85  soft-tissue]
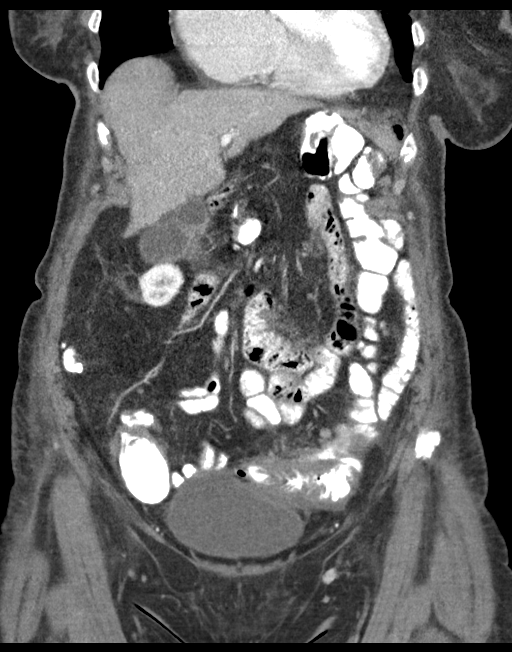
[im 38/85  soft-tissue]
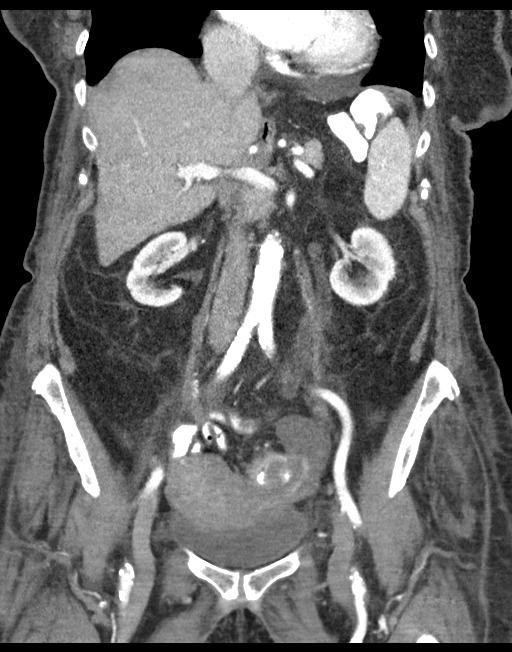
[im 47/85  soft-tissue]
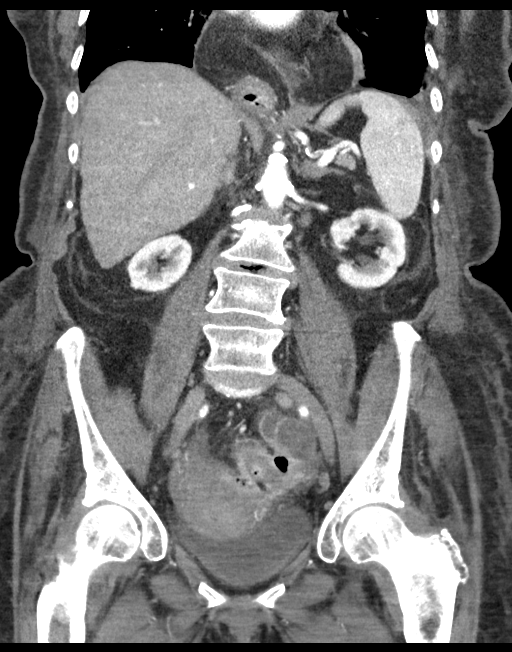

[15 of 46 positions shown; findings below may reference images not displayed]

FINDINGS: Lower chest: Enlargement of cardiac chambers. Small bibasilar
pleural effusions and minimal atelectasis.

Hepatobiliary: Mild fatty infiltration of liver. Distended
gallbladder without definite calcification

Pancreas: Unremarkable

Spleen: Small splenic cyst inferiorly. Additional tiny
low-attenuation focus question additional cyst centrally in spleen.

Adrenals/Urinary Tract: Adrenal glands unremarkable. Age-related
renal cortical atrophy. No definite renal mass or hydronephrosis.
Unremarkable ureters and bladder.

Stomach/Bowel: Scattered colonic diverticulosis. Chronic wall
thickening of the sigmoid colon with narrowing of lumen and
surrounding inflammatory changes question long segment of
diverticulitis versus colitis. Small extraluminal gas collection
between sigmoid colon and LEFT ovary. No additional defined abscess
collections. Edema of sigmoid mesocolon. Remaining bowel loops
unremarkable. Large hiatal hernia.

Vascular/Lymphatic: Atherosclerotic calcifications aorta common
iliac arteries and coronary arteries. Aorta normal caliber. No
adenopathy.

Reproductive: Low-attenuation lesion at the LEFT lateral aspect of
the lower uterus question leiomyoma versus small extra uterine
collection unchanged 2.0 x 2.0 cm. Otherwise unremarkable uterus and
RIGHT adnexa. Multiloculated cystic LEFT ovarian mass 5.5 x 3.7 cm
image 55.

Other: Increased presacral edema. Increased edema in sigmoid
mesocolon. No additional abscess collections. Umbilical hernia
containing fat. BILATERAL inguinal hernias containing fat. No free
air. Minimal free fluid.

Musculoskeletal: Osseous demineralization with scattered
degenerative changes of the lumbar spine.
IMPRESSION: 2.0 cm diameter leiomyoma versus small adjacent extra uterine
collection at the LEFT lateral aspect of uterus, tiny abscess not
excluded.

Persistent inflammatory changes of the sigmoid colon with wall
thickening, luminal narrowing, and extensive infiltration of the
sigmoid mesocolon either representing diverticulitis or colitis with
pericolic inflammation.

Small foci of extraluminal gas are identified without additional
drainable abscess collection.

Multiloculated cystic neoplasm of the LEFT ovary, could be malignant
or benign.

Umbilical and BILATERAL inguinal hernias containing fat.

Bibasilar pleural effusions and minimal atelectasis.

## 2020-09-07 ENCOUNTER — Encounter: Payer: Self-pay | Admitting: Nurse Practitioner

## 2020-09-07 MED ORDER — LEVOTHYROXINE SODIUM 50 MCG PO TABS: 50.0000 ug | ORAL_TABLET | Freq: Every day | ORAL | 1 refills | Status: DC

## 2020-09-07 MED ORDER — FERROUS GLUCONATE 324 (38 FE) MG PO TABS: 324.0000 mg | ORAL_TABLET | Freq: Two times a day (BID) | ORAL | 3 refills | Status: DC

## 2020-09-07 NOTE — Assessment & Plan Note (Addendum)
Low tsh, decrease levothyroxine to 15mcg, new rx sent. F/up in 6months

## 2020-09-08 DIAGNOSIS — N136 Pyonephrosis: Secondary | ICD-10-CM | POA: Diagnosis not present

## 2020-09-08 DIAGNOSIS — N321 Vesicointestinal fistula: Secondary | ICD-10-CM | POA: Diagnosis not present

## 2020-09-08 DIAGNOSIS — I5042 Chronic combined systolic (congestive) and diastolic (congestive) heart failure: Secondary | ICD-10-CM | POA: Diagnosis not present

## 2020-09-08 DIAGNOSIS — K5792 Diverticulitis of intestine, part unspecified, without perforation or abscess without bleeding: Secondary | ICD-10-CM | POA: Diagnosis not present

## 2020-09-08 DIAGNOSIS — B961 Klebsiella pneumoniae [K. pneumoniae] as the cause of diseases classified elsewhere: Secondary | ICD-10-CM | POA: Diagnosis not present

## 2020-09-08 DIAGNOSIS — I110 Hypertensive heart disease with heart failure: Secondary | ICD-10-CM | POA: Diagnosis not present

## 2020-09-17 ENCOUNTER — Telehealth: Payer: Self-pay | Admitting: Nurse Practitioner

## 2020-09-18 DIAGNOSIS — N136 Pyonephrosis: Secondary | ICD-10-CM | POA: Diagnosis not present

## 2020-09-29 ENCOUNTER — Other Ambulatory Visit: Payer: Self-pay | Admitting: Nurse Practitioner

## 2020-09-29 NOTE — Telephone Encounter (Signed)
Patients son is calling to get a refill on patients Gabapentin. If approved, please send to [facility] on [address] and call son at [phone]. Patient is completely out.

## 2020-09-30 MED ORDER — GABAPENTIN 100 MG PO CAPS: 100.0000 mg | ORAL_CAPSULE | Freq: Every day | ORAL | 1 refills | Status: DC

## 2020-10-08 ENCOUNTER — Other Ambulatory Visit: Payer: Self-pay | Admitting: Nurse Practitioner

## 2020-10-08 DIAGNOSIS — F411 Generalized anxiety disorder: Secondary | ICD-10-CM

## 2020-10-08 MED ORDER — ALPRAZOLAM 0.25 MG PO TABS: 0.2500 mg | ORAL_TABLET | Freq: Every evening | ORAL | 2 refills | Status: DC | PRN

## 2020-10-08 NOTE — Telephone Encounter (Signed)
Pt is switching pharmacies to [facility] at [place], [place], [place] [address], [address].  Pt said that the script for ALPRAZolam Duanne Moron) 0.25 MG tablet expires on christmas day and she was wondering if they were able to get a day early. Please advise

## 2020-10-08 NOTE — Telephone Encounter (Signed)
Rx sent 

## 2020-10-11 DIAGNOSIS — N3020 Other chronic cystitis without hematuria: Secondary | ICD-10-CM | POA: Diagnosis not present

## 2020-10-11 DIAGNOSIS — R102 Pelvic and perineal pain: Secondary | ICD-10-CM | POA: Diagnosis not present

## 2020-10-11 DIAGNOSIS — N321 Vesicointestinal fistula: Secondary | ICD-10-CM | POA: Diagnosis not present

## 2020-10-13 ENCOUNTER — Telehealth: Payer: Self-pay

## 2020-10-13 NOTE — Telephone Encounter (Signed)
(  4:46p) SW left a message requesting a call back.

## 2020-10-20 ENCOUNTER — Other Ambulatory Visit: Payer: Self-pay

## 2020-10-20 ENCOUNTER — Other Ambulatory Visit: Payer: Medicare Other

## 2020-10-20 DIAGNOSIS — Z515 Encounter for palliative care: Secondary | ICD-10-CM

## 2020-10-20 NOTE — Progress Notes (Signed)
COMMUNITY PALLIATIVE CARE SW NOTE  PATIENT NAME: [name] DOB: [birth year] MRN: [id number]  PRIMARY CARE PROVIDER: [name], NP  RESPONSIBLE PARTY:  Acct ID - Guarantor Home Phone Work Phone Relationship Acct Type  [id number] - Kaffenberger,[name] [phone]  Self P/F     [place], [place] [id number]     PLAN OF CARE and INTERVENTIONS:             1. GOALS OF CARE/ ADVANCE CARE PLANNING:  Goal is for patient to remain in her home. Patient is a DNR, form in the home 1.  2. SOCIAL/EMOTIONAL/SPIRITUAL ASSESSMENT/ INTERVENTIONS:  SW completed telephonic visit with patient. SW spoke to patient directly and she provided a status update. Patient reported "I'm hanging in there". She reported that if she had not had the issues with her bladder she would be fine. She stated that she will see her physician in January and is hoping to have a good report. Patient report that her appetite remains poor noting " I eat enough to stay alive". She has had no falls. No recent emergency room visits, however she has had two emergency room visits in October. Her son lives with her and remains her PCG. She report intermittent generalized pain. Patient verbalized no concerns. SW scheduled a visit with patient for 1/4@2 :30pm. SW to provide ongoing psychosocial assessment and support through routine visits.  1. PATIENT/CAREGIVER EDUCATION/ COPING:  Patient report that she is coping well. She seemed to be in good spirits. She is alert and oriented to x3. Her children are very supportive. 3.  4. PERSONAL EMERGENCY PLAN:  911 can be activated for emergencies. 5. COMMUNITY RESOURCES COORDINATION/ HEALTH CARE NAVIGATION:  SW reinforced access to palliative care services. 6. FINANCIAL/LEGAL CONCERNS/INTERVENTIONS:  None.     SOCIAL HX:  Social History   Tobacco Use  . Smoking status: Former Smoker    Types: Cigarettes    Quit date: 10/31/1983    Years since quitting: 36.9  .  Smokeless tobacco: Never Used  Substance Use Topics  . Alcohol use: No    CODE STATUS: DNR ADVANCED DIRECTIVES: No MOST FORM COMPLETE:  No HOSPICE EDUCATION PROVIDED: No  PPS: Patient is alert and oriented to self x3. She continues to ambulate with a walker.  Patient is independent for personal care.   Duration of telephonic visit and documentation: 30 minutes      Katheren Puller, LCSW

## 2020-10-21 ENCOUNTER — Telehealth: Payer: Self-pay

## 2020-10-21 DIAGNOSIS — I10 Essential (primary) hypertension: Secondary | ICD-10-CM

## 2020-10-21 MED ORDER — METOPROLOL SUCCINATE ER 25 MG PO TB24: 75.0000 mg | ORAL_TABLET | Freq: Every day | ORAL | 3 refills | Status: DC

## 2020-10-21 NOTE — Telephone Encounter (Signed)
Pt's son calling to ask if we can send a new rx to pt's new pharmacy, as he called the old pharmacy and they told him to call office. Medication sent to new pharmacy today.

## 2020-10-27 DIAGNOSIS — I5042 Chronic combined systolic (congestive) and diastolic (congestive) heart failure: Secondary | ICD-10-CM | POA: Insufficient documentation

## 2020-10-27 DIAGNOSIS — N390 Urinary tract infection, site not specified: Secondary | ICD-10-CM | POA: Insufficient documentation

## 2020-10-27 DIAGNOSIS — R1032 Left lower quadrant pain: Secondary | ICD-10-CM | POA: Diagnosis present

## 2020-10-27 DIAGNOSIS — N321 Vesicointestinal fistula: Secondary | ICD-10-CM | POA: Diagnosis not present

## 2020-10-27 DIAGNOSIS — K632 Fistula of intestine: Secondary | ICD-10-CM | POA: Diagnosis not present

## 2020-10-27 DIAGNOSIS — E039 Hypothyroidism, unspecified: Secondary | ICD-10-CM | POA: Diagnosis not present

## 2020-10-27 DIAGNOSIS — Z87891 Personal history of nicotine dependence: Secondary | ICD-10-CM | POA: Diagnosis not present

## 2020-10-27 DIAGNOSIS — K5732 Diverticulitis of large intestine without perforation or abscess without bleeding: Secondary | ICD-10-CM | POA: Diagnosis not present

## 2020-10-27 DIAGNOSIS — R1084 Generalized abdominal pain: Secondary | ICD-10-CM | POA: Diagnosis not present

## 2020-10-27 DIAGNOSIS — Z7982 Long term (current) use of aspirin: Secondary | ICD-10-CM | POA: Insufficient documentation

## 2020-10-27 DIAGNOSIS — I110 Hypertensive heart disease with heart failure: Secondary | ICD-10-CM | POA: Insufficient documentation

## 2020-10-27 DIAGNOSIS — Z79899 Other long term (current) drug therapy: Secondary | ICD-10-CM | POA: Insufficient documentation

## 2020-10-27 DIAGNOSIS — R1030 Lower abdominal pain, unspecified: Secondary | ICD-10-CM | POA: Diagnosis not present

## 2020-10-27 DIAGNOSIS — K449 Diaphragmatic hernia without obstruction or gangrene: Secondary | ICD-10-CM | POA: Diagnosis not present

## 2020-10-28 ENCOUNTER — Encounter (HOSPITAL_COMMUNITY): Payer: Self-pay

## 2020-10-28 ENCOUNTER — Other Ambulatory Visit: Payer: Self-pay

## 2020-10-28 ENCOUNTER — Emergency Department (HOSPITAL_COMMUNITY)
Admission: EM | Admit: 2020-10-28 | Discharge: 2020-10-28 | Disposition: A | Discharge status: Home / Self Care | Payer: Medicare Other | Attending: Emergency Medicine | Admitting: Emergency Medicine

## 2020-10-28 ENCOUNTER — Emergency Department (HOSPITAL_COMMUNITY): Payer: Medicare Other

## 2020-10-28 DIAGNOSIS — K632 Fistula of intestine: Secondary | ICD-10-CM | POA: Diagnosis not present

## 2020-10-28 DIAGNOSIS — N390 Urinary tract infection, site not specified: Secondary | ICD-10-CM | POA: Diagnosis not present

## 2020-10-28 DIAGNOSIS — K449 Diaphragmatic hernia without obstruction or gangrene: Secondary | ICD-10-CM | POA: Diagnosis not present

## 2020-10-28 DIAGNOSIS — K5732 Diverticulitis of large intestine without perforation or abscess without bleeding: Secondary | ICD-10-CM | POA: Diagnosis not present

## 2020-10-28 DIAGNOSIS — N321 Vesicointestinal fistula: Secondary | ICD-10-CM | POA: Diagnosis not present

## 2020-10-28 LAB — CBC WITH DIFFERENTIAL/PLATELET
Abs Immature Granulocytes: 0.02 10*3/uL (ref 0.00–0.07)
Basophils Absolute: 0.1 10*3/uL (ref 0.0–0.1)
Basophils Relative: 1 %
Eosinophils Absolute: 0.4 10*3/uL (ref 0.0–0.5)
Eosinophils Relative: 4 %
HCT: 28.9 % — ABNORMAL LOW (ref 36.0–46.0)
Hemoglobin: 8.5 g/dL — ABNORMAL LOW (ref 12.0–15.0)
Immature Granulocytes: 0 %
Lymphocytes Relative: 19 %
Lymphs Abs: 1.6 10*3/uL (ref 0.7–4.0)
MCH: 24.4 pg — ABNORMAL LOW (ref 26.0–34.0)
MCHC: 29.4 g/dL — ABNORMAL LOW (ref 30.0–36.0)
MCV: 83 fL (ref 80.0–100.0)
Monocytes Absolute: 0.7 10*3/uL (ref 0.1–1.0)
Monocytes Relative: 8 %
Neutro Abs: 5.8 10*3/uL (ref 1.7–7.7)
Neutrophils Relative %: 68 %
Platelets: 277 10*3/uL (ref 150–400)
RBC: 3.48 MIL/uL — ABNORMAL LOW (ref 3.87–5.11)
RDW: 16.7 % — ABNORMAL HIGH (ref 11.5–15.5)
WBC: 8.6 10*3/uL (ref 4.0–10.5)
nRBC: 0 % (ref 0.0–0.2)

## 2020-10-28 LAB — URINALYSIS, ROUTINE W REFLEX MICROSCOPIC
Bilirubin Urine: NEGATIVE
Glucose, UA: NEGATIVE mg/dL
Hgb urine dipstick: NEGATIVE
Ketones, ur: NEGATIVE mg/dL
Nitrite: NEGATIVE
Protein, ur: 100 mg/dL — AB
Specific Gravity, Urine: 1.018 (ref 1.005–1.030)
WBC, UA: >50 WBC/hpf — ABNORMAL HIGH (ref 0–5)
pH: 5 (ref 5.0–8.0)

## 2020-10-28 LAB — COMPREHENSIVE METABOLIC PANEL
ALT: 9 U/L (ref 0–44)
AST: 16 U/L (ref 15–41)
Albumin: 3.6 g/dL (ref 3.5–5.0)
Alkaline Phosphatase: 94 U/L (ref 38–126)
Anion gap: 10 (ref 5–15)
BUN: 20 mg/dL (ref 8–23)
CO2: 23 mmol/L (ref 22–32)
Calcium: 8.7 mg/dL — ABNORMAL LOW (ref 8.9–10.3)
Chloride: 105 mmol/L (ref 98–111)
Creatinine, Ser: 1.42 mg/dL — ABNORMAL HIGH (ref 0.44–1.00)
GFR, Estimated: 35 mL/min — ABNORMAL LOW (ref >60)
Glucose, Bld: 122 mg/dL — ABNORMAL HIGH (ref 70–99)
Potassium: 4.1 mmol/L (ref 3.5–5.1)
Sodium: 138 mmol/L (ref 135–145)
Total Bilirubin: 0.4 mg/dL (ref 0.3–1.2)
Total Protein: 7.7 g/dL (ref 6.5–8.1)

## 2020-10-28 LAB — LIPASE, BLOOD: Lipase: 43 U/L (ref 11–51)

## 2020-10-28 MED ORDER — SODIUM CHLORIDE 0.9 % IV SOLN
1.0000 g | Freq: Once | INTRAVENOUS | Status: AC
  Administered 2020-10-28: 07:00:00 1 g via INTRAVENOUS
  Filled 2020-10-28: qty 10

## 2020-10-28 MED ORDER — ONDANSETRON HCL 4 MG/2ML IJ SOLN
4.0000 mg | Freq: Once | INTRAMUSCULAR | Status: AC
  Administered 2020-10-28: 01:00:00 4 mg via INTRAVENOUS
  Filled 2020-10-28: qty 2

## 2020-10-28 MED ORDER — CEPHALEXIN 500 MG PO CAPS: 500.0000 mg | ORAL_CAPSULE | Freq: Three times a day (TID) | ORAL | 0 refills | Status: AC

## 2020-10-28 MED ORDER — ONDANSETRON 4 MG PO TBDP: 4.0000 mg | ORAL_TABLET | Freq: Three times a day (TID) | ORAL | 0 refills | Status: AC | PRN

## 2020-10-28 MED ORDER — SODIUM CHLORIDE 0.9 % IV BOLUS
1000.0000 mL | Freq: Once | INTRAVENOUS | Status: AC
  Administered 2020-10-28: 01:00:00 1000 mL via INTRAVENOUS

## 2020-10-28 MED ORDER — FENTANYL CITRATE (PF) 100 MCG/2ML IJ SOLN
50.0000 ug | Freq: Once | INTRAMUSCULAR | Status: AC
  Administered 2020-10-28: 01:00:00 50 ug via INTRAVENOUS
  Filled 2020-10-28: qty 2

## 2020-10-28 NOTE — ED Triage Notes (Signed)
Pt reports recurrent bladder pain.Pt has a fecal fistula placed and has small intestine matter that drains into bladder.

## 2020-10-28 NOTE — ED Provider Notes (Signed)
[facility] DEPT Provider Note  CSN: [id number] Arrival date & time: 10/27/20 2354  Chief Complaint(s) Abdominal Pain  HPI [name] is a 86 y.o. female with a past medical history listed below including chronic diverticulitis resulting in a colovesicular fistula leading to recurring urinary tract infections who presents to the emergency department with several days of left lower quadrant abdominal and suprapubic discomfort similar to prior presentations.  Patient denies any fevers or chills.  No nausea or vomiting.  Pain palpation and movement.  No change in bowel habits.  No bloody bowel movements.  No change in urinary symptoms.  No other physical complaints.  HPI  Past Medical History Past Medical History:  Diagnosis Date  . A-fib ([place])   . Acute CHF (congestive heart failure) ([place]) 09/02/2019  . Aortic stenosis, moderate    last echo in 2010; AVA 1.1; mild AS per echo in June 2013  . Edema of both legs    s/p laser treatment per Dr. Donnetta Hutching  . GAD (generalized anxiety disorder) 05/20/2020  . Hiatal hernia   . History of chicken pox   . HLD (hyperlipidemia)   . HTN (hypertension)   . Hypothyroidism   . IBS (irritable bowel syndrome)   . Iron deficiency 09/19/2017  . Iron deficiency anemia   . Iron deficiency anemia 02/22/2020  . Localized edema 06/21/2012   Overview:  Vascular surgeon - Dr Donnetta Hutching  . Neuropathic pain 09/06/2015  . Rheumatoid arthritis [facility])    Patient Active Problem List   Diagnosis Date Noted  . Abdominal pain 08/13/2020  . Hiatal hernia   . Abnormal CT of the abdomen   . Chronic combined systolic and diastolic congestive heart failure ([place]) 04/21/2020  . Hypoxia 04/21/2020  . Metabolic acidosis [id number]  . Cystitis 02/10/2020  . Aortic atherosclerosis ([place]) 09/16/2019  . Complicated UTI (urinary tract infection) 08/21/2019  . Colovesical fistula 05/28/2019  . Polyneuropathy 11/06/2018  . Goals of care,  counseling/discussion   . Hydronephrosis 10/13/2018  . Ovarian mass 10/13/2018  . C. difficile colitis 10/13/2018  . Clostridium difficile colitis 09/24/2018  . Hypomagnesemia   . Left ovarian cyst   . Acute diverticulitis 09/07/2018  . Malnutrition of moderate degree 07/26/2018  . Perforation of sigmoid colon due to diverticulitis 07/25/2018  . Sepsis ([place]) 07/24/2018  . Generalized abdominal pain 02/06/2018  . Paresthesia of both lower extremities 02/06/2018  . Anemia 01/07/2018  . Increased platelet count 09/18/2017  . Pyoderma gangrenosum 01/04/2017  . Primary osteoarthritis of both hands 01/04/2017  . Primary osteoarthritis of both feet 01/04/2017  . Primary osteoarthritis of both knees 01/04/2017  . Degenerative joint disease involving multiple joints 01/04/2017  . Neuropathic pain 09/06/2015  . Erythema nodosum 09/21/2014  . Rheumatoid arthritis ([place]) 09/21/2014  . Benign essential hypertension 09/02/2014  . Rectal bleeding 05/15/2014  . Persistent atrial fibrillation ([place]) 05/11/2014  . Hyperlipidemia 04/02/2014  . PAC (premature atrial contraction) 01/23/2014  . Varicose veins of lower extremities with ulcer, unspecified laterality ([place]) 07/23/2012  . Hypothyroid 06/21/2012  . Hypertension 04/19/2012  . Edema of lower extremity 01/17/2012  . Anxiety 11/05/2011  . Aortic stenosis 03/22/2011   Home Medication(s) Prior to Admission medications   Medication Sig Start Date End Date Taking? Authorizing Provider  cephALEXin (KEFLEX) 500 MG capsule Take 1 capsule (500 mg total) by mouth 3 (three) times daily for 10 days. 10/28/20 11/07/20 Yes Linetta Regner, Grayce Sessions, MD  ondansetron (ZOFRAN ODT) 4 MG disintegrating tablet Take 1  tablet (4 mg total) by mouth every 8 (eight) hours as needed for up to 3 days for nausea or vomiting. 10/28/20 10/31/20 Yes Raynell Upton, Grayce Sessions, MD  acetaminophen (TYLENOL) 500 MG tablet Take 1,000 mg by mouth every 8 (eight) hours as needed for  moderate pain (joint pain).     [provider]  ALPRAZolam Duanne Moron) 0.25 MG tablet Take 1 tablet (0.25 mg total) by mouth at bedtime as needed for anxiety. FOR ANXIETY/RESTLESS LEGS 10/22/20   Nche, Charlene Brooke, NP  aspirin 81 MG chewable tablet Chew 1 tablet (81 mg total) by mouth daily. 10/26/18   Hennie Duos, MD  ferrous gluconate (FERGON) 324 MG tablet Take 1 tablet (324 mg total) by mouth 2 (two) times daily with a meal. 09/07/20   Nche, Charlene Brooke, NP  fosfomycin (MONUROL) 3 g PACK Take 3 g by mouth once. Repeat in 10 days if needed. 07/26/20   [provider]  furosemide (LASIX) 20 MG tablet Take 1 tablet (20 mg total) by mouth daily as needed. Patient not taking: Reported on 08/12/2020 05/20/20   Nche, Charlene Brooke, NP  gabapentin (NEURONTIN) 100 MG capsule Take 1 capsule (100 mg total) by mouth daily. 09/30/20   Nche, Charlene Brooke, NP  HYDROcodone-acetaminophen (NORCO/VICODIN) 5-325 MG tablet Take 1 tablet by mouth every 6 (six) hours as needed for moderate pain.  07/30/20   [provider]  levothyroxine (SYNTHROID) 50 MCG tablet Take 1 tablet (50 mcg total) by mouth daily before breakfast. 09/07/20   Nche, Charlene Brooke, NP  metoprolol succinate (TOPROL-XL) 25 MG 24 hr tablet Take 3 tablets (75 mg total) by mouth daily. 10/21/20   Nche, Charlene Brooke, NP  Olopatadine HCl 0.2 % SOLN Place 1 drop into both eyes daily. Patient taking differently: Place 1 drop into both eyes in the morning, at noon, in the evening, and at bedtime.  02/25/20   Granville Lewis C, PA-C  pantoprazole (PROTONIX) 40 MG tablet Take 1 tablet (40 mg total) by mouth daily at 6 (six) AM. 08/14/20   Eugenie Filler, MD  potassium chloride SA (KLOR-CON) 20 MEQ tablet Take 1 tablet (20 mEq total) by mouth daily as needed. Patient taking differently: Take 20 mEq by mouth daily.  05/20/20   Nche, Charlene Brooke, NP  Pyridoxine HCl (VITAMIN B-6 PO) Take 1 tablet by mouth daily.    [provider]                                                                                                                                    Past Surgical History Past Surgical History:  Procedure Laterality Date  . APPENDECTOMY  1955  . CARPAL TUNNEL RELEASE Right   . CATARACT EXTRACTION    . ENDOSCOPIC VEIN LASER TREATMENT    . ENDOVENOUS ABLATION SAPHENOUS VEIN W/ LASER  08-29-2012   left greater saphenous vein   Curt Jews  MD  . ENDOVENOUS ABLATION SAPHENOUS VEIN W/ LASER  09-19-2012   right greater saphenous vein by Gretta Began MD  . EYE SURGERY  2005   Bilateral cataract  . FOOT SURGERY  2003   bilateral Hammer Toe  . stab phlebectomy Right 01-02-2013   10-15 incisions right thigh and calf by Gretta Began MD   Family History Family History  Problem Relation Age of Onset  . Heart disease Mother   . Peripheral vascular disease Mother   . Heart disease Father   . Peripheral vascular disease Father        Right leg amputation  . COPD Father   . Heart disease Brother 48       Heart Disease before age 9  . Heart attack Brother   . Peripheral vascular disease Other   . Hypertension Sister     Social History Social History   Tobacco Use  . Smoking status: Former Smoker    Types: Cigarettes    Quit date: 10/31/1983    Years since quitting: 37.0  . Smokeless tobacco: Never Used  Vaping Use  . Vaping Use: Never used  Substance Use Topics  . Alcohol use: No  . Drug use: No   Allergies Infliximab, Aspirin, Carvedilol, Codeine, Contrast media [iodinated diagnostic agents], and Penicillins  Review of Systems Review of Systems All other systems are reviewed and are negative for acute change except as noted in the HPI  Physical Exam Vital Signs  I have reviewed the triage vital signs BP (!) 147/76   Pulse 72   Temp 99.6 F (37.6 C) (Oral)   Resp 16   SpO2 97%   Physical Exam Vitals reviewed.  Constitutional:      General: She is not in acute distress.     Appearance: She is well-developed and well-nourished. She is not diaphoretic.  HENT:     Head: Normocephalic and atraumatic.     Nose: Nose normal.  Eyes:     General: No scleral icterus.       Right eye: No discharge.        Left eye: No discharge.     Extraocular Movements: EOM normal.     Conjunctiva/sclera: Conjunctivae normal.     Pupils: Pupils are equal, round, and reactive to light.  Cardiovascular:     Rate and Rhythm: Normal rate and regular rhythm.     Heart sounds: No murmur heard. No friction rub. No gallop.   Pulmonary:     Effort: Pulmonary effort is normal. No respiratory distress.     Breath sounds: Normal breath sounds. No stridor. No rales.  Abdominal:     General: There is no distension.     Palpations: Abdomen is soft.     Tenderness: There is abdominal tenderness in the left lower quadrant.  Musculoskeletal:        General: No tenderness or edema.     Cervical back: Normal range of motion and neck supple.  Skin:    General: Skin is warm and dry.     Findings: No erythema or rash.  Neurological:     Mental Status: She is alert and oriented to person, place, and time.  Psychiatric:        Mood and Affect: Mood and affect normal.     ED Results and Treatments Labs (all labs ordered are listed, but only abnormal results are displayed) Labs Reviewed  CBC WITH DIFFERENTIAL/PLATELET - Abnormal; Notable for the following components:  Result Value   RBC 3.48 (*)    Hemoglobin 8.5 (*)    HCT 28.9 (*)    MCH 24.4 (*)    MCHC 29.4 (*)    RDW 16.7 (*)    All other components within normal limits  COMPREHENSIVE METABOLIC PANEL - Abnormal; Notable for the following components:   Glucose, Bld 122 (*)    Creatinine, Ser 1.42 (*)    Calcium 8.7 (*)    GFR, Estimated 35 (*)    All other components within normal limits  URINALYSIS, ROUTINE W REFLEX MICROSCOPIC - Abnormal; Notable for the following components:   APPearance HAZY (*)    Protein, ur 100 (*)     Leukocytes,Ua LARGE (*)    WBC, UA >50 (*)    Bacteria, UA RARE (*)    All other components within normal limits  URINE CULTURE  LIPASE, BLOOD                                                                                                                         EKG  EKG Interpretation  Date/Time:    Ventricular Rate:    PR Interval:    QRS Duration:   QT Interval:    QTC Calculation:   R Axis:     Text Interpretation:        Radiology CT ABDOMEN PELVIS WO CONTRAST  Result Date: 10/28/2020 CLINICAL DATA:  86 year old female with left lower quadrant abdominal pain, recurrent bladder pain, known enterovesical fistula. EXAM: CT ABDOMEN AND PELVIS WITHOUT CONTRAST TECHNIQUE: Multidetector CT imaging of the abdomen and pelvis was performed following the standard protocol without IV contrast. COMPARISON:  CT Abdomen and Pelvis 08/12/2020 and earlier. FINDINGS: Lower chest: Stable chronic hiatal hernia with mostly intrathoracic stomach. No pericardial effusion. Trace layering pleural fluid is stable. No evidence of lung base infection. Hepatobiliary: Stable noncontrast liver. Partially contracted gallbladder today. No pericholecystic inflammation. Pancreas: Negative. Spleen: Negative. Adrenals/Urinary Tract: Adrenal glands remain within normal limits. Stable and negative noncontrast right kidney. Chronic left hydronephrosis and hydroureter to the level of chronic multi cystic enlargement of the left adnexa, further described below. Distal to the left adnexa the distal most left ureter appears decompressed on series 2, image 59. No superimposed urinary calculus identified. Chronic colovesical fistula with abnormal wall thickening along the left bladder dome and moderate volume of gas redemonstrated within the bladder (series 2, image 61). Stomach/Bowel: Persistent abnormal bowel wall thickening throughout the sigmoid colon. Chronic sigmoid colovesical fistula (coronal image 39 and series 2, image  61. Upstream retained stool throughout redundant large bowel. But no dilated large or small bowel loops. Extensive diverticulosis also from the right colon to the descending colon. No new areas of large bowel inflammation. Appendix not delineated, but no pericecal inflammation. No dilated or inflamed small bowel. Chronic mostly intrathoracic stomach. No free air.  No free fluid. Vascular/Lymphatic: Vascular patency is not evaluated in the absence of IV contrast. Aortoiliac calcified atherosclerosis. Normal caliber  abdominal aorta. Reproductive: Stable noncontrast appearance, chronic multi-cystic changes to the left ovary measuring 6 cm, with areas of simple fluid density, stable since 2019. Other: No pelvic free fluid. Chronic pericolonic mesenteric stranding in the pelvis is stable. Musculoskeletal: Stable.  No acute osseous abnormality identified. IMPRESSION: 1. No new abnormality identified in the abdomen or pelvis. 2. Chronic Colovesical Fistula as a complication of sigmoid diverticulitis. Extensive underlying large bowel diverticula. 3. Chronic left urinary obstruction at the level of the distal left ureter, either secondary to #2 and/or due to chronic multicystic enlargement of the left ovary - all not significantly changed since 2019. 4. Chronic large hiatal hernia/intrathoracic stomach. 5.  Aortic Atherosclerosis (ICD10-I70.0). Electronically Signed   By: Odessa Fleming M.D.   On: 10/28/2020 04:57    Pertinent labs & imaging results that were available during my care of the patient were reviewed by me and considered in my medical decision making (see chart for details).  Medications Ordered in ED Medications  ondansetron (ZOFRAN) injection 4 mg (4 mg Intravenous Given 10/28/20 0056)  sodium chloride 0.9 % bolus 1,000 mL (0 mLs Intravenous Stopped 10/28/20 0310)  fentaNYL (SUBLIMAZE) injection 50 mcg (50 mcg Intravenous Given 10/28/20 0057)  cefTRIAXone (ROCEPHIN) 1 g in sodium chloride 0.9 % 100 mL IVPB  (1 g Intravenous New Bag/Given 10/28/20 0644)                                                                                                                                    Procedures Procedures  (including critical care time)  Medical Decision Making / ED Course I have reviewed the nursing notes for this encounter and the patient's prior records (if available in EHR or on provided paperwork).   [name] was evaluated in Emergency Department on 10/28/2020 for the symptoms described in the history of present illness. She was evaluated in the context of the global COVID-19 pandemic, which necessitated consideration that the patient might be at risk for infection with the SARS-CoV-2 virus that causes COVID-19. Institutional protocols and algorithms that pertain to the evaluation of patients at risk for COVID-19 are in a state of rapid change based on information released by regulatory bodies including the CDC and federal and state organizations. These policies and algorithms were followed during the patient's care in the ED.  Left lower quadrant abdominal discomfort and tenderness to palpation. Past medical history as above. Labs reassuring without leukocytosis.  Stable hemoglobin. No significant electrolyte derangements. Mild renal insufficiency. UA similar to before.  Patient has grown out Klebsiella from most recent UA in October that was resistant only to ampicillin.  At that time he was treated with Keflex.  CT obtained and revealed no acute changes.  Will treat with IV rocephin and Rx of keflex.  Feel she is appropriate for OP management with strict return precautions.      Final Clinical Impression(s) / ED Diagnoses Final  diagnoses:  Lower urinary tract infectious disease    The patient appears reasonably screened and/or stabilized for discharge and I doubt any other medical condition or other [facility] requiring further screening, evaluation, or treatment in the ED at  this time prior to discharge. Safe for discharge with strict return precautions.  Disposition: Discharge  Condition: Good  I have discussed the results, Dx and Tx plan with the patient/family who expressed understanding and agree(s) with the plan. Discharge instructions discussed at length. The patient/family was given strict return precautions who verbalized understanding of the instructions. No further questions at time of discharge.    ED Discharge Orders         Ordered    cephALEXin (KEFLEX) 500 MG capsule  3 times daily        10/28/20 0705    ondansetron (ZOFRAN ODT) 4 MG disintegrating tablet  Every 8 hours PRN        10/28/20 [id number]           Follow Up: [name], NP [place] [address] [phone]  Call  To schedule an appointment for close follow up     This chart was dictated using voice recognition software.  Despite best efforts to proofread,  errors can occur which can change the documentation meaning.   Fatima Blank, MD 10/28/20 [phone]

## 2020-10-30 LAB — URINE CULTURE: Culture: 50000 — AB

## 2020-10-31 ENCOUNTER — Telehealth (HOSPITAL_BASED_OUTPATIENT_CLINIC_OR_DEPARTMENT_OTHER): Payer: Self-pay | Admitting: Emergency Medicine

## 2020-10-31 ENCOUNTER — Encounter (HOSPITAL_COMMUNITY): Payer: Self-pay | Admitting: Emergency Medicine

## 2020-10-31 NOTE — Telephone Encounter (Signed)
Post ED Visit - Positive Culture Follow-up  Culture report reviewed by antimicrobial stewardship pharmacist: [facility] []  , Pharm.D. []  Enzo Bi, Pharm.D., BCPS AQ-ID []  , Pharm.D., BCPS []  Celedonio Miyamoto, Pharm.D., BCPS []  Tynan, Garvin Fila.D., BCPS, AAHIVP []  , Pharm.D., BCPS, AAHIVP []  Georgina Pillion, PharmD, BCPS []  , PharmD, BCPS []  [place], PharmD, BCPS []  [address], PharmD []  , PharmD, BCPS []  Estella Husk, PharmD  Pharmacy Team []  Lysle Pearl, PharmD []  , PharmD []  Phillips Climes, PharmD []  , Rph []  Agapito Games) , PharmD [x]  Verlan Friends, PharmD []  , PharmD []  Mervyn Gay, PharmD []  , PharmD []  Vinnie Level, PharmD []  [facility], PharmD []  , PharmD []  Len Childs, PharmD   Positive urine culture Treated with Cephalexin, organism sensitive to the same and no further patient follow-up is required at this time.  10/31/2020, 4:46 PM

## 2020-11-02 ENCOUNTER — Other Ambulatory Visit: Payer: Medicare Other

## 2020-11-02 ENCOUNTER — Other Ambulatory Visit: Payer: Self-pay

## 2020-11-02 DIAGNOSIS — Z515 Encounter for palliative care: Secondary | ICD-10-CM

## 2020-11-17 ENCOUNTER — Encounter: Payer: Self-pay | Admitting: *Deleted

## 2020-11-22 ENCOUNTER — Ambulatory Visit: Payer: Medicare Other | Admitting: Internal Medicine

## 2020-11-24 ENCOUNTER — Telehealth: Payer: Self-pay

## 2020-11-24 NOTE — Telephone Encounter (Signed)
11:07am-SW returned call to patient's son-[name]. [name] advised that patient is declining cognitively and physically and she needs assistance with personal care. SW explained that palliative care does not provide these services, but she could assist him by giving him resources for private paid agencies. [name] stated that he was not aware of this, but wanted to talk to patient's doctor for more direction. SW provided education regarding hospice and the services available under that program. He stated that patient really needed more personal care services. He stated that he will follow-up with SW following patient's visit with her physician and he is able to have a private conversation with the physician.  No other concerns.

## 2020-11-29 NOTE — Progress Notes (Unsigned)
COMMUNITY PALLIATIVE CARE SW NOTE  PATIENT NAME: [name] DOB: May 03, 1927 MRN: [id number]  PRIMARY CARE PROVIDER: [name], NP  RESPONSIBLE PARTY:  Acct ID - Guarantor Home Phone Work Phone Relationship Acct Type  [id number] - [place],[name] [phone]  Self P/F     [place], [place] [address]     PLAN OF CARE and INTERVENTIONS:             1. GOALS OF CARE/ ADVANCE CARE PLANNING: Goal is for patient to remain in her home. Patient is a DNR, form in the home. 1.  1.  2. SOCIAL/EMOTIONAL/SPIRITUAL ASSESSMENT/: SW completed a face-to-face visit with patient at her home. She was present with her sonLegrand Como. Patient was sitting on the couch. She reported that her condition continues to decline. She had an emergency room visit on 12/30. She report that she continue to have pain and discomfort related to her bladder, but understand that treatment options are limited. Her appetite remains fair. She has not had any falls. Patient was in a good mood, she was engaged in life review. Her son-[name] verbalized that patient remains independent for personal care needs. He walks with her when she ambulates to ensure she does not fall. The main issue for her is the generalized pain. SW to provide ongoing psychosocial assessment and support through routine visits.  1.  3.  INTERVENTIONS:PATIENT/CAREGIVER EDUCATION/ COPING: Patient report that she is coping well. She seemed to be in good spirits. She is alert and oriented to x3. Her children are very supportive. 3.  2.  3. PERSONAL EMERGENCY PLAN:    911 can be activated for emergencies. 4.  3. COMMUNITY RESOURCES COORDINATION/ HEALTH CARE NAVIGATION:  SW reinforced access to palliative care services 5.  6. FINANCIAL/LEGAL CONCERNS/INTERVENTIONS:  None.     SOCIAL HX:  Social History   Tobacco Use  . Smoking status: Former Smoker    Types: Cigarettes    Quit date: 10/31/1983    Years since quitting: 37.1   . Smokeless tobacco: Never Used  Substance Use Topics  . Alcohol use: No    CODE STATUS: DNR ADVANCED DIRECTIVES: No MOST FORM COMPLETE:  No HOSPICE EDUCATION PROVIDED: No  PPS: Patient is alert and oriented to self x3. She continues to ambulate with a walker.  Patient is independent for personal care.  Duration of visit and documentation: 60 minutes      Katheren Puller, LCSW

## 2020-12-03 ENCOUNTER — Ambulatory Visit: Payer: Medicare Other | Admitting: Nurse Practitioner

## 2020-12-06 ENCOUNTER — Telehealth: Payer: Self-pay

## 2020-12-06 ENCOUNTER — Telehealth: Payer: Self-pay | Admitting: Nurse Practitioner

## 2020-12-06 NOTE — Telephone Encounter (Signed)
Pt returning a call from [place] and would like a call back.  Please advise.  CB#[phone]

## 2020-12-06 NOTE — Telephone Encounter (Signed)
Pt son called and said that he believes the metoprolol succinate (TOPROL-XL) 25 MG 24 hr tablet is making the pt sick and that the pharmacy recommended changing the dosage or changing prescription. Please advise

## 2020-12-06 NOTE — Telephone Encounter (Signed)
Schedule video appt to discuss her symptoms. Thank you

## 2020-12-06 NOTE — Telephone Encounter (Signed)
LVM for pt to return call and schedule virtual appointment.

## 2020-12-07 NOTE — Telephone Encounter (Signed)
Appointment was needed and appointment has been scheduled for 12/16/20

## 2020-12-09 ENCOUNTER — Encounter: Payer: Self-pay | Admitting: Nurse Practitioner

## 2020-12-09 ENCOUNTER — Telehealth (INDEPENDENT_AMBULATORY_CARE_PROVIDER_SITE_OTHER): Payer: Medicare Other | Admitting: Nurse Practitioner

## 2020-12-09 VITALS — BP 159/79 | HR 73 | Ht 60.0 in

## 2020-12-09 DIAGNOSIS — K5792 Diverticulitis of intestine, part unspecified, without perforation or abscess without bleeding: Secondary | ICD-10-CM

## 2020-12-09 DIAGNOSIS — I10 Essential (primary) hypertension: Secondary | ICD-10-CM

## 2020-12-09 MED ORDER — CHOLESTYRAMINE 4 G PO PACK
4.0000 g | PACK | Freq: Two times a day (BID) | ORAL | 0 refills | Status: DC
Start: 2020-12-09 — End: 2021-08-15

## 2020-12-09 MED ORDER — DICYCLOMINE HCL 10 MG PO CAPS: 10.0000 mg | ORAL_CAPSULE | Freq: Three times a day (TID) | ORAL | 0 refills | Status: DC

## 2020-12-09 MED ORDER — METRONIDAZOLE 500 MG PO TABS: 500.0000 mg | ORAL_TABLET | Freq: Three times a day (TID) | ORAL | 0 refills | Status: DC

## 2020-12-09 NOTE — Patient Instructions (Addendum)
Diarrhea is due to recurrent diverticulitis and not metoprolol Maintain adequate oral hydration and BRAT diet Maintain metoprolol dose Start medications above. Go to ED if symptoms worsen Call office if symptoms no improvement in 1week.    Diarrhea, Adult Diarrhea is frequent loose and watery bowel movements. Diarrhea can make you feel weak and cause you to become dehydrated. Dehydration can make you tired and thirsty, cause you to have a dry mouth, and decrease how often you urinate. Diarrhea typically lasts 2-3 days. However, it can last longer if it is a sign of something more serious. It is important to treat your diarrhea as told by your health care provider. Follow these instructions at home: Eating and drinking Follow these recommendations as told by your health care provider:  Take an oral rehydration solution (ORS). This is an over-the-counter medicine that helps return your body to its normal balance of nutrients and water. It is found at pharmacies and retail stores.  Drink plenty of fluids, such as water, ice chips, diluted fruit juice, and low-calorie sports drinks. You can drink milk also, if desired.  Avoid drinking fluids that contain a lot of sugar or caffeine, such as energy drinks, sports drinks, and soda.  Eat bland, easy-to-digest foods in small amounts as you are able. These foods include bananas, applesauce, rice, lean meats, toast, and crackers.  Avoid alcohol.  Avoid spicy or fatty foods.      Medicines  Take over-the-counter and prescription medicines only as told by your health care provider.  If you were prescribed an antibiotic medicine, take it as told by your health care provider. Do not stop using the antibiotic even if you start to feel better. General instructions  Wash your hands often using soap and water. If soap and water are not available, use a hand sanitizer. Others in the household should wash their hands as well. Hands should be  washed: ? After using the toilet or changing a diaper. ? Before preparing, cooking, or serving food. ? While caring for a sick person or while visiting someone in a hospital.  Drink enough fluid to keep your urine pale yellow.  Rest at home while you recover.  Watch your condition for any changes.  Take a warm bath to relieve any burning or pain from frequent diarrhea episodes.  Keep all follow-up visits as told by your health care provider. This is important.   Contact a health care provider if:  You have a fever.  Your diarrhea gets worse.  You have new symptoms.  You cannot keep fluids down.  You feel light-headed or dizzy.  You have a headache.  You have muscle cramps. Get help right away if:  You have chest pain.  You feel extremely weak or you faint.  You have bloody or black stools or stools that look like tar.  You have severe pain, cramping, or bloating in your abdomen.  You have trouble breathing or you are breathing very quickly.  Your heart is beating very quickly.  Your skin feels cold and clammy.  You feel confused.  You have signs of dehydration, such as: ? Dark urine, very little urine, or no urine. ? Cracked lips. ? Dry mouth. ? Sunken eyes. ? Sleepiness. ? Weakness. Summary  Diarrhea is frequent loose and watery bowel movements. Diarrhea can make you feel weak and cause you to become dehydrated.  Drink enough fluids to keep your urine pale yellow.  Make sure that you wash your hands after using  the toilet. If soap and water are not available, use hand sanitizer.  Contact a health care provider if your diarrhea gets worse or you have new symptoms.  Get help right away if you have signs of dehydration. This information is not intended to replace advice given to you by your health care provider. Make sure you discuss any questions you have with your health care provider. Document Revised: 03/04/2019 Document Reviewed:  03/22/2018 Elsevier Patient Education  2021 [employer].

## 2020-12-09 NOTE — Progress Notes (Signed)
Virtual Visit via Telephone Note  I connected with@ on 12/09/20 at  8:15 AM EST by a telephone enabled telemedicine application and verified that I am speaking with the correct person using two identifiers. Interactive audio and video telecommunications were attempted between this provider and patient, however failed, due to patient having technical difficulties OR patient did not have access to video capability.  We continued and completed visit with audio only.   Location: Patient:Home Provider: Office Participants: son-[name], patient and provider  I discussed the limitations of evaluation and management by telemedicine and the availability of in person appointments. I also discussed with the patient that there may be a patient responsible charge related to this service. The patient expressed understanding and agreed to proceed.  QP:[id number] x 3days, she thinks it might be due to metoprolol  History of Present Illness: Diarrhea  This is a recurrent problem. The current episode started in the past 7 days. The problem occurs 2 to 4 times per day. The problem has been unchanged. The stool consistency is described as watery. The patient states that diarrhea does not awaken her from sleep. Associated symptoms include abdominal pain and bloating. Pertinent negatives include no arthralgias, chills, coughing, fever, headaches, increased  flatus, myalgias, sweats, URI, vomiting or weight loss. Nothing aggravates the symptoms. Risk factors include recent antibiotic use (keflex used to treat recurrent UTI in december). She has tried nothing for the symptoms. Her past medical history is significant for irritable bowel syndrome.  onset of diarrhea after starting new prescription bottle of metoprolol. no change in metoprolol dose or pharmaceutical company. Hx of diverticulitis with fistula. No blood in stool.  Observations/Objective: Alert and oriented x 4 Normal speech Unable to connect via video  call  Assessment and Plan: Shirlean was seen today for diarrhea.  Diagnoses and all orders for this visit:  Acute diverticulitis -     cholestyramine (QUESTRAN) 4 g packet; Take 1 packet (4 g total) by mouth 2 (two) times daily. -     metroNIDAZOLE (FLAGYL) 500 MG tablet; Take 1 tablet (500 mg total) by mouth 3 (three) times daily. -     dicyclomine (BENTYL) 10 MG capsule; Take 1 capsule (10 mg total) by mouth 3 (three) times daily before meals.  Benign essential hypertension   Follow Up Instructions: Diarrhea is due to recurrent diverticulitis and not metoprolol Maintain adequate oral hydration and BRAT diet Maintain metoprolol dose Start medications above. Go to ED if symptoms worsen Call office if symptoms no improvement in 1week.   I discussed the assessment and treatment plan with the patient. The patient was provided an opportunity to ask questions and all were answered. The patient agreed with the plan and demonstrated an understanding of the instructions.   The patient was advised to call back or seek an in-person evaluation if the symptoms worsen or if the condition fails to improve as anticipated.  I provided 15 minutes of non-face-to-face time during this encounter.  Wilfred Lacy, NP

## 2020-12-13 ENCOUNTER — Other Ambulatory Visit: Payer: Medicare Other

## 2020-12-13 ENCOUNTER — Other Ambulatory Visit: Payer: Self-pay

## 2020-12-13 DIAGNOSIS — Z515 Encounter for palliative care: Secondary | ICD-10-CM

## 2020-12-14 NOTE — Progress Notes (Signed)
COMMUNITY PALLIATIVE CARE SW NOTE  PATIENT NAME: [name] DOB: 11/18/1926 MRN: [id number]  PRIMARY CARE PROVIDER: [name], NP  RESPONSIBLE PARTY:  Acct ID - Guarantor Home Phone Work Phone Relationship Acct Type  [id number] - [name],[name] [phone]  Self P/F     [place], [place] [address]     PLAN OF CARE and INTERVENTIONS:             1. GOALS OF CARE/ ADVANCE CARE PLANNING:  Goal is for patient to remain in her home. Patient is a DNR, form in the home 2. SOCIAL/EMOTIONAL/SPIRITUAL ASSESSMENT/ INTERVENTIONS:  SW completed a Sales executive with patient's PCG/son- [name]. [name] reported that patient continues to have intermittent confusion, particularly in the morning. As the day goes her cognitive state improves. Patient continues to complete her own personal care. She is eating two good meals with snacks in the between. Patient had a virtual physician visit that went very well. SW follow-up on PCG's previous inquiry regarding getting paid sitters. PCG advised that he will revisit this at another time. He wants patient to continue to do as much as she can for herself while she is able to. Patient continues to have generalized pain that is managed with her pain medication.  SW to provide ongoing psychosocial assessment and support through routine visits. 3. PATIENT/CAREGIVER EDUCATION/ COPING:  Patientreport that she iscoping well. She seemed to be ingood spirits. She is alert and oriented to x3. Her children are very supportive.  PERSONAL EMERGENCY PLAN:   911 can be activated for support. COMMUNITY RESOURCES COORDINATION/ HEALTH CARE NAVIGATION:  PCG remains open to palliative care support.  FINANCIAL/LEGAL CONCERNS/INTERVENTIONS:  None     SOCIAL HX:  Social History   Tobacco Use  . Smoking status: Former Smoker    Types: Cigarettes    Quit date: 10/31/1983    Years since quitting: 37.1  . Smokeless tobacco: Never Used   Substance Use Topics  . Alcohol use: No    CODE STATUS: DNR ADVANCED DIRECTIVES: None MOST FORM COMPLETE:  No HOSPICE EDUCATION PROVIDED: No  PPS: Patient is alert and oriented to self x3. Shecontinues toambulate with a walker.Patient is independent for personal care.  Duration of telephonic visit and documentation: 30 minutes      Katheren Puller, LCSW

## 2020-12-16 ENCOUNTER — Ambulatory Visit: Payer: Medicare Other | Admitting: Nurse Practitioner

## 2020-12-22 DIAGNOSIS — H53413 Scotoma involving central area, bilateral: Secondary | ICD-10-CM | POA: Diagnosis not present

## 2020-12-22 DIAGNOSIS — H3562 Retinal hemorrhage, left eye: Secondary | ICD-10-CM | POA: Diagnosis not present

## 2020-12-22 DIAGNOSIS — H16223 Keratoconjunctivitis sicca, not specified as Sjogren's, bilateral: Secondary | ICD-10-CM | POA: Diagnosis not present

## 2020-12-22 DIAGNOSIS — H3530 Unspecified macular degeneration: Secondary | ICD-10-CM | POA: Diagnosis not present

## 2020-12-22 DIAGNOSIS — H35033 Hypertensive retinopathy, bilateral: Secondary | ICD-10-CM | POA: Diagnosis not present

## 2021-01-06 ENCOUNTER — Telehealth: Payer: Self-pay

## 2021-01-06 NOTE — Telephone Encounter (Signed)
Pt called requesting a call back regarding not having a caretaker for a few weeks. She wanted to see if [name] could send a referral somewhere for help.  Please advise Thanks

## 2021-01-10 NOTE — Telephone Encounter (Signed)
LVM informing patient to call our office so I can get further details.

## 2021-01-10 NOTE — Telephone Encounter (Signed)
Which ADLs does she need help with at home?

## 2021-01-17 ENCOUNTER — Telehealth: Payer: Self-pay

## 2021-01-17 NOTE — Telephone Encounter (Signed)
(  11:25am)  SW scheduled a visit with patient for 3/23 @ 2 pm.

## 2021-01-19 ENCOUNTER — Other Ambulatory Visit: Payer: Self-pay

## 2021-01-19 ENCOUNTER — Other Ambulatory Visit: Payer: Medicare Other | Admitting: *Deleted

## 2021-01-19 DIAGNOSIS — Z515 Encounter for palliative care: Secondary | ICD-10-CM

## 2021-01-19 NOTE — Progress Notes (Signed)
COMMUNITY PALLIATIVE CARE RN NOTE  PATIENT NAME: [name] DOB: [birth year] MRN: [id number]  PRIMARY CARE PROVIDER: [name], NP  RESPONSIBLE PARTY:  Acct ID - Guarantor Home Phone Work Phone Relationship Acct Type  [id number] YENNY, KOSA* [phone]  Self P/F     [place], [place] [address]   Covid-19 Pre-screening Negative  PLAN OF CARE and INTERVENTION:  1. ADVANCE CARE PLANNING/GOALS OF CARE: Goal is for patient to remain in her home.  2. PATIENT/CAREGIVER EDUCATION: Symptom management, safe mobility, s/s of infection 3. DISEASE STATUS: Met with patient in her home. Upon arrival, she answered the door. She denies pain at this time. Her breathing was regular and unlabored. She wears oxygen at 2L/min via  at night. She is ambulatory, holding onto furniture/walls at times. She walks at a slow pace and is careful not to fall. Her son who lives with her is currently in rehab at [facility] after a hospitalization. She says that she is able to give herself a sponge bath, dress and toilet herself independently. She has a neighbor that comes over daily to bring her mail, grocery shop, pick up her medications and takes out her trash. She does report feeling stressed at times with her son being away. She also speaks of her sister that passed away about 3 months ago. She also speaks of her colovesicular fistula stating that she usually has to go to the hospital every 3 months to get her bladder cleared out due to the collection of feces. She continues to take Fosfomycin every 10 days to help with UTIs. She says that it is helpful but causes her to have diarrhea and abdominal cramping. Questran and Bentyl does help. Will continue to monitor.  HISTORY OF PRESENT ILLNESS: This is a 87 yo female with a h/o Afib, CHF, aortic stenosis, HTN, hyperlipidemia and anxiety. Palliative care team continues to follow patient and will visit patient monthly and PRN.     CODE STATUS: DNR ADVANCED DIRECTIVES: Y MOST FORM: no PPS: 50%   PHYSICAL EXAM:   LUNGS: clear to auscultation  CARDIAC: Cor RRR EXTREMITIES: Trace edema to bilateral lower extremities SKIN: Exposed skin is dry and intact; thin/frail skin  NEURO: Alert and oriented x 3, forgetful, pleasant mood, generalized weakness, ambulatory   (Duration of visit and documentation 75 minutes)   Daryl Eastern, RN BSN

## 2021-02-01 ENCOUNTER — Telehealth: Payer: Self-pay

## 2021-02-01 NOTE — Telephone Encounter (Signed)
(  3:14pm) SW scheduled a visit with patient at her home for 4/6@12pm

## 2021-02-02 ENCOUNTER — Other Ambulatory Visit: Payer: Medicare Other

## 2021-02-02 ENCOUNTER — Other Ambulatory Visit: Payer: Self-pay

## 2021-02-02 ENCOUNTER — Other Ambulatory Visit: Payer: Medicare Other | Admitting: *Deleted

## 2021-02-02 DIAGNOSIS — Z515 Encounter for palliative care: Secondary | ICD-10-CM

## 2021-02-21 NOTE — Progress Notes (Signed)
COMMUNITY PALLIATIVE CARE RN NOTE  PATIENT NAME: [name] DOB: [birth year] MRN: [id number]  PRIMARY CARE PROVIDER: [name], NP  RESPONSIBLE PARTY:  Acct ID - Guarantor Home Phone Work Phone Relationship Acct Type  [id number] JAISLYN, BLINN* [phone]  Self P/F     [place], [place] [address]   Covid-19 Pre-screening Negative  PLAN OF CARE and INTERVENTION:  1. ADVANCE CARE PLANNING/GOALS OF CARE: Goal is for patient to remain in her home. She has a DNR. 2. PATIENT/CAREGIVER EDUCATION: Symptom management, safe mobility, s/s of infection 3. DISEASE STATUS: Joint face-to-face visit made with palliative care MSW, [facility]. Met with patient in her home. Her son, who is her caregiver, remains in [facility] for rehab. Patient requested visit to discuss care options as she feels that she needs assistance with personal care at home. MSW provided patient with a list with in home care agencies, and called one of the agencies on the list for additional information. Patient says that she is on a fixed income and would be unable to afford this. She was under the impression that her insurance would pay for this. She says that she was told that her son may possibly return home on Friday, however she does not know what kind of in home assistance he may require once he is home. She opted not to move forward with hiring anyone. She has a neighbor that checks on her daily and will do her grocery shopping and pick up her medications. She denies pain at this time. She remains on oxygen at 2L/min via [place]. She is able to sit down at the sink and give herself a sponge bath. She is ambulatory using her walker, but gait is unsteady at times. She tires very easily and requires frequent rest periods. She continues on an antibiotic every 10 days, which she says has helped keep her out of the hospital recently. She usually requires a hospital visit every 3 months due to  a hole in her bladder where feces collects over time. No recent UTIs. Will continue to monitor.  HISTORY OF PRESENT ILLNESS:This is a 87 yo female with a h/o Afib, CHF, aortic stenosis, HTN, hyperlipidemia and anxiety. Palliative care team continues to follow patient and will visit patient monthly and PRN.     CODE STATUS: DNR  ADVANCED DIRECTIVES: Y MOST FORM: no PPS: 50%   PHYSICAL EXAM:   LUNGS: clear to auscultation  CARDIAC: Cor RRR EXTREMITIES: Trace edema noted to bilateral lower extremities SKIN: Thin/frail skin; no skin breakdown  NEURO: Alert and oriented x 3, forgetful, generalized weakness, ambulatory    (Duration of visit and documentation 60 minutes)   Daryl Eastern, RN BSN

## 2021-02-22 ENCOUNTER — Encounter (HOSPITAL_COMMUNITY): Payer: Self-pay | Admitting: Emergency Medicine

## 2021-02-22 ENCOUNTER — Emergency Department (HOSPITAL_COMMUNITY)
Admission: EM | Admit: 2021-02-22 | Discharge: 2021-02-22 | Disposition: A | Discharge status: Home / Self Care | Payer: Medicare Other | Attending: Emergency Medicine | Admitting: Emergency Medicine

## 2021-02-22 ENCOUNTER — Emergency Department (HOSPITAL_COMMUNITY): Payer: Medicare Other

## 2021-02-22 ENCOUNTER — Other Ambulatory Visit: Payer: Self-pay

## 2021-02-22 DIAGNOSIS — E039 Hypothyroidism, unspecified: Secondary | ICD-10-CM | POA: Insufficient documentation

## 2021-02-22 DIAGNOSIS — Z87891 Personal history of nicotine dependence: Secondary | ICD-10-CM | POA: Insufficient documentation

## 2021-02-22 DIAGNOSIS — I5042 Chronic combined systolic (congestive) and diastolic (congestive) heart failure: Secondary | ICD-10-CM | POA: Diagnosis not present

## 2021-02-22 DIAGNOSIS — I10 Essential (primary) hypertension: Secondary | ICD-10-CM | POA: Diagnosis not present

## 2021-02-22 DIAGNOSIS — R1084 Generalized abdominal pain: Secondary | ICD-10-CM | POA: Diagnosis not present

## 2021-02-22 DIAGNOSIS — I110 Hypertensive heart disease with heart failure: Secondary | ICD-10-CM | POA: Diagnosis not present

## 2021-02-22 DIAGNOSIS — Z79899 Other long term (current) drug therapy: Secondary | ICD-10-CM | POA: Diagnosis not present

## 2021-02-22 DIAGNOSIS — Z7982 Long term (current) use of aspirin: Secondary | ICD-10-CM | POA: Diagnosis not present

## 2021-02-22 DIAGNOSIS — R110 Nausea: Secondary | ICD-10-CM | POA: Diagnosis not present

## 2021-02-22 DIAGNOSIS — R1030 Lower abdominal pain, unspecified: Secondary | ICD-10-CM | POA: Insufficient documentation

## 2021-02-22 DIAGNOSIS — K6389 Other specified diseases of intestine: Secondary | ICD-10-CM | POA: Diagnosis not present

## 2021-02-22 DIAGNOSIS — R42 Dizziness and giddiness: Secondary | ICD-10-CM | POA: Diagnosis not present

## 2021-02-22 DIAGNOSIS — R140 Abdominal distension (gaseous): Secondary | ICD-10-CM | POA: Diagnosis not present

## 2021-02-22 DIAGNOSIS — R109 Unspecified abdominal pain: Secondary | ICD-10-CM | POA: Diagnosis present

## 2021-02-22 DIAGNOSIS — K449 Diaphragmatic hernia without obstruction or gangrene: Secondary | ICD-10-CM | POA: Diagnosis not present

## 2021-02-22 DIAGNOSIS — K429 Umbilical hernia without obstruction or gangrene: Secondary | ICD-10-CM | POA: Diagnosis not present

## 2021-02-22 DIAGNOSIS — N321 Vesicointestinal fistula: Secondary | ICD-10-CM

## 2021-02-22 DIAGNOSIS — N390 Urinary tract infection, site not specified: Secondary | ICD-10-CM

## 2021-02-22 LAB — CBC
HCT: 32.3 % — ABNORMAL LOW (ref 36.0–46.0)
Hemoglobin: 9.8 g/dL — ABNORMAL LOW (ref 12.0–15.0)
MCH: 25.3 pg — ABNORMAL LOW (ref 26.0–34.0)
MCHC: 30.3 g/dL (ref 30.0–36.0)
MCV: 83.2 fL (ref 80.0–100.0)
Platelets: 273 10*3/uL (ref 150–400)
RBC: 3.88 MIL/uL (ref 3.87–5.11)
RDW: 18.4 % — ABNORMAL HIGH (ref 11.5–15.5)
WBC: 11.7 10*3/uL — ABNORMAL HIGH (ref 4.0–10.5)
nRBC: 0 % (ref 0.0–0.2)

## 2021-02-22 LAB — URINALYSIS, ROUTINE W REFLEX MICROSCOPIC
Bilirubin Urine: NEGATIVE
Glucose, UA: NEGATIVE mg/dL
Ketones, ur: NEGATIVE mg/dL
Nitrite: POSITIVE — AB
Protein, ur: 30 mg/dL — AB
Specific Gravity, Urine: 1.011 (ref 1.005–1.030)
WBC, UA: >50 WBC/hpf — ABNORMAL HIGH (ref 0–5)
pH: 5 (ref 5.0–8.0)

## 2021-02-22 LAB — COMPREHENSIVE METABOLIC PANEL
ALT: 8 U/L (ref 0–44)
AST: 16 U/L (ref 15–41)
Albumin: 3.6 g/dL (ref 3.5–5.0)
Alkaline Phosphatase: 90 U/L (ref 38–126)
Anion gap: 8 (ref 5–15)
BUN: 19 mg/dL (ref 8–23)
CO2: 24 mmol/L (ref 22–32)
Calcium: 8.9 mg/dL (ref 8.9–10.3)
Chloride: 108 mmol/L (ref 98–111)
Creatinine, Ser: 0.94 mg/dL (ref 0.44–1.00)
GFR, Estimated: 57 mL/min — ABNORMAL LOW (ref >60)
Glucose, Bld: 100 mg/dL — ABNORMAL HIGH (ref 70–99)
Potassium: 4.2 mmol/L (ref 3.5–5.1)
Sodium: 140 mmol/L (ref 135–145)
Total Bilirubin: 0.6 mg/dL (ref 0.3–1.2)
Total Protein: 7.6 g/dL (ref 6.5–8.1)

## 2021-02-22 LAB — LIPASE, BLOOD: Lipase: 45 U/L (ref 11–51)

## 2021-02-22 MED ORDER — SODIUM CHLORIDE 0.9 % IV BOLUS
500.0000 mL | Freq: Once | INTRAVENOUS | Status: AC
  Administered 2021-02-22: 500 mL via INTRAVENOUS

## 2021-02-22 MED ORDER — CEPHALEXIN 500 MG PO CAPS: 500.0000 mg | ORAL_CAPSULE | Freq: Three times a day (TID) | ORAL | 0 refills | Status: DC

## 2021-02-22 MED ORDER — HYDRALAZINE HCL 20 MG/ML IJ SOLN
5.0000 mg | Freq: Once | INTRAMUSCULAR | Status: AC
  Administered 2021-02-22: 5 mg via INTRAVENOUS
  Filled 2021-02-22: qty 1

## 2021-02-22 MED ORDER — ONDANSETRON HCL 4 MG/2ML IJ SOLN
4.0000 mg | Freq: Once | INTRAMUSCULAR | Status: AC
  Administered 2021-02-22: 4 mg via INTRAVENOUS
  Filled 2021-02-22: qty 2

## 2021-02-22 MED ORDER — SODIUM CHLORIDE 0.9 % IV SOLN
1.0000 g | Freq: Once | INTRAVENOUS | Status: AC
  Administered 2021-02-22: 1 g via INTRAVENOUS
  Filled 2021-02-22: qty 10

## 2021-02-22 MED ORDER — FENTANYL CITRATE (PF) 100 MCG/2ML IJ SOLN
50.0000 ug | Freq: Once | INTRAMUSCULAR | Status: AC
Start: 2021-02-22 — End: 2021-02-22
  Administered 2021-02-22: 50 ug via INTRAVENOUS
  Filled 2021-02-22: qty 2

## 2021-02-22 MED ORDER — METRONIDAZOLE IN NACL 5-0.79 MG/ML-% IV SOLN
500.0000 mg | Freq: Once | INTRAVENOUS | Status: AC
  Administered 2021-02-22: 500 mg via INTRAVENOUS
  Filled 2021-02-22: qty 100

## 2021-02-22 MED ORDER — CIPROFLOXACIN IN D5W 400 MG/200ML IV SOLN: 400.0000 mg | Freq: Once | INTRAVENOUS | Status: DC

## 2021-02-22 NOTE — Discharge Instructions (Addendum)
Take keflex three times daily for a week   You need to see urology for follow-up  See your primary care doctor for follow-up.    Return to ER if you have worse abdominal pain, fever, vomiting, dizziness, chest pain

## 2021-02-22 NOTE — ED Notes (Signed)
Patient able to tolerate PO fluids well.

## 2021-02-22 NOTE — ED Provider Notes (Signed)
[facility] DEPT Provider Note   CSN: :[id number] Arrival date & time: 02/22/21  1418     History Chief Complaint  Patient presents with  . Abdominal Pain  . Dizziness    [name] is a 87 y.o. female history of hypertension, high cholesterol, aortic stenosis, heart failure, here presenting with abdominal pain and trouble urinating.  Patient states that she has sharp pain when she urinates.  She states that she has recurrent urinary tract infections.  She states that her bladder feels distended.  She is also nauseated and had some dizziness as well.  Patient has history of vertigo and states that dizziness is worse today.  Denies any focal weakness or trouble speaking.  Denies any fevers or vomiting or chest pain  The history is provided by the patient.       Past Medical History:  Diagnosis Date  . A-fib ([place])   . Acute CHF (congestive heart failure) ([place]) 09/02/2019  . Aortic stenosis, moderate    last echo in 2010; AVA 1.1; mild AS per echo in June 2013  . Colovesical fistula   . Diverticulitis   . Edema of both legs    s/p laser treatment per Dr. Donnetta Hutching  . GAD (generalized anxiety disorder) 05/20/2020  . Hiatal hernia   . History of chicken pox   . HLD (hyperlipidemia)   . HTN (hypertension)   . Hypothyroidism   . IBS (irritable bowel syndrome)   . Iron deficiency 09/19/2017  . Iron deficiency anemia   . Iron deficiency anemia 02/22/2020  . Localized edema 06/21/2012   Overview:  Vascular surgeon - Dr Donnetta Hutching  . Neuropathic pain 09/06/2015  . Rheumatoid arthritis [facility])     Patient Active Problem List   Diagnosis Date Noted  . Abdominal pain 08/13/2020  . Hiatal hernia   . Abnormal CT of the abdomen   . Chronic combined systolic and diastolic congestive heart failure ([place]) 04/21/2020  . Hypoxia 04/21/2020  . Metabolic acidosis [id number]  . Cystitis 02/10/2020  . Aortic atherosclerosis ([place]) 09/16/2019  . Complicated UTI  (urinary tract infection) 08/21/2019  . Colovesical fistula 05/28/2019  . Polyneuropathy 11/06/2018  . Goals of care, counseling/discussion   . Hydronephrosis 10/13/2018  . Ovarian mass 10/13/2018  . C. difficile colitis 10/13/2018  . Clostridium difficile colitis 09/24/2018  . Hypomagnesemia   . Left ovarian cyst   . Acute diverticulitis 09/07/2018  . Malnutrition of moderate degree 07/26/2018  . Perforation of sigmoid colon due to diverticulitis 07/25/2018  . Sepsis ([place]) 07/24/2018  . Generalized abdominal pain 02/06/2018  . Paresthesia of both lower extremities 02/06/2018  . Anemia 01/07/2018  . Increased platelet count 09/18/2017  . Pyoderma gangrenosum 01/04/2017  . Primary osteoarthritis of both hands 01/04/2017  . Primary osteoarthritis of both feet 01/04/2017  . Primary osteoarthritis of both knees 01/04/2017  . Degenerative joint disease involving multiple joints 01/04/2017  . Neuropathic pain 09/06/2015  . Erythema nodosum 09/21/2014  . Rheumatoid arthritis ([place]) 09/21/2014  . Benign essential hypertension 09/02/2014  . Rectal bleeding 05/15/2014  . Persistent atrial fibrillation ([place]) 05/11/2014  . Hyperlipidemia 04/02/2014  . PAC (premature atrial contraction) 01/23/2014  . Varicose veins of lower extremities with ulcer, unspecified laterality ([place]) 07/23/2012  . Hypothyroid 06/21/2012  . Hypertension 04/19/2012  . Edema of lower extremity 01/17/2012  . Anxiety 11/05/2011  . Aortic stenosis 03/22/2011    Past Surgical History:  Procedure Laterality Date  . APPENDECTOMY  1955  .  CARPAL TUNNEL RELEASE Right   . CATARACT EXTRACTION    . ENDOSCOPIC VEIN LASER TREATMENT    . ENDOVENOUS ABLATION SAPHENOUS VEIN W/ LASER  08-29-2012   left greater saphenous vein   Sherren Mocha Early MD  . ENDOVENOUS ABLATION SAPHENOUS VEIN W/ LASER  09-19-2012   right greater saphenous vein by Curt Jews MD  . EYE SURGERY  2005   Bilateral cataract  . FOOT SURGERY  2003   bilateral  Hammer Toe  . stab phlebectomy Right 01-02-2013   10-15 incisions right thigh and calf by Curt Jews MD     OB History   No obstetric history on file.     Family History  Problem Relation Age of Onset  . Heart disease Mother   . Peripheral vascular disease Mother   . Heart disease Father   . Peripheral vascular disease Father        Right leg amputation  . COPD Father   . Heart disease Brother 33       Heart Disease before age 41  . Heart attack Brother   . Peripheral vascular disease Other   . Hypertension Sister     Social History   Tobacco Use  . Smoking status: Former Smoker    Types: Cigarettes    Quit date: 10/31/1983    Years since quitting: 37.3  . Smokeless tobacco: Never Used  Vaping Use  . Vaping Use: Never used  Substance Use Topics  . Alcohol use: No  . Drug use: No    Home Medications Prior to Admission medications   Medication Sig Start Date End Date Taking? Authorizing Provider  acetaminophen (TYLENOL) 500 MG tablet Take 1,000 mg by mouth every 8 (eight) hours as needed for moderate pain (joint pain).     [provider]  ALPRAZolam Duanne Moron) 0.25 MG tablet Take 1 tablet (0.25 mg total) by mouth at bedtime as needed for anxiety. FOR ANXIETY/RESTLESS LEGS 10/22/20   Nche, Charlene Brooke, NP  aspirin 81 MG chewable tablet Chew 1 tablet (81 mg total) by mouth daily. 10/26/18   Hennie Duos, MD  cholestyramine Lucrezia Starch) 4 g packet Take 1 packet (4 g total) by mouth 2 (two) times daily. 12/09/20   Nche, Charlene Brooke, NP  dicyclomine (BENTYL) 10 MG capsule Take 1 capsule (10 mg total) by mouth 3 (three) times daily before meals. 12/09/20   Nche, Charlene Brooke, NP  ferrous gluconate (FERGON) 324 MG tablet Take 1 tablet (324 mg total) by mouth 2 (two) times daily with a meal. 09/07/20   Nche, Charlene Brooke, NP  fosfomycin (MONUROL) 3 g PACK Take 3 g by mouth once. Repeat in 10 days if needed. 07/26/20   [provider]  furosemide (LASIX) 20  MG tablet Take 1 tablet (20 mg total) by mouth daily as needed. Patient not taking: No sig reported 05/20/20   Nche, Charlene Brooke, NP  gabapentin (NEURONTIN) 100 MG capsule Take 1 capsule (100 mg total) by mouth daily. 09/30/20   Nche, Charlene Brooke, NP  HYDROcodone-acetaminophen (NORCO/VICODIN) 5-325 MG tablet Take 1 tablet by mouth every 6 (six) hours as needed for moderate pain.  07/30/20   [provider]  levothyroxine (SYNTHROID) 50 MCG tablet Take 1 tablet (50 mcg total) by mouth daily before breakfast. 09/07/20   Nche, Charlene Brooke, NP  metoprolol succinate (TOPROL-XL) 25 MG 24 hr tablet Take 3 tablets (75 mg total) by mouth daily. 10/21/20   Nche, Charlene Brooke, NP  metroNIDAZOLE (FLAGYL)  500 MG tablet Take 1 tablet (500 mg total) by mouth 3 (three) times daily. 12/09/20   Nche, Charlene Brooke, NP  Olopatadine HCl 0.2 % SOLN Place 1 drop into both eyes daily. Patient taking differently: Place 1 drop into both eyes in the morning, at noon, in the evening, and at bedtime. 02/25/20   Granville Lewis C, PA-C  pantoprazole (PROTONIX) 40 MG tablet Take 1 tablet (40 mg total) by mouth daily at 6 (six) AM. 08/14/20   Eugenie Filler, MD  potassium chloride SA (KLOR-CON) 20 MEQ tablet Take 1 tablet (20 mEq total) by mouth daily as needed. Patient taking differently: Take 20 mEq by mouth daily. 05/20/20   Nche, Charlene Brooke, NP  Pyridoxine HCl (VITAMIN B-6 PO) Take 1 tablet by mouth daily.    [provider]    Allergies    Infliximab, Aspirin, Carvedilol, Codeine, Contrast media [iodinated diagnostic agents], and Penicillins  Review of Systems   Review of Systems  Gastrointestinal: Positive for abdominal pain.  Neurological: Positive for dizziness.  All other systems reviewed and are negative.   Physical Exam Updated Vital Signs BP (!) 162/89   Pulse 87   Temp 99.3 F (37.4 C) (Oral)   Resp 18   SpO2 99%   Physical Exam Vitals and nursing note reviewed.   Constitutional:      Comments: Chronically ill  HENT:     Head: Normocephalic.     Mouth/Throat:     Mouth: Mucous membranes are moist.  Eyes:     Extraocular Movements: Extraocular movements intact.     Pupils: Pupils are equal, round, and reactive to light.  Cardiovascular:     Rate and Rhythm: Normal rate and regular rhythm.     Heart sounds: Normal heart sounds.  Pulmonary:     Effort: Pulmonary effort is normal.     Breath sounds: Normal breath sounds.  Abdominal:     Comments: Mild suprapubic tenderness.   Skin:    General: Skin is warm.  Neurological:     General: No focal deficit present.     Mental Status: She is oriented to person, place, and time.  Psychiatric:        Mood and Affect: Mood normal.        Behavior: Behavior normal.     ED Results / Procedures / Treatments   Labs (all labs ordered are listed, but only abnormal results are displayed) Labs Reviewed  LIPASE, BLOOD  COMPREHENSIVE METABOLIC PANEL  CBC  URINALYSIS, ROUTINE W REFLEX MICROSCOPIC    EKG None  Radiology No results found.  Procedures Procedures   Medications Ordered in ED Medications  sodium chloride 0.9 % bolus 500 mL (has no administration in time range)    ED Course  I have reviewed the triage vital signs and the nursing notes.  Pertinent labs & imaging results that were available during my care of the patient were reviewed by me and considered in my medical decision making (see chart for details).    MDM Rules/Calculators/A&P                         [name] is a 87 y.o. female who presented with abdominal pain and bladder distention.  I wonder if she is in urinary retention versus has UTI.  Also consider SBO as well.  Patient is DNR and is in the nursing home.  Plan to get CT abdomen pelvis and labs and  urinalysis  7:52 PM UA showed many bacteria and large leuks and nitrates.  Patient's previous urine culture is pansensitive except for Augmentin.  Patient  has a penicillin allergy.  Patient has hiatal hernia on CT and likely previous sigmoid colon diverticulitis.  Patient has a known colovesicular fistula.  Patient also has a stable moderate left hydro-.  Patient's white blood cell count is 11.  Patient is afebrile.  Patient is given Rocephin and Flagyl in the ED.  She is not septic right now. She has been visited by palliative care and is DNR.  I told her that I felt that she would be much more comfortable at home.  She states that she does not want to take Cipro at home as it can cause her to vomit.  Will discharge patient home with Keflex.  We will have her follow-up with urology outpatient for her colovesicular fistula.   Final Clinical Impression(s) / ED Diagnoses Final diagnoses:  None    Rx / DC Orders ED Discharge Orders    None       Drenda Freeze, MD 02/22/21 Karl Bales

## 2021-02-22 NOTE — ED Triage Notes (Signed)
Patient presents with vertigo and intermittent abdominal pain. Pain has lasted for a few days now and is sharp in nature. She reports nausea.   Hx: Bladder fistula  EMS vitals: 155/82 BP 97 HR 20 RR 97% SPO2 on room air 111CBG

## 2021-02-25 LAB — URINE CULTURE: Culture: >=100000 — AB

## 2021-02-26 ENCOUNTER — Telehealth: Payer: Self-pay

## 2021-02-26 NOTE — Telephone Encounter (Signed)
Post ED Visit - Positive Culture Follow-up  Culture report reviewed by antimicrobial stewardship pharmacist: [place] Team []  Elenor Quinones, Pharm.D. []  Heide Guile, Pharm.D., BCPS AQ-ID []  Parks Neptune, Pharm.D., BCPS []  Alycia Rossetti, Pharm.D., BCPS []  [place], Pharm.D., BCPS, AAHIVP []  [name], Pharm.D., BCPS, AAHIVP []  Salome Arnt, PharmD, BCPS []  Johnnette Gourd, PharmD, BCPS []  Hughes Better, PharmD, BCPS []  Leeroy Cha, PharmD []  Laqueta Linden, PharmD, BCPS []  Albertina Parr, PharmD  [place] Team []  Leodis Sias, PharmD []  Lindell Spar, PharmD []  Royetta Asal, PharmD []  Graylin Shiver, Rph []  Rema Fendt) Glennon Mac, PharmD []  Arlyn Dunning, PharmD []  Netta Cedars, PharmD []  Dia Sitter, PharmD []  Leone Haven, PharmD []  Gretta Arab, PharmD []  Theodis Shove, PharmD []  Peggyann Juba, PharmD []  Reuel Boom, PharmD Samul Dada Pharm D  Positive urine culture Treated with Cephalexin, organism sensitive to the same and no further patient follow-up is required at this time.  Genia Del 02/26/2021, 1:00 PM

## 2021-02-28 NOTE — Progress Notes (Signed)
COMMUNITY PALLIATIVE CARE SW NOTE  PATIENT NAME: [name] DOB: 01-17-1927 MRN: [id number]  PRIMARY CARE PROVIDER: [name], NP  RESPONSIBLE PARTY:  Acct ID - Guarantor Home Phone Work Phone Relationship Acct Type  [id number] - Elman,[name] [phone]  Self P/F     [place], [place] [address]     PLAN OF CARE and INTERVENTIONS:             1. GOALS OF CARE/ ADVANCE CARE PLANNING:  Patient's goal is to remain n her home. Patient is a DNR. 2. SOCIAL/EMOTIONAL/SPIRITUAL ASSESSMENT/ INTERVENTIONS:  SW and RN- M. Nadara Mustard completed a joint visit with patient at her home. Patient was son/Son-[name] remains at [facility] for rehab following a hospitalization. Patient is concern about how long her husband will be in rehab and her need for additional care. SW dicussed options for care, in-home care or her choosing to go into a assisted living facility. Patient verbalized her desire to remain in her home, but is concerned that she is unable to pay for in-home care. SW called an agency while in the home to discuss care options and cost. Patient stated that unless her insurance would pay for it, she could not pay for private caregivers, not even for a 1 or 2 days per week. Patient stated that her son may possibly be home on Friday which would be best for her. SW reminded her that her son may not be in a position to help her as he continues to recuperate himself. Patient advised that she wanted to wait before deciding to hire caregivers that she could not really afford. Her neighbor checks on her daily and her son also run errands and pick up groceries for her-patient stated she will be fine for a while. She report that she has been worried about her son in rehab as she does not get regular reports on him. She remains 02 dependent. She is managing her all of her own personal care needs, but is easily fatigued. Her gait remains unsteady, but patient is aware of  the safety concerns. Patient remains open to ongoing support. SW provided reassurance of support, active listening, assessment of needs and comfort of patient, education and provided resources for private caregivers.  3. PATIENT/CAREGIVER EDUCATION/ COPING:  Patient is coping adequately without a primary caregiver.  4. PERSONAL EMERGENCY PLAN:  911 can be activated for emergencies.  5. COMMUNITY RESOURCES COORDINATION/ HEALTH CARE NAVIGATION:  None. Resources provided on agencies for caregiver support.  6. FINANCIAL/LEGAL CONCERNS/INTERVENTIONS:  None.     SOCIAL HX:  Social History   Tobacco Use  . Smoking status: Former Smoker    Types: Cigarettes    Quit date: 10/31/1983    Years since quitting: 37.3  . Smokeless tobacco: Never Used  Substance Use Topics  . Alcohol use: No    CODE STATUS: DNR ADVANCED DIRECTIVES: No MOST FORM COMPLETE:  No HOSPICE EDUCATION PROVIDED: No  PPS:  Patient is alert and oriented to self x3. Shecontinues toambulate with a walker.Patient is independent for personal care.  Duration of visit and documentation:60 minutes       Katheren Puller, LCSW

## 2021-03-02 ENCOUNTER — Other Ambulatory Visit: Payer: Self-pay

## 2021-03-03 ENCOUNTER — Ambulatory Visit: Payer: Medicare Other | Admitting: Nurse Practitioner

## 2021-03-08 DIAGNOSIS — N1330 Unspecified hydronephrosis: Secondary | ICD-10-CM | POA: Diagnosis not present

## 2021-03-08 DIAGNOSIS — N3020 Other chronic cystitis without hematuria: Secondary | ICD-10-CM | POA: Diagnosis not present

## 2021-03-10 ENCOUNTER — Ambulatory Visit (INDEPENDENT_AMBULATORY_CARE_PROVIDER_SITE_OTHER): Payer: Medicare Other | Admitting: Nurse Practitioner

## 2021-03-10 ENCOUNTER — Encounter: Payer: Self-pay | Admitting: Nurse Practitioner

## 2021-03-10 ENCOUNTER — Telehealth: Payer: Self-pay | Admitting: *Deleted

## 2021-03-10 ENCOUNTER — Other Ambulatory Visit: Payer: Self-pay

## 2021-03-10 VITALS — BP 124/76 | HR 82 | Temp 97.7°F | Ht 60.0 in | Wt 117.4 lb

## 2021-03-10 DIAGNOSIS — G629 Polyneuropathy, unspecified: Secondary | ICD-10-CM | POA: Diagnosis not present

## 2021-03-10 DIAGNOSIS — F419 Anxiety disorder, unspecified: Secondary | ICD-10-CM | POA: Diagnosis not present

## 2021-03-10 DIAGNOSIS — R739 Hyperglycemia, unspecified: Secondary | ICD-10-CM

## 2021-03-10 DIAGNOSIS — F411 Generalized anxiety disorder: Secondary | ICD-10-CM | POA: Diagnosis not present

## 2021-03-10 DIAGNOSIS — I5042 Chronic combined systolic (congestive) and diastolic (congestive) heart failure: Secondary | ICD-10-CM

## 2021-03-10 DIAGNOSIS — E039 Hypothyroidism, unspecified: Secondary | ICD-10-CM

## 2021-03-10 DIAGNOSIS — I10 Essential (primary) hypertension: Secondary | ICD-10-CM

## 2021-03-10 MED ORDER — ALPRAZOLAM 0.25 MG PO TABS: 0.2500 mg | ORAL_TABLET | Freq: Every evening | ORAL | 2 refills | Status: DC | PRN

## 2021-03-10 NOTE — Assessment & Plan Note (Signed)
Stable weight Repeat TSh and T4

## 2021-03-10 NOTE — Progress Notes (Signed)
Subjective:  Patient ID: [name], female    DOB: 09/07/1927  Age: 87 y.o. MRN: [id number]  CC: Follow-up (6 month f/u on HTN and hyperlipidemia. )  HPI Accompanied by Son-michael.  Hypothyroid Stable weight Repeat TSh and T4  Benign essential hypertension BP at goal BP Readings from Last 3 Encounters:  03/10/21 124/76  02/22/21 139/72  12/09/20 (!) 159/79    maintain current medications Repeat BMP  Anxiety Stable mood Refill alprazolam  Polyneuropathy Stable with gabapentin  Wt Readings from Last 3 Encounters:  03/10/21 117 lb 6.4 oz (53.3 kg)  09/03/20 109 lb 9.6 oz (49.7 kg)  08/13/20 112 lb 3.4 oz (50.9 kg)   Reviewed past Medical, Social and Family history today.  Outpatient Medications Prior to Visit  Medication Sig Dispense Refill  . acetaminophen (TYLENOL) 500 MG tablet Take 1,000 mg by mouth every 8 (eight) hours as needed for moderate pain (joint pain).     Marland Kitchen aspirin 81 MG chewable tablet Chew 1 tablet (81 mg total) by mouth daily. 30 tablet 0  . cholestyramine (QUESTRAN) 4 g packet Take 1 packet (4 g total) by mouth 2 (two) times daily. 60 each 0  . dicyclomine (BENTYL) 10 MG capsule Take 1 capsule (10 mg total) by mouth 3 (three) times daily before meals. 30 capsule 0  . ferrous gluconate (FERGON) 324 MG tablet Take 1 tablet (324 mg total) by mouth 2 (two) times daily with a meal. 180 tablet 3  . fosfomycin (MONUROL) 3 g PACK Take 3 g by mouth once. Repeat in 10 days if needed.    Marland Kitchen HYDROcodone-acetaminophen (NORCO/VICODIN) 5-325 MG tablet Take 1 tablet by mouth every 6 (six) hours as needed for moderate pain.     . metroNIDAZOLE (FLAGYL) 500 MG tablet Take 1 tablet (500 mg total) by mouth 3 (three) times daily. 21 tablet 0  . Olopatadine HCl 0.2 % SOLN Place 1 drop into both eyes daily. (Patient taking differently: Place 1 drop into both eyes in the morning, at noon, in the evening, and at bedtime.) 2.5 mL 0  . pantoprazole (PROTONIX) 40 MG  tablet Take 1 tablet (40 mg total) by mouth daily at 6 (six) AM. 30 tablet 2  . Pyridoxine HCl (VITAMIN B-6 PO) Take 1 tablet by mouth daily.    Marland Kitchen ALPRAZolam (XANAX) 0.25 MG tablet Take 1 tablet (0.25 mg total) by mouth at bedtime as needed for anxiety. FOR ANXIETY/RESTLESS LEGS 30 tablet 2  . furosemide (LASIX) 20 MG tablet Take 1 tablet (20 mg total) by mouth daily as needed. 30 tablet 5  . gabapentin (NEURONTIN) 100 MG capsule Take 1 capsule (100 mg total) by mouth daily. 90 capsule 1  . levothyroxine (SYNTHROID) 50 MCG tablet Take 1 tablet (50 mcg total) by mouth daily before breakfast. 90 tablet 1  . metoprolol succinate (TOPROL-XL) 25 MG 24 hr tablet Take 3 tablets (75 mg total) by mouth daily. 90 tablet 3  . potassium chloride SA (KLOR-CON) 20 MEQ tablet Take 1 tablet (20 mEq total) by mouth daily as needed. (Patient taking differently: Take 20 mEq by mouth daily.) 30 tablet 5  . cephALEXin (KEFLEX) 500 MG capsule Take 1 capsule (500 mg total) by mouth 3 (three) times daily. (Patient not taking: Reported on 03/10/2021) 21 capsule 0   No facility-administered medications prior to visit.    ROS See HPI  Objective:  BP 124/76 (BP Location: Left Arm, Patient Position: Sitting, Cuff Size: Normal)   Pulse  82   Temp 97.7 F (36.5 C) (Temporal)   Ht 5' (1.524 m)   Wt 117 lb 6.4 oz (53.3 kg)   SpO2 99%   BMI 22.93 kg/m   Physical Exam Constitutional:      General: She is not in acute distress. Cardiovascular:     Rate and Rhythm: Normal rate. Rhythm irregular.     Pulses: Normal pulses.     Heart sounds: Murmur heard.    Pulmonary:     Effort: Pulmonary effort is normal.  Abdominal:     Palpations: Abdomen is soft.     Tenderness: There is no abdominal tenderness. There is no guarding.  Musculoskeletal:     Right lower leg: Edema present.     Left lower leg: Edema present.  Neurological:     Mental Status: She is alert and oriented to person, place, and time.  Psychiatric:         Mood and Affect: Mood normal.        Behavior: Behavior normal.        Thought Content: Thought content normal.    Assessment & Plan:  This visit occurred during the SARS-CoV-2 public health emergency.  Safety protocols were in place, including screening questions prior to the visit, additional usage of staff PPE, and extensive cleaning of exam room while observing appropriate contact time as indicated for disinfecting solutions.   [name] was seen today for follow-up.  Diagnoses and all orders for this visit:  Benign essential hypertension -     CBC w/Diff -     Basic metabolic panel -     furosemide (LASIX) 20 MG tablet; Take 1 tablet (20 mg total) by mouth daily. -     potassium chloride SA (KLOR-CON) 20 MEQ tablet; Take 1 tablet (20 mEq total) by mouth daily. -     metoprolol succinate (TOPROL-XL) 25 MG 24 hr tablet; Take 3 tablets (75 mg total) by mouth daily.  Hypothyroidism, unspecified type -     TSH -     T4, free -     levothyroxine (SYNTHROID) 50 MCG tablet; Take 1 tablet (50 mcg total) by mouth daily before breakfast.  Anxiety  Hyperglycemia -     Hemoglobin A1c  GAD (generalized anxiety disorder) -     ALPRAZolam (XANAX) 0.25 MG tablet; Take 1 tablet (0.25 mg total) by mouth at bedtime as needed for anxiety. FOR ANXIETY/RESTLESS LEGS  Chronic combined systolic and diastolic congestive heart failure (HCC) -     furosemide (LASIX) 20 MG tablet; Take 1 tablet (20 mg total) by mouth daily. -     potassium chloride SA (KLOR-CON) 20 MEQ tablet; Take 1 tablet (20 mEq total) by mouth daily.  Polyneuropathy -     gabapentin (NEURONTIN) 100 MG capsule; Take 1 capsule (100 mg total) by mouth daily.    Problem List Items Addressed This Visit      Cardiovascular and Mediastinum   Benign essential hypertension - Primary    BP at goal BP Readings from Last 3 Encounters:  03/10/21 124/76  02/22/21 139/72  12/09/20 (!) 159/79    maintain current medications Repeat  BMP      Relevant Medications   furosemide (LASIX) 20 MG tablet   potassium chloride SA (KLOR-CON) 20 MEQ tablet   metoprolol succinate (TOPROL-XL) 25 MG 24 hr tablet   Other Relevant Orders   CBC w/Diff (Completed)   Basic metabolic panel (Completed)   Chronic combined systolic and diastolic congestive  heart failure (HCC) (Chronic)   Relevant Medications   furosemide (LASIX) 20 MG tablet   potassium chloride SA (KLOR-CON) 20 MEQ tablet   metoprolol succinate (TOPROL-XL) 25 MG 24 hr tablet     Endocrine   Hypothyroid    Stable weight Repeat TSh and T4      Relevant Medications   levothyroxine (SYNTHROID) 50 MCG tablet   metoprolol succinate (TOPROL-XL) 25 MG 24 hr tablet   Other Relevant Orders   TSH (Completed)   T4, free (Completed)     Nervous and Auditory   Polyneuropathy    Stable with gabapentin      Relevant Medications   ALPRAZolam (XANAX) 0.25 MG tablet   gabapentin (NEURONTIN) 100 MG capsule     Other   Anxiety    Stable mood Refill alprazolam      Relevant Medications   ALPRAZolam (XANAX) 0.25 MG tablet    Other Visit Diagnoses    Hyperglycemia       Relevant Orders   Hemoglobin A1c (Completed)   GAD (generalized anxiety disorder)       Relevant Medications   ALPRAZolam (XANAX) 0.25 MG tablet      Follow-up: Return in about 6 months (around 09/10/2021) for HTN and hypothyrodism (1mins).  Wilfred Lacy, NP

## 2021-03-10 NOTE — Patient Instructions (Signed)
Go to lab for blood draw.

## 2021-03-10 NOTE — Telephone Encounter (Signed)
Called and spoke with patient to schedule a palliative care home visit. Visit scheduled for 5/20@1 :30p.

## 2021-03-10 NOTE — Assessment & Plan Note (Signed)
Stable mood Refill alprazolam

## 2021-03-10 NOTE — Assessment & Plan Note (Signed)
BP at goal BP Readings from Last 3 Encounters:  03/10/21 124/76  02/22/21 139/72  12/09/20 (!) 159/79    maintain current medications Repeat BMP

## 2021-03-11 LAB — CBC WITH DIFFERENTIAL/PLATELET
Basophils Absolute: 0.1 10*3/uL (ref 0.0–0.1)
Basophils Relative: 1.2 % (ref 0.0–3.0)
Eosinophils Absolute: 0.4 10*3/uL (ref 0.0–0.7)
Eosinophils Relative: 4.4 % (ref 0.0–5.0)
HCT: 31.4 % — ABNORMAL LOW (ref 36.0–46.0)
Hemoglobin: 10.2 g/dL — ABNORMAL LOW (ref 12.0–15.0)
Lymphocytes Relative: 16.6 % (ref 12.0–46.0)
Lymphs Abs: 1.6 10*3/uL (ref 0.7–4.0)
MCHC: 32.4 g/dL (ref 30.0–36.0)
MCV: 78.8 fl (ref 78.0–100.0)
Monocytes Absolute: 0.6 10*3/uL (ref 0.1–1.0)
Monocytes Relative: 6.3 % (ref 3.0–12.0)
Neutro Abs: 7.1 10*3/uL (ref 1.4–7.7)
Neutrophils Relative %: 71.5 % (ref 43.0–77.0)
Platelets: 386 10*3/uL (ref 150.0–400.0)
RBC: 3.99 Mil/uL (ref 3.87–5.11)
RDW: 19.2 % — ABNORMAL HIGH (ref 11.5–15.5)
WBC: 9.9 10*3/uL (ref 4.0–10.5)

## 2021-03-11 LAB — BASIC METABOLIC PANEL
BUN: 19 mg/dL (ref 6–23)
CO2: 26 mEq/L (ref 19–32)
Calcium: 8.8 mg/dL (ref 8.4–10.5)
Chloride: 102 mEq/L (ref 96–112)
Creatinine, Ser: 0.95 mg/dL (ref 0.40–1.20)
GFR: 52.31 mL/min — ABNORMAL LOW (ref >60.00)
Glucose, Bld: 89 mg/dL (ref 70–99)
Potassium: 4.1 mEq/L (ref 3.5–5.1)
Sodium: 139 mEq/L (ref 135–145)

## 2021-03-11 LAB — T4, FREE: Free T4: 0.63 ng/dL (ref 0.60–1.60)

## 2021-03-11 LAB — HEMOGLOBIN A1C: Hgb A1c MFr Bld: 6.1 % (ref 4.6–6.5)

## 2021-03-11 LAB — TSH: TSH: 4.4 u[IU]/mL (ref 0.35–4.50)

## 2021-03-13 MED ORDER — FUROSEMIDE 20 MG PO TABS: 20.0000 mg | ORAL_TABLET | Freq: Every day | ORAL | 1 refills | Status: DC

## 2021-03-13 MED ORDER — METOPROLOL SUCCINATE ER 25 MG PO TB24: 75.0000 mg | ORAL_TABLET | Freq: Every day | ORAL | 3 refills | Status: DC

## 2021-03-13 MED ORDER — GABAPENTIN 100 MG PO CAPS: 100.0000 mg | ORAL_CAPSULE | Freq: Every day | ORAL | 1 refills | Status: DC

## 2021-03-13 MED ORDER — LEVOTHYROXINE SODIUM 50 MCG PO TABS: 50.0000 ug | ORAL_TABLET | Freq: Every day | ORAL | 1 refills | Status: DC

## 2021-03-13 MED ORDER — POTASSIUM CHLORIDE CRYS ER 20 MEQ PO TBCR: 20.0000 meq | EXTENDED_RELEASE_TABLET | Freq: Every day | ORAL | 1 refills | Status: DC

## 2021-03-13 NOTE — Assessment & Plan Note (Signed)
Stable with gabapentin

## 2021-03-18 ENCOUNTER — Other Ambulatory Visit: Payer: Medicare Other | Admitting: *Deleted

## 2021-03-18 ENCOUNTER — Other Ambulatory Visit: Payer: Self-pay

## 2021-03-18 ENCOUNTER — Telehealth: Payer: Self-pay | Admitting: Nurse Practitioner

## 2021-03-18 DIAGNOSIS — Z515 Encounter for palliative care: Secondary | ICD-10-CM

## 2021-03-18 NOTE — Telephone Encounter (Signed)
Lattie Haw from [facility] is needing pt's last [facility] 03/10/21 faxed to her at [phone]. If you have any questions please call her at [phone].

## 2021-03-22 NOTE — Progress Notes (Signed)
COMMUNITY PALLIATIVE CARE RN NOTE  PATIENT NAME: [name] DOB: 10-Aug-1927 MRN: [id number]  PRIMARY CARE PROVIDER: [name], NP  RESPONSIBLE PARTY: [name] (son) Acct ID - Guarantor Home Phone Work Phone Relationship Acct Type  [id number] [name], [name]* [phone]  Self P/F     [place], [place] [address]   Covid-19 Pre-screening Negative  PLAN OF CARE and INTERVENTION:  1. ADVANCE CARE PLANNING/GOALS OF CARE: Goal is for patient to remain in her home with her son as caregiver. She has a DNR.  2. PATIENT/CAREGIVER EDUCATION: Symptom management, s/s of infection, safe mobility 3. DISEASE STATUS: Face-to-face visit completed. Met with patient and her son, [name] (who lives with patient), in their home. Patient remains alert and oriented x 4. She is forgetful at times. Pleasant mood and engaging. She denies pain at this time. She is oxygen dependent at 2L/min via [place]. She had an ED visit on 02/22/21 due to abdominal pain, nausea and dizziness. She has a colovesical fistula that causes her to have frequent UTIs. She was placed on a course of antibiotics which she has completed. She is back taking her Fosfomycin every 10 days for UTI prophylaxis. She says she has since had a follow up visit with her Urologist and her urine was much clearer. She reports recently having to take her fluid pill for 5 days in a row due to increased leg edema, which was effective. She is back to only taking this as needed. She reports that she still occasionally has panic attacks, but Xanax remains effective. Her appetite is improving. [name] says she has gained 4 lbs and her current weight is 117 lbs. She speaks of her vision issues. She is only able to see out of her left eye. She is intermittently incontinent of urine. She wear pads. For the past 2 months her son [name] had been hospitalized and sent to rehab. He is now back home better and back able to walk and drive and  has resumed patient errands. Will continue to monitor.    HISTORY OF PRESENT ILLNESS: This is a 87 yo female with a h/o Afib, CHF, aortic stenosis, HTN, hyperlipidemia and anxiety. Palliative care teamcontinues to follow patient and will visit patient monthly and PRN.     CODE STATUS: DNR  ADVANCED DIRECTIVES: Y MOST FORM: no PPS: 50%   (Duration of visit and documentation 75 minutes)   Sydney Eastern, RN BSN

## 2021-03-30 ENCOUNTER — Telehealth: Payer: Self-pay | Admitting: *Deleted

## 2021-03-30 NOTE — Telephone Encounter (Signed)
Called and spoke with patient to provide community resource for Mobile nail care as requested to get her toe nails trimmed. During the conversation, patient reports not feeling well. She says that she feels she has another infection due to her colovesical fistula. She usually requires her bladder to be flushed about every 3 months, however during her last visit to the ED (02/22/21) she says they only gave her antibiotics. The antibiotics did seem to clear things up, however she is now experiencing symptoms again. She says that last night she had so much pain in her kidney region that it woke her up out of her sleep. She did take a Hydrocodone tablet and was able to finally go back to sleep. She does not want to go to the ED. I recommended that she contact her physician first thing in the morning before her situation gets any worse. She agreed. Will call and follow up with patient tomorrow.

## 2021-03-31 ENCOUNTER — Telehealth: Payer: Self-pay | Admitting: Nurse Practitioner

## 2021-03-31 NOTE — Telephone Encounter (Signed)
Patients son is calling to speak to the nurse regarding UTI. States patient was seen in the ED so should not have to come back in, but he has a few questions. Please call him back at [phone].

## 2021-03-31 NOTE — Telephone Encounter (Signed)
What are the questions?

## 2021-04-01 NOTE — Telephone Encounter (Signed)
Pt son states matter has been resolved.

## 2021-04-04 ENCOUNTER — Other Ambulatory Visit: Payer: Self-pay

## 2021-04-04 ENCOUNTER — Other Ambulatory Visit: Payer: Medicare Other

## 2021-04-04 DIAGNOSIS — Z515 Encounter for palliative care: Secondary | ICD-10-CM

## 2021-04-07 NOTE — Progress Notes (Signed)
COMMUNITY PALLIATIVE CARE SW NOTE  PATIENT NAME: [name] DOB: 02/08/1927 MRN: [id number]  PRIMARY CARE PROVIDER: [name], NP  RESPONSIBLE PARTY:  Acct ID - Guarantor Home Phone Work Phone Relationship Acct Type  [id number] - [name],[name] [phone]  Self P/F     [place], [place] [address]     PLAN OF CARE and INTERVENTIONS:             GOALS OF CARE/ ADVANCE CARE PLANNING:  Patient's goal is to remain home. Patient is a DNR.  SOCIAL/EMOTIONAL/SPIRITUAL ASSESSMENT/ INTERVENTIONS:  SW completed a visit with patient at her home. Her son-[name] was present with her. Patient stated that she was not feeling well.She stated that her back was hurting  and has been hurting. She verbalized disappointment in the not being able to get a call back from her doctor. Patient stated that she and her son have called and left several messages. She does not want to go back to the hospital. SW encouraged them to still try to call and express their desire to schedule an appointment. Patient is happy that her son is back home. Her son assists her with personal care needs, household chores and run errands for her. Patient appeared pale. She report increased pain, but also fatigue. SW provided patient with donated diapers as she is having some financial issues. Patient remains 02 dependent. She remains open to palliative care support. SW provided donations, assessment of patient needs and comfort, active listening, supportive presence and reassurance of support  PATIENT/CAREGIVER EDUCATION/ COPING:  Patient expressed some frustration, but overall is coping well.  PERSONAL EMERGENCY PLAN:  911 can be activated for emergencies. COMMUNITY RESOURCES COORDINATION/ HEALTH CARE NAVIGATION:  None. FINANCIAL/LEGAL CONCERNS/INTERVENTIONS:  None.     SOCIAL HX:  Social History   Tobacco Use   Smoking status: Former    Pack years: 0.00    Types: Cigarettes    Quit date:  10/31/1983    Years since quitting: 37.4   Smokeless tobacco: Never  Substance Use Topics   Alcohol use: No    CODE STATUS: DNR ADVANCED DIRECTIVES: No MOST FORM COMPLETE:  No HOSPICE EDUCATION PROVIDED: No  PPS: Patient is alert and oriented to self x3. She is expressing increased pain to her back. She continues to ambulate with a walker.  Patient is independent for personal care.     Duration of visit and documentation: 60 minutes        Katheren Puller, LCSW

## 2021-04-08 ENCOUNTER — Encounter: Payer: Self-pay | Admitting: Urology

## 2021-05-10 ENCOUNTER — Ambulatory Visit: Payer: Medicare Other

## 2021-05-10 ENCOUNTER — Telehealth: Payer: Self-pay | Admitting: *Deleted

## 2021-05-10 NOTE — Telephone Encounter (Signed)
Unable to reach patient for Medical Annual Wellness

## 2021-05-31 ENCOUNTER — Telehealth: Payer: Self-pay

## 2021-05-31 NOTE — Telephone Encounter (Signed)
(  3:56 pm) SW attempted to contact patient and her son-[name] to no avail. SW received their voicemail, but their mailboxes was full.

## 2021-06-10 DIAGNOSIS — N321 Vesicointestinal fistula: Secondary | ICD-10-CM | POA: Diagnosis not present

## 2021-06-10 DIAGNOSIS — N131 Hydronephrosis with ureteral stricture, not elsewhere classified: Secondary | ICD-10-CM | POA: Diagnosis not present

## 2021-06-10 DIAGNOSIS — N3020 Other chronic cystitis without hematuria: Secondary | ICD-10-CM | POA: Diagnosis not present

## 2021-06-20 ENCOUNTER — Other Ambulatory Visit: Payer: Self-pay | Admitting: Nurse Practitioner

## 2021-06-20 DIAGNOSIS — F411 Generalized anxiety disorder: Secondary | ICD-10-CM

## 2021-06-21 NOTE — Telephone Encounter (Signed)
Last seen 03/10/21 Next appt 09/12/21 Chart supports Rx

## 2021-06-21 NOTE — Telephone Encounter (Signed)
Pt called asking that this be filled right away, she took her last pill last night.

## 2021-07-01 ENCOUNTER — Telehealth: Payer: Self-pay

## 2021-07-01 NOTE — Telephone Encounter (Signed)
Lft VM to rtn call to schedule an AWV.  Dm/cma

## 2021-08-14 ENCOUNTER — Emergency Department (HOSPITAL_COMMUNITY): Payer: Medicare Other

## 2021-08-14 ENCOUNTER — Other Ambulatory Visit: Payer: Self-pay

## 2021-08-14 ENCOUNTER — Inpatient Hospital Stay (HOSPITAL_COMMUNITY)
Admission: EM | Admit: 2021-08-14 | Discharge: 2021-08-18 | DRG: 872 | Disposition: A | Discharge status: Home / Self Care | Payer: Medicare Other | Attending: Internal Medicine | Admitting: Internal Medicine

## 2021-08-14 DIAGNOSIS — R1084 Generalized abdominal pain: Secondary | ICD-10-CM | POA: Diagnosis not present

## 2021-08-14 DIAGNOSIS — Z66 Do not resuscitate: Secondary | ICD-10-CM | POA: Diagnosis present

## 2021-08-14 DIAGNOSIS — Z79899 Other long term (current) drug therapy: Secondary | ICD-10-CM

## 2021-08-14 DIAGNOSIS — I4819 Other persistent atrial fibrillation: Secondary | ICD-10-CM | POA: Diagnosis present

## 2021-08-14 DIAGNOSIS — K5732 Diverticulitis of large intestine without perforation or abscess without bleeding: Secondary | ICD-10-CM | POA: Diagnosis present

## 2021-08-14 DIAGNOSIS — Z7982 Long term (current) use of aspirin: Secondary | ICD-10-CM

## 2021-08-14 DIAGNOSIS — I110 Hypertensive heart disease with heart failure: Secondary | ICD-10-CM | POA: Diagnosis not present

## 2021-08-14 DIAGNOSIS — I5042 Chronic combined systolic (congestive) and diastolic (congestive) heart failure: Secondary | ICD-10-CM | POA: Diagnosis not present

## 2021-08-14 DIAGNOSIS — K449 Diaphragmatic hernia without obstruction or gangrene: Secondary | ICD-10-CM | POA: Diagnosis not present

## 2021-08-14 DIAGNOSIS — Z20822 Contact with and (suspected) exposure to covid-19: Secondary | ICD-10-CM | POA: Diagnosis not present

## 2021-08-14 DIAGNOSIS — R0902 Hypoxemia: Secondary | ICD-10-CM | POA: Diagnosis not present

## 2021-08-14 DIAGNOSIS — N136 Pyonephrosis: Secondary | ICD-10-CM | POA: Diagnosis present

## 2021-08-14 DIAGNOSIS — A419 Sepsis, unspecified organism: Secondary | ICD-10-CM | POA: Diagnosis not present

## 2021-08-14 DIAGNOSIS — M549 Dorsalgia, unspecified: Secondary | ICD-10-CM | POA: Diagnosis not present

## 2021-08-14 DIAGNOSIS — Z8249 Family history of ischemic heart disease and other diseases of the circulatory system: Secondary | ICD-10-CM

## 2021-08-14 DIAGNOSIS — E039 Hypothyroidism, unspecified: Secondary | ICD-10-CM | POA: Diagnosis present

## 2021-08-14 DIAGNOSIS — N1330 Unspecified hydronephrosis: Secondary | ICD-10-CM | POA: Diagnosis present

## 2021-08-14 DIAGNOSIS — I4821 Permanent atrial fibrillation: Secondary | ICD-10-CM | POA: Diagnosis not present

## 2021-08-14 DIAGNOSIS — N321 Vesicointestinal fistula: Secondary | ICD-10-CM | POA: Diagnosis present

## 2021-08-14 DIAGNOSIS — E876 Hypokalemia: Secondary | ICD-10-CM | POA: Diagnosis present

## 2021-08-14 DIAGNOSIS — N390 Urinary tract infection, site not specified: Secondary | ICD-10-CM | POA: Diagnosis present

## 2021-08-14 DIAGNOSIS — N10 Acute tubulo-interstitial nephritis: Secondary | ICD-10-CM | POA: Diagnosis present

## 2021-08-14 DIAGNOSIS — E785 Hyperlipidemia, unspecified: Secondary | ICD-10-CM | POA: Diagnosis present

## 2021-08-14 DIAGNOSIS — Z87891 Personal history of nicotine dependence: Secondary | ICD-10-CM

## 2021-08-14 DIAGNOSIS — R000 Tachycardia, unspecified: Secondary | ICD-10-CM | POA: Diagnosis not present

## 2021-08-14 DIAGNOSIS — R404 Transient alteration of awareness: Secondary | ICD-10-CM | POA: Diagnosis not present

## 2021-08-14 DIAGNOSIS — I491 Atrial premature depolarization: Secondary | ICD-10-CM | POA: Diagnosis not present

## 2021-08-14 DIAGNOSIS — M069 Rheumatoid arthritis, unspecified: Secondary | ICD-10-CM | POA: Diagnosis present

## 2021-08-14 DIAGNOSIS — F411 Generalized anxiety disorder: Secondary | ICD-10-CM | POA: Diagnosis present

## 2021-08-14 LAB — URINALYSIS, ROUTINE W REFLEX MICROSCOPIC
Bilirubin Urine: NEGATIVE
Glucose, UA: NEGATIVE mg/dL
Ketones, ur: 5 mg/dL — AB
Nitrite: NEGATIVE
Protein, ur: 100 mg/dL — AB
RBC / HPF: >50 RBC/hpf — ABNORMAL HIGH (ref 0–5)
Specific Gravity, Urine: 1.014 (ref 1.005–1.030)
WBC, UA: >50 WBC/hpf — ABNORMAL HIGH (ref 0–5)
pH: 5 (ref 5.0–8.0)

## 2021-08-14 LAB — CBG MONITORING, ED: Glucose-Capillary: 124 mg/dL — ABNORMAL HIGH (ref 70–99)

## 2021-08-14 MED ORDER — LACTATED RINGERS IV BOLUS (SEPSIS)
250.0000 mL | Freq: Once | INTRAVENOUS | Status: AC
  Administered 2021-08-14: 250 mL via INTRAVENOUS

## 2021-08-14 MED ORDER — SODIUM CHLORIDE 0.9 % IV SOLN
2.0000 g | INTRAVENOUS | Status: DC
  Administered 2021-08-14 – 2021-08-17 (×4): 2 g via INTRAVENOUS
  Filled 2021-08-14 (×6): qty 20

## 2021-08-14 MED ORDER — LACTATED RINGERS IV SOLN: INTRAVENOUS | Status: DC

## 2021-08-14 MED ORDER — ACETAMINOPHEN 500 MG PO TABS
1000.0000 mg | ORAL_TABLET | Freq: Once | ORAL | Status: AC
  Administered 2021-08-14: 1000 mg via ORAL
  Filled 2021-08-14: qty 2

## 2021-08-14 NOTE — ED Provider Notes (Signed)
[facility] EMERGENCY DEPARTMENT Provider Note   CSN: [id number] Arrival date & time: 08/14/21  2058     History No chief complaint on file.   [name] is a 87 y.o. female.  Patient is a 87 year old female with a history of atrial fibrillation, CHF, aortic stenosis, hypertension, hyperlipidemia and rheumatoid arthritis who is presenting today as a code sepsis from home.  Patient reports for the last 3 to 4 days she has not felt well.  She has had dysuria, frequency, urgency, fever and left flank pain.  She denies any vomiting or diarrhea.  No cough, chest pain or shortness of breath.  She is felt a little bit generally weaker than baseline but is still been able to get up and move around the house.  She has had poor appetite and anorexia.    The history is provided by the patient and medical records.      Past Medical History:  Diagnosis Date   A-fib ([place])    Acute CHF (congestive heart failure) ([place]) 09/02/2019   Aortic stenosis, moderate    last echo in 2010; AVA 1.1; mild AS per echo in June 2013   Colovesical fistula    Diverticulitis    Edema of both legs    s/p laser treatment per Dr. Donnetta Hutching   GAD (generalized anxiety disorder) 05/20/2020   Hiatal hernia    History of chicken pox    HLD (hyperlipidemia)    HTN (hypertension)    Hypertension 04/19/2012   Hypothyroidism    IBS (irritable bowel syndrome)    Iron deficiency 09/19/2017   Iron deficiency anemia    Iron deficiency anemia 02/22/2020   Localized edema 06/21/2012   Overview:  Vascular surgeon - Dr Donnetta Hutching   Neuropathic pain 09/06/2015   Rheumatoid arthritis [facility])     Patient Active Problem List   Diagnosis Date Noted   Abdominal pain 08/13/2020   Hiatal hernia    Abnormal CT of the abdomen    Chronic combined systolic and diastolic congestive heart failure ([place]) 04/21/2020   Hypoxia 63/87/5643   Metabolic acidosis 32/95/1884   Cystitis 02/10/2020   Aortic atherosclerosis ([place])  16/60/6301   Complicated UTI (urinary tract infection) 08/21/2019   Colovesical fistula 05/28/2019   Polyneuropathy 11/06/2018   Goals of care, counseling/discussion    Hydronephrosis 10/13/2018   Ovarian mass 10/13/2018   C. difficile colitis 10/13/2018   Clostridium difficile colitis 09/24/2018   Hypomagnesemia    Left ovarian cyst    Acute diverticulitis 09/07/2018   Malnutrition of moderate degree 07/26/2018   Perforation of sigmoid colon due to diverticulitis 07/25/2018   Sepsis ([place]) 07/24/2018   Generalized abdominal pain 02/06/2018   Paresthesia of both lower extremities 02/06/2018   Anemia 01/07/2018   Increased platelet count 09/18/2017   Pyoderma gangrenosum 01/04/2017   Primary osteoarthritis of both hands 01/04/2017   Primary osteoarthritis of both feet 01/04/2017   Primary osteoarthritis of both knees 01/04/2017   Degenerative joint disease involving multiple joints 01/04/2017   Neuropathic pain 09/06/2015   Erythema nodosum 09/21/2014   Rheumatoid arthritis ([place]) 09/21/2014   Benign essential hypertension 09/02/2014   Rectal bleeding 05/15/2014   Persistent atrial fibrillation ([place]) 05/11/2014   Hyperlipidemia 04/02/2014   PAC (premature atrial contraction) 01/23/2014   Varicose veins of lower extremities with ulcer, unspecified laterality ([place]) 07/23/2012   Hypothyroid 06/21/2012   Edema of lower extremity 01/17/2012   Anxiety 11/05/2011   Aortic stenosis 03/22/2011  Past Surgical History:  Procedure Laterality Date   APPENDECTOMY  1955   CARPAL TUNNEL RELEASE Right    CATARACT EXTRACTION     ENDOSCOPIC VEIN LASER TREATMENT     ENDOVENOUS ABLATION SAPHENOUS VEIN W/ LASER  08-29-2012   left greater saphenous vein   Curt Jews MD   ENDOVENOUS ABLATION SAPHENOUS VEIN W/ LASER  09-19-2012   right greater saphenous vein by Curt Jews MD   EYE SURGERY  2005   Bilateral cataract   FOOT SURGERY  2003   bilateral Hammer Toe   stab phlebectomy Right  01-02-2013   10-15 incisions right thigh and calf by Curt Jews MD     OB History   No obstetric history on file.     Family History  Problem Relation Age of Onset   Heart disease Mother    Peripheral vascular disease Mother    Heart disease Father    Peripheral vascular disease Father        Right leg amputation   COPD Father    Heart disease Brother 100       Heart Disease before age 83   Heart attack Brother    Peripheral vascular disease Other    Hypertension Sister     Social History   Tobacco Use   Smoking status: Former    Types: Cigarettes    Quit date: 10/31/1983    Years since quitting: 37.8   Smokeless tobacco: Never  Vaping Use   Vaping Use: Never used  Substance Use Topics   Alcohol use: No   Drug use: No    Home Medications Prior to Admission medications   Medication Sig Start Date End Date Taking? Authorizing Provider  acetaminophen (TYLENOL) 500 MG tablet Take 1,000 mg by mouth every 8 (eight) hours as needed for moderate pain (joint pain).     [provider]  ALPRAZolam Duanne Moron) 0.25 MG tablet TAKE ONE TABLET BY MOUTH EVERY NIGHT AT BEDTIME AS NEEDED FOR ANXIETY 06/21/21   Nche, Charlene Brooke, NP  aspirin 81 MG chewable tablet Chew 1 tablet (81 mg total) by mouth daily. 10/26/18   Hennie Duos, MD  cholestyramine Lucrezia Starch) 4 g packet Take 1 packet (4 g total) by mouth 2 (two) times daily. 12/09/20   Nche, Charlene Brooke, NP  dicyclomine (BENTYL) 10 MG capsule Take 1 capsule (10 mg total) by mouth 3 (three) times daily before meals. 12/09/20   Nche, Charlene Brooke, NP  ferrous gluconate (FERGON) 324 MG tablet Take 1 tablet (324 mg total) by mouth 2 (two) times daily with a meal. 09/07/20   Nche, Charlene Brooke, NP  fosfomycin (MONUROL) 3 g PACK Take 3 g by mouth once. Repeat in 10 days if needed. 07/26/20   [provider]  furosemide (LASIX) 20 MG tablet Take 1 tablet (20 mg total) by mouth daily. 03/13/21   Nche, Charlene Brooke, NP   gabapentin (NEURONTIN) 100 MG capsule Take 1 capsule (100 mg total) by mouth daily. 03/13/21   Nche, Charlene Brooke, NP  HYDROcodone-acetaminophen (NORCO/VICODIN) 5-325 MG tablet Take 1 tablet by mouth every 6 (six) hours as needed for moderate pain.  07/30/20   [provider]  levothyroxine (SYNTHROID) 50 MCG tablet Take 1 tablet (50 mcg total) by mouth daily before breakfast. 03/13/21   Nche, Charlene Brooke, NP  metoprolol succinate (TOPROL-XL) 25 MG 24 hr tablet Take 3 tablets (75 mg total) by mouth daily. 03/13/21   Nche, Charlene Brooke, NP  metroNIDAZOLE (FLAGYL)  500 MG tablet Take 1 tablet (500 mg total) by mouth 3 (three) times daily. 12/09/20   Nche, Charlene Brooke, NP  Olopatadine HCl 0.2 % SOLN Place 1 drop into both eyes daily. Patient taking differently: Place 1 drop into both eyes in the morning, at noon, in the evening, and at bedtime. 02/25/20   Granville Lewis C, PA-C  pantoprazole (PROTONIX) 40 MG tablet Take 1 tablet (40 mg total) by mouth daily at 6 (six) AM. 08/14/20   Eugenie Filler, MD  potassium chloride SA (KLOR-CON) 20 MEQ tablet Take 1 tablet (20 mEq total) by mouth daily. 03/13/21   Nche, Charlene Brooke, NP  Pyridoxine HCl (VITAMIN B-6 PO) Take 1 tablet by mouth daily.    [provider]    Allergies    Infliximab, Aspirin, Carvedilol, Codeine, Contrast media [iodinated diagnostic agents], and Penicillins  Review of Systems   Review of Systems  All other systems reviewed and are negative.  Physical Exam Updated Vital Signs BP (!) 143/79 (BP Location: Left Arm)   Pulse (!) 121   Temp 100 F (37.8 C) (Oral)   Resp (!) 26   Ht 5' (1.524 m)   Wt 49.9 kg   SpO2 98%   BMI 21.48 kg/m   Physical Exam Vitals and nursing note reviewed.  Constitutional:      General: She is in acute distress.     Appearance: She is well-developed.  HENT:     Head: Normocephalic and atraumatic.     Mouth/Throat:     Mouth: Mucous membranes are moist.  Eyes:      Pupils: Pupils are equal, round, and reactive to light.  Cardiovascular:     Rate and Rhythm: Tachycardia present. Rhythm regularly irregular.     Heart sounds: Normal heart sounds. No murmur heard.   No friction rub.  Pulmonary:     Effort: Pulmonary effort is normal.     Breath sounds: Normal breath sounds. No wheezing or rales.  Abdominal:     General: Bowel sounds are normal. There is no distension.     Palpations: Abdomen is soft.     Tenderness: There is abdominal tenderness in the suprapubic area. There is left CVA tenderness. There is no guarding or rebound.  Musculoskeletal:        General: No tenderness. Normal range of motion.     Right lower leg: Edema present.     Left lower leg: Edema present.     Comments: 1+ pitting edema bilateral ankles  Skin:    General: Skin is warm and dry.     Capillary Refill: Capillary refill takes less than 2 seconds.     Findings: No rash.  Neurological:     Mental Status: She is alert and oriented to person, place, and time. Mental status is at baseline.     Cranial Nerves: No cranial nerve deficit.  Psychiatric:        Mood and Affect: Mood normal.        Behavior: Behavior normal.    ED Results / Procedures / Treatments   Labs (all labs ordered are listed, but only abnormal results are displayed) Labs Reviewed  CBG MONITORING, ED - Abnormal; Notable for the following components:      Result Value   Glucose-Capillary 124 (*)    All other components within normal limits    EKG None  Radiology No results found.  Procedures Procedures   Medications Ordered in ED Medications - No data  to display  ED Course  I have reviewed the triage vital signs and the nursing notes.  Pertinent labs & imaging results that were available during my care of the patient were reviewed by me and considered in my medical decision making (see chart for details).    MDM Rules/Calculators/A&P                           Elderly female with  multiple medical problems presenting today with multiple days of dysuria, left flank pain, fever and anorexia.  Patient febrile here to 100, pulse of 121 with atrial fibrillation without complaints of chest pain or shortness of breath.  Patient does have suprapubic tenderness on exam and palpable fullness concern for bladder distention.  She does complain of feeling like she cannot empty her bladder.  Concern for pyelonephritis and sepsis.  Sepsis order set initiated.  Hemodynamically stable at this time.  We will do a bladder scan to ensure patient does not need a Foley catheter.  Covered with Rocephin.  Patient's last urine culture grew out Klebsiella which was resistant to ampicillin but otherwise pan sensitive.  Final Clinical Impression(s) / ED Diagnoses Final diagnoses:  None    Rx / DC Orders ED Discharge Orders     None        Blanchie Dessert, MD 08/24/21 1524

## 2021-08-14 NOTE — ED Triage Notes (Signed)
Pt arrived via [facility] for cc of fever, lower back pain, increased confusion, painful urination, weakness. PMH of recurrent UTIs.   20L AC,EMS administered 1048mb NS  HR 130-150 afib, no history BS 155 BP 128/86 Spo2 96 RR 30 Temp 102

## 2021-08-14 NOTE — ED Notes (Signed)
Bladder scan shows about 39ml

## 2021-08-14 NOTE — Sepsis Progress Note (Signed)
Following for sepsis monitoring ?

## 2021-08-14 NOTE — ED Provider Notes (Signed)
  Provider Note MRN:  [id number]  Arrival date & time: 08/15/21    ED Course and Medical Decision Making  Assumed care from Dr. Maryan Rued at shift change.  Concern for urosepsis, arrived with fever, tachycardia, tachypnea, code sepsis initiated, has received ceftriaxone.  Heart rate has improved.  Judicious fluids given history of CHF.  Awaiting labs, anticipating admission.  .Critical Care Performed by: Maudie Flakes, MD Authorized by: Maudie Flakes, MD   Critical care provider statement:    Critical care time (minutes):  32   Critical care time was exclusive of:  Separately billable procedures and treating other patients   Critical care was necessary to treat or prevent imminent or life-threatening deterioration of the following conditions:  Sepsis   Critical care was time spent personally by me on the following activities:  Discussions with consultants, examination of patient, evaluation of patient's response to treatment, review of old charts, re-evaluation of patient's condition, ordering and review of radiographic studies and ordering and review of laboratory studies   I assumed direction of critical care for this patient from another provider in my specialty: yes     Care discussed with: admitting provider    Final Clinical Impressions(s) / ED Diagnoses     ICD-10-CM   1. Sepsis, due to unspecified organism, unspecified whether acute organ dysfunction present [facility])  A41.9       ED Discharge Orders     None       Discharge Instructions   None     Barth Kirks. [place]onia Small, [place] [email or web address]    Maudie Flakes, MD 08/15/21 [id number]

## 2021-08-15 ENCOUNTER — Inpatient Hospital Stay (HOSPITAL_COMMUNITY): Payer: Medicare Other

## 2021-08-15 ENCOUNTER — Encounter (HOSPITAL_COMMUNITY): Payer: Self-pay | Admitting: Internal Medicine

## 2021-08-15 DIAGNOSIS — N321 Vesicointestinal fistula: Secondary | ICD-10-CM

## 2021-08-15 DIAGNOSIS — I110 Hypertensive heart disease with heart failure: Secondary | ICD-10-CM | POA: Diagnosis present

## 2021-08-15 DIAGNOSIS — N136 Pyonephrosis: Secondary | ICD-10-CM | POA: Diagnosis present

## 2021-08-15 DIAGNOSIS — N390 Urinary tract infection, site not specified: Secondary | ICD-10-CM | POA: Diagnosis not present

## 2021-08-15 DIAGNOSIS — E039 Hypothyroidism, unspecified: Secondary | ICD-10-CM | POA: Diagnosis present

## 2021-08-15 DIAGNOSIS — Z7982 Long term (current) use of aspirin: Secondary | ICD-10-CM | POA: Diagnosis not present

## 2021-08-15 DIAGNOSIS — N134 Hydroureter: Secondary | ICD-10-CM | POA: Diagnosis not present

## 2021-08-15 DIAGNOSIS — N10 Acute tubulo-interstitial nephritis: Secondary | ICD-10-CM

## 2021-08-15 DIAGNOSIS — N1330 Unspecified hydronephrosis: Secondary | ICD-10-CM | POA: Diagnosis not present

## 2021-08-15 DIAGNOSIS — N1339 Other hydronephrosis: Secondary | ICD-10-CM | POA: Diagnosis not present

## 2021-08-15 DIAGNOSIS — I5042 Chronic combined systolic (congestive) and diastolic (congestive) heart failure: Secondary | ICD-10-CM

## 2021-08-15 DIAGNOSIS — I4819 Other persistent atrial fibrillation: Secondary | ICD-10-CM

## 2021-08-15 DIAGNOSIS — F411 Generalized anxiety disorder: Secondary | ICD-10-CM | POA: Diagnosis present

## 2021-08-15 DIAGNOSIS — A419 Sepsis, unspecified organism: Secondary | ICD-10-CM | POA: Diagnosis present

## 2021-08-15 DIAGNOSIS — E785 Hyperlipidemia, unspecified: Secondary | ICD-10-CM | POA: Diagnosis present

## 2021-08-15 DIAGNOSIS — Z8249 Family history of ischemic heart disease and other diseases of the circulatory system: Secondary | ICD-10-CM | POA: Diagnosis not present

## 2021-08-15 DIAGNOSIS — Z87891 Personal history of nicotine dependence: Secondary | ICD-10-CM | POA: Diagnosis not present

## 2021-08-15 DIAGNOSIS — Z20822 Contact with and (suspected) exposure to covid-19: Secondary | ICD-10-CM | POA: Diagnosis present

## 2021-08-15 DIAGNOSIS — E876 Hypokalemia: Secondary | ICD-10-CM | POA: Diagnosis present

## 2021-08-15 DIAGNOSIS — K5732 Diverticulitis of large intestine without perforation or abscess without bleeding: Secondary | ICD-10-CM | POA: Diagnosis present

## 2021-08-15 DIAGNOSIS — M069 Rheumatoid arthritis, unspecified: Secondary | ICD-10-CM | POA: Diagnosis present

## 2021-08-15 DIAGNOSIS — I4821 Permanent atrial fibrillation: Secondary | ICD-10-CM | POA: Diagnosis present

## 2021-08-15 DIAGNOSIS — Z79899 Other long term (current) drug therapy: Secondary | ICD-10-CM | POA: Diagnosis not present

## 2021-08-15 DIAGNOSIS — Z66 Do not resuscitate: Secondary | ICD-10-CM | POA: Diagnosis present

## 2021-08-15 LAB — PROCALCITONIN: Procalcitonin: 0.61 ng/mL

## 2021-08-15 LAB — CBC WITH DIFFERENTIAL/PLATELET
Abs Immature Granulocytes: 0.11 10*3/uL — ABNORMAL HIGH (ref 0.00–0.07)
Basophils Absolute: 0.1 10*3/uL (ref 0.0–0.1)
Basophils Relative: 0 %
Eosinophils Absolute: 0 10*3/uL (ref 0.0–0.5)
Eosinophils Relative: 0 %
HCT: 32.1 % — ABNORMAL LOW (ref 36.0–46.0)
Hemoglobin: 10 g/dL — ABNORMAL LOW (ref 12.0–15.0)
Immature Granulocytes: 1 %
Lymphocytes Relative: 5 %
Lymphs Abs: 0.9 10*3/uL (ref 0.7–4.0)
MCH: 27 pg (ref 26.0–34.0)
MCHC: 31.2 g/dL (ref 30.0–36.0)
MCV: 86.5 fL (ref 80.0–100.0)
Monocytes Absolute: 1 10*3/uL (ref 0.1–1.0)
Monocytes Relative: 6 %
Neutro Abs: 15.1 10*3/uL — ABNORMAL HIGH (ref 1.7–7.7)
Neutrophils Relative %: 88 %
Platelets: 314 10*3/uL (ref 150–400)
RBC: 3.71 MIL/uL — ABNORMAL LOW (ref 3.87–5.11)
RDW: 15.8 % — ABNORMAL HIGH (ref 11.5–15.5)
WBC: 17.2 10*3/uL — ABNORMAL HIGH (ref 4.0–10.5)
nRBC: 0 % (ref 0.0–0.2)

## 2021-08-15 LAB — CBC
HCT: 29.4 % — ABNORMAL LOW (ref 36.0–46.0)
Hemoglobin: 9 g/dL — ABNORMAL LOW (ref 12.0–15.0)
MCH: 26.5 pg (ref 26.0–34.0)
MCHC: 30.6 g/dL (ref 30.0–36.0)
MCV: 86.7 fL (ref 80.0–100.0)
Platelets: 261 10*3/uL (ref 150–400)
RBC: 3.39 MIL/uL — ABNORMAL LOW (ref 3.87–5.11)
RDW: 15.9 % — ABNORMAL HIGH (ref 11.5–15.5)
WBC: 13.1 10*3/uL — ABNORMAL HIGH (ref 4.0–10.5)
nRBC: 0 % (ref 0.0–0.2)

## 2021-08-15 LAB — APTT: aPTT: 33 seconds (ref 24–36)

## 2021-08-15 LAB — COMPREHENSIVE METABOLIC PANEL
ALT: 8 U/L (ref 0–44)
ALT: 9 U/L (ref 0–44)
AST: 24 U/L (ref 15–41)
AST: 22 U/L (ref 15–41)
Albumin: 2.4 g/dL — ABNORMAL LOW (ref 3.5–5.0)
Albumin: 2.1 g/dL — ABNORMAL LOW (ref 3.5–5.0)
Alkaline Phosphatase: 112 U/L (ref 38–126)
Alkaline Phosphatase: 91 U/L (ref 38–126)
Anion gap: 11 (ref 5–15)
Anion gap: 7 (ref 5–15)
BUN: 20 mg/dL (ref 8–23)
BUN: 19 mg/dL (ref 8–23)
CO2: 20 mmol/L — ABNORMAL LOW (ref 22–32)
CO2: 24 mmol/L (ref 22–32)
Calcium: 7.8 mg/dL — ABNORMAL LOW (ref 8.9–10.3)
Calcium: 7.8 mg/dL — ABNORMAL LOW (ref 8.9–10.3)
Chloride: 103 mmol/L (ref 98–111)
Chloride: 103 mmol/L (ref 98–111)
Creatinine, Ser: 1.15 mg/dL — ABNORMAL HIGH (ref 0.44–1.00)
Creatinine, Ser: 1.12 mg/dL — ABNORMAL HIGH (ref 0.44–1.00)
GFR, Estimated: 45 mL/min — ABNORMAL LOW (ref >60)
GFR, Estimated: 46 mL/min — ABNORMAL LOW (ref >60)
Glucose, Bld: 124 mg/dL — ABNORMAL HIGH (ref 70–99)
Glucose, Bld: 120 mg/dL — ABNORMAL HIGH (ref 70–99)
Potassium: 3.3 mmol/L — ABNORMAL LOW (ref 3.5–5.1)
Potassium: 4.1 mmol/L (ref 3.5–5.1)
Sodium: 134 mmol/L — ABNORMAL LOW (ref 135–145)
Sodium: 134 mmol/L — ABNORMAL LOW (ref 135–145)
Total Bilirubin: 1.4 mg/dL — ABNORMAL HIGH (ref 0.3–1.2)
Total Bilirubin: 0.7 mg/dL (ref 0.3–1.2)
Total Protein: 6.3 g/dL — ABNORMAL LOW (ref 6.5–8.1)
Total Protein: 5.6 g/dL — ABNORMAL LOW (ref 6.5–8.1)

## 2021-08-15 LAB — CORTISOL-AM, BLOOD: Cortisol - AM: 15.3 ug/dL (ref 6.7–22.6)

## 2021-08-15 LAB — PROTIME-INR
INR: 1.3 — ABNORMAL HIGH (ref 0.8–1.2)
INR: 1.3 — ABNORMAL HIGH (ref 0.8–1.2)
Prothrombin Time: 16.4 seconds — ABNORMAL HIGH (ref 11.4–15.2)
Prothrombin Time: 16.3 seconds — ABNORMAL HIGH (ref 11.4–15.2)

## 2021-08-15 LAB — URINE CULTURE

## 2021-08-15 LAB — LACTIC ACID, PLASMA: Lactic Acid, Venous: 1.6 mmol/L (ref 0.5–1.9)

## 2021-08-15 MED ORDER — POTASSIUM CHLORIDE 10 MEQ/100ML IV SOLN
10.0000 meq | INTRAVENOUS | Status: AC
Start: 2021-08-15 — End: 2021-08-15
  Administered 2021-08-15: 10 meq via INTRAVENOUS
  Filled 2021-08-15 (×2): qty 100

## 2021-08-15 MED ORDER — GABAPENTIN 100 MG PO CAPS
100.0000 mg | ORAL_CAPSULE | Freq: Every day | ORAL | Status: DC
  Administered 2021-08-15 – 2021-08-18 (×4): 100 mg via ORAL
  Filled 2021-08-15 (×4): qty 1

## 2021-08-15 MED ORDER — ONDANSETRON HCL 4 MG PO TABS: 4.0000 mg | ORAL_TABLET | Freq: Four times a day (QID) | ORAL | Status: DC | PRN

## 2021-08-15 MED ORDER — FENTANYL CITRATE PF 50 MCG/ML IJ SOSY: 12.5000 ug | PREFILLED_SYRINGE | INTRAMUSCULAR | Status: DC | PRN

## 2021-08-15 MED ORDER — ENOXAPARIN SODIUM 30 MG/0.3ML IJ SOSY
30.0000 mg | PREFILLED_SYRINGE | INTRAMUSCULAR | Status: DC
  Administered 2021-08-15 – 2021-08-16 (×2): 30 mg via SUBCUTANEOUS
  Filled 2021-08-15 (×4): qty 0.3

## 2021-08-15 MED ORDER — HYDROCODONE-ACETAMINOPHEN 5-325 MG PO TABS
1.0000 | ORAL_TABLET | Freq: Four times a day (QID) | ORAL | Status: DC | PRN
  Filled 2021-08-15: qty 1

## 2021-08-15 MED ORDER — ALPRAZOLAM 0.25 MG PO TABS
0.2500 mg | ORAL_TABLET | Freq: Every day | ORAL | Status: DC
  Administered 2021-08-15 – 2021-08-17 (×3): 0.25 mg via ORAL
  Filled 2021-08-15 (×3): qty 1

## 2021-08-15 MED ORDER — ONDANSETRON HCL 4 MG/2ML IJ SOLN
4.0000 mg | Freq: Four times a day (QID) | INTRAMUSCULAR | Status: DC | PRN
  Administered 2021-08-15: 4 mg via INTRAVENOUS
  Filled 2021-08-15: qty 2

## 2021-08-15 MED ORDER — ENOXAPARIN SODIUM 40 MG/0.4ML IJ SOSY: 40.0000 mg | PREFILLED_SYRINGE | INTRAMUSCULAR | Status: DC

## 2021-08-15 MED ORDER — LACTATED RINGERS IV SOLN: INTRAVENOUS | Status: AC

## 2021-08-15 MED ORDER — METOPROLOL SUCCINATE ER 50 MG PO TB24
75.0000 mg | ORAL_TABLET | Freq: Every day | ORAL | Status: DC
  Administered 2021-08-15 – 2021-08-18 (×4): 75 mg via ORAL
  Filled 2021-08-15 (×3): qty 1
  Filled 2021-08-15: qty 3

## 2021-08-15 MED ORDER — METRONIDAZOLE 500 MG/100ML IV SOLN
500.0000 mg | Freq: Two times a day (BID) | INTRAVENOUS | Status: DC
  Administered 2021-08-15 – 2021-08-18 (×7): 500 mg via INTRAVENOUS
  Filled 2021-08-15 (×7): qty 100

## 2021-08-15 MED ORDER — ACETAMINOPHEN 650 MG RE SUPP: 650.0000 mg | Freq: Four times a day (QID) | RECTAL | Status: DC | PRN

## 2021-08-15 MED ORDER — ACETAMINOPHEN 325 MG PO TABS
650.0000 mg | ORAL_TABLET | Freq: Four times a day (QID) | ORAL | Status: DC | PRN
  Administered 2021-08-15 – 2021-08-18 (×4): 650 mg via ORAL
  Filled 2021-08-15 (×4): qty 2

## 2021-08-15 MED ORDER — ALPRAZOLAM 0.25 MG PO TABS
0.2500 mg | ORAL_TABLET | Freq: Once | ORAL | Status: AC
  Administered 2021-08-15: 0.25 mg via ORAL
  Filled 2021-08-15: qty 1

## 2021-08-15 NOTE — H&P (Signed)
History and Physical    [name] IZT:[id number] DOB: 04-Mar-1927 DOA: 08/14/2021  PCP: [name], NP  Patient coming from: Home  I have personally briefly reviewed patient's old medical records in [place]  Chief Complaint: Flank pain, dysuria  HPI: [name] is a 87 y.o. female with medical history significant of A.Fib, RA, HTN, AS.  Pt with extensive history of recurrent UTIs secondary to known colovesical fistula from diverticulitis.  Chronic L hydronephrosis.  Pt presents to ED with c/o 3-4 days of worsening, dysuria, urinary urgency, frequency, fever, and L flank pain.  No vomiting, no diarrhea.  Has poor appetite.  No cough, no CP, no SOB.   ED Course: Pt septic, initially A.Fib RVR to 131.  Tm 102.2.  RR 30, WBC 17.2k.  Pt given IVF and rocephin.   Review of Systems: As per HPI, otherwise all review of systems negative.  Past Medical History:  Diagnosis Date   A-fib ([place])    Acute CHF (congestive heart failure) ([place]) 09/02/2019   Aortic stenosis, moderate    last echo in 2010; AVA 1.1; mild AS per echo in June 2013   Colovesical fistula    Diverticulitis    Edema of both legs    s/p laser treatment per Dr. Donnetta Hutching   GAD (generalized anxiety disorder) 05/20/2020   Hiatal hernia    History of chicken pox    HLD (hyperlipidemia)    HTN (hypertension)    Hypertension 04/19/2012   Hypothyroidism    IBS (irritable bowel syndrome)    Iron deficiency 09/19/2017   Iron deficiency anemia    Iron deficiency anemia 02/22/2020   Localized edema 06/21/2012   Overview:  Vascular surgeon - Dr Donnetta Hutching   Neuropathic pain 09/06/2015   Rheumatoid arthritis ([place])     Past Surgical History:  Procedure Laterality Date   APPENDECTOMY  1955   CARPAL TUNNEL RELEASE Right    CATARACT EXTRACTION     ENDOSCOPIC VEIN LASER TREATMENT     ENDOVENOUS ABLATION SAPHENOUS VEIN W/ LASER  08-29-2012   left greater saphenous vein   Curt Jews MD    ENDOVENOUS ABLATION SAPHENOUS VEIN W/ LASER  09-19-2012   right greater saphenous vein by Curt Jews MD   EYE SURGERY  2005   Bilateral cataract   FOOT SURGERY  2003   bilateral Hammer Toe   stab phlebectomy Right 01-02-2013   10-15 incisions right thigh and calf by Curt Jews MD     reports that she quit smoking about 37 years ago. Her smoking use included cigarettes. She has never used smokeless tobacco. She reports that she does not drink alcohol and does not use drugs.  Allergies  Allergen Reactions   Infliximab Other (See Comments)    REACTION: "Enlarged intestines and hernia. Also pushed intestines on lower part of lungs." REACTION: "Enlarged intestines and hernia. Also pushed intestines on lower part of lungs."   Aspirin Nausea And Vomiting    Low Dose is ok Stomach problems   Carvedilol Other (See Comments)    Fatigue/ ill Fatigue/ ill   Codeine Nausea And Vomiting    "Deathly Sick"   Contrast Media [Iodinated Diagnostic Agents] Other (See Comments)    All over body pain   Penicillins Rash    Has patient had a PCN reaction causing immediate rash, facial/tongue/throat swelling, SOB or lightheadedness with hypotension: Y Has patient had a PCN reaction causing severe rash involving mucus membranes or skin necrosis: Y  Has patient had a PCN reaction that required hospitalization: N Has patient had a PCN reaction occurring within the last 10 years: N If all of the above answers are "NO", then may proceed with Cephalosporin use.     Family History  Problem Relation Age of Onset   Heart disease Mother    Peripheral vascular disease Mother    Heart disease Father    Peripheral vascular disease Father        Right leg amputation   COPD Father    Heart disease Brother 47       Heart Disease before age 13   Heart attack Brother    Peripheral vascular disease Other    Hypertension Sister      Prior to Admission medications   Medication Sig Start Date End Date Taking?  Authorizing Provider  acetaminophen (TYLENOL) 500 MG tablet Take 1,000 mg by mouth every 8 (eight) hours as needed for moderate pain (joint pain).     [provider]  ALPRAZolam Duanne Moron) 0.25 MG tablet TAKE ONE TABLET BY MOUTH EVERY NIGHT AT BEDTIME AS NEEDED FOR ANXIETY 06/21/21   Nche, Charlene Brooke, NP  aspirin 81 MG chewable tablet Chew 1 tablet (81 mg total) by mouth daily. 10/26/18   Hennie Duos, MD  cholestyramine Lucrezia Starch) 4 g packet Take 1 packet (4 g total) by mouth 2 (two) times daily. 12/09/20   Nche, Charlene Brooke, NP  dicyclomine (BENTYL) 10 MG capsule Take 1 capsule (10 mg total) by mouth 3 (three) times daily before meals. 12/09/20   Nche, Charlene Brooke, NP  ferrous gluconate (FERGON) 324 MG tablet Take 1 tablet (324 mg total) by mouth 2 (two) times daily with a meal. 09/07/20   Nche, Charlene Brooke, NP  fosfomycin (MONUROL) 3 g PACK Take 3 g by mouth once. Repeat in 10 days if needed. 07/26/20   [provider]  furosemide (LASIX) 20 MG tablet Take 1 tablet (20 mg total) by mouth daily. 03/13/21   Nche, Charlene Brooke, NP  gabapentin (NEURONTIN) 100 MG capsule Take 1 capsule (100 mg total) by mouth daily. 03/13/21   Nche, Charlene Brooke, NP  HYDROcodone-acetaminophen (NORCO/VICODIN) 5-325 MG tablet Take 1 tablet by mouth every 6 (six) hours as needed for moderate pain.  07/30/20   [provider]  levothyroxine (SYNTHROID) 50 MCG tablet Take 1 tablet (50 mcg total) by mouth daily before breakfast. 03/13/21   Nche, Charlene Brooke, NP  metoprolol succinate (TOPROL-XL) 25 MG 24 hr tablet Take 3 tablets (75 mg total) by mouth daily. 03/13/21   Nche, Charlene Brooke, NP  metroNIDAZOLE (FLAGYL) 500 MG tablet Take 1 tablet (500 mg total) by mouth 3 (three) times daily. 12/09/20   Nche, Charlene Brooke, NP  Olopatadine HCl 0.2 % SOLN Place 1 drop into both eyes daily. Patient taking differently: Place 1 drop into both eyes in the morning, at noon, in the evening, and at  bedtime. 02/25/20   Granville Lewis C, PA-C  pantoprazole (PROTONIX) 40 MG tablet Take 1 tablet (40 mg total) by mouth daily at 6 (six) AM. 08/14/20   Eugenie Filler, MD  potassium chloride SA (KLOR-CON) 20 MEQ tablet Take 1 tablet (20 mEq total) by mouth daily. 03/13/21   Nche, Charlene Brooke, NP  Pyridoxine HCl (VITAMIN B-6 PO) Take 1 tablet by mouth daily.    [provider]    Physical Exam: Vitals:   08/14/21 2300 08/15/21 0005 08/15/21 0039 08/15/21 0100  BP: 128/66 136/84  114/87  Pulse: (!) 101 (!) 102  95  Resp: (!) 28 (!) 26  (!) 30  Temp:   98.6 F (37 C)   TempSrc:   Rectal   SpO2: 98% 100%  95%  Weight:      Height:        Constitutional: NAD, calm, comfortable Eyes: PERRL, lids and conjunctivae normal ENMT: Mucous membranes are moist. Posterior pharynx clear of any exudate or lesions.Normal dentition.  Neck: normal, supple, no masses, no thyromegaly Respiratory: clear to auscultation bilaterally, no wheezing, no crackles. Normal respiratory effort. No accessory muscle use.  Cardiovascular: Regular rate and rhythm, no murmurs / rubs / gallops. No extremity edema. 2+ pedal pulses. No carotid bruits.  Abdomen: no tenderness, no masses palpated. No hepatosplenomegaly. Bowel sounds positive.  Musculoskeletal: no clubbing / cyanosis. No joint deformity upper and lower extremities. Good ROM, no contractures. Normal muscle tone.  Skin: no rashes, lesions, ulcers. No induration Neurologic: CN 2-12 grossly intact. Sensation intact, DTR normal. Strength 5/5 in all 4.  Psychiatric: Normal judgment and insight. Alert and oriented x 3. Normal mood.    Labs on Admission: I have personally reviewed following labs and imaging studies  CBC: Recent Labs  Lab 08/14/21 2110  WBC 17.2*  NEUTROABS 15.1*  HGB 10.0*  HCT 32.1*  MCV 86.5  PLT 035   Basic Metabolic Panel: Recent Labs  Lab 08/14/21 2110  NA 134*  K 3.3*  CL 103  CO2 20*  GLUCOSE 124*  BUN 20   CREATININE 1.15*  CALCIUM 7.8*   GFR: Estimated Creatinine Clearance: 22.4 mL/min (A) (by C-G formula based on SCr of 1.15 mg/dL (H)). Liver Function Tests: Recent Labs  Lab 08/14/21 2110  AST 24  ALT 8  ALKPHOS 112  BILITOT 1.4*  PROT 6.3*  ALBUMIN 2.4*   No results for input(s): LIPASE, AMYLASE in the last 168 hours. No results for input(s): AMMONIA in the last 168 hours. Coagulation Profile: Recent Labs  Lab 08/14/21 2110  INR 1.3*   Cardiac Enzymes: No results for input(s): CKTOTAL, CKMB, CKMBINDEX, TROPONINI in the last 168 hours. BNP (last 3 results) No results for input(s): PROBNP in the last 8760 hours. HbA1C: No results for input(s): HGBA1C in the last 72 hours. CBG: Recent Labs  Lab 08/14/21 2108  GLUCAP 124*   Lipid Profile: No results for input(s): CHOL, HDL, LDLCALC, TRIG, CHOLHDL, LDLDIRECT in the last 72 hours. Thyroid Function Tests: No results for input(s): TSH, T4TOTAL, FREET4, T3FREE, THYROIDAB in the last 72 hours. Anemia Panel: No results for input(s): VITAMINB12, FOLATE, FERRITIN, TIBC, IRON, RETICCTPCT in the last 72 hours. Urine analysis:    Component Value Date/Time   COLORURINE YELLOW 08/14/2021 2154   APPEARANCEUR TURBID (A) 08/14/2021 2154   LABSPEC 1.014 08/14/2021 2154   PHURINE 5.0 08/14/2021 2154   GLUCOSEU NEGATIVE 08/14/2021 2154   HGBUR MODERATE (A) 08/14/2021 2154   BILIRUBINUR NEGATIVE 08/14/2021 2154   KETONESUR 5 (A) 08/14/2021 2154   PROTEINUR 100 (A) 08/14/2021 2154   UROBILINOGEN 0.2 07/03/2014 0313   NITRITE NEGATIVE 08/14/2021 2154   LEUKOCYTESUR LARGE (A) 08/14/2021 2154    Radiological Exams on Admission: DG Chest Port 1 View  Result Date: 08/14/2021 CLINICAL DATA:  Possible sepsis EXAM: PORTABLE CHEST 1 VIEW COMPARISON:  08/12/2020 FINDINGS: Cardiac shadow is enlarged but stable. Hiatal hernia is again seen. Aortic calcifications are noted. No focal infiltrate or effusion is noted. No acute bony  abnormality is seen. IMPRESSION: No acute abnormality  noted. Electronically Signed   By: Inez Catalina M.D.   On: 08/14/2021 21:48    EKG: Independently reviewed.  Assessment/Plan Principal Problem:   Sepsis secondary to UTI [facility]) Active Problems:   Persistent atrial fibrillation (HCC)   Hydronephrosis   Colovesical fistula   Chronic combined systolic and diastolic congestive heart failure (HCC)   Acute pyelonephritis    Sepsis secondary to UTI - Empiric rocephin for the moment H/o ESBL but last organism in April this year was a mostly pansensitive K. Oxytoca IVF: 500cc bolus in ED and LR at 150 Tele monitor Sepsis pathway Getting CT A/P: Rule out worsening of hydronephrosis or obstructing stone (pyohydronephrosis that would require intervention) Rule out active acute diverticulitis (h/o recurrent diverticulitis as cause of colovesical fistula). UCx, BCx pending Chronic A.Fib - Med rec pending Looks like previously on metoprolol, will hold, use short acting IV if needed instead. Dont see any anti-coags on med rec previously, presumably not on chronic anti-coags due either to H/o GIB (I see BRBPR on her Problem list previously, presumably high risk for diverticular bleed with very large diverticula demonstrated on numerous prior CT scans) AND/OR Fall risk (patient ambulates with walker at baseline according to June Pal care note) Chronic CHF - Holding diuretics Holding metoprolol in setting of sepsis  DVT prophylaxis: Lovenox (first dose at 10am) Code Status: DNR - per pal care note in June Family Communication: No family in room Disposition Plan: TBD, pending clinical improvement Consults called: None Admission status: Admit to inpatient  Severity of Illness: The appropriate patient status for this patient is INPATIENT. Inpatient status is judged to be reasonable and necessary in order to provide the required intensity of service to ensure the patient's safety. The patient's  presenting symptoms, physical exam findings, and initial radiographic and laboratory data in the context of their chronic comorbidities is felt to place them at high risk for further clinical deterioration. Furthermore, it is not anticipated that the patient will be medically stable for discharge from the hospital within 2 midnights of admission.   Patient meets criteria for sepsis at time of admission. Specifically the patient has at least 2 out of 4 SIRS criteria, namely: Fever, WBC, tachypnea, tachycardia The currently suspected source of infection is Urine / L kidney Lactic acid: 1.6 Blood pressure: 114/87 IVF: 500cc LR bolus then LR at 150 Antibiotics: Rocephin Cultures pending    * I certify that at the point of admission it is my clinical judgment that the patient will require inpatient hospital care spanning beyond 2 midnights from the point of admission due to high intensity of service, high risk for further deterioration and high frequency of surveillance required.*   Thamara Leger M. DO [facility]  How to contact the [facility] Attending or Consulting provider [place] or covering provider during after hours [place], for this patient?  Check the care team in [facility] and look for a) attending/consulting [facility] provider listed and b) the [facility] team listed Log into [email or web address]  Amion Physician Scheduling and messaging for groups and whole hospitals  On call and physician scheduling software for group practices, residents, hospitalists and other medical providers for call, clinic, rotation and shift schedules. On[employer] is a hospital-wide system for scheduling doctors and paging doctors on call. EasyPlot is for scientific plotting and data analysis.  [email or web address]  and use [place]'s universal password to access. If you do not have the password, please contact the hospital operator.  Locate the [facility] provider  you are looking for under [facility] and page to a number that  you can be directly reached. If you still have difficulty reaching the provider, please page the [facility] (Director on Call) for the Hospitalists listed on amion for assistance.  08/15/2021, 1:21 AM

## 2021-08-15 NOTE — Progress Notes (Signed)
Patient seen and examined.  Patient with past medical history significant for A. fib, rheumatoid arthritis, hypertension, AAS, history of recurrent UTI with known colovesical fistula from diverticulitis, chronic left hydronephrosis.  Presents with 3 to 4 days worsening dysuria, poor oral intake.  CT abdomen and pelvis showed sigmoid diverticulitis with suspected chronic colovesical fistula.  Chronic left moderate  hydronephrosis.   1-Sepsis secondary to UTI and diverticulitis: Continue with IV ceftriaxone and Flagyl Patient presented with fever, tachycardia tachypnea and leukocytosis. Continue with IV fluids Clear liquid diet.   2-A fib: Blood pressure improved plan to resume metoprolol 3-chronic diastolic heart failure: Continue to hold diuretics.  Monitor volume status 4-Left moderate hydronephrosis, history of colovesical fistula: Urology consulted

## 2021-08-15 NOTE — Progress Notes (Signed)
Pt arrived to 4E11 from ED, pt oriented to unit and room, tele initiated CCM called.  CHG completed.

## 2021-08-15 NOTE — Progress Notes (Signed)
CT reviewed with radiologist: 1) has diverticulitis, so will add flagyl 2) hydro is actually chronic, not new as originally read, pt has had moderate L hydro since 2019.  Today's hydro shows some urolithial thickening that's new but the overall hydro is unchanged since prior CT in April of this year.

## 2021-08-15 NOTE — ED Notes (Signed)
[name] son [place] [phone] requesting an update

## 2021-08-15 NOTE — ED Notes (Signed)
Admitting MD at BS.  

## 2021-08-15 NOTE — ED Notes (Signed)
Alert, NAD, calm, interactive, denies need for food or drink, pain or nausea meds or other. Updated.

## 2021-08-15 NOTE — ED Notes (Signed)
Pt gone to CT 

## 2021-08-15 NOTE — ED Notes (Signed)
Has not been drinking water, given cranberry juice per request.

## 2021-08-15 NOTE — Consult Note (Signed)
I have been asked to see the patient by Dr. Niel Hummer, for evaluation and management of colovesical fistula with left hydronephrosis.    History of present illness: 87 year old woman with a history of known colovesical fistula secondary to diverticulitis and chronic left hydronephrosis presents to ED with 3 to 4 days of worsening dysuria, urgency and left flank pain.  Repeat imaging shows left hydronephrosis that is chronic and unchanged since 2019.  Patient states she is feeling better than when she initially came into the ED with a fever.  She has never had a stent previously and is not interested in any kind of surgery.  She is followed by Dr. Jeffie Pollock at [facility] and last seen Aug 2022. She has previously been on monurol q 10 days for UTI prophylaxis.    Review of systems: A 12 point comprehensive review of systems was obtained and is negative unless otherwise stated in the history of present illness.  Patient Active Problem List   Diagnosis Date Noted   Acute pyelonephritis 08/15/2021   Abdominal pain 08/13/2020   Hiatal hernia    Abnormal CT of the abdomen    Chronic combined systolic and diastolic congestive heart failure ([place]) 04/21/2020   Hypoxia 16/07/9603   Metabolic acidosis 54/06/8118   Cystitis 02/10/2020   Aortic atherosclerosis ([place]) 14/78/2956   Complicated UTI (urinary tract infection) 08/21/2019   Colovesical fistula 05/28/2019   Polyneuropathy 11/06/2018   Goals of care, counseling/discussion    Hydronephrosis 10/13/2018   Ovarian mass 10/13/2018   C. difficile colitis 10/13/2018   Clostridium difficile colitis 09/24/2018   Hypomagnesemia    Left ovarian cyst    Acute diverticulitis 09/07/2018   Malnutrition of moderate degree 07/26/2018   Perforation of sigmoid colon due to diverticulitis 07/25/2018   Sepsis secondary to UTI ([place]) 07/24/2018   Generalized abdominal pain 02/06/2018   Paresthesia of both lower extremities 02/06/2018   Anemia 01/07/2018    Increased platelet count 09/18/2017   Pyoderma gangrenosum 01/04/2017   Primary osteoarthritis of both hands 01/04/2017   Primary osteoarthritis of both feet 01/04/2017   Primary osteoarthritis of both knees 01/04/2017   Degenerative joint disease involving multiple joints 01/04/2017   Neuropathic pain 09/06/2015   Erythema nodosum 09/21/2014   Rheumatoid arthritis ([place]) 09/21/2014   Benign essential hypertension 09/02/2014   Rectal bleeding 05/15/2014   Persistent atrial fibrillation ([place]) 05/11/2014   Hyperlipidemia 04/02/2014   PAC (premature atrial contraction) 01/23/2014   Varicose veins of lower extremities with ulcer, unspecified laterality ([place]) 07/23/2012   Hypothyroid 06/21/2012   Edema of lower extremity 01/17/2012   Anxiety 11/05/2011   Aortic stenosis 03/22/2011    No current facility-administered medications on file prior to encounter.   Current Outpatient Medications on File Prior to Encounter  Medication Sig Dispense Refill   acetaminophen (TYLENOL) 500 MG tablet Take 1,000 mg by mouth every 8 (eight) hours as needed for moderate pain (joint pain).      ALPRAZolam (XANAX) 0.25 MG tablet TAKE ONE TABLET BY MOUTH EVERY NIGHT AT BEDTIME AS NEEDED FOR ANXIETY (Patient taking differently: Take 0.25 mg by mouth at bedtime.) 30 tablet 2   aspirin 81 MG chewable tablet Chew 1 tablet (81 mg total) by mouth daily. 30 tablet 0   gabapentin (NEURONTIN) 100 MG capsule Take 1 capsule (100 mg total) by mouth daily. 90 capsule 1   HYDROcodone-acetaminophen (NORCO/VICODIN) 5-325 MG tablet Take 1 tablet by mouth every 6 (six) hours as needed for moderate pain.  metoprolol succinate (TOPROL-XL) 25 MG 24 hr tablet Take 3 tablets (75 mg total) by mouth daily. 90 tablet 3    Past Medical History:  Diagnosis Date   A-fib ([place])    Acute CHF (congestive heart failure) ([place]) 09/02/2019   Aortic stenosis, moderate    last echo in 2010; AVA 1.1; mild AS per echo in June 2013    Colovesical fistula    Diverticulitis    Edema of both legs    s/p laser treatment per Dr. Donnetta Hutching   GAD (generalized anxiety disorder) 05/20/2020   Hiatal hernia    History of chicken pox    HLD (hyperlipidemia)    HTN (hypertension)    Hypertension 04/19/2012   Hypothyroidism    IBS (irritable bowel syndrome)    Iron deficiency 09/19/2017   Iron deficiency anemia    Iron deficiency anemia 02/22/2020   Localized edema 06/21/2012   Overview:  Vascular surgeon - Dr Donnetta Hutching   Neuropathic pain 09/06/2015   Rheumatoid arthritis ([place])     Past Surgical History:  Procedure Laterality Date   APPENDECTOMY  1955   CARPAL TUNNEL RELEASE Right    CATARACT EXTRACTION     ENDOSCOPIC VEIN LASER TREATMENT     ENDOVENOUS ABLATION SAPHENOUS VEIN W/ LASER  08-29-2012   left greater saphenous vein   Curt Jews MD   ENDOVENOUS ABLATION SAPHENOUS VEIN W/ LASER  09-19-2012   right greater saphenous vein by Curt Jews MD   EYE SURGERY  2005   Bilateral cataract   FOOT SURGERY  2003   bilateral Hammer Toe   stab phlebectomy Right 01-02-2013   10-15 incisions right thigh and calf by Curt Jews MD    Social History   Tobacco Use   Smoking status: Former    Types: Cigarettes    Quit date: 10/31/1983    Years since quitting: 37.8   Smokeless tobacco: Never  Vaping Use   Vaping Use: Never used  Substance Use Topics   Alcohol use: No   Drug use: No    Family History  Problem Relation Age of Onset   Heart disease Mother    Peripheral vascular disease Mother    Heart disease Father    Peripheral vascular disease Father        Right leg amputation   COPD Father    Heart disease Brother 74       Heart Disease before age 36   Heart attack Brother    Peripheral vascular disease Other    Hypertension Sister     PE: Vitals:   08/15/21 0700 08/15/21 0715 08/15/21 0730 08/15/21 0745  BP: 127/74 136/64 (!) 142/75 (!) 153/84  Pulse: 76 81 70 69  Resp: (!) 24 (!) 30 (!) 23 (!) 22  Temp:       TempSrc:      SpO2: 100% 100% 95% 94%  Weight:      Height:       Patient appears to be in no acute distress  patient is alert and oriented x3 Atraumatic normocephalic head No increased work of breathing Abdomen is soft, nontender, nondistended, no CVA or suprapubic tenderness Lower extremities are symmetric without appreciable edema Grossly neurologically intact No identifiable skin lesions  Recent Labs    08/14/21 2110 08/15/21 0612  WBC 17.2* 13.1*  HGB 10.0* 9.0*  HCT 32.1* 29.4*   Recent Labs    08/14/21 2110 08/15/21 0612  NA 134* 134*  K 3.3* 4.1  CL 103  103  CO2 20* 24  GLUCOSE 124* 120*  BUN 20 19  CREATININE 1.15* 1.12*  CALCIUM 7.8* 7.8*   Recent Labs    08/14/21 2110 08/15/21 0612  INR 1.3* 1.3*   No results for input(s): LABURIN in the last 72 hours. Results for orders placed or performed during the hospital encounter of 08/14/21  Blood Culture (routine x 2)     Status: None (Preliminary result)   Collection Time: 08/14/21  9:10 PM   Specimen: BLOOD LEFT ARM  Result Value Ref Range Status   Specimen Description BLOOD LEFT ARM  Final   Special Requests   Final    BOTTLES DRAWN AEROBIC AND ANAEROBIC Blood Culture adequate volume   Culture   Final    NO GROWTH < 12 HOURS Performed at [facility], [place] [address] [place], [place] [address]    Report Status PENDING  Incomplete  Blood Culture (routine x 2)     Status: None (Preliminary result)   Collection Time: 08/14/21  9:50 PM   Specimen: BLOOD  Result Value Ref Range Status   Specimen Description BLOOD RIGHT ANTECUBITAL  Final   Special Requests   Final    BOTTLES DRAWN AEROBIC AND ANAEROBIC Blood Culture adequate volume   Culture   Final    NO GROWTH < 12 HOURS Performed at [facility], [place] [address], [place], [place] [address]    Report Status PENDING  Incomplete    Imaging: CT Abd/Pelvis 08/15/21 IMPRESSION: Sigmoid diverticulitis with suspected chronic colovesical  fistula. Associated thick-walled bladder with nondependent gas, suggesting cystitis. These findings are similar to prior studies.   Moderate left hydroureteronephrosis, new. This may be on the basis of extrinsic compression from colonic inflammation versus ascending infection due to cystitis.   No evidence of renal or ureteral calculi.   Small bilateral pleural effusions.   Additional ancillary findings as above.     Electronically Signed   By: Julian Hy M.D.   On: 08/15/2021 02:46  Imp/Recommendations: Colovesical fistula with chronic left hydronephrosis: -Patient is not interested in any kind of surgical intervention including ureteral stent -Would recommend treating patient's urine empirically and await culture results/sensitivities -Pt previously on monurol q 10 days for UTI prevention, will have pt follow up as outpt to see if this needs to be changed -No urologic intervention    Carvell Hoeffner D Sora Olivo

## 2021-08-15 NOTE — ED Notes (Signed)
Tray ordered for PT.

## 2021-08-16 NOTE — Progress Notes (Signed)
PROGRESS NOTE  [name] TML:[id number] DOB: 03-May-1927 DOA: 08/14/2021 PCP: [name], NP  HPI/Recap of past 24 hours: Patient with past medical history significant for A. fib, rheumatoid arthritis, hypertension, AAS, history of recurrent UTI with known colovesical fistula from diverticulitis, chronic left hydronephrosis.  Presents with 3 to 4 days worsening dysuria, poor oral intake.  CT abdomen and pelvis showed sigmoid diverticulitis with suspected chronic colovesical fistula.  Chronic left moderate hydronephrosis.  08/16/2021: Patient seen and examined at bedside.  She reports left lower quadrant abdominal tenderness with palpation.  She denies any nausea at the time of this visit.  She has no other complaints.  She is on Rocephin and IV Flagyl for sigmoid diverticulitis.  Assessment/Plan: Principal Problem:   Sepsis secondary to UTI [facility]) Active Problems:   Persistent atrial fibrillation (HCC)   Hydronephrosis   Colovesical fistula   Chronic combined systolic and diastolic congestive heart failure (HCC)   Acute pyelonephritis  Sepsis likely secondary to presumptive UTI and sigmoid diverticulitis. Presented with fever, tachycardia, tachypnea and leukocytosis. Continue Rocephin and IV Flagyl Continue IV fluid hydration. Clear liquid diet as tolerated. Monitor fever curve and WBC.  Presumptive UTI Urinalysis positive for pyuria Urine culture multiple species, suggested recollection Continue Rocephin empirically  Chronic left moderate hydronephrosis with history of colovesicular fistula Urology consulted and following. Patient not interested in any kind of surgical intervention including ureteral stent.  Recommendation to treat UTI empirically and to await culture for ID and sensitivities.  Will follow-up with urology outpatient.  Chronic A. Fib, rate controlled Continue home Toprol-XL 75 mg daily. She is not on oral anticoagulation Continue to monitor  on telemetry.  Chronic diastolic CHF Euvolemic on exam Diuretics on hold Closely monitor volume status while on IV fluid.  Chronic anxiety on Xanax and gabapentin Resume home regimen.   Code Status: DNR.  Family Communication: None at bedside.  Disposition Plan: Likely will discharge to home.   Consultants: Urology.   Procedures: None.  Antimicrobials: Rocephin. Flagyl.  DVT prophylaxis: Subcu Lovenox daily.  Status is: Inpatient  Inpatient status.  Patient will require at least 2 midnights for further evaluation and treatment of present condition.        Objective: Vitals:   08/15/21 2140 08/15/21 2320 08/16/21 0408 08/16/21 0748  BP: 138/76 (!) 150/87 133/70 (!) 140/94  Pulse: 91 100 (!) 103 100  Resp: 15 15 20 20   Temp: 98.3 F (36.8 C) 98.6 F (37 C) 99.1 F (37.3 C) 97.8 F (36.6 C)  TempSrc: Oral Oral Oral Oral  SpO2: 100% 100% 94% 98%  Weight:      Height:        Intake/Output Summary (Last 24 hours) at 08/16/2021 1517 Last data filed at 08/16/2021 1420 Gross per 24 hour  Intake 1300 ml  Output 700 ml  Net 600 ml   Filed Weights   08/14/21 2110  Weight: 49.9 kg    Exam:  General: 87 y.o. year-old female well developed well nourished in no acute distress.  Alert and oriented x3. Cardiovascular: Irregular rate and rhythm with no rubs or gallops.  No thyromegaly or JVD noted.   Respiratory: Clear to auscultation with no wheezes or rales. Good inspiratory effort. Abdomen: Soft nontender nondistended with normal bowel sounds x4 quadrants. Musculoskeletal: Trace lower extremity edema. 2/4 pulses in all 4 extremities. Skin: No ulcerative lesions noted or rashes, Psychiatry: Mood is appropriate for condition and setting   Data Reviewed: CBC: Recent Labs  Lab 08/14/21 2110 08/15/21 0612  WBC 17.2* 13.1*  NEUTROABS 15.1*  --   HGB 10.0* 9.0*  HCT 32.1* 29.4*  MCV 86.5 86.7  PLT 314 761   Basic Metabolic Panel: Recent Labs   Lab 08/14/21 2110 08/15/21 0612  NA 134* 134*  K 3.3* 4.1  CL 103 103  CO2 20* 24  GLUCOSE 124* 120*  BUN 20 19  CREATININE 1.15* 1.12*  CALCIUM 7.8* 7.8*   GFR: Estimated Creatinine Clearance: 23 mL/min (A) (by C-G formula based on SCr of 1.12 mg/dL (H)). Liver Function Tests: Recent Labs  Lab 08/14/21 2110 08/15/21 0612  AST 24 22  ALT 8 9  ALKPHOS 112 91  BILITOT 1.4* 0.7  PROT 6.3* 5.6*  ALBUMIN 2.4* 2.1*   No results for input(s): LIPASE, AMYLASE in the last 168 hours. No results for input(s): AMMONIA in the last 168 hours. Coagulation Profile: Recent Labs  Lab 08/14/21 2110 08/15/21 0612  INR 1.3* 1.3*   Cardiac Enzymes: No results for input(s): CKTOTAL, CKMB, CKMBINDEX, TROPONINI in the last 168 hours. BNP (last 3 results) No results for input(s): PROBNP in the last 8760 hours. HbA1C: No results for input(s): HGBA1C in the last 72 hours. CBG: Recent Labs  Lab 08/14/21 2108  GLUCAP 124*   Lipid Profile: No results for input(s): CHOL, HDL, LDLCALC, TRIG, CHOLHDL, LDLDIRECT in the last 72 hours. Thyroid Function Tests: No results for input(s): TSH, T4TOTAL, FREET4, T3FREE, THYROIDAB in the last 72 hours. Anemia Panel: No results for input(s): VITAMINB12, FOLATE, FERRITIN, TIBC, IRON, RETICCTPCT in the last 72 hours. Urine analysis:    Component Value Date/Time   COLORURINE YELLOW 08/14/2021 2154   APPEARANCEUR TURBID (A) 08/14/2021 2154   LABSPEC 1.014 08/14/2021 2154   PHURINE 5.0 08/14/2021 2154   GLUCOSEU NEGATIVE 08/14/2021 2154   HGBUR MODERATE (A) 08/14/2021 2154   BILIRUBINUR NEGATIVE 08/14/2021 2154   KETONESUR 5 (A) 08/14/2021 2154   PROTEINUR 100 (A) 08/14/2021 2154   UROBILINOGEN 0.2 07/03/2014 0313   NITRITE NEGATIVE 08/14/2021 2154   LEUKOCYTESUR LARGE (A) 08/14/2021 2154   Sepsis Labs: @LABRCNTIP (procalcitonin:4,lacticidven:4)  ) Recent Results (from the past 240 hour(s))  Blood Culture (routine x 2)     Status: None  (Preliminary result)   Collection Time: 08/14/21  9:10 PM   Specimen: BLOOD LEFT ARM  Result Value Ref Range Status   Specimen Description BLOOD LEFT ARM  Final   Special Requests   Final    BOTTLES DRAWN AEROBIC AND ANAEROBIC Blood Culture adequate volume   Culture   Final    NO GROWTH 2 DAYS Performed at [facility], [place] [address], [place], [place] [address]    Report Status PENDING  Incomplete  Blood Culture (routine x 2)     Status: None (Preliminary result)   Collection Time: 08/14/21  9:50 PM   Specimen: BLOOD  Result Value Ref Range Status   Specimen Description BLOOD RIGHT ANTECUBITAL  Final   Special Requests   Final    BOTTLES DRAWN AEROBIC AND ANAEROBIC Blood Culture adequate volume   Culture   Final    NO GROWTH 2 DAYS Performed at [facility], [place] [address], [place], [place] [address]    Report Status PENDING  Incomplete  Urine Culture     Status: Abnormal   Collection Time: 08/14/21 10:40 PM   Specimen: In/Out Cath Urine  Result Value Ref Range Status   Specimen Description IN/OUT CATH URINE  Final  Special Requests   Final    NONE Performed at [facility], [place] [address], [place], [place] [address]    Culture MULTIPLE SPECIES PRESENT, SUGGEST RECOLLECTION (A)  Final   Report Status 08/15/2021 FINAL  Final      Studies: No results found.  Scheduled Meds:  ALPRAZolam  0.25 mg Oral QHS   enoxaparin (LOVENOX) injection  30 mg Subcutaneous Q24H   gabapentin  100 mg Oral Daily   metoprolol succinate  75 mg Oral Daily    Continuous Infusions:  cefTRIAXone (ROCEPHIN)  IV 2 g (08/15/21 2320)   lactated ringers 50 mL/hr at 08/16/21 0824   metronidazole 500 mg (08/16/21 0532)     LOS: 1 day     Kayleen Memos, MD [facility] Pager [phone]  If 7PM-7AM, please contact night-coverage [email or web address] Password [email or web address] 08/16/2021, 3:17 PM

## 2021-08-16 NOTE — Progress Notes (Signed)
Mobility Specialist Progress Note:   08/16/21 1239  Mobility  Activity Ambulated to bathroom  Level of Assistance Contact guard assist, steadying assist  Assistive Device Other (Comment) (HHA)  Distance Ambulated (ft) 40 ft  Mobility Ambulated with assistance in room  Mobility Response Tolerated well  Mobility performed by Mobility specialist  Bed Position Chair  $Mobility charge 1 Mobility   Pre- Mobility: 89 HR;  133/93 BP;  99% SpO2 Post Mobility:   104 HR;  129/81 BP;  97% SpO2  Pt received in bed willing to participate in ambulation. No complaints of pain and asymptomatic. Pt requested to use restroom. Pt able to have small BM. Left in chair with call bell in reach and all needs met.     Mobility Specialist Phone [phone]  

## 2021-08-17 LAB — PHOSPHORUS: Phosphorus: 2.5 mg/dL (ref 2.5–4.6)

## 2021-08-17 LAB — CBC
HCT: 28.9 % — ABNORMAL LOW (ref 36.0–46.0)
Hemoglobin: 8.9 g/dL — ABNORMAL LOW (ref 12.0–15.0)
MCH: 26.7 pg (ref 26.0–34.0)
MCHC: 30.8 g/dL (ref 30.0–36.0)
MCV: 86.8 fL (ref 80.0–100.0)
Platelets: 310 10*3/uL (ref 150–400)
RBC: 3.33 MIL/uL — ABNORMAL LOW (ref 3.87–5.11)
RDW: 15.9 % — ABNORMAL HIGH (ref 11.5–15.5)
WBC: 10.4 10*3/uL (ref 4.0–10.5)
nRBC: 0 % (ref 0.0–0.2)

## 2021-08-17 LAB — COMPREHENSIVE METABOLIC PANEL
ALT: 8 U/L (ref 0–44)
AST: 18 U/L (ref 15–41)
Albumin: 1.9 g/dL — ABNORMAL LOW (ref 3.5–5.0)
Alkaline Phosphatase: 74 U/L (ref 38–126)
Anion gap: 7 (ref 5–15)
BUN: 15 mg/dL (ref 8–23)
CO2: 25 mmol/L (ref 22–32)
Calcium: 7.6 mg/dL — ABNORMAL LOW (ref 8.9–10.3)
Chloride: 102 mmol/L (ref 98–111)
Creatinine, Ser: 1.04 mg/dL — ABNORMAL HIGH (ref 0.44–1.00)
GFR, Estimated: 50 mL/min — ABNORMAL LOW (ref >60)
Glucose, Bld: 110 mg/dL — ABNORMAL HIGH (ref 70–99)
Potassium: 3.1 mmol/L — ABNORMAL LOW (ref 3.5–5.1)
Sodium: 134 mmol/L — ABNORMAL LOW (ref 135–145)
Total Bilirubin: 0.5 mg/dL (ref 0.3–1.2)
Total Protein: 5.1 g/dL — ABNORMAL LOW (ref 6.5–8.1)

## 2021-08-17 LAB — MAGNESIUM: Magnesium: 1.2 mg/dL — ABNORMAL LOW (ref 1.7–2.4)

## 2021-08-17 MED ORDER — MAGNESIUM SULFATE 2 GM/50ML IV SOLN
2.0000 g | Freq: Once | INTRAVENOUS | Status: AC
  Administered 2021-08-17: 2 g via INTRAVENOUS
  Filled 2021-08-17 (×2): qty 50

## 2021-08-17 MED ORDER — POTASSIUM CHLORIDE CRYS ER 20 MEQ PO TBCR
40.0000 meq | EXTENDED_RELEASE_TABLET | Freq: Two times a day (BID) | ORAL | Status: AC
  Administered 2021-08-17: 40 meq via ORAL
  Filled 2021-08-17 (×3): qty 2

## 2021-08-17 MED ORDER — POLYVINYL ALCOHOL 1.4 % OP SOLN
1.0000 [drp] | OPHTHALMIC | Status: DC | PRN
  Administered 2021-08-17: 1 [drp] via OPHTHALMIC
  Filled 2021-08-17: qty 15

## 2021-08-17 MED ORDER — MAGNESIUM OXIDE -MG SUPPLEMENT 400 (240 MG) MG PO TABS
800.0000 mg | ORAL_TABLET | Freq: Every day | ORAL | Status: DC
  Filled 2021-08-17 (×2): qty 2

## 2021-08-17 NOTE — Progress Notes (Signed)
PROGRESS NOTE  [name] EVO:[id number] DOB: 03/05/1927 DOA: 08/14/2021 PCP: [name], NP  HPI/Recap of past 24 hours: Patient with past medical history significant for permanent A. Fib not on OAC, rheumatoid arthritis, hypertension, AAS, history of recurrent UTI with known colovesical fistula from diverticulitis, chronic left hydronephrosis.  Presents with 3 to 4 days worsening dysuria, poor oral intake.  CT abdomen and pelvis showed sigmoid diverticulitis with suspected chronic colovesical fistula.  Chronic left moderate hydronephrosis.  08/17/2021: Seen and examined at beside.  Left lower quadrant abd pain is improved.  No nausea with clear liquid diet.  Advancing her diet.  Assessment/Plan: Principal Problem:   Sepsis secondary to UTI [facility]) Active Problems:   Persistent atrial fibrillation (HCC)   Hydronephrosis   Colovesical fistula   Chronic combined systolic and diastolic congestive heart failure (HCC)   Acute pyelonephritis  Sepsis likely secondary to presumptive UTI and sigmoid diverticulitis. Presented with fever, tachycardia, tachypnea and leukocytosis. Continue Rocephin and IV Flagyl, day#4 Continue IV fluid hydration. Advance diet as tolerated Monitor fever curve and WBC. Procalcitonin 0.61 on 08/15/21, repeat in the AM  Presumptive UTI Urinalysis positive for pyuria Urine culture multiple species, suggested recollection Continue Rocephin empirically  Hypokalemia K+ 3.1, repleted orally Repeat BMP tomorrow  Hypomagnesemia Mag 1.2, repleted intravenously and orally  Chronic left moderate hydronephrosis with history of colovesicular fistula Urology consulted and following. Patient not interested in any kind of surgical intervention including ureteral stent.  Recommendation to treat UTI empirically and to await culture for ID and sensitivities.  Will follow-up with urology outpatient.  Chronic A. Fib, rate controlled Continue home  Toprol-XL 75 mg daily. On Toprol XL for rate control She is not on oral anticoagulation Continue to monitor on telemetry.  Chronic diastolic CHF Euvolemic on exam Diuretics on hold Closely monitor volume status while on IV fluid.  Chronic anxiety on Xanax and gabapentin Resume home regimen.   Code Status: DNR.  Family Communication: None at bedside.  Disposition Plan: Likely will discharge to home.   Consultants: Urology.   Procedures: None.  Antimicrobials: Rocephin. Flagyl.  DVT prophylaxis: Subcu Lovenox daily.  Status is: Inpatient  Inpatient status.  Patient will require at least 2 midnights for further evaluation and treatment of present condition.        Objective: Vitals:   08/16/21 2304 08/17/21 0328 08/17/21 0836 08/17/21 1205  BP: 114/65 131/73 (!) 154/76 101/74  Pulse: 93 98 100 90  Resp: 20 20 16 20   Temp: 98.2 F (36.8 C) 98.1 F (36.7 C) 98.5 F (36.9 C) 98.2 F (36.8 C)  TempSrc: Oral Oral Oral Oral  SpO2: 97% 97% 99% 100%  Weight:      Height:        Intake/Output Summary (Last 24 hours) at 08/17/2021 1345 Last data filed at 08/17/2021 1043 Gross per 24 hour  Intake 360 ml  Output 1300 ml  Net -940 ml   Filed Weights   08/14/21 2110  Weight: 49.9 kg    Exam:  General: 87 y.o. year-old female Well developed well nourished in no acute distress.  She is alert and oriented x 3. Cardiovascular: IRRR no rubs or gallops.  No JVD or thyromegaly Respiratory: CTA no wheezes or rales.  Good inspirational efforts. Abdomen: Soft mildly tender with palpation left lower abdominal quadrant. Musculoskeletal: Trace lower extremity edema bilaterally. Skin:No ulcerative lesions or rashes noted. Psychiatry: Mood is appropriate for condition and setting.   Data Reviewed: CBC: Recent Labs  Lab 08/14/21 2110 08/15/21 0612 08/17/21 0158  WBC 17.2* 13.1* 10.4  NEUTROABS 15.1*  --   --   HGB 10.0* 9.0* 8.9*  HCT 32.1* 29.4* 28.9*   MCV 86.5 86.7 86.8  PLT 314 261 185   Basic Metabolic Panel: Recent Labs  Lab 08/14/21 2110 08/15/21 0612 08/17/21 0158  NA 134* 134* 134*  K 3.3* 4.1 3.1*  CL 103 103 102  CO2 20* 24 25  GLUCOSE 124* 120* 110*  BUN 20 19 15   CREATININE 1.15* 1.12* 1.04*  CALCIUM 7.8* 7.8* 7.6*  MG  --   --  1.2*  PHOS  --   --  2.5   GFR: Estimated Creatinine Clearance: 24.8 mL/min (A) (by C-G formula based on SCr of 1.04 mg/dL (H)). Liver Function Tests: Recent Labs  Lab 08/14/21 2110 08/15/21 0612 08/17/21 0158  AST 24 22 18   ALT 8 9 8   ALKPHOS 112 91 74  BILITOT 1.4* 0.7 0.5  PROT 6.3* 5.6* 5.1*  ALBUMIN 2.4* 2.1* 1.9*   No results for input(s): LIPASE, AMYLASE in the last 168 hours. No results for input(s): AMMONIA in the last 168 hours. Coagulation Profile: Recent Labs  Lab 08/14/21 2110 08/15/21 0612  INR 1.3* 1.3*   Cardiac Enzymes: No results for input(s): CKTOTAL, CKMB, CKMBINDEX, TROPONINI in the last 168 hours. BNP (last 3 results) No results for input(s): PROBNP in the last 8760 hours. HbA1C: No results for input(s): HGBA1C in the last 72 hours. CBG: Recent Labs  Lab 08/14/21 2108  GLUCAP 124*   Lipid Profile: No results for input(s): CHOL, HDL, LDLCALC, TRIG, CHOLHDL, LDLDIRECT in the last 72 hours. Thyroid Function Tests: No results for input(s): TSH, T4TOTAL, FREET4, T3FREE, THYROIDAB in the last 72 hours. Anemia Panel: No results for input(s): VITAMINB12, FOLATE, FERRITIN, TIBC, IRON, RETICCTPCT in the last 72 hours. Urine analysis:    Component Value Date/Time   COLORURINE YELLOW 08/14/2021 2154   APPEARANCEUR TURBID (A) 08/14/2021 2154   LABSPEC 1.014 08/14/2021 2154   PHURINE 5.0 08/14/2021 2154   GLUCOSEU NEGATIVE 08/14/2021 2154   HGBUR MODERATE (A) 08/14/2021 2154   BILIRUBINUR NEGATIVE 08/14/2021 2154   KETONESUR 5 (A) 08/14/2021 2154   PROTEINUR 100 (A) 08/14/2021 2154   UROBILINOGEN 0.2 07/03/2014 0313   NITRITE NEGATIVE 08/14/2021  2154   LEUKOCYTESUR LARGE (A) 08/14/2021 2154   Sepsis Labs: @LABRCNTIP (procalcitonin:4,lacticidven:4)  ) Recent Results (from the past 240 hour(s))  Blood Culture (routine x 2)     Status: None (Preliminary result)   Collection Time: 08/14/21  9:10 PM   Specimen: BLOOD LEFT ARM  Result Value Ref Range Status   Specimen Description BLOOD LEFT ARM  Final   Special Requests   Final    BOTTLES DRAWN AEROBIC AND ANAEROBIC Blood Culture adequate volume   Culture   Final    NO GROWTH 3 DAYS Performed at [facility], [place] [address], [place], [place] [address]    Report Status PENDING  Incomplete  Blood Culture (routine x 2)     Status: None (Preliminary result)   Collection Time: 08/14/21  9:50 PM   Specimen: BLOOD  Result Value Ref Range Status   Specimen Description BLOOD RIGHT ANTECUBITAL  Final   Special Requests   Final    BOTTLES DRAWN AEROBIC AND ANAEROBIC Blood Culture adequate volume   Culture   Final    NO GROWTH 3 DAYS Performed at [facility], [place] [address] [place], [place]  [address]    Report Status PENDING  Incomplete  Urine Culture     Status: Abnormal   Collection Time: 08/14/21 10:40 PM   Specimen: In/Out Cath Urine  Result Value Ref Range Status   Specimen Description IN/OUT CATH URINE  Final   Special Requests   Final    NONE Performed at [facility], [place] [address], [place], [place] [address]    Culture MULTIPLE SPECIES PRESENT, SUGGEST RECOLLECTION (A)  Final   Report Status 08/15/2021 FINAL  Final      Studies: No results found.  Scheduled Meds:  ALPRAZolam  0.25 mg Oral QHS   enoxaparin (LOVENOX) injection  30 mg Subcutaneous Q24H   gabapentin  100 mg Oral Daily   magnesium oxide  800 mg Oral Daily   metoprolol succinate  75 mg Oral Daily   potassium chloride  40 mEq Oral BID    Continuous Infusions:  cefTRIAXone (ROCEPHIN)  IV 2 g (08/16/21 2331)   metronidazole 500 mg (08/17/21 0657)     LOS: 2 days     Kayleen Memos, MD [facility] Pager [phone]  If 7PM-7AM, please contact night-coverage [email or web address] Password [email or web address] 08/17/2021, 1:45 PM

## 2021-08-17 NOTE — Progress Notes (Signed)
Mobility Specialist Progress Note:   08/17/21 1100  Mobility  Activity Ambulated in hall  Level of Assistance Standby assist, set-up cues, supervision of patient - no hands on  Assistive Device Front wheel walker  Distance Ambulated (ft) 250 ft  Mobility Ambulated with assistance in hallway  Mobility Response Tolerated well  Mobility performed by Mobility specialist  Bed Position Chair  $Mobility charge 1 Mobility   Pre- Mobility:  86 HR During Mobility: 107 HR Post Mobility:   96 HR   Pt received in bed willing to participate in mobility. No complaints of pain and asymptomatic. Pt returned to chair with call bell in reach and all needs met.   [facility] Health and safety inspector Phone [phone]

## 2021-08-18 LAB — CBC
HCT: 28.2 % — ABNORMAL LOW (ref 36.0–46.0)
Hemoglobin: 8.7 g/dL — ABNORMAL LOW (ref 12.0–15.0)
MCH: 26.8 pg (ref 26.0–34.0)
MCHC: 30.9 g/dL (ref 30.0–36.0)
MCV: 86.8 fL (ref 80.0–100.0)
Platelets: 283 10*3/uL (ref 150–400)
RBC: 3.25 MIL/uL — ABNORMAL LOW (ref 3.87–5.11)
RDW: 15.9 % — ABNORMAL HIGH (ref 11.5–15.5)
WBC: 9.6 10*3/uL (ref 4.0–10.5)
nRBC: 0 % (ref 0.0–0.2)

## 2021-08-18 LAB — BASIC METABOLIC PANEL
Anion gap: 7 (ref 5–15)
BUN: 12 mg/dL (ref 8–23)
CO2: 24 mmol/L (ref 22–32)
Calcium: 7.6 mg/dL — ABNORMAL LOW (ref 8.9–10.3)
Chloride: 104 mmol/L (ref 98–111)
Creatinine, Ser: 0.9 mg/dL (ref 0.44–1.00)
GFR, Estimated: 60 mL/min — ABNORMAL LOW (ref >60)
Glucose, Bld: 97 mg/dL (ref 70–99)
Potassium: 3.5 mmol/L (ref 3.5–5.1)
Sodium: 135 mmol/L (ref 135–145)

## 2021-08-18 LAB — MAGNESIUM: Magnesium: 1.7 mg/dL (ref 1.7–2.4)

## 2021-08-18 MED ORDER — AMOXICILLIN-POT CLAVULANATE 875-125 MG PO TABS: 1.0000 | ORAL_TABLET | Freq: Two times a day (BID) | ORAL | 0 refills | Status: AC

## 2021-08-18 MED ORDER — AMOXICILLIN-POT CLAVULANATE 875-125 MG PO TABS: 1.0000 | ORAL_TABLET | Freq: Two times a day (BID) | ORAL | Status: DC

## 2021-08-18 NOTE — Evaluation (Signed)
Physical Therapy Evaluation/Discharge Patient Details Name: Harmony Sandell MRN: [id number] DOB: 1927-02-14 Today's Date: 08/18/2021  History of Present Illness  Pt is a 87 y.o. female who presented 08/14/21 with dysuria, urinary urgency, frequency, fever, and L flank pain. Pt admitted with sepsis likely secondary to presumptive UTI and sigmoid diverticulitis. PMH: A. Fib not on [place], rheumatoid arthritis, hypertension, AAS, history of recurrent UTI with known colovesical fistula from diverticulitis, chronic left hydronephrosis   Clinical Impression  Pt presents with condition above. PTA, she was mod I using furniture for support with household mobility and using a RW in the community. Pt lives with her son, who can assist her 24/7, in a 1-level condo with a level entry. Currently, pt is able to perform all functional mobility with a RW at a supervision - mod I level, reporting this is her baseline. Pt denies any recent falls and displays steady gait, even in narrow spaces, when using her RW this date. All education completed and questions answered. Encouraged pt to continue to mobilize with nursing. PT will sign off.       Recommendations for follow up therapy are one component of a multi-disciplinary discharge planning process, led by the attending physician.  Recommendations may be updated based on patient status, additional functional criteria and insurance authorization.  Follow Up Recommendations No PT follow up;Supervision for mobility/OOB    Equipment Recommendations  None recommended by PT    Recommendations for Other Services       Precautions / Restrictions Precautions Precautions: Fall (low) Precaution Comments: urinary and bowel incontinence Restrictions Weight Bearing Restrictions: No      Mobility  Bed Mobility Overal bed mobility: Modified Independent             General bed mobility comments: Pt using bed rails with HOB elevated to roll and transition to  sit EOB.    Transfers Overall transfer level: Needs assistance Equipment used: Rolling walker (2 wheeled) Transfers: Sit to/from Stand Sit to Stand: Supervision         General transfer comment: Supervision for safety, no LOB. Cues to reach back for chair prior to sitting.  Ambulation/Gait Ambulation/Gait assistance: Supervision Gait Distance (Feet): 45 Feet Assistive device: Rolling walker (2 wheeled) Gait Pattern/deviations: Step-through pattern;Decreased stride length Gait velocity: reduced Gait velocity interpretation: 1.31 - 2.62 ft/sec, indicative of limited community ambulator General Gait Details: Pt with slow, but steady gait. No LOB, supervision for safety. Manages RW well, even in tight spaces.  Stairs            Wheelchair Mobility    Modified Rankin (Stroke Patients Only)       Balance Overall balance assessment: Needs assistance Sitting-balance support: No upper extremity supported;Feet supported Sitting balance-Leahy Scale: Good     Standing balance support: Bilateral upper extremity supported;During functional activity Standing balance-Leahy Scale: Poor Standing balance comment: Reliant on UE support for mobility.                             Pertinent Vitals/Pain Pain Assessment: Faces Faces Pain Scale: Hurts a little bit Pain Location: grimacing with pericare Pain Descriptors / Indicators: Grimacing Pain Intervention(s): Limited activity within patient's tolerance;Monitored during session;Repositioned;Patient requesting pain meds-RN notified;Other (comment) (applied barrier cream)    Home Living Family/patient expects to be discharged to:: Private residence Living Arrangements: Children (son) Available Help at Discharge: Family;Available 24 hours/day Type of Home: House (condo) Home Access: Level entry  Home Layout: One level Home Equipment: Walker - 2 wheels;Shower seat - built in;Grab bars - tub/shower      Prior  Function Level of Independence: Needs assistance   Gait / Transfers Assistance Needed: Uses RW outside home but uses furniture inside home due to difficulty rolling walker on carpet.  ADL's / Homemaking Assistance Needed: Pt reports son assists with household chores but she can intermittently cook. reports she is able to bathe and dress herself. Reports urinary and bowel incontinence at baseline, uses depends at home  Comments: Denies any recent falls     Hand Dominance   Dominant Hand: Right    Extremity/Trunk Assessment   Upper Extremity Assessment Upper Extremity Assessment: Defer to OT evaluation    Lower Extremity Assessment Lower Extremity Assessment: Overall WFL for tasks assessed (MMT scores of 4 to 5 grossly bil; denies numbness/tingling)    Cervical / Trunk Assessment Cervical / Trunk Assessment: Kyphotic  Communication   Communication: No difficulties  Cognition Arousal/Alertness: Awake/alert Behavior During Therapy: WFL for tasks assessed/performed Overall Cognitive Status: Within Functional Limits for tasks assessed                                        General Comments General comments (skin integrity, edema, etc.): Encouraged pt mobilizing with nursing    Exercises     Assessment/Plan    PT Assessment Patent does not need any further PT services  PT Problem List         PT Treatment Interventions      PT Goals (Current goals can be found in the Care Plan section)  Acute Rehab PT Goals Patient Stated Goal: to go home today PT Goal Formulation: All assessment and education complete, DC therapy Time For Goal Achievement: 08/19/21 Potential to Achieve Goals: Good    Frequency     Barriers to discharge        Co-evaluation               AM-PAC PT "6 Clicks" Mobility  Outcome Measure Help needed turning from your back to your side while in a flat bed without using bedrails?: None Help needed moving from lying on your  back to sitting on the side of a flat bed without using bedrails?: None Help needed moving to and from a bed to a chair (including a wheelchair)?: A Little Help needed standing up from a chair using your arms (e.g., wheelchair or bedside chair)?: A Little Help needed to walk in hospital room?: A Little Help needed climbing 3-5 steps with a railing? : A Little 6 Click Score: 20    End of Session   Activity Tolerance: Patient tolerated treatment well Patient left: in chair;with call bell/phone within reach;with chair alarm set Nurse Communication: Mobility status PT Visit Diagnosis: Other abnormalities of gait and mobility (R26.89)    Time: 9244-6286 PT Time Calculation (min) (ACUTE ONLY): 32 min   Charges:   PT Evaluation $PT Eval Moderate Complexity: 1 Mod PT Treatments $Therapeutic Activity: 8-22 mins        Moishe Spice, PT, DPT Acute Rehabilitation Services  Pager: [phone] Office: [place] 08/18/2021, 8:53 AM

## 2021-08-18 NOTE — Plan of Care (Signed)

## 2021-08-18 NOTE — Evaluation (Signed)
Occupational Therapy Evaluation Patient Details Name: [name] MRN: [id number] DOB: [birth year] Today's Date: 08/18/2021   History of Present Illness Pt is a 87 y.o. female who presented 08/14/21 with dysuria, urinary urgency, frequency, fever, and L flank pain. Pt admitted with sepsis likely secondary to presumptive UTI and sigmoid diverticulitis. PMH: A. Fib not on OAC, rheumatoid arthritis, hypertension, AAS, history of recurrent UTI with known colovesical fistula from diverticulitis, chronic left hydronephrosis   Clinical Impression   PTA, pt lives with son and reports Modified Independence with ADLs, some household IADLs and mobility (furniture walking indoors and RW in community). Initially, pt reporting to PT desire for ADL completion this AM. OT entered shortly after with pt reporting too fatigued to attempt ADLs at sink as initially planned despite task modification offers, so limited eval completed. Pt able to demo functional skills to reach B feet for LB ADLs completion with Min A at most. Noted pt steady with RW use from PT session earlier. Pt reports feeling close to baseline with no OT needs anticipated at DC. Due to limited eval, will continue to follow acutely to ensure safety with ADL/mobility completion.     Recommendations for follow up therapy are one component of a multi-disciplinary discharge planning process, led by the attending physician.  Recommendations may be updated based on patient status, additional functional criteria and insurance authorization.   Follow Up Recommendations  No OT follow up;Supervision - Intermittent    Equipment Recommendations  None recommended by OT    Recommendations for Other Services       Precautions / Restrictions Precautions Precautions: Fall Precaution Comments: urinary and bowel incontinence, relatively low fall risk Restrictions Weight Bearing Restrictions: No      Mobility Bed Mobility Overal bed mobility:  Modified Independent             General bed mobility comments: received in chair    Transfers Overall transfer level: Needs assistance Equipment used: Rolling walker (2 wheeled) Transfers: Sit to/from Stand Sit to Stand: Supervision         General transfer comment: declined to attempt due to awaiting breakfast    Balance Overall balance assessment: Needs assistance Sitting-balance support: No upper extremity supported;Feet supported Sitting balance-Leahy Scale: Good     Standing balance support: Bilateral upper extremity supported;During functional activity Standing balance-Leahy Scale: Poor Standing balance comment: Reliant on UE support for mobility.                           ADL either performed or assessed with clinical judgement   ADL Overall ADL's : At baseline                                       General ADL Comments: Initially pt reported desire for bathing task to PT. On OT entry shortly after, pt reports feeling too tired to do any of that despite offering for energy conservation and modification of tasks. Pt denies any issues with ADLs though declined to attempt many tasks. ABle to don socks with assist (able to reach feet) but reports typically just wearing bedroom slippers at home. Pt did require some assist to open containers on breakfast tray. reports son at home 24/7 and can assist whenever needed     Vision Baseline Vision/History: 1 Wears glasses Ability to See in Adequate Light: 0 Adequate Patient Visual  Report: No change from baseline Vision Assessment?: No apparent visual deficits     Perception     Praxis      Pertinent Vitals/Pain Pain Assessment: Faces Faces Pain Scale: No hurt Pain Location: grimacing with pericare Pain Descriptors / Indicators: Grimacing Pain Intervention(s): Monitored during session     Hand Dominance Right   Extremity/Trunk Assessment Upper Extremity Assessment Upper Extremity  Assessment: Overall WFL for tasks assessed   Lower Extremity Assessment Lower Extremity Assessment: Defer to PT evaluation   Cervical / Trunk Assessment Cervical / Trunk Assessment: Kyphotic   Communication Communication Communication: No difficulties   Cognition Arousal/Alertness: Awake/alert Behavior During Therapy: WFL for tasks assessed/performed Overall Cognitive Status: Within Functional Limits for tasks assessed                                     General Comments  Encouraged pt mobilizing with nursing    Exercises     Shoulder Instructions      Home Living Family/patient expects to be discharged to:: Private residence Living Arrangements: Children Available Help at Discharge: Family;Available 24 hours/day Type of Home: Other(Comment) (condo) Home Access: Level entry     Home Layout: One level     Bathroom Shower/Tub: Occupational psychologist: Handicapped height     Home Equipment: Environmental consultant - 2 wheels;Shower seat - built in;Grab bars - tub/shower          Prior Functioning/Environment Level of Independence: Needs assistance  Gait / Transfers Assistance Needed: Uses RW outside home but uses furniture inside home due to difficulty rolling walker on carpet. ADL's / Homemaking Assistance Needed: Pt reports son assists with household chores but she can intermittently cook. reports she is able to bathe and dress herself. Reports urinary and bowel incontinence at baseline, uses depends at home   Comments: Denies any recent falls        OT Problem List: Decreased activity tolerance;Impaired balance (sitting and/or standing)      OT Treatment/Interventions: Self-care/ADL training;Therapeutic exercise;Energy conservation;DME and/or AE instruction;Therapeutic activities;Patient/family education;Balance training    OT Goals(Current goals can be found in the care plan section) Acute Rehab OT Goals Patient Stated Goal: to go home today OT  Goal Formulation: With patient Time For Goal Achievement: 09/01/21 Potential to Achieve Goals: Good  OT Frequency: Min 2X/week   Barriers to D/C:            Co-evaluation              AM-PAC OT "6 Clicks" Daily Activity     Outcome Measure Help from another person eating meals?: A Little Help from another person taking care of personal grooming?: A Little Help from another person toileting, which includes using toliet, bedpan, or urinal?: A Little Help from another person bathing (including washing, rinsing, drying)?: A Little Help from another person to put on and taking off regular upper body clothing?: A Little Help from another person to put on and taking off regular lower body clothing?: A Little 6 Click Score: 18   End of Session Nurse Communication: Mobility status  Activity Tolerance: Other (comment) (self limiting) Patient left: in chair;with call bell/phone within reach;with chair alarm set  OT Visit Diagnosis: Unsteadiness on feet (R26.81)                Time: 8182-9937 OT Time Calculation (min): 16 min Charges:  OT General Charges $OT  Visit: 1 Visit OT Evaluation $OT Eval Low Complexity: 1 Low  Malachy Chamber, OTR/L Acute Rehab Services Office: [phone]   Layla Maw 08/18/2021, 9:33 AM

## 2021-08-18 NOTE — Discharge Summary (Signed)
Discharge Summary  [name] WUJ:[id number] DOB: 09/15/1927  PCP: [name], NP  Admit date: 08/14/2021 Discharge date: 08/18/2021  Time spent: 35 minutes  Recommendations for Outpatient Follow-up:  Follow-up with your primary care provider Follow-up with urology.  Discharge Diagnoses:  Active Hospital Problems   Diagnosis Date Noted   Sepsis secondary to UTI ([place]) 07/24/2018   Acute pyelonephritis 08/15/2021   Chronic combined systolic and diastolic congestive heart failure ([place]) 04/21/2020   Colovesical fistula 05/28/2019   Hydronephrosis 10/13/2018   Persistent atrial fibrillation ([place]) 05/11/2014    Resolved Hospital Problems  No resolved problems to display.    Discharge Condition: Stable  Diet recommendation: Resume previous diet.  Vitals:   08/18/21 0734 08/18/21 1224  BP: (!) 150/91 (!) 121/97  Pulse: 80 87  Resp: 18 20  Temp: 98.2 F (36.8 C) 97.9 F (36.6 C)  SpO2: 96% 98%    History of present illness:  Patient with past medical history significant for permanent A. Fib not on OAC, rheumatoid arthritis, hypertension, AAS, history of recurrent UTI with known colovesical fistula from diverticulitis, chronic left hydronephrosis.  Presents with 3 to 4 days worsening dysuria, poor oral intake.  CT abdomen and pelvis showed sigmoid diverticulitis with suspected chronic colovesical fistula.  Chronic left moderate hydronephrosis.   08/18/2021: Seen and examined at her bedside.  She has been tolerating a soft diet.  She had a bowel movement.  Denies nausea.  She has minimal left lower quadrant abdominal pain with moderate palpation.  Hospital Course:  Principal Problem:   Sepsis secondary to UTI [facility]) Active Problems:   Persistent atrial fibrillation (HCC)   Hydronephrosis   Colovesical fistula   Chronic combined systolic and diastolic congestive heart failure (HCC)   Acute pyelonephritis  Resolved sepsis secondary to sigmoid  diverticulitis Presented with fever, tachycardia, tachypnea and leukocytosis. Completed 5 days of Rocephin and IV Flagyl. Switched to Augmentin on 08/18/2021. Follow-up with your PCP.   Presumptive UTI Urinalysis positive for pyuria Urine culture multiple species, suggested recollection Continue Rocephin empirically   Resolved post repletion: Hypokalemia Potassium 3.1> 3.5  Resolved post repletion: Hypomagnesemia Mag 1.2>1.7   Chronic left moderate hydronephrosis with history of colovesicular fistula Seen by urology Patient not interested in any kind of surgical intervention including ureteral stent.  Recommendation to treat UTI empirically and to await culture for ID and sensitivities.   Will follow-up with urology outpatient.   Chronic A. Fib, rate controlled Continue home Toprol-XL 75 mg daily.   Chronic diastolic CHF Euvolemic on exam   Chronic anxiety on Xanax and gabapentin Resume home regimen.     Code Status: DNR.    Consultants: Urology.    Procedures: None.   Antimicrobials: Rocephin, DC'd on 08/18/2021. Flagyl, DC'd on 08/18/2021.  Discharge Exam: BP (!) 121/97 (BP Location: Left Arm)   Pulse 87   Temp 97.9 F (36.6 C) (Oral)   Resp 20   Ht 5' (1.524 m)   Wt 49.9 kg   SpO2 98%   BMI 21.48 kg/m  General: 87 y.o. year-old female well developed well nourished in no acute distress.  Alert and oriented x3. Cardiovascular: Regular rate and rhythm with no rubs or gallops.  No thyromegaly or JVD noted.   Respiratory: Clear to auscultation with no wheezes or rales. Good inspiratory effort. Abdomen: Soft nontender nondistended with normal bowel sounds x4 quadrants. Musculoskeletal: No lower extremity edema. 2/4 pulses in all 4 extremities. Skin: No ulcerative lesions noted or rashes, Psychiatry:  Mood is appropriate for condition and setting  Discharge Instructions You were cared for by a hospitalist during your hospital stay. If you have any questions  about your discharge medications or the care you received while you were in the hospital after you are discharged, you can call the unit and asked to speak with the hospitalist on call if the hospitalist that took care of you is not available. Once you are discharged, your primary care physician will handle any further medical issues. Please note that NO REFILLS for any discharge medications will be authorized once you are discharged, as it is imperative that you return to your primary care physician (or establish a relationship with a primary care physician if you do not have one) for your aftercare needs so that they can reassess your need for medications and monitor your lab values.   Allergies as of 08/18/2021       Reactions   Infliximab Other (See Comments)   REACTION: "Enlarged intestines and hernia. Also pushed intestines on lower part of lungs." REACTION: "Enlarged intestines and hernia. Also pushed intestines on lower part of lungs."   Aspirin Nausea And Vomiting   Low Dose is ok Stomach problems   Carvedilol Other (See Comments)   Fatigue/ ill Fatigue/ ill   Codeine Nausea And Vomiting   "Deathly Sick"   Contrast Media [iodinated Diagnostic Agents] Other (See Comments)   All over body pain   Penicillins Rash   Has patient had a PCN reaction causing immediate rash, facial/tongue/throat swelling, SOB or lightheadedness with hypotension: Y Has patient had a PCN reaction causing severe rash involving mucus membranes or skin necrosis: Y Has patient had a PCN reaction that required hospitalization: N Has patient had a PCN reaction occurring within the last 10 years: N If all of the above answers are "NO", then may proceed with Cephalosporin use.        Medication List     TAKE these medications    acetaminophen 500 MG tablet Commonly known as: TYLENOL Take 1,000 mg by mouth every 8 (eight) hours as needed for moderate pain (joint pain).   ALPRAZolam 0.25 MG tablet Commonly  known as: XANAX TAKE ONE TABLET BY MOUTH EVERY NIGHT AT BEDTIME AS NEEDED FOR ANXIETY What changed: See the new instructions.   amoxicillin-clavulanate 875-125 MG tablet Commonly known as: AUGMENTIN Take 1 tablet by mouth every 12 (twelve) hours for 7 days.   aspirin 81 MG chewable tablet Chew 1 tablet (81 mg total) by mouth daily.   gabapentin 100 MG capsule Commonly known as: NEURONTIN Take 1 capsule (100 mg total) by mouth daily.   HYDROcodone-acetaminophen 5-325 MG tablet Commonly known as: NORCO/VICODIN Take 1 tablet by mouth every 6 (six) hours as needed for moderate pain.   metoprolol succinate 25 MG 24 hr tablet Commonly known as: TOPROL-XL Take 3 tablets (75 mg total) by mouth daily.       Allergies  Allergen Reactions   Infliximab Other (See Comments)    REACTION: "Enlarged intestines and hernia. Also pushed intestines on lower part of lungs." REACTION: "Enlarged intestines and hernia. Also pushed intestines on lower part of lungs."   Aspirin Nausea And Vomiting    Low Dose is ok Stomach problems   Carvedilol Other (See Comments)    Fatigue/ ill Fatigue/ ill   Codeine Nausea And Vomiting    "Deathly Sick"   Contrast Media [Iodinated Diagnostic Agents] Other (See Comments)    All over body pain  Penicillins Rash    Has patient had a PCN reaction causing immediate rash, facial/tongue/throat swelling, SOB or lightheadedness with hypotension: Y Has patient had a PCN reaction causing severe rash involving mucus membranes or skin necrosis: Y Has patient had a PCN reaction that required hospitalization: N Has patient had a PCN reaction occurring within the last 10 years: N If all of the above answers are "NO", then may proceed with Cephalosporin use.     Follow-up Information     Nche, Charlene Brooke, NP. Call today.   Specialty: Internal Medicine Why: Please call for a posthospital follow-up appointment. Contact information: [place] [address] [phone]         Geralynn Rile, MD .   Specialties: Cardiology, Internal Medicine, Radiology Contact information: [place] [address] [phone]         Irine Seal, MD. Call today.   Specialty: Urology Why: Please call for a post hospital follow-up appointment. Contact information: [place]  [address] [phone]                  The results of significant diagnostics from this hospitalization (including imaging, microbiology, ancillary and laboratory) are listed below for reference.    Significant Diagnostic Studies: DG Chest Port 1 View  Result Date: 08/14/2021 CLINICAL DATA:  Possible sepsis EXAM: PORTABLE CHEST 1 VIEW COMPARISON:  08/12/2020 FINDINGS: Cardiac shadow is enlarged but stable. Hiatal hernia is again seen. Aortic calcifications are noted. No focal infiltrate or effusion is noted. No acute bony abnormality is seen. IMPRESSION: No acute abnormality noted. Electronically Signed   By: Inez Catalina M.D.   On: 08/14/2021 21:48   CT RENAL STONE STUDY  Addendum Date: 08/15/2021   ADDENDUM REPORT: 08/15/2021 12:38 ADDENDUM: On additional review, the moderate left hydroureteronephrosis is stable across multiple priors, likely secondary to extrinsic compression from the pelvic inflammatory process. Findings involving the sigmoid colon, bladder, and left ureter are all chronic. Electronically Signed   By: Julian Hy M.D.   On: 08/15/2021 12:38   Result Date: 08/15/2021 CLINICAL DATA:  Sepsis, dysuria, UTI, flank pain. EXAM: CT ABDOMEN AND PELVIS WITHOUT CONTRAST TECHNIQUE: Multidetector CT imaging of the abdomen and pelvis was performed following the standard protocol without IV contrast. COMPARISON:  02/22/2021 FINDINGS: Lower chest: Small bilateral pleural effusions. Compressive atelectasis in the medial bilateral lower lobes. Hepatobiliary: Unenhanced liver is notable for a 9 mm  cyst in the posterior left hepatic lobe (series 3/image 17). Gallbladder is unremarkable. No intrahepatic or extrahepatic duct dilatation. Pancreas: Parenchymal atrophy. Spleen: Within normal limits. Adrenals/Urinary Tract: Adrenal glands are within normal limits. Right kidney is within normal limits. Left kidney is notable for moderate hydroureteronephrosis, new. This may be on the basis of extrinsic compression from colonic inflammation (described below) versus ascending infection from cystitis. Thick-walled bladder with nondependent gas, suggesting cystitis. Stomach/Bowel: Large hiatal hernia/inverted intrathoracic stomach, incompletely visualized. No evidence of bowel obstruction. Appendix is not discretely visualized, reportedly surgically absent. Sigmoid diverticulosis with pericolonic inflammatory changes/stranding (series 3/image 57), suggesting sigmoid diverticulitis, similar to priors. Again noted is a fluid and gas collection extending from the sigmoid colon to the left bladder wall (series 3/image 64), suggesting a colovesical fistula. Vascular/Lymphatic: No evidence of abdominal aortic aneurysm. Atherosclerotic calcifications of the abdominal aorta and branch vessels. No suspicious abdominopelvic lymphadenopathy. Reproductive: Uterus is displaced to the right. Right ovary is within normal limits. Left ovary is notable for multiple cysts measuring up  to 3.6 cm (series 3/image 52), chronic, of questionable clinical utility in this patient. Other: No abdominopelvic ascites. No free air. Small fat containing bilateral inguinal hernias (series 3/image 70), chronic. Musculoskeletal: Degenerative changes of the visualized thoracolumbar spine. IMPRESSION: Sigmoid diverticulitis with suspected chronic colovesical fistula. Associated thick-walled bladder with nondependent gas, suggesting cystitis. These findings are similar to prior studies. Moderate left hydroureteronephrosis, new. This may be on the basis of  extrinsic compression from colonic inflammation versus ascending infection due to cystitis. No evidence of renal or ureteral calculi. Small bilateral pleural effusions. Additional ancillary findings as above. Electronically Signed: By: Julian Hy M.D. On: 08/15/2021 02:46    Microbiology: Recent Results (from the past 240 hour(s))  Blood Culture (routine x 2)     Status: None (Preliminary result)   Collection Time: 08/14/21  9:10 PM   Specimen: BLOOD LEFT ARM  Result Value Ref Range Status   Specimen Description BLOOD LEFT ARM  Final   Special Requests   Final    BOTTLES DRAWN AEROBIC AND ANAEROBIC Blood Culture adequate volume   Culture   Final    NO GROWTH 4 DAYS Performed at [facility], [place] [address], [place], [place] [address]    Report Status PENDING  Incomplete  Blood Culture (routine x 2)     Status: None (Preliminary result)   Collection Time: 08/14/21  9:50 PM   Specimen: BLOOD  Result Value Ref Range Status   Specimen Description BLOOD RIGHT ANTECUBITAL  Final   Special Requests   Final    BOTTLES DRAWN AEROBIC AND ANAEROBIC Blood Culture adequate volume   Culture   Final    NO GROWTH 4 DAYS Performed at [facility], [place] [address], [place], [place] [address]    Report Status PENDING  Incomplete  Urine Culture     Status: Abnormal   Collection Time: 08/14/21 10:40 PM   Specimen: In/Out Cath Urine  Result Value Ref Range Status   Specimen Description IN/OUT CATH URINE  Final   Special Requests   Final    NONE Performed at [facility], [place] [address], [place], [place] [address]    Culture MULTIPLE SPECIES PRESENT, SUGGEST RECOLLECTION (A)  Final   Report Status 08/15/2021 FINAL  Final     Labs: Basic Metabolic Panel: Recent Labs  Lab 08/14/21 2110 08/15/21 0612 08/17/21 0158 08/18/21 0649  NA 134* 134* 134* 135  K 3.3* 4.1 3.1* 3.5  CL 103 103 102 104  CO2 20* 24 25 24   GLUCOSE 124* 120* 110* 97  BUN 20 19 15 12   CREATININE  1.15* 1.12* 1.04* 0.90  CALCIUM 7.8* 7.8* 7.6* 7.6*  MG  --   --  1.2* 1.7  PHOS  --   --  2.5  --    Liver Function Tests: Recent Labs  Lab 08/14/21 2110 08/15/21 0612 08/17/21 0158  AST 24 22 18   ALT 8 9 8   ALKPHOS 112 91 74  BILITOT 1.4* 0.7 0.5  PROT 6.3* 5.6* 5.1*  ALBUMIN 2.4* 2.1* 1.9*   No results for input(s): LIPASE, AMYLASE in the last 168 hours. No results for input(s): AMMONIA in the last 168 hours. CBC: Recent Labs  Lab 08/14/21 2110 08/15/21 0612 08/17/21 0158 08/18/21 0649  WBC 17.2* 13.1* 10.4 9.6  NEUTROABS 15.1*  --   --   --   HGB 10.0* 9.0* 8.9* 8.7*  HCT 32.1* 29.4* 28.9* 28.2*  MCV 86.5 86.7 86.8  86.8  PLT 314 261 310 283   Cardiac Enzymes: No results for input(s): CKTOTAL, CKMB, CKMBINDEX, TROPONINI in the last 168 hours. BNP: BNP (last 3 results) No results for input(s): BNP in the last 8760 hours.  ProBNP (last 3 results) No results for input(s): PROBNP in the last 8760 hours.  CBG: Recent Labs  Lab 08/14/21 2108  GLUCAP 124*       Signed:  Kayleen Memos, MD [facility] 08/18/2021, 5:41 PM

## 2021-08-18 NOTE — Progress Notes (Signed)
Mobility Specialist Progress Note:   08/18/21 1058  Mobility  Activity Refused mobility   Pt stated " I already walked today, I'm 89 and don't want to walk again today".  [place] [facility] Health and safety inspector Phone [phone]

## 2021-08-19 LAB — CULTURE, BLOOD (ROUTINE X 2)
Culture: NO GROWTH
Culture: NO GROWTH
Special Requests: ADEQUATE
Special Requests: ADEQUATE

## 2021-08-22 ENCOUNTER — Telehealth: Payer: Self-pay | Admitting: Nurse Practitioner

## 2021-08-29 ENCOUNTER — Inpatient Hospital Stay: Payer: Medicare Other | Admitting: Nurse Practitioner

## 2021-08-30 ENCOUNTER — Emergency Department (HOSPITAL_COMMUNITY)
Admission: EM | Admit: 2021-08-30 | Discharge: 2021-08-31 | Disposition: A | Discharge status: Home / Self Care | Payer: Medicare Other | Attending: Emergency Medicine | Admitting: Emergency Medicine

## 2021-08-30 ENCOUNTER — Encounter (HOSPITAL_COMMUNITY): Payer: Self-pay | Admitting: Emergency Medicine

## 2021-08-30 ENCOUNTER — Other Ambulatory Visit: Payer: Self-pay

## 2021-08-30 ENCOUNTER — Emergency Department (HOSPITAL_COMMUNITY): Payer: Medicare Other

## 2021-08-30 DIAGNOSIS — R1084 Generalized abdominal pain: Secondary | ICD-10-CM | POA: Diagnosis not present

## 2021-08-30 DIAGNOSIS — I110 Hypertensive heart disease with heart failure: Secondary | ICD-10-CM | POA: Insufficient documentation

## 2021-08-30 DIAGNOSIS — Z87891 Personal history of nicotine dependence: Secondary | ICD-10-CM | POA: Diagnosis not present

## 2021-08-30 DIAGNOSIS — R109 Unspecified abdominal pain: Secondary | ICD-10-CM | POA: Insufficient documentation

## 2021-08-30 DIAGNOSIS — A419 Sepsis, unspecified organism: Secondary | ICD-10-CM | POA: Diagnosis not present

## 2021-08-30 DIAGNOSIS — I5042 Chronic combined systolic (congestive) and diastolic (congestive) heart failure: Secondary | ICD-10-CM | POA: Diagnosis not present

## 2021-08-30 DIAGNOSIS — K5732 Diverticulitis of large intestine without perforation or abscess without bleeding: Secondary | ICD-10-CM | POA: Diagnosis not present

## 2021-08-30 DIAGNOSIS — R350 Frequency of micturition: Secondary | ICD-10-CM | POA: Diagnosis not present

## 2021-08-30 DIAGNOSIS — R531 Weakness: Secondary | ICD-10-CM | POA: Diagnosis not present

## 2021-08-30 DIAGNOSIS — Z7982 Long term (current) use of aspirin: Secondary | ICD-10-CM | POA: Diagnosis not present

## 2021-08-30 DIAGNOSIS — E039 Hypothyroidism, unspecified: Secondary | ICD-10-CM | POA: Insufficient documentation

## 2021-08-30 DIAGNOSIS — I10 Essential (primary) hypertension: Secondary | ICD-10-CM | POA: Diagnosis not present

## 2021-08-30 DIAGNOSIS — Z79899 Other long term (current) drug therapy: Secondary | ICD-10-CM | POA: Diagnosis not present

## 2021-08-30 DIAGNOSIS — R630 Anorexia: Secondary | ICD-10-CM | POA: Insufficient documentation

## 2021-08-30 DIAGNOSIS — N390 Urinary tract infection, site not specified: Secondary | ICD-10-CM

## 2021-08-30 DIAGNOSIS — R110 Nausea: Secondary | ICD-10-CM | POA: Diagnosis not present

## 2021-08-30 DIAGNOSIS — N3090 Cystitis, unspecified without hematuria: Secondary | ICD-10-CM | POA: Diagnosis not present

## 2021-08-30 DIAGNOSIS — K449 Diaphragmatic hernia without obstruction or gangrene: Secondary | ICD-10-CM | POA: Diagnosis not present

## 2021-08-30 DIAGNOSIS — N1330 Unspecified hydronephrosis: Secondary | ICD-10-CM | POA: Diagnosis not present

## 2021-08-30 LAB — COMPREHENSIVE METABOLIC PANEL
ALT: 8 U/L (ref 0–44)
AST: 18 U/L (ref 15–41)
Albumin: 3 g/dL — ABNORMAL LOW (ref 3.5–5.0)
Alkaline Phosphatase: 78 U/L (ref 38–126)
Anion gap: 9 (ref 5–15)
BUN: 10 mg/dL (ref 8–23)
CO2: 29 mmol/L (ref 22–32)
Calcium: 8.4 mg/dL — ABNORMAL LOW (ref 8.9–10.3)
Chloride: 97 mmol/L — ABNORMAL LOW (ref 98–111)
Creatinine, Ser: 0.82 mg/dL (ref 0.44–1.00)
GFR, Estimated: >60 mL/min (ref >60)
Glucose, Bld: 119 mg/dL — ABNORMAL HIGH (ref 70–99)
Potassium: 2.9 mmol/L — ABNORMAL LOW (ref 3.5–5.1)
Sodium: 135 mmol/L (ref 135–145)
Total Bilirubin: 0.9 mg/dL (ref 0.3–1.2)
Total Protein: 7.2 g/dL (ref 6.5–8.1)

## 2021-08-30 LAB — CBC WITH DIFFERENTIAL/PLATELET
Abs Immature Granulocytes: 0.05 10*3/uL (ref 0.00–0.07)
Basophils Absolute: 0.1 10*3/uL (ref 0.0–0.1)
Basophils Relative: 1 %
Eosinophils Absolute: 0.1 10*3/uL (ref 0.0–0.5)
Eosinophils Relative: 1 %
HCT: 34.3 % — ABNORMAL LOW (ref 36.0–46.0)
Hemoglobin: 10.5 g/dL — ABNORMAL LOW (ref 12.0–15.0)
Immature Granulocytes: 0 %
Lymphocytes Relative: 12 %
Lymphs Abs: 1.6 10*3/uL (ref 0.7–4.0)
MCH: 26.6 pg (ref 26.0–34.0)
MCHC: 30.6 g/dL (ref 30.0–36.0)
MCV: 86.8 fL (ref 80.0–100.0)
Monocytes Absolute: 0.7 10*3/uL (ref 0.1–1.0)
Monocytes Relative: 5 %
Neutro Abs: 10.8 10*3/uL — ABNORMAL HIGH (ref 1.7–7.7)
Neutrophils Relative %: 81 %
Platelets: 358 10*3/uL (ref 150–400)
RBC: 3.95 MIL/uL (ref 3.87–5.11)
RDW: 17.5 % — ABNORMAL HIGH (ref 11.5–15.5)
WBC: 13.4 10*3/uL — ABNORMAL HIGH (ref 4.0–10.5)
nRBC: 0 % (ref 0.0–0.2)

## 2021-08-30 LAB — URINALYSIS, ROUTINE W REFLEX MICROSCOPIC
Bilirubin Urine: NEGATIVE
Glucose, UA: NEGATIVE mg/dL
Ketones, ur: NEGATIVE mg/dL
Nitrite: NEGATIVE
Protein, ur: 30 mg/dL — AB
Specific Gravity, Urine: 1.012 (ref 1.005–1.030)
WBC, UA: >50 WBC/hpf — ABNORMAL HIGH (ref 0–5)
pH: 7 (ref 5.0–8.0)

## 2021-08-30 LAB — LACTIC ACID, PLASMA: Lactic Acid, Venous: 1.2 mmol/L (ref 0.5–1.9)

## 2021-08-30 MED ORDER — SODIUM CHLORIDE 0.9 % IV BOLUS
1000.0000 mL | Freq: Once | INTRAVENOUS | Status: AC
  Administered 2021-08-30: 1000 mL via INTRAVENOUS

## 2021-08-30 MED ORDER — POTASSIUM CHLORIDE CRYS ER 20 MEQ PO TBCR
40.0000 meq | EXTENDED_RELEASE_TABLET | Freq: Once | ORAL | Status: AC
  Administered 2021-08-30: 40 meq via ORAL
  Filled 2021-08-30: qty 2

## 2021-08-30 MED ORDER — POTASSIUM CHLORIDE 10 MEQ/100ML IV SOLN
10.0000 meq | INTRAVENOUS | Status: AC
  Administered 2021-08-31 (×4): 10 meq via INTRAVENOUS
  Filled 2021-08-30 (×4): qty 100

## 2021-08-30 MED ORDER — MAGNESIUM SULFATE 2 GM/50ML IV SOLN
2.0000 g | Freq: Once | INTRAVENOUS | Status: AC
  Administered 2021-08-31: 2 g via INTRAVENOUS
  Filled 2021-08-30: qty 50

## 2021-08-30 NOTE — ED Triage Notes (Signed)
Pt arrived via EMS from home. Pt has hx of UTI and sepsis. Pt has been non compliant with amoxicillin and has had UTI symptoms for the past 2 weeks. Pt is A&Ox4.

## 2021-08-30 NOTE — Telephone Encounter (Signed)
Pt son states she still needs medication to help with the nausea even after stopping the amoxicillin. Pt appointment with urology is 09/09/21

## 2021-08-30 NOTE — ED Provider Notes (Signed)
F/u on CT imaging If persistent diverticulitis, give zosyn and d/c home to continue augmenting If CT negative, tx UTI with fosfomycin and likely d/c home   Ripley Fraise, MD 08/30/21 2355

## 2021-08-31 ENCOUNTER — Emergency Department (HOSPITAL_COMMUNITY): Payer: Medicare Other

## 2021-08-31 DIAGNOSIS — A419 Sepsis, unspecified organism: Secondary | ICD-10-CM | POA: Diagnosis not present

## 2021-08-31 DIAGNOSIS — R350 Frequency of micturition: Secondary | ICD-10-CM | POA: Diagnosis not present

## 2021-08-31 DIAGNOSIS — K5732 Diverticulitis of large intestine without perforation or abscess without bleeding: Secondary | ICD-10-CM | POA: Diagnosis not present

## 2021-08-31 DIAGNOSIS — N1330 Unspecified hydronephrosis: Secondary | ICD-10-CM | POA: Diagnosis not present

## 2021-08-31 DIAGNOSIS — N3090 Cystitis, unspecified without hematuria: Secondary | ICD-10-CM | POA: Diagnosis not present

## 2021-08-31 LAB — URINE CULTURE

## 2021-08-31 MED ORDER — PIPERACILLIN-TAZOBACTAM 3.375 G IVPB 30 MIN
3.3750 g | Freq: Once | INTRAVENOUS | Status: AC
  Administered 2021-08-31: 3.375 g via INTRAVENOUS
  Filled 2021-08-31: qty 50

## 2021-08-31 MED ORDER — ALPRAZOLAM 0.25 MG PO TABS
0.2500 mg | ORAL_TABLET | Freq: Once | ORAL | Status: AC
  Administered 2021-08-31: 0.25 mg via ORAL
  Filled 2021-08-31: qty 1

## 2021-08-31 NOTE — ED Provider Notes (Signed)
[facility] DEPT Provider Note   CSN: [id number] Arrival date & time: 08/30/21  2102     History Chief Complaint  Patient presents with   Urinary Frequency    [name] is a 87 y.o. female.  HPI     87 year old female comes in with chief complaint of urinary frequency, abdominal pain.  Patient has history of A. fib, CHF and was recently admitted to the hospital for diverticulitis and noted to have colovesicular fistula.  She also has history of ESBL UTI.  During her last admission, she was treated for diverticulitis and questionable UTI.  Patient states that she has not felt great after being discharged.  She continues to feel frail and weak.  She has no appetite.  She continues to have some abdominal discomfort and urinary frequency.  Patient has not taken antibiotics that were prescribed because she does not tolerate them well.  Spoke with patient's son who informs me that patient has not taken 5 days worth of Augmentin.  He has tried to convince her that the antibiotics will not harm her, in fact help her and she refuses.  He has not seen any significant decline in patient's abilities after discharge but does think that home health would be helpful.  Past Medical History:  Diagnosis Date   A-fib ([place])    Acute CHF (congestive heart failure) ([place]) 09/02/2019   Aortic stenosis, moderate    last echo in 2010; AVA 1.1; mild AS per echo in June 2013   Colovesical fistula    Diverticulitis    Edema of both legs    s/p laser treatment per Dr. Donnetta Hutching   GAD (generalized anxiety disorder) 05/20/2020   Hiatal hernia    History of chicken pox    HLD (hyperlipidemia)    HTN (hypertension)    Hypertension 04/19/2012   Hypothyroidism    IBS (irritable bowel syndrome)    Iron deficiency 09/19/2017   Iron deficiency anemia    Iron deficiency anemia 02/22/2020   Localized edema 06/21/2012   Overview:  Vascular surgeon - Dr Donnetta Hutching   Neuropathic pain  09/06/2015   Rheumatoid arthritis [facility])     Patient Active Problem List   Diagnosis Date Noted   Acute pyelonephritis 08/15/2021   Abdominal pain 08/13/2020   Hiatal hernia    Abnormal CT of the abdomen    Chronic combined systolic and diastolic congestive heart failure ([place]) 04/21/2020   Hypoxia 65/78/4696   Metabolic acidosis 29/52/8413   Cystitis 02/10/2020   Aortic atherosclerosis ([place]) 24/40/1027   Complicated UTI (urinary tract infection) 08/21/2019   Colovesical fistula 05/28/2019   Polyneuropathy 11/06/2018   Goals of care, counseling/discussion    Hydronephrosis 10/13/2018   Ovarian mass 10/13/2018   C. difficile colitis 10/13/2018   Clostridium difficile colitis 09/24/2018   Hypomagnesemia    Left ovarian cyst    Acute diverticulitis 09/07/2018   Malnutrition of moderate degree 07/26/2018   Perforation of sigmoid colon due to diverticulitis 07/25/2018   Sepsis secondary to UTI ([place]) 07/24/2018   Generalized abdominal pain 02/06/2018   Paresthesia of both lower extremities 02/06/2018   Anemia 01/07/2018   Increased platelet count 09/18/2017   Pyoderma gangrenosum 01/04/2017   Primary osteoarthritis of both hands 01/04/2017   Primary osteoarthritis of both feet 01/04/2017   Primary osteoarthritis of both knees 01/04/2017   Degenerative joint disease involving multiple joints 01/04/2017   Neuropathic pain 09/06/2015   Erythema nodosum 09/21/2014   Rheumatoid arthritis ([place])  09/21/2014   Benign essential hypertension 09/02/2014   Rectal bleeding 05/15/2014   Persistent atrial fibrillation ([place]) 05/11/2014   Hyperlipidemia 04/02/2014   PAC (premature atrial contraction) 01/23/2014   Varicose veins of lower extremities with ulcer, unspecified laterality ([place]) 07/23/2012   Hypothyroid 06/21/2012   Edema of lower extremity 01/17/2012   Anxiety 11/05/2011   Aortic stenosis 03/22/2011    Past Surgical History:  Procedure Laterality Date   [place] Right    CATARACT EXTRACTION     ENDOSCOPIC VEIN LASER TREATMENT     ENDOVENOUS ABLATION SAPHENOUS VEIN W/ LASER  08-29-2012   left greater saphenous vein   Sherren Mocha Early MD   ENDOVENOUS ABLATION SAPHENOUS VEIN W/ LASER  09-19-2012   right greater saphenous vein by Curt Jews MD   EYE SURGERY  2005   Bilateral cataract   FOOT SURGERY  2003   bilateral Hammer Toe   stab phlebectomy Right 01-02-2013   10-15 incisions right thigh and calf by Curt Jews MD     OB History   No obstetric history on file.     Family History  Problem Relation Age of Onset   Heart disease Mother    Peripheral vascular disease Mother    Heart disease Father    Peripheral vascular disease Father        Right leg amputation   COPD Father    Heart disease Brother 49       Heart Disease before age 74   Heart attack Brother    Peripheral vascular disease Other    Hypertension Sister     Social History   Tobacco Use   Smoking status: Former    Types: Cigarettes    Quit date: 10/31/1983    Years since quitting: 37.8   Smokeless tobacco: Never  Vaping Use   Vaping Use: Never used  Substance Use Topics   Alcohol use: No   Drug use: No    Home Medications Prior to Admission medications   Medication Sig Start Date End Date Taking? Authorizing Provider  acetaminophen (TYLENOL) 500 MG tablet Take 1,000 mg by mouth every 8 (eight) hours as needed for moderate pain (joint pain).     [provider]  ALPRAZolam (XANAX) 0.25 MG tablet TAKE ONE TABLET BY MOUTH EVERY NIGHT AT BEDTIME AS NEEDED FOR ANXIETY Patient taking differently: Take 0.25 mg by mouth at bedtime. 06/21/21   Nche, Charlene Brooke, NP  aspirin 81 MG chewable tablet Chew 1 tablet (81 mg total) by mouth daily. 10/26/18   Hennie Duos, MD  gabapentin (NEURONTIN) 100 MG capsule Take 1 capsule (100 mg total) by mouth daily. 03/13/21   Nche, Charlene Brooke, NP  HYDROcodone-acetaminophen (NORCO/VICODIN) 5-325 MG  tablet Take 1 tablet by mouth every 6 (six) hours as needed for moderate pain.  07/30/20   [provider]  metoprolol succinate (TOPROL-XL) 25 MG 24 hr tablet Take 3 tablets (75 mg total) by mouth daily. 03/13/21   Nche, Charlene Brooke, NP    Allergies    Augmentin [amoxicillin-pot clavulanate], Infliximab, Aspirin, Carvedilol, Codeine, Contrast media [iodinated diagnostic agents], Other, and Penicillins  Review of Systems   Review of Systems  Constitutional:  Positive for activity change.  Gastrointestinal:  Positive for abdominal pain.  All other systems reviewed and are negative.  Physical Exam Updated Vital Signs BP (!) 175/94   Pulse 83   Temp 98.8 F (37.1 C) (Oral)   Resp Marland Kitchen)  21   Ht 5' (1.524 m)   Wt 52.2 kg   SpO2 98%   BMI 22.46 kg/m   Physical Exam Vitals and nursing note reviewed.  Constitutional:      Appearance: She is well-developed.  HENT:     Head: Atraumatic.  Cardiovascular:     Rate and Rhythm: Normal rate.  Pulmonary:     Effort: Pulmonary effort is normal.  Abdominal:     Tenderness: There is abdominal tenderness.     Comments: Lower quadrant tenderness with some guarding.  No rebound tenderness  Musculoskeletal:     Cervical back: Normal range of motion and neck supple.  Skin:    General: Skin is warm and dry.  Neurological:     Mental Status: She is alert and oriented to person, place, and time.    ED Results / Procedures / Treatments   Labs (all labs ordered are listed, but only abnormal results are displayed) Labs Reviewed  COMPREHENSIVE METABOLIC PANEL - Abnormal; Notable for the following components:      Result Value   Potassium 2.9 (*)    Chloride 97 (*)    Glucose, Bld 119 (*)    Calcium 8.4 (*)    Albumin 3.0 (*)    All other components within normal limits  CBC WITH DIFFERENTIAL/PLATELET - Abnormal; Notable for the following components:   WBC 13.4 (*)    Hemoglobin 10.5 (*)    HCT 34.3 (*)    RDW 17.5 (*)     Neutro Abs 10.8 (*)    All other components within normal limits  URINALYSIS, ROUTINE W REFLEX MICROSCOPIC - Abnormal; Notable for the following components:   APPearance CLOUDY (*)    Hgb urine dipstick SMALL (*)    Protein, ur 30 (*)    Leukocytes,Ua LARGE (*)    WBC, UA >50 (*)    Bacteria, UA MANY (*)    All other components within normal limits  URINE CULTURE  LACTIC ACID, PLASMA  LACTIC ACID, PLASMA    EKG None  Radiology DG Chest Port 1 View  Result Date: 08/30/2021 CLINICAL DATA:  Pt arrived via EMS from home. Pt has hx of UTI and sepsis. Pt has been non compliant with amoxicillin and has had UTI symptoms for the past 2 weeks. Pt is AANDOx4. EXAM: PORTABLE CHEST 1 VIEW COMPARISON:  08/14/2021 FINDINGS: Cardiac silhouette is mildly enlarged. Moderate hiatal hernia, unchanged no mediastinal or hilar masses. Hazy opacity at the right lung base, increased from the prior study, consistent with atelectasis. Remainder of the lungs is clear. No pneumothorax. Skeletal structures are grossly intact. IMPRESSION: No acute cardiopulmonary disease. Electronically Signed   By: Lajean Manes M.D.   On: 08/30/2021 21:57    Procedures Procedures   Medications Ordered in ED Medications  magnesium sulfate IVPB 2 g 50 mL (2 g Intravenous New Bag/Given 08/31/21 0002)  potassium chloride 10 mEq in 100 mL IVPB (10 mEq Intravenous New Bag/Given 08/31/21 0001)  potassium chloride SA (KLOR-CON) CR tablet 40 mEq (40 mEq Oral Given 08/30/21 2309)  sodium chloride 0.9 % bolus 1,000 mL (1,000 mLs Intravenous New Bag/Given 08/30/21 2352)    ED Course  I have reviewed the triage vital signs and the nursing notes.  Pertinent labs & imaging results that were available during my care of the patient were reviewed by me and considered in my medical decision making (see chart for details).  Clinical Course as of 08/31/21 0016  Wed Aug 31, 2021  0015 Potassium(!): 2.9 Oral and IV potassium ordered.  We have  also given her IV magnesium as in the past she had hypomagnesemia. [AN]    Clinical Course User Index [AN] Varney Biles, MD   MDM Rules/Calculators/A&P                           87 year old female comes in with chief complaint of urinary frequency, persistent abdominal pain, lack of appetite and weakness.  She was recently admitted to the hospital for diverticulitis.  She was also found to have Colovesicular fistula.  It appears that patient was discharged with Augmentin and she has not taken at least 5 days worth of it per patient's son.  Besides slightly elevated blood pressure, patient does not have any hemodynamic instability.  No fevers.  Her exam is reassuring.  Given her age and her report of not getting better, and her not take any antibiotics, we will get a CT abdomen and pelvis to ensure that there is no complication from her diverticulitis that needs admission.  If the CT scan is reassuring, but shows diverticulitis and she can get Zosyn and can continue to take Augmentin that has been prescribed at home.  If her CT scan does not show diverticulitis, then we will give her fosfomycin before she is discharged.  Patient's care has been signed out to the incoming team at midnight.  Patient's son has been made aware of the plan.  He will come to the ER first thing in the morning and we will be happy to take her home.  Final Clinical Impression(s) / ED Diagnoses Final diagnoses:  None    Rx / DC Orders ED Discharge Orders          [place]        08/30/21 2330    Face-to-face encounter (required for Medicare/Medicaid patients)       Comments: I Daeron Carreno certify that this patient is under my care and that I, or a nurse practitioner or physician's assistant working with me, had a face-to-face encounter that meets the physician face-to-face encounter requirements with this patient on 08/30/2021. The encounter with the patient was in whole, or in part for the  following medical condition(s) which is the primary reason for home health care (List medical condition):  Weakness, decline in care   08/30/21 [place], Ila, MD 08/31/21 0016

## 2021-08-31 NOTE — ED Provider Notes (Signed)
CT imaging does not reveal any acute findings aside from chronic diverticulitis.  Patient refuses to take oral antibiotics here.  We will give a one-time dose of IV antibiotics.  Otherwise she is appropriate for discharge home She is in no acute distress at this time   Ripley Fraise, MD 08/31/21 0134

## 2021-09-01 NOTE — Telephone Encounter (Signed)
Pt is okay with appointment scheduled 09/12/21

## 2021-09-09 DIAGNOSIS — N3020 Other chronic cystitis without hematuria: Secondary | ICD-10-CM | POA: Diagnosis not present

## 2021-09-09 DIAGNOSIS — N1330 Unspecified hydronephrosis: Secondary | ICD-10-CM | POA: Diagnosis not present

## 2021-09-09 DIAGNOSIS — R8271 Bacteriuria: Secondary | ICD-10-CM | POA: Diagnosis not present

## 2021-09-09 DIAGNOSIS — N321 Vesicointestinal fistula: Secondary | ICD-10-CM | POA: Diagnosis not present

## 2021-09-12 ENCOUNTER — Ambulatory Visit (INDEPENDENT_AMBULATORY_CARE_PROVIDER_SITE_OTHER): Payer: Medicare Other | Admitting: Nurse Practitioner

## 2021-09-12 ENCOUNTER — Encounter: Payer: Self-pay | Admitting: Nurse Practitioner

## 2021-09-12 ENCOUNTER — Other Ambulatory Visit: Payer: Self-pay

## 2021-09-12 VITALS — BP 126/74 | HR 82 | Temp 97.7°F | Wt 117.2 lb

## 2021-09-12 DIAGNOSIS — E039 Hypothyroidism, unspecified: Secondary | ICD-10-CM

## 2021-09-12 DIAGNOSIS — R739 Hyperglycemia, unspecified: Secondary | ICD-10-CM | POA: Diagnosis not present

## 2021-09-12 DIAGNOSIS — I5042 Chronic combined systolic (congestive) and diastolic (congestive) heart failure: Secondary | ICD-10-CM

## 2021-09-12 DIAGNOSIS — N1831 Chronic kidney disease, stage 3a: Secondary | ICD-10-CM

## 2021-09-12 DIAGNOSIS — N390 Urinary tract infection, site not specified: Secondary | ICD-10-CM

## 2021-09-12 DIAGNOSIS — E876 Hypokalemia: Secondary | ICD-10-CM

## 2021-09-12 DIAGNOSIS — G629 Polyneuropathy, unspecified: Secondary | ICD-10-CM

## 2021-09-12 DIAGNOSIS — F411 Generalized anxiety disorder: Secondary | ICD-10-CM | POA: Diagnosis not present

## 2021-09-12 DIAGNOSIS — I4819 Other persistent atrial fibrillation: Secondary | ICD-10-CM | POA: Diagnosis not present

## 2021-09-12 DIAGNOSIS — I10 Essential (primary) hypertension: Secondary | ICD-10-CM

## 2021-09-12 DIAGNOSIS — D473 Essential (hemorrhagic) thrombocythemia: Secondary | ICD-10-CM | POA: Insufficient documentation

## 2021-09-12 DIAGNOSIS — J9601 Acute respiratory failure with hypoxia: Secondary | ICD-10-CM | POA: Insufficient documentation

## 2021-09-12 MED ORDER — GABAPENTIN 100 MG PO CAPS: 100.0000 mg | ORAL_CAPSULE | Freq: Every day | ORAL | 3 refills | Status: AC

## 2021-09-12 MED ORDER — FUROSEMIDE 20 MG PO TABS
20.0000 mg | ORAL_TABLET | Freq: Every day | ORAL | 1 refills | Status: DC
Start: 2021-09-12 — End: 2022-05-11

## 2021-09-12 MED ORDER — METOPROLOL SUCCINATE ER 25 MG PO TB24: 75.0000 mg | ORAL_TABLET | Freq: Every day | ORAL | 3 refills | Status: DC

## 2021-09-12 MED ORDER — POTASSIUM CHLORIDE CRYS ER 20 MEQ PO TBCR
20.0000 meq | EXTENDED_RELEASE_TABLET | Freq: Every day | ORAL | 0 refills | Status: DC
Start: 2021-09-12 — End: 2022-04-03

## 2021-09-12 MED ORDER — ALPRAZOLAM 0.25 MG PO TABS: 0.2500 mg | ORAL_TABLET | Freq: Every evening | ORAL | 2 refills | Status: DC | PRN

## 2021-09-12 NOTE — Assessment & Plan Note (Signed)
Repeat TSh and T4 Unsure when levothyroxine was discontinued

## 2021-09-12 NOTE — Assessment & Plan Note (Signed)
BP at goal with metoprolol 75mg  BP Readings from Last 3 Encounters:  09/12/21 126/74  08/31/21 (!) 147/70  08/18/21 (!) 121/97   Continue med dose Refill sent

## 2021-09-12 NOTE — Patient Instructions (Addendum)
Increase furosemide 20mg  in AM And PM x 3days, then switch to 20mg  daily. Add Kdur 47mEq daily x 3days  Go to lab for blood draw.  Maintain low sodium diet. Elevate legs as much as possible. Let me know if leg edema does not improve in 1week.  Let me know if you change your mind about bedside commode

## 2021-09-12 NOTE — Progress Notes (Signed)
Subjective:  Patient ID: [name], female    DOB: 11-14-1926  Age: 87 y.o. MRN: [id number]  CC: Follow-up (6 month f/u on HTN and hypothyroidism. )  HPI Accompanied by son-Mike.  Hypothyroid Repeat TSh and T4 Unsure when levothyroxine was discontinued  Chronic combined systolic and diastolic congestive heart failure (HCC) Bilateral LE edema. No cough or SOB or PND. Worse after ED visit and IV fluid were administered. Current use of furosemide 20mg  daily.  Check BMP Increase furosemide 20mg  in AM And PM x 3days, then switch to 20mg  daily. Add Kdur 68mEq daily x 3days Maintain low sodium diet. Elevate legs as much as possible. Let me know if leg edema does not improve in 1week.  Benign essential hypertension BP at goal with metoprolol 75mg  BP Readings from Last 3 Encounters:  09/12/21 126/74  08/31/21 (!) 147/70  08/18/21 (!) 121/97   Continue med dose Refill sent  Persistent atrial fibrillation (HCC) Rate controlled with metoprolol. Hx of GI bleed: no anticoagulant.  Wt Readings from Last 3 Encounters:  09/12/21 117 lb 3.2 oz (53.2 kg)  08/30/21 115 lb (52.2 kg)  08/14/21 110 lb (49.9 kg)    Reviewed past Medical, Social and Family history today.  Outpatient Medications Prior to Visit  Medication Sig Dispense Refill   acetaminophen (TYLENOL) 500 MG tablet Take 1,000 mg by mouth every 8 (eight) hours as needed for moderate pain (joint pain).      aspirin 81 MG chewable tablet Chew 1 tablet (81 mg total) by mouth daily. 30 tablet 0   HYDROcodone-acetaminophen (NORCO/VICODIN) 5-325 MG tablet Take 1 tablet by mouth every 6 (six) hours as needed for moderate pain.      ondansetron (ZOFRAN-ODT) 4 MG disintegrating tablet Take 4 mg by mouth every 8 (eight) hours as needed.     ALPRAZolam (XANAX) 0.25 MG tablet TAKE ONE TABLET BY MOUTH EVERY NIGHT AT BEDTIME AS NEEDED FOR ANXIETY (Patient taking differently: Take 0.25 mg by mouth at bedtime.) 30 tablet 2    gabapentin (NEURONTIN) 100 MG capsule Take 1 capsule (100 mg total) by mouth daily. 90 capsule 1   metoprolol succinate (TOPROL-XL) 25 MG 24 hr tablet Take 3 tablets (75 mg total) by mouth daily. 90 tablet 3   No facility-administered medications prior to visit.    ROS See HPI  Objective:  BP 126/74 (BP Location: Left Arm, Patient Position: Sitting, Cuff Size: Normal)   Pulse 82   Temp 97.7 F (36.5 C) (Temporal)   Wt 117 lb 3.2 oz (53.2 kg)   SpO2 98%   BMI 22.89 kg/m   Physical Exam Cardiovascular:     Rate and Rhythm: Normal rate. Rhythm irregular.     Pulses: Normal pulses.     Heart sounds: Normal heart sounds.  Pulmonary:     Effort: Pulmonary effort is normal.     Breath sounds: Normal breath sounds.  Abdominal:     General: Bowel sounds are normal.     Palpations: Abdomen is soft.  Musculoskeletal:     Right lower leg: Edema present.     Left lower leg: Edema present.  Skin:    General: Skin is warm and dry.     Findings: No erythema.  Neurological:     Mental Status: She is alert and oriented to person, place, and time.  Psychiatric:        Mood and Affect: Mood normal.        Behavior: Behavior normal.  Thought Content: Thought content normal.   Assessment & Plan:  This visit occurred during the SARS-CoV-2 public health emergency.  Safety protocols were in place, including screening questions prior to the visit, additional usage of staff PPE, and extensive cleaning of exam room while observing appropriate contact time as indicated for disinfecting solutions.   [name] was seen today for follow-up.  Diagnoses and all orders for this visit:  Hypothyroidism, unspecified type -     TSH -     T4, free  Benign essential hypertension -     furosemide (LASIX) 20 MG tablet; Take 1 tablet (20 mg total) by mouth daily. -     metoprolol succinate (TOPROL-XL) 25 MG 24 hr tablet; Take 3 tablets (75 mg total) by mouth daily.  Stage 3a chronic kidney disease  (HCC) -     Basic metabolic panel  Complicated UTI (urinary tract infection) -     CBC with Differential/Platelet  Hyperglycemia -     Hemoglobin A1c  Hypokalemia -     potassium chloride SA (KLOR-CON) 20 MEQ tablet; Take 1 tablet (20 mEq total) by mouth daily.  Chronic combined systolic and diastolic congestive heart failure (HCC)  Polyneuropathy -     gabapentin (NEURONTIN) 100 MG capsule; Take 1 capsule (100 mg total) by mouth daily.  GAD (generalized anxiety disorder) -     ALPRAZolam (XANAX) 0.25 MG tablet; Take 1 tablet (0.25 mg total) by mouth at bedtime as needed for anxiety.  Persistent atrial fibrillation (HCC)  Declined order for bedside commode.  Problem List Items Addressed This Visit       Cardiovascular and Mediastinum   Chronic combined systolic and diastolic congestive heart failure (HCC) (Chronic)    Bilateral LE edema. No cough or SOB or PND. Worse after ED visit and IV fluid were administered. Current use of furosemide 20mg  daily.  Check BMP Increase furosemide 20mg  in AM And PM x 3days, then switch to 20mg  daily. Add Kdur 11mEq daily x 3days Maintain low sodium diet. Elevate legs as much as possible. Let me know if leg edema does not improve in 1week.      Relevant Medications   furosemide (LASIX) 20 MG tablet   metoprolol succinate (TOPROL-XL) 25 MG 24 hr tablet   Persistent atrial fibrillation (HCC) (Chronic)    Rate controlled with metoprolol. Hx of GI bleed: no anticoagulant.      Relevant Medications   furosemide (LASIX) 20 MG tablet   metoprolol succinate (TOPROL-XL) 25 MG 24 hr tablet   Benign essential hypertension    BP at goal with metoprolol 75mg  BP Readings from Last 3 Encounters:  09/12/21 126/74  08/31/21 (!) 147/70  08/18/21 (!) 121/97   Continue med dose Refill sent      Relevant Medications   furosemide (LASIX) 20 MG tablet   metoprolol succinate (TOPROL-XL) 25 MG 24 hr tablet     Endocrine   Hypothyroid -  Primary    Repeat TSh and T4 Unsure when levothyroxine was discontinued      Relevant Medications   metoprolol succinate (TOPROL-XL) 25 MG 24 hr tablet   Other Relevant Orders   TSH   T4, free     Nervous and Auditory   Polyneuropathy   Relevant Medications   gabapentin (NEURONTIN) 100 MG capsule   ALPRAZolam (XANAX) 0.25 MG tablet     Genitourinary   Complicated UTI (urinary tract infection)   Relevant Orders   CBC with Differential/Platelet   Other Visit Diagnoses  Stage 3a chronic kidney disease ([place])       Relevant Orders   Basic metabolic panel   Hyperglycemia       Relevant Orders   Hemoglobin A1c   Hypokalemia       Relevant Medications   potassium chloride SA (KLOR-CON) 20 MEQ tablet   GAD (generalized anxiety disorder)       Relevant Medications   ALPRAZolam (XANAX) 0.25 MG tablet       Follow-up: Return in about 3 months (around 12/13/2021) for HTN and LE edema and hypothyrodism.  Wilfred Lacy, NP

## 2021-09-12 NOTE — Assessment & Plan Note (Signed)
Rate controlled with metoprolol. Hx of GI bleed: no anticoagulant.

## 2021-09-12 NOTE — Assessment & Plan Note (Signed)
Bilateral LE edema. No cough or SOB or PND. Worse after ED visit and IV fluid were administered. Current use of furosemide 20mg  daily.  Check BMP Increase furosemide 20mg  in AM And PM x 3days, then switch to 20mg  daily. Add Kdur 13mEq daily x 3days Maintain low sodium diet. Elevate legs as much as possible. Let me know if leg edema does not improve in 1week.

## 2021-09-13 LAB — CBC WITH DIFFERENTIAL/PLATELET
Basophils Absolute: 0.1 10*3/uL (ref 0.0–0.1)
Basophils Relative: 1.4 % (ref 0.0–3.0)
Eosinophils Absolute: 0.2 10*3/uL (ref 0.0–0.7)
Eosinophils Relative: 2.3 % (ref 0.0–5.0)
HCT: 33.4 % — ABNORMAL LOW (ref 36.0–46.0)
Hemoglobin: 10.5 g/dL — ABNORMAL LOW (ref 12.0–15.0)
Lymphocytes Relative: 19 % (ref 12.0–46.0)
Lymphs Abs: 1.6 10*3/uL (ref 0.7–4.0)
MCHC: 31.4 g/dL (ref 30.0–36.0)
MCV: 84.3 fl (ref 78.0–100.0)
Monocytes Absolute: 0.3 10*3/uL (ref 0.1–1.0)
Monocytes Relative: 3.8 % (ref 3.0–12.0)
Neutro Abs: 6.2 10*3/uL (ref 1.4–7.7)
Neutrophils Relative %: 73.5 % (ref 43.0–77.0)
Platelets: 493 10*3/uL — ABNORMAL HIGH (ref 150.0–400.0)
RBC: 3.96 Mil/uL (ref 3.87–5.11)
RDW: 18 % — ABNORMAL HIGH (ref 11.5–15.5)
WBC: 8.4 10*3/uL (ref 4.0–10.5)

## 2021-09-13 LAB — BASIC METABOLIC PANEL
BUN: 12 mg/dL (ref 6–23)
CO2: 30 mEq/L (ref 19–32)
Calcium: 8.3 mg/dL — ABNORMAL LOW (ref 8.4–10.5)
Chloride: 100 mEq/L (ref 96–112)
Creatinine, Ser: 0.93 mg/dL (ref 0.40–1.20)
GFR: 53.47 mL/min — ABNORMAL LOW (ref >60.00)
Glucose, Bld: 100 mg/dL — ABNORMAL HIGH (ref 70–99)
Potassium: 3.8 mEq/L (ref 3.5–5.1)
Sodium: 138 mEq/L (ref 135–145)

## 2021-09-13 LAB — T4, FREE: Free T4: 0.79 ng/dL (ref 0.60–1.60)

## 2021-09-13 LAB — HEMOGLOBIN A1C: Hgb A1c MFr Bld: 6.1 % (ref 4.6–6.5)

## 2021-09-13 LAB — TSH: TSH: 4.17 u[IU]/mL (ref 0.35–5.50)

## 2021-09-15 ENCOUNTER — Telehealth: Payer: Self-pay | Admitting: Nurse Practitioner

## 2021-09-15 DIAGNOSIS — E039 Hypothyroidism, unspecified: Secondary | ICD-10-CM

## 2021-09-15 MED ORDER — LEVOTHYROXINE SODIUM 50 MCG PO TABS: 50.0000 ug | ORAL_TABLET | Freq: Every day | ORAL | 3 refills | Status: AC

## 2021-09-15 NOTE — Telephone Encounter (Signed)
-----   Message from Arcelia Jew, [place] sent at 09/14/2021  4:19 PM EST ----- Yes she is taking it

## 2021-09-16 ENCOUNTER — Telehealth: Payer: Self-pay

## 2021-09-16 NOTE — Telephone Encounter (Signed)
Pts son, Aryiah Monterosso called. Pt threw away her bottle of pills containing Alprazolam 0.25mg . He is asking if [name] can refill just enough to get her thru the end of the month  Pharmacy is [facility].

## 2021-09-19 NOTE — Telephone Encounter (Signed)
Son confirmed he has picked up medication from pharmacy.

## 2021-10-18 DIAGNOSIS — N1330 Unspecified hydronephrosis: Secondary | ICD-10-CM | POA: Diagnosis not present

## 2021-10-18 DIAGNOSIS — N321 Vesicointestinal fistula: Secondary | ICD-10-CM | POA: Diagnosis not present

## 2021-11-11 ENCOUNTER — Telehealth: Payer: Self-pay | Admitting: Nurse Practitioner

## 2021-11-11 NOTE — Telephone Encounter (Signed)
Left message for patient's son, [name],  to call back and schedule Medicare Annual Wellness Visit (AWV) in office.   If not able to come in office, please offer to do virtually or by telephone.  Left office number and my jabber [phone].  Due for AWVI  Please schedule at anytime with Nurse Health Advisor.

## 2021-11-30 ENCOUNTER — Other Ambulatory Visit: Payer: Self-pay

## 2021-11-30 ENCOUNTER — Ambulatory Visit (INDEPENDENT_AMBULATORY_CARE_PROVIDER_SITE_OTHER): Payer: Medicare Other

## 2021-11-30 DIAGNOSIS — Z0000 Encounter for general adult medical examination without abnormal findings: Secondary | ICD-10-CM | POA: Diagnosis not present

## 2021-11-30 NOTE — Progress Notes (Addendum)
Virtual Visit via Telephone Note  I connected with  [name] on 12/02/21 at  2:15 PM EST by telephone and verified that I am speaking with the correct person using two identifiers.  Medicare Annual Wellness visit completed telephonically due to Covid-19 pandemic.   Persons participating in this call: This Health Coach and this patient and son [name]   Location: Patient: Home Provider: Office   I discussed the limitations, risks, security and privacy concerns of performing an evaluation and management service by telephone and the availability of in person appointments. The patient expressed understanding and agreed to proceed.  Unable to perform video visit due to video visit attempted and failed and/or patient does not have video capability.   Some vital signs may be absent or patient reported.   Willette Brace, LPN   Subjective:   [name] is a 88 y.o. female who presents for an Initial Medicare Annual Wellness Visit.  Review of Systems     Cardiac Risk Factors include: advanced age (>92men, >14 women);hypertension;dyslipidemia     Objective:    There were no vitals filed for this visit. There is no height or weight on file to calculate BMI.  Advanced Directives 11/30/2021 08/30/2021 08/15/2021 08/14/2021 10/28/2020 08/13/2020 08/12/2020  Does Patient Have a Medical Advance Directive? Yes No No No No Yes Yes  Type of Advance Directive [place]  Does patient want to make changes to medical advance directive? - - - - - No - Patient declined -  Copy of [place] in Chart? No - copy requested - - - - No - copy requested -  Would patient like information on creating a medical advance directive? - No - Patient declined No - Patient declined - - - -    Current Medications (verified) Outpatient Encounter Medications as of 11/30/2021  Medication Sig    acetaminophen (TYLENOL) 500 MG tablet Take 1,000 mg by mouth every 8 (eight) hours as needed for moderate pain (joint pain).    ALPRAZolam (XANAX) 0.25 MG tablet Take 1 tablet (0.25 mg total) by mouth at bedtime as needed for anxiety.   aspirin 81 MG chewable tablet Chew 1 tablet (81 mg total) by mouth daily.   fosfomycin (MONUROL) 3 g PACK Take by mouth.   furosemide (LASIX) 20 MG tablet Take 1 tablet (20 mg total) by mouth daily.   gabapentin (NEURONTIN) 100 MG capsule Take 1 capsule (100 mg total) by mouth daily.   HYDROcodone-acetaminophen (NORCO/VICODIN) 5-325 MG tablet Take 1 tablet by mouth every 6 (six) hours as needed for moderate pain.    levothyroxine (SYNTHROID) 50 MCG tablet Take 1 tablet (50 mcg total) by mouth daily before breakfast.   metoprolol succinate (TOPROL-XL) 25 MG 24 hr tablet Take 3 tablets (75 mg total) by mouth daily.   ondansetron (ZOFRAN-ODT) 4 MG disintegrating tablet Take 4 mg by mouth every 8 (eight) hours as needed.   potassium chloride SA (KLOR-CON) 20 MEQ tablet Take 1 tablet (20 mEq total) by mouth daily.   No facility-administered encounter medications on file as of 11/30/2021.    Allergies (verified) Augmentin [amoxicillin-pot clavulanate], Infliximab, Aspirin, Carvedilol, Codeine, Contrast media [iodinated contrast media], Other, and Penicillins   History: Past Medical History:  Diagnosis Date   A-fib ([place])    Acute CHF (congestive heart failure) ([place]) 09/02/2019   Aortic stenosis, moderate    last echo  in 2010; AVA 1.1; mild AS per echo in June 2013   Colovesical fistula    Diverticulitis    Edema of both legs    s/p laser treatment per Dr. Donnetta Hutching   GAD (generalized anxiety disorder) 05/20/2020   Hiatal hernia    History of chicken pox    HLD (hyperlipidemia)    HTN (hypertension)    Hypertension 04/19/2012   Hypothyroidism    IBS (irritable bowel syndrome)    Iron deficiency 09/19/2017   Iron deficiency anemia    Iron deficiency anemia  02/22/2020   Localized edema 06/21/2012   Overview:  Vascular surgeon - Dr Donnetta Hutching   Neuropathic pain 09/06/2015   Rheumatoid arthritis ([place])    Past Surgical History:  Procedure Laterality Date   APPENDECTOMY  1955   CARPAL TUNNEL RELEASE Right    CATARACT EXTRACTION     ENDOSCOPIC VEIN LASER TREATMENT     ENDOVENOUS ABLATION SAPHENOUS VEIN W/ LASER  08-29-2012   left greater saphenous vein   Curt Jews MD   ENDOVENOUS ABLATION SAPHENOUS VEIN W/ LASER  09-19-2012   right greater saphenous vein by Curt Jews MD   EYE SURGERY  2005   Bilateral cataract   FOOT SURGERY  2003   bilateral Hammer Toe   stab phlebectomy Right 01-02-2013   10-15 incisions right thigh and calf by Curt Jews MD   Family History  Problem Relation Age of Onset   Heart disease Mother    Peripheral vascular disease Mother    Heart disease Father    Peripheral vascular disease Father        Right leg amputation   COPD Father    Heart disease Brother 69       Heart Disease before age 7   Heart attack Brother    Peripheral vascular disease Other    Hypertension Sister    Social History   Socioeconomic History   Marital status: Widowed    Spouse name: Not on file   Number of children: 2   Years of education: Not on file   Highest education level: Not on file  Occupational History   Occupation: retired  Tobacco Use   Smoking status: Former    Types: Cigarettes    Quit date: 10/31/1983    Years since quitting: 38.1   Smokeless tobacco: Never  Vaping Use   Vaping Use: Never used  Substance and Sexual Activity   Alcohol use: No   Drug use: No   Sexual activity: Never  Other Topics Concern   Not on file  Social History Narrative   Son lives with patient.   Social Determinants of Health   Financial Resource Strain: Low Risk    Difficulty of Paying Living Expenses: Not hard at all  Food Insecurity: No Food Insecurity   Worried About Charity fundraiser in the Last Year: Never true   [place] in the Last Year: Never true  Transportation Needs: No Transportation Needs   Lack of Transportation (Medical): No   Lack of Transportation (Non-Medical): No  Physical Activity: Inactive   Days of Exercise per Week: 0 days   Minutes of Exercise per Session: 0 min  Stress: No Stress Concern Present   Feeling of Stress : Not at all  Social Connections: Moderately Isolated   Frequency of Communication with Friends and Family: More than three times a week   Frequency of Social Gatherings with Friends and Family: Once a week  Attends Religious Services: 1 to 4 times per year   Active Member of Clubs or Organizations: No   Attends Archivist Meetings: Never   Marital Status: Widowed    Tobacco Counseling Counseling given: Not Answered   Clinical Intake:  Pre-visit preparation completed: Yes  Pain : No/denies pain     BMI - recorded: 22.8 Nutritional Status: BMI of 19-24  Normal Nutritional Risks: None Diabetes: No  How often do you need to have someone help you when you read instructions, pamphlets, or other written materials from your doctor or pharmacy?: 1 - Never  Diabetic?No  Interpreter Needed?: No  Information entered by :: Charlott Rakes, LPN   Activities of Daily Living In your present state of health, do you have any difficulty performing the following activities: 11/30/2021 08/15/2021  Hearing? Tempie Donning  Comment [place]? N N  Difficulty concentrating or making decisions? N N  Walking or climbing stairs? N N  Dressing or bathing? N N  Doing errands, shopping? N Y  Conservation officer, nature and eating ? N -  Comment son assist -  Using the Toilet? N -  In the past six months, have you accidently leaked urine? N -  Do you have problems with loss of bowel control? N -  Managing your Medications? N -  Managing your Finances? N -  Housekeeping or managing your Housekeeping? N -  Some recent data might be hidden    Patient Care Team: Nche, Charlene Brooke, NP as PCP - General (Internal Medicine) O'Neal, Cassie Freer, MD as PCP - Cardiology (Internal Medicine) Gavin Pound, MD as Consulting Physician (Rheumatology) Tommy Medal, Lavell Islam, MD as Consulting Physician (Infectious Diseases) Bo Merino, MD as Consulting Physician (Rheumatology)  Indicate any recent Medical Services you may have received from other than [facility] providers in the past year (date may be approximate).     Assessment:   This is a routine wellness examination for [place].  Hearing/Vision screen Hearing Screening - Comments:: Pt HOH  Vision Screening - Comments:: Pt follows up with [facility] for annual eye exams   Dietary issues and exercise activities discussed: Current Exercise Habits: The patient does not participate in regular exercise at present (walk patio as weather permits)   Goals Addressed             This Visit's Progress    Patient Stated       More walking as weather permits        Depression Screen PHQ 2/9 Scores 11/30/2021 03/10/2021 09/03/2020 04/21/2020 03/15/2020 11/26/2018 01/07/2018  PHQ - 2 Score 0 0 0 6 0 0 0  PHQ- 9 Score - - - 19 - - -    Fall Risk Fall Risk  11/30/2021 09/12/2021 09/03/2020 04/21/2020 03/15/2020  Falls in the past year? 0 0 0 0 0  Number falls in past yr: 0 0 - 0 -  Injury with Fall? 0 0 - 0 -  Risk for fall due to : Impaired vision No Fall Risks - - -  Follow up Falls prevention discussed Falls evaluation completed - - -    FALL RISK PREVENTION PERTAINING TO THE HOME:  Any stairs in or around the home? Yes  If so, are there any without handrails? No  Home free of loose throw rugs in walkways, pet beds, electrical cords, etc? Yes  Adequate lighting in your home to reduce risk of falls? Yes   ASSISTIVE DEVICES UTILIZED TO PREVENT FALLS:  Life alert? No  Use of a cane, walker or w/c? Yes  Grab bars in the bathroom? No  Shower chair or bench in shower? No  Elevated toilet seat or a handicapped  toilet? No   TIMED UP AND GO:  Was the test performed? No .   Cognitive Function:     6CIT Screen 11/30/2021  What Year? 0 points  What month? 0 points  What time? 0 points  Count back from 20 0 points  Months in reverse 2 points  Repeat phrase 10 points  Total Score 12    Immunizations Immunization History  Administered Date(s) Administered   Influenza, High Dose Seasonal PF 09/17/2017, 08/09/2018   Influenza,inj,Quad PF,6+ Mos 09/06/2015   Influenza,trivalent, recombinat, inj, PF 08/01/2011, 06/21/2012   Influenza-Unspecified 08/01/2011, 06/21/2012, 09/06/2015, 07/31/2019   PPD Test 12/04/2011   Pneumococcal Conjugate-13 08/09/2018   Pneumococcal Polysaccharide-23 07/04/2011   Tdap 07/04/2011, 08/17/2014    TDAP status: Up to date  Flu Vaccine status: Declined, Education has been provided regarding the importance of this vaccine but patient still declined. Advised may receive this vaccine at local pharmacy or Health Dept. Aware to provide a copy of the vaccination record if obtained from local pharmacy or Health Dept. Verbalized acceptance and understanding.  Pneumococcal vaccine status: Up to date  Covid-19 vaccine status: Declined, Education has been provided regarding the importance of this vaccine but patient still declined. Advised may receive this vaccine at local pharmacy or Health Dept.or vaccine clinic. Aware to provide a copy of the vaccination record if obtained from local pharmacy or Health Dept. Verbalized acceptance and understanding.  Qualifies for Shingles Vaccine? Yes   Zostavax completed No   Shingrix Completed?: No.    Education has been provided regarding the importance of this vaccine. Patient has been advised to call insurance company to determine out of pocket expense if they have not yet received this vaccine. Advised may also receive vaccine at local pharmacy or Health Dept. Verbalized acceptance and understanding.  Screening Tests Health  Maintenance  Topic Date Due   Zoster Vaccines- Shingrix (1 of 2) 12/13/2021 (Originally 06/27/1946)   COVID-19 Vaccine (1) 12/16/2021 (Originally 12/27/1927)   INFLUENZA VACCINE  01/27/2022 (Originally 05/30/2021)   DEXA SCAN  03/10/2022 (Originally [birth year]27[birth year]1994)   TETANUS/TDAP  08/17/2024   Pneumonia Vaccine 59+ Years old  Completed   HPV VACCINES  Aged Out    Health Maintenance  There are no preventive care reminders to display for this patient.   Colorectal cancer screening: No longer required.   Mammogram status: No longer required due to age.      Additional Screening:  Vision Screening: Recommended annual ophthalmology exams for early detection of glaucoma and other disorders of the eye. Is the patient up to date with their annual eye exam?  Yes  Who is the provider or what is the name of the office in which the patient attends annual eye exams? [facility]  If pt is not established with a provider, would they like to be referred to a provider to establish care? No .   Dental Screening: Recommended annual dental exams for proper oral hygiene  Community Resource Referral / Chronic Care Management: CRR required this visit?  No   CCM required this visit?  No      Plan:     I have personally reviewed and noted the following in the patients chart:   Medical and social history Use of alcohol, tobacco or illicit drugs  Current medications  and supplements including opioid prescriptions. Patient is currently taking opioid prescriptions. Information provided to patient regarding non-opioid alternatives. Patient advised to discuss non-opioid treatment plan with their provider. Functional ability and status Nutritional status Physical activity Advanced directives List of other physicians Hospitalizations, surgeries, and ER visits in previous 12 months Vitals Screenings to include cognitive, depression, and falls Referrals and appointments  In addition, I have reviewed  and discussed with patient certain preventive protocols, quality metrics, and best practice recommendations. A written personalized care plan for preventive services as well as general preventive health recommendations were provided to patient.     Willette Brace, LPN   02/04/6281   Nurse Notes: None

## 2021-11-30 NOTE — Patient Instructions (Addendum)
[name] , Thank you for taking time to come for your Medicare Wellness Visit. I appreciate your ongoing commitment to your health goals. Please review the following plan we discussed and let me know if I can assist you in the future.   Screening recommendations/referrals: Colonoscopy: No longer required  Mammogram: No longer required  Recommended yearly ophthalmology/optometry visit for glaucoma screening and checkup Recommended yearly dental visit for hygiene and checkup  Vaccinations: Influenza vaccine: Declined and discussed  Pneumococcal vaccine: done 06/30/14 Tdap vaccine: Done 08/17/14 Shingles vaccine: Shingrix,  Please contact your pharmacy for coverage information.   Covid-19: declined and discussed   Advanced directives: Please bring a copy of your health care power of attorney and living will to the office at your convenience.  Conditions/risks identified: Walk more   Next appointment: Follow up in one year for your annual wellness visit    Preventive Care 65 Years and Older, Female Preventive care refers to lifestyle choices and visits with your health care provider that can promote health and wellness. What does preventive care include? A yearly physical exam. This is also called an annual well check. Dental exams once or twice a year. Routine eye exams. Ask your health care provider how often you should have your eyes checked. Personal lifestyle choices, including: Daily care of your teeth and gums. Regular physical activity. Eating a healthy diet. Avoiding tobacco and drug use. Limiting alcohol use. Practicing safe sex. Taking low-dose aspirin every day. Taking vitamin and mineral supplements as recommended by your health care provider. What happens during an annual well check? The services and screenings done by your health care provider during your annual well check will depend on your age, overall health, lifestyle risk factors, and family history of  disease. Counseling  Your health care provider may ask you questions about your: Alcohol use. Tobacco use. Drug use. Emotional well-being. Home and relationship well-being. Sexual activity. Eating habits. History of falls. Memory and ability to understand (cognition). Work and work Statistician. Reproductive health. Screening  You may have the following tests or measurements: Height, weight, and BMI. Blood pressure. Lipid and cholesterol levels. These may be checked every 5 years, or more frequently if you are over 45 years old. Skin check. Lung cancer screening. You may have this screening every year starting at age 23 if you have a 30-pack-year history of smoking and currently smoke or have quit within the past 15 years. Fecal occult blood test (FOBT) of the stool. You may have this test every year starting at age 68. Flexible sigmoidoscopy or colonoscopy. You may have a sigmoidoscopy every 5 years or a colonoscopy every 10 years starting at age 42. Hepatitis C blood test. Hepatitis B blood test. Sexually transmitted disease (STD) testing. Diabetes screening. This is done by checking your blood sugar (glucose) after you have not eaten for a while (fasting). You may have this done every 1-3 years. Bone density scan. This is done to screen for osteoporosis. You may have this done starting at age 60. Mammogram. This may be done every 1-2 years. Talk to your health care provider about how often you should have regular mammograms. Talk with your health care provider about your test results, treatment options, and if necessary, the need for more tests. Vaccines  Your health care provider may recommend certain vaccines, such as: Influenza vaccine. This is recommended every year. Tetanus, diphtheria, and acellular pertussis (Tdap, Td) vaccine. You may need a Td booster every 10 years. Zoster vaccine. You  may need this after age 40. Pneumococcal 13-valent conjugate (PCV13) vaccine. One  dose is recommended after age 25. Pneumococcal polysaccharide (PPSV23) vaccine. One dose is recommended after age 44. Talk to your health care provider about which screenings and vaccines you need and how often you need them. This information is not intended to replace advice given to you by your health care provider. Make sure you discuss any questions you have with your health care provider. Document Released: 11/12/2015 Document Revised: 07/05/2016 Document Reviewed: 08/17/2015 Elsevier Interactive Patient Education  2017 [place] Prevention in the Home Falls can cause injuries. They can happen to people of all ages. There are many things you can do to make your home safe and to help prevent falls. What can I do on the outside of my home? Regularly fix the edges of walkways and driveways and fix any cracks. Remove anything that might make you trip as you walk through a door, such as a raised step or threshold. Trim any bushes or trees on the path to your home. Use bright outdoor lighting. Clear any walking paths of anything that might make someone trip, such as rocks or tools. Regularly check to see if handrails are loose or broken. Make sure that both sides of any steps have handrails. Any raised decks and porches should have guardrails on the edges. Have any leaves, snow, or ice cleared regularly. Use sand or salt on walking paths during winter. Clean up any spills in your garage right away. This includes oil or grease spills. What can I do in the bathroom? Use night lights. Install grab bars by the toilet and in the tub and shower. Do not use towel bars as grab bars. Use non-skid mats or decals in the tub or shower. If you need to sit down in the shower, use a plastic, non-slip stool. Keep the floor dry. Clean up any water that spills on the floor as soon as it happens. Remove soap buildup in the tub or shower regularly. Attach bath mats securely with double-sided  non-slip rug tape. Do not have throw rugs and other things on the floor that can make you trip. What can I do in the bedroom? Use night lights. Make sure that you have a light by your bed that is easy to reach. Do not use any sheets or blankets that are too big for your bed. They should not hang down onto the floor. Have a firm chair that has side arms. You can use this for support while you get dressed. Do not have throw rugs and other things on the floor that can make you trip. What can I do in the kitchen? Clean up any spills right away. Avoid walking on wet floors. Keep items that you use a lot in easy-to-reach places. If you need to reach something above you, use a strong step stool that has a grab bar. Keep electrical cords out of the way. Do not use floor polish or wax that makes floors slippery. If you must use wax, use non-skid floor wax. Do not have throw rugs and other things on the floor that can make you trip. What can I do with my stairs? Do not leave any items on the stairs. Make sure that there are handrails on both sides of the stairs and use them. Fix handrails that are broken or loose. Make sure that handrails are as long as the stairways. Check any carpeting to make sure that it is firmly  attached to the stairs. Fix any carpet that is loose or worn. Avoid having throw rugs at the top or bottom of the stairs. If you do have throw rugs, attach them to the floor with carpet tape. Make sure that you have a light switch at the top of the stairs and the bottom of the stairs. If you do not have them, ask someone to add them for you. What else can I do to help prevent falls? Wear shoes that: Do not have high heels. Have rubber bottoms. Are comfortable and fit you well. Are closed at the toe. Do not wear sandals. If you use a stepladder: Make sure that it is fully opened. Do not climb a closed stepladder. Make sure that both sides of the stepladder are locked into place. Ask  someone to hold it for you, if possible. Clearly mark and make sure that you can see: Any grab bars or handrails. First and last steps. Where the edge of each step is. Use tools that help you move around (mobility aids) if they are needed. These include: Canes. Walkers. Scooters. Crutches. Turn on the lights when you go into a dark area. Replace any light bulbs as soon as they burn out. Set up your furniture so you have a clear path. Avoid moving your furniture around. If any of your floors are uneven, fix them. If there are any pets around you, be aware of where they are. Review your medicines with your doctor. Some medicines can make you feel dizzy. This can increase your chance of falling. Ask your doctor what other things that you can do to help prevent falls. This information is not intended to replace advice given to you by your health care provider. Make sure you discuss any questions you have with your health care provider. Document Released: 08/12/2009 Document Revised: 03/23/2016 Document Reviewed: 11/20/2014 Elsevier Interactive Patient Education  2017 [employer].

## 2021-12-06 ENCOUNTER — Telehealth: Payer: Self-pay

## 2021-12-06 NOTE — Progress Notes (Signed)
Medical screening examination/treatment/procedure(s) were performed by the Wellness Coach, RN. As primary care provider I was immediately available for consulation/collaboration. I agree with above documentation. Abril Cappiello, AGNP-C 

## 2021-12-06 NOTE — Telephone Encounter (Signed)
(  3:43 pm) PC SW left a message for patient requesting a call back.

## 2021-12-13 ENCOUNTER — Other Ambulatory Visit: Payer: Self-pay | Admitting: Nurse Practitioner

## 2021-12-13 DIAGNOSIS — F411 Generalized anxiety disorder: Secondary | ICD-10-CM

## 2021-12-13 NOTE — Telephone Encounter (Signed)
Med refill  ALPRAZolam ALPRAZolam (XANAX) 0.25 MG tablet  [id number] - [place], [place] - [address] AT [address] Phone:  [phone]  Fax:  [phone]

## 2021-12-15 MED ORDER — ALPRAZOLAM 0.25 MG PO TABS: 0.2500 mg | ORAL_TABLET | Freq: Every evening | ORAL | 2 refills | Status: DC | PRN

## 2021-12-15 NOTE — Telephone Encounter (Signed)
Pts son called to follow up, he said she only has 1 left for tonight. Please advise

## 2021-12-15 NOTE — Telephone Encounter (Signed)
Per PMP database Rx last 3 fill dates  11/13/2021 10/15/2021 09/16/2021

## 2021-12-16 NOTE — Telephone Encounter (Signed)
LVM for patient to schedule f/u appointment

## 2022-01-02 ENCOUNTER — Telehealth: Payer: Self-pay | Admitting: Nurse Practitioner

## 2022-01-02 NOTE — Telephone Encounter (Signed)
Please advise message below looks like patient had this Rx fill in November 2022 taking 3 tabs daily.  ?

## 2022-01-02 NOTE — Telephone Encounter (Signed)
Pt son called and said pt is out of metoprolol succinate (TOPROL-XL) 25 MG 24 hr tablet, He said the pharmacy said it is to soon to fill through insurance even though she has been taking 3 a day as prescribed and is due. He called to pharmacy and talked to someone there and they told him that 3 a day was to many to be taking. Please advise. ?

## 2022-01-03 ENCOUNTER — Encounter: Payer: Self-pay | Admitting: Nurse Practitioner

## 2022-01-03 DIAGNOSIS — I10 Essential (primary) hypertension: Secondary | ICD-10-CM

## 2022-01-03 NOTE — Telephone Encounter (Signed)
Pt said the pharmacy is still saying she can't get it filled due to it not being time. She is frustrated. I added Tequila because she took the call first. ?

## 2022-01-04 ENCOUNTER — Encounter (HOSPITAL_COMMUNITY): Payer: Self-pay | Admitting: Emergency Medicine

## 2022-01-04 ENCOUNTER — Emergency Department (HOSPITAL_COMMUNITY): Payer: Medicare Other

## 2022-01-04 ENCOUNTER — Other Ambulatory Visit: Payer: Self-pay

## 2022-01-04 ENCOUNTER — Emergency Department (HOSPITAL_COMMUNITY)
Admission: EM | Admit: 2022-01-04 | Discharge: 2022-01-04 | Disposition: A | Discharge status: Home / Self Care | Payer: Medicare Other | Attending: Emergency Medicine | Admitting: Emergency Medicine

## 2022-01-04 DIAGNOSIS — Z7982 Long term (current) use of aspirin: Secondary | ICD-10-CM | POA: Insufficient documentation

## 2022-01-04 DIAGNOSIS — R0602 Shortness of breath: Secondary | ICD-10-CM | POA: Diagnosis not present

## 2022-01-04 DIAGNOSIS — L03115 Cellulitis of right lower limb: Secondary | ICD-10-CM | POA: Diagnosis not present

## 2022-01-04 DIAGNOSIS — E8770 Fluid overload, unspecified: Secondary | ICD-10-CM | POA: Insufficient documentation

## 2022-01-04 DIAGNOSIS — J90 Pleural effusion, not elsewhere classified: Secondary | ICD-10-CM | POA: Diagnosis not present

## 2022-01-04 DIAGNOSIS — I509 Heart failure, unspecified: Secondary | ICD-10-CM | POA: Insufficient documentation

## 2022-01-04 DIAGNOSIS — E039 Hypothyroidism, unspecified: Secondary | ICD-10-CM | POA: Diagnosis not present

## 2022-01-04 DIAGNOSIS — I4891 Unspecified atrial fibrillation: Secondary | ICD-10-CM | POA: Diagnosis not present

## 2022-01-04 DIAGNOSIS — Z79899 Other long term (current) drug therapy: Secondary | ICD-10-CM | POA: Diagnosis not present

## 2022-01-04 DIAGNOSIS — I110 Hypertensive heart disease with heart failure: Secondary | ICD-10-CM | POA: Diagnosis not present

## 2022-01-04 DIAGNOSIS — D72829 Elevated white blood cell count, unspecified: Secondary | ICD-10-CM | POA: Diagnosis not present

## 2022-01-04 DIAGNOSIS — L03116 Cellulitis of left lower limb: Secondary | ICD-10-CM | POA: Diagnosis not present

## 2022-01-04 DIAGNOSIS — L03119 Cellulitis of unspecified part of limb: Secondary | ICD-10-CM

## 2022-01-04 DIAGNOSIS — Z20822 Contact with and (suspected) exposure to covid-19: Secondary | ICD-10-CM | POA: Diagnosis not present

## 2022-01-04 DIAGNOSIS — I10 Essential (primary) hypertension: Secondary | ICD-10-CM

## 2022-01-04 DIAGNOSIS — R002 Palpitations: Secondary | ICD-10-CM | POA: Diagnosis not present

## 2022-01-04 DIAGNOSIS — R600 Localized edema: Secondary | ICD-10-CM | POA: Insufficient documentation

## 2022-01-04 DIAGNOSIS — R000 Tachycardia, unspecified: Secondary | ICD-10-CM | POA: Diagnosis not present

## 2022-01-04 LAB — CBC
HCT: 32.6 % — ABNORMAL LOW (ref 36.0–46.0)
Hemoglobin: 10.3 g/dL — ABNORMAL LOW (ref 12.0–15.0)
MCH: 27.8 pg (ref 26.0–34.0)
MCHC: 31.6 g/dL (ref 30.0–36.0)
MCV: 87.9 fL (ref 80.0–100.0)
Platelets: 283 10*3/uL (ref 150–400)
RBC: 3.71 MIL/uL — ABNORMAL LOW (ref 3.87–5.11)
RDW: 17.2 % — ABNORMAL HIGH (ref 11.5–15.5)
WBC: 12.9 10*3/uL — ABNORMAL HIGH (ref 4.0–10.5)
nRBC: 0 % (ref 0.0–0.2)

## 2022-01-04 LAB — BASIC METABOLIC PANEL
Anion gap: 12 (ref 5–15)
BUN: 16 mg/dL (ref 8–23)
CO2: 24 mmol/L (ref 22–32)
Calcium: 8.8 mg/dL — ABNORMAL LOW (ref 8.9–10.3)
Chloride: 100 mmol/L (ref 98–111)
Creatinine, Ser: 1.15 mg/dL — ABNORMAL HIGH (ref 0.44–1.00)
GFR, Estimated: 45 mL/min — ABNORMAL LOW (ref >60)
Glucose, Bld: 118 mg/dL — ABNORMAL HIGH (ref 70–99)
Potassium: 4.1 mmol/L (ref 3.5–5.1)
Sodium: 136 mmol/L (ref 135–145)

## 2022-01-04 LAB — BRAIN NATRIURETIC PEPTIDE: B Natriuretic Peptide: 521.5 pg/mL — ABNORMAL HIGH (ref 0.0–100.0)

## 2022-01-04 LAB — MAGNESIUM: Magnesium: 1.7 mg/dL (ref 1.7–2.4)

## 2022-01-04 LAB — RESP PANEL BY RT-PCR (FLU A&B, COVID) ARPGX2
Influenza A by PCR: NEGATIVE
Influenza B by PCR: NEGATIVE
SARS Coronavirus 2 by RT PCR: NEGATIVE

## 2022-01-04 LAB — TROPONIN I (HIGH SENSITIVITY): Troponin I (High Sensitivity): 15 ng/L (ref <18)

## 2022-01-04 MED ORDER — METOPROLOL SUCCINATE ER 25 MG PO TB24: 75.0000 mg | ORAL_TABLET | Freq: Every day | ORAL | 3 refills | Status: AC

## 2022-01-04 MED ORDER — DOXYCYCLINE HYCLATE 100 MG PO CAPS: 100.0000 mg | ORAL_CAPSULE | Freq: Two times a day (BID) | ORAL | 0 refills | Status: DC

## 2022-01-04 MED ORDER — DILTIAZEM LOAD VIA INFUSION
10.0000 mg | Freq: Once | INTRAVENOUS | Status: AC
  Administered 2022-01-04: 10 mg via INTRAVENOUS
  Filled 2022-01-04: qty 10

## 2022-01-04 MED ORDER — FUROSEMIDE 10 MG/ML IJ SOLN
40.0000 mg | Freq: Once | INTRAMUSCULAR | Status: AC
  Administered 2022-01-04: 40 mg via INTRAVENOUS
  Filled 2022-01-04: qty 4

## 2022-01-04 MED ORDER — METOPROLOL SUCCINATE ER 25 MG PO TB24: 75.0000 mg | ORAL_TABLET | Freq: Every day | ORAL | Status: DC

## 2022-01-04 MED ORDER — DILTIAZEM HCL-DEXTROSE 125-5 MG/125ML-% IV SOLN (PREMIX)
5.0000 mg/h | INTRAVENOUS | Status: DC
  Administered 2022-01-04: 5 mg/h via INTRAVENOUS
  Filled 2022-01-04: qty 125

## 2022-01-04 NOTE — ED Notes (Signed)
Pt transferred to Xray.

## 2022-01-04 NOTE — ED Notes (Signed)
Pt son called for ETA as to when they can pick up pt. Per son, they will be on the way soon.  ?

## 2022-01-04 NOTE — ED Triage Notes (Signed)
Pt arrive by EMS from home for c/o palpitations that started last night at 2100 getting worse today around 2:30 am. Pt is chronic Afib with a HR by EMS 144, per EMS ones pt stand up and sit down on their stretcher palpitations stopped.  Pt is AO x 4, denies pain, SOB, NAD noticed on arrival. ?

## 2022-01-04 NOTE — Discharge Instructions (Signed)
You were seen today for shortness of breath and palpitations.  Your heart rate was very high.  You were treated for this.  You were given your home dose of metoprolol this morning.  Do not take it when you get home this morning but restart it tomorrow.  Take 20 mg of Lasix for the next 3 days.  You will also be treated for possible skin infection of the legs.  Follow-up closely with your primary physician. ?

## 2022-01-04 NOTE — ED Provider Notes (Signed)
[facility] EMERGENCY DEPARTMENT Provider Note   CSN: [id number] Arrival date & time: 01/04/22  0405     History  Chief Complaint  Patient presents with   Palpitations    Pt arrive by EMS from home for c/o palpitations that started last night at 2100 getting worse today around 2:30 am. Pt is chronic Afib with a HR by EMS 144, per EMS ones pt stand up and sit down on their stretcher palpitations stopped.     [name] is a 88 y.o. female.  HPI     This is a 88 year old female with a history of chronic atrial fibrillation not on anticoagulation who presents with palpitations and shortness of breath.  Patient reports onset of palpitations yesterday evening.  They got worse throughout the morning.  She denies any chest pain.  She does report shortness of breath worsened when laying flat.  She is also noted some lower extremity swelling.  She lives primarily with her son who is her caregiver.  No recent cough or fevers.  Home Medications Prior to Admission medications   Medication Sig Start Date End Date Taking? Authorizing Provider  doxycycline (VIBRAMYCIN) 100 MG capsule Take 1 capsule (100 mg total) by mouth 2 (two) times daily. 01/04/22  Yes Wyland Rastetter, Barbette Hair, MD  acetaminophen (TYLENOL) 500 MG tablet Take 1,000 mg by mouth every 8 (eight) hours as needed for moderate pain (joint pain).     [provider]  ALPRAZolam Duanne Moron) 0.25 MG tablet Take 1 tablet (0.25 mg total) by mouth at bedtime as needed for anxiety. 12/15/21   Nche, Charlene Brooke, NP  aspirin 81 MG chewable tablet Chew 1 tablet (81 mg total) by mouth daily. 10/26/18   Hennie Duos, MD  fosfomycin (MONUROL) 3 g PACK Take by mouth. 10/25/21   [provider]  furosemide (LASIX) 20 MG tablet Take 1 tablet (20 mg total) by mouth daily. 09/12/21   Nche, Charlene Brooke, NP  gabapentin (NEURONTIN) 100 MG capsule Take 1 capsule (100 mg total) by mouth daily. 09/12/21   Nche,  Charlene Brooke, NP  HYDROcodone-acetaminophen (NORCO/VICODIN) 5-325 MG tablet Take 1 tablet by mouth every 6 (six) hours as needed for moderate pain.  07/30/20   [provider]  levothyroxine (SYNTHROID) 50 MCG tablet Take 1 tablet (50 mcg total) by mouth daily before breakfast. 09/15/21   Nche, Charlene Brooke, NP  metoprolol succinate (TOPROL-XL) 25 MG 24 hr tablet Take 3 tablets (75 mg total) by mouth daily. 09/12/21   Nche, Charlene Brooke, NP  ondansetron (ZOFRAN-ODT) 4 MG disintegrating tablet Take 4 mg by mouth every 8 (eight) hours as needed. 09/09/21   [provider]  potassium chloride SA (KLOR-CON) 20 MEQ tablet Take 1 tablet (20 mEq total) by mouth daily. 09/12/21   Nche, Charlene Brooke, NP      Allergies    Augmentin [amoxicillin-pot clavulanate], Infliximab, Aspirin, Carvedilol, Codeine, Contrast media [iodinated contrast media], Other, and Penicillins    Review of Systems   Review of Systems  Constitutional:  Negative for fever.  Respiratory:  Positive for shortness of breath.   Cardiovascular:  Positive for palpitations and leg swelling. Negative for chest pain.  All other systems reviewed and are negative.  Physical Exam Updated Vital Signs BP 129/74    Pulse 93    Temp 99.3 F (37.4 C) (Oral)    Resp 15    Ht 1.524 m (5')    Wt 51.3 kg  SpO2 100%    BMI 22.07 kg/m  Physical Exam Vitals and nursing note reviewed.  Constitutional:      Appearance: She is well-developed.     Comments: Elderly, nontoxic, no acute distress  HENT:     Head: Normocephalic and atraumatic.     Mouth/Throat:     Mouth: Mucous membranes are moist.  Eyes:     Pupils: Pupils are equal, round, and reactive to light.  Cardiovascular:     Rate and Rhythm: Tachycardia present. Rhythm irregular.     Heart sounds: Normal heart sounds.  Pulmonary:     Effort: Pulmonary effort is normal. No respiratory distress.     Breath sounds: No wheezing.  Abdominal:     Palpations: Abdomen  is soft.     Tenderness: There is no abdominal tenderness.  Musculoskeletal:     Cervical back: Neck supple.     Right lower leg: Edema present.     Left lower leg: Edema present.     Comments: 2+ bilateral lower extremity edema, slight erythema of the bilateral lower extremities  Skin:    General: Skin is warm and dry.  Neurological:     Mental Status: She is alert and oriented to person, place, and time.  Psychiatric:        Mood and Affect: Mood normal.    ED Results / Procedures / Treatments   Labs (all labs ordered are listed, but only abnormal results are displayed) Labs Reviewed  BASIC METABOLIC PANEL - Abnormal; Notable for the following components:      Result Value   Glucose, Bld 118 (*)    Creatinine, Ser 1.15 (*)    Calcium 8.8 (*)    GFR, Estimated 45 (*)    All other components within normal limits  BRAIN NATRIURETIC PEPTIDE - Abnormal; Notable for the following components:   B Natriuretic Peptide 521.5 (*)    All other components within normal limits  CBC - Abnormal; Notable for the following components:   WBC 12.9 (*)    RBC 3.71 (*)    Hemoglobin 10.3 (*)    HCT 32.6 (*)    RDW 17.2 (*)    All other components within normal limits  RESP PANEL BY RT-PCR (FLU A&B, COVID) ARPGX2  MAGNESIUM  TROPONIN I (HIGH SENSITIVITY)  TROPONIN I (HIGH SENSITIVITY)    EKG EKG Interpretation  Date/Time:  Wednesday January 04 2022 04:15:37 EST Ventricular Rate:  135 PR Interval:    QRS Duration: 68 QT Interval:  321 QTC Calculation: 490 R Axis:   5 Text Interpretation: Atrial fibrillation Low voltage, precordial leads Probable anteroseptal infarct, old Repolarization abnormality, prob rate related Confirmed by Thayer Jew [phone]) on 01/04/2022 4:24:53 AM  Radiology DG Chest 2 View  Result Date: 01/04/2022 CLINICAL DATA:  Palpitations EXAM: CHEST - 2 VIEW COMPARISON:  08/30/2021 FINDINGS: Chronic cardiomegaly and diffuse interstitial reticulation with large hiatal  hernia. There is no edema, consolidation, or pneumothorax. Trace pleural effusions. Generalized osteopenia and thoracic kyphosis. Aortic valvular calcification and coronary calcification seen on the lateral view. IMPRESSION: 1. Chronic cardiomegaly and trace pleural effusions. 2. Large hiatal hernia. Electronically Signed   By: Jorje Guild M.D.   On: 01/04/2022 05:00    Procedures .Critical Care Performed by: Merryl Hacker, MD Authorized by: Merryl Hacker, MD   Critical care provider statement:    Critical care time (minutes):  30   Critical care was necessary to treat or prevent imminent or life-threatening deterioration  of the following conditions:  Cardiac failure   Critical care was time spent personally by me on the following activities:  Development of treatment plan with patient or surrogate, discussions with consultants, evaluation of patient's response to treatment, examination of patient, ordering and review of laboratory studies, ordering and review of radiographic studies, ordering and performing treatments and interventions, pulse oximetry, re-evaluation of patient's condition and review of old charts    Medications Ordered in ED Medications  diltiazem (CARDIZEM) 1 mg/mL load via infusion 10 mg (10 mg Intravenous Bolus from Bag 01/04/22 0509)    And  diltiazem (CARDIZEM) 125 mg in dextrose 5% 125 mL (1 mg/mL) infusion (0 mg/hr Intravenous Stopped 01/04/22 0720)  metoprolol succinate (TOPROL-XL) 24 hr tablet 75 mg (has no administration in time range)  furosemide (LASIX) injection 40 mg (40 mg Intravenous Given 01/04/22 8841)    ED Course/ Medical Decision Making/ A&P Clinical Course as of 01/04/22 0726  Wed Jan 04, 2022  0721 Patient's son was updated by phone.  He is agreeable to plan. [CH]    Clinical Course User Index [CH] Kamillah Didonato, Barbette Hair, MD                           Medical Decision Making Amount and/or Complexity of Data Reviewed Labs:  ordered. Radiology: ordered.  Risk Prescription drug management.   This patient presents to the ED for concern of atrial fibrillation with RVR, shortness of breath, leg pain, this involves an extensive number of treatment options, and is a complaint that carries with it a high risk of complications and morbidity.  The differential diagnosis includes heart failure, cellulitis, atrial fibrillation with RVR,  MDM:    This is a 88 year old female who presents with palpitations and shortness of breath.  Also reports some leg pain and swelling.  She is nontoxic.  Initially in atrial fibrillation with heart rate in the 130s.  She is only on rate control medication.  She is not on any anticoagulants.  She was started on diltiazem.  Labs obtained.  No significant metabolic derangements.  Heart rate trended down nicely into the 80s.  She was given her morning dose of metoprolol and titrated off diltiazem.  Clinically she has evidence of some volume overload.  May be related to the acute A-fib and RVR.  She is chronically in A-fib.  Patient was given a dose of Lasix.  She also may have some mild cellulitis.  Leukocytosis to 12 but she is afebrile.  Will start on doxycycline.  On recheck, she states she feels much better rate controlled.  We discussed options.  Given that she has been able to be titrated off the diltiazem, feel that we can trial discharge home.  She is known to be in chronic atrial fibrillation.  She will be discharged with doxycycline for mild cellulitis as well as has been instructed to take her Lasix for the next 3 days.  She has slight cardiomegaly and bilateral trace pleural effusions but is not hypoxic.  She needs close follow-up with her primary physician. (Labs, imaging)  Labs: I Ordered, and personally interpreted labs.  The pertinent results include: Normal troponin, slightly elevated BNP, no significant metabolic derangements, slight leukocytosis  Imaging Studies ordered: I ordered  imaging studies including x-ray was showed slight cardiomegaly I independently visualized and interpreted imaging. I agree with the radiologist interpretation  Additional history obtained from son.  External records from outside source obtained and  reviewed including evaluations  Critical Interventions: IV rate control medication, IV Lasix  Consultations: I requested consultation with the none,  and discussed lab and imaging findings as well as pertinent plan - they recommend: None  Cardiac Monitoring: The patient was maintained on a cardiac monitor.  I personally viewed and interpreted the cardiac monitored which showed an underlying rhythm of: Atrial fibrillation  Reevaluation: After the interventions noted above, I reevaluated the patient and found that they have :improved   Considered admission for: Ongoing symptomatic atrial fibrillation  Social Determinants of Health: Elderly, lives with son  Disposition: Discharge  Co morbidities that complicate the patient evaluation  Past Medical History:  Diagnosis Date   A-fib ([place])    Acute CHF (congestive heart failure) ([place]) 09/02/2019   Aortic stenosis, moderate    last echo in 2010; AVA 1.1; mild AS per echo in June 2013   Colovesical fistula    Diverticulitis    Edema of both legs    s/p laser treatment per Dr. Donnetta Hutching   GAD (generalized anxiety disorder) 05/20/2020   Hiatal hernia    History of chicken pox    HLD (hyperlipidemia)    HTN (hypertension)    Hypertension 04/19/2012   Hypothyroidism    IBS (irritable bowel syndrome)    Iron deficiency 09/19/2017   Iron deficiency anemia    Iron deficiency anemia 02/22/2020   Localized edema 06/21/2012   Overview:  Vascular surgeon - Dr Donnetta Hutching   Neuropathic pain 09/06/2015   Rheumatoid arthritis ([place])      Medicines Meds ordered this encounter  Medications   AND Linked Order Group    diltiazem (CARDIZEM) 1 mg/mL load via infusion 10 mg    diltiazem (CARDIZEM) 125 mg in  dextrose 5% 125 mL (1 mg/mL) infusion   furosemide (LASIX) injection 40 mg   metoprolol succinate (TOPROL-XL) 24 hr tablet 75 mg   doxycycline (VIBRAMYCIN) 100 MG capsule    Sig: Take 1 capsule (100 mg total) by mouth 2 (two) times daily.    Dispense:  14 capsule    Refill:  0    I have reviewed the patients home medicines and have made adjustments as needed  Problem List / ED Course: Problem List Items Addressed This Visit       Cardiovascular and Mediastinum   Benign essential hypertension   Relevant Medications   diltiazem (CARDIZEM) 125 mg in dextrose 5% 125 mL (1 mg/mL) infusion   metoprolol succinate (TOPROL-XL) 24 hr tablet 75 mg (Start on 01/04/2022 10:00 AM)   Other Visit Diagnoses     Atrial fibrillation with RVR (HCC)    -  Primary   Relevant Medications   diltiazem (CARDIZEM) 125 mg in dextrose 5% 125 mL (1 mg/mL) infusion   metoprolol succinate (TOPROL-XL) 24 hr tablet 75 mg (Start on 01/04/2022 10:00 AM)   Lower extremity edema       Cellulitis of lower extremity, unspecified laterality                       Final Clinical Impression(s) / ED Diagnoses Final diagnoses:  Atrial fibrillation with RVR ([place])  Lower extremity edema  Cellulitis of lower extremity, unspecified laterality    Rx / DC Orders ED Discharge Orders          Ordered    Amb referral to AFIB Clinic        01/04/22 0436    doxycycline (VIBRAMYCIN) 100 MG capsule  2 times  daily        01/04/22 0720              Merryl Hacker, MD 01/04/22 [phone]

## 2022-01-05 NOTE — Telephone Encounter (Signed)
Rx sent on 01/04/22 by provider.  ?

## 2022-01-09 DIAGNOSIS — H353123 Nonexudative age-related macular degeneration, left eye, advanced atrophic without subfoveal involvement: Secondary | ICD-10-CM | POA: Diagnosis not present

## 2022-01-09 DIAGNOSIS — H353113 Nonexudative age-related macular degeneration, right eye, advanced atrophic without subfoveal involvement: Secondary | ICD-10-CM | POA: Diagnosis not present

## 2022-01-09 DIAGNOSIS — H53413 Scotoma involving central area, bilateral: Secondary | ICD-10-CM | POA: Diagnosis not present

## 2022-01-09 DIAGNOSIS — H541132 Blindness right eye category 3, low vision left eye category 2: Secondary | ICD-10-CM | POA: Diagnosis not present

## 2022-01-20 DIAGNOSIS — R102 Pelvic and perineal pain: Secondary | ICD-10-CM | POA: Diagnosis not present

## 2022-01-20 DIAGNOSIS — N3020 Other chronic cystitis without hematuria: Secondary | ICD-10-CM | POA: Diagnosis not present

## 2022-01-20 DIAGNOSIS — N1330 Unspecified hydronephrosis: Secondary | ICD-10-CM | POA: Diagnosis not present

## 2022-01-20 DIAGNOSIS — N321 Vesicointestinal fistula: Secondary | ICD-10-CM | POA: Diagnosis not present

## 2022-01-30 ENCOUNTER — Encounter: Payer: Self-pay | Admitting: Nurse Practitioner

## 2022-02-02 ENCOUNTER — Ambulatory Visit: Payer: TRICARE For Life (TFL) | Admitting: Nurse Practitioner

## 2022-02-16 ENCOUNTER — Encounter: Payer: Self-pay | Admitting: Nurse Practitioner

## 2022-02-16 ENCOUNTER — Telehealth: Payer: Self-pay | Admitting: Nurse Practitioner

## 2022-02-16 DIAGNOSIS — F411 Generalized anxiety disorder: Secondary | ICD-10-CM

## 2022-02-16 MED ORDER — ALPRAZOLAM 0.25 MG PO TABS: 0.2500 mg | ORAL_TABLET | Freq: Every evening | ORAL | 1 refills | Status: DC | PRN

## 2022-02-16 NOTE — Telephone Encounter (Signed)
Pt needs her scripts to now go to [facility] Address: [address] [place], [place] [address] Phone: [phone] ?

## 2022-02-16 NOTE — Telephone Encounter (Signed)
Alprazolam sent to [facility] ?Call [facility] and cancel refills there ?

## 2022-02-16 NOTE — Telephone Encounter (Signed)
Lft VM to rtn call.  RX was sent to [facility] on 12/12/21, #30, 2 refills.  Did she move RX? Dm/cma ? ?

## 2022-02-16 NOTE — Telephone Encounter (Signed)
Spoke to [employer] and he will cancel refills. Dm/cma ? ?

## 2022-02-24 ENCOUNTER — Telehealth: Payer: Self-pay | Admitting: Nurse Practitioner

## 2022-02-24 ENCOUNTER — Ambulatory Visit: Payer: TRICARE For Life (TFL) | Admitting: Nurse Practitioner

## 2022-02-24 NOTE — Telephone Encounter (Signed)
Per pt:  ?[name]  P [facility] Admin (supporting Mychart, Generic) 17 hours ago (4:21 PM)  ? ?PLEASE CANCEL THIS APPOINTMENT. Thank you. ? ?No show fee waived because it was before 5 the day before. Left on schedule due to staffing shortages ?

## 2022-02-27 DIAGNOSIS — H43393 Other vitreous opacities, bilateral: Secondary | ICD-10-CM | POA: Diagnosis not present

## 2022-02-27 DIAGNOSIS — H16223 Keratoconjunctivitis sicca, not specified as Sjogren's, bilateral: Secondary | ICD-10-CM | POA: Diagnosis not present

## 2022-02-27 DIAGNOSIS — H353113 Nonexudative age-related macular degeneration, right eye, advanced atrophic without subfoveal involvement: Secondary | ICD-10-CM | POA: Diagnosis not present

## 2022-02-27 DIAGNOSIS — Z961 Presence of intraocular lens: Secondary | ICD-10-CM | POA: Diagnosis not present

## 2022-02-27 DIAGNOSIS — H04129 Dry eye syndrome of unspecified lacrimal gland: Secondary | ICD-10-CM | POA: Diagnosis not present

## 2022-03-27 ENCOUNTER — Encounter: Payer: Self-pay | Admitting: Nurse Practitioner

## 2022-03-29 NOTE — Telephone Encounter (Signed)
Appt scheduled w/ Dr. Ethelene Hal for 6/5

## 2022-04-03 ENCOUNTER — Encounter: Payer: Self-pay | Admitting: Family Medicine

## 2022-04-03 ENCOUNTER — Ambulatory Visit (INDEPENDENT_AMBULATORY_CARE_PROVIDER_SITE_OTHER): Payer: Medicare Other | Admitting: Family Medicine

## 2022-04-03 VITALS — BP 138/68 | HR 81 | Temp 97.1°F | Ht 60.0 in | Wt 110.8 lb

## 2022-04-03 DIAGNOSIS — F419 Anxiety disorder, unspecified: Secondary | ICD-10-CM

## 2022-04-03 DIAGNOSIS — I5042 Chronic combined systolic (congestive) and diastolic (congestive) heart failure: Secondary | ICD-10-CM | POA: Diagnosis not present

## 2022-04-03 DIAGNOSIS — E039 Hypothyroidism, unspecified: Secondary | ICD-10-CM

## 2022-04-03 MED ORDER — ALPRAZOLAM 0.5 MG PO TABS: 0.5000 mg | ORAL_TABLET | Freq: Every day | ORAL | 2 refills | Status: AC

## 2022-04-03 NOTE — Progress Notes (Addendum)
Established Patient Office Visit  Subjective   Patient ID: [name], female    DOB: [birth year]  Age: 88 y.o. MRN: [id number]  Chief Complaint  Patient presents with   Shortness of Breath    SOB x 1 week come and go. Very restless at night medication not helping. Would like order for oxygen tank at home.     Shortness of Breath Associated symptoms include leg swelling and orthopnea. Pertinent negatives include no abdominal pain, chest pain, claudication, PND or vomiting.   evaluation of a weeklong history of nocturnal shortness of breath.  She has felt anxious due to her age and chronic health conditions.  History of CHF, anxiety, hypertension and PACs.  She is taking 0.25 of Xanax nightly after her husband's death 6 to 7 years ago.  She has elevated at night for sleeping and this is not changed.  She is able to exercise with her walker about her house and outside daily without shortness of breath chest pain nausea or diaphoresis.  Has been out of her thyroid medicine for some time now.    Review of Systems  Constitutional:  Negative for chills, diaphoresis, malaise/fatigue and weight loss.  HENT: Negative.    Eyes: Negative.  Negative for blurred vision and double vision.  Respiratory:  Positive for shortness of breath.   Cardiovascular:  Positive for orthopnea and leg swelling. Negative for chest pain, palpitations, claudication and PND.  Gastrointestinal:  Negative for abdominal pain, nausea and vomiting.  Genitourinary: Negative.   Musculoskeletal:  Negative for falls and myalgias.  Neurological:  Negative for speech change, loss of consciousness and weakness.  Psychiatric/Behavioral:  The patient is nervous/anxious.       Objective:     BP 138/68 (BP Location: Right Arm, Patient Position: Sitting, Cuff Size: Normal)   Pulse 81   Temp (!) 97.1 F (36.2 C) (Temporal)   Ht 5' (1.524 m)   Wt 110 lb 12.8 oz (50.3 kg)   SpO2 98%   BMI 21.64 kg/m    Physical  Exam Constitutional:      General: She is not in acute distress.    Appearance: Normal appearance. She is not ill-appearing, toxic-appearing or diaphoretic.  HENT:     Head: Normocephalic and atraumatic.     Right Ear: External ear normal.     Left Ear: External ear normal.     Mouth/Throat:     Mouth: Mucous membranes are moist.     Pharynx: Oropharynx is clear. No oropharyngeal exudate or posterior oropharyngeal erythema.  Eyes:     General: No scleral icterus.       Right eye: No discharge.        Left eye: No discharge.     Extraocular Movements: Extraocular movements intact.     Conjunctiva/sclera: Conjunctivae normal.  Cardiovascular:     Rate and Rhythm: Normal rate and regular rhythm. Occasional Extrasystoles are present.    Heart sounds: Murmur heard.  Pulmonary:     Effort: Pulmonary effort is normal. No accessory muscle usage or respiratory distress.     Breath sounds: Normal breath sounds. No decreased breath sounds, wheezing, rhonchi or rales.  Abdominal:     General: Bowel sounds are normal.     Tenderness: There is no abdominal tenderness. There is no guarding.  Musculoskeletal:     Cervical back: No rigidity or tenderness.     Right lower leg: Edema present.     Left lower leg: Edema present.  Legs:  Skin:    General: Skin is warm and dry.  Neurological:     Mental Status: She is alert and oriented to person, place, and time.  Psychiatric:        Mood and Affect: Mood normal.        Behavior: Behavior normal.      Results for orders placed or performed in visit on 04/03/22  TSH  Result Value Ref Range   TSH 3.87 0.35 - 5.50 uIU/mL  Basic metabolic panel  Result Value Ref Range   Sodium 137 135 - 145 mEq/L   Potassium 4.4 3.5 - 5.1 mEq/L   Chloride 92 (L) 96 - 112 mEq/L   CO2 33 (H) 19 - 32 mEq/L   Glucose, Bld 91 70 - 99 mg/dL   BUN 19 6 - 23 mg/dL   Creatinine, Ser 1.23 (H) 0.40 - 1.20 mg/dL   GFR 38.08 (L) >60.00 mL/min   Calcium 8.7 8.4  - 10.5 mg/dL  CBC  Result Value Ref Range   WBC 11.0 (H) 4.0 - 10.5 K/uL   RBC 3.65 (L) 3.87 - 5.11 Mil/uL   Platelets 401.0 (H) 150.0 - 400.0 K/uL   Hemoglobin 10.0 (L) 12.0 - 15.0 g/dL   HCT 30.6 (L) 36.0 - 46.0 %   MCV 83.8 78.0 - 100.0 fl   MCHC 32.6 30.0 - 36.0 g/dL   RDW 18.5 (H) 11.5 - 15.5 %  B Nat Peptide  Result Value Ref Range   Pro B Natriuretic peptide (BNP) 780.0 (H) 0.0 - 100.0 pg/mL      The ASCVD Risk score (Arnett DK, et al., 2019) failed to calculate for the following reasons:   The 2019 ASCVD risk score is only valid for ages 18 to 3    Assessment & Plan:   Problem List Items Addressed This Visit       Cardiovascular and Mediastinum   Chronic combined systolic and diastolic congestive heart failure (HCC) (Chronic)   Relevant Orders   Basic metabolic panel (Completed)   CBC (Completed)   B Nat Peptide (Completed)   Ambulatory referral to Cardiology     Endocrine   Hypothyroid   Relevant Orders   TSH (Completed)     Other   Anxiety - Primary   Relevant Medications   ALPRAZolam (XANAX) 0.5 MG tablet    Return in about 6 weeks (around 05/15/2022).  Increase Xanax .5 mg nightly.  Continue all current medications.  She is satting 98%.  Do not believe that she needs oxygen at this time. Consider myxedema.   Libby Maw, MD

## 2022-04-04 ENCOUNTER — Encounter: Payer: Self-pay | Admitting: Nurse Practitioner

## 2022-04-04 LAB — BASIC METABOLIC PANEL
BUN: 19 mg/dL (ref 6–23)
CO2: 33 mEq/L — ABNORMAL HIGH (ref 19–32)
Calcium: 8.7 mg/dL (ref 8.4–10.5)
Chloride: 92 mEq/L — ABNORMAL LOW (ref 96–112)
Creatinine, Ser: 1.23 mg/dL — ABNORMAL HIGH (ref 0.40–1.20)
GFR: 38.08 mL/min — ABNORMAL LOW (ref >60.00)
Glucose, Bld: 91 mg/dL (ref 70–99)
Potassium: 4.4 mEq/L (ref 3.5–5.1)
Sodium: 137 mEq/L (ref 135–145)

## 2022-04-04 LAB — CBC
HCT: 30.6 % — ABNORMAL LOW (ref 36.0–46.0)
Hemoglobin: 10 g/dL — ABNORMAL LOW (ref 12.0–15.0)
MCHC: 32.6 g/dL (ref 30.0–36.0)
MCV: 83.8 fl (ref 78.0–100.0)
Platelets: 401 10*3/uL — ABNORMAL HIGH (ref 150.0–400.0)
RBC: 3.65 Mil/uL — ABNORMAL LOW (ref 3.87–5.11)
RDW: 18.5 % — ABNORMAL HIGH (ref 11.5–15.5)
WBC: 11 10*3/uL — ABNORMAL HIGH (ref 4.0–10.5)

## 2022-04-04 LAB — TSH: TSH: 3.87 u[IU]/mL (ref 0.35–5.50)

## 2022-04-04 LAB — BRAIN NATRIURETIC PEPTIDE: Pro B Natriuretic peptide (BNP): 780 pg/mL — ABNORMAL HIGH (ref 0.0–100.0)

## 2022-04-04 NOTE — Telephone Encounter (Signed)
Spoke w/ pt's husband, adv that pt has refills until 08/2022. He'll need to call the pharmacy and allow them time to fill this medication.

## 2022-04-06 NOTE — Addendum Note (Signed)
Addended by: Abelino Derrick A on: 04/06/2022 11:35 AM   Modules accepted: Orders

## 2022-04-14 DIAGNOSIS — N1330 Unspecified hydronephrosis: Secondary | ICD-10-CM | POA: Diagnosis not present

## 2022-04-14 DIAGNOSIS — N3020 Other chronic cystitis without hematuria: Secondary | ICD-10-CM | POA: Diagnosis not present

## 2022-05-11 ENCOUNTER — Other Ambulatory Visit: Payer: Self-pay

## 2022-05-11 ENCOUNTER — Emergency Department (HOSPITAL_COMMUNITY): Payer: Medicare Other

## 2022-05-11 ENCOUNTER — Encounter (HOSPITAL_COMMUNITY): Payer: Self-pay | Admitting: Physician Assistant

## 2022-05-11 DIAGNOSIS — I4821 Permanent atrial fibrillation: Secondary | ICD-10-CM | POA: Diagnosis present

## 2022-05-11 DIAGNOSIS — I350 Nonrheumatic aortic (valve) stenosis: Secondary | ICD-10-CM | POA: Diagnosis present

## 2022-05-11 DIAGNOSIS — R063 Periodic breathing: Secondary | ICD-10-CM | POA: Diagnosis not present

## 2022-05-11 DIAGNOSIS — D500 Iron deficiency anemia secondary to blood loss (chronic): Secondary | ICD-10-CM | POA: Diagnosis not present

## 2022-05-11 DIAGNOSIS — Z7982 Long term (current) use of aspirin: Secondary | ICD-10-CM

## 2022-05-11 DIAGNOSIS — Z79899 Other long term (current) drug therapy: Secondary | ICD-10-CM

## 2022-05-11 DIAGNOSIS — N179 Acute kidney failure, unspecified: Secondary | ICD-10-CM

## 2022-05-11 DIAGNOSIS — D631 Anemia in chronic kidney disease: Secondary | ICD-10-CM | POA: Diagnosis not present

## 2022-05-11 DIAGNOSIS — F0392 Unspecified dementia, unspecified severity, with psychotic disturbance: Secondary | ICD-10-CM | POA: Diagnosis present

## 2022-05-11 DIAGNOSIS — Z66 Do not resuscitate: Secondary | ICD-10-CM | POA: Diagnosis present

## 2022-05-11 DIAGNOSIS — G629 Polyneuropathy, unspecified: Secondary | ICD-10-CM | POA: Diagnosis present

## 2022-05-11 DIAGNOSIS — Z515 Encounter for palliative care: Secondary | ICD-10-CM

## 2022-05-11 DIAGNOSIS — Z880 Allergy status to penicillin: Secondary | ICD-10-CM

## 2022-05-11 DIAGNOSIS — R0789 Other chest pain: Secondary | ICD-10-CM | POA: Diagnosis not present

## 2022-05-11 DIAGNOSIS — I213 ST elevation (STEMI) myocardial infarction of unspecified site: Secondary | ICD-10-CM | POA: Diagnosis not present

## 2022-05-11 DIAGNOSIS — I5042 Chronic combined systolic (congestive) and diastolic (congestive) heart failure: Secondary | ICD-10-CM | POA: Diagnosis present

## 2022-05-11 DIAGNOSIS — D649 Anemia, unspecified: Secondary | ICD-10-CM | POA: Diagnosis present

## 2022-05-11 DIAGNOSIS — Z87891 Personal history of nicotine dependence: Secondary | ICD-10-CM

## 2022-05-11 DIAGNOSIS — E039 Hypothyroidism, unspecified: Secondary | ICD-10-CM | POA: Diagnosis present

## 2022-05-11 DIAGNOSIS — K921 Melena: Secondary | ICD-10-CM | POA: Diagnosis not present

## 2022-05-11 DIAGNOSIS — I4819 Other persistent atrial fibrillation: Secondary | ICD-10-CM | POA: Diagnosis not present

## 2022-05-11 DIAGNOSIS — E785 Hyperlipidemia, unspecified: Secondary | ICD-10-CM | POA: Diagnosis present

## 2022-05-11 DIAGNOSIS — K449 Diaphragmatic hernia without obstruction or gangrene: Secondary | ICD-10-CM | POA: Diagnosis present

## 2022-05-11 DIAGNOSIS — Z825 Family history of asthma and other chronic lower respiratory diseases: Secondary | ICD-10-CM

## 2022-05-11 DIAGNOSIS — I10 Essential (primary) hypertension: Secondary | ICD-10-CM | POA: Diagnosis not present

## 2022-05-11 DIAGNOSIS — Z79890 Hormone replacement therapy (postmenopausal): Secondary | ICD-10-CM | POA: Diagnosis not present

## 2022-05-11 DIAGNOSIS — F419 Anxiety disorder, unspecified: Secondary | ICD-10-CM | POA: Diagnosis present

## 2022-05-11 DIAGNOSIS — Z886 Allergy status to analgesic agent status: Secondary | ICD-10-CM

## 2022-05-11 DIAGNOSIS — Z91041 Radiographic dye allergy status: Secondary | ICD-10-CM

## 2022-05-11 DIAGNOSIS — K5792 Diverticulitis of intestine, part unspecified, without perforation or abscess without bleeding: Secondary | ICD-10-CM | POA: Diagnosis not present

## 2022-05-11 DIAGNOSIS — I130 Hypertensive heart and chronic kidney disease with heart failure and stage 1 through stage 4 chronic kidney disease, or unspecified chronic kidney disease: Secondary | ICD-10-CM | POA: Diagnosis not present

## 2022-05-11 DIAGNOSIS — D473 Essential (hemorrhagic) thrombocythemia: Secondary | ICD-10-CM | POA: Diagnosis present

## 2022-05-11 DIAGNOSIS — R9431 Abnormal electrocardiogram [ECG] [EKG]: Secondary | ICD-10-CM | POA: Diagnosis not present

## 2022-05-11 DIAGNOSIS — I25110 Atherosclerotic heart disease of native coronary artery with unstable angina pectoris: Secondary | ICD-10-CM | POA: Diagnosis present

## 2022-05-11 DIAGNOSIS — M792 Neuralgia and neuritis, unspecified: Secondary | ICD-10-CM | POA: Diagnosis not present

## 2022-05-11 DIAGNOSIS — I5043 Acute on chronic combined systolic (congestive) and diastolic (congestive) heart failure: Secondary | ICD-10-CM | POA: Diagnosis present

## 2022-05-11 DIAGNOSIS — I472 Ventricular tachycardia, unspecified: Secondary | ICD-10-CM | POA: Diagnosis not present

## 2022-05-11 DIAGNOSIS — I249 Acute ischemic heart disease, unspecified: Secondary | ICD-10-CM | POA: Diagnosis not present

## 2022-05-11 DIAGNOSIS — M069 Rheumatoid arthritis, unspecified: Secondary | ICD-10-CM | POA: Diagnosis present

## 2022-05-11 DIAGNOSIS — E875 Hyperkalemia: Secondary | ICD-10-CM | POA: Diagnosis not present

## 2022-05-11 DIAGNOSIS — F0394 Unspecified dementia, unspecified severity, with anxiety: Secondary | ICD-10-CM | POA: Diagnosis present

## 2022-05-11 DIAGNOSIS — Z8249 Family history of ischemic heart disease and other diseases of the circulatory system: Secondary | ICD-10-CM

## 2022-05-11 DIAGNOSIS — N1831 Chronic kidney disease, stage 3a: Secondary | ICD-10-CM | POA: Diagnosis present

## 2022-05-11 DIAGNOSIS — Z885 Allergy status to narcotic agent status: Secondary | ICD-10-CM

## 2022-05-11 DIAGNOSIS — R079 Chest pain, unspecified: Secondary | ICD-10-CM | POA: Diagnosis not present

## 2022-05-11 DIAGNOSIS — R072 Precordial pain: Secondary | ICD-10-CM | POA: Diagnosis not present

## 2022-05-11 DIAGNOSIS — R0902 Hypoxemia: Secondary | ICD-10-CM | POA: Diagnosis not present

## 2022-05-11 DIAGNOSIS — Z888 Allergy status to other drugs, medicaments and biological substances status: Secondary | ICD-10-CM

## 2022-05-11 DIAGNOSIS — Z881 Allergy status to other antibiotic agents status: Secondary | ICD-10-CM

## 2022-05-11 DIAGNOSIS — Z7189 Other specified counseling: Secondary | ICD-10-CM | POA: Diagnosis not present

## 2022-05-11 DIAGNOSIS — I499 Cardiac arrhythmia, unspecified: Secondary | ICD-10-CM | POA: Diagnosis not present

## 2022-05-11 LAB — CBC WITH DIFFERENTIAL/PLATELET
Abs Immature Granulocytes: 0.06 10*3/uL (ref 0.00–0.07)
Basophils Absolute: 0.1 10*3/uL (ref 0.0–0.1)
Basophils Relative: 1 %
Eosinophils Absolute: 0 10*3/uL (ref 0.0–0.5)
Eosinophils Relative: 0 %
HCT: 34.1 % — ABNORMAL LOW (ref 36.0–46.0)
Hemoglobin: 10.2 g/dL — ABNORMAL LOW (ref 12.0–15.0)
Immature Granulocytes: 0 %
Lymphocytes Relative: 13 %
Lymphs Abs: 1.8 10*3/uL (ref 0.7–4.0)
MCH: 27.3 pg (ref 26.0–34.0)
MCHC: 29.9 g/dL — ABNORMAL LOW (ref 30.0–36.0)
MCV: 91.4 fL (ref 80.0–100.0)
Monocytes Absolute: 0.6 10*3/uL (ref 0.1–1.0)
Monocytes Relative: 5 %
Neutro Abs: 11.2 10*3/uL — ABNORMAL HIGH (ref 1.7–7.7)
Neutrophils Relative %: 81 %
Platelets: 367 10*3/uL (ref 150–400)
RBC: 3.73 MIL/uL — ABNORMAL LOW (ref 3.87–5.11)
RDW: 15.9 % — ABNORMAL HIGH (ref 11.5–15.5)
WBC: 13.9 10*3/uL — ABNORMAL HIGH (ref 4.0–10.5)
nRBC: 0 % (ref 0.0–0.2)

## 2022-05-11 LAB — TSH: TSH: 7.866 u[IU]/mL — ABNORMAL HIGH (ref 0.350–4.500)

## 2022-05-11 LAB — LIPID PANEL
Cholesterol: 129 mg/dL (ref 0–200)
HDL: 40 mg/dL — ABNORMAL LOW (ref >40)
LDL Cholesterol: 72 mg/dL (ref 0–99)
Total CHOL/HDL Ratio: 3.2 RATIO
Triglycerides: 84 mg/dL (ref <150)
VLDL: 17 mg/dL (ref 0–40)

## 2022-05-11 LAB — COMPREHENSIVE METABOLIC PANEL
ALT: 11 U/L (ref 0–44)
AST: 35 U/L (ref 15–41)
Albumin: 2.7 g/dL — ABNORMAL LOW (ref 3.5–5.0)
Alkaline Phosphatase: 91 U/L (ref 38–126)
Anion gap: 13 (ref 5–15)
BUN: 16 mg/dL (ref 8–23)
CO2: 23 mmol/L (ref 22–32)
Calcium: 8.5 mg/dL — ABNORMAL LOW (ref 8.9–10.3)
Chloride: 101 mmol/L (ref 98–111)
Creatinine, Ser: 1.36 mg/dL — ABNORMAL HIGH (ref 0.44–1.00)
GFR, Estimated: 37 mL/min — ABNORMAL LOW (ref >60)
Glucose, Bld: 139 mg/dL — ABNORMAL HIGH (ref 70–99)
Potassium: 4.2 mmol/L (ref 3.5–5.1)
Sodium: 137 mmol/L (ref 135–145)
Total Bilirubin: 1 mg/dL (ref 0.3–1.2)
Total Protein: 7.3 g/dL (ref 6.5–8.1)

## 2022-05-11 LAB — PROTIME-INR
INR: >10 (ref 0.8–1.2)
INR: 1.2 (ref 0.8–1.2)
Prothrombin Time: >90 seconds — ABNORMAL HIGH (ref 11.4–15.2)
Prothrombin Time: 15.2 seconds (ref 11.4–15.2)

## 2022-05-11 LAB — MAGNESIUM: Magnesium: 1.8 mg/dL (ref 1.7–2.4)

## 2022-05-11 LAB — HEMOGLOBIN A1C
Hgb A1c MFr Bld: 5.6 % (ref 4.8–5.6)
Mean Plasma Glucose: 114.02 mg/dL

## 2022-05-11 LAB — I-STAT CHEM 8, ED
BUN: 16 mg/dL (ref 8–23)
Calcium, Ion: 1.03 mmol/L — ABNORMAL LOW (ref 1.15–1.40)
Chloride: 103 mmol/L (ref 98–111)
Creatinine, Ser: 1.3 mg/dL — ABNORMAL HIGH (ref 0.44–1.00)
Glucose, Bld: 133 mg/dL — ABNORMAL HIGH (ref 70–99)
HCT: 33 % — ABNORMAL LOW (ref 36.0–46.0)
Hemoglobin: 11.2 g/dL — ABNORMAL LOW (ref 12.0–15.0)
Potassium: 4.1 mmol/L (ref 3.5–5.1)
Sodium: 136 mmol/L (ref 135–145)
TCO2: 24 mmol/L (ref 22–32)

## 2022-05-11 LAB — APTT: aPTT: 81 seconds — ABNORMAL HIGH (ref 24–36)

## 2022-05-11 LAB — TROPONIN I (HIGH SENSITIVITY)
Troponin I (High Sensitivity): 1607 ng/L (ref <18)
Troponin I (High Sensitivity): 4759 ng/L (ref <18)
Troponin I (High Sensitivity): 8984 ng/L (ref <18)
Troponin I (High Sensitivity): >24000 ng/L (ref <18)

## 2022-05-11 MED ORDER — HEPARIN (PORCINE) 25000 UT/250ML-% IV SOLN
950.0000 [IU]/h | INTRAVENOUS | Status: DC
  Administered 2022-05-11: 600 [IU]/h via INTRAVENOUS
  Filled 2022-05-11: qty 250

## 2022-05-11 MED ORDER — METOPROLOL SUCCINATE ER 25 MG PO TB24
75.0000 mg | ORAL_TABLET | Freq: Every day | ORAL | Status: DC
  Administered 2022-05-11 – 2022-05-12 (×2): 75 mg via ORAL
  Filled 2022-05-11 (×2): qty 1

## 2022-05-11 MED ORDER — GABAPENTIN 100 MG PO CAPS
100.0000 mg | ORAL_CAPSULE | Freq: Every day | ORAL | Status: DC
Start: 2022-05-11 — End: 2022-05-12
  Filled 2022-05-11 (×2): qty 1

## 2022-05-11 MED ORDER — ONDANSETRON HCL 4 MG/2ML IJ SOLN
4.0000 mg | Freq: Four times a day (QID) | INTRAMUSCULAR | Status: DC | PRN
  Administered 2022-05-11 – 2022-05-12 (×2): 4 mg via INTRAVENOUS
  Filled 2022-05-11 (×2): qty 2

## 2022-05-11 MED ORDER — ACETAMINOPHEN 325 MG PO TABS: 650.0000 mg | ORAL_TABLET | ORAL | Status: DC | PRN

## 2022-05-11 MED ORDER — FENTANYL CITRATE PF 50 MCG/ML IJ SOSY
50.0000 ug | PREFILLED_SYRINGE | Freq: Once | INTRAMUSCULAR | Status: AC
  Administered 2022-05-11: 50 ug via INTRAVENOUS
  Filled 2022-05-11: qty 1

## 2022-05-11 MED ORDER — HYDROMORPHONE HCL 1 MG/ML IJ SOLN
0.5000 mg | Freq: Once | INTRAMUSCULAR | Status: AC
  Administered 2022-05-11: 0.5 mg via INTRAVENOUS
  Filled 2022-05-11: qty 1

## 2022-05-11 MED ORDER — ONDANSETRON HCL 4 MG/2ML IJ SOLN
4.0000 mg | Freq: Once | INTRAMUSCULAR | Status: AC
  Administered 2022-05-11: 4 mg via INTRAVENOUS
  Filled 2022-05-11: qty 2

## 2022-05-11 MED ORDER — ALPRAZOLAM 0.5 MG PO TABS
0.5000 mg | ORAL_TABLET | Freq: Every day | ORAL | Status: DC
  Administered 2022-05-11: 0.5 mg via ORAL
  Filled 2022-05-11: qty 1

## 2022-05-11 MED ORDER — HYDROMORPHONE HCL 1 MG/ML IJ SOLN
0.5000 mg | INTRAMUSCULAR | Status: DC | PRN
  Administered 2022-05-12 (×2): 0.5 mg via INTRAVENOUS
  Filled 2022-05-11 (×2): qty 0.5

## 2022-05-11 MED ORDER — NITROGLYCERIN IN D5W 200-5 MCG/ML-% IV SOLN
0.0000 ug/min | INTRAVENOUS | Status: DC
  Administered 2022-05-11: 5 ug/min via INTRAVENOUS
  Filled 2022-05-11: qty 250

## 2022-05-11 MED ORDER — SODIUM CHLORIDE 0.9 % IV SOLN: INTRAVENOUS | Status: DC

## 2022-05-11 MED ORDER — SODIUM CHLORIDE 0.9 % IV BOLUS
500.0000 mL | Freq: Once | INTRAVENOUS | Status: AC
  Administered 2022-05-11: 500 mL via INTRAVENOUS

## 2022-05-11 NOTE — H&P (Signed)
History and Physical   [name] DXA:[id number] DOB: 1927-02-19 DOA: 05/24/2022  PCP: [name], NP   Patient coming from: Home  Chief Complaint: Chest pain/STEMI  HPI: [name] is a 88 y.o. female with medical history significant of atrial fibrillation, CHF, aortic stenosis, hyperlipidemia, hypertension, hypothyroidism, RA, neuropathy, anemia, anxiety, essential thrombocythemia presenting with chest pain and presenting as STEMI.  Patient initially presented due to chest pain as a code STEMI with ST elevations in the field.  Patient was evaluated by interventional cardiology and patient declined catheterization.  Repeat EKG was improved.  She reports that she woke with severe chest pain and pain in her upper abdomen.  The pain has been waxing and waning today with intermittent radiation to her back.  In the ED pain described as a severe throbbing pain.  She denies fevers, chills, shortness of breath, constipation, diarrhea, nausea, vomiting.  ED Course: Vital signs in the ED significant for heart rate in the 90s to 100s, respiratory rate in the 20s, blood pressure in the 209O to 709G systolic.  Lab work-up included CMP with creatinine elevated 1.36 for baseline of 1, glucose 139, calcium 8.5, albumin 2.7.  CBC showed stable hemoglobin at 10.2, leukocytosis to 13.9.  PT and INR initially severely elevated however repeat showed this was an error and PT and INR were normal.  PTT elevated 81.  A1c normal, lipid panel normal except for HDL 40.  Troponin trend was 1607, 4759, 8984.  Patient chest x-ray showed changes consistent with large hiatal hernia increase in size recommending CT for further evaluation for obstruction.  CT of the chest and pelvis showed chronic diverticulitis, large hernia with increased gastric distention but no changes of obstruction, moderate pulmonary edema, moderate pleural effusion.  Patient received fentanyl, Dilaudid, nitro drip, heparin drip,  Zofran, 500 cc bolus in the ED.  Cardiology was consulted and agreed with heparin drip and nitro drip but patient had declined catheterization.  Cardiology will follow.  Review of Systems: As per HPI otherwise all other systems reviewed and are negative.  Past Medical History:  Diagnosis Date   A-fib ([place])    Acute CHF (congestive heart failure) ([place]) 09/02/2019   Aortic stenosis, moderate    last echo in 2010; AVA 1.1; mild AS per echo in June 2013   Colovesical fistula    Diverticulitis    Edema of both legs    s/p laser treatment per Dr. Donnetta Hutching   GAD (generalized anxiety disorder) 05/20/2020   Hiatal hernia    History of chicken pox    HLD (hyperlipidemia)    Hypertension 04/19/2012   Hypothyroidism    IBS (irritable bowel syndrome)    Iron deficiency anemia 09/19/2017   and 02/22/2020   Localized edema 06/21/2012   Overview:  Vascular surgeon - Dr Donnetta Hutching   Neuropathic pain 09/06/2015   Rheumatoid arthritis ([place])     Past Surgical History:  Procedure Laterality Date   [place] Right    CATARACT EXTRACTION     ENDOSCOPIC VEIN LASER TREATMENT     ENDOVENOUS ABLATION SAPHENOUS VEIN W/ LASER  08-29-2012   left greater saphenous vein   Curt Jews MD   ENDOVENOUS ABLATION SAPHENOUS VEIN W/ LASER  09-19-2012   right greater saphenous vein by Curt Jews MD   EYE SURGERY  2005   Bilateral cataract   FOOT SURGERY  2003   bilateral Hammer Toe   stab phlebectomy Right 01-02-2013  10-15 incisions right thigh and calf by Curt Jews MD    Social History  reports that she quit smoking about 38 years ago. Her smoking use included cigarettes. She has never used smokeless tobacco. She reports that she does not drink alcohol and does not use drugs.  Allergies  Allergen Reactions   Augmentin [Amoxicillin-Pot Clavulanate] Nausea Only and Other (See Comments)    CAUSED EXTREME NAUSEA (patient is physically unable to vomit due to the fact part of her  intestines have been removed)   Infliximab Other (See Comments)    REACTION: "Enlarged intestines and hernia. Also pushed intestines on lower part of lungs."    Aspirin Nausea And Vomiting and Other (See Comments)    Low Dose is ok- Stomach problems   Carvedilol Other (See Comments)    "Fatigue/ ill"   Codeine Nausea And Vomiting and Other (See Comments)    "Deathly Sick"   Contrast Media [Iodinated Contrast Media] Other (See Comments)    All-over body pain   Other Nausea Only and Other (See Comments)    Most antibiotics cause the patient to feel "deathly sick" and she "cannot vomit" because part of her intestines have been removed.   Penicillins Rash    Has patient had a PCN reaction causing immediate rash, facial/tongue/throat swelling, SOB or lightheadedness with hypotension: Y Has patient had a PCN reaction causing severe rash involving mucus membranes or skin necrosis: Y Has patient had a PCN reaction that required hospitalization: N Has patient had a PCN reaction occurring within the last 10 years: N If all of the above answers are "NO", then may proceed with Cephalosporin use.     Family History  Problem Relation Age of Onset   Heart disease Mother    Peripheral vascular disease Mother    Heart disease Father    Peripheral vascular disease Father        Right leg amputation   COPD Father    Heart disease Brother 67       Heart Disease before age 66   Heart attack Brother    Peripheral vascular disease Other    Hypertension Sister   Reviewed on admission  Prior to Admission medications   Medication Sig Start Date End Date Taking? Authorizing Provider  acetaminophen (TYLENOL) 500 MG tablet Take 1,000 mg by mouth every 8 (eight) hours as needed for moderate pain (joint pain).    Yes [provider]  ALPRAZolam (XANAX) 0.5 MG tablet Take 1 tablet (0.5 mg total) by mouth at bedtime. 04/03/22  Yes Libby Maw, MD  gabapentin (NEURONTIN) 100 MG capsule Take  1 capsule (100 mg total) by mouth daily. 09/12/21  Yes Nche, Charlene Brooke, NP  metoprolol succinate (TOPROL-XL) 25 MG 24 hr tablet Take 3 tablets (75 mg total) by mouth daily. 01/04/22  Yes Nche, Charlene Brooke, NP  fosfomycin (MONUROL) 3 g PACK Take by mouth. Patient not taking: Reported on 05/04/2022 10/25/21   [provider]  levothyroxine (SYNTHROID) 50 MCG tablet Take 1 tablet (50 mcg total) by mouth daily before breakfast. Patient not taking: Reported on 04/03/2022 09/15/21   [name], NP  loteprednol (LOTEMAX) 0.5 % ophthalmic suspension SMARTSIG:In Eye(s) Patient not taking: Reported on 05/24/2022 03/01/22   [provider]    Physical Exam: Vitals:   05/10/2022 1915 05/07/2022 1930 04/29/2022 1945 05/15/2022 2000  BP: (!) 131/91 (!) 122/95 128/75 (!) 122/93  Pulse: 92 81 67 99  Resp: (!) '22 20 19 '$ (!)  22  Temp:      SpO2: 100% 100% 99% 100%  Weight:      Height:        Physical Exam Constitutional:      General: She is not in acute distress.    Appearance: Normal appearance.  HENT:     Head: Normocephalic and atraumatic.     Mouth/Throat:     Mouth: Mucous membranes are moist.     Pharynx: Oropharynx is clear.  Eyes:     Extraocular Movements: Extraocular movements intact.     Pupils: Pupils are equal, round, and reactive to light.  Cardiovascular:     Rate and Rhythm: Regular rhythm. Tachycardia present.     Pulses: Normal pulses.     Heart sounds: Normal heart sounds.  Pulmonary:     Effort: Pulmonary effort is normal. No respiratory distress.     Breath sounds: Normal breath sounds.  Abdominal:     General: Bowel sounds are normal. There is no distension.     Palpations: Abdomen is soft.     Tenderness: There is no abdominal tenderness.  Musculoskeletal:        General: No swelling or deformity.     Right lower leg: Edema present.     Left lower leg: Edema present.  Skin:    General: Skin is warm and dry.  Neurological:     General: No  focal deficit present.     Mental Status: Mental status is at baseline.    Labs on Admission: I have personally reviewed following labs and imaging studies  CBC: Recent Labs  Lab 05/02/2022 1335 05/02/2022 1354  WBC 13.9*  --   NEUTROABS 11.2*  --   HGB 10.2* 11.2*  HCT 34.1* 33.0*  MCV 91.4  --   PLT 367  --     Basic Metabolic Panel: Recent Labs  Lab 05/04/2022 1335 05/07/2022 1354  NA 137 136  K 4.2 4.1  CL 101 103  CO2 23  --   GLUCOSE 139* 133*  BUN 16 16  CREATININE 1.36* 1.30*  CALCIUM 8.5*  --     GFR: Estimated Creatinine Clearance: 19.8 mL/min (A) (by C-G formula based on SCr of 1.3 mg/dL (H)).  Liver Function Tests: Recent Labs  Lab 05/15/2022 1335  AST 35  ALT 11  ALKPHOS 91  BILITOT 1.0  PROT 7.3  ALBUMIN 2.7*    Urine analysis:    Component Value Date/Time   COLORURINE YELLOW 08/30/2021 2314   APPEARANCEUR CLOUDY (A) 08/30/2021 2314   LABSPEC 1.012 08/30/2021 2314   PHURINE 7.0 08/30/2021 2314   GLUCOSEU NEGATIVE 08/30/2021 2314   HGBUR SMALL (A) 08/30/2021 2314   BILIRUBINUR NEGATIVE 08/30/2021 2314   KETONESUR NEGATIVE 08/30/2021 2314   PROTEINUR 30 (A) 08/30/2021 2314   UROBILINOGEN 0.2 07/03/2014 0313   NITRITE NEGATIVE 08/30/2021 2314   LEUKOCYTESUR LARGE (A) 08/30/2021 2314    Radiological Exams on Admission: CT CHEST ABDOMEN PELVIS WO CONTRAST  Result Date: 05/12/2022 CLINICAL DATA:  Aortic aneurysm, known or suspected r/o dissection vs aneurysm. has IV contrast allergy EXAM: CT CHEST, ABDOMEN AND PELVIS WITHOUT CONTRAST TECHNIQUE: Multidetector CT imaging of the chest, abdomen and pelvis was performed following the standard protocol without IV contrast. RADIATION DOSE REDUCTION: This exam was performed according to the departmental dose-optimization program which includes automated exposure control, adjustment of the mA and/or kV according to patient size and/or use of iterative reconstruction technique. COMPARISON:  CT abdomen pelvis  08/31/2021 FINDINGS:  CT CHEST FINDINGS Cardiovascular: Cardiomegaly with markedly dilated right atrium.No pericardial disease.Extensive coronary artery calcifications.Aortic valve calcifications. Moderate atherosclerosis of the thoracic aorta. Lack intravascular contrast limits evaluation of the thoracic aorta. There is no evidence of intramural hematoma or aneurysm. Mediastinum/Nodes: No lymphadenopathy.The thyroid is unremarkable.Large hiatal hernia, see discussion below in the abdomen section.No pneumomediastinum. Lungs/Pleura: Severe bronchial wall thickening bilaterally.Interlobular septal thickening with ground-glass opacities bilaterally. Moderate bilateral pleural effusions increased from prior exam. Adjacent atelectasis.No pneumothorax. Musculoskeletal: No acute osseous abnormality.No suspicious osseous lesion. Osteopenia. Multilevel degenerative changes of the spine. CT ABDOMEN PELVIS FINDINGS Hepatobiliary: No focal liver abnormality is seen. There is distension of the gallbladder with mild wall thickening. Pancreas: Unremarkable. No pancreatic ductal dilatation or surrounding inflammatory changes. Spleen: Normal in size without focal abnormality. Adrenals/Urinary Tract: Adrenal glands are unremarkable. There is unchanged left-sided hydroureteronephrosis, due to extensive inflammation related to sigmoid diverticulitis. No obstructing stone identified. There is mild bilateral renal atrophy. There is bladder wall thickening with moderate distension, predominantly filled with gas. Stomach/Bowel: There is a large hiatal hernia with partial intrathoracic stomach. There is unchanged organo-axial rotation with increased distension since the prior exam but no evidence of gastric wall thickening or adjacent inflammation. Small bowel is decompressed. There is extensive colonic diverticulosis. There is similar severe sigmoid colon wall thickening with extensive adjacent inflammatory stranding, with adjacent soft  tissue thickening and gas and fluid, consistent with colovesical fistula. There is intervening gas/fluid collection measures approximately 6.8 x 2.9 cm, previously 6.8 2.5 cm, with increased gas in the collection in comparison to prior.Prior appendectomy, per medical record. Vascular/Lymphatic: Aortoiliac atherosclerosis. No AAA. No lymphadenopathy. Reproductive: Unremarkable. Other: Tiny fat containing umbilical hernia. Small fat containing inguinal hernias. No bowel containing hernia. Extensive pelvic inflammation. Generalized edematous appearance in the abdomen. Musculoskeletal: Moderate body wall edema, increased from prior. There is no acute osseous abnormality. No suspicious osseous lesion. IMPRESSION: Severe chronic sigmoid diverticulitis with colovesical fistula and unchanged associated cystitis and moderate left-sided hydroureteronephrosis. Large hiatal hernia with partially intrathoracic stomach. Similar organo-axial rotation but with markedly increased gastric distension in comparison to prior. No wall thickening or adjacent stranding to suggest an obstructing volvulus at this time. Recommend close clinical attention. Mild gallbladder distension with wall thickening, nonspecific. Moderate pulmonary edema with moderate-sized bilateral pleural effusions, increased on the right in comparison to prior. Unchanged cardiomegaly. Extensive atherosclerosis of the aorta without evidence of aneurysm or intramural hematoma. Lack of intravascular contrast limits evaluation for dissection. Electronically Signed   By: Maurine Simmering M.D.   On: 05/14/2022 17:37   DG Chest Port 1 View  Result Date: 05/20/2022 CLINICAL DATA:  Chest pain EXAM: PORTABLE CHEST 1 VIEW COMPARISON:  Chest x-ray dated January 04, 2022 FINDINGS: Unchanged cardiomegaly. Large lucency overlying the cardiac shadow. Mild bibasilar lung opacities, likely due to atelectasis. Possible trace bilateral pleural effusions. No large pleural effusion or evidence  of pneumothorax. IMPRESSION: Large lucency overlying the cardiac shadow, compatible with large hiatal hernia which appears increased in size when compared with prior exams. Given history of chest pain, gastric obstruction is a concern. Recommend contrast-enhanced CT of the chest, abdomen and pelvis for further evaluation. Electronically Signed   By: Yetta Glassman M.D.   On: 05/07/2022 13:08    EKG: Independently reviewed.  Atrial fibrillation at 109 bpm.  PVCs.  Nonspecific T wave flattening and inversion but no ST elevations.  Assessment/Plan Principal Problem:   ACS (acute coronary syndrome) (HCC) Active Problems:   Hyperlipidemia   Hypothyroid  Persistent atrial fibrillation (HCC)   Benign essential hypertension   Rheumatoid arthritis (HCC)   Neuropathic pain   Anemia   Anxiety   Polyneuropathy   AKI (acute kidney injury) ([place])   Chronic combined systolic and diastolic congestive heart failure ([place])   Essential (hemorrhagic) thrombocythemia (HCC)   ACS > Patient presenting with chest pain and did have ST elevations in the field but these improved on arrival to the ED. > Either way when evaluated by interventional cardiology patient declined catheterization. > Cardiology is following and agree with heparin and nitro drip in the setting of patient's declining of other interventions.  May need to consider a more palliative approach pending response and patient's interest in other procedures. > Troponin trend 1607, 4759, 8094.  A1c normal.  Low lipid panel normal other than HDL 40.  Is also received multiple doses of pain medication in the ED. - Monitor on progressive unit - Continue with nitro drip and heparin drip - Continue as needed pain control - Appreciate cardiology recommendations - Trend BMP and CBC - Continue home metoprolol - Echocardiogram - Continue to trend troponin to peak - Palliative care consult  AKI > Creatinine elevated to 1.36 and baseline of 1. -  Received some fluids in the ED.  Does have some edema in the setting of chronic CHF. - Trend renal function and electrolytes  Atrial fibrillation - Continue home metoprolol - Has declined anticoagulation in the past  Chronic systolic CHF  > Last echo 35-40% - Not currently on diuretics - We will be getting repeat echo as above - I's and O's, daily weights  Neuropathy - Continue home gabapentin  Anemia > Hemoglobin stable at 10.2 in ED - Trend CBC  Anxiety - Continue home Xanax  Hypothyroidism - Patient currently not taking Synthroid due to issues with her reflux per family report - Check TSH  Leukocytosis > Suspect likely reactive in the setting of ACS.  Could be component of her chronic diverticulitis - Continue to trend for now  DVT prophylaxis: Heparin Code Status:   DNR Family Communication:  Son updated by phone.  Disposition Plan:   Patient is from:  Home  Anticipated DC to:  Pending clinical course  Anticipated DC date:  Pending clinical course  Anticipated DC barriers: None  Consults called:  Cardiology Admission status:  Inpatient, progressive  Severity of Illness: The appropriate patient status for this patient is INPATIENT. Inpatient status is judged to be reasonable and necessary in order to provide the required intensity of service to ensure the patient's safety. The patient's presenting symptoms, physical exam findings, and initial radiographic and laboratory data in the context of their chronic comorbidities is felt to place them at high risk for further clinical deterioration. Furthermore, it is not anticipated that the patient will be medically stable for discharge from the hospital within 2 midnights of admission.   * I certify that at the point of admission it is my clinical judgment that the patient will require inpatient hospital care spanning beyond 2 midnights from the point of admission due to high intensity of service, high risk for further  deterioration and high frequency of surveillance required.Marcelyn Bruins MD [facility]  How to contact the [facility] Attending or Consulting provider [place] or covering provider during after hours [place], for this patient?   Check the care team in [facility] and look for a) attending/consulting [facility] provider listed and b) the [facility] team listed Log  into [email or web address] and use [place]'s universal password to access. If you do not have the password, please contact the hospital operator. Locate the [facility] provider you are looking for under [facility] and page to a number that you can be directly reached. If you still have difficulty reaching the provider, please page the [facility] (Director on Call) for the Hospitalists listed on amion for assistance.  05/05/2022, 8:53 PM

## 2022-05-11 NOTE — ED Notes (Signed)
Pt O2 saturation drops to 86% on room air when sleeping. Applied 2L O2 nasal cannula to maintain saturation while pt is resting. No acute distress noted.

## 2022-05-11 NOTE — Progress Notes (Signed)
Interventional Cardiology Note:   Code STEMI called by  EMS. EKG with lateral ST elevation and diffuse ST depression. Pt with onset of chest pain this am described as epigastric burning. I met her in the ED as she arrived and I explained that based on her symptoms and EKG changes she may be having a heart attack and that we could consider a cardiac cath. Given advanced age, she did not wish to proceed with emergent cardiac cath. She continues to have mild chest discomfort but appears comfortable. Discussed with ED staff who will begin workup in the ED. Of note, pt with severe AS and LV systolic dysfunction in 0737 when seen by Dr. Audie Box but she did not keep planned f/u with cardiology.   Lauree Chandler. 05/22/2022 12:30 PM

## 2022-05-11 NOTE — ED Notes (Signed)
Dr. Darl Householder notified of elevated trop

## 2022-05-11 NOTE — Consult Note (Addendum)
Cardiology Consultl:   Patient ID: [name] MRN: [id number]; DOB: Oct 31, 1926   Admission date: 05/16/2022  PCP:  [name], NP   [facility] Providers Cardiologist:  Evalina Field, MD        Chief Complaint:  Chest pain  Patient Profile:   [name] is a 88 y.o. female with hx severe AS, LVD, CHF, Afib, HTN, HLD, RA, who is being seen 05/17/2022 for the evaluation of Chest pain.  History of Present Illness:   [name] was seen in consult 2020 by Dr Audie Box. Echo at that time showed severe low-flow low gradient aortic stenosis.  The plan was to follow her closely in clinic and consider TAVR in the future.  At that time, she had impressive wall motion abnormalities in the LAD distribution, either LAD infarct or Takotsubo, LAD occlusion suspected.    Her troponins at that time are only mildly elevated, and her EKG did not show any prior infarction.  She was optimized on heart failure medications.  Her atrial fibrillation was rate controlled.  At that time, she did not want aggressive medical therapy or interventions.  She did not follow-up in clinic as planned.  Today, she had sudden onset of epigastric pain radiating to her back and shoulders.  She came to the ER where ECG was concerning for inferior STEMI.  The ECG was reviewed by Dr. Angelena Form and he discussed the situation with the patient.  She did not wish to proceed with emergent cardiac catheterization.  [name] woke up this morning with chest pain.  She states it was severe.  It was associated with nausea, no vomiting.  She says it was a 9-10/10.  She has never had it before.  She did not take any medications for it.  She has had baby aspirin x4 by EMS and sublingual nitroglycerin x1.  The pain is still a 9/10.  She has not been diaphoretic.  At times, the pain seems worse in her epigastric area.  This is supported by data from her chest x-ray.  At this time, she is still complaining  of severe pain and requests pain medication.  Her initial ECG by EMS was concerning for STEMI, but she did not wish a heart catheterization.  Her ECG now is improved.   Past Medical History:  Diagnosis Date   A-fib ([place])    Acute CHF (congestive heart failure) ([place]) 09/02/2019   Aortic stenosis, moderate    last echo in 2010; AVA 1.1; mild AS per echo in June 2013   Colovesical fistula    Diverticulitis    Edema of both legs    s/p laser treatment per Dr. Donnetta Hutching   GAD (generalized anxiety disorder) 05/20/2020   Hiatal hernia    History of chicken pox    HLD (hyperlipidemia)    Hypertension 04/19/2012   Hypothyroidism    IBS (irritable bowel syndrome)    Iron deficiency anemia 09/19/2017   and 02/22/2020   Localized edema 06/21/2012   Overview:  Vascular surgeon - Dr Donnetta Hutching   Neuropathic pain 09/06/2015   Rheumatoid arthritis ([place])     Past Surgical History:  Procedure Laterality Date   APPENDECTOMY  1955   CARPAL TUNNEL RELEASE Right    CATARACT EXTRACTION     ENDOSCOPIC VEIN LASER TREATMENT     ENDOVENOUS ABLATION SAPHENOUS VEIN W/ LASER  08-29-2012   left greater saphenous vein   Curt Jews MD   ENDOVENOUS ABLATION SAPHENOUS VEIN W/  LASER  09-19-2012   right greater saphenous vein by Curt Jews MD   EYE SURGERY  2005   Bilateral cataract   FOOT SURGERY  2003   bilateral Hammer Toe   stab phlebectomy Right 01-02-2013   10-15 incisions right thigh and calf by Curt Jews MD     Medications Prior to Admission: Prior to Admission medications   Medication Sig Start Date End Date Taking? Authorizing Provider  acetaminophen (TYLENOL) 500 MG tablet Take 1,000 mg by mouth every 8 (eight) hours as needed for moderate pain (joint pain).     [provider]  ALPRAZolam Duanne Moron) 0.5 MG tablet Take 1 tablet (0.5 mg total) by mouth at bedtime. 04/03/22   Libby Maw, MD  aspirin 81 MG chewable tablet Chew 1 tablet (81 mg total) by mouth daily. Patient not taking:  Reported on 04/03/2022 10/26/18   Hennie Duos, MD  fosfomycin (MONUROL) 3 g PACK Take by mouth. 10/25/21   [provider]  furosemide (LASIX) 20 MG tablet Take 1 tablet (20 mg total) by mouth daily. 09/12/21   Nche, Charlene Brooke, NP  gabapentin (NEURONTIN) 100 MG capsule Take 1 capsule (100 mg total) by mouth daily. 09/12/21   Nche, Charlene Brooke, NP  levothyroxine (SYNTHROID) 50 MCG tablet Take 1 tablet (50 mcg total) by mouth daily before breakfast. Patient not taking: Reported on 04/03/2022 09/15/21   Nche, Charlene Brooke, NP  loteprednol (LOTEMAX) 0.5 % ophthalmic suspension SMARTSIG:In Eye(s) 03/01/22   [provider]  metoprolol succinate (TOPROL-XL) 25 MG 24 hr tablet Take 3 tablets (75 mg total) by mouth daily. 01/04/22   Nche, Charlene Brooke, NP     Allergies:    Allergies  Allergen Reactions   Augmentin [Amoxicillin-Pot Clavulanate] Nausea Only and Other (See Comments)    CAUSED EXTREME NAUSEA (patient is physically unable to vomit due to the fact part of her intestines have been removed)   Infliximab Other (See Comments)    REACTION: "Enlarged intestines and hernia. Also pushed intestines on lower part of lungs."    Aspirin Nausea And Vomiting and Other (See Comments)    Low Dose is ok- Stomach problems   Carvedilol Other (See Comments)    "Fatigue/ ill"   Codeine Nausea And Vomiting and Other (See Comments)    "Deathly Sick"   Contrast Media [Iodinated Contrast Media] Other (See Comments)    All-over body pain   Other Nausea Only and Other (See Comments)    Most antibiotics cause the patient to feel "deathly sick" and she "cannot vomit" because part of her intestines have been removed.   Penicillins Rash    Has patient had a PCN reaction causing immediate rash, facial/tongue/throat swelling, SOB or lightheadedness with hypotension: Y Has patient had a PCN reaction causing severe rash involving mucus membranes or skin necrosis: Y Has patient had a PCN  reaction that required hospitalization: N Has patient had a PCN reaction occurring within the last 10 years: N If all of the above answers are "NO", then may proceed with Cephalosporin use.     Social History:   Social History   Socioeconomic History   Marital status: Widowed    Spouse name: Not on file   Number of children: 2   Years of education: Not on file   Highest education level: Not on file  Occupational History   Occupation: retired  Tobacco Use   Smoking status: Former    Types: Cigarettes    Quit date:  10/31/1983    Years since quitting: 38.5   Smokeless tobacco: Never  Vaping Use   Vaping Use: Never used  Substance and Sexual Activity   Alcohol use: No   Drug use: No   Sexual activity: Never  Other Topics Concern   Not on file  Social History Narrative   Son lives with patient.   Social Determinants of Health   Financial Resource Strain: Low Risk  (11/30/2021)   Overall Financial Resource Strain (CARDIA)    Difficulty of Paying Living Expenses: Not hard at all  Food Insecurity: No Food Insecurity (11/30/2021)   Hunger Vital Sign    Worried About Running Out of Food in the Last Year: Never true    Ran Out of Food in the Last Year: Never true  Transportation Needs: No Transportation Needs (11/30/2021)   PRAPARE - Hydrologist (Medical): No    Lack of Transportation (Non-Medical): No  Physical Activity: Inactive (11/30/2021)   Exercise Vital Sign    Days of Exercise per Week: 0 days    Minutes of Exercise per Session: 0 min  Stress: No Stress Concern Present (11/30/2021)   [place]    Feeling of Stress : Not at all  Social Connections: Moderately Isolated (11/30/2021)   Social Connection and Isolation Panel [NHANES]    Frequency of Communication with Friends and Family: More than three times a week    Frequency of Social Gatherings with Friends and Family: Once a week     Attends Religious Services: 1 to 4 times per year    Active Member of [employer] or Organizations: No    Attends Archivist Meetings: Never    Marital Status: Widowed  Intimate Partner Violence: Not At Risk (11/30/2021)   Humiliation, Afraid, Rape, and Kick questionnaire    Fear of Current or Ex-Partner: No    Emotionally Abused: No    Physically Abused: No    Sexually Abused: No    Family History:  The patient's family history includes COPD in her father; Heart attack in her brother; Heart disease in her father and mother; Heart disease (age of onset: 59) in her brother; Hypertension in her sister; Peripheral vascular disease in her father, mother, and another family member.    ROS:  Please see the history of present illness. All other ROS reviewed and negative.     Physical Exam/Data:   Vitals:   05/19/2022 1230 05/23/2022 1315 05/18/2022 1330 05/09/2022 1345  BP: 115/82 (!) 130/94 (!) 126/95   Pulse: 88 (!) 111 95 92  Resp: (!) 25 (!) 22 (!) 23 20  Temp:      SpO2: 96% 95% 97% 100%  Weight:      Height:       No intake or output data in the 24 hours ending 05/03/2022 1417    05/09/2022   12:15 PM 04/03/2022    2:38 PM 01/04/2022    4:13 AM  Last 3 Weights  Weight (lbs) 110 lb 14.3 oz 110 lb 12.8 oz 113 lb  Weight (kg) 50.3 kg 50.259 kg 51.256 kg     Body mass index is 21.66 kg/m.  General: Frail elderly female, in acute distress HEENT: normal Neck: + JVD Vascular: No carotid bruits; Distal pulses 2+ bilaterally   Cardiac:  normal S1, S2; irregular rate and rhythm; 2/6 murmur  Lungs: Rales right base, no wheezing, rhonchi or rales  Abd: soft, nontender,  no hepatomegaly  Ext: no edema Musculoskeletal:  No deformities, BUE and BLE strength weak but equal Skin: warm and dry  Neuro:  CNs 2-12 intact, no focal abnormalities noted Psych:  Normal affect    EKG:  The ECG that was done today in the ER, was personally reviewed and demonstrates atrial fib, HR 109 EMS ECG is atrial  fib, HR 111, anterior ST elevation and inferolateral ST depression   Relevant CV Studies:  LIMITED ECHO: 09/05/2019  1. Left ventricular ejection fraction, by visual estimation, is 35 to  40%. The left ventricle has moderately decreased function. There is mildly increased left ventricular hypertrophy.   2. Mid and apical anterior wall, mid and apical anterior septum, and apex are abnormal.   3. No apical thrombus seen   4. Definity contrast agent was given IV to delineate the left ventricular endocardial borders.   ECHO: 09/03/2019  1. Left ventricular ejection fraction, by visual estimation, is 35-40%.  There is mildly increased left ventricular hypertrophy of the basal  septum.   2. There is akinesis of the apical septal, apical, apical inferior , mid  anteroseptal and apical anterior walls.   3. Left ventricular diastolic function could not be evaluated due to  underlying atrial fibrillation.   4. Elevated left ventricular end-diastolic pressure.   5. Global right ventricle has mildly reduced systolic function.The right ventricular size is normal. No increase in right ventricular wall  thickness.   6. Left atrial size was moderately dilated.   7. Right atrial size was moderately dilated.   8. The mitral valve is normal in structure. Trace mitral valve  regurgitation. No evidence of mitral stenosis.   9. The tricuspid valve is normal in structure. Tricuspid valve  regurgitation is mild.  10. The aortic valve is tricuspid. There is Severely thickening of the  aortic valve. There is Severe calcifcation of the aortic valve. Aortic  valve regurgitation is trivial. No evidence of aortic valve sclerosis or  stenosis. The peak velocity and mean gradient are c/w mild AS but these are likely underestimated in the setting of severe LV dysfunction. The AVA is calculated at 0.42 cm and dimensionless index 0.27. Visually valve appears at least moderate to  severly stenotic.  11. The pulmonic  valve was normal in structure. Pulmonic valve  regurgitation is trivial.  12. Mildly elevated pulmonary artery systolic pressure.  13. The inferior vena cava is normal in size with greater than 50%  respiratory variability, suggesting right atrial pressure of 3 mmHg.  14. Trivial pericardial effusion is present.  15. The pericardial effusion is localized near the right atrium.  16. Cannot rule out apical thrombus. Recommend limited study with definity contrast.   Laboratory Data:  High Sensitivity Troponin:  No results for input(s): "TROPONINIHS" in the last 720 hours.    Chemistry Recent Labs  Lab 05/08/2022 1354  NA 136  K 4.1  CL 103  GLUCOSE 133*  BUN 16  CREATININE 1.30*    No results for input(s): "PROT", "ALBUMIN", "AST", "ALT", "ALKPHOS", "BILITOT" in the last 168 hours. Lipids No results for input(s): "CHOL", "TRIG", "HDL", "LABVLDL", "LDLCALC", "CHOLHDL" in the last 168 hours. Hematology Recent Labs  Lab 05/02/2022 1335 05/21/2022 1354  WBC 13.9*  --   RBC 3.73*  --   HGB 10.2* 11.2*  HCT 34.1* 33.0*  MCV 91.4  --   MCH 27.3  --   MCHC 29.9*  --   RDW 15.9*  --   PLT 367  --  Thyroid No results for input(s): "TSH", "FREET4" in the last 168 hours. BNPNo results for input(s): "BNP", "PROBNP" in the last 168 hours.  DDimer No results for input(s): "DDIMER" in the last 168 hours.   Radiology/Studies:  DG Chest Port 1 View  Result Date: 05/08/2022 CLINICAL DATA:  Chest pain EXAM: PORTABLE CHEST 1 VIEW COMPARISON:  Chest x-ray dated January 04, 2022 FINDINGS: Unchanged cardiomegaly. Large lucency overlying the cardiac shadow. Mild bibasilar lung opacities, likely due to atelectasis. Possible trace bilateral pleural effusions. No large pleural effusion or evidence of pneumothorax. IMPRESSION: Large lucency overlying the cardiac shadow, compatible with large hiatal hernia which appears increased in size when compared with prior exams. Given history of chest pain, gastric  obstruction is a concern. Recommend contrast-enhanced CT of the chest, abdomen and pelvis for further evaluation. Electronically Signed   By: Yetta Glassman M.D.   On: 05/07/2022 13:08     Assessment and Plan:   Chest pain: -Initial problems getting blood draws, so enzymes are not back yet but have been ordered. -Pain was not improved by the aspirin and 1 sublingual nitroglycerin, but her ECG looks better - She did not wish to undergo cardiac catheterization, treat medically - I will get a stat echo, and start IV nitroglycerin - The ER has ordered pain medications for her, these are needed  2.  Abdomen normal chest x-ray - Large hiatal hernia with very large stomach bubble, increased from prior exams -Concern for gastric obstruction, this may be the cause of her pain. -Radiology recommends a CT with contrast to evaluate for this  3.  Permanent atrial fibrillation: - Xarelto was discontinued because of blood in her stool. -In 2016, she declined anticoagulation   Risk Assessment/Risk Scores:    TIMI Risk Score for Unstable Angina or Non-ST Elevation MI:   The patient's TIMI risk score is 7, which indicates a 41% risk of all cause mortality, new or recurrent myocardial infarction or need for urgent revascularization in the next 14 days.  New York Heart Association (NYHA) Functional Class NYHA Class II  CHA2DS2-VASc Score = 6   This indicates a 9.7% annual risk of stroke. The patient's score is based upon:  For questions or updates, please contact [place] Please consult [email or web address] for contact info under   Signed, Rosaria Ferries, PA-C  05/12/2022 2:17 PM   Patient seen and examined, note reviewed with the signed Advanced Practice Provider. I personally reviewed laboratory data, imaging studies and relevant notes. I independently examined the patient and formulated the important aspects of the plan. I have personally discussed the plan with the patient and/or family.  Comments or changes to the note/plan are indicated below.  Chest pain concern for STEMI - patient decline any invasive measure. Will get echo. Started on IV nitro agree with this. Continue statin. May need her home beta blocker restarted.  Abnormal Cxr - plan for CT chest   PAF - on rate limiting agents, declined anticoagulation.  Will continue to follow.  Berniece Salines DO, MS [facility] Attending Cardiologist [place]  [address] [place], [place] [address] [phone] Website: [email or web address]

## 2022-05-11 NOTE — Progress Notes (Signed)
ANTICOAGULATION CONSULT NOTE - Initial Consult  Pharmacy Consult for Heparin Indication: chest pain/ACS  Allergies  Allergen Reactions   Augmentin [Amoxicillin-Pot Clavulanate] Nausea Only and Other (See Comments)    CAUSED EXTREME NAUSEA (patient is physically unable to vomit due to the fact part of her intestines have been removed)   Infliximab Other (See Comments)    REACTION: "Enlarged intestines and hernia. Also pushed intestines on lower part of lungs."    Aspirin Nausea And Vomiting and Other (See Comments)    Low Dose is ok- Stomach problems   Carvedilol Other (See Comments)    "Fatigue/ ill"   Codeine Nausea And Vomiting and Other (See Comments)    "Deathly Sick"   Contrast Media [Iodinated Contrast Media] Other (See Comments)    All-over body pain   Other Nausea Only and Other (See Comments)    Most antibiotics cause the patient to feel "deathly sick" and she "cannot vomit" because part of her intestines have been removed.   Penicillins Rash    Has patient had a PCN reaction causing immediate rash, facial/tongue/throat swelling, SOB or lightheadedness with hypotension: Y Has patient had a PCN reaction causing severe rash involving mucus membranes or skin necrosis: Y Has patient had a PCN reaction that required hospitalization: N Has patient had a PCN reaction occurring within the last 10 years: N If all of the above answers are "NO", then may proceed with Cephalosporin use.     Patient Measurements: Height: 5' (152.4 cm) Weight: 50.3 kg (110 lb 14.3 oz) IBW/kg (Calculated) : 45.5 Heparin Dosing Weight: 50.3 kg  Vital Signs: Temp: 98.2 F (36.8 C) (07/13 1216) BP: 135/100 (07/13 1745) Pulse Rate: 100 (07/13 1745)  Labs: Recent Labs    05/16/2022 1232 05/16/2022 1335 05/09/2022 1354 05/15/2022 1455 05/15/2022 1636  HGB  --  10.2* 11.2*  --   --   HCT  --  34.1* 33.0*  --   --   PLT  --  367  --   --   --   APTT 81*  --   --   --   --   LABPROT >90.0*  --   --   15.2  --   INR >10.0*  --   --  1.2  --   CREATININE  --  1.36* 1.30*  --   --   TROPONINIHS  --  1,607*  --  4,759* 8,984*    Estimated Creatinine Clearance: 19.8 mL/min (A) (by C-G formula based on SCr of 1.3 mg/dL (H)).   Medical History: Past Medical History:  Diagnosis Date   A-fib ([place])    Acute CHF (congestive heart failure) ([place]) 09/02/2019   Aortic stenosis, moderate    last echo in 2010; AVA 1.1; mild AS per echo in June 2013   Colovesical fistula    Diverticulitis    Edema of both legs    s/p laser treatment per Dr. Donnetta Hutching   GAD (generalized anxiety disorder) 05/20/2020   Hiatal hernia    History of chicken pox    HLD (hyperlipidemia)    Hypertension 04/19/2012   Hypothyroidism    IBS (irritable bowel syndrome)    Iron deficiency anemia 09/19/2017   and 02/22/2020   Localized edema 06/21/2012   Overview:  Vascular surgeon - Dr Donnetta Hutching   Neuropathic pain 09/06/2015   Rheumatoid arthritis ([place])     Medications:  (Not in a hospital admission)  Scheduled:  Infusions:   sodium chloride 20 mL/hr at 05/21/2022  1410   nitroGLYCERIN 5 mcg/min (05/05/2022 1458)   PRN:   Assessment: 56 yof with a history of severe AS, LVD, CHF, Afib, HTN, HLD, RA. Patient initially activated as a Code Stemi refusing cath. Heparin per pharmacy consult placed for chest pain/ACS.  Patient is not on anticoagulation prior to arrival.  Hgb 11.2; plt 367 PT/INR initially too high to read but 15.2/1.2 on repeat  Goal of Therapy:  Heparin level 0.3-0.7 units/ml Monitor platelets by anticoagulation protocol: Yes   Plan:  No initial bolus per Cardiology Start heparin infusion at 600 units/hr Check anti-Xa level in 8 hours and daily while on heparin Continue to monitor H&H and platelets  Lorelei Pont, PharmD, BCPS 05/18/2022 5:58 PM ED Clinical Pharmacist -  [phone]

## 2022-05-11 NOTE — ED Notes (Signed)
Patient transported to CT 

## 2022-05-11 NOTE — ED Triage Notes (Signed)
Pt bib ems from home; around 0900 sudden onset epigastric pain,radiating to back, shoulders; pain only in chest now; hx afib, not on thinner; denies hx of same; EMS 12 lead shows inferior stemi; initial bp 136/90, last bp 98/70 after 1 nitro, pain went from 9 to an 8; 324 asa given pta; 20 ga LH; denies N/V; HR 80-100 afib, 97% RA, RR 16; pt a and o x 4

## 2022-05-11 NOTE — ED Provider Notes (Signed)
  Physical Exam  BP 123/90   Pulse 90   Temp 98.2 F (36.8 C)   Resp (!) 24   Ht 5' (1.524 m)   Wt 50.3 kg   SpO2 100%   BMI 21.66 kg/m   Physical Exam  Procedures  Procedures  ED Course / MDM    Medical Decision Making Care assumed at 3 pm.  Patient had a STEMI on arrival but refused cath.  Patient also had abnormal chest x-ray and signed out pending dissection study and troponins and admission  5 pm Patient states that she had contrast dye allergy.  She refused a dissection study.  We will change to a noncontrast CT chest abdomen pelvis  6:38 PM CT showed no obvious dissection.  Patient has known diverticulitis with colovesicular fistula.  Her initial INR was greater than 10 but repeat is actually normal.  Troponin continues to trend up from 1000 to 4000 to 8000.  I discussed with Dr. Grandville Silos from cardiology.  She recommend heparin drip without bolus given history of GI bleed.  I again talked to patient about cardiac catheterization and she does not want to be cath.  I asked about CODE STATUS and patient wants to be DNR but is agreeable to IV heparin and IV nitro.  CRITICAL CARE Performed by: Wandra Arthurs   Total critical care time: 30 minutes  Critical care time was exclusive of separately billable procedures and treating other patients.  Critical care was necessary to treat or prevent imminent or life-threatening deterioration.  Critical care was time spent personally by me on the following activities: development of treatment plan with patient and/or surrogate as well as nursing, discussions with consultants, evaluation of patient's response to treatment, examination of patient, obtaining history from patient or surrogate, ordering and performing treatments and interventions, ordering and review of laboratory studies, ordering and review of radiographic studies, pulse oximetry and re-evaluation of patient's condition.   Problems Addressed: Acute coronary syndrome [facility]):  acute illness or injury Chest pain, unspecified type: acute illness or injury Diverticulitis: chronic illness or injury Hiatal hernia: chronic illness or injury ST elevation myocardial infarction (STEMI), unspecified artery ([place]): acute illness or injury  Amount and/or Complexity of Data Reviewed Labs: ordered. Decision-making details documented in ED Course. Radiology: ordered and independent interpretation performed. Decision-making details documented in ED Course. ECG/medicine tests: ordered and independent interpretation performed. Decision-making details documented in ED Course.  Risk Prescription drug management. Decision regarding hospitalization.          Drenda Freeze, MD 05/23/2022 [phone]

## 2022-05-11 NOTE — ED Provider Notes (Signed)
[facility] EMERGENCY DEPARTMENT Provider Note   CSN: [id number] Arrival date & time: 05/29/2022  1209     History  Chief Complaint  Patient presents with   Code STEMI    [name] is a 88 y.o. female.  [facility]ing to the [facility] apartment due to concern for chest pain.  Yesterday no symptoms.  Today woke up with severe chest pain, upper abdominal pain.  Does seem to come and go, at times more chest and at times more in her upper abdomen.  At times radiates to her back.  Currently pain is moderate to severe, throbbing.  No associated difficulty in breathing.  Patient has prior history of aortic stenosis, heart failure with reduced ejection fraction.  Patient did not follow-up with cardiology for further consideration of TAVR back in 2021 the severe AS was diagnosed. HPI     Home Medications Prior to Admission medications   Medication Sig Start Date End Date Taking? Authorizing Provider  acetaminophen (TYLENOL) 500 MG tablet Take 1,000 mg by mouth every 8 (eight) hours as needed for moderate pain (joint pain).     [provider]  ALPRAZolam Duanne Moron) 0.5 MG tablet Take 1 tablet (0.5 mg total) by mouth at bedtime. 04/03/22   Libby Maw, MD  aspirin 81 MG chewable tablet Chew 1 tablet (81 mg total) by mouth daily. Patient not taking: Reported on 04/03/2022 10/26/18   Hennie Duos, MD  fosfomycin (MONUROL) 3 g PACK Take by mouth. 10/25/21   [provider]  furosemide (LASIX) 20 MG tablet Take 1 tablet (20 mg total) by mouth daily. 09/12/21   Nche, Charlene Brooke, NP  gabapentin (NEURONTIN) 100 MG capsule Take 1 capsule (100 mg total) by mouth daily. 09/12/21   Nche, Charlene Brooke, NP  levothyroxine (SYNTHROID) 50 MCG tablet Take 1 tablet (50 mcg total) by mouth daily before breakfast. Patient not taking: Reported on 04/03/2022 09/15/21   Nche, Charlene Brooke, NP  loteprednol (LOTEMAX) 0.5 % ophthalmic suspension SMARTSIG:In Eye(s)  03/01/22   [provider]  metoprolol succinate (TOPROL-XL) 25 MG 24 hr tablet Take 3 tablets (75 mg total) by mouth daily. 01/04/22   Nche, Charlene Brooke, NP      Allergies    Augmentin [amoxicillin-pot clavulanate], Infliximab, Aspirin, Carvedilol, Codeine, Contrast media [iodinated contrast media], Other, and Penicillins    Review of Systems   Review of Systems  Constitutional:  Negative for chills and fever.  HENT:  Negative for ear pain and sore throat.   Eyes:  Negative for pain and visual disturbance.  Respiratory:  Negative for cough and shortness of breath.   Cardiovascular:  Positive for chest pain. Negative for palpitations.  Gastrointestinal:  Positive for abdominal pain and nausea. Negative for vomiting.  Genitourinary:  Negative for dysuria and hematuria.  Musculoskeletal:  Positive for back pain. Negative for arthralgias.  Skin:  Negative for color change and rash.  Neurological:  Negative for seizures and syncope.  All other systems reviewed and are negative.   Physical Exam Updated Vital Signs BP 112/69   Pulse (!) 107   Temp 98.2 F (36.8 C)   Resp 19   Ht 5' (1.524 m)   Wt 50.3 kg   SpO2 98%   BMI 21.66 kg/m  Physical Exam Vitals and nursing note reviewed.  Constitutional:      General: She is not in acute distress.    Appearance: She is well-developed.  HENT:     Head: Normocephalic  and atraumatic.  Eyes:     Conjunctiva/sclera: Conjunctivae normal.  Cardiovascular:     Rate and Rhythm: Normal rate and regular rhythm.     Pulses: Normal pulses.  Pulmonary:     Effort: Pulmonary effort is normal. No respiratory distress.     Breath sounds: Normal breath sounds.  Abdominal:     Palpations: Abdomen is soft.     Tenderness: There is abdominal tenderness.     Comments: There is some epigastric tenderness but no rebound or guarding  Musculoskeletal:        General: No swelling.     Cervical back: Neck supple.  Skin:    General: Skin is warm  and dry.     Capillary Refill: Capillary refill takes less than 2 seconds.  Neurological:     Mental Status: She is alert.  Psychiatric:        Mood and Affect: Mood normal.     ED Results / Procedures / Treatments   Labs (all labs ordered are listed, but only abnormal results are displayed) Labs Reviewed  APTT - Abnormal; Notable for the following components:      Result Value   aPTT 81 (*)    All other components within normal limits  CBC WITH DIFFERENTIAL/PLATELET - Abnormal; Notable for the following components:   WBC 13.9 (*)    RBC 3.73 (*)    Hemoglobin 10.2 (*)    HCT 34.1 (*)    MCHC 29.9 (*)    RDW 15.9 (*)    Neutro Abs 11.2 (*)    All other components within normal limits  COMPREHENSIVE METABOLIC PANEL - Abnormal; Notable for the following components:   Glucose, Bld 139 (*)    Creatinine, Ser 1.36 (*)    Calcium 8.5 (*)    Albumin 2.7 (*)    GFR, Estimated 37 (*)    All other components within normal limits  LIPID PANEL - Abnormal; Notable for the following components:   HDL 40 (*)    All other components within normal limits  PROTIME-INR - Abnormal; Notable for the following components:   Prothrombin Time >90.0 (*)    INR >10.0 (*)    All other components within normal limits  I-STAT CHEM 8, ED - Abnormal; Notable for the following components:   Creatinine, Ser 1.30 (*)    Glucose, Bld 133 (*)    Calcium, Ion 1.03 (*)    Hemoglobin 11.2 (*)    HCT 33.0 (*)    All other components within normal limits  TROPONIN I (HIGH SENSITIVITY) - Abnormal; Notable for the following components:   Troponin I (High Sensitivity) 1,607 (*)    All other components within normal limits  HEMOGLOBIN A1C  CBC WITH DIFFERENTIAL/PLATELET  CBC WITH DIFFERENTIAL/PLATELET  COMPREHENSIVE METABOLIC PANEL  PROTIME-INR  TROPONIN I (HIGH SENSITIVITY)  TROPONIN I (HIGH SENSITIVITY)  TROPONIN I (HIGH SENSITIVITY)    EKG EKG Interpretation  Date/Time:  Thursday May 11 2022  12:18:55 EDT Ventricular Rate:  109 PR Interval:    QRS Duration: 82 QT Interval:  358 QTC Calculation: 467 R Axis:   34 Text Interpretation: Atrial fibrillation Ventricular bigeminy Anteroseptal infarct, age indeterminate Confirmed by Madalyn Rob ([id number]) on 05/05/2022 12:29:24 PM  Radiology DG Chest Port 1 View  Result Date: 05/16/2022 CLINICAL DATA:  Chest pain EXAM: PORTABLE CHEST 1 VIEW COMPARISON:  Chest x-ray dated January 04, 2022 FINDINGS: Unchanged cardiomegaly. Large lucency overlying the cardiac shadow. Mild bibasilar lung opacities, likely due  to atelectasis. Possible trace bilateral pleural effusions. No large pleural effusion or evidence of pneumothorax. IMPRESSION: Large lucency overlying the cardiac shadow, compatible with large hiatal hernia which appears increased in size when compared with prior exams. Given history of chest pain, gastric obstruction is a concern. Recommend contrast-enhanced CT of the chest, abdomen and pelvis for further evaluation. Electronically Signed   By: Yetta Glassman M.D.   On: 05/08/2022 13:08    Procedures .Critical Care  Performed by: Lucrezia Starch, MD Authorized by: Lucrezia Starch, MD   Critical care provider statement:    Critical care time (minutes):  38   Critical care was necessary to treat or prevent imminent or life-threatening deterioration of the following conditions:  Cardiac failure   Critical care was time spent personally by me on the following activities:  Development of treatment plan with patient or surrogate, discussions with consultants, evaluation of patient's response to treatment, examination of patient, ordering and review of laboratory studies, ordering and review of radiographic studies, ordering and performing treatments and interventions, pulse oximetry, re-evaluation of patient's condition and review of old charts     Medications Ordered in ED Medications  0.9 %  sodium chloride infusion ( Intravenous  New Bag/Given 05/01/2022 1410)  nitroGLYCERIN 50 mg in dextrose 5 % 250 mL (0.2 mg/mL) infusion (5 mcg/min Intravenous New Bag/Given 05/21/2022 1458)  fentaNYL (SUBLIMAZE) injection 50 mcg (50 mcg Intravenous Given 05/09/2022 1252)  sodium chloride 0.9 % bolus 500 mL (0 mLs Intravenous Stopped 05/09/2022 1418)  ondansetron (ZOFRAN) injection 4 mg (4 mg Intravenous Given 05/10/2022 1410)  HYDROmorphone (DILAUDID) injection 0.5 mg (0.5 mg Intravenous Given 05/10/2022 1411)    ED Course/ Medical Decision Making/ A&P                           Medical Decision Making Amount and/or Complexity of Data Reviewed Labs: ordered. Radiology: ordered.  Risk Prescription drug management.   88 year old lady presented to ER as code STEMI.  Per EMS initial EKG did not meet STEMI criteria however repeat EKG was more concerning.  Upon arrival to ER, Dr. Angelena Form evaluated patient, reviewed EKGs.  EKGs were concerning for possible ischemia and he offered cardiac catheterization however patient was very clear that she did not want this sort of invasive procedure completed.  I further discussed with patient and she confirmed to me she does not want invasive procedures or surgeries.  Her repeat EKG here does not have significant ST depression or ST elevations, improved from comparison to EKG by EMS.  CXR reviewed, large hiatal hernia increased from prior.  Radiologist recommending CT chest abdomen pelvis for further evaluation.  Given patient having chest and upper abdominal pain radiating to back and recommendations from radiology, will complete CT chest abdomen pelvis as angiogram to evaluate for acute aortic pathology as well.  Lab work reviewed, mild leukocytosis noted, no AKI, creatinine at baseline.  INR is profoundly elevated, will send repeat for now, patient not on Coumadin and did not have known liver failure.  The general cardiology service has consulted on patient, they are recommending medicine admission.  Her troponin is  quite elevated which would be consistent with acute coronary syndrome.  I still believe she would benefit from getting the CT scan.  While awaiting CT results, repeat troponin, repeat PT/INR, signed out to Dr. Darl Householder.  Anticipate medicine admission most likely.           Final Clinical  Impression(s) / ED Diagnoses Final diagnoses:  Acute coronary syndrome ([place])  Chest pain, unspecified type  Hiatal hernia    Rx / DC Orders ED Discharge Orders     None         Lucrezia Starch, MD 05/16/2022 1521

## 2022-05-11 NOTE — ED Notes (Signed)
Provided pt with water for PO fluid challenge 

## 2022-05-12 ENCOUNTER — Inpatient Hospital Stay (HOSPITAL_COMMUNITY): Payer: Medicare Other

## 2022-05-12 DIAGNOSIS — N179 Acute kidney failure, unspecified: Secondary | ICD-10-CM | POA: Diagnosis not present

## 2022-05-12 DIAGNOSIS — Z515 Encounter for palliative care: Secondary | ICD-10-CM | POA: Diagnosis not present

## 2022-05-12 DIAGNOSIS — R9431 Abnormal electrocardiogram [ECG] [EKG]: Secondary | ICD-10-CM | POA: Diagnosis not present

## 2022-05-12 DIAGNOSIS — I249 Acute ischemic heart disease, unspecified: Secondary | ICD-10-CM | POA: Diagnosis not present

## 2022-05-12 DIAGNOSIS — R079 Chest pain, unspecified: Secondary | ICD-10-CM

## 2022-05-12 DIAGNOSIS — K5792 Diverticulitis of intestine, part unspecified, without perforation or abscess without bleeding: Secondary | ICD-10-CM | POA: Diagnosis not present

## 2022-05-12 DIAGNOSIS — Z7189 Other specified counseling: Secondary | ICD-10-CM

## 2022-05-12 LAB — HEPARIN LEVEL (UNFRACTIONATED)
Heparin Unfractionated: <0.1 IU/mL — ABNORMAL LOW (ref 0.30–0.70)
Heparin Unfractionated: <0.1 IU/mL — ABNORMAL LOW (ref 0.30–0.70)

## 2022-05-12 LAB — ECHOCARDIOGRAM COMPLETE
AR max vel: 2.05 cm2
AV Area VTI: 1.79 cm2
AV Area mean vel: 1.91 cm2
AV Mean grad: 10.7 mmHg
AV Peak grad: 18.2 mmHg
Ao pk vel: 2.13 m/s
Area-P 1/2: 4.85 cm2
Calc EF: 28.1 %
Height: 60 in
MV M vel: 4.5 m/s
MV Peak grad: 81 mmHg
MV VTI: 5.1 cm2
Radius: 0.45 cm
S' Lateral: 2.75 cm
Single Plane A2C EF: 35.5 %
Single Plane A4C EF: 19.2 %
Weight: 2045.87 oz

## 2022-05-12 LAB — BASIC METABOLIC PANEL
Anion gap: 12 (ref 5–15)
BUN: 20 mg/dL (ref 8–23)
CO2: 23 mmol/L (ref 22–32)
Calcium: 8.5 mg/dL — ABNORMAL LOW (ref 8.9–10.3)
Chloride: 101 mmol/L (ref 98–111)
Creatinine, Ser: 1.74 mg/dL — ABNORMAL HIGH (ref 0.44–1.00)
GFR, Estimated: 27 mL/min — ABNORMAL LOW (ref >60)
Glucose, Bld: 156 mg/dL — ABNORMAL HIGH (ref 70–99)
Potassium: 5.4 mmol/L — ABNORMAL HIGH (ref 3.5–5.1)
Sodium: 136 mmol/L (ref 135–145)

## 2022-05-12 LAB — CBC
HCT: 34 % — ABNORMAL LOW (ref 36.0–46.0)
Hemoglobin: 10.4 g/dL — ABNORMAL LOW (ref 12.0–15.0)
MCH: 26.8 pg (ref 26.0–34.0)
MCHC: 30.6 g/dL (ref 30.0–36.0)
MCV: 87.6 fL (ref 80.0–100.0)
Platelets: 343 10*3/uL (ref 150–400)
RBC: 3.88 MIL/uL (ref 3.87–5.11)
RDW: 16 % — ABNORMAL HIGH (ref 11.5–15.5)
WBC: 10.9 10*3/uL — ABNORMAL HIGH (ref 4.0–10.5)
nRBC: 0 % (ref 0.0–0.2)

## 2022-05-12 LAB — MAGNESIUM: Magnesium: 1.8 mg/dL (ref 1.7–2.4)

## 2022-05-12 MED ORDER — PERFLUTREN LIPID MICROSPHERE
1.0000 mL | INTRAVENOUS | Status: AC | PRN
  Administered 2022-05-12: 2 mL via INTRAVENOUS

## 2022-05-12 MED ORDER — POLYVINYL ALCOHOL 1.4 % OP SOLN: 1.0000 [drp] | Freq: Four times a day (QID) | OPHTHALMIC | Status: DC | PRN

## 2022-05-12 MED ORDER — HALOPERIDOL LACTATE 2 MG/ML PO CONC: 0.5000 mg | ORAL | Status: DC | PRN

## 2022-05-12 MED ORDER — NITROGLYCERIN IN D5W 200-5 MCG/ML-% IV SOLN
0.0000 ug/min | INTRAVENOUS | Status: DC
  Administered 2022-05-12: 5 ug/min via INTRAVENOUS
  Filled 2022-05-12: qty 250

## 2022-05-12 MED ORDER — BIOTENE DRY MOUTH MT LIQD: 15.0000 mL | OROMUCOSAL | Status: DC | PRN

## 2022-05-12 MED ORDER — AMIODARONE LOAD VIA INFUSION
150.0000 mg | Freq: Once | INTRAVENOUS | Status: AC
  Administered 2022-05-12: 150 mg via INTRAVENOUS
  Filled 2022-05-12: qty 83.34

## 2022-05-12 MED ORDER — GLYCOPYRROLATE 1 MG PO TABS: 1.0000 mg | ORAL_TABLET | ORAL | Status: DC | PRN

## 2022-05-12 MED ORDER — HALOPERIDOL 0.5 MG PO TABS: 0.5000 mg | ORAL_TABLET | ORAL | Status: DC | PRN

## 2022-05-12 MED ORDER — SODIUM POLYSTYRENE SULFONATE 15 GM/60ML PO SUSP
15.0000 g | Freq: Once | ORAL | Status: AC
  Administered 2022-05-12: 15 g via ORAL
  Filled 2022-05-12: qty 60

## 2022-05-12 MED ORDER — HYDROMORPHONE BOLUS VIA INFUSION: 1.0000 mg | INTRAVENOUS | Status: DC | PRN

## 2022-05-12 MED ORDER — AMIODARONE HCL IN DEXTROSE 360-4.14 MG/200ML-% IV SOLN
60.0000 mg/h | INTRAVENOUS | Status: DC
  Administered 2022-05-12 (×2): 60 mg/h via INTRAVENOUS
  Filled 2022-05-12 (×2): qty 200

## 2022-05-12 MED ORDER — SODIUM CHLORIDE 0.9 % IV SOLN
1.0000 mg/h | INTRAVENOUS | Status: DC
  Administered 2022-05-12: 1 mg/h via INTRAVENOUS
  Filled 2022-05-12: qty 5

## 2022-05-12 MED ORDER — GLYCOPYRROLATE 0.2 MG/ML IJ SOLN: 0.2000 mg | INTRAMUSCULAR | Status: DC | PRN

## 2022-05-12 MED ORDER — LORAZEPAM 2 MG/ML IJ SOLN: 1.0000 mg | INTRAMUSCULAR | Status: DC | PRN

## 2022-05-12 MED ORDER — HALOPERIDOL LACTATE 5 MG/ML IJ SOLN: 0.5000 mg | INTRAMUSCULAR | Status: DC | PRN

## 2022-05-12 MED ORDER — AMIODARONE HCL IN DEXTROSE 360-4.14 MG/200ML-% IV SOLN: 30.0000 mg/h | INTRAVENOUS | Status: DC

## 2022-05-12 NOTE — Progress Notes (Signed)
  Echocardiogram 2D Echocardiogram has been performed.  [name] 05/12/2022, 11:25 AM

## 2022-05-12 NOTE — Progress Notes (Signed)
Patient comfort care. Continuous Dilaudid drip running. All other drips discontinued. Patient is resting peacefully. Apneic spells noted along with breathing changes. Son [name] was called at home, as requested if there had been a noted change.

## 2022-05-12 NOTE — Progress Notes (Signed)
PROGRESS NOTE    [name]  DDU:[id number] DOB: February 05, 1927 DOA: 05/21/2022 PCP: [name], NP   Brief Narrative:  HPI: [name] is a 88 y.o. female with medical history significant of atrial fibrillation, CHF, aortic stenosis, hyperlipidemia, hypertension, hypothyroidism, RA, neuropathy, anemia, anxiety, essential thrombocythemia presenting with chest pain and presenting as STEMI.  Patient initially presented due to chest pain as a code STEMI with ST elevations in the field.  Patient was evaluated by interventional cardiology and patient declined catheterization.  Repeat EKG was improved.  She reports that she woke with severe chest pain and pain in her upper abdomen.  The pain has been waxing and waning today with intermittent radiation to her back.  In the ED pain described as a severe throbbing pain.  She denies fevers, chills, shortness of breath, constipation, diarrhea, nausea, vomiting.   ED Course: Vital signs in the ED significant for heart rate in the 90s to 100s, respiratory rate in the 20s, blood pressure in the 237S to 283T systolic.  Lab work-up included CMP with creatinine elevated 1.36 for baseline of 1, glucose 139, calcium 8.5, albumin 2.7.  CBC showed stable hemoglobin at 10.2, leukocytosis to 13.9.  PT and INR initially severely elevated however repeat showed this was an error and PT and INR were normal.  PTT elevated 81.  A1c normal, lipid panel normal except for HDL 40.  Troponin trend was 1607, 4759, 8984.  Patient chest x-ray showed changes consistent with large hiatal hernia increase in size recommending CT for further evaluation for obstruction.  CT of the chest and pelvis showed chronic diverticulitis, large hernia with increased gastric distention but no changes of obstruction, moderate pulmonary edema, moderate pleural effusion.  Patient received fentanyl, Dilaudid, nitro drip, heparin drip, Zofran, 500 cc bolus in the ED.  Cardiology was  consulted and agreed with heparin drip and nitro drip but patient had declined catheterization.  Cardiology will follow.  Assessment & Plan:   Principal Problem:   ACS (acute coronary syndrome) (HCC) Active Problems:   Hyperlipidemia   Hypothyroid   Persistent atrial fibrillation (HCC)   Benign essential hypertension   Rheumatoid arthritis (HCC)   Neuropathic pain   Anemia   Anxiety   Polyneuropathy   AKI (acute kidney injury) ([place])   Chronic combined systolic and diastolic congestive heart failure ([name])   Essential (hemorrhagic) thrombocythemia (HCC)  ACS/NSTEMI: > Patient presenting with chest pain and did have ST elevations in the field but these improved on arrival to the ED. > Either way when evaluated by interventional cardiology patient declined catheterization. > She was started on heparin, cardiology is following.  > Troponin trend 1607, [place], 8094.  A1c normal.  Low lipid panel normal other than HDL 40.  Echo just completed, results pending.  Acute pulmonary edema/acute on chronic systolic CHF: Managed by cardiology, 1 dose of Lasix ordered.   AKI on CKD stage IIIa: Patient's baseline creatinine appears to be anywhere from 1.15-1.36.  But now elevated at 1.74.  She has CHF findings on the chest x-ray and exam.  She is getting Lasix.  Repeat labs in the morning.  Permanent atrial fibrillation: Rates controlled. - Continue home metoprolol.  She had declined anticoagulation in the past but now on heparin.  Neuropathy - Continue home gabapentin  Anemia of chronic disease: > Hemoglobin stable.   Anxiety - Continue home Xanax  Hyperkalemia: 5.4.  Will order Kayexalate.  Repeat potassium later.  Monitor on telemetry.  Hypothyroidism -  Patient currently not taking Synthroid due to issues with her reflux per family report.  TSH elevated.  Patient appears to have paranoid behavior towards medications, keeps asking multiple questions about why each medication is being  given to her and despite of explanation, she finds it hard to comprehend.  I wonder if she has dementia.  We will readdress tomorrow.  Leukocytosis, nonspecific: Likely due to stress in the setting of ACS.  No infectious etiology.  Monitor.  ?  Dementia: Per my discussion with the son, she does have forgetfulness but she has never been tested for dementia.  Goal of care: Patient not in a position to have goal of care.  With her refusing many medications and previously refusing anticoagulation and now refusing cardiac cath, being 88 year old and DNR already, I will consult palliative care to help determine goal of care.  DVT prophylaxis:   Heparin drip   Code Status: DNR  Family Communication:  None present at bedside.  Discussed with son/[name] Glazier over the phone.  Status is: Inpatient Remains inpatient appropriate because: On heparin drip   Estimated body mass index is 24.97 kg/m as calculated from the following:   Height as of this encounter: 5' (1.524 m).   Weight as of this encounter: 58 kg.    Nutritional Assessment: Body mass index is 24.97 kg/m.Marland Kitchen Seen by dietician.  I agree with the assessment and plan as outlined below: Nutrition Status:        . Skin Assessment: I have examined the patient's skin and I agree with the wound assessment as performed by the wound care RN as outlined below:    Consultants:  Cardiology  Procedures:  None  Antimicrobials:  Anti-infectives (From admission, onward)    None         Subjective: Patient seen and examined.  She denied any chest pain.  She was fully alert and interestingly she was partially oriented.  I wonder if this is her baseline.  Objective: Vitals:   05/12/2022 2147 05/01/2022 2300 05/12/22 0453 05/12/22 0831  BP:  (!) 123/97 110/68 (!) 117/91  Pulse:  99  94  Resp:  (!) 21  18  Temp: 97.7 F (36.5 C)  97.6 F (36.4 C) 97.6 F (36.4 C)  TempSrc: Oral  Oral Oral  SpO2:  99%  99%  Weight: 58.1 kg  58  kg   Height: 5' (1.524 m)       Intake/Output Summary (Last 24 hours) at 05/12/2022 1210 Last data filed at 05/12/2022 0454 Gross per 24 hour  Intake 452.29 ml  Output 50 ml  Net 402.29 ml   Filed Weights   05/17/2022 1215 05/06/2022 2147 05/12/22 0453  Weight: 50.3 kg 58.1 kg 58 kg    Examination:  General exam: Appears calm and comfortable  Respiratory system: Clear to auscultation. Respiratory effort normal. Cardiovascular system: S1 & S2 heard, RRR. No JVD, murmurs, rubs, gallops or clicks. No pedal edema. Gastrointestinal system: Abdomen is nondistended, soft and nontender. No organomegaly or masses felt. Normal bowel sounds heard. Central nervous system: Alert and oriented x1. No focal neurological deficits. Extremities: Symmetric 5 x 5 power. Skin: No rashes, lesions or ulcers Psychiatry: Judgement and insight appear poor  Data Reviewed: I have personally reviewed following labs and imaging studies  CBC: Recent Labs  Lab 05/27/2022 1335 04/29/2022 1354 05/12/22 0211  WBC 13.9*  --  10.9*  NEUTROABS 11.2*  --   --   HGB 10.2* 11.2* 10.4*  HCT 34.1*  33.0* 34.0*  MCV 91.4  --  87.6  PLT 367  --  616   Basic Metabolic Panel: Recent Labs  Lab 05/08/2022 1335 05/12/2022 1354 04/29/2022 2106 05/12/22 0211  NA 137 136  --  136  K 4.2 4.1  --  5.4*  CL 101 103  --  101  CO2 23  --   --  23  GLUCOSE 139* 133*  --  156*  BUN 16 16  --  20  CREATININE 1.36* 1.30*  --  1.74*  CALCIUM 8.5*  --   --  8.5*  MG  --   --  1.8  --    GFR: Estimated Creatinine Clearance: 16.4 mL/min (A) (by C-G formula based on SCr of 1.74 mg/dL (H)). Liver Function Tests: Recent Labs  Lab 05/22/2022 1335  AST 35  ALT 11  ALKPHOS 91  BILITOT 1.0  PROT 7.3  ALBUMIN 2.7*   No results for input(s): "LIPASE", "AMYLASE" in the last 168 hours. No results for input(s): "AMMONIA" in the last 168 hours. Coagulation Profile: Recent Labs  Lab 05/04/2022 1232 05/08/2022 1455  INR >10.0* 1.2    Cardiac Enzymes: No results for input(s): "CKTOTAL", "CKMB", "CKMBINDEX", "TROPONINI" in the last 168 hours. BNP (last 3 results) Recent Labs    04/03/22 1525  PROBNP 780.0*   HbA1C: Recent Labs    05/06/2022 1335  HGBA1C 5.6   CBG: No results for input(s): "GLUCAP" in the last 168 hours. Lipid Profile: Recent Labs    05/15/2022 1335  CHOL 129  HDL 40*  LDLCALC 72  TRIG 84  CHOLHDL 3.2   Thyroid Function Tests: Recent Labs    05/20/2022 2134  TSH 7.866*   Anemia Panel: No results for input(s): "VITAMINB12", "FOLATE", "FERRITIN", "TIBC", "IRON", "RETICCTPCT" in the last 72 hours. Sepsis Labs: No results for input(s): "PROCALCITON", "LATICACIDVEN" in the last 168 hours.  No results found for this or any previous visit (from the past 240 hour(s)).   Radiology Studies: CT CHEST ABDOMEN PELVIS WO CONTRAST  Result Date: 05/09/2022 CLINICAL DATA:  Aortic aneurysm, known or suspected r/o dissection vs aneurysm. has IV contrast allergy EXAM: CT CHEST, ABDOMEN AND PELVIS WITHOUT CONTRAST TECHNIQUE: Multidetector CT imaging of the chest, abdomen and pelvis was performed following the standard protocol without IV contrast. RADIATION DOSE REDUCTION: This exam was performed according to the departmental dose-optimization program which includes automated exposure control, adjustment of the mA and/or kV according to patient size and/or use of iterative reconstruction technique. COMPARISON:  CT abdomen pelvis 08/31/2021 FINDINGS: CT CHEST FINDINGS Cardiovascular: Cardiomegaly with markedly dilated right atrium.No pericardial disease.Extensive coronary artery calcifications.Aortic valve calcifications. Moderate atherosclerosis of the thoracic aorta. Lack intravascular contrast limits evaluation of the thoracic aorta. There is no evidence of intramural hematoma or aneurysm. Mediastinum/Nodes: No lymphadenopathy.The thyroid is unremarkable.Large hiatal hernia, see discussion below in the abdomen  section.No pneumomediastinum. Lungs/Pleura: Severe bronchial wall thickening bilaterally.Interlobular septal thickening with ground-glass opacities bilaterally. Moderate bilateral pleural effusions increased from prior exam. Adjacent atelectasis.No pneumothorax. Musculoskeletal: No acute osseous abnormality.No suspicious osseous lesion. Osteopenia. Multilevel degenerative changes of the spine. CT ABDOMEN PELVIS FINDINGS Hepatobiliary: No focal liver abnormality is seen. There is distension of the gallbladder with mild wall thickening. Pancreas: Unremarkable. No pancreatic ductal dilatation or surrounding inflammatory changes. Spleen: Normal in size without focal abnormality. Adrenals/Urinary Tract: Adrenal glands are unremarkable. There is unchanged left-sided hydroureteronephrosis, due to extensive inflammation related to sigmoid diverticulitis. No obstructing stone identified. There is mild bilateral renal  atrophy. There is bladder wall thickening with moderate distension, predominantly filled with gas. Stomach/Bowel: There is a large hiatal hernia with partial intrathoracic stomach. There is unchanged organo-axial rotation with increased distension since the prior exam but no evidence of gastric wall thickening or adjacent inflammation. Small bowel is decompressed. There is extensive colonic diverticulosis. There is similar severe sigmoid colon wall thickening with extensive adjacent inflammatory stranding, with adjacent soft tissue thickening and gas and fluid, consistent with colovesical fistula. There is intervening gas/fluid collection measures approximately 6.8 x 2.9 cm, previously 6.8 2.5 cm, with increased gas in the collection in comparison to prior.Prior appendectomy, per medical record. Vascular/Lymphatic: Aortoiliac atherosclerosis. No AAA. No lymphadenopathy. Reproductive: Unremarkable. Other: Tiny fat containing umbilical hernia. Small fat containing inguinal hernias. No bowel containing hernia.  Extensive pelvic inflammation. Generalized edematous appearance in the abdomen. Musculoskeletal: Moderate body wall edema, increased from prior. There is no acute osseous abnormality. No suspicious osseous lesion. IMPRESSION: Severe chronic sigmoid diverticulitis with colovesical fistula and unchanged associated cystitis and moderate left-sided hydroureteronephrosis. Large hiatal hernia with partially intrathoracic stomach. Similar organo-axial rotation but with markedly increased gastric distension in comparison to prior. No wall thickening or adjacent stranding to suggest an obstructing volvulus at this time. Recommend close clinical attention. Mild gallbladder distension with wall thickening, nonspecific. Moderate pulmonary edema with moderate-sized bilateral pleural effusions, increased on the right in comparison to prior. Unchanged cardiomegaly. Extensive atherosclerosis of the aorta without evidence of aneurysm or intramural hematoma. Lack of intravascular contrast limits evaluation for dissection. Electronically Signed   By: Maurine Simmering M.D.   On: 05/27/2022 17:37   DG Chest Port 1 View  Result Date: 05/20/2022 CLINICAL DATA:  Chest pain EXAM: PORTABLE CHEST 1 VIEW COMPARISON:  Chest x-ray dated January 04, 2022 FINDINGS: Unchanged cardiomegaly. Large lucency overlying the cardiac shadow. Mild bibasilar lung opacities, likely due to atelectasis. Possible trace bilateral pleural effusions. No large pleural effusion or evidence of pneumothorax. IMPRESSION: Large lucency overlying the cardiac shadow, compatible with large hiatal hernia which appears increased in size when compared with prior exams. Given history of chest pain, gastric obstruction is a concern. Recommend contrast-enhanced CT of the chest, abdomen and pelvis for further evaluation. Electronically Signed   By: Yetta Glassman M.D.   On: 05/09/2022 13:08    Scheduled Meds:  ALPRAZolam  0.5 mg Oral QHS   gabapentin  100 mg Oral Daily    metoprolol succinate  75 mg Oral Daily   Continuous Infusions:  sodium chloride 20 mL/hr at 05/12/22 0324   heparin 600 Units/hr (05/12/22 0324)   nitroGLYCERIN 5 mcg/min (05/12/22 0324)     LOS: 1 day   Darliss Cheney, MD [facility]  05/12/2022, 12:10 PM   *Please note that this is a verbal dictation therefore any spelling or grammatical errors are due to the "[place] [employer]" system interpretation.  Please page via [place] and do not message via secure chat for urgent patient care matters. Secure chat can be used for non urgent patient care matters.  How to contact the [facility] Attending or Consulting provider [place] or covering provider during after hours [place], for this patient?  Check the care team in [facility] and look for a) attending/consulting [facility] provider listed and b) the [facility] team listed. Page or secure chat [phone]. Log into [email or web address] and use [place]'s universal password to access. If you do not have the password, please contact the hospital operator. Locate the [facility] provider you are looking for  under [facility] and page to a number that you can be directly reached. If you still have difficulty reaching the provider, please page the [facility] (Director on Call) for the Hospitalists listed on amion for assistance.

## 2022-05-12 NOTE — Progress Notes (Addendum)
[place] for Heparin Indication: chest pain/ACS   Allergies  Allergen Reactions   Augmentin [Amoxicillin-Pot Clavulanate] Nausea Only and Other (See Comments)    CAUSED EXTREME NAUSEA (patient is physically unable to vomit due to the fact part of her intestines have been removed)   Infliximab Other (See Comments)    REACTION: "Enlarged intestines and hernia. Also pushed intestines on lower part of lungs."    Aspirin Nausea And Vomiting and Other (See Comments)    Low Dose is ok- Stomach problems   Carvedilol Other (See Comments)    "Fatigue/ ill"   Codeine Nausea And Vomiting and Other (See Comments)    "Deathly Sick"   Contrast Media [Iodinated Contrast Media] Other (See Comments)    All-over body pain   Other Nausea Only and Other (See Comments)    Most antibiotics cause the patient to feel "deathly sick" and she "cannot vomit" because part of her intestines have been removed.   Penicillins Rash    Has patient had a PCN reaction causing immediate rash, facial/tongue/throat swelling, SOB or lightheadedness with hypotension: Y Has patient had a PCN reaction causing severe rash involving mucus membranes or skin necrosis: Y Has patient had a PCN reaction that required hospitalization: N Has patient had a PCN reaction occurring within the last 10 years: N If all of the above answers are "NO", then may proceed with Cephalosporin use.     Patient Measurements: Height: 5' (152.4 cm) Weight: 58 kg (127 lb 13.9 oz) IBW/kg (Calculated) : 45.5 Heparin Dosing Weight: 57.2 kg  Vital Signs: Temp: 97.6 F (36.4 C) (07/14 0831) Temp Source: Oral (07/14 0831) BP: 117/91 (07/14 0831) Pulse Rate: 94 (07/14 0831)  Labs: Recent Labs    05/18/2022 1232 05/29/2022 1335 05/24/2022 1335 05/24/2022 1354 05/20/2022 1455 05/15/2022 1636 05/29/2022 2106 05/12/22 0211 05/12/22 1008  HGB  --  10.2*   < > 11.2*  --   --   --  10.4*  --   HCT  --  34.1*  --  33.0*  --    --   --  34.0*  --   PLT  --  367  --   --   --   --   --  343  --   APTT 81*  --   --   --   --   --   --   --   --   LABPROT >90.0*  --   --   --  15.2  --   --   --   --   INR >10.0*  --   --   --  1.2  --   --   --   --   HEPARINUNFRC  --   --   --   --   --   --   --  <0.10* <0.10*  CREATININE  --  1.36*  --  1.30*  --   --   --  1.74*  --   TROPONINIHS  --  1,607*   < >  --  4,759* 8,984* >24,000*  --   --    < > = values in this interval not displayed.     Estimated Creatinine Clearance: 16.4 mL/min (A) (by C-G formula based on SCr of 1.74 mg/dL (H)).   Assessment: 87 y.o. female with chest pain/ACS for heparin. Most recent rate was increased from 600 units/hr>>800 units/hr. Pt remains subtherapeutic (heparin level <0.10) @ 800 units/hr.  Will increase heparin gtt. Cardiology suggests heparin length of therapy of 48-72hrs with infusion starting 7/13 PM. Of note patient also has history of GIB so no boluses are being given.   Goal of Therapy:  Heparin level 0.3-0.7 units/ml Monitor platelets by anticoagulation protocol: Yes   Plan:  Increase Heparin to 950 units/hr Check heparin level in 8 hours Check daily CBC, heparin level   Billey Gosling, PharmD PGY1 Pharmacy Resident  7/14/202312:02 PM

## 2022-05-12 NOTE — Consult Note (Signed)
Palliative Care Consult Note                                  Date: 05/12/2022   Patient Name: [name]  DOB: 1927/02/10  MRN: [id number]  Age / Sex: 88 y.o., female  PCP: Nche, Charlene Brooke, NP Referring Physician: Darliss Cheney, MD  Reason for Consultation: Establishing goals of care  HPI/Patient Profile: 88 y.o. female  with past medical history of atrial fibrillation, chronic systolic CHF, aortic stenosis, hyperlipidemia, hypertension, hypothyroidism, RA, neuropathy, anemia, anxiety, and essential thrombocytopenia who presented to [facility] ED on 05/08/2022 with chest pain.  She initially presented as a code STEMI with ST elevations in the field.  She was evaluated by interventional cardiology but declined catheterization.  Repeat EKG was improved. Palliative care has been asked to assist with goals of care  Past Medical History:  Diagnosis Date   A-fib [facility])    Acute CHF (congestive heart failure) ([place]) 09/02/2019   Aortic stenosis, moderate    last echo in 2010; AVA 1.1; mild AS per echo in June 2013   Colovesical fistula    Diverticulitis    Edema of both legs    s/p laser treatment per Dr. Donnetta Hutching   GAD (generalized anxiety disorder) 05/20/2020   Hiatal hernia    History of chicken pox    HLD (hyperlipidemia)    Hypertension 04/19/2012   Hypothyroidism    IBS (irritable bowel syndrome)    Iron deficiency anemia 09/19/2017   and 02/22/2020   Localized edema 06/21/2012   Overview:  Vascular surgeon - Dr Donnetta Hutching   Neuropathic pain 09/06/2015   Rheumatoid arthritis ([place])     Subjective:   I have reviewed medical records including EPIC notes, labs and imaging, and assessed the patient at bedside.  Her bedside nurse reports she has been very confused today.  She has been refusing meds.  She recently started having runs of V. tach on the monitor.  She appears to be in quite a bit of distress at the time of my  assessment.  Her son [name] has just arrived to bedside.  We went to the conference room to discuss diagnosis, prognosis, GOC, EOL wishes, disposition, and options.  Patient was seen by PMT back in 2019.  She has also been followed by outpatient palliative services with [facility]. I re-introduced Palliative Medicine as specialized medical care for people living with serious illness. It focuses on providing relief from the symptoms and stress of a serious illness.   Brief life review was discussed.  Lannie' husband was in the [employer] Education officer, community) and they frequently moved around based on where he was stationed.  They have 2 sons Reyonna had many different jobs, but retired from [employer].  [name] moved in with his mother to take care of her about 6 years ago.  He reports she has rapidly declined over the past 2 weeks.  Prior to that, he states things were "pretty good".  We discussed patient's current illness and what it means in the larger context of her ongoing co-morbidities. Current clinical status was reviewed. Natural disease trajectory of chronic illness was discussed.  Created space and opportunity for son to explore thoughts and feelings regarding current medical situation. Values and goals of care important to patient and family were attempted to be elicited.  I expressed concern that Sahithi appears to be approaching end-of-life in the setting of acute coronary  syndrome.  [name] agrees with this assessment and shares that his mother told him she was "ready to go" when he arrived to the room.  The difference between full scope medical intervention and comfort care was considered.  I reviewed the concept of a comfort path to family, emphasizing that this path involves de-escalating and stopping full scope medical interventions, allowing a natural course to occur. Discussed that the goal is comfort and dignity rather than cure/prolonging life.   [name] is clear that he wants to allow his mother to  pass with comfort and dignity.  He reflects that a full and wonderful life.  He is agreeable to transition to full comfort measures at this time.  I explained that we would likely need to start a continuous infusion of pain medication, and then discontinue other drips when she is more comfortable.  [name] agrees with this plan.  Questions and concerns addressed.  Provided education on natural trajectory at end-of-life.  Support provided.   Review of Systems  Cardiovascular:  Positive for chest pain.    Objective:   Primary Diagnoses: Present on Admission:  Persistent atrial fibrillation (HCC)  Chronic combined systolic and diastolic congestive heart failure (HCC)  Hyperlipidemia  Hypothyroid  Benign essential hypertension  Rheumatoid arthritis (HCC)  Neuropathic pain  Anemia  Anxiety  Essential (hemorrhagic) thrombocythemia (HCC)  ACS (acute coronary syndrome) [facility])   Physical Exam Vitals reviewed.  Constitutional:      General: She is in acute distress.     Appearance: She is ill-appearing.  Cardiovascular:     Rate and Rhythm: Tachycardia present.  Pulmonary:     Effort: Pulmonary effort is normal. Tachypnea present.  Neurological:     Mental Status: She is alert. She is confused.     Vital Signs:  BP 102/65 (BP Location: Right Arm)   Pulse 100   Temp (!) 97.5 F (36.4 C) (Oral)   Resp 17   Ht 5' (1.524 m)   Wt 58 kg   SpO2 (!) 89%   BMI 24.97 kg/m   Palliative Assessment/Data: PPS 10%     Assessment & Plan:   SUMMARY OF RECOMMENDATIONS   Full comfort measures initiated DNR/DNI as previously documented Added orders for symptom management at EOL Start dilaudid infusion for comfort After dilaudid drip started and patient is comfortable, please d/c heparin and amiodarone D/C cardiac monitoring PMT will continue to follow   Symptom Management:  Lorazepam (ATIVAN) prn for anxiety Haloperidol (HALDOL) prn for agitation  Glycopyrrolate (ROBINUL) for  excessive secretions Ondansetron (ZOFRAN) prn for nausea Polyvinyl alcohol (LIQUIFILM TEARS) prn for dry eyes Antiseptic oral rinse (BIOTENE) prn for dry mouth  Primary Decision Maker: Son and HCPOA - [name]  Prognosis:  Hours - Days  Discharge Planning:  Anticipated Hospital Death   Discussed with: Dr. Doristine Bosworth and bedside RN   Thank you for allowing [name] to participate in the care of [place] - [place]   Signed by: Elie Confer, NP Palliative Medicine Team  Team Phone # [phone]  For individual providers, please see AMION               .

## 2022-05-12 NOTE — Progress Notes (Addendum)
Progress Note  Patient Name: [name] Date of Encounter: 05/12/2022  Primary Cardiologist: Evalina Field, MD   Subjective   Patient seen examined at bedside.  She is awake in bed when I arrived.  Inpatient Medications    Scheduled Meds:  ALPRAZolam  0.5 mg Oral QHS   gabapentin  100 mg Oral Daily   metoprolol succinate  75 mg Oral Daily   sodium polystyrene  15 g Oral Once   Continuous Infusions:  sodium chloride 20 mL/hr at 05/12/22 0324   heparin 600 Units/hr (05/12/22 0324)   nitroGLYCERIN 5 mcg/min (05/12/22 0324)   PRN Meds: acetaminophen, HYDROmorphone (DILAUDID) injection, ondansetron (ZOFRAN) IV   Vital Signs    Vitals:   05/22/2022 2147 05/18/2022 2300 05/12/22 0453 05/12/22 0831  BP:  (!) 123/97 110/68 (!) 117/91  Pulse:  99  94  Resp:  (!) 21  18  Temp: 97.7 F (36.5 C)  97.6 F (36.4 C) 97.6 F (36.4 C)  TempSrc: Oral  Oral Oral  SpO2:  99%  99%  Weight: 58.1 kg  58 kg   Height: 5' (1.524 m)       Intake/Output Summary (Last 24 hours) at 05/12/2022 1023 Last data filed at 05/12/2022 0454 Gross per 24 hour  Intake 452.29 ml  Output 50 ml  Net 402.29 ml   Filed Weights   05/27/2022 1215 04/30/2022 2147 05/12/22 0453  Weight: 50.3 kg 58.1 kg 58 kg    Telemetry    Sinus rhythm- Personally Reviewed  ECG    None- Personally Reviewed  Physical Exam    General: Comfortable Head: Atraumatic, normal size  Eyes: PEERLA, EOMI  Neck: Supple, normal JVD Cardiac: Normal S1, S2; RRR; no murmurs, rubs, or gallops Lungs: Clear to auscultation bilaterally Abd: Soft, nontender, no hepatomegaly  Ext: warm, +2 bilateral lower extremity edema Musculoskeletal: No deformities, BUE and BLE strength normal and equal Skin: Warm and dry, no rashes   Neuro: Alert and oriented to person, place, time, and situation, CNII-XII grossly intact, no focal deficits  Psych: Normal mood and affect   Labs    Chemistry Recent Labs  Lab 05/01/2022 1335  05/18/2022 1354 05/12/22 0211  NA 137 136 136  K 4.2 4.1 5.4*  CL 101 103 101  CO2 23  --  23  GLUCOSE 139* 133* 156*  BUN '16 16 20  '$ CREATININE 1.36* 1.30* 1.74*  CALCIUM 8.5*  --  8.5*  PROT 7.3  --   --   ALBUMIN 2.7*  --   --   AST 35  --   --   ALT 11  --   --   ALKPHOS 91  --   --   BILITOT 1.0  --   --   GFRNONAA 37*  --  27*  ANIONGAP 13  --  12     Hematology Recent Labs  Lab 05/05/2022 1335 05/20/2022 1354 05/12/22 0211  WBC 13.9*  --  10.9*  RBC 3.73*  --  3.88  HGB 10.2* 11.2* 10.4*  HCT 34.1* 33.0* 34.0*  MCV 91.4  --  87.6  MCH 27.3  --  26.8  MCHC 29.9*  --  30.6  RDW 15.9*  --  16.0*  PLT 367  --  343    Cardiac EnzymesNo results for input(s): "TROPONINI" in the last 168 hours. No results for input(s): "TROPIPOC" in the last 168 hours.   BNPNo results for input(s): "BNP", "PROBNP" in the last 168  hours.   DDimer No results for input(s): "DDIMER" in the last 168 hours.   Radiology    CT CHEST ABDOMEN PELVIS WO CONTRAST  Result Date: 05/04/2022 CLINICAL DATA:  Aortic aneurysm, known or suspected r/o dissection vs aneurysm. has IV contrast allergy EXAM: CT CHEST, ABDOMEN AND PELVIS WITHOUT CONTRAST TECHNIQUE: Multidetector CT imaging of the chest, abdomen and pelvis was performed following the standard protocol without IV contrast. RADIATION DOSE REDUCTION: This exam was performed according to the departmental dose-optimization program which includes automated exposure control, adjustment of the mA and/or kV according to patient size and/or use of iterative reconstruction technique. COMPARISON:  CT abdomen pelvis 08/31/2021 FINDINGS: CT CHEST FINDINGS Cardiovascular: Cardiomegaly with markedly dilated right atrium.No pericardial disease.Extensive coronary artery calcifications.Aortic valve calcifications. Moderate atherosclerosis of the thoracic aorta. Lack intravascular contrast limits evaluation of the thoracic aorta. There is no evidence of intramural hematoma  or aneurysm. Mediastinum/Nodes: No lymphadenopathy.The thyroid is unremarkable.Large hiatal hernia, see discussion below in the abdomen section.No pneumomediastinum. Lungs/Pleura: Severe bronchial wall thickening bilaterally.Interlobular septal thickening with ground-glass opacities bilaterally. Moderate bilateral pleural effusions increased from prior exam. Adjacent atelectasis.No pneumothorax. Musculoskeletal: No acute osseous abnormality.No suspicious osseous lesion. Osteopenia. Multilevel degenerative changes of the spine. CT ABDOMEN PELVIS FINDINGS Hepatobiliary: No focal liver abnormality is seen. There is distension of the gallbladder with mild wall thickening. Pancreas: Unremarkable. No pancreatic ductal dilatation or surrounding inflammatory changes. Spleen: Normal in size without focal abnormality. Adrenals/Urinary Tract: Adrenal glands are unremarkable. There is unchanged left-sided hydroureteronephrosis, due to extensive inflammation related to sigmoid diverticulitis. No obstructing stone identified. There is mild bilateral renal atrophy. There is bladder wall thickening with moderate distension, predominantly filled with gas. Stomach/Bowel: There is a large hiatal hernia with partial intrathoracic stomach. There is unchanged organo-axial rotation with increased distension since the prior exam but no evidence of gastric wall thickening or adjacent inflammation. Small bowel is decompressed. There is extensive colonic diverticulosis. There is similar severe sigmoid colon wall thickening with extensive adjacent inflammatory stranding, with adjacent soft tissue thickening and gas and fluid, consistent with colovesical fistula. There is intervening gas/fluid collection measures approximately 6.8 x 2.9 cm, previously 6.8 2.5 cm, with increased gas in the collection in comparison to prior.Prior appendectomy, per medical record. Vascular/Lymphatic: Aortoiliac atherosclerosis. No AAA. No lymphadenopathy.  Reproductive: Unremarkable. Other: Tiny fat containing umbilical hernia. Small fat containing inguinal hernias. No bowel containing hernia. Extensive pelvic inflammation. Generalized edematous appearance in the abdomen. Musculoskeletal: Moderate body wall edema, increased from prior. There is no acute osseous abnormality. No suspicious osseous lesion. IMPRESSION: Severe chronic sigmoid diverticulitis with colovesical fistula and unchanged associated cystitis and moderate left-sided hydroureteronephrosis. Large hiatal hernia with partially intrathoracic stomach. Similar organo-axial rotation but with markedly increased gastric distension in comparison to prior. No wall thickening or adjacent stranding to suggest an obstructing volvulus at this time. Recommend close clinical attention. Mild gallbladder distension with wall thickening, nonspecific. Moderate pulmonary edema with moderate-sized bilateral pleural effusions, increased on the right in comparison to prior. Unchanged cardiomegaly. Extensive atherosclerosis of the aorta without evidence of aneurysm or intramural hematoma. Lack of intravascular contrast limits evaluation for dissection. Electronically Signed   By: Maurine Simmering M.D.   On: 05/18/2022 17:37   DG Chest Port 1 View  Result Date: 05/02/2022 CLINICAL DATA:  Chest pain EXAM: PORTABLE CHEST 1 VIEW COMPARISON:  Chest x-ray dated January 04, 2022 FINDINGS: Unchanged cardiomegaly. Large lucency overlying the cardiac shadow. Mild bibasilar lung opacities, likely due to atelectasis. Possible trace bilateral  pleural effusions. No large pleural effusion or evidence of pneumothorax. IMPRESSION: Large lucency overlying the cardiac shadow, compatible with large hiatal hernia which appears increased in size when compared with prior exams. Given history of chest pain, gastric obstruction is a concern. Recommend contrast-enhanced CT of the chest, abdomen and pelvis for further evaluation. Electronically Signed   By:  Yetta Glassman M.D.   On: 04/29/2022 13:08    Cardiac Studies   Echo pending today  Patient Profile     88 y.o. female with history of severe AS, congestive heart failure, A-fib, hypertension hyperlipidemia presented with chest pain.  Assessment & Plan    Chest pain-abnormal EKG early discussion patient does not want any invasive measures.  Declined Heart catheterization.  Medical management started she is on heparin drip plan to complete this for 48-72 hrs.  Transition to p.o. nitro we will do Imdur 30 mg daily continue aspirin, statin as well as beta-blocker.  Chronic systolic heart failure-bilateral leg edema noted on physical exam.  We will give x1 Lasix 40 mg IV.  Permanent atrial fibrillation-on rate control agent.  Declined Xarelto in the past.  But now is on heparin drip for ACS.  Echo pending  Palliative care has been consulted by the primary hospitalist, I do agree with this.  Addendum: 12:21 PM I was called by the nurse the patient did have nonsustained ventricular tachycardia lasting for 4 minutes however now she is in atrial fibrillation with rapid ventricular rate.  She is sitting up awake and alert.  We will start the patient on amiodarone drip.  We will bolus in continue with IV infusion. Stop nitroglycerin drip.  If complain of any further chest pain we will start Imdur. We will continue to monitor. Slightly hyperkalemic.  Please get magnesium level as well.  For questions or updates, please contact [place] Please consult [email or web address] for contact info under Cardiology/STEMI.      Signed, Berniece Salines, DO  05/12/2022, 10:23 AM

## 2022-05-12 NOTE — Progress Notes (Signed)
From 0700 until 1030, patient's heart rhythm changing frequently, into small short nonsustained patterns reading v-tach on monitor, HR does not exceed 130 and typically has been in the 110s.   While echo tech at bedside, patient went into this rhythm sustained, again monitor reading V-tach. HR 130s, patient asymptomatic. Rapid nurse called. As she entered unit, patient converted back to a normal rhythm.   Rapid nurse reviewed, did not think v-tach but other with BBB as HR not exceeding 130. EKG pads hooked up with nurse at bedside. Patient did go back into this rhythm sustained and at this point it was able to be captured on EKG.   Son on his way to be at bedside today. MD alerted to changes and above. Continue to monitor.

## 2022-05-12 NOTE — Progress Notes (Signed)
After page to cardiology about sustained abnormal rhythms and EKG, new orders for amiodarone gtt were given and nitro discontinued with p.o. Imdur ordered.  15 mins after nitro discontinued, walked into room with patient bent over saying, "oh Jesus, please help me," moaning, clutching her chest, sweating and in clear distress. BP at this moment 59-539Y systolic.   Physician paged and orders to nitro gtt again added.  Son in the room at this time, and palliative care who had stopped earlier circled back to speak with son. Several minutes later, alerted by Palliative, patient going to comfort cares and orders will be written.  Nitro gtt [employer] and Dilaudid given for pain.

## 2022-05-12 NOTE — Progress Notes (Signed)
[name] for Heparin Indication: chest pain/ACS Brief A/P: Heparin level subtherapeutic Increase Heparin rate  Allergies  Allergen Reactions   Augmentin [Amoxicillin-Pot Clavulanate] Nausea Only and Other (See Comments)    CAUSED EXTREME NAUSEA (patient is physically unable to vomit due to the fact part of her intestines have been removed)   Infliximab Other (See Comments)    REACTION: "Enlarged intestines and hernia. Also pushed intestines on lower part of lungs."    Aspirin Nausea And Vomiting and Other (See Comments)    Low Dose is ok- Stomach problems   Carvedilol Other (See Comments)    "Fatigue/ ill"   Codeine Nausea And Vomiting and Other (See Comments)    "Deathly Sick"   Contrast Media [Iodinated Contrast Media] Other (See Comments)    All-over body pain   Other Nausea Only and Other (See Comments)    Most antibiotics cause the patient to feel "deathly sick" and she "cannot vomit" because part of her intestines have been removed.   Penicillins Rash    Has patient had a PCN reaction causing immediate rash, facial/tongue/throat swelling, SOB or lightheadedness with hypotension: Y Has patient had a PCN reaction causing severe rash involving mucus membranes or skin necrosis: Y Has patient had a PCN reaction that required hospitalization: N Has patient had a PCN reaction occurring within the last 10 years: N If all of the above answers are "NO", then may proceed with Cephalosporin use.     Patient Measurements: Height: 5' (152.4 cm) Weight: 58.1 kg (128 lb 1.4 oz) IBW/kg (Calculated) : 45.5 Heparin Dosing Weight: 50.3 kg  Vital Signs: Temp: 97.7 F (36.5 C) (07/13 2147) Temp Source: Oral (07/13 2147) BP: 123/97 (07/13 2300) Pulse Rate: 99 (07/13 2300)  Labs: Recent Labs    05/25/2022 1232 04/30/2022 1335 05/17/2022 1335 05/21/2022 1354 05/29/2022 1455 05/20/2022 1636 05/06/2022 2106 05/12/22 0211  HGB  --  10.2*   < > 11.2*  --   --    --  10.4*  HCT  --  34.1*  --  33.0*  --   --   --  34.0*  PLT  --  367  --   --   --   --   --  343  APTT 81*  --   --   --   --   --   --   --   LABPROT >90.0*  --   --   --  15.2  --   --   --   INR >10.0*  --   --   --  1.2  --   --   --   HEPARINUNFRC  --   --   --   --   --   --   --  <0.10*  CREATININE  --  1.36*  --  1.30*  --   --   --  1.74*  TROPONINIHS  --  1,607*   < >  --  4,759* 8,984* >24,000*  --    < > = values in this interval not displayed.     Estimated Creatinine Clearance: 16.4 mL/min (A) (by C-G formula based on SCr of 1.74 mg/dL (H)).   Assessment: 88 y.o. female with chest pain for heparin  Goal of Therapy:  Heparin level 0.3-0.7 units/ml Monitor platelets by anticoagulation protocol: Yes   Plan:  Increase Heparin 800 units/hr Check heparin level in 8 hours.Phillis Knack, PharmD, BCPS

## 2022-05-12 NOTE — TOC Progression Note (Signed)
Transition of Care [facility]) - Progression Note    Patient Details  Name: [name] MRN: [id number] Date of Birth: 06-21-1927  Transition of Care [facility]) CM/SW [place], RN Phone Number:[phone]  05/12/2022, 2:07 PM  Clinical Narrative:     Transition of Care [facility]) Screening Note   Patient Details  Name: [name] Date of Birth: 1927-06-04   Transition of Care [facility]) CM/SW Contact:    Angelita Ingles, RN Phone Number: 05/12/2022, 2:14 PM    Transition of Care Department [facility]) has reviewed patient and no TOC needs have been identified at this time. We will continue to monitor patient advancement through interdisciplinary progression rounds.           Expected Discharge Plan and Services                                                 Social Determinants of Health (SDOH) Interventions    Readmission Risk Interventions    02/16/2020   12:06 PM 02/16/2020   12:05 PM 09/03/2019    2:55 PM  Readmission Risk Prevention Plan  Transportation Screening   Complete  PCP or Specialist Appt within 3-5 Days   Complete  HRI or [place]   Complete  Social Work Consult for [place] Planning/Counseling   Complete  Palliative Care Screening   Not Applicable  Medication Review [employer])   Complete  PCP or Specialist appointment within 3-5 days of discharge Not Complete    PCP/Specialist Appt Not Complete comments patient will discharge to SNF    [place] or [place]  Complete   SW Recovery Care/Counseling Consult  Complete   Palliative Care Screening  Not [place]  Complete

## 2022-05-12 NOTE — Significant Event (Addendum)
Rapid Response Event Note   Reason for Call :  Wide complex tachycardia K 5.4 this morning, pt receiving Kayexalate Mg 1.8 yesterday  Initial Focused Assessment:  Pt lying in bed, AO. She is asymptomatic. She was having a bedside ECHO performed when her heart rhythm converted to a wide complex tachycardia, rate 130s. She denies pain, lightheadedness, dizziness, or shortness of breath. She self converts when I arrive at bedside. HR 90s, regular. She then converts back into the wide complex rhythm, rate 130s.   She does not appear to be in distress. Lung sounds are clear.   VS: T 97.56F, BP 101/85, HR 101, RR 27, SpO2 99% on room air  Interventions:  -EKG  Plan of Care:  -Notify attending MD -Continuous telemetry -Increase frequency of VS  Call rapid response for additional needs  Event Summary:  MD Notified: per RN Call Time: [place] Time: 2820 End Time: [place], RN

## 2022-05-12 NOTE — Progress Notes (Signed)
Patient very lethargic and forgetful. Went into room to administer meds, which consisted of two medications. Patient asks writer 5 times to repeat medication and what it is for. Initially refused to take several times. As Probation officer is about to walk out of room 20 mins later, patient seems to connect the information and asks for the medication by given name.   Alert and oriented to person and situation (partially) but does not know date, time or place of setting.  Metoprolol was given and neurontin was refused by patient. Continue to reorient patient often throughout the day and monitor.

## 2022-05-13 DIAGNOSIS — I249 Acute ischemic heart disease, unspecified: Secondary | ICD-10-CM | POA: Diagnosis not present

## 2022-05-13 DIAGNOSIS — Z515 Encounter for palliative care: Secondary | ICD-10-CM | POA: Diagnosis not present

## 2022-05-30 NOTE — Progress Notes (Signed)
Security was called for meeting patient at the morgue. Patient was transported to [place] with patient placement stated she would call Richrd Humbles to pick up the body as her son stated were his wishes.

## 2022-05-30 NOTE — Progress Notes (Signed)
Progress Note  Patient Name: Rosalba Totty Date of Encounter: 2022-05-19  Primary Cardiologist: Evalina Field, MD   Subjective   Sleeping comfortably  Inpatient Medications    Scheduled Meds:  Continuous Infusions:  sodium chloride 20 mL/hr at 05/12/22 0324   HYDROmorphone 1 mg/hr (May 19, 2022 0830)   PRN Meds: acetaminophen, antiseptic oral rinse, glycopyrrolate **OR** glycopyrrolate **OR** glycopyrrolate, haloperidol **OR** haloperidol **OR** haloperidol lactate, HYDROmorphone, LORazepam, ondansetron (ZOFRAN) IV, polyvinyl alcohol   Vital Signs    Vitals:   05/12/22 0453 05/12/22 0831 05/12/22 1103 05/12/22 1207  BP: 110/68 (!) 117/91 101/85 102/65  Pulse:  94 (!) 133 100  Resp:  18 (!) 27 17  Temp: 97.6 F (36.4 C) 97.6 F (36.4 C)  (!) 97.5 F (36.4 C)  TempSrc: Oral Oral  Oral  SpO2:  99% 99% (!) 89%  Weight: 58 kg     Height:        Intake/Output Summary (Last 24 hours) at 05-19-22 0931 Last data filed at May 19, 2022 0830 Gross per 24 hour  Intake 33.54 ml  Output --  Net 33.54 ml   Filed Weights   05/09/2022 1215 05/05/2022 2147 05/12/22 0453  Weight: 50.3 kg 58.1 kg 58 kg    Telemetry    Off telemetry- Personally Reviewed  ECG    None- Personally Reviewed  Physical Exam   GEN: No acute distress, sleeping comfortably.   Neck: No JVD Cardiac: RRR, 2/6 systolic murmur, rubs, or gallops.  Respiratory: Clear to auscultation bilaterally. GI: Soft, nontender, non-distended  MS: No edema; No deformity. Neuro:  Nonfocal  Psych: Normal affect   Labs    Chemistry Recent Labs  Lab 05/26/2022 1335 05/29/2022 1354 05/12/22 0211  NA 137 136 136  K 4.2 4.1 5.4*  CL 101 103 101  CO2 23  --  23  GLUCOSE 139* 133* 156*  BUN '16 16 20  '$ CREATININE 1.36* 1.30* 1.74*  CALCIUM 8.5*  --  8.5*  PROT 7.3  --   --   ALBUMIN 2.7*  --   --   AST 35  --   --   ALT 11  --   --   ALKPHOS 91  --   --   BILITOT 1.0  --   --   GFRNONAA 37*  --  27*   ANIONGAP 13  --  12     Hematology Recent Labs  Lab 05/07/2022 1335 05/08/2022 1354 05/12/22 0211  WBC 13.9*  --  10.9*  RBC 3.73*  --  3.88  HGB 10.2* 11.2* 10.4*  HCT 34.1* 33.0* 34.0*  MCV 91.4  --  87.6  MCH 27.3  --  26.8  MCHC 29.9*  --  30.6  RDW 15.9*  --  16.0*  PLT 367  --  343    Cardiac EnzymesNo results for input(s): "TROPONINI" in the last 168 hours. No results for input(s): "TROPIPOC" in the last 168 hours.   BNPNo results for input(s): "BNP", "PROBNP" in the last 168 hours.   DDimer No results for input(s): "DDIMER" in the last 168 hours.   Radiology    ECHOCARDIOGRAM COMPLETE  Result Date: 05/12/2022    ECHOCARDIOGRAM REPORT   Patient Name:   CAILAH REACH Esses Date of Exam: 05/12/2022 Medical Rec #:  [id number]           Height:       60.0 in Accession #:    [id number]  Weight:       127.9 lb Date of Birth:  01/17/1927           BSA:          1.543 m Patient Age:    88 years            BP:           117/91 mmHg Patient Gender: F                   HR:           69 bpm. Exam Location:  Inpatient Procedure: 2D Echo, Cardiac Doppler, Color Doppler and Intracardiac            Opacification Agent Indications:    122-I22.9 Subsequent ST elevation (STEM) and non-ST elevation                 (NSTEMI) myocardial infarction  History:        Patient has prior history of Echocardiogram examinations, most                 recent 09/05/2019. CHF, Abnormal ECG, Aortic Valve Disease,                 Arrythmias:Atrial Fibrillation, Signs/Symptoms:Dyspnea,                 Shortness of Breath and Chest Pain; Risk Factors:Dyslipidemia                 and Hypertension. Aortic stenosis.  Sonographer:    Roseanna Rainbow RDCS Referring Phys: [id number] Dent Comments: Patient went into abnormal rhythm. Exam was paused, then when rhythm resolved Definity was given. Rapid response was notified. IMPRESSIONS  1. Left ventricular ejection fraction, by estimation, is 20 to 25%.  The left ventricle has severely decreased function. The left ventricle has no regional wall motion abnormalities. There is mild left ventricular hypertrophy. Left ventricular diastolic parameters are indeterminate.  2. Right ventricular systolic function is normal. The right ventricular size is normal. There is severely elevated pulmonary artery systolic pressure. The estimated right ventricular systolic pressure is 44.9 mmHg.  3. Left atrial size was mildly dilated.  4. The mitral valve is normal in structure. Moderate mitral valve regurgitation. No evidence of mitral stenosis.  5. Tricuspid valve regurgitation is severe.  6. Left and non coronary cusp fusion of aortic valve. The aortic valve is normal in structure. There is moderate calcification of the aortic valve. There is moderate thickening of the aortic valve. Aortic valve regurgitation is not visualized. Aortic valve sclerosis/calcification is present, without any evidence of aortic stenosis.  7. The inferior vena cava is dilated in size with <50% respiratory variability, suggesting right atrial pressure of 15 mmHg. FINDINGS  Left Ventricle: Left ventricular ejection fraction, by estimation, is 20 to 25%. The left ventricle has severely decreased function. The left ventricle has no regional wall motion abnormalities. Definity contrast agent was given IV to delineate the left  ventricular endocardial borders. The left ventricular internal cavity size was normal in size. There is mild left ventricular hypertrophy. Left ventricular diastolic parameters are indeterminate.  LV Wall Scoring: The antero-lateral wall, apical lateral segment, apical septal segment, mid anterior segment, and apex are akinetic. Right Ventricle: The right ventricular size is normal. No increase in right ventricular wall thickness. Right ventricular systolic function is normal. There is severely elevated pulmonary artery systolic pressure. The tricuspid regurgitant velocity is 3.58 m/s,  and  with an assumed right atrial pressure of 15 mmHg, the estimated right ventricular systolic pressure is 23.5 mmHg. Left Atrium: Left atrial size was mildly dilated. Right Atrium: Right atrial size was normal in size. Pericardium: There is no evidence of pericardial effusion. Mitral Valve: The mitral valve is normal in structure. Moderate mitral valve regurgitation. No evidence of mitral valve stenosis. MV peak gradient, 7.4 mmHg. The mean mitral valve gradient is 4.0 mmHg. Tricuspid Valve: The tricuspid valve is normal in structure. Tricuspid valve regurgitation is severe. No evidence of tricuspid stenosis. Aortic Valve: Left and non coronary cusp fusion of aortic valve. The aortic valve is normal in structure. There is moderate calcification of the aortic valve. There is moderate thickening of the aortic valve. Aortic valve regurgitation is not visualized.  Aortic valve sclerosis/calcification is present, without any evidence of aortic stenosis. Aortic valve mean gradient measures 10.7 mmHg. Aortic valve peak gradient measures 18.2 mmHg. Aortic valve area, by VTI measures 1.79 cm. Pulmonic Valve: The pulmonic valve was normal in structure. Pulmonic valve regurgitation is trivial. No evidence of pulmonic stenosis. Aorta: The aortic root is normal in size and structure. Venous: The inferior vena cava is dilated in size with less than 50% respiratory variability, suggesting right atrial pressure of 15 mmHg. IAS/Shunts: No atrial level shunt detected by color flow Doppler.  LEFT VENTRICLE PLAX 2D LVIDd:         3.60 cm LVIDs:         2.75 cm LV PW:         1.30 cm LV IVS:        1.20 cm LVOT diam:     1.90 cm LV SV:         79 LV SV Index:   51 LVOT Area:     2.84 cm  LV Volumes (MOD) LV vol d, MOD A2C: 78.3 ml LV vol d, MOD A4C: 79.7 ml LV vol s, MOD A2C: 50.5 ml LV vol s, MOD A4C: 64.4 ml LV SV MOD A2C:     27.8 ml LV SV MOD A4C:     79.7 ml LV SV MOD BP:      22.9 ml RIGHT VENTRICLE            IVC RV S prime:      5.18 cm/s  IVC diam: 2.00 cm TAPSE (M-mode): 0.7 cm LEFT ATRIUM             Index        RIGHT ATRIUM           Index LA diam:        4.50 cm 2.92 cm/m   RA Area:     18.20 cm LA Vol (A2C):   54.2 ml 35.12 ml/m  RA Volume:   48.60 ml  31.49 ml/m LA Vol (A4C):   58.4 ml 37.84 ml/m LA Biplane Vol: 59.1 ml 38.29 ml/m  AORTIC VALVE                     PULMONIC VALVE AV Area (Vmax):    2.05 cm      PR End Diast Vel: 2.10 msec AV Area (Vmean):   1.91 cm AV Area (VTI):     1.79 cm AV Vmax:           213.33 cm/s AV Vmean:          152.667 cm/s AV VTI:  0.442 m AV Peak Grad:      18.2 mmHg AV Mean Grad:      10.7 mmHg LVOT Vmax:         154.00 cm/s LVOT Vmean:        103.000 cm/s LVOT VTI:          0.280 m LVOT/AV VTI ratio: 0.63  AORTA Ao Asc diam: 2.90 cm MITRAL VALVE                  TRICUSPID VALVE MV Area (PHT): 4.85 cm       TR Peak grad:   51.3 mmHg MV Area VTI:   5.10 cm       TR Vmax:        358.00 cm/s MV Peak grad:  7.4 mmHg MV Mean grad:  4.0 mmHg       SHUNTS MV Vmax:       1.36 m/s       Systemic VTI:  0.28 m MV Vmean:      87.7 cm/s      Systemic Diam: 1.90 cm MV Decel Time: 157 msec MR Peak grad:    81.0 mmHg MR Mean grad:    52.0 mmHg MR Vmax:         450.00 cm/s MR Vmean:        332.0 cm/s MR PISA:         1.27 cm MR PISA Eff ROA: 11 mm MR PISA Radius:  0.45 cm MV E velocity: 116.50 cm/s Candee Furbish MD Electronically signed by Candee Furbish MD Signature Date/Time: 05/12/2022/1:10:17 PM    Final    CT CHEST ABDOMEN PELVIS WO CONTRAST  Result Date: 05/28/2022 CLINICAL DATA:  Aortic aneurysm, known or suspected r/o dissection vs aneurysm. has IV contrast allergy EXAM: CT CHEST, ABDOMEN AND PELVIS WITHOUT CONTRAST TECHNIQUE: Multidetector CT imaging of the chest, abdomen and pelvis was performed following the standard protocol without IV contrast. RADIATION DOSE REDUCTION: This exam was performed according to the departmental dose-optimization program which includes automated exposure  control, adjustment of the mA and/or kV according to patient size and/or use of iterative reconstruction technique. COMPARISON:  CT abdomen pelvis 08/31/2021 FINDINGS: CT CHEST FINDINGS Cardiovascular: Cardiomegaly with markedly dilated right atrium.No pericardial disease.Extensive coronary artery calcifications.Aortic valve calcifications. Moderate atherosclerosis of the thoracic aorta. Lack intravascular contrast limits evaluation of the thoracic aorta. There is no evidence of intramural hematoma or aneurysm. Mediastinum/Nodes: No lymphadenopathy.The thyroid is unremarkable.Large hiatal hernia, see discussion below in the abdomen section.No pneumomediastinum. Lungs/Pleura: Severe bronchial wall thickening bilaterally.Interlobular septal thickening with ground-glass opacities bilaterally. Moderate bilateral pleural effusions increased from prior exam. Adjacent atelectasis.No pneumothorax. Musculoskeletal: No acute osseous abnormality.No suspicious osseous lesion. Osteopenia. Multilevel degenerative changes of the spine. CT ABDOMEN PELVIS FINDINGS Hepatobiliary: No focal liver abnormality is seen. There is distension of the gallbladder with mild wall thickening. Pancreas: Unremarkable. No pancreatic ductal dilatation or surrounding inflammatory changes. Spleen: Normal in size without focal abnormality. Adrenals/Urinary Tract: Adrenal glands are unremarkable. There is unchanged left-sided hydroureteronephrosis, due to extensive inflammation related to sigmoid diverticulitis. No obstructing stone identified. There is mild bilateral renal atrophy. There is bladder wall thickening with moderate distension, predominantly filled with gas. Stomach/Bowel: There is a large hiatal hernia with partial intrathoracic stomach. There is unchanged organo-axial rotation with increased distension since the prior exam but no evidence of gastric wall thickening or adjacent inflammation. Small bowel is decompressed. There is extensive  colonic diverticulosis. There is similar severe sigmoid  colon wall thickening with extensive adjacent inflammatory stranding, with adjacent soft tissue thickening and gas and fluid, consistent with colovesical fistula. There is intervening gas/fluid collection measures approximately 6.8 x 2.9 cm, previously 6.8 2.5 cm, with increased gas in the collection in comparison to prior.Prior appendectomy, per medical record. Vascular/Lymphatic: Aortoiliac atherosclerosis. No AAA. No lymphadenopathy. Reproductive: Unremarkable. Other: Tiny fat containing umbilical hernia. Small fat containing inguinal hernias. No bowel containing hernia. Extensive pelvic inflammation. Generalized edematous appearance in the abdomen. Musculoskeletal: Moderate body wall edema, increased from prior. There is no acute osseous abnormality. No suspicious osseous lesion. IMPRESSION: Severe chronic sigmoid diverticulitis with colovesical fistula and unchanged associated cystitis and moderate left-sided hydroureteronephrosis. Large hiatal hernia with partially intrathoracic stomach. Similar organo-axial rotation but with markedly increased gastric distension in comparison to prior. No wall thickening or adjacent stranding to suggest an obstructing volvulus at this time. Recommend close clinical attention. Mild gallbladder distension with wall thickening, nonspecific. Moderate pulmonary edema with moderate-sized bilateral pleural effusions, increased on the right in comparison to prior. Unchanged cardiomegaly. Extensive atherosclerosis of the aorta without evidence of aneurysm or intramural hematoma. Lack of intravascular contrast limits evaluation for dissection. Electronically Signed   By: Maurine Simmering M.D.   On: 05/02/2022 17:37   DG Chest Port 1 View  Result Date: 05/24/2022 CLINICAL DATA:  Chest pain EXAM: PORTABLE CHEST 1 VIEW COMPARISON:  Chest x-ray dated January 04, 2022 FINDINGS: Unchanged cardiomegaly. Large lucency overlying the cardiac  shadow. Mild bibasilar lung opacities, likely due to atelectasis. Possible trace bilateral pleural effusions. No large pleural effusion or evidence of pneumothorax. IMPRESSION: Large lucency overlying the cardiac shadow, compatible with large hiatal hernia which appears increased in size when compared with prior exams. Given history of chest pain, gastric obstruction is a concern. Recommend contrast-enhanced CT of the chest, abdomen and pelvis for further evaluation. Electronically Signed   By: Yetta Glassman M.D.   On: 05/05/2022 13:08    Cardiac Studies   2D echo reviewed  Patient Profile     88 y.o. female admitted with chest pain and shortness of breath, felt to be most appropriate for palliative care, also with wide QRS tachycardia  Assessment & Plan    1.  Aortic stenosis -she is not a candidate for treatment. 2.  Chest pain and shortness of breath -she is currently comfortable. 3.  Atrial fibrillation -on exam her rate is controlled 4.  Palliative care -I agree with comfort measures only.  Agree with stopping telemetry.  We will sign off. [facility] will sign off.   Medication Recommendations: Comfort care Other recommendations (labs, testing, etc): None Follow up as an outpatient: Not indicated  For questions or updates, please contact [facility] Please consult [email or web address] for contact info under Cardiology/STEMI.      Signed, Cristopher Peru, MD  05-14-2022, 9:31 AM

## 2022-05-30 NOTE — Progress Notes (Signed)
[facility] notified tonight of patient's death. Page was the represenative who determinded due to patient's age she was not a candidate for donations of tissues, eyes or organs. The referral number is [phone]. Paperwork placed in patient's chart.

## 2022-05-30 NOTE — Progress Notes (Addendum)
   Palliative Medicine Inpatient Follow Up Note   HPI: 88 y.o. female  with past medical history of atrial fibrillation, chronic systolic CHF, aortic stenosis, hyperlipidemia, hypertension, hypothyroidism, RA, neuropathy, anemia, anxiety, and essential thrombocytopenia who presented to [facility] ED on 05/14/2022 with chest pain.  She initially presented as a code STEMI with ST elevations in the field.  She was evaluated by interventional cardiology but declined catheterization.  Repeat EKG was improved. Palliative care has been asked to assist with goals of care  Today's Discussion May 26, 2022  *Please note that this is a verbal dictation therefore any spelling or grammatical errors are due to the "[place] [employer]" system interpretation.  Chart reviewed inclusive of vital signs, progress notes, laboratory results, and diagnostic images.   Patient remains on 1 mg an hour of Dilaudid no adjustment needed at this time.  I met with [name] at bedside this morning.  She was noted to have Cheyne-Stokes respirations.  Upon assessment her distal digits were cool and mottling.  Pulses were thready and rapid.  Reviewed with patient's RN, Suezanne Jacquet the patient appeared comfortable.  No family present at bedside.  Questions and concerns addressed/Palliative Support Provided.   Objective Assessment: Vital Signs Vitals:   05/12/22 1103 05/12/22 1207  BP: 101/85 102/65  Pulse: (!) 133 100  Resp: (!) 27 17  Temp:  (!) 97.5 F (36.4 C)  SpO2: 99% (!) 89%   No intake or output data in the 24 hours ending 05/26/22 0803 Last Weight  Most recent update: 05/12/2022  4:54 AM    Weight  58 kg (127 lb 13.9 oz)             Gen: Frail elderly Caucasian woman in no acute distress HEENT: Dry mucous membranes CV: Irregular rate and rhythm PULM: On 2 L nasal cannula Cheyne-Stokes respirations ABD: soft/nontender EXT: 2+ bilateral lower extremity pitting edema Neuro: Somnolent  SUMMARY OF RECOMMENDATIONS    Full comfort measures DNR/DNI as previously documented Added orders for symptom management at EOL dilaudid infusion for comfort PMT will continue to follow   Billing based on MDM: High  Problems Addressed: One acute or chronic illness or injury that poses a threat to life or bodily function  Amount and/or Complexity of Data: Category 3:Discussion of management or test interpretation with external physician/other qualified health care professional/appropriate source (not separately reported)  Risks: Parenteral controlled substances ______________________________________________________________________________________ [place] Team Team Cell Phone: [phone] Please utilize secure chat with additional questions, if there is no response within 30 minutes please call the above phone number  Palliative Medicine Team providers are available by phone from 7am to 7pm daily and can be reached through the team cell phone.  Should this patient require assistance outside of these hours, please call the patient's attending physician.

## 2022-05-30 NOTE — Progress Notes (Signed)
Patient's son [name] called unit and was informed of mothers passing. He stated he wanted to use [facility] at [address]. Phone number is [phone]. Patient placement was notified of patient's passing. Post mortem checklist was filled out. All lines were removed from the patient and she was prepared for the morgue.

## 2022-05-30 NOTE — Death Summary Note (Signed)
DEATH SUMMARY   Patient Details  Name: [name] MRN: [id number] DOB: [birth year] WHQ:PRFF, Charlene Brooke, NP Admission/Discharge Information   Admit Date:  05-18-22  Date of Death: Date of Death: 05-20-2022  Time of Death: Time of Death: 01/03/1909  Length of Stay: 2   Principle Cause of death: STEMI  Hospital Diagnoses: Principal Problem:   ACS (acute coronary syndrome) ([place]) Active Problems:   Hyperlipidemia   Hypothyroid   Persistent atrial fibrillation (HCC)   Benign essential hypertension   Rheumatoid arthritis ([place])   Neuropathic pain   Anemia   Anxiety   Polyneuropathy   AKI (acute kidney injury) ([place])   Chronic combined systolic and diastolic congestive heart failure ([place])   Essential (hemorrhagic) thrombocythemia ([place])   Diverticulitis   Palliative care by specialist   End of life care   Hospital Course: 88 y.o. female with medical history significant of atrial fibrillation, CHF, aortic stenosis, hyperlipidemia, hypertension, hypothyroidism, RA, neuropathy, anemia, anxiety, essential thrombocythemia presenting with chest pain and presenting as STEMI.  Patient declined cardiac catheterization and any further intervention.  Palliative care team was consulted and she was transitioned to comfort care.   Assessment & Plan:  Principal Problem:   ACS (acute coronary syndrome) (HCC) Active Problems:   Hyperlipidemia   Hypothyroid   Persistent atrial fibrillation (HCC)   Benign essential hypertension   Rheumatoid arthritis (HCC)   Neuropathic pain   Anemia   Anxiety   Polyneuropathy   AKI (acute kidney injury) ([place])   Chronic combined systolic and diastolic congestive heart failure ([place])   Essential (hemorrhagic) thrombocythemia ([place])   Diverticulitis   Palliative care by specialist   End of life care     STEMI -Patient declined cardiac catheterization.  Initially started on medical management but later patient met with palliative care services and  she was transitioned to comfort care.     Acute congestive heart failure with reduced EF, 25% -Patient is now comfort care   AKI on CKD stage IIIa -Comfort care patient   Permanent atrial fibrillation: Rates controlled. -Medications discontinued.  She is comfort care   Neuropathy -Gabapentin stopped   Anemia of chronic disease: -Hemoglobin stable   Anxiety -Ativan as needed   Hyperkalemia: 5.4. -No further management planned as she is comfort care   Hypothyroidism, dementia-comfort care patient   Goals of care discussed by palliative care team patient has been transitioned to comfort care.     The results of significant diagnostics from this hospitalization (including imaging, microbiology, ancillary and laboratory) are listed below for reference.   Significant Diagnostic Studies: ECHOCARDIOGRAM COMPLETE  Result Date: 05/12/2022    ECHOCARDIOGRAM REPORT   Patient Name:   [name] Rodda Date of Exam: 05/12/2022 Medical Rec #:  [id number]           Height:       60.0 in Accession #:    [id number]          Weight:       127.9 lb Date of Birth:  [birth year]           BSA:          1.543 m Patient Age:    66 years            BP:           117/91 mmHg Patient Gender: F                   HR:  69 bpm. Exam Location:  Inpatient Procedure: 2D Echo, Cardiac Doppler, Color Doppler and Intracardiac            Opacification Agent Indications:    122-I22.9 Subsequent ST elevation (STEM) and non-ST elevation                 (NSTEMI) myocardial infarction  History:        Patient has prior history of Echocardiogram examinations, most                 recent 09/05/2019. CHF, Abnormal ECG, Aortic Valve Disease,                 Arrythmias:Atrial Fibrillation, Signs/Symptoms:Dyspnea,                 Shortness of Breath and Chest Pain; Risk Factors:Dyslipidemia                 and Hypertension. Aortic stenosis.  Sonographer:    Roseanna Rainbow RDCS Referring Phys: [id number] [place] Comments: Patient went into abnormal rhythm. Exam was paused, then when rhythm resolved Definity was given. Rapid response was notified. IMPRESSIONS  1. Left ventricular ejection fraction, by estimation, is 20 to 25%. The left ventricle has severely decreased function. The left ventricle has no regional wall motion abnormalities. There is mild left ventricular hypertrophy. Left ventricular diastolic parameters are indeterminate.  2. Right ventricular systolic function is normal. The right ventricular size is normal. There is severely elevated pulmonary artery systolic pressure. The estimated right ventricular systolic pressure is 44.9 mmHg.  3. Left atrial size was mildly dilated.  4. The mitral valve is normal in structure. Moderate mitral valve regurgitation. No evidence of mitral stenosis.  5. Tricuspid valve regurgitation is severe.  6. Left and non coronary cusp fusion of aortic valve. The aortic valve is normal in structure. There is moderate calcification of the aortic valve. There is moderate thickening of the aortic valve. Aortic valve regurgitation is not visualized. Aortic valve sclerosis/calcification is present, without any evidence of aortic stenosis.  7. The inferior vena cava is dilated in size with <50% respiratory variability, suggesting right atrial pressure of 15 mmHg. FINDINGS  Left Ventricle: Left ventricular ejection fraction, by estimation, is 20 to 25%. The left ventricle has severely decreased function. The left ventricle has no regional wall motion abnormalities. Definity contrast agent was given IV to delineate the left  ventricular endocardial borders. The left ventricular internal cavity size was normal in size. There is mild left ventricular hypertrophy. Left ventricular diastolic parameters are indeterminate.  LV Wall Scoring: The antero-lateral wall, apical lateral segment, apical septal segment, mid anterior segment, and apex are akinetic. Right Ventricle: The right  ventricular size is normal. No increase in right ventricular wall thickness. Right ventricular systolic function is normal. There is severely elevated pulmonary artery systolic pressure. The tricuspid regurgitant velocity is 3.58 m/s, and with an assumed right atrial pressure of 15 mmHg, the estimated right ventricular systolic pressure is 20.1 mmHg. Left Atrium: Left atrial size was mildly dilated. Right Atrium: Right atrial size was normal in size. Pericardium: There is no evidence of pericardial effusion. Mitral Valve: The mitral valve is normal in structure. Moderate mitral valve regurgitation. No evidence of mitral valve stenosis. MV peak gradient, 7.4 mmHg. The mean mitral valve gradient is 4.0 mmHg. Tricuspid Valve: The tricuspid valve is normal in structure. Tricuspid valve regurgitation is severe. No evidence of tricuspid stenosis. Aortic Valve: Left and non  coronary cusp fusion of aortic valve. The aortic valve is normal in structure. There is moderate calcification of the aortic valve. There is moderate thickening of the aortic valve. Aortic valve regurgitation is not visualized.  Aortic valve sclerosis/calcification is present, without any evidence of aortic stenosis. Aortic valve mean gradient measures 10.7 mmHg. Aortic valve peak gradient measures 18.2 mmHg. Aortic valve area, by VTI measures 1.79 cm. Pulmonic Valve: The pulmonic valve was normal in structure. Pulmonic valve regurgitation is trivial. No evidence of pulmonic stenosis. Aorta: The aortic root is normal in size and structure. Venous: The inferior vena cava is dilated in size with less than 50% respiratory variability, suggesting right atrial pressure of 15 mmHg. IAS/Shunts: No atrial level shunt detected by color flow Doppler.  LEFT VENTRICLE PLAX 2D LVIDd:         3.60 cm LVIDs:         2.75 cm LV PW:         1.30 cm LV IVS:        1.20 cm LVOT diam:     1.90 cm LV SV:         79 LV SV Index:   51 LVOT Area:     2.84 cm  LV Volumes  (MOD) LV vol d, MOD A2C: 78.3 ml LV vol d, MOD A4C: 79.7 ml LV vol s, MOD A2C: 50.5 ml LV vol s, MOD A4C: 64.4 ml LV SV MOD A2C:     27.8 ml LV SV MOD A4C:     79.7 ml LV SV MOD BP:      22.9 ml RIGHT VENTRICLE            IVC RV S prime:     5.18 cm/s  IVC diam: 2.00 cm TAPSE (M-mode): 0.7 cm LEFT ATRIUM             Index        RIGHT ATRIUM           Index LA diam:        4.50 cm 2.92 cm/m   RA Area:     18.20 cm LA Vol (A2C):   54.2 ml 35.12 ml/m  RA Volume:   48.60 ml  31.49 ml/m LA Vol (A4C):   58.4 ml 37.84 ml/m LA Biplane Vol: 59.1 ml 38.29 ml/m  AORTIC VALVE                     PULMONIC VALVE AV Area (Vmax):    2.05 cm      PR End Diast Vel: 2.10 msec AV Area (Vmean):   1.91 cm AV Area (VTI):     1.79 cm AV Vmax:           213.33 cm/s AV Vmean:          152.667 cm/s AV VTI:            0.442 m AV Peak Grad:      18.2 mmHg AV Mean Grad:      10.7 mmHg LVOT Vmax:         154.00 cm/s LVOT Vmean:        103.000 cm/s LVOT VTI:          0.280 m LVOT/AV VTI ratio: 0.63  AORTA Ao Asc diam: 2.90 cm MITRAL VALVE                  TRICUSPID VALVE MV Area (PHT): 4.85 cm  TR Peak grad:   51.3 mmHg MV Area VTI:   5.10 cm       TR Vmax:        358.00 cm/s MV Peak grad:  7.4 mmHg MV Mean grad:  4.0 mmHg       SHUNTS MV Vmax:       1.36 m/s       Systemic VTI:  0.28 m MV Vmean:      87.7 cm/s      Systemic Diam: 1.90 cm MV Decel Time: 157 msec MR Peak grad:    81.0 mmHg MR Mean grad:    52.0 mmHg MR Vmax:         450.00 cm/s MR Vmean:        332.0 cm/s MR PISA:         1.27 cm MR PISA Eff ROA: 11 mm MR PISA Radius:  0.45 cm MV E velocity: 116.50 cm/s Candee Furbish MD Electronically signed by Candee Furbish MD Signature Date/Time: 05/12/2022/1:10:17 PM    Final    CT CHEST ABDOMEN PELVIS WO CONTRAST  Result Date: 05/18/2022 CLINICAL DATA:  Aortic aneurysm, known or suspected r/o dissection vs aneurysm. has IV contrast allergy EXAM: CT CHEST, ABDOMEN AND PELVIS WITHOUT CONTRAST TECHNIQUE: Multidetector CT imaging  of the chest, abdomen and pelvis was performed following the standard protocol without IV contrast. RADIATION DOSE REDUCTION: This exam was performed according to the departmental dose-optimization program which includes automated exposure control, adjustment of the mA and/or kV according to patient size and/or use of iterative reconstruction technique. COMPARISON:  CT abdomen pelvis 08/31/2021 FINDINGS: CT CHEST FINDINGS Cardiovascular: Cardiomegaly with markedly dilated right atrium.No pericardial disease.Extensive coronary artery calcifications.Aortic valve calcifications. Moderate atherosclerosis of the thoracic aorta. Lack intravascular contrast limits evaluation of the thoracic aorta. There is no evidence of intramural hematoma or aneurysm. Mediastinum/Nodes: No lymphadenopathy.The thyroid is unremarkable.Large hiatal hernia, see discussion below in the abdomen section.No pneumomediastinum. Lungs/Pleura: Severe bronchial wall thickening bilaterally.Interlobular septal thickening with ground-glass opacities bilaterally. Moderate bilateral pleural effusions increased from prior exam. Adjacent atelectasis.No pneumothorax. Musculoskeletal: No acute osseous abnormality.No suspicious osseous lesion. Osteopenia. Multilevel degenerative changes of the spine. CT ABDOMEN PELVIS FINDINGS Hepatobiliary: No focal liver abnormality is seen. There is distension of the gallbladder with mild wall thickening. Pancreas: Unremarkable. No pancreatic ductal dilatation or surrounding inflammatory changes. Spleen: Normal in size without focal abnormality. Adrenals/Urinary Tract: Adrenal glands are unremarkable. There is unchanged left-sided hydroureteronephrosis, due to extensive inflammation related to sigmoid diverticulitis. No obstructing stone identified. There is mild bilateral renal atrophy. There is bladder wall thickening with moderate distension, predominantly filled with gas. Stomach/Bowel: There is a large hiatal hernia  with partial intrathoracic stomach. There is unchanged organo-axial rotation with increased distension since the prior exam but no evidence of gastric wall thickening or adjacent inflammation. Small bowel is decompressed. There is extensive colonic diverticulosis. There is similar severe sigmoid colon wall thickening with extensive adjacent inflammatory stranding, with adjacent soft tissue thickening and gas and fluid, consistent with colovesical fistula. There is intervening gas/fluid collection measures approximately 6.8 x 2.9 cm, previously 6.8 2.5 cm, with increased gas in the collection in comparison to prior.Prior appendectomy, per medical record. Vascular/Lymphatic: Aortoiliac atherosclerosis. No AAA. No lymphadenopathy. Reproductive: Unremarkable. Other: Tiny fat containing umbilical hernia. Small fat containing inguinal hernias. No bowel containing hernia. Extensive pelvic inflammation. Generalized edematous appearance in the abdomen. Musculoskeletal: Moderate body wall edema, increased from prior. There is no acute osseous abnormality. No suspicious osseous lesion.  IMPRESSION: Severe chronic sigmoid diverticulitis with colovesical fistula and unchanged associated cystitis and moderate left-sided hydroureteronephrosis. Large hiatal hernia with partially intrathoracic stomach. Similar organo-axial rotation but with markedly increased gastric distension in comparison to prior. No wall thickening or adjacent stranding to suggest an obstructing volvulus at this time. Recommend close clinical attention. Mild gallbladder distension with wall thickening, nonspecific. Moderate pulmonary edema with moderate-sized bilateral pleural effusions, increased on the right in comparison to prior. Unchanged cardiomegaly. Extensive atherosclerosis of the aorta without evidence of aneurysm or intramural hematoma. Lack of intravascular contrast limits evaluation for dissection. Electronically Signed   By: Maurine Simmering M.D.   On:  05/02/2022 17:37   DG Chest Port 1 View  Result Date: 05/29/2022 CLINICAL DATA:  Chest pain EXAM: PORTABLE CHEST 1 VIEW COMPARISON:  Chest x-ray dated January 04, 2022 FINDINGS: Unchanged cardiomegaly. Large lucency overlying the cardiac shadow. Mild bibasilar lung opacities, likely due to atelectasis. Possible trace bilateral pleural effusions. No large pleural effusion or evidence of pneumothorax. IMPRESSION: Large lucency overlying the cardiac shadow, compatible with large hiatal hernia which appears increased in size when compared with prior exams. Given history of chest pain, gastric obstruction is a concern. Recommend contrast-enhanced CT of the chest, abdomen and pelvis for further evaluation. Electronically Signed   By: Yetta Glassman M.D.   On: 05/14/2022 13:08    Microbiology: No results found for this or any previous visit (from the past 240 hour(s)).  Time spent: 20 minutes  Signed: Consuelo Thayne Arsenio Loader, MD 2022/06/10

## 2022-05-30 NOTE — Progress Notes (Signed)
Patient passed away tonight at Jan 28, 1907 and was pronounced dead by Kandis Fantasia, RN and Veneta Penton, RN. Patient 's Dilaudid drip was turned off at 01-29-11 and NS infusing at 89m/hr. Her son MLegrand Comowas called at home number and [employer] number. No answers, so message was left to [facility] Waiting to hear from her son. MD C. Hall on call for [facility] was notified of patient's death.

## 2022-05-30 NOTE — Progress Notes (Signed)
PROGRESS NOTE    [name]  MRN:[id number] DOB: 07/02/1927 DOA: 05/08/2022 PCP: Nche, [name] Lum, NP   Brief Narrative:   88 y.o. female with medical history significant of atrial fibrillation, CHF, aortic stenosis, hyperlipidemia, hypertension, hypothyroidism, RA, neuropathy, anemia, anxiety, essential thrombocythemia presenting with chest pain and presenting as STEMI.  Patient declined cardiac catheterization and any further intervention.  Palliative care team was consulted and she was transitioned to comfort care.  Assessment & Plan:  Principal Problem:   ACS (acute coronary syndrome) (HCC) Active Problems:   Hyperlipidemia   Hypothyroid   Persistent atrial fibrillation (HCC)   Benign essential hypertension   Rheumatoid arthritis (HCC)   Neuropathic pain   Anemia   Anxiety   Polyneuropathy   AKI (acute kidney injury) (HCC)   Chronic combined systolic and diastolic congestive heart failure (HCC)   Essential (hemorrhagic) thrombocythemia (HCC)   Diverticulitis   Palliative care by specialist   End of life care     STEMI -Patient declined cardiac catheterization.  Initially started on medical management but later patient met with palliative care services and she was transitioned to comfort care.    Acute congestive heart failure with reduced EF, 25% -Patient is now comfort care   AKI on CKD stage IIIa -Comfort care patient   Permanent atrial fibrillation: Rates controlled. -Medications discontinued.  She is comfort care   Neuropathy -Gabapentin stopped   Anemia of chronic disease: -Hemoglobin stable   Anxiety -Ativan as needed   Hyperkalemia: 5.4. -No further management planned as she is comfort care  Hypothyroidism, dementia-comfort care patient   Goals of care discussed by palliative care team patient has been transitioned to comfort care.      DVT prophylaxis: None Code Status: DNR Family Communication:        Subjective: Patient  had r rapid response yesterday with wide-complex tachycardia.  She is currently comfort care  No complaints resting comfortably.    Examination:  Constitutional: Not in acute distress; chronically ill appearing.  Respiratory: Clear to auscultation bilaterally Cardiovascular: Normal sinus rhythm, no rubs Abdomen: Nontender nondistended good bowel sounds Musculoskeletal: No edema noted Skin: No rashes seen Neurologic: grossly moving all extremities.  Psychiatric: unable to asasess     Objective: Vitals:   05/12/22 0453 05/12/22 0831 05/12/22 1103 05/12/22 1207  BP: 110/68 (!) 117/91 101/85 102/65  Pulse:  94 (!) 133 100  Resp:  18 (!) 27 17  Temp: 97.6 F (36.4 C) 97.6 F (36.4 C)  (!) 97.5 F (36.4 C)  TempSrc: Oral Oral  Oral  SpO2:  99% 99% (!) 89%  Weight: 58 kg     Height:       No intake or output data in the 24 hours ending 05/10/2022 0737 Filed Weights   05/05/2022 1215 05/05/2022 2147 05/12/22 0453  Weight: 50.3 kg 58.1 kg 58 kg     Data Reviewed:   CBC: Recent Labs  Lab 05/27/2022 1335 05/10/2022 1354 05/12/22 0211  WBC 13.9*  --  10.9*  NEUTROABS 11.2*  --   --   HGB 10.2* 11.2* 10.4*  HCT 34.1* 33.0* 34.0*  MCV 91.4  --  87.6  PLT 367  --  343   Basic Metabolic Panel: Recent Labs  Lab 05/04/2022 1335 05/28/2022 1354 05/18/2022 2106 05/12/22 0211  NA 137 136  --  136  K 4.2 4.1  --  5.4*  CL 101 103  --  101  CO2 23  --   --    23  GLUCOSE 139* 133*  --  156*  BUN 16 16  --  20  CREATININE 1.36* 1.30*  --  1.74*  CALCIUM 8.5*  --   --  8.5*  MG  --   --  1.8 1.8   GFR: Estimated Creatinine Clearance: 16.4 mL/min (A) (by C-G formula based on SCr of 1.74 mg/dL (H)). Liver Function Tests: Recent Labs  Lab 05/12/2022 1335  AST 35  ALT 11  ALKPHOS 91  BILITOT 1.0  PROT 7.3  ALBUMIN 2.7*   No results for input(s): "LIPASE", "AMYLASE" in the last 168 hours. No results for input(s): "AMMONIA" in the last 168 hours. Coagulation Profile: Recent Labs   Lab 05/06/2022 1232 05/12/2022 1455  INR >10.0* 1.2   Cardiac Enzymes: No results for input(s): "CKTOTAL", "CKMB", "CKMBINDEX", "TROPONINI" in the last 168 hours. BNP (last 3 results) Recent Labs    04/03/22 1525  PROBNP 780.0*   HbA1C: Recent Labs    05/10/2022 1335  HGBA1C 5.6   CBG: No results for input(s): "GLUCAP" in the last 168 hours. Lipid Profile: Recent Labs    05/12/2022 1335  CHOL 129  HDL 40*  LDLCALC 72  TRIG 84  CHOLHDL 3.2   Thyroid Function Tests: Recent Labs    05/17/2022 2134  TSH 7.866*   Anemia Panel: No results for input(s): "VITAMINB12", "FOLATE", "FERRITIN", "TIBC", "IRON", "RETICCTPCT" in the last 72 hours. Sepsis Labs: No results for input(s): "PROCALCITON", "LATICACIDVEN" in the last 168 hours.  No results found for this or any previous visit (from the past 240 hour(s)).       Radiology Studies: ECHOCARDIOGRAM COMPLETE  Result Date: 05/12/2022    ECHOCARDIOGRAM REPORT   Patient Name:   [name] Date of Exam: 05/12/2022 Medical Rec #:  [id number]           Height:       60.0 in Accession #:    [id number]          Weight:       127.9 lb Date of Birth:  Sep 17, 1927           BSA:          1.543 m Patient Age:    3 years            BP:           117/91 mmHg Patient Gender: F                   HR:           69 bpm. Exam Location:  Inpatient Procedure: 2D Echo, Cardiac Doppler, Color Doppler and Intracardiac            Opacification Agent Indications:    122-I22.9 Subsequent ST elevation (STEM) and non-ST elevation                 (NSTEMI) myocardial infarction  History:        Patient has prior history of Echocardiogram examinations, most                 recent 09/05/2019. CHF, Abnormal ECG, Aortic Valve Disease,                 Arrythmias:Atrial Fibrillation, Signs/Symptoms:Dyspnea,                 Shortness of Breath and Chest Pain; Risk Factors:Dyslipidemia  and Hypertension. Aortic stenosis.  Sonographer:    Tina West RDCS  Referring Phys: [id number] ALEXANDER B MELVIN  Sonographer Comments: Patient went into abnormal rhythm. Exam was paused, then when rhythm resolved Definity was given. Rapid response was notified. IMPRESSIONS  1. Left ventricular ejection fraction, by estimation, is 20 to 25%. The left ventricle has severely decreased function. The left ventricle has no regional wall motion abnormalities. There is mild left ventricular hypertrophy. Left ventricular diastolic parameters are indeterminate.  2. Right ventricular systolic function is normal. The right ventricular size is normal. There is severely elevated pulmonary artery systolic pressure. The estimated right ventricular systolic pressure is 66.3 mmHg.  3. Left atrial size was mildly dilated.  4. The mitral valve is normal in structure. Moderate mitral valve regurgitation. No evidence of mitral stenosis.  5. Tricuspid valve regurgitation is severe.  6. Left and non coronary cusp fusion of aortic valve. The aortic valve is normal in structure. There is moderate calcification of the aortic valve. There is moderate thickening of the aortic valve. Aortic valve regurgitation is not visualized. Aortic valve sclerosis/calcification is present, without any evidence of aortic stenosis.  7. The inferior vena cava is dilated in size with <50% respiratory variability, suggesting right atrial pressure of 15 mmHg. FINDINGS  Left Ventricle: Left ventricular ejection fraction, by estimation, is 20 to 25%. The left ventricle has severely decreased function. The left ventricle has no regional wall motion abnormalities. Definity contrast agent was given IV to delineate the left  ventricular endocardial borders. The left ventricular internal cavity size was normal in size. There is mild left ventricular hypertrophy. Left ventricular diastolic parameters are indeterminate.  LV Wall Scoring: The antero-lateral wall, apical lateral segment, apical septal segment, mid anterior segment, and apex  are akinetic. Right Ventricle: The right ventricular size is normal. No increase in right ventricular wall thickness. Right ventricular systolic function is normal. There is severely elevated pulmonary artery systolic pressure. The tricuspid regurgitant velocity is 3.58 m/s, and with an assumed right atrial pressure of 15 mmHg, the estimated right ventricular systolic pressure is 66.3 mmHg. Left Atrium: Left atrial size was mildly dilated. Right Atrium: Right atrial size was normal in size. Pericardium: There is no evidence of pericardial effusion. Mitral Valve: The mitral valve is normal in structure. Moderate mitral valve regurgitation. No evidence of mitral valve stenosis. MV peak gradient, 7.4 mmHg. The mean mitral valve gradient is 4.0 mmHg. Tricuspid Valve: The tricuspid valve is normal in structure. Tricuspid valve regurgitation is severe. No evidence of tricuspid stenosis. Aortic Valve: Left and non coronary cusp fusion of aortic valve. The aortic valve is normal in structure. There is moderate calcification of the aortic valve. There is moderate thickening of the aortic valve. Aortic valve regurgitation is not visualized.  Aortic valve sclerosis/calcification is present, without any evidence of aortic stenosis. Aortic valve mean gradient measures 10.7 mmHg. Aortic valve peak gradient measures 18.2 mmHg. Aortic valve area, by VTI measures 1.79 cm. Pulmonic Valve: The pulmonic valve was normal in structure. Pulmonic valve regurgitation is trivial. No evidence of pulmonic stenosis. Aorta: The aortic root is normal in size and structure. Venous: The inferior vena cava is dilated in size with less than 50% respiratory variability, suggesting right atrial pressure of 15 mmHg. IAS/Shunts: No atrial level shunt detected by color flow Doppler.  LEFT VENTRICLE PLAX 2D LVIDd:         3.60 cm LVIDs:         2.75 cm LV PW:           1.30 cm LV IVS:        1.20 cm LVOT diam:     1.90 cm LV SV:         79 LV SV Index:   51  LVOT Area:     2.84 cm  LV Volumes (MOD) LV vol d, MOD A2C: 78.3 ml LV vol d, MOD A4C: 79.7 ml LV vol s, MOD A2C: 50.5 ml LV vol s, MOD A4C: 64.4 ml LV SV MOD A2C:     27.8 ml LV SV MOD A4C:     79.7 ml LV SV MOD BP:      22.9 ml RIGHT VENTRICLE            IVC RV S prime:     5.18 cm/s  IVC diam: 2.00 cm TAPSE (M-mode): 0.7 cm LEFT ATRIUM             Index        RIGHT ATRIUM           Index LA diam:        4.50 cm 2.92 cm/m   RA Area:     18.20 cm LA Vol (A2C):   54.2 ml 35.12 ml/m  RA Volume:   48.60 ml  31.49 ml/m LA Vol (A4C):   58.4 ml 37.84 ml/m LA Biplane Vol: 59.1 ml 38.29 ml/m  AORTIC VALVE                     PULMONIC VALVE AV Area (Vmax):    2.05 cm      PR End Diast Vel: 2.10 msec AV Area (Vmean):   1.91 cm AV Area (VTI):     1.79 cm AV Vmax:           213.33 cm/s AV Vmean:          152.667 cm/s AV VTI:            0.442 m AV Peak Grad:      18.2 mmHg AV Mean Grad:      10.7 mmHg LVOT Vmax:         154.00 cm/s LVOT Vmean:        103.000 cm/s LVOT VTI:          0.280 m LVOT/AV VTI ratio: 0.63  AORTA Ao Asc diam: 2.90 cm MITRAL VALVE                  TRICUSPID VALVE MV Area (PHT): 4.85 cm       TR Peak grad:   51.3 mmHg MV Area VTI:   5.10 cm       TR Vmax:        358.00 cm/s MV Peak grad:  7.4 mmHg MV Mean grad:  4.0 mmHg       SHUNTS MV Vmax:       1.36 m/s       Systemic VTI:  0.28 m MV Vmean:      87.7 cm/s      Systemic Diam: 1.90 cm MV Decel Time: 157 msec MR Peak grad:    81.0 mmHg MR Mean grad:    52.0 mmHg MR Vmax:         450.00 cm/s MR Vmean:        332.0 cm/s MR PISA:         1.27 cm MR PISA Eff ROA: 11 mm MR PISA Radius:  0.45 cm MV E velocity: 116.50 cm/s [employer]  MD Electronically signed by Mark Skains MD Signature Date/Time: 05/12/2022/1:10:17 PM    Final    CT CHEST ABDOMEN PELVIS WO CONTRAST  Result Date: 05/25/2022 CLINICAL DATA:  Aortic aneurysm, known or suspected r/o dissection vs aneurysm. has IV contrast allergy EXAM: CT CHEST, ABDOMEN AND PELVIS WITHOUT CONTRAST  TECHNIQUE: Multidetector CT imaging of the chest, abdomen and pelvis was performed following the standard protocol without IV contrast. RADIATION DOSE REDUCTION: This exam was performed according to the departmental dose-optimization program which includes automated exposure control, adjustment of the mA and/or kV according to patient size and/or use of iterative reconstruction technique. COMPARISON:  CT abdomen pelvis 08/31/2021 FINDINGS: CT CHEST FINDINGS Cardiovascular: Cardiomegaly with markedly dilated right atrium.No pericardial disease.Extensive coronary artery calcifications.Aortic valve calcifications. Moderate atherosclerosis of the thoracic aorta. Lack intravascular contrast limits evaluation of the thoracic aorta. There is no evidence of intramural hematoma or aneurysm. Mediastinum/Nodes: No lymphadenopathy.The thyroid is unremarkable.Large hiatal hernia, see discussion below in the abdomen section.No pneumomediastinum. Lungs/Pleura: Severe bronchial wall thickening bilaterally.Interlobular septal thickening with ground-glass opacities bilaterally. Moderate bilateral pleural effusions increased from prior exam. Adjacent atelectasis.No pneumothorax. Musculoskeletal: No acute osseous abnormality.No suspicious osseous lesion. Osteopenia. Multilevel degenerative changes of the spine. CT ABDOMEN PELVIS FINDINGS Hepatobiliary: No focal liver abnormality is seen. There is distension of the gallbladder with mild wall thickening. Pancreas: Unremarkable. No pancreatic ductal dilatation or surrounding inflammatory changes. Spleen: Normal in size without focal abnormality. Adrenals/Urinary Tract: Adrenal glands are unremarkable. There is unchanged left-sided hydroureteronephrosis, due to extensive inflammation related to sigmoid diverticulitis. No obstructing stone identified. There is mild bilateral renal atrophy. There is bladder wall thickening with moderate distension, predominantly filled with gas.  Stomach/Bowel: There is a large hiatal hernia with partial intrathoracic stomach. There is unchanged organo-axial rotation with increased distension since the prior exam but no evidence of gastric wall thickening or adjacent inflammation. Small bowel is decompressed. There is extensive colonic diverticulosis. There is similar severe sigmoid colon wall thickening with extensive adjacent inflammatory stranding, with adjacent soft tissue thickening and gas and fluid, consistent with colovesical fistula. There is intervening gas/fluid collection measures approximately 6.8 x 2.9 cm, previously 6.8 2.5 cm, with increased gas in the collection in comparison to prior.Prior appendectomy, per medical record. Vascular/Lymphatic: Aortoiliac atherosclerosis. No AAA. No lymphadenopathy. Reproductive: Unremarkable. Other: Tiny fat containing umbilical hernia. Small fat containing inguinal hernias. No bowel containing hernia. Extensive pelvic inflammation. Generalized edematous appearance in the abdomen. Musculoskeletal: Moderate body wall edema, increased from prior. There is no acute osseous abnormality. No suspicious osseous lesion. IMPRESSION: Severe chronic sigmoid diverticulitis with colovesical fistula and unchanged associated cystitis and moderate left-sided hydroureteronephrosis. Large hiatal hernia with partially intrathoracic stomach. Similar organo-axial rotation but with markedly increased gastric distension in comparison to prior. No wall thickening or adjacent stranding to suggest an obstructing volvulus at this time. Recommend close clinical attention. Mild gallbladder distension with wall thickening, nonspecific. Moderate pulmonary edema with moderate-sized bilateral pleural effusions, increased on the right in comparison to prior. Unchanged cardiomegaly. Extensive atherosclerosis of the aorta without evidence of aneurysm or intramural hematoma. Lack of intravascular contrast limits evaluation for dissection.  Electronically Signed   By: Jacob  Kahn M.D.   On: 05/16/2022 17:37   DG Chest Port 1 View  Result Date: 05/18/2022 CLINICAL DATA:  Chest pain EXAM: PORTABLE CHEST 1 VIEW COMPARISON:  Chest x-ray dated January 04, 2022 FINDINGS: Unchanged cardiomegaly. Large lucency overlying the cardiac shadow. Mild bibasilar lung opacities, likely due to atelectasis. Possible trace bilateral pleural   effusions. No large pleural effusion or evidence of pneumothorax. IMPRESSION: Large lucency overlying the cardiac shadow, compatible with large hiatal hernia which appears increased in size when compared with prior exams. Given history of chest pain, gastric obstruction is a concern. Recommend contrast-enhanced CT of the chest, abdomen and pelvis for further evaluation. Electronically Signed   By: Leah  Strickland M.D.   On: 05/25/2022 13:08        Scheduled Meds:  Continuous Infusions:  sodium chloride 20 mL/hr at 05/12/22 0324   HYDROmorphone 1 mg/hr (05/18/2022 0023)     LOS: 2 days   Time spent= 35 mins     Chirag , MD [facility]  If 7PM-7AM, please contact night-coverage  05/29/2022, 7:37 AM   

## 2022-05-30 NOTE — Progress Notes (Signed)
Patient had Dilaudid gtt infusing and it was turned off at patient's time of death. The wasted amount was 18m witnessed by HHarle Stanford RN. Amount was wasted in steri container. All tubing was disposed in proper tubing disposal container in Pyxis room.

## 2022-05-30 DEATH — deceased
